# Patient Record
Sex: Male | Born: 1935
Health system: Southern US, Community
[De-identification: ages and names within clinical notes are randomized; demographics above are authoritative.]

## PROBLEM LIST (undated history)

## (undated) DIAGNOSIS — E875 Hyperkalemia: Secondary | ICD-10-CM

## (undated) DIAGNOSIS — K759 Inflammatory liver disease, unspecified: Secondary | ICD-10-CM

## (undated) DIAGNOSIS — I472 Ventricular tachycardia: Secondary | ICD-10-CM

## (undated) DIAGNOSIS — K2270 Barrett's esophagus without dysplasia: Secondary | ICD-10-CM

## (undated) DIAGNOSIS — R29898 Other symptoms and signs involving the musculoskeletal system: Secondary | ICD-10-CM

## (undated) DIAGNOSIS — K219 Gastro-esophageal reflux disease without esophagitis: Secondary | ICD-10-CM

## (undated) DIAGNOSIS — R7303 Prediabetes: Secondary | ICD-10-CM

## (undated) DIAGNOSIS — Z9289 Personal history of other medical treatment: Secondary | ICD-10-CM

## (undated) DIAGNOSIS — I10 Essential (primary) hypertension: Secondary | ICD-10-CM

## (undated) DIAGNOSIS — I951 Orthostatic hypotension: Secondary | ICD-10-CM

## (undated) DIAGNOSIS — E7800 Pure hypercholesterolemia, unspecified: Secondary | ICD-10-CM

## (undated) DIAGNOSIS — N183 Chronic kidney disease, stage 3 (moderate): Secondary | ICD-10-CM

## (undated) DIAGNOSIS — Z8719 Personal history of other diseases of the digestive system: Secondary | ICD-10-CM

## (undated) DIAGNOSIS — E042 Nontoxic multinodular goiter: Secondary | ICD-10-CM

## (undated) DIAGNOSIS — E0590 Thyrotoxicosis, unspecified without thyrotoxic crisis or storm: Secondary | ICD-10-CM

## (undated) DIAGNOSIS — B029 Zoster without complications: Secondary | ICD-10-CM

## (undated) DIAGNOSIS — Z8679 Personal history of other diseases of the circulatory system: Secondary | ICD-10-CM

## (undated) DIAGNOSIS — Z952 Presence of prosthetic heart valve: Secondary | ICD-10-CM

## (undated) DIAGNOSIS — J45909 Unspecified asthma, uncomplicated: Secondary | ICD-10-CM

## (undated) DIAGNOSIS — R0600 Dyspnea, unspecified: Secondary | ICD-10-CM

## (undated) DIAGNOSIS — I5042 Chronic combined systolic (congestive) and diastolic (congestive) heart failure: Secondary | ICD-10-CM

## (undated) DIAGNOSIS — M1990 Unspecified osteoarthritis, unspecified site: Secondary | ICD-10-CM

## (undated) DIAGNOSIS — Z7901 Long term (current) use of anticoagulants: Secondary | ICD-10-CM

## (undated) DIAGNOSIS — I2510 Atherosclerotic heart disease of native coronary artery without angina pectoris: Secondary | ICD-10-CM

## (undated) DIAGNOSIS — Z95810 Presence of automatic (implantable) cardiac defibrillator: Secondary | ICD-10-CM

## (undated) DIAGNOSIS — E785 Hyperlipidemia, unspecified: Secondary | ICD-10-CM

## (undated) DIAGNOSIS — I4819 Other persistent atrial fibrillation: Secondary | ICD-10-CM

## (undated) DIAGNOSIS — J189 Pneumonia, unspecified organism: Secondary | ICD-10-CM

## (undated) DIAGNOSIS — I420 Dilated cardiomyopathy: Secondary | ICD-10-CM

## (undated) DIAGNOSIS — E43 Unspecified severe protein-calorie malnutrition: Secondary | ICD-10-CM

## (undated) DIAGNOSIS — K922 Gastrointestinal hemorrhage, unspecified: Secondary | ICD-10-CM

## (undated) DIAGNOSIS — T8859XA Other complications of anesthesia, initial encounter: Secondary | ICD-10-CM

## (undated) DIAGNOSIS — J90 Pleural effusion, not elsewhere classified: Secondary | ICD-10-CM

## (undated) HISTORY — DX: Presence of prosthetic heart valve: Z95.2

## (undated) HISTORY — DX: Ventricular tachycardia: I47.2

## (undated) HISTORY — DX: Presence of automatic (implantable) cardiac defibrillator: Z95.810

## (undated) HISTORY — DX: Pure hypercholesterolemia, unspecified: E78.00

## (undated) HISTORY — PX: ESOPHAGOGASTRODUODENOSCOPY (EGD) WITH ESOPHAGEAL DILATION: SHX5812

## (undated) HISTORY — DX: Nontoxic multinodular goiter: E04.2

## (undated) HISTORY — PX: CARDIAC VALVE REPLACEMENT: SHX585

## (undated) HISTORY — PX: CATARACT EXTRACTION W/ INTRAOCULAR LENS  IMPLANT, BILATERAL: SHX1307

## (undated) HISTORY — DX: Other persistent atrial fibrillation: I48.19

## (undated) HISTORY — DX: Long term (current) use of anticoagulants: Z79.01

## (undated) HISTORY — DX: Zoster without complications: B02.9

## (undated) HISTORY — DX: Unspecified severe protein-calorie malnutrition: E43

## (undated) HISTORY — DX: Hyperlipidemia, unspecified: E78.5

## (undated) HISTORY — PX: AORTIC VALVE REPLACEMENT: SHX41

## (undated) HISTORY — DX: Other symptoms and signs involving the musculoskeletal system: R29.898

## (undated) HISTORY — DX: Orthostatic hypotension: I95.1

## (undated) HISTORY — DX: Atherosclerotic heart disease of native coronary artery without angina pectoris: I25.10

## (undated) HISTORY — DX: Dilated cardiomyopathy: I42.0

## (undated) HISTORY — DX: Personal history of other medical treatment: Z92.89

## (undated) HISTORY — DX: Essential (primary) hypertension: I10

## (undated) HISTORY — DX: Hyperkalemia: E87.5

## (undated) HISTORY — DX: Chronic kidney disease, stage 3 (moderate): N18.3

## (undated) HISTORY — PX: TONSILLECTOMY: SUR1361

## (undated) HISTORY — DX: Barrett's esophagus without dysplasia: K22.70

## (undated) HISTORY — DX: Personal history of other diseases of the circulatory system: Z86.79

## (undated) HISTORY — DX: Thyrotoxicosis, unspecified without thyrotoxic crisis or storm: E05.90

## (undated) HISTORY — DX: Pleural effusion, not elsewhere classified: J90

## (undated) HISTORY — DX: Gastrointestinal hemorrhage, unspecified: K92.2

## (undated) HISTORY — PX: APPENDECTOMY: SHX54

## (undated) HISTORY — DX: Chronic combined systolic (congestive) and diastolic (congestive) heart failure: I50.42

---
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
## 2000-05-27 ENCOUNTER — Encounter (INDEPENDENT_AMBULATORY_CARE_PROVIDER_SITE_OTHER): Payer: Self-pay | Admitting: Specialist

## 2000-05-27 ENCOUNTER — Ambulatory Visit (HOSPITAL_COMMUNITY): Admission: RE | Admit: 2000-05-27 | Discharge: 2000-05-27 | Payer: Self-pay | Admitting: *Deleted

## 2002-09-30 ENCOUNTER — Encounter (INDEPENDENT_AMBULATORY_CARE_PROVIDER_SITE_OTHER): Payer: Self-pay | Admitting: *Deleted

## 2002-09-30 ENCOUNTER — Ambulatory Visit (HOSPITAL_COMMUNITY): Admission: RE | Admit: 2002-09-30 | Discharge: 2002-09-30 | Payer: Self-pay | Admitting: *Deleted

## 2004-10-26 ENCOUNTER — Emergency Department (HOSPITAL_COMMUNITY): Admission: EM | Admit: 2004-10-26 | Discharge: 2004-10-27 | Payer: Self-pay | Admitting: Emergency Medicine

## 2006-04-21 ENCOUNTER — Emergency Department (HOSPITAL_COMMUNITY): Admission: EM | Admit: 2006-04-21 | Discharge: 2006-04-21 | Payer: Self-pay | Admitting: Emergency Medicine

## 2007-04-10 DIAGNOSIS — Z952 Presence of prosthetic heart valve: Secondary | ICD-10-CM

## 2007-04-10 HISTORY — DX: Presence of prosthetic heart valve: Z95.2

## 2008-02-19 ENCOUNTER — Encounter: Admission: RE | Admit: 2008-02-19 | Discharge: 2008-02-19 | Payer: Self-pay | Admitting: Cardiology

## 2008-02-23 ENCOUNTER — Inpatient Hospital Stay (HOSPITAL_BASED_OUTPATIENT_CLINIC_OR_DEPARTMENT_OTHER): Admission: RE | Admit: 2008-02-23 | Discharge: 2008-02-23 | Payer: Self-pay | Admitting: Cardiology

## 2008-02-23 ENCOUNTER — Ambulatory Visit (HOSPITAL_COMMUNITY): Admission: RE | Admit: 2008-02-23 | Discharge: 2008-02-23 | Payer: Self-pay | Admitting: Cardiology

## 2008-02-23 ENCOUNTER — Encounter (INDEPENDENT_AMBULATORY_CARE_PROVIDER_SITE_OTHER): Payer: Self-pay | Admitting: Cardiology

## 2008-02-23 ENCOUNTER — Ambulatory Visit: Payer: Self-pay | Admitting: Vascular Surgery

## 2008-02-27 ENCOUNTER — Ambulatory Visit: Payer: Self-pay | Admitting: Thoracic Surgery (Cardiothoracic Vascular Surgery)

## 2008-03-10 ENCOUNTER — Ambulatory Visit: Payer: Self-pay | Admitting: Thoracic Surgery (Cardiothoracic Vascular Surgery)

## 2008-03-10 ENCOUNTER — Inpatient Hospital Stay (HOSPITAL_COMMUNITY)
Admission: RE | Admit: 2008-03-10 | Discharge: 2008-03-22 | Payer: Self-pay | Admitting: Thoracic Surgery (Cardiothoracic Vascular Surgery)

## 2008-03-10 ENCOUNTER — Encounter: Payer: Self-pay | Admitting: Thoracic Surgery (Cardiothoracic Vascular Surgery)

## 2008-03-10 HISTORY — PX: CORONARY ARTERY BYPASS GRAFT: SHX141

## 2008-04-06 ENCOUNTER — Inpatient Hospital Stay (HOSPITAL_COMMUNITY): Admission: EM | Admit: 2008-04-06 | Discharge: 2008-04-08 | Payer: Self-pay | Admitting: Emergency Medicine

## 2008-04-12 ENCOUNTER — Ambulatory Visit: Payer: Self-pay | Admitting: Thoracic Surgery (Cardiothoracic Vascular Surgery)

## 2008-04-12 ENCOUNTER — Encounter
Admission: RE | Admit: 2008-04-12 | Discharge: 2008-04-12 | Payer: Self-pay | Admitting: Thoracic Surgery (Cardiothoracic Vascular Surgery)

## 2008-04-19 ENCOUNTER — Encounter
Admission: RE | Admit: 2008-04-19 | Discharge: 2008-04-19 | Payer: Self-pay | Admitting: Thoracic Surgery (Cardiothoracic Vascular Surgery)

## 2008-04-19 ENCOUNTER — Ambulatory Visit: Payer: Self-pay | Admitting: Thoracic Surgery (Cardiothoracic Vascular Surgery)

## 2008-04-23 ENCOUNTER — Ambulatory Visit: Payer: Self-pay | Admitting: Cardiothoracic Surgery

## 2008-04-23 ENCOUNTER — Encounter: Admission: RE | Admit: 2008-04-23 | Discharge: 2008-04-23 | Payer: Self-pay | Admitting: Cardiothoracic Surgery

## 2008-05-25 ENCOUNTER — Ambulatory Visit: Payer: Self-pay | Admitting: Thoracic Surgery (Cardiothoracic Vascular Surgery)

## 2008-05-25 ENCOUNTER — Encounter
Admission: RE | Admit: 2008-05-25 | Discharge: 2008-05-25 | Payer: Self-pay | Admitting: Thoracic Surgery (Cardiothoracic Vascular Surgery)

## 2010-08-22 NOTE — Assessment & Plan Note (Signed)
OFFICE VISIT   CHUKWUKA, FESTA  DOB:  1932-05-02                                        April 12, 2008  CHART #:  [id number]   REASON FOR OFFICE VISIT:  Status post AVR and CABG x3 on 03/10/2008 with  Dr. Roxan Hockey.   HISTORY OF PRESENTING ILLNESS:  This is a 77 year old Caucasian male  with a history of rheumatic fever as well as aortic stenosis,  hypertension, and hypercholesterolemia.  He underwent an AVR (23-Magna  Ease pericardial valve) and CABG x3 by Dr. Roxan Hockey on 03/10/2008.  The patient was discharged from the hospital.  He was seen in Dr.  Theodosia Blender office on 04/06/2008 where he had complaints of a productive  cough and fevers with worsening discomfort and chills as well as  shortness of breath.  He was readmitted to [facility] for further  evaluation and treatment.  A chest x-ray was done on 04/06/2008, which  showed a worsening left pleural effusion (moderate in size).  The  patient now underwent a thoracentesis.  The patient was then discharged  shortly thereafter.  The patient states his only complaint at this time  is feeling fatigued and weak.  He denies any cough, shortness of breath,  chest pain, fever, or chills.   PHYSICAL EXAMINATION:  GENERAL:  BP is 99/68, pulse rate 72, respiration  rate 16, and O2 sat 96% on room air.  CARDIOVASCULAR:  Regular rate and rhythm, S1 and S2.  No murmurs,  gallops, or rubs.  PULMONARY:  Slightly diminished at left base, but otherwise clear.  No  rales, wheezes, or rhonchi.  EXTREMITIES:  No cyanosis, clubbing, or edema.  WOUNDS:  Sternum clean and dry.  Right lower extremity wound clean and  dry.  A 2 or 3 chest tube incisions well healed.  The wound on the far  right is opened slightly with some clear ooze.  This was cleaned and a  dry gauze and tape were placed over wound.   Chest x-ray done today show a small residual left pleural effusion, the  previous infiltrate in the lingula has  resolved, improved atelectasis of  the left lower lobe.  Right lung is clear.   IMPRESSION AND PLAN:  The patient has a followup appointment to see Dr.  Radford Pax this afternoon.  The patient has been contacted by cardiac  rehabilitation.  He was instructed that from a surgical standpoint.  He  may participate in cardiac rehabilitation and was encouraged to do so.  In the meantime, he is to continue walking every day and increase his  frequency and duration as tolerates.  He was also instructed to remove  the dressing of the far right chest tube wound.  He was instructed he  may use a Band-Aid and change this daily into the wound if healed.  He  was instructed to call the office if there is any redness, increased  drainage, tenderness, fever, or chills.  The patient will return to see  Dr. Roxan Hockey in 1-2 weeks with a chest x-ray to evaluate left pleural  effusion and ensure that there has been no reaccumulation.  The patient  was also instructed he may begin driving short distances during the  daytime.  He was also instructed to not lift more than 10-15 pounds for  the next 2-3 weeks.  In addition, the patient is on Coumadin and Home  Health has been drawing this and Cardiology is following and adjusting  his Coumadin accordingly.   Revonda Standard Roxan Hockey, M.D.  Electronically Signed   DZ/MEDQ  D:  04/12/2008  T:  04/12/2008  Job:  [id number]

## 2010-08-22 NOTE — Discharge Summary (Signed)
NAMEELIAV, MECHLING NO.:  [id number]   MEDICAL RECORD NO.:  [id number]          PATIENT TYPE:  INP   LOCATION:  2032                         FACILITY:  [facility]   PHYSICIAN:  Revonda Standard. Roxan Hockey, M.D.DATE OF BIRTH:  Nov 29, 1932   DATE OF ADMISSION:  03/10/2008  DATE OF DISCHARGE:                               DISCHARGE SUMMARY   CHIEF COMPLAINT:  Chest discomfort and shortness of breath.   HISTORY OF PRESENT ILLNESS:  Mr. Torelli was admitted to the [facility] for a coronary artery bypass and graft after a 2-D  echocardiogram and cardiac catheterization showed severe aortic stenosis  and 2-vessel coronary artery disease.   PAST MEDICAL HISTORY:  Significant for,  1. Rheumatic fever.  2. Aortic stenosis.  3. Hypertension.  4. Hypercholesterolemia.  5. Gastroesophageal reflux disorder with Barrett esophagitis.  6. Peripheral neuropathy.  7. Hepatitis C.   PAST SURGICAL HISTORY:  Significant for cardiac catheterization.   SOCIAL HISTORY:  Mr. Frerking is married with children.  He is retired.  He does not smoke or use chewing tobacco, and uses occasional alcohol.   FAMILY MEDICAL HISTORY:  Significant for Alzheimer's on maternal side.   ADMISSION MEDICATIONS:  1. Lisinopril 40 mg daily.  2. Lasix 40 mg daily.  3. Omeprazole 20 mg daily.  4. Amlodipine 5 mg daily.  5. Aspirin 81 mg daily.  6. Magnesium supplement 250 mg daily.  7. Multivitamin daily.  8. Atenolol 100 mg at bedtime.  9. Lovastatin 40 mg at bedtime.   ALLERGIES:  PENICILLIN and NOVOCAIN.   HOSPITAL COURSE:  On March 10, 2008, Mr. Baird was admitted and sent  back to the operating room.  He had a median sternotomy and an aortic  valve replacement with a 23-mm Magna Ease pericardial valve.  He also  had a coronary artery bypass and grafting x3 with the left internal  mammary artery to the left anterior descending, the saphenous vein graft  to first diagonal, and  saphenous vein graft to right coronary artery.  All of this with an endoscopic vein harvesting of the right thigh.  Mr.  Sawyer was then sent back to PACU in stable condition.  During his  postoperative hospital stay, he became hypokalemic and was given  potassium chloride supplements.  This hypokalemia resolved and all of  his potassium chloride was discontinued.  He was also found to have  poor bowel movements and was put on laxatives.  His bowel movements  became stable and laxatives were discontinued.  Mr. Komar complained  of some nausea during his postoperative hospital stay, and was  prescribed Zofran as needed.  By postoperative day #8, he was not  nauseous anymore, but Zofran was still continued on an as needed basis.  Mr. Detloff also showed some acute renal insufficiency with BUN and  creatinine levels higher than normal.  These BUN and creatinine levels  down trended throughout his postoperative stay, coming back within  normal limits prior to discharge.  On postoperative day #4, Mr. Ziegler  went into some atrial fibrillation, it was converted with amiodarone.  He continued to have series of atrial fibrillation, each one converted  with amiodarone.  On postoperative #8, Coumadin therapy was instigated  as a prophylactic therapy.  Mr. Stocks blood pressure trended upward  during his postoperative stay, and his Lopressor medication dosage was  increased.  He also showed to have some volume overload postoperatively,  and he was diuresed with Lasix.  Throughout his postoperative stay, his  incisions remained clean and dry without any signs of infection.  Upon  discharge, the patient may wash these incisions with soap and water and  keep clean.  Mr. Kempfer pacing wires were to be removed on  postoperative #7, but due to his atrial fibrillation, they remained in  place.  The patient will have pacing wires removed prior to discharge  once atrial fibrillation has become  stable.   Labs prior to discharge include calcium 8.7, sodium 142, potassium 5.1,  bicarb 27, chloride 110, BUN 27, creatinine 1.45, and glucose 121.  PT  14.5 and INR 1.1.   DISPOSITION:  The patient is discharged in stable condition.   He is to have a followup appointment within 2 weeks with his  cardiologist, Dr. Radford Pax.  He also will have a physician assistant  appointment at [facility] office on April 12, 2008, at 1:30 p.m.  Prior to this, he is to go to [facility] imaging at  1 p.m. p.o. on the April 12, 2008, to have a chest x-ray done.  Due to  his Coumadin therapy, he will also need a PT and INR appointment after  his discharge.  This appointment will be scheduled by discharging  physician assistants.   DISCHARGE MEDICATIONS:  1. Lasix 40 mg p.o. daily.  2. Omeprazole 20 mg p.o. daily.  3. Aspirin 81 mg p.o. daily.  4. Magnesium supplement 250 mg p.o. daily.  5. Multivitamin 1 tablet p.o. daily.  6. Lovastatin 40 mg at bedtime p.o.  7. Amiodarone 200 mg p.o. 2 tablets twice a day.  8. Metoprolol 25 mg p.o. 1 tablet three times a day.  9. Coumadin dosage to be determined by discharging physician      assistant.  10.Oxycodone 5/325 mg p.o. 1-2 tablets every 4-6 hours as needed for      pain.   Dictated by Hassan Buckler, [employer] student.      Darlin Coco, PA      Revonda Standard. Roxan Hockey, M.D.  Electronically Signed    KMD/MEDQ  D:  03/19/2008  T:  03/20/2008  Job:  [id number]

## 2010-08-22 NOTE — Assessment & Plan Note (Signed)
OFFICE VISIT   [name], [name]  DOB:  1933/03/26                                        April 23, 2008  CHART #:  [id number]   CURRENT PROBLEMS:  1. Left pleural effusion, status post aortic valve      replacement/coronary artery bypass graft by Dr. Roxan Hockey on      March 10, 2008.  2. Short-term Coumadin therapy for aortic valve replacement with INR      this week 2.4.  3. Hepatitis C.   The patient returns for followup with chest x-ray of a postoperative  inflammatory left pleural effusion.  This required readmission to the  hospital shortly after his discharge for thoracentesis of a moderate-to-  large effusion.  He has been followed and chest x-ray with a mild  persistent left effusion.  He is on chronic Lasix therapy 40 mg a day.  He is not smoking and walks 500 yards several times a day.  He denies  cough or shortness of breath.  He has had no bleeding complications from  the Coumadin, and he has remained in a regular sinus rhythm.   PHYSICAL EXAMINATION:  VITAL SIGNS:  Blood pressure 120/70, pulse 72 and  regular, respirations 18, and saturation 97%.  CHEST:  The sternum is stable and well healed.  CARDIAC:  Rhythm is regular.  There is no significant murmur.  LUNGS:  Breath sounds are fairly equal and clear.  EXTREMITIES:  There is no peripheral pedal edema.   PA and lateral chest x-ray shows a persistent mild left effusion which  is loculated posteriorly.   IMPRESSION AND PLAN:  The patient has an asymptomatic mild left pleural  effusion postoperatively, and is currently on Coumadin.  I do not feel  that thoracentesis is indicated, and I have encouraged him to continue  increasing his activity level and use his incentive spirometer,  remain on Lasix and to avoid excess salt.  He will return to the office  in 3-4 weeks with a chest x-ray, but he should be fine.    [name], M.D.  Electronically Signed   PV/MEDQ  D:   04/23/2008  T:  04/23/2008  Job:  [id number]   cc:   [name], M.D.

## 2010-08-22 NOTE — Assessment & Plan Note (Signed)
OFFICE VISIT   DONALDSON, RICHTER D  DOB:  1932/10/16                                        May 25, 2008  CHART #:  [id number]   The patient returns in followup.  He had aortic valve replacement and  coronary artery bypass grafting x2 on March 10, 2008.  Postoperatively, he had some atrial fibrillation.  He was put on  Coumadin for a pericardial valve along with the atrial fibrillation.  He  developed a left pleural effusion.  He required a thoracentesis at one  point.  He subsequently was seen by Dr. Prescott Gum on April 23, 2008,  at which time, the effusion was smaller.  He continued his Lasix.  He  now returns for a 61-monthfollowup from that visit.  States in the  interim, he is starting to feel little better.  He is starting to get  little more energy, still does have some pain in his anterior left  chest.  He has never taken pain medication since he left the hospital.  Sternum itself is stable and not painful to him.  He states his energy  levels are still low, but have started to improve.   PHYSICAL EXAMINATION:  GENERAL:  The patient is a 77year old gentleman,  in no acute distress.  VITAL SIGNS:  His blood pressure is 157/82, pulse 72, respirations are  18.  His ox saturation is 100% on room air.  LUNGS:  Clear with equal breath sounds.  CARDIAC:  Has a regular rate and rhythm.  There is no murmur.  EXTREMITIES:  His pulse is regular.  He has trace peripheral edema.   Chest x-ray shows decreased size of a small left pleural effusion.   IMPRESSION:  The patient is doing well at this point in time.  He is now  about 10 weeks out from his surgery and starting to make more  significant progress.  Biggest question for him is his Coumadin.  He has  been on it long enough from the standpoint of his valve, but he did have  some atrial fibrillation around the time of surgery, so I am going to  defer to Dr. TRadford Pax  He has been maintaining sinus  rhythm, so I think  it is probably safe to stop that.  He has an appointment with her in a  weak or two, so he will continue to take the Coumadin until that time,  but certainly from the standpoint of the valve itself he could be on  aspirin alone at this point.  He otherwise is making good progress.  His  left pleural effusion is not large enough to warrant thoracentesis.  It  is smaller from the last exam, so I do not think there is any need for  continued followup with x-ray.  I did give him a low-dose prednisone  taper 25, tapering to 5 over 5 days, which might help eliminate the  small amount of remaining fluid.  At this point, he will be followed by  Dr. TRadford Paxand Dr. FMaceo Pro  I will be happy to see him back if I could be  of any further assistance with his care, but we will make him schedule a  followup appointment at this time.   SRevonda StandardHRoxan Hockey M.D.  Electronically Signed   SCH/MEDQ  D:  05/25/2008  T:  05/25/2008  Job:  [id number]   cc:   [name], M.D.  [place] Maceo Pro, M.D.

## 2010-08-22 NOTE — Assessment & Plan Note (Signed)
OFFICE VISIT   SELBY, FOISY D  DOB:  [birth year]                                        April 19, 2008  CHART #:  [id number]   The patient is status post AVR and coronary artery bypass grafting x3  done by Dr. Roxan Hockey on 03/10/2008.  The patient had to be readmitted  to [facility] on 04/06/2008 with worsening left pleural  effusion, moderate in size.  The patient noted to be on Coumadin and  this was held.  He underwent thoracentesis during this admission,  tolerated well, restarted on Coumadin, and discharged home in stable  condition.  He was last seen in the office on April 12, 2008, for  followup of his pleural effusion.  He presents today a week later for  repeat chest x-ray.  The patient states he is finally feeling better.  He is up ambulating well.  He denies incisional pain.  Denies shortness  of breath with ambulation or at rest.  Denies orthopnea, proximal  nocturnal dyspnea, nausea, vomiting, opening or drainage from any of his  incision sites.  He was seen at Dr. Theodosia Blender office for his PT/INR and  blood work today, which he states was 2.4.  He does not have a follow up  with Dr. Radford Pax for another 2 months.  He is ambulating 2000 feet a day.  He is tolerating diet well.   PHYSICAL EXAMINATION:  VITAL SIGNS:  Blood pressure 121/69, pulse 79,  respirations of 18, and O2 sats 97% on room air.  RESPIRATORY:  Diminished breath sounds at left base.  CARDIAC:  Regular rate and rhythm.  No murmurs, gallops, or rubs noted.  Sternum is stable.  ABDOMEN:  Benign.  EXTREMITIES:  No edema noted.  INCISIONS:  All clean, dry, intact, and healing well.   STUDIES:  The patient had PA and lateral chest x-ray done today, which  shows a slight increase in size and the patient's left pleural effusion.  No pneumothorax noted.   IMPRESSION AND PLAN:  The patient is status post aortic valve  replacement/coronary artery bypass graft.  He is  progressing well.  He  is noted to be complicated by left pleural effusion, which has increased  slightly in today's chest x-ray.  The patient is currently taking Lasix  40 mg daily.  We will continue the patient on Lasix 40 mg.  Due to  patient asymptomatic, we will recheck AP and lateral chest x-ray as well  as the left lateral decubitus on Friday and reevaluate at that time.  The patient is on Coumadin and his last INR was 2.4.  The patient is  told to continue ambulating as tolerated and using his incentive  spirometer.  The patient is told in the interim if he develops any  increasing shortness of breath, coughing, fevers, or chills, he is to  contact us.  The patient is understanding.   Revonda Standard Roxan Hockey, M.D.  Electronically Signed   KMD/MEDQ  D:  04/19/2008  T:  04/20/2008  Job:  [id number]   cc:   Revonda Standard. Roxan Hockey, M.D.  Dr. Radford Pax

## 2010-08-22 NOTE — Cardiovascular Report (Signed)
[name], [name]               ACCOUNT NO.:  [id number]   MEDICAL RECORD NO.:  [id number]          PATIENT TYPE:  OUT   LOCATION:  VASC                         FACILITY:  [facility]   PHYSICIAN:  [name], M.D.     DATE OF BIRTH:  1932/05/05   DATE OF PROCEDURE:  02/23/2008  DATE OF DISCHARGE:  02/23/2008                            CARDIAC CATHETERIZATION   PROCEDURE:  Right and left heart catheterization, coronary angiography,  and left ventriculography.   OPERATOR:  [name], MD   INDICATIONS:  Aortic stenosis.   COMPLICATIONS:  None.   IV MEDICATIONS:  Versed 1 mg and fentanyl 25 mcg.   This is a 77 year old male with a history of aortic stenosis, recently  found to have moderate LV dysfunction and now presents for cardiac  catheterization.   The patient was brought to the cardiac catheterization laboratory in a  fasting nonsedated state.  Informed consent was obtained.  The patient  was connected to continuous heart rate and pulse oximetry monitoring and  intermittent blood pressure monitoring.  The right groin was prepped and  draped in sterile fashion.  Xylocaine 1% was used for local anesthesia.  Using modified Seldinger technique, a 4-French sheath was placed in the  right femoral artery.  Using modified Seldinger technique, a 7-French  sheath was placed in the right femoral vein.  Under fluoroscopic  guidance, a 7-French Swan-Ganz catheter was placed under balloon  flotation at the right atrium.  Right atrial pressure and right atrial  O2 saturations were obtained.  The catheter was advanced into the right  ventricle and right ventricular pressure was obtained.  The catheter was  advanced into the pulmonary artery and then pulmonary capillary wedge  position where pulmonary capillary wedge pressure was measured.  The  balloon was deflated.  The catheter was pulled back into the main  pulmonary artery and pulmonary artery pressure along with pulmonary  artery  O2 saturations were obtained.  Cardiac outputs were then obtained  using 10 mL of saline on each injection x3.  The catheter was then  removed under fluoroscopic guidance.   A 4-French JL-4 catheter was placed in left coronary artery.  It could  not adequately engage the coronary ostium.  It was exchanged out for a 4-  [place] JL-5 catheter which successfully engaged the coronary ostium.  Multiple cine films were taken at 30-degree RAO and 40-degree LAO views.  This catheter was then exchanged out over a guidewire for a 4-French 3D  RCA catheter which successfully engaged the right coronary artery.  Multiple cine films were taken at 30-degree RAO and 40-degree LAO views.  This catheter was then exchanged out over a guidewire for a 4-French  angled pigtail catheter which was placed under fluoroscopic guidance in  the descending of the aortic valve but could not pass across the valve  due to heavy calcification.  The catheter was then removed over a  guidewire.  At the end of the procedure, all catheters and sheaths were  removed.  Manual compression was performed until adequate hemostasis was  obtained.  The patient was  transferred back to room in stable condition.   RESULTS:  1. The left main coronary artery is widely patent but very short and      immediately bifurcates into left anterior descending artery and      left circumflex artery.   1. The left anterior descending artery is calcified throughout its      proximal portion.  It gives rise to a first diagonal.  At the      takeoff of the first diagonal, there is a 50-70% stenosis of the      LAD involving the first diagonal with a 70% stenosis at the ostium      of the first diagonal.  The ongoing LAD is widely patent and      traverses to the apex.   1. The left circumflex is widely patent giving rise to a very large      obtuse marginal branch which bifurcates into 2 daughter branches      that does have luminal  irregularities.   1. The right coronary artery has a 70% proximal to mid stenosis and      then bifurcates distally in a posterior descending artery and      posterior lateral artery, both of which are widely patent.   RIGHT HEART CATH DATA:  The right atrial pressure is 9/5 with a mean of  4 mmHg.  RV pressure 44/7 with a mean of 10 mmHg.  PA pressure 44/19  with a mean of 30 mmHg.  Pulmonary capillary wedge pressure 24 mmHg.  Cardiac output by Fick 3.5 and by thermal dilution 3.5.  Cardiac index  by Fick 1.6 and by thermal dilution 1.6.   O2 saturations:  PA 62%, RA 62%, and aortic 97%.   ASSESSMENT:  1. Two-vessel borderline obstructive coronary artery disease.  2. Moderate left ventricular dysfunction by echo, ejection fraction      30%.  3. Severe aortic stenosis by echo.  During cath could not cross the      aortic valve.  Of note, a 2-D echocardiogram only showed a valve      area of 1.3 cm2 but in the setting of very low left ventricular      function, the valve area is overestimated by echo and by      visualization of the valve, there is severe aortic stenosis.  4. Mild pulmonary hypertension.   PLAN:  Discharged to home with outpatient followup with [facility] for aortic  valve replacement with CABG.      [name], M.D.  Electronically Signed     TT/MEDQ  D:  02/24/2008  T:  02/25/2008  Job:  [id number]

## 2010-08-22 NOTE — H&P (Signed)
NAMEHILL, MACKIE NO.:  [id number]   MEDICAL RECORD NO.:  [id number]          PATIENT TYPE:  INP   LOCATION:  2019                         FACILITY:  [facility]   PHYSICIAN:  Revonda Standard. Roxan Hockey, M.D.DATE OF BIRTH:  Jun 15, 1932   DATE OF ADMISSION:  04/06/2008  DATE OF DISCHARGE:                              HISTORY & PHYSICAL   CHIEF COMPLAINT:  Chest discomfort and shortness of breath.   HISTORY OF PRESENT ILLNESS:  Mr. Shein is a 77 year old gentleman who  underwent coronary bypass grafting and aortic valve replacement with a  pericardial valve on March 10, 2008.  His postop course was notable  for some atrial fibrillation as well as rather severe nausea.  He was  discharged to home on March 19, 2008.  He did well for several days,  but on Friday which was Christmas, he noted increasing shortness of  breath with exertion and also felt like he had some chills and general  malaise.  He denied any fever.  He also had a cough which has been  worsening since that time.  He was seen in Dr. Theodosia Blender office today and  found to have a moderate left effusion and was sent to the emergency  department.   PAST MEDICAL HISTORY:  1. Coronary artery disease.  2. Aortic stenosis status post AVR and CABG.  3. Congestive heart failure.  4. Peripheral neuropathy.  5. Postoperative atrial fibrillation.  6. Hypertension.  7. Hyperlipidemia.  8. Gastroesophageal reflux.  9. Hepatitis C.   MEDICATIONS:  1. Lasix 40 mg daily.  2. Aspirin 81 mg daily.  3. Coumadin daily.  4. Atenolol 100 mg daily.  5. Amlodipine 5 mg daily.  6. Lisinopril 20 mg daily.  7. Lovastatin 40 mg daily.  8. Prilosec 20 mg daily.  9. Multivitamin once daily.   ALLERGIES:  To PENICILLIN and NOVOCAIN, although he did receive  lidocaine intraoperatively without difficulty during his last admission.   FAMILY HISTORY:  Noncontributory.   SOCIAL HISTORY:  He uses occasional ethanol.  He does  not use tobacco.  He has good family support.   REVIEW OF SYSTEMS:  See HPI, otherwise notable for continued poor  appetite and states the food tastes slightly metallic.   PHYSICAL EXAMINATION:  GENERAL:  Mr. [name] is an anxious-appearing 70-  year-old white male in no acute distress.  VITAL SIGNS:  He is afebrile.  His blood pressure is 177/101, pulse 68  in sinus rhythm, respirations are 18.  NEUROLOGIC:  He is alert and oriented x3 with no focal deficits.  HEENT:  Unremarkable.  He does have nasal cannula oxygen.  NECK:  Without jugular venous distention.  CHEST:  His sternal incision is well-healed.  His sternum is stable.  CARDIAC:  He has a regular rate and rhythm.  Normal S1 and S2.  There is  no rub or murmur.  LUNGS:  Absent breath sounds at the left base with dullness to  percussion.  ABDOMEN:  Soft and nontender.  EXTREMITIES:  Without clubbing or cyanosis.  He does have trace pitting  edema below the  knee bilaterally.   LABORATORY DATA:  Chest x-ray shows a moderately large left pleural  effusion.  His white count is 8.1, hematocrit 38, and platelets 328.  INR is 3.4.  Sodium 134, potassium 4.2, BUN and creatinine are 17 and  1.58, and glucose is 137.  His baseline creatinine pre-admission on  March 08, 2008 was 1.48.  EKG shows sinus rhythm with a left bundle.  His cardiac enzymes, CK-MB was less than 1 and troponin was less than  0.05.   IMPRESSION:  Mr. Tolson is a 77 year old gentleman.  He is status post  aortic valve with coronary bypass grafting on March 10, 2008.  He has  a moderately large left pleural effusion which is symptomatic and I  suspect this is responsible for the majority of symptoms including his  cough, his shortness of breath, and his general malaise.  This is not  all in common after coronary bypass grafting, particularly with use of  left internal mammary artery.  He needs a thoracentesis.  Unfortunately,  his INR this evening is 3.4,  which I think is a little too high to have  an adequate margin of safety.  We will plan to give him 2 mg of Coumadin  intravenously.  He was on Coumadin primarily because of postoperative  atrial fibrillation.  He currently is in sinus rhythm.  Even if we  overcorrect his Coumadin slightly, it should not be an issue as he does  have a tissue valve in place.  We will plan to proceed with a left  thoracentesis in the morning if his INR is corrected to an acceptable  level, does not have to be normal, but certainly less than 2.5 for an  adequate margin of safety.   We will also treat him with a prednisone taper and increase his Lasix  dose to b.i.d. for a short period of time.   I discussed with Mr. Thomley the indications, risks, benefits, and  alternatives for left thoracentesis.  He understands the risks of  bleeding and the need to correct his INR prior to the procedure to try  to decrease the risk.      Revonda Standard Roxan Hockey, M.D.  Electronically Signed     SCH/MEDQ  D:  04/06/2008  T:  04/07/2008  Job:  [id number]   cc:   [name], M.D.  Clare Maceo Pro, M.D.

## 2010-08-22 NOTE — Op Note (Signed)
[name], [name] NO.:  [id number]   MEDICAL RECORD NO.:  [id number]          PATIENT TYPE:  INP   LOCATION:  2314                         FACILITY:  [facility]   PHYSICIAN:  Revonda Standard. Roxan Hockey, M.D.DATE OF BIRTH:  25-Nov-1932   DATE OF PROCEDURE:  03/10/2008  DATE OF DISCHARGE:                               OPERATIVE REPORT   PREOPERATIVE DIAGNOSES:  Two-vessel coronary artery disease and aortic  stenosis.   POSTOPERATIVE DIAGNOSES:  Two-vessel coronary artery disease and aortic  stenosis.   PROCEDURES:  1. Median sternotomy, extracorporeal circulation, aortic valve      replacement with 23-mm Magna Ease pericardial valve, model number      [id number], serial number [id number].  2. Coronary artery bypass grafting x3 (left internal mammary artery to      left anterior descending, saphenous vein graft to first diagonal,      saphenous vein graft to right coronary artery) endoscopic vein      harvest right thigh.   SURGEON:  Revonda Standard. Roxan Hockey, MD   ASSISTANT:  Faylene Million. Dominick, PA   ANESTHESIA:  General.   FINDINGS:  Tricuspid calcific stenotic aortic valve, good-quality  coronary targets, and good-quality conduits.   CLINICAL NOTE:  Mr. Mealey is a 77 year old gentleman with exertional  chest pain and shortness of breath.  He has moderately severe aortic  stenosis by echocardiogram which could be underestimated based on  impaired left ventricular function.  He also has significant two-vessel  coronary artery disease.  He was advised to undergo aortic valve  replacement as well as coronary artery bypass grafting.  The  indications, risks, benefits, and alternatives were discussed in detail  with the patient.  The advantages and disadvantages of mechanical tissue  valves were discussed with the patient as well and wished to proceed  with tissue valve placement.  Mr. Meunier understood the risks and  benefits of surgery and agreed to proceed.   OPERATIVE NOTE:  Mr. Cross was brought to the preop holding area on  March 10, 2008.  There the anesthesia service placed, lines were  monitoring arterial, central venous, and pulmonary arterial pressure.  Intravenous antibiotics were administered.  The patient was taken to the  operating room, anesthetized, and intubated.  Transesophageal  echocardiography was performed by Dr. Fulton Reek.  Please see his  separately dictated note for details.  The patient was noted to have a  calcific stenotic tricuspid aortic valve.  There was mild mitral  dysfunction.  His left ventricular function appeared moderately impaired  with ejection fraction estimated at 40%.   The chest, abdomen, and legs were prepped and draped in usual fashion.  An incision was made in the medial aspect of the right leg at the level  of the knee.  The greater saphenous vein was harvested from the right  thigh endoscopically, saphenous vein was good quality.  Simultaneously,  a median sternotomy was performed and the left internal mammary artery  was harvested using standard technique.  The patient was fully  heparinized prior to inserting the distal end of the left mammary  artery.  The mammary artery was a good-quality conduit.   The pericardium was opened.  The ascending aorta was inspected.  It was  of normal size.  There was no atherosclerotic disease.  The aorta was  cannulated via concentric 2 Ethibond pledgeted pursestring sutures.  A  dual stage venous cannula was placed via pursestring suture in the right  atrial appendage.  Cardiopulmonary bypass was instituted and the patient  was cooled to 28 degrees Celsius.  The coronary arteries were inspected  and anastomotic sites were chosen.  The conduits were inspected and cut  to length.  A left ventricular vent was placed via pursestring suture in  the right superior pulmonary vein.  A retrograde cardioplegic cannula  was placed via pursestring suture in  the right atrium and directed into  the coronary sinus.  Antegrade cardioplegic cannula was placed in the  ascending aorta.   The aorta was cross-clamped.  The left ventricle was emptied via both  vents.  Cardiac arresting was achieved with combination of cold  antegrade and retrograde blood cardioplegia and topical iced saline.  Initial cardioplegia with antegrade cardioplegia resulted in a rapid  diastolic arrest.  Retrograde cardioplegia then was administered to  complete septal cooling to less than 10 degrees Celsius.   A reversed saphenous vein graft was placed end-to-side to the distal  right coronary artery.  This was a 1.5-mm good-quality target vessel.  The vein was of good quality.  The anastomosis was performed end-to-side  with a running 7-0 Prolene suture.  At the completion of each vein,  distal anastomosis was probed proximally and distally to ensure patency.  Cardioplegia then was administered at the completion of each vein graft  anastomosis to assess flow and hemostasis.   Next, a reversed saphenous vein graft was placed end-to-side to the  first diagonal branch to the LAD, 70% proximal stenosis.  There was a 2-  mm good-quality target.  The vein was of good quality.  The anastomosis  was performed with a running 7-0 Prolene suture.  There was good flow  and good hemostasis.   Next, the left internal mammary artery was brought through the window  and the pericardium was cut to length.  The distal end was beveled and  was anastomosed end-to-side to the LAD.  The LAD was a 2-mm good-quality  target.  The mammary was 2.5-mm good-quality conduit.  Anastomosis was  performed with a running 8-0 Prolene suture.  At the completion of  anastomosis, the bulldog clamp was briefly removed to inspect for  hemostasis.  Immediate rapid septal rewarming was noted.  Bulldog clamp  was replaced and the mammary pedicle was tacked to the epicardial  surface of the harvest with 6-0  Prolene sutures.  Additional  cardioplegia was administered via the vein grafts as well as via the  retrograde catheter.  An aortotomy was performed just above the  sinotubular junction.  The aortic valve was inspected.  It was a  tricuspid calcified valve, which was stenotic.  There was moderate  annular calcification.  The valve leaflets were excised.  The annulus  was debrided taking care to remove all calcific particles.  After  debriding the annulus, it was copiously irrigated with iced saline,  inspected to ensure that there was no loose debris.  The annulus then  sized for a  23-mm Magna Ease pericardial tissue valve.  The valve was  prepared per manufacturer's recommendations.  2-0 Ethibond horizontal  mattress sutures with subannular pledgets were  placed circumferentially  around the annulus, total of 18 sutures were utilized, sutures then were  passed through the sewing ring of the valve.  The valve was lowered into  place and the sutures were sequentially tied.  The valve leaflets were  gently separated to assist seating, which was excellent.  The annulus  was probed with a fine tipped right angle to ensure that there were no  gaps.  Coronary ostia were not impinged, compromised by the valve.  Rewarming was begun.  The aortotomy then was closed in 2 layers with a  running 4-0 Prolene suture.  The first layer was a running horizontal  mattress suture followed by a running simple suture.   The vein grafts were cut to length.  The cardioplegic cannula was  removed from the ascending aorta.  The proximal vein graft anastomoses  were performed to 4.5-mm punch aortotomies with running 6-0 Prolene  sutures.  At completion of the final proximal anastomosis, the patient  was placed in Trendelenburg position.  Lidocaine was administered.  A  warm dose of retrograde cardioplegia was administered.  De-airing was  performed.  The bulldog clamps were removed from the left mammary  artery.   The aortic crossclamp was removed.  Total crossclamp time was  128 minutes.   While the patient was being rewarmed, all proximal and distal  anastomoses and aortotomy were inspect for hemostasis.  Epicardial  pacing wires were placed on the right ventricle and right atrium.  The  patient did require single defibrillation, then was bradycardic.  DDD  pacing was initiated once the pacing wires were placed.  A low-dose  dopamine infusion was initiated 3 mcg/kg/minute.  When the patient  rewarmed to core temperature of 37 degrees Celsius, he was weaned from  cardiopulmonary bypass on the first attempt.  Postbypass transesophageal  echocardiography revealed no change in moderate left ventricular  dysfunction.  There was good mobility of the prosthetic valve leaflets.  There were no perivalvular leaks.  The initial cardiac index was greater  than 2 liters/minute/meter squared.  The patient remained  hemodynamically stable throughout the postbypass period.   A test dose protamine was administered and was well tolerated.  The  atrial and aortic cannulae were removed.  Of note, the retrograde  cardioplegic cannula and left ventricular vent were then removed prior  to weaning from bypass.  Remainder of the protamine was administered  without incident.  The chest was irrigated with 1 liter of warm normal  saline containing 1 g of vancomycin.  Hemostasis was achieved.  The left  pleural and 2 mediastinal chest tubes were placed in separate subcostal  incisions.  The pericardium was reapproximated with interrupted 3-0 silk  sutures that could not get in easily without tension. The sternum was  closed with combination of single and double heavy gauge stainless steel  wires.  Pectoralis fascia, subcutaneous tissue, and skin were closed in  standard fashion.  All sponge, needle, and instrument counts were  correct at the end of the procedure.  There were no intraoperative  complications.  The patient  was taken from the operating room to the  surgical intensive care unit, intubated in stable condition.      Revonda Standard Roxan Hockey, M.D.  Electronically Signed     SCH/MEDQ  D:  03/10/2008  T:  03/10/2008  Job:  [id number]   cc:   [name], M.D.  [place] Maceo Pro, M.D.

## 2010-08-22 NOTE — Discharge Summary (Signed)
[name], [name] NO.:  [id number]   MEDICAL RECORD NO.:  [id number]          PATIENT TYPE:  INP   LOCATION:  2019                         FACILITY:  [facility]   PHYSICIAN:  Revonda Standard. Roxan Hockey, M.D.DATE OF BIRTH:  February 03, 1933   DATE OF ADMISSION:  04/06/2008  DATE OF DISCHARGE:  04/08/2008                               DISCHARGE SUMMARY   HISTORY:  The patient is a 77 year old gentleman who underwent coronary  artery bypass grafting and aortic valve replacement with a pericardial  valve on March 10, 2008.  His postop course was notable for some  atrial fibrillation as well as some rather severe nausea.  He was  discharged in stable condition on March 19, 2008.  He did well for  several days, but on Christmas, he noted increasing shortness of breath  with exertion as well as some chills and general malaise.  He denied  fever.  He had a cough, which was worsening since that time.  He was  seen by Dr. Radford Pax on April 06, 2008, and found to have a moderate  left effusion and was sent to the emergency department.   PAST MEDICAL HISTORY:  1. Coronary artery disease.  2. Aortic stenosis, status post AVR and CABG.  3. Congestive heart failure.  4. Peripheral neuropathy.  5. Postoperative atrial fibrillation.  6. Hypertension.  7. Hyperlipidemia.  8. Gastroesophageal reflux.  9. History of hepatitis C.   MEDICATIONS PRIOR TO ADMISSION:  1. Lasix 40 mg daily.  2. Aspirin 81 mg daily.  3. Coumadin daily.  4. Atenolol 100 mg daily.  5. Amlodipine 5 mg daily.  6. Lisinopril 20 mg daily.  7. Lovastatin 40 mg daily.  8. Prilosec 20 mg daily.  9. Multivitamin once daily.   ALLERGIES:  PENICILLIN and NOVOCAIN, although he did receive lidocaine  intraoperatively without difficulty during the last admission.   Family history, social history, review of symptoms, and physical exam  please see history and physical done at time of admission.   HOSPITAL COURSE:   The patient was admitted to Dr. Leonarda Salon service.  INR was checked and it was noted to be 3.4.  Cardiac isoenzymes were  negative.  Electrolytes were within normal limits.  His BUN and  creatinine was measured at 17 and 1.58 respectively.  Due to the  elevated INR, the patient was felt to require a little time interval  prior to proceeding with thoracentesis, but on April 07, 2008, he was  felt to be acceptable.  His INR on that date was 2.0 and Dr. Roxan Hockey  performed a left thoracentesis.  He obtained 2 liters of serous fluid.   POSTPROCEDURE COURSE:  The patient continued to do well.  His followup x-  ray revealed only a very small left effusion.  There was no  pneumothorax.  His heart rate was noted to be in the 40s-60s range.  His  atenolol was decreased, but overall on April 08, 2008, he is felt to  be quite acceptable for discharge at that time.   MEDICATIONS AT DISCHARGE:  1. Lasix 40 mg twice a  day for 1 week, then 40 mg daily.  2. Omeprazole 20 mg daily.  3. Aspirin 81 mg daily.  4. Magnesium supplement 250 mg daily.  5. Multivitamin 1 daily.  6. Lovastatin 40 mg daily.  7. Oxycodone 5/325 one to two every 4-6 hours as needed.  8. Prednisone 10 mg on April 09, 2008, and 5 mg on April 10, 2008,      then stop.  9. Potassium chloride 20 mEq twice a day for 1 week, then 20 mEq      daily.  10.Lisinopril 40 mg daily.  11.Norvasc 10 mg daily.  12.Atenolol 50 mg nightly.  13.Zithromax 250 mg once daily for 4 days.  14.Coumadin as taken previously and monitored by Cardiology.   Followup was arranged for the patient to see Dr. Roxan Hockey on April 20, 2007, at 1:30.   CONDITION ON DISCHARGE:  Stable and improved.   FINAL DIAGNOSES:  Large left pleural effusion, requiring thoracentesis  following aortic valve replacement and aortocoronary bypass grafting.   OTHER DIAGNOSES:  1. Congestive heart failure.  2. Peripheral neuropathy.  3. Hypertension.  4.  Hyperlipidemia.  5. Gastroesophageal reflux.  6. History of hepatitis C.  7. Postoperative atrial fibrillation.      John Giovanni, P.A.-C.      Revonda Standard Roxan Hockey, M.D.  Electronically Signed    WEG/MEDQ  D:  05/18/2008  T:  05/19/2008  Job:  [id number]   cc:   Revonda Standard. Roxan Hockey, M.D.

## 2010-08-22 NOTE — H&P (Signed)
HISTORY AND PHYSICAL EXAMINATION   February 27, 2008   Re:  ICHAEL, PULLARA         DOB:  1933-01-06   The patient is a 77 year old gentleman sent for consultation regarding  aortic stenosis and coronary artery disease.   HISTORY OF PRESENT ILLNESS:  The patient is a 77 year old gentleman with  a history of rheumatic fever in 1957 as well as aortic stenosis,  hypertension, and hypercholesterolemia.  He recently has noted onset of  exertional chest discomfort and shortness of breath that he first  noticed it when he was walking up a hill, it was relieved with rest.  He  presented by Dr. Maceo Pro to have a heart murmur and 2-D echocardiogram was  performed.  This showed significant left ventricular dysfunction with an  ejection fraction of 20%-30%.  He had mildly thickened mitral valve with  moderate mitral annular calcification, mild mitral regurgitation.  His  aortic valve was severely calcified with moderate-to-severe aortic  stenosis.  His mean gradient was 27 mmHg, peak gradient 51 mmHg with an  aortic valve area 1.24 cm2, however, it is felt that this may be  underestimating the severity of his aortic stenosis because of his left  ventricular dysfunction.  He had a sestamibi stress study, which showed  some attenuation in the inferior region of myocardium which was felt to  be due to the diaphragm.  He subsequently had cardiac catheterization by  Dr. [name], which showed 70% bifurcation lesion in the LAD  involving the takeoff of the first diagonal and 70% stenosis in a small  right coronary artery.  His cardiac index was 1.6 by both thermodilution  and Fick.  His PA pressures were 44/19, wedge was 24.  She was unable to  get a catheter across the aortic valve to directly measure the  gradients.   PAST MEDICAL HISTORY:  Significant for rheumatic fever in 1957,  hypertension, hypercholesterolemia, gastroesophageal reflux with Barrett  esophagus, peripheral  neuropathy affecting his feet, hepatitis C,  hypertension, and hypercholesterolemia.   CURRENT MEDICATIONS:  Aspirin 81 mg daily, atenolol 100 mg daily, Lasix  40 mg daily, omeprazole 20 mg daily, amlodipine 5 mg daily, multivitamin  daily, lovastatin 40 mg daily, lisinopril 40 mg daily, and alprazolam  0.25 mg t.i.d. p.r.n.   An allergy to penicillin and Novocain.   FAMILY HISTORY:  Significant for Alzheimer in his mother at age 1.  His  father died in a car accident.   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  He has 4 children.  He is married.  His  wife recently had back surgery.  He does not smoke.  He does drink  alcohol moderately.   REVIEW OF SYSTEMS:  Chest tightness, heart murmur, occasional  nosebleeds, some difficulty swallowing, and hoarseness.   PHYSICAL EXAMINATION:  GENERAL:  The patient is a 77 year old white male  in no acute distress.  NEUROLOGIC:  His affect is flat, but no focal neurologic deficits.  VITAL SIGNS:  His blood pressure is 128/66, pulse 58, respirations are  18, and oxygen saturation is 98% on room air is noted. HEENT:  Unremarkable.  He does have some missing teeth and fair dentition.  NECK:  Transmitted murmur.  CARDIAC:  Slightly irregular with a 3/6 crescendo-decrescendo murmur  heard throughout the precordium.  LUNGS:  Clear with equal breath sounds.  ABDOMEN:  Soft and nontender.  EXTREMITIES:  Without clubbing, cyanosis, or edema.  He has 2+ pulses  throughout.   LABORATORY DATA:  Cardiac catheterization, echocardiogram as previously  noted.  His carotid Dopplers were normal.  ABIs were normal.  His sodium  is 140, potassium 5.3, BUN and creatinine were 20 and 1.7.  On February 19, 2008, his lisinopril was discontinued and on February 23, 2008, his  creatinine was 1.46.  White count 10.1, hematocrit 48, and platelets  282.  Chest x-ray shows some peribronchial thickening,  but no active  disease.  EKG shows sinus bradycardia with frequent PVCs.  There were  some lateral ST changes.   IMPRESSION:  The patient is a 77 year old gentleman with exertional  chest pain and shortness of breath.  He has been found to have  moderately severe aortic stenosis which may well be underestimated by  echocardiogram based on velocities as he does have significant left  ventricular dysfunction with an ejection fraction of about 30%.  The  fact that Dr. Radford Pax was unable to get a catheter across the valve which  suggested is actually much tighter than 1.2 cm.  He also has two-vessel  coronary artery disease with a LAD diagonal lesion and a right coronary  artery lesion.  Aortic valve replacement and coronary artery bypass  graft is indicated for both survival benefit as well as relief of  symptoms.  I had a long discussion with the patient's family, multiple  family members were present including two daughters.  We discussed the  indications, risks, benefits, and alternatives.  We discussed the nature  and extent of the operation, expected hospital stay, and overall  recovery.  They understand the risks include but not limited to death,  stroke, myocardial infarction, deep vein thrombosis, pulmonary embolism,  bleeding, possible need for transfusions, infections as well as other  organ system dysfunction including respiratory, renal, hepatic, or GI  complications, and complete heart block potentially requiring permanent  pacemaker placement.  They had many questions related to the conduct of  the operation as well as his recovery, those were answered to the best  of my ability.  We did discuss valve options including mechanical valve  with need for lifelong anticoagulation.  Given his age is greater than  71, we recommended tissue valve to avoid the need for lifelong  anticoagulation.  The longevity of the valve in his age group is  excellent.  He does wish to proceed with a  tissue valve.   The patient does need to see a dentist.  He had 2 teeth removed a year  ago and he has not seen a dentist since that time, so I would like him  to see a dentist prior to surgery if at all possible.  We will  tentatively plan for surgery on Wednesday, March 10, 2008.  He will be  admitted on the day of surgery.   Revonda Standard Roxan Hockey, M.D.  Electronically Signed   SCH/MEDQ  D:  02/27/2008  T:  02/28/2008  Job:  [id number]   cc:   [name], M.D.  Alderton Maceo Pro, M.D.

## 2011-01-09 LAB — COMPREHENSIVE METABOLIC PANEL
AST: 24 U/L (ref 0–37)
Albumin: 3.9 g/dL (ref 3.5–5.2)
BUN: 24 mg/dL — ABNORMAL HIGH (ref 6–23)
Calcium: 9.5 mg/dL (ref 8.4–10.5)
GFR calc Af Amer: 56 mL/min — ABNORMAL LOW (ref >60)
GFR calc non Af Amer: 47 mL/min — ABNORMAL LOW (ref >60)
Glucose, Bld: 112 mg/dL — ABNORMAL HIGH (ref 70–99)
Potassium: 4.7 mEq/L (ref 3.5–5.1)
Total Protein: 6.2 g/dL (ref 6.0–8.3)

## 2011-01-09 LAB — POCT I-STAT 3, ART BLOOD GAS (G3+)
O2 Saturation: 97
pCO2 arterial: 33.6 — ABNORMAL LOW
pH, Arterial: 7.419
pO2, Arterial: 88

## 2011-01-09 LAB — TYPE AND SCREEN
ABO/RH(D): A NEG
Antibody Screen: NEGATIVE

## 2011-01-09 LAB — POCT I-STAT 3, VENOUS BLOOD GAS (G3P V)
Acid-base deficit: 2
Bicarbonate: 23.5
O2 Saturation: 62
TCO2: 25
pCO2, Ven: 40.6 — ABNORMAL LOW
pCO2, Ven: 41.1 — ABNORMAL LOW
pH, Ven: 7.366 — ABNORMAL HIGH
pH, Ven: 7.372 — ABNORMAL HIGH
pO2, Ven: 35

## 2011-01-09 LAB — HEMOGLOBIN A1C: Mean Plasma Glucose: 128 mg/dL

## 2011-01-09 LAB — URINALYSIS, ROUTINE W REFLEX MICROSCOPIC
Hgb urine dipstick: NEGATIVE
Ketones, ur: NEGATIVE mg/dL
Specific Gravity, Urine: 1.021 (ref 1.005–1.030)

## 2011-01-09 LAB — BLOOD GAS, ARTERIAL
O2 Saturation: 97.8 %
TCO2: 24.1 mmol/L (ref 0–100)
pCO2 arterial: 33.7 mmHg — ABNORMAL LOW (ref 35.0–45.0)
pO2, Arterial: 104 mmHg — ABNORMAL HIGH (ref 80.0–100.0)

## 2011-01-09 LAB — BASIC METABOLIC PANEL
Calcium: 9.1
Creatinine, Ser: 1.46
GFR calc Af Amer: 57 — ABNORMAL LOW
GFR calc non Af Amer: 47 — ABNORMAL LOW
Sodium: 138

## 2011-01-09 LAB — CBC
HCT: 43.8 % (ref 39.0–52.0)
Hemoglobin: 14.5 g/dL (ref 13.0–17.0)
MCHC: 33.1 g/dL (ref 30.0–36.0)
MCV: 88 fL (ref 78.0–100.0)
WBC: 9.8 10*3/uL (ref 4.0–10.5)

## 2011-01-09 LAB — ABO/RH: ABO/RH(D): A NEG

## 2011-01-11 LAB — CULTURE, BLOOD (ROUTINE X 2): Culture: NO GROWTH

## 2011-01-11 LAB — GLUCOSE, CAPILLARY
Glucose-Capillary: 106 mg/dL — ABNORMAL HIGH (ref 70–99)
Glucose-Capillary: 106 mg/dL — ABNORMAL HIGH (ref 70–99)
Glucose-Capillary: 114 mg/dL — ABNORMAL HIGH (ref 70–99)
Glucose-Capillary: 122 mg/dL — ABNORMAL HIGH (ref 70–99)
Glucose-Capillary: 123 mg/dL — ABNORMAL HIGH (ref 70–99)
Glucose-Capillary: 129 mg/dL — ABNORMAL HIGH (ref 70–99)
Glucose-Capillary: 131 mg/dL — ABNORMAL HIGH (ref 70–99)
Glucose-Capillary: 132 mg/dL — ABNORMAL HIGH (ref 70–99)
Glucose-Capillary: 138 mg/dL — ABNORMAL HIGH (ref 70–99)
Glucose-Capillary: 140 mg/dL — ABNORMAL HIGH (ref 70–99)
Glucose-Capillary: 143 mg/dL — ABNORMAL HIGH (ref 70–99)
Glucose-Capillary: 144 mg/dL — ABNORMAL HIGH (ref 70–99)
Glucose-Capillary: 148 mg/dL — ABNORMAL HIGH (ref 70–99)
Glucose-Capillary: 150 mg/dL — ABNORMAL HIGH (ref 70–99)
Glucose-Capillary: 150 mg/dL — ABNORMAL HIGH (ref 70–99)
Glucose-Capillary: 155 mg/dL — ABNORMAL HIGH (ref 70–99)
Glucose-Capillary: 159 mg/dL — ABNORMAL HIGH (ref 70–99)
Glucose-Capillary: 162 mg/dL — ABNORMAL HIGH (ref 70–99)
Glucose-Capillary: 86 mg/dL (ref 70–99)

## 2011-01-11 LAB — POCT I-STAT 4, (NA,K, GLUC, HGB,HCT)
Glucose, Bld: 105 mg/dL — ABNORMAL HIGH (ref 70–99)
Glucose, Bld: 106 mg/dL — ABNORMAL HIGH (ref 70–99)
Glucose, Bld: 116 mg/dL — ABNORMAL HIGH (ref 70–99)
Glucose, Bld: 119 mg/dL — ABNORMAL HIGH (ref 70–99)
Glucose, Bld: 121 mg/dL — ABNORMAL HIGH (ref 70–99)
Glucose, Bld: 147 mg/dL — ABNORMAL HIGH (ref 70–99)
HCT: 26 % — ABNORMAL LOW (ref 39.0–52.0)
HCT: 28 % — ABNORMAL LOW (ref 39.0–52.0)
HCT: 28 % — ABNORMAL LOW (ref 39.0–52.0)
HCT: 28 % — ABNORMAL LOW (ref 39.0–52.0)
HCT: 37 % — ABNORMAL LOW (ref 39.0–52.0)
HCT: 41 % (ref 39.0–52.0)
Hemoglobin: 12.6 g/dL — ABNORMAL LOW (ref 13.0–17.0)
Hemoglobin: 12.9 g/dL — ABNORMAL LOW (ref 13.0–17.0)
Hemoglobin: 9.2 g/dL — ABNORMAL LOW (ref 13.0–17.0)
Hemoglobin: 9.5 g/dL — ABNORMAL LOW (ref 13.0–17.0)
Hemoglobin: 9.5 g/dL — ABNORMAL LOW (ref 13.0–17.0)
Potassium: 3.6 mEq/L (ref 3.5–5.1)
Potassium: 3.7 mEq/L (ref 3.5–5.1)
Potassium: 4.5 mEq/L (ref 3.5–5.1)
Potassium: 4.7 mEq/L (ref 3.5–5.1)
Potassium: 4.8 mEq/L (ref 3.5–5.1)
Potassium: 5.3 mEq/L — ABNORMAL HIGH (ref 3.5–5.1)
Potassium: 5.4 mEq/L — ABNORMAL HIGH (ref 3.5–5.1)
Sodium: 128 mEq/L — ABNORMAL LOW (ref 135–145)
Sodium: 132 mEq/L — ABNORMAL LOW (ref 135–145)
Sodium: 132 mEq/L — ABNORMAL LOW (ref 135–145)
Sodium: 133 mEq/L — ABNORMAL LOW (ref 135–145)
Sodium: 135 mEq/L (ref 135–145)
Sodium: 137 mEq/L (ref 135–145)
Sodium: 138 mEq/L (ref 135–145)

## 2011-01-11 LAB — POCT I-STAT 3, ART BLOOD GAS (G3+)
Acid-Base Excess: 3 mmol/L — ABNORMAL HIGH (ref 0.0–2.0)
Acid-Base Excess: 6 mmol/L — ABNORMAL HIGH (ref 0.0–2.0)
Acid-base deficit: 1 mmol/L (ref 0.0–2.0)
Acid-base deficit: 1 mmol/L (ref 0.0–2.0)
Bicarbonate: 22.7 mEq/L (ref 20.0–24.0)
Bicarbonate: 27.5 mEq/L — ABNORMAL HIGH (ref 20.0–24.0)
O2 Saturation: 100 %
O2 Saturation: 100 %
O2 Saturation: 100 %
O2 Saturation: 96 %
Patient temperature: 37.7
TCO2: 24 mmol/L (ref 0–100)
TCO2: 25 mmol/L (ref 0–100)
TCO2: 28 mmol/L (ref 0–100)
pCO2 arterial: 35.9 mmHg (ref 35.0–45.0)
pCO2 arterial: 36.3 mmHg (ref 35.0–45.0)
pH, Arterial: 7.411 (ref 7.350–7.450)
pO2, Arterial: 225 mmHg — ABNORMAL HIGH (ref 80.0–100.0)
pO2, Arterial: 78 mmHg — ABNORMAL LOW (ref 80.0–100.0)

## 2011-01-11 LAB — POCT I-STAT 3, VENOUS BLOOD GAS (G3P V)
O2 Saturation: 88 %
pO2, Ven: 58 mmHg — ABNORMAL HIGH (ref 30.0–45.0)

## 2011-01-11 LAB — BASIC METABOLIC PANEL
BUN: 25 mg/dL — ABNORMAL HIGH (ref 6–23)
BUN: 27 mg/dL — ABNORMAL HIGH (ref 6–23)
BUN: 30 mg/dL — ABNORMAL HIGH (ref 6–23)
BUN: 30 mg/dL — ABNORMAL HIGH (ref 6–23)
BUN: 40 mg/dL — ABNORMAL HIGH (ref 6–23)
BUN: 46 mg/dL — ABNORMAL HIGH (ref 6–23)
BUN: 46 mg/dL — ABNORMAL HIGH (ref 6–23)
BUN: 46 mg/dL — ABNORMAL HIGH (ref 6–23)
CO2: 24 mEq/L (ref 19–32)
CO2: 25 mEq/L (ref 19–32)
CO2: 25 mEq/L (ref 19–32)
CO2: 27 mEq/L (ref 19–32)
CO2: 27 mEq/L (ref 19–32)
Calcium: 7.9 mg/dL — ABNORMAL LOW (ref 8.4–10.5)
Calcium: 8 mg/dL — ABNORMAL LOW (ref 8.4–10.5)
Calcium: 8.1 mg/dL — ABNORMAL LOW (ref 8.4–10.5)
Calcium: 8.3 mg/dL — ABNORMAL LOW (ref 8.4–10.5)
Calcium: 8.7 mg/dL (ref 8.4–10.5)
Chloride: 102 mEq/L (ref 96–112)
Chloride: 103 mEq/L (ref 96–112)
Chloride: 103 mEq/L (ref 96–112)
Chloride: 104 mEq/L (ref 96–112)
Chloride: 104 mEq/L (ref 96–112)
Chloride: 105 mEq/L (ref 96–112)
Chloride: 108 mEq/L (ref 96–112)
Chloride: 109 mEq/L (ref 96–112)
Creatinine, Ser: 1.51 mg/dL — ABNORMAL HIGH (ref 0.4–1.5)
Creatinine, Ser: 1.52 mg/dL — ABNORMAL HIGH (ref 0.4–1.5)
Creatinine, Ser: 1.57 mg/dL — ABNORMAL HIGH (ref 0.4–1.5)
Creatinine, Ser: 1.58 mg/dL — ABNORMAL HIGH (ref 0.4–1.5)
Creatinine, Ser: 1.62 mg/dL — ABNORMAL HIGH (ref 0.4–1.5)
Creatinine, Ser: 1.81 mg/dL — ABNORMAL HIGH (ref 0.4–1.5)
Creatinine, Ser: 2.01 mg/dL — ABNORMAL HIGH (ref 0.4–1.5)
Creatinine, Ser: 2.2 mg/dL — ABNORMAL HIGH (ref 0.4–1.5)
Creatinine, Ser: 2.63 mg/dL — ABNORMAL HIGH (ref 0.4–1.5)
GFR calc Af Amer: 40 mL/min — ABNORMAL LOW (ref >60)
GFR calc Af Amer: 45 mL/min — ABNORMAL LOW (ref >60)
GFR calc Af Amer: 52 mL/min — ABNORMAL LOW (ref >60)
GFR calc Af Amer: 53 mL/min — ABNORMAL LOW (ref >60)
GFR calc Af Amer: 55 mL/min — ABNORMAL LOW (ref >60)
GFR calc Af Amer: 55 mL/min — ABNORMAL LOW (ref >60)
GFR calc Af Amer: 56 mL/min — ABNORMAL LOW (ref >60)
GFR calc non Af Amer: 24 mL/min — ABNORMAL LOW (ref >60)
GFR calc non Af Amer: 33 mL/min — ABNORMAL LOW (ref >60)
GFR calc non Af Amer: 37 mL/min — ABNORMAL LOW (ref >60)
GFR calc non Af Amer: 43 mL/min — ABNORMAL LOW (ref >60)
GFR calc non Af Amer: 44 mL/min — ABNORMAL LOW (ref >60)
GFR calc non Af Amer: 45 mL/min — ABNORMAL LOW (ref >60)
GFR calc non Af Amer: 48 mL/min — ABNORMAL LOW (ref >60)
GFR calc non Af Amer: 49 mL/min — ABNORMAL LOW (ref >60)
Glucose, Bld: 114 mg/dL — ABNORMAL HIGH (ref 70–99)
Glucose, Bld: 121 mg/dL — ABNORMAL HIGH (ref 70–99)
Glucose, Bld: 122 mg/dL — ABNORMAL HIGH (ref 70–99)
Glucose, Bld: 130 mg/dL — ABNORMAL HIGH (ref 70–99)
Glucose, Bld: 131 mg/dL — ABNORMAL HIGH (ref 70–99)
Glucose, Bld: 149 mg/dL — ABNORMAL HIGH (ref 70–99)
Potassium: 3.1 mEq/L — ABNORMAL LOW (ref 3.5–5.1)
Potassium: 3.3 mEq/L — ABNORMAL LOW (ref 3.5–5.1)
Potassium: 3.4 mEq/L — ABNORMAL LOW (ref 3.5–5.1)
Potassium: 4.4 mEq/L (ref 3.5–5.1)
Potassium: 4.5 mEq/L (ref 3.5–5.1)
Potassium: 4.7 mEq/L (ref 3.5–5.1)
Sodium: 136 mEq/L (ref 135–145)
Sodium: 137 mEq/L (ref 135–145)
Sodium: 137 mEq/L (ref 135–145)
Sodium: 137 mEq/L (ref 135–145)
Sodium: 138 mEq/L (ref 135–145)
Sodium: 138 mEq/L (ref 135–145)
Sodium: 139 mEq/L (ref 135–145)
Sodium: 142 mEq/L (ref 135–145)
Sodium: 142 mEq/L (ref 135–145)

## 2011-01-11 LAB — POCT I-STAT, CHEM 8
BUN: 20 mg/dL (ref 6–23)
Creatinine, Ser: 1.5 mg/dL (ref 0.4–1.5)
Glucose, Bld: 153 mg/dL — ABNORMAL HIGH (ref 70–99)
HCT: 31 % — ABNORMAL LOW (ref 39.0–52.0)
Hemoglobin: 10.5 g/dL — ABNORMAL LOW (ref 13.0–17.0)
Potassium: 4.6 mEq/L (ref 3.5–5.1)
Potassium: 4.8 mEq/L (ref 3.5–5.1)
Sodium: 140 mEq/L (ref 135–145)

## 2011-01-11 LAB — CBC
HCT: 25 % — ABNORMAL LOW (ref 39.0–52.0)
HCT: 26.7 % — ABNORMAL LOW (ref 39.0–52.0)
HCT: 27.2 % — ABNORMAL LOW (ref 39.0–52.0)
HCT: 30.5 % — ABNORMAL LOW (ref 39.0–52.0)
Hemoglobin: 10.3 g/dL — ABNORMAL LOW (ref 13.0–17.0)
Hemoglobin: 10.7 g/dL — ABNORMAL LOW (ref 13.0–17.0)
Hemoglobin: 12.7 g/dL — ABNORMAL LOW (ref 13.0–17.0)
Hemoglobin: 8.4 g/dL — ABNORMAL LOW (ref 13.0–17.0)
Hemoglobin: 9 g/dL — ABNORMAL LOW (ref 13.0–17.0)
Hemoglobin: 9.1 g/dL — ABNORMAL LOW (ref 13.0–17.0)
MCHC: 33.2 g/dL (ref 30.0–36.0)
MCHC: 33.2 g/dL (ref 30.0–36.0)
MCHC: 33.3 g/dL (ref 30.0–36.0)
MCHC: 33.7 g/dL (ref 30.0–36.0)
MCHC: 33.8 g/dL (ref 30.0–36.0)
MCHC: 34.1 g/dL (ref 30.0–36.0)
MCV: 85.4 fL (ref 78.0–100.0)
MCV: 87.4 fL (ref 78.0–100.0)
MCV: 87.4 fL (ref 78.0–100.0)
MCV: 87.5 fL (ref 78.0–100.0)
MCV: 87.9 fL (ref 78.0–100.0)
MCV: 88.5 fL (ref 78.0–100.0)
MCV: 88.6 fL (ref 78.0–100.0)
MCV: 89.5 fL (ref 78.0–100.0)
Platelets: 104 10*3/uL — ABNORMAL LOW (ref 150–400)
Platelets: 123 10*3/uL — ABNORMAL LOW (ref 150–400)
Platelets: 161 10*3/uL (ref 150–400)
Platelets: 193 10*3/uL (ref 150–400)
RBC: 3.04 MIL/uL — ABNORMAL LOW (ref 4.22–5.81)
RBC: 3.04 MIL/uL — ABNORMAL LOW (ref 4.22–5.81)
RBC: 3.46 MIL/uL — ABNORMAL LOW (ref 4.22–5.81)
RBC: 3.5 MIL/uL — ABNORMAL LOW (ref 4.22–5.81)
RBC: 3.51 MIL/uL — ABNORMAL LOW (ref 4.22–5.81)
RBC: 3.54 MIL/uL — ABNORMAL LOW (ref 4.22–5.81)
RBC: 3.69 MIL/uL — ABNORMAL LOW (ref 4.22–5.81)
RBC: 4.25 MIL/uL (ref 4.22–5.81)
RBC: 4.49 MIL/uL (ref 4.22–5.81)
RDW: 13.4 % (ref 11.5–15.5)
RDW: 14 % (ref 11.5–15.5)
RDW: 14 % (ref 11.5–15.5)
RDW: 14.1 % (ref 11.5–15.5)
RDW: 14.3 % (ref 11.5–15.5)
RDW: 14.3 % (ref 11.5–15.5)
RDW: 14.3 % (ref 11.5–15.5)
WBC: 10.1 10*3/uL (ref 4.0–10.5)
WBC: 12.3 10*3/uL — ABNORMAL HIGH (ref 4.0–10.5)
WBC: 12.9 10*3/uL — ABNORMAL HIGH (ref 4.0–10.5)
WBC: 8.1 10*3/uL (ref 4.0–10.5)
WBC: 9.1 10*3/uL (ref 4.0–10.5)
WBC: 9.2 10*3/uL (ref 4.0–10.5)
WBC: 9.6 10*3/uL (ref 4.0–10.5)

## 2011-01-11 LAB — MAGNESIUM: Magnesium: 2.6 mg/dL — ABNORMAL HIGH (ref 1.5–2.5)

## 2011-01-11 LAB — DIFFERENTIAL
Lymphocytes Relative: 15 % (ref 12–46)
Lymphs Abs: 1.2 10*3/uL (ref 0.7–4.0)
Monocytes Relative: 14 % — ABNORMAL HIGH (ref 3–12)
Neutro Abs: 5.6 10*3/uL (ref 1.7–7.7)
Neutrophils Relative %: 69 % (ref 43–77)

## 2011-01-11 LAB — PLATELET COUNT: Platelets: 204 10*3/uL (ref 150–400)

## 2011-01-11 LAB — NA AND K (SODIUM & POTASSIUM), RAND UR
Potassium Urine: 72 mEq/L
Sodium, Ur: 31 mEq/L

## 2011-01-11 LAB — PROTIME-INR
INR: 1.4 (ref 0.00–1.49)
INR: 2 — ABNORMAL HIGH (ref 0.00–1.49)
INR: 3.4 — ABNORMAL HIGH (ref 0.00–1.49)
Prothrombin Time: 14.6 seconds (ref 11.6–15.2)
Prothrombin Time: 17.1 seconds — ABNORMAL HIGH (ref 11.6–15.2)
Prothrombin Time: 18.2 seconds — ABNORMAL HIGH (ref 11.6–15.2)
Prothrombin Time: 23.9 seconds — ABNORMAL HIGH (ref 11.6–15.2)
Prothrombin Time: 37.3 seconds — ABNORMAL HIGH (ref 11.6–15.2)

## 2011-01-11 LAB — CREATININE, SERUM
Creatinine, Ser: 2.27 mg/dL — ABNORMAL HIGH (ref 0.4–1.5)
GFR calc non Af Amer: 28 mL/min — ABNORMAL LOW (ref >60)

## 2011-01-11 LAB — APTT: aPTT: 54 seconds — ABNORMAL HIGH (ref 24–37)

## 2011-01-11 LAB — AMYLASE: Amylase: 67 U/L (ref 27–131)

## 2013-01-20 ENCOUNTER — Encounter: Payer: Self-pay | Admitting: Cardiology

## 2013-01-23 ENCOUNTER — Encounter: Payer: Self-pay | Admitting: General Surgery

## 2013-01-23 ENCOUNTER — Ambulatory Visit (INDEPENDENT_AMBULATORY_CARE_PROVIDER_SITE_OTHER): Payer: Medicare Other | Admitting: Cardiology

## 2013-01-23 ENCOUNTER — Encounter: Payer: Self-pay | Admitting: Cardiology

## 2013-01-23 VITALS — BP 120/60 | HR 80 | Ht 72.0 in | Wt 214.0 lb

## 2013-01-23 DIAGNOSIS — Z7901 Long term (current) use of anticoagulants: Secondary | ICD-10-CM | POA: Insufficient documentation

## 2013-01-23 DIAGNOSIS — I5189 Other ill-defined heart diseases: Secondary | ICD-10-CM

## 2013-01-23 DIAGNOSIS — I4819 Other persistent atrial fibrillation: Secondary | ICD-10-CM

## 2013-01-23 DIAGNOSIS — I359 Nonrheumatic aortic valve disorder, unspecified: Secondary | ICD-10-CM

## 2013-01-23 DIAGNOSIS — I2510 Atherosclerotic heart disease of native coronary artery without angina pectoris: Secondary | ICD-10-CM | POA: Insufficient documentation

## 2013-01-23 DIAGNOSIS — E785 Hyperlipidemia, unspecified: Secondary | ICD-10-CM | POA: Insufficient documentation

## 2013-01-23 DIAGNOSIS — I4891 Unspecified atrial fibrillation: Secondary | ICD-10-CM

## 2013-01-23 DIAGNOSIS — I519 Heart disease, unspecified: Secondary | ICD-10-CM

## 2013-01-23 DIAGNOSIS — I10 Essential (primary) hypertension: Secondary | ICD-10-CM

## 2013-01-23 DIAGNOSIS — I350 Nonrheumatic aortic (valve) stenosis: Secondary | ICD-10-CM | POA: Insufficient documentation

## 2013-01-23 HISTORY — DX: Other persistent atrial fibrillation: I48.19

## 2013-01-23 HISTORY — DX: Essential (primary) hypertension: I10

## 2013-01-23 HISTORY — DX: Hyperlipidemia, unspecified: E78.5

## 2013-01-23 HISTORY — DX: Long term (current) use of anticoagulants: Z79.01

## 2013-01-23 LAB — BASIC METABOLIC PANEL
Calcium: 9 mg/dL (ref 8.4–10.5)
GFR: 39.47 mL/min — ABNORMAL LOW (ref >60.00)
Potassium: 4.4 mEq/L (ref 3.5–5.1)
Sodium: 135 mEq/L (ref 135–145)

## 2013-01-23 LAB — CBC
Hemoglobin: 14 g/dL (ref 13.0–17.0)
MCHC: 33.2 g/dL (ref 30.0–36.0)
Platelets: 240 10*3/uL (ref 150.0–400.0)
RDW: 14.5 % (ref 11.5–14.6)
WBC: 10.7 10*3/uL — ABNORMAL HIGH (ref 4.5–10.5)

## 2013-01-23 LAB — PROTIME-INR: INR: 1.3 ratio — ABNORMAL HIGH (ref 0.8–1.0)

## 2013-01-23 NOTE — Progress Notes (Signed)
[place], [place], [place]  [address] Phone: [phone] Fax:  [phone]  Date:  01/23/2013   ID:  [name], DOB [birth year], MRN [id number]  PCP:  No primary provider on file.  Cardiologist:  [name], MD     History of Present Illness: [name] is a 79 y.o. male with a history of CAD/HTN/diastolic dysfunction, dyslipidemia and newly diagnosed atrial fibrillation who presents for followup.  On his last OV he was noted to have new onset atrial fibrillation and was started on Eliquis.   He is doing well.  He denies any chest pain, SOB, DOE, LE edema, dizziness, palpitations or syncope.   Wt Readings from Last 3 Encounters:  01/23/13 214 lb (97.07 kg)     Past Medical History  Diagnosis Date  . Heart murmur   . Hypercholesteremia   . HTN (hypertension)   . CHF (congestive heart failure)     2D echo 12/2010 - nl LVF, mild LAE, MAC with moderately thickened MV leaflets and trivial MR, normal functioning prosthetic AVR with mild increased gradient, grade II diastolic dysfunction   . Hypotension   . Orthostatic hypotension   . CKD (chronic kidney disease)   . Aortic valve disorder 2009    severe AS s/p AVR with pericardial tissue valve  . Diabetes   . Aortic stenosis   . Barrett esophagus   . CAD (coronary artery disease)     s/p 2 vessel CABG with LIMA to LAD, SVG to diagonal 1, SVG to RCA 12/09  . H/O: rheumatic fever   . Atrial fibrillation     post op afib with no reoccurence  . Diastolic dysfunction     Current Outpatient Prescriptions  Medication Sig Dispense Refill  . amLODipine (NORVASC) 10 MG tablet Take 10 mg by mouth daily.      Marland Kitchen apixaban (ELIQUIS) 5 MG TABS tablet Take by mouth 2 (two) times daily.      Marland Kitchen atorvastatin (LIPITOR) 20 MG tablet Take 20 mg by mouth daily.      . furosemide (LASIX) 40 MG tablet Take 40 mg by mouth.      Marland Kitchen lisinopril (PRINIVIL,ZESTRIL) 40 MG tablet Take 40 mg by mouth daily.      . Magnesium 250 MG  TABS Take by mouth daily.      . Multiple Vitamin (MULTIVITAMIN) capsule Take 1 capsule by mouth daily.      Marland Kitchen omega-3 acid ethyl esters (LOVAZA) 1 G capsule Take 2 g by mouth 2 (two) times daily.      Marland Kitchen omeprazole (PRILOSEC) 20 MG capsule Take 20 mg by mouth daily.      . Potassium Gluconate 595 MG CAPS Take 1 capsule by mouth daily.       No current facility-administered medications for this visit.    Allergies:   Not on File  Social History:  The patient  reports that he has never smoked. He does not have any smokeless tobacco history on file. He reports that he does not drink alcohol or use illicit drugs.   Family History:  The patient's family history includes Alzheimer's disease in his mother and another family member.   ROS:  Please see the history of present illness.      All other systems reviewed and negative.   PHYSICAL EXAM: VS:  BP 120/60  Pulse 80  Ht 6' (1.829 m)  Wt 214 lb (97.07 kg)  BMI 29.02 kg/m2 Well nourished,  well developed, in no acute distress HEENT: normal Neck: no JVD Cardiac:  normal S1, S2:  Irregularly irregular; no murmur Lungs:  clear to auscultation bilaterally, no wheezing, rhonchi or rales Abd: soft, nontender, no hepatomegaly Ext: no edema Skin: warm and dry Neuro:  CNs 2-12 intact, no focal abnormalities noted  EKG:       ASSESSMENT AND PLAN:  1. CAD stable with no angina 2. HTN - controlled  - continue amlodipine/Lisinopril 3. Dyslipidemia  - continue Atorvastatin 4. Diastolic dysfunction  - contine Lasix/potassium 5. Atrial fibrillation - remains in afib rate controlled  -continue Eliquis  - I have recommended proceeding with DCCV.  He has been on Eliquis for 4 weeks and has not missed any doses.  I reviewed the risks of DCCV including risk of MI, death, CVA, arrhythyhmia, or bradycardia needing temporary transcutanous pacing.  Patient understands an wishes to proceed. 6. Systemic anticoagulation  Followup with me in 6  months  Signed, [name], MD 01/23/2013 1:17 PM

## 2013-01-23 NOTE — Patient Instructions (Signed)
Your physician recommends that you continue on your current medications as directed. Please refer to the Current Medication list given to you today.   Your physician has requested that you have a TEE/Cardioversion. During a TEE, sound waves are used to create images of your heart. It provides your doctor with information about the size and shape of your heart and how well your heart's chambers and valves are working. In this test, a transducer is attached to the end of a flexible tube that is guided down you throat and into your esophagus (the tube leading from your mouth to your stomach) to get a more detailed image of your heart. Once the TEE has determined that a blood clot is not present, the cardioversion begins. Electrical Cardioversion uses a jolt of electricity to your heart either through paddles or wired patches attached to your chest. This is a controlled, usually prescheduled, procedure. This procedure is done at the hospital and you are not awake during the procedure. You usually go home the day of the procedure. Please see the instruction sheet given to you today for more information. This is Scheduled for 2:00 Pm next Tuesday 01/27/13.  Please go to the lab today to have a CBC, BMET, and PT/INR  Your physician recommends that you schedule a follow-up appointment in: 3 weeks with Dr. Radford Pax

## 2013-01-26 ENCOUNTER — Telehealth: Payer: Self-pay | Admitting: General Surgery

## 2013-01-26 DIAGNOSIS — Z79899 Other long term (current) drug therapy: Secondary | ICD-10-CM

## 2013-01-26 MED ORDER — FUROSEMIDE 20 MG PO TABS: 20.0000 mg | ORAL_TABLET | Freq: Every day | ORAL | Status: DC

## 2013-01-26 NOTE — Telephone Encounter (Signed)
Message copied by Lily Kocher on Mon Jan 26, 2013 11:20 AM ------      Message from: [name]      Created: Sat Jan 24, 2013  6:45 PM       Creatinine increased.  Please have patient decrease Lasix to 70m daily and recheck BMET in 1 week ------

## 2013-01-27 ENCOUNTER — Encounter (HOSPITAL_COMMUNITY): Payer: Self-pay

## 2013-01-27 ENCOUNTER — Ambulatory Visit (HOSPITAL_COMMUNITY)
Admission: RE | Admit: 2013-01-27 | Discharge: 2013-01-27 | Disposition: A | Discharge status: Home / Self Care | Payer: Medicare Other | Source: Ambulatory Visit | Attending: Cardiology | Admitting: Cardiology

## 2013-01-27 ENCOUNTER — Encounter (HOSPITAL_COMMUNITY): Payer: Medicare Other | Admitting: Certified Registered"

## 2013-01-27 ENCOUNTER — Encounter (HOSPITAL_COMMUNITY)
Admission: RE | Disposition: A | Discharge status: Home / Self Care | Payer: Self-pay | Source: Ambulatory Visit | Attending: Cardiology

## 2013-01-27 ENCOUNTER — Ambulatory Visit (HOSPITAL_COMMUNITY): Payer: Medicare Other | Admitting: Certified Registered"

## 2013-01-27 DIAGNOSIS — E119 Type 2 diabetes mellitus without complications: Secondary | ICD-10-CM | POA: Insufficient documentation

## 2013-01-27 DIAGNOSIS — Z7901 Long term (current) use of anticoagulants: Secondary | ICD-10-CM | POA: Insufficient documentation

## 2013-01-27 DIAGNOSIS — E785 Hyperlipidemia, unspecified: Secondary | ICD-10-CM | POA: Insufficient documentation

## 2013-01-27 DIAGNOSIS — I5030 Unspecified diastolic (congestive) heart failure: Secondary | ICD-10-CM | POA: Insufficient documentation

## 2013-01-27 DIAGNOSIS — I129 Hypertensive chronic kidney disease with stage 1 through stage 4 chronic kidney disease, or unspecified chronic kidney disease: Secondary | ICD-10-CM | POA: Insufficient documentation

## 2013-01-27 DIAGNOSIS — N189 Chronic kidney disease, unspecified: Secondary | ICD-10-CM | POA: Insufficient documentation

## 2013-01-27 DIAGNOSIS — Z952 Presence of prosthetic heart valve: Secondary | ICD-10-CM | POA: Insufficient documentation

## 2013-01-27 DIAGNOSIS — I509 Heart failure, unspecified: Secondary | ICD-10-CM | POA: Insufficient documentation

## 2013-01-27 DIAGNOSIS — I2510 Atherosclerotic heart disease of native coronary artery without angina pectoris: Secondary | ICD-10-CM | POA: Insufficient documentation

## 2013-01-27 DIAGNOSIS — I4891 Unspecified atrial fibrillation: Secondary | ICD-10-CM | POA: Insufficient documentation

## 2013-01-27 HISTORY — PX: CARDIOVERSION: SHX1299

## 2013-01-27 SURGERY — CARDIOVERSION
Anesthesia: General

## 2013-01-27 MED ORDER — SODIUM CHLORIDE 0.45 % IV SOLN: INTRAVENOUS | Status: DC

## 2013-01-27 MED ORDER — PROPOFOL 10 MG/ML IV BOLUS
INTRAVENOUS | Status: DC | PRN
  Administered 2013-01-27: 40 mg via INTRAVENOUS

## 2013-01-27 MED ORDER — SODIUM CHLORIDE 0.9 % IV SOLN
INTRAVENOUS | Status: DC | PRN
  Administered 2013-01-27: 14:00:00 via INTRAVENOUS

## 2013-01-27 MED ORDER — LIDOCAINE HCL (CARDIAC) 20 MG/ML IV SOLN
INTRAVENOUS | Status: DC | PRN
  Administered 2013-01-27: 40 mg via INTRAVENOUS

## 2013-01-27 MED ORDER — SODIUM CHLORIDE 0.9 % IV SOLN
INTRAVENOUS | Status: DC
  Administered 2013-01-27: 500 mL via INTRAVENOUS

## 2013-01-27 NOTE — Anesthesia Postprocedure Evaluation (Signed)
  Anesthesia Post-op Note  Patient: [name]  Procedure(s) Performed: Procedure(s): CARDIOVERSION (N/A)  Patient Location: PACU  Anesthesia Type:General  Level of Consciousness: awake, alert , oriented and patient cooperative  Airway and Oxygen Therapy: Patient Spontanous Breathing  Post-op Pain: none  Post-op Assessment: Post-op Vital signs reviewed, Patient's Cardiovascular Status Stable, Respiratory Function Stable, Patent Airway, No signs of Nausea or vomiting and Pain level controlled  Post-op Vital Signs: stable  Complications: No apparent anesthesia complications

## 2013-01-27 NOTE — Transfer of Care (Signed)
Immediate Anesthesia Transfer of Care Note  Patient: [name]  Procedure(s) Performed: Procedure(s): CARDIOVERSION (N/A)  Patient Location: PACU and Short Stay  Anesthesia Type:MAC  Level of Consciousness: awake and alert   Airway & Oxygen Therapy: Patient Spontanous Breathing and Patient connected to nasal cannula oxygen  Post-op Assessment: Report given to PACU RN and Post -op Vital signs reviewed and stable  Post vital signs: Reviewed and stable  Complications: No apparent anesthesia complications

## 2013-01-27 NOTE — H&P (View-Only) (Signed)
[address], [address] [place],   [address] Phone: [phone] Fax:  [phone]  Date:  01/23/2013   ID:  [name], DOB 03/10/1933, MRN [id number]  PCP:  No primary provider on file.  Cardiologist:  Nalea Salce, MD     History of Present Illness: [name] is a 80 y.o. male with a history of CAD/HTN/diastolic dysfunction, dyslipidemia and newly diagnosed atrial fibrillation who presents for followup.  On his last OV he was noted to have new onset atrial fibrillation and was started on Eliquis.   He is doing well.  He denies any chest pain, SOB, DOE, LE edema, dizziness, palpitations or syncope.   Wt Readings from Last 3 Encounters:  01/23/13 214 lb (97.07 kg)     Past Medical History  Diagnosis Date  . Heart murmur   . Hypercholesteremia   . HTN (hypertension)   . CHF (congestive heart failure)     2D echo 12/2010 - nl LVF, mild LAE, MAC with moderately thickened MV leaflets and trivial MR, normal functioning prosthetic AVR with mild increased gradient, grade II diastolic dysfunction   . Hypotension   . Orthostatic hypotension   . CKD (chronic kidney disease)   . Aortic valve disorder 2009    severe AS s/p AVR with pericardial tissue valve  . Diabetes   . Aortic stenosis   . Barrett esophagus   . CAD (coronary artery disease)     s/p 2 vessel CABG with LIMA to LAD, SVG to diagonal 1, SVG to RCA 12/09  . H/O: rheumatic fever   . Atrial fibrillation     post op afib with no reoccurence  . Diastolic dysfunction     Current Outpatient Prescriptions  Medication Sig Dispense Refill  . amLODipine (NORVASC) 10 MG tablet Take 10 mg by mouth daily.      . apixaban (ELIQUIS) 5 MG TABS tablet Take by mouth 2 (two) times daily.      . atorvastatin (LIPITOR) 20 MG tablet Take 20 mg by mouth daily.      . furosemide (LASIX) 40 MG tablet Take 40 mg by mouth.      . lisinopril (PRINIVIL,ZESTRIL) 40 MG tablet Take 40 mg by mouth daily.      . Magnesium 250 MG  TABS Take by mouth daily.      . Multiple Vitamin (MULTIVITAMIN) capsule Take 1 capsule by mouth daily.      . omega-3 acid ethyl esters (LOVAZA) 1 G capsule Take 2 g by mouth 2 (two) times daily.      . omeprazole (PRILOSEC) 20 MG capsule Take 20 mg by mouth daily.      . Potassium Gluconate 595 MG CAPS Take 1 capsule by mouth daily.       No current facility-administered medications for this visit.    Allergies:   Not on File  Social History:  The patient  reports that he has never smoked. He does not have any smokeless tobacco history on file. He reports that he does not drink alcohol or use illicit drugs.   Family History:  The patient's family history includes Alzheimer's disease in his mother and another family member.   ROS:  Please see the history of present illness.      All other systems reviewed and negative.   PHYSICAL EXAM: VS:  BP 120/60  Pulse 80  Ht 6' (1.829 m)  Wt 214 lb (97.07 kg)  BMI 29.02 kg/m2 Well nourished,   well developed, in no acute distress HEENT: normal Neck: no JVD Cardiac:  normal S1, S2:  Irregularly irregular; no murmur Lungs:  clear to auscultation bilaterally, no wheezing, rhonchi or rales Abd: soft, nontender, no hepatomegaly Ext: no edema Skin: warm and dry Neuro:  CNs 2-12 intact, no focal abnormalities noted  EKG:       ASSESSMENT AND PLAN:  1. CAD stable with no angina 2. HTN - controlled  - continue amlodipine/Lisinopril 3. Dyslipidemia  - continue Atorvastatin 4. Diastolic dysfunction  - contine Lasix/potassium 5. Atrial fibrillation - remains in afib rate controlled  -continue Eliquis  - I have recommended proceeding with DCCV.  He has been on Eliquis for 4 weeks and has not missed any doses.  I reviewed the risks of DCCV including risk of MI, death, CVA, arrhythyhmia, or bradycardia needing temporary transcutanous pacing.  Patient understands an wishes to proceed. 6. Systemic anticoagulation  Followup with me in 6  months  Signed, [name], MD 01/23/2013 1:17 PM

## 2013-01-27 NOTE — Anesthesia Preprocedure Evaluation (Addendum)
Anesthesia Evaluation  Patient identified by MRN, date of birth, ID band Patient awake    Reviewed: Allergy & Precautions, H&P , NPO status   History of Anesthesia Complications Negative for: history of anesthetic complications  Airway Mallampati: II TM Distance: <3 FB Neck ROM: Full    Dental  (+) Missing, Poor Dentition, Dental Advisory Given and Chipped Upper right side- missing/chipped:   Pulmonary neg pulmonary ROS,          Cardiovascular hypertension, Pt. on medications + CAD and +CHF + dysrhythmias Atrial Fibrillation     Neuro/Psych negative neurological ROS     GI/Hepatic negative GI ROS, Neg liver ROS,   Endo/Other  diabetes  Renal/GU Renal disease  negative genitourinary   Musculoskeletal  (+) Arthritis -,   Abdominal   Peds  Hematology negative hematology ROS (+)   Anesthesia Other Findings   Reproductive/Obstetrics                           Anesthesia Physical Anesthesia Plan  ASA: III  Anesthesia Plan: General   Post-op Pain Management:    Induction: Intravenous  Airway Management Planned: Mask  Additional Equipment:   Intra-op Plan:   Post-operative Plan:   Informed Consent:   Dental advisory given  Plan Discussed with: CRNA, Anesthesiologist and Surgeon  Anesthesia Plan Comments:        Anesthesia Quick Evaluation

## 2013-01-27 NOTE — CV Procedure (Signed)
Electrical Cardioversion Procedure Note [name] [id number] 27-Mar-1933  Procedure: Electrical Cardioversion Indications:  Atrial Fibrillation  Time Out: Verified patient identification, verified procedure,medications/allergies/relevent history reviewed, required imaging and test results available.  Performed  Procedure Details  The patient was NPO after midnight. Anesthesia was administered at the beside  by Dr.Smith with 45m of propofol and 49mIV Lidocaine.  Cardioversion was done with synchronized biphasic defibrillation with AP pads with 150watts.  The patient converted to normal sinus rhythm with PVC's. The patient tolerated the procedure well   IMPRESSION:  Successful cardioversion of atrial fibrillation    [name] R 01/27/2013, 2:07 PM

## 2013-01-27 NOTE — Interval H&P Note (Signed)
History and Physical Interval Note:  01/27/2013 1:25 PM  [name]  has presented today for surgery, with the diagnosis of afib  The various methods of treatment have been discussed with the patient and family. After consideration of risks, benefits and other options for treatment, the patient has consented to  Procedure(s): CARDIOVERSION (N/A) as a surgical intervention .  The patient's history has been reviewed, patient examined, no change in status, stable for surgery.  I have reviewed the patient's chart and labs.  Questions were answered to the patient's satisfaction.     [name]

## 2013-01-27 NOTE — Preoperative (Signed)
Beta Blockers   Reason not to administer Beta Blockers:Not Applicable 

## 2013-01-28 ENCOUNTER — Encounter (HOSPITAL_COMMUNITY): Payer: Self-pay | Admitting: Cardiology

## 2013-01-28 ENCOUNTER — Telehealth: Payer: Self-pay | Admitting: Cardiology

## 2013-01-28 NOTE — Telephone Encounter (Signed)
Pt wanted to know when his next lab work was. I told him it was on 02/02/13 for a BMET

## 2013-01-28 NOTE — Telephone Encounter (Signed)
New Problem:  Pt states he is returning Danielle's call. PT states in regards to his new medication. Please advise

## 2013-02-02 ENCOUNTER — Other Ambulatory Visit (INDEPENDENT_AMBULATORY_CARE_PROVIDER_SITE_OTHER): Payer: Medicare Other

## 2013-02-02 DIAGNOSIS — Z79899 Other long term (current) drug therapy: Secondary | ICD-10-CM

## 2013-02-02 LAB — BASIC METABOLIC PANEL
BUN: 24 mg/dL — ABNORMAL HIGH (ref 6–23)
Chloride: 101 mEq/L (ref 96–112)
Creatinine, Ser: 1.6 mg/dL — ABNORMAL HIGH (ref 0.4–1.5)
GFR: 44.31 mL/min — ABNORMAL LOW (ref >60.00)
Glucose, Bld: 108 mg/dL — ABNORMAL HIGH (ref 70–99)

## 2013-02-11 ENCOUNTER — Encounter: Payer: Self-pay | Admitting: Cardiology

## 2013-02-16 ENCOUNTER — Ambulatory Visit (INDEPENDENT_AMBULATORY_CARE_PROVIDER_SITE_OTHER): Payer: Medicare Other | Admitting: Cardiology

## 2013-02-16 ENCOUNTER — Encounter: Payer: Self-pay | Admitting: Cardiology

## 2013-02-16 VITALS — BP 160/80 | HR 68 | Ht 72.0 in | Wt 214.0 lb

## 2013-02-16 DIAGNOSIS — E785 Hyperlipidemia, unspecified: Secondary | ICD-10-CM

## 2013-02-16 DIAGNOSIS — I10 Essential (primary) hypertension: Secondary | ICD-10-CM

## 2013-02-16 DIAGNOSIS — I359 Nonrheumatic aortic valve disorder, unspecified: Secondary | ICD-10-CM

## 2013-02-16 DIAGNOSIS — I350 Nonrheumatic aortic (valve) stenosis: Secondary | ICD-10-CM

## 2013-02-16 DIAGNOSIS — I5189 Other ill-defined heart diseases: Secondary | ICD-10-CM

## 2013-02-16 DIAGNOSIS — I519 Heart disease, unspecified: Secondary | ICD-10-CM

## 2013-02-16 DIAGNOSIS — I2510 Atherosclerotic heart disease of native coronary artery without angina pectoris: Secondary | ICD-10-CM

## 2013-02-16 DIAGNOSIS — Z7901 Long term (current) use of anticoagulants: Secondary | ICD-10-CM

## 2013-02-16 DIAGNOSIS — I4891 Unspecified atrial fibrillation: Secondary | ICD-10-CM

## 2013-02-16 MED ORDER — ATENOLOL 25 MG PO TABS: ORAL_TABLET | ORAL | Status: DC

## 2013-02-16 MED ORDER — APIXABAN 5 MG PO TABS: 5.0000 mg | ORAL_TABLET | Freq: Two times a day (BID) | ORAL | Status: DC

## 2013-02-16 NOTE — Patient Instructions (Signed)
Your physician recommends that you continue on your current medications as directed. Please refer to the Current Medication list given to you today.   We sent a rx into [facility] for your Eliquis.  Your physician wants you to follow-up in: 6 months with Dr Mallie Snooks will receive a reminder letter in the mail two months in advance. If you don't receive a letter, please call our office to schedule the follow-up appointment.

## 2013-02-16 NOTE — Progress Notes (Signed)
[address] [place], [place]  [address] Phone: [phone] Fax:  [phone]  Date:  02/16/2013   ID:  [name], DOB 08-24-1932, MRN [id number]  PCP:  Pcp Not In System  Cardiologist:  [name] Him, MD     History of Present Illness: [name] is a 79 y.o. male with a history of HTN, AS s/p AVR, diastolic dysfunction, orthostatic hypotension, ASCAD s/p CABG, post op afib who had a reoccurrence of afib recently and he underwent DCCV.  He now presents back for followup. He denies any  dizziness, syncope.  He denies any chest pain, SOB, DOE, LE edema.  He says that this past Saturday he had some irregularity to his heart beat.   Wt Readings from Last 3 Encounters:  01/23/13 214 lb (97.07 kg)     Past Medical History  Diagnosis Date  . Heart murmur   . Hypercholesteremia   . HTN (hypertension)   . CHF (congestive heart failure)     2D echo 12/2010 - nl LVF, mild LAE, MAC with moderately thickened MV leaflets and trivial MR, normal functioning prosthetic AVR with mild increased gradient, grade II diastolic dysfunction   . Hypotension   . Orthostatic hypotension   . CKD (chronic kidney disease)   . Aortic valve disorder 2009    severe AS s/p AVR with pericardial tissue valve  . Diabetes   . Barrett esophagus   . CAD (coronary artery disease)     s/p 2 vessel CABG with LIMA to LAD, SVG to diagonal 1, SVG to RCA 12/09  . Cardiomyopathy     EF now normal by echo 2012 at 51%  . H/O: rheumatic fever   . Atrial fibrillation     post op afib with reoccurence s/p DCCV 01/2013  . Diastolic dysfunction     Current Outpatient Prescriptions  Medication Sig Dispense Refill  . amLODipine (NORVASC) 10 MG tablet Take 10 mg by mouth daily.      Marland Kitchen apixaban (ELIQUIS) 5 MG TABS tablet Take by mouth 2 (two) times daily.      Marland Kitchen atenolol (TENORMIN) 25 MG tablet Take 25 mg by mouth daily.      Marland Kitchen atorvastatin (LIPITOR) 20 MG tablet Take 20 mg by mouth daily.      .  furosemide (LASIX) 20 MG tablet Take 1 tablet (20 mg total) by mouth daily.  30 tablet  11  . lisinopril (PRINIVIL,ZESTRIL) 40 MG tablet Take 40 mg by mouth daily.      . Magnesium 250 MG TABS Take by mouth daily.      . Multiple Vitamin (MULTIVITAMIN) capsule Take 1 capsule by mouth daily.      Marland Kitchen omega-3 acid ethyl esters (LOVAZA) 1 G capsule Take 2 g by mouth 2 (two) times daily.      Marland Kitchen omeprazole (PRILOSEC) 20 MG capsule Take 20 mg by mouth daily.      . Potassium Gluconate 595 MG CAPS Take 1 capsule by mouth daily.       No current facility-administered medications for this visit.    Allergies:   No Known Allergies  Social History:  The patient  reports that he has never smoked. He does not have any smokeless tobacco history on file. He reports that he does not drink alcohol or use illicit drugs.   Family History:  The patient's family history includes Alzheimer's disease in his mother and another family member.   ROS:  Please  see the history of present illness.      All other systems reviewed and negative.   PHYSICAL EXAM: VS:  There were no vitals taken for this visit. Well nourished, well developed, in no acute distress HEENT: normal Neck: no JVD Cardiac:  normal S1, S2; RRR; no murmur Lungs:  clear to auscultation bilaterally, no wheezing, rhonchi or rales Abd: soft, nontender, no hepatomegaly Ext: no edema Skin: warm and dry Neuro:  CNs 2-12 intact, no focal abnormalities noted  EKG:     NSR with PVC's and PACS, nonspecific ST abnormality  ASSESSMENT AND PLAN:  1. Paroxysmal atrial fibrillation w/p recent DCCV  - continue apixaban/Atenolol 67m 1 tablet every am and 2 tablets every PM 2. HTN - controlled  - continue Atenolol/Lisinopril/amlodipine 3. Severe AS s/p prosthetic AVR 4. Chronic systemic anticoagulation 5. Diastolic dysfunction  - continue Lasix at 226m1/2 tablet daily - repeat BMET stable 6. ASCAD s/p CABG  Followup with me in 6  months  Signed, TrFransico HimMD 02/16/2013 2:32 PM

## 2013-02-19 ENCOUNTER — Other Ambulatory Visit: Payer: Self-pay

## 2013-02-19 MED ORDER — APIXABAN 5 MG PO TABS: 5.0000 mg | ORAL_TABLET | Freq: Two times a day (BID) | ORAL | Status: DC

## 2013-02-26 ENCOUNTER — Other Ambulatory Visit: Payer: Self-pay | Admitting: General Surgery

## 2013-02-26 ENCOUNTER — Telehealth: Payer: Self-pay | Admitting: Cardiology

## 2013-02-26 NOTE — Telephone Encounter (Signed)
Spoke with pharmacy. They stated the rx was correct they were just waiting for a form of payment from the pt. I asked them to call the pt to get that from him. I called and LVM for pt to make them aware they would be calling him.

## 2013-02-26 NOTE — Telephone Encounter (Signed)
New problem    Pt's ELIQUIS  Prescription is not correct per [employer]. Phar. phone  [phone] please give him a call back when done.

## 2013-05-28 ENCOUNTER — Other Ambulatory Visit: Payer: Medicare HMO

## 2013-06-03 ENCOUNTER — Other Ambulatory Visit: Payer: Self-pay

## 2013-06-03 ENCOUNTER — Ambulatory Visit (INDEPENDENT_AMBULATORY_CARE_PROVIDER_SITE_OTHER): Payer: Medicare HMO | Admitting: *Deleted

## 2013-06-03 DIAGNOSIS — I519 Heart disease, unspecified: Secondary | ICD-10-CM

## 2013-06-03 DIAGNOSIS — I10 Essential (primary) hypertension: Secondary | ICD-10-CM

## 2013-06-03 DIAGNOSIS — I2510 Atherosclerotic heart disease of native coronary artery without angina pectoris: Secondary | ICD-10-CM

## 2013-06-03 DIAGNOSIS — I5189 Other ill-defined heart diseases: Secondary | ICD-10-CM

## 2013-06-03 LAB — ALT: ALT: 36 U/L (ref 0–53)

## 2013-06-05 LAB — NMR LIPOPROFILE WITH LIPIDS
CHOLESTEROL, TOTAL: 129 mg/dL (ref <200)
HDL Particle Number: 32.7 umol/L (ref >=30.5)
HDL Size: 8.6 nm — ABNORMAL LOW (ref >=9.2)
HDL-C: 38 mg/dL — AB (ref >=40)
LDL CALC: 32 mg/dL (ref <100)
LDL PARTICLE NUMBER: 742 nmol/L (ref <1000)
LDL Size: 20.2 nm — ABNORMAL LOW (ref >20.5)
LP-IR Score: 75 — ABNORMAL HIGH (ref <=45)
Large HDL-P: <1.3 umol/L — ABNORMAL LOW (ref >=4.8)
Large VLDL-P: 7 nmol/L — ABNORMAL HIGH (ref <=2.7)
SMALL LDL PARTICLE NUMBER: 502 nmol/L (ref <=527)
Triglycerides: 295 mg/dL — ABNORMAL HIGH (ref <150)
VLDL SIZE: 47 nm — AB (ref <=46.6)

## 2013-06-10 ENCOUNTER — Encounter: Payer: Self-pay | Admitting: General Surgery

## 2013-06-10 ENCOUNTER — Other Ambulatory Visit: Payer: Self-pay | Admitting: General Surgery

## 2013-06-10 DIAGNOSIS — E785 Hyperlipidemia, unspecified: Secondary | ICD-10-CM

## 2013-08-24 ENCOUNTER — Other Ambulatory Visit: Payer: Self-pay | Admitting: Urology

## 2013-08-24 DIAGNOSIS — D490 Neoplasm of unspecified behavior of digestive system: Secondary | ICD-10-CM

## 2013-09-07 ENCOUNTER — Ambulatory Visit (HOSPITAL_COMMUNITY)
Admission: RE | Admit: 2013-09-07 | Discharge: 2013-09-07 | Disposition: A | Discharge status: Home / Self Care | Payer: Medicare HMO | Source: Ambulatory Visit | Attending: Urology | Admitting: Urology

## 2013-09-07 ENCOUNTER — Other Ambulatory Visit (HOSPITAL_COMMUNITY): Payer: Self-pay | Admitting: Urology

## 2013-09-07 DIAGNOSIS — R310 Gross hematuria: Secondary | ICD-10-CM | POA: Insufficient documentation

## 2013-09-07 DIAGNOSIS — D490 Neoplasm of unspecified behavior of digestive system: Secondary | ICD-10-CM | POA: Insufficient documentation

## 2013-09-07 DIAGNOSIS — T1590XA Foreign body on external eye, part unspecified, unspecified eye, initial encounter: Secondary | ICD-10-CM

## 2013-09-07 DIAGNOSIS — Z1389 Encounter for screening for other disorder: Secondary | ICD-10-CM | POA: Insufficient documentation

## 2013-09-07 MED ORDER — GADOBENATE DIMEGLUMINE 529 MG/ML IV SOLN
20.0000 mL | Freq: Once | INTRAVENOUS | Status: AC | PRN
  Administered 2013-09-07: 19 mL via INTRAVENOUS

## 2013-12-11 ENCOUNTER — Other Ambulatory Visit (INDEPENDENT_AMBULATORY_CARE_PROVIDER_SITE_OTHER): Payer: Medicare HMO

## 2013-12-11 DIAGNOSIS — E785 Hyperlipidemia, unspecified: Secondary | ICD-10-CM

## 2013-12-11 LAB — HEPATIC FUNCTION PANEL
ALT: 26 U/L (ref 0–53)
AST: 24 U/L (ref 0–37)
Albumin: 4 g/dL (ref 3.5–5.2)
Alkaline Phosphatase: 43 U/L (ref 39–117)
Bilirubin, Direct: 0.1 mg/dL (ref 0.0–0.3)
TOTAL PROTEIN: 7.1 g/dL (ref 6.0–8.3)
Total Bilirubin: 1 mg/dL (ref 0.2–1.2)

## 2013-12-14 LAB — NMR LIPOPROFILE WITH LIPIDS
Cholesterol, Total: 107 mg/dL (ref 100–199)
HDL Particle Number: 27.8 umol/L — ABNORMAL LOW (ref >=30.5)
HDL Size: 8.6 nm — ABNORMAL LOW (ref >=9.2)
HDL-C: 36 mg/dL — AB (ref >39)
LARGE HDL: 1.4 umol/L — AB (ref >=4.8)
LDL (calc): 31 mg/dL (ref 0–99)
LDL PARTICLE NUMBER: 636 nmol/L (ref <1000)
LDL SIZE: 20.1 nm (ref >=20.8)
LP-IR Score: 75 — ABNORMAL HIGH (ref <=45)
Large VLDL-P: 5.3 nmol/L — ABNORMAL HIGH (ref <=2.7)
Small LDL Particle Number: 384 nmol/L (ref <=527)
Triglycerides: 200 mg/dL — ABNORMAL HIGH (ref 0–149)
VLDL Size: 49.8 nm — ABNORMAL HIGH (ref <=46.6)

## 2013-12-16 ENCOUNTER — Other Ambulatory Visit: Payer: Self-pay | Admitting: General Surgery

## 2013-12-16 ENCOUNTER — Encounter: Payer: Self-pay | Admitting: General Surgery

## 2013-12-16 DIAGNOSIS — E785 Hyperlipidemia, unspecified: Secondary | ICD-10-CM

## 2014-06-16 ENCOUNTER — Other Ambulatory Visit (INDEPENDENT_AMBULATORY_CARE_PROVIDER_SITE_OTHER): Payer: Commercial Managed Care - HMO | Admitting: *Deleted

## 2014-06-16 DIAGNOSIS — E785 Hyperlipidemia, unspecified: Secondary | ICD-10-CM | POA: Diagnosis not present

## 2014-06-16 LAB — HEPATIC FUNCTION PANEL
ALBUMIN: 4.6 g/dL (ref 3.5–5.2)
ALK PHOS: 45 U/L (ref 39–117)
ALT: 30 U/L (ref 0–53)
AST: 27 U/L (ref 0–37)
BILIRUBIN DIRECT: 0.2 mg/dL (ref 0.0–0.3)
BILIRUBIN TOTAL: 0.7 mg/dL (ref 0.2–1.2)
Total Protein: 7.3 g/dL (ref 6.0–8.3)

## 2014-06-18 LAB — NMR LIPOPROFILE WITH LIPIDS
CHOLESTEROL, TOTAL: 93 mg/dL — AB (ref 100–199)
HDL Particle Number: 25.2 umol/L — ABNORMAL LOW (ref >=30.5)
HDL Size: 8.7 nm — ABNORMAL LOW (ref >=9.2)
HDL-C: 34 mg/dL — ABNORMAL LOW (ref >39)
LDL (calc): 34 mg/dL (ref 0–99)
LDL PARTICLE NUMBER: 508 nmol/L (ref <1000)
LDL SIZE: 19.9 nm (ref >=20.8)
LP-IR SCORE: 61 — AB (ref <=45)
Large HDL-P: 2.5 umol/L — ABNORMAL LOW (ref >=4.8)
Large VLDL-P: 3.1 nmol/L — ABNORMAL HIGH (ref <=2.7)
Small LDL Particle Number: 366 nmol/L (ref <=527)
TRIGLYCERIDES: 124 mg/dL (ref 0–149)
VLDL Size: 46 nm (ref <=46.6)

## 2014-07-20 DIAGNOSIS — R7309 Other abnormal glucose: Secondary | ICD-10-CM | POA: Diagnosis not present

## 2014-07-20 DIAGNOSIS — I482 Chronic atrial fibrillation: Secondary | ICD-10-CM | POA: Diagnosis not present

## 2014-07-20 DIAGNOSIS — I1310 Hypertensive heart and chronic kidney disease without heart failure, with stage 1 through stage 4 chronic kidney disease, or unspecified chronic kidney disease: Secondary | ICD-10-CM | POA: Diagnosis not present

## 2014-07-20 DIAGNOSIS — M79604 Pain in right leg: Secondary | ICD-10-CM | POA: Diagnosis not present

## 2014-07-20 DIAGNOSIS — Z1389 Encounter for screening for other disorder: Secondary | ICD-10-CM | POA: Diagnosis not present

## 2014-07-20 DIAGNOSIS — N183 Chronic kidney disease, stage 3 (moderate): Secondary | ICD-10-CM | POA: Diagnosis not present

## 2014-07-20 DIAGNOSIS — K219 Gastro-esophageal reflux disease without esophagitis: Secondary | ICD-10-CM | POA: Diagnosis not present

## 2014-08-08 DIAGNOSIS — J189 Pneumonia, unspecified organism: Secondary | ICD-10-CM

## 2014-08-08 HISTORY — DX: Pneumonia, unspecified organism: J18.9

## 2014-08-22 ENCOUNTER — Inpatient Hospital Stay (HOSPITAL_COMMUNITY)
Admission: EM | Admit: 2014-08-22 | Discharge: 2014-08-26 | DRG: 308 | Disposition: A | Discharge status: Home / Self Care | Payer: Commercial Managed Care - HMO | Attending: Internal Medicine | Admitting: Internal Medicine

## 2014-08-22 ENCOUNTER — Encounter (HOSPITAL_COMMUNITY): Payer: Self-pay | Admitting: Nurse Practitioner

## 2014-08-22 ENCOUNTER — Emergency Department (HOSPITAL_COMMUNITY): Payer: Commercial Managed Care - HMO

## 2014-08-22 DIAGNOSIS — E780 Pure hypercholesterolemia: Secondary | ICD-10-CM | POA: Diagnosis present

## 2014-08-22 DIAGNOSIS — I10 Essential (primary) hypertension: Secondary | ICD-10-CM | POA: Diagnosis present

## 2014-08-22 DIAGNOSIS — I509 Heart failure, unspecified: Secondary | ICD-10-CM

## 2014-08-22 DIAGNOSIS — K2270 Barrett's esophagus without dysplasia: Secondary | ICD-10-CM | POA: Diagnosis present

## 2014-08-22 DIAGNOSIS — I472 Ventricular tachycardia: Secondary | ICD-10-CM | POA: Diagnosis present

## 2014-08-22 DIAGNOSIS — Z888 Allergy status to other drugs, medicaments and biological substances status: Secondary | ICD-10-CM

## 2014-08-22 DIAGNOSIS — Z951 Presence of aortocoronary bypass graft: Secondary | ICD-10-CM | POA: Diagnosis not present

## 2014-08-22 DIAGNOSIS — E1165 Type 2 diabetes mellitus with hyperglycemia: Secondary | ICD-10-CM | POA: Diagnosis not present

## 2014-08-22 DIAGNOSIS — I480 Paroxysmal atrial fibrillation: Secondary | ICD-10-CM | POA: Diagnosis not present

## 2014-08-22 DIAGNOSIS — R0602 Shortness of breath: Secondary | ICD-10-CM | POA: Diagnosis not present

## 2014-08-22 DIAGNOSIS — I5031 Acute diastolic (congestive) heart failure: Secondary | ICD-10-CM | POA: Diagnosis not present

## 2014-08-22 DIAGNOSIS — I350 Nonrheumatic aortic (valve) stenosis: Secondary | ICD-10-CM | POA: Diagnosis present

## 2014-08-22 DIAGNOSIS — B029 Zoster without complications: Secondary | ICD-10-CM | POA: Diagnosis not present

## 2014-08-22 DIAGNOSIS — I5043 Acute on chronic combined systolic (congestive) and diastolic (congestive) heart failure: Secondary | ICD-10-CM | POA: Diagnosis present

## 2014-08-22 DIAGNOSIS — I2510 Atherosclerotic heart disease of native coronary artery without angina pectoris: Secondary | ICD-10-CM | POA: Diagnosis present

## 2014-08-22 DIAGNOSIS — I429 Cardiomyopathy, unspecified: Secondary | ICD-10-CM | POA: Diagnosis present

## 2014-08-22 DIAGNOSIS — Z952 Presence of prosthetic heart valve: Secondary | ICD-10-CM | POA: Diagnosis not present

## 2014-08-22 DIAGNOSIS — I4891 Unspecified atrial fibrillation: Secondary | ICD-10-CM | POA: Diagnosis present

## 2014-08-22 DIAGNOSIS — Z7901 Long term (current) use of anticoagulants: Secondary | ICD-10-CM

## 2014-08-22 DIAGNOSIS — E875 Hyperkalemia: Secondary | ICD-10-CM | POA: Diagnosis not present

## 2014-08-22 DIAGNOSIS — K219 Gastro-esophageal reflux disease without esophagitis: Secondary | ICD-10-CM | POA: Diagnosis present

## 2014-08-22 DIAGNOSIS — N184 Chronic kidney disease, stage 4 (severe): Secondary | ICD-10-CM | POA: Diagnosis present

## 2014-08-22 DIAGNOSIS — Z79899 Other long term (current) drug therapy: Secondary | ICD-10-CM

## 2014-08-22 DIAGNOSIS — R079 Chest pain, unspecified: Secondary | ICD-10-CM | POA: Diagnosis not present

## 2014-08-22 DIAGNOSIS — N183 Chronic kidney disease, stage 3 (moderate): Secondary | ICD-10-CM | POA: Diagnosis not present

## 2014-08-22 DIAGNOSIS — I129 Hypertensive chronic kidney disease with stage 1 through stage 4 chronic kidney disease, or unspecified chronic kidney disease: Secondary | ICD-10-CM | POA: Diagnosis present

## 2014-08-22 DIAGNOSIS — Z953 Presence of xenogenic heart valve: Secondary | ICD-10-CM | POA: Diagnosis not present

## 2014-08-22 DIAGNOSIS — I5042 Chronic combined systolic (congestive) and diastolic (congestive) heart failure: Secondary | ICD-10-CM | POA: Diagnosis present

## 2014-08-22 DIAGNOSIS — I5033 Acute on chronic diastolic (congestive) heart failure: Secondary | ICD-10-CM | POA: Diagnosis not present

## 2014-08-22 DIAGNOSIS — I5021 Acute systolic (congestive) heart failure: Secondary | ICD-10-CM | POA: Diagnosis not present

## 2014-08-22 LAB — CBC WITH DIFFERENTIAL/PLATELET
BASOS ABS: 0 10*3/uL (ref 0.0–0.1)
Basophils Relative: 0 % (ref 0–1)
EOS PCT: 0 % (ref 0–5)
Eosinophils Absolute: 0 10*3/uL (ref 0.0–0.7)
HCT: 41.2 % (ref 39.0–52.0)
Hemoglobin: 13.6 g/dL (ref 13.0–17.0)
LYMPHS ABS: 0.9 10*3/uL (ref 0.7–4.0)
Lymphocytes Relative: 7 % — ABNORMAL LOW (ref 12–46)
MCH: 29.1 pg (ref 26.0–34.0)
MCHC: 33 g/dL (ref 30.0–36.0)
MCV: 88.2 fL (ref 78.0–100.0)
MONO ABS: 1.4 10*3/uL — AB (ref 0.1–1.0)
Monocytes Relative: 11 % (ref 3–12)
NEUTROS ABS: 10.3 10*3/uL — AB (ref 1.7–7.7)
Neutrophils Relative %: 82 % — ABNORMAL HIGH (ref 43–77)
PLATELETS: 218 10*3/uL (ref 150–400)
RBC: 4.67 MIL/uL (ref 4.22–5.81)
RDW: 14.1 % (ref 11.5–15.5)
WBC: 12.5 10*3/uL — AB (ref 4.0–10.5)

## 2014-08-22 LAB — COMPREHENSIVE METABOLIC PANEL
ALT: 22 U/L (ref 17–63)
AST: 39 U/L (ref 15–41)
Albumin: 4.1 g/dL (ref 3.5–5.0)
Alkaline Phosphatase: 44 U/L (ref 38–126)
Anion gap: 11 (ref 5–15)
BILIRUBIN TOTAL: 1.5 mg/dL — AB (ref 0.3–1.2)
BUN: 29 mg/dL — AB (ref 6–20)
CO2: 21 mmol/L — AB (ref 22–32)
Calcium: 8.7 mg/dL — ABNORMAL LOW (ref 8.9–10.3)
Chloride: 101 mmol/L (ref 101–111)
Creatinine, Ser: 1.8 mg/dL — ABNORMAL HIGH (ref 0.61–1.24)
GFR, EST AFRICAN AMERICAN: 40 mL/min — AB (ref >60)
GFR, EST NON AFRICAN AMERICAN: 34 mL/min — AB (ref >60)
Glucose, Bld: 171 mg/dL — ABNORMAL HIGH (ref 65–99)
Potassium: 5.5 mmol/L — ABNORMAL HIGH (ref 3.5–5.1)
Sodium: 133 mmol/L — ABNORMAL LOW (ref 135–145)
TOTAL PROTEIN: 7.1 g/dL (ref 6.5–8.1)

## 2014-08-22 LAB — I-STAT CHEM 8, ED
BUN: 45 mg/dL — ABNORMAL HIGH (ref 6–20)
CREATININE: 1.8 mg/dL — AB (ref 0.61–1.24)
Calcium, Ion: 1.04 mmol/L — ABNORMAL LOW (ref 1.13–1.30)
Chloride: 102 mmol/L (ref 101–111)
Glucose, Bld: 169 mg/dL — ABNORMAL HIGH (ref 65–99)
HEMATOCRIT: 46 % (ref 39.0–52.0)
Hemoglobin: 15.6 g/dL (ref 13.0–17.0)
POTASSIUM: 6.6 mmol/L — AB (ref 3.5–5.1)
Sodium: 133 mmol/L — ABNORMAL LOW (ref 135–145)
TCO2: 21 mmol/L (ref 0–100)

## 2014-08-22 LAB — BRAIN NATRIURETIC PEPTIDE: B NATRIURETIC PEPTIDE 5: 592.3 pg/mL — AB (ref 0.0–100.0)

## 2014-08-22 LAB — I-STAT TROPONIN, ED: Troponin i, poc: 0.01 ng/mL (ref 0.00–0.08)

## 2014-08-22 MED ORDER — FUROSEMIDE 10 MG/ML IJ SOLN
40.0000 mg | Freq: Once | INTRAMUSCULAR | Status: AC
  Administered 2014-08-22: 40 mg via INTRAVENOUS
  Filled 2014-08-22: qty 4

## 2014-08-22 MED ORDER — DILTIAZEM HCL 25 MG/5ML IV SOLN
20.0000 mg | Freq: Once | INTRAVENOUS | Status: AC
  Administered 2014-08-22: 20 mg via INTRAVENOUS
  Filled 2014-08-22: qty 5

## 2014-08-22 MED ORDER — MORPHINE SULFATE 4 MG/ML IJ SOLN
4.0000 mg | Freq: Once | INTRAMUSCULAR | Status: AC
  Administered 2014-08-22: 4 mg via INTRAVENOUS
  Filled 2014-08-22: qty 1

## 2014-08-22 MED ORDER — SODIUM POLYSTYRENE SULFONATE 15 GM/60ML PO SUSP
15.0000 g | Freq: Once | ORAL | Status: AC
  Administered 2014-08-22: 15 g via ORAL
  Filled 2014-08-22: qty 60

## 2014-08-22 MED ORDER — VALACYCLOVIR HCL 500 MG PO TABS
500.0000 mg | ORAL_TABLET | Freq: Two times a day (BID) | ORAL | Status: DC
  Administered 2014-08-22: 500 mg via ORAL
  Filled 2014-08-22: qty 1

## 2014-08-22 NOTE — ED Notes (Signed)
Bed: WA01 Expected date: 08/22/14 Expected time: 7:56 PM Means of arrival: Ambulance Comments: Short of breath, Afib

## 2014-08-22 NOTE — ED Provider Notes (Signed)
CSN: [id number]     Arrival date & time 08/22/14  2010 History   First MD Initiated Contact with Patient 08/22/14 2023     Chief Complaint  Patient presents with  . Shortness of Breath     (Consider location/radiation/quality/duration/timing/severity/associated sxs/prior Treatment) HPI Comments: Patient is a 81 year old male with a past medical history of hypertension, CHF, afib, diabetes, and CAD who presents with SOB that started around 3pm. Symptoms started suddenly and remained constant since the onset. Exertion makes the symptoms worse. No alleviating factors. Patient endorses nausea and diaphoresis. Patient reports associated right shoulder and back pain for the past 2-3 days. No radiation of the pain. Patient has not tried anything for symptoms. Patient reports his lasix dose was recently reduced from 16m to 134m Patient denies any left chest pain, fever, cough.    Past Medical History  Diagnosis Date  . Heart murmur   . Hypercholesteremia   . HTN (hypertension)   . CHF (congestive heart failure)     2D echo 12/2010 - nl LVF, mild LAE, MAC with moderately thickened MV leaflets and trivial MR, normal functioning prosthetic AVR with mild increased gradient, grade II diastolic dysfunction   . Hypotension   . Orthostatic hypotension   . CKD (chronic kidney disease)   . Aortic valve disorder 2009    severe AS s/p AVR with pericardial tissue valve  . Diabetes   . Aortic stenosis   . Barrett esophagus   . CAD (coronary artery disease)     s/p 2 vessel CABG with LIMA to LAD, SVG to diagonal 1, SVG to RCA 12/09  . Cardiomyopathy     EF now normal by echo 2012 at 51%  . H/O: rheumatic fever   . Atrial fibrillation     post op afib with no reoccurence  . Diastolic dysfunction    Past Surgical History  Procedure Laterality Date  . Coronary artery bypass graft      x 3 Dr. HeRoxan Hockey. Avr with pericardial tissue valve    . Intra-ocular lens os dr haAnders Simmonds  . Cardioversion  N/A 01/27/2013    Procedure: CARDIOVERSION;  Surgeon: TrSueanne MargaritaMD;  Location: [facility];  Service: Cardiovascular;  Laterality: N/A;   Family History  Problem Relation Age of Onset  . Alzheimer's disease    . Alzheimer's disease Mother    History  Substance Use Topics  . Smoking status: Never Smoker   . Smokeless tobacco: Not on file  . Alcohol Use: No    Review of Systems  Constitutional: Negative for fever, chills and fatigue.  HENT: Negative for trouble swallowing.   Eyes: Negative for visual disturbance.  Respiratory: Positive for shortness of breath.   Cardiovascular: Positive for chest pain. Negative for palpitations.  Gastrointestinal: Negative for nausea, vomiting, abdominal pain and diarrhea.  Genitourinary: Negative for dysuria and difficulty urinating.  Musculoskeletal: Negative for arthralgias and neck pain.  Skin: Negative for color change.  Neurological: Negative for dizziness and weakness.  Psychiatric/Behavioral: Negative for dysphoric mood.      Allergies  Novocain  Home Medications   Prior to Admission medications   Medication Sig Start Date End Date Taking? Authorizing Provider  amLODipine (NORVASC) 10 MG tablet Take 5 mg by mouth 2 (two) times daily.    Yes Historical Provider, MD  apixaban (ELIQUIS) 5 MG TABS tablet Take 1 tablet (5 mg total) by mouth 2 (two) times daily. 02/19/13  Yes TrSueanne Margarita  MD  atenolol (TENORMIN) 25 MG tablet 1 tab in the am and 2 tabs in the pm to total 75 MG daily. 02/16/13  Yes Sueanne Margarita, MD  atorvastatin (LIPITOR) 20 MG tablet Take 20 mg by mouth daily.   Yes Historical Provider, MD  furosemide (LASIX) 20 MG tablet Take 10 mg by mouth daily. 01/26/13  Yes Sueanne Margarita, MD  lisinopril (PRINIVIL,ZESTRIL) 40 MG tablet Take 40 mg by mouth daily.   Yes Historical Provider, MD  Magnesium 250 MG TABS Take 1 tablet by mouth daily.    Yes Historical Provider, MD  Multiple Vitamin (MULTIVITAMIN) capsule Take 1  capsule by mouth daily.   Yes Historical Provider, MD  omega-3 acid ethyl esters (LOVAZA) 1 G capsule Take 2 g by mouth 2 (two) times daily.   Yes Historical Provider, MD  omeprazole (PRILOSEC) 20 MG capsule Take 20 mg by mouth daily.   Yes Historical Provider, MD  Potassium Gluconate 595 MG CAPS Take 1 capsule by mouth daily.   Yes Historical Provider, MD  traMADol (ULTRAM) 50 MG tablet Take 50 mg by mouth every 6 (six) hours as needed for moderate pain or severe pain (pain).   Yes Historical Provider, MD   BP 123/82 mmHg  Pulse 124  Temp(Src) 98.1 F (36.7 C) (Oral)  Ht 6' (1.829 m)  Wt 210 lb (95.255 kg)  BMI 28.47 kg/m2  SpO2 99% Physical Exam  Constitutional: He is oriented to person, place, and time. He appears well-developed and well-nourished. No distress.  HENT:  Head: Normocephalic and atraumatic.  Eyes: Conjunctivae and EOM are normal.  Neck: Normal range of motion.  Cardiovascular: Normal rate, regular rhythm and intact distal pulses.  Exam reveals no gallop and no friction rub.   No murmur heard. 2+ lower extremity pitting edema.   Pulmonary/Chest: Effort normal. He has no wheezes. He has no rales. He exhibits no tenderness.  Patient wear nasal cannula. Crackles noted at bilateral lung bases.   Abdominal: Soft. He exhibits no distension. There is no tenderness. There is no rebound.  Musculoskeletal: Normal range of motion.  No calf tenderness to palpation.   Neurological: He is alert and oriented to person, place, and time. Coordination normal.  Speech is goal-oriented. Moves limbs without ataxia.   Skin: Skin is warm and dry.  Erythematous clustered lesions of right upper back and right anterior chest that follows a dermatomal pattern. No open wound.   Psychiatric: He has a normal mood and affect. His behavior is normal.  Nursing note and vitals reviewed.   ED Course  Procedures (including critical care time) Labs Review Labs Reviewed  CBC WITH  DIFFERENTIAL/PLATELET - Abnormal; Notable for the following:    WBC 12.5 (*)    Neutrophils Relative % 82 (*)    Neutro Abs 10.3 (*)    Lymphocytes Relative 7 (*)    Monocytes Absolute 1.4 (*)    All other components within normal limits  BRAIN NATRIURETIC PEPTIDE - Abnormal; Notable for the following:    B Natriuretic Peptide 592.3 (*)    All other components within normal limits  COMPREHENSIVE METABOLIC PANEL - Abnormal; Notable for the following:    Sodium 133 (*)    Potassium 5.5 (*)    CO2 21 (*)    Glucose, Bld 171 (*)    BUN 29 (*)    Creatinine, Ser 1.80 (*)    Calcium 8.7 (*)    Total Bilirubin 1.5 (*)    GFR  calc non Af Amer 34 (*)    GFR calc Af Amer 40 (*)    All other components within normal limits  I-STAT CHEM 8, ED - Abnormal; Notable for the following:    Sodium 133 (*)    Potassium 6.6 (*)    BUN 45 (*)    Creatinine, Ser 1.80 (*)    Glucose, Bld 169 (*)    Calcium, Ion 1.04 (*)    All other components within normal limits  Randolm Idol, ED    Imaging Review Dg Chest Port 1 View  08/22/2014   CLINICAL DATA:  Sudden onset of shortness of breath and chest pain since this afternoon. Pain radiates to the right side and shoulder.  EXAM: PORTABLE CHEST - 1 VIEW  COMPARISON:  05/25/2008  FINDINGS: Patient is post CABG with intact sternal wires and multiple mediastinal clips. The heart is enlarged. Linear subpleural opacities in the right greater than left lower lungs, may reflect atelectasis versus minimal pulmonary edema. There is no vascular congestion. Mild hyperinflation, suspect underlying emphysema. No confluent airspace disease or pneumothorax. No large pleural effusion. No acute osseous abnormalities are seen.  IMPRESSION: 1. Cardiomegaly. Linear subpleural opacities may reflect minimal pulmonary edema versus atelectasis. 2. Mild hyperinflation.   Electronically Signed   By: Jeb Levering M.D.   On: 08/22/2014 21:14     EKG  Interpretation   Date/Time:  Sunday Aug 22 2014 20:51:06 EDT Ventricular Rate:  103 PR Interval:    QRS Duration: 165 QT Interval:  390 QTC Calculation: 510 R Axis:   -74 Text Interpretation:  Atrial fibrillation Left bundle branch block  Confirmed by YAO  MD, DAVID ([id number]) on 08/22/2014 8:54:16 PM      MDM   Final diagnoses:  SOB (shortness of breath)  Acute congestive heart failure, unspecified congestive heart failure type  Shingles  Atrial fibrillation, unspecified    10:18 PM Patient slightly fluid overloaded from CHF. Patient will be diuresed. Patient has shingles on the right thoracic and anterior chest area. Potassium mildly elevated and he will receive kayexalate. Patient is rate controlled after cardizem.     Alvina Chou, PA-C 08/22/14 2356  Wandra Arthurs, MD 08/24/14 [phone]

## 2014-08-22 NOTE — ED Notes (Signed)
Hold lab draw per RN at this time.

## 2014-08-22 NOTE — ED Notes (Signed)
Pt presents with c/o shortness of breath, states he woke up this afternoon from his after lunch nap with the onset of shortness of breath and a chest pain that is shooting and radiating to the back on his right side.

## 2014-08-22 NOTE — ED Notes (Signed)
Please update Festus Pursel [phone] on pt's progress on behalf of the family.

## 2014-08-23 ENCOUNTER — Encounter (HOSPITAL_COMMUNITY): Payer: Self-pay | Admitting: Internal Medicine

## 2014-08-23 ENCOUNTER — Inpatient Hospital Stay (HOSPITAL_COMMUNITY): Payer: Commercial Managed Care - HMO

## 2014-08-23 DIAGNOSIS — B029 Zoster without complications: Secondary | ICD-10-CM | POA: Diagnosis present

## 2014-08-23 DIAGNOSIS — E875 Hyperkalemia: Secondary | ICD-10-CM | POA: Diagnosis present

## 2014-08-23 DIAGNOSIS — N183 Chronic kidney disease, stage 3 unspecified: Secondary | ICD-10-CM

## 2014-08-23 DIAGNOSIS — I4891 Unspecified atrial fibrillation: Secondary | ICD-10-CM | POA: Diagnosis present

## 2014-08-23 DIAGNOSIS — I5031 Acute diastolic (congestive) heart failure: Secondary | ICD-10-CM

## 2014-08-23 DIAGNOSIS — N184 Chronic kidney disease, stage 4 (severe): Secondary | ICD-10-CM | POA: Diagnosis present

## 2014-08-23 DIAGNOSIS — I509 Heart failure, unspecified: Secondary | ICD-10-CM

## 2014-08-23 HISTORY — DX: Chronic kidney disease, stage 3 unspecified: N18.30

## 2014-08-23 HISTORY — DX: Zoster without complications: B02.9

## 2014-08-23 HISTORY — DX: Hyperkalemia: E87.5

## 2014-08-23 LAB — BASIC METABOLIC PANEL
ANION GAP: 13 (ref 5–15)
BUN: 28 mg/dL — ABNORMAL HIGH (ref 6–20)
CALCIUM: 8.8 mg/dL — AB (ref 8.9–10.3)
CHLORIDE: 101 mmol/L (ref 101–111)
CO2: 22 mmol/L (ref 22–32)
Creatinine, Ser: 1.75 mg/dL — ABNORMAL HIGH (ref 0.61–1.24)
GFR calc non Af Amer: 35 mL/min — ABNORMAL LOW (ref >60)
GFR, EST AFRICAN AMERICAN: 41 mL/min — AB (ref >60)
Glucose, Bld: 139 mg/dL — ABNORMAL HIGH (ref 65–99)
Potassium: 4.2 mmol/L (ref 3.5–5.1)
SODIUM: 136 mmol/L (ref 135–145)

## 2014-08-23 LAB — CBC WITH DIFFERENTIAL/PLATELET
BASOS ABS: 0 10*3/uL (ref 0.0–0.1)
BASOS PCT: 0 % (ref 0–1)
EOS ABS: 0 10*3/uL (ref 0.0–0.7)
EOS PCT: 0 % (ref 0–5)
HCT: 41.2 % (ref 39.0–52.0)
HEMOGLOBIN: 13.7 g/dL (ref 13.0–17.0)
LYMPHS ABS: 1.6 10*3/uL (ref 0.7–4.0)
Lymphocytes Relative: 16 % (ref 12–46)
MCH: 29.1 pg (ref 26.0–34.0)
MCHC: 33.3 g/dL (ref 30.0–36.0)
MCV: 87.7 fL (ref 78.0–100.0)
Monocytes Absolute: 1.8 10*3/uL — ABNORMAL HIGH (ref 0.1–1.0)
Monocytes Relative: 18 % — ABNORMAL HIGH (ref 3–12)
Neutro Abs: 6.9 10*3/uL (ref 1.7–7.7)
Neutrophils Relative %: 66 % (ref 43–77)
Platelets: 188 10*3/uL (ref 150–400)
RBC: 4.7 MIL/uL (ref 4.22–5.81)
RDW: 14.1 % (ref 11.5–15.5)
WBC: 10.4 10*3/uL (ref 4.0–10.5)

## 2014-08-23 LAB — TSH: TSH: 1.055 u[IU]/mL (ref 0.350–4.500)

## 2014-08-23 LAB — MAGNESIUM: MAGNESIUM: 2.2 mg/dL (ref 1.7–2.4)

## 2014-08-23 LAB — TROPONIN I
Troponin I: 0.03 ng/mL (ref <0.031)
Troponin I: 0.03 ng/mL (ref <0.031)
Troponin I: 0.03 ng/mL (ref <0.031)

## 2014-08-23 MED ORDER — MAGNESIUM OXIDE 400 (241.3 MG) MG PO TABS
400.0000 mg | ORAL_TABLET | Freq: Every day | ORAL | Status: DC
  Administered 2014-08-23 – 2014-08-26 (×4): 400 mg via ORAL
  Filled 2014-08-23 (×4): qty 1

## 2014-08-23 MED ORDER — ATORVASTATIN CALCIUM 10 MG PO TABS
20.0000 mg | ORAL_TABLET | Freq: Every day | ORAL | Status: DC
  Administered 2014-08-23 – 2014-08-25 (×3): 20 mg via ORAL
  Filled 2014-08-23 (×3): qty 2

## 2014-08-23 MED ORDER — ATENOLOL 25 MG PO TABS
25.0000 mg | ORAL_TABLET | Freq: Every day | ORAL | Status: DC
Start: 2014-08-23 — End: 2014-08-26
  Administered 2014-08-23 – 2014-08-26 (×4): 25 mg via ORAL
  Filled 2014-08-23 (×4): qty 1

## 2014-08-23 MED ORDER — ONDANSETRON HCL 4 MG PO TABS: 4.0000 mg | ORAL_TABLET | Freq: Four times a day (QID) | ORAL | Status: DC | PRN

## 2014-08-23 MED ORDER — ONDANSETRON HCL 4 MG/2ML IJ SOLN: 4.0000 mg | Freq: Three times a day (TID) | INTRAMUSCULAR | Status: DC | PRN

## 2014-08-23 MED ORDER — FUROSEMIDE 10 MG/ML IJ SOLN
40.0000 mg | Freq: Every day | INTRAMUSCULAR | Status: DC
  Administered 2014-08-23: 40 mg via INTRAVENOUS
  Filled 2014-08-23: qty 4

## 2014-08-23 MED ORDER — ACETAMINOPHEN 650 MG RE SUPP: 650.0000 mg | Freq: Four times a day (QID) | RECTAL | Status: DC | PRN

## 2014-08-23 MED ORDER — LISINOPRIL 20 MG PO TABS
40.0000 mg | ORAL_TABLET | Freq: Every day | ORAL | Status: DC
  Administered 2014-08-23: 40 mg via ORAL
  Filled 2014-08-23: qty 2

## 2014-08-23 MED ORDER — OMEGA-3-ACID ETHYL ESTERS 1 G PO CAPS
2.0000 g | ORAL_CAPSULE | Freq: Two times a day (BID) | ORAL | Status: DC
  Administered 2014-08-23 – 2014-08-26 (×7): 2 g via ORAL
  Filled 2014-08-23 (×7): qty 2

## 2014-08-23 MED ORDER — AMLODIPINE BESYLATE 5 MG PO TABS
5.0000 mg | ORAL_TABLET | Freq: Two times a day (BID) | ORAL | Status: DC
  Administered 2014-08-23 – 2014-08-26 (×7): 5 mg via ORAL
  Filled 2014-08-23 (×7): qty 1

## 2014-08-23 MED ORDER — SODIUM CHLORIDE 0.9 % IJ SOLN
3.0000 mL | Freq: Two times a day (BID) | INTRAMUSCULAR | Status: DC
  Administered 2014-08-23 – 2014-08-26 (×7): 3 mL via INTRAVENOUS

## 2014-08-23 MED ORDER — VALACYCLOVIR HCL 500 MG PO TABS
1000.0000 mg | ORAL_TABLET | Freq: Three times a day (TID) | ORAL | Status: DC
  Administered 2014-08-23 – 2014-08-26 (×11): 1000 mg via ORAL
  Filled 2014-08-23 (×17): qty 2

## 2014-08-23 MED ORDER — ADULT MULTIVITAMIN W/MINERALS CH
1.0000 | ORAL_TABLET | Freq: Every day | ORAL | Status: DC
Start: 2014-08-23 — End: 2014-08-26
  Administered 2014-08-23 – 2014-08-26 (×4): 1 via ORAL
  Filled 2014-08-23 (×4): qty 1

## 2014-08-23 MED ORDER — ACETAMINOPHEN 325 MG PO TABS: 650.0000 mg | ORAL_TABLET | Freq: Four times a day (QID) | ORAL | Status: DC | PRN

## 2014-08-23 MED ORDER — APIXABAN 2.5 MG PO TABS
5.0000 mg | ORAL_TABLET | Freq: Two times a day (BID) | ORAL | Status: DC
  Administered 2014-08-23 – 2014-08-26 (×7): 5 mg via ORAL
  Filled 2014-08-23 (×7): qty 2

## 2014-08-23 MED ORDER — TRAMADOL HCL 50 MG PO TABS
50.0000 mg | ORAL_TABLET | Freq: Four times a day (QID) | ORAL | Status: DC | PRN
  Administered 2014-08-23 – 2014-08-26 (×4): 50 mg via ORAL
  Filled 2014-08-23 (×4): qty 1

## 2014-08-23 MED ORDER — ONDANSETRON HCL 4 MG/2ML IJ SOLN: 4.0000 mg | Freq: Four times a day (QID) | INTRAMUSCULAR | Status: DC | PRN

## 2014-08-23 MED ORDER — PANTOPRAZOLE SODIUM 40 MG PO TBEC
40.0000 mg | DELAYED_RELEASE_TABLET | Freq: Every day | ORAL | Status: DC
  Administered 2014-08-23 – 2014-08-26 (×4): 40 mg via ORAL
  Filled 2014-08-23 (×4): qty 1

## 2014-08-23 MED ORDER — ATENOLOL 50 MG PO TABS
50.0000 mg | ORAL_TABLET | Freq: Every day | ORAL | Status: DC
  Administered 2014-08-23 – 2014-08-25 (×3): 50 mg via ORAL
  Filled 2014-08-23 (×3): qty 1

## 2014-08-23 NOTE — H&P (Signed)
[facility] History and Physical  [name] YKZ:[id number] DOB: November 17, 1932 DOA: 08/22/2014  Referring physician: Ms.Kaitlyn.PA. PCP: Pcp Not In System Dr. Briscoe Deutscher. PCP. Specialists: Dr. [name]. Cardiologist.  Chief Complaint: Shortness of breath.  HPI: [name] is a 81 y.o. male with history of atrial fibrillation, diastolic CHF last EF measured in 2012 was 51%, history of CAD status post CABG, bioprosthetic aortic valve replacement, chronic and disease stage III presents to the ER because of increasing shortness of breath. Patient's shortness of breath started last evening which was present even at rest and increases on exertion. Patient states he has been noticing increasing lower extremity edema over the last few days. Patient also states he has gained at least 6 pounds in weight. Patient states he usually is on Lasix 40 mg but was decreased many months ago by patient's cardiologist because of worsening renal function. Patient denies any chest pain fever chills have been having some nonproductive cough. Chest x-ray shows features concerning for CHF. Patient was given Lasix 40 mg IV and admitted for further management. Patient's metabolic panel shows hyperkalemia. Initially patient's heart rate on admission was A. fib with RVR and improved with 20 mg IV Cardizem bolus. In addition patient also has a rash on the right side of her chest to a particular dermatome concerning for shingles. Patient states he noticed the rash started last week which is progress. Denies any pain.   Review of Systems: As presented in the history of presenting illness, rest negative.  Past Medical History  Diagnosis Date  . Heart murmur   . Hypercholesteremia   . HTN (hypertension)   . CHF (congestive heart failure)     2D echo 12/2010 - nl LVF, mild LAE, MAC with moderately thickened MV leaflets and trivial MR, normal functioning prosthetic AVR with mild increased gradient, grade II  diastolic dysfunction   . Hypotension   . Orthostatic hypotension   . CKD (chronic kidney disease)   . Aortic valve disorder 2009    severe AS s/p AVR with pericardial tissue valve  . Diabetes   . Aortic stenosis   . Barrett esophagus   . CAD (coronary artery disease)     s/p 2 vessel CABG with LIMA to LAD, SVG to diagonal 1, SVG to RCA 12/09  . Cardiomyopathy     EF now normal by echo 2012 at 51%  . H/O: rheumatic fever   . Atrial fibrillation     post op afib with no reoccurence  . Diastolic dysfunction    Past Surgical History  Procedure Laterality Date  . Coronary artery bypass graft      x 3 Dr. Roxan Hockey  . Avr with pericardial tissue valve    . Intra-ocular lens os dr Anders Simmonds    . Cardioversion N/A 01/27/2013    Procedure: CARDIOVERSION;  Surgeon: Sueanne Margarita, MD;  Location: [facility];  Service: Cardiovascular;  Laterality: N/A;   Social History:  reports that he has never smoked. He does not have any smokeless tobacco history on file. He reports that he does not drink alcohol or use illicit drugs. Where does patient live home. Can patient participate in ADLs? Yes.  Allergies  Allergen Reactions  . Novocain [Procaine] Swelling    Family History:  Family History  Problem Relation Age of Onset  . Alzheimer's disease    . Alzheimer's disease Mother       Prior to Admission medications   Medication Sig Start Date End  Date Taking? Authorizing Provider  amLODipine (NORVASC) 10 MG tablet Take 5 mg by mouth 2 (two) times daily.    Yes Historical Provider, MD  apixaban (ELIQUIS) 5 MG TABS tablet Take 1 tablet (5 mg total) by mouth 2 (two) times daily. 02/19/13  Yes Sueanne Margarita, MD  atenolol (TENORMIN) 25 MG tablet 1 tab in the am and 2 tabs in the pm to total 75 MG daily. 02/16/13  Yes Sueanne Margarita, MD  atorvastatin (LIPITOR) 20 MG tablet Take 20 mg by mouth daily.   Yes Historical Provider, MD  furosemide (LASIX) 20 MG tablet Take 10 mg by mouth daily.  01/26/13  Yes Sueanne Margarita, MD  lisinopril (PRINIVIL,ZESTRIL) 40 MG tablet Take 40 mg by mouth daily.   Yes Historical Provider, MD  Magnesium 250 MG TABS Take 1 tablet by mouth daily.    Yes Historical Provider, MD  Multiple Vitamin (MULTIVITAMIN) capsule Take 1 capsule by mouth daily.   Yes Historical Provider, MD  omega-3 acid ethyl esters (LOVAZA) 1 G capsule Take 2 g by mouth 2 (two) times daily.   Yes Historical Provider, MD  omeprazole (PRILOSEC) 20 MG capsule Take 20 mg by mouth daily.   Yes Historical Provider, MD  Potassium Gluconate 595 MG CAPS Take 1 capsule by mouth daily.   Yes Historical Provider, MD  traMADol (ULTRAM) 50 MG tablet Take 50 mg by mouth every 6 (six) hours as needed for moderate pain or severe pain (pain).   Yes Historical Provider, MD    Physical Exam: Filed Vitals:   08/22/14 2019 08/22/14 2330  BP: 123/82 139/83  Pulse: 124 96  Temp: 98.1 F (36.7 C) 97.7 F (36.5 C)  TempSrc: Oral Oral  Resp:  20  Height: 6' (1.829 m) 6' (1.829 m)  Weight: 95.255 kg (210 lb) 97.75 kg (215 lb 8 oz)  SpO2: 99% 96%     General:  Well-built and moderately nourished.  Eyes: Anicteric. No pallor.  ENT: No discharge from the ears eyes nose and mouth.  Neck: No mass felt. JVD not appreciated.  Cardiovascular: S1 and S2 heard.  Respiratory: No rhonchi or crepitations.  Abdomen: Soft nontender bowel sounds present.  Skin: Rash on the right side of her chest concerning for shingles.  Musculoskeletal: Mild lower extremity edema.  Psychiatric: Appears normal.  Neurologic: Alert awake oriented to time place and person. Moves all extremities.  Labs on Admission:  Basic Metabolic Panel:  Recent Labs Lab 08/22/14 2044 08/22/14 2102  NA 133* 133*  K 5.5* 6.6*  CL 101 102  CO2 21*  --   GLUCOSE 171* 169*  BUN 29* 45*  CREATININE 1.80* 1.80*  CALCIUM 8.7*  --    Liver Function Tests:  Recent Labs Lab 08/22/14 2044  AST 39  ALT 22  ALKPHOS 44   BILITOT 1.5*  PROT 7.1  ALBUMIN 4.1   No results for input(s): LIPASE, AMYLASE in the last 168 hours. No results for input(s): AMMONIA in the last 168 hours. CBC:  Recent Labs Lab 08/22/14 2044 08/22/14 2102  WBC 12.5*  --   NEUTROABS 10.3*  --   HGB 13.6 15.6  HCT 41.2 46.0  MCV 88.2  --   PLT 218  --    Cardiac Enzymes: No results for input(s): CKTOTAL, CKMB, CKMBINDEX, TROPONINI in the last 168 hours.  BNP (last 3 results)  Recent Labs  08/22/14 2044  BNP 592.3*    ProBNP (last 3 results) No results  for input(s): PROBNP in the last 8760 hours.  CBG: No results for input(s): GLUCAP in the last 168 hours.  Radiological Exams on Admission: Dg Chest Port 1 View  08/22/2014   CLINICAL DATA:  Sudden onset of shortness of breath and chest pain since this afternoon. Pain radiates to the right side and shoulder.  EXAM: PORTABLE CHEST - 1 VIEW  COMPARISON:  05/25/2008  FINDINGS: Patient is post CABG with intact sternal wires and multiple mediastinal clips. The heart is enlarged. Linear subpleural opacities in the right greater than left lower lungs, may reflect atelectasis versus minimal pulmonary edema. There is no vascular congestion. Mild hyperinflation, suspect underlying emphysema. No confluent airspace disease or pneumothorax. No large pleural effusion. No acute osseous abnormalities are seen.  IMPRESSION: 1. Cardiomegaly. Linear subpleural opacities may reflect minimal pulmonary edema versus atelectasis. 2. Mild hyperinflation.   Electronically Signed   By: Jeb Levering M.D.   On: 08/22/2014 21:14    EKG: Independently reviewed. A. fib with RVR.  Assessment/Plan Principal Problem:   Diastolic CHF Active Problems:   HTN (hypertension)   Shingles   Atrial fibrillation with RVR   CKD (chronic kidney disease) stage 3, GFR 30-59 ml/min   Hyperkalemia   CHF (congestive heart failure)   1. Acute diastolic CHF probably precipitated by A. fib with RVR - at this  time patient has been placed on Lasix 40 mg IV daily patient did receive 1 dose in the ER. Patient's heart rate has improved with Cardizem bolus. Check cardiac markers 2-D echo and closely follow intake and output and metabolic panel. Patient is on ace inhibitors which may need to be held if patient's creatinine on patient's potassium does not improve. 2. A. fib with RVR - chads 2 vasc score is more than 2. Patient is on Apixaban. Which will be continued. Patient is on Toprol the present dose will be continued but may need titration if heart rate is still elevated. Check thyroid function tests. Check 2-D echo. 3. Shingles - patient has rash on the right side of the chest. Characteristic of shingles. I have discussed with pharmacist for the dosing of valacyclovir and presently patient is on 1 g 3 times a day for 7 days. If patient's creatinine clearance decreases less than 25 then may need further adjustment of patient's valacyclovir dose as per the pharmacist. 4. Chronic kidney disease stage III with hyperkalemia - patient did receive Lasix 40 mg IV in the ER which I think will improve patient's potassium levels and I'm holding all patient's potassium supplements. If there is any further worsening of patient's creatinine may have to hold lisinopril. Closely follow intake output and metabolic panel. 5. CAD status post CABG - cycle cardiac markers. Continue Apixaban and metoprolol. Check 2-D echo. 6. History of prosthetic aortic valve replacement - check 2-D echo. 7. Hyperglycemia - check hemoglobin A1c.  Patient is on airborne precautions secondary to shingles.   DVT Prophylaxis on Apixaban.  Code Status: Full code.  Family Communication: Discussed with patient.  Disposition Plan: Admit to inpatient. Likely stay 2-3 days.    Abiageal Blowe N. [facility] Pager [phone].  If 7PM-7AM, please contact night-coverage [email or web address] Password [email or web address] 08/23/2014, 1:18 AM

## 2014-08-23 NOTE — Progress Notes (Addendum)
PATIENT DETAILS Name: [name] Age: 81 y.o. Sex: male Date of Birth: [birth year] Admit Date: 08/22/2014 Admitting Physician Rise Patience, MD PCP:Pcp Not In System  Subjective: Shortness of breath much better.Heart rate much better as well.  Assessment/Plan: Principal Problem:   Acute Diastolic CHF: Precipitated by A. fib with RVR. Much more compensated with control in heart rate and with diuresis. Suspect very close to euvolemia, continue with IV Lasix for 1 more day.Repeat electrolytes in a.m.Await Echo  Active Problems:   Atrial fibrillation with RVR: Required IV Cardizem on admission, currently rate controlled with atenolol. Already anticoagulated with Eliquis. Remains in atrial fibrillation, will consult cardiology to see if candidate for cardioversion.Await Echo    NSVT: Keep potassium more than 4 and magnesium 1 and 2. Await echocardiogram    HTN (hypertension): controlled, continue atenolol, amlodipine. Discontinue Lisinopril given hyperkalemia on presentation.    Chronic kidney disease stage III: Creatinine close to usual baseline. Monitor closely while on IV diuretics.    Hyperkalemia: Resolved following Kayexalate.  Will d/c lisinopril.Follow electrolytes.    History of CAD: Status post CABG. Troponins negative. No chest pain. Suspect not on aspirin-as on Eliquis. Continue beta blocker and statin.    History of aortic valve replacement with bioprosthetic valve:await Echo    GERD: Continue PPI    Herpes zoster: Continue with Valtrex.   Disposition: Remain inpatient-Home next 1-2 days  Antimicrobial agents  See below  Anti-infectives    Start     Dose/Rate Route Frequency Ordered Stop   08/23/14 0600  valACYclovir (VALTREX) tablet 1,000 mg     1,000 mg Oral Every 8 hours 08/23/14 0117 08/30/14 0559   08/22/14 2245  valACYclovir (VALTREX) tablet 500 mg  Status:  Discontinued     500 mg Oral 2 times daily 08/22/14 2230 08/23/14 0127        DVT Prophylaxis: Eliquis  Code Status: Full code   Family Communication None  Procedures: None  CONSULTS:  cardiology  Time spent 40 minutes-Greater than 50% of this time was spent in counseling, explanation of diagnosis, planning of further management, and coordination of care.  MEDICATIONS: Scheduled Meds: . amLODipine  5 mg Oral BID  . apixaban  5 mg Oral BID  . atenolol  25 mg Oral Daily  . atenolol  50 mg Oral QHS  . atorvastatin  20 mg Oral q1800  . furosemide  40 mg Intravenous Daily  . lisinopril  40 mg Oral Daily  . magnesium oxide  400 mg Oral Daily  . multivitamin with minerals  1 tablet Oral Daily  . omega-3 acid ethyl esters  2 g Oral BID  . pantoprazole  40 mg Oral Daily  . sodium chloride  3 mL Intravenous Q12H  . valACYclovir  1,000 mg Oral Q8H   Continuous Infusions:  PRN Meds:.acetaminophen **OR** acetaminophen, ondansetron **OR** ondansetron (ZOFRAN) IV, traMADol    PHYSICAL EXAM: Vital signs in last 24 hours: Filed Vitals:   08/22/14 2019 08/22/14 2330 08/23/14 0520  BP: 123/82 139/83 126/65  Pulse: 124 96 80  Temp: 98.1 F (36.7 C) 97.7 F (36.5 C) 98.1 F (36.7 C)  TempSrc: Oral Oral Oral  Resp:  20 20  Height: 6' (1.829 m) 6' (1.829 m)   Weight: 95.255 kg (210 lb) 97.75 kg (215 lb 8 oz)   SpO2: 99% 96% 95%    Weight change:  [employer]  08/22/14 2019 08/22/14 2330  Weight: 95.255 kg (210 lb) 97.75 kg (215 lb 8 oz)   Body mass index is 29.22 kg/(m^2).   Gen Exam: Awake and alert with clear speech. Neck: Supple, No JVD.   Chest: B/L Clear.   CVS: S1 S2  Irregular,+syst murmur.  Abdomen: soft, BS +, non tender, non distended.  Extremities: no edema, lower extremities warm to touch. Neurologic: Non Focal.   Skin: No Rash.   Wounds: N/A.   Intake/Output from previous day:  Intake/Output Summary (Last 24 hours) at 08/23/14 1251 Last data filed at 08/23/14 1201  Gross per 24 hour  Intake    240 ml  Output    1425 ml  Net  -1185 ml     LAB RESULTS: CBC  Recent Labs Lab 08/22/14 2044 08/22/14 2102 08/23/14 0235  WBC 12.5*  --  10.4  HGB 13.6 15.6 13.7  HCT 41.2 46.0 41.2  PLT 218  --  188  MCV 88.2  --  87.7  MCH 29.1  --  29.1  MCHC 33.0  --  33.3  RDW 14.1  --  14.1  LYMPHSABS 0.9  --  1.6  MONOABS 1.4*  --  1.8*  EOSABS 0.0  --  0.0  BASOSABS 0.0  --  0.0    Chemistries   Recent Labs Lab 08/22/14 2044 08/22/14 2102 08/23/14 0235  NA 133* 133* 136  K 5.5* 6.6* 4.2  CL 101 102 101  CO2 21*  --  22  GLUCOSE 171* 169* 139*  BUN 29* 45* 28*  CREATININE 1.80* 1.80* 1.75*  CALCIUM 8.7*  --  8.8*  MG  --   --  2.2    CBG: No results for input(s): GLUCAP in the last 168 hours.  GFR Estimated Creatinine Clearance: 41.5 mL/min (by C-G formula based on Cr of 1.75).  Coagulation profile No results for input(s): INR, PROTIME in the last 168 hours.  Cardiac Enzymes  Recent Labs Lab 08/23/14 0235 08/23/14 0725  TROPONINI 0.03 0.03    Invalid input(s): POCBNP No results for input(s): DDIMER in the last 72 hours. No results for input(s): HGBA1C in the last 72 hours. No results for input(s): CHOL, HDL, LDLCALC, TRIG, CHOLHDL, LDLDIRECT in the last 72 hours.  Recent Labs  08/23/14 0235  TSH 1.055   No results for input(s): VITAMINB12, FOLATE, FERRITIN, TIBC, IRON, RETICCTPCT in the last 72 hours. No results for input(s): LIPASE, AMYLASE in the last 72 hours.  Urine Studies No results for input(s): UHGB, CRYS in the last 72 hours.  Invalid input(s): UACOL, UAPR, USPG, UPH, UTP, UGL, UKET, UBIL, UNIT, UROB, ULEU, UEPI, UWBC, URBC, UBAC, CAST, UCOM, BILUA  MICROBIOLOGY: No results found for this or any previous visit (from the past 240 hour(s)).  RADIOLOGY STUDIES/RESULTS: Dg Chest Port 1 View  08/22/2014   CLINICAL DATA:  Sudden onset of shortness of breath and chest pain since this afternoon. Pain radiates to the right side and shoulder.  EXAM: PORTABLE  CHEST - 1 VIEW  COMPARISON:  05/25/2008  FINDINGS: Patient is post CABG with intact sternal wires and multiple mediastinal clips. The heart is enlarged. Linear subpleural opacities in the right greater than left lower lungs, may reflect atelectasis versus minimal pulmonary edema. There is no vascular congestion. Mild hyperinflation, suspect underlying emphysema. No confluent airspace disease or pneumothorax. No large pleural effusion. No acute osseous abnormalities are seen.  IMPRESSION: 1. Cardiomegaly. Linear subpleural opacities may reflect minimal pulmonary edema versus  atelectasis. 2. Mild hyperinflation.   Electronically Signed   By: Jeb Levering M.D.   On: 08/22/2014 21:14    Oren Binet, MD  [facility] Pager:[phone]  If 7PM-7AM, please contact night-coverage [email or web address] Password [email or web address] 08/23/2014, 12:51 PM   LOS: 1 day

## 2014-08-23 NOTE — Progress Notes (Signed)
  Echocardiogram 2D Echocardiogram has been performed.  Haydyn Liddell FRANCES 08/23/2014, 3:19 PM

## 2014-08-23 NOTE — Consult Note (Signed)
CARDIOLOGY CONSULT NOTE   Patient ID: [name] MRN: [id number] DOB/AGE: [birth year] 81 y.o.  Admit date: 08/22/2014  Primary Physician   Pcp Not In System Primary Cardiologist   Dr. [name] Reason for Consultation   CHF and afib.   HPI: [name] is a 81 y.o. male with a history of HTN, PAF on apixaban, chronic diastolic CHF (EF 09%), CAD s/p CABG, AS s/p bioprosthetic AVR, CKD stage III and HTN who presented to [facility] ED on 08/22/14 with increasing shortness of breath.  He underwent 2 vessel CABG with LIMA to LAD, SVG to D1, SVG to RCA and AVR with pericardial tissue valve in 2009. He was last seen by Dr. Radford Pax in 02/2013 after a reoccurrence of atrial fibrillation and DCCV. He has been on apixiban and atenolol since that time.  He also was lasix 73m qd for chronic diastolic CHF at that time. He was lost to follow up for unclear reasons.    He states that someone decreased his Lasix recently to 10 mg daily however he doesn't know when or why and appeared confused about this. Regardless, he is been taking 10 mg of Lasix for the past 2 or 3 months. He was in his usual state of health but over the last couple weeks had felt generally not well and more fatigued. Yesterday he did some yard work and started to feel very short of breath. He went inside for a nap and woke up feeling "terrible" and called EMS. He admitted to lower extremity edema, orthopnea and PND. Emergency department he was noted to be in atrial fibrillation with RVR and acute on chronic CHF. Chest x-ray had pulmonary edema and his BNP was mildly elevated. He has had no chest pain and his troponins have remained normal. Additionally his potassium was noted to be 6.6. This has resolved after IV Lasix. The patient does not feel his atrial fibrillation and denies palpitations or fluttering. He is feeling better after initiation of IV diuresis. Currently his rate is controlled in the 90s after IV Cardizem. He is now  rate controlled on his home medication of atenolol 25 mg. Of note a right-sided rash that was characteristic of shingles was noted on his right chest and he has been placed Valcyclovir and airborne precautions.    Past Medical History  Diagnosis Date  . Heart murmur   . Hypercholesteremia   . HTN (hypertension)   . CHF (congestive heart failure)     2D echo 12/2010 - nl LVF, mild LAE, MAC with moderately thickened MV leaflets and trivial MR, normal functioning prosthetic AVR with mild increased gradient, grade II diastolic dysfunction   . Hypotension   . Orthostatic hypotension   . CKD (chronic kidney disease)   . Aortic valve disorder 2009    severe AS s/p AVR with pericardial tissue valve  . Diabetes   . Aortic stenosis   . Barrett esophagus   . CAD (coronary artery disease)     s/p 2 vessel CABG with LIMA to LAD, SVG to diagonal 1, SVG to RCA 12/09  . Cardiomyopathy     EF now normal by echo 2012 at 51%  . H/O: rheumatic fever   . Atrial fibrillation     post op afib with no reoccurence  . Diastolic dysfunction      Past Surgical History  Procedure Laterality Date  . Coronary artery bypass graft      x 3 Dr. HRoxan Hockey .  Avr with pericardial tissue valve    . Intra-ocular lens os dr Anders Simmonds    . Cardioversion N/A 01/27/2013    Procedure: CARDIOVERSION;  Surgeon: Sueanne Margarita, MD;  Location: [facility] ENDOSCOPY;  Service: Cardiovascular;  Laterality: N/A;    Allergies  Allergen Reactions  . Novocain [Procaine] Swelling    I have reviewed the patient's current medications . amLODipine  5 mg Oral BID  . apixaban  5 mg Oral BID  . atenolol  25 mg Oral Daily  . atenolol  50 mg Oral QHS  . atorvastatin  20 mg Oral q1800  . furosemide  40 mg Intravenous Daily  . lisinopril  40 mg Oral Daily  . magnesium oxide  400 mg Oral Daily  . multivitamin with minerals  1 tablet Oral Daily  . omega-3 acid ethyl esters  2 g Oral BID  . pantoprazole  40 mg Oral Daily  . sodium  chloride  3 mL Intravenous Q12H  . valACYclovir  1,000 mg Oral Q8H     acetaminophen **OR** acetaminophen, ondansetron **OR** ondansetron (ZOFRAN) IV, traMADol  Prior to Admission medications   Medication Sig Start Date End Date Taking? Authorizing Provider  amLODipine (NORVASC) 10 MG tablet Take 5 mg by mouth 2 (two) times daily.    Yes Historical Provider, MD  apixaban (ELIQUIS) 5 MG TABS tablet Take 1 tablet (5 mg total) by mouth 2 (two) times daily. 02/19/13  Yes Sueanne Margarita, MD  atenolol (TENORMIN) 25 MG tablet 1 tab in the am and 2 tabs in the pm to total 75 MG daily. 02/16/13  Yes Sueanne Margarita, MD  atorvastatin (LIPITOR) 20 MG tablet Take 20 mg by mouth daily.   Yes Historical Provider, MD  furosemide (LASIX) 20 MG tablet Take 10 mg by mouth daily. 01/26/13  Yes Sueanne Margarita, MD  lisinopril (PRINIVIL,ZESTRIL) 40 MG tablet Take 40 mg by mouth daily.   Yes Historical Provider, MD  Magnesium 250 MG TABS Take 1 tablet by mouth daily.    Yes Historical Provider, MD  Multiple Vitamin (MULTIVITAMIN) capsule Take 1 capsule by mouth daily.   Yes Historical Provider, MD  omega-3 acid ethyl esters (LOVAZA) 1 G capsule Take 2 g by mouth 2 (two) times daily.   Yes Historical Provider, MD  omeprazole (PRILOSEC) 20 MG capsule Take 20 mg by mouth daily.   Yes Historical Provider, MD  Potassium Gluconate 595 MG CAPS Take 1 capsule by mouth daily.   Yes Historical Provider, MD  traMADol (ULTRAM) 50 MG tablet Take 50 mg by mouth every 6 (six) hours as needed for moderate pain or severe pain (pain).   Yes Historical Provider, MD     History   Social History  . Marital Status: Married    Spouse Name: N/A  . Number of Children: N/A  . Years of Education: N/A   Occupational History  . Not on file.   Social History Main Topics  . Smoking status: Never Smoker   . Smokeless tobacco: Not on file  . Alcohol Use: No  . Drug Use: No  . Sexual Activity: No   Other Topics Concern  . Not on  file   Social History Narrative    Family Status  Relation Status Death Age  . Mother Deceased   . Father Deceased    Family History  Problem Relation Age of Onset  . Alzheimer's disease    . Alzheimer's disease Mother      ROS:  Full 14 point review of systems complete and found to be negative unless listed above.  Physical Exam: Blood pressure 126/65, pulse 80, temperature 98.1 F (36.7 C), temperature source Oral, resp. rate 20, height 6' (1.829 m), weight 215 lb 8 oz (97.75 kg), SpO2 95 %.  General: Well developed, well nourished, male in no acute distress Head: Eyes PERRLA, No xanthomas.   Normocephalic and atraumatic, oropharynx without edema or exudate.   Lungs: decreased breath sounds at bases Heart: irreg irreg .   Neck: No carotid bruits. No lymphadenopathy.  Mild JVD. Abdomen: Bowel sounds present, abdomen soft and non-tender without masses or hernias noted. Msk:  No spine or cva tenderness. No weakness, no joint deformities or effusions. Extremities: No clubbing or cyanosis.  Trace bilat edema.  Neuro: Alert and oriented X 3. No focal deficits noted. Psych:  Good affect, responds appropriately Skin: No rashes or lesions noted.  Labs:   Lab Results  Component Value Date   WBC 10.4 08/23/2014   HGB 13.7 08/23/2014   HCT 41.2 08/23/2014   MCV 87.7 08/23/2014   PLT 188 08/23/2014   No results for input(s): INR in the last 72 hours.  Recent Labs Lab 08/22/14 2044  08/23/14 0235  NA 133*  < > 136  K 5.5*  < > 4.2  CL 101  < > 101  CO2 21*  --  22  BUN 29*  < > 28*  CREATININE 1.80*  < > 1.75*  CALCIUM 8.7*  --  8.8*  PROT 7.1  --   --   BILITOT 1.5*  --   --   ALKPHOS 44  --   --   ALT 22  --   --   AST 39  --   --   GLUCOSE 171*  < > 139*  ALBUMIN 4.1  --   --   < > = values in this interval not displayed. MAGNESIUM  Date Value Ref Range Status  08/23/2014 2.2 1.7 - 2.4 mg/dL Final    Recent Labs  08/23/14 0235 08/23/14 0725  TROPONINI  0.03 0.03    Recent Labs  08/22/14 2046  TROPIPOC 0.01   No results found for: PROBNP Lab Results  Component Value Date   CHOL 93* 06/16/2014   HDL 34* 06/16/2014   LDLCALC 34 06/16/2014   TRIG 124 06/16/2014    AMYLASE  Date/Time Value Ref Range Status  03/13/2008 04:30 AM 47 27 - 131 U/L Final   TSH  Date/Time Value Ref Range Status  08/23/2014 02:35 AM 1.055 0.350 - 4.500 uIU/mL Final     Echo: pending  ECG:  HR 123 Atrial fibrillation Nonspecific IVCD with LAD LVH with secondary repolarization abnormality,old LBBB   Radiology:  Dg Chest Port 1 View  08/22/2014   CLINICAL DATA:  Sudden onset of shortness of breath and chest pain since this afternoon. Pain radiates to the right side and shoulder.  EXAM: PORTABLE CHEST - 1 VIEW  COMPARISON:  05/25/2008  FINDINGS: Patient is post CABG with intact sternal wires and multiple mediastinal clips. The heart is enlarged. Linear subpleural opacities in the right greater than left lower lungs, may reflect atelectasis versus minimal pulmonary edema. There is no vascular congestion. Mild hyperinflation, suspect underlying emphysema. No confluent airspace disease or pneumothorax. No large pleural effusion. No acute osseous abnormalities are seen.  IMPRESSION: 1. Cardiomegaly. Linear subpleural opacities may reflect minimal pulmonary edema versus atelectasis. 2. Mild hyperinflation.   Electronically Signed  By: Jeb Levering M.D.   On: 08/22/2014 21:14    ASSESSMENT AND PLAN:    Principal Problem:   Diastolic CHF Active Problems:   HTN (hypertension)   Shingles   Atrial fibrillation with RVR   CKD (chronic kidney disease) stage 3, GFR 30-59 ml/min   Hyperkalemia   CHF (congestive heart failure)    [name] is a 81 y.o. male with a history of HTN, PAF on apixaban, chronic diastolic CHF (EF 81%), CAD s/p CABG, AS s/p bioprosthetic AVR, CKD stage III and HTN who presented to [facility] ED on 08/22/14 with increasing shortness of  breath.  Acute diastolic CHF probably precipitated by A. fib with RVR and decreased lasix dosing. CXR with pulmonary edema and BNP 592. -- Agree with repeat 2D ECHO.  -- Given 63m IV Lasix with net neg 7242m Weight appears to be up? Continue to monitor. He is feeling better but needs additional diuresis. Continue 4075mV Lasix IV qd.   A. fib with RVR -  -- CHADSVASC score of at least 5 (HTN 1, CHF 1 and CAD 1 and age 63).28He is appropriately anticoagulated on apixaban. -- TSH normal -- Repeat 2D ECHO pending -- Currently rate controlled on atenolol 32m43m. HR in 90s. He could be cardioverted without TEE if need be as he has been complaint with AC.   Shingles - patient has rash on the right side of the chest. Characteristic of shingles -- Cont valacyclovir per IM. On contact precautions  CKD- difficult to know baseline- likely ~ 1.6. Creat a little improved from yesterday 1.8--> 1.75. Continue to monitor  Hyperkalemia - resolved with IV diuresis. 6.6--> 4.2 today. Continue to monitor  CAD s/p CABG - No chest pain. Troponin neg x2.. -- Continue statin and BB. No ASA due to chronic AC with apixaban.   History of prosthetic aortic valve replacement - 2-D ECHO pending.  Hyperglycemia - hemoglobin A1c pending.   HTN -well controlled on amlodipine 5mg 62m and atenolol 32mg 49m   Signed: THOMEileen Stanford 08/23/2014 9:21 AM  Pager [phone] MD

## 2014-08-23 NOTE — Progress Notes (Signed)
Call placed to patient's spouse this am explaining patient's room change and need for isolation. Spouse appreciative for the call, no further questions or concerns at this time.

## 2014-08-23 NOTE — Progress Notes (Signed)
Patient brought to the floor by ED RN. Order for contact precautions in the computer. Patient found to have an active case of the shingles. CN and AC informed. MD on the floor to see patient. Patient moved to isolation room 1439 once room cleaned.

## 2014-08-24 DIAGNOSIS — I5042 Chronic combined systolic (congestive) and diastolic (congestive) heart failure: Secondary | ICD-10-CM | POA: Diagnosis present

## 2014-08-24 DIAGNOSIS — E875 Hyperkalemia: Secondary | ICD-10-CM

## 2014-08-24 DIAGNOSIS — I5021 Acute systolic (congestive) heart failure: Secondary | ICD-10-CM

## 2014-08-24 LAB — BASIC METABOLIC PANEL
Anion gap: 11 (ref 5–15)
BUN: 32 mg/dL — AB (ref 6–20)
CHLORIDE: 99 mmol/L — AB (ref 101–111)
CO2: 24 mmol/L (ref 22–32)
Calcium: 8.4 mg/dL — ABNORMAL LOW (ref 8.9–10.3)
Creatinine, Ser: 1.73 mg/dL — ABNORMAL HIGH (ref 0.61–1.24)
GFR calc Af Amer: 41 mL/min — ABNORMAL LOW (ref >60)
GFR calc non Af Amer: 36 mL/min — ABNORMAL LOW (ref >60)
GLUCOSE: 126 mg/dL — AB (ref 65–99)
POTASSIUM: 3.5 mmol/L (ref 3.5–5.1)
SODIUM: 134 mmol/L — AB (ref 135–145)

## 2014-08-24 MED ORDER — AMIODARONE HCL 200 MG PO TABS
400.0000 mg | ORAL_TABLET | Freq: Two times a day (BID) | ORAL | Status: DC
  Administered 2014-08-24 – 2014-08-26 (×5): 400 mg via ORAL
  Filled 2014-08-24 (×5): qty 2

## 2014-08-24 MED ORDER — POTASSIUM CHLORIDE CRYS ER 20 MEQ PO TBCR
40.0000 meq | EXTENDED_RELEASE_TABLET | Freq: Once | ORAL | Status: AC
  Administered 2014-08-24: 40 meq via ORAL
  Filled 2014-08-24: qty 2

## 2014-08-24 MED ORDER — POTASSIUM CHLORIDE CRYS ER 20 MEQ PO TBCR
20.0000 meq | EXTENDED_RELEASE_TABLET | Freq: Every day | ORAL | Status: DC
  Administered 2014-08-25 – 2014-08-26 (×2): 20 meq via ORAL
  Filled 2014-08-24 (×2): qty 1

## 2014-08-24 MED ORDER — FUROSEMIDE 40 MG PO TABS
40.0000 mg | ORAL_TABLET | Freq: Every day | ORAL | Status: DC
  Administered 2014-08-24 – 2014-08-26 (×3): 40 mg via ORAL
  Filled 2014-08-24 (×3): qty 1

## 2014-08-24 NOTE — Progress Notes (Signed)
PATIENT DETAILS Name: [name] Age: 81 y.o. Sex: male Date of Birth: [birth year] Admit Date: 08/22/2014 Admitting Physician Rise Patience, MD PCP:Pcp Not In System  Subjective: Much improved-denies any shortness of breath  Assessment/Plan: Principal Problem:   Acute systolic CHF: Precipitated by A. fib with RVR. Much more compensated with control in heart rate and with diuresis. Initially treated with IV Lasix, now on oral Lasix. Echocardiogram shows new onset of systolic dysfunction-cardiology plans outpatient ischemic evaluation. Continue with atenolol, given severe hyperkalemia on presentation-continue to hold lisinopril.  Active Problems:   Atrial fibrillation with RVR: Required IV Cardizem on admission, currently rate controlled with atenolol. Already anticoagulated with Eliquis. Continue to remain in atrial fibrillation, cardiology was consulted-cardioversion planned for 5/18.     NSVT: Keep potassium more than 4 and magnesium 1 and 2. Await echocardiogram    HTN (hypertension): controlled, continue atenolol, amlodipine. Discontinued Lisinopril given hyperkalemia on presentation.    Chronic kidney disease stage III: Creatinine close to usual baseline. Monitor closely while on IV diuretics.    Hyperkalemia: Resolved following Kayexalate.  Will d/c lisinopril.Follow electrolytes.    History of CAD: Status post CABG. Troponins negative. No chest pain. Suspect not on aspirin-as on Eliquis. Continue beta blocker and statin.    History of aortic valve replacement with bioprosthetic valve: Echo showed normally functioning bioprosthetic valve.    GERD: Continue PPI    Herpes zoster: Continue with Valtrex.   Disposition: Remain inpatient-Home tomorrow post cardioversion  Antimicrobial agents  See below  Anti-infectives    Start     Dose/Rate Route Frequency Ordered Stop   08/23/14 0600  valACYclovir (VALTREX) tablet 1,000 mg     1,000 mg Oral  Every 8 hours 08/23/14 0117 08/30/14 0559   08/22/14 2245  valACYclovir (VALTREX) tablet 500 mg  Status:  Discontinued     500 mg Oral 2 times daily 08/22/14 2230 08/23/14 0127      DVT Prophylaxis: Eliquis  Code Status: Full code   Family Communication Son at bedside  Procedures: None  CONSULTS:  cardiology  Time spent 30 minutes-Greater than 50% of this time was spent in counseling, explanation of diagnosis, planning of further management, and coordination of care.  MEDICATIONS: Scheduled Meds: . amLODipine  5 mg Oral BID  . apixaban  5 mg Oral BID  . atenolol  25 mg Oral Daily  . atenolol  50 mg Oral QHS  . atorvastatin  20 mg Oral q1800  . furosemide  40 mg Oral Daily  . magnesium oxide  400 mg Oral Daily  . multivitamin with minerals  1 tablet Oral Daily  . omega-3 acid ethyl esters  2 g Oral BID  . pantoprazole  40 mg Oral Daily  . sodium chloride  3 mL Intravenous Q12H  . valACYclovir  1,000 mg Oral Q8H   Continuous Infusions:  PRN Meds:.acetaminophen **OR** acetaminophen, ondansetron **OR** ondansetron (ZOFRAN) IV, traMADol    PHYSICAL EXAM: Vital signs in last 24 hours: Filed Vitals:   08/23/14 0520 08/23/14 1408 08/23/14 2145 08/24/14 0657  BP: 126/65 131/68 110/71 125/60  Pulse: 80 73 83 85  Temp: 98.1 F (36.7 C) 98.2 F (36.8 C) 98.4 F (36.9 C) 97.7 F (36.5 C)  TempSrc: Oral Oral Oral Oral  Resp: _0 Height:      Weight:    94.9 kg (209 lb 3.5 oz)  SpO2:  95% 95% 94% 98%    Weight change: -0.356 kg (-12.5 oz) Filed Weights   08/22/14 2019 08/22/14 2330 08/24/14 0657  Weight: 95.255 kg (210 lb) 97.75 kg (215 lb 8 oz) 94.9 kg (209 lb 3.5 oz)   Body mass index is 28.37 kg/(m^2).   Gen Exam: Awake and alert with clear speech. Neck: Supple, No JVD.   Chest: B/L Clear.   CVS: S1 S2  Irregular,+syst murmur.  Abdomen: soft, BS +, non tender, non distended.  Extremities: no edema, lower extremities warm to touch. Neurologic: Non  Focal.   Skin: Zoster rash across his right chest into his right back   Wounds: N/A.   Intake/Output from previous day:  Intake/Output Summary (Last 24 hours) at 08/24/14 1210 Last data filed at 08/24/14 1059  Gross per 24 hour  Intake    740 ml  Output    800 ml  Net    -60 ml     LAB RESULTS: CBC  Recent Labs Lab 08/22/14 2044 08/22/14 2102 08/23/14 0235  WBC 12.5*  --  10.4  HGB 13.6 15.6 13.7  HCT 41.2 46.0 41.2  PLT 218  --  188  MCV 88.2  --  87.7  MCH 29.1  --  29.1  MCHC 33.0  --  33.3  RDW 14.1  --  14.1  LYMPHSABS 0.9  --  1.6  MONOABS 1.4*  --  1.8*  EOSABS 0.0  --  0.0  BASOSABS 0.0  --  0.0    Chemistries   Recent Labs Lab 08/22/14 2044 08/22/14 2102 08/23/14 0235 08/24/14 0420  NA 133* 133* 136 134*  K 5.5* 6.6* 4.2 3.5  CL 101 102 101 99*  CO2 21*  --  22 24  GLUCOSE 171* 169* 139* 126*  BUN 29* 45* 28* 32*  CREATININE 1.80* 1.80* 1.75* 1.73*  CALCIUM 8.7*  --  8.8* 8.4*  MG  --   --  2.2  --     CBG: No results for input(s): GLUCAP in the last 168 hours.  GFR Estimated Creatinine Clearance: 41.4 mL/min (by C-G formula based on Cr of 1.73).  Coagulation profile No results for input(s): INR, PROTIME in the last 168 hours.  Cardiac Enzymes  Recent Labs Lab 08/23/14 0235 08/23/14 0725 08/23/14 1325  TROPONINI 0.03 0.03 0.03    Invalid input(s): POCBNP No results for input(s): DDIMER in the last 72 hours. No results for input(s): HGBA1C in the last 72 hours. No results for input(s): CHOL, HDL, LDLCALC, TRIG, CHOLHDL, LDLDIRECT in the last 72 hours.  Recent Labs  08/23/14 0235  TSH 1.055   No results for input(s): VITAMINB12, FOLATE, FERRITIN, TIBC, IRON, RETICCTPCT in the last 72 hours. No results for input(s): LIPASE, AMYLASE in the last 72 hours.  Urine Studies No results for input(s): UHGB, CRYS in the last 72 hours.  Invalid input(s): UACOL, UAPR, USPG, UPH, UTP, UGL, UKET, UBIL, UNIT, UROB, ULEU, UEPI, UWBC,  URBC, UBAC, CAST, UCOM, BILUA  MICROBIOLOGY: No results found for this or any previous visit (from the past 240 hour(s)).  RADIOLOGY STUDIES/RESULTS: Dg Chest Port 1 View  08/22/2014   CLINICAL DATA:  Sudden onset of shortness of breath and chest pain since this afternoon. Pain radiates to the right side and shoulder.  EXAM: PORTABLE CHEST - 1 VIEW  COMPARISON:  05/25/2008  FINDINGS: Patient is post CABG with intact sternal wires and multiple mediastinal clips. The heart is enlarged. Linear subpleural opacities in the  right greater than left lower lungs, may reflect atelectasis versus minimal pulmonary edema. There is no vascular congestion. Mild hyperinflation, suspect underlying emphysema. No confluent airspace disease or pneumothorax. No large pleural effusion. No acute osseous abnormalities are seen.  IMPRESSION: 1. Cardiomegaly. Linear subpleural opacities may reflect minimal pulmonary edema versus atelectasis. 2. Mild hyperinflation.   Electronically Signed   By: Jeb Levering M.D.   On: 08/22/2014 21:14    Oren Binet, MD  [facility] Pager:[phone]  If 7PM-7AM, please contact night-coverage [email or web address] Password [email or web address] 08/24/2014, 12:10 PM   LOS: 2 days

## 2014-08-24 NOTE — Progress Notes (Signed)
Patient Name: [name] Date of Encounter: 08/24/2014     Principal Problem:   Diastolic CHF Active Problems:   HTN (hypertension)   Shingles   Atrial fibrillation with RVR   CKD (chronic kidney disease) stage 3, GFR 30-59 ml/min   Hyperkalemia   CHF (congestive heart failure)    SUBJECTIVE  He is feeling MUCH better today. He says he is breathing at his baseline and feeling quite well. Able to lie flat with no difficulties. Understanding that we could not get his DCCV scheduled today. He is willing to wait until tomorrow. He assures me he has been very complaint with his eliquis as an outpatient.  CURRENT MEDS . amLODipine  5 mg Oral BID  . apixaban  5 mg Oral BID  . atenolol  25 mg Oral Daily  . atenolol  50 mg Oral QHS  . atorvastatin  20 mg Oral q1800  . furosemide  40 mg Intravenous Daily  . magnesium oxide  400 mg Oral Daily  . multivitamin with minerals  1 tablet Oral Daily  . omega-3 acid ethyl esters  2 g Oral BID  . pantoprazole  40 mg Oral Daily  . sodium chloride  3 mL Intravenous Q12H  . valACYclovir  1,000 mg Oral Q8H    OBJECTIVE  Filed Vitals:   08/23/14 0520 08/23/14 1408 08/23/14 2145 08/24/14 0657  BP: 126/65 131/68 110/71 125/60  Pulse: 80 73 83 85  Temp: 98.1 F (36.7 C) 98.2 F (36.8 C) 98.4 F (36.9 C) 97.7 F (36.5 C)  TempSrc: Oral Oral Oral Oral  Resp: _0 Height:      Weight:    209 lb 3.5 oz (94.9 kg)  SpO2: 95% 95% 94% 98%    Intake/Output Summary (Last 24 hours) at 08/24/14 0752 Last data filed at 08/24/14 0600  Gross per 24 hour  Intake    740 ml  Output   1300 ml  Net   -560 ml   Filed Weights   08/22/14 2019 08/22/14 2330 08/24/14 0657  Weight: 210 lb (95.255 kg) 215 lb 8 oz (97.75 kg) 209 lb 3.5 oz (94.9 kg)    PHYSICAL EXAM  General: Pleasant, NAD. Neuro: Alert and oriented X 3. Moves all extremities spontaneously. Psych: Normal affect. HEENT:  Normal  Neck: Supple without bruits or  JVD. Lungs:  Resp regular and unlabored, CTA. Heart: irreg irreg. no s3, s4, or murmurs. Abdomen: Soft, non-tender, non-distended, BS + x 4.  Extremities: No clubbing, cyanosis or edema. DP/PT/Radials 2+ and equal bilaterally.  Accessory Clinical Findings  CBC  Recent Labs  08/22/14 2044 08/22/14 2102 08/23/14 0235  WBC 12.5*  --  10.4  NEUTROABS 10.3*  --  6.9  HGB 13.6 15.6 13.7  HCT 41.2 46.0 41.2  MCV 88.2  --  87.7  PLT 218  --  128   Basic Metabolic Panel  Recent Labs  08/23/14 0235 08/24/14 0420  NA 136 134*  K 4.2 3.5  CL 101 99*  CO2 22 24  GLUCOSE 139* 126*  BUN 28* 32*  CREATININE 1.75* 1.73*  CALCIUM 8.8* 8.4*  MG 2.2  --    Liver Function Tests  Recent Labs  08/22/14 2044  AST 39  ALT 22  ALKPHOS 44  BILITOT 1.5*  PROT 7.1  ALBUMIN 4.1   No results for input(s): LIPASE, AMYLASE in the last 72 hours. Cardiac Enzymes  Recent Labs  08/23/14 0235 08/23/14 0725 08/23/14  1325  TROPONINI 0.03 0.03 0.03    Thyroid Function Tests  Recent Labs  08/23/14 0235  TSH 1.055    TELE  afib with CVR  Radiology/Studies  Dg Chest Port 1 View  08/22/2014   CLINICAL DATA:  Sudden onset of shortness of breath and chest pain since this afternoon. Pain radiates to the right side and shoulder.  EXAM: PORTABLE CHEST - 1 VIEW  COMPARISON:  05/25/2008  FINDINGS: Patient is post CABG with intact sternal wires and multiple mediastinal clips. The heart is enlarged. Linear subpleural opacities in the right greater than left lower lungs, may reflect atelectasis versus minimal pulmonary edema. There is no vascular congestion. Mild hyperinflation, suspect underlying emphysema. No confluent airspace disease or pneumothorax. No large pleural effusion. No acute osseous abnormalities are seen.  IMPRESSION: 1. Cardiomegaly. Linear subpleural opacities may reflect minimal pulmonary edema versus atelectasis. 2. Mild hyperinflation.   Electronically Signed   By: Jeb Levering M.D.   On: 08/22/2014 21:14    2D ECHO Study Date: 08/23/2014 LV EF: 30% -   35% Study Conclusions - Left ventricle: The cavity size was mildly dilated. Wall   thickness was increased in a pattern of mild LVH. Systolic   function was moderately to severely reduced. The estimated   ejection fraction was in the range of 30% to 35%. Diffuse   hypokinesis. - Ventricular septum: Septal motion showed paradox. These changes   are consistent with intraventricular conduction delay. - Aortic valve: A bioprosthesis was present and functioning   normally. Valve area (VTI): 1.1 cm^2. Valve area (Vmax): 1.1   cm^2. Valve area (Vmean): 0.99 cm^2. - Mitral valve: There was mild regurgitation. Valve area by   continuity equation (using LVOT flow): 1.07 cm^2. - Left atrium: The atrium was severely dilated. - Right ventricle: Systolic function was reduced. - Right atrium: The atrium was moderately dilated.   ASSESSMENT AND PLAN  [name] is a 81 y.o. male with a history of HTN, PAF on apixaban, chronic diastolic CHF (EF 16%), CAD s/p CABG, AS s/p bioprosthetic AVR, CKD stage III and HTN who presented to [facility] ED on 08/22/14 with increasing shortness of breath.  Acute mixed systolic and diastolic CHF probably precipitated by A. fib with RVR and decreased lasix dosing. CXR with pulmonary edema and BNP 592. -- 2D ECHO with newly reduced EF 30-35% ( down from 51% in 2012) and diffuse HK. -- He has been on 65m IV Lasix IV qd. Net neg 1.3 L. Weight down 1 lb from admission. Appears euvolemic and breathing back to baseline. Will convert him to PO 419mqd today  A. fib with RVR - Currently rate controlled on atenolol 2546md.  -- CHADSVASC score of at least 5 (HTN 1, CHF 1 and CAD 1 and age 39).46He is appropriately anticoagulated on apixaban. He has not missed a dose of Eliquis over the past few months. We will proceed with DCCV without TEE. Due [facility] scheduling issues [facility] -- TSH normal -- Repeat 2D ECHO with newly reduced EF and diffuse HK- tachycardia mediated? Also severe LA dilaiton  CAD s/p CABG - No chest pain. Troponin neg x3. -- With newly reduced EF he may need an ischemic eval. However, this could be tachycardia mediated CM. We will first attempt to cardiovert him into NSR and then re-visit further ischemic work up.  -- Continue statin and BB. No ASA due to  chronic AC with apixaban.   Shingles - patient has rash on the right side of the chest. Characteristic of shingles -- Cont valacyclovir per IM. On contact precautions  CKD- difficult to know baseline- likely ~ 1.7. Creat a little improved from yesterday 1.8--> 1.75--> 1.73. He seems to be around his baseline. Continue to monitor  Hyperkalemia - resolved with IV diuresis. 6.6--> 3.5 today. Continue to monitor  History of prosthetic aortic valve replacement -  2D ECHO with normal functioning bioprosthesis valve.   HTN -well controlled on amlodipine 68m BID and atenolol 278mqd.    SiJudy PimpleA-C  Pager [phone]

## 2014-08-25 ENCOUNTER — Inpatient Hospital Stay (HOSPITAL_COMMUNITY): Payer: Commercial Managed Care - HMO | Admitting: Anesthesiology

## 2014-08-25 ENCOUNTER — Encounter (HOSPITAL_COMMUNITY)
Admission: EM | Disposition: A | Discharge status: Home / Self Care | Payer: Self-pay | Source: Home / Self Care | Attending: Internal Medicine

## 2014-08-25 ENCOUNTER — Encounter (HOSPITAL_COMMUNITY): Payer: Self-pay | Admitting: Anesthesiology

## 2014-08-25 DIAGNOSIS — I5033 Acute on chronic diastolic (congestive) heart failure: Secondary | ICD-10-CM

## 2014-08-25 DIAGNOSIS — I4891 Unspecified atrial fibrillation: Secondary | ICD-10-CM

## 2014-08-25 DIAGNOSIS — I5043 Acute on chronic combined systolic (congestive) and diastolic (congestive) heart failure: Secondary | ICD-10-CM

## 2014-08-25 DIAGNOSIS — B029 Zoster without complications: Secondary | ICD-10-CM

## 2014-08-25 HISTORY — PX: CARDIOVERSION: SHX1299

## 2014-08-25 LAB — BASIC METABOLIC PANEL
Anion gap: 6 (ref 5–15)
BUN: 36 mg/dL — ABNORMAL HIGH (ref 6–20)
CHLORIDE: 106 mmol/L (ref 101–111)
CO2: 22 mmol/L (ref 22–32)
CREATININE: 1.65 mg/dL — AB (ref 0.61–1.24)
Calcium: 8.4 mg/dL — ABNORMAL LOW (ref 8.9–10.3)
GFR calc Af Amer: 44 mL/min — ABNORMAL LOW (ref >60)
GFR calc non Af Amer: 38 mL/min — ABNORMAL LOW (ref >60)
Glucose, Bld: 129 mg/dL — ABNORMAL HIGH (ref 65–99)
Potassium: 4.1 mmol/L (ref 3.5–5.1)
Sodium: 134 mmol/L — ABNORMAL LOW (ref 135–145)

## 2014-08-25 SURGERY — CARDIOVERSION
Anesthesia: General

## 2014-08-25 MED ORDER — SODIUM CHLORIDE 0.9 % IV SOLN
INTRAVENOUS | Status: DC
  Administered 2014-08-25: 500 mL via INTRAVENOUS

## 2014-08-25 MED ORDER — PROPOFOL 10 MG/ML IV BOLUS
INTRAVENOUS | Status: DC | PRN
  Administered 2014-08-25: 80 mg via INTRAVENOUS

## 2014-08-25 MED ORDER — LIDOCAINE HCL (CARDIAC) 20 MG/ML IV SOLN
INTRAVENOUS | Status: DC | PRN
  Administered 2014-08-25: 50 mg via INTRAVENOUS

## 2014-08-25 MED ORDER — LACTATED RINGERS IV SOLN
INTRAVENOUS | Status: DC | PRN
  Administered 2014-08-25: 10:00:00 via INTRAVENOUS

## 2014-08-25 NOTE — Progress Notes (Signed)
Report called to RN at [facility] and report given to [facility]. [facility] here to transport over to [employer].

## 2014-08-25 NOTE — Anesthesia Preprocedure Evaluation (Addendum)
Anesthesia Evaluation  Patient identified by MRN, date of birth, ID band Patient awake    Reviewed: Allergy & Precautions, NPO status , Patient's Chart, lab work & pertinent test results  Airway Mallampati: II  TM Distance: >3 FB Neck ROM: full    Dental   Pulmonary neg pulmonary ROS,  breath sounds clear to auscultation  Pulmonary exam normal       Cardiovascular hypertension, Pt. on medications + CAD, + CABG and +CHF Normal cardiovascular exam+ Valvular Problems/Murmurs Rhythm:irregular Rate:Normal  S/p AVR.   Neuro/Psych    GI/Hepatic   Endo/Other  diabetes, Type 2  Renal/GU Renal InsufficiencyRenal disease     Musculoskeletal   Abdominal   Peds  Hematology   Anesthesia Other Findings   Reproductive/Obstetrics                            Anesthesia Physical Anesthesia Plan  ASA: III  Anesthesia Plan: General   Post-op Pain Management:    Induction: Intravenous  Airway Management Planned: Mask  Additional Equipment:   Intra-op Plan:   Post-operative Plan:   Informed Consent: I have reviewed the patients History and Physical, chart, labs and discussed the procedure including the risks, benefits and alternatives for the proposed anesthesia with the patient or authorized representative who has indicated his/her understanding and acceptance.     Plan Discussed with: CRNA and Surgeon  Anesthesia Plan Comments:        Anesthesia Quick Evaluation

## 2014-08-25 NOTE — Anesthesia Postprocedure Evaluation (Signed)
  Anesthesia Post-op Note  Patient: [name]  Procedure(s) Performed: Procedure(s): CARDIOVERSION (N/A)  Patient Location: Endoscopy Unit  Anesthesia Type:MAC  Level of Consciousness: awake, alert  and oriented  Airway and Oxygen Therapy: Patient Spontanous Breathing and Patient connected to nasal cannula oxygen  Post-op Pain: none  Post-op Assessment: Post-op Vital signs reviewed, Patient's Cardiovascular Status Stable, Respiratory Function Stable, Patent Airway, No signs of Nausea or vomiting and Pain level controlled  Post-op Vital Signs: Reviewed and stable  Last Vitals:  Filed Vitals:   08/25/14 1012  BP: 152/85  Pulse: 90  Temp: 36.7 C  Resp: 18    Complications: No apparent anesthesia complications

## 2014-08-25 NOTE — H&P (View-Only) (Signed)
CARDIOLOGY CONSULT NOTE   Patient ID: [name] MRN: [id number] DOB/AGE: 12/18/1932 81 y.o.  Admit date: 08/22/2014  Primary Physician   Pcp Not In System Primary Cardiologist   Dr. [name] Reason for Consultation   CHF and afib.   HPI: [name] is a 81 y.o. male with a history of HTN, PAF on apixaban, chronic diastolic CHF (EF 78%), CAD s/p CABG, AS s/p bioprosthetic AVR, CKD stage III and HTN who presented to [facility] ED on 08/22/14 with increasing shortness of breath.  He underwent 2 vessel CABG with LIMA to LAD, SVG to D1, SVG to RCA and AVR with pericardial tissue valve in 2009. He was last seen by Dr. Radford Pax in 02/2013 after a reoccurrence of atrial fibrillation and DCCV. He has been on apixiban and atenolol since that time.  He also was lasix 53m qd for chronic diastolic CHF at that time. He was lost to follow up for unclear reasons.    He states that someone decreased his Lasix recently to 10 mg daily however he doesn't know when or why and appeared confused about this. Regardless, he is been taking 10 mg of Lasix for the past 2 or 3 months. He was in his usual state of health but over the last couple weeks had felt generally not well and more fatigued. Yesterday he did some yard work and started to feel very short of breath. He went inside for a nap and woke up feeling "terrible" and called EMS. He admitted to lower extremity edema, orthopnea and PND. Emergency department he was noted to be in atrial fibrillation with RVR and acute on chronic CHF. Chest x-ray had pulmonary edema and his BNP was mildly elevated. He has had no chest pain and his troponins have remained normal. Additionally his potassium was noted to be 6.6. This has resolved after IV Lasix. The patient does not feel his atrial fibrillation and denies palpitations or fluttering. He is feeling better after initiation of IV diuresis. Currently his rate is controlled in the 90s after IV Cardizem. He is now  rate controlled on his home medication of atenolol 25 mg. Of note a right-sided rash that was characteristic of shingles was noted on his right chest and he has been placed Valcyclovir and airborne precautions.    Past Medical History  Diagnosis Date  . Heart murmur   . Hypercholesteremia   . HTN (hypertension)   . CHF (congestive heart failure)     2D echo 12/2010 - nl LVF, mild LAE, MAC with moderately thickened MV leaflets and trivial MR, normal functioning prosthetic AVR with mild increased gradient, grade II diastolic dysfunction   . Hypotension   . Orthostatic hypotension   . CKD (chronic kidney disease)   . Aortic valve disorder 2009    severe AS s/p AVR with pericardial tissue valve  . Diabetes   . Aortic stenosis   . Barrett esophagus   . CAD (coronary artery disease)     s/p 2 vessel CABG with LIMA to LAD, SVG to diagonal 1, SVG to RCA 12/09  . Cardiomyopathy     EF now normal by echo 2012 at 51%  . H/O: rheumatic fever   . Atrial fibrillation     post op afib with no reoccurence  . Diastolic dysfunction      Past Surgical History  Procedure Laterality Date  . Coronary artery bypass graft      x 3 Dr. HRoxan Hockey .  Avr with pericardial tissue valve    . Intra-ocular lens os dr hagaman    . Cardioversion N/A 01/27/2013    Procedure: CARDIOVERSION;  Surgeon: Traci R Turner, MD;  Location: [facility];  Service: Cardiovascular;  Laterality: N/A;    Allergies  Allergen Reactions  . Novocain [Procaine] Swelling    I have reviewed the patient's current medications . amLODipine  5 mg Oral BID  . apixaban  5 mg Oral BID  . atenolol  25 mg Oral Daily  . atenolol  50 mg Oral QHS  . atorvastatin  20 mg Oral q1800  . furosemide  40 mg Intravenous Daily  . lisinopril  40 mg Oral Daily  . magnesium oxide  400 mg Oral Daily  . multivitamin with minerals  1 tablet Oral Daily  . omega-3 acid ethyl esters  2 g Oral BID  . pantoprazole  40 mg Oral Daily  . sodium  chloride  3 mL Intravenous Q12H  . valACYclovir  1,000 mg Oral Q8H     acetaminophen **OR** acetaminophen, ondansetron **OR** ondansetron (ZOFRAN) IV, traMADol  Prior to Admission medications   Medication Sig Start Date End Date Taking? Authorizing Provider  amLODipine (NORVASC) 10 MG tablet Take 5 mg by mouth 2 (two) times daily.    Yes Historical Provider, MD  apixaban (ELIQUIS) 5 MG TABS tablet Take 1 tablet (5 mg total) by mouth 2 (two) times daily. 02/19/13  Yes Traci R Turner, MD  atenolol (TENORMIN) 25 MG tablet 1 tab in the am and 2 tabs in the pm to total 75 MG daily. 02/16/13  Yes Traci R Turner, MD  atorvastatin (LIPITOR) 20 MG tablet Take 20 mg by mouth daily.   Yes Historical Provider, MD  furosemide (LASIX) 20 MG tablet Take 10 mg by mouth daily. 01/26/13  Yes Traci R Turner, MD  lisinopril (PRINIVIL,ZESTRIL) 40 MG tablet Take 40 mg by mouth daily.   Yes Historical Provider, MD  Magnesium 250 MG TABS Take 1 tablet by mouth daily.    Yes Historical Provider, MD  Multiple Vitamin (MULTIVITAMIN) capsule Take 1 capsule by mouth daily.   Yes Historical Provider, MD  omega-3 acid ethyl esters (LOVAZA) 1 G capsule Take 2 g by mouth 2 (two) times daily.   Yes Historical Provider, MD  omeprazole (PRILOSEC) 20 MG capsule Take 20 mg by mouth daily.   Yes Historical Provider, MD  Potassium Gluconate 595 MG CAPS Take 1 capsule by mouth daily.   Yes Historical Provider, MD  traMADol (ULTRAM) 50 MG tablet Take 50 mg by mouth every 6 (six) hours as needed for moderate pain or severe pain (pain).   Yes Historical Provider, MD     History   Social History  . Marital Status: Married    Spouse Name: N/A  . Number of Children: N/A  . Years of Education: N/A   Occupational History  . Not on file.   Social History Main Topics  . Smoking status: Never Smoker   . Smokeless tobacco: Not on file  . Alcohol Use: No  . Drug Use: No  . Sexual Activity: No   Other Topics Concern  . Not on  file   Social History Narrative    Family Status  Relation Status Death Age  . Mother Deceased   . Father Deceased    Family History  Problem Relation Age of Onset  . Alzheimer's disease    . Alzheimer's disease Mother      ROS:    Full 14 point review of systems complete and found to be negative unless listed above.  Physical Exam: Blood pressure 126/65, pulse 80, temperature 98.1 F (36.7 C), temperature source Oral, resp. rate 20, height 6' (1.829 m), weight 215 lb 8 oz (97.75 kg), SpO2 95 %.  General: Well developed, well nourished, male in no acute distress Head: Eyes PERRLA, No xanthomas.   Normocephalic and atraumatic, oropharynx without edema or exudate.   Lungs: decreased breath sounds at bases Heart: irreg irreg .   Neck: No carotid bruits. No lymphadenopathy.  Mild JVD. Abdomen: Bowel sounds present, abdomen soft and non-tender without masses or hernias noted. Msk:  No spine or cva tenderness. No weakness, no joint deformities or effusions. Extremities: No clubbing or cyanosis.  Trace bilat edema.  Neuro: Alert and oriented X 3. No focal deficits noted. Psych:  Good affect, responds appropriately Skin: No rashes or lesions noted.  Labs:   Lab Results  Component Value Date   WBC 10.4 08/23/2014   HGB 13.7 08/23/2014   HCT 41.2 08/23/2014   MCV 87.7 08/23/2014   PLT 188 08/23/2014   No results for input(s): INR in the last 72 hours.  Recent Labs Lab 08/22/14 2044  08/23/14 0235  NA 133*  < > 136  K 5.5*  < > 4.2  CL 101  < > 101  CO2 21*  --  22  BUN 29*  < > 28*  CREATININE 1.80*  < > 1.75*  CALCIUM 8.7*  --  8.8*  PROT 7.1  --   --   BILITOT 1.5*  --   --   ALKPHOS 44  --   --   ALT 22  --   --   AST 39  --   --   GLUCOSE 171*  < > 139*  ALBUMIN 4.1  --   --   < > = values in this interval not displayed. MAGNESIUM  Date Value Ref Range Status  08/23/2014 2.2 1.7 - 2.4 mg/dL Final    Recent Labs  08/23/14 0235 08/23/14 0725  TROPONINI  0.03 0.03    Recent Labs  08/22/14 2046  TROPIPOC 0.01   No results found for: PROBNP Lab Results  Component Value Date   CHOL 93* 06/16/2014   HDL 34* 06/16/2014   LDLCALC 34 06/16/2014   TRIG 124 06/16/2014    AMYLASE  Date/Time Value Ref Range Status  03/13/2008 04:30 AM 47 27 - 131 U/L Final   TSH  Date/Time Value Ref Range Status  08/23/2014 02:35 AM 1.055 0.350 - 4.500 uIU/mL Final     Echo: pending  ECG:  HR 123 Atrial fibrillation Nonspecific IVCD with LAD LVH with secondary repolarization abnormality,old LBBB   Radiology:  Dg Chest Port 1 View  08/22/2014   CLINICAL DATA:  Sudden onset of shortness of breath and chest pain since this afternoon. Pain radiates to the right side and shoulder.  EXAM: PORTABLE CHEST - 1 VIEW  COMPARISON:  05/25/2008  FINDINGS: Patient is post CABG with intact sternal wires and multiple mediastinal clips. The heart is enlarged. Linear subpleural opacities in the right greater than left lower lungs, may reflect atelectasis versus minimal pulmonary edema. There is no vascular congestion. Mild hyperinflation, suspect underlying emphysema. No confluent airspace disease or pneumothorax. No large pleural effusion. No acute osseous abnormalities are seen.  IMPRESSION: 1. Cardiomegaly. Linear subpleural opacities may reflect minimal pulmonary edema versus atelectasis. 2. Mild hyperinflation.   Electronically Signed  By: Jeb Levering M.D.   On: 08/22/2014 21:14    ASSESSMENT AND PLAN:    Principal Problem:   Diastolic CHF Active Problems:   HTN (hypertension)   Shingles   Atrial fibrillation with RVR   CKD (chronic kidney disease) stage 3, GFR 30-59 ml/min   Hyperkalemia   CHF (congestive heart failure)    [name] is a 81 y.o. male with a history of HTN, PAF on apixaban, chronic diastolic CHF (EF 38%), CAD s/p CABG, AS s/p bioprosthetic AVR, CKD stage III and HTN who presented to [facility] ED on 08/22/14 with increasing shortness of  breath.  Acute diastolic CHF probably precipitated by A. fib with RVR and decreased lasix dosing. CXR with pulmonary edema and BNP 592. -- Agree with repeat 2D ECHO.  -- Given 24m IV Lasix with net neg 7278m Weight appears to be up? Continue to monitor. He is feeling better but needs additional diuresis. Continue 4078mV Lasix IV qd.   A. fib with RVR -  -- CHADSVASC score of at least 5 (HTN 1, CHF 1 and CAD 1 and age 68).3He is appropriately anticoagulated on apixaban. -- TSH normal -- Repeat 2D ECHO pending -- Currently rate controlled on atenolol 39m92m. HR in 90s. He could be cardioverted without TEE if need be as he has been complaint with AC.   Shingles - patient has rash on the right side of the chest. Characteristic of shingles -- Cont valacyclovir per IM. On contact precautions  CKD- difficult to know baseline- likely ~ 1.6. Creat a little improved from yesterday 1.8--> 1.75. Continue to monitor  Hyperkalemia - resolved with IV diuresis. 6.6--> 4.2 today. Continue to monitor  CAD s/p CABG - No chest pain. Troponin neg x2.. -- Continue statin and BB. No ASA due to chronic AC with apixaban.   History of prosthetic aortic valve replacement - 2-D ECHO pending.  Hyperglycemia - hemoglobin A1c pending.   HTN -well controlled on amlodipine 5mg 94m and atenolol 39mg 56m   Signed: THOMEileen Stanford 08/23/2014 9:21 AM  Pager [phone] MD

## 2014-08-25 NOTE — Interval H&P Note (Signed)
History and Physical Interval Note:  08/25/2014 10:02 AM  [name]  has presented today for surgery, with the diagnosis of a fib  The various methods of treatment have been discussed with the patient and family. After consideration of risks, benefits and other options for treatment, the patient has consented to  Procedure(s): CARDIOVERSION (N/A) as a surgical intervention .  The patient's history has been reviewed, patient examined, no change in status, stable for surgery.  I have reviewed the patient's chart and labs.  Questions were answered to the patient's satisfaction.     [name]

## 2014-08-25 NOTE — Progress Notes (Signed)
Patient Name: [name] Date of Encounter: 08/25/2014     Principal Problem:   Diastolic CHF Active Problems:   HTN (hypertension)   Shingles   Atrial fibrillation with RVR   CKD (chronic kidney disease) stage 3, GFR 30-59 ml/min   Hyperkalemia   CHF (congestive heart failure)   Acute on chronic combined systolic and diastolic heart failure    SUBJECTIVE  He is feeling MUCH better today. He says he is breathing at his baseline and feeling quite well. Able to lie flat with no difficulties. Awaitng DCCV today and ready for discharge home .    CURRENT MEDS . amiodarone  400 mg Oral BID  . amLODipine  5 mg Oral BID  . apixaban  5 mg Oral BID  . atenolol  25 mg Oral Daily  . atenolol  50 mg Oral QHS  . atorvastatin  20 mg Oral q1800  . furosemide  40 mg Oral Daily  . magnesium oxide  400 mg Oral Daily  . multivitamin with minerals  1 tablet Oral Daily  . omega-3 acid ethyl esters  2 g Oral BID  . pantoprazole  40 mg Oral Daily  . potassium chloride  20 mEq Oral Daily  . sodium chloride  3 mL Intravenous Q12H  . valACYclovir  1,000 mg Oral Q8H    OBJECTIVE  Filed Vitals:   08/24/14 2111 08/24/14 2148 08/25/14 0500 08/25/14 0545  BP: 119/53   123/62  Pulse: 71   72  Temp: 97.8 F (36.6 C)   97.6 F (36.4 C)  TempSrc: Oral   Oral  Resp: 18   18  Height:      Weight:  209 lb 10.5 oz (95.1 kg) 209 lb 10.5 oz (95.1 kg)   SpO2: 98%   98%    Intake/Output Summary (Last 24 hours) at 08/25/14 0758 Last data filed at 08/25/14 0500  Gross per 24 hour  Intake    540 ml  Output    800 ml  Net   -260 ml   Filed Weights   08/24/14 0657 08/24/14 2148 08/25/14 0500  Weight: 209 lb 3.5 oz (94.9 kg) 209 lb 10.5 oz (95.1 kg) 209 lb 10.5 oz (95.1 kg)    PHYSICAL EXAM  General: Pleasant, NAD. Neuro: Alert and oriented X 3. Moves all extremities spontaneously. Psych: Normal affect. HEENT:  Normal  Neck: Supple without bruits or JVD. Lungs:  Resp regular and  unlabored, CTA. Heart: irreg irreg. no s3, s4, or murmurs. Abdomen: Soft, non-tender, non-distended, BS + x 4.  Extremities: No clubbing, cyanosis or edema. DP/PT/Radials 2+ and equal bilaterally.  Accessory Clinical Findings  CBC  Recent Labs  08/22/14 2044 08/22/14 2102 08/23/14 0235  WBC 12.5*  --  10.4  NEUTROABS 10.3*  --  6.9  HGB 13.6 15.6 13.7  HCT 41.2 46.0 41.2  MCV 88.2  --  87.7  PLT 218  --  811   Basic Metabolic Panel  Recent Labs  08/23/14 0235 08/24/14 0420 08/25/14 0545  NA 136 134* 134*  K 4.2 3.5 4.1  CL 101 99* 106  CO2 _0 GLUCOSE 139* 126* 129*  BUN 28* 32* 36*  CREATININE 1.75* 1.73* 1.65*  CALCIUM 8.8* 8.4* 8.4*  MG 2.2  --   --    Liver Function Tests  Recent Labs  08/22/14 2044  AST 39  ALT 22  ALKPHOS 44  BILITOT 1.5*  PROT 7.1  ALBUMIN 4.1  No results for input(s): LIPASE, AMYLASE in the last 72 hours. Cardiac Enzymes  Recent Labs  08/23/14 0235 08/23/14 0725 08/23/14 1325  TROPONINI 0.03 0.03 0.03    Thyroid Function Tests  Recent Labs  08/23/14 0235  TSH 1.055    TELE  afib with CVR  Radiology/Studies  Dg Chest Port 1 View  08/22/2014   CLINICAL DATA:  Sudden onset of shortness of breath and chest pain since this afternoon. Pain radiates to the right side and shoulder.  EXAM: PORTABLE CHEST - 1 VIEW  COMPARISON:  05/25/2008  FINDINGS: Patient is post CABG with intact sternal wires and multiple mediastinal clips. The heart is enlarged. Linear subpleural opacities in the right greater than left lower lungs, may reflect atelectasis versus minimal pulmonary edema. There is no vascular congestion. Mild hyperinflation, suspect underlying emphysema. No confluent airspace disease or pneumothorax. No large pleural effusion. No acute osseous abnormalities are seen.  IMPRESSION: 1. Cardiomegaly. Linear subpleural opacities may reflect minimal pulmonary edema versus atelectasis. 2. Mild hyperinflation.    Electronically Signed   By: Jeb Levering M.D.   On: 08/22/2014 21:14    2D ECHO Study Date: 08/23/2014 LV EF: 30% -   35% Study Conclusions - Left ventricle: The cavity size was mildly dilated. Wall   thickness was increased in a pattern of mild LVH. Systolic   function was moderately to severely reduced. The estimated   ejection fraction was in the range of 30% to 35%. Diffuse   hypokinesis. - Ventricular septum: Septal motion showed paradox. These changes   are consistent with intraventricular conduction delay. - Aortic valve: A bioprosthesis was present and functioning   normally. Valve area (VTI): 1.1 cm^2. Valve area (Vmax): 1.1   cm^2. Valve area (Vmean): 0.99 cm^2. - Mitral valve: There was mild regurgitation. Valve area by   continuity equation (using LVOT flow): 1.07 cm^2. - Left atrium: The atrium was severely dilated. - Right ventricle: Systolic function was reduced. - Right atrium: The atrium was moderately dilated.   ASSESSMENT AND PLAN  RAYFIELD BEEM is a 81 y.o. male with a history of HTN, PAF on apixaban, chronic diastolic CHF (EF 29%), CAD s/p CABG, AS s/p bioprosthetic AVR, CKD stage III and HTN who presented to [facility] ED on 08/22/14 with increasing shortness of breath.  Acute mixed systolic and diastolic CHF probably precipitated by A. fib with RVR and decreased lasix dosing. CXR with pulmonary edema and BNP 592. -- 2D ECHO with newly reduced EF 30-35% ( down from 51% in 2012) and diffuse HK. -- He has been on 71m IV Lasix IV qd. Net neg 1.5 L. Weight down 1 lb from admission. Appears euvolemic and breathing back to baseline. Converted to PO lasix yesterday and doing well.   A. fib with RVR - Currently rate controlled on atenolol 278mqd and amio 40094mID (started yesterday) -- CHADSVASC score of at least 5 (HTN 1, CHF 1 and CAD 1 and age 57).20He is appropriately anticoagulated on apixaban. He has not missed a dose of Eliquis over the past few months. We will  proceed with DCCV without TEE. Plan for this today.  -- TSH normal -- Repeat 2D ECHO with newly reduced EF and diffuse HK- tachycardia mediated? Also severe LA dilaiton  CAD s/p CABG - No chest pain. Troponin neg x3. -- With newly reduced EF he may need an ischemic eval. However, this could be tachycardia mediated CM. We will first attempt to cardiovert him into  NSR and then re-visit further ischemic work up.  -- Continue statin and BB. No ASA due to chronic AC with apixaban.   Shingles - patient has rash on the right side of the chest. Characteristic of shingles -- Cont valacyclovir per IM. On contact precautions  CKD- difficult to know baseline- likely ~ 1.7. Creat a little improved from yesterday 1.8--> 1.75--> 1.73--> 1.65. He seems to be around his baseline. Continue to monitor  Hyperkalemia - resolved. Continue to monitor  History of prosthetic aortic valve replacement -  2D ECHO with normal functioning bioprosthesis valve.   HTN -well controlled on amlodipine 44m BID and atenolol 228mqd.    SiJudy PimpleA-C  Pager [phone]

## 2014-08-25 NOTE — Anesthesia Procedure Notes (Signed)
Procedure Name: MAC Date/Time: 08/25/2014 10:30 AM Performed by: Neldon Newport Pre-anesthesia Checklist: Suction available, Emergency Drugs available, Timeout performed, Patient identified and Patient being monitored Patient Re-evaluated:Patient Re-evaluated prior to inductionOxygen Delivery Method: Ambu bag Preoxygenation: Pre-oxygenation with 100% oxygen Intubation Type: IV induction Ventilation: Mask ventilation without difficulty

## 2014-08-25 NOTE — Transfer of Care (Signed)
Immediate Anesthesia Transfer of Care Note  Patient: [name]  Procedure(s) Performed: Procedure(s): CARDIOVERSION (N/A)  Patient Location: Endoscopy Unit  Anesthesia Type:MAC  Level of Consciousness: awake, alert  and oriented  Airway & Oxygen Therapy: Patient Spontanous Breathing and Patient connected to nasal cannula oxygen  Post-op Assessment: Report given to RN, Post -op Vital signs reviewed and stable and Patient moving all extremities X 4  Post vital signs: Reviewed and stable  Last Vitals:  Filed Vitals:   08/25/14 1012  BP: 152/85  Pulse: 90  Temp: 36.7 C  Resp: 18    Complications: No apparent anesthesia complications

## 2014-08-25 NOTE — Op Note (Signed)
Procedure: Electrical Cardioversion Indications:  Atrial Fibrillation  Procedure Details:  Consent: Risks of procedure as well as the alternatives and risks of each were explained to the (patient/caregiver).  Consent for procedure obtained.  Time Out: Verified patient identification, verified procedure, site/side was marked, verified correct patient position, special equipment/implants available, medications/allergies/relevent history reviewed, required imaging and test results available.  Performed  Patient placed on cardiac monitor, pulse oximetry, supplemental oxygen as necessary.  Sedation given: propofol 80 mg IV, Dr Conrad Kilgore Pacer pads placed anterior and posterior chest.  Cardioverted 1 time(s).  Cardioverted at 120J, synchronized biphasic  Evaluation: Findings: Post procedure EKG shows: sinus bradycardia with ventricular couplets, settled into a pattern of ventricular trigeminy Complications: None Patient did tolerate procedure well.  Time Spent Directly with the Patient:  30 minutes   Jim Lundin 08/25/2014, 10:42 AM

## 2014-08-25 NOTE — Anesthesia Postprocedure Evaluation (Signed)
Anesthesia Post Note  Patient: [name]  Procedure(s) Performed: Procedure(s) (LRB): CARDIOVERSION (N/A)  Anesthesia type: general  Patient location: PACU  Post pain: Pain level controlled  Post assessment: Patient's Cardiovascular Status Stable  Last Vitals:  Filed Vitals:   08/25/14 1041  BP: 109/50  Pulse: 55  Temp:   Resp: 16    Post vital signs: Reviewed and stable  Level of consciousness: sedated  Complications: No apparent anesthesia complications

## 2014-08-25 NOTE — Progress Notes (Signed)
[facility] PROGRESS NOTE  [name] RDE:[id number] DOB: 1932-11-05 DOA: 08/22/2014 PCP: Pcp Not In System  Brief narrative 81 year old male with history of systolic and diastolic CHF, A. fib on Eliquis, CAD status post CABG, bioprosthetic aortic valve replacement, chronic kidney disease stage III presented to the ED on 5/16 with increased shortness of breath found to be in acute systolic CHF and A. fib with RVR. Patient started on IV diuresis and required IV Cardizem on admission. Seen by cardiology and patient underwent DC cardioversion on 5/18.  Assessment/Plan: Principal Problem:  Acute on chronic systolic CHF:  Active precipitated by rapid A. fib. Markedly improved with IV diuresis. Now on oral Lasix. Continue atenolol. Resume lisinopril upon discharge.   Active Problems:  Atrial fibrillation with RVR:  Required IV Cardizem on admission, currently rate controlled with atenolol. On Eliquis.  Seen by cardiology and patient underwent successful DC cardioversion today. Started on amiodarone.   NSVT:  Replenished potassium and magnesium.   HTN (hypertension):  Stable . continue atenolol, amlodipine.    Chronic kidney disease stage III:  Creatinine at baseline.   Hyperkalemia:  Resolved with Kayexalate. Lisinopril held. Will resume upon discharge.Marland Kitchen    History of CAD:  Status post CABG.  Stable. Continue beta blocker and statin.   History of aortic valve replacement with bioprosthetic valve: 2-D echo unremarkable   GERD:  Continue PPI   Herpes zoster: Stable without any discharge. Continue Valtrex.  Code Status: Full code Family Communication: None at bedside Disposition Plan: Home in a.m. if stable.   Consultants:  Etiology  Procedures:  DC cardioversion on 5/18  Antibiotics:  On Valtrex  HPI/Subjective: Patient seen and examined after returning from cardioversion. Denies shortness of breath or palpitations.  Objective: Filed  Vitals:   08/25/14 1200  BP: 151/64  Pulse:   Temp:   Resp: 18    Intake/Output Summary (Last 24 hours) at 08/25/14 1721 Last data filed at 08/25/14 1400  Gross per 24 hour  Intake    440 ml  Output    400 ml  Net     40 ml   Filed Weights   08/24/14 0657 08/24/14 2148 08/25/14 0500  Weight: 94.9 kg (209 lb 3.5 oz) 95.1 kg (209 lb 10.5 oz) 95.1 kg (209 lb 10.5 oz)    Exam:   General: Elderly male in no acute distress  HEENT: No pallor, moist oral mucosa, supple neck, no JVD  Chest: Clear to auscultation bilaterally  CVS: Normal S1 and S2, systolic murmur 2/6  GI: Soft, nondistended, nontender, bowel sounds present  Muscles continue: Warm, no edema, herpetic rash over the torso  CNS: Alert and oriented  Data Reviewed: Basic Metabolic Panel:  Recent Labs Lab 08/22/14 2044 08/22/14 2102 08/23/14 0235 08/24/14 0420 08/25/14 0545  NA 133* 133* 136 134* 134*  K 5.5* 6.6* 4.2 3.5 4.1  CL 101 102 101 99* 106  CO2 21*  --  _0 GLUCOSE 171* 169* 139* 126* 129*  BUN 29* 45* 28* 32* 36*  CREATININE 1.80* 1.80* 1.75* 1.73* 1.65*  CALCIUM 8.7*  --  8.8* 8.4* 8.4*  MG  --   --  2.2  --   --    Liver Function Tests:  Recent Labs Lab 08/22/14 2044  AST 39  ALT 22  ALKPHOS 44  BILITOT 1.5*  PROT 7.1  ALBUMIN 4.1   No results for input(s): LIPASE, AMYLASE in the last 168 hours. No results for input(s): AMMONIA  in the last 168 hours. CBC:  Recent Labs Lab 08/22/14 2044 08/22/14 2102 08/23/14 0235  WBC 12.5*  --  10.4  NEUTROABS 10.3*  --  6.9  HGB 13.6 15.6 13.7  HCT 41.2 46.0 41.2  MCV 88.2  --  87.7  PLT 218  --  188   Cardiac Enzymes:  Recent Labs Lab 08/23/14 0235 08/23/14 0725 08/23/14 1325  TROPONINI 0.03 0.03 0.03   BNP (last 3 results)  Recent Labs  08/22/14 2044  BNP 592.3*    ProBNP (last 3 results) No results for input(s): PROBNP in the last 8760 hours.  CBG: No results for input(s): GLUCAP in the last 168  hours.  No results found for this or any previous visit (from the past 240 hour(s)).   Studies: No results found.  Scheduled Meds: . amiodarone  400 mg Oral BID  . amLODipine  5 mg Oral BID  . apixaban  5 mg Oral BID  . atenolol  25 mg Oral Daily  . atenolol  50 mg Oral QHS  . atorvastatin  20 mg Oral q1800  . furosemide  40 mg Oral Daily  . magnesium oxide  400 mg Oral Daily  . multivitamin with minerals  1 tablet Oral Daily  . omega-3 acid ethyl esters  2 g Oral BID  . pantoprazole  40 mg Oral Daily  . potassium chloride  20 mEq Oral Daily  . sodium chloride  3 mL Intravenous Q12H  . valACYclovir  1,000 mg Oral Q8H   Continuous Infusions:      Time spent: [place], [facility] Pager [phone] If 7PM-7AM, please contact night-coverage at [email or web address], password [facility] 08/25/2014, 5:21 PM  LOS: 3 days

## 2014-08-26 ENCOUNTER — Encounter (HOSPITAL_COMMUNITY): Payer: Self-pay | Admitting: Cardiovascular Disease

## 2014-08-26 DIAGNOSIS — I10 Essential (primary) hypertension: Secondary | ICD-10-CM

## 2014-08-26 DIAGNOSIS — N183 Chronic kidney disease, stage 3 (moderate): Secondary | ICD-10-CM

## 2014-08-26 LAB — BASIC METABOLIC PANEL
Anion gap: 10 (ref 5–15)
BUN: 37 mg/dL — AB (ref 6–20)
CO2: 23 mmol/L (ref 22–32)
CREATININE: 1.72 mg/dL — AB (ref 0.61–1.24)
Calcium: 8.5 mg/dL — ABNORMAL LOW (ref 8.9–10.3)
Chloride: 105 mmol/L (ref 101–111)
GFR calc Af Amer: 42 mL/min — ABNORMAL LOW (ref >60)
GFR calc non Af Amer: 36 mL/min — ABNORMAL LOW (ref >60)
Glucose, Bld: 127 mg/dL — ABNORMAL HIGH (ref 65–99)
Potassium: 4.2 mmol/L (ref 3.5–5.1)
Sodium: 138 mmol/L (ref 135–145)

## 2014-08-26 MED ORDER — FUROSEMIDE 20 MG PO TABS: 40.0000 mg | ORAL_TABLET | Freq: Every day | ORAL | Status: DC

## 2014-08-26 MED ORDER — ATENOLOL 25 MG PO TABS: ORAL_TABLET | ORAL | Status: DC

## 2014-08-26 MED ORDER — VALACYCLOVIR HCL 1 G PO TABS: 1000.0000 mg | ORAL_TABLET | Freq: Three times a day (TID) | ORAL | Status: DC

## 2014-08-26 MED ORDER — AMIODARONE HCL 400 MG PO TABS: 400.0000 mg | ORAL_TABLET | Freq: Two times a day (BID) | ORAL | Status: DC

## 2014-08-26 NOTE — Progress Notes (Signed)
D/C instructions reviewed w/pt. Pt verbalizes understanding and no further questions. Pt d/c in w/c by NT to son's car. Pt in possession of d/c instructions, scripts, and all personal belongings.

## 2014-08-26 NOTE — Discharge Summary (Signed)
Physician Discharge Summary  [name] [id number]:[id number] DOB: 04/30/1932 DOA: 08/22/2014  PCP: Briscoe Deutscher, M.D.   Admit date: 08/22/2014 Discharge date: 08/26/2014  Time spent: 35 minutes  Recommendations for Outpatient Follow-up:  1. Discharge home with outpatient PCP and cardiology follow-up   Discharge Diagnoses:  Principal Problem:   Acute on chronic combined systolic and diastolic heart failure  Active Problems:   Atrial fibrillation with RVR   HTN (hypertension)   Shingles   CKD (chronic kidney disease) stage 3, GFR 30-59 ml/min   Hyperkalemia   Discharge Condition: Fair  Diet recommendation: Heart healthy  Filed Weights   08/24/14 2148 08/25/14 0500 08/26/14 0437  Weight: 95.1 kg (209 lb 10.5 oz) 95.1 kg (209 lb 10.5 oz) 94.5 kg (208 lb 5.4 oz)    History of present illness:  Please refer to admission H&P for details, in brief, 81 year old male with history of systolic and diastolic CHF, A. fib on Eliquis, CAD status post CABG, bioprosthetic aortic valve replacement, chronic kidney disease stage III presented to the ED on 5/16 with increased shortness of breath found to be in acute systolic CHF and A. fib with RVR. Patient started on IV diuresis and required IV Cardizem on admission. Seen by cardiology and patient underwent DC cardioversion on 5/18  Hospital Course:  Principal Problem:  Acute on chronic systolic CHF:  Likely precipitated by rapid A. fib. Markedly improved with IV diuresis. 2-D echo shows worsened EF of 30-35% (from 51% in 2012) with diffuse hypokinesis. Increased home dose of Lasix to 40 mg daily. Reduce  home dose atenolol to . Resume lisinopril upon discharge. Continue statin. Not on aspirin as patient already on anticoagulation. -Patient currently euvolemic. He is stable to be discharged home with outpatient follow-up. Refused home health and informs that he has been compliant with his medications and checks his weight daily. He is aware  of Heart failure symptoms and agrees to contact his PCP or return to ED if he has similar symptoms. -R logic and an ischemic workup as outpatient.  Active Problems:  Atrial fibrillation with RVR:  Required IV Cardizem on admission, currently rate controlled with atenolol.( dose reduced)  On Eliquis. TSH normal. Seen by cardiology and patient underwent successful DC cardioversion on 5/18. Started on amiodarone.   NSVT:  Replenished potassium and magnesium.   HTN (hypertension):  Stable . continue atenolol and amlodipine. Resume lisinopril.   Chronic kidney disease stage III:  Creatinine at baseline.   Hyperkalemia:  Resolved with Kayexalate. Lisinopril held and resumed upon discharge.    History of CAD:  Status post CABG.  Stable. Continue beta blocker and statin.   History of aortic valve replacement with bioprosthetic valve: 2-D echo shows stable bioprosthesis   GERD:  Continue PPI   Herpes zoster: Stable without any discharge. Completes 7 days of Valtrex on 5/22  Code Status: Full code Family Communication: None at bedside Disposition Plan: Home    Consultants:  cardiology   Procedures:  2-D echo: EF of 30-35% with diffuse hypokinesis, normal aortic bioprosthesis., Mild mitral regurgitation and severely dilated left atrium.  DC cardioversion on 5/18  Antibiotics:  On Valtrex    Discharge Exam: Filed Vitals:   08/26/14 1000  BP: 123/44  Pulse:   Temp:   Resp: 16     General: Elderly male in no acute distress  HEENT: No pallor, moist oral mucosa, supple neck, no JVD  Chest: Clear to auscultation bilaterally  CVS: S1 and S2 regular, systolic murmur  2/6  GI: Soft, nondistended, nontender, bowel sounds present  Musculoskeletal: Warm, no edema, improved herpetic rash over the torso  CNS: Alert and oriented  Discharge Instructions    Current Discharge Medication List    START taking these medications   Details   amiodarone (PACERONE) 400 MG tablet Take 1 tablet (400 mg total) by mouth 2 (two) times daily. Qty: 60 tablet, Refills: 0    valACYclovir (VALTREX) 1000 MG tablet Take 1 tablet (1,000 mg total) by mouth every 8 (eight) hours. Qty: 12 tablet, Refills: 0      CONTINUE these medications which have CHANGED   Details  furosemide (LASIX) 20 MG tablet Take 2 tablets (40 mg total) by mouth daily. Qty: 60 tablet, Refills: 0  atenolol (TENORMIN) 25 MG tablet 1 tab in the am       CONTINUE these medications which have NOT CHANGED   Details  amLODipine (NORVASC) 10 MG tablet Take 5 mg by mouth 2 (two) times daily.     apixaban (ELIQUIS) 5 MG TABS tablet Take 1 tablet (5 mg total) by mouth 2 (two) times daily. Qty: 180 tablet, Refills: 3         atorvastatin (LIPITOR) 20 MG tablet Take 20 mg by mouth daily.    lisinopril (PRINIVIL,ZESTRIL) 40 MG tablet Take 40 mg by mouth daily.    Magnesium 250 MG TABS Take 1 tablet by mouth daily.     Multiple Vitamin (MULTIVITAMIN) capsule Take 1 capsule by mouth daily.    omega-3 acid ethyl esters (LOVAZA) 1 G capsule Take 2 g by mouth 2 (two) times daily.    omeprazole (PRILOSEC) 20 MG capsule Take 20 mg by mouth daily.    Potassium Gluconate 595 MG CAPS Take 1 capsule by mouth daily.    traMADol (ULTRAM) 50 MG tablet Take 50 mg by mouth every 6 (six) hours as needed for moderate pain or severe pain (pain).       Allergies  Allergen Reactions  . Novocain [Procaine] Swelling   Follow-up Information    Follow up with Sueanne Margarita, MD.   Specialty:  Cardiology   Why:  The office will call.   Contact information:   [address] [place] [address] [phone]        The results of significant diagnostics from this hospitalization (including imaging, microbiology, ancillary and laboratory) are listed below for reference.    Significant Diagnostic Studies: Dg Chest Port 1 View  08/22/2014   CLINICAL DATA:  Sudden  onset of shortness of breath and chest pain since this afternoon. Pain radiates to the right side and shoulder.  EXAM: PORTABLE CHEST - 1 VIEW  COMPARISON:  05/25/2008  FINDINGS: Patient is post CABG with intact sternal wires and multiple mediastinal clips. The heart is enlarged. Linear subpleural opacities in the right greater than left lower lungs, may reflect atelectasis versus minimal pulmonary edema. There is no vascular congestion. Mild hyperinflation, suspect underlying emphysema. No confluent airspace disease or pneumothorax. No large pleural effusion. No acute osseous abnormalities are seen.  IMPRESSION: 1. Cardiomegaly. Linear subpleural opacities may reflect minimal pulmonary edema versus atelectasis. 2. Mild hyperinflation.   Electronically Signed   By: Jeb Levering M.D.   On: 08/22/2014 21:14    Microbiology: No results found for this or any previous visit (from the past 240 hour(s)).   Labs: Basic Metabolic Panel:  Recent Labs Lab 08/22/14 2044 08/22/14 2102 08/23/14 0623 08/24/14 7628 08/25/14 0545 08/26/14 0535  NA 133* 133* 136 134* 134* 138  K 5.5* 6.6* 4.2 3.5 4.1 4.2  CL 101 102 101 99* 106 105  CO2 21*  --  _0 GLUCOSE 171* 169* 139* 126* 129* 127*  BUN 29* 45* 28* 32* 36* 37*  CREATININE 1.80* 1.80* 1.75* 1.73* 1.65* 1.72*  CALCIUM 8.7*  --  8.8* 8.4* 8.4* 8.5*  MG  --   --  2.2  --   --   --    Liver Function Tests:  Recent Labs Lab 08/22/14 2044  AST 39  ALT 22  ALKPHOS 44  BILITOT 1.5*  PROT 7.1  ALBUMIN 4.1   No results for input(s): LIPASE, AMYLASE in the last 168 hours. No results for input(s): AMMONIA in the last 168 hours. CBC:  Recent Labs Lab 08/22/14 2044 08/22/14 2102 08/23/14 0235  WBC 12.5*  --  10.4  NEUTROABS 10.3*  --  6.9  HGB 13.6 15.6 13.7  HCT 41.2 46.0 41.2  MCV 88.2  --  87.7  PLT 218  --  188   Cardiac Enzymes:  Recent Labs Lab 08/23/14 0235 08/23/14 0725 08/23/14 1325  TROPONINI 0.03 0.03 0.03    BNP: BNP (last 3 results)  Recent Labs  08/22/14 2044  BNP 592.3*    ProBNP (last 3 results) No results for input(s): PROBNP in the last 8760 hours.  CBG: No results for input(s): GLUCAP in the last 168 hours.     SignedLouellen Molder  [facility] 08/26/2014, 11:02 AM

## 2014-08-26 NOTE — Discharge Instructions (Signed)
Heart Failure Heart failure is a condition in which the heart has trouble pumping blood. This means your heart does not pump blood efficiently for your body to work well. In some cases of heart failure, fluid may back up into your lungs or you may have swelling (edema) in your lower legs. Heart failure is usually a long-term (chronic) condition. It is important for you to take good care of yourself and follow your health care provider's treatment plan. CAUSES  Some health conditions can cause heart failure. Those health conditions include:  High blood pressure (hypertension). Hypertension causes the heart muscle to work harder than normal. When pressure in the blood vessels is high, the heart needs to pump (contract) with more force in order to circulate blood throughout the body. High blood pressure eventually causes the heart to become stiff and weak.  Coronary artery disease (CAD). CAD is the buildup of cholesterol and fat (plaque) in the arteries of the heart. The blockage in the arteries deprives the heart muscle of oxygen and blood. This can cause chest pain and may lead to a heart attack. High blood pressure can also contribute to CAD.  Heart attack (myocardial infarction). A heart attack occurs when one or more arteries in the heart become blocked. The loss of oxygen damages the muscle tissue of the heart. When this happens, part of the heart muscle dies. The injured tissue does not contract as well and weakens the heart's ability to pump blood.  Abnormal heart valves. When the heart valves do not open and close properly, it can cause heart failure. This makes the heart muscle pump harder to keep the blood flowing.  Heart muscle disease (cardiomyopathy or myocarditis). Heart muscle disease is damage to the heart muscle from a variety of causes. These can include drug or alcohol abuse, infections, or unknown reasons. These can increase the risk of heart failure.  Lung disease. Lung disease  makes the heart work harder because the lungs do not work properly. This can cause a strain on the heart, leading it to fail.  Diabetes. Diabetes increases the risk of heart failure. High blood sugar contributes to high fat (lipid) levels in the blood. Diabetes can also cause slow damage to tiny blood vessels that carry important nutrients to the heart muscle. When the heart does not get enough oxygen and food, it can cause the heart to become weak and stiff. This leads to a heart that does not contract efficiently.  Other conditions can contribute to heart failure. These include abnormal heart rhythms, thyroid problems, and low blood counts (anemia). Certain unhealthy behaviors can increase the risk of heart failure, including:  Being overweight.  Smoking or chewing tobacco.  Eating foods high in fat and cholesterol.  Abusing illicit drugs or alcohol.  Lacking physical activity. SYMPTOMS  Heart failure symptoms may vary and can be hard to detect. Symptoms may include:  Shortness of breath with activity, such as climbing stairs.  Persistent cough.  Swelling of the feet, ankles, legs, or abdomen.  Unexplained weight gain.  Difficulty breathing when lying flat (orthopnea).  Waking from sleep because of the need to sit up and get more air.  Rapid heartbeat.  Fatigue and loss of energy.  Feeling light-headed, dizzy, or close to fainting.  Loss of appetite.  Nausea.  Increased urination during the night (nocturia). DIAGNOSIS  A diagnosis of heart failure is based on your history, symptoms, physical examination, and diagnostic tests. Diagnostic tests for heart failure may include:  Echocardiography.  Electrocardiography.  Chest X-ray.  Blood tests.  Exercise stress test.  Cardiac angiography.  Radionuclide scans. TREATMENT  Treatment is aimed at managing the symptoms of heart failure. Medicines, behavioral changes, or surgical intervention may be necessary to  treat heart failure.  Medicines to help treat heart failure may include:  Angiotensin-converting enzyme (ACE) inhibitors. This type of medicine blocks the effects of a blood protein called angiotensin-converting enzyme. ACE inhibitors relax (dilate) the blood vessels and help lower blood pressure.  Angiotensin receptor blockers (ARBs). This type of medicine blocks the actions of a blood protein called angiotensin. Angiotensin receptor blockers dilate the blood vessels and help lower blood pressure.  Water pills (diuretics). Diuretics cause the kidneys to remove salt and water from the blood. The extra fluid is removed through urination. This loss of extra fluid lowers the volume of blood the heart pumps.  Beta blockers. These prevent the heart from beating too fast and improve heart muscle strength.  Digitalis. This increases the force of the heartbeat.  Healthy behavior changes include:  Obtaining and maintaining a healthy weight.  Stopping smoking or chewing tobacco.  Eating heart-healthy foods.  Limiting or avoiding alcohol.  Stopping illicit drug use.  Physical activity as directed by your health care provider.  Surgical treatment for heart failure may include:  A procedure to open blocked arteries, repair damaged heart valves, or remove damaged heart muscle tissue.  A pacemaker to improve heart muscle function and control certain abnormal heart rhythms.  An internal cardioverter defibrillator to treat certain serious abnormal heart rhythms.  A left ventricular assist device (LVAD) to assist the pumping ability of the heart. HOME CARE INSTRUCTIONS   Take medicines only as directed by your health care provider. Medicines are important in reducing the workload of your heart, slowing the progression of heart failure, and improving your symptoms.  Do not stop taking your medicine unless directed by your health care provider.  Do not skip any dose of medicine.  Refill your  prescriptions before you run out of medicine. Your medicines are needed every day.  Engage in moderate physical activity if directed by your health care provider. Moderate physical activity can benefit some people. The elderly and people with severe heart failure should consult with a health care provider for physical activity recommendations.  Eat heart-healthy foods. Food choices should be free of trans fat and low in saturated fat, cholesterol, and salt (sodium). Healthy choices include fresh or frozen fruits and vegetables, fish, lean meats, legumes, fat-free or low-fat dairy products, and whole grain or high fiber foods. Talk to a dietitian to learn more about heart-healthy foods.  Limit sodium if directed by your health care provider. Sodium restriction may reduce symptoms of heart failure in some people. Talk to a dietitian to learn more about heart-healthy seasonings.  Use healthy cooking methods. Healthy cooking methods include roasting, grilling, broiling, baking, poaching, steaming, or stir-frying. Talk to a dietitian to learn more about healthy cooking methods.  Limit fluids if directed by your health care provider. Fluid restriction may reduce symptoms of heart failure in some people.  Weigh yourself every day. Daily weights are important in the early recognition of excess fluid. You should weigh yourself every morning after you urinate and before you eat breakfast. Wear the same amount of clothing each time you weigh yourself. Record your daily weight. Provide your health care provider with your weight record.  Monitor and record your blood pressure if directed by your health care  provider.  Check your pulse if directed by your health care provider.  Lose weight if directed by your health care provider. Weight loss may reduce symptoms of heart failure in some people.  Stop smoking or chewing tobacco. Nicotine makes your heart work harder by causing your blood vessels to constrict.  Do not use nicotine gum or patches before talking to your health care provider.  Keep all follow-up visits as directed by your health care provider. This is important.  Limit alcohol intake to no more than 1 drink per day for nonpregnant women and 2 drinks per day for men. One drink equals 12 ounces of beer, 5 ounces of wine, or 1 ounces of hard liquor. Drinking more than that is harmful to your heart. Tell your health care provider if you drink alcohol several times a week. Talk with your health care provider about whether alcohol is safe for you. If your heart has already been damaged by alcohol or you have severe heart failure, drinking alcohol should be stopped completely.  Stop illicit drug use.  Stay up-to-date with immunizations. It is especially important to prevent respiratory infections through current pneumococcal and influenza immunizations.  Manage other health conditions such as hypertension, diabetes, thyroid disease, or abnormal heart rhythms as directed by your health care provider.  Learn to manage stress.  Plan rest periods when fatigued.  Learn strategies to manage high temperatures. If the weather is extremely hot:  Avoid vigorous physical activity.  Use air conditioning or fans or seek a cooler location.  Avoid caffeine and alcohol.  Wear loose-fitting, lightweight, and light-colored clothing.  Learn strategies to manage cold temperatures. If the weather is extremely cold:  Avoid vigorous physical activity.  Layer clothes.  Wear mittens or gloves, a hat, and a scarf when going outside.  Avoid alcohol.  Obtain ongoing education and support as needed.  Participate in or seek rehabilitation as needed to maintain or improve independence and quality of life. SEEK MEDICAL CARE IF:   Your weight increases by 03 lb/1.4 kg in 1 day or 05 lb/2.3 kg in a week.  You have increasing shortness of breath that is unusual for you.  You are unable to participate in  your usual physical activities.  You tire easily.  You cough more than normal, especially with physical activity.  You have any or more swelling in areas such as your hands, feet, ankles, or abdomen.  You are unable to sleep because it is hard to breathe.  You feel like your heart is beating fast (palpitations).  You become dizzy or light-headed upon standing up. SEEK IMMEDIATE MEDICAL CARE IF:   You have difficulty breathing.  There is a change in mental status such as decreased alertness or difficulty with concentration.  You have a pain or discomfort in your chest.  You have an episode of fainting (syncope). MAKE SURE YOU:   Understand these instructions.  Will watch your condition.  Will get help right away if you are not doing well or get worse. Document Released: 03/26/2005 Document Revised: 08/10/2013 Document Reviewed: 04/25/2012 [facility] Patient Information 2015 [place], [place]. This information is not intended to replace advice given to you by your health care provider. Make sure you discuss any questions you have with your health care provider.

## 2014-08-26 NOTE — Progress Notes (Signed)
Patient Name: [name] Date of Encounter: 08/26/2014  Principal Problem:   Diastolic CHF Active Problems:   HTN (hypertension)   Shingles   Atrial fibrillation with RVR   CKD (chronic kidney disease) stage 3, GFR 30-59 ml/min   Hyperkalemia   CHF (congestive heart failure)   Acute on chronic combined systolic and diastolic heart failure   Atrial fibrillation, unspecified   Primary Cardiologist: Dr Radford Pax  Patient Profile: 81 yo male w/ hx HTN, PAF on Eliquis, D-CHF w/ EF 51%, CABG, AS s/p bioprosthetic AVR, CKD III, admitted 05/15 w/ SOB, CHF exacerbation and rapid afib. S/p DCCV on 05/18.   SUBJECTIVE: Breathing well, has been up & around in the room. Hopes for D/C today. No palpitations, presyncope or dizziness  OBJECTIVE Filed Vitals:   08/25/14 1200 08/25/14 2134 08/26/14 0123 08/26/14 0437  BP: 151/64 109/64 105/71 130/47  Pulse:  33 52 50  Temp:  98.2 F (36.8 C) 97.6 F (36.4 C) 97.5 F (36.4 C)  TempSrc:  Oral Oral Oral  Resp: _0 Height:      Weight:    94.5 kg (208 lb 5.4 oz)  SpO2: 99% 97% 94% 96%    Intake/Output Summary (Last 24 hours) at 08/26/14 0902 Last data filed at 08/26/14 0813  Gross per 24 hour  Intake   1280 ml  Output      0 ml  Net   1280 ml   Filed Weights   08/24/14 2148 08/25/14 0500 08/26/14 0437  Weight: 95.1 kg (209 lb 10.5 oz) 95.1 kg (209 lb 10.5 oz) 94.5 kg (208 lb 5.4 oz)    PHYSICAL EXAM General: Well developed, well nourished, male in no acute distress. Head: Normocephalic, atraumatic.  Neck: Supple without bruits, JVD 7 cm. Lungs:  Resp regular and unlabored, slightly decreased BS bases. Heart: RRR, S1, S2, no S3, S4, soft murmur; no rub. Abdomen: Soft, non-tender, non-distended, BS + x 4.  Extremities: No clubbing, cyanosis, no edema.  Neuro: Alert and oriented X 3. Moves all extremities spontaneously. Psych: Normal affect.  LABS: Basic Metabolic Panel: Recent Labs  08/25/14 0545  08/26/14 0535  NA 134* 138  K 4.1 4.2  CL 106 105  CO2 22 23  GLUCOSE 129* 127*  BUN 36* 37*  CREATININE 1.65* 1.72*  CALCIUM 8.4* 8.5*   Cardiac Enzymes: Recent Labs  08/23/14 1325  TROPONINI 0.03   BNP:  B NATRIURETIC PEPTIDE  Date/Time Value Ref Range Status  08/22/2014 08:44 PM 592.3* 0.0 - 100.0 pg/mL Final    TELE:   SR, sinus brady in high 40s at times, PVCs and short (<10 bts) NSVT).     Current Medications:  . amiodarone  400 mg Oral BID  . amLODipine  5 mg Oral BID  . apixaban  5 mg Oral BID  . atenolol  25 mg Oral Daily  . atenolol  50 mg Oral QHS  . atorvastatin  20 mg Oral q1800  . furosemide  40 mg Oral Daily  . magnesium oxide  400 mg Oral Daily  . multivitamin with minerals  1 tablet Oral Daily  . omega-3 acid ethyl esters  2 g Oral BID  . pantoprazole  40 mg Oral Daily  . potassium chloride  20 mEq Oral Daily  . sodium chloride  3 mL Intravenous Q12H  . valACYclovir  1,000 mg Oral Q8H      ASSESSMENT AND PLAN: Acute mixed systolic and diastolic  CHF probably precipitated by A. fib with RVR and decreased lasix dosing. CXR with pulmonary edema and BNP 592 05/15. -- 2D ECHO with newly reduced EF 30-35% ( down from 51% in 2012) and diffuse HK. -- He has been on 72m IV Lasix IV qd. Net neg 1.5 L. Weight down 2 lbs from admission. Appears euvolemic and breathing back to baseline. Converted to PO lasix 05/17 and doing well.   A. fib with RVR  -- S/P DCCV 05/18, Currently SR on atenolol 266mqd and amio 40027mID (started 05/17) -- CHADSVASC score of at least 5 (HTN 1, CHF 1 and CAD 1 and age 15).67He is appropriately anticoagulated on apixaban. He has not missed a dose of Eliquis over the past few months.  -- TSH normal -- 2D ECHO with newly reduced EF and diffuse HK- tachycardia mediated? Also severe LA dilataton, may cause problems maintaining SR  CAD s/p CABG  -- No chest pain. Troponin neg x3. -- With newly reduced EF he may need an ischemic eval.   -- re-visit further ischemic work up as OP if his EF does not improve w/ SR  -- Continue statin and BB. No ASA due to chronic AC with apixaban.   Shingles  -- patient has rash on the right side of the chest. Characteristic of shingles -- Cont valacyclovir per IM. On contact precautions  CKD- difficult to know baseline- likely ~ 1.7. Creat a little improved from yesterday 1.8--> 1.75--> 1.73--> 1.65-->1.72. He seems to be around his baseline. Continue to monitor  Hyperkalemia - resolved. Continue to monitor  History of prosthetic aortic valve replacement - 2D ECHO with normal functioning bioprosthesis valve.   HTN -well controlled on amlodipine 5mg57mD and atenolol 25mg50m   OK for d/c from a cardiac standpoint, message sent for TCM appt.   SigneJonetta Speak-C 9:02 AM 08/26/2014

## 2014-09-01 ENCOUNTER — Telehealth: Payer: Self-pay | Admitting: Cardiology

## 2014-09-01 NOTE — Telephone Encounter (Signed)
Patient contacted regarding discharge from  [facility] on 08/26/2014.  Patient understands to follow up with provider Richardson Dopp PA on 09/03/2014 at  1:30 pm at [address]. Patient understands discharge instructions? yes Patient understands medications and regiment? yes Patient understands to bring all medications to this visit? yes

## 2014-09-01 NOTE — Telephone Encounter (Signed)
TCM per Staff messages  Per Staff messages  OV with Richardson Dopp 05/27 @ 1:30

## 2014-09-02 DIAGNOSIS — I1310 Hypertensive heart and chronic kidney disease without heart failure, with stage 1 through stage 4 chronic kidney disease, or unspecified chronic kidney disease: Secondary | ICD-10-CM | POA: Diagnosis not present

## 2014-09-02 DIAGNOSIS — N183 Chronic kidney disease, stage 3 (moderate): Secondary | ICD-10-CM | POA: Diagnosis not present

## 2014-09-02 DIAGNOSIS — I5021 Acute systolic (congestive) heart failure: Secondary | ICD-10-CM | POA: Diagnosis not present

## 2014-09-02 DIAGNOSIS — I480 Paroxysmal atrial fibrillation: Secondary | ICD-10-CM | POA: Diagnosis not present

## 2014-09-02 NOTE — Progress Notes (Addendum)
Cardiology Office Note   Date:  09/03/2014   ID:  [name], DOB [birth year], MRN [id number]  PCP:  [name] Miyamoto, MD  Cardiologist:  Dr. Fransico [name]     Chief Complaint  Patient presents with  . Congestive Heart Failure  . Atrial Fibrillation  . Cardiomyopathy     History of Present Illness: [name] is a 81 y.o. male with a hx of HTN, CAD and AS s/p CABG + AVR, diastolic dysfunction, orthostatic hypotension, PAF s/p DCCV in 2014, CKD.    Admitted 5/15-5/19 with acute combined systolic and diastolic CHF in the setting of AF with RVR.  Patient noted to have new LBBB.  He was diuresed and underwent DCCV.  He was started on Amiodarone for rhythm control.  Echo demonstrated reduced EF 30-35%.  Reduced EF likely related to tachycardia.  But, OP ischemic evaluation could be considered.  Also treated for varicella zoster during admit.    He returns for FU.  Since DC, he is doing well.  Denies any chest pain.  He has been fairly active since DC. He planted a garden yesterday.  He works in [employer] and worked 2 days this week.  He denies significant DOE.  He is NYHA 2-2b.  He denies syncope or dizziness.  Denies orthopnea, PND, edema.  No bleeding issues.     Studies/Reports Reviewed Today:  Echo 08/23/14 - Left ventricle: The cavity size was mildly dilated. Wall thickness was increased in a pattern of mild LVH. Systolic function was moderately to severely reduced. The estimated ejection fraction was in the range of 30% to 35%. Diffuse hypokinesis. - Ventricular septum: Septal motion showed paradox. These changes are consistent with intraventricular conduction delay. - Aortic valve: A bioprosthesis was present and functioning normally. Valve area (VTI): 1.1 cm^2. Valve area (Vmax): 1.1 cm^2. Valve area (Vmean): 0.99 cm^2. - Mitral valve: There was mild regurgitation. Valve area by continuity equation (using LVOT flow): 1.07 cm^2. - Left  atrium: The atrium was severely dilated. - Right ventricle: Systolic function was reduced. - Right atrium: The atrium was moderately dilated.   Past Medical History  Diagnosis Date  . Heart murmur   . Hypercholesteremia   . HTN (hypertension)   . Diastolic CHF     2D echo 0/6776 - nl LVF, mild LAE, MAC with moderately thickened MV leaflets and trivial MR, normal functioning prosthetic AVR with mild increased gradient, grade II diastolic dysfunction   . Hypotension   . Orthostatic hypotension   . CKD (chronic kidney disease)   . Aortic valve disorder 2009    severe AS s/p AVR with pericardial tissue valve  . Diabetes   . Aortic stenosis   . Barrett esophagus   . CAD (coronary artery disease)     s/p 2 vessel CABG with LIMA to LAD, SVG to diagonal 1, SVG to RCA 12/09  . Cardiomyopathy     EF now normal by echo 2012 at 51%  . H/O: rheumatic fever   . Atrial fibrillation     post op afib with no reoccurence  . Cardiomyopathy     a. Echo 5/16:  mild LVH, EF 30-35%, diff HK, AVR ok, mild MR, severe LAE, mod RAE, reduced RVSF    Past Surgical History  Procedure Laterality Date  . Coronary artery bypass graft      x 3 Dr. Roxan [name]  . Avr with pericardial tissue valve    . Intra-ocular lens os dr  [name]    . Cardioversion N/A 01/27/2013    Procedure: CARDIOVERSION;  Surgeon: [name] Margarita, MD;  Location: [facility];  Service: Cardiovascular;  Laterality: N/A;  . Cardioversion N/A 08/25/2014    Procedure: CARDIOVERSION;  Surgeon: [name] Klein, MD;  Location: [facility];  Service: Cardiovascular;  Laterality: N/A;     Current Outpatient Prescriptions  Medication Sig Dispense Refill  . amiodarone (PACERONE) 200 MG tablet Take 1 tablet (200 mg total) by mouth daily. 90 tablet 3  . amLODipine (NORVASC) 10 MG tablet Take 5 mg by mouth 2 (two) times daily.     Marland Kitchen apixaban (ELIQUIS) 5 MG TABS tablet Take 1 tablet (5 mg total) by mouth 2 (two) times daily. 180 tablet 3  .  atenolol (TENORMIN) 25 MG tablet 1 tab in the am    . atorvastatin (LIPITOR) 20 MG tablet Take 20 mg by mouth daily.    . furosemide (LASIX) 20 MG tablet Take 2 tablets (40 mg total) by mouth daily. 60 tablet 0  . lisinopril (PRINIVIL,ZESTRIL) 40 MG tablet Take 40 mg by mouth daily.    . Magnesium 250 MG TABS Take 1 tablet by mouth daily.     . Multiple Vitamin (MULTIVITAMIN) capsule Take 1 capsule by mouth daily.    Marland Kitchen omega-3 acid ethyl esters (LOVAZA) 1 G capsule Take 2 g by mouth 2 (two) times daily.    Marland Kitchen omeprazole (PRILOSEC) 20 MG capsule Take 20 mg by mouth daily.    . Potassium Gluconate 595 MG CAPS Take 1 capsule by mouth daily.    . ranitidine (ZANTAC) 300 MG tablet Take 300 mg by mouth at bedtime.    . traMADol (ULTRAM) 50 MG tablet Take 50 mg by mouth every 6 (six) hours as needed for moderate pain or severe pain (pain).    . valACYclovir (VALTREX) 1000 MG tablet Take 1 tablet (1,000 mg total) by mouth every 8 (eight) hours. 12 tablet 0   No current facility-administered medications for this visit.    Allergies:   Novocain    Social History:  The patient  reports that he has never smoked. He does not have any smokeless tobacco history on file. He reports that he does not drink alcohol or use illicit drugs.   Family History:  The patient's family history includes Alzheimer's disease in his mother and another family member.    ROS:   Please see the history of present illness.   Review of Systems  Constitution: Positive for decreased appetite.  Eyes: Positive for visual disturbance.  Cardiovascular: Positive for dyspnea on exertion and irregular heartbeat.  Skin: Positive for rash.  All other systems reviewed and are negative.    PHYSICAL EXAM: VS:  BP 118/70 mmHg  Pulse 62  Ht 6' (1.829 m)  Wt 211 lb 6.4 oz (95.89 kg)  BMI 28.66 kg/m2    Wt Readings from Last 3 Encounters:  09/03/14 211 lb 6.4 oz (95.89 kg)  08/26/14 208 lb 5.4 oz (94.5 kg)  02/16/13 214 lb (97.07  kg)     GEN: Well nourished, well developed, in no acute distress HEENT: normal Neck: no JVD,  no masses  Cardiac:  Normal S1/S2, RRR; no murmur ,  no rubs or gallops, trace bilateral LE edema   Respiratory:  clear to auscultation bilaterally, no wheezing, rhonchi or rales. GI: soft, nontender, nondistended, + BS MS: no deformity or atrophy Skin: warm and dry  Neuro:  CNs II-XII intact, Strength and sensation are  intact Psych: Normal affect   EKG:  EKG is ordered today.  It demonstrates:   Sinus brady, HR 51, 1st degree AVB, LBBB   Recent Labs: 08/22/2014: ALT 22; B Natriuretic Peptide 592.3* 08/23/2014: Hemoglobin 13.7; Magnesium 2.2; Platelets 188; TSH 1.055 08/26/2014: BUN 37*; Creatinine 1.72*; Potassium 4.2; Sodium 138    Lipid Panel    Component Value Date/Time   CHOL 93* 06/16/2014 0914   TRIG 124 06/16/2014 0914   HDL 34* 06/16/2014 0914   LDLCALC 34 06/16/2014 0914      ASSESSMENT AND PLAN:  Paroxysmal atrial fibrillation:  Maintaining NSR.  Continue Amiodarone.  Reduce dose to 400 mg QD x 1 week then reduced to 200 mg QD.  HR is s/w slow.  He is asymptomatic. This should improve with cutting back on Amiodarone. Obtain PFTs with DLCO for baseline.  We discussed the importance of annual eye exams.  Plan TSH and LFTs in 6 weeks.  Continue Apixaban. In August, he will be 82.  Creatinine has been > 1.5. Will need to consider decreasing dose to 2.5 mg Twice daily at that time.     Chronic combined systolic and diastolic heart failure:  Volume stable.  Check BMET today.  Continue current dose of Lasix.   Dilated Cardiomyopathy:  May be related to tachycardia.  Plan repeat Echo in 4-6 weeks.  If EF remains down, consider stress testing.  He would be high risk for CIN if he needed a cath due CKD.  He has not had any symptoms of angina.  Would try to avoid LHC if possible.  Continue beta-blocker, ACE inhibitor.     Coronary artery disease involving native coronary artery of  native heart without angina pectoris:  No angina.  Continue beta-blocker, ACE inhibitor, statin.  Consider stress testing if EF remains down at FU.  CKD (chronic kidney disease) stage 3, GFR 30-59 ml/min:  Check BMET.     Essential hypertension:  Controlled.   Dyslipidemia:   Continue statin.    S/P AVR (aortic valve replacement):  Continue SBE prophylaxis.  Recent echo with stable AVR.       Current medicines are reviewed at length with the patient today.  Concerns regarding medicines are as outlined above.  The following changes have been made:    Decrease Amiodarone to 400 mg QD x 1 week, then reduced to 200 mg QD.    Labs/ tests ordered today include:  Orders Placed This Encounter  Procedures  . Basic Metabolic Panel (BMET)  . TSH  . Hepatic function panel  . EKG 12-Lead  . Echocardiogram  . Pulmonary function test    Disposition:   FU with Dr. Fransico [name] after Echo done in 4-6 weeks.    Signed, Versie Starks, MHS 09/03/2014 1:14 PM    [facility] [address], [place], [name]  [address] Phone: [phone]; Fax: [phone]    ADDENDUM 09/03/2014 1:15 PM I received a call from his PCP (FRIED, ROBERT L, MD), after he left the office. Labs from yesterday demonstrated K+ 5.2, Creatinine 2.13, BUN 36, Na 130.  I will have [name] hold his ACE inhibitor x 1 day and resume.  He will hold his Lasix x 1 day and resume at 20 mg QD.  He will stop his K+.  I will get a repeat BMET Tuesday 5/31. Richardson Dopp, PA-C   09/03/2014 1:16 PM

## 2014-09-03 ENCOUNTER — Ambulatory Visit (INDEPENDENT_AMBULATORY_CARE_PROVIDER_SITE_OTHER): Payer: Commercial Managed Care - HMO | Admitting: Physician Assistant

## 2014-09-03 ENCOUNTER — Telehealth: Payer: Self-pay | Admitting: *Deleted

## 2014-09-03 ENCOUNTER — Encounter: Payer: Self-pay | Admitting: Physician Assistant

## 2014-09-03 VITALS — BP 118/70 | HR 62 | Ht 72.0 in | Wt 211.4 lb

## 2014-09-03 DIAGNOSIS — I480 Paroxysmal atrial fibrillation: Secondary | ICD-10-CM

## 2014-09-03 DIAGNOSIS — N183 Chronic kidney disease, stage 3 unspecified: Secondary | ICD-10-CM

## 2014-09-03 DIAGNOSIS — E785 Hyperlipidemia, unspecified: Secondary | ICD-10-CM

## 2014-09-03 DIAGNOSIS — I5042 Chronic combined systolic (congestive) and diastolic (congestive) heart failure: Secondary | ICD-10-CM | POA: Diagnosis not present

## 2014-09-03 DIAGNOSIS — Z954 Presence of other heart-valve replacement: Secondary | ICD-10-CM

## 2014-09-03 DIAGNOSIS — Z952 Presence of prosthetic heart valve: Secondary | ICD-10-CM

## 2014-09-03 DIAGNOSIS — I2510 Atherosclerotic heart disease of native coronary artery without angina pectoris: Secondary | ICD-10-CM | POA: Diagnosis not present

## 2014-09-03 DIAGNOSIS — I420 Dilated cardiomyopathy: Secondary | ICD-10-CM

## 2014-09-03 DIAGNOSIS — I10 Essential (primary) hypertension: Secondary | ICD-10-CM

## 2014-09-03 DIAGNOSIS — Z79899 Other long term (current) drug therapy: Secondary | ICD-10-CM

## 2014-09-03 LAB — BASIC METABOLIC PANEL
BUN: 30 mg/dL — ABNORMAL HIGH (ref 6–23)
CO2: 26 mEq/L (ref 19–32)
Calcium: 8.9 mg/dL (ref 8.4–10.5)
Chloride: 101 mEq/L (ref 96–112)
Creatinine, Ser: 1.98 mg/dL — ABNORMAL HIGH (ref 0.40–1.50)
GFR: 34.76 mL/min — ABNORMAL LOW (ref >60.00)
Glucose, Bld: 128 mg/dL — ABNORMAL HIGH (ref 70–99)
Potassium: 4.5 mEq/L (ref 3.5–5.1)
SODIUM: 133 meq/L — AB (ref 135–145)

## 2014-09-03 MED ORDER — FUROSEMIDE 20 MG PO TABS: 20.0000 mg | ORAL_TABLET | Freq: Every day | ORAL | Status: DC

## 2014-09-03 MED ORDER — AMIODARONE HCL 400 MG PO TABS: 400.0000 mg | ORAL_TABLET | ORAL | Status: DC

## 2014-09-03 MED ORDER — AMIODARONE HCL 200 MG PO TABS: 200.0000 mg | ORAL_TABLET | Freq: Every day | ORAL | Status: DC

## 2014-09-03 NOTE — Patient Instructions (Signed)
Medication Instructions:  1. DECREASE AMIODARONE TO 400 MG DAILY FOR 1 WEEK THEN DECREASE TO 200 MG DAILY (1/2 TAB)  Labwork: 1. TODAY BMET  2. 4-6 WEEKS TSH, LFT  Testing/Procedures: 1. Your physician has requested that you have an echocardiogram THIS WILL NEED TO BE DONE IN 4-6 WEEKS BEFORE APPT WITH DR. Radford Pax. Echocardiography is a painless test that uses sound waves to create images of your heart. It provides your doctor with information about the size and shape of your heart and how well your heart's chambers and valves are working. This procedure takes approximately one hour. There are no restrictions for this procedure.  2. Your physician has recommended that you have a pulmonary function test AT [facility]. Pulmonary Function Tests are a group of tests that measure how well air moves in and out of your lungs.   Follow-Up: 4-6 WEEKS WITH DR. Radford Pax ONCE YOUR ECHO HAS BEEN COMPLETED; 10/18/14 @ 10 AM, 11AM OR 3:45 PM  Any Other Special Instructions Will Be Listed Below (If Applicable).

## 2014-09-03 NOTE — Telephone Encounter (Signed)
lmptcb per Dr. Maceo Pro who s/w Brynda Rim. PA today to have pt d/c K+, hold lisinopril x 1 day, hold lasix x 1 day; resume lisinopril; resume lasix at 20 mg daily w/bmet 5/31.

## 2014-09-03 NOTE — Telephone Encounter (Signed)
Pt notified to d/c K+, hold lisinopril and lasix x 1 day then resume ; lasix 20 mg  daily and lisinopril 40 mg daily; bmet 5/31. pt agreeable to plan of care with verbal read back to instructions.

## 2014-09-07 ENCOUNTER — Other Ambulatory Visit (INDEPENDENT_AMBULATORY_CARE_PROVIDER_SITE_OTHER): Payer: Commercial Managed Care - HMO | Admitting: *Deleted

## 2014-09-07 DIAGNOSIS — N183 Chronic kidney disease, stage 3 unspecified: Secondary | ICD-10-CM

## 2014-09-07 DIAGNOSIS — Z79899 Other long term (current) drug therapy: Secondary | ICD-10-CM

## 2014-09-07 DIAGNOSIS — I10 Essential (primary) hypertension: Secondary | ICD-10-CM | POA: Diagnosis not present

## 2014-09-07 DIAGNOSIS — I5042 Chronic combined systolic (congestive) and diastolic (congestive) heart failure: Secondary | ICD-10-CM

## 2014-09-07 DIAGNOSIS — E785 Hyperlipidemia, unspecified: Secondary | ICD-10-CM | POA: Diagnosis not present

## 2014-09-07 LAB — BASIC METABOLIC PANEL
BUN: 23 mg/dL (ref 6–23)
CO2: 23 mEq/L (ref 19–32)
CREATININE: 2.04 mg/dL — AB (ref 0.40–1.50)
Calcium: 9 mg/dL (ref 8.4–10.5)
Chloride: 103 mEq/L (ref 96–112)
GFR: 33.58 mL/min — AB (ref >60.00)
Glucose, Bld: 123 mg/dL — ABNORMAL HIGH (ref 70–99)
POTASSIUM: 4.4 meq/L (ref 3.5–5.1)
Sodium: 134 mEq/L — ABNORMAL LOW (ref 135–145)

## 2014-09-07 LAB — HEPATIC FUNCTION PANEL
ALBUMIN: 4.2 g/dL (ref 3.5–5.2)
ALK PHOS: 44 U/L (ref 39–117)
ALT: 22 U/L (ref 0–53)
AST: 21 U/L (ref 0–37)
Bilirubin, Direct: 0.1 mg/dL (ref 0.0–0.3)
Total Bilirubin: 0.6 mg/dL (ref 0.2–1.2)
Total Protein: 7 g/dL (ref 6.0–8.3)

## 2014-09-07 LAB — TSH: TSH: 1.95 u[IU]/mL (ref 0.35–4.50)

## 2014-09-07 NOTE — Addendum Note (Signed)
Addended by: Eulis Foster on: 09/07/2014 08:49 AM   Modules accepted: Orders

## 2014-09-08 ENCOUNTER — Telehealth: Payer: Self-pay | Admitting: *Deleted

## 2014-09-08 DIAGNOSIS — E785 Hyperlipidemia, unspecified: Secondary | ICD-10-CM

## 2014-09-08 DIAGNOSIS — I10 Essential (primary) hypertension: Secondary | ICD-10-CM

## 2014-09-08 DIAGNOSIS — I5042 Chronic combined systolic (congestive) and diastolic (congestive) heart failure: Secondary | ICD-10-CM

## 2014-09-08 NOTE — Telephone Encounter (Signed)
Pt notified of lab reuslts and plan of care to stop lasix,; hold lisinopril x 3 days then resume lisinopril, bmet 1 week.

## 2014-09-13 ENCOUNTER — Ambulatory Visit (HOSPITAL_COMMUNITY): Payer: Commercial Managed Care - HMO

## 2014-09-13 ENCOUNTER — Other Ambulatory Visit (INDEPENDENT_AMBULATORY_CARE_PROVIDER_SITE_OTHER): Payer: Commercial Managed Care - HMO | Admitting: *Deleted

## 2014-09-13 ENCOUNTER — Other Ambulatory Visit: Payer: Self-pay | Admitting: Physician Assistant

## 2014-09-13 ENCOUNTER — Encounter: Payer: Self-pay | Admitting: Physician Assistant

## 2014-09-13 ENCOUNTER — Telehealth: Payer: Self-pay | Admitting: *Deleted

## 2014-09-13 ENCOUNTER — Ambulatory Visit (HOSPITAL_COMMUNITY)
Admission: RE | Admit: 2014-09-13 | Discharge: 2014-09-13 | Disposition: A | Discharge status: Home / Self Care | Payer: Commercial Managed Care - HMO | Source: Ambulatory Visit | Attending: Physician Assistant | Admitting: Physician Assistant

## 2014-09-13 DIAGNOSIS — I420 Dilated cardiomyopathy: Secondary | ICD-10-CM

## 2014-09-13 DIAGNOSIS — Z79899 Other long term (current) drug therapy: Secondary | ICD-10-CM

## 2014-09-13 DIAGNOSIS — I5042 Chronic combined systolic (congestive) and diastolic (congestive) heart failure: Secondary | ICD-10-CM | POA: Diagnosis not present

## 2014-09-13 DIAGNOSIS — Z952 Presence of prosthetic heart valve: Secondary | ICD-10-CM

## 2014-09-13 DIAGNOSIS — Z5181 Encounter for therapeutic drug level monitoring: Secondary | ICD-10-CM | POA: Insufficient documentation

## 2014-09-13 DIAGNOSIS — I10 Essential (primary) hypertension: Secondary | ICD-10-CM

## 2014-09-13 DIAGNOSIS — E785 Hyperlipidemia, unspecified: Secondary | ICD-10-CM | POA: Diagnosis not present

## 2014-09-13 LAB — PULMONARY FUNCTION TEST
DL/VA % pred: 75 %
DL/VA: 3.55 ml/min/mmHg/L
DLCO UNC % PRED: 66 %
DLCO unc: 23.14 ml/min/mmHg
FEF 25-75 POST: 1.64 L/s
FEF 25-75 Pre: 1.03 L/sec
FEF2575-%Change-Post: 59 %
FEF2575-%Pred-Post: 75 %
FEF2575-%Pred-Pre: 47 %
FEV1-%Change-Post: 9 %
FEV1-%Pred-Post: 79 %
FEV1-%Pred-Pre: 72 %
FEV1-Post: 2.49 L
FEV1-Pre: 2.27 L
FEV1FVC-%Change-Post: -6 %
FEV1FVC-%Pred-Pre: 87 %
FEV6-%CHANGE-POST: 10 %
FEV6-%Pred-Post: 94 %
FEV6-%Pred-Pre: 85 %
FEV6-PRE: 3.5 L
FEV6-Post: 3.87 L
FEV6FVC-%Change-Post: -5 %
FEV6FVC-%Pred-Post: 97 %
FEV6FVC-%Pred-Pre: 103 %
FVC-%Change-Post: 17 %
FVC-%PRED-PRE: 82 %
FVC-%Pred-Post: 96 %
FVC-POST: 4.22 L
FVC-Pre: 3.61 L
POST FEV1/FVC RATIO: 59 %
PRE FEV6/FVC RATIO: 97 %
Post FEV6/FVC ratio: 92 %
Pre FEV1/FVC ratio: 63 %
RV % PRED: 121 %
RV: 3.34 L
TLC % PRED: 102 %
TLC: 7.67 L

## 2014-09-13 LAB — TSH: TSH: 1.85 u[IU]/mL (ref 0.35–4.50)

## 2014-09-13 LAB — HEPATIC FUNCTION PANEL
ALT: 29 U/L (ref 0–53)
AST: 23 U/L (ref 0–37)
Albumin: 4.2 g/dL (ref 3.5–5.2)
Alkaline Phosphatase: 48 U/L (ref 39–117)
BILIRUBIN DIRECT: 0.2 mg/dL (ref 0.0–0.3)
BILIRUBIN TOTAL: 0.6 mg/dL (ref 0.2–1.2)
Total Protein: 7 g/dL (ref 6.0–8.3)

## 2014-09-13 LAB — BASIC METABOLIC PANEL
BUN: 22 mg/dL (ref 6–23)
CALCIUM: 9.3 mg/dL (ref 8.4–10.5)
CO2: 24 mEq/L (ref 19–32)
CREATININE: 1.78 mg/dL — AB (ref 0.40–1.50)
Chloride: 102 mEq/L (ref 96–112)
GFR: 39.3 mL/min — ABNORMAL LOW (ref >60.00)
Glucose, Bld: 114 mg/dL — ABNORMAL HIGH (ref 70–99)
POTASSIUM: 4.4 meq/L (ref 3.5–5.1)
SODIUM: 134 meq/L — AB (ref 135–145)

## 2014-09-13 MED ORDER — ALBUTEROL SULFATE (2.5 MG/3ML) 0.083% IN NEBU
2.5000 mg | INHALATION_SOLUTION | Freq: Once | RESPIRATORY_TRACT | Status: AC
  Administered 2014-09-13: 2.5 mg via RESPIRATORY_TRACT

## 2014-09-13 NOTE — Telephone Encounter (Signed)
Pt's wife states pt not home, I then saw pt was here having his echo and lab work done. Explained to wife that I was calling with PFT results. I stated I will cb later and s/w pt. Wife said thank you.

## 2014-09-13 NOTE — Addendum Note (Signed)
Addended by: Eulis Foster on: 09/13/2014 09:18 AM   Modules accepted: Orders

## 2014-09-13 NOTE — Telephone Encounter (Signed)
Pt notified of PFT results by phone with verbal understanding. Pt advised if new onset of SOB or if he has wheezing, coughing then to please f/u w/PCP. Pt aqreeable to plan of care. I will fax results to PCP.

## 2014-09-16 ENCOUNTER — Telehealth: Payer: Self-pay | Admitting: *Deleted

## 2014-09-16 DIAGNOSIS — I10 Essential (primary) hypertension: Secondary | ICD-10-CM

## 2014-09-16 DIAGNOSIS — I480 Paroxysmal atrial fibrillation: Secondary | ICD-10-CM

## 2014-09-16 NOTE — Telephone Encounter (Signed)
pt notified of lab results. I stated that we will credit pt for tsh, lft that was done too soon. Pt will have these done 7/7 when he has his echo. Pt states [employer] called him and said they have been trying to reach our office but could not reach [place]. I called [employer] on behalf of pt and clarified Rx for the Amiodarone pt is currently taking.

## 2014-10-05 DIAGNOSIS — H16223 Keratoconjunctivitis sicca, not specified as Sjogren's, bilateral: Secondary | ICD-10-CM | POA: Diagnosis not present

## 2014-10-05 DIAGNOSIS — Z961 Presence of intraocular lens: Secondary | ICD-10-CM | POA: Diagnosis not present

## 2014-10-05 DIAGNOSIS — H43813 Vitreous degeneration, bilateral: Secondary | ICD-10-CM | POA: Diagnosis not present

## 2014-10-14 ENCOUNTER — Other Ambulatory Visit: Payer: Self-pay | Admitting: Physician Assistant

## 2014-10-14 ENCOUNTER — Other Ambulatory Visit (INDEPENDENT_AMBULATORY_CARE_PROVIDER_SITE_OTHER): Payer: Commercial Managed Care - HMO | Admitting: *Deleted

## 2014-10-14 ENCOUNTER — Ambulatory Visit (HOSPITAL_COMMUNITY): Payer: Commercial Managed Care - HMO | Attending: Cardiovascular Disease

## 2014-10-14 ENCOUNTER — Other Ambulatory Visit: Payer: Self-pay

## 2014-10-14 DIAGNOSIS — I420 Dilated cardiomyopathy: Secondary | ICD-10-CM

## 2014-10-14 DIAGNOSIS — Z952 Presence of prosthetic heart valve: Secondary | ICD-10-CM | POA: Insufficient documentation

## 2014-10-14 DIAGNOSIS — I480 Paroxysmal atrial fibrillation: Secondary | ICD-10-CM | POA: Diagnosis not present

## 2014-10-14 DIAGNOSIS — Z954 Presence of other heart-valve replacement: Secondary | ICD-10-CM

## 2014-10-14 DIAGNOSIS — I10 Essential (primary) hypertension: Secondary | ICD-10-CM | POA: Diagnosis not present

## 2014-10-14 DIAGNOSIS — I059 Rheumatic mitral valve disease, unspecified: Secondary | ICD-10-CM | POA: Insufficient documentation

## 2014-10-14 DIAGNOSIS — I517 Cardiomegaly: Secondary | ICD-10-CM | POA: Diagnosis not present

## 2014-10-14 LAB — HEPATIC FUNCTION PANEL
ALBUMIN: 3.9 g/dL (ref 3.5–5.2)
ALT: 24 U/L (ref 0–53)
AST: 24 U/L (ref 0–37)
Alkaline Phosphatase: 40 U/L (ref 39–117)
Bilirubin, Direct: 0.1 mg/dL (ref 0.0–0.3)
Total Bilirubin: 0.7 mg/dL (ref 0.2–1.2)
Total Protein: 6.6 g/dL (ref 6.0–8.3)

## 2014-10-14 LAB — TSH: TSH: 1.85 u[IU]/mL (ref 0.35–4.50)

## 2014-10-15 ENCOUNTER — Telehealth: Payer: Self-pay | Admitting: *Deleted

## 2014-10-15 NOTE — Telephone Encounter (Signed)
Pt has been notified of lab results by phone with verbal understanding. Advised pt to keep appt with Dr. Radford Pax 7/11 10 am pt said ok and thank you.

## 2014-10-18 ENCOUNTER — Encounter: Payer: Self-pay | Admitting: *Deleted

## 2014-10-18 ENCOUNTER — Other Ambulatory Visit: Payer: Self-pay | Admitting: *Deleted

## 2014-10-18 ENCOUNTER — Ambulatory Visit (INDEPENDENT_AMBULATORY_CARE_PROVIDER_SITE_OTHER): Payer: Commercial Managed Care - HMO | Admitting: Cardiology

## 2014-10-18 ENCOUNTER — Encounter: Payer: Self-pay | Admitting: Cardiology

## 2014-10-18 VITALS — BP 134/60 | HR 81 | Ht 72.0 in | Wt 210.4 lb

## 2014-10-18 DIAGNOSIS — I480 Paroxysmal atrial fibrillation: Secondary | ICD-10-CM

## 2014-10-18 DIAGNOSIS — E785 Hyperlipidemia, unspecified: Secondary | ICD-10-CM

## 2014-10-18 DIAGNOSIS — I5042 Chronic combined systolic (congestive) and diastolic (congestive) heart failure: Secondary | ICD-10-CM

## 2014-10-18 DIAGNOSIS — I350 Nonrheumatic aortic (valve) stenosis: Secondary | ICD-10-CM

## 2014-10-18 DIAGNOSIS — I10 Essential (primary) hypertension: Secondary | ICD-10-CM

## 2014-10-18 DIAGNOSIS — I2510 Atherosclerotic heart disease of native coronary artery without angina pectoris: Secondary | ICD-10-CM | POA: Diagnosis not present

## 2014-10-18 DIAGNOSIS — I2583 Coronary atherosclerosis due to lipid rich plaque: Principal | ICD-10-CM

## 2014-10-18 MED ORDER — HYDRALAZINE HCL 10 MG PO TABS: 10.0000 mg | ORAL_TABLET | Freq: Three times a day (TID) | ORAL | Status: DC

## 2014-10-18 MED ORDER — CARVEDILOL 6.25 MG PO TABS: 6.2500 mg | ORAL_TABLET | Freq: Two times a day (BID) | ORAL | Status: DC

## 2014-10-18 MED ORDER — FUROSEMIDE 20 MG PO TABS: 20.0000 mg | ORAL_TABLET | Freq: Every day | ORAL | Status: DC

## 2014-10-18 MED ORDER — APIXABAN 2.5 MG PO TABS: 2.5000 mg | ORAL_TABLET | Freq: Two times a day (BID) | ORAL | Status: DC

## 2014-10-18 MED ORDER — ISOSORBIDE MONONITRATE ER 30 MG PO TB24: 30.0000 mg | ORAL_TABLET | Freq: Every day | ORAL | Status: DC

## 2014-10-18 MED ORDER — CARVEDILOL 6.25 MG PO TABS
6.2500 mg | ORAL_TABLET | Freq: Two times a day (BID) | ORAL | Status: DC
Start: 2014-10-18 — End: 2014-10-18

## 2014-10-18 NOTE — Patient Instructions (Addendum)
Medication Instructions:  Decrease Eliquis (apixaban) to 2.72m two times a day. You can take 1/2 of your 54mtablet two times a day and use your current supply.  Stop atenolol. Stop lisinopril. Start coreg (carverdilol) 6.256mwo times a day. Start lasix (furosemide) 89m45mily.  Start Imdur (isosorbide) 30mg2mly. Start hydralazine 10mg 26me times a day.  Labwork: Your physician recommends that you return for lab work next week when you see Dr TurnerEna Dawleysting/Procedures: Your physician has requested that you have a lexiscan myoview. For further information please visit [email or web address] follow instruction sheet, as given.   Follow-Up: Your physician recommends that you schedule a follow-up appointment next week with Dr TurnerRadford Paxny Other Special Instructions Will Be Listed Below (If Applicable). Weigh every day at the same time of day with the same amount of clothes.  Usually first thing in the morning after you go to the bathroom and urinate and before you eat is a good time to weigh. Write down these weights and bring the record of your weights to your visit with Dr TurnerRadford Paxweek. Call our office if you gain more than 3 pounds in 1 day or 5 pounds in 1 week.

## 2014-10-18 NOTE — Progress Notes (Addendum)
                Cardiology Office Note   Date:  10/18/2014   ID:  [name], DOB 03/22/1933, MRN [id number]  PCP:  FRIED, ROBERT L, MD    Chief Complaint  Patient presents with  . Follow-up    atrial fib      History of Present Illness: [name] is a 81 y.o. male with a history of HTN, AS s/p AVR, chronic combined systolic/diastolic CHF in setting of Afib with RVR, orthostatic hypotension, ASCAD s/p CABG and PAF. He now presents back for followup.  He was admitted with acute CHF in setting of afib with RVR in May and underwent DCCV.  2D echo showed EF 30-35% and was felt to be tachycardia driven. He was started on Amio.    He denies any dizziness, palpitations or syncope. He denies any chest pain,  LE edema.He has some DOE with extreme exertion.  He has noticed increased LE edema in the past few days that resolves at night.   Recent 2D echo showed persistently reduced LVF with EF 25-30% with inferior and apical severe HK and diffuse HK.      Past Medical History  Diagnosis Date  . Heart murmur   . Hypercholesteremia   . HTN (hypertension)   . Diastolic CHF     2D echo 12/2010 - nl LVF, mild LAE, MAC with moderately thickened MV leaflets and trivial MR, normal functioning prosthetic AVR with mild increased gradient, grade II diastolic dysfunction   . Hypotension   . Orthostatic hypotension   . CKD (chronic kidney disease)   . Aortic valve disorder 2009    severe AS s/p AVR with pericardial tissue valve  . Diabetes   . Aortic stenosis   . Barrett esophagus   . CAD (coronary artery disease)     s/p 2 vessel CABG with LIMA to LAD, SVG to diagonal 1, SVG to RCA 12/09  . Cardiomyopathy   . H/O: rheumatic fever   . Atrial fibrillation     post op afib with no reoccurence  . Cardiomyopathy     a. Echo 5/16:  mild LVH, EF 30-35%, diff HK, AVR ok, mild MR, severe LAE, mod RAE, reduced RVSF.  Repeat limited echo 10/2014 with EF 25-30%  . History of PFTs     a.  Amiodarone started 5/16 >> PFTs w/ DLCO 6/16:  FEV1 72% predicted, FEV1/FVC 66%, DLCO 66% >> minimal reversible obstructive airways disease with mild diffusion defect (suggestive of emphysema but absence of hyperinflation inconsistent with dx)    Past Surgical History  Procedure Laterality Date  . Coronary artery bypass graft      x 3 Dr. Hendrickson  . Avr with pericardial tissue valve    . Intra-ocular lens os dr hagaman    . Cardioversion N/A 01/27/2013    Procedure: CARDIOVERSION;  Surgeon: Farra Nikolic R Decklin Weddington, MD;  Location: [facility];  Service: Cardiovascular;  Laterality: N/A;  . Cardioversion N/A 08/25/2014    Procedure: CARDIOVERSION;  Surgeon: Mihai Croitoru, MD;  Location: [facility];  Service: Cardiovascular;  Laterality: N/A;     Current Outpatient Prescriptions  Medication Sig Dispense Refill  . acetaminophen (TYLENOL) 500 MG tablet Take 500 mg by mouth every 6 (six) hours as needed for moderate pain.    . amiodarone (PACERONE) 200 MG tablet Take 1 tablet (200 mg total) by mouth daily. 90 tablet 3  . amLODipine (NORVASC) 10 MG   tablet Take 5 mg by mouth 2 (two) times daily.     . apixaban (ELIQUIS) 5 MG TABS tablet Take 1 tablet (5 mg total) by mouth 2 (two) times daily. 180 tablet 3  . atenolol (TENORMIN) 25 MG tablet 1 tab in the am    . atorvastatin (LIPITOR) 20 MG tablet Take 20 mg by mouth daily.    . lisinopril (PRINIVIL,ZESTRIL) 40 MG tablet Take 40 mg by mouth daily.    . Magnesium 250 MG TABS Take 1 tablet by mouth daily.     . Multiple Vitamin (MULTIVITAMIN) capsule Take 1 capsule by mouth daily.    . omega-3 acid ethyl esters (LOVAZA) 1 G capsule Take 2 g by mouth 2 (two) times daily.    . omeprazole (PRILOSEC) 20 MG capsule Take 20 mg by mouth daily.    . ranitidine (ZANTAC) 300 MG tablet Take 300 mg by mouth at bedtime.    . traMADol (ULTRAM) 50 MG tablet Take 50 mg by mouth every 6 (six) hours as needed for moderate pain or severe pain (pain).    .  valACYclovir (VALTREX) 1000 MG tablet Take 1 tablet (1,000 mg total) by mouth every 8 (eight) hours. 12 tablet 0   No current facility-administered medications for this visit.    Allergies:   Novocain    Social History:  The patient  reports that he has never smoked. He does not have any smokeless tobacco history on file. He reports that he does not drink alcohol or use illicit drugs.   Family History:  The patient's family history includes Alzheimer's disease in his mother and another family member.    ROS:  Please see the history of present illness.   Otherwise, review of systems are positive for none.   All other systems are reviewed and negative.    PHYSICAL EXAM: VS:  BP 134/60 mmHg  Pulse 81  Ht 6' (1.829 m)  Wt 210 lb 6.4 oz (95.437 kg)  BMI 28.53 kg/m2  SpO2 96% , BMI Body mass index is 28.53 kg/(m^2). GEN: Well nourished, well developed, in no acute distress HEENT: normal Neck: no JVD, carotid bruits, or masses Cardiac: RRR; no murmurs, rubs, or gallops.  1-2+ edema of LE  Respiratory:  clear to auscultation bilaterally, normal work of breathing GI: soft, nontender, nondistended, + BS MS: no deformity or atrophy Skin: warm and dry, no rash Neuro:  Strength and sensation are intact Psych: euthymic mood, full affect   EKG:  EKG is not ordered today.    Recent Labs: 08/22/2014: B Natriuretic Peptide 592.3* 08/23/2014: Hemoglobin 13.7; Magnesium 2.2; Platelets 188 09/13/2014: BUN 22; Creatinine, Ser 1.78*; Potassium 4.4; Sodium 134* 10/14/2014: ALT 24; TSH 1.85    Lipid Panel    Component Value Date/Time   CHOL 93* 06/16/2014 0914   TRIG 124 06/16/2014 0914   HDL 34* 06/16/2014 0914   LDLCALC 34 06/16/2014 0914      Wt Readings from Last 3 Encounters:  10/18/14 210 lb 6.4 oz (95.437 kg)  09/03/14 211 lb 6.4 oz (95.89 kg)  08/26/14 208 lb 5.4 oz (94.5 kg)     ASSESSMENT AND PLAN:  1. Paroxysmal atrial fibrillation s/p  DCCV and maintaining NS  -  continue apixaban/BB/amio  - decrease Apixaban to 2.5mg BID. He is turning 82 in 1 month and creatinine is > 1.5 2. HTN - controlled - continue amlodipine/BB  - stop ACE I secondary to CKD  - Add hydralazine 10mg TID 3.   Severe AS s/p prosthetic AVR 4. Chronic systemic anticoagulation 5. Chronic combined systolic/diastolic CHF - he has been having increased LE edema.  His Lasix was stopped recently due to worsening renal function.  He needs to be on diuretics so will stop his ACE I and replace with Imdur/Hydralazine and start lasix.  He will need a repeat echo in 3 months on medical therapy to assess if LVF has improved.  I will get a Lexiscan myoview to rule out ischemia from graft failure given sudden decline in EF that did not improve with restoration of NSR. - restart Lasix 20mg daily  - change Atenolol to Coreg 6.25mg BID  - stop ACE I secondary to CKD  - add hydralazine 10mg TID and Imdur 30mg daily 6. ASCAD s/p CABG 7.  Dyslipidemia - continue lipitor.  LDL at goal   Current medicines are reviewed at length with the patient today.  The patient does not have concerns regarding medicines.  The following changes have been made:  Stop Lisinopril, stop atenolol, start coreg 6.25mg BID, start Lasix 20mg daily, start hydralazine 10mg TID, start Imdur 30mg daily, decrease eliquis to 2.5mg BID  Labs/ tests ordered today: See above Assessment and Plan No orders of the defined types were placed in this encounter.     Disposition:   FU with me in 1 week  Signed, Makara Lanzo R, MD  10/18/2014 9:35 AM    [facility] [address], [place], [place]  [address] Phone: [phone]; Fax: [phone]    

## 2014-10-20 ENCOUNTER — Telehealth (HOSPITAL_COMMUNITY): Payer: Self-pay | Admitting: *Deleted

## 2014-10-20 NOTE — Telephone Encounter (Signed)
Left message on voicemail in reference to upcoming appointment scheduled for 10/25/14. Phone number given for a call back so details instructions can be given. Imre Vecchione J Deboraha Goar, RN 

## 2014-10-21 ENCOUNTER — Telehealth (HOSPITAL_COMMUNITY): Payer: Self-pay | Admitting: *Deleted

## 2014-10-21 NOTE — Telephone Encounter (Signed)
Left message on voicemail in reference to upcoming appointment scheduled for 10/25/14. Phone number given for a call back so details instructions can be given. Hubbard Robinson, RN

## 2014-10-25 ENCOUNTER — Ambulatory Visit (HOSPITAL_COMMUNITY): Payer: Commercial Managed Care - HMO | Attending: Cardiology

## 2014-10-25 DIAGNOSIS — I2510 Atherosclerotic heart disease of native coronary artery without angina pectoris: Secondary | ICD-10-CM | POA: Diagnosis not present

## 2014-10-25 DIAGNOSIS — I10 Essential (primary) hypertension: Secondary | ICD-10-CM | POA: Insufficient documentation

## 2014-10-25 DIAGNOSIS — I5042 Chronic combined systolic (congestive) and diastolic (congestive) heart failure: Secondary | ICD-10-CM | POA: Insufficient documentation

## 2014-10-25 DIAGNOSIS — I350 Nonrheumatic aortic (valve) stenosis: Secondary | ICD-10-CM | POA: Diagnosis not present

## 2014-10-25 DIAGNOSIS — I447 Left bundle-branch block, unspecified: Secondary | ICD-10-CM | POA: Insufficient documentation

## 2014-10-25 DIAGNOSIS — R0609 Other forms of dyspnea: Secondary | ICD-10-CM | POA: Diagnosis not present

## 2014-10-25 DIAGNOSIS — I2583 Coronary atherosclerosis due to lipid rich plaque: Secondary | ICD-10-CM

## 2014-10-25 DIAGNOSIS — I480 Paroxysmal atrial fibrillation: Secondary | ICD-10-CM

## 2014-10-25 DIAGNOSIS — E785 Hyperlipidemia, unspecified: Secondary | ICD-10-CM

## 2014-10-25 DIAGNOSIS — R9439 Abnormal result of other cardiovascular function study: Secondary | ICD-10-CM | POA: Diagnosis not present

## 2014-10-25 LAB — MYOCARDIAL PERFUSION IMAGING
CHL CUP NUCLEAR SDS: 5
LV dias vol: 228 mL
LV sys vol: 152 mL
Peak HR: 82 {beats}/min
RATE: 0.29
Rest HR: 68 {beats}/min
SRS: 10
SSS: 15
TID: 0.98

## 2014-10-25 MED ORDER — REGADENOSON 0.4 MG/5ML IV SOLN
0.4000 mg | Freq: Once | INTRAVENOUS | Status: AC
  Administered 2014-10-25: 0.4 mg via INTRAVENOUS

## 2014-10-25 MED ORDER — TECHNETIUM TC 99M SESTAMIBI GENERIC - CARDIOLITE
11.0000 | Freq: Once | INTRAVENOUS | Status: AC | PRN
  Administered 2014-10-25: 11 via INTRAVENOUS

## 2014-10-25 MED ORDER — TECHNETIUM TC 99M SESTAMIBI GENERIC - CARDIOLITE
33.0000 | Freq: Once | INTRAVENOUS | Status: AC | PRN
  Administered 2014-10-25: 33 via INTRAVENOUS

## 2014-10-27 ENCOUNTER — Other Ambulatory Visit: Payer: Self-pay | Admitting: Cardiology

## 2014-10-27 ENCOUNTER — Other Ambulatory Visit: Payer: Self-pay

## 2014-10-27 ENCOUNTER — Telehealth: Payer: Self-pay

## 2014-10-27 DIAGNOSIS — R9439 Abnormal result of other cardiovascular function study: Secondary | ICD-10-CM

## 2014-10-27 DIAGNOSIS — I420 Dilated cardiomyopathy: Secondary | ICD-10-CM

## 2014-10-27 DIAGNOSIS — Z01812 Encounter for preprocedural laboratory examination: Secondary | ICD-10-CM

## 2014-10-27 NOTE — Telephone Encounter (Signed)
-----  Message from Sueanne Margarita, MD sent at 10/25/2014  7:55 PM EDT ----- Please let patient know that stress test showed moderate to severely reduced LVF with ischemia in the inferoseptal area and scar in the inferior, apical and lateral walls.  Please set up for cath to assess patency of grafts.

## 2014-10-27 NOTE — Telephone Encounter (Signed)
Informed patient to HOLD LASIX until cath. Informed Harriet in Cath Lab there is no LVgram due to renal insufficiency. Instructed Short Stay to check STAT BMET on arrival as patient cannot come the day before.

## 2014-10-27 NOTE — Telephone Encounter (Signed)
Informed patient of results and verbal understanding expressed.  Reviewed catheterization procedure and risks and benefits. Patient agrees to heart cath. Procedure scheduled 7/28 at 0730 with Dr. Ellyn Hack. Patient to have pre-cath lab work and pick up instruction letter on Monday, 7/25. Patient agrees with treatment plan.

## 2014-10-27 NOTE — Telephone Encounter (Signed)
Please let cath lab no that there is no LVgram due to renal insuff.  Please have patient hold Lasix until cath.  Please check BMET stat the day before the cath

## 2014-10-28 ENCOUNTER — Ambulatory Visit: Payer: Commercial Managed Care - HMO | Admitting: Cardiology

## 2014-10-28 ENCOUNTER — Encounter (HOSPITAL_COMMUNITY): Payer: Self-pay | Admitting: Pharmacy Technician

## 2014-10-28 ENCOUNTER — Other Ambulatory Visit: Payer: Commercial Managed Care - HMO

## 2014-11-01 ENCOUNTER — Other Ambulatory Visit (INDEPENDENT_AMBULATORY_CARE_PROVIDER_SITE_OTHER): Payer: Commercial Managed Care - HMO | Admitting: *Deleted

## 2014-11-01 DIAGNOSIS — R931 Abnormal findings on diagnostic imaging of heart and coronary circulation: Secondary | ICD-10-CM | POA: Diagnosis not present

## 2014-11-01 DIAGNOSIS — Z01812 Encounter for preprocedural laboratory examination: Secondary | ICD-10-CM

## 2014-11-01 DIAGNOSIS — R9439 Abnormal result of other cardiovascular function study: Secondary | ICD-10-CM

## 2014-11-01 LAB — CBC WITH DIFFERENTIAL/PLATELET
BASOS PCT: 0.5 % (ref 0.0–3.0)
Basophils Absolute: 0.1 10*3/uL (ref 0.0–0.1)
EOS PCT: 1.9 % (ref 0.0–5.0)
Eosinophils Absolute: 0.2 10*3/uL (ref 0.0–0.7)
HCT: 39.1 % (ref 39.0–52.0)
HEMOGLOBIN: 13 g/dL (ref 13.0–17.0)
LYMPHS ABS: 1.9 10*3/uL (ref 0.7–4.0)
LYMPHS PCT: 17.4 % (ref 12.0–46.0)
MCHC: 33.3 g/dL (ref 30.0–36.0)
MCV: 87.6 fl (ref 78.0–100.0)
Monocytes Absolute: 1.5 10*3/uL — ABNORMAL HIGH (ref 0.1–1.0)
Monocytes Relative: 13.2 % — ABNORMAL HIGH (ref 3.0–12.0)
NEUTROS PCT: 67 % (ref 43.0–77.0)
Neutro Abs: 7.5 10*3/uL (ref 1.4–7.7)
Platelets: 357 10*3/uL (ref 150.0–400.0)
RBC: 4.46 Mil/uL (ref 4.22–5.81)
RDW: 15.9 % — ABNORMAL HIGH (ref 11.5–15.5)
WBC: 11.1 10*3/uL — AB (ref 4.0–10.5)

## 2014-11-01 LAB — BASIC METABOLIC PANEL
BUN: 27 mg/dL — ABNORMAL HIGH (ref 6–23)
CO2: 21 mEq/L (ref 19–32)
CREATININE: 1.58 mg/dL — AB (ref 0.40–1.50)
Calcium: 9 mg/dL (ref 8.4–10.5)
Chloride: 102 mEq/L (ref 96–112)
GFR: 45.08 mL/min — AB (ref >60.00)
GLUCOSE: 108 mg/dL — AB (ref 70–99)
POTASSIUM: 4.6 meq/L (ref 3.5–5.1)
Sodium: 131 mEq/L — ABNORMAL LOW (ref 135–145)

## 2014-11-01 LAB — PROTIME-INR
INR: 1.4 ratio — AB (ref 0.8–1.0)
PROTHROMBIN TIME: 15.5 s — AB (ref 9.6–13.1)

## 2014-11-01 LAB — APTT: aPTT: 31.1 s (ref 23.4–32.7)

## 2014-11-01 NOTE — Addendum Note (Signed)
Addended by: Eulis Foster on: 11/01/2014 09:23 AM   Modules accepted: Orders

## 2014-11-02 ENCOUNTER — Telehealth: Payer: Self-pay | Admitting: Cardiology

## 2014-11-02 NOTE — Telephone Encounter (Signed)
Spoke to Gordon ( works with pre admission) She states she was calling because patient had question about whether to bring medication with him to his schedule cardiac catheterization. RN informed Mliss Sax- patient is to follow letter that was given to him at office visit. Take medication with sip of water the morning only, hospital will give him medication that is needed while there. She states she will inform patient.

## 2014-11-02 NOTE — Telephone Encounter (Signed)
[name] is calling in to get some instructions for the pt for his Cath that will be done on 7/28. Please advise   Thanks

## 2014-11-04 ENCOUNTER — Ambulatory Visit (HOSPITAL_COMMUNITY)
Admission: RE | Admit: 2014-11-04 | Discharge: 2014-11-04 | Disposition: A | Discharge status: Home / Self Care | Payer: Commercial Managed Care - HMO | Source: Ambulatory Visit | Attending: Cardiology | Admitting: Cardiology

## 2014-11-04 ENCOUNTER — Encounter (HOSPITAL_COMMUNITY): Payer: Self-pay | Admitting: Cardiology

## 2014-11-04 ENCOUNTER — Encounter (HOSPITAL_COMMUNITY)
Admission: RE | Disposition: A | Discharge status: Home / Self Care | Payer: Commercial Managed Care - HMO | Source: Ambulatory Visit | Attending: Cardiology

## 2014-11-04 DIAGNOSIS — I951 Orthostatic hypotension: Secondary | ICD-10-CM | POA: Diagnosis not present

## 2014-11-04 DIAGNOSIS — I480 Paroxysmal atrial fibrillation: Secondary | ICD-10-CM | POA: Diagnosis not present

## 2014-11-04 DIAGNOSIS — I25118 Atherosclerotic heart disease of native coronary artery with other forms of angina pectoris: Secondary | ICD-10-CM | POA: Diagnosis not present

## 2014-11-04 DIAGNOSIS — I5042 Chronic combined systolic (congestive) and diastolic (congestive) heart failure: Secondary | ICD-10-CM | POA: Diagnosis not present

## 2014-11-04 DIAGNOSIS — I501 Left ventricular failure: Secondary | ICD-10-CM | POA: Diagnosis not present

## 2014-11-04 DIAGNOSIS — N189 Chronic kidney disease, unspecified: Secondary | ICD-10-CM | POA: Insufficient documentation

## 2014-11-04 DIAGNOSIS — I4891 Unspecified atrial fibrillation: Secondary | ICD-10-CM | POA: Insufficient documentation

## 2014-11-04 DIAGNOSIS — I255 Ischemic cardiomyopathy: Secondary | ICD-10-CM | POA: Insufficient documentation

## 2014-11-04 DIAGNOSIS — E780 Pure hypercholesterolemia: Secondary | ICD-10-CM | POA: Insufficient documentation

## 2014-11-04 DIAGNOSIS — I129 Hypertensive chronic kidney disease with stage 1 through stage 4 chronic kidney disease, or unspecified chronic kidney disease: Secondary | ICD-10-CM | POA: Diagnosis not present

## 2014-11-04 DIAGNOSIS — N184 Chronic kidney disease, stage 4 (severe): Secondary | ICD-10-CM | POA: Diagnosis present

## 2014-11-04 DIAGNOSIS — E1122 Type 2 diabetes mellitus with diabetic chronic kidney disease: Secondary | ICD-10-CM | POA: Insufficient documentation

## 2014-11-04 DIAGNOSIS — I2510 Atherosclerotic heart disease of native coronary artery without angina pectoris: Secondary | ICD-10-CM | POA: Diagnosis not present

## 2014-11-04 DIAGNOSIS — I420 Dilated cardiomyopathy: Secondary | ICD-10-CM

## 2014-11-04 DIAGNOSIS — I350 Nonrheumatic aortic (valve) stenosis: Secondary | ICD-10-CM | POA: Diagnosis not present

## 2014-11-04 DIAGNOSIS — Z7901 Long term (current) use of anticoagulants: Secondary | ICD-10-CM | POA: Insufficient documentation

## 2014-11-04 DIAGNOSIS — N183 Chronic kidney disease, stage 3 (moderate): Secondary | ICD-10-CM

## 2014-11-04 DIAGNOSIS — I4819 Other persistent atrial fibrillation: Secondary | ICD-10-CM | POA: Diagnosis present

## 2014-11-04 DIAGNOSIS — Z952 Presence of prosthetic heart valve: Secondary | ICD-10-CM | POA: Diagnosis not present

## 2014-11-04 DIAGNOSIS — Z951 Presence of aortocoronary bypass graft: Secondary | ICD-10-CM | POA: Diagnosis not present

## 2014-11-04 DIAGNOSIS — I10 Essential (primary) hypertension: Secondary | ICD-10-CM | POA: Diagnosis present

## 2014-11-04 DIAGNOSIS — E785 Hyperlipidemia, unspecified: Secondary | ICD-10-CM | POA: Diagnosis present

## 2014-11-04 HISTORY — PX: CARDIAC CATHETERIZATION: SHX172

## 2014-11-04 LAB — BASIC METABOLIC PANEL
Anion gap: 9 (ref 5–15)
BUN: 22 mg/dL — ABNORMAL HIGH (ref 6–20)
CO2: 21 mmol/L — AB (ref 22–32)
Calcium: 8.8 mg/dL — ABNORMAL LOW (ref 8.9–10.3)
Chloride: 105 mmol/L (ref 101–111)
Creatinine, Ser: 1.52 mg/dL — ABNORMAL HIGH (ref 0.61–1.24)
GFR calc non Af Amer: 42 mL/min — ABNORMAL LOW (ref >60)
GFR, EST AFRICAN AMERICAN: 48 mL/min — AB (ref >60)
Glucose, Bld: 134 mg/dL — ABNORMAL HIGH (ref 65–99)
POTASSIUM: 4.5 mmol/L (ref 3.5–5.1)
SODIUM: 135 mmol/L (ref 135–145)

## 2014-11-04 SURGERY — LEFT HEART CATH AND CORS/GRAFTS ANGIOGRAPHY
Anesthesia: LOCAL

## 2014-11-04 MED ORDER — ONDANSETRON HCL 4 MG/2ML IJ SOLN: 4.0000 mg | Freq: Four times a day (QID) | INTRAMUSCULAR | Status: DC | PRN

## 2014-11-04 MED ORDER — MIDAZOLAM HCL 2 MG/2ML IJ SOLN
INTRAMUSCULAR | Status: DC | PRN
  Administered 2014-11-04: 1 mg via INTRAVENOUS

## 2014-11-04 MED ORDER — HEPARIN (PORCINE) IN NACL 2-0.9 UNIT/ML-% IJ SOLN
INTRAMUSCULAR | Status: AC
  Filled 2014-11-04: qty 1500

## 2014-11-04 MED ORDER — FENTANYL CITRATE (PF) 100 MCG/2ML IJ SOLN
INTRAMUSCULAR | Status: AC
  Filled 2014-11-04: qty 2

## 2014-11-04 MED ORDER — SODIUM CHLORIDE 0.9 % IJ SOLN: 3.0000 mL | INTRAMUSCULAR | Status: DC | PRN

## 2014-11-04 MED ORDER — MIDAZOLAM HCL 2 MG/2ML IJ SOLN
INTRAMUSCULAR | Status: AC
  Filled 2014-11-04: qty 2

## 2014-11-04 MED ORDER — SODIUM CHLORIDE 0.9 % IV SOLN
INTRAVENOUS | Status: DC
  Administered 2014-11-04: 06:00:00 via INTRAVENOUS

## 2014-11-04 MED ORDER — ACETAMINOPHEN 325 MG PO TABS: 650.0000 mg | ORAL_TABLET | ORAL | Status: DC | PRN

## 2014-11-04 MED ORDER — SODIUM CHLORIDE 0.9 % IJ SOLN: 3.0000 mL | Freq: Two times a day (BID) | INTRAMUSCULAR | Status: DC

## 2014-11-04 MED ORDER — SODIUM CHLORIDE 0.9 % IV SOLN: INTRAVENOUS | Status: DC

## 2014-11-04 MED ORDER — ASPIRIN 81 MG PO CHEW: 81.0000 mg | CHEWABLE_TABLET | ORAL | Status: DC

## 2014-11-04 MED ORDER — VERAPAMIL HCL 2.5 MG/ML IV SOLN
INTRAVENOUS | Status: AC
  Filled 2014-11-04: qty 2

## 2014-11-04 MED ORDER — LIDOCAINE HCL (PF) 1 % IJ SOLN
INTRAMUSCULAR | Status: AC
  Filled 2014-11-04: qty 30

## 2014-11-04 MED ORDER — SODIUM CHLORIDE 0.9 % IV SOLN: 250.0000 mL | INTRAVENOUS | Status: DC | PRN

## 2014-11-04 MED ORDER — FENTANYL CITRATE (PF) 100 MCG/2ML IJ SOLN
INTRAMUSCULAR | Status: DC | PRN
  Administered 2014-11-04: 25 ug via INTRAVENOUS

## 2014-11-04 MED ORDER — RADIAL COCKTAIL (HEPARIN/VERAPAMIL/LIDOCAINE/NITRO)
Status: DC | PRN
  Administered 2014-11-04: 1 via INTRA_ARTERIAL

## 2014-11-04 MED ORDER — ASPIRIN 81 MG PO CHEW
CHEWABLE_TABLET | ORAL | Status: AC
  Administered 2014-11-04: 81 mg
  Filled 2014-11-04: qty 1

## 2014-11-04 MED ORDER — NITROGLYCERIN 1 MG/10 ML FOR IR/CATH LAB
INTRA_ARTERIAL | Status: DC | PRN
  Administered 2014-11-04: 09:00:00

## 2014-11-04 MED ORDER — HEPARIN SODIUM (PORCINE) 1000 UNIT/ML IJ SOLN
INTRAMUSCULAR | Status: DC | PRN
  Administered 2014-11-04: 4500 [IU] via INTRAVENOUS

## 2014-11-04 SURGICAL SUPPLY — 11 items
CATH INFINITI 5 FR IM (CATHETERS) ×1 IMPLANT
CATH INFINITI 5FR AL1 (CATHETERS) ×1 IMPLANT
CATH INFINITI 5FR MULTPACK ANG (CATHETERS) ×1 IMPLANT
DEVICE RAD COMP TR BAND LRG (VASCULAR PRODUCTS) ×2 IMPLANT
GLIDESHEATH SLEND A-KIT 6F 22G (SHEATH) ×2 IMPLANT
KIT HEART LEFT (KITS) ×2 IMPLANT
PACK CARDIAC CATHETERIZATION (CUSTOM PROCEDURE TRAY) ×2 IMPLANT
SYR MEDRAD MARK V 150ML (SYRINGE) ×2 IMPLANT
TRANSDUCER W/STOPCOCK (MISCELLANEOUS) ×2 IMPLANT
TUBING CIL FLEX 10 FLL-RA (TUBING) ×2 IMPLANT
WIRE SAFE-T 1.5MM-J .035X260CM (WIRE) ×2 IMPLANT

## 2014-11-04 NOTE — Interval H&P Note (Signed)
History and Physical Interval Note:  11/04/2014 7:52 AM  [name]  has presented today for surgery, with the diagnosis of abnormal nuc performed to assess a new finding of reduced EF - originally felt to be related to Afib. 3 separate defects noted.     The various methods of treatment have been discussed with the patient and family. After consideration of risks, benefits and other options for treatment, the patient has consented to  Procedure(s): Left Heart Cath and Cors/Grafts Angiography (N/A) as a surgical intervention .  The patient's history has been reviewed, patient examined, no change in status, stable for surgery.  I have reviewed the patient's chart and labs.  Questions were answered to the patient's satisfaction.     [place] [place] Lab Visit (complete for each Cath Lab visit)  Clinical Evaluation Leading to the Procedure:   ACS: No.  Non-ACS:    Anginal Classification: CCS II  Anti-ischemic medical therapy: Maximal Therapy (2 or more classes of medications) - not explained in note  Non-Invasive Test Results: High-risk stress test findings: cardiac mortality >3%/year  Prior CABG: Previous CABG  Ischemic Symptoms? CCS II (Slight limitation of ordinary activity) Anti-ischemic Medical Therapy? Maximal Medical Therapy (2 or more classes of medications) Non-invasive Test Results? High-risk stress test findings: cardiac mortality >3%/yr Prior CABG? Previous CABG   Patient Information:   >=1 SVG stenosis  A (8)  Indication: 55; Score: 8   Patient Information:   All bypass grafts patent, >=1 lesion(s) in native coronaries without bypass grafts  A (8)  Indication: 55; Score: 8   Patient Information:   Native 3V-CAD, failure of multiple grafts, depressed LVEF, patent LIMA graft PCI  U (6)  Indication: 68; Score: 6   Patient Information:   Native 3V-CAD, failure of multiple grafts, depressed LVEF, patent LIMA graft CABG  A (7)  Indication:  68; Score: 7   Patient Information:   Native 3V-CAD, failure of multiple grafts, depressed LVEF, nonfunctional LIMA graft PCI  A (8)  Indication: 69; Score: 8   Patient Information:   Native 3V-CAD, failure of multiple grafts, depressed LVEF, nonfunctional LIMA graft CABG  U (6)  Indication: 69; Score: 6  Tymel Conely, Leonie Green, M.D., M.S. Interventional Cardiologist   Pager # [phone]

## 2014-11-04 NOTE — H&P (View-Only) (Signed)
Cardiology Office Note   Date:  10/18/2014   ID:  [name], DOB 1932-12-15, MRN [id number]  PCP:  Abigail Miyamoto, MD    Chief Complaint  Patient presents with  . Follow-up    atrial fib      History of Present Illness: [name] is a 81 y.o. male with a history of HTN, AS s/p AVR, chronic combined systolic/diastolic CHF in setting of Afib with RVR, orthostatic hypotension, ASCAD s/p CABG and PAF. He now presents back for followup.  He was admitted with acute CHF in setting of afib with RVR in May and underwent DCCV.  2D echo showed EF 30-35% and was felt to be tachycardia driven. He was started on Amio.    He denies any dizziness, palpitations or syncope. He denies any chest pain,  LE edema.He has some DOE with extreme exertion.  He has noticed increased LE edema in the past few days that resolves at night.   Recent 2D echo showed persistently reduced LVF with EF 25-30% with inferior and apical severe HK and diffuse HK.      Past Medical History  Diagnosis Date  . Heart murmur   . Hypercholesteremia   . HTN (hypertension)   . Diastolic CHF     2D echo 11/968 - nl LVF, mild LAE, MAC with moderately thickened MV leaflets and trivial MR, normal functioning prosthetic AVR with mild increased gradient, grade II diastolic dysfunction   . Hypotension   . Orthostatic hypotension   . CKD (chronic kidney disease)   . Aortic valve disorder 2009    severe AS s/p AVR with pericardial tissue valve  . Diabetes   . Aortic stenosis   . Barrett esophagus   . CAD (coronary artery disease)     s/p 2 vessel CABG with LIMA to LAD, SVG to diagonal 1, SVG to RCA 12/09  . Cardiomyopathy   . H/O: rheumatic fever   . Atrial fibrillation     post op afib with no reoccurence  . Cardiomyopathy     a. Echo 5/16:  mild LVH, EF 30-35%, diff HK, AVR ok, mild MR, severe LAE, mod RAE, reduced RVSF.  Repeat limited echo 10/2014 with EF 25-30%  . History of PFTs     a.  Amiodarone started 5/16 >> PFTs w/ DLCO 6/16:  FEV1 72% predicted, FEV1/FVC 66%, DLCO 66% >> minimal reversible obstructive airways disease with mild diffusion defect (suggestive of emphysema but absence of hyperinflation inconsistent with dx)    Past Surgical History  Procedure Laterality Date  . Coronary artery bypass graft      x 3 Dr. Roxan Hockey  . Avr with pericardial tissue valve    . Intra-ocular lens os dr Anders Simmonds    . Cardioversion N/A 01/27/2013    Procedure: CARDIOVERSION;  Surgeon: Sueanne Margarita, MD;  Location: [facility];  Service: Cardiovascular;  Laterality: N/A;  . Cardioversion N/A 08/25/2014    Procedure: CARDIOVERSION;  Surgeon: Sanda Klein, MD;  Location: [facility];  Service: Cardiovascular;  Laterality: N/A;     Current Outpatient Prescriptions  Medication Sig Dispense Refill  . acetaminophen (TYLENOL) 500 MG tablet Take 500 mg by mouth every 6 (six) hours as needed for moderate pain.    Marland Kitchen amiodarone (PACERONE) 200 MG tablet Take 1 tablet (200 mg total) by mouth daily. 90 tablet 3  . amLODipine (NORVASC) 10 MG  tablet Take 5 mg by mouth 2 (two) times daily.     Marland Kitchen apixaban (ELIQUIS) 5 MG TABS tablet Take 1 tablet (5 mg total) by mouth 2 (two) times daily. 180 tablet 3  . atenolol (TENORMIN) 25 MG tablet 1 tab in the am    . atorvastatin (LIPITOR) 20 MG tablet Take 20 mg by mouth daily.    Marland Kitchen lisinopril (PRINIVIL,ZESTRIL) 40 MG tablet Take 40 mg by mouth daily.    . Magnesium 250 MG TABS Take 1 tablet by mouth daily.     . Multiple Vitamin (MULTIVITAMIN) capsule Take 1 capsule by mouth daily.    Marland Kitchen omega-3 acid ethyl esters (LOVAZA) 1 G capsule Take 2 g by mouth 2 (two) times daily.    Marland Kitchen omeprazole (PRILOSEC) 20 MG capsule Take 20 mg by mouth daily.    . ranitidine (ZANTAC) 300 MG tablet Take 300 mg by mouth at bedtime.    . traMADol (ULTRAM) 50 MG tablet Take 50 mg by mouth every 6 (six) hours as needed for moderate pain or severe pain (pain).    .  valACYclovir (VALTREX) 1000 MG tablet Take 1 tablet (1,000 mg total) by mouth every 8 (eight) hours. 12 tablet 0   No current facility-administered medications for this visit.    Allergies:   Novocain    Social History:  The patient  reports that he has never smoked. He does not have any smokeless tobacco history on file. He reports that he does not drink alcohol or use illicit drugs.   Family History:  The patient's family history includes Alzheimer's disease in his mother and another family member.    ROS:  Please see the history of present illness.   Otherwise, review of systems are positive for none.   All other systems are reviewed and negative.    PHYSICAL EXAM: VS:  BP 134/60 mmHg  Pulse 81  Ht 6' (1.829 m)  Wt 210 lb 6.4 oz (95.437 kg)  BMI 28.53 kg/m2  SpO2 96% , BMI Body mass index is 28.53 kg/(m^2). GEN: Well nourished, well developed, in no acute distress HEENT: normal Neck: no JVD, carotid bruits, or masses Cardiac: RRR; no murmurs, rubs, or gallops.  1-2+ edema of LE  Respiratory:  clear to auscultation bilaterally, normal work of breathing GI: soft, nontender, nondistended, + BS MS: no deformity or atrophy Skin: warm and dry, no rash Neuro:  Strength and sensation are intact Psych: euthymic mood, full affect   EKG:  EKG is not ordered today.    Recent Labs: 08/22/2014: B Natriuretic Peptide 592.3* 08/23/2014: Hemoglobin 13.7; Magnesium 2.2; Platelets 188 09/13/2014: BUN 22; Creatinine, Ser 1.78*; Potassium 4.4; Sodium 134* 10/14/2014: ALT 24; TSH 1.85    Lipid Panel    Component Value Date/Time   CHOL 93* 06/16/2014 0914   TRIG 124 06/16/2014 0914   HDL 34* 06/16/2014 0914   LDLCALC 34 06/16/2014 0914      Wt Readings from Last 3 Encounters:  10/18/14 210 lb 6.4 oz (95.437 kg)  09/03/14 211 lb 6.4 oz (95.89 kg)  08/26/14 208 lb 5.4 oz (94.5 kg)     ASSESSMENT AND PLAN:  1. Paroxysmal atrial fibrillation s/p  DCCV and maintaining NS  -  continue apixaban/BB/amio  - decrease Apixaban to 2.30m BID. He is turning 82 in 1 month and creatinine is > 1.5 2. HTN - controlled - continue amlodipine/BB  - stop ACE I secondary to CKD  - Add hydralazine 145mTID 3.  Severe AS s/p prosthetic AVR 4. Chronic systemic anticoagulation 5. Chronic combined systolic/diastolic CHF - he has been having increased LE edema.  His Lasix was stopped recently due to worsening renal function.  He needs to be on diuretics so will stop his ACE I and replace with Imdur/Hydralazine and start lasix.  He will need a repeat echo in 3 months on medical therapy to assess if LVF has improved.  I will get a Lexiscan myoview to rule out ischemia from graft failure given sudden decline in EF that did not improve with restoration of NSR. - restart Lasix 59m daily  - change Atenolol to Coreg 6.26mBID  - stop ACE I secondary to CKD  - add hydralazine 1033mID and Imdur 40m62mily 6. ASCAD s/p CABG 7.  Dyslipidemia - continue lipitor.  LDL at goal   Current medicines are reviewed at length with the patient today.  The patient does not have concerns regarding medicines.  The following changes have been made:  Stop Lisinopril, stop atenolol, start coreg 6.25mg40m, start Lasix 20mg 53my, start hydralazine 10mg T49mstart Imdur 40mg da7m decrease eliquis to 2.5mg BID 44mbs/ tests ordered today: See above Assessment and Plan No orders of the defined types were placed in this encounter.     Disposition:   FU with me in 1 week  Signed, TURNER,TRSueanne Margarita1/2016 9:35 AM    [facility] [phone]

## 2014-11-04 NOTE — Discharge Instructions (Signed)
Radial Site Care Refer to this sheet in the next few weeks. These instructions provide you with information on caring for yourself after your procedure. Your caregiver may also give you more specific instructions. Your treatment has been planned according to current medical practices, but problems sometimes occur. Call your caregiver if you have any problems or questions after your procedure. HOME CARE INSTRUCTIONS  You may shower the day after the procedure.Remove the bandage (dressing) and gently wash the site with plain soap and water.Gently pat the site dry.  Do not apply powder or lotion to the site.  Do not submerge the affected site in water for 3 to 5 days.  Inspect the site at least twice daily.  Do not flex or bend the affected arm for 24 hours.  No lifting over 5 pounds (2.3 kg) for 5 days after your procedure.  Do not drive home if you are discharged the same day of the procedure. Have someone else drive you.  You may drive 24 hours after the procedure unless otherwise instructed by your caregiver.  Do not operate machinery or power tools for 24 hours.  A responsible adult should be with you for the first 24 hours after you arrive home. What to expect:  Any bruising will usually fade within 1 to 2 weeks.  Blood that collects in the tissue (hematoma) may be painful to the touch. It should usually decrease in size and tenderness within 1 to 2 weeks. SEEK IMMEDIATE MEDICAL CARE IF:  You have unusual pain at the radial site.  You have redness, warmth, swelling, or pain at the radial site.  You have drainage (other than a small amount of blood on the dressing).  You have chills.  You have a fever or persistent symptoms for more than 72 hours.  You have a fever and your symptoms suddenly get worse.  Your arm becomes pale, cool, tingly, or numb.  You have heavy bleeding from the site. Hold pressure on the site. Document Released: 04/28/2010 Document Revised:  06/18/2011 Document Reviewed: 04/28/2010 [facility] Patient Information 2015 [place], [place]. This information is not intended to replace advice given to you by your health care provider. Make sure you discuss any questions you have with your health care provider.

## 2014-11-05 MED FILL — Verapamil HCl IV Soln 2.5 MG/ML: INTRAVENOUS | Qty: 2 | Status: AC

## 2015-01-03 ENCOUNTER — Other Ambulatory Visit: Payer: Self-pay | Admitting: Cardiology

## 2015-01-05 ENCOUNTER — Telehealth: Payer: Self-pay | Admitting: Cardiology

## 2015-01-05 NOTE — Telephone Encounter (Signed)
New message     Calling to report a dispensing error Pt was given isosorbide dinitrate instead of isosorbide mononitrate.  Pt was taking isosorbide dinitrate for 2 months.

## 2015-01-05 NOTE — Telephone Encounter (Signed)
Confirmed with [facility] pharmacist the correct medication (Imdur 30 mg daily) was dispensed today.  Called patient. He st that he does not have any symptoms and he will take the correct medication. Instructed patient to call back if he has any questions or concerns.

## 2015-01-17 DIAGNOSIS — R7309 Other abnormal glucose: Secondary | ICD-10-CM | POA: Diagnosis not present

## 2015-01-17 DIAGNOSIS — I1310 Hypertensive heart and chronic kidney disease without heart failure, with stage 1 through stage 4 chronic kidney disease, or unspecified chronic kidney disease: Secondary | ICD-10-CM | POA: Diagnosis not present

## 2015-01-17 DIAGNOSIS — M79604 Pain in right leg: Secondary | ICD-10-CM | POA: Diagnosis not present

## 2015-01-17 DIAGNOSIS — Z23 Encounter for immunization: Secondary | ICD-10-CM | POA: Diagnosis not present

## 2015-01-17 DIAGNOSIS — K219 Gastro-esophageal reflux disease without esophagitis: Secondary | ICD-10-CM | POA: Diagnosis not present

## 2015-01-17 DIAGNOSIS — N183 Chronic kidney disease, stage 3 (moderate): Secondary | ICD-10-CM | POA: Diagnosis not present

## 2015-01-17 DIAGNOSIS — E7800 Pure hypercholesterolemia, unspecified: Secondary | ICD-10-CM | POA: Diagnosis not present

## 2015-01-19 ENCOUNTER — Ambulatory Visit (INDEPENDENT_AMBULATORY_CARE_PROVIDER_SITE_OTHER): Payer: Commercial Managed Care - HMO | Admitting: Cardiology

## 2015-01-19 ENCOUNTER — Telehealth: Payer: Self-pay

## 2015-01-19 ENCOUNTER — Encounter: Payer: Self-pay | Admitting: Cardiology

## 2015-01-19 VITALS — BP 168/90 | HR 79 | Ht 72.0 in | Wt 210.0 lb

## 2015-01-19 DIAGNOSIS — I480 Paroxysmal atrial fibrillation: Secondary | ICD-10-CM

## 2015-01-19 DIAGNOSIS — I255 Ischemic cardiomyopathy: Secondary | ICD-10-CM

## 2015-01-19 DIAGNOSIS — I25118 Atherosclerotic heart disease of native coronary artery with other forms of angina pectoris: Secondary | ICD-10-CM

## 2015-01-19 DIAGNOSIS — I10 Essential (primary) hypertension: Secondary | ICD-10-CM

## 2015-01-19 DIAGNOSIS — E785 Hyperlipidemia, unspecified: Secondary | ICD-10-CM | POA: Diagnosis not present

## 2015-01-19 DIAGNOSIS — I4891 Unspecified atrial fibrillation: Secondary | ICD-10-CM | POA: Diagnosis not present

## 2015-01-19 DIAGNOSIS — I5042 Chronic combined systolic (congestive) and diastolic (congestive) heart failure: Secondary | ICD-10-CM

## 2015-01-19 DIAGNOSIS — I2510 Atherosclerotic heart disease of native coronary artery without angina pectoris: Secondary | ICD-10-CM

## 2015-01-19 DIAGNOSIS — I2583 Coronary atherosclerosis due to lipid rich plaque: Principal | ICD-10-CM

## 2015-01-19 DIAGNOSIS — R0989 Other specified symptoms and signs involving the circulatory and respiratory systems: Secondary | ICD-10-CM

## 2015-01-19 DIAGNOSIS — I350 Nonrheumatic aortic (valve) stenosis: Secondary | ICD-10-CM

## 2015-01-19 LAB — CBC WITH DIFFERENTIAL/PLATELET
BASOS ABS: 0 10*3/uL (ref 0.0–0.1)
Basophils Relative: 0 % (ref 0–1)
Eosinophils Absolute: 0.1 10*3/uL (ref 0.0–0.7)
Eosinophils Relative: 1 % (ref 0–5)
HEMATOCRIT: 45 % (ref 39.0–52.0)
HEMOGLOBIN: 15 g/dL (ref 13.0–17.0)
Lymphocytes Relative: 24 % (ref 12–46)
Lymphs Abs: 1.8 10*3/uL (ref 0.7–4.0)
MCH: 29.2 pg (ref 26.0–34.0)
MCHC: 33.3 g/dL (ref 30.0–36.0)
MCV: 87.5 fL (ref 78.0–100.0)
MONO ABS: 1.1 10*3/uL — AB (ref 0.1–1.0)
MPV: 9.3 fL (ref 8.6–12.4)
Monocytes Relative: 14 % — ABNORMAL HIGH (ref 3–12)
NEUTROS ABS: 4.6 10*3/uL (ref 1.7–7.7)
Neutrophils Relative %: 61 % (ref 43–77)
Platelets: 259 10*3/uL (ref 150–400)
RBC: 5.14 MIL/uL (ref 4.22–5.81)
RDW: 14.1 % (ref 11.5–15.5)
WBC: 7.6 10*3/uL (ref 4.0–10.5)

## 2015-01-19 LAB — BASIC METABOLIC PANEL
BUN: 26 mg/dL — ABNORMAL HIGH (ref 7–25)
CHLORIDE: 101 mmol/L (ref 98–110)
CO2: 23 mmol/L (ref 20–31)
Calcium: 9.3 mg/dL (ref 8.6–10.3)
Creat: 1.77 mg/dL — ABNORMAL HIGH (ref 0.70–1.11)
GLUCOSE: 126 mg/dL — AB (ref 65–99)
POTASSIUM: 4.6 mmol/L (ref 3.5–5.3)
Sodium: 136 mmol/L (ref 135–146)

## 2015-01-19 LAB — TSH: TSH: 1.654 u[IU]/mL (ref 0.350–4.500)

## 2015-01-19 LAB — LIPID PANEL
Cholesterol: 123 mg/dL — ABNORMAL LOW (ref 125–200)
HDL: 45 mg/dL (ref >=40)
LDL CALC: 46 mg/dL (ref <130)
Total CHOL/HDL Ratio: 2.7 Ratio (ref <=5.0)
Triglycerides: 161 mg/dL — ABNORMAL HIGH (ref <150)
VLDL: 32 mg/dL — AB (ref <30)

## 2015-01-19 LAB — HEPATIC FUNCTION PANEL
ALT: 32 U/L (ref 9–46)
AST: 29 U/L (ref 10–35)
Albumin: 4.4 g/dL (ref 3.6–5.1)
Alkaline Phosphatase: 41 U/L (ref 40–115)
BILIRUBIN INDIRECT: 0.5 mg/dL (ref 0.2–1.2)
Bilirubin, Direct: 0.2 mg/dL (ref <=0.2)
Total Bilirubin: 0.7 mg/dL (ref 0.2–1.2)
Total Protein: 7 g/dL (ref 6.1–8.1)

## 2015-01-19 MED ORDER — ISOSORBIDE MONONITRATE ER 30 MG PO TB24
30.0000 mg | ORAL_TABLET | Freq: Every day | ORAL | Status: DC
Start: 2015-01-19 — End: 2015-09-19

## 2015-01-19 MED ORDER — HYDRALAZINE HCL 25 MG PO TABS: 25.0000 mg | ORAL_TABLET | Freq: Three times a day (TID) | ORAL | Status: DC

## 2015-01-19 MED ORDER — CARVEDILOL 6.25 MG PO TABS: 6.2500 mg | ORAL_TABLET | Freq: Every day | ORAL | Status: DC

## 2015-01-19 MED ORDER — APIXABAN 2.5 MG PO TABS: 2.5000 mg | ORAL_TABLET | Freq: Two times a day (BID) | ORAL | Status: DC

## 2015-01-19 MED ORDER — FUROSEMIDE 20 MG PO TABS: 20.0000 mg | ORAL_TABLET | ORAL | Status: DC

## 2015-01-19 NOTE — Patient Instructions (Signed)
Medication Instructions:  Your physician has recommended you make the following change in your medication:  1) INCREASE HYDRALAZINE to 25 mg three times daily  Labwork: TODAY: BMET, CBC, TSH, LFTs, lipids  Testing/Procedures: Your physician has requested that you have an echocardiogram. Echocardiography is a painless test that uses sound waves to create images of your heart. It provides your doctor with information about the size and shape of your heart and how well your heart's chambers and valves are working. This procedure takes approximately one hour. There are no restrictions for this procedure.   Your physician has requested that you have a carotid duplex. This test is an ultrasound of the carotid arteries in your neck. It looks at blood flow through these arteries that supply the brain with blood. Allow one hour for this exam. There are no restrictions or special instructions.  Follow-Up: Your physician recommends that you schedule a follow-up appointment in: 2 weeks with the [facility]  Your physician wants you to follow-up in: 6 months with Dr. Radford Pax. You will receive a reminder letter in the mail two months in advance. If you don't receive a letter, please call our office to schedule the follow-up appointment.   Any Other Special Instructions Will Be Listed Below (If Applicable).

## 2015-01-19 NOTE — Telephone Encounter (Signed)
Per Dr. Radford Pax, instructed patient to hold Lasix for 2 days then take qod. Repeat BMET scheduled for next Wednesday. Refills sent to [facility] per patient request. Patient agrees with treatment plan.

## 2015-01-19 NOTE — Progress Notes (Signed)
Cardiology Office Note   Date:  01/19/2015   ID:  [name], DOB 1933/01/08, MRN [id number]  PCP:  [name] Miyamoto, MD    Chief Complaint  Patient presents with  . Coronary Artery Disease  . Cardiomyopathy  . Hypertension      History of Present Illness: [name] is a 81 y.o. male with a history of HTN, AS s/p AVR, chronic combined systolic/diastolic CHF in setting of Afib with RVR, orthostatic hypotension, Chronic combined systolic/diastolic CHF, DCM EF 97-94%, ASCAD s/p CABG and PAF. He now presents back for followup. He denies anypalpitations or syncope. He denies any chest pain, LE edema.He has some DOE with extreme exertion. He had 1 episode of feeling weak where his SBP was in the 80's.  He brings in his BP readings today which range from 801-655'V systolic.  The time he felt weak his SBP was in the 80's.    Past Medical History  Diagnosis Date  . Heart murmur   . Hypercholesteremia   . HTN (hypertension)   . Diastolic CHF ([place])     2D echo 12/2010 - nl LVF, mild LAE, MAC with moderately thickened MV leaflets and trivial MR, normal functioning prosthetic AVR with mild increased gradient, grade II diastolic dysfunction   . Hypotension   . Orthostatic hypotension   . CKD (chronic kidney disease)   . Aortic valve disorder 2009    severe AS s/p AVR with pericardial tissue valve  . Diabetes ([place])   . Aortic stenosis   . Barrett esophagus   . CAD (coronary artery disease)     s/p 2 vessel CABG with LIMA to LAD, SVG to diagonal 1, SVG to RCA 12/09  . Cardiomyopathy ([place])   . H/O: rheumatic fever   . Atrial fibrillation ([place])     post op afib with no reoccurence  . Cardiomyopathy ([place])     a. Echo 5/16:  mild LVH, EF 30-35%, diff HK, AVR ok, mild MR, severe LAE, mod RAE, reduced RVSF.  Repeat limited echo 10/2014 with EF 25-30%  . History of PFTs     a. Amiodarone started 5/16 >> PFTs w/ DLCO 6/16:  FEV1 72% predicted, FEV1/FVC 66%,  DLCO 66% >> minimal reversible obstructive airways disease with mild diffusion defect (suggestive of emphysema but absence of hyperinflation inconsistent with dx)    Past Surgical History  Procedure Laterality Date  . Coronary artery bypass graft      x 3 Dr. Roxan Hockey  . Avr with pericardial tissue valve    . Intra-ocular lens os dr Anders Simmonds    . Cardioversion N/A 01/27/2013    Procedure: CARDIOVERSION;  Surgeon: Sueanne Margarita, MD;  Location: [facility];  Service: Cardiovascular;  Laterality: N/A;  . Cardioversion N/A 08/25/2014    Procedure: CARDIOVERSION;  Surgeon: Sanda Klein, MD;  Location: [facility];  Service: Cardiovascular;  Laterality: N/A;  . Cardiac catheterization N/A 11/04/2014    Procedure: Left Heart Cath and Cors/Grafts Angiography;  Surgeon: Leonie Man, MD;  Location: [facility];  Service: Cardiovascular;  Laterality: N/A;     Current Outpatient Prescriptions  Medication Sig Dispense Refill  . acetaminophen (TYLENOL) 500 MG tablet Take 500 mg by mouth every 6 (six) hours as needed for moderate pain.    Marland Kitchen amiodarone (PACERONE) 200 MG tablet Take 1 tablet (200 mg total) by mouth daily. 90 tablet  3  . apixaban (ELIQUIS) 2.5 MG TABS tablet Take 1 tablet (2.5 mg total) by mouth 2 (two) times daily. 60 tablet 6  . atorvastatin (LIPITOR) 20 MG tablet Take 20 mg by mouth daily.    . carvedilol (COREG) 6.25 MG tablet Take 1 tablet (6.25 mg total) by mouth 2 (two) times daily with a meal. (Patient taking differently: Take 6.25 mg by mouth daily. ) 60 tablet 2  . furosemide (LASIX) 20 MG tablet Take 1 tablet (20 mg total) by mouth daily. 30 tablet 2  . hydrALAZINE (APRESOLINE) 10 MG tablet TAKE 1 TABLET BY MOUTH 3 TIMES A DAY 90 tablet 0  . HYDROcodone Bitartrate 10 MG C12A Take 1 tablet by mouth daily. Take every day per patient    . isosorbide mononitrate (IMDUR) 30 MG 24 hr tablet Take 1 tablet (30 mg total) by mouth daily. 30 tablet 2  . Magnesium 250 MG  TABS Take 1 tablet by mouth daily.     . Multiple Vitamin (MULTIVITAMIN) capsule Take 1 capsule by mouth daily.    Marland Kitchen omega-3 acid ethyl esters (LOVAZA) 1 G capsule Take 2 g by mouth 2 (two) times daily.    . ranitidine (ZANTAC) 300 MG tablet Take 300 mg by mouth at bedtime.    . traMADol (ULTRAM) 50 MG tablet Take 50 mg by mouth every 6 (six) hours as needed for moderate pain or severe pain (pain).     No current facility-administered medications for this visit.    Allergies:   Novocain    Social History:  The patient  reports that he has never smoked. He does not have any smokeless tobacco history on file. He reports that he does not drink alcohol or use illicit drugs.   Family History:  The patient's family history includes Alzheimer's disease in his mother and another family member.    ROS:  Please see the history of present illness.   Otherwise, review of systems are positive for none.   All other systems are reviewed and negative.    PHYSICAL EXAM: VS:  BP 168/90 mmHg  Pulse 79  Ht 6' (1.829 m)  Wt 210 lb (95.255 kg)  BMI 28.47 kg/m2  SpO2 98% , BMI Body mass index is 28.47 kg/(m^2). GEN: Well nourished, well developed, in no acute distress HEENT: normal Neck: no JVD or masses.  Left carotid artery bruit Cardiac: RRR; no murmurs, rubs, or gallops,no edema  Respiratory:  clear to auscultation bilaterally, normal work of breathing GI: soft, nontender, nondistended, + BS MS: no deformity or atrophy Skin: warm and dry, no rash Neuro:  Strength and sensation are intact Psych: euthymic mood, full affect   EKG:  EKG is not ordered today.    Recent Labs: 08/22/2014: B Natriuretic Peptide 592.3* 08/23/2014: Magnesium 2.2 10/14/2014: ALT 24; TSH 1.85 11/01/2014: Hemoglobin 13.0; Platelets 357.0 11/04/2014: BUN 22*; Creatinine, Ser 1.52*; Potassium 4.5; Sodium 135    Lipid Panel    Component Value Date/Time   CHOL 93* 06/16/2014 0914   TRIG 124 06/16/2014 0914   HDL 34*  06/16/2014 0914   LDLCALC 34 06/16/2014 0914      Wt Readings from Last 3 Encounters:  01/19/15 210 lb (95.255 kg)  11/04/14 200 lb (90.719 kg)  10/25/14 210 lb (95.255 kg)      ASSESSMENT AND PLAN:  1. Paroxysmal atrial fibrillation s/p DCCV and maintaining NS - continue apixaban/BB/amio  - check TSH and LFTs 2. HTN - poorly controlled - continue  amlodipine/BB/Hydralazine  - Increase Hydralazine to 76m TID  - followup in HTN clinic in 2 weeks 3. Severe AS s/p prosthetic AVR 4. Chronic systemic anticoagulation 6. Chronic combined systolic/diastolic CHF - Continue Imdur/Hydralazine/lasix. He is not on an ACE I due to CKD. Will get repeat echo now after 3 months maximal medical therapy.  If EF has not improved will need EP referral for AICD.   7. ASCAD s/p CABG with repeat cath for reduced LVF and abnormal stress test showing occluded RCA and widely patent LIMA to LAD, SVG to distal RCA and SVG to diagonal.   8. Dyslipidemia - continue lipitor. LDL at goal.  Check FLP and ALT 9.  Left carotid bruit - check carotid dopplers  Current medicines are reviewed at length with the patient today.  The patient does not have concerns regarding medicines.  The following changes have been made:  no change  Labs/ tests ordered today: See above Assessment and Plan No orders of the defined types were placed in this encounter.     Disposition:   FU with me in 6 months  Signed, TSueanne Margarita MD  01/19/2015 9:22 AM    [facility] [address] [place]  [id number]: [phone] Fax: [phone]

## 2015-01-26 ENCOUNTER — Ambulatory Visit (HOSPITAL_COMMUNITY)
Admission: RE | Admit: 2015-01-26 | Discharge: 2015-01-26 | Disposition: A | Discharge status: Home / Self Care | Payer: Commercial Managed Care - HMO | Source: Ambulatory Visit | Attending: Cardiology | Admitting: Cardiology

## 2015-01-26 ENCOUNTER — Other Ambulatory Visit: Payer: Commercial Managed Care - HMO

## 2015-01-26 ENCOUNTER — Telehealth: Payer: Self-pay

## 2015-01-26 DIAGNOSIS — N189 Chronic kidney disease, unspecified: Secondary | ICD-10-CM | POA: Diagnosis not present

## 2015-01-26 DIAGNOSIS — E7800 Pure hypercholesterolemia, unspecified: Secondary | ICD-10-CM | POA: Diagnosis not present

## 2015-01-26 DIAGNOSIS — R0989 Other specified symptoms and signs involving the circulatory and respiratory systems: Secondary | ICD-10-CM | POA: Diagnosis not present

## 2015-01-26 DIAGNOSIS — I2510 Atherosclerotic heart disease of native coronary artery without angina pectoris: Secondary | ICD-10-CM | POA: Insufficient documentation

## 2015-01-26 DIAGNOSIS — I6523 Occlusion and stenosis of bilateral carotid arteries: Secondary | ICD-10-CM | POA: Diagnosis not present

## 2015-01-26 DIAGNOSIS — I129 Hypertensive chronic kidney disease with stage 1 through stage 4 chronic kidney disease, or unspecified chronic kidney disease: Secondary | ICD-10-CM | POA: Insufficient documentation

## 2015-01-26 DIAGNOSIS — E119 Type 2 diabetes mellitus without complications: Secondary | ICD-10-CM | POA: Diagnosis not present

## 2015-01-26 NOTE — Telephone Encounter (Signed)
-----  Message from Sueanne Margarita, MD sent at 01/26/2015  1:32 PM EDT ----- 1-39% right ICA and 40-59% left ICA stenosis - repeat study in 1 year

## 2015-01-26 NOTE — Telephone Encounter (Signed)
Informed patient of results and verbal understanding expressed.  Repeat study ordered to be scheduled in 1 year. Patient agrees with treatment plan.

## 2015-01-31 ENCOUNTER — Other Ambulatory Visit: Payer: Self-pay | Admitting: Cardiology

## 2015-02-02 ENCOUNTER — Other Ambulatory Visit: Payer: Self-pay

## 2015-02-02 ENCOUNTER — Ambulatory Visit (INDEPENDENT_AMBULATORY_CARE_PROVIDER_SITE_OTHER): Payer: Commercial Managed Care - HMO | Admitting: Pharmacist

## 2015-02-02 ENCOUNTER — Ambulatory Visit (HOSPITAL_COMMUNITY): Payer: Commercial Managed Care - HMO | Attending: Cardiology

## 2015-02-02 ENCOUNTER — Other Ambulatory Visit (INDEPENDENT_AMBULATORY_CARE_PROVIDER_SITE_OTHER): Payer: Commercial Managed Care - HMO

## 2015-02-02 VITALS — BP 162/88 | HR 70

## 2015-02-02 DIAGNOSIS — N183 Chronic kidney disease, stage 3 (moderate): Secondary | ICD-10-CM | POA: Diagnosis not present

## 2015-02-02 DIAGNOSIS — I5042 Chronic combined systolic (congestive) and diastolic (congestive) heart failure: Secondary | ICD-10-CM

## 2015-02-02 DIAGNOSIS — I340 Nonrheumatic mitral (valve) insufficiency: Secondary | ICD-10-CM | POA: Diagnosis not present

## 2015-02-02 DIAGNOSIS — I071 Rheumatic tricuspid insufficiency: Secondary | ICD-10-CM | POA: Diagnosis not present

## 2015-02-02 DIAGNOSIS — Z953 Presence of xenogenic heart valve: Secondary | ICD-10-CM | POA: Diagnosis not present

## 2015-02-02 DIAGNOSIS — I10 Essential (primary) hypertension: Secondary | ICD-10-CM

## 2015-02-02 DIAGNOSIS — R29898 Other symptoms and signs involving the musculoskeletal system: Secondary | ICD-10-CM | POA: Insufficient documentation

## 2015-02-02 DIAGNOSIS — Z951 Presence of aortocoronary bypass graft: Secondary | ICD-10-CM | POA: Insufficient documentation

## 2015-02-02 DIAGNOSIS — I517 Cardiomegaly: Secondary | ICD-10-CM | POA: Diagnosis not present

## 2015-02-02 DIAGNOSIS — I25758 Atherosclerosis of native coronary artery of transplanted heart with other forms of angina pectoris: Secondary | ICD-10-CM

## 2015-02-02 DIAGNOSIS — I255 Ischemic cardiomyopathy: Secondary | ICD-10-CM

## 2015-02-02 DIAGNOSIS — I480 Paroxysmal atrial fibrillation: Secondary | ICD-10-CM

## 2015-02-02 DIAGNOSIS — I350 Nonrheumatic aortic (valve) stenosis: Secondary | ICD-10-CM

## 2015-02-02 LAB — BASIC METABOLIC PANEL
BUN: 21 mg/dL (ref 7–25)
CHLORIDE: 101 mmol/L (ref 98–110)
CO2: 22 mmol/L (ref 20–31)
Calcium: 9.1 mg/dL (ref 8.6–10.3)
Creat: 1.66 mg/dL — ABNORMAL HIGH (ref 0.70–1.11)
GLUCOSE: 127 mg/dL — AB (ref 65–99)
POTASSIUM: 4.6 mmol/L (ref 3.5–5.3)
Sodium: 135 mmol/L (ref 135–146)

## 2015-02-02 MED ORDER — LISINOPRIL 10 MG PO TABS: 10.0000 mg | ORAL_TABLET | Freq: Every day | ORAL | Status: DC

## 2015-02-02 MED ORDER — PERFLUTREN LIPID MICROSPHERE
2.0000 mL | Freq: Once | INTRAVENOUS | Status: AC
  Administered 2015-02-02: 2 mL via INTRAVENOUS

## 2015-02-02 NOTE — Progress Notes (Signed)
Patient ID: [name]                 DOB: 1932/08/28, 81 yo                         MRN: [id number]     HPI: [name] is a 81 y.o. male referred by Dr. Radford Pax to HTN clinic. PMH is significant for HTN, chronic combined systolic/diastolic CHF with EF 38-46%, DM, CKD, afib with RVR, and ASCVD s/p CABG. Patient was seen 2 weeks ago by Dr. Radford Pax - during that visit, hydralazine was titrated up from 108m TID to 294mTID. Patient checks his BP at home and reports that readings are still consistently elevated. His BP was better controlled over the summer. Of note, he was taking lisinopril 4068maily. This was d/ced in July and BP has been consistently elevated since then despite addition of hydralazine.  Of note, patient reports taking potassium gluconate daily - this was not on his med list prior to visit.  Current HTN meds: Coreg 6.33m4mD, Lasix 20mg61mry other day, hydralazine 33mg 69m Imdur 30mg d64m BP goal: <150/90mmHg.4min clinic today 162/88, pulse 70.   Family History: Alzheimer's disease in his mother.  Social History: Patient reports that he has never smoked, does not drink alcohol or use illicit drugs.  Home BP readings: 176/97, 180/104, 152/85, 148/81  Wt Readings from Last 3 Encounters:  01/19/15 210 lb (95.255 kg)  11/04/14 200 lb (90.719 kg)  10/25/14 210 lb (95.255 kg)   BP Readings from Last 3 Encounters:  02/02/15 162/88  01/19/15 168/90  11/04/14 137/57   Pulse Readings from Last 3 Encounters:  02/02/15 70  01/19/15 79  11/04/14 59    Renal function: CrCl cannot be calculated (Unknown ideal weight.).  Past Medical History  Diagnosis Date  . Heart murmur   . Hypercholesteremia   . HTN (hypertension)   . Diastolic CHF ([facility])    [place] echo 12/2010 - nl LVF, mild LAE, MAC with moderately thickened MV leaflets and trivial MR, normal functioning prosthetic AVR with mild increased gradient, grade II diastolic dysfunction   . Hypotension   .  Orthostatic hypotension   . CKD (chronic kidney disease)   . Aortic valve disorder 2009    severe AS s/p AVR with pericardial tissue valve  . Diabetes ([facility])   .Belfryrtic stenosis   . Barrett esophagus   . CAD (coronary artery disease)     s/p 2 vessel CABG with LIMA to LAD, SVG to diagonal 1, SVG to RCA 12/09  . Cardiomyopathy ([facility])   .[place]: rheumatic fever   . Atrial fibrillation ([facility])    [place] op afib with no reoccurence  . Cardiomyopathy ([facility])    [place] Echo 5/16:  mild LVH, EF 30-35%, diff HK, AVR ok, mild MR, severe LAE, mod RAE, reduced RVSF.  Repeat limited echo 10/2014 with EF 25-30%  . History of PFTs     a. Amiodarone started 5/16 >> PFTs w/ DLCO 6/16:  FEV1 72% predicted, FEV1/FVC 66%, DLCO 66% >> minimal reversible obstructive airways disease with mild diffusion defect (suggestive of emphysema but absence of hyperinflation inconsistent with dx)    Current Outpatient Prescriptions on File Prior to Visit  Medication Sig Dispense Refill  . acetaminophen (TYLENOL) 500 MG tablet Take 500 mg by mouth every 6 (six) hours as needed for moderate pain.    .Marland Kitchen  amiodarone (PACERONE) 200 MG tablet Take 1 tablet (200 mg total) by mouth daily. 90 tablet 3  . apixaban (ELIQUIS) 2.5 MG TABS tablet Take 1 tablet (2.5 mg total) by mouth 2 (two) times daily. 180 tablet 3  . atorvastatin (LIPITOR) 20 MG tablet Take 20 mg by mouth daily.    . carvedilol (COREG) 6.25 MG tablet Take 1 tablet (6.25 mg total) by mouth daily. 90 tablet 3  . furosemide (LASIX) 20 MG tablet Take 1 tablet (20 mg total) by mouth every other day. 45 tablet 3  . hydrALAZINE (APRESOLINE) 25 MG tablet Take 1 tablet (25 mg total) by mouth 3 (three) times daily. 270 tablet 3  . HYDROcodone Bitartrate 10 MG C12A Take 1 tablet by mouth daily. Take every day per patient    . isosorbide mononitrate (IMDUR) 30 MG 24 hr tablet Take 1 tablet (30 mg total) by mouth daily. 90 tablet 3  . Magnesium 250 MG TABS Take 1 tablet by mouth daily.       . Multiple Vitamin (MULTIVITAMIN) capsule Take 1 capsule by mouth daily.    Marland Kitchen omega-3 acid ethyl esters (LOVAZA) 1 G capsule Take 2 g by mouth 2 (two) times daily.    . ranitidine (ZANTAC) 300 MG tablet Take 300 mg by mouth at bedtime.    . traMADol (ULTRAM) 50 MG tablet Take 50 mg by mouth every 6 (six) hours as needed for moderate pain or severe pain (pain).     No current facility-administered medications on file prior to visit.    Allergies  Allergen Reactions  . Novocain [Procaine] Swelling     Assessment/Plan:  1. Hypertension - Patient's BP still above goal <150/56mHg despite dose increase of hydralazine 2 weeks ago. Patient used to take lisinopril 452mdaily and BP was controlled but this was stopped in July. He has 4 indications for ACE inhibitor therapy - HFrEF, HTN, CKD + and DM2. Will restart lisinopril at lower dose of 1080maily. BMET being checked today. Of note, patient is still taking K supplementation. Will likely be able to d/c this with ACE inhibitor restart. Will f/u for BP check and repeat BMET in 2 weeks.   Megan E. Supple, PharmD [id number]re[place]ipC [id number]: [phone]: [phone]/26/2016 10:17 AM

## 2015-02-02 NOTE — Patient Instructions (Signed)
Please pick up your prescription for lisinopril 22m once daily and start taking it Continue to check your blood pressure at home and record your readings Follow up again in blood pressure clinic in 2 weeks on Wednesday, November 9 at 10:30am - we will recheck labs at that time

## 2015-02-03 ENCOUNTER — Other Ambulatory Visit: Payer: Self-pay | Admitting: Pharmacist

## 2015-02-08 ENCOUNTER — Telehealth: Payer: Self-pay | Admitting: Cardiology

## 2015-02-08 NOTE — Telephone Encounter (Signed)
-----  Message from Sueanne Margarita, MD sent at 02/06/2015 12:43 PM EDT ----- Persistently reduced LVF with EF 25-30%, stable AVR, mild MR, severely dilated RA.  EF has not improved after 3 months of max med therapy.  Please refer to EP for evaluation of AICD

## 2015-02-08 NOTE — Telephone Encounter (Signed)
New Message  Pt returned call for ECHO results.

## 2015-02-08 NOTE — Telephone Encounter (Signed)
Informed patient of results and verbal understanding expressed.  Message sent to EP scheduler to arrange appointment. Patient agrees with treatment plan.

## 2015-02-16 ENCOUNTER — Encounter: Payer: Self-pay | Admitting: Cardiology

## 2015-02-16 ENCOUNTER — Other Ambulatory Visit (INDEPENDENT_AMBULATORY_CARE_PROVIDER_SITE_OTHER): Payer: Commercial Managed Care - HMO | Admitting: *Deleted

## 2015-02-16 ENCOUNTER — Ambulatory Visit: Payer: Commercial Managed Care - HMO | Admitting: Pharmacist

## 2015-02-16 ENCOUNTER — Encounter: Payer: Self-pay | Admitting: *Deleted

## 2015-02-16 ENCOUNTER — Ambulatory Visit (INDEPENDENT_AMBULATORY_CARE_PROVIDER_SITE_OTHER): Payer: Commercial Managed Care - HMO | Admitting: Pharmacist

## 2015-02-16 ENCOUNTER — Ambulatory Visit (INDEPENDENT_AMBULATORY_CARE_PROVIDER_SITE_OTHER): Payer: Commercial Managed Care - HMO | Admitting: Cardiology

## 2015-02-16 ENCOUNTER — Other Ambulatory Visit: Payer: Self-pay | Admitting: Pharmacist

## 2015-02-16 VITALS — BP 140/78 | HR 66 | Ht 72.0 in | Wt 212.0 lb

## 2015-02-16 DIAGNOSIS — I2583 Coronary atherosclerosis due to lipid rich plaque: Principal | ICD-10-CM

## 2015-02-16 DIAGNOSIS — I2589 Other forms of chronic ischemic heart disease: Secondary | ICD-10-CM | POA: Diagnosis not present

## 2015-02-16 DIAGNOSIS — I5042 Chronic combined systolic (congestive) and diastolic (congestive) heart failure: Secondary | ICD-10-CM

## 2015-02-16 DIAGNOSIS — I420 Dilated cardiomyopathy: Secondary | ICD-10-CM | POA: Diagnosis not present

## 2015-02-16 DIAGNOSIS — I2510 Atherosclerotic heart disease of native coronary artery without angina pectoris: Secondary | ICD-10-CM

## 2015-02-16 DIAGNOSIS — I10 Essential (primary) hypertension: Secondary | ICD-10-CM

## 2015-02-16 DIAGNOSIS — Z01812 Encounter for preprocedural laboratory examination: Secondary | ICD-10-CM | POA: Diagnosis not present

## 2015-02-16 DIAGNOSIS — I25758 Atherosclerosis of native coronary artery of transplanted heart with other forms of angina pectoris: Secondary | ICD-10-CM

## 2015-02-16 DIAGNOSIS — I359 Nonrheumatic aortic valve disorder, unspecified: Secondary | ICD-10-CM | POA: Diagnosis not present

## 2015-02-16 DIAGNOSIS — I4891 Unspecified atrial fibrillation: Secondary | ICD-10-CM | POA: Diagnosis not present

## 2015-02-16 DIAGNOSIS — I255 Ischemic cardiomyopathy: Secondary | ICD-10-CM | POA: Diagnosis not present

## 2015-02-16 DIAGNOSIS — I480 Paroxysmal atrial fibrillation: Secondary | ICD-10-CM | POA: Diagnosis not present

## 2015-02-16 DIAGNOSIS — I350 Nonrheumatic aortic (valve) stenosis: Secondary | ICD-10-CM

## 2015-02-16 DIAGNOSIS — E785 Hyperlipidemia, unspecified: Secondary | ICD-10-CM

## 2015-02-16 LAB — BASIC METABOLIC PANEL
BUN: 18 mg/dL (ref 7–25)
CALCIUM: 8.9 mg/dL (ref 8.6–10.3)
CO2: 25 mmol/L (ref 20–31)
Chloride: 101 mmol/L (ref 98–110)
Creat: 1.55 mg/dL — ABNORMAL HIGH (ref 0.70–1.11)
Glucose, Bld: 131 mg/dL — ABNORMAL HIGH (ref 65–99)
Potassium: 4.4 mmol/L (ref 3.5–5.3)
SODIUM: 133 mmol/L — AB (ref 135–146)

## 2015-02-16 MED ORDER — CARVEDILOL 6.25 MG PO TABS: 6.2500 mg | ORAL_TABLET | Freq: Two times a day (BID) | ORAL | Status: DC

## 2015-02-16 MED ORDER — CARVEDILOL 6.25 MG PO TABS
6.2500 mg | ORAL_TABLET | Freq: Two times a day (BID) | ORAL | Status: DC
Start: 2015-02-16 — End: 2015-11-22

## 2015-02-16 NOTE — Patient Instructions (Addendum)
Start taking your Coreg 6.29m twice a day again Increase your lisinopril to 270monce daily in the evening Cut back on your hydralazine to a half tablet twice a day Ok to start taking furosemide 1055mnce a day instead of 15m72mery other day Continue to write down your blood pressure readings at home and call clinic if you have any problems #[phone] recheck your blood pressure in 1 month on Friday, December 16th at 10am and recheck labs then too

## 2015-02-16 NOTE — Patient Instructions (Signed)
Medication Instructions:  Your physician recommends that you continue on your current medications as directed. Please refer to the Current Medication list given to you today.  Labwork: Pre procedure labs on: 03/25/15  Testing/Procedures: CRT or cardiac resynchronization therapy is a treatment used to correct heart failure. When you have heart failure your heart is weakened and doesn't pump as well as it should. This therapy may help reduce symptoms and improve the quality of life.  Please see the handout/brochure given to you today to get more information of the different options of therapy.  Follow-Up: Your physician recommends that you schedule a wound check appointment in: 10-14 days, after your ICD implant on 12/23, with device clinic.  Your physician recommends that you schedule a follow-up appointment in: 3 months, after your ICD implant on 12/23, with Dr. Curt Bears.   Any Other Special Instructions Will Be Listed Below (If Applicable).  If you need a refill on your cardiac medications before your next appointment, please call your pharmacy.  Thank you for choosing [facility]!!   Trinidad Curet, RN [phone]

## 2015-02-16 NOTE — Progress Notes (Signed)
Electrophysiology Office Note   Date:  02/16/2015   ID:  [name], DOB Mar 04, 1933, MRN [id number]  PCP:  Abigail Miyamoto, MD  Cardiologist:  [name] Primary Electrophysiologist: Ayad Nieman Meredith Leeds, MD    Chief Complaint  Patient presents with  . Atrial Fibrillation  . Coronary Artery Disease     History of Present Illness: [name] is a 81 y.o. male who presents today for electrophysiology evaluation.   He has a history of hypertension, aortic stenosis status post AVR, chronic combined systolic and diastolic CHF in the setting of A. fib with RVR, orthostatic hypotension, dilated cardiomyopathy EF 25-30%, status post CABG, and paroxysmal atrial fibrillation. He has been on optimal medical therapy with Coreg and/or hydralazine and Lasix for the last 3 months. He does have paroxysmal atrial fibrillation has been cardioverted in the past and is now on a fixed within with rhythm control via amiodarone. He says that he does not get very short of breath and is mainly limited by leg pain when he walks. He does say that he thinks that he can climb a flight of stairs but would be short of the top although he tries to avoid stairs as much as he can. He is here immediately with patient is probably 1 is really the probably is plasma: We'll is 13 device and certainly I don't understand what he said last night and was considered units regular formerly worn still working and works in her in a garden as well.   Today, he denies symptoms of palpitations, chest pain, orthopnea, PND, lower extremity edema, claudication, dizziness, presyncope, syncope, bleeding, or neurologic sequela. The patient is tolerating medications without difficulties and is otherwise without complaint today.    Past Medical History  Diagnosis Date  . Heart murmur   . Hypercholesteremia   . HTN (hypertension)   . Diastolic CHF ([place])     2D echo 12/2010 - nl LVF, mild LAE, MAC with moderately thickened MV  leaflets and trivial MR, normal functioning prosthetic AVR with mild increased gradient, grade II diastolic dysfunction   . Hypotension   . Orthostatic hypotension   . CKD (chronic kidney disease)   . Aortic valve disorder 2009    severe AS s/p AVR with pericardial tissue valve  . Diabetes ([name])   . Aortic stenosis   . Barrett esophagus   . CAD (coronary artery disease)     s/p 2 vessel CABG with LIMA to LAD, SVG to diagonal 1, SVG to RCA 12/09  . Cardiomyopathy ([place])   . H/O: rheumatic fever   . Atrial fibrillation ([place])     post op afib with no reoccurence  . Cardiomyopathy ([place])     a. Echo 5/16:  mild LVH, EF 30-35%, diff HK, AVR ok, mild MR, severe LAE, mod RAE, reduced RVSF.  Repeat limited echo 10/2014 with EF 25-30%  . History of PFTs     a. Amiodarone started 5/16 >> PFTs w/ DLCO 6/16:  FEV1 72% predicted, FEV1/FVC 66%, DLCO 66% >> minimal reversible obstructive airways disease with mild diffusion defect (suggestive of emphysema but absence of hyperinflation inconsistent with dx)   Past Surgical History  Procedure Laterality Date  . Coronary artery bypass graft      x 3 Dr. Roxan Hockey  . Avr with pericardial tissue valve    . Intra-ocular lens os dr Anders Simmonds    . Cardioversion N/A 01/27/2013    Procedure: CARDIOVERSION;  Surgeon: Sueanne Margarita, MD;  Location: [facility];  Service: Cardiovascular;  Laterality: N/A;  . Cardioversion N/A 08/25/2014    Procedure: CARDIOVERSION;  Surgeon: Sanda Klein, MD;  Location: [facility];  Service: Cardiovascular;  Laterality: N/A;  . Cardiac catheterization N/A 11/04/2014    Procedure: Left Heart Cath and Cors/Grafts Angiography;  Surgeon: Leonie Man, MD;  Location: [facility];  Service: Cardiovascular;  Laterality: N/A;     Current Outpatient Prescriptions  Medication Sig Dispense Refill  . acetaminophen (TYLENOL) 500 MG tablet Take 500 mg by mouth every 6 (six) hours as needed for moderate pain.    Marland Kitchen amiodarone  (PACERONE) 200 MG tablet Take 1 tablet (200 mg total) by mouth daily. 90 tablet 3  . apixaban (ELIQUIS) 2.5 MG TABS tablet Take 1 tablet (2.5 mg total) by mouth 2 (two) times daily. 180 tablet 3  . atorvastatin (LIPITOR) 20 MG tablet Take 20 mg by mouth daily.    . carvedilol (COREG) 6.25 MG tablet Take 1 tablet (6.25 mg total) by mouth daily. 90 tablet 3  . furosemide (LASIX) 20 MG tablet Take 1 tablet (20 mg total) by mouth every other day. 45 tablet 3  . hydrALAZINE (APRESOLINE) 25 MG tablet Take 1 tablet (25 mg total) by mouth 3 (three) times daily. 270 tablet 3  . isosorbide mononitrate (IMDUR) 30 MG 24 hr tablet Take 1 tablet (30 mg total) by mouth daily. 90 tablet 3  . lisinopril (PRINIVIL,ZESTRIL) 10 MG tablet Take 1 tablet (10 mg total) by mouth daily. 30 tablet 11  . Magnesium 250 MG TABS Take 1 tablet by mouth daily.     . Multiple Vitamin (MULTIVITAMIN) capsule Take 1 capsule by mouth daily.    Marland Kitchen omega-3 acid ethyl esters (LOVAZA) 1 G capsule Take 2 g by mouth 2 (two) times daily.    . ranitidine (ZANTAC) 300 MG tablet Take 300 mg by mouth at bedtime.    . traMADol (ULTRAM) 50 MG tablet Take 50 mg by mouth every 6 (six) hours as needed for moderate pain or severe pain (pain).     No current facility-administered medications for this visit.    Allergies:   Novocain   Social History:  The patient  reports that he has never smoked. He does not have any smokeless tobacco history on file. He reports that he does not drink alcohol or use illicit drugs.   Family History:  The patient's family history includes Alzheimer's disease in his mother and another family member.    ROS:  Please see the history of present illness.   Otherwise, review of systems is positive for shortness of breath with activity.   All other systems are reviewed and negative.    PHYSICAL EXAM: VS:  BP 140/78 mmHg  Pulse 66  Ht 6' (1.829 m)  Wt 212 lb (96.163 kg)  BMI 28.75 kg/m2 , BMI Body mass index is 28.75  kg/(m^2). GEN: Well nourished, well developed, in no acute distress HEENT: normal Neck: no JVD, carotid bruits, or masses Cardiac: RRR; no murmurs, rubs, or gallops,no edema  Respiratory:  clear to auscultation bilaterally, normal work of breathing GI: soft, nontender, nondistended, + BS MS: no deformity or atrophy Skin: warm and dry Neuro:  Strength and sensation are intact Psych: euthymic mood, full affect  EKG:  EKG is ordered today. The ekg ordered today shows sinus rhythm, LBBB, QRS 184 ms, rate 66  Mr. Peterman needs CRT evidencing a 1 and 1 as is tracing is a patient  of yours. As you know telemetry Recent Labs: 08/22/2014: B Natriuretic Peptide 592.3* 08/23/2014: Magnesium 2.2 01/19/2015: ALT 32; Hemoglobin 15.0; Platelets 259; TSH 1.654 02/02/2015: BUN 21; Creat 1.66*; Potassium 4.6; Sodium 135    Lipid Panel     Component Value Date/Time   CHOL 123* 01/19/2015 1005   CHOL 93* 06/16/2014 0914   TRIG 161* 01/19/2015 1005   TRIG 124 06/16/2014 0914   HDL 45 01/19/2015 1005   HDL 34* 06/16/2014 0914   CHOLHDL 2.7 01/19/2015 1005   VLDL 32* 01/19/2015 1005   LDLCALC 46 01/19/2015 1005   LDLCALC 34 06/16/2014 0914     Wt Readings from Last 3 Encounters:  02/16/15 212 lb (96.163 kg)  01/19/15 210 lb (95.255 kg)  11/04/14 200 lb (90.719 kg)      Other studies Reviewed: Additional studies/ records that were reviewed today include: TTE 02/02/15  Review of the above records today demonstrates:  - Left ventricle: The cavity size was mildly dilated. Systolic function was severely reduced. The estimated ejection fraction was in the range of 25% to 30%. There is akinesis of the basal-midinferoseptal myocardium. There is akinesis of the entireapical myocardium. There is akinesis of the mid-apicallateral myocardium. There is akinesis of the mid-apicalinferior myocardium. There is severe hypokinesis of the entireinferolateral myocardium. There is akinesis of  the apicalanterior myocardium. There is akinesis of the midanteroseptal myocardium. Features are consistent with a pseudonormal left ventricular filling pattern, with concomitant abnormal relaxation and increased filling pressure (grade 2 diastolic dysfunction). Doppler parameters are consistent with high ventricular filling pressure. - Aortic valve: Poorly visualized. A bioprosthesis was present and functioning normally. - Mitral valve: Calcified annulus. Mild diffuse calcification of the anterior leaflet. There was mild regurgitation. - Left atrium: The atrium was severely dilated. - Tricuspid valve: There was trivial regurgitation.   ASSESSMENT AND PLAN:  1.  Chronic combined systolic and diastolic congestive heart failure: Has been on Imdur hydralazine and Lasix and beta blocker for the last 3 months. He is not able to tolerate Ace inhibitors due to his CKD. His repeat echo did show an EF of 25-30%. In speaking with him he does have class II heart failure symptoms and would therefore benefit from ICD implantation. I discussed today with him the risks and benefits of the procedure. The risks of the procedure include bleeding, infection, pneumothorax, and tamponade. He has agreed to have the procedure and we Rolonda Pontarelli schedule as soon as possible.  2. Paroxysmal atrial fibrillation: The patient is currently on amiodarone, beta blockers and eliquis for his atrial fibrillation. He has been well controlled on this regimen we Krishawn Vanderweele make no changes today. He has a chads 2 score of 5.  This patients CHA2DS2-VASc Score and unadjusted Ischemic Stroke Rate (% per year) is equal to 7.2 % stroke rate/year from a score of 5  Above score calculated as 1 point each if present [CHF, HTN, DM, Vascular=MI/PAD/Aortic Plaque, Age if 65-74, or Male] Above score calculated as 2 points each if present [Age > 75, or Stroke/TIA/TE]  Current medicines are reviewed at length with the patient today.     The patient does not have concerns regarding his medicines.  The following changes were made today:  none  Labs/ tests ordered today include:  Orders Placed This Encounter  Procedures  . CBC w/Diff  . Basic metabolic panel  . EKG 12-Lead     Disposition:   FU with Lenix Kidd post CRT  Signed, all of Meiko Ives [employer],  MD  02/16/2015 9:31 AM     [facility] [address] [place] [address] [phone] (office) [phone] (fax)

## 2015-02-16 NOTE — Progress Notes (Signed)
Patient ID: [name]                 DOB: [birth year], 81 yo                        MRN: [id number]     HPI: [name] is a 81 y.o. male referred by Dr. Radford Pax to HTN clinic who presents today for follow up. PMH is significant for HTN, chronic combined systolic/diastolic CHF with EF 76-28%, DM, CKD, afib with RVR, and ASCVD s/p CABG. Patient reports that his BP was better controlled over the summer. Of note, he was taking lisinopril 37m daily at that time.This was d/ced in July and BP has been consistently elevated since then despite addition of hydralazine.   At last BP visit, patient was restarted on lisinopril 171mdaily due to 4 indications for ACE inhibitor therapy - HFrEF, HTN, CKD and DM2. Restarted at a lower dose with hope to titrate back up and come off of the hydralazine. Also stopped patient's potassium gluconate supplement when ACEi was restarted. Patient reports that BP readings have overall been better since restarting lisinopril, BP check today was at goal at 140/78, pulse 66.   Patient's home BP readings range 126-175/67-99 with the majority of systolic readings in the 13315Vnd 140s. He has a great grasp over his medications and brings in a notecard with his current medications and home BP readings.  Patient's BP is at goal however he brings up a few potential issues with his dosing today:  1. He used to take Coreg BID but noticed that on his most recent refill, it was written for daily. He noticed this 4 days ago on Sunday and has been taking his Coreg daily since then. His HR has trended up slightly from low 60s to high 60s-low 70s with daily dosing. Will switch back to BID dosing as this change looks like it was made in error.  2. It is hard for him to remember his lunch dose of his hydralazine. This was originally added when his lisinopril was discontinued a few months back. He takes his 3 doses of hydralazine exactly 8 hours apart at 4am, 12pm, and 8pm.   3. Pt  also states it is hard to remember to take his Lasix 2022mvery other day (this was cut back from daily to QOD on 10/12 due to bump in SCr).  Current HTN meds: Coreg 6.22m46mD>>daily this week per instructions on bottle, Lasix 20mg44mry other day, hydralazine 22mg 37m Imdur 30mg d80m, lisinopril 10mg  B22mal: <150/90mmHg. 34mn clinic today 140/78, pulse 70.   Family History: Alzheimer's disease in his mother.  Social History: Patient reports that he has never smoked, does not drink alcohol or use illicit drugs.  Wt Readings from Last 3 Encounters:  02/16/15 212 lb (96.163 kg)  01/19/15 210 lb (95.255 kg)  11/04/14 200 lb (90.719 kg)   BP Readings from Last 3 Encounters:  02/16/15 140/78  02/02/15 150/80  02/02/15 162/88   Pulse Readings from Last 3 Encounters:  02/16/15 66  02/02/15 80  02/02/15 70    Renal function: Estimated Creatinine Clearance: 42.7 mL/min (by C-G formula based on Cr of 1.66).  Past Medical History  Diagnosis Date  . Heart murmur   . Hypercholesteremia   . HTN (hypertension)   . Diastolic CHF ([facility])     [place] 12[place] - nl LVF, mild LAE, MAC with moderately  thickened MV leaflets and trivial MR, normal functioning prosthetic AVR with mild increased gradient, grade II diastolic dysfunction   . Hypotension   . Orthostatic hypotension   . CKD (chronic kidney disease)   . Aortic valve disorder 2009    severe AS s/p AVR with pericardial tissue valve  . Diabetes ([place])   . Aortic stenosis   . Barrett esophagus   . CAD (coronary artery disease)     s/p 2 vessel CABG with LIMA to LAD, SVG to diagonal 1, SVG to RCA 12/09  . Cardiomyopathy ([place])   . H/O: rheumatic fever   . Atrial fibrillation ([place])     post op afib with no reoccurence  . Cardiomyopathy ([place])     a. Echo 5/16:  mild LVH, EF 30-35%, diff HK, AVR ok, mild MR, severe LAE, mod RAE, reduced RVSF.  Repeat limited echo 10/2014 with EF 25-30%  . History of PFTs     a. Amiodarone started 5/16  >> PFTs w/ DLCO 6/16:  FEV1 72% predicted, FEV1/FVC 66%, DLCO 66% >> minimal reversible obstructive airways disease with mild diffusion defect (suggestive of emphysema but absence of hyperinflation inconsistent with dx)    Current Outpatient Prescriptions on File Prior to Visit  Medication Sig Dispense Refill  . acetaminophen (TYLENOL) 500 MG tablet Take 500 mg by mouth every 6 (six) hours as needed for moderate pain.    Marland Kitchen amiodarone (PACERONE) 200 MG tablet Take 1 tablet (200 mg total) by mouth daily. 90 tablet 3  . apixaban (ELIQUIS) 2.5 MG TABS tablet Take 1 tablet (2.5 mg total) by mouth 2 (two) times daily. 180 tablet 3  . atorvastatin (LIPITOR) 20 MG tablet Take 20 mg by mouth daily.    . carvedilol (COREG) 6.25 MG tablet Take 1 tablet (6.25 mg total) by mouth 2 (two) times daily with a meal. 180 tablet 3  . furosemide (LASIX) 20 MG tablet Take 1 tablet (20 mg total) by mouth every other day. 45 tablet 3  . hydrALAZINE (APRESOLINE) 25 MG tablet Take 1 tablet (25 mg total) by mouth 3 (three) times daily. 270 tablet 3  . isosorbide mononitrate (IMDUR) 30 MG 24 hr tablet Take 1 tablet (30 mg total) by mouth daily. 90 tablet 3  . lisinopril (PRINIVIL,ZESTRIL) 10 MG tablet Take 1 tablet (10 mg total) by mouth daily. 30 tablet 11  . Magnesium 250 MG TABS Take 1 tablet by mouth daily.     . Multiple Vitamin (MULTIVITAMIN) capsule Take 1 capsule by mouth daily.    Marland Kitchen omega-3 acid ethyl esters (LOVAZA) 1 G capsule Take 2 g by mouth 2 (two) times daily.    . ranitidine (ZANTAC) 300 MG tablet Take 300 mg by mouth at bedtime.    . traMADol (ULTRAM) 50 MG tablet Take 50 mg by mouth every 6 (six) hours as needed for moderate pain or severe pain (pain).     No current facility-administered medications on file prior to visit.    Allergies  Allergen Reactions  . Novocain [Procaine] Swelling     Assessment/Plan:  1. Hypertension - Patient with BP controlled <150/81mHg with lisinopril restart 2  weeks ago. However, multiple changes made today to help with ease of medication regimen per patient's request. Changed Coreg back to BID as the daily dosing looks like it was made in error and HR well controlled in low 60s when pt took BID per home monitoring. Since pt has trouble with TID dosing of hydralazine, will cut  back to BID (pt had trouble remembering lunch dose and was also timing his TID dosing exactly 8 hours apart which resulted in a dose at 4am) and have pt take 1/2 tablet BID. Will increase lisinopril to 75m daily since cutting back on hydralazine and goal of d/c'ing hydralazine for ease of medication regimen. Also changing Lasix from 236mQOD to 1016maily since this is easier for patient to remember. Checking BMET today with patient's CKD. Pt will continue to monitor and record his BP readings at home. Pt updated his medication card that he brought with him and is very clear on the changes we made to his medications today. Will f/u in BP clinic in 1 month and recheck BMET at that time, pt in agreement with plan.   Megan E. Supple, PharmD [place] [id number]: [phone]: ([phone]/12/2014 1:51 PM

## 2015-02-25 ENCOUNTER — Telehealth: Payer: Self-pay | Admitting: *Deleted

## 2015-02-25 NOTE — Telephone Encounter (Signed)
Called patient to review procedure date/time.  States he was going to call me to reschedule to 04/21/15. Advised patient I would make these changes and call him next week to review new times/appts. Patient verbalized understanding and agreeable to plan.

## 2015-02-28 ENCOUNTER — Encounter: Payer: Self-pay | Admitting: *Deleted

## 2015-02-28 NOTE — Telephone Encounter (Signed)
Re-scheduled CRT-D for 04/21/15. Pre procedure labs on 04/14/15. Wound check on 05/02/15. A new letter of instructions, with new dates/times,  mailed to patient's at home address. Patient verbalized understanding and agreeable to plan.

## 2015-03-07 ENCOUNTER — Encounter: Payer: Self-pay | Admitting: *Deleted

## 2015-03-07 NOTE — Telephone Encounter (Signed)
Informed patient that we could not do procedure on 1/12 and would like to move it to the next day on 1/13. Patient is agreeable and is aware new date/time for procedure is 04/22/15, arrive at hospital at 7:30 a.m.   Advised all other appts would remain as scheduled. He is aware I will mail an updated letter of instructions. Patient verbalized understanding and agreeable to plan.

## 2015-03-25 ENCOUNTER — Other Ambulatory Visit: Payer: Commercial Managed Care - HMO

## 2015-03-25 ENCOUNTER — Ambulatory Visit: Payer: Commercial Managed Care - HMO | Admitting: Pharmacist

## 2015-03-30 NOTE — Telephone Encounter (Signed)
Informed patient of procedure time change. He is to arrive at the hospital at 8 am instead of 7:30. He verbalized understanding and does not need me to mail an updated instruction sheet.

## 2015-04-14 ENCOUNTER — Encounter: Payer: Self-pay | Admitting: Pharmacist

## 2015-04-14 ENCOUNTER — Ambulatory Visit: Payer: Commercial Managed Care - HMO

## 2015-04-14 ENCOUNTER — Ambulatory Visit (INDEPENDENT_AMBULATORY_CARE_PROVIDER_SITE_OTHER): Payer: Commercial Managed Care - HMO | Admitting: Pharmacist

## 2015-04-14 ENCOUNTER — Other Ambulatory Visit (INDEPENDENT_AMBULATORY_CARE_PROVIDER_SITE_OTHER): Payer: Commercial Managed Care - HMO

## 2015-04-14 VITALS — BP 138/84 | HR 68

## 2015-04-14 DIAGNOSIS — I10 Essential (primary) hypertension: Secondary | ICD-10-CM

## 2015-04-14 LAB — CBC WITH DIFFERENTIAL/PLATELET
BASOS ABS: 0 10*3/uL (ref 0.0–0.1)
BASOS PCT: 0 % (ref 0–1)
EOS ABS: 0.1 10*3/uL (ref 0.0–0.7)
Eosinophils Relative: 1 % (ref 0–5)
HCT: 40.1 % (ref 39.0–52.0)
HEMOGLOBIN: 13 g/dL (ref 13.0–17.0)
LYMPHS ABS: 1.5 10*3/uL (ref 0.7–4.0)
Lymphocytes Relative: 18 % (ref 12–46)
MCH: 29 pg (ref 26.0–34.0)
MCHC: 32.4 g/dL (ref 30.0–36.0)
MCV: 89.5 fL (ref 78.0–100.0)
MPV: 9.5 fL (ref 8.6–12.4)
Monocytes Absolute: 1.5 10*3/uL — ABNORMAL HIGH (ref 0.1–1.0)
Monocytes Relative: 18 % — ABNORMAL HIGH (ref 3–12)
NEUTROS ABS: 5.4 10*3/uL (ref 1.7–7.7)
NEUTROS PCT: 63 % (ref 43–77)
PLATELETS: 252 10*3/uL (ref 150–400)
RBC: 4.48 MIL/uL (ref 4.22–5.81)
RDW: 14.6 % (ref 11.5–15.5)
WBC: 8.5 10*3/uL (ref 4.0–10.5)

## 2015-04-14 LAB — BASIC METABOLIC PANEL
BUN: 20 mg/dL (ref 7–25)
CHLORIDE: 100 mmol/L (ref 98–110)
CO2: 23 mmol/L (ref 20–31)
CREATININE: 1.72 mg/dL — AB (ref 0.70–1.11)
Calcium: 8.7 mg/dL (ref 8.6–10.3)
Glucose, Bld: 127 mg/dL — ABNORMAL HIGH (ref 65–99)
Potassium: 4.6 mmol/L (ref 3.5–5.3)
SODIUM: 133 mmol/L — AB (ref 135–146)

## 2015-04-14 NOTE — Progress Notes (Signed)
Patient ID: [name] DOB: 1932/09/14, 82 yo MRN: [id number]     HPI: [name] is a 82 y.o. male referred by Dr. Radford Pax to HTN clinic who presents today for follow up. PMH is significant for HTN, chronic combined systolic/diastolic CHF with EF 49-35%, DM, CKD, afib with RVR, and ASCVD s/p CABG. Patient reports that his BP was better controlled over the summer. Of note, he was taking lisinopril 85m daily at that time.This was d/ced in July and BP was consistently elevated since then despite addition of hydralazine. In October, I restarted pt on lisinopril d/t 4 indications for ACEi therapy (HFrEF, HTN, CKD, DM2). Pt has since been titrated up to 244mdaily of lisinopril. Hydralazine was cut back from TID to BID dosing - patient is very adherent to medications and was waking up at 4am to make sure he took his TID dosing every 8 hours exactly.   Patient today complains that he has been woken up at night about 7 times between Thanksgiving and New Years with SOB due to congestion. When this happens, he takes an extra Lasix which helps to resolve the SOB. He sleeps on a pillow with his bed at an incline. Of note, CKD had previously limited higher doses of Lasix. Today, he reports that he has taken Lasix 2058maily for the past 3 days (usual dose 70m39mily). Of note, he is also having BiV ICD placement next Friday, 04/22/15.   Current BP medications: Coreg 6.25mg46m Hydralazine 12.5mg B80mImdur 30mg d27m Lisinopril 20mg da70mLasix 70mg dai76mHome BP readings have been overall at goal, averaging 140/80s.  Clinic reading at goal <150/90mmHg at70m/84, pulse 68.  Family History: Alzheimer's disease in his mother.  Social History: Patient reports that he has never smoked, does not drink alcohol or use illicit drugs.  Past Medical History  Diagnosis Date  . Heart murmur   . Hypercholesteremia   . HTN (hypertension)   . Diastolic CHF ([facility])    [place] 12/[place] - nl LVF, mild LAE, MAC with moderately thickened MV leaflets and trivial MR, normal functioning prosthetic AVR with mild increased gradient, grade II diastolic dysfunction   . Hypotension   . Orthostatic hypotension   . CKD (chronic kidney disease)   . Aortic valve disorder 2009    severe AS s/p AVR with pericardial tissue valve  . Diabetes ([facility])   . AAmelia Court Houseic stenosis   . Barrett esophagus   . CAD (coronary artery disease)     s/p 2 vessel CABG with LIMA to LAD, SVG to diagonal 1, SVG to RCA 12/09  . Cardiomyopathy ([facility])   . [place] rheumatic fever   . Atrial fibrillation ([facility])     [place] af[place] with no reoccurence  . Cardiomyopathy ([facility])     [place] 5/16:  mild LVH, EF 30-35%, diff HK, AVR ok, mild MR, severe LAE, mod RAE, reduced RVSF.  Repeat limited echo 10/2014 with EF 25-30%  . History of PFTs     a. Amiodarone started 5/16 >> PFTs w/ DLCO 6/16:  FEV1 72% predicted, FEV1/FVC 66%, DLCO 66% >> minimal reversible obstructive airways disease with mild diffusion defect (suggestive of emphysema but absence of hyperinflation inconsistent with dx)    Current Outpatient Prescriptions on File Prior to Visit  Medication Sig Dispense Refill  . acetaminophen (TYLENOL) 500 MG tablet Take 500 mg by mouth every 6 (six) hours as needed for moderate pain.    . amiodaroMarland Kitchene (PACERONE)  200 MG tablet Take 1 tablet (200 mg total) by mouth daily. 90 tablet 3  . apixaban (ELIQUIS) 2.5 MG TABS tablet Take 1 tablet (2.5 mg total) by mouth 2 (two) times daily. 180 tablet 3  . atorvastatin (LIPITOR) 20 MG tablet Take 20 mg by mouth daily.    . carvedilol (COREG) 6.25 MG tablet Take 1 tablet (6.25 mg total) by mouth 2 (two) times daily with a meal. 180 tablet 3  . furosemide (LASIX) 20 MG tablet Take 0.5 tablets (10 mg total) by mouth daily. 45 tablet 3  . hydrALAZINE (APRESOLINE) 25 MG tablet Take 0.5 tablets (12.5 mg total) by mouth 2 (two) times daily before a meal. 270 tablet 3  . isosorbide  mononitrate (IMDUR) 30 MG 24 hr tablet Take 1 tablet (30 mg total) by mouth daily. 90 tablet 3  . lisinopril (PRINIVIL,ZESTRIL) 10 MG tablet Take 2 tablets (20 mg total) by mouth daily. 30 tablet 11  . Magnesium 250 MG TABS Take 1 tablet by mouth daily.     . Multiple Vitamin (MULTIVITAMIN) capsule Take 1 capsule by mouth daily.    Marland Kitchen omega-3 acid ethyl esters (LOVAZA) 1 G capsule Take 2 g by mouth 2 (two) times daily.    . ranitidine (ZANTAC) 300 MG tablet Take 300 mg by mouth at bedtime.    . traMADol (ULTRAM) 50 MG tablet Take 50 mg by mouth every 6 (six) hours as needed for moderate pain or severe pain (pain).     No current facility-administered medications on file prior to visit.    Allergies  Allergen Reactions  . Novocain [Procaine] Swelling    Assessment/Plan:  1. Hypertension - BP at goal <150/62mHg, however pt has been increasingly symptomatic with fluid collection around his lungs due to his heart failure. Rechecked BMET today since pt has CKD and has been taking more Lasix than usual - SCr has bumped from 1.55 to 1.72. Will have patient return to taking Lasix 123mdaily. Anticipate that BiV ICD insertion next week will help with congestion symptoms. Will have patient monitor symptoms and follow up post-procedure as scheduled.   Coline Calkin E. Jennifier Smitherman, PharmD [place]. [address]GrRichmondNC [id number]: [phone]: [phone]/08/2015 11:05 AM

## 2015-04-14 NOTE — Addendum Note (Signed)
Addended by: Eulis Foster on: 04/14/2015 10:16 AM   Modules accepted: Orders

## 2015-04-22 ENCOUNTER — Ambulatory Visit (HOSPITAL_COMMUNITY)
Admission: RE | Admit: 2015-04-22 | Discharge: 2015-04-23 | Disposition: A | Discharge status: Home / Self Care | Payer: Commercial Managed Care - HMO | Source: Ambulatory Visit | Attending: Cardiology | Admitting: Cardiology

## 2015-04-22 ENCOUNTER — Encounter (HOSPITAL_COMMUNITY)
Admission: RE | Disposition: A | Discharge status: Home / Self Care | Payer: Self-pay | Source: Ambulatory Visit | Attending: Cardiology

## 2015-04-22 ENCOUNTER — Encounter (HOSPITAL_COMMUNITY): Payer: Self-pay | Admitting: General Practice

## 2015-04-22 DIAGNOSIS — Z951 Presence of aortocoronary bypass graft: Secondary | ICD-10-CM | POA: Diagnosis not present

## 2015-04-22 DIAGNOSIS — I130 Hypertensive heart and chronic kidney disease with heart failure and stage 1 through stage 4 chronic kidney disease, or unspecified chronic kidney disease: Secondary | ICD-10-CM | POA: Insufficient documentation

## 2015-04-22 DIAGNOSIS — I509 Heart failure, unspecified: Secondary | ICD-10-CM | POA: Diagnosis present

## 2015-04-22 DIAGNOSIS — I255 Ischemic cardiomyopathy: Secondary | ICD-10-CM | POA: Diagnosis not present

## 2015-04-22 DIAGNOSIS — Z952 Presence of prosthetic heart valve: Secondary | ICD-10-CM | POA: Insufficient documentation

## 2015-04-22 DIAGNOSIS — N189 Chronic kidney disease, unspecified: Secondary | ICD-10-CM | POA: Insufficient documentation

## 2015-04-22 DIAGNOSIS — I5042 Chronic combined systolic (congestive) and diastolic (congestive) heart failure: Secondary | ICD-10-CM | POA: Insufficient documentation

## 2015-04-22 DIAGNOSIS — I447 Left bundle-branch block, unspecified: Secondary | ICD-10-CM | POA: Diagnosis not present

## 2015-04-22 DIAGNOSIS — I420 Dilated cardiomyopathy: Secondary | ICD-10-CM | POA: Diagnosis not present

## 2015-04-22 DIAGNOSIS — Z95818 Presence of other cardiac implants and grafts: Secondary | ICD-10-CM

## 2015-04-22 DIAGNOSIS — Z01812 Encounter for preprocedural laboratory examination: Secondary | ICD-10-CM

## 2015-04-22 DIAGNOSIS — E7800 Pure hypercholesterolemia, unspecified: Secondary | ICD-10-CM | POA: Diagnosis not present

## 2015-04-22 DIAGNOSIS — I2510 Atherosclerotic heart disease of native coronary artery without angina pectoris: Secondary | ICD-10-CM | POA: Insufficient documentation

## 2015-04-22 DIAGNOSIS — I480 Paroxysmal atrial fibrillation: Secondary | ICD-10-CM | POA: Insufficient documentation

## 2015-04-22 DIAGNOSIS — I10 Essential (primary) hypertension: Secondary | ICD-10-CM | POA: Diagnosis present

## 2015-04-22 DIAGNOSIS — Z7901 Long term (current) use of anticoagulants: Secondary | ICD-10-CM

## 2015-04-22 DIAGNOSIS — I5022 Chronic systolic (congestive) heart failure: Secondary | ICD-10-CM | POA: Diagnosis not present

## 2015-04-22 DIAGNOSIS — E1122 Type 2 diabetes mellitus with diabetic chronic kidney disease: Secondary | ICD-10-CM | POA: Insufficient documentation

## 2015-04-22 HISTORY — PX: EP IMPLANTABLE DEVICE: SHX172B

## 2015-04-22 HISTORY — PX: BI-VENTRICULAR IMPLANTABLE CARDIOVERTER DEFIBRILLATOR  (CRT-D): SHX5747

## 2015-04-22 HISTORY — DX: Unspecified osteoarthritis, unspecified site: M19.90

## 2015-04-22 HISTORY — DX: Unspecified asthma, uncomplicated: J45.909

## 2015-04-22 HISTORY — DX: Gastro-esophageal reflux disease without esophagitis: K21.9

## 2015-04-22 HISTORY — DX: Personal history of other diseases of the digestive system: Z87.19

## 2015-04-22 HISTORY — DX: Pneumonia, unspecified organism: J18.9

## 2015-04-22 HISTORY — DX: Inflammatory liver disease, unspecified: K75.9

## 2015-04-22 LAB — SURGICAL PCR SCREEN
MRSA, PCR: POSITIVE — AB
Staphylococcus aureus: POSITIVE — AB

## 2015-04-22 SURGERY — BIV ICD INSERTION CRT-D

## 2015-04-22 MED ORDER — MUPIROCIN 2 % EX OINT: 1.0000 "application " | TOPICAL_OINTMENT | Freq: Two times a day (BID) | CUTANEOUS | Status: DC

## 2015-04-22 MED ORDER — OXYCODONE HCL 5 MG PO TABS: 5.0000 mg | ORAL_TABLET | ORAL | Status: DC | PRN

## 2015-04-22 MED ORDER — MUPIROCIN 2 % EX OINT
TOPICAL_OINTMENT | Freq: Two times a day (BID) | CUTANEOUS | Status: DC
Start: 2015-04-22 — End: 2015-04-23
  Administered 2015-04-22 – 2015-04-23 (×3): via NASAL

## 2015-04-22 MED ORDER — ACETAMINOPHEN 325 MG PO TABS: 325.0000 mg | ORAL_TABLET | ORAL | Status: DC | PRN

## 2015-04-22 MED ORDER — MAGNESIUM 250 MG PO TABS: 1.0000 | ORAL_TABLET | Freq: Every day | ORAL | Status: DC

## 2015-04-22 MED ORDER — HEPARIN (PORCINE) IN NACL 2-0.9 UNIT/ML-% IJ SOLN
INTRAMUSCULAR | Status: AC
  Filled 2015-04-22: qty 500

## 2015-04-22 MED ORDER — FUROSEMIDE 20 MG PO TABS
10.0000 mg | ORAL_TABLET | Freq: Every day | ORAL | Status: DC
  Administered 2015-04-22 – 2015-04-23 (×2): 10 mg via ORAL
  Filled 2015-04-22 (×2): qty 1

## 2015-04-22 MED ORDER — CHLORHEXIDINE GLUCONATE CLOTH 2 % EX PADS
6.0000 | MEDICATED_PAD | Freq: Every day | CUTANEOUS | Status: DC
  Administered 2015-04-23: 06:00:00 6 via TOPICAL

## 2015-04-22 MED ORDER — FENTANYL CITRATE (PF) 100 MCG/2ML IJ SOLN
INTRAMUSCULAR | Status: AC
  Filled 2015-04-22: qty 2

## 2015-04-22 MED ORDER — SODIUM CHLORIDE 0.9 % IR SOLN
80.0000 mg | Status: AC
  Administered 2015-04-22: 80 mg
  Filled 2015-04-22: qty 2

## 2015-04-22 MED ORDER — YOU HAVE A PACEMAKER BOOK
Freq: Once | Status: AC
  Administered 2015-04-22: 20:00:00
  Filled 2015-04-22: qty 1

## 2015-04-22 MED ORDER — CEFAZOLIN SODIUM-DEXTROSE 2-3 GM-% IV SOLR
INTRAVENOUS | Status: AC
  Filled 2015-04-22: qty 50

## 2015-04-22 MED ORDER — SODIUM CHLORIDE 0.9 % IR SOLN
Status: AC
  Filled 2015-04-22: qty 2

## 2015-04-22 MED ORDER — ADULT MULTIVITAMIN W/MINERALS CH
1.0000 | ORAL_TABLET | Freq: Every day | ORAL | Status: DC
Start: 2015-04-23 — End: 2015-04-23
  Administered 2015-04-23: 10:00:00 1 via ORAL
  Filled 2015-04-22 (×2): qty 1

## 2015-04-22 MED ORDER — BUPIVACAINE HCL (PF) 0.25 % IJ SOLN
INTRAMUSCULAR | Status: DC | PRN
  Administered 2015-04-22: 31 mL

## 2015-04-22 MED ORDER — ONDANSETRON HCL 4 MG/2ML IJ SOLN: 4.0000 mg | Freq: Four times a day (QID) | INTRAMUSCULAR | Status: DC | PRN

## 2015-04-22 MED ORDER — AMIODARONE HCL 200 MG PO TABS
200.0000 mg | ORAL_TABLET | Freq: Every day | ORAL | Status: DC
  Administered 2015-04-23: 200 mg via ORAL
  Filled 2015-04-22 (×2): qty 1

## 2015-04-22 MED ORDER — APIXABAN 5 MG PO TABS
2.5000 mg | ORAL_TABLET | Freq: Two times a day (BID) | ORAL | Status: DC
  Administered 2015-04-23: 10:00:00 2.5 mg via ORAL
  Filled 2015-04-22: qty 0.5

## 2015-04-22 MED ORDER — IOHEXOL 350 MG/ML SOLN
INTRAVENOUS | Status: DC | PRN
  Administered 2015-04-22: 30 mL via INTRACARDIAC

## 2015-04-22 MED ORDER — CEFAZOLIN SODIUM-DEXTROSE 2-3 GM-% IV SOLR
2.0000 g | INTRAVENOUS | Status: AC
  Administered 2015-04-22: 2 g via INTRAVENOUS

## 2015-04-22 MED ORDER — SODIUM CHLORIDE 0.9 % IV SOLN
INTRAVENOUS | Status: DC
  Administered 2015-04-22: 09:00:00 via INTRAVENOUS

## 2015-04-22 MED ORDER — MUPIROCIN 2 % EX OINT
TOPICAL_OINTMENT | CUTANEOUS | Status: AC
  Filled 2015-04-22: qty 22

## 2015-04-22 MED ORDER — LISINOPRIL 10 MG PO TABS
20.0000 mg | ORAL_TABLET | Freq: Every day | ORAL | Status: DC
  Administered 2015-04-22 – 2015-04-23 (×2): 20 mg via ORAL
  Filled 2015-04-22 (×2): qty 2

## 2015-04-22 MED ORDER — ISOSORBIDE MONONITRATE ER 30 MG PO TB24
30.0000 mg | ORAL_TABLET | Freq: Every day | ORAL | Status: DC
  Administered 2015-04-23: 10:00:00 30 mg via ORAL
  Filled 2015-04-22 (×2): qty 1

## 2015-04-22 MED ORDER — ATORVASTATIN CALCIUM 20 MG PO TABS
20.0000 mg | ORAL_TABLET | Freq: Every day | ORAL | Status: DC
  Administered 2015-04-22 – 2015-04-23 (×2): 20 mg via ORAL
  Filled 2015-04-22 (×2): qty 1

## 2015-04-22 MED ORDER — FAMOTIDINE 20 MG PO TABS
20.0000 mg | ORAL_TABLET | Freq: Every day | ORAL | Status: DC
  Administered 2015-04-22 – 2015-04-23 (×2): 20 mg via ORAL
  Filled 2015-04-22 (×2): qty 1

## 2015-04-22 MED ORDER — BUPIVACAINE HCL (PF) 0.25 % IJ SOLN
INTRAMUSCULAR | Status: AC
  Filled 2015-04-22: qty 60

## 2015-04-22 MED ORDER — ACETAMINOPHEN 500 MG PO TABS: 500.0000 mg | ORAL_TABLET | Freq: Four times a day (QID) | ORAL | Status: DC | PRN

## 2015-04-22 MED ORDER — OMEGA-3-ACID ETHYL ESTERS 1 G PO CAPS
2.0000 g | ORAL_CAPSULE | Freq: Two times a day (BID) | ORAL | Status: DC
  Administered 2015-04-22 – 2015-04-23 (×2): 2 g via ORAL
  Filled 2015-04-22 (×2): qty 2

## 2015-04-22 MED ORDER — HYDRALAZINE HCL 25 MG PO TABS
12.5000 mg | ORAL_TABLET | Freq: Two times a day (BID) | ORAL | Status: DC
  Administered 2015-04-22 – 2015-04-23 (×2): 12.5 mg via ORAL
  Filled 2015-04-22 (×2): qty 1

## 2015-04-22 MED ORDER — MIDAZOLAM HCL 5 MG/5ML IJ SOLN
INTRAMUSCULAR | Status: DC | PRN
  Administered 2015-04-22 (×3): 1 mg via INTRAVENOUS
  Administered 2015-04-22: 2 mg via INTRAVENOUS
  Administered 2015-04-22: 1 mg via INTRAVENOUS

## 2015-04-22 MED ORDER — FENTANYL CITRATE (PF) 100 MCG/2ML IJ SOLN
INTRAMUSCULAR | Status: DC | PRN
  Administered 2015-04-22 (×3): 25 ug via INTRAVENOUS

## 2015-04-22 MED ORDER — HEPARIN (PORCINE) IN NACL 2-0.9 UNIT/ML-% IJ SOLN
INTRAMUSCULAR | Status: DC | PRN
  Administered 2015-04-22: 11:00:00

## 2015-04-22 MED ORDER — CEFAZOLIN SODIUM-DEXTROSE 2-3 GM-% IV SOLR
2.0000 g | Freq: Three times a day (TID) | INTRAVENOUS | Status: AC
  Administered 2015-04-22 – 2015-04-23 (×3): 2 g via INTRAVENOUS
  Filled 2015-04-22 (×5): qty 50

## 2015-04-22 MED ORDER — CARVEDILOL 3.125 MG PO TABS
6.2500 mg | ORAL_TABLET | Freq: Two times a day (BID) | ORAL | Status: DC
  Administered 2015-04-22 – 2015-04-23 (×2): 6.25 mg via ORAL
  Filled 2015-04-22 (×2): qty 2

## 2015-04-22 MED ORDER — MIDAZOLAM HCL 5 MG/5ML IJ SOLN
INTRAMUSCULAR | Status: AC
  Filled 2015-04-22: qty 5

## 2015-04-22 SURGICAL SUPPLY — 26 items
BALLN ATTAIN 80 (BALLOONS) ×2
BALLN ATTAIN 80CM 6215 (BALLOONS) ×1
BALLOON ATTAIN 80 (BALLOONS) IMPLANT
CABLE SURGICAL S-101-97-12 (CABLE) ×2 IMPLANT
CATH ATTAIN COM SURV 6250V-EH (CATHETERS) ×2 IMPLANT
CATH ATTAIN COM SURV 6250V-MB2 (CATHETERS) ×2 IMPLANT
CATH ATTAIN SELECT 6238TEL (CATHETERS) ×2 IMPLANT
CATH CPS DIRECT 135 DS2C020 (CATHETERS) ×2 IMPLANT
CATH HEX JOSEPH 2-5-2 65CM 6F (CATHETERS) ×2 IMPLANT
GUIDEWIRE ANGLED .035X150CM (WIRE) ×2 IMPLANT
ICD VIVA QUAD XT CRT-D DTBA1QQ (ICD Generator) ×2 IMPLANT
KIT ESSENTIALS PG (KITS) ×2 IMPLANT
LEAD ATTAIN PERFOMA 4298-88CM (Lead) ×2 IMPLANT
LEAD CAPSURE NOVUS 5076-52CM (Lead) ×2 IMPLANT
LEAD SPRINT QUAT SEC 6935M-62 (Lead) ×2 IMPLANT
PAD DEFIB LIFELINK (PAD) ×2 IMPLANT
SHEATH CLASSIC 7F (SHEATH) ×2 IMPLANT
SHEATH CLASSIC 9.5F (SHEATH) ×2 IMPLANT
SHEATH CLASSIC 9F (SHEATH) ×2 IMPLANT
SHIELD RADPAD SCOOP 12X17 (MISCELLANEOUS) ×2 IMPLANT
SLITTER UNIVERSAL DS2A003 (MISCELLANEOUS) ×2 IMPLANT
TRAY PACEMAKER INSERTION (PACKS) ×2 IMPLANT
WIRE ACUITY WHISPER EDS 4648 (WIRE) ×4 IMPLANT
WIRE HI TORQ VERSACORE-J 145CM (WIRE) ×2 IMPLANT
WIRE LUGE 182CM (WIRE) ×2 IMPLANT
WIRE PT2 MS 185 (WIRE) ×2 IMPLANT

## 2015-04-22 NOTE — H&P (Signed)
Date:  04/22/2015   ID:  [name], DOB Feb 19, 1933, MRN [id number]  PCP:  [place], BETTY G, MD   Primary Electrophysiologist:  Constance Haw, MD    No chief complaint on file.    History of Present Illness: [name] is a 82 y.o. male who presents today for electrophysiology evaluation.   He has a history of hypertension, aortic stenosis status post AVR, chronic combined systolic and diastolic CHF in the setting of A. fib with RVR, orthostatic hypotension, dilated cardiomyopathy EF 25-30%, status post CABG, and paroxysmal atrial fibrillation.  He presents today for CRT-D placement.   Today, he denies symptoms of palpitations, chest pain, shortness of breath, orthopnea, PND, lower extremity edema, claudication, dizziness, presyncope, syncope, bleeding, or neurologic sequela. The patient is tolerating medications without difficulties and is otherwise without complaint today.    Past Medical History  Diagnosis Date  . Heart murmur   . Hypercholesteremia   . HTN (hypertension)   . Diastolic CHF ([place])     2D echo 12/2010 - nl LVF, mild LAE, MAC with moderately thickened MV leaflets and trivial MR, normal functioning prosthetic AVR with mild increased gradient, grade II diastolic dysfunction   . Hypotension   . Orthostatic hypotension   . CKD (chronic kidney disease)   . Aortic valve disorder 2009    severe AS s/p AVR with pericardial tissue valve  . Diabetes ([place])   . Aortic stenosis   . Barrett esophagus   . CAD (coronary artery disease)     s/p 2 vessel CABG with LIMA to LAD, SVG to diagonal 1, SVG to RCA 12/09  . Cardiomyopathy ([place])   . H/O: rheumatic fever   . Atrial fibrillation ([place])     post op afib with no reoccurence  . Cardiomyopathy ([place])     a. Echo 5/16:  mild LVH, EF 30-35%, diff HK, AVR ok, mild MR, severe LAE, mod RAE, reduced RVSF.  Repeat limited echo 10/2014 with EF 25-30%  . History of PFTs     a. Amiodarone started 5/16 >> PFTs w/ DLCO  6/16:  FEV1 72% predicted, FEV1/FVC 66%, DLCO 66% >> minimal reversible obstructive airways disease with mild diffusion defect (suggestive of emphysema but absence of hyperinflation inconsistent with dx)   Past Surgical History  Procedure Laterality Date  . Coronary artery bypass graft      x 3 Dr. Roxan Hockey  . Avr with pericardial tissue valve    . Intra-ocular lens os dr Anders Simmonds    . Cardioversion N/A 01/27/2013    Procedure: CARDIOVERSION;  Surgeon: Sueanne Margarita, MD;  Location: [facility];  Service: Cardiovascular;  Laterality: N/A;  . Cardioversion N/A 08/25/2014    Procedure: CARDIOVERSION;  Surgeon: Sanda Klein, MD;  Location: [facility];  Service: Cardiovascular;  Laterality: N/A;  . Cardiac catheterization N/A 11/04/2014    Procedure: Left Heart Cath and Cors/Grafts Angiography;  Surgeon: Leonie Man, MD;  Location: [facility];  Service: Cardiovascular;  Laterality: N/A;     Current Facility-Administered Medications  Medication Dose Route Frequency Provider Last Rate Last Dose  . 0.9 %  sodium chloride infusion   Intravenous Continuous Shantanu Strauch Meredith Leeds, MD 50 mL/hr at 04/22/15 0840    . ceFAZolin (ANCEF) IVPB 2 g/50 mL premix  2 g Intravenous On Call Hendrix Console Meredith Leeds, MD      . gentamicin (GARAMYCIN) 80 mg in sodium chloride irrigation 0.9 % 500 mL irrigation  80 mg Irrigation On Call Courtenay Hirth Meredith Leeds, MD      . mupirocin ointment (BACTROBAN) 2 %   Nasal BID Josue Falconi Meredith Leeds, MD        Allergies:   Novocain   Social History:  The patient  reports that he has never smoked. He does not have any smokeless tobacco history on file. He reports that he does not drink alcohol or use illicit drugs.   Family History:  The patient's family history includes Alzheimer's disease in his mother.    ROS:  Please see the history of present illness.   All other systems are reviewed and negative.    PHYSICAL EXAM: VS:  BP 152/82 mmHg  Pulse 69  Temp(Src)  97.6 F (36.4 C) (Oral)  Resp 16  Ht 6' (1.829 m)  Wt 205 lb (92.987 kg)  BMI 27.80 kg/m2  SpO2 98% , BMI Body mass index is 27.8 kg/(m^2). GEN: Well nourished, well developed, in no acute distress HEENT: normal Neck: no JVD, carotid bruits, or masses Cardiac: RRR; no murmurs, rubs, or gallops,no edema  Respiratory:  clear to auscultation bilaterally, normal work of breathing GI: soft, nontender, nondistended, + BS MS: no deformity or atrophy Skin: warm and dry Neuro:  Strength and sensation are intact Psych: euthymic mood, full affect  Recent Labs: 08/22/2014: B Natriuretic Peptide 592.3* 08/23/2014: Magnesium 2.2 01/19/2015: ALT 32; TSH 1.654 04/14/2015: BUN 20; Creat 1.72*; Hemoglobin 13.0; Platelets 252; Potassium 4.6; Sodium 133*    Lipid Panel     Component Value Date/Time   CHOL 123* 01/19/2015 1005   CHOL 93* 06/16/2014 0914   TRIG 161* 01/19/2015 1005   TRIG 124 06/16/2014 0914   HDL 45 01/19/2015 1005   HDL 34* 06/16/2014 0914   CHOLHDL 2.7 01/19/2015 1005   VLDL 32* 01/19/2015 1005   LDLCALC 46 01/19/2015 1005   LDLCALC 34 06/16/2014 0914     Wt Readings from Last 3 Encounters:  04/22/15 205 lb (92.987 kg)  02/16/15 212 lb (96.163 kg)  01/19/15 210 lb (95.255 kg)    ASSESSMENT AND PLAN:  1.  Chronic combined systolic and disatolic heart failure: Plan for CRT-D today.  Discussed the procedure with the patient including risks and benefits.  Risks include bleeding, infection, tamponade, pneumothorax.  The patient understands these risks and we Jameon Deller proceed with the procedure.     Labs/ tests ordered today include:  Orders Placed This Encounter  Procedures  . Surgical pcr screen  . Diet NPO time specified Except for: Sips with Meds  . Informed consent details: transcribe and obtain patient signature  . Electrode Placement  . For ICD and ICD generator change patients, confirm EKG has been done in the past 30 days.  If not place order and obtain test.  . If  patient on heparin, discontinue at 0300 day of procedure  . If patient on Lovenox, discontinue at midnight the night before procedure  . Use clippers to remove hair, entire chest area  . Verify informed consent  . Void on call to EP Lab  . Lab instructions  . AM of procedure hold 70/30 insulin dose (if ordered)  . AM of procedure hold oral hypoglycemic agents (if ordered)  . If patient on AM basal insulin: AM ON DAY OF PROCEDURE:  Give 1/2 basal dose (Lantus, Levemir, or NPH)  . EP PPM/ICD IMPLANT  . EKG 12-Lead   Signed, Harsha Yusko Meredith Leeds, MD  04/22/2015 10:00 AM     [facility]  [address] [place] [address] [phone] (office) [phone] (fax)

## 2015-04-22 NOTE — Progress Notes (Signed)
ICD Criteria  Current LVEF:25-30%. Within 12 months prior to implant: Yes   Heart failure history: Yes, Class II  Cardiomyopathy history: Yes, Mixed Ischemic and Non-Ischemic Cardiomyopathies.  Atrial Fibrillation/Atrial Flutter: Yes, Paroxysmal.  Ventricular tachycardia history: No.  Cardiac arrest history: No.  History of syndromes with risk of sudden death: No.  Previous ICD: No.  Current ICD indication: Primary  PPM indication: No.   Class I or II Bradycardia indication present: No  Beta Blocker therapy for 3 or more months: Yes, prescribed.   Ace Inhibitor/ARB therapy for 3 or more months: No, medical reason.

## 2015-04-23 ENCOUNTER — Ambulatory Visit (HOSPITAL_COMMUNITY): Payer: Commercial Managed Care - HMO

## 2015-04-23 DIAGNOSIS — I255 Ischemic cardiomyopathy: Secondary | ICD-10-CM | POA: Diagnosis not present

## 2015-04-23 DIAGNOSIS — I420 Dilated cardiomyopathy: Secondary | ICD-10-CM | POA: Diagnosis not present

## 2015-04-23 DIAGNOSIS — Z95810 Presence of automatic (implantable) cardiac defibrillator: Secondary | ICD-10-CM | POA: Diagnosis not present

## 2015-04-23 DIAGNOSIS — I447 Left bundle-branch block, unspecified: Secondary | ICD-10-CM | POA: Diagnosis not present

## 2015-04-23 DIAGNOSIS — I5022 Chronic systolic (congestive) heart failure: Secondary | ICD-10-CM | POA: Diagnosis not present

## 2015-04-23 DIAGNOSIS — N189 Chronic kidney disease, unspecified: Secondary | ICD-10-CM | POA: Diagnosis not present

## 2015-04-23 DIAGNOSIS — I5042 Chronic combined systolic (congestive) and diastolic (congestive) heart failure: Secondary | ICD-10-CM | POA: Diagnosis not present

## 2015-04-23 DIAGNOSIS — E1122 Type 2 diabetes mellitus with diabetic chronic kidney disease: Secondary | ICD-10-CM | POA: Diagnosis not present

## 2015-04-23 DIAGNOSIS — I480 Paroxysmal atrial fibrillation: Secondary | ICD-10-CM | POA: Diagnosis not present

## 2015-04-23 DIAGNOSIS — I130 Hypertensive heart and chronic kidney disease with heart failure and stage 1 through stage 4 chronic kidney disease, or unspecified chronic kidney disease: Secondary | ICD-10-CM | POA: Diagnosis not present

## 2015-04-23 DIAGNOSIS — I2510 Atherosclerotic heart disease of native coronary artery without angina pectoris: Secondary | ICD-10-CM | POA: Diagnosis not present

## 2015-04-23 NOTE — Discharge Instructions (Signed)
° ° °  Supplemental Discharge Instructions for  Pacemaker/Defibrillator Patients  Activity No heavy lifting or vigorous activity with your left/right arm for 6 to 8 weeks.  Do not raise your left/right arm above your head for one week.  Gradually raise your affected arm as drawn below.           __  NO DRIVING for one week ; you may begin driving on  Apr 28, 5196.  WOUND CARE - Keep the wound area clean and dry.  Do not get this area wet for one week. No showers for one week; you may shower on  Apr 29, 2015   . - The tape/steri-strips on your wound will fall off; do not pull them off.  No bandage is needed on the site.  DO  NOT apply any creams, oils, or ointments to the wound area. - If you notice any drainage or discharge from the wound, any swelling or bruising at the site, or you develop a fever > 101? F after you are discharged home, call the office at once.  Special Instructions - You are still able to use cellular telephones; use the ear opposite the side where you have your pacemaker/defibrillator.  Avoid carrying your cellular phone near your device. - When traveling through airports, show security personnel your identification card to avoid being screened in the metal detectors.  Ask the security personnel to use the hand wand. - Avoid arc welding equipment, MRI testing (magnetic resonance imaging), TENS units (transcutaneous nerve stimulators).  Call the office for questions about other devices. - Avoid electrical appliances that are in poor condition or are not properly grounded. - Microwave ovens are safe to be near or to operate.  Additional information for defibrillator patients should your device go off: - If your device goes off ONCE and you feel fine afterward, notify the device clinic nurses. - If your device goes off ONCE and you do not feel well afterward, call 911. - If your device goes off TWICE, call 911. - If your device goes off THREE times in one day, call  911.  DO NOT DRIVE YOURSELF OR A FAMILY MEMBER WITH A DEFIBRILLATOR TO THE HOSPITAL--CALL 911.

## 2015-04-23 NOTE — Discharge Summary (Signed)
Physician Discharge Summary   Cardiologist:  Camn[place]  Patient ID: [name] MRN: [id number] DOB/AGE: 1932-10-06 82 y.o.  Admit date: 04/22/2015 Discharge date: 04/23/2015  Admission Diagnoses:  Cardiomyopathy, ischemic: EF 25-30%  Discharge Diagnoses:  Active Problems:   Essential hypertension   Chronic anticoagulation   Cardiomyopathy, ischemic: EF roughly 30%   CHF (congestive heart failure) [facility])   Discharged Condition: stable  Hospital Course:   [name] is a 82 y.o. male who presented for electrophysiology evaluation. He has a history of hypertension, aortic stenosis status post AVR, chronic combined systolic and diastolic CHF in the setting of A. fib with RVR, orthostatic hypotension, dilated cardiomyopathy EF 25-30%, status post CABG, and paroxysmal atrial fibrillation. He presented for CRT-D placement. He denied symptoms of palpitations, chest pain, shortness of breath, orthopnea, PND, lower extremity edema, claudication, dizziness, presyncope, syncope, bleeding, or neurologic sequela. The patient is tolerating medications without difficulties and is otherwise without complaint today.   Patient underwent by V ICD implant:  Medtronic model [id number] Hillery Aldo XT CRT-D (serial Number [id number] ) device. Medtronic model 4598 - 88 (serial number [id number] ) LV lead  Medtronic model [id number] (serial # [id number] ) right atrial lead and a Medtronic model [id number] (serial number [id number]) right ventricular defibrillator lead  Follow-up chest x-ray revealed no pneumothorax.  Device interrogation showed normal device function.  The patient was seen by Dr. Lovena Le who felt he was stable for DC home.     Consults: None  Significant Diagnostic Studies:  CXR  Treatments: See above  Discharge Exam: Blood pressure 171/81, pulse 69, temperature 98.4 F (36.9 C), temperature source Axillary, resp. rate 23, height 6' (1.829 m), weight 217 lb 9.5 oz (98.7 kg), SpO2 94  %.   Disposition: 01-Home or Self Care      Discharge Instructions    Diet - low sodium heart healthy    Complete by:  As directed      Increase activity slowly    Complete by:  As directed             Medication List    TAKE these medications        acetaminophen 500 MG tablet  Commonly known as:  TYLENOL  Take 500 mg by mouth every 6 (six) hours as needed for moderate pain.     amiodarone 200 MG tablet  Commonly known as:  PACERONE  Take 1 tablet (200 mg total) by mouth daily.     apixaban 2.5 MG Tabs tablet  Commonly known as:  ELIQUIS  Take 1 tablet (2.5 mg total) by mouth 2 (two) times daily.     atorvastatin 20 MG tablet  Commonly known as:  LIPITOR  Take 20 mg by mouth daily.     carvedilol 6.25 MG tablet  Commonly known as:  COREG  Take 1 tablet (6.25 mg total) by mouth 2 (two) times daily with a meal.     furosemide 20 MG tablet  Commonly known as:  LASIX  Take 0.5 tablets (10 mg total) by mouth daily.     hydrALAZINE 25 MG tablet  Commonly known as:  APRESOLINE  Take 0.5 tablets (12.5 mg total) by mouth 2 (two) times daily before a meal.     isosorbide mononitrate 30 MG 24 hr tablet  Commonly known as:  IMDUR  Take 1 tablet (30 mg total) by mouth daily.     lisinopril 10 MG tablet  Commonly known as:  PRINIVIL,ZESTRIL  Take 2  tablets (20 mg total) by mouth daily.     Magnesium 250 MG Tabs  Take 1 tablet by mouth daily.     multivitamin capsule  Take 1 capsule by mouth daily.     omega-3 acid ethyl esters 1 g capsule  Commonly known as:  LOVAZA  Take 2 g by mouth 2 (two) times daily.     ranitidine 300 MG tablet  Commonly known as:  ZANTAC  Take 300 mg by mouth at bedtime.     traMADol 50 MG tablet  Commonly known as:  ULTRAM  Take 50 mg by mouth every 6 (six) hours as needed for moderate pain or severe pain (pain).       Follow-up Information    Follow up with Wound Check On 05/02/2015.   Why:  10:00AM   Contact information:    [address] [place] [address] [phone]     Greater than 30 minutes was spent completing the patient's discharge.    SignedTarri Fuller, [facility] 04/23/2015, 9:19 AM  EP Attending  Patient seen and examined. Agree with the findings above. He is stable for discharge with usual followup.  Mikle Bosworth.D.

## 2015-04-23 NOTE — Progress Notes (Signed)
Patient ID: [name], male   DOB: [birth year], 82 y.o.   MRN: [id number]    Patient Name: [name] Date of Encounter: 04/23/2015     Active Problems:   CHF (congestive heart failure) ([place])    SUBJECTIVE  Denies chest pain or sob.   CURRENT MEDS . amiodarone  200 mg Oral Daily  . apixaban  2.5 mg Oral BID  . atorvastatin  20 mg Oral Daily  . carvedilol  6.25 mg Oral BID WC  .  ceFAZolin (ANCEF) IV  2 g Intravenous Q8H  . Chlorhexidine Gluconate Cloth  6 each Topical Q0600  . famotidine  20 mg Oral Daily  . furosemide  10 mg Oral Daily  . hydrALAZINE  12.5 mg Oral BID AC  . isosorbide mononitrate  30 mg Oral Daily  . lisinopril  20 mg Oral Daily  . multivitamin with minerals  1 tablet Oral Daily  . mupirocin ointment   Nasal BID  . omega-3 acid ethyl esters  2 g Oral BID    OBJECTIVE  Filed Vitals:   04/22/15 2000 04/22/15 2001 04/23/15 0353 04/23/15 0511  BP: 146/66 146/66 159/68 171/81  Pulse: 64 64  69  Temp:  98.7 F (37.1 C)  98.4 F (36.9 C)  TempSrc:  Axillary  Axillary  Resp: _0 Height:      Weight:    217 lb 9.5 oz (98.7 kg)  SpO2: 96% 96% 97% 94%    Intake/Output Summary (Last 24 hours) at 04/23/15 0838 Last data filed at 04/22/15 2200  Gross per 24 hour  Intake    420 ml  Output    350 ml  Net     70 ml   Filed Weights   04/22/15 0758 04/23/15 0511  Weight: 205 lb (92.987 kg) 217 lb 9.5 oz (98.7 kg)    PHYSICAL EXAM  General: Pleasant, NAD. Neuro: Alert and oriented X 3. Moves all extremities spontaneously. Psych: Normal affect. HEENT:  Normal  Neck: Supple without bruits or JVD. Lungs:  Resp regular and unlabored, CTA. Incision without hematoma Heart: RRR no s3, s4, or murmurs. Abdomen: Soft, non-tender, non-distended, BS + x 4.  Extremities: No clubbing, cyanosis or edema. DP/PT/Radials 2+ and equal bilaterally.  Accessory Clinical Findings  CBC No results for input(s): WBC, NEUTROABS, HGB, HCT, MCV, PLT in  the last 72 hours. Basic Metabolic Panel No results for input(s): NA, K, CL, CO2, GLUCOSE, BUN, CREATININE, CALCIUM, MG, PHOS in the last 72 hours. Liver Function Tests No results for input(s): AST, ALT, ALKPHOS, BILITOT, PROT, ALBUMIN in the last 72 hours. No results for input(s): LIPASE, AMYLASE in the last 72 hours. Cardiac Enzymes No results for input(s): CKTOTAL, CKMB, CKMBINDEX, TROPONINI in the last 72 hours. BNP Invalid input(s): POCBNP D-Dimer No results for input(s): DDIMER in the last 72 hours. Hemoglobin A1C No results for input(s): HGBA1C in the last 72 hours. Fasting Lipid Panel No results for input(s): CHOL, HDL, LDLCALC, TRIG, CHOLHDL, LDLDIRECT in the last 72 hours. Thyroid Function Tests No results for input(s): TSH, T4TOTAL, T3FREE, THYROIDAB in the last 72 hours.  Invalid input(s): FREET3  TELE  P synchronous biv pacing  Radiology/Studies  No results found.  ASSESSMENT AND PLAN  1. Chronic systolic heart failure 2. LBBB 3. S/p BiV ICD implant Rec: he is doing well. His CXR and device interogation look good. Will plan to discharge today with usual followup.   Gregg Taylor,M.D.  04/23/2015 8:38  AM

## 2015-04-25 ENCOUNTER — Telehealth: Payer: Self-pay | Admitting: Cardiology

## 2015-04-25 NOTE — Telephone Encounter (Signed)
New message     Pt had a defib put in last Friday----he states that yesterday, his left hand was swollen.  He was instructed to call the office if he had any problems.  Please advise

## 2015-04-25 NOTE — Telephone Encounter (Signed)
Discussed with Dr. Caryl Comes, DOD.  Advised patient of Dr. Olin Pia recommendations that he should continue taking Eliquis and should try to elevate his hand/wrist above the level of his heart to help reduce swelling.  Advised if swelling worsens or redness, fever, chills, drainage, or pain develop, to call our office or seek medical attention.  Patient verbalizes understanding of instructions and denies additional questions or concerns at this time.

## 2015-04-25 NOTE — Telephone Encounter (Addendum)
Returned patient's call.  Patient's entire left hand swollen below wrist, patient denies pain or discoloration, states he is still able to make a fist but that it is "tight".  Patient states he had an IV in both hands, but that the swelling is not limited to the IV site.  Swelling has been present since yesterday afternoon.  Advised patient that I will discuss symptoms with MD and return his call.  Rescheduled wound check appointment to 05/05/15 per patient request (transportation issue).

## 2015-04-30 ENCOUNTER — Encounter (HOSPITAL_COMMUNITY): Payer: Self-pay | Admitting: Emergency Medicine

## 2015-04-30 ENCOUNTER — Inpatient Hospital Stay (HOSPITAL_COMMUNITY)
Admission: EM | Admit: 2015-04-30 | Discharge: 2015-05-02 | DRG: 291 | Disposition: A | Discharge status: Home / Self Care | Payer: Commercial Managed Care - HMO | Attending: Family Medicine | Admitting: Family Medicine

## 2015-04-30 ENCOUNTER — Emergency Department (HOSPITAL_COMMUNITY): Payer: Commercial Managed Care - HMO

## 2015-04-30 ENCOUNTER — Telehealth: Payer: Self-pay | Admitting: Physician Assistant

## 2015-04-30 DIAGNOSIS — I2583 Coronary atherosclerosis due to lipid rich plaque: Secondary | ICD-10-CM | POA: Diagnosis present

## 2015-04-30 DIAGNOSIS — Z820 Family history of epilepsy and other diseases of the nervous system: Secondary | ICD-10-CM

## 2015-04-30 DIAGNOSIS — Z9889 Other specified postprocedural states: Secondary | ICD-10-CM | POA: Diagnosis not present

## 2015-04-30 DIAGNOSIS — Z9049 Acquired absence of other specified parts of digestive tract: Secondary | ICD-10-CM

## 2015-04-30 DIAGNOSIS — N183 Chronic kidney disease, stage 3 unspecified: Secondary | ICD-10-CM

## 2015-04-30 DIAGNOSIS — Z9841 Cataract extraction status, right eye: Secondary | ICD-10-CM | POA: Diagnosis not present

## 2015-04-30 DIAGNOSIS — I2510 Atherosclerotic heart disease of native coronary artery without angina pectoris: Secondary | ICD-10-CM | POA: Diagnosis not present

## 2015-04-30 DIAGNOSIS — E1122 Type 2 diabetes mellitus with diabetic chronic kidney disease: Secondary | ICD-10-CM | POA: Diagnosis present

## 2015-04-30 DIAGNOSIS — I5021 Acute systolic (congestive) heart failure: Secondary | ICD-10-CM | POA: Diagnosis not present

## 2015-04-30 DIAGNOSIS — K219 Gastro-esophageal reflux disease without esophagitis: Secondary | ICD-10-CM | POA: Diagnosis present

## 2015-04-30 DIAGNOSIS — I429 Cardiomyopathy, unspecified: Secondary | ICD-10-CM

## 2015-04-30 DIAGNOSIS — J90 Pleural effusion, not elsewhere classified: Secondary | ICD-10-CM

## 2015-04-30 DIAGNOSIS — E785 Hyperlipidemia, unspecified: Secondary | ICD-10-CM

## 2015-04-30 DIAGNOSIS — R011 Cardiac murmur, unspecified: Secondary | ICD-10-CM | POA: Diagnosis present

## 2015-04-30 DIAGNOSIS — I10 Essential (primary) hypertension: Secondary | ICD-10-CM | POA: Diagnosis not present

## 2015-04-30 DIAGNOSIS — I5042 Chronic combined systolic (congestive) and diastolic (congestive) heart failure: Secondary | ICD-10-CM

## 2015-04-30 DIAGNOSIS — R0602 Shortness of breath: Secondary | ICD-10-CM | POA: Diagnosis not present

## 2015-04-30 DIAGNOSIS — Z961 Presence of intraocular lens: Secondary | ICD-10-CM | POA: Diagnosis present

## 2015-04-30 DIAGNOSIS — I110 Hypertensive heart disease with heart failure: Secondary | ICD-10-CM | POA: Diagnosis not present

## 2015-04-30 DIAGNOSIS — R0601 Orthopnea: Secondary | ICD-10-CM

## 2015-04-30 DIAGNOSIS — R0600 Dyspnea, unspecified: Secondary | ICD-10-CM

## 2015-04-30 DIAGNOSIS — I5043 Acute on chronic combined systolic (congestive) and diastolic (congestive) heart failure: Secondary | ICD-10-CM

## 2015-04-30 DIAGNOSIS — Z952 Presence of prosthetic heart valve: Secondary | ICD-10-CM | POA: Diagnosis not present

## 2015-04-30 DIAGNOSIS — Z79899 Other long term (current) drug therapy: Secondary | ICD-10-CM

## 2015-04-30 DIAGNOSIS — Z95810 Presence of automatic (implantable) cardiac defibrillator: Secondary | ICD-10-CM

## 2015-04-30 DIAGNOSIS — N189 Chronic kidney disease, unspecified: Secondary | ICD-10-CM | POA: Diagnosis present

## 2015-04-30 DIAGNOSIS — Z9842 Cataract extraction status, left eye: Secondary | ICD-10-CM | POA: Diagnosis not present

## 2015-04-30 DIAGNOSIS — I480 Paroxysmal atrial fibrillation: Secondary | ICD-10-CM | POA: Diagnosis present

## 2015-04-30 DIAGNOSIS — I5023 Acute on chronic systolic (congestive) heart failure: Secondary | ICD-10-CM | POA: Diagnosis present

## 2015-04-30 DIAGNOSIS — I509 Heart failure, unspecified: Secondary | ICD-10-CM | POA: Diagnosis not present

## 2015-04-30 DIAGNOSIS — Z951 Presence of aortocoronary bypass graft: Secondary | ICD-10-CM

## 2015-04-30 DIAGNOSIS — J45909 Unspecified asthma, uncomplicated: Secondary | ICD-10-CM | POA: Diagnosis present

## 2015-04-30 DIAGNOSIS — M1990 Unspecified osteoarthritis, unspecified site: Secondary | ICD-10-CM | POA: Diagnosis present

## 2015-04-30 DIAGNOSIS — I5041 Acute combined systolic (congestive) and diastolic (congestive) heart failure: Secondary | ICD-10-CM | POA: Diagnosis not present

## 2015-04-30 DIAGNOSIS — I130 Hypertensive heart and chronic kidney disease with heart failure and stage 1 through stage 4 chronic kidney disease, or unspecified chronic kidney disease: Secondary | ICD-10-CM | POA: Diagnosis not present

## 2015-04-30 DIAGNOSIS — Z950 Presence of cardiac pacemaker: Secondary | ICD-10-CM | POA: Diagnosis not present

## 2015-04-30 DIAGNOSIS — N179 Acute kidney failure, unspecified: Secondary | ICD-10-CM | POA: Diagnosis not present

## 2015-04-30 DIAGNOSIS — I350 Nonrheumatic aortic (valve) stenosis: Secondary | ICD-10-CM

## 2015-04-30 LAB — BASIC METABOLIC PANEL
Anion gap: 13 (ref 5–15)
BUN: 23 mg/dL — AB (ref 6–20)
CALCIUM: 9 mg/dL (ref 8.9–10.3)
CHLORIDE: 100 mmol/L — AB (ref 101–111)
CO2: 20 mmol/L — AB (ref 22–32)
CREATININE: 1.57 mg/dL — AB (ref 0.61–1.24)
GFR calc non Af Amer: 40 mL/min — ABNORMAL LOW (ref >60)
GFR, EST AFRICAN AMERICAN: 46 mL/min — AB (ref >60)
Glucose, Bld: 149 mg/dL — ABNORMAL HIGH (ref 65–99)
Potassium: 4 mmol/L (ref 3.5–5.1)
SODIUM: 133 mmol/L — AB (ref 135–145)

## 2015-04-30 LAB — CBC
HCT: 39.1 % (ref 39.0–52.0)
HEMOGLOBIN: 13.3 g/dL (ref 13.0–17.0)
MCH: 29.6 pg (ref 26.0–34.0)
MCHC: 34 g/dL (ref 30.0–36.0)
MCV: 86.9 fL (ref 78.0–100.0)
Platelets: 265 10*3/uL (ref 150–400)
RBC: 4.5 MIL/uL (ref 4.22–5.81)
RDW: 14.4 % (ref 11.5–15.5)
WBC: 10.3 10*3/uL (ref 4.0–10.5)

## 2015-04-30 LAB — URINALYSIS, ROUTINE W REFLEX MICROSCOPIC
BILIRUBIN URINE: NEGATIVE
Glucose, UA: NEGATIVE mg/dL
HGB URINE DIPSTICK: NEGATIVE
KETONES UR: NEGATIVE mg/dL
Leukocytes, UA: NEGATIVE
NITRITE: NEGATIVE
PH: 6 (ref 5.0–8.0)
Protein, ur: NEGATIVE mg/dL
Specific Gravity, Urine: 1.008 (ref 1.005–1.030)

## 2015-04-30 LAB — TROPONIN I: Troponin I: 0.05 ng/mL — ABNORMAL HIGH (ref <0.031)

## 2015-04-30 LAB — I-STAT TROPONIN, ED: TROPONIN I, POC: 0.03 ng/mL (ref 0.00–0.08)

## 2015-04-30 LAB — BRAIN NATRIURETIC PEPTIDE: B NATRIURETIC PEPTIDE 5: 3478.3 pg/mL — AB (ref 0.0–100.0)

## 2015-04-30 MED ORDER — ACETAMINOPHEN 650 MG RE SUPP: 650.0000 mg | Freq: Four times a day (QID) | RECTAL | Status: DC | PRN

## 2015-04-30 MED ORDER — MAGNESIUM OXIDE 400 (241.3 MG) MG PO TABS
400.0000 mg | ORAL_TABLET | Freq: Every evening | ORAL | Status: DC
  Administered 2015-04-30 – 2015-05-01 (×2): 400 mg via ORAL
  Filled 2015-04-30 (×2): qty 1

## 2015-04-30 MED ORDER — HYDRALAZINE HCL 25 MG PO TABS
12.5000 mg | ORAL_TABLET | Freq: Two times a day (BID) | ORAL | Status: DC
  Administered 2015-05-01 – 2015-05-02 (×3): 12.5 mg via ORAL
  Filled 2015-04-30 (×3): qty 1

## 2015-04-30 MED ORDER — ATORVASTATIN CALCIUM 20 MG PO TABS
20.0000 mg | ORAL_TABLET | Freq: Every day | ORAL | Status: DC
Start: 2015-05-01 — End: 2015-05-02
  Administered 2015-05-01: 20 mg via ORAL
  Filled 2015-04-30: qty 1

## 2015-04-30 MED ORDER — ADULT MULTIVITAMIN W/MINERALS CH
1.0000 | ORAL_TABLET | Freq: Every day | ORAL | Status: DC
  Administered 2015-05-01 – 2015-05-02 (×2): 1 via ORAL
  Filled 2015-04-30 (×2): qty 1

## 2015-04-30 MED ORDER — APIXABAN 2.5 MG PO TABS
2.5000 mg | ORAL_TABLET | Freq: Two times a day (BID) | ORAL | Status: DC
  Administered 2015-04-30 – 2015-05-02 (×4): 2.5 mg via ORAL
  Filled 2015-04-30 (×4): qty 1

## 2015-04-30 MED ORDER — FAMOTIDINE 20 MG PO TABS
10.0000 mg | ORAL_TABLET | Freq: Every day | ORAL | Status: DC
  Administered 2015-04-30 – 2015-05-01 (×2): 10 mg via ORAL
  Filled 2015-04-30 (×2): qty 1

## 2015-04-30 MED ORDER — ACETAMINOPHEN 325 MG PO TABS: 650.0000 mg | ORAL_TABLET | Freq: Four times a day (QID) | ORAL | Status: DC | PRN

## 2015-04-30 MED ORDER — CARVEDILOL 6.25 MG PO TABS: 6.2500 mg | ORAL_TABLET | Freq: Two times a day (BID) | ORAL | Status: DC

## 2015-04-30 MED ORDER — LISINOPRIL 20 MG PO TABS
20.0000 mg | ORAL_TABLET | Freq: Every evening | ORAL | Status: DC
  Administered 2015-04-30 – 2015-05-01 (×2): 20 mg via ORAL
  Filled 2015-04-30 (×2): qty 1

## 2015-04-30 MED ORDER — AMIODARONE HCL 200 MG PO TABS
200.0000 mg | ORAL_TABLET | Freq: Every day | ORAL | Status: DC
  Administered 2015-05-01 – 2015-05-02 (×2): 200 mg via ORAL
  Filled 2015-04-30 (×2): qty 1

## 2015-04-30 MED ORDER — FUROSEMIDE 10 MG/ML IJ SOLN: 40.0000 mg | Freq: Two times a day (BID) | INTRAMUSCULAR | Status: DC

## 2015-04-30 MED ORDER — ISOSORBIDE MONONITRATE ER 30 MG PO TB24
30.0000 mg | ORAL_TABLET | Freq: Every day | ORAL | Status: DC
Start: 2015-05-01 — End: 2015-05-02
  Administered 2015-05-01 – 2015-05-02 (×2): 30 mg via ORAL
  Filled 2015-04-30 (×2): qty 1

## 2015-04-30 MED ORDER — FUROSEMIDE 10 MG/ML IJ SOLN
40.0000 mg | Freq: Once | INTRAMUSCULAR | Status: AC
  Administered 2015-04-30: 40 mg via INTRAVENOUS
  Filled 2015-04-30: qty 4

## 2015-04-30 MED ORDER — OMEGA-3-ACID ETHYL ESTERS 1 G PO CAPS
2.0000 g | ORAL_CAPSULE | Freq: Two times a day (BID) | ORAL | Status: DC
  Administered 2015-04-30 – 2015-05-02 (×4): 2 g via ORAL
  Filled 2015-04-30 (×4): qty 2

## 2015-04-30 MED ORDER — SODIUM CHLORIDE 0.9 % IJ SOLN
3.0000 mL | Freq: Two times a day (BID) | INTRAMUSCULAR | Status: DC
  Administered 2015-04-30 – 2015-05-02 (×4): 3 mL via INTRAVENOUS

## 2015-04-30 NOTE — H&P (Signed)
[facility] History and Physical  [name] MHW:[id number] DOB: [birth year] DOA: 04/30/2015  Referring physician:  PCP: Martinique, BETTY G, MD  Specialists:   Chief Complaint: SOB   HPI: [name] is a 82 y.o. male with PMH of HTN, CAD h/op CABG, s/p AVR, CKD, CHF, Cardiomyopathy s/p AICD (04/22/15) presented with progressive SOB. Patient reported increasing shortness of breath associated with PND and orthopnea for several days. He also reports DOE, and pedal edema. He has dry cough, denies acute chest pains, no fever, no nausea, vomiting or diarrhea. He is not sure about his home lasix regimen.  -ED: Pt found to have CHF exacerbation, given lasix 40 Mg. ED d/w cardiology who recommended admission   Review of Systems: The patient denies anorexia, fever, weight loss,, vision loss, decreased hearing, hoarseness, chest pain, syncope, dyspnea on exertion, peripheral edema, balance deficits, hemoptysis, abdominal pain, melena, hematochezia, severe indigestion/heartburn, hematuria, incontinence, genital sores, muscle weakness, suspicious skin lesions, transient blindness, difficulty walking, depression, unusual weight change, abnormal bleeding, enlarged lymph nodes, angioedema, and breast masses.    Past Medical History  Diagnosis Date  . Heart murmur   . Hypercholesteremia   . HTN (hypertension)   . Diastolic CHF ([place])     2D echo 12/2010 - nl LVF, mild LAE, MAC with moderately thickened MV leaflets and trivial MR, normal functioning prosthetic AVR with mild increased gradient, grade II diastolic dysfunction   . Hypotension   . Orthostatic hypotension   . Aortic valve disorder 2009    severe AS s/p AVR with pericardial tissue valve  . Aortic stenosis   . Barrett esophagus   . CAD (coronary artery disease)     s/p 2 vessel CABG with LIMA to LAD, SVG to diagonal 1, SVG to RCA 12/09  . Cardiomyopathy ([place])   . H/O: rheumatic fever   . Atrial fibrillation ([place])     post op afib  with no reoccurence  . Cardiomyopathy ([place])     a. Echo 5/16:  mild LVH, EF 30-35%, diff HK, AVR ok, mild MR, severe LAE, mod RAE, reduced RVSF.  Repeat limited echo 10/2014 with EF 25-30%  . History of PFTs     a. Amiodarone started 5/16 >> PFTs w/ DLCO 6/16:  FEV1 72% predicted, FEV1/FVC 66%, DLCO 66% >> minimal reversible obstructive airways disease with mild diffusion defect (suggestive of emphysema but absence of hyperinflation inconsistent with dx)  . AICD (automatic cardioverter/defibrillator) present   . Asthma     "a touch"  . Diabetes ([place])     "I'm prediabetic" (04/22/2015)  . GERD (gastroesophageal reflux disease)   . History of hiatal hernia   . Hepatitis 1957    "don't know what kind:  . Arthritis     "minor everywhere" (04/22/2015)  . CKD (chronic kidney disease)   . Pneumonia 08/2014    "dr thought I may have had a touch"   Past Surgical History  Procedure Laterality Date  . Coronary artery bypass graft  03/10/2008    x 3 Dr. Roxan Hockey  . Aortic valve replacement  with 23-mm Magna Ease pericardial valve, model number    with 23-mm Magna Ease pericardial valve, model [id number], serial number [id number]   . Cataract extraction w/ intraocular lens  implant, bilateral Bilateral   . Cardioversion N/A 01/27/2013    Procedure: CARDIOVERSION;  Surgeon: Sueanne Margarita, MD;  Location: [facility];  Service: Cardiovascular;  Laterality: N/A;  . Cardioversion N/A 08/25/2014    Procedure:  CARDIOVERSION;  Surgeon: Sanda Klein, MD;  Location: [facility];  Service: Cardiovascular;  Laterality: N/A;  . Cardiac catheterization N/A 11/04/2014    Procedure: Left Heart Cath and Cors/Grafts Angiography;  Surgeon: Leonie Man, MD;  Location: [facility];  Service: Cardiovascular;  Laterality: N/A;  . Bi-ventricular implantable cardioverter defibrillator  (crt-d)  04/22/2015  . Tonsillectomy    . Appendectomy  1940s  . Cardiac valve replacement    . Esophagogastroduodenoscopy  (egd) with esophageal dilation  X1  . Ep implantable device N/A 04/22/2015    Procedure: BiV ICD Insertion CRT-D;  Surgeon: [name], MD;  Location: [facility];  Service: Cardiovascular;  Laterality: N/A;   Social History:  reports that he has never smoked. He has never used smokeless tobacco. He reports that he drinks alcohol. He reports that he does not use illicit drugs. Home;  where does patient live--home, ALF, SNF? and with whom if at home? Yes;  Can patient participate in ADLs?  Allergies  Allergen Reactions  . Novocain [Procaine] Swelling    Family History  Problem Relation Age of Onset  . Alzheimer's disease    . Alzheimer's disease Mother     (be sure to complete)  Prior to Admission medications   Medication Sig Start Date End Date Taking? Authorizing Provider  acetaminophen (TYLENOL) 500 MG tablet Take 500 mg by mouth every 6 (six) hours as needed for moderate pain.   Yes Historical Provider, MD  amiodarone (PACERONE) 200 MG tablet Take 1 tablet (200 mg total) by mouth daily. 09/03/14  Yes Liliane Shi, PA-C  apixaban (ELIQUIS) 2.5 MG TABS tablet Take 1 tablet (2.5 mg total) by mouth 2 (two) times daily. 01/19/15  Yes Sueanne Margarita, MD  atorvastatin (LIPITOR) 20 MG tablet Take 20 mg by mouth daily at 6 PM.    Yes Historical Provider, MD  carvedilol (COREG) 6.25 MG tablet Take 1 tablet (6.25 mg total) by mouth 2 (two) times daily with a meal. 02/16/15  Yes Sueanne Margarita, MD  furosemide (LASIX) 20 MG tablet Take 0.5 tablets (10 mg total) by mouth daily. 02/16/15  Yes Sueanne Margarita, MD  hydrALAZINE (APRESOLINE) 25 MG tablet Take 0.5 tablets (12.5 mg total) by mouth 2 (two) times daily before a meal. 02/16/15  Yes Sueanne Margarita, MD  isosorbide mononitrate (IMDUR) 30 MG 24 hr tablet Take 1 tablet (30 mg total) by mouth daily. 01/19/15  Yes Sueanne Margarita, MD  lisinopril (PRINIVIL,ZESTRIL) 10 MG tablet Take 20 mg by mouth every evening.  02/16/15  Yes Sueanne Margarita, MD  Magnesium 250 MG TABS Take 1 tablet by mouth every evening.    Yes Historical Provider, MD  Multiple Vitamin (MULTIVITAMIN) capsule Take 1 capsule by mouth daily.   Yes Historical Provider, MD  omega-3 acid ethyl esters (LOVAZA) 1 G capsule Take 2 g by mouth 2 (two) times daily.   Yes Historical Provider, MD  ranitidine (ZANTAC) 300 MG tablet Take 300 mg by mouth at bedtime.   Yes Historical Provider, MD   Physical Exam: Filed Vitals:   04/30/15 1700 04/30/15 1730  BP: 161/95 152/93  Pulse: 66 63  Temp:    Resp: 15 19     General:  Alert. No distress   Eyes: eom-i, perrla   ENT: no oral ulcers   Neck: supple   Cardiovascular: s1,s2 distant HS. RRR  Respiratory: diminished LL  Abdomen: soft, nt,nd   Skin: some  ecchymosis   Musculoskeletal: pedal edema   Psychiatric: no hallucinations   Neurologic: CN 2-12 intact. Motor 5/5 BL   Labs on Admission:  Basic Metabolic Panel:  Recent Labs Lab 04/30/15 1312  NA 133*  K 4.0  CL 100*  CO2 20*  GLUCOSE 149*  BUN 23*  CREATININE 1.57*  CALCIUM 9.0   Liver Function Tests: No results for input(s): AST, ALT, ALKPHOS, BILITOT, PROT, ALBUMIN in the last 168 hours. No results for input(s): LIPASE, AMYLASE in the last 168 hours. No results for input(s): AMMONIA in the last 168 hours. CBC:  Recent Labs Lab 04/30/15 1312  WBC 10.3  HGB 13.3  HCT 39.1  MCV 86.9  PLT 265   Cardiac Enzymes:  Recent Labs Lab 04/30/15 1545  TROPONINI 0.05*    BNP (last 3 results)  Recent Labs  08/22/14 2044 04/30/15 1545  BNP 592.3* 3478.3*    ProBNP (last 3 results) No results for input(s): PROBNP in the last 8760 hours.  CBG: No results for input(s): GLUCAP in the last 168 hours.  Radiological Exams on Admission: Dg Chest 2 View  04/30/2015  CLINICAL DATA:  82 year old male with shortness of breath. Recent appear burr later placement. Initial encounter. EXAM: CHEST  2 VIEW COMPARISON:  04/23/2015 and  earlier. FINDINGS: Increased small bilateral pleural effusions. No acute pulmonary edema. Stable cardiomegaly and mediastinal contours. Sequelae of CABG. Left chest 3 lead cardiac pacemaker/ AICD appears stable. No pneumothorax. Visualized tracheal air column is within normal limits. No acute osseous abnormality identified. Calcified aortic atherosclerosis. IMPRESSION: Increased small bilateral pleural effusions. No acute pulmonary edema. Electronically Signed   By: Genevie Ann M.D.   On: 04/30/2015 14:37    EKG: Independently reviewed.   Assessment/Plan Active Problems:   Acute CHF (congestive heart failure) ([place])   82 y.o. male with PMH of HTN, CAD h/o CABG, s/p AVR, CKD, CHF, Cardiomyopathy s/p AICD (04/22/15) presented with progressive SOB.  -Admitted with acute on chronic CHF  1. Acute on chronic CHF. Systolic HF. Echo. LVEF 25-30% (01/2015). H/o Cardiomyopathy s/p AICD (04/22/15) -Pt received IV lasix 40 mg in ED. We will cont IV lasix.cont BB, hydralzine, NTG, also he is on ACE.  Monitor I/O, urine output. Labs. Check echo.   2. CAD h/o CABG. No acute chest pains. Trop 0.05->?likely due to cardiomyopathy -cont BB, statins. Pt is not on ASA. Obtain serial trop. Defer management to cardiology.  3. HTN. Not at goal. Cont home regimen. Titrate as needed 4. PAF. On amiodarone, apixaban   Cardiology.  if consultant consulted, please document name and whether formally or informally consulted  Code Status: full (must indicate code status--if unknown or must be presumed, indicate so) Family Communication: d/w patient, ER physician  (indicate person spoken with, if applicable, with phone number if by telephone) Disposition Plan: home pend clinical improvement  (indicate anticipated LOS)  Time spent: >45 minutes   Kinnie Feil [facility] Pager [phone]  If 7PM-7AM, please contact night-coverage [email or web address] Password [email or web address] 04/30/2015, 6:00 PM

## 2015-04-30 NOTE — ED Notes (Signed)
Attempted to call report

## 2015-04-30 NOTE — ED Notes (Signed)
Pt sts increased SOB worse when laying flat; pt sts had difibulator placed last Friday

## 2015-04-30 NOTE — Consult Note (Signed)
Admit date: 04/30/2015 Referring Physician  Dr. Daleen Bo  Primary Physician  Dr. [name] Primary Cardiologist  Dr. [name] Reason for Consultation  SOB  HPI: This is a 82yo WM with a history of  He has a history of hypertension, aortic stenosis status post AVR, chronic combined systolic and diastolic CHF in the setting of A. fib with RVR, orthostatic hypotension, dilated cardiomyopathy EF 25-30%, CAD status post CABG, and paroxysmal atrial fibrillation. He has been on optimal medical therapy with BB/hydralazine and Imdur as well as Lasix for the last 3 months.  He does have paroxysmal atrial fibrillation and has been cardioverted in the past and is now on amiodarone.  He was recently referred to EP for consideration of AICD.  He has had stable class II CHF symptoms in the past.  A week ago he underwent CRT-D placement without complications.  He did well for the first few days but then started having symptoms of SOB that has become progressive.  He has not really had any change in his baseline weight but has noticed some increase in abdominal girth.  He has had some mild LE edema.  He denies any fever or chills but has had a nonproductive cough.  He presented to the ER with worsening SOB and Cardiology is now asked to consult.       PMH:   Past Medical History  Diagnosis Date  . Heart murmur   . Hypercholesteremia   . HTN (hypertension)   . Diastolic CHF ([place])     2D echo 12/2010 - nl LVF, mild LAE, MAC with moderately thickened MV leaflets and trivial MR, normal functioning prosthetic AVR with mild increased gradient, grade II diastolic dysfunction   . Hypotension   . Orthostatic hypotension   . Aortic valve disorder 2009    severe AS s/p AVR with pericardial tissue valve  . Aortic stenosis   . Barrett esophagus   . CAD (coronary artery disease)     s/p 2 vessel CABG with LIMA to LAD, SVG to diagonal 1, SVG to RCA 12/09  . Cardiomyopathy ([place])   . H/O: rheumatic fever   .  Atrial fibrillation ([place])     post op afib with no reoccurence  . Cardiomyopathy ([place])     a. Echo 5/16:  mild LVH, EF 30-35%, diff HK, AVR ok, mild MR, severe LAE, mod RAE, reduced RVSF.  Repeat limited echo 10/2014 with EF 25-30%  . History of PFTs     a. Amiodarone started 5/16 >> PFTs w/ DLCO 6/16:  FEV1 72% predicted, FEV1/FVC 66%, DLCO 66% >> minimal reversible obstructive airways disease with mild diffusion defect (suggestive of emphysema but absence of hyperinflation inconsistent with dx)  . AICD (automatic cardioverter/defibrillator) present   . Asthma     "a touch"  . Diabetes ([place])     "I'm prediabetic" (04/22/2015)  . GERD (gastroesophageal reflux disease)   . History of hiatal hernia   . Hepatitis 1957    "don't know what kind:  . Arthritis     "minor everywhere" (04/22/2015)  . CKD (chronic kidney disease)   . Pneumonia 08/2014    "dr thought I may have had a touch"     PSH:   Past Surgical History  Procedure Laterality Date  . Coronary artery bypass graft  03/10/2008    x 3 Dr. Roxan Hockey  . Aortic valve replacement  with 23-mm Magna Ease pericardial valve, model number    with 23-mm Magna  Ease pericardial valve, model [id number], serial number [id number]   . Cataract extraction w/ intraocular lens  implant, bilateral Bilateral   . Cardioversion N/A 01/27/2013    Procedure: CARDIOVERSION;  Surgeon: Sueanne Margarita, MD;  Location: [facility];  Service: Cardiovascular;  Laterality: N/A;  . Cardioversion N/A 08/25/2014    Procedure: CARDIOVERSION;  Surgeon: Sanda Klein, MD;  Location: [facility];  Service: Cardiovascular;  Laterality: N/A;  . Cardiac catheterization N/A 11/04/2014    Procedure: Left Heart Cath and Cors/Grafts Angiography;  Surgeon: Leonie Man, MD;  Location: [facility];  Service: Cardiovascular;  Laterality: N/A;  . Bi-ventricular implantable cardioverter defibrillator  (crt-d)  04/22/2015  . Tonsillectomy    . Appendectomy  1940s  .  Cardiac valve replacement    . Esophagogastroduodenoscopy (egd) with esophageal dilation  X1  . Ep implantable device N/A 04/22/2015    Procedure: BiV ICD Insertion CRT-D;  Surgeon: [name], MD;  Location: [facility];  Service: Cardiovascular;  Laterality: N/A;    Allergies:  Novocain Prior to Admit Meds:   (Not in a hospital admission) Fam HX:    Family History  Problem Relation Age of Onset  . Alzheimer's disease    . Alzheimer's disease Mother    Social HX:    Social History   Social History  . Marital Status: Married    Spouse Name: N/A  . Number of Children: N/A  . Years of Education: N/A   Occupational History  . Not on file.   Social History Main Topics  . Smoking status: Never Smoker   . Smokeless tobacco: Never Used  . Alcohol Use: Yes     Comment: 04/22/2015 "nothing since the 1980s; never had a problem w/it"  . Drug Use: No  . Sexual Activity: No   Other Topics Concern  . Not on file   Social History Narrative     ROS:  All 11 ROS were addressed and are negative except what is stated in the HPI  Physical Exam: Blood pressure 162/107, pulse 70, temperature 97.8 F (36.6 C), temperature source Oral, resp. rate 20, height 6' (1.829 m), weight 210 lb (95.255 kg), SpO2 99 %.    General: Well developed, well nourished, in no acute distress Head: Eyes PERRLA, No xanthomas.   Normal cephalic and atramatic  Lungs:   Clear bilaterally to auscultation and percussion. Heart:   HRRR S1 S2 Pulses are 2+ & equal. Neck veins are distended. Abdomen: Bowel sounds are positive, abdomen firm but non-tender without masses  Extremities:   No clubbing, cyanosis .  DP +1.  Trace edema Neuro: Alert and oriented X 3. Psych:  Good affect, responds appropriately    Labs:   Lab Results  Component Value Date   WBC 10.3 04/30/2015   HGB 13.3 04/30/2015   HCT 39.1 04/30/2015   MCV 86.9 04/30/2015   PLT 265 04/30/2015    Recent Labs Lab 04/30/15 1312    NA 133*  K 4.0  CL 100*  CO2 20*  BUN 23*  CREATININE 1.57*  CALCIUM 9.0  GLUCOSE 149*   No results found for: PTT Lab Results  Component Value Date   INR 1.4* 11/01/2014   INR 1.3* 01/23/2013   INR 1.4 04/08/2008   Lab Results  Component Value Date   TROPONINI 0.05* 04/30/2015     Lab Results  Component Value Date   CHOL 123* 01/19/2015   CHOL 93* 06/16/2014   CHOL 107  12/11/2013   Lab Results  Component Value Date   HDL 45 01/19/2015   HDL 34* 06/16/2014   HDL 36* 12/11/2013   Lab Results  Component Value Date   LDLCALC 46 01/19/2015   LDLCALC 34 06/16/2014   LDLCALC 31 12/11/2013   Lab Results  Component Value Date   TRIG 161* 01/19/2015   TRIG 124 06/16/2014   TRIG 200* 12/11/2013   Lab Results  Component Value Date   CHOLHDL 2.7 01/19/2015   No results found for: LDLDIRECT    Radiology:  Dg Chest 2 View  04/30/2015  CLINICAL DATA:  82 year old male with shortness of breath. Recent appear burr later placement. Initial encounter. EXAM: CHEST  2 VIEW COMPARISON:  04/23/2015 and earlier. FINDINGS: Increased small bilateral pleural effusions. No acute pulmonary edema. Stable cardiomegaly and mediastinal contours. Sequelae of CABG. Left chest 3 lead cardiac pacemaker/ AICD appears stable. No pneumothorax. Visualized tracheal air column is within normal limits. No acute osseous abnormality identified. Calcified aortic atherosclerosis. IMPRESSION: Increased small bilateral pleural effusions. No acute pulmonary edema. Electronically Signed   By: Genevie Ann M.D.   On: 04/30/2015 14:37    EKG:  NSR with V paced rhythm  ASSESSMENT/PLAN: 1.  Acute on chronic combined systolic/diastolic CHF - ? Etiology.  He is compliant with his meds and diet.  His weight has not changed much.  This appears to have started after placement of CRT-D.  ? Whether he needs adjustments made in timing of BiV pacing.  Will have EP see in the am.  Start Lasix IV and follow renal function.   Continue BB/hydralazine/nitrates 2.  Acute on chronic kidney disease - I suspect creatinine is elevated due to CHF and hopefully will improve with diuresis. 3.  ASCAD s/p CABG with no angina 4.  AS s/p pericardial tissue AVR 5.  PAF maintaining NSR on Amio/BB. Continue Eliquis (adjusted for renal insuff)  Sueanne Margarita, MD  04/30/2015  8:39 PM

## 2015-04-30 NOTE — Telephone Encounter (Signed)
Patient called weekend answering service - recently discharged s/p defib placement.  Over the last several days he's had worsening cough, SOB, orthopnea, and no energy. He took extra Lasix without improvement in sx but due to CKD states he has been told he should not take too much without being evaluated. Last Cr was 1.72 (trending up slightly from prior value in November). Given persistence of symptoms despite outpatient med titration and CKD, I advised he proceed to ER for further evaluation. He verbalized understanding and gratitude and plans to have someone drive him. Dayna Dunn PA-C

## 2015-04-30 NOTE — ED Provider Notes (Addendum)
CSN: [id number]     Arrival date & time 04/30/15  1257 History   First MD Initiated Contact with Patient 04/30/15 1457     Chief Complaint  Patient presents with  . Shortness of Breath     (Consider location/radiation/quality/duration/timing/severity/associated sxs/prior Treatment) Patient is a 82 y.o. male presenting with shortness of breath. The history is provided by the patient.  Shortness of Breath Associated symptoms: cough   Associated symptoms: no abdominal pain, no chest pain, no fever, no headaches, no neck pain, no rash, no sore throat and no vomiting   Patient c/o sob onset the last 3 nights.  Pt with hx afib, cad/cabg, AS/AVR, CM w ef 25-30%, who presents with increased sob, orthopnea the past 3 nights. Moderate. Worse w lying flat at night. Denies current or recent chest pain or discomfort. Denies increased swelling, and states weight has been relatively stable. No diet/fluid indiscretion. Compliant w normal meds, no recent change in meds or doses. Non prod cough. No sore throat, runny nose or other uri c/o. No fever or chills. States approximately 1 week ago had pacer/defib placed. No shocks.      Past Medical History  Diagnosis Date  . Heart murmur   . Hypercholesteremia   . HTN (hypertension)   . Diastolic CHF ([place])     2D echo 12/2010 - nl LVF, mild LAE, MAC with moderately thickened MV leaflets and trivial MR, normal functioning prosthetic AVR with mild increased gradient, grade II diastolic dysfunction   . Hypotension   . Orthostatic hypotension   . Aortic valve disorder 2009    severe AS s/p AVR with pericardial tissue valve  . Aortic stenosis   . Barrett esophagus   . CAD (coronary artery disease)     s/p 2 vessel CABG with LIMA to LAD, SVG to diagonal 1, SVG to RCA 12/09  . Cardiomyopathy ([place])   . H/O: rheumatic fever   . Atrial fibrillation ([place])     post op afib with no reoccurence  . Cardiomyopathy ([place])     a. Echo 5/16:  mild LVH, EF 30-35%, diff HK,  AVR ok, mild MR, severe LAE, mod RAE, reduced RVSF.  Repeat limited echo 10/2014 with EF 25-30%  . History of PFTs     a. Amiodarone started 5/16 >> PFTs w/ DLCO 6/16:  FEV1 72% predicted, FEV1/FVC 66%, DLCO 66% >> minimal reversible obstructive airways disease with mild diffusion defect (suggestive of emphysema but absence of hyperinflation inconsistent with dx)  . AICD (automatic cardioverter/defibrillator) present   . Asthma     "a touch"  . Diabetes ([place])     "I'm prediabetic" (04/22/2015)  . GERD (gastroesophageal reflux disease)   . History of hiatal hernia   . Hepatitis 1957    "don't know what kind:  . Arthritis     "minor everywhere" (04/22/2015)  . CKD (chronic kidney disease)   . Pneumonia 08/2014    "dr thought I may have had a touch"   Past Surgical History  Procedure Laterality Date  . Coronary artery bypass graft  03/10/2008    x 3 Dr. Roxan Hockey  . Aortic valve replacement  with 23-mm Magna Ease pericardial valve, model number    with 23-mm Magna Ease pericardial valve, model [id number], serial number [id number]   . Cataract extraction w/ intraocular lens  implant, bilateral Bilateral   . Cardioversion N/A 01/27/2013    Procedure: CARDIOVERSION;  Surgeon: Sueanne Margarita, MD;  Location: [facility];  Service: Cardiovascular;  Laterality: N/A;  . Cardioversion N/A 08/25/2014    Procedure: CARDIOVERSION;  Surgeon: Sanda Klein, MD;  Location: [facility];  Service: Cardiovascular;  Laterality: N/A;  . Cardiac catheterization N/A 11/04/2014    Procedure: Left Heart Cath and Cors/Grafts Angiography;  Surgeon: Leonie Man, MD;  Location: [facility];  Service: Cardiovascular;  Laterality: N/A;  . Bi-ventricular implantable cardioverter defibrillator  (crt-d)  04/22/2015  . Tonsillectomy    . Appendectomy  1940s  . Cardiac valve replacement    . Esophagogastroduodenoscopy (egd) with esophageal dilation  X1  . Ep implantable device N/A 04/22/2015    Procedure: BiV  ICD Insertion CRT-D;  Surgeon: [name], MD;  Location: [facility];  Service: Cardiovascular;  Laterality: N/A;   Family History  Problem Relation Age of Onset  . Alzheimer's disease    . Alzheimer's disease Mother    Social History  Substance Use Topics  . Smoking status: Never Smoker   . Smokeless tobacco: Never Used  . Alcohol Use: Yes     Comment: 04/22/2015 "nothing since the 1980s; never had a problem w/it"    Review of Systems  Constitutional: Negative for fever and chills.  HENT: Negative for sore throat.   Eyes: Negative for redness.  Respiratory: Positive for cough and shortness of breath.   Cardiovascular: Negative for chest pain, palpitations and leg swelling.  Gastrointestinal: Positive for nausea. Negative for vomiting, abdominal pain and diarrhea.  Genitourinary: Negative for dysuria and flank pain.  Musculoskeletal: Negative for back pain and neck pain.  Skin: Negative for rash.  Neurological: Negative for headaches.  Hematological: Does not bruise/bleed easily.  Psychiatric/Behavioral: Negative for confusion.      Allergies  Novocain  Home Medications   Prior to Admission medications   Medication Sig Start Date End Date Taking? Authorizing Provider  acetaminophen (TYLENOL) 500 MG tablet Take 500 mg by mouth every 6 (six) hours as needed for moderate pain.    Historical Provider, MD  amiodarone (PACERONE) 200 MG tablet Take 1 tablet (200 mg total) by mouth daily. 09/03/14   Liliane Shi, PA-C  apixaban (ELIQUIS) 2.5 MG TABS tablet Take 1 tablet (2.5 mg total) by mouth 2 (two) times daily. 01/19/15   Sueanne Margarita, MD  atorvastatin (LIPITOR) 20 MG tablet Take 20 mg by mouth daily.    Historical Provider, MD  carvedilol (COREG) 6.25 MG tablet Take 1 tablet (6.25 mg total) by mouth 2 (two) times daily with a meal. 02/16/15   Sueanne Margarita, MD  furosemide (LASIX) 20 MG tablet Take 0.5 tablets (10 mg total) by mouth daily. 02/16/15   Sueanne Margarita, MD  hydrALAZINE (APRESOLINE) 25 MG tablet Take 0.5 tablets (12.5 mg total) by mouth 2 (two) times daily before a meal. 02/16/15   Sueanne Margarita, MD  isosorbide mononitrate (IMDUR) 30 MG 24 hr tablet Take 1 tablet (30 mg total) by mouth daily. 01/19/15   Sueanne Margarita, MD  lisinopril (PRINIVIL,ZESTRIL) 10 MG tablet Take 2 tablets (20 mg total) by mouth daily. 02/16/15   Sueanne Margarita, MD  Magnesium 250 MG TABS Take 1 tablet by mouth daily.     Historical Provider, MD  Multiple Vitamin (MULTIVITAMIN) capsule Take 1 capsule by mouth daily.    Historical Provider, MD  omega-3 acid ethyl esters (LOVAZA) 1 G capsule Take 2 g by mouth 2 (two) times daily.    Historical Provider, MD  ranitidine (ZANTAC) 300 MG tablet  Take 300 mg by mouth at bedtime.    Historical Provider, MD  traMADol (ULTRAM) 50 MG tablet Take 50 mg by mouth every 6 (six) hours as needed for moderate pain or severe pain (pain).    Historical Provider, MD   BP 164/92 mmHg  Pulse 69  Temp(Src) 97.8 F (36.6 C) (Oral)  Resp 18  Ht 6' (1.829 m)  Wt 95.255 kg  BMI 28.47 kg/m2  SpO2 96% Physical Exam  Constitutional: He is oriented to person, place, and time. He appears well-developed and well-nourished. No distress.  HENT:  Mouth/Throat: Oropharynx is clear and moist.  Eyes: Conjunctivae are normal. No scleral icterus.  Neck: Neck supple. No JVD present. No tracheal deviation present.  Cardiovascular: Normal rate, regular rhythm, normal heart sounds and intact distal pulses.  Exam reveals no gallop and no friction rub.   No murmur heard. Pulmonary/Chest: Effort normal and breath sounds normal. No accessory muscle usage. No respiratory distress.  Defib/pacer site right upper chest without sign of infection  Abdominal: Soft. Bowel sounds are normal. He exhibits no distension. There is no tenderness.  Musculoskeletal: Normal range of motion.  Mild bilateral ankle edema, symmetric.   Neurological: He is alert and oriented  to person, place, and time.  Skin: Skin is warm and dry. No rash noted. He is not diaphoretic.  Psychiatric: He has a normal mood and affect.  Nursing note and vitals reviewed.   ED Course  Procedures (including critical care time) Labs Review   Results for orders placed or performed during the hospital encounter of 48/25/00  Basic metabolic panel  Result Value Ref Range   Sodium 133 (L) 135 - 145 mmol/L   Potassium 4.0 3.5 - 5.1 mmol/L   Chloride 100 (L) 101 - 111 mmol/L   CO2 20 (L) 22 - 32 mmol/L   Glucose, Bld 149 (H) 65 - 99 mg/dL   BUN 23 (H) 6 - 20 mg/dL   Creatinine, Ser 1.57 (H) 0.61 - 1.24 mg/dL   Calcium 9.0 8.9 - 10.3 mg/dL   GFR calc non Af Amer 40 (L) >60 mL/min   GFR calc Af Amer 46 (L) >60 mL/min   Anion gap 13 5 - 15  CBC  Result Value Ref Range   WBC 10.3 4.0 - 10.5 K/uL   RBC 4.50 4.22 - 5.81 MIL/uL   Hemoglobin 13.3 13.0 - 17.0 g/dL   HCT 39.1 39.0 - 52.0 %   MCV 86.9 78.0 - 100.0 fL   MCH 29.6 26.0 - 34.0 pg   MCHC 34.0 30.0 - 36.0 g/dL   RDW 14.4 11.5 - 15.5 %   Platelets 265 150 - 400 K/uL  Brain natriuretic peptide  Result Value Ref Range   B Natriuretic Peptide 3478.3 (H) 0.0 - 100.0 pg/mL  Troponin I  Result Value Ref Range   Troponin I 0.05 (H) <0.031 ng/mL  I-stat troponin, ED (not at [facility], [facility])  Result Value Ref Range   Troponin i, poc 0.03 0.00 - 0.08 ng/mL   Comment 3           Dg Chest 2 View  04/30/2015  CLINICAL DATA:  82 year old male with shortness of breath. Recent appear burr later placement. Initial encounter. EXAM: CHEST  2 VIEW COMPARISON:  04/23/2015 and earlier. FINDINGS: Increased small bilateral pleural effusions. No acute pulmonary edema. Stable cardiomegaly and mediastinal contours. Sequelae of CABG. Left chest 3 lead cardiac pacemaker/ AICD appears stable. No pneumothorax. Visualized tracheal air column is  within normal limits. No acute osseous abnormality identified. Calcified aortic atherosclerosis. IMPRESSION:  Increased small bilateral pleural effusions. No acute pulmonary edema. Electronically Signed   By: Genevie Ann M.D.   On: 04/30/2015 14:37   Dg Chest 2 View  04/23/2015  CLINICAL DATA:  Placement of cardiac device. EXAM: CHEST  2 VIEW COMPARISON:  08/22/2014 FINDINGS: Interval placement LEFT-sided pacemaker with 3 continuous leads. Sternotomy wires overlie normal cardiac silhouette. No effusion, infiltrate, pneumothorax. IMPRESSION: LEFT-sided pacer place without complication. Electronically Signed   By: Suzy Bouchard M.D.   On: 04/23/2015 11:55         I have personally reviewed and evaluated these images and lab results as part of my medical decision-making.   EKG Interpretation   Date/Time:  Saturday April 30 2015 13:12:41 EST Ventricular Rate:  69 PR Interval:  124 QRS Duration: 170 QT Interval:  536 QTC Calculation: 574 R Axis:   -74 Paced rhythm.       MDM   Iv ns. Continuous pulse ox and monitor.  Labs. Cxr. Ecg.  Reviewed nursing notes and prior charts for additional history.   BNP markedly elevated.   Lasix 40 mg iv.  Given symptoms, orthopnea, mild bil leg/ankle edema, bil pleural effusion, feel c/w chf - pt notes symptom onset post recent pacemaker/defib.  Will discuss with cardiology.  Discussed pt with Dr Ashok Norris, pts cardiologist - she requests admit to hospitalist service, they will consult.   Hospitalist paged for admission.       Lajean Saver, MD 04/30/15 1740

## 2015-04-30 NOTE — ED Notes (Signed)
2nd attempt to call report.

## 2015-04-30 NOTE — ED Notes (Signed)
Admitting MD at bedside.

## 2015-05-01 ENCOUNTER — Inpatient Hospital Stay (HOSPITAL_COMMUNITY): Payer: Commercial Managed Care - HMO

## 2015-05-01 DIAGNOSIS — I509 Heart failure, unspecified: Secondary | ICD-10-CM

## 2015-05-01 DIAGNOSIS — I5023 Acute on chronic systolic (congestive) heart failure: Secondary | ICD-10-CM

## 2015-05-01 DIAGNOSIS — N189 Chronic kidney disease, unspecified: Secondary | ICD-10-CM

## 2015-05-01 LAB — TROPONIN I
Troponin I: 0.05 ng/mL — ABNORMAL HIGH (ref <0.031)
Troponin I: 0.05 ng/mL — ABNORMAL HIGH (ref <0.031)
Troponin I: 0.06 ng/mL — ABNORMAL HIGH (ref <0.031)

## 2015-05-01 LAB — BASIC METABOLIC PANEL
Anion gap: 10 (ref 5–15)
BUN: 22 mg/dL — AB (ref 6–20)
CHLORIDE: 101 mmol/L (ref 101–111)
CO2: 27 mmol/L (ref 22–32)
CREATININE: 1.58 mg/dL — AB (ref 0.61–1.24)
Calcium: 8.7 mg/dL — ABNORMAL LOW (ref 8.9–10.3)
GFR calc Af Amer: 46 mL/min — ABNORMAL LOW (ref >60)
GFR calc non Af Amer: 40 mL/min — ABNORMAL LOW (ref >60)
GLUCOSE: 101 mg/dL — AB (ref 65–99)
Potassium: 3.8 mmol/L (ref 3.5–5.1)
Sodium: 138 mmol/L (ref 135–145)

## 2015-05-01 MED ORDER — FUROSEMIDE 40 MG PO TABS
40.0000 mg | ORAL_TABLET | Freq: Two times a day (BID) | ORAL | Status: DC
  Administered 2015-05-01 – 2015-05-02 (×3): 40 mg via ORAL
  Filled 2015-05-01 (×3): qty 1

## 2015-05-01 MED ORDER — PERFLUTREN LIPID MICROSPHERE
1.0000 mL | INTRAVENOUS | Status: AC | PRN
  Administered 2015-05-01: 2 mL via INTRAVENOUS
  Filled 2015-05-01: qty 10

## 2015-05-01 MED ORDER — CARVEDILOL 6.25 MG PO TABS
6.2500 mg | ORAL_TABLET | Freq: Two times a day (BID) | ORAL | Status: DC
  Administered 2015-05-01 – 2015-05-02 (×3): 6.25 mg via ORAL
  Filled 2015-05-01 (×3): qty 1

## 2015-05-01 NOTE — Progress Notes (Addendum)
  Echocardiogram 2D Echocardiogram with Definty has been performed.  Jennette Dubin 05/01/2015, 11:48 AM

## 2015-05-01 NOTE — Progress Notes (Signed)
[facility] PROGRESS NOTE  [name] FMB:[id number] DOB: 1932-06-14 DOA: 04/30/2015 PCP: Martinique, BETTY G, MD  Assessment/Plan: 1. Acute on chronic CHF. Systolic HF. Echo. LVEF 25-30% (01/2015). H/o Cardiomyopathy s/p AICD (04/22/15) -Will cont IV lasix 40 mg BB, hydralzine, NTG, also he is on ACE. Monitor I/O, urine output.f/u with echocardiogram 2. CAD h/o CABG. Still No acute chest pains. Trop 0.05->?likely due to cardiomyopathy -cont BB, statins. Pt is not on ASA. Obtain serial trop. Defer management to cardiology.  3. HTN. Not at goal. Cont home regimen. Titrate as needed 4. PAF. On amiodarone, apixaban   Code Status: full Family Communication: no family at bedside Disposition Plan: With continued improvement we'll plan on discharging within the next one or 2 days   Consultants:  Cardiology  Procedures:  Echo  Antibiotics:  None  HPI/Subjective: Patient reports feeling better  Objective: Filed Vitals:   05/01/15 0610 05/01/15 1230  BP: 146/74 131/61  Pulse: 68 63  Temp: 97.5 F (36.4 C) 97.7 F (36.5 C)  Resp: 18 18    Intake/Output Summary (Last 24 hours) at 05/01/15 1711 Last data filed at 05/01/15 1700  Gross per 24 hour  Intake    720 ml  Output   3400 ml  Net  -2680 ml   Filed Weights   04/30/15 1310 04/30/15 2129 05/01/15 0610  Weight: 95.255 kg (210 lb) 90.719 kg (200 lb) 89.268 kg (196 lb 12.8 oz)    Exam:   General:  Patient in no acute distress, alert and awake  Cardiovascular: Regular rate and rhythm, no mubs  Respiratory: No increased work of breathing, equal chest rise, no wheezes  Abdomen: Soft, nondistended, nontender  Musculoskeletal: No cyanosis or clubbing  Data Reviewed: Basic Metabolic Panel:  Recent Labs Lab 04/30/15 1312 05/01/15 0536  NA 133* 138  K 4.0 3.8  CL 100* 101  CO2 20* 27  GLUCOSE 149* 101*  BUN 23* 22*  CREATININE 1.57* 1.58*  CALCIUM 9.0 8.7*   Liver Function Tests: No results for  input(s): AST, ALT, ALKPHOS, BILITOT, PROT, ALBUMIN in the last 168 hours. No results for input(s): LIPASE, AMYLASE in the last 168 hours. No results for input(s): AMMONIA in the last 168 hours. CBC:  Recent Labs Lab 04/30/15 1312  WBC 10.3  HGB 13.3  HCT 39.1  MCV 86.9  PLT 265   Cardiac Enzymes:  Recent Labs Lab 04/30/15 1545 04/30/15 2335 05/01/15 0536 05/01/15 1145  TROPONINI 0.05* 0.06* 0.05* 0.05*   BNP (last 3 results)  Recent Labs  08/22/14 2044 04/30/15 1545  BNP 592.3* 3478.3*    ProBNP (last 3 results) No results for input(s): PROBNP in the last 8760 hours.  CBG: No results for input(s): GLUCAP in the last 168 hours.  Recent Results (from the past 240 hour(s))  Surgical pcr screen     Status: Abnormal   Collection Time: 04/22/15  8:40 AM  Result Value Ref Range Status   MRSA, PCR POSITIVE (A) NEGATIVE Final    Comment: RESULT CALLED TO, READ BACK BY AND VERIFIED WITH: Marilu Favre RN 11:25 04/22/15 ([email or web address])    Staphylococcus aureus POSITIVE (A) NEGATIVE Final    Comment:        The Xpert SA Assay (FDA approved for NASAL specimens in patients over 57 years of age), is one component of a comprehensive surveillance program.  Test performance has been validated by [facility] for patients greater than or equal to 4 year old. It is not intended  to diagnose infection nor to guide or monitor treatment.      Studies: Dg Chest 2 View  04/30/2015  CLINICAL DATA:  82 year old male with shortness of breath. Recent appear burr later placement. Initial encounter. EXAM: CHEST  2 VIEW COMPARISON:  04/23/2015 and earlier. FINDINGS: Increased small bilateral pleural effusions. No acute pulmonary edema. Stable cardiomegaly and mediastinal contours. Sequelae of CABG. Left chest 3 lead cardiac pacemaker/ AICD appears stable. No pneumothorax. Visualized tracheal air column is within normal limits. No acute osseous abnormality identified. Calcified aortic  atherosclerosis. IMPRESSION: Increased small bilateral pleural effusions. No acute pulmonary edema. Electronically Signed   By: Genevie Ann M.D.   On: 04/30/2015 14:37    Scheduled Meds: . amiodarone  200 mg Oral Daily  . apixaban  2.5 mg Oral BID  . atorvastatin  20 mg Oral q1800  . carvedilol  6.25 mg Oral BID WC  . famotidine  10 mg Oral QHS  . furosemide  40 mg Oral BID  . hydrALAZINE  12.5 mg Oral BID AC  . isosorbide mononitrate  30 mg Oral Daily  . lisinopril  20 mg Oral QPM  . magnesium oxide  400 mg Oral QPM  . multivitamin with minerals  1 tablet Oral Daily  . omega-3 acid ethyl esters  2 g Oral BID  . sodium chloride  3 mL Intravenous Q12H   Continuous Infusions:   Active Problems:  Time spent: > 35 minutes    Velvet Bathe  [facility] Pager [phone]. If 7PM-7AM, please contact night-coverage at [email or web address], password [facility] 05/01/2015, 5:11 PM  LOS: 1 day

## 2015-05-01 NOTE — Consult Note (Signed)
Primary Care Physician: [place], BETTY G, MD Referring Physician:  Admit Date: 04/30/2015  Reason for consultation:  [name] is a 82 y.o. male with a h/o hypertension, aortic stenosis status post AVR, chronic combined systolic and diastolic CHF in the setting of A. fib with RVR, orthostatic hypotension, dilated cardiomyopathy EF 25-30%, CAD status post CABG, and paroxysmal atrial fibrillation.  He presents today with shortness of breath. He's been on medical therapy with beta blockers hydralazine and Imdur as well as Lasix for the last 3 months. He is on amiodarone for his atrial fibrillation. He says that over night he awoke at 2 in the morning acutely short of breath. He has been doing this for the last few weeks, and taking Lasix at night. The Lasix did not have the desired effect and so he presented to the hospital. His symptoms did seem to worsen after implant of his CRT-D. He has not had any changes in his weight but he has had a change in his abdominal girth.  Past Medical History  Diagnosis Date  . Heart murmur   . Hypercholesteremia   . HTN (hypertension)   . Diastolic CHF ([place])     2D echo 12/2010 - nl LVF, mild LAE, MAC with moderately thickened MV leaflets and trivial MR, normal functioning prosthetic AVR with mild increased gradient, grade II diastolic dysfunction   . Hypotension   . Orthostatic hypotension   . Aortic valve disorder 2009    severe AS s/p AVR with pericardial tissue valve  . Aortic stenosis   . Barrett esophagus   . CAD (coronary artery disease)     s/p 2 vessel CABG with LIMA to LAD, SVG to diagonal 1, SVG to RCA 12/09  . Cardiomyopathy ([place])   . H/O: rheumatic fever   . Atrial fibrillation ([place])     post op afib with no reoccurence  . Cardiomyopathy ([place])     a. Echo 5/16:  mild LVH, EF 30-35%, diff HK, AVR ok, mild MR, severe LAE, mod RAE, reduced RVSF.  Repeat limited echo 10/2014 with EF 25-30%  . History of PFTs     a. Amiodarone started 5/16 >>  PFTs w/ DLCO 6/16:  FEV1 72% predicted, FEV1/FVC 66%, DLCO 66% >> minimal reversible obstructive airways disease with mild diffusion defect (suggestive of emphysema but absence of hyperinflation inconsistent with dx)  . AICD (automatic cardioverter/defibrillator) present   . Asthma     "a touch"  . Diabetes ([place])     "I'm prediabetic" (04/22/2015)  . GERD (gastroesophageal reflux disease)   . History of hiatal hernia   . Hepatitis 1957    "don't know what kind:  . Arthritis     "minor everywhere" (04/22/2015)  . CKD (chronic kidney disease)   . Pneumonia 08/2014    "dr thought I may have had a touch"   Past Surgical History  Procedure Laterality Date  . Coronary artery bypass graft  03/10/2008    x 3 Dr. Roxan Hockey  . Aortic valve replacement  with 23-mm Magna Ease pericardial valve, model number    with 23-mm Magna Ease pericardial valve, model [id number], serial number [id number]   . Cataract extraction w/ intraocular lens  implant, bilateral Bilateral   . Cardioversion N/A 01/27/2013    Procedure: CARDIOVERSION;  Surgeon: Sueanne Margarita, MD;  Location: [facility];  Service: Cardiovascular;  Laterality: N/A;  . Cardioversion N/A 08/25/2014    Procedure: CARDIOVERSION;  Surgeon: Sanda Klein, MD;  Location: [facility];  Service: Cardiovascular;  Laterality: N/A;  . Cardiac catheterization N/A 11/04/2014    Procedure: Left Heart Cath and Cors/Grafts Angiography;  Surgeon: Leonie Man, MD;  Location: [facility];  Service: Cardiovascular;  Laterality: N/A;  . Bi-ventricular implantable cardioverter defibrillator  (crt-d)  04/22/2015  . Tonsillectomy    . Appendectomy  1940s  . Cardiac valve replacement    . Esophagogastroduodenoscopy (egd) with esophageal dilation  X1  . Ep implantable device N/A 04/22/2015    Procedure: BiV ICD Insertion CRT-D;  Surgeon: [name], MD;  Location: [facility];  Service: Cardiovascular;  Laterality: N/A;    . amiodarone   200 mg Oral Daily  . apixaban  2.5 mg Oral BID  . atorvastatin  20 mg Oral q1800  . carvedilol  6.25 mg Oral BID WC  . famotidine  10 mg Oral QHS  . furosemide  40 mg Intravenous BID  . hydrALAZINE  12.5 mg Oral BID AC  . isosorbide mononitrate  30 mg Oral Daily  . lisinopril  20 mg Oral QPM  . magnesium oxide  400 mg Oral QPM  . multivitamin with minerals  1 tablet Oral Daily  . omega-3 acid ethyl esters  2 g Oral BID  . sodium chloride  3 mL Intravenous Q12H      Allergies  Allergen Reactions  . Novocain [Procaine] Swelling    Social History   Social History  . Marital Status: Married    Spouse Name: N/A  . Number of Children: N/A  . Years of Education: N/A   Occupational History  . Not on file.   Social History Main Topics  . Smoking status: Never Smoker   . Smokeless tobacco: Never Used  . Alcohol Use: Yes     Comment: 04/22/2015 "nothing since the 1980s; never had a problem w/it"  . Drug Use: No  . Sexual Activity: No   Other Topics Concern  . Not on file   Social History Narrative    Family History  Problem Relation Age of Onset  . Alzheimer's disease    . Alzheimer's disease Mother     ROS- All systems are reviewed and negative except as per the HPI above  Physical Exam: Telemetry: Filed Vitals:   04/30/15 2100 04/30/15 2129 05/01/15 0135 05/01/15 0610  BP: 152/67 152/67 144/62 146/74  Pulse: 65 65 66 68  Temp:  97.4 F (36.3 C) 97.6 F (36.4 C) 97.5 F (36.4 C)  TempSrc:  Oral Oral Oral  Resp: _0 Height:  6' (1.829 m)    Weight:  200 lb (90.719 kg)  196 lb 12.8 oz (89.268 kg)  SpO2: 98% 96% 97% 97%    GEN- The patient is well appearing, alert and oriented x 3 today.   Head- normocephalic, atraumatic Eyes-  Sclera clear, conjunctiva pink Ears- hearing intact Oropharynx- clear Neck- supple, no JVP Lymph- no cervical lymphadenopathy Lungs- Clear to ausculation bilaterally, normal work of breathing Heart- Regular rate and  rhythm, no murmurs, rubs or gallops, PMI not laterally displaced GI- soft, NT, ND, + BS Extremities- no clubbing, cyanosis, or edema MS- no significant deformity or atrophy Skin- no rash or lesion Psych- euthymic mood, full affect Neuro- strength and sensation are intact  EKG-  Labs:   Lab Results  Component Value Date   WBC 10.3 04/30/2015   HGB 13.3 04/30/2015   HCT 39.1 04/30/2015   MCV 86.9 04/30/2015   PLT 265 04/30/2015  Recent Labs Lab 05/01/15 0536  NA 138  K 3.8  CL 101  CO2 27  BUN 22*  CREATININE 1.58*  CALCIUM 8.7*  GLUCOSE 101*   Lab Results  Component Value Date   TROPONINI 0.05* 05/01/2015    Lab Results  Component Value Date   CHOL 123* 01/19/2015   CHOL 93* 06/16/2014   CHOL 107 12/11/2013   Lab Results  Component Value Date   HDL 45 01/19/2015   HDL 34* 06/16/2014   HDL 36* 12/11/2013   Lab Results  Component Value Date   LDLCALC 46 01/19/2015   LDLCALC 34 06/16/2014   LDLCALC 31 12/11/2013   Lab Results  Component Value Date   TRIG 161* 01/19/2015   TRIG 124 06/16/2014   TRIG 200* 12/11/2013   Lab Results  Component Value Date   CHOLHDL 2.7 01/19/2015   No results found for: [facility]    Radiology: Increased small bilateral pleural effusions. No acute pulmonary edema.  Echo: 02/02/15 - Left ventricle: The cavity size was mildly dilated. Systolic function was severely reduced. The estimated ejection fraction was in the range of 25% to 30%. There is akinesis of the basal-midinferoseptal myocardium. There is akinesis of the entireapical myocardium. There is akinesis of the mid-apicallateral myocardium. There is akinesis of the mid-apicalinferior myocardium. There is severe hypokinesis of the entireinferolateral myocardium. There is akinesis of the apicalanterior myocardium. There is akinesis of the midanteroseptal myocardium. Features are consistent with a pseudonormal left ventricular filling  pattern, with concomitant abnormal relaxation and increased filling pressure (grade 2 diastolic dysfunction). Doppler parameters are consistent with high ventricular filling pressure. - Aortic valve: Poorly visualized. A bioprosthesis was present and functioning normally. - Mitral valve: Calcified annulus. Mild diffuse calcification of the anterior leaflet. There was mild regurgitation. - Left atrium: The atrium was severely dilated. - Tricuspid valve: There was trivial regurgitation.  ASSESSMENT AND PLAN:   1. Acute on chronic systolic heart failure exacerbation: Doing well with diuresis since admission and is 2.5 L net out. His volume status seems to be close to normal and we'll switch him to by mouth Lasix. I've spoken with the team from [employer] and they're going to come and interrogate his device and possibly adjust his CRT-D settings.  Switching him to PO lasix BID today as he seems to be more euvolemic.  2. Acute on chronic kidney disease - I suspect creatinine is elevated due to CHF, improving with diuresis 3. ASCAD s/p CABG with no angina 4. AS s/p pericardial tissue AVR 5. PAF maintaining NSR on Amio/BB. Continue Eliquis (adjusted for renal insuff)    [name], MD 05/01/2015  10:35 AM

## 2015-05-02 ENCOUNTER — Ambulatory Visit: Payer: Commercial Managed Care - HMO

## 2015-05-02 ENCOUNTER — Other Ambulatory Visit: Payer: Self-pay

## 2015-05-02 DIAGNOSIS — I5041 Acute combined systolic (congestive) and diastolic (congestive) heart failure: Secondary | ICD-10-CM

## 2015-05-02 DIAGNOSIS — Z950 Presence of cardiac pacemaker: Secondary | ICD-10-CM

## 2015-05-02 DIAGNOSIS — N179 Acute kidney failure, unspecified: Secondary | ICD-10-CM

## 2015-05-02 DIAGNOSIS — I480 Paroxysmal atrial fibrillation: Secondary | ICD-10-CM

## 2015-05-02 DIAGNOSIS — N183 Chronic kidney disease, stage 3 (moderate): Secondary | ICD-10-CM

## 2015-05-02 LAB — BASIC METABOLIC PANEL
ANION GAP: 11 (ref 5–15)
BUN: 25 mg/dL — AB (ref 6–20)
CALCIUM: 8.6 mg/dL — AB (ref 8.9–10.3)
CO2: 26 mmol/L (ref 22–32)
Chloride: 100 mmol/L — ABNORMAL LOW (ref 101–111)
Creatinine, Ser: 1.8 mg/dL — ABNORMAL HIGH (ref 0.61–1.24)
GFR calc Af Amer: 39 mL/min — ABNORMAL LOW (ref >60)
GFR, EST NON AFRICAN AMERICAN: 34 mL/min — AB (ref >60)
GLUCOSE: 104 mg/dL — AB (ref 65–99)
Potassium: 4.2 mmol/L (ref 3.5–5.1)
Sodium: 137 mmol/L (ref 135–145)

## 2015-05-02 LAB — CBC
HEMATOCRIT: 38.7 % — AB (ref 39.0–52.0)
Hemoglobin: 12.9 g/dL — ABNORMAL LOW (ref 13.0–17.0)
MCH: 29.1 pg (ref 26.0–34.0)
MCHC: 33.3 g/dL (ref 30.0–36.0)
MCV: 87.4 fL (ref 78.0–100.0)
PLATELETS: 240 10*3/uL (ref 150–400)
RBC: 4.43 MIL/uL (ref 4.22–5.81)
RDW: 14.6 % (ref 11.5–15.5)
WBC: 10 10*3/uL (ref 4.0–10.5)

## 2015-05-02 MED ORDER — FUROSEMIDE 20 MG PO TABS: 20.0000 mg | ORAL_TABLET | Freq: Every day | ORAL | Status: DC

## 2015-05-02 NOTE — Discharge Summary (Signed)
Physician Discharge Summary  [name] OTL:[id number] DOB: [birth year] DOA: 04/30/2015  PCP: [place], BETTY G, MD  Admit date: 04/30/2015 Discharge date: 05/02/2015  Time spent: > 35 minutes  Recommendations for Outpatient Follow-up:  1.   Discharge Diagnoses:  Active Problems:   Acute CHF (congestive heart failure) ([place])   Discharge Condition: stable  Diet recommendation: Heart healthy  Filed Weights   04/30/15 2129 05/01/15 0610 05/02/15 0606  Weight: 90.719 kg (200 lb) 89.268 kg (196 lb 12.8 oz) 87.998 kg (194 lb)    History of present illness:  82 y/o male with PMH of HTN, CAD h/op CABG, s/p AVR, CKD, CHF, Cardiomyopathy s/p AICD (04/22/15) presented with progressive SOB. Patient reported increasing shortness of breath associated with PND and orthopnea for several days. He also reports DOE, and pedal edema  Hospital Course:  1. Acute on chronic CHF. Systolic HF. Echo. LVEF 25-30% (01/2015). H/o Cardiomyopathy s/p AICD (04/22/15) - Cardiology recommended lasix 20 mg po daily and reassessing BMP in one week. 2. CAD h/o CABG.  - after evaluation by cardiology patient was cleared for discharge. Is to follow-up with cardiologist in the next one or 2 weeks 3. HTN. Stable 4. PAF. On amiodarone, apixaban   Procedures:  Echocardiogram  Consultations:  Cardiology  Discharge Exam: Filed Vitals:   05/02/15 0855 05/02/15 1100  BP: 133/56 135/63  Pulse: 62 64  Temp: 97.6 F (36.4 C) 97.9 F (36.6 C)  Resp: 20 19    General: Patient in no acute distress, alert and awake Cardiovascular: Regular rate and rhythm, no rubs Respiratory: No increased work of breathing, no wheezes  Discharge Instructions   Discharge Instructions    Call MD for:  redness, tenderness, or signs of infection (pain, swelling, redness, odor or green/yellow discharge around incision site)    Complete by:  As directed      Call MD for:  severe uncontrolled pain    Complete by:  As directed      Call MD for:  temperature >100.4    Complete by:  As directed      Diet - low sodium heart healthy    Complete by:  As directed      Discharge instructions    Complete by:  As directed   Please follow up with your cardiologist in 1 week. You will need to have a BMP reassessed at that time.     Increase activity slowly    Complete by:  As directed           Current Discharge Medication List    CONTINUE these medications which have CHANGED   Details  furosemide (LASIX) 20 MG tablet Take 1 tablet (20 mg total) by mouth daily. Qty: 30 tablet, Refills: 0   Associated Diagnoses: Coronary artery disease due to lipid rich plaque; Chronic combined systolic and diastolic heart failure ([place]); Essential hypertension; Paroxysmal atrial fibrillation ([place]); Aortic stenosis, severe; Dyslipidemia      CONTINUE these medications which have NOT CHANGED   Details  acetaminophen (TYLENOL) 500 MG tablet Take 500 mg by mouth every 6 (six) hours as needed for moderate pain.    amiodarone (PACERONE) 200 MG tablet Take 1 tablet (200 mg total) by mouth daily. Qty: 90 tablet, Refills: 3   Associated Diagnoses: Paroxysmal atrial fibrillation ([facility])    apixaban (ELIQUIS) 2.5 MG TABS tablet Take 1 tablet (2.5 mg total) by mouth 2 (two) times daily. Qty: 180 tablet, Refills: 3    atorvastatin (LIPITOR) 20  MG tablet Take 20 mg by mouth daily at 6 PM.     carvedilol (COREG) 6.25 MG tablet Take 1 tablet (6.25 mg total) by mouth 2 (two) times daily with a meal. Qty: 180 tablet, Refills: 3    hydrALAZINE (APRESOLINE) 25 MG tablet Take 0.5 tablets (12.5 mg total) by mouth 2 (two) times daily before a meal. Qty: 270 tablet, Refills: 3    isosorbide mononitrate (IMDUR) 30 MG 24 hr tablet Take 1 tablet (30 mg total) by mouth daily. Qty: 90 tablet, Refills: 3   Associated Diagnoses: Coronary artery disease due to lipid rich plaque; Chronic combined systolic and diastolic heart failure ([place]); Essential hypertension;  Paroxysmal atrial fibrillation ([place]); Aortic stenosis, severe; Dyslipidemia    lisinopril (PRINIVIL,ZESTRIL) 10 MG tablet Take 20 mg by mouth every evening.  Qty: 30 tablet, Refills: 11    Magnesium 250 MG TABS Take 1 tablet by mouth every evening.     Multiple Vitamin (MULTIVITAMIN) capsule Take 1 capsule by mouth daily.    omega-3 acid ethyl esters (LOVAZA) 1 G capsule Take 2 g by mouth 2 (two) times daily.    ranitidine (ZANTAC) 300 MG tablet Take 300 mg by mouth at bedtime.       Allergies  Allergen Reactions  . Novocain [Procaine] Swelling      The results of significant diagnostics from this hospitalization (including imaging, microbiology, ancillary and laboratory) are listed below for reference.    Significant Diagnostic Studies: Dg Chest 2 View  04/30/2015  CLINICAL DATA:  82 year old male with shortness of breath. Recent appear burr later placement. Initial encounter. EXAM: CHEST  2 VIEW COMPARISON:  04/23/2015 and earlier. FINDINGS: Increased small bilateral pleural effusions. No acute pulmonary edema. Stable cardiomegaly and mediastinal contours. Sequelae of CABG. Left chest 3 lead cardiac pacemaker/ AICD appears stable. No pneumothorax. Visualized tracheal air column is within normal limits. No acute osseous abnormality identified. Calcified aortic atherosclerosis. IMPRESSION: Increased small bilateral pleural effusions. No acute pulmonary edema. Electronically Signed   By: Genevie Ann M.D.   On: 04/30/2015 14:37   Dg Chest 2 View  04/23/2015  CLINICAL DATA:  Placement of cardiac device. EXAM: CHEST  2 VIEW COMPARISON:  08/22/2014 FINDINGS: Interval placement LEFT-sided pacemaker with 3 continuous leads. Sternotomy wires overlie normal cardiac silhouette. No effusion, infiltrate, pneumothorax. IMPRESSION: LEFT-sided pacer place without complication. Electronically Signed   By: Suzy Bouchard M.D.   On: 04/23/2015 11:55    Microbiology: No results found for this or any  previous visit (from the past 240 hour(s)).   Labs: Basic Metabolic Panel:  Recent Labs Lab 04/30/15 1312 05/01/15 0536 05/02/15 0033  NA 133* 138 137  K 4.0 3.8 4.2  CL 100* 101 100*  CO2 20* 27 26  GLUCOSE 149* 101* 104*  BUN 23* 22* 25*  CREATININE 1.57* 1.58* 1.80*  CALCIUM 9.0 8.7* 8.6*   Liver Function Tests: No results for input(s): AST, ALT, ALKPHOS, BILITOT, PROT, ALBUMIN in the last 168 hours. No results for input(s): LIPASE, AMYLASE in the last 168 hours. No results for input(s): AMMONIA in the last 168 hours. CBC:  Recent Labs Lab 04/30/15 1312 05/02/15 0033  WBC 10.3 10.0  HGB 13.3 12.9*  HCT 39.1 38.7*  MCV 86.9 87.4  PLT 265 240   Cardiac Enzymes:  Recent Labs Lab 04/30/15 1545 04/30/15 2335 05/01/15 0536 05/01/15 1145  TROPONINI 0.05* 0.06* 0.05* 0.05*   BNP: BNP (last 3 results)  Recent Labs  08/22/14 2044 04/30/15 1545  BNP 592.3* 3478.3*    ProBNP (last 3 results) No results for input(s): PROBNP in the last 8760 hours.  CBG: No results for input(s): GLUCAP in the last 168 hours.   Signed:  Velvet Bathe MD.  [facility] 05/02/2015, 2:34 PM

## 2015-05-02 NOTE — Progress Notes (Signed)
Patient Name: [name] Date of Encounter: 05/02/2015  Active Problems:   Acute CHF (congestive heart failure) ([place])   Length of Stay: 2  SUBJECTIVE  The patient feels batter and back to baseline.   CURRENT MEDS . amiodarone  200 mg Oral Daily  . apixaban  2.5 mg Oral BID  . atorvastatin  20 mg Oral q1800  . carvedilol  6.25 mg Oral BID WC  . famotidine  10 mg Oral QHS  . furosemide  40 mg Oral BID  . hydrALAZINE  12.5 mg Oral BID AC  . isosorbide mononitrate  30 mg Oral Daily  . lisinopril  20 mg Oral QPM  . magnesium oxide  400 mg Oral QPM  . multivitamin with minerals  1 tablet Oral Daily  . omega-3 acid ethyl esters  2 g Oral BID  . sodium chloride  3 mL Intravenous Q12H   OBJECTIVE  Filed Vitals:   05/02/15 0101 05/02/15 0606 05/02/15 0855 05/02/15 1100  BP: 138/75 133/72 133/56 135/63  Pulse: 60 59 62 64  Temp: 97.2 F (36.2 C) 97.6 F (36.4 C) 97.6 F (36.4 C) 97.9 F (36.6 C)  TempSrc: Oral Oral Oral Oral  Resp: _0 Height:      Weight:  194 lb (87.998 kg)    SpO2: 97% 95% 97% 97%    Intake/Output Summary (Last 24 hours) at 05/02/15 1143 Last data filed at 05/02/15 1100  Gross per 24 hour  Intake   1560 ml  Output   2626 ml  Net  -1066 ml   Filed Weights   04/30/15 2129 05/01/15 0610 05/02/15 0606  Weight: 200 lb (90.719 kg) 196 lb 12.8 oz (89.268 kg) 194 lb (87.998 kg)    PHYSICAL EXAM  General: Pleasant, NAD. Neuro: Alert and oriented X 3. Moves all extremities spontaneously. Psych: Normal affect. HEENT:  Normal  Neck: Supple without bruits or JVD. Lungs:  Resp regular and unlabored, CTA. Heart: RRR no s3, s4, or murmurs. Abdomen: Soft, non-tender, non-distended, BS + x 4.  Extremities: No clubbing, cyanosis or edema. DP/PT/Radials 2+ and equal bilaterally.  Accessory Clinical Findings  CBC  Recent Labs  04/30/15 1312 05/02/15 0033  WBC 10.3 10.0  HGB 13.3 12.9*  HCT 39.1 38.7*  MCV 86.9 87.4  PLT 265 240     Basic Metabolic Panel  Recent Labs  05/01/15 0536 05/02/15 0033  NA 138 137  K 3.8 4.2  CL 101 100*  CO2 27 26  GLUCOSE 101* 104*  BUN 22* 25*  CREATININE 1.58* 1.80*  CALCIUM 8.7* 8.6*    Recent Labs  04/30/15 2335 05/01/15 0536 05/01/15 1145  TROPONINI 0.06* 0.05* 0.05*   Radiology/Studies  Dg Chest 2 View  04/30/2015  CLINICAL DATA:  82 year old male with shortness of breath. Recent appear burr later placement. Initial encounter. EXAM: CHEST  2 VIEW COMPARISON:  04/23/2015 and earlier. FINDINGS: Increased small bilateral pleural effusions. No acute pulmonary edema. Stable cardiomegaly and mediastinal contours. Sequelae of CABG. Left chest 3 lead cardiac pacemaker/ AICD appears stable. No pneumothorax. Visualized tracheal air column is within normal limits. No acute osseous abnormality identified. Calcified aortic atherosclerosis. IMPRESSION: Increased small bilateral pleural effusions. No acute pulmonary edema.   TELE: SR    ASSESSMENT AND PLAN   1. Acute on chronic systolic heart failure exacerbation: Doing well with diuresis since admission and is 3.5 L net out. Appears euvolemic today. Now on PO lasix, I would further  decrease to 20 mg po daily and schedule an early follow up in the clinic, he has a wound check up on 1/26, I would schedule another in 2 weeks. He had a PM interrogated yesterday ([employer]).   2. Acute on chronic kidney disease - I suspect creatinine is elevated due to CHF, improved with diuresis, but now worsening, recheck in 2 weeks.  3. ASCAD s/p CABG with no angina 4. AS s/p pericardial tissue AVR 5. PAF maintaining NSR on Amio/BB. Continue Eliquis (adjusted for renal insuff)  He can be discharged today with Lasix 20 mg po daily.   Signed, Dorothy Spark MD, [facility] 05/02/2015

## 2015-05-02 NOTE — Clinical Documentation Improvement (Signed)
Hospitalist  Can the diagnosis of CKD be further specified?   CKD Stage I - GFR greater than or equal to 90  CKD Stage II - GFR 60-89  CKD Stage III - GFR 30-59  CKD Stage IV - GFR 15-29  Other condition  Unable to clinically determine   Supporting Information: : (risk factors, signs and symptoms, diagnostics, treatment) Patient with a history of CKD per 01/21 progress notes.  Labs:    Bun        Creat        GFR: 01/22:    22           1.58          40 01/21:    23           1.57          40  Please exercise your independent, professional judgment when responding. A specific answer is not anticipated or expected.   Thank You, [place] [phone]

## 2015-05-02 NOTE — Discharge Summary (Signed)
Pt got discharged to home, discharge instructions provided and patient showed understanding to it, IV taken out,Telemonitor DC,pt left unit in wheelchair with all of the belongings accompanied with a family member (son)

## 2015-05-03 ENCOUNTER — Other Ambulatory Visit: Payer: Self-pay | Admitting: *Deleted

## 2015-05-05 ENCOUNTER — Encounter: Payer: Self-pay | Admitting: Cardiology

## 2015-05-05 ENCOUNTER — Ambulatory Visit (INDEPENDENT_AMBULATORY_CARE_PROVIDER_SITE_OTHER): Payer: Commercial Managed Care - HMO | Admitting: *Deleted

## 2015-05-05 DIAGNOSIS — Z95810 Presence of automatic (implantable) cardiac defibrillator: Secondary | ICD-10-CM

## 2015-05-05 DIAGNOSIS — I5042 Chronic combined systolic (congestive) and diastolic (congestive) heart failure: Secondary | ICD-10-CM

## 2015-05-05 LAB — CUP PACEART INCLINIC DEVICE CHECK
Battery Voltage: 3.09 V
Brady Statistic AP VP Percent: 61.16 %
Brady Statistic AP VS Percent: 1.06 %
Brady Statistic AS VP Percent: 36.53 %
Brady Statistic RA Percent Paced: 62.22 %
Brady Statistic RV Percent Paced: 97.14 %
Date Time Interrogation Session: 20170126175547
HighPow Impedance: 58 Ohm
Implantable Lead Implant Date: 20170113
Implantable Lead Implant Date: 20170113
Implantable Lead Location: 753859
Lead Channel Impedance Value: 304 Ohm
Lead Channel Impedance Value: 323 Ohm
Lead Channel Impedance Value: 323 Ohm
Lead Channel Impedance Value: 342 Ohm
Lead Channel Impedance Value: 342 Ohm
Lead Channel Impedance Value: 532 Ohm
Lead Channel Impedance Value: 532 Ohm
Lead Channel Impedance Value: 570 Ohm
Lead Channel Pacing Threshold Amplitude: 0.75 V
Lead Channel Pacing Threshold Pulse Width: 0.4 ms
Lead Channel Sensing Intrinsic Amplitude: 3.25 mV
Lead Channel Setting Pacing Amplitude: 2.5 V
Lead Channel Setting Pacing Pulse Width: 0.4 ms
Lead Channel Setting Pacing Pulse Width: 0.4 ms
MDC IDC LEAD IMPLANT DT: 20170113
MDC IDC LEAD LOCATION: 753858
MDC IDC LEAD LOCATION: 753860
MDC IDC LEAD MODEL: 4298
MDC IDC MSMT BATTERY REMAINING LONGEVITY: 92 mo
MDC IDC MSMT LEADCHNL LV IMPEDANCE VALUE: 380 Ohm
MDC IDC MSMT LEADCHNL LV IMPEDANCE VALUE: 532 Ohm
MDC IDC MSMT LEADCHNL LV IMPEDANCE VALUE: 532 Ohm
MDC IDC MSMT LEADCHNL LV PACING THRESHOLD AMPLITUDE: 0.75 V
MDC IDC MSMT LEADCHNL LV PACING THRESHOLD PULSEWIDTH: 0.4 ms
MDC IDC MSMT LEADCHNL RA IMPEDANCE VALUE: 494 Ohm
MDC IDC MSMT LEADCHNL RA PACING THRESHOLD AMPLITUDE: 0.75 V
MDC IDC MSMT LEADCHNL RA PACING THRESHOLD PULSEWIDTH: 0.4 ms
MDC IDC MSMT LEADCHNL RV IMPEDANCE VALUE: 456 Ohm
MDC IDC MSMT LEADCHNL RV SENSING INTR AMPL: 9.5 mV
MDC IDC SET LEADCHNL RA PACING AMPLITUDE: 3.5 V
MDC IDC SET LEADCHNL RV PACING AMPLITUDE: 3.5 V
MDC IDC SET LEADCHNL RV SENSING SENSITIVITY: 0.3 mV
MDC IDC STAT BRADY AS VS PERCENT: 1.25 %

## 2015-05-05 NOTE — Progress Notes (Signed)
Wound check appointment. Steri-strips removed. Wound without redness or edema. Incision edges approximated, wound well healed. Normal device function. Thresholds, sensing, and impedances consistent with implant measurements. Patient bi-ventricularly pacing 97.3%, VSRP 2.4%. Device programmed at 3.5V for extra safety margin until 3 month visit. Histogram distribution appropriate for patient and level of activity. No mode switches or ventricular arrhythmias noted. Patient educated about wound care, arm mobility, lifting restrictions, shock plan. ROV in 3 months with WC.

## 2015-05-06 ENCOUNTER — Other Ambulatory Visit: Payer: Self-pay | Admitting: *Deleted

## 2015-05-06 ENCOUNTER — Encounter: Payer: Self-pay | Admitting: *Deleted

## 2015-05-06 NOTE — Patient Outreach (Addendum)
[facility]) Care Management   05/06/2015  ZION TA [birth year] [id number]  [name] is an 82 y.o. male  Subjective: [name] is status post hospitalization for CHF and BiV ICD placement. He feels he is doing quite well. He has received a CHF self management folder. He is weighing every day, trying to follow a low sodium diet, takes his meds as prescribed. He is usually very active when the weather is nice in his yard or in his workshop.  Objective:  BP 154/72 mmHg  Pulse 62  Ht 1.829 m (6')  Wt 199 lb 9.6 oz (90.538 kg)  BMI 27.06 kg/m2  SpO2 98%                       Review of Systems  Constitutional: Negative.   HENT: Negative.   Eyes: Negative.   Respiratory: Negative.   Cardiovascular: Negative.   Gastrointestinal: Negative.   Genitourinary: Negative.   Musculoskeletal: Negative.   Skin: Negative.   Neurological: Negative.   Endo/Heme/Allergies: Negative.   Psychiatric/Behavioral: Negative.     Physical Exam  Constitutional: He is oriented to person, place, and time. He appears well-developed and well-nourished.  HENT:  Head: Normocephalic.  Cardiovascular: Normal rate, regular rhythm and normal heart sounds.   Respiratory: Effort normal and breath sounds normal.  GI: Soft. Bowel sounds are normal.  Musculoskeletal: Normal range of motion.  Neurological: He is alert and oriented to person, place, and time.  Skin: Skin is warm and dry.  Psychiatric: He has a normal mood and affect.            Current Medications:   Current Outpatient Prescriptions  Medication Sig Dispense Refill  . acetaminophen (TYLENOL) 500 MG tablet Take 500 mg by mouth every 6 (six) hours as needed for moderate pain.    Marland Kitchen amiodarone (PACERONE) 200 MG tablet Take 1 tablet (200 mg total) by mouth daily. 90 tablet 3  . apixaban (ELIQUIS) 2.5 MG TABS tablet Take 1 tablet (2.5 mg total) by mouth 2 (two) times daily. 180 tablet 3  . atorvastatin (LIPITOR) 20 MG  tablet Take 20 mg by mouth daily at 6 PM.     . carvedilol (COREG) 6.25 MG tablet Take 1 tablet (6.25 mg total) by mouth 2 (two) times daily with a meal. 180 tablet 3  . furosemide (LASIX) 20 MG tablet Take 1 tablet (20 mg total) by mouth daily. 30 tablet 0  . hydrALAZINE (APRESOLINE) 25 MG tablet Take 0.5 tablets (12.5 mg total) by mouth 2 (two) times daily before a meal. 270 tablet 3  . isosorbide mononitrate (IMDUR) 30 MG 24 hr tablet Take 1 tablet (30 mg total) by mouth daily. 90 tablet 3  . lisinopril (PRINIVIL,ZESTRIL) 10 MG tablet Take 20 mg by mouth every evening.  30 tablet 11  . Magnesium 250 MG TABS Take 1 tablet by mouth every evening.     . Multiple Vitamin (MULTIVITAMIN) capsule Take 1 capsule by mouth daily.    Marland Kitchen omega-3 acid ethyl esters (LOVAZA) 1 G capsule Take 2 g by mouth 2 (two) times daily.    . ranitidine (ZANTAC) 300 MG tablet Take 300 mg by mouth at bedtime.     No current facility-administered medications for this visit.    Functional Status:   In your present state of health, do you have any difficulty performing the following activities: 05/06/2015 04/30/2015  Hearing? - N  Vision? - N  Difficulty concentrating or making decisions? - N  Walking or climbing stairs? - N  Dressing or bathing? - N  Doing errands, shopping? - N  Conservation officer, nature and eating ? N -  Using the Toilet? N -  In the past six months, have you accidently leaked urine? N -  Do you have problems with loss of bowel control? N -  Managing your Medications? N -  Managing your Finances? N -  Housekeeping or managing your Housekeeping? N -    Fall/Depression Screening:    Fall Risk  05/09/2015  Falls in the past year? No   Depression screen PHQ 2/9 05/09/2015  Decreased Interest 0  Down, Depressed, Hopeless 0  PHQ - 2 Score 0    Assessment:  CHF - stable  Plan: Weekly transition of care calls. I will see pt in one month. Advised to review CHF self management sheet daily & call with any  problems in the [place]. Continue good self management habits: daily weight & record in [facility] organization book, take meds, follow low salt diet, activity to tolerance and see MDs when scheduled.  THN CM Care Plan Problem One        Most Recent Value   Care Plan Problem One  CHF   Role Documenting the Problem One  Care Management Coordinator   Care Plan for Problem One  Active   THN CM Short Term Goal #1 (0-30 days)  Pt will not readmit for HF in the next 30 days.   THN CM Short Term Goal #1 Start Date  05/06/15   Interventions for Short Term Goal #1  Reviewed CHF action plan. Call me if any problems.   THN CM Short Term Goal #2 (0-30 days)  See [name] next week for primary visit.   THN CM Short Term Goal #2 Start Date  05/06/15   Interventions for Short Term Goal #2  Reinforced how important follow up MD appts are.   THN CM Short Term Goal #3 (0-30 days)  Pt to weigh daily and record in his [place].   THN CM Short Term Goal #3 Start Date  05/06/15   Interventions for Short Tern Goal #3  Reinforced weighing and writing it down daily,     Deloria Lair [facility] [phone]

## 2015-05-09 ENCOUNTER — Encounter: Payer: Self-pay | Admitting: *Deleted

## 2015-05-11 DIAGNOSIS — N183 Chronic kidney disease, stage 3 (moderate): Secondary | ICD-10-CM | POA: Diagnosis not present

## 2015-05-11 DIAGNOSIS — I1310 Hypertensive heart and chronic kidney disease without heart failure, with stage 1 through stage 4 chronic kidney disease, or unspecified chronic kidney disease: Secondary | ICD-10-CM | POA: Diagnosis not present

## 2015-05-11 DIAGNOSIS — I509 Heart failure, unspecified: Secondary | ICD-10-CM | POA: Diagnosis not present

## 2015-05-11 DIAGNOSIS — K219 Gastro-esophageal reflux disease without esophagitis: Secondary | ICD-10-CM | POA: Diagnosis not present

## 2015-05-11 DIAGNOSIS — R05 Cough: Secondary | ICD-10-CM | POA: Diagnosis not present

## 2015-05-18 DIAGNOSIS — I509 Heart failure, unspecified: Secondary | ICD-10-CM | POA: Diagnosis not present

## 2015-06-03 ENCOUNTER — Ambulatory Visit: Payer: Self-pay | Admitting: *Deleted

## 2015-06-03 ENCOUNTER — Other Ambulatory Visit: Payer: Self-pay | Admitting: *Deleted

## 2015-06-03 ENCOUNTER — Encounter: Payer: Self-pay | Admitting: *Deleted

## 2015-06-03 NOTE — Patient Outreach (Signed)
S:  Pt is doing well. He was out in his shop working on a lawnmover when I arrived. He is doing well with his CHF self management. He is weighing every day and recording it. His wt variation has been 6 pounds 196-202. He denies being SOB when his wt was up. He does report one episode this week when he felt SOB and he couldn't do what he wanted to so he rested and felt better. He denies the naggy cough he had on my last visit, it has resolved. He reports improved sleep and only having to get up one time at night. He does say it is hard for him to get his urine stream going and to completely empty his bladder. He was due for a urology visit last August and never made it. He reports Dr. Martinique wants him to limit his PO fluids to 1.5L daily. He is also disappointed that Dr. Martinique is leaving the [facility].  O:  BP 160/78 mmHg  Pulse 63  Resp 16  Wt 200 lb (90.719 kg)  SpO2 96%       RRR       Lungs are clear       No edema  A:   CHF stable       Probable BPH  P:  Encouraged pt to make urology appt - he states Dr. Martinique will have to refer him and he wants to go to someone in [place] because he can get around better in that town. We looked up offices - perhaps  Dr. Laurence Ferrari at [facility]???  I will see pt again in one month.  THN CM Care Plan Problem One        Most Recent Value   Care Plan Problem One  CHF   Role Documenting the Problem One  Care Management Coordinator   Care Plan for Problem One  Active   THN CM Short Term Goal #1 (0-30 days)  Pt will not readmit for HF in the next 30 days.   THN CM Short Term Goal #1 Start Date  05/06/15   [facility] CM Short Term Goal #1 Met Date  06/03/15   Interventions for Short Term Goal #1  Reinforced that he is doing a great job with his self management and should continue.   THN CM Short Term Goal #2 (0-30 days)  See [name] next week for primary visit.   THN CM Short Term Goal #2 Start Date  05/06/15   [facility] CM Short Term  Goal #2 Met Date  06/03/15   Interventions for Short Term Goal #2  Pt saw his provider.   THN CM Short Term Goal #3 (0-30 days)  Pt to weigh daily and record in his [place].   THN CM Short Term Goal #3 Start Date  05/06/15   [facility] CM Short Term Goal #3 Met Date  06/03/15   Interventions for Short Tern Goal #3  Encouraged to continue this practice.    THN CM Care Plan Problem Two        Most Recent Value   Care Plan Problem Two  Difficulty emptying bladder.   Role Documenting the Problem Two  Care Management Coordinator   Care Plan for Problem Two  Active   THN CM Short Term Goal #1 (0-30 days)  Pt to schedule appt with new urologist by my next visit 06/30/15.   THN CM Short Term Goal #1 Start Date  06/03/15  Interventions for Short Term Goal #2   Pt needs to ask Dr. Martinique to refer him.

## 2015-06-14 DIAGNOSIS — J45909 Unspecified asthma, uncomplicated: Secondary | ICD-10-CM | POA: Diagnosis not present

## 2015-06-14 DIAGNOSIS — I509 Heart failure, unspecified: Secondary | ICD-10-CM | POA: Diagnosis not present

## 2015-06-14 DIAGNOSIS — K219 Gastro-esophageal reflux disease without esophagitis: Secondary | ICD-10-CM | POA: Diagnosis not present

## 2015-06-14 DIAGNOSIS — J988 Other specified respiratory disorders: Secondary | ICD-10-CM | POA: Diagnosis not present

## 2015-06-16 ENCOUNTER — Emergency Department (HOSPITAL_COMMUNITY): Payer: Commercial Managed Care - HMO

## 2015-06-16 ENCOUNTER — Observation Stay (HOSPITAL_COMMUNITY)
Admission: EM | Admit: 2015-06-16 | Discharge: 2015-06-19 | Disposition: A | Discharge status: Home / Self Care | Payer: Commercial Managed Care - HMO | Attending: Internal Medicine | Admitting: Internal Medicine

## 2015-06-16 ENCOUNTER — Encounter (HOSPITAL_COMMUNITY): Payer: Self-pay | Admitting: *Deleted

## 2015-06-16 DIAGNOSIS — I130 Hypertensive heart and chronic kidney disease with heart failure and stage 1 through stage 4 chronic kidney disease, or unspecified chronic kidney disease: Secondary | ICD-10-CM | POA: Diagnosis not present

## 2015-06-16 DIAGNOSIS — I10 Essential (primary) hypertension: Secondary | ICD-10-CM | POA: Diagnosis not present

## 2015-06-16 DIAGNOSIS — J9600 Acute respiratory failure, unspecified whether with hypoxia or hypercapnia: Secondary | ICD-10-CM | POA: Insufficient documentation

## 2015-06-16 DIAGNOSIS — Z95810 Presence of automatic (implantable) cardiac defibrillator: Secondary | ICD-10-CM | POA: Insufficient documentation

## 2015-06-16 DIAGNOSIS — E785 Hyperlipidemia, unspecified: Secondary | ICD-10-CM | POA: Diagnosis not present

## 2015-06-16 DIAGNOSIS — I5022 Chronic systolic (congestive) heart failure: Secondary | ICD-10-CM

## 2015-06-16 DIAGNOSIS — Z7901 Long term (current) use of anticoagulants: Secondary | ICD-10-CM

## 2015-06-16 DIAGNOSIS — N184 Chronic kidney disease, stage 4 (severe): Secondary | ICD-10-CM | POA: Insufficient documentation

## 2015-06-16 DIAGNOSIS — I4819 Other persistent atrial fibrillation: Secondary | ICD-10-CM | POA: Diagnosis present

## 2015-06-16 DIAGNOSIS — I110 Hypertensive heart disease with heart failure: Secondary | ICD-10-CM | POA: Diagnosis not present

## 2015-06-16 DIAGNOSIS — I420 Dilated cardiomyopathy: Secondary | ICD-10-CM | POA: Insufficient documentation

## 2015-06-16 DIAGNOSIS — J209 Acute bronchitis, unspecified: Secondary | ICD-10-CM | POA: Diagnosis not present

## 2015-06-16 DIAGNOSIS — Z955 Presence of coronary angioplasty implant and graft: Secondary | ICD-10-CM | POA: Insufficient documentation

## 2015-06-16 DIAGNOSIS — I480 Paroxysmal atrial fibrillation: Secondary | ICD-10-CM | POA: Insufficient documentation

## 2015-06-16 DIAGNOSIS — I5041 Acute combined systolic (congestive) and diastolic (congestive) heart failure: Secondary | ICD-10-CM | POA: Insufficient documentation

## 2015-06-16 DIAGNOSIS — I255 Ischemic cardiomyopathy: Secondary | ICD-10-CM | POA: Diagnosis not present

## 2015-06-16 DIAGNOSIS — I5043 Acute on chronic combined systolic (congestive) and diastolic (congestive) heart failure: Secondary | ICD-10-CM

## 2015-06-16 DIAGNOSIS — Z952 Presence of prosthetic heart valve: Secondary | ICD-10-CM | POA: Diagnosis not present

## 2015-06-16 DIAGNOSIS — E1122 Type 2 diabetes mellitus with diabetic chronic kidney disease: Secondary | ICD-10-CM | POA: Diagnosis not present

## 2015-06-16 DIAGNOSIS — R069 Unspecified abnormalities of breathing: Secondary | ICD-10-CM | POA: Diagnosis not present

## 2015-06-16 DIAGNOSIS — Z951 Presence of aortocoronary bypass graft: Secondary | ICD-10-CM | POA: Diagnosis not present

## 2015-06-16 DIAGNOSIS — I2510 Atherosclerotic heart disease of native coronary artery without angina pectoris: Secondary | ICD-10-CM | POA: Diagnosis not present

## 2015-06-16 DIAGNOSIS — R0789 Other chest pain: Secondary | ICD-10-CM

## 2015-06-16 DIAGNOSIS — R0602 Shortness of breath: Secondary | ICD-10-CM | POA: Diagnosis not present

## 2015-06-16 DIAGNOSIS — R05 Cough: Secondary | ICD-10-CM | POA: Diagnosis not present

## 2015-06-16 DIAGNOSIS — N183 Chronic kidney disease, stage 3 (moderate): Secondary | ICD-10-CM

## 2015-06-16 LAB — BRAIN NATRIURETIC PEPTIDE: B Natriuretic Peptide: 1510.4 pg/mL — ABNORMAL HIGH (ref 0.0–100.0)

## 2015-06-16 LAB — TROPONIN I
TROPONIN I: 0.09 ng/mL — AB (ref <0.031)
TROPONIN I: 0.09 ng/mL — AB (ref <0.031)
TROPONIN I: 0.07 ng/mL — AB (ref <0.031)

## 2015-06-16 LAB — CBC WITH DIFFERENTIAL/PLATELET
BASOS ABS: 0 10*3/uL (ref 0.0–0.1)
BASOS PCT: 0 %
Eosinophils Absolute: 0 10*3/uL (ref 0.0–0.7)
Eosinophils Relative: 0 %
HEMATOCRIT: 43.4 % (ref 39.0–52.0)
HEMOGLOBIN: 14.5 g/dL (ref 13.0–17.0)
Lymphocytes Relative: 10 %
Lymphs Abs: 1.1 10*3/uL (ref 0.7–4.0)
MCH: 28.4 pg (ref 26.0–34.0)
MCHC: 33.4 g/dL (ref 30.0–36.0)
MCV: 85.1 fL (ref 78.0–100.0)
MONO ABS: 2 10*3/uL — AB (ref 0.1–1.0)
Monocytes Relative: 17 %
NEUTROS ABS: 8.3 10*3/uL — AB (ref 1.7–7.7)
NEUTROS PCT: 73 %
Platelets: 252 10*3/uL (ref 150–400)
RBC: 5.1 MIL/uL (ref 4.22–5.81)
RDW: 14.6 % (ref 11.5–15.5)
WBC: 11.5 10*3/uL — ABNORMAL HIGH (ref 4.0–10.5)

## 2015-06-16 LAB — COMPREHENSIVE METABOLIC PANEL
ALBUMIN: 3.7 g/dL (ref 3.5–5.0)
ALT: 44 U/L (ref 17–63)
ANION GAP: 12 (ref 5–15)
AST: 41 U/L (ref 15–41)
Alkaline Phosphatase: 57 U/L (ref 38–126)
BILIRUBIN TOTAL: 0.6 mg/dL (ref 0.3–1.2)
BUN: 28 mg/dL — AB (ref 6–20)
CO2: 24 mmol/L (ref 22–32)
Calcium: 8.9 mg/dL (ref 8.9–10.3)
Chloride: 102 mmol/L (ref 101–111)
Creatinine, Ser: 1.76 mg/dL — ABNORMAL HIGH (ref 0.61–1.24)
GFR, EST AFRICAN AMERICAN: 40 mL/min — AB (ref >60)
GFR, EST NON AFRICAN AMERICAN: 35 mL/min — AB (ref >60)
Glucose, Bld: 124 mg/dL — ABNORMAL HIGH (ref 65–99)
POTASSIUM: 3.9 mmol/L (ref 3.5–5.1)
SODIUM: 138 mmol/L (ref 135–145)
TOTAL PROTEIN: 7.2 g/dL (ref 6.5–8.1)

## 2015-06-16 LAB — I-STAT ARTERIAL BLOOD GAS, ED
ACID-BASE EXCESS: 2 mmol/L (ref 0.0–2.0)
BICARBONATE: 24.7 meq/L — AB (ref 20.0–24.0)
O2 Saturation: 98 %
PO2 ART: 97 mmHg (ref 80.0–100.0)
TCO2: 26 mmol/L (ref 0–100)
pCO2 arterial: 32.2 mmHg — ABNORMAL LOW (ref 35.0–45.0)
pH, Arterial: 7.493 — ABNORMAL HIGH (ref 7.350–7.450)

## 2015-06-16 LAB — I-STAT CG4 LACTIC ACID, ED: Lactic Acid, Venous: 1.91 mmol/L (ref 0.5–2.0)

## 2015-06-16 LAB — INFLUENZA PANEL BY PCR (TYPE A & B)
H1N1FLUPCR: NOT DETECTED
INFLBPCR: NEGATIVE
Influenza A By PCR: NEGATIVE

## 2015-06-16 LAB — MRSA PCR SCREENING: MRSA by PCR: NEGATIVE

## 2015-06-16 MED ORDER — ISOSORBIDE MONONITRATE ER 30 MG PO TB24
30.0000 mg | ORAL_TABLET | Freq: Every day | ORAL | Status: DC
  Administered 2015-06-17 – 2015-06-19 (×3): 30 mg via ORAL
  Filled 2015-06-16 (×3): qty 1

## 2015-06-16 MED ORDER — ACETAMINOPHEN 325 MG PO TABS: 650.0000 mg | ORAL_TABLET | ORAL | Status: DC | PRN

## 2015-06-16 MED ORDER — APIXABAN 2.5 MG PO TABS
2.5000 mg | ORAL_TABLET | Freq: Two times a day (BID) | ORAL | Status: DC
  Filled 2015-06-16: qty 1

## 2015-06-16 MED ORDER — SODIUM CHLORIDE 0.9% FLUSH
3.0000 mL | INTRAVENOUS | Status: DC | PRN
Start: 2015-06-16 — End: 2015-06-19

## 2015-06-16 MED ORDER — OMEGA-3-ACID ETHYL ESTERS 1 G PO CAPS
2.0000 g | ORAL_CAPSULE | Freq: Two times a day (BID) | ORAL | Status: DC
Start: 2015-06-16 — End: 2015-06-19
  Administered 2015-06-16 – 2015-06-19 (×6): 2 g via ORAL
  Filled 2015-06-16 (×6): qty 2

## 2015-06-16 MED ORDER — DOXYCYCLINE HYCLATE 100 MG PO TABS
100.0000 mg | ORAL_TABLET | Freq: Two times a day (BID) | ORAL | Status: DC
  Administered 2015-06-16 – 2015-06-19 (×7): 100 mg via ORAL
  Filled 2015-06-16 (×6): qty 1

## 2015-06-16 MED ORDER — ALBUTEROL SULFATE (2.5 MG/3ML) 0.083% IN NEBU: 2.5000 mg | INHALATION_SOLUTION | RESPIRATORY_TRACT | Status: DC | PRN

## 2015-06-16 MED ORDER — FUROSEMIDE 10 MG/ML IJ SOLN
40.0000 mg | Freq: Two times a day (BID) | INTRAMUSCULAR | Status: DC
  Administered 2015-06-16 – 2015-06-17 (×3): 40 mg via INTRAVENOUS
  Filled 2015-06-16 (×4): qty 4

## 2015-06-16 MED ORDER — MAGNESIUM 250 MG PO TABS: 1.0000 | ORAL_TABLET | Freq: Every evening | ORAL | Status: DC

## 2015-06-16 MED ORDER — OMEGA-3-ACID ETHYL ESTERS 1 G PO CAPS: 2.0000 g | ORAL_CAPSULE | Freq: Two times a day (BID) | ORAL | Status: DC

## 2015-06-16 MED ORDER — ALBUTEROL SULFATE (2.5 MG/3ML) 0.083% IN NEBU
INHALATION_SOLUTION | RESPIRATORY_TRACT | Status: AC
  Filled 2015-06-16: qty 3

## 2015-06-16 MED ORDER — ATORVASTATIN CALCIUM 20 MG PO TABS
20.0000 mg | ORAL_TABLET | Freq: Every day | ORAL | Status: DC
  Administered 2015-06-16 – 2015-06-18 (×3): 20 mg via ORAL
  Filled 2015-06-16: qty 2
  Filled 2015-06-16 (×2): qty 1

## 2015-06-16 MED ORDER — CARVEDILOL 6.25 MG PO TABS
6.2500 mg | ORAL_TABLET | Freq: Two times a day (BID) | ORAL | Status: DC
  Administered 2015-06-16 – 2015-06-19 (×6): 6.25 mg via ORAL
  Filled 2015-06-16 (×6): qty 1

## 2015-06-16 MED ORDER — ALBUTEROL SULFATE (2.5 MG/3ML) 0.083% IN NEBU
5.0000 mg | INHALATION_SOLUTION | Freq: Once | RESPIRATORY_TRACT | Status: AC
  Administered 2015-06-16: 5 mg via RESPIRATORY_TRACT
  Filled 2015-06-16: qty 6

## 2015-06-16 MED ORDER — FAMOTIDINE 20 MG PO TABS
10.0000 mg | ORAL_TABLET | Freq: Every day | ORAL | Status: DC
  Administered 2015-06-16 – 2015-06-19 (×4): 10 mg via ORAL
  Filled 2015-06-16 (×4): qty 1

## 2015-06-16 MED ORDER — AMIODARONE HCL 200 MG PO TABS
200.0000 mg | ORAL_TABLET | Freq: Every day | ORAL | Status: DC
  Administered 2015-06-17 – 2015-06-19 (×3): 200 mg via ORAL
  Filled 2015-06-16 (×4): qty 1

## 2015-06-16 MED ORDER — HYDRALAZINE HCL 25 MG PO TABS
12.5000 mg | ORAL_TABLET | Freq: Two times a day (BID) | ORAL | Status: DC
  Administered 2015-06-16 – 2015-06-19 (×6): 12.5 mg via ORAL
  Filled 2015-06-16 (×6): qty 1

## 2015-06-16 MED ORDER — ADULT MULTIVITAMIN W/MINERALS CH
1.0000 | ORAL_TABLET | Freq: Every day | ORAL | Status: DC
  Administered 2015-06-17 – 2015-06-19 (×3): 1 via ORAL
  Filled 2015-06-16 (×3): qty 1

## 2015-06-16 MED ORDER — SODIUM CHLORIDE 0.9 % IV SOLN: 250.0000 mL | INTRAVENOUS | Status: DC | PRN

## 2015-06-16 MED ORDER — ONDANSETRON HCL 4 MG/2ML IJ SOLN: 4.0000 mg | Freq: Four times a day (QID) | INTRAMUSCULAR | Status: DC | PRN

## 2015-06-16 MED ORDER — MULTIVITAMINS PO CAPS: 1.0000 | ORAL_CAPSULE | Freq: Every day | ORAL | Status: DC

## 2015-06-16 MED ORDER — ACETAMINOPHEN 500 MG PO TABS: 500.0000 mg | ORAL_TABLET | Freq: Four times a day (QID) | ORAL | Status: DC | PRN

## 2015-06-16 MED ORDER — SODIUM CHLORIDE 0.9% FLUSH
3.0000 mL | Freq: Two times a day (BID) | INTRAVENOUS | Status: DC
  Administered 2015-06-16 – 2015-06-19 (×7): 3 mL via INTRAVENOUS

## 2015-06-16 MED ORDER — MAGNESIUM OXIDE 400 (241.3 MG) MG PO TABS
200.0000 mg | ORAL_TABLET | Freq: Every day | ORAL | Status: DC
  Administered 2015-06-16 – 2015-06-18 (×3): 200 mg via ORAL
  Filled 2015-06-16 (×3): qty 1

## 2015-06-16 MED ORDER — APIXABAN 2.5 MG PO TABS
2.5000 mg | ORAL_TABLET | Freq: Two times a day (BID) | ORAL | Status: DC
  Administered 2015-06-16 – 2015-06-19 (×6): 2.5 mg via ORAL
  Filled 2015-06-16 (×6): qty 1

## 2015-06-16 NOTE — Consult Note (Signed)
CARDIOLOGY CONSULT NOTE   Patient ID: ARTUR WINNINGHAM MRN: [id number] DOB/AGE: 80-May-1934 80 y.o.  Admit date: 06/16/2015  Primary Physician   Martinique, BETTY G, MD Primary Cardiologist   Dr. Radford Pax Reason for Consultation   CHF Requesting Physician  Dr. Denny Levy  HPI: [name] is a 82 y.o. male with a history of hypertension, aortic stenosis status post AVR, chronic combined systolic and diastolic CHF in the setting of A. fib with RVR s/p BiV pacing, orthostatic hypotension, dilated cardiomyopathy, CAD status post CABG, and paroxysmal atrial fibrillation who presented with worsening SOB for the past 5 days.   Admitted 04/30/15-05/02/15 for with acute CHF. Seen by Dr. Radford Pax and felt that is most likely due to new placement of AICD. Seen by EP and parameter adjusted.   He was doing well initially however started to having shortness of breath for the past few days. He also has been having intermittent cough for the past several days. He was seen by primary care provider Tuesday 3/7 and given inhaler and prednisone for cough. For the past 2 days his shortness of breath is more severe that was worsened with laying down. Also admits to having orthopnea and PND. The patient denies dizziness, palpitations, chest pain, lower extremity edema, syncope, nausea, vomiting, blood in his stools or urine.  This morning he woke up with his severe shortness of breath that lead him to ED presentation. BNP 1510. Creatinine of 1.76. Chest x-ray without acute cardiopulmonary disease. Mild hyperinflation. Calcification of aorta. Troponin I of 0.09. Lactic acid 1.9. EKG shows a sensed V paced rhythm with PVC.  Echocardiogram 01/2015 shows left ventricular function of 25-30%. The patient had a biventricular ICD placement 04/22/15. Echocardiogram 05/01/15 shows left ventricular function of 11-02%, grade 2 diastolic dysfunction, mild mitral regurgitation moderately dilated systolic function of right ventricle, mildly  dilated right atrium.  The patient was started on IV Lasix 40 mg twice a day given albuterol nebulizer. Diuresed 150 ML.  Past Medical History  Diagnosis Date  . Heart murmur   . Hypercholesteremia   . HTN (hypertension)   . Diastolic CHF ([place])     2D echo 12/2010 - nl LVF, mild LAE, MAC with moderately thickened MV leaflets and trivial MR, normal functioning prosthetic AVR with mild increased gradient, grade II diastolic dysfunction   . Hypotension   . Orthostatic hypotension   . Aortic valve disorder 2009    severe AS s/p AVR with pericardial tissue valve  . Aortic stenosis   . Barrett esophagus   . CAD (coronary artery disease)     s/p 2 vessel CABG with LIMA to LAD, SVG to diagonal 1, SVG to RCA 12/09  . Cardiomyopathy ([place])   . H/O: rheumatic fever   . Atrial fibrillation ([place])     post op afib with no reoccurence  . Cardiomyopathy ([place])     a. Echo 5/16:  mild LVH, EF 30-35%, diff HK, AVR ok, mild MR, severe LAE, mod RAE, reduced RVSF.  Repeat limited echo 10/2014 with EF 25-30%  . History of PFTs     a. Amiodarone started 5/16 >> PFTs w/ DLCO 6/16:  FEV1 72% predicted, FEV1/FVC 66%, DLCO 66% >> minimal reversible obstructive airways disease with mild diffusion defect (suggestive of emphysema but absence of hyperinflation inconsistent with dx)  . AICD (automatic cardioverter/defibrillator) present   . Asthma     "a touch"  . Diabetes ([place])     "I'm prediabetic" (04/22/2015)  . GERD (  gastroesophageal reflux disease)   . History of hiatal hernia   . Hepatitis 1957    "don't know what kind:  . Arthritis     "minor everywhere" (04/22/2015)  . CKD (chronic kidney disease)   . Pneumonia 08/2014    "dr thought I may have had a touch"     Past Surgical History  Procedure Laterality Date  . Coronary artery bypass graft  03/10/2008    x 3 Dr. Roxan Hockey  . Aortic valve replacement  with 23-mm Magna Ease pericardial valve, model number    with 23-mm Magna Ease pericardial valve,  model [id number], serial number [id number]   . Cataract extraction w/ intraocular lens  implant, bilateral Bilateral   . Cardioversion N/A 01/27/2013    Procedure: CARDIOVERSION;  Surgeon: Sueanne Margarita, MD;  Location: [facility];  Service: Cardiovascular;  Laterality: N/A;  . Cardioversion N/A 08/25/2014    Procedure: CARDIOVERSION;  Surgeon: Sanda Klein, MD;  Location: [facility];  Service: Cardiovascular;  Laterality: N/A;  . Cardiac catheterization N/A 11/04/2014    Procedure: Left Heart Cath and Cors/Grafts Angiography;  Surgeon: Leonie Man, MD;  Location: [place] [facility];  Service: Cardiovascular;  Laterality: N/A;  . Bi-ventricular implantable cardioverter defibrillator  (crt-d)  04/22/2015  . Tonsillectomy    . Appendectomy  1940s  . Cardiac valve replacement    . Esophagogastroduodenoscopy (egd) with esophageal dilation  X1  . Ep implantable device N/A 04/22/2015    Procedure: BiV ICD Insertion CRT-D;  Surgeon: [name], MD;  Location: [place] [facility];  Service: Cardiovascular;  Laterality: N/A;    Allergies  Allergen Reactions  . Novocain [Procaine] Swelling    I have reviewed the patient's current medications . [START ON 06/17/2015] amiodarone  200 mg Oral Daily  . apixaban  2.5 mg Oral BID  . atorvastatin  20 mg Oral q1800  . carvedilol  6.25 mg Oral BID WC  . doxycycline  100 mg Oral BID  . famotidine  10 mg Oral Daily  . furosemide  40 mg Intravenous BID  . hydrALAZINE  12.5 mg Oral BID AC  . [START ON 06/17/2015] isosorbide mononitrate  30 mg Oral Daily  . magnesium oxide  200 mg Oral QHS  . [START ON 06/17/2015] multivitamin with minerals  1 tablet Oral Daily  . omega-3 acid ethyl esters  2 g Oral BID  . sodium chloride flush  3 mL Intravenous Q12H   . sodium chloride     sodium chloride, acetaminophen, acetaminophen, albuterol, ondansetron (ZOFRAN) IV, sodium chloride flush  Prior to Admission medications   Medication Sig Start Date  End Date Taking? Authorizing Provider  acetaminophen (TYLENOL) 500 MG tablet Take 500 mg by mouth every 6 (six) hours as needed for moderate pain.   Yes Historical Provider, MD  amiodarone (PACERONE) 200 MG tablet Take 1 tablet (200 mg total) by mouth daily. 09/03/14  Yes Liliane Shi, PA-C  apixaban (ELIQUIS) 2.5 MG TABS tablet Take 1 tablet (2.5 mg total) by mouth 2 (two) times daily. 01/19/15  Yes Sueanne Margarita, MD  atorvastatin (LIPITOR) 20 MG tablet Take 20 mg by mouth daily at 6 PM.    Yes Historical Provider, MD  carvedilol (COREG) 6.25 MG tablet Take 1 tablet (6.25 mg total) by mouth 2 (two) times daily with a meal. 02/16/15  Yes Sueanne Margarita, MD  doxycycline (VIBRA-TABS) 100 MG tablet Take 100 mg by mouth 2 (two) times daily.   Yes  Historical Provider, MD  furosemide (LASIX) 20 MG tablet Take 1 tablet (20 mg total) by mouth daily. 05/02/15  Yes Velvet Bathe, MD  hydrALAZINE (APRESOLINE) 25 MG tablet Take 0.5 tablets (12.5 mg total) by mouth 2 (two) times daily before a meal. 02/16/15  Yes Sueanne Margarita, MD  isosorbide mononitrate (IMDUR) 30 MG 24 hr tablet Take 1 tablet (30 mg total) by mouth daily. 01/19/15  Yes Sueanne Margarita, MD  lisinopril (PRINIVIL,ZESTRIL) 10 MG tablet Take 20 mg by mouth every evening.  02/16/15  Yes Sueanne Margarita, MD  Magnesium 250 MG TABS Take 1 tablet by mouth every evening.    Yes Historical Provider, MD  Multiple Vitamin (MULTIVITAMIN) capsule Take 1 capsule by mouth daily.   Yes Historical Provider, MD  omega-3 acid ethyl esters (LOVAZA) 1 G capsule Take 2 g by mouth 2 (two) times daily.   Yes Historical Provider, MD  ranitidine (ZANTAC) 300 MG tablet Take 300 mg by mouth at bedtime.   Yes Historical Provider, MD     Social History   Social History  . Marital Status: Married    Spouse Name: N/A  . Number of Children: N/A  . Years of Education: N/A   Occupational History  . Not on file.   Social History Main Topics  . Smoking status: Never Smoker     . Smokeless tobacco: Never Used  . Alcohol Use: Yes     Comment: 04/22/2015 "nothing since the 1980s; never had a problem w/it"  . Drug Use: No  . Sexual Activity: No   Other Topics Concern  . Not on file   Social History Narrative    Family Status  Relation Status Death Age  . Mother Deceased   . Father Deceased   . Daughter Deceased    Family History  Problem Relation Age of Onset  . Alzheimer's disease    . Alzheimer's disease Mother   . Cancer Sister   . COPD Sister   . Depression Sister   . Heart disease Sister   . Hyperlipidemia Sister   . Hypertension Sister   . COPD Daughter       ROS:  Full 14 point review of systems complete and found to be negative unless listed above.  Physical Exam: Blood pressure 111/97, pulse 77, temperature 97.8 F (36.6 C), temperature source Oral, resp. rate 22, height _0  (1.88 m), weight 198 lb (89.812 kg), SpO2 93 %.  General: Frail elderly male in no acute distress Head: Eyes PERRLA, No xanthomas. Normocephalic and atraumatic, oropharynx without edema or exudate.  Lungs: Resp regular and unlabored. Diminished bibasilar.  Heart: RRR no s3, s4, or murmurs..   Neck: No carotid bruits. No lymphadenopathy.  No JVD. Abdomen: Bowel sounds present, abdomen soft and non-tender without masses or hernias noted. Msk:  No spine or cva tenderness. No weakness, no joint deformities or effusions. Extremities: No clubbing, cyanosis or edema. DP/PT/Radials 2+ and equal bilaterally. Neuro: Alert and oriented X 3. No focal deficits noted. Psych:  Good affect, responds appropriately Skin: No rashes or lesions noted.  Labs:   Lab Results  Component Value Date   WBC 11.5* 06/16/2015   HGB 14.5 06/16/2015   HCT 43.4 06/16/2015   MCV 85.1 06/16/2015   PLT 252 06/16/2015   No results for input(s): INR in the last 72 hours.  Recent Labs Lab 06/16/15 0632  NA 138  K 3.9  CL 102  CO2 24  BUN 28*  CREATININE 1.76*  CALCIUM 8.9  PROT 7.2   BILITOT 0.6  ALKPHOS 57  ALT 44  AST 41  GLUCOSE 124*  ALBUMIN 3.7   MAGNESIUM  Date Value Ref Range Status  08/23/2014 2.2 1.7 - 2.4 mg/dL Final    Recent Labs  06/16/15 0632  TROPONINI 0.09*   No results for input(s): TROPIPOC in the last 72 hours. No results found for: PROBNP Lab Results  Component Value Date   CHOL 123* 01/19/2015   HDL 45 01/19/2015   LDLCALC 46 01/19/2015   TRIG 161* 01/19/2015     Echo: 05/01/15 LV EF: 15% - 20%  ------------------------------------------------------------------- Indications: CHF - 428.0.  ------------------------------------------------------------------- History: PMH: Hypotension. Atrial fibrillation. Coronary artery disease. Aortic valve disease. PMH: Rheumatic Fever. Risk factors: Cardiomyopathy. Diabetes mellitus.  ------------------------------------------------------------------- Study Conclusions  - Left ventricle: The cavity size was moderately dilated. Wall  thickness was normal. Systolic function was severely reduced. The  estimated ejection fraction was in the range of 15% to 20%.  Severe diffuse hypokinesis with no identifiable regional  variations. Features are consistent with a pseudonormal left  ventricular filling pattern, with concomitant abnormal relaxation  and increased filling pressure (grade 2 diastolic dysfunction).  Acoustic contrast opacification revealed no evidence ofthrombus. - Ventricular septum: Septal motion showed paradox. These changes  are consistent with right ventricular pacing. - Aortic valve: A bioprosthesis was present and functioning  normally. - Mitral valve: Calcified annulus. Mildly thickened leaflets .  There was mild regurgitation directed centrally. - Left atrium: The atrium was moderately to severely dilated. - Right ventricle: The cavity size was severely dilated. Systolic  function was moderately reduced. - Right atrium: The atrium was  mildly dilated. - Atrial septum: No defect or patent foramen ovale was identified.  ECG:   Vent. rate 76 BPM PR interval 167 ms QRS duration 136 ms QT/QTc 460/517 ms P-R-T axes 11 -87 99  Radiology:  Dg Chest 2 View  06/16/2015  CLINICAL DATA:  Increasing shortness of breath this for now.  Cough. EXAM: CHEST  2 VIEW COMPARISON:  04/30/2015 FINDINGS: Postoperative changes in the mediastinum. Cardiac pacemaker. Normal heart size and pulmonary vascularity. Mild hyperinflation. No focal airspace disease or consolidation in the lungs. No blunting of costophrenic angles. No pneumothorax. Calcification of the aorta. Degenerative changes in the spine. IMPRESSION: No active cardiopulmonary disease. Electronically Signed   By: Lucienne Capers M.D.   On: 06/16/2015 06:59    ASSESSMENT AND PLAN:     1. Worsening dyspnea due to acute on chronic combined systolic and diastolic hours in a setting of cardiomyopathy - Dyspnea for the past 5 days. Worsened in the last 2 days with orthopnea and PND - BNP of 1510 toady which  improved from last admission 3478 04/30/15. - last Echocardiogram 05/01/15 shows left ventricular function of 89-21%, grade 2 diastolic dysfunction, mild mitral regurgitation moderately dilated systolic function of right ventricle, mildly dilated right atrium. - Echocardiogram 01/2015 shows left ventricular function of 25-30%. The patient had a biventricular ICD placement 04/22/15.  - Patient does not look volume overloaded exam. Repeat echocardiogram?. Continue IV diuresis with Lasix 40 mg twice a day.  - He is compliant with his meds and diet.  - Continue beta blocker/hydralazine/Imdur/statin. Held ACE  in setting of chronic kidney disease. - Device check 05/05/15 showed bi-ventricularly  pacing 97.3%, VSRP 2.4%. Device programmed at 3.5V for extra safety. No mode switches or ventricular arrhythmias noted. - CHF team will follow up.   2. Elevated  troponin - Troponin of 0.09-->0.07. Seems  chronically elevated. Continue cycle troponin. No anginal pain.  3. ASCAD s/p CABG with no angina 4. AS s/p pericardial tissue AVR 5. PAF maintaining NSR on Amio/BB. Continue Eliquis (adjusted for renal insuff) 6. Chronic kidney disease: Creatinine as baseline.    SignedLeanor Kail, [place] 06/16/2015, 2:23 PM Pager [phone]  Co-Sign MD

## 2015-06-16 NOTE — H&P (Signed)
Patient Demographics  [name], is a 82 y.o. male  MRN: [id number]   DOB - 1932-11-03  Admit Date - 06/16/2015  Outpatient Primary MD for the patient is [place], Malka So, MD   With History of -  Past Medical History  Diagnosis Date  . Heart murmur   . Hypercholesteremia   . HTN (hypertension)   . Diastolic CHF ([place])     2D echo 12/2010 - nl LVF, mild LAE, MAC with moderately thickened MV leaflets and trivial MR, normal functioning prosthetic AVR with mild increased gradient, grade II diastolic dysfunction   . Hypotension   . Orthostatic hypotension   . Aortic valve disorder 2009    severe AS s/p AVR with pericardial tissue valve  . Aortic stenosis   . Barrett esophagus   . CAD (coronary artery disease)     s/p 2 vessel CABG with LIMA to LAD, SVG to diagonal 1, SVG to RCA 12/09  . Cardiomyopathy ([place])   . H/O: rheumatic fever   . Atrial fibrillation ([place])     post op afib with no reoccurence  . Cardiomyopathy ([place])     a. Echo 5/16:  mild LVH, EF 30-35%, diff HK, AVR ok, mild MR, severe LAE, mod RAE, reduced RVSF.  Repeat limited echo 10/2014 with EF 25-30%  . History of PFTs     a. Amiodarone started 5/16 >> PFTs w/ DLCO 6/16:  FEV1 72% predicted, FEV1/FVC 66%, DLCO 66% >> minimal reversible obstructive airways disease with mild diffusion defect (suggestive of emphysema but absence of hyperinflation inconsistent with dx)  . AICD (automatic cardioverter/defibrillator) present   . Asthma     "a touch"  . Diabetes ([place])     "I'm prediabetic" (04/22/2015)  . GERD (gastroesophageal reflux disease)   . History of hiatal hernia   . Hepatitis 1957    "don't know what kind:  . Arthritis     "minor everywhere" (04/22/2015)  . CKD (chronic kidney disease)   . Pneumonia 08/2014    "dr thought I may have had a touch"      Past Surgical History  Procedure Laterality Date  . Coronary artery bypass graft  03/10/2008    x 3 Dr. Roxan Hockey  . Aortic valve replacement  with  23-mm Magna Ease pericardial valve, model number    with 23-mm Magna Ease pericardial valve, model [id number], serial number [id number]   . Cataract extraction w/ intraocular lens  implant, bilateral Bilateral   . Cardioversion N/A 01/27/2013    Procedure: CARDIOVERSION;  Surgeon: Sueanne Margarita, MD;  Location: [facility];  Service: Cardiovascular;  Laterality: N/A;  . Cardioversion N/A 08/25/2014    Procedure: CARDIOVERSION;  Surgeon: Sanda Klein, MD;  Location: [facility];  Service: Cardiovascular;  Laterality: N/A;  . Cardiac catheterization N/A 11/04/2014    Procedure: Left Heart Cath and Cors/Grafts Angiography;  Surgeon: Leonie Man, MD;  Location: [facility];  Service: Cardiovascular;  Laterality: N/A;  . Bi-ventricular implantable cardioverter defibrillator  (crt-d)  04/22/2015  . Tonsillectomy    . Appendectomy  1940s  . Cardiac valve replacement    . Esophagogastroduodenoscopy ([facility]) with esophageal dilation  X1  . Ep implantable device N/A 04/22/2015    Procedure: BiV ICD Insertion CRT-D;  Surgeon: [name], MD;  Location: [facility];  Service: Cardiovascular;  Laterality: N/A;    in for   Chief Complaint  Patient presents with  . Shortness of Breath  HPI  [name]  is a 82 y.o. male, with a h/o hypertension, aortic stenosis status post AVR, chronic combined systolic and diastolic CHF in the setting of A. fib with RVR, orthostatic hypotension, dilated cardiomyopathy EF 15-20 %, CAD status post CABG, and paroxysmal atrial fibrillation, CKD, patient presents with complaints of progressive dyspnea, reports cough of dyspnea, I saw his PCP, who increase his Lasix, did put him on prednisone taper, no improvement of symptoms, came today to ED, reports dyspnea mainly upon laying supine, has been sleeping sitting up of last 2 days, denies any lower extremity edema, but he has been compliant with medication, in ED workup significant for elevated  BNP(but its lower than his baseline), mildly elevated troponins(chronically elevated), chest x-ray with no evidence of pulmonary edema or volume overload, patient remains dyspneic, tachypneic, as well patient had few runs of NSVT in ED ,I was called to admit.    Review of Systems    In addition to the HPI above,  No Fever-chills, No Headache, No changes with Vision or hearing, No problems swallowing food or Liquids, No Chest pain, complains of Cough or Shortness of Breath, No Abdominal pain, No Nausea or Vommitting, Bowel movements are regular, No Blood in stool or Urine, No dysuria, No new skin rashes or bruises, No new joints pains-aches,  No new weakness, tingling, numbness in any extremity, No recent weight gain or loss, No polyuria, polydypsia or polyphagia, No significant Mental Stressors.  A full 10 point Review of Systems was done, except as stated above, all other Review of Systems were negative.   Social History Social History  Substance Use Topics  . Smoking status: Never Smoker   . Smokeless tobacco: Never Used  . Alcohol Use: Yes     Comment: 04/22/2015 "nothing since the 1980s; never had a problem w/it"     Family History Family History  Problem Relation Age of Onset  . Alzheimer's disease    . Alzheimer's disease Mother   . Cancer Sister   . COPD Sister   . Depression Sister   . Heart disease Sister   . Hyperlipidemia Sister   . Hypertension Sister   . COPD Daughter      Prior to Admission medications   Medication Sig Start Date End Date Taking? Authorizing Provider  acetaminophen (TYLENOL) 500 MG tablet Take 500 mg by mouth every 6 (six) hours as needed for moderate pain.   Yes Historical Provider, MD  amiodarone (PACERONE) 200 MG tablet Take 1 tablet (200 mg total) by mouth daily. 09/03/14  Yes Liliane Shi, PA-C  apixaban (ELIQUIS) 2.5 MG TABS tablet Take 1 tablet (2.5 mg total) by mouth 2 (two) times daily. 01/19/15  Yes Sueanne Margarita, MD    atorvastatin (LIPITOR) 20 MG tablet Take 20 mg by mouth daily at 6 PM.    Yes Historical Provider, MD  carvedilol (COREG) 6.25 MG tablet Take 1 tablet (6.25 mg total) by mouth 2 (two) times daily with a meal. 02/16/15  Yes Sueanne Margarita, MD  doxycycline (VIBRA-TABS) 100 MG tablet Take 100 mg by mouth 2 (two) times daily.   Yes Historical Provider, MD  furosemide (LASIX) 20 MG tablet Take 1 tablet (20 mg total) by mouth daily. 05/02/15  Yes Velvet Bathe, MD  hydrALAZINE (APRESOLINE) 25 MG tablet Take 0.5 tablets (12.5 mg total) by mouth 2 (two) times daily before a meal. 02/16/15  Yes Sueanne Margarita, MD  isosorbide mononitrate (IMDUR) 30 MG 24 hr  tablet Take 1 tablet (30 mg total) by mouth daily. 01/19/15  Yes Sueanne Margarita, MD  lisinopril (PRINIVIL,ZESTRIL) 10 MG tablet Take 20 mg by mouth every evening.  02/16/15  Yes Sueanne Margarita, MD  Magnesium 250 MG TABS Take 1 tablet by mouth every evening.    Yes Historical Provider, MD  Multiple Vitamin (MULTIVITAMIN) capsule Take 1 capsule by mouth daily.   Yes Historical Provider, MD  omega-3 acid ethyl esters (LOVAZA) 1 G capsule Take 2 g by mouth 2 (two) times daily.   Yes Historical Provider, MD  ranitidine (ZANTAC) 300 MG tablet Take 300 mg by mouth at bedtime.   Yes Historical Provider, MD    Allergies  Allergen Reactions  . Novocain [Procaine] Swelling    Physical Exam  Vitals  Blood pressure 124/57, pulse 73, temperature 97.8 F (36.6 C), temperature source Oral, resp. rate 22, height _0  (1.88 m), weight 89.812 kg (198 lb), SpO2 97 %.   1. General frail elderly male  lying in bed in NAD,    2. Normal affect and insight, Not Suicidal or Homicidal, Awake Alert, Oriented X 3.  3. No F.N deficits, ALL C.Nerves Intact, Strength 5/5 all 4 extremities, Sensation intact all 4 extremities, Plantars down going.  4. Ears and Eyes appear Normal, Conjunctivae clear, PERRLA. Moist Oral Mucosa.  5. Supple Neck,  No Carotid Bruits.  6.  Symmetrical Chest wall movement, Good air movement bilaterally, no wheezing , Tachypneic, mild oozing of accessory muscle.  7. RRR, No Gallops, Rubs or Murmurs, No Parasternal Heave.  8. Positive Bowel Sounds, Abdomen Soft, No tenderness, No organomegaly appriciated,No rebound -guarding or rigidity.  9.  No Cyanosis, Normal Skin Turgor, No Skin Rash or Bruise.  10. Good muscle tone,  joints appear normal , no effusions, Normal ROM.      Data Review  CBC  Recent Labs Lab 06/16/15 0632  WBC 11.5*  HGB 14.5  HCT 43.4  PLT 252  MCV 85.1  MCH 28.4  MCHC 33.4  RDW 14.6  LYMPHSABS 1.1  MONOABS 2.0*  EOSABS 0.0  BASOSABS 0.0   ------------------------------------------------------------------------------------------------------------------  Chemistries   Recent Labs Lab 06/16/15 0632  NA 138  K 3.9  CL 102  CO2 24  GLUCOSE 124*  BUN 28*  CREATININE 1.76*  CALCIUM 8.9  AST 41  ALT 44  ALKPHOS 57  BILITOT 0.6   ------------------------------------------------------------------------------------------------------------------ estimated creatinine clearance is 38.9 mL/min (by C-G formula based on Cr of 1.76). ------------------------------------------------------------------------------------------------------------------ No results for input(s): TSH, T4TOTAL, T3FREE, THYROIDAB in the last 72 hours.  Invalid input(s): FREET3   Coagulation profile No results for input(s): INR, PROTIME in the last 168 hours. ------------------------------------------------------------------------------------------------------------------- No results for input(s): DDIMER in the last 72 hours. -------------------------------------------------------------------------------------------------------------------  Cardiac Enzymes  Recent Labs Lab 06/16/15 0632  TROPONINI 0.09*    ------------------------------------------------------------------------------------------------------------------ Invalid input(s): POCBNP   ---------------------------------------------------------------------------------------------------------------  Urinalysis    Component Value Date/Time   COLORURINE YELLOW 04/30/2015 [place] 04/30/2015 1653   LABSPEC 1.008 04/30/2015 1653   PHURINE 6.0 04/30/2015 1653   GLUCOSEU NEGATIVE 04/30/2015 1653   HGBUR NEGATIVE 04/30/2015 1653   BILIRUBINUR NEGATIVE 04/30/2015 1653   KETONESUR NEGATIVE 04/30/2015 1653   PROTEINUR NEGATIVE 04/30/2015 1653   UROBILINOGEN 0.2 03/08/2008 1519   NITRITE NEGATIVE 04/30/2015 1653   LEUKOCYTESUR NEGATIVE 04/30/2015 1653    ----------------------------------------------------------------------------------------------------------------  Imaging results:   Dg Chest 2 View  06/16/2015  CLINICAL DATA:  Increasing shortness of breath this for  now.  Cough. EXAM: CHEST  2 VIEW COMPARISON:  04/30/2015 FINDINGS: Postoperative changes in the mediastinum. Cardiac pacemaker. Normal heart size and pulmonary vascularity. Mild hyperinflation. No focal airspace disease or consolidation in the lungs. No blunting of costophrenic angles. No pneumothorax. Calcification of the aorta. Degenerative changes in the spine. IMPRESSION: No active cardiopulmonary disease. Electronically Signed   By: Lucienne Capers M.D.   On: 06/16/2015 06:59       Assessment & Plan  Active Problems:   Essential hypertension   CAD status post CABG   Atrial fibrillation (HCC)   Chronic anticoagulation   CKD (chronic kidney disease) stage 3, GFR 30-59 ml/min   Cardiomyopathy, ischemic: EF roughly 30%   Congestive dilated cardiomyopathy (H[facility])   CHF (congestive heart failure) ([place])    Dyspnea secondary to acute combined systolic and diastolic CHF in the setting of cardiomyopathy most recent EF 15%. - Continue his oxygen as  needed for oxygen saturation more than 95% - Keep strict ins and outs, daily weight, continue with IV Lasix 40 mg IV twice a day. - Continue with beta blockers, hold ACEi giving his chronic kidney disease, and the setting of aggressive diuresis. - EF 15%, status post AICD - Unclear of infectious component attributing to his dyspnea, will check influenza panel, will continue with by mouth doxycycline, continue with nebs.  CAD with history of CABG - With mildly elevated troponins, but chronically elevated, most likely in the setting of CHF, we'll cycle troponins. - Not on aspirin, most likely because he is on Eliquis, defer management to cardiology  Hypertension - Continue with home medication and titrate at as needed  Paroxysmal atrial fibrillation -  continue with beta blocker, continue with amiodarone, continue with Eliquis   Chronic kidney disease - At baseline, continue to monitor as on IV diuresis, hold ACEi.  Hyperlipidemia  - Continue statin   DVT Prophylaxis Eliquis   AM Labs Ordered, also please review Full Orders  Family Communication: Admission, patients condition and plan of care including tests being ordered have been discussed with the patient  who indicate understanding and agree with the plan and Code Status.  Code Status full code   Likely DC to  home  Condition GUARDED    Time spent in minutes : 60 minutes    ELGERGAWY, DAWOOD M.D on 06/16/2015 at 8:20 AM  Between 7am to 7pm - Pager - [phone]  After 7pm go to [email or web address] - password [email or web address]  And look for the night coverage person covering me after hours  [facility] Office  [phone]

## 2015-06-16 NOTE — ED Provider Notes (Signed)
CSN: [id number]     Arrival date & time 06/16/15  0617 History   First MD Initiated Contact with Patient 06/16/15 0622     Chief Complaint  Patient presents with  . Shortness of Breath     (Consider location/radiation/quality/duration/timing/severity/associated sxs/prior Treatment) HPI Comments: Patient presents to the emergency department for evaluation of shortness of breath. Patient has been experiencing increased shortness of breath for the last 2 days. He has been experiencing nonproductive cough for several more days. He was seen by his primary care doctor and given an inhaler and prednisone for the cough. He does have a history of congestive heart failure. He reports that he took additional Lasix yesterday and felt better during the day, but then overnight became very short of breath again. He is unable to lie flat because of severe shortness of breath. He is not experiencing any chest pain.  Patient is a 82 y.o. male presenting with shortness of breath.  Shortness of Breath   Past Medical History  Diagnosis Date  . Heart murmur   . Hypercholesteremia   . HTN (hypertension)   . Diastolic CHF ([place])     2D echo 12/2010 - nl LVF, mild LAE, MAC with moderately thickened MV leaflets and trivial MR, normal functioning prosthetic AVR with mild increased gradient, grade II diastolic dysfunction   . Hypotension   . Orthostatic hypotension   . Aortic valve disorder 2009    severe AS s/p AVR with pericardial tissue valve  . Aortic stenosis   . Barrett esophagus   . CAD (coronary artery disease)     s/p 2 vessel CABG with LIMA to LAD, SVG to diagonal 1, SVG to RCA 12/09  . Cardiomyopathy ([place])   . H/O: rheumatic fever   . Atrial fibrillation ([place])     post op afib with no reoccurence  . Cardiomyopathy ([place])     a. Echo 5/16:  mild LVH, EF 30-35%, diff HK, AVR ok, mild MR, severe LAE, mod RAE, reduced RVSF.  Repeat limited echo 10/2014 with EF 25-30%  . History of PFTs     a. Amiodarone  started 5/16 >> PFTs w/ DLCO 6/16:  FEV1 72% predicted, FEV1/FVC 66%, DLCO 66% >> minimal reversible obstructive airways disease with mild diffusion defect (suggestive of emphysema but absence of hyperinflation inconsistent with dx)  . AICD (automatic cardioverter/defibrillator) present   . Asthma     "a touch"  . Diabetes ([place])     "I'm prediabetic" (04/22/2015)  . GERD (gastroesophageal reflux disease)   . History of hiatal hernia   . Hepatitis 1957    "don't know what kind:  . Arthritis     "minor everywhere" (04/22/2015)  . CKD (chronic kidney disease)   . Pneumonia 08/2014    "dr thought I may have had a touch"   Past Surgical History  Procedure Laterality Date  . Coronary artery bypass graft  03/10/2008    x 3 Dr. Roxan Hockey  . Aortic valve replacement  with 23-mm Magna Ease pericardial valve, model number    with 23-mm Magna Ease pericardial valve, model [id number], serial number [id number]   . Cataract extraction w/ intraocular lens  implant, bilateral Bilateral   . Cardioversion N/A 01/27/2013    Procedure: CARDIOVERSION;  Surgeon: Sueanne Margarita, MD;  Location: [facility];  Service: Cardiovascular;  Laterality: N/A;  . Cardioversion N/A 08/25/2014    Procedure: CARDIOVERSION;  Surgeon: Sanda Klein, MD;  Location: [place];  Service: Cardiovascular;  Laterality: N/A;  .  Cardiac catheterization N/A 11/04/2014    Procedure: Left Heart Cath and Cors/Grafts Angiography;  Surgeon: Leonie Man, MD;  Location: [facility];  Service: Cardiovascular;  Laterality: N/A;  . Bi-ventricular implantable cardioverter defibrillator  (crt-d)  04/22/2015  . Tonsillectomy    . Appendectomy  1940s  . Cardiac valve replacement    . Esophagogastroduodenoscopy (egd) with esophageal dilation  X1  . Ep implantable device N/A 04/22/2015    Procedure: BiV ICD Insertion CRT-D;  Surgeon: [name], MD;  Location: [facility];  Service: Cardiovascular;  Laterality: N/A;    Family History  Problem Relation Age of Onset  . Alzheimer's disease    . Alzheimer's disease Mother   . Cancer Sister   . COPD Sister   . Depression Sister   . Heart disease Sister   . Hyperlipidemia Sister   . Hypertension Sister   . COPD Daughter    Social History  Substance Use Topics  . Smoking status: Never Smoker   . Smokeless tobacco: Never Used  . Alcohol Use: Yes     Comment: 04/22/2015 "nothing since the 1980s; never had a problem w/it"    Review of Systems  Respiratory: Positive for shortness of breath.   Cardiovascular: Positive for leg swelling.  All other systems reviewed and are negative.     Allergies  Novocain  Home Medications   Prior to Admission medications   Medication Sig Start Date End Date Taking? Authorizing Provider  acetaminophen (TYLENOL) 500 MG tablet Take 500 mg by mouth every 6 (six) hours as needed for moderate pain.   Yes Historical Provider, MD  amiodarone (PACERONE) 200 MG tablet Take 1 tablet (200 mg total) by mouth daily. 09/03/14  Yes Liliane Shi, PA-C  apixaban (ELIQUIS) 2.5 MG TABS tablet Take 1 tablet (2.5 mg total) by mouth 2 (two) times daily. 01/19/15  Yes Sueanne Margarita, MD  atorvastatin (LIPITOR) 20 MG tablet Take 20 mg by mouth daily at 6 PM.    Yes Historical Provider, MD  carvedilol (COREG) 6.25 MG tablet Take 1 tablet (6.25 mg total) by mouth 2 (two) times daily with a meal. 02/16/15  Yes Sueanne Margarita, MD  doxycycline (VIBRA-TABS) 100 MG tablet Take 100 mg by mouth 2 (two) times daily.   Yes Historical Provider, MD  furosemide (LASIX) 20 MG tablet Take 1 tablet (20 mg total) by mouth daily. 05/02/15  Yes Velvet Bathe, MD  hydrALAZINE (APRESOLINE) 25 MG tablet Take 0.5 tablets (12.5 mg total) by mouth 2 (two) times daily before a meal. 02/16/15  Yes Sueanne Margarita, MD  isosorbide mononitrate (IMDUR) 30 MG 24 hr tablet Take 1 tablet (30 mg total) by mouth daily. 01/19/15  Yes Sueanne Margarita, MD  lisinopril  (PRINIVIL,ZESTRIL) 10 MG tablet Take 20 mg by mouth every evening.  02/16/15  Yes Sueanne Margarita, MD  Magnesium 250 MG TABS Take 1 tablet by mouth every evening.    Yes Historical Provider, MD  Multiple Vitamin (MULTIVITAMIN) capsule Take 1 capsule by mouth daily.   Yes Historical Provider, MD  omega-3 acid ethyl esters (LOVAZA) 1 G capsule Take 2 g by mouth 2 (two) times daily.   Yes Historical Provider, MD  ranitidine (ZANTAC) 300 MG tablet Take 300 mg by mouth at bedtime.   Yes Historical Provider, MD   BP 129/78 mmHg  Pulse 72  Temp(Src) 97 F (36.1 C) (Oral)  Resp 20  Ht 2' 4.35" (0.72 m)  Wt 192 lb 12.8 oz (87.454 kg)  BMI 168.70 kg/m2  SpO2 97% Physical Exam  Constitutional: He is oriented to person, place, and time. He appears well-developed and well-nourished. No distress.  HENT:  Head: Normocephalic and atraumatic.  Right Ear: Hearing normal.  Left Ear: Hearing normal.  Nose: Nose normal.  Mouth/Throat: Oropharynx is clear and moist and mucous membranes are normal.  Eyes: Conjunctivae and EOM are normal. Pupils are equal, round, and reactive to light.  Neck: Normal range of motion. Neck supple.  Cardiovascular: Regular rhythm, S1 normal and S2 normal.  Exam reveals no gallop and no friction rub.   No murmur heard. Pulmonary/Chest: Effort normal. No respiratory distress. He has decreased breath sounds. He has rales (scattered at bases). He exhibits no tenderness.  Abdominal: Soft. Normal appearance and bowel sounds are normal. There is no hepatosplenomegaly. There is no tenderness. There is no rebound, no guarding, no tenderness at McBurney's point and negative Murphy's sign. No hernia.  Musculoskeletal: Normal range of motion.  Neurological: He is alert and oriented to person, place, and time. He has normal strength. No cranial nerve deficit or sensory deficit. Coordination normal. GCS eye subscore is 4. GCS verbal subscore is 5. GCS motor subscore is 6.  Skin: Skin is warm,  dry and intact. No rash noted. No cyanosis.  Psychiatric: He has a normal mood and affect. His speech is normal and behavior is normal. Thought content normal.  Nursing note and vitals reviewed.   ED Course  Procedures (including critical care time) Labs Review Labs Reviewed  CBC WITH DIFFERENTIAL/PLATELET - Abnormal; Notable for the following:    WBC 11.5 (*)    Neutro Abs 8.3 (*)    Monocytes Absolute 2.0 (*)    All other components within normal limits  COMPREHENSIVE METABOLIC PANEL - Abnormal; Notable for the following:    Glucose, Bld 124 (*)    BUN 28 (*)    Creatinine, Ser 1.76 (*)    GFR calc non Af Amer 35 (*)    GFR calc Af Amer 40 (*)    All other components within normal limits  TROPONIN I - Abnormal; Notable for the following:    Troponin I 0.09 (*)    All other components within normal limits  BRAIN NATRIURETIC PEPTIDE - Abnormal; Notable for the following:    B Natriuretic Peptide 1510.4 (*)    All other components within normal limits  TROPONIN I - Abnormal; Notable for the following:    Troponin I 0.07 (*)    All other components within normal limits  TROPONIN I - Abnormal; Notable for the following:    Troponin I 0.09 (*)    All other components within normal limits  I-STAT ARTERIAL BLOOD GAS, ED - Abnormal; Notable for the following:    pH, Arterial 7.493 (*)    pCO2 arterial 32.2 (*)    Bicarbonate 24.7 (*)    All other components within normal limits  MRSA PCR SCREENING  INFLUENZA PANEL BY PCR (TYPE A & B, H1N1)  TROPONIN I  BASIC METABOLIC PANEL  CBC  I-STAT CG4 LACTIC ACID, ED    Imaging Review Dg Chest 2 View  06/16/2015  CLINICAL DATA:  Increasing shortness of breath this for now.  Cough. EXAM: CHEST  2 VIEW COMPARISON:  04/30/2015 FINDINGS: Postoperative changes in the mediastinum. Cardiac pacemaker. Normal heart size and pulmonary vascularity. Mild hyperinflation. No focal airspace disease or consolidation in the lungs. No blunting of  costophrenic angles.  No pneumothorax. Calcification of the aorta. Degenerative changes in the spine. IMPRESSION: No active cardiopulmonary disease. Electronically Signed   By: Lucienne Capers M.D.   On: 06/16/2015 06:59   I have personally reviewed and evaluated these images and lab results as part of my medical decision-making.   EKG Interpretation   Date/Time:  Thursday June 16 2015 06:22:48 EST Ventricular Rate:  76 PR Interval:  167 QRS Duration: 136 QT Interval:  460 QTC Calculation: 517 R Axis:   -87 Text Interpretation:  atrial-sensed ventricular-paced complexes Premature  ventricular complexes Confirmed by Betsey Holiday  MD, Matina Rodier ([id number]) on  06/16/2015 6:37:01 AM Also confirmed by Betsey Holiday  MD, [place] [phone]),  editor Gilford Rile, CCT, [place] ([id number])  on 06/16/2015 7:30:05 AM      MDM   Final diagnoses:  Acute combined systolic and diastolic congestive heart failure [facility])    Patient presents to the emergency department for evaluation of shortness of breath. Patient has had a 2 day history of worsening shortness of breath, especially orthopnea and paroxysmal nocturnal dyspnea. Patient does have a history of congestive heart failure. He recently had biventricular implanted defibrillator placed for treatment of his heart failure. Patient reports that he did have a hospitalization after placement of the defibrillator for congestive heart failure where he had 9 pounds of fluid diuresed. He is concerned that he is acutely bleeding fluid again. He has increased his Lasix at home with only mild improvement.  Examination reveals decreased breath sounds bilaterally and he does appear to be having some bronchospasm component. He is requiring supplemental oxygen. He was seen by his doctor this week and started on prednisone and albuterol but has not responded. I suspect that the majority of his shortness of breath is pulmonary secondary to upper respiratory infection and bronchospasm. Based on  his lack of response to the current outpatient therapy regimen, he will require hospitalization for further management.    Orpah Greek, MD 06/16/15 [phone]

## 2015-06-16 NOTE — ED Notes (Signed)
The pt took extra lasiz last pm that helped for qwhile  But then his breathing became worse

## 2015-06-16 NOTE — ED Notes (Signed)
The pt arrived by [facility] from home the pt has had sob for the past 2 days.  Non-productive cough unable to sleep when he is lying down to sleep.  No temp he came in on a nasal canula   Iv per ems  Alert skin warm and dry no distress

## 2015-06-17 DIAGNOSIS — I480 Paroxysmal atrial fibrillation: Secondary | ICD-10-CM

## 2015-06-17 DIAGNOSIS — Z7901 Long term (current) use of anticoagulants: Secondary | ICD-10-CM | POA: Diagnosis not present

## 2015-06-17 DIAGNOSIS — I482 Chronic atrial fibrillation: Secondary | ICD-10-CM | POA: Diagnosis not present

## 2015-06-17 DIAGNOSIS — N183 Chronic kidney disease, stage 3 (moderate): Secondary | ICD-10-CM

## 2015-06-17 DIAGNOSIS — I5041 Acute combined systolic (congestive) and diastolic (congestive) heart failure: Secondary | ICD-10-CM

## 2015-06-17 DIAGNOSIS — I255 Ischemic cardiomyopathy: Secondary | ICD-10-CM

## 2015-06-17 DIAGNOSIS — I10 Essential (primary) hypertension: Secondary | ICD-10-CM | POA: Diagnosis not present

## 2015-06-17 DIAGNOSIS — J209 Acute bronchitis, unspecified: Secondary | ICD-10-CM

## 2015-06-17 LAB — CBC
HCT: 42.6 % (ref 39.0–52.0)
HEMOGLOBIN: 13.7 g/dL (ref 13.0–17.0)
MCH: 27.5 pg (ref 26.0–34.0)
MCHC: 32.2 g/dL (ref 30.0–36.0)
MCV: 85.5 fL (ref 78.0–100.0)
Platelets: 241 10*3/uL (ref 150–400)
RBC: 4.98 MIL/uL (ref 4.22–5.81)
RDW: 14.8 % (ref 11.5–15.5)
WBC: 9.5 10*3/uL (ref 4.0–10.5)

## 2015-06-17 LAB — BASIC METABOLIC PANEL
ANION GAP: 14 (ref 5–15)
BUN: 42 mg/dL — ABNORMAL HIGH (ref 6–20)
CALCIUM: 8.8 mg/dL — AB (ref 8.9–10.3)
CO2: 24 mmol/L (ref 22–32)
Chloride: 101 mmol/L (ref 101–111)
Creatinine, Ser: 2.04 mg/dL — ABNORMAL HIGH (ref 0.61–1.24)
GFR, EST AFRICAN AMERICAN: 34 mL/min — AB (ref >60)
GFR, EST NON AFRICAN AMERICAN: 29 mL/min — AB (ref >60)
Glucose, Bld: 109 mg/dL — ABNORMAL HIGH (ref 65–99)
POTASSIUM: 3.7 mmol/L (ref 3.5–5.1)
Sodium: 139 mmol/L (ref 135–145)

## 2015-06-17 LAB — TROPONIN I: Troponin I: 0.09 ng/mL — ABNORMAL HIGH (ref <0.031)

## 2015-06-17 MED ORDER — IPRATROPIUM-ALBUTEROL 0.5-2.5 (3) MG/3ML IN SOLN
3.0000 mL | Freq: Two times a day (BID) | RESPIRATORY_TRACT | Status: AC
  Administered 2015-06-17 – 2015-06-18 (×2): 3 mL via RESPIRATORY_TRACT
  Filled 2015-06-17 (×2): qty 3

## 2015-06-17 MED ORDER — DM-GUAIFENESIN ER 30-600 MG PO TB12
1.0000 | ORAL_TABLET | Freq: Two times a day (BID) | ORAL | Status: DC
  Administered 2015-06-17 – 2015-06-19 (×5): 1 via ORAL
  Filled 2015-06-17 (×5): qty 1

## 2015-06-17 MED ORDER — GUAIFENESIN-DM 100-10 MG/5ML PO SYRP
5.0000 mL | ORAL_SOLUTION | ORAL | Status: DC | PRN
  Administered 2015-06-17 – 2015-06-19 (×2): 5 mL via ORAL
  Filled 2015-06-17 (×3): qty 5

## 2015-06-17 NOTE — Care Management Obs Status (Signed)
[place] NOTIFICATION   Patient Details  Name: [name] MRN: [id number] Date of Birth: September 14, 1932   Medicare Observation Status Notification Given:  Yes    Royston Bake, RN 06/17/2015, 1:46 PM

## 2015-06-17 NOTE — Progress Notes (Signed)
PROGRESS NOTE  [name] EEF:[id number] DOB: October 10, 1932 DOA: 06/16/2015 PCP: [place], BETTY G, MD  HPI/Recap of past 24 hours:  C/o congested cough when try to take deep breath, report breathing treatment helped, no obvious lower extremity edema, does has crackles at lung base, currently on 2liter oxygen  Assessment/Plan: Active Problems:   Essential hypertension   CAD status post CABG   Atrial fibrillation (HCC)   Chronic anticoagulation   CKD (chronic kidney disease) stage 3, GFR 30-59 ml/min   Cardiomyopathy, ischemic: EF roughly 30%   Congestive dilated cardiomyopathy (HCC)   CHF (congestive heart failure) (HCC)   Acute combined systolic and diastolic congestive heart failure (HCC)  Dyspnea secondary to acute combined systolic and diastolic CHF in the setting of cardiomyopathy most recent EF 15%. And possible URI - Continue his oxygen as needed for oxygen saturation more than 95% - Keep strict ins and outs, daily weight, s/p IV Lasix 40 mg IV twice a day since admission, now stopped per heart failure team, can hold diuretics for a day or two. - Continue with beta blockers, hold ACEi giving his chronic kidney disease,  - EF 15%, status post AICD - Unclear of infectious component attributing to his dyspnea, negative influenza panel, continue with by mouth doxycycline, continue with nebs.  CAD with history of CABG - With mildly elevated troponins, but chronically elevated, most likely in the setting of CHF,  troponins flat. - Not on aspirin, most likely because he is on Eliquis, defer management to cardiology  Hypertension - Continue with home medication and titrate at as needed  Paroxysmal atrial fibrillation - continue with beta blocker, continue with amiodarone, continue with Eliquis , currently sinus rhythm.  Chronic kidney disease - At baseline, continue to monitor as on IV diuresis, hold ACEi.  Hyperlipidemia  - Continue statin   DVT Prophylaxis Eliquis    Code Status: full  Family Communication: patient   Disposition Plan: remain in the hospital   Consultants:  Cardiology and heart failure team  Procedures:  none  Antibiotics:  doxcycline   Objective: BP 102/54 mmHg  Pulse 68  Temp(Src) 97.7 F (36.5 C) (Oral)  Resp 18  Ht 6' (1.829 m)  Wt 88.678 kg (195 lb 8 oz)  BMI 26.51 kg/m2  SpO2 96%  Intake/Output Summary (Last 24 hours) at 06/17/15 1335 Last data filed at 06/17/15 1040  Gross per 24 hour  Intake    485 ml  Output   1575 ml  Net  -1090 ml   Filed Weights   06/16/15 1306 06/16/15 1600 06/17/15 0432  Weight: 89.585 kg (197 lb 8 oz) 87.454 kg (192 lb 12.8 oz) 88.678 kg (195 lb 8 oz)    Exam:   General:  NAD  Cardiovascular: RRR  Respiratory: CTABL, mild crackles at basis  Abdomen: Soft/ND/NT, positive BS  Musculoskeletal: No Edema  Neuro: aaox3  Data Reviewed: Basic Metabolic Panel:  Recent Labs Lab 06/16/15 0632 06/17/15 0113  NA 138 139  K 3.9 3.7  CL 102 101  CO2 24 24  GLUCOSE 124* 109*  BUN 28* 42*  CREATININE 1.76* 2.04*  CALCIUM 8.9 8.8*   Liver Function Tests:  Recent Labs Lab 06/16/15 0632  AST 41  ALT 44  ALKPHOS 57  BILITOT 0.6  PROT 7.2  ALBUMIN 3.7   No results for input(s): LIPASE, AMYLASE in the last 168 hours. No results for input(s): AMMONIA in the last 168 hours. CBC:  Recent Labs Lab 06/16/15 [phone]  06/17/15 0113  WBC 11.5* 9.5  NEUTROABS 8.3*  --   HGB 14.5 13.7  HCT 43.4 42.6  MCV 85.1 85.5  PLT 252 241   Cardiac Enzymes:    Recent Labs Lab 06/16/15 0632 06/16/15 0830 06/16/15 1341 06/17/15 0113  TROPONINI 0.09* 0.09* 0.07* 0.09*   BNP (last 3 results)  Recent Labs  08/22/14 2044 04/30/15 1545 06/16/15 0632  BNP 592.3* 3478.3* 1510.4*    ProBNP (last 3 results) No results for input(s): PROBNP in the last 8760 hours.  CBG: No results for input(s): GLUCAP in the last 168 hours.  Recent Results (from the past 240  hour(s))  MRSA PCR Screening     Status: None   Collection Time: 06/16/15  4:00 PM  Result Value Ref Range Status   MRSA by PCR NEGATIVE NEGATIVE Final    Comment:        The GeneXpert MRSA Assay (FDA approved for NASAL specimens only), is one component of a comprehensive MRSA colonization surveillance program. It is not intended to diagnose MRSA infection nor to guide or monitor treatment for MRSA infections.      Studies: No results found.  Scheduled Meds: . amiodarone  200 mg Oral Daily  . apixaban  2.5 mg Oral BID  . atorvastatin  20 mg Oral q1800  . carvedilol  6.25 mg Oral BID WC  . doxycycline  100 mg Oral BID  . famotidine  10 mg Oral Daily  . furosemide  40 mg Intravenous BID  . hydrALAZINE  12.5 mg Oral BID AC  . isosorbide mononitrate  30 mg Oral Daily  . magnesium oxide  200 mg Oral QHS  . multivitamin with minerals  1 tablet Oral Daily  . omega-3 acid ethyl esters  2 g Oral BID  . sodium chloride flush  3 mL Intravenous Q12H    Continuous Infusions:    Time spent: 27mns  Kelse Ploch MD, PhD  [facility] Pager [phone] If 7PM-7AM, please contact night-coverage at [email or web address], password [email or web address]3/01/2016, 1:35 PM  LOS: 1 day

## 2015-06-17 NOTE — Progress Notes (Signed)
Advanced Heart Failure Rounding Note   Subjective:     Has diuresed ~2-3L weight down 3 pounds since admit. Creatinine 1.76-> 2.04. Still with cough but says breathing is better. Says he is dry.   Objective:   Weight Range:  Vital Signs:   Temp:  [97 F (36.1 C)-98.4 F (36.9 C)] 97.7 F (36.5 C) (03/10 1250) Pulse Rate:  [65-76] 68 (03/10 1250) Resp:  [18-20] 18 (03/10 1250) BP: (102-175)/(54-78) 102/54 mmHg (03/10 1250) SpO2:  [95 %-99 %] 96 % (03/10 1250) Weight:  [88.678 kg (195 lb 8 oz)] 88.678 kg (195 lb 8 oz) (03/10 0432) Last BM Date: 06/16/15  Weight change: Filed Weights   06/16/15 1306 06/16/15 1600 06/17/15 0432  Weight: 89.585 kg (197 lb 8 oz) 87.454 kg (192 lb 12.8 oz) 88.678 kg (195 lb 8 oz)    Intake/Output:   Intake/Output Summary (Last 24 hours) at 06/17/15 1707 Last data filed at 06/17/15 1438  Gross per 24 hour  Intake    485 ml  Output   1800 ml  Net  -1315 ml     Physical Exam: General: Frail elderly male in no acute distress Head: Eyes PERRLA, No xanthomas. Normocephalic and atraumatic, oropharynx without edema or exudate.  Lungs: Resp regular and unlabored. Diminished bibasilar.  Heart: RRR no s3, s4, or murmurs..  Neck: No carotid bruits. No lymphadenopathy. No JVD. Abdomen: Bowel sounds present, abdomen soft and non-tender without masses or hernias noted. Msk: No spine or cva tenderness. No weakness, no joint deformities or effusions. Extremities: No clubbing, cyanosis or edema. DP/PT/Radials 2+ and equal bilaterally. Neuro: Alert and oriented X 3. No focal deficits noted. Psych: Good affect, responds appropriately Skin: No rashes or lesions noted.    Labs: Basic Metabolic Panel:  Recent Labs Lab 06/16/15 0632 06/17/15 0113  NA 138 139  K 3.9 3.7  CL 102 101  CO2 24 24  GLUCOSE 124* 109*  BUN 28* 42*  CREATININE 1.76* 2.04*  CALCIUM 8.9 8.8*    Liver Function Tests:  Recent Labs Lab 06/16/15 0632  AST  41  ALT 44  ALKPHOS 57  BILITOT 0.6  PROT 7.2  ALBUMIN 3.7   No results for input(s): LIPASE, AMYLASE in the last 168 hours. No results for input(s): AMMONIA in the last 168 hours.  CBC:  Recent Labs Lab 06/16/15 0632 06/17/15 0113  WBC 11.5* 9.5  NEUTROABS 8.3*  --   HGB 14.5 13.7  HCT 43.4 42.6  MCV 85.1 85.5  PLT 252 241    Cardiac Enzymes:  Recent Labs Lab 06/16/15 0632 06/16/15 0830 06/16/15 1341 06/17/15 0113  TROPONINI 0.09* 0.09* 0.07* 0.09*    BNP: BNP (last 3 results)  Recent Labs  08/22/14 2044 04/30/15 1545 06/16/15 0632  BNP 592.3* 3478.3* 1510.4*    ProBNP (last 3 results) No results for input(s): PROBNP in the last 8760 hours.    Other results:  Imaging: Dg Chest 2 View  06/16/2015  CLINICAL DATA:  Increasing shortness of breath this for now.  Cough. EXAM: CHEST  2 VIEW COMPARISON:  04/30/2015 FINDINGS: Postoperative changes in the mediastinum. Cardiac pacemaker. Normal heart size and pulmonary vascularity. Mild hyperinflation. No focal airspace disease or consolidation in the lungs. No blunting of costophrenic angles. No pneumothorax. Calcification of the aorta. Degenerative changes in the spine. IMPRESSION: No active cardiopulmonary disease. Electronically Signed   By: Lucienne Capers M.D.   On: 06/16/2015 06:59      Medications:  Scheduled Medications: . amiodarone  200 mg Oral Daily  . apixaban  2.5 mg Oral BID  . atorvastatin  20 mg Oral q1800  . carvedilol  6.25 mg Oral BID WC  . dextromethorphan-guaiFENesin  1 tablet Oral BID  . doxycycline  100 mg Oral BID  . famotidine  10 mg Oral Daily  . furosemide  40 mg Intravenous BID  . hydrALAZINE  12.5 mg Oral BID AC  . ipratropium-albuterol  3 mL Nebulization Q12H  . isosorbide mononitrate  30 mg Oral Daily  . magnesium oxide  200 mg Oral QHS  . multivitamin with minerals  1 tablet Oral Daily  . omega-3 acid ethyl esters  2 g Oral BID  . sodium chloride flush  3 mL  Intravenous Q12H     Infusions:     PRN Medications:  sodium chloride, acetaminophen, acetaminophen, albuterol, guaiFENesin-dextromethorphan, ondansetron (ZOFRAN) IV, sodium chloride flush   Assessment:   1 Acute respiratory failure 2. Acute bronchitis 3. A/c systolic HF. EF 15-20% by echo 1/17 due to ICM, s/p CRT-d 4. CAD s/p CABG 5. AS s/p bioprosthetic AVR 6. Acute on CKD, stage IV 7. PAF  Plan/Discussion:    Suspect main issue is viral URI. He is dry. Would stop IV lasix for now particularly with rise in creatinine. Can hold all diuretics for a day or two and then resume home diuretics. Cardiology will sign off. Please call with questions.    Length of Stay: 1   Bridgitte Felicetti MD 06/17/2015, 5:07 PM  Advanced Heart Failure Team Pager [phone] (M-F; 7a - 4p)  Please contact [facility] for night-coverage after hours (4p -7a ) and weekends on [email or web address]

## 2015-06-17 NOTE — Progress Notes (Signed)
Pharmacist Heart Failure Core Measure Documentation  Assessment: TUDOR CHANDLEY has an EF documented as 15% -   20% on 05/01/2015 by echo.  Rationale: Heart failure patients with left ventricular systolic dysfunction (LVSD) and an EF < 40% should be prescribed an angiotensin converting enzyme inhibitor (ACEI) or angiotensin receptor blocker (ARB) at discharge unless a contraindication is documented in the medical record.  This patient is not currently on an ACEI or ARB for HF.  This note is being placed in the record in order to provide documentation that a contraindication to the use of these agents is present for this encounter.  ACE Inhibitor or Angiotensin Receptor Blocker is contraindicated (specify all that apply)  _0   ACEI allergy AND ARB allergy _1   Angioedema _2   Moderate or severe aortic stenosis _3   Hyperkalemia _4   Hypotension _5   Renal artery stenosis _6   Worsening renal function, preexisting renal disease or dysfunction  scr 2.04 (BL 1.5ish), LFTs wnl, per cardiology note: Continue beta blocker/hydralazine/Imdur/statin. Held ACE  in setting of chronic kidney disease.  Manley Mason 06/17/2015 11:47 AM

## 2015-06-18 DIAGNOSIS — N183 Chronic kidney disease, stage 3 (moderate): Secondary | ICD-10-CM | POA: Diagnosis not present

## 2015-06-18 DIAGNOSIS — I480 Paroxysmal atrial fibrillation: Secondary | ICD-10-CM | POA: Diagnosis not present

## 2015-06-18 DIAGNOSIS — Z7901 Long term (current) use of anticoagulants: Secondary | ICD-10-CM | POA: Diagnosis not present

## 2015-06-18 DIAGNOSIS — I10 Essential (primary) hypertension: Secondary | ICD-10-CM | POA: Diagnosis not present

## 2015-06-18 DIAGNOSIS — I482 Chronic atrial fibrillation: Secondary | ICD-10-CM | POA: Diagnosis not present

## 2015-06-18 DIAGNOSIS — I255 Ischemic cardiomyopathy: Secondary | ICD-10-CM | POA: Diagnosis not present

## 2015-06-18 DIAGNOSIS — I5041 Acute combined systolic (congestive) and diastolic (congestive) heart failure: Secondary | ICD-10-CM | POA: Diagnosis not present

## 2015-06-18 LAB — BASIC METABOLIC PANEL
ANION GAP: 11 (ref 5–15)
BUN: 47 mg/dL — ABNORMAL HIGH (ref 6–20)
CHLORIDE: 101 mmol/L (ref 101–111)
CO2: 28 mmol/L (ref 22–32)
CREATININE: 1.87 mg/dL — AB (ref 0.61–1.24)
Calcium: 8.7 mg/dL — ABNORMAL LOW (ref 8.9–10.3)
GFR calc non Af Amer: 32 mL/min — ABNORMAL LOW (ref >60)
GFR, EST AFRICAN AMERICAN: 37 mL/min — AB (ref >60)
Glucose, Bld: 118 mg/dL — ABNORMAL HIGH (ref 65–99)
POTASSIUM: 3.9 mmol/L (ref 3.5–5.1)
SODIUM: 140 mmol/L (ref 135–145)

## 2015-06-18 NOTE — Progress Notes (Signed)
PROGRESS NOTE  [name] NGE:[id number] DOB: May 27, 1932 DOA: 06/16/2015 PCP: Martinique, BETTY G, MD  HPI/Recap of past 24 hours:  Off oxygen, reported has walked, denies sob when walking, but report persistent congested cough  Assessment/Plan: Active Problems:   Essential hypertension   CAD status post CABG   Atrial fibrillation (HCC)   Chronic anticoagulation   CKD (chronic kidney disease) stage 3, GFR 30-59 ml/min   Cardiomyopathy, ischemic: EF roughly 30%   Congestive dilated cardiomyopathy (HCC)   CHF (congestive heart failure) (HCC)   Acute combined systolic and diastolic congestive heart failure (HCC)  Dyspnea secondary to acute combined systolic and diastolic CHF in the setting of cardiomyopathy most recent EF 15%. And possible URI - EF 15%, status post AICD -Keep strict ins and outs, daily weight, s/p IV Lasix 40 mg IV twice a day since admission, lasix was stopped on3/10 per heart failure team, can hold diuretics for a day or two.  - Continue with beta blockers, hold ACEi giving his chronic kidney disease,  - Unclear of infectious component attributing to his dyspnea, negative influenza panel, continue with by mouth doxycycline, continue with nebs/mucinex.  CAD with history of CABG - With mildly elevated troponins, but chronically elevated, most likely in the setting of CHF,  troponins flat. - Not on aspirin, most likely because he is on Eliquis, defer management to cardiology  Hypertension - Continue with home medication and titrate at as needed  Paroxysmal atrial fibrillation - continue with beta blocker, continue with amiodarone, continue with Eliquis , currently sinus rhythm.  Chronic kidney disease - At baseline, continue to monitor as on IV diuresis, hold ACEi.  Hyperlipidemia  - Continue statin   DVT Prophylaxis Eliquis   Code Status: full  Family Communication: patient   Disposition Plan: home in 1-2 days   Consultants:  Cardiology and heart  failure team  Procedures:  none  Antibiotics:  doxcycline   Objective: BP 110/60 mmHg  Pulse 63  Temp(Src) 97.8 F (36.6 C) (Oral)  Resp 18  Ht 6' (1.829 m)  Wt 88.678 kg (195 lb 8 oz)  BMI 26.51 kg/m2  SpO2 98%  Intake/Output Summary (Last 24 hours) at 06/18/15 1854 Last data filed at 06/18/15 1300  Gross per 24 hour  Intake    600 ml  Output   1475 ml  Net   -875 ml   Filed Weights   06/16/15 1600 06/17/15 0432 06/18/15 0557  Weight: 87.454 kg (192 lb 12.8 oz) 88.678 kg (195 lb 8 oz) 88.678 kg (195 lb 8 oz)    Exam:   General:  NAD  Cardiovascular: RRR  Respiratory: CTABL, mild crackles at basis  Abdomen: Soft/ND/NT, positive BS  Musculoskeletal: No Edema  Neuro: aaox3  Data Reviewed: Basic Metabolic Panel:  Recent Labs Lab 06/16/15 0632 06/17/15 0113 06/18/15 0521  NA 138 139 140  K 3.9 3.7 3.9  CL 102 101 101  CO2 _0 GLUCOSE 124* 109* 118*  BUN 28* 42* 47*  CREATININE 1.76* 2.04* 1.87*  CALCIUM 8.9 8.8* 8.7*   Liver Function Tests:  Recent Labs Lab 06/16/15 0632  AST 41  ALT 44  ALKPHOS 57  BILITOT 0.6  PROT 7.2  ALBUMIN 3.7   No results for input(s): LIPASE, AMYLASE in the last 168 hours. No results for input(s): AMMONIA in the last 168 hours. CBC:  Recent Labs Lab 06/16/15 0632 06/17/15 0113  WBC 11.5* 9.5  NEUTROABS 8.3*  --  HGB 14.5 13.7  HCT 43.4 42.6  MCV 85.1 85.5  PLT 252 241   Cardiac Enzymes:    Recent Labs Lab 06/16/15 0632 06/16/15 0830 06/16/15 1341 06/17/15 0113  TROPONINI 0.09* 0.09* 0.07* 0.09*   BNP (last 3 results)  Recent Labs  08/22/14 2044 04/30/15 1545 06/16/15 0632  BNP 592.3* 3478.3* 1510.4*    ProBNP (last 3 results) No results for input(s): PROBNP in the last 8760 hours.  CBG: No results for input(s): GLUCAP in the last 168 hours.  Recent Results (from the past 240 hour(s))  MRSA PCR Screening     Status: None   Collection Time: 06/16/15  4:00 PM  Result  Value Ref Range Status   MRSA by PCR NEGATIVE NEGATIVE Final    Comment:        The GeneXpert MRSA Assay (FDA approved for NASAL specimens only), is one component of a comprehensive MRSA colonization surveillance program. It is not intended to diagnose MRSA infection nor to guide or monitor treatment for MRSA infections.      Studies: No results found.  Scheduled Meds: . amiodarone  200 mg Oral Daily  . apixaban  2.5 mg Oral BID  . atorvastatin  20 mg Oral q1800  . carvedilol  6.25 mg Oral BID WC  . dextromethorphan-guaiFENesin  1 tablet Oral BID  . doxycycline  100 mg Oral BID  . famotidine  10 mg Oral Daily  . hydrALAZINE  12.5 mg Oral BID AC  . ipratropium-albuterol  3 mL Nebulization Q12H  . isosorbide mononitrate  30 mg Oral Daily  . magnesium oxide  200 mg Oral QHS  . multivitamin with minerals  1 tablet Oral Daily  . omega-3 acid ethyl esters  2 g Oral BID  . sodium chloride flush  3 mL Intravenous Q12H    Continuous Infusions:    Time spent: 28mns  Infinity Jeffords MD, PhD  [facility] Pager [phone] If 7PM-7AM, please contact night-coverage at [email or web address], password [facility]3/02/2016, 6:54 PM  LOS: 2 days

## 2015-06-19 DIAGNOSIS — I482 Chronic atrial fibrillation: Secondary | ICD-10-CM | POA: Diagnosis not present

## 2015-06-19 DIAGNOSIS — I10 Essential (primary) hypertension: Secondary | ICD-10-CM

## 2015-06-19 DIAGNOSIS — I5041 Acute combined systolic (congestive) and diastolic (congestive) heart failure: Secondary | ICD-10-CM | POA: Diagnosis not present

## 2015-06-19 DIAGNOSIS — I480 Paroxysmal atrial fibrillation: Secondary | ICD-10-CM | POA: Diagnosis not present

## 2015-06-19 DIAGNOSIS — Z7901 Long term (current) use of anticoagulants: Secondary | ICD-10-CM | POA: Diagnosis not present

## 2015-06-19 DIAGNOSIS — N183 Chronic kidney disease, stage 3 (moderate): Secondary | ICD-10-CM | POA: Diagnosis not present

## 2015-06-19 DIAGNOSIS — I255 Ischemic cardiomyopathy: Secondary | ICD-10-CM | POA: Diagnosis not present

## 2015-06-19 LAB — BASIC METABOLIC PANEL
ANION GAP: 11 (ref 5–15)
BUN: 44 mg/dL — ABNORMAL HIGH (ref 6–20)
CALCIUM: 9 mg/dL (ref 8.9–10.3)
CO2: 25 mmol/L (ref 22–32)
Chloride: 101 mmol/L (ref 101–111)
Creatinine, Ser: 1.75 mg/dL — ABNORMAL HIGH (ref 0.61–1.24)
GFR, EST AFRICAN AMERICAN: 41 mL/min — AB (ref >60)
GFR, EST NON AFRICAN AMERICAN: 35 mL/min — AB (ref >60)
Glucose, Bld: 124 mg/dL — ABNORMAL HIGH (ref 65–99)
POTASSIUM: 4.1 mmol/L (ref 3.5–5.1)
Sodium: 137 mmol/L (ref 135–145)

## 2015-06-19 MED ORDER — DOXYCYCLINE HYCLATE 100 MG PO TABS: 100.0000 mg | ORAL_TABLET | Freq: Two times a day (BID) | ORAL | Status: DC

## 2015-06-19 MED ORDER — DM-GUAIFENESIN ER 30-600 MG PO TB12: 1.0000 | ORAL_TABLET | Freq: Two times a day (BID) | ORAL | Status: DC

## 2015-06-19 NOTE — Progress Notes (Signed)
SATURATION QUALIFICATIONS: (This note is used to comply with regulatory documentation for home oxygen)  Patient Saturations on Room Air at Rest = *96%  Patient Saturations on Room Air while Ambulating = 93%  Patient Saturations on n/a Liters of oxygen while Ambulating = n/a  Please briefly explain why patient needs home oxygen:n/a

## 2015-06-19 NOTE — Discharge Summary (Signed)
Discharge Summary  STANFORD STRAUCH PFY:[id number] DOB: 1933/01/23  PCP: Martinique, BETTY G, MD  Admit date: 06/16/2015 Discharge date: 06/19/2015  Time spent: <48mns  Recommendations for Outpatient Follow-up:  1. F/u with PMD within a week  for hospital discharge follow up, repeat cbc/bmp at follow up 2. F/u with cardiology as scheduled prior to hospitalization. 3. Medication change: d/c lisinopril due to renal impairment. Continue doxycycline to finish course for bronchitis.   Discharge Diagnoses:  Active Hospital Problems   Diagnosis Date Noted  . Acute combined systolic and diastolic congestive heart failure ([place]   . CHF (congestive heart failure) ([place] 04/22/2015  . Cardiomyopathy, ischemic: EF roughly 30% 11/04/2014  . Congestive dilated cardiomyopathy ([place]   . CKD (chronic kidney disease) stage 3, GFR 30-59 ml/min 08/23/2014  . Atrial fibrillation ([place] 01/23/2013  . Chronic anticoagulation 01/23/2013  . CAD status post CABG 01/23/2013  . Essential hypertension 01/23/2013    Resolved Hospital Problems   Diagnosis Date Noted Date Resolved  No resolved problems to display.    Discharge Condition: stable  Diet recommendation: heart healthy/carb modified  Filed Weights   06/17/15 0432 06/18/15 0557 06/19/15 0438  Weight: 88.678 kg (195 lb 8 oz) 88.678 kg (195 lb 8 oz) 89.086 kg (196 lb 6.4 oz)    History of present illness:  [name] a 82y.o. male, with a h/o hypertension, aortic stenosis status post AVR, chronic combined systolic and diastolic CHF in the setting of A. fib with RVR, orthostatic hypotension, dilated cardiomyopathy EF 15-20 %, CAD status post CABG, and paroxysmal atrial fibrillation, CKD, patient presents with complaints of progressive dyspnea, reports cough of dyspnea, I saw his PCP, who increase his Lasix, did put him on prednisone taper, no improvement of symptoms, came today to ED, reports dyspnea mainly upon laying supine, has been sleeping  sitting up of last 2 days, denies any lower extremity edema, but he has been compliant with medication, in ED workup significant for elevated BNP(but its lower than his baseline), mildly elevated troponins(chronically elevated), chest x-ray with no evidence of pulmonary edema or volume overload, patient remains dyspneic, tachypneic, as well patient had few runs of NSVT in ED ,I was called to admit.   Hospital Course:  Active Problems:   Essential hypertension   CAD status post CABG   Atrial fibrillation (HCC)   Chronic anticoagulation   CKD (chronic kidney disease) stage 3, GFR 30-59 ml/min   Cardiomyopathy, ischemic: EF roughly 30%   Congestive dilated cardiomyopathy (HCC)   CHF (congestive heart failure) ([place]   Acute combined systolic and diastolic congestive heart failure ([place]  Dyspnea secondary to acute combined systolic and diastolic CHF in the setting of cardiomyopathy most recent EF 15%. And possible URI - EF 15%, status post AICD -Keep strict ins and outs, daily weight, s/p IV Lasix 40 mg IV twice a day since admission, lasix was stopped on3/10 per heart failure team, can hold diuretics for a day or two.  - Continue with beta blockers, hold ACEi giving his chronic kidney disease,  - Unclear of infectious component attributing to his dyspnea, negative influenza panel, better, ontinue with by mouth doxycycline to finish course.   CAD with history of CABG - With mildly elevated troponins, but chronically elevated, most likely in the setting of CHF, troponins flat. - Not on aspirin, most likely because he is on Eliquis, defer management to cardiology  Hypertension - Continue with home medication, acei held due to impaired renal function  Paroxysmal atrial fibrillation - continue with beta blocker, continue with amiodarone, continue with Eliquis , currently sinus rhythm.  Chronic kidney disease - At baseline, continue to monitor as on IV diuresis, hold ACEi.  Hyperlipidemia    - Continue statin   DVT Prophylaxis Eliquis   Code Status: full  Family Communication: patient   Disposition Plan: home on 3/12, continue pmd and cardiology follow up   Consultants:  Cardiology and heart failure team  Procedures:  none  Antibiotics:  doxcycline   Discharge Exam: BP 136/69 mmHg  Pulse 69  Temp(Src) 97.4 F (36.3 C) (Oral)  Resp 18  Ht 6' (1.829 m)  Wt 89.086 kg (196 lb 6.4 oz)  BMI 26.63 kg/m2  SpO2 96%   General: NAD  Cardiovascular: RRR  Respiratory: CTABL, mild crackles at basis has resolved at discharge  Abdomen: Soft/ND/NT, positive BS  Musculoskeletal: No Edema  Neuro: aaox3   Discharge Instructions You were cared for by a hospitalist during your hospital stay. If you have any questions about your discharge medications or the care you received while you were in the hospital after you are discharged, you can call the unit and asked to speak with the hospitalist on call if the hospitalist that took care of you is not available. Once you are discharged, your primary care physician will handle any further medical issues. Please note that NO REFILLS for any discharge medications will be authorized once you are discharged, as it is imperative that you return to your primary care physician (or establish a relationship with a primary care physician if you do not have one) for your aftercare needs so that they can reassess your need for medications and monitor your lab values.      Discharge Instructions    Diet - low sodium heart healthy    Complete by:  As directed      Increase activity slowly    Complete by:  As directed             Medication List    STOP taking these medications        lisinopril 10 MG tablet  Commonly known as:  PRINIVIL,ZESTRIL      TAKE these medications        acetaminophen 500 MG tablet  Commonly known as:  TYLENOL  Take 500 mg by mouth every 6 (six) hours as needed for moderate pain.      amiodarone 200 MG tablet  Commonly known as:  PACERONE  Take 1 tablet (200 mg total) by mouth daily.     apixaban 2.5 MG Tabs tablet  Commonly known as:  ELIQUIS  Take 1 tablet (2.5 mg total) by mouth 2 (two) times daily.     atorvastatin 20 MG tablet  Commonly known as:  LIPITOR  Take 20 mg by mouth daily at 6 PM.     carvedilol 6.25 MG tablet  Commonly known as:  COREG  Take 1 tablet (6.25 mg total) by mouth 2 (two) times daily with a meal.     dextromethorphan-guaiFENesin 30-600 MG 12hr tablet  Commonly known as:  MUCINEX DM  Take 1 tablet by mouth 2 (two) times daily.     doxycycline 100 MG tablet  Commonly known as:  VIBRA-TABS  Take 1 tablet (100 mg total) by mouth 2 (two) times daily.     furosemide 20 MG tablet  Commonly known as:  LASIX  Take 1 tablet (20 mg total) by mouth daily.  hydrALAZINE 25 MG tablet  Commonly known as:  APRESOLINE  Take 0.5 tablets (12.5 mg total) by mouth 2 (two) times daily before a meal.     isosorbide mononitrate 30 MG 24 hr tablet  Commonly known as:  IMDUR  Take 1 tablet (30 mg total) by mouth daily.     Magnesium 250 MG Tabs  Take 1 tablet by mouth every evening.     multivitamin capsule  Take 1 capsule by mouth daily.     omega-3 acid ethyl esters 1 g capsule  Commonly known as:  LOVAZA  Take 2 g by mouth 2 (two) times daily.     ranitidine 300 MG tablet  Commonly known as:  ZANTAC  Take 300 mg by mouth at bedtime.       Allergies  Allergen Reactions  . Novocain [Procaine] Swelling   Follow-up Information    Follow up with Martinique, Malka So, MD.   Specialty:  Family Medicine   Why:  with primary care physician, repeat cbc/bmp at follow up   Contact information:   [place] [address] [phone]       Follow up with f/u with [facility] carroll spinks on 3/23.       The results of significant diagnostics from this hospitalization (including imaging, microbiology, ancillary and laboratory)  are listed below for reference.    Significant Diagnostic Studies: Dg Chest 2 View  06/16/2015  CLINICAL DATA:  Increasing shortness of breath this for now.  Cough. EXAM: CHEST  2 VIEW COMPARISON:  04/30/2015 FINDINGS: Postoperative changes in the mediastinum. Cardiac pacemaker. Normal heart size and pulmonary vascularity. Mild hyperinflation. No focal airspace disease or consolidation in the lungs. No blunting of costophrenic angles. No pneumothorax. Calcification of the aorta. Degenerative changes in the spine. IMPRESSION: No active cardiopulmonary disease. Electronically Signed   By: Lucienne Capers M.D.   On: 06/16/2015 06:59    Microbiology: Recent Results (from the past 240 hour(s))  MRSA PCR Screening     Status: None   Collection Time: 06/16/15  4:00 PM  Result Value Ref Range Status   MRSA by PCR NEGATIVE NEGATIVE Final    Comment:        The GeneXpert MRSA Assay (FDA approved for NASAL specimens only), is one component of a comprehensive MRSA colonization surveillance program. It is not intended to diagnose MRSA infection nor to guide or monitor treatment for MRSA infections.      Labs: Basic Metabolic Panel:  Recent Labs Lab 06/16/15 0632 06/17/15 0113 06/18/15 0521 06/19/15 0442  NA 138 139 140 137  K 3.9 3.7 3.9 4.1  CL 102 101 101 101  CO2 _0 GLUCOSE 124* 109* 118* 124*  BUN 28* 42* 47* 44*  CREATININE 1.76* 2.04* 1.87* 1.75*  CALCIUM 8.9 8.8* 8.7* 9.0   Liver Function Tests:  Recent Labs Lab 06/16/15 0632  AST 41  ALT 44  ALKPHOS 57  BILITOT 0.6  PROT 7.2  ALBUMIN 3.7   No results for input(s): LIPASE, AMYLASE in the last 168 hours. No results for input(s): AMMONIA in the last 168 hours. CBC:  Recent Labs Lab 06/16/15 0632 06/17/15 0113  WBC 11.5* 9.5  NEUTROABS 8.3*  --   HGB 14.5 13.7  HCT 43.4 42.6  MCV 85.1 85.5  PLT 252 241   Cardiac Enzymes:  Recent Labs Lab 06/16/15 0632 06/16/15 0830 06/16/15 1341  06/17/15 0113  TROPONINI 0.09* 0.09* 0.07* 0.09*  BNP: BNP (last 3 results)  Recent Labs  08/22/14 2044 04/30/15 1545 06/16/15 0632  BNP 592.3* 3478.3* 1510.4*    ProBNP (last 3 results) No results for input(s): PROBNP in the last 8760 hours.  CBG: No results for input(s): GLUCAP in the last 168 hours.     SignedFlorencia Reasons MD, PhD  [facility] 06/19/2015, 10:25 AM

## 2015-06-19 NOTE — Discharge Instructions (Signed)
Information on my medicine - ELIQUIS (apixaban)  This medication education was reviewed with me or my healthcare representative as part of my discharge preparation.  The pharmacist that spoke with me during my hospital stay was:  Myrene Galas, [facility]  Why was Eliquis prescribed for you? Eliquis was prescribed for you to reduce the risk of forming blood clots that can cause a stroke if you have a medical condition called atrial fibrillation (a type of irregular heartbeat) OR to reduce the risk of a blood clots forming after orthopedic surgery.  What do You need to know about Eliquis ? Take your Eliquis TWICE DAILY - one tablet in the morning and one tablet in the evening with or without food.  It would be best to take the doses about the same time each day.  If you have difficulty swallowing the tablet whole please discuss with your pharmacist how to take the medication safely.  Take Eliquis exactly as prescribed by your doctor and DO NOT stop taking Eliquis without talking to the doctor who prescribed the medication.  Stopping may increase your risk of developing a new clot or stroke.  Refill your prescription before you run out.  After discharge, you should have regular check-up appointments with your healthcare provider that is prescribing your Eliquis.  In the future your dose may need to be changed if your kidney function or weight changes by a significant amount or as you get older.  What do you do if you miss a dose? If you miss a dose, take it as soon as you remember on the same day and resume taking twice daily.  Do not take more than one dose of ELIQUIS at the same time.  Important Safety Information A possible side effect of Eliquis is bleeding. You should call your healthcare provider right away if you experience any of the following: ? Bleeding from an injury or your nose that does not stop. ? Unusual colored urine (red or dark brown) or unusual colored stools (red or  black). ? Unusual bruising for unknown reasons. ? A serious fall or if you hit your head (even if there is no bleeding).  Some medicines may interact with Eliquis and might increase your risk of bleeding or clotting while on Eliquis. To help avoid this, consult your healthcare provider or pharmacist prior to using any new prescription or non-prescription medications, including herbals, vitamins, non-steroidal anti-inflammatory drugs (NSAIDs) and supplements.  This website has more information on Eliquis (apixaban): [email or web address].[email or web address].

## 2015-06-21 ENCOUNTER — Other Ambulatory Visit: Payer: Self-pay | Admitting: *Deleted

## 2015-06-21 NOTE — Patient Outreach (Signed)
Pt had hospitalization for CHF/Bronchitis. He did everything he was supposed to do to avoid going. He went to see his MD first but the treatments did not seem to help and he became too SOB to stay at home. He does not exhibit the usual signs of exacerbation of wt gain and edema. He becomes suddenly dyspneic His kidneys also show deterioration with increasing his diuretic dose so we cannot be too aggressive.  I assured pt that we would work together on fine tuning his warning signs and what we should do to intervene.  He will see Dr. Martinique next Monday and I will see him on March 23.  Deloria Lair [facility] [place] [phone] .

## 2015-06-27 DIAGNOSIS — I1310 Hypertensive heart and chronic kidney disease without heart failure, with stage 1 through stage 4 chronic kidney disease, or unspecified chronic kidney disease: Secondary | ICD-10-CM | POA: Diagnosis not present

## 2015-06-27 DIAGNOSIS — I5041 Acute combined systolic (congestive) and diastolic (congestive) heart failure: Secondary | ICD-10-CM | POA: Diagnosis not present

## 2015-06-27 DIAGNOSIS — N183 Chronic kidney disease, stage 3 (moderate): Secondary | ICD-10-CM | POA: Diagnosis not present

## 2015-06-27 DIAGNOSIS — J219 Acute bronchiolitis, unspecified: Secondary | ICD-10-CM | POA: Diagnosis not present

## 2015-06-28 ENCOUNTER — Other Ambulatory Visit: Payer: Self-pay | Admitting: *Deleted

## 2015-06-28 NOTE — Patient Outreach (Signed)
Transition of care call #2 - Pt is feeling better. He was able to take the rolling trash container to the road without SOB. He says he is also ready to start planting his garden. He saw [name], MD, yesterday and she said his congestion seemed to be all clear. He is following the CHF Daily Maintenance protocol.   I will see pt at his home on Thursday.  Deloria Lair [facility] [place] [phone]

## 2015-06-30 ENCOUNTER — Other Ambulatory Visit: Payer: Self-pay | Admitting: *Deleted

## 2015-06-30 NOTE — Patient Outreach (Signed)
[name]) Care Management  06/30/2015  [name] [birth year] [id number]   S:  Pt had admission for CHF and Bronchiolitis. He had gone to his primary MD when he became SOB and was evaluated and treated. He then became so SOB that he called 911 and went to the hospital.   He has been to see his primary provider and his cardiologist since his discharge. Today, he is feeling good. He reports his lisinopril was stopped and he reports feeling like he is voiding easier and greater volumes since he has not been taking it.   O:  BP 142/66 mmHg  Pulse 62  Resp 16  Ht 1.829 m (6')  Wt 194 lb (87.998 kg)  BMI 26.31 kg/m2  SpO2 98%       RRR       Lungs clear       No edema  A:  CHF       Bronchiolitis  P:  Reviewed CHF Action Plan - emphasized calling MD with any increase in SOB.       Provided pt disposable masks so he can safely go to public places without danger of reinfection.       I will call pt again next week.  Deloria Lair [facility] [place] [phone]

## 2015-07-05 ENCOUNTER — Ambulatory Visit: Payer: Commercial Managed Care - HMO | Admitting: Cardiology

## 2015-07-07 ENCOUNTER — Other Ambulatory Visit: Payer: Self-pay | Admitting: *Deleted

## 2015-07-07 ENCOUNTER — Ambulatory Visit: Payer: Self-pay | Admitting: *Deleted

## 2015-07-07 NOTE — Patient Outreach (Signed)
Transiton of care call #3 - Pt is doing well. He has planted some of his garden items in his greenhouse. 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 He is not working any today because of the cooler, damp air. His weight is stable. I will call him again next week. Reinforced to call me for any problems.  Deloria Lair [facility] [place] [phone]

## 2015-07-14 ENCOUNTER — Other Ambulatory Visit: Payer: Self-pay | Admitting: *Deleted

## 2015-07-15 NOTE — Patient Outreach (Signed)
Transition of care call #4. Pt is doing very well. Weight is stable, no edema, no SOB. He reports his legs get weak though. 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   I have advised him he needs to increase his activity a little each day to increase his endurance. He says he will.  I praised him for his good daily self maintenance. I will call him again next week.  Deloria Lair [facility] [place] [phone]

## 2015-07-21 ENCOUNTER — Other Ambulatory Visit: Payer: Self-pay | Admitting: *Deleted

## 2015-07-21 NOTE — Patient Outreach (Signed)
Transition of care call #5 - Pt is doing very well. He is planting a big garden. He denies any SOB or edema and his wt. Is stable. He doesn't really think he needs my coaching any longer but I have asked if I can see him at home one more time. I will see him on 4/27.  Deloria Lair [facility] [place] [phone]

## 2015-07-27 ENCOUNTER — Ambulatory Visit: Payer: Commercial Managed Care - HMO | Admitting: Cardiology

## 2015-07-27 NOTE — Progress Notes (Signed)
Electrophysiology Office Note   Date:  07/28/2015   ID:  [name], DOB 08/24/1932, MRN [id number]  PCP:  [name], MD  Cardiologist:  [name] Primary Electrophysiologist: Adelaido Nicklaus Meredith Leeds, MD    Chief Complaint  Patient presents with  . Pacemaker Check     History of Present Illness: [name] is a 82 y.o. male who presents today for electrophysiology evaluation.   He has a history of hypertension, aortic stenosis status post AVR, chronic combined systolic and diastolic CHF in the setting of A. fib with RVR, orthostatic hypotension, dilated cardiomyopathy EF 25-30%, status post CABG, and paroxysmal atrial fibrillation.  He does have paroxysmal atrial fibrillation has been cardioverted in the past and is now on a fixed within with rhythm control via amiodarone. He had CRT-D upgrade on 04/22/15.  He is feeling much better since his by the upgrade. He has more energy, and is able to take the trash all related Street, work in his garden, and working in his shop for multiple hours a day. Unfortunately, his sister died last week and he was very close with her so he is sad about that.  Today, he denies symptoms of palpitations, chest pain, orthopnea, PND, lower extremity edema, claudication, dizziness, presyncope, syncope, bleeding, or neurologic sequela. The patient is tolerating medications without difficulties and is otherwise without complaint today.    Past Medical History  Diagnosis Date  . Heart murmur   . Hypercholesteremia   . HTN (hypertension)   . Diastolic CHF ([place])     2D echo 12/2010 - nl LVF, mild LAE, MAC with moderately thickened MV leaflets and trivial MR, normal functioning prosthetic AVR with mild increased gradient, grade II diastolic dysfunction   . Hypotension   . Orthostatic hypotension   . Aortic valve disorder 2009    severe AS s/p AVR with pericardial tissue valve  . Aortic stenosis   . Barrett esophagus   . CAD (coronary artery disease)      s/p 2 vessel CABG with LIMA to LAD, SVG to diagonal 1, SVG to RCA 12/09  . Cardiomyopathy ([place])   . H/O: rheumatic fever   . Atrial fibrillation ([place])     post op afib with no reoccurence  . Cardiomyopathy ([place])     a. Echo 5/16:  mild LVH, EF 30-35%, diff HK, AVR ok, mild MR, severe LAE, mod RAE, reduced RVSF.  Repeat limited echo 10/2014 with EF 25-30%  . History of PFTs     a. Amiodarone started 5/16 >> PFTs w/ DLCO 6/16:  FEV1 72% predicted, FEV1/FVC 66%, DLCO 66% >> minimal reversible obstructive airways disease with mild diffusion defect (suggestive of emphysema but absence of hyperinflation inconsistent with dx)  . AICD (automatic cardioverter/defibrillator) present   . Asthma     "a touch"  . Diabetes ([place])     "I'm prediabetic" (04/22/2015)  . GERD (gastroesophageal reflux disease)   . History of hiatal hernia   . Hepatitis 1957    "don't know what kind:  . Arthritis     "minor everywhere" (04/22/2015)  . CKD (chronic kidney disease)   . Pneumonia 08/2014    "dr thought I may have had a touch"   Past Surgical History  Procedure Laterality Date  . Coronary artery bypass graft  03/10/2008    x 3 Dr. Roxan Hockey  . Aortic valve replacement  with 23-mm Magna Ease pericardial valve, model number    with 23-mm Magna Ease pericardial valve,  model [id number], serial number [id number]   . Cataract extraction w/ intraocular lens  implant, bilateral Bilateral   . Cardioversion N/A 01/27/2013    Procedure: CARDIOVERSION;  Surgeon: Sueanne Margarita, MD;  Location: [facility];  Service: Cardiovascular;  Laterality: N/A;  . Cardioversion N/A 08/25/2014    Procedure: CARDIOVERSION;  Surgeon: Sanda Klein, MD;  Location: [facility];  Service: Cardiovascular;  Laterality: N/A;  . Cardiac catheterization N/A 11/04/2014    Procedure: Left Heart Cath and Cors/Grafts Angiography;  Surgeon: Leonie Man, MD;  Location: [facility];  Service: Cardiovascular;  Laterality: N/A;  .  Bi-ventricular implantable cardioverter defibrillator  (crt-d)  04/22/2015  . Tonsillectomy    . Appendectomy  1940s  . Cardiac valve replacement    . Esophagogastroduodenoscopy (egd) with esophageal dilation  X1  . Ep implantable device N/A 04/22/2015    Procedure: BiV ICD Insertion CRT-D;  Surgeon: Kari Kerth Meredith Leeds, MD;  Location: [facility];  Service: Cardiovascular;  Laterality: N/A;     Current Outpatient Prescriptions  Medication Sig Dispense Refill  . acetaminophen (TYLENOL) 500 MG tablet Take 500 mg by mouth every 6 (six) hours as needed for moderate pain.    Marland Kitchen amiodarone (PACERONE) 200 MG tablet Take 1 tablet (200 mg total) by mouth daily. 90 tablet 3  . apixaban (ELIQUIS) 2.5 MG TABS tablet Take 1 tablet (2.5 mg total) by mouth 2 (two) times daily. 180 tablet 3  . atorvastatin (LIPITOR) 20 MG tablet Take 20 mg by mouth daily at 6 PM.     . carvedilol (COREG) 6.25 MG tablet Take 1 tablet (6.25 mg total) by mouth 2 (two) times daily with a meal. 180 tablet 3  . furosemide (LASIX) 20 MG tablet Take 1 tablet (20 mg total) by mouth daily. 30 tablet 0  . hydrALAZINE (APRESOLINE) 25 MG tablet Take 0.5 tablets (12.5 mg total) by mouth 2 (two) times daily before a meal. 270 tablet 3  . isosorbide mononitrate (IMDUR) 30 MG 24 hr tablet Take 1 tablet (30 mg total) by mouth daily. 90 tablet 3  . Magnesium 250 MG TABS Take 1 tablet by mouth every evening.     . Multiple Vitamin (MULTIVITAMIN) capsule Take 1 capsule by mouth daily.    Marland Kitchen omega-3 acid ethyl esters (LOVAZA) 1 G capsule Take 2 g by mouth 2 (two) times daily.    . ranitidine (ZANTAC) 300 MG tablet Take 300 mg by mouth at bedtime.     No current facility-administered medications for this visit.    Allergies:   Novocain   Social History:  The patient  reports that he has never smoked. He has never used smokeless tobacco. He reports that he drinks alcohol. He reports that he does not use illicit drugs.   Family History:   The patient's family history includes Alzheimer's disease in his mother; COPD in his daughter and sister; Cancer in his sister; Depression in his sister; Heart disease in his sister; Hyperlipidemia in his sister; Hypertension in his sister.    ROS:  Please see the history of present illness.   Otherwise, review of systems is positive for appetite change, easy bruising.   All other systems are reviewed and negative.    PHYSICAL EXAM: VS:  BP 140/80 mmHg  Pulse 60  Ht 6' (1.829 m)  Wt 203 lb (92.08 kg)  BMI 27.53 kg/m2 , BMI Body mass index is 27.53 kg/(m^2). GEN: Well nourished, well developed, in no acute distress  HEENT: normal Neck: no JVD, carotid bruits, or masses Cardiac: RRR; no murmurs, rubs, or gallops,no edema  Respiratory:  clear to auscultation bilaterally, normal work of breathing GI: soft, nontender, nondistended, + BS MS: no deformity or atrophy Skin: warm and dry, device site C/D/I Neuro:  Strength and sensation are intact Psych: euthymic mood, full affect  EKG:  EKG is ordered today. The ekg ordered today shows sinus rhythm, V paced  Device interrogation performed today in clinic.  See PaceArt for details.   Recent Labs: 08/23/2014: Magnesium 2.2 01/19/2015: TSH 1.654 06/16/2015: ALT 44; B Natriuretic Peptide 1510.4* 06/17/2015: Hemoglobin 13.7; Platelets 241 06/19/2015: BUN 44*; Creatinine, Ser 1.75*; Potassium 4.1; Sodium 137    Lipid Panel     Component Value Date/Time   CHOL 123* 01/19/2015 1005   CHOL 93* 06/16/2014 0914   TRIG 161* 01/19/2015 1005   TRIG 124 06/16/2014 0914   HDL 45 01/19/2015 1005   HDL 34* 06/16/2014 0914   CHOLHDL 2.7 01/19/2015 1005   VLDL 32* 01/19/2015 1005   LDLCALC 46 01/19/2015 1005   LDLCALC 34 06/16/2014 0914     Wt Readings from Last 3 Encounters:  07/28/15 203 lb (92.08 kg)  06/30/15 194 lb (87.998 kg)  06/19/15 196 lb 6.4 oz (89.086 kg)      Other studies Reviewed: Additional studies/ records that were reviewed  today include: TTE 02/02/15  Review of the above records today demonstrates:  - Left ventricle: The cavity size was mildly dilated. Systolic function was severely reduced. The estimated ejection fraction was in the range of 25% to 30%. There is akinesis of the basal-midinferoseptal myocardium. There is akinesis of the entireapical myocardium. There is akinesis of the mid-apicallateral myocardium. There is akinesis of the mid-apicalinferior myocardium. There is severe hypokinesis of the entireinferolateral myocardium. There is akinesis of the apicalanterior myocardium. There is akinesis of the midanteroseptal myocardium. Features are consistent with a pseudonormal left ventricular filling pattern, with concomitant abnormal relaxation and increased filling pressure (grade 2 diastolic dysfunction). Doppler parameters are consistent with high ventricular filling pressure. - Aortic valve: Poorly visualized. A bioprosthesis was present and functioning normally. - Mitral valve: Calcified annulus. Mild diffuse calcification of the anterior leaflet. There was mild regurgitation. - Left atrium: The atrium was severely dilated. - Tricuspid valve: There was trivial regurgitation.   ASSESSMENT AND PLAN:  1.  Chronic combined systolic and diastolic congestive heart failure: Has been on Imdur hydralazine and Lasix and beta blocker for the last 3 months. He is not able to tolerate Ace inhibitors due to his CKD. His repeat echo did show an EF of 25-30%. He had a CRT placed on 04/22/15.  He is doing much better now that his CRT is placed. He is by V paced 97% of the time. He is also being atrially paced 59% of the time and therefore we have turned rate response on.  2. Paroxysmal atrial fibrillation: The patient is currently on amiodarone, beta blockers and eliquis for his atrial fibrillation. He has been well controlled on this regimen we Jamill Wetmore make no changes today. He has a  chads 2 score of 5.  This patients CHA2DS2-VASc Score and unadjusted Ischemic Stroke Rate (% per year) is equal to 7.2 % stroke rate/year from a score of 5  Above score calculated as 1 point each if present [CHF, HTN, DM, Vascular=MI/PAD/Aortic Plaque, Age if 65-74, or Male] Above score calculated as 2 points each if present [Age > 75, or Stroke/TIA/TE]  Current medicines  are reviewed at length with the patient today.   The patient does not have concerns regarding his medicines.  The following changes were made today:    Labs/ tests ordered today include:  Orders Placed This Encounter  Procedures  . EKG 12-Lead     Disposition:   FU with Treana Lacour 9 months  Signed, all of Dhairya Corales Meredith Leeds, MD  07/28/2015 10:18 AM     [facility] [address] [place] [address] [phone] (office) [phone] (fax)

## 2015-07-28 ENCOUNTER — Encounter: Payer: Self-pay | Admitting: Cardiology

## 2015-07-28 ENCOUNTER — Ambulatory Visit (INDEPENDENT_AMBULATORY_CARE_PROVIDER_SITE_OTHER): Payer: Commercial Managed Care - HMO | Admitting: Cardiology

## 2015-07-28 VITALS — BP 158/88 | HR 66 | Ht 72.0 in | Wt 203.0 lb

## 2015-07-28 VITALS — BP 140/80 | HR 60 | Ht 72.0 in | Wt 203.0 lb

## 2015-07-28 DIAGNOSIS — I255 Ischemic cardiomyopathy: Secondary | ICD-10-CM

## 2015-07-28 DIAGNOSIS — I10 Essential (primary) hypertension: Secondary | ICD-10-CM | POA: Diagnosis not present

## 2015-07-28 DIAGNOSIS — Z79899 Other long term (current) drug therapy: Secondary | ICD-10-CM | POA: Diagnosis not present

## 2015-07-28 DIAGNOSIS — Z4502 Encounter for adjustment and management of automatic implantable cardiac defibrillator: Secondary | ICD-10-CM

## 2015-07-28 DIAGNOSIS — E785 Hyperlipidemia, unspecified: Secondary | ICD-10-CM

## 2015-07-28 DIAGNOSIS — I25758 Atherosclerosis of native coronary artery of transplanted heart with other forms of angina pectoris: Secondary | ICD-10-CM | POA: Diagnosis not present

## 2015-07-28 DIAGNOSIS — I5042 Chronic combined systolic (congestive) and diastolic (congestive) heart failure: Secondary | ICD-10-CM

## 2015-07-28 DIAGNOSIS — Z95810 Presence of automatic (implantable) cardiac defibrillator: Secondary | ICD-10-CM

## 2015-07-28 DIAGNOSIS — I480 Paroxysmal atrial fibrillation: Secondary | ICD-10-CM

## 2015-07-28 DIAGNOSIS — I350 Nonrheumatic aortic (valve) stenosis: Secondary | ICD-10-CM

## 2015-07-28 LAB — CUP PACEART INCLINIC DEVICE CHECK
Brady Statistic AP VP Percent: 57.6 %
Brady Statistic AS VP Percent: 40.5 %
Brady Statistic AS VS Percent: 0.95 %
Brady Statistic RV Percent Paced: 97.26 %
Date Time Interrogation Session: 20170420112427
HighPow Impedance: 65 Ohm
Implantable Lead Location: 753859
Lead Channel Impedance Value: 342 Ohm
Lead Channel Impedance Value: 399 Ohm
Lead Channel Impedance Value: 494 Ohm
Lead Channel Impedance Value: 646 Ohm
Lead Channel Pacing Threshold Amplitude: 0.5 V
Lead Channel Pacing Threshold Amplitude: 0.5 V
Lead Channel Pacing Threshold Pulse Width: 0.4 ms
Lead Channel Setting Pacing Amplitude: 2 V
Lead Channel Setting Pacing Pulse Width: 0.4 ms
Lead Channel Setting Sensing Sensitivity: 0.3 mV
MDC IDC LEAD IMPLANT DT: 20170113
MDC IDC LEAD IMPLANT DT: 20170113
MDC IDC LEAD IMPLANT DT: 20170113
MDC IDC LEAD LOCATION: 753858
MDC IDC LEAD LOCATION: 753860
MDC IDC LEAD MODEL: 4298
MDC IDC MSMT BATTERY REMAINING LONGEVITY: 97 mo
MDC IDC MSMT BATTERY VOLTAGE: 3.04 V
MDC IDC MSMT LEADCHNL LV IMPEDANCE VALUE: 323 Ohm
MDC IDC MSMT LEADCHNL LV IMPEDANCE VALUE: 380 Ohm
MDC IDC MSMT LEADCHNL LV IMPEDANCE VALUE: 456 Ohm
MDC IDC MSMT LEADCHNL LV IMPEDANCE VALUE: 570 Ohm
MDC IDC MSMT LEADCHNL LV IMPEDANCE VALUE: 646 Ohm
MDC IDC MSMT LEADCHNL LV IMPEDANCE VALUE: 646 Ohm
MDC IDC MSMT LEADCHNL LV IMPEDANCE VALUE: 665 Ohm
MDC IDC MSMT LEADCHNL LV PACING THRESHOLD PULSEWIDTH: 0.4 ms
MDC IDC MSMT LEADCHNL RA PACING THRESHOLD AMPLITUDE: 0.75 V
MDC IDC MSMT LEADCHNL RA PACING THRESHOLD PULSEWIDTH: 0.4 ms
MDC IDC MSMT LEADCHNL RA SENSING INTR AMPL: 4.75 mV
MDC IDC MSMT LEADCHNL RV IMPEDANCE VALUE: 380 Ohm
MDC IDC MSMT LEADCHNL RV IMPEDANCE VALUE: 456 Ohm
MDC IDC MSMT LEADCHNL RV SENSING INTR AMPL: 13 mV
MDC IDC SET LEADCHNL RA PACING AMPLITUDE: 1.5 V
MDC IDC SET LEADCHNL RV PACING AMPLITUDE: 2 V
MDC IDC SET LEADCHNL RV PACING PULSEWIDTH: 0.4 ms
MDC IDC STAT BRADY AP VS PERCENT: 0.95 %
MDC IDC STAT BRADY RA PERCENT PACED: 58.55 %

## 2015-07-28 LAB — HEPATIC FUNCTION PANEL
ALBUMIN: 4.1 g/dL (ref 3.6–5.1)
ALK PHOS: 44 U/L (ref 40–115)
ALT: 35 U/L (ref 9–46)
AST: 30 U/L (ref 10–35)
BILIRUBIN INDIRECT: 0.7 mg/dL (ref 0.2–1.2)
Bilirubin, Direct: 0.2 mg/dL (ref <=0.2)
TOTAL PROTEIN: 6.5 g/dL (ref 6.1–8.1)
Total Bilirubin: 0.9 mg/dL (ref 0.2–1.2)

## 2015-07-28 LAB — TSH: TSH: 1.27 mIU/L (ref 0.40–4.50)

## 2015-07-28 NOTE — Progress Notes (Signed)
Cardiology Office Note    Date:  07/28/2015   ID:  [name], DOB [birth year], MRN [id number]  PCP:  [name], MD  Cardiologist:  Sueanne Margarita, MD   Chief Complaint  Patient presents with  . Coronary Artery Disease  . Hypertension  . Atrial Fibrillation    History of Present Illness:  [name] is a 82 y.o. male with a history of HTN, AS s/p AVR, chronic combined systolic/diastolic CHF in setting of Afib with RVR, orthostatic hypotension, Chronic combined systolic/diastolic CHF, DCM EF 44-62%, ASCAD s/p CABG and PAF. He now presents back for followup. He denies anypalpitations, dizziness or syncope. He denies any chest pain, LE edema.He has some DOE with extreme exertion. He denies any PND or orthopnea.He is able to get out and work in his garden with no problems He has not had any further weak spells.     Past Medical History  Diagnosis Date  . Heart murmur   . Hypercholesteremia   . HTN (hypertension)   . Diastolic CHF ([place])     2D echo 12/2010 - nl LVF, mild LAE, MAC with moderately thickened MV leaflets and trivial MR, normal functioning prosthetic AVR with mild increased gradient, grade II diastolic dysfunction   . Hypotension   . Orthostatic hypotension   . Aortic valve disorder 2009    severe AS s/p AVR with pericardial tissue valve  . Aortic stenosis   . Barrett esophagus   . CAD (coronary artery disease)     s/p 2 vessel CABG with LIMA to LAD, SVG to diagonal 1, SVG to RCA 12/09  . Cardiomyopathy ([place])   . H/O: rheumatic fever   . Atrial fibrillation ([place])     post op afib with no reoccurence  . Cardiomyopathy ([place])     a. Echo 5/16:  mild LVH, EF 30-35%, diff HK, AVR ok, mild MR, severe LAE, mod RAE, reduced RVSF.  Repeat limited echo 10/2014 with EF 25-30%  . History of PFTs     a. Amiodarone started 5/16 >> PFTs w/ DLCO 6/16:  FEV1 72% predicted, FEV1/FVC 66%, DLCO 66% >> minimal reversible obstructive airways disease with mild diffusion  defect (suggestive of emphysema but absence of hyperinflation inconsistent with dx)  . AICD (automatic cardioverter/defibrillator) present   . Asthma     "a touch"  . Diabetes ([place])     "I'm prediabetic" (04/22/2015)  . GERD (gastroesophageal reflux disease)   . History of hiatal hernia   . Hepatitis 1957    "don't know what kind:  . Arthritis     "minor everywhere" (04/22/2015)  . CKD (chronic kidney disease)   . Pneumonia 08/2014    "dr thought I may have had a touch"    Past Surgical History  Procedure Laterality Date  . Coronary artery bypass graft  03/10/2008    x 3 Dr. Roxan Hockey  . Aortic valve replacement  with 23-mm Magna Ease pericardial valve, model number    with 23-mm Magna Ease pericardial valve, model [id number], serial number [id number]   . Cataract extraction w/ intraocular lens  implant, bilateral Bilateral   . Cardioversion N/A 01/27/2013    Procedure: CARDIOVERSION;  Surgeon: Sueanne Margarita, MD;  Location: [facility];  Service: Cardiovascular;  Laterality: N/A;  . Cardioversion N/A 08/25/2014    Procedure: CARDIOVERSION;  Surgeon: Sanda Klein, MD;  Location: [facility];  Service: Cardiovascular;  Laterality: N/A;  . Cardiac catheterization N/A 11/04/2014    Procedure: Left Heart  Cath and Cors/Grafts Angiography;  Surgeon: Leonie Man, MD;  Location: [facility];  Service: Cardiovascular;  Laterality: N/A;  . Bi-ventricular implantable cardioverter defibrillator  (crt-d)  04/22/2015  . Tonsillectomy    . Appendectomy  1940s  . Cardiac valve replacement    . Esophagogastroduodenoscopy (egd) with esophageal dilation  X1  . Ep implantable device N/A 04/22/2015    Procedure: BiV ICD Insertion CRT-D;  Surgeon: [name], MD;  Location: [facility];  Service: Cardiovascular;  Laterality: N/A;    Current Medications: Outpatient Prescriptions Prior to Visit  Medication Sig Dispense Refill  . acetaminophen (TYLENOL) 500 MG tablet Take  500 mg by mouth every 6 (six) hours as needed for moderate pain.    Marland Kitchen amiodarone (PACERONE) 200 MG tablet Take 1 tablet (200 mg total) by mouth daily. 90 tablet 3  . apixaban (ELIQUIS) 2.5 MG TABS tablet Take 1 tablet (2.5 mg total) by mouth 2 (two) times daily. 180 tablet 3  . atorvastatin (LIPITOR) 20 MG tablet Take 20 mg by mouth daily at 6 PM.     . carvedilol (COREG) 6.25 MG tablet Take 1 tablet (6.25 mg total) by mouth 2 (two) times daily with a meal. 180 tablet 3  . furosemide (LASIX) 20 MG tablet Take 1 tablet (20 mg total) by mouth daily. 30 tablet 0  . hydrALAZINE (APRESOLINE) 25 MG tablet Take 0.5 tablets (12.5 mg total) by mouth 2 (two) times daily before a meal. 270 tablet 3  . isosorbide mononitrate (IMDUR) 30 MG 24 hr tablet Take 1 tablet (30 mg total) by mouth daily. 90 tablet 3  . Magnesium 250 MG TABS Take 1 tablet by mouth every evening.     . Multiple Vitamin (MULTIVITAMIN) capsule Take 1 capsule by mouth daily.    Marland Kitchen omega-3 acid ethyl esters (LOVAZA) 1 G capsule Take 2 g by mouth 2 (two) times daily.    . ranitidine (ZANTAC) 300 MG tablet Take 300 mg by mouth at bedtime.     No facility-administered medications prior to visit.     Allergies:   Novocain   Social History   Social History  . Marital Status: Married    Spouse Name: N/A  . Number of Children: N/A  . Years of Education: N/A   Social History Main Topics  . Smoking status: Never Smoker   . Smokeless tobacco: Never Used  . Alcohol Use: Yes     Comment: 04/22/2015 "nothing since the 1980s; never had a problem w/it"  . Drug Use: No  . Sexual Activity: No   Other Topics Concern  . None   Social History Narrative     Family History:  The patient'sfamily history includes Alzheimer's disease in his mother; COPD in his daughter and sister; Cancer in his sister; Depression in his sister; Heart disease in his sister; Hyperlipidemia in his sister; Hypertension in his sister.   ROS:   Please see the history  of present illness.    Review of Systems  Constitution: Negative.  HENT: Negative.   Eyes: Negative.   Cardiovascular: Negative.   Respiratory: Negative.   Skin: Negative.   Musculoskeletal: Negative.   Gastrointestinal: Negative.   Genitourinary: Negative.   Neurological: Negative.   Psychiatric/Behavioral: Negative.    All other systems reviewed and are negative.   PHYSICAL EXAM:   VS:  BP 158/88 mmHg  Pulse 66  Ht 6' (1.829 m)  Wt 203 lb (92.08 kg)  BMI 27.53  kg/m2   GEN: Well nourished, well developed, in no acute distress HEENT: normal Neck: no JVD, carotid bruits, or masses Cardiac: RRR; no murmurs, rubs, or gallops,no edema.  Intact distal pulses bilaterally.  Respiratory:  clear to auscultation bilaterally, normal work of breathing GI: soft, nontender, nondistended, + BS MS: no deformity or atrophy Skin: warm and dry, no rash Neuro:  Alert and Oriented x 3, Strength and sensation are intact Psych: euthymic mood, full affect  Wt Readings from Last 3 Encounters:  07/28/15 203 lb (92.08 kg)  07/28/15 203 lb (92.08 kg)  06/30/15 194 lb (87.998 kg)      Studies/Labs Reviewed:   EKG:  EKG is  ordered today and showed AV paced rhythm with PVC  Recent Labs: 08/23/2014: Magnesium 2.2 01/19/2015: TSH 1.654 06/16/2015: ALT 44; B Natriuretic Peptide 1510.4* 06/17/2015: Hemoglobin 13.7; Platelets 241 06/19/2015: BUN 44*; Creatinine, Ser 1.75*; Potassium 4.1; Sodium 137   Lipid Panel    Component Value Date/Time   CHOL 123* 01/19/2015 1005   CHOL 93* 06/16/2014 0914   TRIG 161* 01/19/2015 1005   TRIG 124 06/16/2014 0914   HDL 45 01/19/2015 1005   HDL 34* 06/16/2014 0914   CHOLHDL 2.7 01/19/2015 1005   VLDL 32* 01/19/2015 1005   LDLCALC 46 01/19/2015 1005   LDLCALC 34 06/16/2014 0914    Additional studies/ records that were reviewed today include:  none    ASSESSMENT:    1. CAD status post CABG   2. Chronic combined systolic and diastolic heart failure  (HCC)   3. Cardiomyopathy, ischemic: EF roughly 30%   4. Essential hypertension   5. Aortic stenosis, severe   6. Paroxysmal atrial fibrillation (HCC)   7. Dyslipidemia      PLAN:  In order of problems listed above:      Medication Adjustments/Labs and Tests Ordered: Current medicines are reviewed at length with the patient today.  Concerns regarding medicines are outlined above.  Medication changes, Labs and Tests ordered today are listed in the Patient Instructions below. There are no Patient Instructions on file for this visit.   Lurena Nida, MD  07/28/2015 10:56 AM    [facility] [place], [place], [place]  [address] Phone: [phone]; Fax: [phone]

## 2015-07-28 NOTE — Patient Instructions (Addendum)
Medication Instructions:  Your physician recommends that you continue on your current medications as directed. Please refer to the Current Medication list given to you today.  Labwork: Medication surveillance labs today: TSH, LFT  Testing/Procedures: None ordered  Follow-Up: Remote monitoring is used to monitor your Pacemaker of ICD from home. This monitoring reduces the number of office visits required to check your device to one time per year. It allows [place] to keep an eye on the functioning of your device to ensure it is working properly. You are scheduled for a device check from home on 10/27/2015. You may send your transmission at any time that day. If you have a wireless device, the transmission will be sent automatically. After your physician reviews your transmission, you will receive a postcard with your next transmission date.  Your physician wants you to follow-up in: 9 months with Dr. Curt Bears. You will receive a reminder letter in the mail two months in advance. If you don't receive a letter, please call our office to schedule the follow-up appointment.   If you need a refill on your cardiac medications before your next appointment, please call your pharmacy.  Thank you for choosing [facility]!!   Trinidad Curet, RN [phone]

## 2015-07-28 NOTE — Patient Instructions (Signed)
Medication Instructions:  Your physician recommends that you continue on your current medications as directed. Please refer to the Current Medication list given to you today.   Labwork: None  Testing/Procedures: None  Follow-Up: Your physician wants you to follow-up in: 6 months with Dr. Radford Pax. You will receive a reminder letter in the mail two months in advance. If you don't receive a letter, please call our office to schedule the follow-up appointment.   Any Other Special Instructions Will Be Listed Below (If Applicable).     If you need a refill on your cardiac medications before your next appointment, please call your pharmacy.

## 2015-08-04 ENCOUNTER — Other Ambulatory Visit: Payer: Self-pay | Admitting: *Deleted

## 2015-08-04 ENCOUNTER — Encounter: Payer: Self-pay | Admitting: *Deleted

## 2015-08-04 NOTE — Patient Outreach (Signed)
[facility]) Care Management   08/04/2015  DOUGLASS DUNSHEE [birth year] [id number]  [name] is an 82 y.o. male  Subjective: Pt is doing very well. He is following his CHF management daily regimen. No present sxs. No new problems.  Objective:   Review of Systems  Constitutional: Negative.   HENT: Negative.   Eyes: Negative.   Respiratory: Negative.   Cardiovascular: Negative.   Gastrointestinal: Negative.   Genitourinary: Negative.   Musculoskeletal: Negative.   Skin: Negative.   Neurological: Negative.   Endo/Heme/Allergies: Negative.   Psychiatric/Behavioral: Negative.    Pulse 62  Resp 16  Wt 197 lb (89.359 kg)  SpO2 97%  Physical Exam  Constitutional: He is oriented to person, place, and time. He appears well-developed and well-nourished.  HENT:  Head: Normocephalic and atraumatic.  Cardiovascular: Normal rate, regular rhythm and normal heart sounds.   Respiratory: Effort normal and breath sounds normal.  GI: Soft. Bowel sounds are normal.  Musculoskeletal: Normal range of motion.  Neurological: He is alert and oriented to person, place, and time.  Skin: Skin is warm and dry.  Psychiatric: He has a normal mood and affect. His behavior is normal. Judgment and thought content normal.    Encounter Medications:   Outpatient Encounter Prescriptions as of 08/04/2015  Medication Sig  . acetaminophen (TYLENOL) 500 MG tablet Take 500 mg by mouth every 6 (six) hours as needed for moderate pain.  Marland Kitchen amiodarone (PACERONE) 200 MG tablet Take 1 tablet (200 mg total) by mouth daily.  Marland Kitchen apixaban (ELIQUIS) 2.5 MG TABS tablet Take 1 tablet (2.5 mg total) by mouth 2 (two) times daily.  Marland Kitchen atorvastatin (LIPITOR) 20 MG tablet Take 20 mg by mouth daily at 6 PM.   . carvedilol (COREG) 6.25 MG tablet Take 1 tablet (6.25 mg total) by mouth 2 (two) times daily with a meal.  . furosemide (LASIX) 20 MG tablet Take 1 tablet (20 mg total) by mouth daily.  . hydrALAZINE  (APRESOLINE) 25 MG tablet Take 0.5 tablets (12.5 mg total) by mouth 2 (two) times daily before a meal.  . isosorbide mononitrate (IMDUR) 30 MG 24 hr tablet Take 1 tablet (30 mg total) by mouth daily.  . Magnesium 250 MG TABS Take 1 tablet by mouth every evening.   . Multiple Vitamin (MULTIVITAMIN) capsule Take 1 capsule by mouth daily.  Marland Kitchen omega-3 acid ethyl esters (LOVAZA) 1 G capsule Take 2 g by mouth 2 (two) times daily.  . ranitidine (ZANTAC) 300 MG tablet Take 300 mg by mouth at bedtime.   No facility-administered encounter medications on file as of 08/04/2015.    Functional Status:   In your present state of health, do you have any difficulty performing the following activities: 06/16/2015 05/06/2015  Hearing? [place]? N -  Difficulty concentrating or making decisions? N -  Walking or climbing stairs? N -  Dressing or bathing? N -  Doing errands, shopping? N -  Conservation officer, nature and eating ? - N  Using the Toilet? - N  In the past six months, have you accidently leaked urine? - N  Do you have problems with loss of bowel control? - N  Managing your Medications? - N  Managing your Finances? - N  Housekeeping or managing your Housekeeping? - N    Fall/Depression Screening:    PHQ 2/9 Scores 05/09/2015  PHQ - 2 Score 0    Assessment:  CHF well managed  Plan:  Today, is graduation  day! [name] is adhering to his self management regimen. He knows he can call me if he has problems in the future.  [facility] CM Care Plan Problem One        Most Recent Value   Care Plan Problem One  CHF   Role Documenting the Problem One  Care Management Coordinator   Care Plan for Problem One  Active   THN Long Term Goal (31-90 days)  Pt will not be hospitalized within the next 90 days.   THN Long Term Goal Start Date  06/28/15   THN Long Term Goal Met Date  08/05/15   Interventions for Problem One Long Term Goal  Pt is stable and has not been in the hospital since mid March.   [facility]N CM Short Term  Goal #1 (0-30 days)  Pt will not readmit for HF in the next 30 days.   THN CM Short Term Goal #1 Start Date  06/28/15   [facility] CM Short Term Goal #1 Met Date  08/04/15    [facility] CM Care Plan Problem Two        Most Recent Value   Care Plan Problem Two  Difficulty emptying bladder.   Role Documenting the Problem Two  Care Management Coordinator   Care Plan for Problem Two  Not Active   THN CM Short Term Goal #1 (0-30 days)  Pt to schedule appt with new urologist by my next visit 06/30/15.   THN CM Short Term Goal #1 Start Date  06/03/15   Interventions for Short Term Goal #2   Pt still has not done this. He will see his new primary soon and get a referral.    THN CM Care Plan Problem Three        Most Recent Value   Care Plan Problem Three  Vulnerable to community acquired viruses.   Role Documenting the Problem Three  Care Management Coordinator   Care Plan for Problem Three  Active   THN CM Short Term Goal #1 (0-30 days)  Pt will wear mask when in crowded public areas for the next 30 days.   THN CM Short Term Goal #1 Start Date  06/30/15   [facility] CM Short Term Goal #1 Met Date  08/04/15     Deloria Lair [facility] [place] [phone]

## 2015-08-05 ENCOUNTER — Encounter: Payer: Self-pay | Admitting: *Deleted

## 2015-08-17 DIAGNOSIS — N183 Chronic kidney disease, stage 3 (moderate): Secondary | ICD-10-CM | POA: Diagnosis not present

## 2015-08-17 DIAGNOSIS — Z23 Encounter for immunization: Secondary | ICD-10-CM | POA: Diagnosis not present

## 2015-08-17 DIAGNOSIS — R7303 Prediabetes: Secondary | ICD-10-CM | POA: Diagnosis not present

## 2015-08-17 DIAGNOSIS — E782 Mixed hyperlipidemia: Secondary | ICD-10-CM | POA: Diagnosis not present

## 2015-08-17 DIAGNOSIS — R7871 Abnormal lead level in blood: Secondary | ICD-10-CM | POA: Diagnosis not present

## 2015-08-17 DIAGNOSIS — K219 Gastro-esophageal reflux disease without esophagitis: Secondary | ICD-10-CM | POA: Diagnosis not present

## 2015-08-17 DIAGNOSIS — I509 Heart failure, unspecified: Secondary | ICD-10-CM | POA: Diagnosis not present

## 2015-08-17 DIAGNOSIS — M79604 Pain in right leg: Secondary | ICD-10-CM | POA: Diagnosis not present

## 2015-09-02 DIAGNOSIS — L03116 Cellulitis of left lower limb: Secondary | ICD-10-CM | POA: Diagnosis not present

## 2015-09-06 DIAGNOSIS — L03116 Cellulitis of left lower limb: Secondary | ICD-10-CM | POA: Diagnosis not present

## 2015-09-19 ENCOUNTER — Other Ambulatory Visit: Payer: Self-pay | Admitting: *Deleted

## 2015-09-19 DIAGNOSIS — I2583 Coronary atherosclerosis due to lipid rich plaque: Principal | ICD-10-CM

## 2015-09-19 DIAGNOSIS — I5042 Chronic combined systolic (congestive) and diastolic (congestive) heart failure: Secondary | ICD-10-CM

## 2015-09-19 DIAGNOSIS — I2510 Atherosclerotic heart disease of native coronary artery without angina pectoris: Secondary | ICD-10-CM

## 2015-09-19 DIAGNOSIS — I10 Essential (primary) hypertension: Secondary | ICD-10-CM

## 2015-09-19 DIAGNOSIS — I350 Nonrheumatic aortic (valve) stenosis: Secondary | ICD-10-CM

## 2015-09-19 DIAGNOSIS — I480 Paroxysmal atrial fibrillation: Secondary | ICD-10-CM

## 2015-09-19 DIAGNOSIS — E785 Hyperlipidemia, unspecified: Secondary | ICD-10-CM

## 2015-09-19 MED ORDER — ISOSORBIDE MONONITRATE ER 30 MG PO TB24: 30.0000 mg | ORAL_TABLET | Freq: Every day | ORAL | Status: DC

## 2015-09-19 MED ORDER — APIXABAN 2.5 MG PO TABS: 2.5000 mg | ORAL_TABLET | Freq: Two times a day (BID) | ORAL | Status: DC

## 2015-09-19 MED ORDER — FUROSEMIDE 20 MG PO TABS: 20.0000 mg | ORAL_TABLET | Freq: Every day | ORAL | Status: DC

## 2015-09-19 MED ORDER — AMIODARONE HCL 200 MG PO TABS: 200.0000 mg | ORAL_TABLET | Freq: Every day | ORAL | Status: DC

## 2015-09-19 MED ORDER — ATORVASTATIN CALCIUM 20 MG PO TABS: 20.0000 mg | ORAL_TABLET | Freq: Every day | ORAL | Status: DC

## 2015-09-28 ENCOUNTER — Other Ambulatory Visit: Payer: Self-pay | Admitting: *Deleted

## 2015-09-28 NOTE — Patient Outreach (Signed)
MD referral to see pt for foot care and education s/p episode of cellulitis of L foot and ankle.  Foot assessment: Normal shape foot and toes, normal color - no redness at present. Pt has dried blood on bilateral great toes distally. He says he trimmed his own toenails and clipped the skin. Some of his nails are onychomycotic. He has some very superficial plantar calluses. 2+ pulses: pedal, posterior tibial and popliteal. No edmea. Monofiliment testing reveals poor sensation: L foot only appreciated 3/10 touches and R foot 5/10.  Trimmed all toenails.  Emmi programs watched and read: Foot care and Peripheral neuropathy  Advised pt to wash feet daily and inspect them. Explained importance of this with decreased sensation. Pt states he understands.  Advised to wear white socks so if he has an insult to the skin bleeding may be easily identified. Also absorbs moisture and protects feet from rubbing, reduces chance of increased fungal infection. Pt states he understands.  This is a one time visit. Pt does know if he has future needs he can call me.  Deloria Lair [facility] [place] [phone]

## 2015-10-27 ENCOUNTER — Telehealth: Payer: Self-pay | Admitting: Cardiology

## 2015-10-27 ENCOUNTER — Ambulatory Visit (INDEPENDENT_AMBULATORY_CARE_PROVIDER_SITE_OTHER): Payer: Commercial Managed Care - HMO | Admitting: *Deleted

## 2015-10-27 DIAGNOSIS — I5042 Chronic combined systolic (congestive) and diastolic (congestive) heart failure: Secondary | ICD-10-CM

## 2015-10-27 DIAGNOSIS — I255 Ischemic cardiomyopathy: Secondary | ICD-10-CM | POA: Diagnosis not present

## 2015-10-27 DIAGNOSIS — Z95810 Presence of automatic (implantable) cardiac defibrillator: Secondary | ICD-10-CM

## 2015-10-27 NOTE — Progress Notes (Signed)
Remote ICD transmission.   

## 2015-10-27 NOTE — Telephone Encounter (Signed)
Confirmed remote transmission w/ pt wife.

## 2015-10-28 ENCOUNTER — Encounter: Payer: Self-pay | Admitting: Cardiology

## 2015-11-08 LAB — CUP PACEART REMOTE DEVICE CHECK
Battery Remaining Longevity: 95 mo
Battery Voltage: 3.02 V
Brady Statistic AS VS Percent: 0.27 %
HIGH POWER IMPEDANCE MEASURED VALUE: 69 Ohm
Implantable Lead Implant Date: 20170113
Implantable Lead Location: 753858
Implantable Lead Location: 753860
Implantable Lead Model: 4298
Lead Channel Impedance Value: 399 Ohm
Lead Channel Impedance Value: 456 Ohm
Lead Channel Impedance Value: 456 Ohm
Lead Channel Impedance Value: 646 Ohm
Lead Channel Impedance Value: 703 Ohm
Lead Channel Impedance Value: 722 Ohm
Lead Channel Impedance Value: 722 Ohm
Lead Channel Pacing Threshold Amplitude: 0.5 V
Lead Channel Pacing Threshold Amplitude: 0.75 V
Lead Channel Pacing Threshold Pulse Width: 0.4 ms
Lead Channel Pacing Threshold Pulse Width: 0.4 ms
Lead Channel Sensing Intrinsic Amplitude: 3 mV
Lead Channel Setting Pacing Amplitude: 2 V
Lead Channel Setting Pacing Pulse Width: 0.4 ms
Lead Channel Setting Sensing Sensitivity: 0.3 mV
MDC IDC LEAD IMPLANT DT: 20170113
MDC IDC LEAD IMPLANT DT: 20170113
MDC IDC LEAD LOCATION: 753859
MDC IDC MSMT LEADCHNL LV IMPEDANCE VALUE: 380 Ohm
MDC IDC MSMT LEADCHNL LV IMPEDANCE VALUE: 380 Ohm
MDC IDC MSMT LEADCHNL LV IMPEDANCE VALUE: 399 Ohm
MDC IDC MSMT LEADCHNL LV IMPEDANCE VALUE: 437 Ohm
MDC IDC MSMT LEADCHNL LV IMPEDANCE VALUE: 646 Ohm
MDC IDC MSMT LEADCHNL RA IMPEDANCE VALUE: 456 Ohm
MDC IDC MSMT LEADCHNL RA SENSING INTR AMPL: 3 mV
MDC IDC MSMT LEADCHNL RV PACING THRESHOLD AMPLITUDE: 0.5 V
MDC IDC MSMT LEADCHNL RV PACING THRESHOLD PULSEWIDTH: 0.4 ms
MDC IDC MSMT LEADCHNL RV SENSING INTR AMPL: 11.875 mV
MDC IDC MSMT LEADCHNL RV SENSING INTR AMPL: 11.875 mV
MDC IDC SESS DTM: 20170720192337
MDC IDC SET LEADCHNL LV PACING AMPLITUDE: 1.5 V
MDC IDC SET LEADCHNL LV PACING PULSEWIDTH: 0.4 ms
MDC IDC SET LEADCHNL RA PACING AMPLITUDE: 1.5 V
MDC IDC STAT BRADY AP VP PERCENT: 89.55 %
MDC IDC STAT BRADY AP VS PERCENT: 0.76 %
MDC IDC STAT BRADY AS VP PERCENT: 9.42 %
MDC IDC STAT BRADY RA PERCENT PACED: 90.31 %
MDC IDC STAT BRADY RV PERCENT PACED: 97.6 %

## 2015-11-22 ENCOUNTER — Telehealth: Payer: Self-pay | Admitting: Cardiology

## 2015-11-22 ENCOUNTER — Other Ambulatory Visit: Payer: Self-pay | Admitting: Cardiology

## 2015-11-22 MED ORDER — CARVEDILOL 6.25 MG PO TABS: 6.2500 mg | ORAL_TABLET | Freq: Two times a day (BID) | ORAL | 3 refills | Status: DC

## 2015-11-22 NOTE — Telephone Encounter (Signed)
Spoke with patient and let him know that he has refills on file with [facility] for these medications. He will call them to try to reorder and will call us back if he has any problems.

## 2015-11-22 NOTE — Telephone Encounter (Signed)
*  STAT* If patient is at the pharmacy, call can be transferred to refill team.   1. Which medications need to be refilled? (please list name of each medication and dose if known) Carvedilol 6.2m  and Amiodarone 2052mneeds new prescriptions sent   2. Which pharmacy/location (including street and city if local pharmacy) is medication to be sent to?[facility]   3. Do they need a 30 day or 90 day supply? [address]

## 2015-12-22 DIAGNOSIS — Z23 Encounter for immunization: Secondary | ICD-10-CM | POA: Diagnosis not present

## 2015-12-27 ENCOUNTER — Other Ambulatory Visit: Payer: Self-pay | Admitting: *Deleted

## 2015-12-27 NOTE — Patient Outreach (Signed)
[facility] Screening. [name] is well known to me. He had 2 hospitalizations for HF earlier in the year and that was the last time he was in the hospital (March 2017). He reports he is doing fairly well. He had his flu shot last week. He reports he has had some trouble with low blood pressure for awhile (90-100/50-60) and he had been symptomatic but this has resolved and his latest reading was 699 systolic. He has an appt with his cardioligist 01/25/16 and he will be discussing this with her.  I have reminded him that if he needs me, I am available to him and I gave him my number again for his convenience. He expressed his appreciation for the call.  Pt has no case management needs at this time.  Deloria Lair [facility] [place] [phone]

## 2016-01-25 ENCOUNTER — Ambulatory Visit (INDEPENDENT_AMBULATORY_CARE_PROVIDER_SITE_OTHER): Payer: Commercial Managed Care - HMO | Admitting: Cardiology

## 2016-01-25 ENCOUNTER — Encounter: Payer: Self-pay | Admitting: Cardiology

## 2016-01-25 VITALS — BP 154/86 | HR 81 | Ht 72.0 in | Wt 205.8 lb

## 2016-01-25 DIAGNOSIS — I350 Nonrheumatic aortic (valve) stenosis: Secondary | ICD-10-CM

## 2016-01-25 DIAGNOSIS — I25758 Atherosclerosis of native coronary artery of transplanted heart with other forms of angina pectoris: Secondary | ICD-10-CM | POA: Diagnosis not present

## 2016-01-25 DIAGNOSIS — I255 Ischemic cardiomyopathy: Secondary | ICD-10-CM

## 2016-01-25 DIAGNOSIS — I5042 Chronic combined systolic (congestive) and diastolic (congestive) heart failure: Secondary | ICD-10-CM | POA: Diagnosis not present

## 2016-01-25 DIAGNOSIS — I10 Essential (primary) hypertension: Secondary | ICD-10-CM

## 2016-01-25 DIAGNOSIS — I481 Persistent atrial fibrillation: Secondary | ICD-10-CM | POA: Diagnosis not present

## 2016-01-25 DIAGNOSIS — E785 Hyperlipidemia, unspecified: Secondary | ICD-10-CM

## 2016-01-25 DIAGNOSIS — I4819 Other persistent atrial fibrillation: Secondary | ICD-10-CM

## 2016-01-25 NOTE — Progress Notes (Signed)
Cardiology Office Note    Date:  01/25/2016   ID:  [name], DOB 1933/01/31, MRN [id number]  PCP:  [name], MD  Cardiologist:  [name], MD   Chief Complaint  Patient presents with  . Coronary Artery Disease  . Hypertension  . Atrial Fibrillation  . Hyperlipidemia    History of Present Illness:  [name] is a 82 y.o. male with a history of HTN, AS s/p AVR, chronic combined systolic/diastolic CHF in setting of Afib with RVR, orthostatic hypotension, Chronic combined systolic/diastolic CHF, DCM EF 25-36%, ASCAD s/p CABG and PAF. He now presents back for followup. He denies anypalpitations, dizziness or syncope. He denies any chest pain, LE edema.He has some DOE with extreme exertion. He denies any PND or orthopnea.He is able to get out and work in his garden with no problems    Past Medical History:  Diagnosis Date  . AICD (automatic cardioverter/defibrillator) present   . Aortic valve disorder 2009   severe AS s/p AVR with pericardial tissue valve  . Arthritis    "minor everywhere" (04/22/2015)  . Asthma    "a touch"  . Barrett esophagus   . CAD (coronary artery disease)    s/p 2 vessel CABG with LIMA to LAD, SVG to diagonal 1, SVG to RCA 12/09  . Cardiomyopathy ([place])    EF 15-20% by echo 2017  . Chronic combined systolic and diastolic CHF (congestive heart failure) (HCC)    dry weight 197-200lbs.  . CKD (chronic kidney disease)   . Diabetes ([place])    "I'm prediabetic" (04/22/2015)  . GERD (gastroesophageal reflux disease)   . H/O: rheumatic fever   . Hepatitis 1957   "don't know what kind:  . History of hiatal hernia   . History of PFTs    a. Amiodarone started 5/16 >> PFTs w/ DLCO 6/16:  FEV1 72% predicted, FEV1/FVC 66%, DLCO 66% >> minimal reversible obstructive airways disease with mild diffusion defect (suggestive of emphysema but absence of hyperinflation inconsistent with dx)  . HTN (hypertension)   . Hypercholesteremia   .  Hypotension   . Orthostatic hypotension   . Persistent atrial fibrillation ([place])    post op afib with no reoccurence  . Pneumonia 08/2014   "dr thought I may have had a touch"    Past Surgical History:  Procedure Laterality Date  . AORTIC VALVE REPLACEMENT  with 23-mm Magna Ease pericardial valve, model number   with 23-mm Magna Ease pericardial valve, model [id number], serial number [id number]   . APPENDECTOMY  1940s  . BI-VENTRICULAR IMPLANTABLE CARDIOVERTER DEFIBRILLATOR  (CRT-D)  04/22/2015  . CARDIAC CATHETERIZATION N/A 11/04/2014   Procedure: Left Heart Cath and Cors/Grafts Angiography;  Surgeon: Leonie Man, MD;  Location: [place] [facility];  Service: Cardiovascular;  Laterality: N/A;  . CARDIAC VALVE REPLACEMENT    . CARDIOVERSION N/A 01/27/2013   Procedure: CARDIOVERSION;  Surgeon: Sueanne Margarita, MD;  Location: [place];  Service: Cardiovascular;  Laterality: N/A;  . CARDIOVERSION N/A 08/25/2014   Procedure: CARDIOVERSION;  Surgeon: Sanda Klein, MD;  Location: [facility];  Service: Cardiovascular;  Laterality: N/A;  . CATARACT EXTRACTION W/ INTRAOCULAR LENS  IMPLANT, BILATERAL Bilateral   . CORONARY ARTERY BYPASS GRAFT  03/10/2008   x 3 Dr. Roxan Hockey  . EP IMPLANTABLE DEVICE N/A 04/22/2015   Procedure: BiV ICD Insertion CRT-D;  Surgeon: [name], MD;  Location: [place] [facility];  Service: Cardiovascular;  Laterality: N/A;  . ESOPHAGOGASTRODUODENOSCOPY (  EGD) WITH ESOPHAGEAL DILATION  X1  . TONSILLECTOMY      Current Medications: Outpatient Medications Prior to Visit  Medication Sig Dispense Refill  . acetaminophen (TYLENOL) 500 MG tablet Take 500 mg by mouth every 6 (six) hours as needed for moderate pain.    Marland Kitchen amiodarone (PACERONE) 200 MG tablet Take 1 tablet (200 mg total) by mouth daily. 90 tablet 3  . apixaban (ELIQUIS) 2.5 MG TABS tablet Take 1 tablet (2.5 mg total) by mouth 2 (two) times daily. 180 tablet 3  . atorvastatin (LIPITOR) 20 MG  tablet Take 1 tablet (20 mg total) by mouth daily at 6 PM. 90 tablet 3  . carvedilol (COREG) 6.25 MG tablet Take 1 tablet (6.25 mg total) by mouth 2 (two) times daily with a meal. 180 tablet 3  . furosemide (LASIX) 20 MG tablet Take 1 tablet (20 mg total) by mouth daily. 90 tablet 3  . hydrALAZINE (APRESOLINE) 25 MG tablet Take 0.5 tablets (12.5 mg total) by mouth 2 (two) times daily before a meal. 270 tablet 3  . isosorbide mononitrate (IMDUR) 30 MG 24 hr tablet Take 1 tablet (30 mg total) by mouth daily. 90 tablet 3  . Magnesium 250 MG TABS Take 1 tablet by mouth every evening.     . Multiple Vitamin (MULTIVITAMIN) capsule Take 1 capsule by mouth daily.    Marland Kitchen omega-3 acid ethyl esters (LOVAZA) 1 G capsule Take 2 g by mouth 2 (two) times daily.    . ranitidine (ZANTAC) 300 MG tablet Take 300 mg by mouth at bedtime.     No facility-administered medications prior to visit.      Allergies:   Novocain [procaine]   Social History   Social History  . Marital status: Married    Spouse name: N/A  . Number of children: N/A  . Years of education: N/A   Social History Main Topics  . Smoking status: Never Smoker  . Smokeless tobacco: Never Used  . Alcohol use Yes     Comment: 04/22/2015 "nothing since the 1980s; never had a problem w/it"  . Drug use: No  . Sexual activity: No   Other Topics Concern  . None   Social History Narrative  . None     Family History:  The patient's family history includes Alzheimer's disease in his mother; COPD in his daughter and sister; Cancer in his sister; Depression in his sister; Heart disease in his sister; Hyperlipidemia in his sister; Hypertension in his sister.   ROS:   Please see the history of present illness.    ROS All other systems reviewed and are negative.  No flowsheet data found.     PHYSICAL EXAM:   VS:  BP (!) 154/86   Pulse 81   Ht 6' (1.829 m)   Wt 205 lb 12.8 oz (93.4 kg)   SpO2 97%   BMI 27.91 kg/m    GEN: Well nourished,  well developed, in no acute distress  HEENT: normal  Neck: no JVD, carotid bruits, or masses Cardiac: RRR; no murmurs, rubs, or gallops,no edema.  Intact distal pulses bilaterally.  Respiratory:  clear to auscultation bilaterally, normal work of breathing GI: soft, nontender, nondistended, + BS MS: no deformity or atrophy  Skin: warm and dry, no rash Neuro:  Alert and Oriented x 3, Strength and sensation are intact Psych: euthymic mood, full affect  Wt Readings from Last 3 Encounters:  01/25/16 205 lb 12.8 oz (93.4 kg)  08/04/15 197 lb (  89.4 kg)  07/28/15 203 lb (92.1 kg)      Studies/Labs Reviewed:   EKG:  EKG is not ordered today.    Recent Labs: 06/16/2015: B Natriuretic Peptide 1,510.4 06/17/2015: Hemoglobin 13.7; Platelets 241 06/19/2015: BUN 44; Creatinine, Ser 1.75; Potassium 4.1; Sodium 137 07/28/2015: ALT 35; TSH 1.27   Lipid Panel    Component Value Date/Time   CHOL 123 (L) 01/19/2015 1005   CHOL 93 (L) 06/16/2014 0914   TRIG 161 (H) 01/19/2015 1005   TRIG 124 06/16/2014 0914   HDL 45 01/19/2015 1005   HDL 34 (L) 06/16/2014 0914   CHOLHDL 2.7 01/19/2015 1005   VLDL 32 (H) 01/19/2015 1005   LDLCALC 46 01/19/2015 1005   LDLCALC 34 06/16/2014 0914    Additional studies/ records that were reviewed today include:  none    ASSESSMENT:    1. Chronic combined systolic and diastolic heart failure (HCC)   2. Cardiomyopathy, ischemic: EF roughly 30%   3. CAD status post CABG   4. Persistent atrial fibrillation ([place])   5. Aortic stenosis, severe   6. Essential hypertension   7. Dyslipidemia      PLAN:  In order of problems listed above:  1. Chronic combined systolic and diastolic CHF -  He appears euvolemic on exam today.  His dry weight is 197-200lbs. Weight at home today 201 and in office 205lbs.  Continue BB/Imdur/hydralazine and diuretic.  Check BMET. 2. Ischemic DCM with EF 15-20% 04/2015 s/p AICD.  3. ASCAD s/p CABG with no angina.  Continue  statin/BB/Imdur. 4. Persistent atrial fibrillation maintaining NSR.  Continue Amio/apixaban and BB.  Check PFTs with DLCO.  He is up to date on TSH and LFTs.  5. Severe AS s/p AVR 6. HTN - BP controlled on current meds. Continue BB/hydralazine 7. Dyslipidemia - LDL goal < 70.  Continue statin/BB.  Check FLP and ALT.  He wants this to be done at PCP which is closer to his home so I will give him a Rx for this.     Medication Adjustments/Labs and Tests Ordered: Current medicines are reviewed at length with the patient today.  Concerns regarding medicines are outlined above.  Medication changes, Labs and Tests ordered today are listed in the Patient Instructions below.  There are no Patient Instructions on file for this visit.   Signed, [name], MD  01/25/2016 10:30 AM    [facility] [address] [place], [place], [place]  [address] Phone: [phone]; Fax: [phone]

## 2016-01-25 NOTE — Patient Instructions (Signed)
Medication Instructions:  Your physician recommends that you continue on your current medications as directed. Please refer to the Current Medication list given to you today.   Labwork: You have been given a prescription to have your FASTING labs drawn at your primary care physician's office.  Testing/Procedures: Your physician has recommended that you have a pulmonary function test. Pulmonary Function Tests are a group of tests that measure how well air moves in and out of your lungs.  Follow-Up: Your physician wants you to follow-up in: 6 months with Dr Radford Pax. You will receive a reminder letter in the mail two months in advance. If you don't receive a letter, please call our office to schedule the follow-up appointment.   Any Other Special Instructions Will Be Listed Below (If Applicable).     If you need a refill on your cardiac medications before your next appointment, please call your pharmacy.

## 2016-01-26 ENCOUNTER — Ambulatory Visit (INDEPENDENT_AMBULATORY_CARE_PROVIDER_SITE_OTHER): Payer: Commercial Managed Care - HMO | Admitting: *Deleted

## 2016-01-26 DIAGNOSIS — I255 Ischemic cardiomyopathy: Secondary | ICD-10-CM | POA: Diagnosis not present

## 2016-01-26 DIAGNOSIS — I5042 Chronic combined systolic (congestive) and diastolic (congestive) heart failure: Secondary | ICD-10-CM

## 2016-01-26 NOTE — Progress Notes (Signed)
Remote ICD transmission.   

## 2016-01-27 ENCOUNTER — Encounter: Payer: Self-pay | Admitting: Cardiology

## 2016-02-03 ENCOUNTER — Ambulatory Visit (HOSPITAL_COMMUNITY)
Admission: RE | Admit: 2016-02-03 | Discharge: 2016-02-03 | Disposition: A | Discharge status: Home / Self Care | Payer: Commercial Managed Care - HMO | Source: Ambulatory Visit | Attending: Cardiology | Admitting: Cardiology

## 2016-02-03 DIAGNOSIS — I481 Persistent atrial fibrillation: Secondary | ICD-10-CM | POA: Diagnosis not present

## 2016-02-03 DIAGNOSIS — I4819 Other persistent atrial fibrillation: Secondary | ICD-10-CM

## 2016-02-03 LAB — PULMONARY FUNCTION TEST
DL/VA % PRED: 49 %
DL/VA: 2.34 ml/min/mmHg/L
DLCO UNC: 14.97 ml/min/mmHg
DLCO unc % pred: 42 %
FEF 25-75 POST: 1.44 L/s
FEF 25-75 Pre: 0.95 L/sec
FEF2575-%Change-Post: 52 %
FEF2575-%PRED-PRE: 44 %
FEF2575-%Pred-Post: 68 %
FEV1-%Change-Post: 3 %
FEV1-%PRED-PRE: 75 %
FEV1-%Pred-Post: 78 %
FEV1-Post: 2.41 L
FEV1-Pre: 2.32 L
FEV1FVC-%Change-Post: -6 %
FEV1FVC-%PRED-PRE: 83 %
FEV6-%CHANGE-POST: 10 %
FEV6-%PRED-POST: 98 %
FEV6-%Pred-Pre: 88 %
FEV6-POST: 3.96 L
FEV6-Pre: 3.58 L
FEV6FVC-%CHANGE-POST: 0 %
FEV6FVC-%PRED-POST: 97 %
FEV6FVC-%Pred-Pre: 97 %
FVC-%Change-Post: 10 %
FVC-%Pred-Post: 100 %
FVC-%Pred-Pre: 90 %
FVC-Post: 4.32 L
FVC-Pre: 3.91 L
PRE FEV1/FVC RATIO: 59 %
PRE FEV6/FVC RATIO: 92 %
Post FEV1/FVC ratio: 56 %
Post FEV6/FVC ratio: 92 %
RV % pred: 135 %
RV: 3.77 L
TLC % PRED: 105 %
TLC: 7.87 L

## 2016-02-03 MED ORDER — ALBUTEROL SULFATE (2.5 MG/3ML) 0.083% IN NEBU
2.5000 mg | INHALATION_SOLUTION | Freq: Once | RESPIRATORY_TRACT | Status: AC
  Administered 2016-02-03: 2.5 mg via RESPIRATORY_TRACT

## 2016-02-10 ENCOUNTER — Telehealth: Payer: Self-pay | Admitting: Cardiology

## 2016-02-10 DIAGNOSIS — R942 Abnormal results of pulmonary function studies: Secondary | ICD-10-CM

## 2016-02-10 NOTE — Telephone Encounter (Signed)
Patient states the phone cut off when the scheduler called him to schedule Pulmonary OV. He requests a call back from scheduling.   Message sent to call the patient back.

## 2016-02-10 NOTE — Telephone Encounter (Signed)
Follow Up: ° ° ° °Returning your call from yesterday. °

## 2016-02-10 NOTE — Telephone Encounter (Signed)
-----  Message from Sueanne Margarita, MD sent at 02/05/2016  6:38 PM EDT ----- DLCO has significantly declined ? Secondary to Amio - please get in to see Dr. Chase Caller ASAP

## 2016-02-10 NOTE — Telephone Encounter (Signed)
New Message  Pt call requesting to speak with RN. Pt states he spoke with RN but didn't fully understand. Please call back to discuss

## 2016-02-10 NOTE — Telephone Encounter (Signed)
Informed patient of results and verbal understanding expressed.  Referral to Dr. Chase Caller placed. Patient agrees with treatment plan.

## 2016-02-13 ENCOUNTER — Encounter: Payer: Self-pay | Admitting: Cardiology

## 2016-02-13 DIAGNOSIS — K2270 Barrett's esophagus without dysplasia: Secondary | ICD-10-CM | POA: Diagnosis not present

## 2016-02-13 DIAGNOSIS — I255 Ischemic cardiomyopathy: Secondary | ICD-10-CM | POA: Diagnosis not present

## 2016-02-13 DIAGNOSIS — E7800 Pure hypercholesterolemia, unspecified: Secondary | ICD-10-CM | POA: Diagnosis not present

## 2016-02-13 DIAGNOSIS — E1121 Type 2 diabetes mellitus with diabetic nephropathy: Secondary | ICD-10-CM | POA: Diagnosis not present

## 2016-02-13 DIAGNOSIS — I480 Paroxysmal atrial fibrillation: Secondary | ICD-10-CM | POA: Diagnosis not present

## 2016-02-13 DIAGNOSIS — I10 Essential (primary) hypertension: Secondary | ICD-10-CM | POA: Diagnosis not present

## 2016-02-13 DIAGNOSIS — I509 Heart failure, unspecified: Secondary | ICD-10-CM | POA: Diagnosis not present

## 2016-02-13 DIAGNOSIS — I2510 Atherosclerotic heart disease of native coronary artery without angina pectoris: Secondary | ICD-10-CM | POA: Diagnosis not present

## 2016-02-13 DIAGNOSIS — N183 Chronic kidney disease, stage 3 (moderate): Secondary | ICD-10-CM | POA: Diagnosis not present

## 2016-02-21 LAB — CUP PACEART REMOTE DEVICE CHECK
Battery Remaining Longevity: 89 mo
Battery Voltage: 3.01 V
Brady Statistic AP VS Percent: 0.4 %
Brady Statistic AS VS Percent: 0.14 %
Date Time Interrogation Session: 20171019062722
HighPow Impedance: 66 Ohm
Implantable Lead Implant Date: 20170113
Implantable Lead Implant Date: 20170113
Implantable Lead Location: 753859
Implantable Lead Model: 4298
Implantable Pulse Generator Implant Date: 20170113
Lead Channel Impedance Value: 323 Ohm
Lead Channel Impedance Value: 342 Ohm
Lead Channel Impedance Value: 399 Ohm
Lead Channel Impedance Value: 399 Ohm
Lead Channel Impedance Value: 589 Ohm
Lead Channel Impedance Value: 627 Ohm
Lead Channel Impedance Value: 665 Ohm
Lead Channel Impedance Value: 665 Ohm
Lead Channel Pacing Threshold Amplitude: 0.375 V
Lead Channel Pacing Threshold Pulse Width: 0.4 ms
Lead Channel Sensing Intrinsic Amplitude: 11 mV
Lead Channel Sensing Intrinsic Amplitude: 11 mV
Lead Channel Sensing Intrinsic Amplitude: 4.25 mV
Lead Channel Setting Pacing Amplitude: 1.5 V
Lead Channel Setting Pacing Amplitude: 1.5 V
Lead Channel Setting Pacing Amplitude: 2 V
Lead Channel Setting Pacing Pulse Width: 0.4 ms
Lead Channel Setting Pacing Pulse Width: 0.4 ms
MDC IDC LEAD IMPLANT DT: 20170113
MDC IDC LEAD LOCATION: 753858
MDC IDC LEAD LOCATION: 753860
MDC IDC MSMT LEADCHNL LV IMPEDANCE VALUE: 380 Ohm
MDC IDC MSMT LEADCHNL LV IMPEDANCE VALUE: 380 Ohm
MDC IDC MSMT LEADCHNL LV IMPEDANCE VALUE: 437 Ohm
MDC IDC MSMT LEADCHNL LV IMPEDANCE VALUE: 703 Ohm
MDC IDC MSMT LEADCHNL LV PACING THRESHOLD PULSEWIDTH: 0.4 ms
MDC IDC MSMT LEADCHNL RA IMPEDANCE VALUE: 456 Ohm
MDC IDC MSMT LEADCHNL RA PACING THRESHOLD AMPLITUDE: 0.625 V
MDC IDC MSMT LEADCHNL RA SENSING INTR AMPL: 4.25 mV
MDC IDC MSMT LEADCHNL RV PACING THRESHOLD AMPLITUDE: 0.625 V
MDC IDC MSMT LEADCHNL RV PACING THRESHOLD PULSEWIDTH: 0.4 ms
MDC IDC SET LEADCHNL RV SENSING SENSITIVITY: 0.3 mV
MDC IDC STAT BRADY AP VP PERCENT: 95.98 %
MDC IDC STAT BRADY AS VP PERCENT: 3.47 %
MDC IDC STAT BRADY RA PERCENT PACED: 96.38 %
MDC IDC STAT BRADY RV PERCENT PACED: 98.84 %

## 2016-04-18 ENCOUNTER — Encounter: Payer: Self-pay | Admitting: Pulmonary Disease

## 2016-04-18 ENCOUNTER — Ambulatory Visit (INDEPENDENT_AMBULATORY_CARE_PROVIDER_SITE_OTHER): Payer: Commercial Managed Care - HMO | Admitting: Pulmonary Disease

## 2016-04-18 VITALS — BP 134/76 | HR 64 | Ht 72.0 in | Wt 213.0 lb

## 2016-04-18 DIAGNOSIS — T462X4A Poisoning by other antidysrhythmic drugs, undetermined, initial encounter: Secondary | ICD-10-CM

## 2016-04-18 NOTE — Patient Instructions (Signed)
Pulmonary function test were reviewed today. We'll schedule for a high-resolution CT of the chest for further evaluation of amiodarone toxicity Return to clinic in 1 month after CT scan for review and further workup as needed.

## 2016-04-18 NOTE — Progress Notes (Signed)
[name]    [id number]    September 12, 1932  Primary Care Physician:MEYERS, Annie Main, MD  Referring Physician: Betty G Martinique, MD [address] [place], [place] [address]  Chief complaint: Eval for abnormal pulmonary function test  HPI: [name] is an 83 year old with past medical history history of atrial fibrillation on amiodarone and Eliquis anticoagulation, severe AS with aortic wall replacement , coronary artery disease cardiac myopathy [EF 50-20 percent]. He has been maintained on amiodarone since 2016 with regular PFTs. 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  He has some mild dyspnea on exertion but denies any other respiratory complaint. He does not any cough, sputum production, wheezing, chest pain, palpitations. He is a lifelong nonsmoker. He used to work as a Dealer, Pension scheme manager, Lobbyist. He does not report any exposure to asbestos.   Outpatient Encounter Prescriptions as of 04/18/2016  Medication Sig  . acetaminophen (TYLENOL) 500 MG tablet Take 500 mg by mouth every 6 (six) hours as needed for moderate pain.  Marland Kitchen amiodarone (PACERONE) 200 MG tablet Take 1 tablet (200 mg total) by mouth daily.  Marland Kitchen apixaban (ELIQUIS) 2.5 MG TABS tablet Take 1 tablet (2.5 mg total) by mouth 2 (two) times daily.  Marland Kitchen atorvastatin (LIPITOR) 20 MG tablet Take 1 tablet (20 mg total) by mouth daily at 6 PM.  . carvedilol (COREG) 6.25 MG tablet Take 1 tablet (6.25 mg total) by mouth 2 (two) times daily with a meal.  . furosemide (LASIX) 20 MG tablet Take 1 tablet (20 mg total) by mouth daily.  . hydrALAZINE (APRESOLINE) 25 MG tablet Take 0.5 tablets (12.5 mg total) by mouth 2 (two) times daily before a meal.  . isosorbide mononitrate (IMDUR) 30 MG 24 hr tablet Take 1 tablet (30 mg total) by mouth daily.  . Magnesium 250 MG TABS Take 1 tablet by mouth every evening.   . Multiple Vitamin (MULTIVITAMIN)  capsule Take 1 capsule by mouth daily.  Marland Kitchen omega-3 acid ethyl esters (LOVAZA) 1 G capsule Take 2 g by mouth 2 (two) times daily.  . ranitidine (ZANTAC) 300 MG tablet Take 300 mg by mouth at bedtime.  . traMADol (ULTRAM) 50 MG tablet Take 1 tablet by mouth as directed.   No facility-administered encounter medications on file as of 04/18/2016.     Allergies as of 04/18/2016 - Review Complete 04/18/2016  Allergen Reaction Noted  . Novocain [procaine] Swelling 08/22/2014    Past Medical History:  Diagnosis Date  . AICD (automatic cardioverter/defibrillator) present   . Aortic valve disorder 2009   severe AS s/p AVR with pericardial tissue valve  . Arthritis    "minor everywhere" (04/22/2015)  . Asthma    "a touch"  . Barrett esophagus   . CAD (coronary artery disease)    s/p 2 vessel CABG with LIMA to LAD, SVG to diagonal 1, SVG to RCA 12/09  . Cardiomyopathy ([place])    EF 15-20% by echo 2017  . Chronic combined systolic and diastolic CHF (congestive heart failure) (HCC)    dry weight 197-200lbs.  . CKD (chronic kidney disease)   . Diabetes ([place])    "I'm prediabetic" (04/22/2015)  . GERD (gastroesophageal reflux disease)   . H/O: rheumatic fever   . Hepatitis 1957   "don't know what kind:  . History of hiatal hernia   . History of PFTs    a. Amiodarone started  5/16 >> PFTs w/ DLCO 6/16:  FEV1 72% predicted, FEV1/FVC 66%, DLCO 66% >> minimal reversible obstructive airways disease with mild diffusion defect (suggestive of emphysema but absence of hyperinflation inconsistent with dx)  . HTN (hypertension)   . Hypercholesteremia   . Hypotension   . Orthostatic hypotension   . Persistent atrial fibrillation ([place])    post op afib with no reoccurence  . Pneumonia 08/2014   "dr thought I may have had a touch"    Past Surgical History:  Procedure Laterality Date  . AORTIC VALVE REPLACEMENT  with 23-mm Magna Ease pericardial valve, model number   with 23-mm Magna Ease pericardial  valve, model number3300TFX, serial number [id number]   . APPENDECTOMY  1940s  . BI-VENTRICULAR IMPLANTABLE CARDIOVERTER DEFIBRILLATOR  (CRT-D)  04/22/2015  . CARDIAC CATHETERIZATION N/A 11/04/2014   Procedure: Left Heart Cath and Cors/Grafts Angiography;  Surgeon: Leonie Man, MD;  Location: [facility];  Service: Cardiovascular;  Laterality: N/A;  . CARDIAC VALVE REPLACEMENT    . CARDIOVERSION N/A 01/27/2013   Procedure: CARDIOVERSION;  Surgeon: [name] Margarita, MD;  Location: [facility];  Service: Cardiovascular;  Laterality: N/A;  . CARDIOVERSION N/A 08/25/2014   Procedure: CARDIOVERSION;  Surgeon: [name] Klein, MD;  [facility];  Service: Cardiovascular;  Laterality: N/A;  . CATARACT EXTRACTION W/ INTRAOCULAR LENS  IMPLANT, BILATERAL Bilateral   . CORONARY ARTERY BYPASS GRAFT  03/10/2008   x 3 Dr. Roxan [name]  . EP IMPLANTABLE DEVICE N/A 04/22/2015   Procedure: BiV ICD Insertion CRT-D;  Surgeon: [name] Meredith Leeds, MD;  Location: [facility];  Service: Cardiovascular;  Laterality: N/A;  . ESOPHAGOGASTRODUODENOSCOPY (EGD) WITH ESOPHAGEAL DILATION  X1  . TONSILLECTOMY      Family History  Problem Relation Age of Onset  . Alzheimer's disease Mother   . COPD Daughter   . Alzheimer's disease    . COPD Sister   . Depression Sister   . Heart disease Sister   . Hyperlipidemia Sister   . Hypertension Sister   . Esophageal cancer Sister     + smoker    Social History   Social History  . Marital status: Married    Spouse name: N/A  . Number of children: N/A  . Years of education: N/A   Occupational History  . Not on file.   Social History [name] Topics  . Smoking status: Never Smoker  . Smokeless tobacco: Never Used  . Alcohol use Yes     Comment: 04/22/2015 "nothing since the 1980s; never had a problem w/it"  . Drug use: No  . Sexual activity: No   Other Topics Concern  . 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  HENT: Negative.   Eyes: Negative for blurred vision.  Respiratory: as per HPI  Cardiovascular: Negative for chest pain and palpitations.  Gastrointestinal: Negative for vomiting, diarrhea, blood per rectum. Genitourinary: Negative for dysuria, urgency, frequency and hematuria.  Musculoskeletal: Negative for myalgias, back pain and joint pain.  Skin: Negative for itching and rash.  Neurological: Negative for dizziness, tremors, focal weakness, seizures and loss of consciousness.  Endo/Heme/Allergies: Negative for environmental allergies.  Psychiatric/Behavioral: Negative for depression, suicidal ideas and hallucinations.  All other systems reviewed  and are negative.  Physical Exam: Blood pressure 134/76, pulse 64, height 6' (1.829 m), weight 213 lb (96.6 kg), SpO2 97 %. Gen:      No acute distress HEENT:  EOMI, sclera anicteric Neck:     No masses; no thyromegaly Lungs:    Clear to auscultation bilaterally; normal respiratory effort CV:         Regular rate and rhythm; no murmurs Abd:      + bowel sounds; soft, non-tender; no palpable masses, no distension Ext:    No edema; adequate peripheral perfusion Skin:      Warm and dry; no rash Neuro: alert and oriented x 3 Psych: normal mood and affect  Data Reviewed: PFTs 10/27/7 FVC 4.32 [100%) FEV1 2.41 [78%] F/F 56 TLC 105% DLCO 42% Mild obstructive disease with severe diffusion defect. Compared to 2016 has been a decline in the diffusion capacity.  PFTs 09/13/14 FVC 4.22 [96%) FEV1 2.49 (79%) F/F 59 TLC 102% DLCO 66% Mild obstructive avid disease, mild diffusion defect  CXR 06/16/15 No active cardiopulmonary disease. All images reviewed.  Assessment:  Evaluation for abnormal PFTs Concern for amiodarone  pulmonary toxicity.  His PFTs were reviewed with him. They didn't show a reduction in DLCO from 2016 2 2017. He however denies any new pulmonary complaints. I'll evaluate this further with a high-resolution CT of the chest. He'll return to clinic in 1 month for review of CT and further workup as needed.  Plan/Recommendations: - High res CT of chest.  Marshell Garfinkel MD [facility] Pager [phone] 04/18/2016, 3:09 PM  CC: [name], [name] G, MD

## 2016-04-24 ENCOUNTER — Institutional Professional Consult (permissible substitution): Payer: Commercial Managed Care - HMO | Admitting: Internal Medicine

## 2016-05-01 ENCOUNTER — Telehealth: Payer: Self-pay | Admitting: Pulmonary Disease

## 2016-05-01 NOTE — Telephone Encounter (Signed)
Spoke with [name] and she stated that there is a note that the pt was going to call back once he had his ride figured out.  [name] stated that they will call him and schedule this.

## 2016-05-08 ENCOUNTER — Ambulatory Visit (INDEPENDENT_AMBULATORY_CARE_PROVIDER_SITE_OTHER)
Admission: RE | Admit: 2016-05-08 | Discharge: 2016-05-08 | Disposition: A | Discharge status: Home / Self Care | Payer: Commercial Managed Care - HMO | Source: Ambulatory Visit | Attending: Pulmonary Disease | Admitting: Pulmonary Disease

## 2016-05-08 DIAGNOSIS — T462X4A Poisoning by other antidysrhythmic drugs, undetermined, initial encounter: Secondary | ICD-10-CM | POA: Diagnosis not present

## 2016-05-08 DIAGNOSIS — I700 Atherosclerosis of aorta: Secondary | ICD-10-CM | POA: Diagnosis not present

## 2016-06-01 ENCOUNTER — Encounter: Payer: Self-pay | Admitting: Cardiology

## 2016-07-12 ENCOUNTER — Encounter: Payer: Self-pay | Admitting: Cardiology

## 2016-07-23 ENCOUNTER — Other Ambulatory Visit: Payer: Self-pay | Admitting: Cardiology

## 2016-07-23 DIAGNOSIS — I480 Paroxysmal atrial fibrillation: Secondary | ICD-10-CM

## 2016-07-29 ENCOUNTER — Encounter: Payer: Self-pay | Admitting: Cardiology

## 2016-07-29 NOTE — Progress Notes (Signed)
Cardiology Office Note    Date:  07/30/2016   ID:  [name], DOB July 09, 1932, MRN [id number]  PCP:  [name], MD  Cardiologist:  [name] Him, MD   Chief Complaint  Patient presents with  . Coronary Artery Disease  . Hypertension  . Cardiomyopathy  . Hyperlipidemia  . Atrial Fibrillation    History of Present Illness:  [name] is a 83 y.o. male with a history of HTN, AS s/p AVR, chronic combined systolic/diastolic CHF in setting of Afib with RVR, orthostatic hypotension,  DCM EF 25-30%, ASCAD s/p CABG and PAF. He is here today for followup. He is doing well and denies anypalpitations, dizziness or syncope, chest pain, PND, orthopnea or LE edema.He has some chronic DOE with extreme exertion.  He typically has increased abdominal distension when volume overloaded and he has not had that recently.  His dry weight is around 200-205lbs. He weights daily and this am it was 205lbs.  It is 210lbs in our office which is down 3lbs from January. He is able to get out and work in his garden with no problems.    Past Medical History:  Diagnosis Date  . AICD (automatic cardioverter/defibrillator) present   . Aortic valve disorder 2009   severe AS s/p AVR with pericardial tissue valve  . Arthritis    "minor everywhere" (04/22/2015)  . Asthma    "a touch"  . Barrett esophagus   . CAD (coronary artery disease)    s/p 2 vessel CABG with LIMA to LAD, SVG to diagonal 1, SVG to RCA 12/09  . Cardiomyopathy ([place])    EF 15-20% by echo 2017  . Chronic combined systolic and diastolic CHF (congestive heart failure) (HCC)    dry weight 197-200lbs.  . CKD (chronic kidney disease)   . Diabetes ([place])    "I'm prediabetic" (04/22/2015)  . GERD (gastroesophageal reflux disease)   . H/O: rheumatic fever   . Hepatitis 1957   "don't know what kind:  . History of hiatal hernia   . History of PFTs    a. Amiodarone started 5/16 >> PFTs w/ DLCO 6/16:  FEV1 72% predicted, FEV1/FVC  66%, DLCO 66% >> minimal reversible obstructive airways disease with mild diffusion defect (suggestive of emphysema but absence of hyperinflation inconsistent with dx)  . HTN (hypertension)   . Hypercholesteremia   . Hypotension   . Orthostatic hypotension   . Persistent atrial fibrillation ([place])    post op afib with no reoccurence  . Pneumonia 08/2014   "dr thought I may have had a touch"    Past Surgical History:  Procedure Laterality Date  . AORTIC VALVE REPLACEMENT  with 23-mm Magna Ease pericardial valve, model number   with 23-mm Magna Ease pericardial valve, model number3300TFX, serial number [id number]   . APPENDECTOMY  1940s  . BI-VENTRICULAR IMPLANTABLE CARDIOVERTER DEFIBRILLATOR  (CRT-D)  04/22/2015  . CARDIAC CATHETERIZATION N/A 11/04/2014   Procedure: Left Heart Cath and Cors/Grafts Angiography;  Surgeon: Leonie Man, MD;  Location: [facility];  Service: Cardiovascular;  Laterality: N/A;  . CARDIAC VALVE REPLACEMENT    . CARDIOVERSION N/A 01/27/2013   Procedure: CARDIOVERSION;  Surgeon: Sueanne Margarita, MD;  Location: [facility];  Service: Cardiovascular;  Laterality: N/A;  . CARDIOVERSION N/A 08/25/2014   Procedure: CARDIOVERSION;  Surgeon: Sanda Klein, MD;  [facility];  Service: Cardiovascular;  Laterality: N/A;  . CATARACT EXTRACTION W/ INTRAOCULAR LENS  IMPLANT, BILATERAL Bilateral   . CORONARY  ARTERY BYPASS GRAFT  03/10/2008   x 3 Dr. Roxan Hockey  . EP IMPLANTABLE DEVICE N/A 04/22/2015   Procedure: BiV ICD Insertion CRT-D;  Surgeon: [name], MD;  Location: [facility];  Service: Cardiovascular;  Laterality: N/A;  . ESOPHAGOGASTRODUODENOSCOPY (EGD) WITH ESOPHAGEAL DILATION  X1  . TONSILLECTOMY      Current Medications: Current Meds  Medication Sig  . acetaminophen (TYLENOL) 500 MG tablet Take 500 mg by mouth every 6 (six) hours as needed for moderate pain.  Marland Kitchen amiodarone (PACERONE) 200 MG tablet TAKE 1 TABLET (200 MG TOTAL) BY  MOUTH DAILY.  Marland Kitchen apixaban (ELIQUIS) 2.5 MG TABS tablet Take 1 tablet (2.5 mg total) by mouth 2 (two) times daily.  Marland Kitchen atorvastatin (LIPITOR) 20 MG tablet Take 1 tablet (20 mg total) by mouth daily at 6 PM.  . carvedilol (COREG) 6.25 MG tablet Take 1 tablet (6.25 mg total) by mouth 2 (two) times daily with a meal.  . furosemide (LASIX) 20 MG tablet Take 1 tablet (20 mg total) by mouth daily.  . hydrALAZINE (APRESOLINE) 25 MG tablet Take 0.5 tablets (12.5 mg total) by mouth 2 (two) times daily before a meal.  . isosorbide mononitrate (IMDUR) 30 MG 24 hr tablet Take 1 tablet (30 mg total) by mouth daily.  . Magnesium 250 MG TABS Take 1 tablet by mouth every evening.   . Multiple Vitamin (MULTIVITAMIN) capsule Take 1 capsule by mouth daily.  Marland Kitchen omega-3 acid ethyl esters (LOVAZA) 1 G capsule Take 2 g by mouth 2 (two) times daily.  . ranitidine (ZANTAC) 300 MG tablet Take 300 mg by mouth at bedtime.  . traMADol (ULTRAM) 50 MG tablet Take 1 tablet by mouth as directed.    Allergies:   Novocain [procaine]   Social History   Social History  . Marital status: Married    Spouse name: N/A  . Number of children: N/A  . Years of education: N/A   Social History Main Topics  . Smoking status: Never Smoker  . Smokeless tobacco: Never Used  . Alcohol use Yes     Comment: 04/22/2015 "nothing since the 1980s; never had a problem w/it"  . Drug use: No  . Sexual activity: No   Other Topics Concern  . None   Social History Narrative   Married, lives with spouse [name]   4 children, 2 boys and 2 girls > one daughter passed   OCCUPATION: retired, Pension scheme manager for [employer]     Family History:  The patient's family history includes Alzheimer's disease in his mother; COPD in his daughter and sister; Depression in his sister; Esophageal cancer in his sister; Heart disease in his sister; Hyperlipidemia in his sister; Hypertension in his sister.   ROS:   Please see the history of present  illness.    ROS All other systems reviewed and are negative.  No flowsheet data found.     PHYSICAL EXAM:   VS:  BP (!) 146/82   Pulse 86   Ht 6' (1.829 m)   Wt 210 lb (95.3 kg)   SpO2 96%   BMI 28.48 kg/m    GEN: Well nourished, well developed, in no acute distress  HEENT: normal  Neck: no JVD, carotid bruits, or masses Cardiac: RRR; no murmurs, rubs, or gallops,no edema.  Intact distal pulses bilaterally.  Respiratory:  clear to auscultation bilaterally, normal work of breathing GI: soft, nontender, nondistended, + BS MS: no deformity or atrophy  Skin: warm and  dry, no rash Neuro:  Alert and Oriented x 3, Strength and sensation are intact Psych: euthymic mood, full affect  Wt Readings from Last 3 Encounters:  07/30/16 210 lb (95.3 kg)  04/18/16 213 lb (96.6 kg)  01/25/16 205 lb 12.8 oz (93.4 kg)      Studies/Labs Reviewed:   EKG:  EKG is ordered today.  The ekg ordered today demonstrates AV paced at 63bpm  Recent Labs: No results found for requested labs within last 8760 hours.   Lipid Panel    Component Value Date/Time   CHOL 123 (L) 01/19/2015 1005   CHOL 93 (L) 06/16/2014 0914   TRIG 161 (H) 01/19/2015 1005   TRIG 124 06/16/2014 0914   HDL 45 01/19/2015 1005   HDL 34 (L) 06/16/2014 0914   CHOLHDL 2.7 01/19/2015 1005   VLDL 32 (H) 01/19/2015 1005   LDLCALC 46 01/19/2015 1005   LDLCALC 34 06/16/2014 0914    Additional studies/ records that were reviewed today include:  none    ASSESSMENT:    1. CAD status post CABG   2. Cardiomyopathy, ischemic: EF roughly 30%   3. Chronic combined systolic and diastolic heart failure ([place])   4. Essential hypertension   5. Aortic stenosis, severe   6. Persistent atrial fibrillation ([place])   7. Dyslipidemia   8. Paroxysmal atrial fibrillation (HCC)      PLAN:  In order of problems listed above:  1. ASCAD - s/p 2 vessel CABG with LIMA to LAD, SVG to diagonal 1, SVG to RCA 12/09.  He has not had any anginal  symptoms.  He will continue on statin, BB and long acting nitrate.  He is not on ASA due to the NOAC.  Check NOAC panel   2. Ischemic DCM with EF 15-20% by echo 04/2015% - s/p BiVAICD for DCM with LBBB. I will repeat echo to see if LVF has improved.  He will continue on Hydralazine, long acting nitrates, diuretic and BB.  3. Chronic combined systolic/diastolic CHF- his weight is stable and he appears euvolemic on exam. His dry weight is around 200-205lbs and he weighs daily.  This am his scales read 205lbs.  He will continue on BB, hydralazine and Imdur.  No ACE I or ARB due to CKD. Could consider adding aldactone as creatinine was 1.75 recently.  I will check a BMET.   4. HTN - Bp appears controlled today on exam. He will continue on BB, hydralazine  5. Severe AS s/p pericardial AVR  6. Persistent atrial fibrillation - he is maintaining NSR.  He will continue on BB, amiodarone and apixaban.  I will check a TSH and LFTs today. He had PFTs done last fall which were abnormal and high resolution chest CT showed no evidence of amio induced lung toxicity. He sees his eye doctor yearly to follow for amio effects on eyes.   7. Hyperlipidemia with LDL goal < 70.  He will continue on Lipitor. I will get an FLP and ALT.    Medication Adjustments/Labs and Tests Ordered: Current medicines are reviewed at length with the patient today.  Concerns regarding medicines are outlined above.  Medication changes, Labs and Tests ordered today are listed in the Patient Instructions below.  There are no Patient Instructions on file for this visit.   Signed, [name] Him, MD  07/30/2016 10:36 AM    [name], [place], [place]  [address] Phone: [phone]; Fax: [phone]

## 2016-07-30 ENCOUNTER — Encounter: Payer: Self-pay | Admitting: Cardiology

## 2016-07-30 ENCOUNTER — Ambulatory Visit (INDEPENDENT_AMBULATORY_CARE_PROVIDER_SITE_OTHER): Payer: Medicare HMO | Admitting: Cardiology

## 2016-07-30 VITALS — BP 146/82 | HR 86 | Ht 72.0 in | Wt 210.0 lb

## 2016-07-30 DIAGNOSIS — I4819 Other persistent atrial fibrillation: Secondary | ICD-10-CM

## 2016-07-30 DIAGNOSIS — I255 Ischemic cardiomyopathy: Secondary | ICD-10-CM

## 2016-07-30 DIAGNOSIS — E785 Hyperlipidemia, unspecified: Secondary | ICD-10-CM | POA: Diagnosis not present

## 2016-07-30 DIAGNOSIS — I5042 Chronic combined systolic (congestive) and diastolic (congestive) heart failure: Secondary | ICD-10-CM

## 2016-07-30 DIAGNOSIS — I481 Persistent atrial fibrillation: Secondary | ICD-10-CM | POA: Diagnosis not present

## 2016-07-30 DIAGNOSIS — I350 Nonrheumatic aortic (valve) stenosis: Secondary | ICD-10-CM | POA: Diagnosis not present

## 2016-07-30 DIAGNOSIS — I10 Essential (primary) hypertension: Secondary | ICD-10-CM

## 2016-07-30 DIAGNOSIS — I25758 Atherosclerosis of native coronary artery of transplanted heart with other forms of angina pectoris: Secondary | ICD-10-CM | POA: Diagnosis not present

## 2016-07-30 DIAGNOSIS — I480 Paroxysmal atrial fibrillation: Secondary | ICD-10-CM | POA: Diagnosis not present

## 2016-07-30 MED ORDER — CARVEDILOL 12.5 MG PO TABS: 12.5000 mg | ORAL_TABLET | Freq: Two times a day (BID) | ORAL | 3 refills | Status: DC

## 2016-07-30 NOTE — Patient Instructions (Signed)
Medication Instructions:  Your physician recommends that you continue on your current medications as directed. Please refer to the Current Medication list given to you today.   Labwork: TODAY: BMET, CBC, TSH, LFTs, Lipids  Testing/Procedures: Your physician has requested that you have an echocardiogram. Echocardiography is a painless test that uses sound waves to create images of your heart. It provides your doctor with information about the size and shape of your heart and how well your heart's chambers and valves are working. This procedure takes approximately one hour. There are no restrictions for this procedure.  Follow-Up: Your physician wants you to follow-up in: 6 months with Dr. Radford Pax. You will receive a reminder letter in the mail two months in advance. If you don't receive a letter, please call our office to schedule the follow-up appointment.   Any Other Special Instructions Will Be Listed Below (If Applicable).     If you need a refill on your cardiac medications before your next appointment, please call your pharmacy.

## 2016-07-30 NOTE — Patient Instructions (Addendum)
Medication Instructions:   Your physician has recommended you make the following change in your medication: 1) INCREASE Carvedilol to 12.5 mg twice a day  --- If you need a refill on your cardiac medications before your next appointment, please call your pharmacy. ---  Labwork:  None ordered  Testing/Procedures:  None ordered  Follow-Up: Remote monitoring is used to monitor your Pacemaker of ICD from home. This monitoring reduces the number of office visits required to check your device to one time per year. It allows [place] to keep an eye on the functioning of your device to ensure it is working properly. You are scheduled for a device check from home on 10/29/2016. You may send your transmission at any time that day. If you have a wireless device, the transmission will be sent automatically. After your physician reviews your transmission, you will receive a postcard with your next transmission date.   Your physician wants you to follow-up in: 1 year with Dr. Curt Bears.  You will receive a reminder letter in the mail two months in advance. If you don't receive a letter, please call our office to schedule the follow-up appointment.  Thank you for choosing [facility]!!   Trinidad Curet, RN [phone]

## 2016-07-30 NOTE — Progress Notes (Signed)
Electrophysiology Office Note   Date:  07/30/2016   ID:  [name], DOB [birth year], MRN [id number]  PCP:  [name], MD  Cardiologist:  [name] Primary Electrophysiologist: Neidra Girvan Meredith Leeds, MD    Chief Complaint  Patient presents with  . Defib check    Chronic combined systolic and diastolic HF/PAF     History of Present Illness: [name] is a 83 y.o. male who presents today for electrophysiology evaluation.   He has a history of hypertension, aortic stenosis status post AVR, chronic combined systolic and diastolic CHF in the setting of A. fib with RVR, orthostatic hypotension, dilated cardiomyopathy EF 25-30%, status post CABG, and paroxysmal atrial fibrillation.  He does have paroxysmal atrial fibrillation has been cardioverted in the past and is now on a fixed within with rhythm control via amiodarone. He had CRT-D upgrade on 04/22/15.  He is feeling much better since his by the upgrade. He is feeling much better since his device upgrade. He has not required hospitalizations due to his heart failure. He says that he has figured out how much fluid to drink per day. He otherwise has no complaints of palpitations, chest pain, orthopnea, PND, edema, syncope, presyncope, or dizziness.   Past Medical History:  Diagnosis Date  . AICD (automatic cardioverter/defibrillator) present   . Aortic valve disorder 2009   severe AS s/p AVR with pericardial tissue valve  . Arthritis    "minor everywhere" (04/22/2015)  . Asthma    "a touch"  . Barrett esophagus   . CAD (coronary artery disease)    s/p 2 vessel CABG with LIMA to LAD, SVG to diagonal 1, SVG to RCA 12/09  . Cardiomyopathy ([place])    EF 15-20% by echo 2017  . Chronic combined systolic and diastolic CHF (congestive heart failure) (HCC)    dry weight 197-200lbs.  . CKD (chronic kidney disease)   . Diabetes ([place])    "I'm prediabetic" (04/22/2015)  . GERD (gastroesophageal reflux disease)   . H/O: rheumatic  fever   . Hepatitis 1957   "don't know what kind:  . History of hiatal hernia   . History of PFTs    a. Amiodarone started 5/16 >> PFTs w/ DLCO 6/16:  FEV1 72% predicted, FEV1/FVC 66%, DLCO 66% >> minimal reversible obstructive airways disease with mild diffusion defect (suggestive of emphysema but absence of hyperinflation inconsistent with dx)  . HTN (hypertension)   . Hypercholesteremia   . Hypotension   . Orthostatic hypotension   . Persistent atrial fibrillation ([place])    post op afib with no reoccurence  . Pneumonia 08/2014   "dr thought I may have had a touch"   Past Surgical History:  Procedure Laterality Date  . AORTIC VALVE REPLACEMENT  with 23-mm Magna Ease pericardial valve, model number   with 23-mm Magna Ease pericardial valve, model [id number], serial number [id number]   . APPENDECTOMY  1940s  . BI-VENTRICULAR IMPLANTABLE CARDIOVERTER DEFIBRILLATOR  (CRT-D)  04/22/2015  . CARDIAC CATHETERIZATION N/A 11/04/2014   Procedure: Left Heart Cath and Cors/Grafts Angiography;  Surgeon: Leonie Man, MD;  Location: [facility];  Service: Cardiovascular;  Laterality: N/A;  . CARDIAC VALVE REPLACEMENT    . CARDIOVERSION N/A 01/27/2013   Procedure: CARDIOVERSION;  Surgeon: Sueanne Margarita, MD;  Location: [facility];  Service: Cardiovascular;  Laterality: N/A;  . CARDIOVERSION N/A 08/25/2014   Procedure: CARDIOVERSION;  Surgeon: Sanda Klein, MD;  Location: [facility];  Service: Cardiovascular;  Laterality:  N/A;  . CATARACT EXTRACTION W/ INTRAOCULAR LENS  IMPLANT, BILATERAL Bilateral   . CORONARY ARTERY BYPASS GRAFT  03/10/2008   x 3 Dr. Roxan Hockey  . EP IMPLANTABLE DEVICE N/A 04/22/2015   Procedure: BiV ICD Insertion CRT-D;  Surgeon: Labron Bloodgood Meredith Leeds, MD;  Location: [facility];  Service: Cardiovascular;  Laterality: N/A;  . ESOPHAGOGASTRODUODENOSCOPY (EGD) WITH ESOPHAGEAL DILATION  X1  . TONSILLECTOMY       Current Outpatient Prescriptions  Medication Sig  Dispense Refill  . acetaminophen (TYLENOL) 500 MG tablet Take 500 mg by mouth every 6 (six) hours as needed for moderate pain.    Marland Kitchen amiodarone (PACERONE) 200 MG tablet TAKE 1 TABLET (200 MG TOTAL) BY MOUTH DAILY. 90 tablet 3  . apixaban (ELIQUIS) 2.5 MG TABS tablet Take 1 tablet (2.5 mg total) by mouth 2 (two) times daily. 180 tablet 3  . atorvastatin (LIPITOR) 20 MG tablet Take 1 tablet (20 mg total) by mouth daily at 6 PM. 90 tablet 3  . carvedilol (COREG) 12.5 MG tablet Take 1 tablet (12.5 mg total) by mouth 2 (two) times daily with a meal. 180 tablet 3  . furosemide (LASIX) 20 MG tablet Take 1 tablet (20 mg total) by mouth daily. 90 tablet 3  . hydrALAZINE (APRESOLINE) 25 MG tablet Take 0.5 tablets (12.5 mg total) by mouth 2 (two) times daily before a meal. 270 tablet 3  . isosorbide mononitrate (IMDUR) 30 MG 24 hr tablet Take 1 tablet (30 mg total) by mouth daily. 90 tablet 3  . Magnesium 250 MG TABS Take 1 tablet by mouth every evening.     . Multiple Vitamin (MULTIVITAMIN) capsule Take 1 capsule by mouth daily.    Marland Kitchen omega-3 acid ethyl esters (LOVAZA) 1 G capsule Take 2 g by mouth 2 (two) times daily.    . ranitidine (ZANTAC) 300 MG tablet Take 300 mg by mouth at bedtime.    . traMADol (ULTRAM) 50 MG tablet Take 1 tablet by mouth as directed.     No current facility-administered medications for this visit.     Allergies:   Novocain [procaine]   Social History:  The patient  reports that he has never smoked. He has never used smokeless tobacco. He reports that he drinks alcohol. He reports that he does not use drugs.   Family History:  The patient's family history includes Alzheimer's disease in his mother; COPD in his daughter and sister; Depression in his sister; Esophageal cancer in his sister; Heart disease in his sister; Hyperlipidemia in his sister; Hypertension in his sister.    ROS:  Please see the history of present illness.   Otherwise, review of systems is positive for none.    All other systems are reviewed and negative.   PHYSICAL EXAM: VS:  BP (!) 146/82   Pulse 86   Ht 6' (1.829 m)   Wt 210 lb (95.3 kg)   SpO2 96%   BMI 28.48 kg/m  , BMI Body mass index is 28.48 kg/m. GEN: Well nourished, well developed, in no acute distress  HEENT: normal  Neck: no JVD, carotid bruits, or masses Cardiac: RRR; no murmurs, rubs, or gallops,no edema  Respiratory:  clear to auscultation bilaterally, normal work of breathing GI: soft, nontender, nondistended, + BS MS: no deformity or atrophy  Skin: warm and dry Neuro:  Strength and sensation are intact Psych: euthymic mood, full affect   EKG:  EKG is ordered today. Personal review of the EKG ordered shows  AV pacing  Device interrogation performed today in clinic.  See PaceArt for details.   Recent Labs: No results found for requested labs within last 8760 hours.    Lipid Panel     Component Value Date/Time   CHOL 123 (L) 01/19/2015 1005   CHOL 93 (L) 06/16/2014 0914   TRIG 161 (H) 01/19/2015 1005   TRIG 124 06/16/2014 0914   HDL 45 01/19/2015 1005   HDL 34 (L) 06/16/2014 0914   CHOLHDL 2.7 01/19/2015 1005   VLDL 32 (H) 01/19/2015 1005   LDLCALC 46 01/19/2015 1005   LDLCALC 34 06/16/2014 0914     Wt Readings from Last 3 Encounters:  07/30/16 210 lb (95.3 kg)  07/30/16 210 lb (95.3 kg)  04/18/16 213 lb (96.6 kg)      Other studies Reviewed: Additional studies/ records that were reviewed today include: TTE 02/02/15  Review of the above records today demonstrates:  - Left ventricle: The cavity size was mildly dilated. Systolic function was severely reduced. The estimated ejection fraction was in the range of 25% to 30%. There is akinesis of the basal-midinferoseptal myocardium. There is akinesis of the entireapical myocardium. There is akinesis of the mid-apicallateral myocardium. There is akinesis of the mid-apicalinferior myocardium. There is severe hypokinesis of  the entireinferolateral myocardium. There is akinesis of the apicalanterior myocardium. There is akinesis of the midanteroseptal myocardium. Features are consistent with a pseudonormal left ventricular filling pattern, with concomitant abnormal relaxation and increased filling pressure (grade 2 diastolic dysfunction). Doppler parameters are consistent with high ventricular filling pressure. - Aortic valve: Poorly visualized. A bioprosthesis was present and functioning normally. - Mitral valve: Calcified annulus. Mild diffuse calcification of the anterior leaflet. There was mild regurgitation. - Left atrium: The atrium was severely dilated. - Tricuspid valve: There was trivial regurgitation.   ASSESSMENT AND PLAN:  1.  Chronic combined systolic and diastolic congestive heart failure: On carvedilol, indoor, and hydralazine. Unfortunately not able to tolerate Ace inhibitors or beta blockers due to kidney disease. He is well compensated. Device interrogation showed no evidence of fluid overload. His blood pressure is mildly elevated today. We'll increase his carvedilol to 12.5 mg.  2. Paroxysmal atrial fibrillation: Currently on Eliquis and amiodarone. No arrhythmias noted on his device interrogation. We'll plan for amiodarone monitoring labs today.  This patients CHA2DS2-VASc Score and unadjusted Ischemic Stroke Rate (% per year) is equal to 7.2 % stroke rate/year from a score of 5  Above score calculated as 1 point each if present [CHF, HTN, DM, Vascular=MI/PAD/Aortic Plaque, Age if 65-74, or Male] Above score calculated as 2 points each if present [Age > 75, or Stroke/TIA/TE]  Current medicines are reviewed at length with the patient today.   The patient does not have concerns regarding his medicines.  The following changes were made today: Increase carvedilol to 12.5 mg  Labs/ tests ordered today include: ECG No orders of the defined types were placed in this  encounter.    Disposition:   FU with Prosperity Darrough 12 months  Signed, all of Kraig Genis Meredith Leeds, MD  07/30/2016 11:26 AM     [facility] [address] [place] [address] [phone] (office) [phone] (fax)

## 2016-07-31 LAB — HEPATIC FUNCTION PANEL
ALBUMIN: 4.3 g/dL (ref 3.5–4.7)
ALK PHOS: 46 IU/L (ref 39–117)
ALT: 41 IU/L (ref 0–44)
AST: 33 IU/L (ref 0–40)
BILIRUBIN TOTAL: 0.8 mg/dL (ref 0.0–1.2)
Bilirubin, Direct: 0.2 mg/dL (ref 0.00–0.40)
Total Protein: 6.7 g/dL (ref 6.0–8.5)

## 2016-07-31 LAB — BASIC METABOLIC PANEL
BUN/Creatinine Ratio: 15 (ref 10–24)
BUN: 27 mg/dL (ref 8–27)
CALCIUM: 9 mg/dL (ref 8.6–10.2)
CHLORIDE: 101 mmol/L (ref 96–106)
CO2: 20 mmol/L (ref 18–29)
CREATININE: 1.75 mg/dL — AB (ref 0.76–1.27)
GFR calc non Af Amer: 36 mL/min/{1.73_m2} — ABNORMAL LOW (ref >59)
GFR, EST AFRICAN AMERICAN: 41 mL/min/{1.73_m2} — AB (ref >59)
Glucose: 122 mg/dL — ABNORMAL HIGH (ref 65–99)
Potassium: 4.5 mmol/L (ref 3.5–5.2)
Sodium: 141 mmol/L (ref 134–144)

## 2016-07-31 LAB — LIPID PANEL
CHOL/HDL RATIO: 2.8 ratio (ref 0.0–5.0)
Cholesterol, Total: 126 mg/dL (ref 100–199)
HDL: 45 mg/dL (ref >39)
LDL Calculated: 50 mg/dL (ref 0–99)
Triglycerides: 154 mg/dL — ABNORMAL HIGH (ref 0–149)
VLDL Cholesterol Cal: 31 mg/dL (ref 5–40)

## 2016-07-31 LAB — CBC WITH DIFFERENTIAL/PLATELET
Basophils Absolute: 0 10*3/uL (ref 0.0–0.2)
Basos: 0 %
EOS (ABSOLUTE): 0.1 10*3/uL (ref 0.0–0.4)
EOS: 2 %
HEMATOCRIT: 44.3 % (ref 37.5–51.0)
Hemoglobin: 14.7 g/dL (ref 13.0–17.7)
IMMATURE GRANS (ABS): 0 10*3/uL (ref 0.0–0.1)
Immature Granulocytes: 0 %
LYMPHS: 29 %
Lymphocytes Absolute: 2.2 10*3/uL (ref 0.7–3.1)
MCH: 30.5 pg (ref 26.6–33.0)
MCHC: 33.2 g/dL (ref 31.5–35.7)
MCV: 92 fL (ref 79–97)
MONOCYTES: 14 %
Monocytes Absolute: 1.1 10*3/uL — ABNORMAL HIGH (ref 0.1–0.9)
NEUTROS ABS: 4.3 10*3/uL (ref 1.4–7.0)
Neutrophils: 55 %
Platelets: 226 10*3/uL (ref 150–379)
RBC: 4.82 x10E6/uL (ref 4.14–5.80)
RDW: 14.2 % (ref 12.3–15.4)
WBC: 7.9 10*3/uL (ref 3.4–10.8)

## 2016-07-31 LAB — TSH: TSH: 1.92 u[IU]/mL (ref 0.450–4.500)

## 2016-08-07 LAB — CUP PACEART INCLINIC DEVICE CHECK
Battery Remaining Longevity: 84 mo
Brady Statistic AP VS Percent: 0.54 %
Brady Statistic RA Percent Paced: 94.48 %
Brady Statistic RV Percent Paced: 98.42 %
Date Time Interrogation Session: 20180423151527
HighPow Impedance: 76 Ohm
Implantable Lead Implant Date: 20170113
Implantable Lead Implant Date: 20170113
Implantable Lead Location: 753858
Implantable Lead Location: 753860
Implantable Lead Model: 4298
Lead Channel Impedance Value: 342 Ohm
Lead Channel Impedance Value: 342 Ohm
Lead Channel Impedance Value: 399 Ohm
Lead Channel Impedance Value: 437 Ohm
Lead Channel Impedance Value: 494 Ohm
Lead Channel Impedance Value: 494 Ohm
Lead Channel Impedance Value: 627 Ohm
Lead Channel Pacing Threshold Amplitude: 0.5 V
Lead Channel Pacing Threshold Pulse Width: 0.4 ms
Lead Channel Sensing Intrinsic Amplitude: 12 mV
Lead Channel Sensing Intrinsic Amplitude: 12.375 mV
Lead Channel Sensing Intrinsic Amplitude: 4.25 mV
Lead Channel Setting Pacing Amplitude: 1.5 V
Lead Channel Setting Pacing Amplitude: 2 V
Lead Channel Setting Pacing Pulse Width: 0.4 ms
Lead Channel Setting Sensing Sensitivity: 0.3 mV
MDC IDC LEAD IMPLANT DT: 20170113
MDC IDC LEAD LOCATION: 753859
MDC IDC MSMT BATTERY VOLTAGE: 2.99 V
MDC IDC MSMT LEADCHNL LV IMPEDANCE VALUE: 342 Ohm
MDC IDC MSMT LEADCHNL LV IMPEDANCE VALUE: 589 Ohm
MDC IDC MSMT LEADCHNL LV IMPEDANCE VALUE: 722 Ohm
MDC IDC MSMT LEADCHNL LV IMPEDANCE VALUE: 722 Ohm
MDC IDC MSMT LEADCHNL LV IMPEDANCE VALUE: 760 Ohm
MDC IDC MSMT LEADCHNL LV PACING THRESHOLD AMPLITUDE: 0.375 V
MDC IDC MSMT LEADCHNL LV PACING THRESHOLD PULSEWIDTH: 0.4 ms
MDC IDC MSMT LEADCHNL RA PACING THRESHOLD AMPLITUDE: 0.75 V
MDC IDC MSMT LEADCHNL RA PACING THRESHOLD PULSEWIDTH: 0.4 ms
MDC IDC MSMT LEADCHNL RA SENSING INTR AMPL: 2.875 mV
MDC IDC MSMT LEADCHNL RV IMPEDANCE VALUE: 399 Ohm
MDC IDC PG IMPLANT DT: 20170113
MDC IDC SET LEADCHNL LV PACING AMPLITUDE: 1.5 V
MDC IDC SET LEADCHNL LV PACING PULSEWIDTH: 0.4 ms
MDC IDC STAT BRADY AP VP PERCENT: 94.75 %
MDC IDC STAT BRADY AS VP PERCENT: 4.55 %
MDC IDC STAT BRADY AS VS PERCENT: 0.16 %

## 2016-08-09 ENCOUNTER — Other Ambulatory Visit: Payer: Self-pay

## 2016-08-09 ENCOUNTER — Ambulatory Visit (HOSPITAL_COMMUNITY): Payer: Medicare HMO | Attending: Cardiology

## 2016-08-09 DIAGNOSIS — I340 Nonrheumatic mitral (valve) insufficiency: Secondary | ICD-10-CM | POA: Diagnosis not present

## 2016-08-09 DIAGNOSIS — I5030 Unspecified diastolic (congestive) heart failure: Secondary | ICD-10-CM | POA: Insufficient documentation

## 2016-08-09 DIAGNOSIS — I420 Dilated cardiomyopathy: Secondary | ICD-10-CM | POA: Diagnosis not present

## 2016-08-09 DIAGNOSIS — Z953 Presence of xenogenic heart valve: Secondary | ICD-10-CM | POA: Diagnosis not present

## 2016-08-09 DIAGNOSIS — I350 Nonrheumatic aortic (valve) stenosis: Secondary | ICD-10-CM

## 2016-08-09 MED ORDER — PERFLUTREN LIPID MICROSPHERE
1.0000 mL | INTRAVENOUS | Status: AC | PRN
  Administered 2016-08-09: 2 mL via INTRAVENOUS

## 2016-08-15 DIAGNOSIS — E1121 Type 2 diabetes mellitus with diabetic nephropathy: Secondary | ICD-10-CM | POA: Diagnosis not present

## 2016-08-15 DIAGNOSIS — I4891 Unspecified atrial fibrillation: Secondary | ICD-10-CM | POA: Diagnosis not present

## 2016-08-15 DIAGNOSIS — I509 Heart failure, unspecified: Secondary | ICD-10-CM | POA: Diagnosis not present

## 2016-08-15 DIAGNOSIS — I10 Essential (primary) hypertension: Secondary | ICD-10-CM | POA: Diagnosis not present

## 2016-08-15 DIAGNOSIS — I2510 Atherosclerotic heart disease of native coronary artery without angina pectoris: Secondary | ICD-10-CM | POA: Diagnosis not present

## 2016-08-15 DIAGNOSIS — N183 Chronic kidney disease, stage 3 (moderate): Secondary | ICD-10-CM | POA: Diagnosis not present

## 2016-08-15 DIAGNOSIS — I255 Ischemic cardiomyopathy: Secondary | ICD-10-CM | POA: Diagnosis not present

## 2016-08-15 DIAGNOSIS — K2270 Barrett's esophagus without dysplasia: Secondary | ICD-10-CM | POA: Diagnosis not present

## 2016-08-15 DIAGNOSIS — E7800 Pure hypercholesterolemia, unspecified: Secondary | ICD-10-CM | POA: Diagnosis not present

## 2016-08-20 ENCOUNTER — Encounter: Payer: Self-pay | Admitting: Pharmacist

## 2016-08-20 ENCOUNTER — Ambulatory Visit (INDEPENDENT_AMBULATORY_CARE_PROVIDER_SITE_OTHER): Payer: Medicare HMO | Admitting: Pharmacist

## 2016-08-20 VITALS — BP 144/80 | HR 65

## 2016-08-20 DIAGNOSIS — I10 Essential (primary) hypertension: Secondary | ICD-10-CM | POA: Diagnosis not present

## 2016-08-20 NOTE — Progress Notes (Signed)
Patient ID: [name]                 DOB: Jun 15, 1932                      MRN: [id number]     HPI: [name] is a 83 y.o. male patient of Dr. Radford Pax and Dr. Curt Bears who presents today for hypertension evaluation.  PMH includes HTN, AS s/p AVR, chronic combined systolic/diastolic CHF in setting of Afib with RVR, orthostatic hypotension,  DCM EF 25-30%, ASCAD s/p CABG and PAF.At his most recent OV with Dr. Curt Bears his carvedilol was increased to 12.45m BID.   He presents today and states that he did not increase his dose of carvedilol because Dr. TRadford Paxhad said keep everything the same. He denies any SOB, chest pain, or headaches. He does state he occasionally has some dizziness when he stands up.   He reports he was previously on higher doses of hydralazine and this was decreased due to low blood pressures.   Current HTN meds:  Imdur 330mdaily Furosemide 2046maily  Hydralazine 12.5mg14mD Carvedilol 6.25mg33m (6am and 6pm)  BP goal: <140/90 (given age)  Family History: The patient's family history includes Alzheimer's disease in his mother; COPD in his daughter and sister; Depression in his sister; Esophageal cancer in his sister; Heart disease in his sister; Hyperlipidemia in his sister; Hypertension in his sister.   Social History: Denies tobacco products and alcohol.   Diet: Most meals prepared from home. Avoids salts. No coffee, tea, soda.   Exercise: No formal exercise. He is active at work.   Home BP readings: has cuff but has not been checking. He states it runs about 140/80.   Wt Readings from Last 3 Encounters:  07/30/16 210 lb (95.3 kg)  07/30/16 210 lb (95.3 kg)  04/18/16 213 lb (96.6 kg)   BP Readings from Last 3 Encounters:  08/20/16 (!) 144/80  07/30/16 (!) 146/82  07/30/16 (!) 146/82   Pulse Readings from Last 3 Encounters:  08/20/16 65  07/30/16 86  07/30/16 86    Renal function: CrCl cannot be calculated (Patient's most recent lab result  is older than the maximum 21 days allowed.).  Past Medical History:  Diagnosis Date  . AICD (automatic cardioverter/defibrillator) present   . Aortic valve disorder 2009   severe AS s/p AVR with pericardial tissue valve  . Arthritis    "minor everywhere" (04/22/2015)  . Asthma    "a touch"  . Barrett esophagus   . CAD (coronary artery disease)    s/p 2 vessel CABG with LIMA to LAD, SVG to diagonal 1, SVG to RCA 12/09  . Cardiomyopathy (HCC) [place] 2017  . Chronic combined systolic and diastolic CHF (congestive heart failure) (HCC)    dry weight 197-200lbs.  . CKD (chronic kidney disease)   . Diabetes (HCC) [place]"I'm prediabetic" (04/22/2015)  . GERD (gastroesophageal reflux disease)   . H/O: rheumatic fever   . Hepatitis 1957   "don't know what kind:  . History of hiatal hernia   . History of PFTs    a. Amiodarone started 5/16 >> PFTs w/ DLCO 6/16:  FEV1 72% predicted, FEV1/FVC 66%, DLCO 66% >> minimal reversible obstructive airways disease with mild diffusion defect (suggestive of emphysema but absence of hyperinflation inconsistent with dx)  . HTN (hypertension)   . Hypercholesteremia   . Hypotension   .  Orthostatic hypotension   . Persistent atrial fibrillation ([place])    post op afib with no reoccurence  . Pneumonia 08/2014   "dr thought I may have had a touch"    Current Outpatient Prescriptions on File Prior to Visit  Medication Sig Dispense Refill  . acetaminophen (TYLENOL) 500 MG tablet Take 500 mg by mouth every 6 (six) hours as needed for moderate pain.    Marland Kitchen amiodarone (PACERONE) 200 MG tablet TAKE 1 TABLET (200 MG TOTAL) BY MOUTH DAILY. 90 tablet 3  . apixaban (ELIQUIS) 2.5 MG TABS tablet Take 1 tablet (2.5 mg total) by mouth 2 (two) times daily. 180 tablet 3  . atorvastatin (LIPITOR) 20 MG tablet Take 1 tablet (20 mg total) by mouth daily at 6 PM. 90 tablet 3  . carvedilol (COREG) 12.5 MG tablet Take 1 tablet (12.5 mg total) by mouth 2 (two) times daily  with a meal. 180 tablet 3  . furosemide (LASIX) 20 MG tablet Take 1 tablet (20 mg total) by mouth daily. 90 tablet 3  . hydrALAZINE (APRESOLINE) 25 MG tablet Take 0.5 tablets (12.5 mg total) by mouth 2 (two) times daily before a meal. 270 tablet 3  . isosorbide mononitrate (IMDUR) 30 MG 24 hr tablet Take 1 tablet (30 mg total) by mouth daily. 90 tablet 3  . Magnesium 250 MG TABS Take 1 tablet by mouth every evening.     . Multiple Vitamin (MULTIVITAMIN) capsule Take 1 capsule by mouth daily.    Marland Kitchen omega-3 acid ethyl esters (LOVAZA) 1 G capsule Take 2 g by mouth 2 (two) times daily.    . ranitidine (ZANTAC) 300 MG tablet Take 300 mg by mouth at bedtime.    . traMADol (ULTRAM) 50 MG tablet Take 1 tablet by mouth as directed.     No current facility-administered medications on file prior to visit.     Allergies  Allergen Reactions  . Novocain [Procaine] Swelling    Blood pressure (!) 144/80, pulse 65.   Assessment/Plan: Hypertension: BP borderline at goal today. Will continue all medications as prescribed. Advised to call if pressure trends up. Continue current regimen. Follow up with Dr. Radford Pax as scheduled and HTN clinic as needed.    Thank you, Lelan Pons. Patterson Hammersmith, [facility]  08/20/2016 12:58 PM

## 2016-08-20 NOTE — Patient Instructions (Addendum)
Monitor pressures if trend above 140/90 call [phone] to be seen in blood pressure clinic.   Check your blood pressure at home daily (if able) and keep record of the readings.  Take your BP meds as follows: CONTINUE all medications as prescribed  Bring all of your meds, your BP cuff and your record of home blood pressures to your next appointment.  Exercise as you're able, try to walk approximately 30 minutes per day.  Keep salt intake to a minimum, especially watch canned and prepared boxed foods.  Eat more fresh fruits and vegetables and fewer canned items.  Avoid eating in fast food restaurants.    HOW TO TAKE YOUR BLOOD PRESSURE: . Rest 5 minutes before taking your blood pressure. .  Don't smoke or drink caffeinated beverages for at least 30 minutes before. . Take your blood pressure before (not after) you eat. . Sit comfortably with your back supported and both feet on the floor (don't cross your legs). . Elevate your arm to heart level on a table or a desk. . Use the proper sized cuff. It should fit smoothly and snugly around your bare upper arm. There should be enough room to slip a fingertip under the cuff. The bottom edge of the cuff should be 1 inch above the crease of the elbow. . Ideally, take 3 measurements at one sitting and record the average.

## 2016-08-28 DIAGNOSIS — Z79899 Other long term (current) drug therapy: Secondary | ICD-10-CM | POA: Diagnosis not present

## 2016-08-28 DIAGNOSIS — H18003 Unspecified corneal deposit, bilateral: Secondary | ICD-10-CM | POA: Diagnosis not present

## 2016-08-28 DIAGNOSIS — E119 Type 2 diabetes mellitus without complications: Secondary | ICD-10-CM | POA: Diagnosis not present

## 2016-09-26 ENCOUNTER — Other Ambulatory Visit: Payer: Self-pay | Admitting: Cardiology

## 2016-09-26 DIAGNOSIS — E785 Hyperlipidemia, unspecified: Secondary | ICD-10-CM

## 2016-09-26 DIAGNOSIS — I2510 Atherosclerotic heart disease of native coronary artery without angina pectoris: Secondary | ICD-10-CM

## 2016-09-26 DIAGNOSIS — I350 Nonrheumatic aortic (valve) stenosis: Secondary | ICD-10-CM

## 2016-09-26 DIAGNOSIS — I2583 Coronary atherosclerosis due to lipid rich plaque: Principal | ICD-10-CM

## 2016-09-26 DIAGNOSIS — I5042 Chronic combined systolic (congestive) and diastolic (congestive) heart failure: Secondary | ICD-10-CM

## 2016-09-26 DIAGNOSIS — I10 Essential (primary) hypertension: Secondary | ICD-10-CM

## 2016-09-26 DIAGNOSIS — I480 Paroxysmal atrial fibrillation: Secondary | ICD-10-CM

## 2016-10-29 ENCOUNTER — Encounter: Payer: Medicare HMO | Admitting: *Deleted

## 2016-11-01 ENCOUNTER — Other Ambulatory Visit: Payer: Self-pay | Admitting: Cardiology

## 2016-11-02 ENCOUNTER — Encounter: Payer: Self-pay | Admitting: Cardiology

## 2016-11-02 NOTE — Telephone Encounter (Signed)
Request received Eliquis 2.16m; pt is 83yrs old, Wt-95.3kg, Crea-1.75 on 07/30/16, last seen by Dr. CCurt Bearson 07/30/16; will send in refill request to requested pharmacy.

## 2016-11-07 ENCOUNTER — Ambulatory Visit (INDEPENDENT_AMBULATORY_CARE_PROVIDER_SITE_OTHER): Payer: Medicare HMO | Admitting: *Deleted

## 2016-11-07 DIAGNOSIS — I255 Ischemic cardiomyopathy: Secondary | ICD-10-CM | POA: Diagnosis not present

## 2016-11-08 ENCOUNTER — Encounter: Payer: Self-pay | Admitting: Cardiology

## 2016-11-08 NOTE — Progress Notes (Signed)
Remote ICD transmission.   

## 2016-11-09 LAB — CUP PACEART REMOTE DEVICE CHECK
Battery Voltage: 2.99 V
Brady Statistic AP VP Percent: 96.35 %
Brady Statistic RV Percent Paced: 98.73 %
Date Time Interrogation Session: 20180801171713
HIGH POWER IMPEDANCE MEASURED VALUE: 70 Ohm
Implantable Lead Implant Date: 20170113
Implantable Lead Location: 753859
Implantable Lead Model: 4298
Lead Channel Impedance Value: 380 Ohm
Lead Channel Impedance Value: 380 Ohm
Lead Channel Impedance Value: 494 Ohm
Lead Channel Impedance Value: 570 Ohm
Lead Channel Impedance Value: 722 Ohm
Lead Channel Pacing Threshold Amplitude: 0.375 V
Lead Channel Pacing Threshold Amplitude: 0.5 V
Lead Channel Pacing Threshold Amplitude: 0.875 V
Lead Channel Pacing Threshold Pulse Width: 0.4 ms
Lead Channel Setting Pacing Amplitude: 1.5 V
Lead Channel Setting Pacing Amplitude: 2 V
Lead Channel Setting Pacing Pulse Width: 0.4 ms
Lead Channel Setting Sensing Sensitivity: 0.3 mV
MDC IDC LEAD IMPLANT DT: 20170113
MDC IDC LEAD IMPLANT DT: 20170113
MDC IDC LEAD LOCATION: 753858
MDC IDC LEAD LOCATION: 753860
MDC IDC MSMT BATTERY REMAINING LONGEVITY: 78 mo
MDC IDC MSMT LEADCHNL LV IMPEDANCE VALUE: 323 Ohm
MDC IDC MSMT LEADCHNL LV IMPEDANCE VALUE: 380 Ohm
MDC IDC MSMT LEADCHNL LV IMPEDANCE VALUE: 399 Ohm
MDC IDC MSMT LEADCHNL LV IMPEDANCE VALUE: 589 Ohm
MDC IDC MSMT LEADCHNL LV IMPEDANCE VALUE: 703 Ohm
MDC IDC MSMT LEADCHNL LV IMPEDANCE VALUE: 722 Ohm
MDC IDC MSMT LEADCHNL LV PACING THRESHOLD PULSEWIDTH: 0.4 ms
MDC IDC MSMT LEADCHNL RA IMPEDANCE VALUE: 456 Ohm
MDC IDC MSMT LEADCHNL RA SENSING INTR AMPL: 2.625 mV
MDC IDC MSMT LEADCHNL RA SENSING INTR AMPL: 2.625 mV
MDC IDC MSMT LEADCHNL RV IMPEDANCE VALUE: 456 Ohm
MDC IDC MSMT LEADCHNL RV PACING THRESHOLD PULSEWIDTH: 0.4 ms
MDC IDC MSMT LEADCHNL RV SENSING INTR AMPL: 12.25 mV
MDC IDC MSMT LEADCHNL RV SENSING INTR AMPL: 12.25 mV
MDC IDC PG IMPLANT DT: 20170113
MDC IDC SET LEADCHNL RA PACING AMPLITUDE: 1.75 V
MDC IDC SET LEADCHNL RV PACING PULSEWIDTH: 0.4 ms
MDC IDC STAT BRADY AP VS PERCENT: 0.73 %
MDC IDC STAT BRADY AS VP PERCENT: 2.85 %
MDC IDC STAT BRADY AS VS PERCENT: 0.07 %
MDC IDC STAT BRADY RA PERCENT PACED: 96.56 %

## 2016-11-17 ENCOUNTER — Emergency Department (HOSPITAL_COMMUNITY)
Admission: EM | Admit: 2016-11-17 | Discharge: 2016-11-17 | Disposition: A | Discharge status: Home / Self Care | Payer: Medicare HMO | Attending: Emergency Medicine | Admitting: Emergency Medicine

## 2016-11-17 ENCOUNTER — Encounter (HOSPITAL_COMMUNITY): Payer: Self-pay

## 2016-11-17 DIAGNOSIS — I130 Hypertensive heart and chronic kidney disease with heart failure and stage 1 through stage 4 chronic kidney disease, or unspecified chronic kidney disease: Secondary | ICD-10-CM | POA: Diagnosis not present

## 2016-11-17 DIAGNOSIS — J45909 Unspecified asthma, uncomplicated: Secondary | ICD-10-CM | POA: Diagnosis not present

## 2016-11-17 DIAGNOSIS — I2510 Atherosclerotic heart disease of native coronary artery without angina pectoris: Secondary | ICD-10-CM | POA: Diagnosis not present

## 2016-11-17 DIAGNOSIS — E119 Type 2 diabetes mellitus without complications: Secondary | ICD-10-CM | POA: Diagnosis not present

## 2016-11-17 DIAGNOSIS — Z79899 Other long term (current) drug therapy: Secondary | ICD-10-CM | POA: Diagnosis not present

## 2016-11-17 DIAGNOSIS — I509 Heart failure, unspecified: Secondary | ICD-10-CM | POA: Insufficient documentation

## 2016-11-17 DIAGNOSIS — N183 Chronic kidney disease, stage 3 (moderate): Secondary | ICD-10-CM | POA: Insufficient documentation

## 2016-11-17 DIAGNOSIS — L03116 Cellulitis of left lower limb: Secondary | ICD-10-CM | POA: Diagnosis not present

## 2016-11-17 DIAGNOSIS — R2242 Localized swelling, mass and lump, left lower limb: Secondary | ICD-10-CM | POA: Diagnosis present

## 2016-11-17 MED ORDER — CEPHALEXIN 500 MG PO CAPS: 500.0000 mg | ORAL_CAPSULE | Freq: Four times a day (QID) | ORAL | 0 refills | Status: DC

## 2016-11-17 MED ORDER — CEPHALEXIN 250 MG PO CAPS
1000.0000 mg | ORAL_CAPSULE | Freq: Once | ORAL | Status: AC
  Administered 2016-11-17: 1000 mg via ORAL
  Filled 2016-11-17: qty 4

## 2016-11-17 NOTE — ED Provider Notes (Signed)
[facility] DEPT Provider Note   CSN: [id number] Arrival date & time: 11/17/16  1921     History   Chief Complaint Chief Complaint  Patient presents with  . Leg Swelling    HPI [name] is a 83 y.o. male.  Patient c/o left lower leg redness above ankle, same as when had cellulitis previously.  Symptoms start in past day, acute onset, constant, persistent, moderate. Otherwise, does not feel ill.  No weakness. No nausea or vomiting. No fever or chills. No cp or sob.    The history is provided by the patient.    Past Medical History:  Diagnosis Date  . AICD (automatic cardioverter/defibrillator) present   . Aortic valve disorder 2009   severe AS s/p AVR with pericardial tissue valve  . Arthritis    "minor everywhere" (04/22/2015)  . Asthma    "a touch"  . Barrett esophagus   . CAD (coronary artery disease)    s/p 2 vessel CABG with LIMA to LAD, SVG to diagonal 1, SVG to RCA 12/09  . Cardiomyopathy ([place])    EF 15-20% by echo 2017  . Chronic combined systolic and diastolic CHF (congestive heart failure) (HCC)    dry weight 197-200lbs.  . CKD (chronic kidney disease)   . Diabetes ([place])    "I'm prediabetic" (04/22/2015)  . GERD (gastroesophageal reflux disease)   . H/O: rheumatic fever   . Hepatitis 1957   "don't know what kind:  . History of hiatal hernia   . History of PFTs    a. Amiodarone started 5/16 >> PFTs w/ DLCO 6/16:  FEV1 72% predicted, FEV1/FVC 66%, DLCO 66% >> minimal reversible obstructive airways disease with mild diffusion defect (suggestive of emphysema but absence of hyperinflation inconsistent with dx)  . HTN (hypertension)   . Hypercholesteremia   . Hypotension   . Orthostatic hypotension   . Persistent atrial fibrillation ([place])    post op afib with no reoccurence  . Pneumonia 08/2014   "dr thought I may have had a touch"    Patient Active Problem List   Diagnosis Date Noted  . Abnormal nuclear stress test: HIGH RISK 11/04/2014  .  Cardiomyopathy, ischemic: EF roughly 30% 11/04/2014  . S/P AVR (aortic valve replacement) 09/02/2014  . Chronic combined systolic and diastolic heart failure ([place])   . Shingles 08/23/2014  . CKD (chronic kidney disease) stage 3, GFR 30-59 ml/min 08/23/2014  . Hyperkalemia 08/23/2014  . Aortic stenosis, severe 01/23/2013  . Essential hypertension 01/23/2013  . Dyslipidemia 01/23/2013  . CAD status post CABG 01/23/2013  . Diastolic dysfunction 48/54/6270  . Persistent atrial fibrillation ([place]) 01/23/2013  . Chronic anticoagulation 01/23/2013    Past Surgical History:  Procedure Laterality Date  . AORTIC VALVE REPLACEMENT  with 23-mm Magna Ease pericardial valve, model number   with 23-mm Magna Ease pericardial valve, model [id number], serial number [id number]   . APPENDECTOMY  1940s  . BI-VENTRICULAR IMPLANTABLE CARDIOVERTER DEFIBRILLATOR  (CRT-D)  04/22/2015  . CARDIAC CATHETERIZATION N/A 11/04/2014   Procedure: Left Heart Cath and Cors/Grafts Angiography;  Surgeon: Leonie Man, MD;  Location: [facility];  Service: Cardiovascular;  Laterality: N/A;  . CARDIAC VALVE REPLACEMENT    . CARDIOVERSION N/A 01/27/2013   Procedure: CARDIOVERSION;  Surgeon: Sueanne Margarita, MD;  Location: [facility];  Service: Cardiovascular;  Laterality: N/A;  . CARDIOVERSION N/A 08/25/2014   Procedure: CARDIOVERSION;  Surgeon: Sanda Klein, MD;  [facility];  Service: Cardiovascular;  Laterality: N/A;  .  CATARACT EXTRACTION W/ INTRAOCULAR LENS  IMPLANT, BILATERAL Bilateral   . CORONARY ARTERY BYPASS GRAFT  03/10/2008   x 3 Dr. Roxan Hockey  . EP IMPLANTABLE DEVICE N/A 04/22/2015   Procedure: BiV ICD Insertion CRT-D;  Surgeon: [name], MD;  Location: [facility];  Service: Cardiovascular;  Laterality: N/A;  . ESOPHAGOGASTRODUODENOSCOPY (EGD) WITH ESOPHAGEAL DILATION  X1  . TONSILLECTOMY         Home Medications    Prior to Admission medications   Medication Sig  Start Date End Date Taking? Authorizing Provider  acetaminophen (TYLENOL) 500 MG tablet Take 500 mg by mouth every 6 (six) hours as needed for moderate pain.    [provider]  amiodarone (PACERONE) 200 MG tablet TAKE 1 TABLET (200 MG TOTAL) BY MOUTH DAILY. 07/24/16   Sueanne Margarita, MD  atorvastatin (LIPITOR) 20 MG tablet TAKE 1 TABLET (20 MG TOTAL) DAILY AT 6 PM. 09/27/16   Turner, Eber Hong, MD  carvedilol (COREG) 12.5 MG tablet Take 1 tablet (12.5 mg total) by mouth 2 (two) times daily with a meal. 07/30/16   Camnitz, Will Hassell Done, MD  ELIQUIS 2.5 MG TABS tablet TAKE 1 TABLET TWICE DAILY 11/02/16   Sueanne Margarita, MD  furosemide (LASIX) 20 MG tablet TAKE 1 TABLET (20 MG TOTAL) BY MOUTH DAILY. 09/27/16   Sueanne Margarita, MD  hydrALAZINE (APRESOLINE) 25 MG tablet Take 0.5 tablets (12.5 mg total) by mouth 2 (two) times daily before a meal. 02/16/15   Turner, Eber Hong, MD  isosorbide mononitrate (IMDUR) 30 MG 24 hr tablet TAKE 1 TABLET (30 MG TOTAL) BY MOUTH DAILY. 09/27/16   Sueanne Margarita, MD  Magnesium 250 MG TABS Take 1 tablet by mouth every evening.     [provider]  Multiple Vitamin (MULTIVITAMIN) capsule Take 1 capsule by mouth daily.    [provider]  omega-3 acid ethyl esters (LOVAZA) 1 G capsule Take 2 g by mouth 2 (two) times daily.    [provider]  ranitidine (ZANTAC) 300 MG tablet Take 300 mg by mouth at bedtime.    [provider]  traMADol (ULTRAM) 50 MG tablet Take 1 tablet by mouth as directed. 11/28/15   [provider]    Family History Family History  Problem Relation Age of Onset  . Alzheimer's disease Mother   . COPD Daughter   . Alzheimer's disease Unknown   . COPD Sister   . Depression Sister   . Heart disease Sister   . Hyperlipidemia Sister   . Hypertension Sister   . Esophageal cancer Sister        + smoker    Social History Social History  Substance Use Topics  . Smoking status: Never Smoker  .  Smokeless tobacco: Never Used  . Alcohol use Yes     Comment: 04/22/2015 "nothing since the 1980s; never had a problem w/it"     Allergies   Novocain [procaine]   Review of Systems Review of Systems  Constitutional: Negative for chills, diaphoresis and fever.  Respiratory: Negative for shortness of breath.   Cardiovascular: Negative for chest pain.  Skin: Negative for rash.  Neurological: Negative for numbness.     Physical Exam Updated Vital Signs BP (!) 194/90   Pulse 66   Temp 98.2 F (36.8 C) (Oral)   Resp 18   SpO2 98%   Physical Exam  Constitutional: He is oriented to person, place, and time. He appears well-developed and  well-nourished. No distress.  HENT:  Head: Atraumatic.  Eyes: Conjunctivae are normal.  Neck: Neck supple. No tracheal deviation present.  Cardiovascular: Normal rate, regular rhythm, normal heart sounds and intact distal pulses.   Pulmonary/Chest: Effort normal and breath sounds normal. No accessory muscle usage. No respiratory distress.  Abdominal: He exhibits no distension.  Musculoskeletal:  Cellulitis left lower leg. Distal pulses palp. No calf swelling, pain or tenderness. No abscess noted. Good rom at knee and ankle without pain.   Neurological: He is alert and oriented to person, place, and time.  Skin: Skin is warm and dry. No rash noted. He is not diaphoretic.  Psychiatric: He has a normal mood and affect.  Nursing note and vitals reviewed.    ED Treatments / Results  Labs (all labs ordered are listed, but only abnormal results are displayed) Labs Reviewed - No data to display  EKG  EKG Interpretation None       Radiology No results found.  Procedures Procedures (including critical care time)  Medications Ordered in ED Medications  cephALEXin (KEFLEX) capsule 1,000 mg (not administered)     Initial Impression / Assessment and Plan / ED Course  I have reviewed the triage vital signs and the nursing  notes.  Pertinent labs & imaging results that were available during my care of the patient were reviewed by me and considered in my medical decision making (see chart for details).  Exam c/w early cellulitis. Keflex po.  rx for home.  Return precautions provided.     Final Clinical Impressions(s) / ED Diagnoses   Final diagnoses:  None    New Prescriptions New Prescriptions   No medications on file     Lajean Saver, MD 11/17/16 2022

## 2016-11-17 NOTE — Discharge Instructions (Signed)
It was our pleasure to provide your ER care today - we hope that you feel better.  Elevate leg.  Take antibiotic as prescribed.   Take acetaminophen as need.  Follow up with primary care doctor Monday for recheck.  Also have blood pressure rechecked as it is high today.   Return to ER if worse, spreading redness, severe pain, high fevers, vomiting, feeling weak/faint, other concern.

## 2016-11-17 NOTE — ED Triage Notes (Signed)
Onset today noticed swelling and redness to left LE.  No fever.

## 2016-11-19 DIAGNOSIS — L03116 Cellulitis of left lower limb: Secondary | ICD-10-CM | POA: Diagnosis not present

## 2017-01-08 DIAGNOSIS — Z23 Encounter for immunization: Secondary | ICD-10-CM | POA: Diagnosis not present

## 2017-01-14 ENCOUNTER — Encounter: Payer: Self-pay | Admitting: Cardiology

## 2017-01-28 ENCOUNTER — Ambulatory Visit (INDEPENDENT_AMBULATORY_CARE_PROVIDER_SITE_OTHER): Payer: Medicare HMO | Admitting: Cardiology

## 2017-01-28 ENCOUNTER — Encounter: Payer: Self-pay | Admitting: Cardiology

## 2017-01-28 ENCOUNTER — Encounter (INDEPENDENT_AMBULATORY_CARE_PROVIDER_SITE_OTHER): Payer: Self-pay

## 2017-01-28 VITALS — BP 144/82 | HR 68 | Ht 72.0 in | Wt 209.4 lb

## 2017-01-28 DIAGNOSIS — I4819 Other persistent atrial fibrillation: Secondary | ICD-10-CM

## 2017-01-28 DIAGNOSIS — I25758 Atherosclerosis of native coronary artery of transplanted heart with other forms of angina pectoris: Secondary | ICD-10-CM

## 2017-01-28 DIAGNOSIS — E785 Hyperlipidemia, unspecified: Secondary | ICD-10-CM

## 2017-01-28 DIAGNOSIS — I481 Persistent atrial fibrillation: Secondary | ICD-10-CM

## 2017-01-28 DIAGNOSIS — I350 Nonrheumatic aortic (valve) stenosis: Secondary | ICD-10-CM

## 2017-01-28 DIAGNOSIS — I5042 Chronic combined systolic (congestive) and diastolic (congestive) heart failure: Secondary | ICD-10-CM | POA: Diagnosis not present

## 2017-01-28 DIAGNOSIS — R29898 Other symptoms and signs involving the musculoskeletal system: Secondary | ICD-10-CM | POA: Diagnosis not present

## 2017-01-28 DIAGNOSIS — I255 Ischemic cardiomyopathy: Secondary | ICD-10-CM | POA: Diagnosis not present

## 2017-01-28 DIAGNOSIS — I10 Essential (primary) hypertension: Secondary | ICD-10-CM

## 2017-01-28 HISTORY — DX: Other symptoms and signs involving the musculoskeletal system: R29.898

## 2017-01-28 LAB — BASIC METABOLIC PANEL
BUN / CREAT RATIO: 17 (ref 10–24)
BUN: 26 mg/dL (ref 8–27)
CALCIUM: 9.1 mg/dL (ref 8.6–10.2)
CHLORIDE: 100 mmol/L (ref 96–106)
CO2: 21 mmol/L (ref 20–29)
CREATININE: 1.57 mg/dL — AB (ref 0.76–1.27)
GFR calc non Af Amer: 40 mL/min/{1.73_m2} — ABNORMAL LOW (ref >59)
GFR, EST AFRICAN AMERICAN: 47 mL/min/{1.73_m2} — AB (ref >59)
GLUCOSE: 128 mg/dL — AB (ref 65–99)
Potassium: 4.6 mmol/L (ref 3.5–5.2)
Sodium: 140 mmol/L (ref 134–144)

## 2017-01-28 LAB — CBC
HEMATOCRIT: 44.9 % (ref 37.5–51.0)
HEMOGLOBIN: 15 g/dL (ref 13.0–17.7)
MCH: 30.9 pg (ref 26.6–33.0)
MCHC: 33.4 g/dL (ref 31.5–35.7)
MCV: 92 fL (ref 79–97)
Platelets: 277 10*3/uL (ref 150–379)
RBC: 4.86 x10E6/uL (ref 4.14–5.80)
RDW: 14.1 % (ref 12.3–15.4)
WBC: 9.3 10*3/uL (ref 3.4–10.8)

## 2017-01-28 LAB — HEPATIC FUNCTION PANEL
ALK PHOS: 47 IU/L (ref 39–117)
ALT: 37 IU/L (ref 0–44)
AST: 32 IU/L (ref 0–40)
Albumin: 4.5 g/dL (ref 3.5–4.7)
BILIRUBIN TOTAL: 0.8 mg/dL (ref 0.0–1.2)
BILIRUBIN, DIRECT: 0.24 mg/dL (ref 0.00–0.40)
TOTAL PROTEIN: 6.8 g/dL (ref 6.0–8.5)

## 2017-01-28 LAB — TSH: TSH: 1.63 u[IU]/mL (ref 0.450–4.500)

## 2017-01-28 LAB — CK: CK TOTAL: 99 U/L (ref 24–204)

## 2017-01-28 NOTE — Progress Notes (Signed)
Cardiology Office Note:    Date:  01/28/2017   ID:  [name], DOB 1932/05/13, MRN [id number]  PCP:  [name] Melter, MD  Cardiologist:  [name], MD   Referring MD: [name] Melter, MD   Chief Complaint  Patient presents with  . Aortic Stenosis  . Coronary Artery Disease  . Hypertension  . Hyperlipidemia  . Atrial Fibrillation    History of Present Illness:    [name] is a 83 y.o. male with a hx of HTN, AS s/p AVR, chronic combined systolic/diastolic CHF in setting of Afib with RVR, orthostatic hypotension,  DCM EF 25-30%, ASCAD s/p CABG and PAF.  He is here today for followup and is doing well.  He denies any chest pain or pressure, SOB, DOE, PND, orthopnea, LE edema, dizziness, palpitations or syncope. He has has some weakness in his arms and legs.  He is compliant with his meds and is tolerating meds with no SE.    Past Medical History:  Diagnosis Date  . AICD (automatic cardioverter/defibrillator) present   . Aortic valve disorder 2009   severe AS s/p AVR with pericardial tissue valve  . Arthritis    "minor everywhere" (04/22/2015)  . Asthma    "a touch"  . Barrett esophagus   . CAD (coronary artery disease)    s/p 2 vessel CABG with LIMA to LAD, SVG to diagonal 1, SVG to RCA 12/09  . Cardiomyopathy ([place])    EF 15-20% by echo 2017  . Chronic combined systolic and diastolic CHF (congestive heart failure) (HCC)    dry weight 197-200lbs.  . CKD (chronic kidney disease)   . Diabetes ([place])    "I'm prediabetic" (04/22/2015)  . GERD (gastroesophageal reflux disease)   . H/O: rheumatic fever   . Hepatitis 1957   "don't know what kind:  . History of hiatal hernia   . History of PFTs    a. Amiodarone started 5/16 >> PFTs w/ DLCO 6/16:  FEV1 72% predicted, FEV1/FVC 66%, DLCO 66% >> minimal reversible obstructive airways disease with mild diffusion defect (suggestive of emphysema but absence of hyperinflation inconsistent with dx)  . HTN (hypertension)   .  Hypercholesteremia   . Orthostatic hypotension   . Persistent atrial fibrillation ([place])    post op afib with no reoccurence  . Pneumonia 08/2014   "dr thought I may have had a touch"    Past Surgical History:  Procedure Laterality Date  . AORTIC VALVE REPLACEMENT  with 23-mm Magna Ease pericardial valve, model number   with 23-mm Magna Ease pericardial valve, model [id number], serial number [id number]   . APPENDECTOMY  1940s  . BI-VENTRICULAR IMPLANTABLE CARDIOVERTER DEFIBRILLATOR  (CRT-D)  04/22/2015  . CARDIAC CATHETERIZATION N/A 11/04/2014   Procedure: Left Heart Cath and Cors/Grafts Angiography;  Surgeon: Leonie Man, MD;  Location: [facility];  Service: Cardiovascular;  Laterality: N/A;  . CARDIAC VALVE REPLACEMENT    . CARDIOVERSION N/A 01/27/2013   Procedure: CARDIOVERSION;  Surgeon: Sueanne Margarita, MD;  Location: [facility];  Service: Cardiovascular;  Laterality: N/A;  . CARDIOVERSION N/A 08/25/2014   Procedure: CARDIOVERSION;  Surgeon: Sanda Klein, MD;  [facility];  Service: Cardiovascular;  Laterality: N/A;  . CATARACT EXTRACTION W/ INTRAOCULAR LENS  IMPLANT, BILATERAL Bilateral   . CORONARY ARTERY BYPASS GRAFT  03/10/2008   x 3 Dr. Roxan Hockey  . EP IMPLANTABLE DEVICE N/A 04/22/2015   Procedure: BiV ICD Insertion CRT-D;  Surgeon: [name], MD;  Location: [facility];  Service: Cardiovascular;  Laterality: N/A;  . ESOPHAGOGASTRODUODENOSCOPY (EGD) WITH ESOPHAGEAL DILATION  X1  . TONSILLECTOMY      Current Medications: Current Meds  Medication Sig  . acetaminophen (TYLENOL) 500 MG tablet Take 500 mg by mouth every 6 (six) hours as needed for moderate pain.  Marland Kitchen amiodarone (PACERONE) 200 MG tablet TAKE 1 TABLET (200 MG TOTAL) BY MOUTH DAILY.  Marland Kitchen atorvastatin (LIPITOR) 20 MG tablet TAKE 1 TABLET (20 MG TOTAL) DAILY AT 6 PM.  . carvedilol (COREG) 12.5 MG tablet Take 1 tablet (12.5 mg total) by mouth 2 (two) times daily with a meal.  .  ELIQUIS 2.5 MG TABS tablet TAKE 1 TABLET TWICE DAILY  . furosemide (LASIX) 20 MG tablet TAKE 1 TABLET (20 MG TOTAL) BY MOUTH DAILY.  . hydrALAZINE (APRESOLINE) 25 MG tablet Take 0.5 tablets (12.5 mg total) by mouth 2 (two) times daily before a meal.  . isosorbide mononitrate (IMDUR) 30 MG 24 hr tablet TAKE 1 TABLET (30 MG TOTAL) BY MOUTH DAILY.  . Magnesium 250 MG TABS Take 1 tablet by mouth every evening.   . Multiple Vitamin (MULTIVITAMIN) capsule Take 1 capsule by mouth daily.  Marland Kitchen omega-3 acid ethyl esters (LOVAZA) 1 G capsule Take 2 g by mouth 2 (two) times daily.  . ranitidine (ZANTAC) 300 MG tablet Take 300 mg by mouth at bedtime.  . traMADol (ULTRAM) 50 MG tablet Take 1 tablet by mouth as directed.     Allergies:   Novocain [procaine]   Social History   Social History  . Marital status: Married    Spouse name: N/A  . Number of children: N/A  . Years of education: N/A   Social History Main Topics  . Smoking status: Never Smoker  . Smokeless tobacco: Never Used  . Alcohol use Yes     Comment: 04/22/2015 "nothing since the 1980s; never had a problem w/it"  . Drug use: No  . Sexual activity: No   Other Topics Concern  . None   Social History Narrative   Married, lives with spouse [name]   4 children, 2 boys and 2 girls > one daughter passed   OCCUPATION: retired, Pension scheme manager for [employer]     Family History: The patient's family history includes Alzheimer's disease in his mother and unknown relative; COPD in his daughter and sister; Depression in his sister; Esophageal cancer in his sister; Heart disease in his sister; Hyperlipidemia in his sister; Hypertension in his sister.  ROS:   Please see the history of present illness.    Review of Systems  HENT: Positive for hearing loss.     All other systems reviewed and negative.   EKGs/Labs/Other Studies Reviewed:    The following studies were reviewed today: none  EKG:  EKG is not ordered today.     Recent Labs: 07/30/2016: ALT 41; BUN 27; Creatinine, Ser 1.75; Hemoglobin 14.7; Platelets 226; Potassium 4.5; Sodium 141; TSH 1.920   Recent Lipid Panel    Component Value Date/Time   CHOL 126 07/30/2016 1143   CHOL 93 (L) 06/16/2014 0914   TRIG 154 (H) 07/30/2016 1143   TRIG 124 06/16/2014 0914   HDL 45 07/30/2016 1143   HDL 34 (L) 06/16/2014 0914   CHOLHDL 2.8 07/30/2016 1143   CHOLHDL 2.7 01/19/2015 1005   VLDL 32 (H) 01/19/2015 1005   LDLCALC 50 07/30/2016 1143   LDLCALC 34 06/16/2014 0914    Physical Exam:  VS:  BP (!) 144/82   Pulse 68   Ht 6' (1.829 m)   Wt 209 lb 6.4 oz (95 kg)   SpO2 97%   BMI 28.40 kg/m     Wt Readings from Last 3 Encounters:  01/28/17 209 lb 6.4 oz (95 kg)  07/30/16 210 lb (95.3 kg)  07/30/16 210 lb (95.3 kg)     GEN:  Well nourished, well developed in no acute distress HEENT: Normal NECK: No JVD; No carotid bruits LYMPHATICS: No lymphadenopathy CARDIAC: RRR, no murmurs, rubs, gallops RESPIRATORY:  Clear to auscultation without rales, wheezing or rhonchi  ABDOMEN: Soft, non-tender, non-distended MUSCULOSKELETAL:  No edema; No deformity  SKIN: Warm and dry NEUROLOGIC:  Alert and oriented x 3 PSYCHIATRIC:  Normal affect   ASSESSMENT:    1. CAD status post CABG   2. Chronic combined systolic and diastolic heart failure (HCC)   3. Cardiomyopathy, ischemic: EF roughly 30%   4. Essential hypertension   5. Aortic stenosis, severe   6. Persistent atrial fibrillation ([place])   7. Dyslipidemia   8. Weakness of both lower extremities    PLAN:    In order of problems listed above:  1.  ASCAD s/p remote CABG - he has no angina complaints.  He will continue on BB and statin.  No ASA due to DOAC.  2.  Chronic combined systolic/diastolic CHF - he appears euvolemic on exam today.  His weight is stable.  He will continue on Lasix 71m daily, Hydralazine 12.529mBID, Imdur 3079maily and carvedilol 12.5mg97mD.  No ACE I or ARB due to  CKD.  3.  Ischemic DCM EF 30% - s/p CTR -D followed in device clinic  4.  HTN - BP well controlled on current meds.  He will continue on Carvedilol and Hydralazine.  5.  Severe AS s/p bioprosthetic AVR  6.  Persistent atrial fibrillation - he is maintaining NSR. He will continue on Eliquis 2.5mg 40m, carvedilol 12.5mg B32mand Amiodarone 200mg d72m.  I will check a BMET, CBC, TSH and LFTs. He is due for PFTs with DLCO.  7.  Hyperlipidemia with LDL goal < 70.  He will continue on atorvastatin 20mg da63m  LDL is at goal at 50 on 07/2016.  8.  Leg Weakness - I will get LE ABIs to assess for PVD   Medication Adjustments/Labs and Tests Ordered: Current medicines are reviewed at length with the patient today.  Concerns regarding medicines are outlined above.  No orders of the defined types were placed in this encounter.  No orders of the defined types were placed in this encounter.   Signed, Traci TuFransico Him/22/2018 11:14 AM    [facility]

## 2017-01-28 NOTE — Patient Instructions (Signed)
Medication Instructions:  Your physician recommends that you continue on your current medications as directed. Please refer to the Current Medication list given to you today.   Labwork: Your physician recommends that you return for lab work today (BMET, CBC, TSH, LFTs, CPK)   Testing/Procedures: Your physician has requested that you have an ankle brachial index (ABI). During this test an ultrasound and blood pressure cuff are used to evaluate the arteries that supply the arms and legs with blood. Allow thirty minutes for this exam. There are no restrictions or special instructions.  Your physician has recommended that you have a pulmonary function test. Pulmonary Function Tests are a group of tests that measure how well air moves in and out of your lungs.    Follow-Up: Your physician wants you to follow-up in: 6 months with Dr. Radford Pax. You will receive a reminder letter in the mail two months in advance. If you don't receive a letter, please call our office to schedule the follow-up appointment.   Any Other Special Instructions Will Be Listed Below (If Applicable).     If you need a refill on your cardiac medications before your next appointment, please call your pharmacy.

## 2017-01-29 ENCOUNTER — Other Ambulatory Visit: Payer: Self-pay | Admitting: Cardiology

## 2017-01-29 DIAGNOSIS — R29898 Other symptoms and signs involving the musculoskeletal system: Secondary | ICD-10-CM

## 2017-01-30 ENCOUNTER — Other Ambulatory Visit: Payer: Self-pay | Admitting: Cardiology

## 2017-01-30 DIAGNOSIS — R29898 Other symptoms and signs involving the musculoskeletal system: Secondary | ICD-10-CM

## 2017-02-01 ENCOUNTER — Ambulatory Visit (HOSPITAL_COMMUNITY)
Admission: RE | Admit: 2017-02-01 | Discharge: 2017-02-01 | Disposition: A | Discharge status: Home / Self Care | Payer: Medicare HMO | Source: Ambulatory Visit | Attending: Cardiology | Admitting: Cardiology

## 2017-02-01 DIAGNOSIS — I10 Essential (primary) hypertension: Secondary | ICD-10-CM | POA: Insufficient documentation

## 2017-02-01 DIAGNOSIS — E785 Hyperlipidemia, unspecified: Secondary | ICD-10-CM

## 2017-02-01 DIAGNOSIS — I481 Persistent atrial fibrillation: Secondary | ICD-10-CM | POA: Diagnosis not present

## 2017-02-01 DIAGNOSIS — I5042 Chronic combined systolic (congestive) and diastolic (congestive) heart failure: Secondary | ICD-10-CM | POA: Diagnosis not present

## 2017-02-01 DIAGNOSIS — I4819 Other persistent atrial fibrillation: Secondary | ICD-10-CM

## 2017-02-01 DIAGNOSIS — I25758 Atherosclerosis of native coronary artery of transplanted heart with other forms of angina pectoris: Secondary | ICD-10-CM | POA: Insufficient documentation

## 2017-02-01 DIAGNOSIS — R29898 Other symptoms and signs involving the musculoskeletal system: Secondary | ICD-10-CM | POA: Insufficient documentation

## 2017-02-01 DIAGNOSIS — I255 Ischemic cardiomyopathy: Secondary | ICD-10-CM | POA: Diagnosis not present

## 2017-02-01 DIAGNOSIS — I350 Nonrheumatic aortic (valve) stenosis: Secondary | ICD-10-CM | POA: Insufficient documentation

## 2017-02-01 LAB — PULMONARY FUNCTION TEST
DL/VA % PRED: 47 %
DL/VA: 2.24 ml/min/mmHg/L
DLCO UNC: 14.11 ml/min/mmHg
DLCO cor % pred: 39 %
DLCO cor: 13.95 ml/min/mmHg
DLCO unc % pred: 40 %
FEF 25-75 Post: 1.14 L/sec
FEF 25-75 Pre: 0.85 L/sec
FEF2575-%Change-Post: 34 %
FEF2575-%PRED-POST: 55 %
FEF2575-%Pred-Pre: 41 %
FEV1-%CHANGE-POST: 5 %
FEV1-%PRED-POST: 72 %
FEV1-%Pred-Pre: 68 %
FEV1-PRE: 2.09 L
FEV1-Post: 2.21 L
FEV1FVC-%Change-Post: 0 %
FEV1FVC-%Pred-Pre: 78 %
FEV6-%Change-Post: 8 %
FEV6-%PRED-POST: 90 %
FEV6-%Pred-Pre: 83 %
FEV6-POST: 3.63 L
FEV6-PRE: 3.36 L
FEV6FVC-%CHANGE-POST: 0 %
FEV6FVC-%PRED-PRE: 98 %
FEV6FVC-%Pred-Post: 99 %
FVC-%CHANGE-POST: 5 %
FVC-%PRED-POST: 92 %
FVC-%Pred-Pre: 87 %
FVC-Post: 3.94 L
FVC-Pre: 3.73 L
POST FEV1/FVC RATIO: 56 %
PRE FEV6/FVC RATIO: 91 %
Post FEV6/FVC ratio: 92 %
Pre FEV1/FVC ratio: 56 %
RV % PRED: 140 %
RV: 3.94 L
TLC % pred: 103 %
TLC: 7.74 L

## 2017-02-01 MED ORDER — ALBUTEROL SULFATE (2.5 MG/3ML) 0.083% IN NEBU
2.5000 mg | INHALATION_SOLUTION | Freq: Once | RESPIRATORY_TRACT | Status: AC
  Administered 2017-02-01: 2.5 mg via RESPIRATORY_TRACT

## 2017-02-06 ENCOUNTER — Ambulatory Visit (INDEPENDENT_AMBULATORY_CARE_PROVIDER_SITE_OTHER): Payer: Medicare HMO | Admitting: *Deleted

## 2017-02-06 DIAGNOSIS — I420 Dilated cardiomyopathy: Secondary | ICD-10-CM

## 2017-02-08 ENCOUNTER — Encounter: Payer: Self-pay | Admitting: Cardiology

## 2017-02-08 NOTE — Progress Notes (Signed)
Letter  

## 2017-02-08 NOTE — Progress Notes (Signed)
Remote ICD transmission.   

## 2017-02-11 LAB — CUP PACEART REMOTE DEVICE CHECK
Battery Remaining Longevity: 77 mo
Battery Voltage: 2.99 V
Brady Statistic AP VP Percent: 95.67 %
Brady Statistic AP VS Percent: 0.63 %
Brady Statistic RA Percent Paced: 95.42 %
Brady Statistic RV Percent Paced: 98.43 %
Date Time Interrogation Session: 20181031131922
HighPow Impedance: 76 Ohm
Implantable Lead Implant Date: 20170113
Implantable Lead Location: 753859
Implantable Lead Location: 753860
Implantable Lead Model: 4298
Implantable Lead Model: 5076
Lead Channel Impedance Value: 380 Ohm
Lead Channel Impedance Value: 380 Ohm
Lead Channel Impedance Value: 437 Ohm
Lead Channel Impedance Value: 456 Ohm
Lead Channel Impedance Value: 456 Ohm
Lead Channel Impedance Value: 513 Ohm
Lead Channel Impedance Value: 646 Ohm
Lead Channel Pacing Threshold Amplitude: 0.5 V
Lead Channel Pacing Threshold Pulse Width: 0.4 ms
Lead Channel Sensing Intrinsic Amplitude: 12.375 mV
Lead Channel Sensing Intrinsic Amplitude: 12.375 mV
Lead Channel Sensing Intrinsic Amplitude: 2.875 mV
Lead Channel Setting Pacing Amplitude: 1.5 V
Lead Channel Setting Pacing Pulse Width: 0.4 ms
MDC IDC LEAD IMPLANT DT: 20170113
MDC IDC LEAD IMPLANT DT: 20170113
MDC IDC LEAD LOCATION: 753858
MDC IDC MSMT LEADCHNL LV IMPEDANCE VALUE: 380 Ohm
MDC IDC MSMT LEADCHNL LV IMPEDANCE VALUE: 627 Ohm
MDC IDC MSMT LEADCHNL LV IMPEDANCE VALUE: 760 Ohm
MDC IDC MSMT LEADCHNL LV IMPEDANCE VALUE: 779 Ohm
MDC IDC MSMT LEADCHNL LV IMPEDANCE VALUE: 779 Ohm
MDC IDC MSMT LEADCHNL LV PACING THRESHOLD AMPLITUDE: 0.375 V
MDC IDC MSMT LEADCHNL LV PACING THRESHOLD PULSEWIDTH: 0.4 ms
MDC IDC MSMT LEADCHNL RA PACING THRESHOLD AMPLITUDE: 0.75 V
MDC IDC MSMT LEADCHNL RA PACING THRESHOLD PULSEWIDTH: 0.4 ms
MDC IDC MSMT LEADCHNL RA SENSING INTR AMPL: 2.875 mV
MDC IDC MSMT LEADCHNL RV IMPEDANCE VALUE: 380 Ohm
MDC IDC PG IMPLANT DT: 20170113
MDC IDC SET LEADCHNL RA PACING AMPLITUDE: 1.5 V
MDC IDC SET LEADCHNL RV PACING AMPLITUDE: 2 V
MDC IDC SET LEADCHNL RV PACING PULSEWIDTH: 0.4 ms
MDC IDC SET LEADCHNL RV SENSING SENSITIVITY: 0.3 mV
MDC IDC STAT BRADY AS VP PERCENT: 3.57 %
MDC IDC STAT BRADY AS VS PERCENT: 0.14 %

## 2017-02-12 ENCOUNTER — Other Ambulatory Visit: Payer: Self-pay

## 2017-02-12 DIAGNOSIS — R942 Abnormal results of pulmonary function studies: Secondary | ICD-10-CM

## 2017-02-14 ENCOUNTER — Encounter (HOSPITAL_COMMUNITY): Payer: Medicare HMO

## 2017-02-14 ENCOUNTER — Inpatient Hospital Stay (HOSPITAL_COMMUNITY): Admission: RE | Admit: 2017-02-14 | Payer: Medicare HMO | Source: Ambulatory Visit

## 2017-02-25 ENCOUNTER — Ambulatory Visit: Payer: Medicare HMO | Admitting: Pulmonary Disease

## 2017-02-25 ENCOUNTER — Ambulatory Visit (INDEPENDENT_AMBULATORY_CARE_PROVIDER_SITE_OTHER)
Admission: RE | Admit: 2017-02-25 | Discharge: 2017-02-25 | Disposition: A | Discharge status: Home / Self Care | Payer: Medicare HMO | Source: Ambulatory Visit | Attending: Pulmonary Disease | Admitting: Pulmonary Disease

## 2017-02-25 ENCOUNTER — Encounter (HOSPITAL_COMMUNITY): Payer: Medicare HMO

## 2017-02-25 ENCOUNTER — Encounter: Payer: Self-pay | Admitting: Pulmonary Disease

## 2017-02-25 DIAGNOSIS — R942 Abnormal results of pulmonary function studies: Secondary | ICD-10-CM | POA: Diagnosis not present

## 2017-02-25 DIAGNOSIS — J90 Pleural effusion, not elsewhere classified: Secondary | ICD-10-CM | POA: Diagnosis not present

## 2017-02-25 DIAGNOSIS — J849 Interstitial pulmonary disease, unspecified: Secondary | ICD-10-CM | POA: Diagnosis not present

## 2017-02-25 NOTE — Progress Notes (Signed)
[name]    [id number]    [birth year]  Primary Care Physician:Meyers, Annie Main, MD  Referring Physician: [name], [address] [place], [place] [address]  Chief complaint: Follow up for abnormal pulmonary function test. ? Amiodarone toxicity  HPI: [name] is an 83 year old with past medical history history of atrial fibrillation on amiodarone and Eliquis anticoagulation, severe AS with aortic wall replacement , coronary artery disease cardiac myopathy [EF 50-20 percent]. He has been maintained on amiodarone since 2016 with regular PFTs. 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  He has some mild dyspnea on exertion but denies any other respiratory complaint. He does not any cough, sputum production, wheezing, chest pain, palpitations. He is a lifelong nonsmoker. He used to work as a Dealer, Pension scheme manager, Lobbyist. He does not report any exposure to asbestos.  Interim History: [name] reports worsening dyspnea on exertion.  Denies any cough, sputum production, wheezing.  He has had recent PFTs which again showed obstruction with diffusion impairment and is here for reevaluation of amiodarone pulmonary toxicity.  Outpatient Encounter Medications as of 02/25/2017  Medication Sig  . acetaminophen (TYLENOL) 500 MG tablet Take 500 mg by mouth every 6 (six) hours as needed for moderate pain.  Marland Kitchen amiodarone (PACERONE) 200 MG tablet TAKE 1 TABLET (200 MG TOTAL) BY MOUTH DAILY.  Marland Kitchen atorvastatin (LIPITOR) 20 MG tablet TAKE 1 TABLET (20 MG TOTAL) DAILY AT 6 PM.  . carvedilol (COREG) 12.5 MG tablet Take 1 tablet (12.5 mg total) by mouth 2 (two) times daily with a meal.  . ELIQUIS 2.5 MG TABS tablet TAKE 1 TABLET TWICE DAILY  . furosemide (LASIX) 20 MG tablet TAKE 1 TABLET (20 MG TOTAL) BY MOUTH DAILY.  . hydrALAZINE (APRESOLINE) 25 MG tablet Take 0.5 tablets (12.5 mg total) by  mouth 2 (two) times daily before a meal.  . isosorbide mononitrate (IMDUR) 30 MG 24 hr tablet TAKE 1 TABLET (30 MG TOTAL) BY MOUTH DAILY.  . Magnesium 250 MG TABS Take 1 tablet by mouth every evening.   . Multiple Vitamin (MULTIVITAMIN) capsule Take 1 capsule by mouth daily.  Marland Kitchen omega-3 acid ethyl esters (LOVAZA) 1 G capsule Take 2 g by mouth 2 (two) times daily.  . ranitidine (ZANTAC) 300 MG tablet Take 300 mg by mouth at bedtime.  . traMADol (ULTRAM) 50 MG tablet Take 1 tablet by mouth as directed.   No facility-administered encounter medications on file as of 02/25/2017.     Allergies as of 02/25/2017 - Review Complete 02/25/2017  Allergen Reaction Noted  . Novocain [procaine] Swelling 08/22/2014    Past Medical History:  Diagnosis Date  . AICD (automatic cardioverter/defibrillator) present   . Aortic valve disorder 2009   severe AS s/p AVR with pericardial tissue valve  . Arthritis    "minor everywhere" (04/22/2015)  . Asthma    "a touch"  . Barrett esophagus   . CAD (coronary artery disease)    s/p 2 vessel CABG with LIMA to LAD, SVG to diagonal 1, SVG to RCA 12/09  . Cardiomyopathy ([facility])    EF 15-20% by echo 2017  . Chronic combined systolic and diastolic CHF (congestive heart failure) (HCC)    dry weight 197-200lbs.  . CKD (chronic kidney disease)   . Diabetes ([facility])    "I'm prediabetic" (04/22/2015)  . GERD (gastroesophageal reflux disease)   .  H/O: rheumatic fever   . Hepatitis 1957   "don't know what kind:  . History of hiatal hernia   . History of PFTs    a. Amiodarone started 5/16 >> PFTs w/ DLCO 6/16:  FEV1 72% predicted, FEV1/FVC 66%, DLCO 66% >> minimal reversible obstructive airways disease with mild diffusion defect (suggestive of emphysema but absence of hyperinflation inconsistent with dx)  . HTN (hypertension)   . Hypercholesteremia   . Orthostatic hypotension   . Persistent atrial fibrillation ([facility])    post op afib with no reoccurence  . Pneumonia  08/2014   "dr thought I may have had a touch"    Past Surgical History:  Procedure Laterality Date  . AORTIC VALVE REPLACEMENT  with 23-mm Magna Ease pericardial valve, model number   with 23-mm Magna Ease pericardial valve, model number3300TFX, serial number [id number]   . APPENDECTOMY  1940s  . BI-VENTRICULAR IMPLANTABLE CARDIOVERTER DEFIBRILLATOR  (CRT-D)  04/22/2015  . BiV ICD Insertion CRT-D N/A 04/22/2015   Performed by Constance Haw, MD at [facility]  . CARDIAC VALVE REPLACEMENT    . CARDIOVERSION N/A 08/25/2014   Performed by Sanda Klein, MD at [facility]  . CARDIOVERSION N/A 01/27/2013   Performed by Sueanne Margarita, MD at [facility] ENDOSCOPY  . CATARACT EXTRACTION W/ INTRAOCULAR LENS  IMPLANT, BILATERAL Bilateral   . CORONARY ARTERY BYPASS GRAFT  03/10/2008   x 3 Dr. Roxan Hockey  . ESOPHAGOGASTRODUODENOSCOPY (EGD) WITH ESOPHAGEAL DILATION  X1  . Left Heart Cath and Cors/Grafts Angiography N/A 11/04/2014   Performed by Leonie Man, MD at [facility]  . TONSILLECTOMY      Family History  Problem Relation Age of Onset  . Alzheimer's disease Mother   . COPD Daughter   . Alzheimer's disease Unknown   . COPD Sister   . Depression Sister   . Heart disease Sister   . Hyperlipidemia Sister   . Hypertension Sister   . Esophageal cancer Sister        + smoker    Social History   Socioeconomic History  . Marital status: Married    Spouse name: Not on file  . Number of children: Not on file  . Years of education: Not on file  . Highest education level: Not on file  Social Needs  . Financial resource strain: Not on file  . Food insecurity - worry: Not on file  . Food insecurity - inability: Not on file  . Transportation needs - medical: Not on file  . Transportation needs - non-medical: Not on file  Occupational History  . Not on file  Tobacco Use  . Smoking status: Never Smoker  . Smokeless tobacco: Never Used  Substance and Sexual Activity  .  Alcohol use: Yes    Comment: 04/22/2015 "nothing since the 1980s; never had a problem w/it"  . Drug use: No  . Sexual activity: No  Other Topics Concern  . 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  HENT: Negative.   Eyes: Negative for blurred vision.  Respiratory: as per HPI  Cardiovascular: Negative for chest pain and palpitations.  Gastrointestinal: Negative for vomiting, diarrhea, blood per rectum. Genitourinary: Negative for dysuria, urgency, frequency and  hematuria.  Musculoskeletal: Negative for myalgias, back pain and joint pain.  Skin: Negative for itching and rash.  Neurological: Negative for dizziness, tremors, focal weakness, seizures and loss of consciousness.  Endo/Heme/Allergies: Negative for environmental allergies.  Psychiatric/Behavioral: Negative for depression, suicidal ideas and hallucinations.  All other systems reviewed and are negative.  Physical Exam: Blood pressure 134/76, pulse 64, height 6' (1.829 m), weight 213 lb (96.6 kg), SpO2 97 %. Gen:      No acute distress HEENT:  EOMI, sclera anicteric Neck:     No masses; no thyromegaly Lungs:    Clear to auscultation bilaterally; normal respiratory effort CV:         Regular rate and rhythm; no murmurs Abd:      + bowel sounds; soft, non-tender; no palpable masses, no distension Ext:    No edema; adequate peripheral perfusion Skin:      Warm and dry; no rash Neuro: alert and oriented x 3 Psych: normal mood and affect  Data Reviewed: PFTs 02/01/17 FVC 3.94 [92%], FEV1 2.21 [72%], F/F 56, TLC 103%, DLCO 40% Mild obstructive airway disease with diffusion defect.  Stable compared to 2017.  PFTs 02/03/16 FVC 4.32 [100%), FEV1 2.41 [78%], F/F 56, TLC 105%, DLCO 42% Mild  obstructive disease with severe diffusion defect. Compared to 2016 has been a decline in the diffusion capacity.  PFTs 09/13/14 FVC 4.22 [96%), FEV1 2.49 (79%), F/F 59, TLC 102%., DLCO 66% Mild obstructive airway disease, mild diffusion defect  High-resolution CT chest 05/08/16-no interstitial lung disease.  Enlarged pulmonary artery Chest x-ray 02/25/17-no acute lung abnormality. Reviewed all images personally.  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 Reviewed his PFTs.  The most recent PFT from Oct 2018 shows stability in diffusion capacity and lung volumes compared to 2017 however he is more symptomatic with increased dyspnea.  Chest x-ray does not show any interstitial process but given his symptoms we will reevaluate with a CT of the chest to assess amiodarone pulmonary toxicity.  Plan/Recommendations: - High res CT of chest.  Marshell Garfinkel MD [facility] Pager [phone] 02/25/2017, 4:28 PM  CC: [name], MD

## 2017-02-25 NOTE — Patient Instructions (Signed)
Your chest x-ray does not show any acute change compared to your prior imaging We will need a CT scan of the chest to get a better look at the lungs We will order a high-resolution CT of the chest. Follow-up in 1 month.

## 2017-03-05 ENCOUNTER — Ambulatory Visit (INDEPENDENT_AMBULATORY_CARE_PROVIDER_SITE_OTHER)
Admission: RE | Admit: 2017-03-05 | Discharge: 2017-03-05 | Disposition: A | Discharge status: Home / Self Care | Payer: Medicare HMO | Source: Ambulatory Visit | Attending: Pulmonary Disease | Admitting: Pulmonary Disease

## 2017-03-05 DIAGNOSIS — R918 Other nonspecific abnormal finding of lung field: Secondary | ICD-10-CM | POA: Diagnosis not present

## 2017-03-05 DIAGNOSIS — J849 Interstitial pulmonary disease, unspecified: Secondary | ICD-10-CM | POA: Diagnosis not present

## 2017-03-05 DIAGNOSIS — J90 Pleural effusion, not elsewhere classified: Secondary | ICD-10-CM | POA: Diagnosis not present

## 2017-03-13 ENCOUNTER — Other Ambulatory Visit: Payer: Self-pay

## 2017-03-13 DIAGNOSIS — J849 Interstitial pulmonary disease, unspecified: Secondary | ICD-10-CM

## 2017-03-13 MED ORDER — PREDNISONE 10 MG PO TABS: ORAL_TABLET | ORAL | 0 refills | Status: DC

## 2017-03-14 ENCOUNTER — Other Ambulatory Visit: Payer: Self-pay | Admitting: Cardiology

## 2017-03-14 MED ORDER — HYDRALAZINE HCL 25 MG PO TABS: 12.5000 mg | ORAL_TABLET | Freq: Two times a day (BID) | ORAL | 3 refills | Status: DC

## 2017-03-20 ENCOUNTER — Telehealth: Payer: Self-pay

## 2017-03-20 DIAGNOSIS — I4891 Unspecified atrial fibrillation: Secondary | ICD-10-CM

## 2017-03-20 NOTE — Telephone Encounter (Signed)
Notes recorded by Teressa Senter, RN on 03/20/2017 at 1:55 PM EST Patient made aware of results. Patient instructed to stop Amio and will receive a call to schedule 30 day event monitor in 3 months. Patient in agreement with plan, verbalizes understanding and thanked me for the call.    Notes recorded by Sueanne Margarita, MD on 03/18/2017 at 8:27 PM EST OK to stop Amio and we will follow for recurrent afib. Please get a 30 day heart monitor in 3 months once Amio is washed out of system

## 2017-04-04 ENCOUNTER — Telehealth: Payer: Self-pay | Admitting: *Deleted

## 2017-04-04 ENCOUNTER — Ambulatory Visit (INDEPENDENT_AMBULATORY_CARE_PROVIDER_SITE_OTHER): Payer: Medicare HMO

## 2017-04-04 DIAGNOSIS — I4891 Unspecified atrial fibrillation: Secondary | ICD-10-CM

## 2017-04-04 NOTE — Telephone Encounter (Signed)
Please get him in to see PA

## 2017-04-04 NOTE — Telephone Encounter (Signed)
Patient came in today to have a Cardiac Event Monitor applied.  Patient complained of continued shortness of breath and wanted to bring to Dr. Landis Gandy attention.  He stated it has improved slightly since stopping Amiodarone, but, not much.  He has been checking his SpO2 at home, which, he states runs approximately 94.

## 2017-04-05 NOTE — Telephone Encounter (Signed)
Spoke with patient. He c/o sob with activity that has improved since he has stopped amiodarone. Per Dr. Radford Pax scheduled patient with PA. Scheduled patient for first available slot on 04/23/17 with Ermalinda Barrios, PA. Patient in agreement with plan and thanked me for the call.

## 2017-04-08 DIAGNOSIS — E1121 Type 2 diabetes mellitus with diabetic nephropathy: Secondary | ICD-10-CM | POA: Diagnosis not present

## 2017-04-08 DIAGNOSIS — R0600 Dyspnea, unspecified: Secondary | ICD-10-CM | POA: Diagnosis not present

## 2017-04-17 ENCOUNTER — Telehealth: Payer: Self-pay | Admitting: Cardiology

## 2017-04-17 NOTE — Telephone Encounter (Signed)
Showed Dr Radford Pax the pts event monitor reading, and she ordered for the pt to to have his pacemaker interrogated today, to rule out afib aberation.  Scheduler in EP consulted with our device clinic, who will have the pt send in a remote transmission. Device to give this to Dr Radford Pax for further review and recommendation.  Per Melissa scheduler in EP, pt is aware to send in transmission now.  Will forward this message to Dr Theodosia Blender RN for further follow-up, as needed.

## 2017-04-17 NOTE — Telephone Encounter (Signed)
Angie with [employer] is calling to inform Dr Radford Pax that this pt had an abnormal EKG reading today.  Per Angie, the pt had a 13 sec run of wide complex tach, with intrinsic beats at 0925 EST.  This was auto-triggered.  Pt reported no symptoms, just mild weakness.  Pt had no syncopal events.  Will show abnormal monitor to Dr Radford Pax for further review and recommendation.

## 2017-04-17 NOTE — Telephone Encounter (Signed)
New message  [name] is calling from [facility] she verbalized that she is calling for RN  to give a abnormal EKG for pt  Transferred to triage

## 2017-04-18 ENCOUNTER — Encounter: Payer: Self-pay | Admitting: Cardiology

## 2017-04-18 ENCOUNTER — Ambulatory Visit (INDEPENDENT_AMBULATORY_CARE_PROVIDER_SITE_OTHER): Payer: Medicare HMO | Admitting: Cardiology

## 2017-04-18 VITALS — BP 136/74 | HR 64 | Ht 72.0 in | Wt 199.2 lb

## 2017-04-18 DIAGNOSIS — I428 Other cardiomyopathies: Secondary | ICD-10-CM | POA: Diagnosis not present

## 2017-04-18 DIAGNOSIS — I480 Paroxysmal atrial fibrillation: Secondary | ICD-10-CM

## 2017-04-18 DIAGNOSIS — I472 Ventricular tachycardia, unspecified: Secondary | ICD-10-CM

## 2017-04-18 DIAGNOSIS — Z79899 Other long term (current) drug therapy: Secondary | ICD-10-CM

## 2017-04-18 DIAGNOSIS — I5042 Chronic combined systolic (congestive) and diastolic (congestive) heart failure: Secondary | ICD-10-CM | POA: Diagnosis not present

## 2017-04-18 LAB — CUP PACEART INCLINIC DEVICE CHECK
Battery Remaining Longevity: 74 mo
Battery Voltage: 2.98 V
Brady Statistic AS VS Percent: 0.77 %
Brady Statistic RA Percent Paced: 88.8 %
Date Time Interrogation Session: 20190110110132
HIGH POWER IMPEDANCE MEASURED VALUE: 67 Ohm
Implantable Lead Implant Date: 20170113
Implantable Lead Implant Date: 20170113
Implantable Lead Location: 753858
Implantable Lead Location: 753860
Implantable Lead Model: 4298
Implantable Pulse Generator Implant Date: 20170113
Lead Channel Impedance Value: 380 Ohm
Lead Channel Impedance Value: 437 Ohm
Lead Channel Impedance Value: 456 Ohm
Lead Channel Impedance Value: 646 Ohm
Lead Channel Impedance Value: 779 Ohm
Lead Channel Impedance Value: 779 Ohm
Lead Channel Pacing Threshold Amplitude: 0.5 V
Lead Channel Pacing Threshold Pulse Width: 0.4 ms
Lead Channel Pacing Threshold Pulse Width: 0.4 ms
Lead Channel Sensing Intrinsic Amplitude: 10.625 mV
Lead Channel Sensing Intrinsic Amplitude: 3 mV
Lead Channel Setting Pacing Amplitude: 1.5 V
Lead Channel Setting Pacing Pulse Width: 0.4 ms
MDC IDC LEAD IMPLANT DT: 20170113
MDC IDC LEAD LOCATION: 753859
MDC IDC MSMT LEADCHNL LV IMPEDANCE VALUE: 380 Ohm
MDC IDC MSMT LEADCHNL LV IMPEDANCE VALUE: 399 Ohm
MDC IDC MSMT LEADCHNL LV IMPEDANCE VALUE: 494 Ohm
MDC IDC MSMT LEADCHNL LV IMPEDANCE VALUE: 665 Ohm
MDC IDC MSMT LEADCHNL LV IMPEDANCE VALUE: 779 Ohm
MDC IDC MSMT LEADCHNL LV PACING THRESHOLD PULSEWIDTH: 0.4 ms
MDC IDC MSMT LEADCHNL RA PACING THRESHOLD AMPLITUDE: 0.625 V
MDC IDC MSMT LEADCHNL RA SENSING INTR AMPL: 5.125 mV
MDC IDC MSMT LEADCHNL RV IMPEDANCE VALUE: 399 Ohm
MDC IDC MSMT LEADCHNL RV IMPEDANCE VALUE: 456 Ohm
MDC IDC MSMT LEADCHNL RV PACING THRESHOLD AMPLITUDE: 0.625 V
MDC IDC MSMT LEADCHNL RV SENSING INTR AMPL: 12.125 mV
MDC IDC SET LEADCHNL LV PACING AMPLITUDE: 1.5 V
MDC IDC SET LEADCHNL RV PACING AMPLITUDE: 2 V
MDC IDC SET LEADCHNL RV PACING PULSEWIDTH: 0.4 ms
MDC IDC SET LEADCHNL RV SENSING SENSITIVITY: 0.3 mV
MDC IDC STAT BRADY AP VP PERCENT: 89.7 %
MDC IDC STAT BRADY AP VS PERCENT: 0.79 %
MDC IDC STAT BRADY AS VP PERCENT: 8.74 %
MDC IDC STAT BRADY RV PERCENT PACED: 96.28 %

## 2017-04-18 LAB — BASIC METABOLIC PANEL
BUN / CREAT RATIO: 18 (ref 10–24)
BUN: 24 mg/dL (ref 8–27)
CALCIUM: 9.4 mg/dL (ref 8.6–10.2)
CHLORIDE: 102 mmol/L (ref 96–106)
CO2: 22 mmol/L (ref 20–29)
CREATININE: 1.37 mg/dL — AB (ref 0.76–1.27)
GFR calc Af Amer: 55 mL/min/{1.73_m2} — ABNORMAL LOW (ref >59)
GFR, EST NON AFRICAN AMERICAN: 48 mL/min/{1.73_m2} — AB (ref >59)
Glucose: 102 mg/dL — ABNORMAL HIGH (ref 65–99)
Potassium: 5 mmol/L (ref 3.5–5.2)
Sodium: 139 mmol/L (ref 134–144)

## 2017-04-18 LAB — PRO B NATRIURETIC PEPTIDE: NT-Pro BNP: 10928 pg/mL — ABNORMAL HIGH (ref 0–486)

## 2017-04-18 MED ORDER — MEXILETINE HCL 250 MG PO CAPS: 250.0000 mg | ORAL_CAPSULE | Freq: Two times a day (BID) | ORAL | 3 refills | Status: DC

## 2017-04-18 MED ORDER — MEXILETINE HCL 250 MG PO CAPS: 250.0000 mg | ORAL_CAPSULE | Freq: Three times a day (TID) | ORAL | 3 refills | Status: DC

## 2017-04-18 NOTE — Patient Instructions (Addendum)
Medication Instructions: Start Mexiletine 250 mg twice daily  Labwork: Your physician recommends that you have lab work today: BMET/BNP   Procedures/Testing: None ordered  Follow-Up: Your physician recommends that you schedule a follow-up appointment in: 3 months with Dr. Curt Bears.  Any Additional Special Instructions Will Be Listed Below (If Applicable).   Mexiletine capsules What is this medicine? MEXILETINE (mex [place] e teen) is an antiarrhythmic agent. This medicine is used to treat irregular heart rhythm and can slow rapid heartbeats. It can help your heart to return to and maintain a normal rhythm. Because of the side effects caused by this medicine, it is usually used for heartbeat problems that may be life-threatening. This medicine may be used for other purposes; ask your health care provider or pharmacist if you have questions. COMMON BRAND NAME(S): Mexitil What should I tell my health care provider before I take this medicine? They need to know if you have any of these conditions: -liver disease -other heart problems -previous heart attack -an unusual or allergic reaction to mexiletine, other medicines, foods, dyes, or preservatives -pregnant or trying to get pregnant -breast-feeding How should I use this medicine? Take this medicine by mouth with a glass of water. Follow the directions on the prescription label. It is recommended that you take this medicine with food or an antacid. Take your doses at regular intervals. Do not take your medicine more often than directed. Do not stop taking except on the advice of your doctor or health care professional. Talk to your pediatrician regarding the use of this medicine in children. Special care may be needed. Overdosage: If you think you have taken too much of this medicine contact a poison control center or emergency room at once. NOTE: This medicine is only for you. Do not share this medicine with others. What if I miss a  dose? If you miss a dose, take it as soon as you can. If it is almost time for your next dose, take only that dose. Do not take double or extra doses. What may interact with this medicine? Do not take this medicine with any of the following medications: -dofetilide This medicine may also interact with the following medications: -caffeine -cimetidine -medicines for depression, anxiety, or psychotic disturbances -medicines to control heart rhythm -phenobarbital -phenytoin -rifampin -theophylline This list may not describe all possible interactions. Give your health care provider a list of all the medicines, herbs, non-prescription drugs, or dietary supplements you use. Also tell them if you smoke, drink alcohol, or use illegal drugs. Some items may interact with your medicine. What should I watch for while using this medicine? Your condition will be monitored closely when you first begin therapy. Often, this drug is first started in a hospital or other monitored health care setting. Once you are on maintenance therapy, visit your doctor or health care professional for regular checks on your progress. Because your condition and use of this medicine carry some risk, it is a good idea to carry an identification card, necklace or bracelet with details of your condition, medications, and doctor or health care professional. Dennis Bast may get drowsy or dizzy. Do not drive, use machinery, or do anything that needs mental alertness until you know how this medicine affects you. Do not stand or sit up quickly, especially if you are an older patient. This reduces the risk of dizzy or fainting spells. Alcohol can make you more dizzy, increase flushing and rapid heartbeats. Avoid alcoholic drinks. What side effects may I notice  from receiving this medicine? Side effects that you should report to your doctor or health care professional as soon as possible: -allergic reactions like skin rash, itching or hives, swelling  of the face, lips, or tongue -breathing problems -chest pain, continued irregular heartbeats -redness, blistering, peeling or loosening of the skin, including inside the mouth -seizures -skin rash -trembling, shaking -unusual bleeding or bruising -unusually weak or tired Side effects that usually do not require medical attention (report to your doctor or health care professional if they continue or are bothersome): -blurred vision -difficulty walking -heartburn -nausea, vomiting -nervousness -numbness, or tingling in the fingers or toes This list may not describe all possible side effects. Call your doctor for medical advice about side effects. You may report side effects to FDA at 1-800-FDA-1088. Where should I keep my medicine? Keep out of reach of children. Store at room temperature between 15 and 30 degrees C (59 and 86 degrees F). Throw away any unused medicine after the expiration date. NOTE: This sheet is a summary. It may not cover all possible information. If you have questions about this medicine, talk to your doctor, pharmacist, or health care provider.  2018 Elsevier/Gold Standard (2007-10-13 13:59:49)     If you need a refill on your cardiac medications before your next appointment, please call your pharmacy.

## 2017-04-18 NOTE — Addendum Note (Signed)
Addended by: Stanton Kidney on: 04/18/2017 03:25 PM   Modules accepted: Orders

## 2017-04-18 NOTE — Progress Notes (Addendum)
Electrophysiology Office Note   Date:  04/18/2017   ID:  [name], DOB 1932-10-14, MRN [id number]  PCP:  [name] Melter, MD  Cardiologist:  [name] Primary Electrophysiologist: [name] Ellerman Meredith Leeds, MD    Chief Complaint  Patient presents with  . Defib Check    VT/Chronic combined systolic and diastolic HF/PAF     History of Present Illness: DAVI [name] is a 84 y.o. male who presents today for electrophysiology evaluation.   He has a history of hypertension, aortic stenosis status post AVR, chronic combined systolic and diastolic CHF in the setting of A. fib with RVR, orthostatic hypotension, dilated cardiomyopathy EF 25-30%, status post CABG, and paroxysmal atrial fibrillation.  He does have paroxysmal atrial fibrillation has been cardioverted in the past and is now on a fixed within with rhythm control via amiodarone. He had CRT-D upgrade on 04/22/15.   Today, denies symptoms of palpitations, chest pain, shortness of breath, orthopnea, PND, lower extremity edema, claudication, dizziness, presyncope, syncope, bleeding, or neurologic sequela. The patient is tolerating medications without difficulties.  His main symptoms has been fatigue and weakness.  Device interrogation shows multiple episodes of ventricular tachycardia below his detection limit.  He has had symptoms of fatigue and weakness that could have been due to ventricular tachycardia.  Past Medical History:  Diagnosis Date  . AICD (automatic cardioverter/defibrillator) present   . Aortic valve disorder 2009   severe AS s/p AVR with pericardial tissue valve  . Arthritis    "minor everywhere" (04/22/2015)  . Asthma    "a touch"  . Barrett esophagus   . CAD (coronary artery disease)    s/p 2 vessel CABG with LIMA to LAD, SVG to diagonal 1, SVG to RCA 12/09  . Cardiomyopathy ([place])    EF 15-20% by echo 2017  . Chronic combined systolic and diastolic CHF (congestive heart failure) (HCC)    dry weight  197-200lbs.  . CKD (chronic kidney disease)   . Diabetes ([place])    "I'm prediabetic" (04/22/2015)  . GERD (gastroesophageal reflux disease)   . H/O: rheumatic fever   . Hepatitis 1957   "don't know what kind:  . History of hiatal hernia   . History of PFTs    a. Amiodarone started 5/16 >> PFTs w/ DLCO 6/16:  FEV1 72% predicted, FEV1/FVC 66%, DLCO 66% >> minimal reversible obstructive airways disease with mild diffusion defect (suggestive of emphysema but absence of hyperinflation inconsistent with dx)  . HTN (hypertension)   . Hypercholesteremia   . Orthostatic hypotension   . Persistent atrial fibrillation ([place])    post op afib with no reoccurence  . Pneumonia 08/2014   "dr thought I may have had a touch"   Past Surgical History:  Procedure Laterality Date  . AORTIC VALVE REPLACEMENT  with 23-mm Magna Ease pericardial valve, model number   with 23-mm Magna Ease pericardial valve, model [id number], serial number [id number]   . APPENDECTOMY  1940s  . BI-VENTRICULAR IMPLANTABLE CARDIOVERTER DEFIBRILLATOR  (CRT-D)  04/22/2015  . CARDIAC CATHETERIZATION N/A 11/04/2014   Procedure: Left Heart Cath and Cors/Grafts Angiography;  Surgeon: Leonie Man, MD;  Location: [facility];  Service: Cardiovascular;  Laterality: N/A;  . CARDIAC VALVE REPLACEMENT    . CARDIOVERSION N/A 01/27/2013   Procedure: CARDIOVERSION;  Surgeon: Sueanne Margarita, MD;  Location: [facility] ENDOSCOPY;  Service: Cardiovascular;  Laterality: N/A;  . CARDIOVERSION N/A 08/25/2014   Procedure: CARDIOVERSION;  Surgeon: Sanda Klein, MD;  Location: [facility]  ENDOSCOPY;  Service: Cardiovascular;  Laterality: N/A;  . CATARACT EXTRACTION W/ INTRAOCULAR LENS  IMPLANT, BILATERAL Bilateral   . CORONARY ARTERY BYPASS GRAFT  03/10/2008   x 3 Dr. Roxan Hockey  . EP IMPLANTABLE DEVICE N/A 04/22/2015   Procedure: BiV ICD Insertion CRT-D;  Surgeon: Thos Matsumoto Meredith Leeds, MD;  Location: [place] [facility];  Service: Cardiovascular;  Laterality: N/A;   . ESOPHAGOGASTRODUODENOSCOPY (EGD) WITH ESOPHAGEAL DILATION  X1  . TONSILLECTOMY       Current Outpatient Medications  Medication Sig Dispense Refill  . acetaminophen (TYLENOL) 500 MG tablet Take 500 mg by mouth every 6 (six) hours as needed for moderate pain.    Marland Kitchen atorvastatin (LIPITOR) 20 MG tablet TAKE 1 TABLET (20 MG TOTAL) DAILY AT 6 PM. 90 tablet 2  . carvedilol (COREG) 12.5 MG tablet Take 1 tablet (12.5 mg total) by mouth 2 (two) times daily with a meal. 180 tablet 3  . ELIQUIS 2.5 MG TABS tablet TAKE 1 TABLET TWICE DAILY 180 tablet 2  . furosemide (LASIX) 20 MG tablet TAKE 1 TABLET (20 MG TOTAL) BY MOUTH DAILY. 90 tablet 2  . hydrALAZINE (APRESOLINE) 25 MG tablet Take 0.5 tablets (12.5 mg total) by mouth 2 (two) times daily before a meal. 90 tablet 3  . isosorbide mononitrate (IMDUR) 30 MG 24 hr tablet TAKE 1 TABLET (30 MG TOTAL) BY MOUTH DAILY. 90 tablet 2  . Multiple Vitamin (MULTIVITAMIN) capsule Take 1 capsule by mouth daily.    Marland Kitchen omega-3 acid ethyl esters (LOVAZA) 1 G capsule Take 2 g by mouth 2 (two) times daily.    . ranitidine (ZANTAC) 300 MG tablet Take 300 mg by mouth at bedtime.    . traMADol (ULTRAM) 50 MG tablet Take 1 tablet by mouth as directed.    . mexiletine (MEXITIL) 250 MG capsule Take 1 capsule (250 mg total) by mouth 3 (three) times daily. 60 capsule 3   No current facility-administered medications for this visit.     Allergies:   Novocain [procaine]   Social History:  The patient  reports that  has never smoked. he has never used smokeless tobacco. He reports that he drinks alcohol. He reports that he does not use drugs.   Family History:  The patient's family history includes Alzheimer's disease in his mother and unknown relative; COPD in his daughter and sister; Depression in his sister; Esophageal cancer in his sister; Heart disease in his sister; Hyperlipidemia in his sister; Hypertension in his sister.   ROS:  Please see the history of present  illness.   Otherwise, review of systems is positive for fatigue, weakness, shortness of breath.   All other systems are reviewed and negative.   PHYSICAL EXAM: VS:  BP 136/74   Pulse 64   Ht 6' (1.829 m)   Wt 199 lb 3.2 oz (90.4 kg)   SpO2 98%   BMI 27.02 kg/m  , BMI Body mass index is 27.02 kg/m. GEN: Well nourished, well developed, in no acute distress  HEENT: normal  Neck: no JVD, carotid bruits, or masses Cardiac: RRR; no murmurs, rubs, or gallops,no edema  Respiratory:  clear to auscultation bilaterally, normal work of breathing GI: soft, nontender, nondistended, + BS MS: no deformity or atrophy  Skin: warm and dry, device site well healed Neuro:  Strength and sensation are intact Psych: euthymic mood, full affect  EKG:  EKG is ordered today. Personal review of the ekg ordered shows AV paced  Personal review of the  device interrogation today. Results in [place]: 01/28/2017: ALT 37; BUN 26; Creatinine, Ser 1.57; Hemoglobin 15.0; Platelets 277; Potassium 4.6; Sodium 140; TSH 1.630    Lipid Panel     Component Value Date/Time   CHOL 126 07/30/2016 1143   CHOL 93 (L) 06/16/2014 0914   TRIG 154 (H) 07/30/2016 1143   TRIG 124 06/16/2014 0914   HDL 45 07/30/2016 1143   HDL 34 (L) 06/16/2014 0914   CHOLHDL 2.8 07/30/2016 1143   CHOLHDL 2.7 01/19/2015 1005   VLDL 32 (H) 01/19/2015 1005   LDLCALC 50 07/30/2016 1143   LDLCALC 34 06/16/2014 0914     Wt Readings from Last 3 Encounters:  04/18/17 199 lb 3.2 oz (90.4 kg)  02/25/17 209 lb 6.4 oz (95 kg)  01/28/17 209 lb 6.4 oz (95 kg)      Other studies Reviewed: Additional studies/ records that were reviewed today include: TTE 02/02/15  Review of the above records today demonstrates:  - Left ventricle: The cavity size was mildly dilated. Systolic function was severely reduced. The estimated ejection fraction was in the range of 25% to 30%. There is akinesis of the basal-midinferoseptal  myocardium. There is akinesis of the entireapical myocardium. There is akinesis of the mid-apicallateral myocardium. There is akinesis of the mid-apicalinferior myocardium. There is severe hypokinesis of the entireinferolateral myocardium. There is akinesis of the apicalanterior myocardium. There is akinesis of the midanteroseptal myocardium. Features are consistent with a pseudonormal left ventricular filling pattern, with concomitant abnormal relaxation and increased filling pressure (grade 2 diastolic dysfunction). Doppler parameters are consistent with high ventricular filling pressure. - Aortic valve: Poorly visualized. A bioprosthesis was present and functioning normally. - Mitral valve: Calcified annulus. Mild diffuse calcification of the anterior leaflet. There was mild regurgitation. - Left atrium: The atrium was severely dilated. - Tricuspid valve: There was trivial regurgitation.   ASSESSMENT AND PLAN:  1.  Chronic combined systolic and diastolic congestive heart failure: Tronic CRT-D implanted 04/22/15.  Was taken off of amiodarone and has since had more ventricular tachycardia.  It appears that he was taken off of amiodarone due to shortness of breath.  He has had VT just below his detection zone and thus has not been treated for it.  Today we lowered both his monitor zone and his detection zone so that we Kimberely Mccannon see any rhythm abnormalities that he should have.  As he has been symptomatic on amiodarone, we Dearl Rudden plan to start [name] on mexiletine today.  If this does not help out with his rhythm abnormalities, we may need to restart [name] on amiodarone in the future.  We Valerio Pinard also recheck a basic metabolic and BNP to further determine his volume status and his kidney function.    This visit required complex medical decision making with ICD reprogramming.  2. Paroxysmal atrial fibrillation: Early on Eliquis.  Not on antiarrhythmics as he has been taken off of his  amiodarone.  In sinus rhythm today.  This patients CHA2DS2-VASc Score and unadjusted Ischemic Stroke Rate (% per year) is equal to 7.2 % stroke rate/year from a score of 5  Above score calculated as 1 point each if present [CHF, HTN, DM, Vascular=MI/PAD/Aortic Plaque, Age if 65-74, or Male] Above score calculated as 2 points each if present [Age > 75, or Stroke/TIA/TE]  3.  Ventricular tachycardia: Seen on device interrogation.  Plan to start mexiletine today.  Due to his systolic heart failure and coronary artery disease, he would not  be able to take flecainide or propafenone due to the black box warning.  He did have side effects on amiodarone.  Should he have further VT on mexiletine, he may need to restart amiodarone.  We have also adjusted his device settings for more sensitive detection and therapy.  Current medicines are reviewed at length with the patient today.   The patient does not have concerns regarding his medicines.  The following changes were made today: Start mexiletine  Labs/ tests ordered today include: ECG Orders Placed This Encounter  Procedures  . Basic Metabolic Panel (BMET)  . Pro b natriuretic peptide (BNP)  . CUP PACEART Van  . EKG 12-Lead     Disposition:   FU with Dimitrious Micciche 3 months  Signed, all of Brizeyda Holtmeyer Meredith Leeds, MD  04/18/2017 11:16 AM     [facility] [address] [place] [address] [phone] (office) [phone] (fax)

## 2017-04-22 ENCOUNTER — Telehealth: Payer: Self-pay

## 2017-04-22 ENCOUNTER — Ambulatory Visit: Payer: Medicare HMO | Admitting: Pulmonary Disease

## 2017-04-22 ENCOUNTER — Encounter: Payer: Self-pay | Admitting: Pulmonary Disease

## 2017-04-22 VITALS — BP 108/62 | HR 75 | Ht 72.0 in | Wt 196.4 lb

## 2017-04-22 DIAGNOSIS — T462X4A Poisoning by other antidysrhythmic drugs, undetermined, initial encounter: Secondary | ICD-10-CM

## 2017-04-22 DIAGNOSIS — J849 Interstitial pulmonary disease, unspecified: Secondary | ICD-10-CM

## 2017-04-22 NOTE — Patient Instructions (Signed)
I am glad your breathing is improving.  We will need to do a CT chest without contrast in 1-2 months time for follow-up of lung nodule I will see you back in clinic after the CT scan to review results and plan for further testing

## 2017-04-22 NOTE — Progress Notes (Signed)
[name]    [id number]    1932/08/11  Primary Care Physician:Meyers, Annie Main, MD  Referring Physician: Orpah [name], [name],  [id number]  Chief complaint: Follow up for abnormal pulmonary function test. ? Amiodarone toxicity  HPI: Mr. [name] is an 84 year old with past medical history history of cardiomyopathy, atrial fibrillation on amiodarone and Eliquis anticoagulation, severe AS with aortic wall replacement , coronary artery disease cardiac myopathy [EF 50-20 percent]. He has been maintained on amiodarone since 2016 with regular PFTs. 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  He has some mild dyspnea on exertion but denies any other respiratory complaint. He does not any cough, sputum production, wheezing, chest pain, palpitations. He is a lifelong nonsmoker. He used to work as a Dealer, Pension scheme manager, Lobbyist. He does not report any exposure to asbestos.  Interim History: He had a CT scan done in November 2018 which showed bilateral small pulmonary nodules.  During that time he had worsening dyspnea on exertion.  Due to concern for amiodarone toxicity this was held and he was given a short prednisone taper. He has followed up with cardiology and noted to have increasing ventricular tachycardia.  The plan is to start mexiletine.  If his VT continues then he may need to be restarted on amiodarone. He reports that his breathing is improving.  His chief complaint today is nausea, loss of appetite and weight loss.  Outpatient Encounter Medications as of 04/22/2017  Medication Sig  . atorvastatin (LIPITOR) 20 MG tablet TAKE 1 TABLET (20 MG TOTAL) DAILY AT 6 PM.  . carvedilol (COREG) 12.5 MG tablet Take 1 tablet (12.5 mg total) by mouth 2 (two) times daily with a meal.  . ELIQUIS 2.5 MG TABS tablet TAKE 1 TABLET TWICE DAILY  . furosemide (LASIX) 20 MG tablet  TAKE 1 TABLET (20 MG TOTAL) BY MOUTH DAILY.  . hydrALAZINE (APRESOLINE) 25 MG tablet Take 0.5 tablets (12.5 mg total) by mouth 2 (two) times daily before a meal.  . isosorbide mononitrate (IMDUR) 30 MG 24 hr tablet TAKE 1 TABLET (30 MG TOTAL) BY MOUTH DAILY.  Marland Kitchen mexiletine (MEXITIL) 250 MG capsule Take 1 capsule (250 mg total) by mouth 2 (two) times daily.  . Multiple Vitamin (MULTIVITAMIN) capsule Take 1 capsule by mouth daily.  Marland Kitchen omega-3 acid ethyl esters (LOVAZA) 1 G capsule Take 2 g by mouth 2 (two) times daily.  . ranitidine (ZANTAC) 300 MG tablet Take 300 mg by mouth at bedtime.  . traMADol (ULTRAM) 50 MG tablet Take 1 tablet by mouth as directed.  Marland Kitchen acetaminophen (TYLENOL) 500 MG tablet Take 500 mg by mouth every 6 (six) hours as needed for moderate pain.   No facility-administered encounter medications on file as of 04/22/2017.     Allergies as of 04/22/2017 - Review Complete 04/22/2017  Allergen Reaction Noted  . Novocain [procaine] Swelling 08/22/2014    Past Medical History:  Diagnosis Date  . AICD (automatic cardioverter/defibrillator) present   . Aortic valve disorder 2009   severe AS s/p AVR with pericardial tissue valve  . Arthritis    "minor everywhere" (04/22/2015)  . Asthma    "a touch"  . Barrett esophagus   . CAD (coronary artery disease)    s/p 2 vessel CABG with LIMA to LAD, SVG to diagonal 1, SVG to RCA 12/09  . Cardiomyopathy ([place])  EF 15-20% by echo 2017  . Chronic combined systolic and diastolic CHF (congestive heart failure) (HCC)    dry weight 197-200lbs.  . CKD (chronic kidney disease)   . Diabetes ([place])    "I'm prediabetic" (04/22/2015)  . GERD (gastroesophageal reflux disease)   . H/O: rheumatic fever   . Hepatitis 1957   "don't know what kind:  . History of hiatal hernia   . History of PFTs    a. Amiodarone started 5/16 >> PFTs w/ DLCO 6/16:  FEV1 72% predicted, FEV1/FVC 66%, DLCO 66% >> minimal reversible obstructive airways disease with mild  diffusion defect (suggestive of emphysema but absence of hyperinflation inconsistent with dx)  . HTN (hypertension)   . Hypercholesteremia   . Orthostatic hypotension   . Persistent atrial fibrillation ([place])    post op afib with no reoccurence  . Pneumonia 08/2014   "dr thought I may have had a touch"    Past Surgical History:  Procedure Laterality Date  . AORTIC VALVE REPLACEMENT  with 23-mm Magna Ease pericardial valve, model number   with 23-mm Magna Ease pericardial valve, model [id number], serial number [id number]   . APPENDECTOMY  1940s  . BI-VENTRICULAR IMPLANTABLE CARDIOVERTER DEFIBRILLATOR  (CRT-D)  04/22/2015  . CARDIAC CATHETERIZATION N/A 11/04/2014   Procedure: Left Heart Cath and Cors/Grafts Angiography;  Surgeon: [name] Man, MD;  Location: [facility];  Service: Cardiovascular;  Laterality: N/A;  . CARDIAC VALVE REPLACEMENT    . CARDIOVERSION N/A 01/27/2013   Procedure: CARDIOVERSION;  Surgeon: [name] Margarita, MD;  Location: [facility];  Service: Cardiovascular;  Laterality: N/A;  . CARDIOVERSION N/A 08/25/2014   Procedure: CARDIOVERSION;  Surgeon: [name] Klein, MD;  Location: [facility] ENDOSCOPY;  Service: Cardiovascular;  Laterality: N/A;  . CATARACT EXTRACTION W/ INTRAOCULAR LENS  IMPLANT, BILATERAL Bilateral   . CORONARY ARTERY BYPASS GRAFT  03/10/2008   x 3 Dr. Roxan [name]  . EP IMPLANTABLE DEVICE N/A 04/22/2015   Procedure: BiV ICD Insertion CRT-D;  Surgeon: [name] Meredith Leeds, MD;  Location: [facility];  Service: Cardiovascular;  Laterality: N/A;  . ESOPHAGOGASTRODUODENOSCOPY (EGD) WITH ESOPHAGEAL DILATION  X1  . TONSILLECTOMY      Family History  Problem Relation Age of Onset  . Alzheimer's disease Mother   . COPD Daughter   . Alzheimer's disease Unknown   . COPD Sister   . Depression Sister   . Heart disease Sister   . Hyperlipidemia Sister   . Hypertension Sister   . Esophageal cancer Sister        + smoker    Social History    Socioeconomic History  . Marital status: Married    Spouse name: Not on file  . Number of children: Not on file  . Years of education: Not on file  . Highest education level: Not on file  Social Needs  . Financial resource strain: Not on file  . Food insecurity - worry: Not on file  . Food insecurity - inability: Not on file  . Transportation needs - medical: Not on file  . Transportation needs - non-medical: Not on file  Occupational History  . Not on file  Tobacco Use  . Smoking status: Never Smoker  . Smokeless tobacco: Never Used  Substance and Sexual Activity  . Alcohol use: Yes    Comment: 04/22/2015 "nothing since the 1980s; never had a problem w/it"  . Drug use: No  . Sexual activity: No  Other Topics Concern  . 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  HENT: Negative.   Eyes: Negative for blurred vision.  Respiratory: as per HPI  Cardiovascular: Negative for chest pain and palpitations.  Gastrointestinal: Negative for vomiting, diarrhea, blood per rectum. Genitourinary: Negative for dysuria, urgency, frequency and hematuria.  Musculoskeletal: Negative for myalgias, back pain and joint pain.  Skin: Negative for itching and rash.  Neurological: Negative for dizziness, tremors, focal weakness, seizures and loss of consciousness.  Endo/Heme/Allergies: Negative for environmental allergies.  Psychiatric/Behavioral: Negative for depression, suicidal ideas and hallucinations.  All other systems reviewed and are negative.  Physical Exam: Blood pressure 108/62, pulse 75, height 6' (1.829 m), weight 196 lb 6.4 oz (89.1 kg), SpO2 99 %. Gen:      No acute distress HEENT:  EOMI, sclera anicteric Neck:     No masses; no  thyromegaly Lungs:    Clear to auscultation bilaterally; normal respiratory effort CV:         Regular rate and rhythm; no murmurs Abd:      + bowel sounds; soft, non-tender; no palpable masses, no distension Ext:    No edema; adequate peripheral perfusion Skin:      Warm and dry; no rash Neuro: alert and oriented x 3 Psych: normal mood and affect  Data Reviewed: PFTs 02/01/17 FVC 3.94 [92%], FEV1 2.21 [72%], F/F 56, TLC 103%, DLCO 40% Mild obstructive airway disease with diffusion defect.  Stable compared to 2017.  PFTs 02/03/16 FVC 4.32 [100%), FEV1 2.41 [78%], F/F 56, TLC 105%, DLCO 42% Mild obstructive disease with severe diffusion defect. Compared to 2016 has been a decline in the diffusion capacity.  PFTs 09/13/14 FVC 4.22 [96%), FEV1 2.49 (79%), F/F 59, TLC 102%., DLCO 66% Mild obstructive airway disease, mild diffusion defect  High-resolution CT chest 05/08/16-no interstitial lung disease.  Enlarged pulmonary artery Chest x-ray 02/25/17-no acute lung abnormality. High-resolution CT chest 03/05/17-new nodular opacities noted bilaterally with the largest one is 10 mm in the right upper lobe.  Emphysematous changes, bronchial wall thickening I have reviewed all images personally  Assessment:  Lung nodules Concern for amiodarone pulmonary toxicity. The most recent PFT from Oct 2018 shows stability in diffusion capacity and lung volumes compared to 2017 however he is more symptomatic with increased dyspnea.  CT scan shows scattered pulmonary nodules.  Amiodarone was held out of concern for toxicity. [name] need to continue monitoring his lung nodules to make sure there is no growth of malignancy.  Follow-up CT to be ordered in 1-2 months.  Follow-up in clinic after CT scan for review.  Mild COPD PFTs show mild obstruction with mild emphysematous changes on CT scan.  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  Dyspnea is currently stable off inhalers.  We [name] continue  to monitor his symptoms.  Plan/Recommendations: - Follow-up CT of the chest without contrast  Marshell Garfinkel MD [facility] Pager [phone] 04/22/2017, 9:02 AM  CC: [name] Melter, MD

## 2017-04-22 NOTE — Telephone Encounter (Signed)
**Note De-Identified Maneh Sieben Obfuscation** I attempted to do a Mexiletine PA through covermymeds but was unable to as I received this message:  Why does the patient need this drug instead of one of [employer]'s lower cost drugs on the formulary? If no clinical reason is given, the understanding is that none exists.  The medications that are on the pts formulary is: Amiodarone (the pt has tired) Propafenone Flecainide  Mexiletine is non formulary so the pts insurance will not cover until or unless the pt has tried and failed Propafenone and Flecainide.

## 2017-04-23 ENCOUNTER — Ambulatory Visit: Payer: Medicare HMO | Admitting: Physician Assistant

## 2017-04-23 ENCOUNTER — Encounter: Payer: Self-pay | Admitting: Physician Assistant

## 2017-04-23 ENCOUNTER — Encounter (INDEPENDENT_AMBULATORY_CARE_PROVIDER_SITE_OTHER): Payer: Self-pay

## 2017-04-23 VITALS — BP 160/76 | HR 69 | Ht 72.0 in | Wt 196.8 lb

## 2017-04-23 DIAGNOSIS — I5042 Chronic combined systolic (congestive) and diastolic (congestive) heart failure: Secondary | ICD-10-CM

## 2017-04-23 DIAGNOSIS — I10 Essential (primary) hypertension: Secondary | ICD-10-CM

## 2017-04-23 DIAGNOSIS — R634 Abnormal weight loss: Secondary | ICD-10-CM | POA: Diagnosis not present

## 2017-04-23 DIAGNOSIS — E785 Hyperlipidemia, unspecified: Secondary | ICD-10-CM | POA: Diagnosis not present

## 2017-04-23 DIAGNOSIS — R42 Dizziness and giddiness: Secondary | ICD-10-CM | POA: Insufficient documentation

## 2017-04-23 DIAGNOSIS — I2510 Atherosclerotic heart disease of native coronary artery without angina pectoris: Secondary | ICD-10-CM | POA: Diagnosis not present

## 2017-04-23 DIAGNOSIS — I480 Paroxysmal atrial fibrillation: Secondary | ICD-10-CM | POA: Diagnosis not present

## 2017-04-23 DIAGNOSIS — I2583 Coronary atherosclerosis due to lipid rich plaque: Secondary | ICD-10-CM | POA: Diagnosis not present

## 2017-04-23 MED ORDER — ISOSORBIDE MONONITRATE ER 30 MG PO TB24: 15.0000 mg | ORAL_TABLET | Freq: Every day | ORAL | 2 refills | Status: DC

## 2017-04-23 NOTE — Progress Notes (Signed)
Cardiology Office Note    Date:  04/23/2017   ID:  [name], DOB [birth year], MRN [id number]  PCP:  [name], MD  Cardiologist: [name], MD  EPS Dr. Curt Bears  No chief complaint on file.   History of Present Illness:  [name] is a 84 y.o. male with history of hypertension, status post CABG and pericardial tissue AVR 2009, dilated cardiomyopathy ejection fraction 25-30% chronic combined systolic and diastolic CHF in the setting of atrial fib with RVR, orthostatic hypotension, CRT-D upgrade 04/22/15.  Saw Dr. Curt Bears 04/18/17 and device interrogation showed multiple episodes of ventricular tachycardia below his detection limit.  He has had fatigue and weakness that could be due to V. Tach.  Patient had been taken off amiodarone due to shortness of breath.  Dr. Curt Bears lowered his monitor zone and detection zone and started him on mexiletine.  If this does not help his rhythm abnormalities he may need to be restarted on amiodarone in the future.  Patient has PAF and is on Eliquis but not antiarrhythmics for this.  Was in sinus rhythm.  Last saw Dr. Radford Pax 01/2017 and was felt to be compensated.   Patient comes in today by himself.  He complains of significant dizziness in the morning associated with low blood pressures.  After couple hours he feels better when his blood pressure comes up.  He is got associated nausea with it.  He is lost 18 pounds in 2 weeks.  He feels very dry.  He drank extra water this morning.  BNP 5 days ago was 10,000.  He says this is been going on for a couple weeks prior to starting the mexiletine.  Past Medical History:  Diagnosis Date  . AICD (automatic cardioverter/defibrillator) present   . Aortic valve disorder 2009   severe AS s/p AVR with pericardial tissue valve  . Arthritis    "minor everywhere" (04/22/2015)  . Asthma    "a touch"  . Barrett esophagus   . CAD (coronary artery disease)    s/p 2 vessel CABG with LIMA to LAD, SVG  to diagonal 1, SVG to RCA 12/09  . Cardiomyopathy ([place])    EF 15-20% by echo 2017  . Chronic combined systolic and diastolic CHF (congestive heart failure) (HCC)    dry weight 197-200lbs.  . CKD (chronic kidney disease)   . Diabetes ([place])    "I'm prediabetic" (04/22/2015)  . GERD (gastroesophageal reflux disease)   . H/O: rheumatic fever   . Hepatitis 1957   "don't know what kind:  . History of hiatal hernia   . History of PFTs    a. Amiodarone started 5/16 >> PFTs w/ DLCO 6/16:  FEV1 72% predicted, FEV1/FVC 66%, DLCO 66% >> minimal reversible obstructive airways disease with mild diffusion defect (suggestive of emphysema but absence of hyperinflation inconsistent with dx)  . HTN (hypertension)   . Hypercholesteremia   . Orthostatic hypotension   . Persistent atrial fibrillation ([place])    post op afib with no reoccurence  . Pneumonia 08/2014   "dr thought I may have had a touch"    Past Surgical History:  Procedure Laterality Date  . AORTIC VALVE REPLACEMENT  with 23-mm Magna Ease pericardial valve, model number   with 23-mm Magna Ease pericardial valve, model [id number], serial number [id number]   . APPENDECTOMY  1940s  . BI-VENTRICULAR IMPLANTABLE CARDIOVERTER DEFIBRILLATOR  (CRT-D)  04/22/2015  . CARDIAC CATHETERIZATION N/A 11/04/2014   Procedure: Left Heart Cath and Cors/Grafts  Angiography;  Surgeon: Leonie Man, MD;  Location: [facility];  Service: Cardiovascular;  Laterality: N/A;  . CARDIAC VALVE REPLACEMENT    . CARDIOVERSION N/A 01/27/2013   Procedure: CARDIOVERSION;  Surgeon: Sueanne Margarita, MD;  Location: [facility];  Service: Cardiovascular;  Laterality: N/A;  . CARDIOVERSION N/A 08/25/2014   Procedure: CARDIOVERSION;  Surgeon: Sanda Klein, MD;  Location: [facility];  Service: Cardiovascular;  Laterality: N/A;  . CATARACT EXTRACTION W/ INTRAOCULAR LENS  IMPLANT, BILATERAL Bilateral   . CORONARY ARTERY BYPASS GRAFT  03/10/2008   x 3 Dr. Roxan Hockey  . EP  IMPLANTABLE DEVICE N/A 04/22/2015   Procedure: BiV ICD Insertion CRT-D;  Surgeon: [name], MD;  Location: [facility];  Service: Cardiovascular;  Laterality: N/A;  . ESOPHAGOGASTRODUODENOSCOPY (EGD) WITH ESOPHAGEAL DILATION  X1  . TONSILLECTOMY      Current Medications: Current Meds  Medication Sig  . acetaminophen (TYLENOL) 500 MG tablet Take 500 mg by mouth every 6 (six) hours as needed for moderate pain.  [name] Kitchen atorvastatin (LIPITOR) 20 MG tablet TAKE 1 TABLET (20 MG TOTAL) DAILY AT 6 PM.  . carvedilol (COREG) 6.25 MG tablet Take 6.25 mg by mouth 2 (two) times daily with a meal.  . ELIQUIS 2.5 MG TABS tablet TAKE 1 TABLET TWICE DAILY  . furosemide (LASIX) 20 MG tablet TAKE 1 TABLET (20 MG TOTAL) BY MOUTH DAILY.  . hydrALAZINE (APRESOLINE) 25 MG tablet Take 0.5 tablets (12.5 mg total) by mouth 2 (two) times daily before a meal.  . isosorbide mononitrate (IMDUR) 30 MG 24 hr tablet Take 0.5 tablets (15 mg total) by mouth at bedtime.  . mexiletine (MEXITIL) 250 MG capsule Take 1 capsule (250 mg total) by mouth 2 (two) times daily.  . Multiple Vitamin (MULTIVITAMIN) capsule Take 1 capsule by mouth daily.  [name] Kitchen omega-3 acid ethyl esters (LOVAZA) 1 G capsule Take 2 g by mouth 2 (two) times daily.  . ranitidine (ZANTAC) 300 MG tablet Take 300 mg by mouth at bedtime.  . traMADol (ULTRAM) 50 MG tablet Take 1 tablet by mouth as directed.  . [DISCONTINUED] isosorbide mononitrate (IMDUR) 30 MG 24 hr tablet TAKE 1 TABLET (30 MG TOTAL) BY MOUTH DAILY.     Allergies:   Novocain [procaine]   Social History   Socioeconomic History  . Marital status: Married    Spouse name: None  . Number of children: None  . Years of education: None  . Highest education level: None  Social Needs  . Financial resource strain: None  . Food insecurity - worry: None  . Food insecurity - inability: None  . Transportation needs - medical: None  . Transportation needs - non-medical: None  Occupational  History  . None  Tobacco Use  . Smoking status: Never Smoker  . Smokeless tobacco: Never Used  Substance and Sexual Activity  . Alcohol use: Yes    Comment: 04/22/2015 "nothing since the 1980s; never had a problem w/it"  . Drug use: No  . Sexual activity: No  Other Topics Concern  . None  Social History Narrative   Married, lives with spouse [name]   4 children, 2 boys and 2 girls > one daughter passed   OCCUPATION: retired, Pension scheme manager for [employer]     Family History:  The patient's family history includes Alzheimer's disease in his mother and unknown relative; COPD in his daughter and sister; Depression in his sister; Esophageal cancer in his sister; Heart disease in  his sister; Hyperlipidemia in his sister; Hypertension in his sister.   ROS:   Please see the history of present illness.    Review of Systems  Constitution: Positive for decreased appetite and weight loss.  HENT: Positive for hearing loss.   Eyes: Positive for visual disturbance.  Cardiovascular: Negative.   Respiratory: Negative.   Endocrine: Negative.   Hematologic/Lymphatic: Negative.   Musculoskeletal: Negative.   Gastrointestinal: Positive for anorexia and nausea.  Genitourinary: Negative.   Neurological: Positive for dizziness.   All other systems reviewed and are negative.   PHYSICAL EXAM:   VS:  BP (!) 160/76 (BP Location: Left Arm, Patient Position: Sitting, Cuff Size: Normal)   Pulse 69   Ht 6' (1.829 m)   Wt 196 lb 12.8 oz (89.3 kg)   SpO2 98% Comment: at rest  BMI 26.69 kg/m   Physical Exam  GEN: Thin, elderly, in no acute distress  Neck: no JVD, carotid bruits, or masses Cardiac:RRR; no murmurs, rubs, or gallops  Respiratory:  clear to auscultation bilaterally, normal work of breathing GI: soft, nontender, nondistended, + BS Ext: without cyanosis, clubbing, or edema, Good distal pulses bilaterally MS: no deformity or atrophy  Skin: Tenting Neuro:  Alert and Oriented x  3 Psych: euthymic mood, full affect  Wt Readings from Last 3 Encounters:  04/23/17 196 lb 12.8 oz (89.3 kg)  04/22/17 196 lb 6.4 oz (89.1 kg)  04/18/17 199 lb 3.2 oz (90.4 kg)      Studies/Labs Reviewed:   EKG:  EKG is not ordered today.    Recent Labs: 01/28/2017: ALT 37; Hemoglobin 15.0; Platelets 277; TSH 1.630 04/18/2017: BUN 24; Creatinine, Ser 1.37; NT-Pro BNP 10,928; Potassium 5.0; Sodium 139   Lipid Panel    Component Value Date/Time   CHOL 126 07/30/2016 1143   CHOL 93 (L) 06/16/2014 0914   TRIG 154 (H) 07/30/2016 1143   TRIG 124 06/16/2014 0914   HDL 45 07/30/2016 1143   HDL 34 (L) 06/16/2014 0914   CHOLHDL 2.8 07/30/2016 1143   CHOLHDL 2.7 01/19/2015 1005   VLDL 32 (H) 01/19/2015 1005   LDLCALC 50 07/30/2016 1143   LDLCALC 34 06/16/2014 0914    Additional studies/ records that were reviewed today include:      ASSESSMENT:    1. Dizziness   2. Weight loss   3. Coronary artery disease due to lipid rich plaque   4. Chronic combined systolic and diastolic heart failure ([place])   5. Essential hypertension   6. Paroxysmal atrial fibrillation (HCC)   7. Aortic stenosis, severe   8. Dyslipidemia      PLAN:  In order of problems listed above:  Dizziness with 18 pound weight loss in 2 weeks according to his scales 13 pounds by our scales since October.  He is not orthostatic.  He is dizzy after he takes his morning medications as well as nausea.  His BNP was 10,005 days ago.  His Lasix was not increased.  We will repeat today as well as full labs before making an adjustment.  We will decrease Imdur to 15 mg and asked him to take it in the evening.  He does not think mexiletine is causing this as it started before he began mexiletine.  CAD status post CABG and AVR stable without angina  Chronic combined systolic and diastolic CHF compensated.  Is on low-dose Lasix and hydralazine and had 5 hospitalizations last year for heart failure.  He is reluctant to stop  the  Lasix.  Will await his labs.  Essential hypertension blood pressures been running low after he takes his morning meds.  Blood pressure is back up in our office.  Changing the way he takes them and decreasing Imdur.  PAF on Eliquis.  Followed by Dr. Curt Bears  Dyslipidemia on Lipitor  Medication Adjustments/Labs and Tests Ordered: Current medicines are reviewed at length with the patient today.  Concerns regarding medicines are outlined above.  Medication changes, Labs and Tests ordered today are listed in the Patient Instructions below. There are no Patient Instructions on file for this visit.   Sumner Boast, PA-C  04/23/2017 10:38 AM    [facility] [address], [place], [place]  [address] Phone: [phone]; Fax: [phone]

## 2017-04-23 NOTE — Patient Instructions (Signed)
Medication Instructions:  Your physician has recommended you make the following change in your medication:  1. Decrease Imdur to one half tablet (15 mg) at bedtime.    Labwork: Your physician recommends that you have lab work today: cmet/bnp/cbc   Testing/Procedures: -None  Follow-Up: Your physician recommends that you keep your scheduled  follow-up appointment with Estella Husk, PA. Your physician recommends that you keep your scheduled  follow-up appointment with Dr. Radford Pax.   Any Other Special Instructions Will Be Listed Below (If Applicable).  Will call with lasix changes after lab results are in. Need to call Dr. Olen Pel office about weight loss and set up appointment.   If you need a refill on your cardiac medications before your next appointment, please call your pharmacy.

## 2017-04-24 ENCOUNTER — Telehealth: Payer: Self-pay | Admitting: Cardiology

## 2017-04-24 LAB — CBC WITH DIFFERENTIAL/PLATELET
Basophils Absolute: 0 10*3/uL (ref 0.0–0.2)
Basos: 0 %
EOS (ABSOLUTE): 0.1 10*3/uL (ref 0.0–0.4)
EOS: 1 %
HEMATOCRIT: 41.4 % (ref 37.5–51.0)
HEMOGLOBIN: 13.6 g/dL (ref 13.0–17.7)
Immature Grans (Abs): 0 10*3/uL (ref 0.0–0.1)
Immature Granulocytes: 0 %
LYMPHS: 20 %
Lymphocytes Absolute: 1.9 10*3/uL (ref 0.7–3.1)
MCH: 28.5 pg (ref 26.6–33.0)
MCHC: 32.9 g/dL (ref 31.5–35.7)
MCV: 87 fL (ref 79–97)
Monocytes Absolute: 1.3 10*3/uL — ABNORMAL HIGH (ref 0.1–0.9)
Monocytes: 14 %
Neutrophils Absolute: 6.2 10*3/uL (ref 1.4–7.0)
Neutrophils: 65 %
Platelets: 251 10*3/uL (ref 150–379)
RBC: 4.77 x10E6/uL (ref 4.14–5.80)
RDW: 13.3 % (ref 12.3–15.4)
WBC: 9.6 10*3/uL (ref 3.4–10.8)

## 2017-04-24 LAB — COMPREHENSIVE METABOLIC PANEL
ALT: 40 IU/L (ref 0–44)
AST: 35 IU/L (ref 0–40)
Albumin/Globulin Ratio: 1.6 (ref 1.2–2.2)
Albumin: 3.9 g/dL (ref 3.5–4.7)
Alkaline Phosphatase: 60 IU/L (ref 39–117)
BUN/Creatinine Ratio: 18 (ref 10–24)
BUN: 29 mg/dL — ABNORMAL HIGH (ref 8–27)
Bilirubin Total: 0.8 mg/dL (ref 0.0–1.2)
CALCIUM: 9 mg/dL (ref 8.6–10.2)
CO2: 21 mmol/L (ref 20–29)
CREATININE: 1.59 mg/dL — AB (ref 0.76–1.27)
Chloride: 102 mmol/L (ref 96–106)
GFR calc Af Amer: 46 mL/min/{1.73_m2} — ABNORMAL LOW (ref >59)
GFR, EST NON AFRICAN AMERICAN: 40 mL/min/{1.73_m2} — AB (ref >59)
GLOBULIN, TOTAL: 2.4 g/dL (ref 1.5–4.5)
Glucose: 115 mg/dL — ABNORMAL HIGH (ref 65–99)
Potassium: 4.9 mmol/L (ref 3.5–5.2)
Sodium: 140 mmol/L (ref 134–144)
Total Protein: 6.3 g/dL (ref 6.0–8.5)

## 2017-04-24 LAB — PRO B NATRIURETIC PEPTIDE: NT-Pro BNP: 10848 pg/mL — ABNORMAL HIGH (ref 0–486)

## 2017-04-24 NOTE — Telephone Encounter (Signed)
Dr. Curt [name] has addended his note to clarify needed information for insurance: Note states: 3.  Ventricular tachycardia: Seen on device interrogation.  Plan to start mexiletine today.  Due to his systolic heart failure and coronary artery disease, he would not be able to take flecainide or propafenone due to the black box warning.  He did have side effects on amiodarone.  Should he have further VT on mexiletine, he may need to restart amiodarone.  We have also adjusted his device settings for more sensitive detection and therapy.  Will forward back to prior auth dept to address with insurance. Pt updated on status of things.

## 2017-04-24 NOTE — Telephone Encounter (Signed)
New message      Pt c/o medication issue:  1. Name of Medication: mexiletine  2. How are you currently taking this medication (dosage and times per day)? 227m   3. Are you having a reaction (difficulty breathing--STAT)? no  4. What is your medication issue? Insurance will not cover this medication , needs a replacement medication called in

## 2017-04-24 NOTE — Telephone Encounter (Signed)
Informed pharmacy that we are working on approval for this medication.

## 2017-04-26 ENCOUNTER — Telehealth: Payer: Self-pay | Admitting: Cardiology

## 2017-04-26 NOTE — Telephone Encounter (Signed)
Patient notified also

## 2017-04-26 NOTE — Telephone Encounter (Signed)
Discussed w/ Dr. Curt Bears and informed that pt has gotten approval for Mexiletine, finally.  Explained that he has not started medication d/t insurance issues - which we resolved today and received approval. Informed that pt is not coming in to check his device today. Dr Curt Bears agreeable to pt starting Mexiletine and see how pt does.

## 2017-04-26 NOTE — Telephone Encounter (Signed)
Agree with patient coming in for interrogation.

## 2017-04-26 NOTE — Telephone Encounter (Signed)
New message    [employer] calling to give nurse abnormal EKG result.

## 2017-04-26 NOTE — Telephone Encounter (Signed)
**Note De-Identified Kipp Shank Obfuscation** I have done an urgent Mexiletine PA through covermymeds.

## 2017-04-26 NOTE — Telephone Encounter (Signed)
**Note De-Identified [name] Obfuscation** Approval on Mexiletine received [name] fax from [facility]. Approval good until 04/08/2018.  I have notified [facility] of the approval.

## 2017-04-26 NOTE — Telephone Encounter (Addendum)
Spoke with Angie with [employer]. Patient's monitor is showing wide complex tachycardia, paced rhythm, intrinsic beats at 9:59 am today. Called patient to see what he was doing at the time. Patient stated he was just washing the dishes. Patient stated he has been feeling SOB at times, but has improved since stopping Amiodarone. Patient also complained of having weakness and nausea, but this is not new. Patient denies any chest pain, and his BP 135/80 and HR 75 this morning. Had DOD, Dr. Rayann Heman, review [employer] critical episode. Dr. Rayann Heman advised to show Dr. Curt Bears, patient's EP doctor, since patient is asymptomatic. Will send message to Dr. Curt Bears, so he is aware. Will have report scanned into patient's chart today for Dr. Curt Bears to review.

## 2017-04-28 ENCOUNTER — Telehealth: Payer: Self-pay | Admitting: Cardiology

## 2017-04-28 NOTE — Telephone Encounter (Signed)
I was notified to call [employer] re the patient. He reportedly had 7s of WCT c/w VT at 156 bpm. This episode resolved spontaneously. Attempts to reach the patient were unsuccessful.   Per chart review, the patient has been experiencing slow VT that is treated with Mexilitine.

## 2017-04-29 DIAGNOSIS — R634 Abnormal weight loss: Secondary | ICD-10-CM | POA: Diagnosis not present

## 2017-04-30 ENCOUNTER — Telehealth: Payer: Self-pay

## 2017-04-30 ENCOUNTER — Telehealth: Payer: Self-pay | Admitting: Cardiology

## 2017-04-30 NOTE — Telephone Encounter (Signed)
Received call from [place] as patient had about 30 secs of WCT at a rate of 117 bpm. Pt has been started on mexiletine per office notes. Patient was asymptomatic at the time of event. Instructed to continue with current therapy.   SignedReino Bellis, NP-C 04/30/2017, 5:28 PM Pager: [phone]

## 2017-04-30 NOTE — Telephone Encounter (Signed)
Reached out to patient regarding tachycardic episode from 04/27/16. Patient states he was sleeping at that time and does recall having any symptoms. Patient states he started his first dose of mexilitine 250 mg BID. Patient states he feels okay. Patient denies chest pain, sob, dizziness or syncope. Informed patient to call if he has any questions or new symptoms. Patient verbalized understanding and thanked me for the all.

## 2017-05-01 ENCOUNTER — Telehealth: Payer: Self-pay | Admitting: *Deleted

## 2017-05-01 NOTE — Telephone Encounter (Signed)
Pt reports that he just started Mexiletine today b/c he had to "wait on it". Will re-discuss w/ [place] tomorrow.

## 2017-05-03 NOTE — Telephone Encounter (Signed)
Discussed w/ [place] -- will continue to monitor (no increase in medication at this time needed)

## 2017-05-06 ENCOUNTER — Encounter: Payer: Self-pay | Admitting: Physician Assistant

## 2017-05-06 ENCOUNTER — Encounter (INDEPENDENT_AMBULATORY_CARE_PROVIDER_SITE_OTHER): Payer: Self-pay

## 2017-05-06 ENCOUNTER — Ambulatory Visit: Payer: Medicare HMO | Admitting: Physician Assistant

## 2017-05-06 VITALS — BP 156/72 | HR 68 | Ht 72.0 in | Wt 194.8 lb

## 2017-05-06 DIAGNOSIS — I481 Persistent atrial fibrillation: Secondary | ICD-10-CM

## 2017-05-06 DIAGNOSIS — I255 Ischemic cardiomyopathy: Secondary | ICD-10-CM

## 2017-05-06 DIAGNOSIS — I25758 Atherosclerosis of native coronary artery of transplanted heart with other forms of angina pectoris: Secondary | ICD-10-CM

## 2017-05-06 DIAGNOSIS — I4819 Other persistent atrial fibrillation: Secondary | ICD-10-CM

## 2017-05-06 DIAGNOSIS — I5042 Chronic combined systolic (congestive) and diastolic (congestive) heart failure: Secondary | ICD-10-CM

## 2017-05-06 DIAGNOSIS — I472 Ventricular tachycardia, unspecified: Secondary | ICD-10-CM

## 2017-05-06 HISTORY — DX: Ventricular tachycardia: I47.2

## 2017-05-06 HISTORY — DX: Ventricular tachycardia, unspecified: I47.20

## 2017-05-06 LAB — BASIC METABOLIC PANEL
BUN / CREAT RATIO: 20 (ref 10–24)
BUN: 30 mg/dL — AB (ref 8–27)
CALCIUM: 8.9 mg/dL (ref 8.6–10.2)
CO2: 20 mmol/L (ref 20–29)
Chloride: 101 mmol/L (ref 96–106)
Creatinine, Ser: 1.53 mg/dL — ABNORMAL HIGH (ref 0.76–1.27)
GFR calc non Af Amer: 42 mL/min/{1.73_m2} — ABNORMAL LOW (ref >59)
GFR, EST AFRICAN AMERICAN: 48 mL/min/{1.73_m2} — AB (ref >59)
Glucose: 118 mg/dL — ABNORMAL HIGH (ref 65–99)
Potassium: 4.5 mmol/L (ref 3.5–5.2)
Sodium: 139 mmol/L (ref 134–144)

## 2017-05-06 LAB — PRO B NATRIURETIC PEPTIDE: NT-Pro BNP: 22420 pg/mL — ABNORMAL HIGH (ref 0–486)

## 2017-05-06 NOTE — Patient Instructions (Addendum)
Medication Instructions:  Your physician recommends that you continue on your current medications as directed. Please refer to the Current Medication list given to you today.   Labwork: TODAY:  BMET & PRO BNP  Testing/Procedures: None ordered  Follow-Up: Your physician recommends that you schedule a follow-up appointment in: [place]    Any Other Special Instructions Will Be Listed Below (If Applicable).   If you need a refill on your cardiac medications before your next appointment, please call your pharmacy.

## 2017-05-06 NOTE — Progress Notes (Signed)
Cardiology Office Note    Date:  05/06/2017   ID:  [name], DOB 1932-10-28, MRN [id number]  PCP:  [name], MD  Cardiologist: [name], MD  EPS: Dr. Curt Bears  Chief Complaint  Patient presents with  . Follow-up    History of Present Illness:  [name] is a 84 y.o. male with history of hypertension, status post CABG and pericardial tissue AVR 2009, dilated cardiomyopathy ejection fraction 25-30% chronic combined systolic and diastolic CHF in the setting of atrial fib with RVR, orthostatic hypotension, CRT-D upgrade 04/22/15.   Saw Dr. Curt Bears 04/18/17 and device interrogation showed multiple episodes of ventricular tachycardia below his detection limit.  He has had fatigue and weakness that could be due to V. Tach.  Patient had been taken off amiodarone due to shortness of breath.  Dr. Curt Bears lowered his monitor zone and detection zone and started him on mexiletine.  If this does not help his rhythm abnormalities he may need to be restarted on amiodarone in the future.  Patient has PAF and is on Eliquis but not antiarrhythmics for this.  Was in sinus rhythm.  Patient had 30 seconds of wide complex tachycardia rate of 117 bpm 04/30/17 but had test start his mexiletine because of insurance reasons so Dr. Curt Bears wanted to continue to monitor this.   I saw the patient 04/23/17 which time he was having significant dizziness with low blood pressures in the morning.  He had lost 18 pounds in 2 weeks, 13 pounds on our scales.  He was not orthostatic.  BNP 5 days prior to that was 10,000.  I decreased his Imdur to 15 mg daily.  I repeated his BNP that day and it was still over 10,000.  I did not increase his Lasix because he was feeling dry and hypotensive.  I told him he could take extra if he felt like he had edema.  Patient comes in today for follow-up.  He says he feels dry taking Lasix 20 mg twice a day.  He has no appetite but his nausea is improved.  He saw his primary  care last week who told him to take protein shakes.  Wants to hold off on seeing a gastroenterologist for now.   Past Medical History:  Diagnosis Date  . AICD (automatic cardioverter/defibrillator) present   . Aortic valve disorder 2009   severe AS s/p AVR with pericardial tissue valve  . Arthritis    "minor everywhere" (04/22/2015)  . Asthma    "a touch"  . Barrett esophagus   . CAD (coronary artery disease)    s/p 2 vessel CABG with LIMA to LAD, SVG to diagonal 1, SVG to RCA 12/09  . Cardiomyopathy ([place])    EF 15-20% by echo 2017  . Chronic combined systolic and diastolic CHF (congestive heart failure) (HCC)    dry weight 197-200lbs.  . CKD (chronic kidney disease)   . Diabetes ([place])    "I'm prediabetic" (04/22/2015)  . GERD (gastroesophageal reflux disease)   . H/O: rheumatic fever   . Hepatitis 1957   "don't know what kind:  . History of hiatal hernia   . History of PFTs    a. Amiodarone started 5/16 >> PFTs w/ DLCO 6/16:  FEV1 72% predicted, FEV1/FVC 66%, DLCO 66% >> minimal reversible obstructive airways disease with mild diffusion defect (suggestive of emphysema but absence of hyperinflation inconsistent with dx)  . HTN (hypertension)   . Hypercholesteremia   . Orthostatic hypotension   .  Persistent atrial fibrillation ([place])    post op afib with no reoccurence  . Pneumonia 08/2014   "dr thought I may have had a touch"    Past Surgical History:  Procedure Laterality Date  . AORTIC VALVE REPLACEMENT  with 23-mm Magna Ease pericardial valve, model number   with 23-mm Magna Ease pericardial valve, model [id number], serial number [id number]   . APPENDECTOMY  1940s  . BI-VENTRICULAR IMPLANTABLE CARDIOVERTER DEFIBRILLATOR  (CRT-D)  04/22/2015  . CARDIAC CATHETERIZATION N/A 11/04/2014   Procedure: Left Heart Cath and Cors/Grafts Angiography;  Surgeon: Leonie Man, MD;  Location: [facility];  Service: Cardiovascular;  Laterality: N/A;  . CARDIAC VALVE REPLACEMENT      . CARDIOVERSION N/A 01/27/2013   Procedure: CARDIOVERSION;  Surgeon: Sueanne Margarita, MD;  Location: [facility];  Service: Cardiovascular;  Laterality: N/A;  . CARDIOVERSION N/A 08/25/2014   Procedure: CARDIOVERSION;  Surgeon: Sanda Klein, MD;  Location: [facility];  Service: Cardiovascular;  Laterality: N/A;  . CATARACT EXTRACTION W/ INTRAOCULAR LENS  IMPLANT, BILATERAL Bilateral   . CORONARY ARTERY BYPASS GRAFT  03/10/2008   x 3 Dr. Roxan Hockey  . EP IMPLANTABLE DEVICE N/A 04/22/2015   Procedure: BiV ICD Insertion CRT-D;  Surgeon: [name], MD;  Location: [facility];  Service: Cardiovascular;  Laterality: N/A;  . ESOPHAGOGASTRODUODENOSCOPY (EGD) WITH ESOPHAGEAL DILATION  X1  . TONSILLECTOMY      Current Medications: No outpatient medications have been marked as taking for the 05/06/17 encounter (Office Visit) with Imogene Burn, PA-C.     Allergies:   Novocain [procaine]   Social History   Socioeconomic History  . Marital status: Married    Spouse name: None  . Number of children: None  . Years of education: None  . Highest education level: None  Social Needs  . Financial resource strain: None  . Food insecurity - worry: None  . Food insecurity - inability: None  . Transportation needs - medical: None  . Transportation needs - non-medical: None  Occupational History  . None  Tobacco Use  . Smoking status: Never Smoker  . Smokeless tobacco: Never Used  Substance and Sexual Activity  . Alcohol use: Yes    Comment: 04/22/2015 "nothing since the 1980s; never had a problem w/it"  . Drug use: No  . Sexual activity: No  Other Topics Concern  . None  Social History Narrative   Married, lives with spouse [name]   4 children, 2 boys and 2 girls > one daughter passed   OCCUPATION: retired, Pension scheme manager for [employer]     Family History:  The patient's family history includes Alzheimer's disease in his mother and unknown relative; COPD  in his daughter and sister; Depression in his sister; Esophageal cancer in his sister; Heart disease in his sister; Hyperlipidemia in his sister; Hypertension in his sister.   ROS:   Please see the history of present illness.    Review of Systems  Constitution: Negative.  HENT: Negative.   Cardiovascular: Negative.   Respiratory: Negative.   Endocrine: Negative.   Hematologic/Lymphatic: Negative.   Musculoskeletal: Negative.   Gastrointestinal: Negative.   Genitourinary: Negative.   Neurological: Negative.    All other systems reviewed and are negative.   PHYSICAL EXAM:   VS:  BP (!) 156/72   Pulse 68   Ht 6' (1.829 m)   Wt 194 lb 12.8 oz (88.4 kg)   SpO2 93%   BMI 26.42 kg/m  Physical Exam  GEN: Well nourished, well developed, elderly looking in no acute distress  Neck: no JVD, carotid bruits, or masses Cardiac:RRR; positive S4, 2/6 systolic murmur at the left sternal border Respiratory:  clear to auscultation bilaterally, normal work of breathing GI: soft, nontender, nondistended, + BS Ext: without cyanosis, clubbing, or edema, Good distal pulses bilaterally Neuro:  Alert and Oriented x 3 Psych: euthymic mood, full affect  Wt Readings from Last 3 Encounters:  05/06/17 194 lb 12.8 oz (88.4 kg)  04/23/17 196 lb 12.8 oz (89.3 kg)  04/22/17 196 lb 6.4 oz (89.1 kg)      Studies/Labs Reviewed:   EKG:  EKG is  ordered today.  The ekg ordered today demonstrates atrial sensed ventricular paced rhythm  Recent Labs: 01/28/2017: TSH 1.630 04/23/2017: ALT 40; BUN 29; Creatinine, Ser 1.59; Hemoglobin 13.6; NT-Pro BNP 10,848; Platelets 251; Potassium 4.9; Sodium 140   Lipid Panel    Component Value Date/Time   CHOL 126 07/30/2016 1143   CHOL 93 (L) 06/16/2014 0914   TRIG 154 (H) 07/30/2016 1143   TRIG 124 06/16/2014 0914   HDL 45 07/30/2016 1143   HDL 34 (L) 06/16/2014 0914   CHOLHDL 2.8 07/30/2016 1143   CHOLHDL 2.7 01/19/2015 1005   VLDL 32 (H) 01/19/2015 1005    LDLCALC 50 07/30/2016 1143   LDLCALC 34 06/16/2014 0914    Additional studies/ records that were reviewed today include:    TTE 02/02/15  Review of the above records today demonstrates:  - Left ventricle: The cavity size was mildly dilated. Systolic   function was severely reduced. The estimated ejection fraction   was in the range of 25% to 30%. There is akinesis of the   basal-midinferoseptal myocardium. There is akinesis of the   entireapical myocardium. There is akinesis of the   mid-apicallateral myocardium. There is akinesis of the   mid-apicalinferior myocardium. There is severe hypokinesis of the   entireinferolateral myocardium. There is akinesis of the   apicalanterior myocardium. There is akinesis of the   midanteroseptal myocardium. Features are consistent with a   pseudonormal left ventricular filling pattern, with concomitant   abnormal relaxation and increased filling pressure (grade 2   diastolic dysfunction). Doppler parameters are consistent with   high ventricular filling pressure. - Aortic valve: Poorly visualized. A bioprosthesis was present and   functioning normally. - Mitral valve: Calcified annulus. Mild diffuse calcification of   the anterior leaflet. There was mild regurgitation. - Left atrium: The atrium was severely dilated. - Tricuspid valve: There was trivial regurgitation.      ASSESSMENT:    1. CAD status post CABG   2. Chronic combined systolic and diastolic heart failure (HCC)   3. Cardiomyopathy, ischemic: EF roughly 30%   4. Ventricular tachycardia (HCC)   5. Persistent atrial fibrillation (HCC)      PLAN:  In order of problems listed above:  CAD status post CABG and pericardial tissue AVR in 2009 stable without angina  Chronic combined systolic and diastolic CHF weight down to more pounds for a total of 20 pounds on his scale 15 on ours.  Seems compensated today.  Patient feels dry on Lasix 20 mg twice daily.  Will check BNP and be  met today.  Last one was over 10,000.  Ischemic cardiomyopathy ejection fraction approximately 30% heart failure seems well compensated today.  Ventricular tachycardia recently started on mexiletine by Dr. Curt Bears.  Follow-up with him scheduled.  Persistent atrial fibrillation chadsvasc score  equals 5 on Eliquis    Medication Adjustments/Labs and Tests Ordered: Current medicines are reviewed at length with the patient today.  Concerns regarding medicines are outlined above.  Medication changes, Labs and Tests ordered today are listed in the Patient Instructions below. Patient Instructions  Medication Instructions:  Your physician recommends that you continue on your current medications as directed. Please refer to the Current Medication list given to you today.   Labwork: TODAY:  BMET & PRO BNP  Testing/Procedures: None ordered  Follow-Up: Your physician recommends that you schedule a follow-up appointment in: [place]    Any Other Special Instructions Will Be Listed Below (If Applicable).   If you need a refill on your cardiac medications before your next appointment, please call your pharmacy.      Sumner Boast, PA-C  05/06/2017 11:07 AM    [facility] [address], [place], [place]  [address] Phone: [phone]; Fax: [phone]

## 2017-05-08 ENCOUNTER — Other Ambulatory Visit: Payer: Self-pay

## 2017-05-08 ENCOUNTER — Encounter (HOSPITAL_COMMUNITY): Payer: Self-pay | Admitting: *Deleted

## 2017-05-08 ENCOUNTER — Emergency Department (HOSPITAL_COMMUNITY): Payer: Medicare HMO

## 2017-05-08 ENCOUNTER — Telehealth: Payer: Self-pay | Admitting: Cardiology

## 2017-05-08 ENCOUNTER — Encounter: Payer: Medicare HMO | Admitting: *Deleted

## 2017-05-08 ENCOUNTER — Observation Stay (HOSPITAL_COMMUNITY)
Admission: EM | Admit: 2017-05-08 | Discharge: 2017-05-10 | Disposition: A | Discharge status: Home / Self Care | Payer: Medicare HMO | Attending: Internal Medicine | Admitting: Internal Medicine

## 2017-05-08 ENCOUNTER — Ambulatory Visit (HOSPITAL_BASED_OUTPATIENT_CLINIC_OR_DEPARTMENT_OTHER)
Admission: RE | Admit: 2017-05-08 | Discharge: 2017-05-08 | Disposition: A | Discharge status: Home / Self Care | Payer: Medicare HMO | Source: Ambulatory Visit | Attending: Internal Medicine | Admitting: Internal Medicine

## 2017-05-08 VITALS — BP 128/70 | HR 72 | Wt 191.0 lb

## 2017-05-08 DIAGNOSIS — E1122 Type 2 diabetes mellitus with diabetic chronic kidney disease: Secondary | ICD-10-CM | POA: Diagnosis not present

## 2017-05-08 DIAGNOSIS — I5042 Chronic combined systolic (congestive) and diastolic (congestive) heart failure: Secondary | ICD-10-CM | POA: Diagnosis not present

## 2017-05-08 DIAGNOSIS — I481 Persistent atrial fibrillation: Secondary | ICD-10-CM

## 2017-05-08 DIAGNOSIS — I482 Chronic atrial fibrillation: Secondary | ICD-10-CM | POA: Insufficient documentation

## 2017-05-08 DIAGNOSIS — K219 Gastro-esophageal reflux disease without esophagitis: Secondary | ICD-10-CM | POA: Insufficient documentation

## 2017-05-08 DIAGNOSIS — K319 Disease of stomach and duodenum, unspecified: Secondary | ICD-10-CM | POA: Insufficient documentation

## 2017-05-08 DIAGNOSIS — Z8711 Personal history of peptic ulcer disease: Secondary | ICD-10-CM | POA: Diagnosis not present

## 2017-05-08 DIAGNOSIS — K449 Diaphragmatic hernia without obstruction or gangrene: Secondary | ICD-10-CM | POA: Diagnosis not present

## 2017-05-08 DIAGNOSIS — I700 Atherosclerosis of aorta: Secondary | ICD-10-CM | POA: Insufficient documentation

## 2017-05-08 DIAGNOSIS — J439 Emphysema, unspecified: Secondary | ICD-10-CM | POA: Diagnosis not present

## 2017-05-08 DIAGNOSIS — R0602 Shortness of breath: Secondary | ICD-10-CM | POA: Diagnosis not present

## 2017-05-08 DIAGNOSIS — K921 Melena: Principal | ICD-10-CM | POA: Insufficient documentation

## 2017-05-08 DIAGNOSIS — Z7901 Long term (current) use of anticoagulants: Secondary | ICD-10-CM | POA: Insufficient documentation

## 2017-05-08 DIAGNOSIS — N184 Chronic kidney disease, stage 4 (severe): Secondary | ICD-10-CM | POA: Diagnosis present

## 2017-05-08 DIAGNOSIS — R634 Abnormal weight loss: Secondary | ICD-10-CM

## 2017-05-08 DIAGNOSIS — I130 Hypertensive heart and chronic kidney disease with heart failure and stage 1 through stage 4 chronic kidney disease, or unspecified chronic kidney disease: Secondary | ICD-10-CM | POA: Insufficient documentation

## 2017-05-08 DIAGNOSIS — N183 Chronic kidney disease, stage 3 (moderate): Secondary | ICD-10-CM | POA: Diagnosis not present

## 2017-05-08 DIAGNOSIS — Z95810 Presence of automatic (implantable) cardiac defibrillator: Secondary | ICD-10-CM | POA: Diagnosis not present

## 2017-05-08 DIAGNOSIS — R05 Cough: Secondary | ICD-10-CM | POA: Diagnosis not present

## 2017-05-08 DIAGNOSIS — Z951 Presence of aortocoronary bypass graft: Secondary | ICD-10-CM | POA: Diagnosis not present

## 2017-05-08 DIAGNOSIS — R627 Adult failure to thrive: Secondary | ICD-10-CM | POA: Diagnosis not present

## 2017-05-08 DIAGNOSIS — R5383 Other fatigue: Secondary | ICD-10-CM | POA: Diagnosis not present

## 2017-05-08 DIAGNOSIS — I10 Essential (primary) hypertension: Secondary | ICD-10-CM | POA: Diagnosis present

## 2017-05-08 DIAGNOSIS — I5022 Chronic systolic (congestive) heart failure: Secondary | ICD-10-CM | POA: Diagnosis not present

## 2017-05-08 DIAGNOSIS — I4819 Other persistent atrial fibrillation: Secondary | ICD-10-CM

## 2017-05-08 DIAGNOSIS — K922 Gastrointestinal hemorrhage, unspecified: Secondary | ICD-10-CM | POA: Diagnosis present

## 2017-05-08 DIAGNOSIS — I2510 Atherosclerotic heart disease of native coronary artery without angina pectoris: Secondary | ICD-10-CM | POA: Diagnosis not present

## 2017-05-08 DIAGNOSIS — I052 Rheumatic mitral stenosis with insufficiency: Secondary | ICD-10-CM | POA: Insufficient documentation

## 2017-05-08 DIAGNOSIS — Z952 Presence of prosthetic heart valve: Secondary | ICD-10-CM | POA: Insufficient documentation

## 2017-05-08 DIAGNOSIS — Z79899 Other long term (current) drug therapy: Secondary | ICD-10-CM | POA: Diagnosis not present

## 2017-05-08 DIAGNOSIS — R079 Chest pain, unspecified: Secondary | ICD-10-CM | POA: Diagnosis not present

## 2017-05-08 DIAGNOSIS — E440 Moderate protein-calorie malnutrition: Secondary | ICD-10-CM

## 2017-05-08 DIAGNOSIS — E0590 Thyrotoxicosis, unspecified without thyrotoxic crisis or storm: Secondary | ICD-10-CM | POA: Diagnosis not present

## 2017-05-08 DIAGNOSIS — I472 Ventricular tachycardia: Secondary | ICD-10-CM | POA: Diagnosis not present

## 2017-05-08 DIAGNOSIS — E785 Hyperlipidemia, unspecified: Secondary | ICD-10-CM | POA: Diagnosis not present

## 2017-05-08 HISTORY — DX: Gastrointestinal hemorrhage, unspecified: K92.2

## 2017-05-08 LAB — COMPREHENSIVE METABOLIC PANEL
ALT: 38 U/L (ref 17–63)
AST: 34 U/L (ref 15–41)
Albumin: 3 g/dL — ABNORMAL LOW (ref 3.5–5.0)
Alkaline Phosphatase: 60 U/L (ref 38–126)
Anion gap: 11 (ref 5–15)
BUN: 28 mg/dL — ABNORMAL HIGH (ref 6–20)
CHLORIDE: 108 mmol/L (ref 101–111)
CO2: 21 mmol/L — ABNORMAL LOW (ref 22–32)
CREATININE: 1.55 mg/dL — AB (ref 0.61–1.24)
Calcium: 8.8 mg/dL — ABNORMAL LOW (ref 8.9–10.3)
GFR calc Af Amer: 46 mL/min — ABNORMAL LOW (ref >60)
GFR, EST NON AFRICAN AMERICAN: 40 mL/min — AB (ref >60)
Glucose, Bld: 131 mg/dL — ABNORMAL HIGH (ref 65–99)
Potassium: 4.1 mmol/L (ref 3.5–5.1)
Sodium: 140 mmol/L (ref 135–145)
Total Bilirubin: 0.9 mg/dL (ref 0.3–1.2)
Total Protein: 5.9 g/dL — ABNORMAL LOW (ref 6.5–8.1)

## 2017-05-08 LAB — URINALYSIS, ROUTINE W REFLEX MICROSCOPIC
BILIRUBIN URINE: NEGATIVE
Glucose, UA: NEGATIVE mg/dL
Hgb urine dipstick: NEGATIVE
Ketones, ur: NEGATIVE mg/dL
LEUKOCYTES UA: NEGATIVE
NITRITE: NEGATIVE
PH: 5 (ref 5.0–8.0)
Protein, ur: NEGATIVE mg/dL
Specific Gravity, Urine: 1.033 — ABNORMAL HIGH (ref 1.005–1.030)

## 2017-05-08 LAB — CBC WITH DIFFERENTIAL/PLATELET
BASOS ABS: 0 10*3/uL (ref 0.0–0.1)
Basophils Relative: 0 %
EOS PCT: 1 %
Eosinophils Absolute: 0.1 10*3/uL (ref 0.0–0.7)
HEMATOCRIT: 40.6 % (ref 39.0–52.0)
HEMOGLOBIN: 13.1 g/dL (ref 13.0–17.0)
LYMPHS PCT: 21 %
Lymphs Abs: 1.8 10*3/uL (ref 0.7–4.0)
MCH: 28.2 pg (ref 26.0–34.0)
MCHC: 32.3 g/dL (ref 30.0–36.0)
MCV: 87.5 fL (ref 78.0–100.0)
Monocytes Absolute: 1.4 10*3/uL — ABNORMAL HIGH (ref 0.1–1.0)
Monocytes Relative: 16 %
NEUTROS ABS: 5.3 10*3/uL (ref 1.7–7.7)
Neutrophils Relative %: 62 %
PLATELETS: 270 10*3/uL (ref 150–400)
RBC: 4.64 MIL/uL (ref 4.22–5.81)
RDW: 13.4 % (ref 11.5–15.5)
WBC: 8.5 10*3/uL (ref 4.0–10.5)

## 2017-05-08 LAB — TYPE AND SCREEN
ABO/RH(D): A NEG
ANTIBODY SCREEN: NEGATIVE

## 2017-05-08 LAB — CBC
HCT: 40.9 % (ref 39.0–52.0)
Hemoglobin: 13.2 g/dL (ref 13.0–17.0)
MCH: 28.3 pg (ref 26.0–34.0)
MCHC: 32.3 g/dL (ref 30.0–36.0)
MCV: 87.8 fL (ref 78.0–100.0)
PLATELETS: 258 10*3/uL (ref 150–400)
RBC: 4.66 MIL/uL (ref 4.22–5.81)
RDW: 13.4 % (ref 11.5–15.5)
WBC: 9.2 10*3/uL (ref 4.0–10.5)

## 2017-05-08 LAB — PROTIME-INR
INR: 1.52
Prothrombin Time: 18.2 seconds — ABNORMAL HIGH (ref 11.4–15.2)

## 2017-05-08 LAB — HEMOGLOBIN A1C
Hgb A1c MFr Bld: 6.3 % — ABNORMAL HIGH (ref 4.8–5.6)
Mean Plasma Glucose: 134.11 mg/dL

## 2017-05-08 LAB — I-STAT TROPONIN, ED: Troponin i, poc: 0.06 ng/mL (ref 0.00–0.08)

## 2017-05-08 LAB — POC OCCULT BLOOD, ED: FECAL OCCULT BLD: POSITIVE — AB

## 2017-05-08 LAB — I-STAT CG4 LACTIC ACID, ED: LACTIC ACID, VENOUS: 1.62 mmol/L (ref 0.5–1.9)

## 2017-05-08 LAB — MRSA PCR SCREENING: MRSA by PCR: NEGATIVE

## 2017-05-08 MED ORDER — HYDRALAZINE HCL 20 MG/ML IJ SOLN: 5.0000 mg | INTRAMUSCULAR | Status: DC | PRN

## 2017-05-08 MED ORDER — MORPHINE SULFATE (PF) 4 MG/ML IV SOLN: 1.0000 mg | INTRAVENOUS | Status: DC | PRN

## 2017-05-08 MED ORDER — PANTOPRAZOLE SODIUM 40 MG IV SOLR
40.0000 mg | Freq: Once | INTRAVENOUS | Status: AC
  Administered 2017-05-08: 40 mg via INTRAVENOUS
  Filled 2017-05-08: qty 40

## 2017-05-08 MED ORDER — SODIUM CHLORIDE 0.9 % IV SOLN
80.0000 mg | Freq: Two times a day (BID) | INTRAVENOUS | Status: DC
  Filled 2017-05-08: qty 80

## 2017-05-08 MED ORDER — HYDRALAZINE HCL 20 MG/ML IJ SOLN: 10.0000 mg | Freq: Three times a day (TID) | INTRAMUSCULAR | Status: DC | PRN

## 2017-05-08 MED ORDER — ACETAMINOPHEN 325 MG PO TABS: 650.0000 mg | ORAL_TABLET | Freq: Four times a day (QID) | ORAL | Status: DC | PRN

## 2017-05-08 MED ORDER — ONDANSETRON HCL 4 MG/2ML IJ SOLN: 4.0000 mg | Freq: Four times a day (QID) | INTRAMUSCULAR | Status: DC | PRN

## 2017-05-08 MED ORDER — NITROGLYCERIN 0.4 MG SL SUBL: 0.4000 mg | SUBLINGUAL_TABLET | SUBLINGUAL | Status: DC | PRN

## 2017-05-08 MED ORDER — PANTOPRAZOLE SODIUM 40 MG IV SOLR
40.0000 mg | Freq: Two times a day (BID) | INTRAVENOUS | Status: DC
  Administered 2017-05-08 – 2017-05-09 (×3): 40 mg via INTRAVENOUS
  Filled 2017-05-08 (×3): qty 40

## 2017-05-08 MED ORDER — BLISTEX MEDICATED EX OINT
TOPICAL_OINTMENT | CUTANEOUS | Status: DC | PRN
  Filled 2017-05-08: qty 6.3

## 2017-05-08 MED ORDER — METOPROLOL TARTRATE 5 MG/5ML IV SOLN
5.0000 mg | Freq: Two times a day (BID) | INTRAVENOUS | Status: DC
  Administered 2017-05-08 – 2017-05-09 (×3): 5 mg via INTRAVENOUS
  Filled 2017-05-08 (×3): qty 5

## 2017-05-08 MED ORDER — IOPAMIDOL (ISOVUE-370) INJECTION 76%
INTRAVENOUS | Status: AC
  Administered 2017-05-08: 75 mL
  Filled 2017-05-08: qty 100

## 2017-05-08 MED ORDER — LIP MEDEX EX OINT
TOPICAL_OINTMENT | CUTANEOUS | Status: DC | PRN
  Filled 2017-05-08 (×2): qty 7

## 2017-05-08 MED ORDER — ACETAMINOPHEN 650 MG RE SUPP: 650.0000 mg | Freq: Four times a day (QID) | RECTAL | Status: DC | PRN

## 2017-05-08 MED ORDER — ONDANSETRON HCL 4 MG PO TABS: 4.0000 mg | ORAL_TABLET | Freq: Four times a day (QID) | ORAL | Status: DC | PRN

## 2017-05-08 MED ORDER — BOOST / RESOURCE BREEZE PO LIQD CUSTOM
1.0000 | Freq: Three times a day (TID) | ORAL | Status: DC
  Administered 2017-05-08 – 2017-05-10 (×3): 1 via ORAL

## 2017-05-08 NOTE — Plan of Care (Signed)
Pt oriented to unit, plan of care, and method of reporting concerns. Verbalizes understanding of need to call for assistance prior to ambulation. Call light and bedside table within reach

## 2017-05-08 NOTE — Telephone Encounter (Signed)
Confirmed remote transmission w/ pt wife.

## 2017-05-08 NOTE — ED Triage Notes (Signed)
Patient was brought form the [facility], c/o feeling very weak , abd. Pain dark stools and sob .

## 2017-05-08 NOTE — ED Notes (Signed)
PT did not have to use RR at this time  

## 2017-05-08 NOTE — ED Provider Notes (Signed)
[facility] EMERGENCY DEPARTMENT Provider Note   CSN: [id number] Arrival date & time: 05/08/17  1046     History   Chief Complaint Chief Complaint  Patient presents with  . Melena    HPI [name] is a 84 y.o. male.  HPI   84 year old male with 84 y.o. male with history of hypertension, status post CABG and pericardial tissue AVR 2009, dilated cardiomyopathy ejection fraction 25-30% chronic combined systolic and diastolic CHF in the setting of atrial fib with RVR, orthostatic hypotension, pacemaker.  Patient's been seen in the cardiac clinic several times in the last several weeks with continued weight loss.  Patient had almost 20 pound weight loss in the last several weeks.  He reports fatigue.  Patient had chronic shortness of breath for the last several weeks as well.  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  Patient was taken off the amiodarone in November.  But is continued to have shortness of breath.   He also noticed black stools last several days.  He mentions Dr. Haroldine Laws to who brought him here to the ED for evaluation.  Of note patient's been taking Pepto-Bismol two times in the last week.  Reports diarrhea.  No vomiting.  Mild epigastric pain last night.      Past Medical History:  Diagnosis Date  . AICD (automatic cardioverter/defibrillator) present   . Aortic valve disorder 2009   severe AS s/p AVR with pericardial tissue valve  . Arthritis    "minor everywhere" (04/22/2015)  . Asthma    "a touch"  . Barrett esophagus   . CAD (coronary artery disease)    s/p 2 vessel CABG with LIMA to LAD, SVG to diagonal 1, SVG to RCA 12/09  . Cardiomyopathy ([place])    EF 15-20% by echo 2017  . Chronic combined systolic and diastolic CHF (congestive heart failure) (HCC)    dry weight 197-200lbs.  . CKD (chronic kidney disease)   . Diabetes ([place])    "I'm prediabetic" (04/22/2015)  . GERD  (gastroesophageal reflux disease)   . H/O: rheumatic fever   . Hepatitis 1957   "don't know what kind:  . History of hiatal hernia   . History of PFTs    a. Amiodarone started 5/16 >> PFTs w/ DLCO 6/16:  FEV1 72% predicted, FEV1/FVC 66%, DLCO 66% >> minimal reversible obstructive airways disease with mild diffusion defect (suggestive of emphysema but absence of hyperinflation inconsistent with dx)  . HTN (hypertension)   . Hypercholesteremia   . Orthostatic hypotension   . Persistent atrial fibrillation ([place])    post op afib with no reoccurence  . Pneumonia 08/2014   "dr thought I may have had a touch"    Patient Active Problem List   Diagnosis Date Noted  . Ventricular tachycardia ([place]) 05/06/2017  . Dizziness 04/23/2017  . Weight loss 04/23/2017  . Leg weakness 01/28/2017  . Abnormal nuclear stress test: HIGH RISK 11/04/2014  . Cardiomyopathy, ischemic: EF roughly 30% 11/04/2014  . S/P AVR (aortic valve replacement) 09/02/2014  . Chronic combined systolic and diastolic heart failure ([place])   . Shingles 08/23/2014  . CKD (chronic kidney disease) stage 3, GFR 30-59 ml/min (HCC) 08/23/2014  . Hyperkalemia 08/23/2014  . Aortic stenosis, severe 01/23/2013  . Essential hypertension 01/23/2013  . Dyslipidemia 01/23/2013  . CAD status post CABG 01/23/2013  . Diastolic dysfunction 38/46/6599  . Persistent atrial fibrillation ([place]) 01/23/2013  . Chronic anticoagulation 01/23/2013  Past Surgical History:  Procedure Laterality Date  . AORTIC VALVE REPLACEMENT  with 23-mm Magna Ease pericardial valve, model number   with 23-mm Magna Ease pericardial valve, model [id number], serial number [id number]   . APPENDECTOMY  1940s  . BI-VENTRICULAR IMPLANTABLE CARDIOVERTER DEFIBRILLATOR  (CRT-D)  04/22/2015  . CARDIAC CATHETERIZATION N/A 11/04/2014   Procedure: Left Heart Cath and Cors/Grafts Angiography;  Surgeon: Leonie Man, MD;  Location: [facility];  Service: Cardiovascular;   Laterality: N/A;  . CARDIAC VALVE REPLACEMENT    . CARDIOVERSION N/A 01/27/2013   Procedure: CARDIOVERSION;  Surgeon: Sueanne Margarita, MD;  Location: [facility];  Service: Cardiovascular;  Laterality: N/A;  . CARDIOVERSION N/A 08/25/2014   Procedure: CARDIOVERSION;  Surgeon: Sanda Klein, MD;  Location: [facility];  Service: Cardiovascular;  Laterality: N/A;  . CATARACT EXTRACTION W/ INTRAOCULAR LENS  IMPLANT, BILATERAL Bilateral   . CORONARY ARTERY BYPASS GRAFT  03/10/2008   x 3 Dr. Roxan Hockey  . EP IMPLANTABLE DEVICE N/A 04/22/2015   Procedure: BiV ICD Insertion CRT-D;  Surgeon: [name], MD;  Location: [facility];  Service: Cardiovascular;  Laterality: N/A;  . ESOPHAGOGASTRODUODENOSCOPY (EGD) WITH ESOPHAGEAL DILATION  X1  . TONSILLECTOMY         Home Medications    Prior to Admission medications   Medication Sig Start Date End Date Taking? Authorizing Provider  acetaminophen (TYLENOL) 500 MG tablet Take 500 mg by mouth every 6 (six) hours as needed for moderate pain.    [provider]  atorvastatin (LIPITOR) 20 MG tablet TAKE 1 TABLET (20 MG TOTAL) DAILY AT 6 PM. 09/27/16   Turner, Eber Hong, MD  carvedilol (COREG) 6.25 MG tablet Take 6.25 mg by mouth 2 (two) times daily with a meal.    [provider]  ELIQUIS 2.5 MG TABS tablet TAKE 1 TABLET TWICE DAILY 11/02/16   Sueanne Margarita, MD  furosemide (LASIX) 20 MG tablet TAKE 1 TABLET (20 MG TOTAL) BY MOUTH DAILY. 09/27/16   Sueanne Margarita, MD  hydrALAZINE (APRESOLINE) 25 MG tablet Take 0.5 tablets (12.5 mg total) by mouth 2 (two) times daily before a meal. 03/14/17   Turner, Eber Hong, MD  isosorbide mononitrate (IMDUR) 30 MG 24 hr tablet Take 0.5 tablets (15 mg total) by mouth at bedtime. 04/23/17   Imogene Burn, PA-C  mexiletine (MEXITIL) 250 MG capsule Take 1 capsule (250 mg total) by mouth 2 (two) times daily. 04/18/17   Camnitz, Ocie Doyne, MD  Multiple Vitamin (MULTIVITAMIN) capsule Take 1  capsule by mouth daily.    [provider]  omega-3 acid ethyl esters (LOVAZA) 1 G capsule Take 2 g by mouth 2 (two) times daily.    [provider]  ranitidine (ZANTAC) 300 MG tablet Take 300 mg by mouth at bedtime.    [provider]  traMADol (ULTRAM) 50 MG tablet Take 1 tablet by mouth as directed. 11/28/15   [provider]    Family History Family History  Problem Relation Age of Onset  . Alzheimer's disease Mother   . COPD Daughter   . Alzheimer's disease Unknown   . COPD Sister   . Depression Sister   . Heart disease Sister   . Hyperlipidemia Sister   . Hypertension Sister   . Esophageal cancer Sister        + smoker    Social History Social History   Tobacco Use  . Smoking status: Never Smoker  . Smokeless  tobacco: Never Used  Substance Use Topics  . Alcohol use: Yes    Comment: 04/22/2015 "nothing since the 1980s; never had a problem w/it"  . Drug use: No     Allergies   Novocain [procaine]   Review of Systems Review of Systems  Constitutional: Positive for fatigue. Negative for activity change and fever.  HENT: Negative for congestion.   Respiratory: Positive for cough and shortness of breath.   Cardiovascular: Positive for chest pain.  Gastrointestinal: Positive for diarrhea. Negative for abdominal pain and vomiting.  All other systems reviewed and are negative.    Physical Exam Updated Vital Signs BP (!) 151/75 (BP Location: Right Arm)   Pulse 67   Temp 97.8 F (36.6 C) (Oral)   Resp 18   Ht 6' (1.829 m)   Wt 86.6 kg (191 lb)   SpO2 95%   BMI 25.90 kg/m   Physical Exam  Constitutional: He appears well-developed and well-nourished.  HENT:  Head: Normocephalic and atraumatic.  Scarring from old Mohs procedures on face.  Eyes: Conjunctivae are normal.  Neck: Neck supple.  Cardiovascular: Normal rate and regular rhythm.  No murmur heard. Pulmonary/Chest: Effort normal and breath sounds normal. No  respiratory distress.  Abdominal: Soft. There is no tenderness.  Genitourinary:  Genitourinary Comments: Rectum has hemorrhoids.  Musculoskeletal: He exhibits no edema.  Neurological: He is alert.  Skin: Skin is warm and dry.  Psychiatric: He has a normal mood and affect.  Nursing note and vitals reviewed.    ED Treatments / Results  Labs (all labs ordered are listed, but only abnormal results are displayed) Labs Reviewed  CBC WITH DIFFERENTIAL/PLATELET - Abnormal; Notable for the following components:      Result Value   Monocytes Absolute 1.4 (*)    All other components within normal limits  COMPREHENSIVE METABOLIC PANEL - Abnormal; Notable for the following components:   CO2 21 (*)    Glucose, Bld 131 (*)    BUN 28 (*)    Creatinine, Ser 1.55 (*)    Calcium 8.8 (*)    Total Protein 5.9 (*)    Albumin 3.0 (*)    GFR calc non Af Amer 40 (*)    GFR calc Af Amer 46 (*)    All other components within normal limits  PROTIME-INR - Abnormal; Notable for the following components:   Prothrombin Time 18.2 (*)    All other components within normal limits  POC OCCULT BLOOD, ED - Abnormal; Notable for the following components:   Fecal Occult Bld POSITIVE (*)    All other components within normal limits  URINALYSIS, ROUTINE W REFLEX MICROSCOPIC  OCCULT BLOOD X 1 CARD TO LAB, STOOL  I-STAT CG4 LACTIC ACID, ED  I-STAT TROPONIN, ED  TYPE AND SCREEN    EKG  EKG Interpretation  Date/Time:  Wednesday May 08 2017 11:04:41 EST Ventricular Rate:  74 PR Interval:    QRS Duration: 161 QT Interval:  497 QTC Calculation: 552 R Axis:   -83 Text Interpretation:  Sinus rhythm Nonspecific IVCD with LAD LVH with secondary repolarization abnormality Anterolateral infarct, age indeterminate No significant change since last tracing Confirmed by Zenovia Jarred [phone]) on 05/08/2017 11:57:19 AM       Radiology Dg Chest 2 View  Result Date: 05/08/2017 CLINICAL DATA:  Generalized  weakness. Shortness of breath on exertion. Decreased P.O. intake for 3 weeks. EXAM: CHEST  2 VIEW COMPARISON:  CT chest 03/05/2017.  PA and lateral chest 02/25/2017. FINDINGS: There  is cardiomegaly without edema. The patient is status post CABG with a pacing device in place. Small bilateral pleural effusions and basilar airspace disease are greater on the left and increased slightly since the prior plain films. No pneumothorax. IMPRESSION: Small right greater than left pleural effusions and basilar airspace disease have increased since the prior plain films. Cardiomegaly without edema. Electronically Signed   By: Inge Rise M.D.   On: 05/08/2017 11:51   Ct Angio Chest Pe W And/or Wo Contrast  Result Date: 05/08/2017 CLINICAL DATA:  Shortness of breath and generalized weakness. EXAM: CT ANGIOGRAPHY CHEST WITH CONTRAST TECHNIQUE: Multidetector CT imaging of the chest was performed using the standard protocol during bolus administration of intravenous contrast. Multiplanar CT image reconstructions and MIPs were obtained to evaluate the vascular anatomy. CONTRAST:  30m ISOVUE-370 IOPAMIDOL (ISOVUE-370) INJECTION 76% COMPARISON:  03/05/2017. FINDINGS: Cardiovascular: Negative for pulmonary embolus. Atherosclerotic calcification of the arterial vasculature. Pulmonary arteries and heart are enlarged. No pericardial effusion. Mediastinum/Nodes: Mediastinal and right hilar lymph nodes are not enlarged by CT size criteria. No axillary adenopathy. Esophagus is grossly unremarkable. Lungs/Pleura: Image quality is degraded by respiratory motion. Moderate right pleural effusion with compressive atelectasis in the right lower lobe. Small left pleural effusion with minimal compressive atelectasis in the left lower lobe. 6 mm anterior left upper lobe nodule (series 6, image 69), appears new. Additional scattered pulmonary nodules measure 4 mm or less in size and are stable. Airway is unremarkable. Upper Abdomen:  Visualized portions of the liver, gallbladder, adrenal glands, kidneys, spleen, pancreas, stomach and bowel are grossly unremarkable. No upper abdominal adenopathy. Musculoskeletal: Degenerative changes in the spine. No worrisome lytic or sclerotic lesions. Review of the MIP images confirms the above findings. IMPRESSION: 1. Bilateral pleural effusions and associated compressive atelectasis, right greater than left. 2. 6 mm subpleural left upper lobe nodule appears new from 03/05/2017. Consider short-term follow-up in 3 months with CT chest without contrast, as clinically indicated. 3. Additional scattered pulmonary nodules are stable. 4.  Aortic atherosclerosis (ICD10-170.0). 5.  Emphysema (ICD10-J43.9). 6. Enlarged pulmonary arteries, indicative of pulmonary arterial hypertension. Electronically Signed   By: MLorin PicketM.D.   On: 05/08/2017 13:15    Procedures Procedures (including critical care time)  Medications Ordered in ED Medications  pantoprazole (PROTONIX) injection 40 mg (not administered)  iopamidol (ISOVUE-370) 76 % injection (75 mLs  Contrast Given 05/08/17 1235)     Initial Impression / Assessment and Plan / ED Course  I have reviewed the triage vital signs and the nursing notes.  Pertinent labs & imaging results that were available during my care of the patient were reviewed by me and considered in my medical decision making (see chart for details).    84year old male with 84y.o. male with history of hypertension, status post CABG and pericardial tissue AVR 2009, dilated cardiomyopathy ejection fraction 25-30% chronic combined systolic and diastolic CHF in the setting of atrial fib with RVR, orthostatic hypotension, pacemaker.  Patient's been seen in the cardiac clinic several times in the last several weeks with continued weight loss.  Patient had almost 20 pound weight loss in the last several weeks.  He reports fatigue.  Patient had chronic shortness of breath for the last  several weeks as well.  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  Patient was taken off the amiodarone in November.  But is continued to have shortness of breath.   She also noticed black stools last  several days.  He mentions Dr. Haroldine Laws to who brought him here to the ED for evaluation.  Of note patient's been taking Pepto-Bismol at home for the last several days which correlates with his black stools.  Reports diarrhea.  No vomiting.  Mild epigastric pain last night.  11:17 AM Patient is very nonspecific complaints of weight loss and fatigue.  Concerned about the black stools.  Will do Hemoccult.  Get labs.  1:41 PM Heme +. Will admit given ongoing black stools, weakness, lack of appetitie. Will treat for GI bleed.  Admit for continued eval.   Final Clinical Impressions(s) / ED Diagnoses   Final diagnoses:  None    ED Discharge Orders    None       Daphna Lafuente, Fredia Sorrow, MD 05/08/17 1342

## 2017-05-08 NOTE — Progress Notes (Signed)
ADVANCED HF CLINIC CONSULT NOTE  Referring Physician: Turner Primary Care: [name], MD Primary Cardiologist: Radford Pax  HPI:  [name] is a 84 y.o. male with history of hypertension, CAD status post CABG and pericardial tissue AVR 2542, chronic systolic HF ejection fraction 25-30% s/p CRT-D, PAF (on Eliquis)  and orthostatic hypotension referred by Dr. Radford Pax for CHF evaluation.   Saw Dr. Curt Bears 04/18/17 and device interrogation showed multiple episodes of ventricular tachycardia below his detection limit. Was taken off amiodarone due to shortness of breath (had been on for several years). Had CT scan in 11/18 which showed COPD and multiple new pulmonary nodules - unclear if related to amio or possibly malignancy. Repeat CT scheduled for 3/19  Dr. Curt Bears lowered his monitor zone and detection zone and started him on mexiletine. Patient had 30 seconds of wide complex tachycardia rate of 117 bpm 04/30/17 but had just started his mexiletine because of insurance reasons so Dr. Curt Bears wanted to continue to monitor this.  He has been following with Estella Husk PA closely in the [facility] office. Last seen 2 days ago. He had lost 18 pounds in 2 weeks. Felt to be weak but stable. NT-pro BNP checked and went from 10,800 -> 22,400 so Dr. Radford Pax requested a HF consult. Creatinine stable at 1.5.  He is here with his daughter. Says he continues to lose 2 pounds per day. Very weak Can not stand by himself. Unable to eat. Starting last night has had 3 bouts of melena and epigastric pain. Denies CP. Gets SOB with any activity but ok at rest. No orthopnea, edema or PND.   Review of Systems: [y] = yes, _0  = no    General: Weight gain _1 ; Weight loss [ y]; Anorexia _2 ; Fatigue [y]; Fever _3 ; Chills _4 ; Weakness Blue.Reese ]   Cardiac: Chest pain/pressure _5 ; Resting SOB _6 ; Exertional SOB [y]; Orthopnea _7 ; Pedal Edema _8 ; Palpitations _9 ; Syncope _10 ; Presyncope _11 ; Paroxysmal  nocturnal dyspnea_12    Pulmonary: Cough _13 ; Wheezing_14 ; Hemoptysis_15 ; Sputum _16 ; Snoring _17    GI: Vomiting_18 ; Dysphagia_19 ; Melena[y ]; Hematochezia _20 ; Heartburn_21 ; Abdominal pain [ y]; Constipation _22 ; Diarrhea _23 ; BRBPR _24    GU: Hematuria_25 ; Dysuria _26 ; Nocturia_27   Vascular: Pain in legs with walking _28 ; Pain in feet with lying flat _29 ; Non-healing sores _30 ; Stroke _31 ; TIA _32 ; Slurred speech _33 ;   Neuro: Headaches_34 ; Vertigo_35 ; Seizures_36 ; Paresthesias_37 ;Blurred vision _38 ; Diplopia _39 ; Vision changes _40    Ortho/Skin: Arthritis [y]; Joint pain [y]; Muscle pain _41 ; Joint swelling _42 ; Back Pain _43 ; Rash _44    Psych: Depression_45 ; Anxiety_46    Heme: Bleeding problems Blue.Reese ]; Clotting disorders _47 ; Anemia _48    Endocrine: Diabetes _49 ; Thyroid dysfunction_50    Past Medical History:  Diagnosis Date  . AICD (automatic cardioverter/defibrillator) present   . Aortic valve disorder 2009   severe AS s/p AVR with pericardial tissue valve  . Arthritis    "minor everywhere" (04/22/2015)  . Asthma    "a touch"  . Barrett esophagus   . CAD (coronary artery disease)    s/p 2 vessel CABG with LIMA to LAD, SVG to diagonal 1, SVG to RCA 12/09  . Cardiomyopathy ([place])    EF 15-20% by echo 2017  .  Chronic combined systolic and diastolic CHF (congestive heart failure) (HCC)    dry weight 197-200lbs.  . CKD (chronic kidney disease)   . Diabetes ([place])    "I'm prediabetic" (04/22/2015)  . GERD (gastroesophageal reflux disease)   . H/O: rheumatic fever   . Hepatitis 1957   "don't know what kind:  . History of hiatal hernia   . History of PFTs    a. Amiodarone started 5/16 >> PFTs w/ DLCO 6/16:  FEV1 72% predicted, FEV1/FVC 66%, DLCO 66% >> minimal reversible obstructive airways disease with mild diffusion defect (suggestive of emphysema but absence of hyperinflation inconsistent with dx)  . HTN (hypertension)   . Hypercholesteremia   . Orthostatic  hypotension   . Persistent atrial fibrillation ([place])    post op afib with no reoccurence  . Pneumonia 08/2014   "dr thought I may have had a touch"    Current Outpatient Medications  Medication Sig Dispense Refill  . acetaminophen (TYLENOL) 500 MG tablet Take 500 mg by mouth every 6 (six) hours as needed for moderate pain.    Marland Kitchen atorvastatin (LIPITOR) 20 MG tablet TAKE 1 TABLET (20 MG TOTAL) DAILY AT 6 PM. 90 tablet 2  . carvedilol (COREG) 6.25 MG tablet Take 6.25 mg by mouth 2 (two) times daily with a meal.    . ELIQUIS 2.5 MG TABS tablet TAKE 1 TABLET TWICE DAILY 180 tablet 2  . furosemide (LASIX) 20 MG tablet TAKE 1 TABLET (20 MG TOTAL) BY MOUTH DAILY. 90 tablet 2  . hydrALAZINE (APRESOLINE) 25 MG tablet Take 0.5 tablets (12.5 mg total) by mouth 2 (two) times daily before a meal. 90 tablet 3  . isosorbide mononitrate (IMDUR) 30 MG 24 hr tablet Take 0.5 tablets (15 mg total) by mouth at bedtime. 90 tablet 2  . mexiletine (MEXITIL) 250 MG capsule Take 1 capsule (250 mg total) by mouth 2 (two) times daily. 60 capsule 3  . Multiple Vitamin (MULTIVITAMIN) capsule Take 1 capsule by mouth daily.    Marland Kitchen omega-3 acid ethyl esters (LOVAZA) 1 G capsule Take 2 g by mouth 2 (two) times daily.    . ranitidine (ZANTAC) 300 MG tablet Take 300 mg by mouth at bedtime.    . traMADol (ULTRAM) 50 MG tablet Take 1 tablet by mouth as directed.     No current facility-administered medications for this encounter.     Allergies  Allergen Reactions  . Novocain [Procaine] Swelling      Social History   Socioeconomic History  . Marital status: Married    Spouse name: Not on file  . Number of children: Not on file  . Years of education: Not on file  . Highest education level: Not on file  Social Needs  . Financial resource strain: Not on file  . Food insecurity - worry: Not on file  . Food insecurity - inability: Not on file  . Transportation needs - medical: Not on file  . Transportation needs -  non-medical: Not on file  Occupational History  . Not on file  Tobacco Use  . Smoking status: Never Smoker  . Smokeless tobacco: Never Used  Substance and Sexual Activity  . Alcohol use: Yes    Comment: 04/22/2015 "nothing since the 1980s; never had a problem w/it"  . Drug use: No  . Sexual activity: No  Other Topics Concern  . Not on file  Social History Narrative   Married, lives with spouse [name]   4 children, 2 boys and  2 girls > one daughter passed   OCCUPATION: retired, Pension scheme manager for [employer]      Family History  Problem Relation Age of Onset  . Alzheimer's disease Mother   . COPD Daughter   . Alzheimer's disease Unknown   . COPD Sister   . Depression Sister   . Heart disease Sister   . Hyperlipidemia Sister   . Hypertension Sister   . Esophageal cancer Sister        + smoker    Vitals:   05/08/17 1000  BP: 128/70  Pulse: 72  SpO2: 95%  Weight: 191 lb (86.6 kg)    PHYSICAL EXAM: General:  Elderly male in [place]. Weak appearing. No respiratory difficulty HEENT: normal Neck: supple. no JVD. Carotids 2+ bilat; no bruits. No lymphadenopathy or thryomegaly appreciated. Cor: PMI laterally displaced. irregular rate & rhythm. No rubs, gallops or murmurs. Lungs: clear with decreased BS throughout  Abdomen: soft, mildly tender in epigastrum, nondistended. No hepatosplenomegaly. No bruits or masses. Good bowel sounds. Extremities: nail beds pale, clubbing, rash, edema Neuro: alert & oriented x 3, cranial nerves grossly intact. moves all 4 extremities w/o difficulty. Affect pleasant.   ASSESSMENT & PLAN:  1. Failure to thrive with melena - he has had progressive weight loss (> 20 pounds in 3 weeks) and now with melena. Very weak currently. - I have transported him personally to ER for further work up of bleeding and possible malignancy. D/w Dr. Venora Maples.   2. Chronic systolic HF due to ICM s/p CRT-D - echo 5/18 EF 25% - volume status looks ok. Dry  if anything. Do not think rising NT-pro-BNP reflects his actual volume state but may represent end-stage HF - NYHA IV but doubt that this is all due to HF.  - on carvedilol 6.25 bid - on hydralazine - no ACE/ARB/ARNI due to CKD  - no change for now. Will likely need b-blocker cut back   3. CAD s/p CABG/AVR 2009 - no s/s ischemia currently  4. Recent VT - seen by Dr. Curt Bears and switched amio to mexilitene due to SOB  5. Lung nodules - will need f/u imaging. Clinical course concerning.   6. Chronic AF - rate controlled. On Eliquis -> will need to hold with melena  We will be happy to see back in HF Clinic once acute medical issues stabilized.  Glori Bickers, MD  10:56 AM

## 2017-05-08 NOTE — ED Notes (Signed)
Pt's son Lanny Hurst) left number - [phone] Daughter [name]) [phone]

## 2017-05-08 NOTE — H&P (Signed)
History and Physical    [name] VJK:[id number] DOB: Nov 01, 1932 DOA: 05/08/2017  PCP: [name], MD  Patient coming from: home  Chief Complaint: profound weakness  HPI: [name] is a 84 y.o. male with medical history significant of CAD with h/o CABG, CHF, severe AoS - s/p replacement with tissue valve, s/p AICD for VT (recently amiodarone was discontinued due to SOB and started on mexiletine), CM with EF 25-30%, AF - on Eliquis, orthostatic, hypotension, COPD, DM type II, HLD  who presented to the ED with complains profound weakness and inability to perform ADLs without assistance, poor appetite with early satiety, nausea and weight loss that has been on on for almost 2 weeks. Patient reported weight loss of nearly 20 pounds within that time and since yesterday he had 3 bouts of melena. Patient was seen today by Dr. Haroldine Laws who did not feel like his symptoms are related to CHF exacerbation and referred the patient to the emergency department for further evaluation   ED Course: upon arrival to the emergency department patient demonstrated stable vital signs, blood work demonstrated B and 28 and creatinine 1.55 with GFR 40, troponin 0.06, lactic acid 1.62.  Hemoglobin was 13.1 today and on 01/28/2017 hemoglobin was 15.0 His stool was Hemoccult positive. Urinalysis unremarkable CT angiogram demonstrated bilateral pleural effusions with associated compressive atelectasis, right greater than left, 6 mm subpleural left upper lobe nodule which appears to be new and scattered pulmonary nodules that are stable and were seen on CT previously, emphysema. Patient was placed on IV Protonix  Review of Systems: As per HPI otherwise all other systems reviewed and  are negative  Ambulatory Status: able to ambulate independently prior to onset of symptoms  Past Medical History:  Diagnosis Date  . AICD (automatic cardioverter/defibrillator) present   . Aortic valve disorder 2009   severe AS s/p AVR with pericardial tissue valve  . Arthritis    "minor everywhere" (04/22/2015)  . Asthma    "a touch"  . Barrett esophagus   . CAD (coronary artery disease)    s/p 2 vessel CABG with LIMA to LAD, SVG to diagonal 1, SVG to RCA 12/09  . Cardiomyopathy ([place])    EF 15-20% by echo 2017  . Chronic combined systolic and diastolic CHF (congestive heart failure) (HCC)    dry weight 197-200lbs.  . CKD (chronic kidney disease)   . Diabetes ([place])    "I'm prediabetic" (04/22/2015)  . GERD (gastroesophageal reflux disease)   . H/O: rheumatic fever   . Hepatitis 1957   "don't know what kind:  . History of hiatal hernia   . History of PFTs    a. Amiodarone started 5/16 >> PFTs w/ DLCO 6/16:  FEV1 72% predicted, FEV1/FVC 66%, DLCO 66% >> minimal reversible obstructive airways disease with mild diffusion defect (suggestive of emphysema but absence of hyperinflation inconsistent with dx)  . HTN (hypertension)   . Hypercholesteremia   . Orthostatic hypotension   . Persistent atrial fibrillation ([place])    post op afib with no reoccurence  . Pneumonia 08/2014   "dr thought I may have had a touch"    Past Surgical History:  Procedure Laterality Date  . AORTIC VALVE REPLACEMENT  with 23-mm Magna Ease pericardial valve, model number   with 23-mm Magna Ease pericardial valve, model [id number], serial number [id number]   . APPENDECTOMY  1940s  . BI-VENTRICULAR IMPLANTABLE CARDIOVERTER DEFIBRILLATOR  (CRT-D)  04/22/2015  . CARDIAC CATHETERIZATION N/A 11/04/2014   Procedure:  Left Heart Cath and Cors/Grafts Angiography;  Surgeon: Leonie Man, MD;  Location: [facility];  Service: Cardiovascular;  Laterality: N/A;  . CARDIAC VALVE REPLACEMENT    . CARDIOVERSION N/A 01/27/2013   Procedure: CARDIOVERSION;  Surgeon: Sueanne Margarita, MD;  Location: [facility];  Service: Cardiovascular;  Laterality: N/A;  . CARDIOVERSION N/A 08/25/2014   Procedure: CARDIOVERSION;  Surgeon: Sanda Klein, MD;  Location: [facility];  Service: Cardiovascular;  Laterality: N/A;  . CATARACT EXTRACTION W/ INTRAOCULAR LENS  IMPLANT, BILATERAL Bilateral   . CORONARY ARTERY BYPASS GRAFT  03/10/2008   x 3 Dr. Roxan Hockey  . EP IMPLANTABLE DEVICE N/A 04/22/2015   Procedure: BiV ICD Insertion CRT-D;  Surgeon: [name], MD;  Location: [facility];  Service: Cardiovascular;  Laterality: N/A;  . ESOPHAGOGASTRODUODENOSCOPY (EGD) WITH ESOPHAGEAL DILATION  X1  . TONSILLECTOMY      Social History   Socioeconomic History  . Marital status: Married    Spouse name: Not on file  . Number of children: Not on file  . Years of education: Not on file  . Highest education level: Not on file  Social Needs  . Financial resource strain: Not on file  . Food insecurity - worry: Not on file  . Food insecurity - inability: Not on file  . Transportation needs - medical: Not on file  . Transportation needs - non-medical: Not on file  Occupational History  . Not on file  Tobacco Use  . Smoking status: Never Smoker  . Smokeless tobacco: Never Used  Substance and Sexual Activity  . Alcohol use: Yes    Comment: 04/22/2015 "nothing since the 1980s; never had a problem w/it"  . Drug use: No  . Sexual activity: No  Other Topics Concern  . Not on file  Social History Narrative   Married, lives with spouse [name]   4 children, 2 boys and 2 girls > one daughter passed   OCCUPATION: retired, Pension scheme manager for [employer]  . Novocain [Procaine] Swelling    Family History  Problem Relation Age of Onset  . Alzheimer's disease Mother   . COPD Daughter   . Alzheimer's disease Unknown   . COPD Sister   . Depression Sister   . Heart disease Sister   . Hyperlipidemia Sister   . Hypertension Sister   . Esophageal cancer Sister        + smoker    Prior to Admission medications   Medication Sig Start Date End Date Taking? Authorizing Provider   acetaminophen (TYLENOL) 500 MG tablet Take 500 mg by mouth every 6 (six) hours as needed for moderate pain.    [provider]  atorvastatin (LIPITOR) 20 MG tablet TAKE 1 TABLET (20 MG TOTAL) DAILY AT 6 PM. 09/27/16   Turner, Eber Hong, MD  carvedilol (COREG) 6.25 MG tablet Take 6.25 mg by mouth 2 (two) times daily with a meal.    [provider]  ELIQUIS 2.5 MG TABS tablet TAKE 1 TABLET TWICE DAILY 11/02/16   Sueanne Margarita, MD  furosemide (LASIX) 20 MG tablet TAKE 1 TABLET (20 MG TOTAL) BY MOUTH DAILY. 09/27/16   Sueanne Margarita, MD  hydrALAZINE (APRESOLINE) 25 MG tablet Take 0.5 tablets (12.5 mg total) by mouth 2 (two) times daily before a meal. 03/14/17   Turner, Eber Hong, MD  isosorbide mononitrate (IMDUR) 30 MG 24 hr tablet Take 0.5 tablets (15 mg total)  by mouth at bedtime. 04/23/17   Imogene Burn, PA-C  mexiletine (MEXITIL) 250 MG capsule Take 1 capsule (250 mg total) by mouth 2 (two) times daily. 04/18/17   Camnitz, Ocie Doyne, MD  Multiple Vitamin (MULTIVITAMIN) capsule Take 1 capsule by mouth daily.    [provider]  omega-3 acid ethyl esters (LOVAZA) 1 G capsule Take 2 g by mouth 2 (two) times daily.    [provider]  ranitidine (ZANTAC) 300 MG tablet Take 300 mg by mouth at bedtime.    [provider]  traMADol (ULTRAM) 50 MG tablet Take 1 tablet by mouth as directed. 11/28/15   [provider]    Physical Exam: Vitals:   05/08/17 1050 05/08/17 1103 05/08/17 1159 05/08/17 1353  BP:  (!) 151/75 (!) 151/75 (!) 170/87  Pulse:  78 67 78  Resp:  (!) _0 Temp:      TempSrc:      SpO2:  96% 95% 93%  Weight: 86.6 kg (191 lb)     Height: 6' (1.829 m)        General: Appears calm and comfortable Eyes: PERRLA, EOMI, normal lids, iris ENT:  grossly normal hearing, lips & tongue, mucous membranes moist and intact Neck: no lymphoadenopathy, masses or thyromegaly Cardiovascular: RRR, no m/r/g. No JVD, carotid bruits. No LE  edema.  Respiratory: bilateral no wheezes, rales, rhonchi, diminished breath sounds at the bases bilaterally. Normal respiratory effort. No accessory muscle use observed Abdomen: soft, tender in the epigastrium, non-distended, no organomegaly or masses appreciated. BS present in all quadrants Skin: no rash, ulcers or induration seen on limited exam Musculoskeletal: grossly normal tone BUE/BLE, good ROM, no bony abnormality or joint deformities observed Psychiatric: grossly normal mood and affect, speech fluent and appropriate, alert and oriented x3 Neurologic: CN II-XII grossly intact, moves all extremities in coordinated fashion, sensation intact  Labs on Admission: I have personally reviewed following labs and imaging studies  CBC, BMP  GFR: Estimated Creatinine Clearance: 40.3 mL/min (A) (by C-G formula based on SCr of 1.55 mg/dL (H)).   Creatinine Clearance: Estimated Creatinine Clearance: 40.3 mL/min (A) (by C-G formula based on SCr of 1.55 mg/dL (H)).    Radiological Exams on Admission: Dg Chest 2 View  Result Date: 05/08/2017 CLINICAL DATA:  Generalized weakness. Shortness of breath on exertion. Decreased P.O. intake for 3 weeks. EXAM: CHEST  2 VIEW COMPARISON:  CT chest 03/05/2017.  PA and lateral chest 02/25/2017. FINDINGS: There is cardiomegaly without edema. The patient is status post CABG with a pacing device in place. Small bilateral pleural effusions and basilar airspace disease are greater on the left and increased slightly since the prior plain films. No pneumothorax. IMPRESSION: Small right greater than left pleural effusions and basilar airspace disease have increased since the prior plain films. Cardiomegaly without edema. Electronically Signed   By: Inge Rise M.D.   On: 05/08/2017 11:51   Ct Angio Chest Pe W And/or Wo Contrast  Result Date: 05/08/2017 CLINICAL DATA:  Shortness of breath and generalized weakness. EXAM: CT ANGIOGRAPHY CHEST WITH CONTRAST  TECHNIQUE: Multidetector CT imaging of the chest was performed using the standard protocol during bolus administration of intravenous contrast. Multiplanar CT image reconstructions and MIPs were obtained to evaluate the vascular anatomy. CONTRAST:  57m ISOVUE-370 IOPAMIDOL (ISOVUE-370) INJECTION 76% COMPARISON:  03/05/2017. FINDINGS: Cardiovascular: Negative for pulmonary embolus. Atherosclerotic calcification of the arterial vasculature. Pulmonary arteries and heart are enlarged. No pericardial effusion. Mediastinum/Nodes: Mediastinal and right  hilar lymph nodes are not enlarged by CT size criteria. No axillary adenopathy. Esophagus is grossly unremarkable. Lungs/Pleura: Image quality is degraded by respiratory motion. Moderate right pleural effusion with compressive atelectasis in the right lower lobe. Small left pleural effusion with minimal compressive atelectasis in the left lower lobe. 6 mm anterior left upper lobe nodule (series 6, image 69), appears new. Additional scattered pulmonary nodules measure 4 mm or less in size and are stable. Airway is unremarkable. Upper Abdomen: Visualized portions of the liver, gallbladder, adrenal glands, kidneys, spleen, pancreas, stomach and bowel are grossly unremarkable. No upper abdominal adenopathy. Musculoskeletal: Degenerative changes in the spine. No worrisome lytic or sclerotic lesions. Review of the MIP images confirms the above findings. IMPRESSION: 1. Bilateral pleural effusions and associated compressive atelectasis, right greater than left. 2. 6 mm subpleural left upper lobe nodule appears new from 03/05/2017. Consider short-term follow-up in 3 months with CT chest without contrast, as clinically indicated. 3. Additional scattered pulmonary nodules are stable. 4.  Aortic atherosclerosis (ICD10-170.0). 5.  Emphysema (ICD10-J43.9). 6. Enlarged pulmonary arteries, indicative of pulmonary arterial hypertension. Electronically Signed   By: Lorin Picket M.D.   On:  05/08/2017 13:15    EKG: pending  Assessment/Plan Principal Problem:   GI bleed Active Problems:   Essential hypertension   Dyslipidemia   CAD status post CABG   Persistent atrial fibrillation (HCC)   CKD (chronic kidney disease) stage 3, GFR 30-59 ml/min (HCC)   Chronic combined systolic and diastolic heart failure (HCC)     GI Bleeding - hold Eliquis, continue IV Protonix and NPO Monitor Fecal occult blood, Hgb and transfuse as indicated if Hgb drops <10 and /or hemodynamically unstable Current Hgb is 13.1 and was 15.0 in October GU consult requested by the ED  Hypertension - currently BP is elevated, but patient did not have his regular meds since morning Will start IV Lopressor and, if needed IV Hydralazine prn for elevated BP   CAD with known ICM - start NTG prn in case of chest pain, now is stable  CHF - currently stable, will hold diuretic at this point as patient seems somewhat dry Continue to monitor daily weight and I/O I/O   CKD - stage III, creatinine at baseline, continue to monitor   DVT prophylaxis: SCD Code Status: Full Family Communication: none at the tome of admission Disposition Plan: telemetry Consults called: GI service paged by the ED MD Admission status: inpatient   York Grice, PA-C Pager: [phone] [facility]  If 7PM-7AM, please contact night-coverage [email or web address] Password [email or web address]  05/08/2017, 2:52 PM

## 2017-05-08 NOTE — ED Notes (Signed)
PT did not have to use RR at this time

## 2017-05-09 ENCOUNTER — Encounter (HOSPITAL_COMMUNITY)
Admission: EM | Disposition: A | Discharge status: Home / Self Care | Payer: Self-pay | Source: Home / Self Care | Attending: Physician Assistant

## 2017-05-09 ENCOUNTER — Inpatient Hospital Stay (HOSPITAL_COMMUNITY): Payer: Medicare HMO | Admitting: Anesthesiology

## 2017-05-09 ENCOUNTER — Encounter (HOSPITAL_COMMUNITY): Payer: Self-pay | Admitting: Anesthesiology

## 2017-05-09 ENCOUNTER — Inpatient Hospital Stay (HOSPITAL_COMMUNITY): Payer: Medicare HMO

## 2017-05-09 DIAGNOSIS — I130 Hypertensive heart and chronic kidney disease with heart failure and stage 1 through stage 4 chronic kidney disease, or unspecified chronic kidney disease: Secondary | ICD-10-CM | POA: Diagnosis not present

## 2017-05-09 DIAGNOSIS — E0590 Thyrotoxicosis, unspecified without thyrotoxic crisis or storm: Secondary | ICD-10-CM | POA: Diagnosis not present

## 2017-05-09 DIAGNOSIS — R634 Abnormal weight loss: Secondary | ICD-10-CM | POA: Diagnosis not present

## 2017-05-09 DIAGNOSIS — K449 Diaphragmatic hernia without obstruction or gangrene: Secondary | ICD-10-CM | POA: Diagnosis not present

## 2017-05-09 DIAGNOSIS — K922 Gastrointestinal hemorrhage, unspecified: Secondary | ICD-10-CM | POA: Diagnosis not present

## 2017-05-09 DIAGNOSIS — E1122 Type 2 diabetes mellitus with diabetic chronic kidney disease: Secondary | ICD-10-CM | POA: Diagnosis not present

## 2017-05-09 DIAGNOSIS — K259 Gastric ulcer, unspecified as acute or chronic, without hemorrhage or perforation: Secondary | ICD-10-CM | POA: Diagnosis not present

## 2017-05-09 DIAGNOSIS — N183 Chronic kidney disease, stage 3 (moderate): Secondary | ICD-10-CM | POA: Diagnosis not present

## 2017-05-09 DIAGNOSIS — I25758 Atherosclerosis of native coronary artery of transplanted heart with other forms of angina pectoris: Secondary | ICD-10-CM | POA: Diagnosis not present

## 2017-05-09 DIAGNOSIS — I481 Persistent atrial fibrillation: Secondary | ICD-10-CM | POA: Diagnosis not present

## 2017-05-09 DIAGNOSIS — E785 Hyperlipidemia, unspecified: Secondary | ICD-10-CM | POA: Diagnosis not present

## 2017-05-09 DIAGNOSIS — R1084 Generalized abdominal pain: Secondary | ICD-10-CM | POA: Diagnosis not present

## 2017-05-09 DIAGNOSIS — I5042 Chronic combined systolic (congestive) and diastolic (congestive) heart failure: Secondary | ICD-10-CM

## 2017-05-09 DIAGNOSIS — K319 Disease of stomach and duodenum, unspecified: Secondary | ICD-10-CM | POA: Diagnosis not present

## 2017-05-09 DIAGNOSIS — K921 Melena: Secondary | ICD-10-CM | POA: Diagnosis not present

## 2017-05-09 DIAGNOSIS — E440 Moderate protein-calorie malnutrition: Secondary | ICD-10-CM

## 2017-05-09 DIAGNOSIS — R195 Other fecal abnormalities: Secondary | ICD-10-CM | POA: Diagnosis not present

## 2017-05-09 HISTORY — PX: ESOPHAGOGASTRODUODENOSCOPY (EGD) WITH PROPOFOL: SHX5813

## 2017-05-09 LAB — CBC
HCT: 40.4 % (ref 39.0–52.0)
HCT: 40.9 % (ref 39.0–52.0)
HEMATOCRIT: 39.6 % (ref 39.0–52.0)
HEMATOCRIT: 42.3 % (ref 39.0–52.0)
HEMOGLOBIN: 13 g/dL (ref 13.0–17.0)
Hemoglobin: 13.2 g/dL (ref 13.0–17.0)
Hemoglobin: 14.3 g/dL (ref 13.0–17.0)
Hemoglobin: 13.1 g/dL (ref 13.0–17.0)
MCH: 28.2 pg (ref 26.0–34.0)
MCH: 28.6 pg (ref 26.0–34.0)
MCH: 29 pg (ref 26.0–34.0)
MCH: 29.9 pg (ref 26.0–34.0)
MCHC: 32 g/dL (ref 30.0–36.0)
MCHC: 32.7 g/dL (ref 30.0–36.0)
MCHC: 32.8 g/dL (ref 30.0–36.0)
MCHC: 33.8 g/dL (ref 30.0–36.0)
MCV: 88.3 fL (ref 78.0–100.0)
MCV: 87.4 fL (ref 78.0–100.0)
MCV: 88.2 fL (ref 78.0–100.0)
MCV: 88 fL (ref 78.0–100.0)
PLATELETS: 254 10*3/uL (ref 150–400)
PLATELETS: 267 10*3/uL (ref 150–400)
PLATELETS: 254 10*3/uL (ref 150–400)
Platelets: 267 10*3/uL (ref 150–400)
RBC: 4.49 MIL/uL (ref 4.22–5.81)
RBC: 4.62 MIL/uL (ref 4.22–5.81)
RBC: 4.65 MIL/uL (ref 4.22–5.81)
RBC: 4.79 MIL/uL (ref 4.22–5.81)
RDW: 13.3 % (ref 11.5–15.5)
RDW: 13.4 % (ref 11.5–15.5)
RDW: 13.4 % (ref 11.5–15.5)
RDW: 13.4 % (ref 11.5–15.5)
WBC: 8.6 10*3/uL (ref 4.0–10.5)
WBC: 8.7 10*3/uL (ref 4.0–10.5)
WBC: 9 10*3/uL (ref 4.0–10.5)
WBC: 9 10*3/uL (ref 4.0–10.5)

## 2017-05-09 LAB — BASIC METABOLIC PANEL
Anion gap: 11 (ref 5–15)
BUN: 29 mg/dL — AB (ref 6–20)
CHLORIDE: 109 mmol/L (ref 101–111)
CO2: 20 mmol/L — ABNORMAL LOW (ref 22–32)
CREATININE: 1.54 mg/dL — AB (ref 0.61–1.24)
Calcium: 8.7 mg/dL — ABNORMAL LOW (ref 8.9–10.3)
GFR calc Af Amer: 47 mL/min — ABNORMAL LOW (ref >60)
GFR calc non Af Amer: 40 mL/min — ABNORMAL LOW (ref >60)
Glucose, Bld: 126 mg/dL — ABNORMAL HIGH (ref 65–99)
POTASSIUM: 4.7 mmol/L (ref 3.5–5.1)
Sodium: 140 mmol/L (ref 135–145)

## 2017-05-09 LAB — POTASSIUM: Potassium: 4.2 mmol/L (ref 3.5–5.1)

## 2017-05-09 LAB — MAGNESIUM: Magnesium: 2.2 mg/dL (ref 1.7–2.4)

## 2017-05-09 SURGERY — EGD (ESOPHAGOGASTRODUODENOSCOPY)
Anesthesia: Monitor Anesthesia Care

## 2017-05-09 SURGERY — ESOPHAGOGASTRODUODENOSCOPY (EGD) WITH PROPOFOL
Anesthesia: Monitor Anesthesia Care

## 2017-05-09 MED ORDER — IOPAMIDOL (ISOVUE-300) INJECTION 61%
INTRAVENOUS | Status: AC
  Filled 2017-05-09: qty 30

## 2017-05-09 MED ORDER — LACTATED RINGERS IV SOLN
INTRAVENOUS | Status: DC | PRN
  Administered 2017-05-09: 13:00:00 via INTRAVENOUS

## 2017-05-09 MED ORDER — SODIUM CHLORIDE 0.9 % IV SOLN
INTRAVENOUS | Status: DC
  Administered 2017-05-09: 09:00:00 via INTRAVENOUS

## 2017-05-09 SURGICAL SUPPLY — 15 items

## 2017-05-09 NOTE — Anesthesia Postprocedure Evaluation (Signed)
Anesthesia Post Note  Patient: [name]  Procedure(s) Performed: ESOPHAGOGASTRODUODENOSCOPY (EGD) WITH PROPOFOL (N/A )     Patient location during evaluation: PACU Anesthesia Type: MAC Level of consciousness: awake and alert Pain management: pain level controlled Vital Signs Assessment: post-procedure vital signs reviewed and stable Respiratory status: spontaneous breathing, nonlabored ventilation, respiratory function stable and patient connected to nasal cannula oxygen Cardiovascular status: stable and blood pressure returned to baseline Postop Assessment: no apparent nausea or vomiting Anesthetic complications: no    Last Vitals:  Vitals:   05/09/17 1445 05/09/17 1505  BP: (!) 118/49 (!) 157/76  Pulse: 67 85  Resp: (!) 21   Temp:  (!) 36.3 C  SpO2: 93% 94%    Last Pain:  Vitals:   05/09/17 1505  TempSrc: Oral  PainSc:                  Effie Berkshire

## 2017-05-09 NOTE — Progress Notes (Signed)
Pt in wide QRS with HR in 140's sustained after arriving back to room from Endo. Asymptomatic. VSS. Sats 95 % on RA. Placed on O2 @ 2 Liters via [place]. Dr. Tana Coast notified via text messaging.

## 2017-05-09 NOTE — Anesthesia Preprocedure Evaluation (Addendum)
Anesthesia Evaluation  Patient identified by MRN, date of birth, ID band Patient awake    Reviewed: Allergy & Precautions, NPO status , Patient's Chart, lab work & pertinent test results  Airway Mallampati: I  TM Distance: >3 FB Neck ROM: Full    Dental  (+) Teeth Intact, Dental Advisory Given   Pulmonary asthma ,    breath sounds clear to auscultation       Cardiovascular hypertension, Pt. on home beta blockers + CAD, + CABG and +CHF  + dysrhythmias Atrial Fibrillation + Cardiac Defibrillator  Rhythm:Regular Rate:Normal  S/p CABG, AVR   Neuro/Psych negative neurological ROS     GI/Hepatic hiatal hernia, GERD  Medicated,(+) Hepatitis -  Endo/Other  diabetes  Renal/GU CRFRenal disease     Musculoskeletal   Abdominal Normal abdominal exam  (+)   Peds  Hematology   Anesthesia Other Findings   Reproductive/Obstetrics                            Lab Results  Component Value Date   WBC 9.0 05/09/2017   HGB 13.2 05/09/2017   HCT 40.4 05/09/2017   MCV 87.4 05/09/2017   PLT 254 05/09/2017   Lab Results  Component Value Date   CREATININE 1.54 (H) 05/09/2017   BUN 29 (H) 05/09/2017   NA 140 05/09/2017   K 4.7 05/09/2017   CL 109 05/09/2017   CO2 20 (L) 05/09/2017   Lab Results  Component Value Date   INR 1.52 05/08/2017   INR 1.4 (H) 11/01/2014   INR 1.3 (H) 01/23/2013   Echo: - Left ventricle: The cavity size was mildly dilated. Wall   thickness was increased in a pattern of mild LVH. The estimated   ejection fraction was 25%. Diffuse hypokinesis. Doppler   parameters are consistent with abnormal left ventricular   relaxation (grade 1 diastolic dysfunction). - Aortic valve: Bioprosthetic aortic valve is present. No   significant bioprosthetic valve stenosis. Mean gradient (S): 11   mm Hg. - Mitral valve: Moderately calcified annulus. There was trivial   regurgitation. - Left  atrium: The atrium was moderately dilated. - Right ventricle: The cavity size was normal. Pacer wire or   catheter noted in right ventricle. Systolic function was mildly   reduced. - Pulmonary arteries: No complete TR doppler jet so unable to   estimate PA systolic pressure. - Inferior vena cava: The vessel was normal in size. The   respirophasic diameter changes were in the normal range (= 50%),   consistent with normal central venous pressure.   Anesthesia Physical Anesthesia Plan  ASA: IV  Anesthesia Plan: MAC   Post-op Pain Management:    Induction: Intravenous  PONV Risk Score and Plan: 1 and Propofol infusion  Airway Management Planned: Natural Airway and Nasal Cannula  Additional Equipment: None  Intra-op Plan:   Post-operative Plan:   Informed Consent: I have reviewed the patients History and Physical, chart, labs and discussed the procedure including the risks, benefits and alternatives for the proposed anesthesia with the patient or authorized representative who has indicated his/her understanding and acceptance.     Plan Discussed with:   Anesthesia Plan Comments:        Anesthesia Quick Evaluation

## 2017-05-09 NOTE — Progress Notes (Signed)
Initial Nutrition Assessment  DOCUMENTATION CODES:   Non-severe (moderate) malnutrition in context of acute illness/injury  INTERVENTION:   When diet advanced, add Ensure Enlive po TID, each supplement provides 350 kcal and 20 grams of protein  NUTRITION DIAGNOSIS:   Moderate Malnutrition related to acute illness(GI bleed) as evidenced by energy intake < 75% for > 7 days, percent weight loss(12.5% weight loss within 2 months).  GOAL:   Patient will meet greater than or equal to 90% of their needs  MONITOR:   Diet advancement, PO intake, Supplement acceptance  REASON FOR ASSESSMENT:   Malnutrition Screening Tool    ASSESSMENT:   84 yo male with PMH of HLD, HTN, Barrett's esophagus, CAD, cardiomyopathy, AICD, DM, GERD, CKD, and CHF who was admitted on 1/30 with weakness and SOB.  Work-up underway for etiology of illness; GI bleed vs CHF. Plans for EGD today. Currently NPO. Patient reports poor intake for the past month, was only able to sip on liquids. He was drinking 1 Ensure every day. Intake was providing < 75% of estimated needs for the past several weeks. 12.5% weight loss within the past 2 months. Labs and medications reviewed.  NUTRITION - FOCUSED PHYSICAL EXAM:    Most Recent Value  Orbital Region  Mild depletion  Upper Arm Region  Mild depletion  Thoracic and Lumbar Region  Mild depletion  Buccal Region  Mild depletion  Temple Region  Moderate depletion  Clavicle Bone Region  Moderate depletion  Clavicle and Acromion Bone Region  Mild depletion  Scapular Bone Region  Mild depletion  Dorsal Hand  Mild depletion  Patellar Region  Moderate depletion  Anterior Thigh Region  Moderate depletion  Posterior Calf Region  Moderate depletion  Edema (RD Assessment)  None  Hair  Reviewed  Eyes  Reviewed  Mouth  Reviewed  Skin  Reviewed  Nails  Reviewed       Diet Order:  Diet NPO time specified  EDUCATION NEEDS:   No education needs have been identified at  this time  Skin:  Skin Assessment: Reviewed RN Assessment  Last BM:  1/30  Height:   Ht Readings from Last 1 Encounters:  05/08/17 6' (1.829 m)    Weight:   Wt Readings from Last 1 Encounters:  05/09/17 183 lb 9.6 oz (83.3 kg)    Ideal Body Weight:  80.9 kg  BMI:  Body mass index is 24.9 kg/m.  Estimated Nutritional Needs:   Kcal:  2000-2200  Protein:  100-120 gm  Fluid:  2-2.2 L   Molli Barrows, RD, LDN, [place] Pager [phone] After Hours Pager [phone]

## 2017-05-09 NOTE — Progress Notes (Signed)
Oral contrast given to pt to drink for CT Abdomen today.

## 2017-05-09 NOTE — Progress Notes (Addendum)
[facility]                                                                              Patient Demographics  [name], is a 84 y.o. male, DOB - [birth year], ZHG:[id number]  Admit date - 05/08/2017   Admitting Physician Karmen Bongo, MD  Outpatient Primary MD for the patient is [name], MD  Outpatient specialists:   LOS - 1  days   Medical records reviewed and are as summarized below:    Chief Complaint  Patient presents with  . Melena       Brief summary   Patient is a 84 year old male with CAD with h/o CABG, CHF, severe AoS - s/p replacement with tissue valve, s/p AICD for VT (recently amiodarone was discontinued due to SOB and started on mexiletine), CM with EF 25-30%, AF - on Eliquis, orthostatic hypotension, COPD, DM type II, HLD  who presented to the ED with complains profound weakness and inability to perform ADLs without assistance, poor appetite with early satiety, nausea and weight loss that has been going on for almost 2 weeks. Patient reported weight loss of nearly 20 pounds within that time and since a day before the admission, he had 3 bouts of melena. Patient was seen on the day of admission by his cardiologist, Dr. Haroldine Laws who did not feel like his symptoms were related to CHF exacerbation and referred the patient to ED.    Assessment & Plan    Principal Problem:   GI bleed in the setting of anticoagulation, Eliquis -Eliquis held,, FOBT positive, reported black stools for last several days. -Per patient EGD several years ago and had history of peptic ulcers.  GI consulted by admitting physician, Dr Collene Mares to see him for further workup -Avoid NSAIDs, placed on IV PPI, Zofran as needed Addendum: EGD done  - 4cm hiatal hernia, few non bleeding erosions in gastric body and antrum. Biopsies done.  - await recommendations from GI if/when okay to start to eliquis   Active Problems: Weight loss, failure to thrive - Unclear etiology  for now, GI has been consulted, if GI evaluation is negative, obtain CT abdomen, pelvis, patient also reported night sweats.  Weight 209 lbs on 10/22, 196 on 1/14, 184 at admission.  -  CT chest showed bilateral pleural effusions, associated compressive atelectasis, 6 mm subpleural left upper lobe nodule appears new, recommended short-term follow-up in 3 months with CT chest.  Large pulmonary arteries indicative of pulmonary arterial hypertension. -Alternatively, could have end-stage CHF with progressive FTT - EGD negative for any mass or tumor. Will check CT abd, pelvis to r/o any lymphoma or malignancy.   - ordered PT evaluation    Essential hypertension -BP currently stable, continue IV Lopressor scheduled and hydralazine as needed  CHF, chronic systolic and diastolic, CAD status post CABG/AVR 2009 -2D echo 5/18 had shown EF of 25% with diffuse hypokinesis, grade 1 diastolic dysfunction, bioprosthetic aortic valve -Per Dr. Lavonia Drafts, euvolemic or somewhat dry, may represent end-stage CHF.  No ACE/ARB are due to CKD  History of recent VT -Patient had been seen by EP, Dr. Curt Bears on 04/18/2017 and  patient was switched to mexiletine due to shortness of breath, amiodarone was discontinued  History of lung Nodules - CT chest showed bilateral pleural effusions, associated compressive atelectasis, 6 mm subpleural left upper lobe nodule appears new, recommended short-term follow-up in 3 months with CT chest.  Large pulmonary arteries indicative of pulmonary arterial hypertension.    Persistent atrial fibrillation (HCC) -Continue IV metoprolol, Eliquis held due to GI bleed    CKD (chronic kidney disease) stage 3, GFR 30-59 ml/min (HCC) -Baseline creatinine 1.3-1.5, currently creatinine at baseline  Moderate malnutrtion - dietician consulted.     Code Status: Full code DVT Prophylaxis: SCD's  Family Communication: Discussed in detail with the patient, all imaging results, lab results  explained to the patient    Disposition Plan: if above w/u negative, will refer to PCP for further investigation. PT eval   Time Spent in minutes  35 minutes  Procedures:    Consultants:   Gastroenterology  Antimicrobials:      Medications  Scheduled Meds: . feeding supplement  1 Container Oral TID BM  . metoprolol tartrate  5 mg Intravenous Q12H  . pantoprazole (PROTONIX) IV  40 mg Intravenous Q12H   Continuous Infusions: . sodium chloride 20 mL/hr at 05/09/17 0905   PRN Meds:.acetaminophen **OR** acetaminophen, hydrALAZINE, lip balm, morphine injection, nitroGLYCERIN, ondansetron **OR** ondansetron (ZOFRAN) IV   Antibiotics   Anti-infectives (From admission, onward)   None        Subjective:   Veron Senner was seen and examined today.  Feels overall weak, dyspnea on exertion, loss of appetite and loss of weight.  Has night sweats.  Mildly tender in the epigastric region.  Patient denies dizziness, chest pain, fever chills or any vomiting.  Objective:   Vitals:   05/08/17 1600 05/08/17 1631 05/08/17 2200 05/09/17 0622  BP: (!) 152/76 (!) 175/68 137/60 (!) 132/57  Pulse: 80 90 70 70  Resp: _0 Temp:  97.7 F (36.5 C) 98 F (36.7 C) 98.2 F (36.8 C)  TempSrc:  Oral Oral Oral  SpO2: 94% 100% 95% 95%  Weight:  83.8 kg (184 lb 11.2 oz)  83.3 kg (183 lb 9.6 oz)  Height:        Intake/Output Summary (Last 24 hours) at 05/09/2017 1113 Last data filed at 05/08/2017 2000 Gross per 24 hour  Intake 360 ml  Output -  Net 360 ml     Wt Readings from Last 3 Encounters:  05/09/17 83.3 kg (183 lb 9.6 oz)  05/08/17 86.6 kg (191 lb)  05/06/17 88.4 kg (194 lb 12.8 oz)     Exam  General: Alert and oriented x 3, NAD  Eyes:   HEENT:  Atraumatic, normocephalic, normal oropharynx  Cardiovascular: S1 S2 clear,. Regular rate and rhythm.  Respiratory: Diminished breath sounds at the bases  Gastrointestinal: Soft, tender in the epigastrium,   nondistended, + bowel sounds  Ext: no pedal edema bilaterally  Neuro: no new deficits  Musculoskeletal: No digital cyanosis, clubbing  Skin: No rashes  Psych: Normal affect and demeanor, alert and oriented x3    Data Reviewed:  I have personally reviewed following labs and imaging studies  Micro Results Recent Results (from the past 240 hour(s))  MRSA PCR Screening     Status: None   Collection Time: 05/08/17  4:37 PM  Result Value Ref Range Status   MRSA by PCR NEGATIVE NEGATIVE Final    Comment:        The GeneXpert MRSA  Assay (FDA approved for NASAL specimens only), is one component of a comprehensive MRSA colonization surveillance program. It is not intended to diagnose MRSA infection nor to guide or monitor treatment for MRSA infections.     Radiology Reports Dg Chest 2 View  Result Date: 05/08/2017 CLINICAL DATA:  Generalized weakness. Shortness of breath on exertion. Decreased P.O. intake for 3 weeks. EXAM: CHEST  2 VIEW COMPARISON:  CT chest 03/05/2017.  PA and lateral chest 02/25/2017. FINDINGS: There is cardiomegaly without edema. The patient is status post CABG with a pacing device in place. Small bilateral pleural effusions and basilar airspace disease are greater on the left and increased slightly since the prior plain films. No pneumothorax. IMPRESSION: Small right greater than left pleural effusions and basilar airspace disease have increased since the prior plain films. Cardiomegaly without edema. Electronically Signed   By: Inge Rise M.D.   On: 05/08/2017 11:51   Ct Angio Chest Pe W And/or Wo Contrast  Result Date: 05/08/2017 CLINICAL DATA:  Shortness of breath and generalized weakness. EXAM: CT ANGIOGRAPHY CHEST WITH CONTRAST TECHNIQUE: Multidetector CT imaging of the chest was performed using the standard protocol during bolus administration of intravenous contrast. Multiplanar CT image reconstructions and MIPs were obtained to evaluate the vascular  anatomy. CONTRAST:  44m ISOVUE-370 IOPAMIDOL (ISOVUE-370) INJECTION 76% COMPARISON:  03/05/2017. FINDINGS: Cardiovascular: Negative for pulmonary embolus. Atherosclerotic calcification of the arterial vasculature. Pulmonary arteries and heart are enlarged. No pericardial effusion. Mediastinum/Nodes: Mediastinal and right hilar lymph nodes are not enlarged by CT size criteria. No axillary adenopathy. Esophagus is grossly unremarkable. Lungs/Pleura: Image quality is degraded by respiratory motion. Moderate right pleural effusion with compressive atelectasis in the right lower lobe. Small left pleural effusion with minimal compressive atelectasis in the left lower lobe. 6 mm anterior left upper lobe nodule (series 6, image 69), appears new. Additional scattered pulmonary nodules measure 4 mm or less in size and are stable. Airway is unremarkable. Upper Abdomen: Visualized portions of the liver, gallbladder, adrenal glands, kidneys, spleen, pancreas, stomach and bowel are grossly unremarkable. No upper abdominal adenopathy. Musculoskeletal: Degenerative changes in the spine. No worrisome lytic or sclerotic lesions. Review of the MIP images confirms the above findings. IMPRESSION: 1. Bilateral pleural effusions and associated compressive atelectasis, right greater than left. 2. 6 mm subpleural left upper lobe nodule appears new from 03/05/2017. Consider short-term follow-up in 3 months with CT chest without contrast, as clinically indicated. 3. Additional scattered pulmonary nodules are stable. 4.  Aortic atherosclerosis (ICD10-170.0). 5.  Emphysema (ICD10-J43.9). 6. Enlarged pulmonary arteries, indicative of pulmonary arterial hypertension. Electronically Signed   By: MLorin PicketM.D.   On: 05/08/2017 13:15    Lab Data:  CBC: Recent Labs  Lab 05/08/17 1048 05/08/17 1815 05/09/17 0012 05/09/17 0714  WBC 8.5 9.2 8.7 9.0  NEUTROABS 5.3  --   --   --   HGB 13.1 13.2 13.0 13.2  HCT 40.6 40.9 39.6 40.4    MCV 87.5 87.8 88.2 87.4  PLT 270 258 267 2419  Basic Metabolic Panel: Recent Labs  Lab 05/06/17 1110 05/08/17 1048 05/09/17 0012  NA 139 140 140  K 4.5 4.1 4.7  CL 101 108 109  CO2 20 21* 20*  GLUCOSE 118* 131* 126*  BUN 30* 28* 29*  CREATININE 1.53* 1.55* 1.54*  CALCIUM 8.9 8.8* 8.7*   GFR: Estimated Creatinine Clearance: 40.6 mL/min (A) (by C-G formula based on SCr of 1.54 mg/dL (H)). Liver Function Tests: Recent Labs  Lab 05/08/17 1048  AST 34  ALT 38  ALKPHOS 60  BILITOT 0.9  PROT 5.9*  ALBUMIN 3.0*   No results for input(s): LIPASE, AMYLASE in the last 168 hours. No results for input(s): AMMONIA in the last 168 hours. Coagulation Profile: Recent Labs  Lab 05/08/17 1048  INR 1.52   Cardiac Enzymes: No results for input(s): CKTOTAL, CKMB, CKMBINDEX, TROPONINI in the last 168 hours. BNP (last 3 results) Recent Labs    04/18/17 1126 04/23/17 1048 05/06/17 1110  PROBNP 10,928* 10,848* 22,420*   HbA1C: Recent Labs    05/08/17 1815  HGBA1C 6.3*   CBG: No results for input(s): GLUCAP in the last 168 hours. Lipid Profile: No results for input(s): CHOL, HDL, LDLCALC, TRIG, CHOLHDL, LDLDIRECT in the last 72 hours. Thyroid Function Tests: No results for input(s): TSH, T4TOTAL, FREET4, T3FREE, THYROIDAB in the last 72 hours. Anemia Panel: No results for input(s): VITAMINB12, FOLATE, FERRITIN, TIBC, IRON, RETICCTPCT in the last 72 hours. Urine analysis:    Component Value Date/Time   COLORURINE YELLOW 05/08/2017 [place] 05/08/2017 1353   LABSPEC 1.033 (H) 05/08/2017 1353   PHURINE 5.0 05/08/2017 1353   GLUCOSEU NEGATIVE 05/08/2017 1353   HGBUR NEGATIVE 05/08/2017 [place] 05/08/2017 1353   KETONESUR NEGATIVE 05/08/2017 1353   PROTEINUR NEGATIVE 05/08/2017 1353   UROBILINOGEN 0.2 03/08/2008 1519   NITRITE NEGATIVE 05/08/2017 1353   LEUKOCYTESUR NEGATIVE 05/08/2017 1353     Ripudeep Rai M.D. [facility] 05/09/2017, 11:13 AM  Pager: [phone] Between 7am to 7pm - call Pager - [phone]  After 7pm go to [email or web address] - password [email or web address]  Call night coverage person covering after 7pm

## 2017-05-09 NOTE — Progress Notes (Signed)
Pt having long runs of sustaine d wide QRS. Pt asymptomatic. Dr. Tana Coast notified. Lab  s ordered. Evening dose of Metoprolol given at this time  .

## 2017-05-09 NOTE — Progress Notes (Signed)
Starting to drink second bottle of oral contrast. Tolerates well.

## 2017-05-09 NOTE — Consult Note (Signed)
Reason for Consult: Heme positive stool, melena, and 21 lbs weight loss Referring Physician: [facility]  [name] HPI: This is an 84 year old male with a PMH of CHF s/p AICD placement, EF of 25%, DM, HTN, hyperlipidemia, and GERD admitted for complaints of fatigue and SOB.  While in the CHF clinic the patient was sent to the ER as a result of his complaints of weight loss and melena.  In the ER the patient was noted to be heme positive, but there was no significant anemia.  His HGB was essentially unchanged in the 13 range from 2 weeks ago.  There is a history of Barrett's esophagus and he reports undergoing an endoscopic procedures, EGD and colonoscopy, in the past.  He does not recall the physician name, but it is here in [place].  The patient states that his last colonoscopy was 2 years ago and it was normal.  Past Medical History:  Diagnosis Date  . AICD (automatic cardioverter/defibrillator) present   . Aortic valve disorder 2009   severe AS s/p AVR with pericardial tissue valve  . Arthritis    "minor everywhere" (04/22/2015)  . Asthma    "a touch"  . Barrett esophagus   . CAD (coronary artery disease)    s/p 2 vessel CABG with LIMA to LAD, SVG to diagonal 1, SVG to RCA 12/09  . Cardiomyopathy ([place])    EF 15-20% by echo 2017  . Chronic combined systolic and diastolic CHF (congestive heart failure) (HCC)    dry weight 197-200lbs.  . CKD (chronic kidney disease)   . Diabetes ([place])    "I'm prediabetic" (04/22/2015)  . GERD (gastroesophageal reflux disease)   . H/O: rheumatic fever   . Hepatitis 1957   "don't know what kind:  . History of hiatal hernia   . History of PFTs    a. Amiodarone started 5/16 >> PFTs w/ DLCO 6/16:  FEV1 72% predicted, FEV1/FVC 66%, DLCO 66% >> minimal reversible obstructive airways disease with mild diffusion defect (suggestive of emphysema but absence of hyperinflation inconsistent with dx)  . HTN (hypertension)   . Hypercholesteremia   .  Orthostatic hypotension   . Persistent atrial fibrillation ([place])    post op afib with no reoccurence  . Pneumonia 08/2014   "dr thought I may have had a touch"    Past Surgical History:  Procedure Laterality Date  . AORTIC VALVE REPLACEMENT  with 23-mm Magna Ease pericardial valve, model number   with 23-mm Magna Ease pericardial valve, model [id number], serial number [id number]   . APPENDECTOMY  1940s  . BI-VENTRICULAR IMPLANTABLE CARDIOVERTER DEFIBRILLATOR  (CRT-D)  04/22/2015  . CARDIAC CATHETERIZATION N/A 11/04/2014   Procedure: Left Heart Cath and Cors/Grafts Angiography;  Surgeon: Leonie Man, MD;  Location: [facility];  Service: Cardiovascular;  Laterality: N/A;  . CARDIAC VALVE REPLACEMENT    . CARDIOVERSION N/A 01/27/2013   Procedure: CARDIOVERSION;  Surgeon: Sueanne Margarita, MD;  Location: [facility];  Service: Cardiovascular;  Laterality: N/A;  . CARDIOVERSION N/A 08/25/2014   Procedure: CARDIOVERSION;  Surgeon: Sanda Klein, MD;  Location: [facility];  Service: Cardiovascular;  Laterality: N/A;  . CATARACT EXTRACTION W/ INTRAOCULAR LENS  IMPLANT, BILATERAL Bilateral   . CORONARY ARTERY BYPASS GRAFT  03/10/2008   x 3 Dr. Roxan Hockey  . EP IMPLANTABLE DEVICE N/A 04/22/2015   Procedure: BiV ICD Insertion CRT-D;  Surgeon: [name], MD;  Location: [facility];  Service: Cardiovascular;  Laterality:  N/A;  . ESOPHAGOGASTRODUODENOSCOPY (EGD) WITH ESOPHAGEAL DILATION  X1  . TONSILLECTOMY      Family History  Problem Relation Age of Onset  . Alzheimer's disease Mother   . COPD Daughter   . Alzheimer's disease Unknown   . COPD Sister   . Depression Sister   . Heart disease Sister   . Hyperlipidemia Sister   . Hypertension Sister   . Esophageal cancer Sister        + smoker    Social History:  reports that  has never smoked. he has never used smokeless tobacco. He reports that he drinks alcohol. He reports that he does not use  drugs.  Allergies:  Allergies  Allergen Reactions  . Novocain [Procaine] Swelling    Medications:  Scheduled: . feeding supplement  1 Container Oral TID BM  . metoprolol tartrate  5 mg Intravenous Q12H  . pantoprazole (PROTONIX) IV  40 mg Intravenous Q12H   Continuous: . sodium chloride 20 mL/hr at 05/09/17 4801    Results for orders placed or performed during the hospital encounter of 05/08/17 (from the past 24 hour(s))  Urinalysis, Routine w reflex microscopic     Status: Abnormal   Collection Time: 05/08/17  1:53 PM  Result Value Ref Range   Color, Urine YELLOW YELLOW   APPearance CLEAR CLEAR   Specific Gravity, Urine 1.033 (H) 1.005 - 1.030   pH 5.0 5.0 - 8.0   Glucose, UA NEGATIVE NEGATIVE mg/dL   Hgb urine dipstick NEGATIVE NEGATIVE   Bilirubin Urine NEGATIVE NEGATIVE   Ketones, ur NEGATIVE NEGATIVE mg/dL   Protein, ur NEGATIVE NEGATIVE mg/dL   Nitrite NEGATIVE NEGATIVE   Leukocytes, UA NEGATIVE NEGATIVE  MRSA PCR Screening     Status: None   Collection Time: 05/08/17  4:37 PM  Result Value Ref Range   MRSA by PCR NEGATIVE NEGATIVE  Hemoglobin A1c     Status: Abnormal   Collection Time: 05/08/17  6:15 PM  Result Value Ref Range   Hgb A1c MFr Bld 6.3 (H) 4.8 - 5.6 %   Mean Plasma Glucose 134.11 mg/dL  CBC     Status: None   Collection Time: 05/08/17  6:15 PM  Result Value Ref Range   WBC 9.2 4.0 - 10.5 K/uL   RBC 4.66 4.22 - 5.81 MIL/uL   Hemoglobin 13.2 13.0 - 17.0 g/dL   HCT 40.9 39.0 - 52.0 %   MCV 87.8 78.0 - 100.0 fL   MCH 28.3 26.0 - 34.0 pg   MCHC 32.3 30.0 - 36.0 g/dL   RDW 13.4 11.5 - 15.5 %   Platelets 258 150 - 400 K/uL  Basic metabolic panel     Status: Abnormal   Collection Time: 05/09/17 12:12 AM  Result Value Ref Range   Sodium 140 135 - 145 mmol/L   Potassium 4.7 3.5 - 5.1 mmol/L   Chloride 109 101 - 111 mmol/L   CO2 20 (L) 22 - 32 mmol/L   Glucose, Bld 126 (H) 65 - 99 mg/dL   BUN 29 (H) 6 - 20 mg/dL   Creatinine, Ser 1.54 (H) 0.61 -  1.24 mg/dL   Calcium 8.7 (L) 8.9 - 10.3 mg/dL   GFR calc non Af Amer 40 (L) >60 mL/min   GFR calc Af Amer 47 (L) >60 mL/min   Anion gap 11 5 - 15  CBC     Status: None   Collection Time: 05/09/17 12:12 AM  Result Value Ref Range  WBC 8.7 4.0 - 10.5 K/uL   RBC 4.49 4.22 - 5.81 MIL/uL   Hemoglobin 13.0 13.0 - 17.0 g/dL   HCT 39.6 39.0 - 52.0 %   MCV 88.2 78.0 - 100.0 fL   MCH 29.0 26.0 - 34.0 pg   MCHC 32.8 30.0 - 36.0 g/dL   RDW 13.4 11.5 - 15.5 %   Platelets 267 150 - 400 K/uL  CBC     Status: None   Collection Time: 05/09/17  7:14 AM  Result Value Ref Range   WBC 9.0 4.0 - 10.5 K/uL   RBC 4.62 4.22 - 5.81 MIL/uL   Hemoglobin 13.2 13.0 - 17.0 g/dL   HCT 40.4 39.0 - 52.0 %   MCV 87.4 78.0 - 100.0 fL   MCH 28.6 26.0 - 34.0 pg   MCHC 32.7 30.0 - 36.0 g/dL   RDW 13.4 11.5 - 15.5 %   Platelets 254 150 - 400 K/uL     Dg Chest 2 View  Result Date: 05/08/2017 CLINICAL DATA:  Generalized weakness. Shortness of breath on exertion. Decreased P.O. intake for 3 weeks. EXAM: CHEST  2 VIEW COMPARISON:  CT chest 03/05/2017.  PA and lateral chest 02/25/2017. FINDINGS: There is cardiomegaly without edema. The patient is status post CABG with a pacing device in place. Small bilateral pleural effusions and basilar airspace disease are greater on the left and increased slightly since the prior plain films. No pneumothorax. IMPRESSION: Small right greater than left pleural effusions and basilar airspace disease have increased since the prior plain films. Cardiomegaly without edema. Electronically Signed   By: Inge Rise M.D.   On: 05/08/2017 11:51   Ct Angio Chest Pe W And/or Wo Contrast  Result Date: 05/08/2017 CLINICAL DATA:  Shortness of breath and generalized weakness. EXAM: CT ANGIOGRAPHY CHEST WITH CONTRAST TECHNIQUE: Multidetector CT imaging of the chest was performed using the standard protocol during bolus administration of intravenous contrast. Multiplanar CT image reconstructions and  MIPs were obtained to evaluate the vascular anatomy. CONTRAST:  27m ISOVUE-370 IOPAMIDOL (ISOVUE-370) INJECTION 76% COMPARISON:  03/05/2017. FINDINGS: Cardiovascular: Negative for pulmonary embolus. Atherosclerotic calcification of the arterial vasculature. Pulmonary arteries and heart are enlarged. No pericardial effusion. Mediastinum/Nodes: Mediastinal and right hilar lymph nodes are not enlarged by CT size criteria. No axillary adenopathy. Esophagus is grossly unremarkable. Lungs/Pleura: Image quality is degraded by respiratory motion. Moderate right pleural effusion with compressive atelectasis in the right lower lobe. Small left pleural effusion with minimal compressive atelectasis in the left lower lobe. 6 mm anterior left upper lobe nodule (series 6, image 69), appears new. Additional scattered pulmonary nodules measure 4 mm or less in size and are stable. Airway is unremarkable. Upper Abdomen: Visualized portions of the liver, gallbladder, adrenal glands, kidneys, spleen, pancreas, stomach and bowel are grossly unremarkable. No upper abdominal adenopathy. Musculoskeletal: Degenerative changes in the spine. No worrisome lytic or sclerotic lesions. Review of the MIP images confirms the above findings. IMPRESSION: 1. Bilateral pleural effusions and associated compressive atelectasis, right greater than left. 2. 6 mm subpleural left upper lobe nodule appears new from 03/05/2017. Consider short-term follow-up in 3 months with CT chest without contrast, as clinically indicated. 3. Additional scattered pulmonary nodules are stable. 4.  Aortic atherosclerosis (ICD10-170.0). 5.  Emphysema (ICD10-J43.9). 6. Enlarged pulmonary arteries, indicative of pulmonary arterial hypertension. Electronically Signed   By: MLorin PicketM.D.   On: 05/08/2017 13:15    ROS:  As stated above in the HPI otherwise negative.  Blood pressure (Marland Kitchen  132/57, pulse 72, temperature 98.2 F (36.8 C), temperature source Oral, resp. rate  17, height 6' (1.829 m), weight 83.3 kg (183 lb 9.6 oz), SpO2 95 %.    PE: Gen: NAD, Alert and Oriented HEENT:  [place]/AT, EOMI Neck: Supple, no LAD Lungs: CTA Bilaterally CV: RRR without M/G/R ABM: Soft, NTND, +BS Ext: No C/C/E  Assessment/Plan: 1) Melena. 2) Heme positive stool. 3) CHF.   I believe this patient was evaluated by [facility] as Dr. Birdena Crandall note was in the chart from 2002.  Over the past couple of weeks his appetite has been low and he has not wanted to eat any foods, which is the source of his weight loss.  He denies any issues with dysphagia or GERD.  There is the report of 3 black stools, but he did use Peptobismol, however, he is heme positive.  I will pursue further evaluation with an EGD and make further recommendations pending the findings.  Plan: 1) EGD  Isyss Espinal D 05/09/2017, 12:58 PM

## 2017-05-09 NOTE — Op Note (Addendum)
[facility] Patient Name: [name] Procedure Date : 05/09/2017 MRN: [id number] Attending MD: Carol Ada , MD Date of Birth: 12/04/1932 CSN: [id number] Age: 84 Admit Type: Inpatient Procedure:                Upper GI endoscopy Indications:              Heme positive stool, Weight loss Providers:                Carol Ada, MD, Cleda Daub, RN, Elspeth Cho                            Tech., Technician Referring MD:              Medicines:                Propofol per Anesthesia Complications:            No immediate complications. Estimated Blood Loss:     Estimated blood loss: none. Procedure:                Pre-Anesthesia Assessment:                           - Prior to the procedure, a History and Physical                            was performed, and patient medications and                            allergies were reviewed. The patient's tolerance of                            previous anesthesia was also reviewed. The risks                            and benefits of the procedure and the sedation                            options and risks were discussed with the patient.                            All questions were answered, and informed consent                            was obtained. Prior Anticoagulants: The patient has                            taken no previous anticoagulant or antiplatelet                            agents. ASA Grade Assessment: III - A patient with                            severe systemic disease. After reviewing the risks  and benefits, the patient was deemed in                            satisfactory condition to undergo the procedure.                           - Sedation was administered by an anesthesia                            professional. Deep sedation was attained.                           After obtaining informed consent, the endoscope was                            passed under direct  vision. Throughout the                            procedure, the patient's blood pressure, pulse, and                            oxygen saturations were monitored continuously. The                            [id number] ([id number]) scope was introduced through the                            mouth, and advanced to the second part of duodenum.                            The upper GI endoscopy was accomplished without                            difficulty. The patient tolerated the procedure                            well. Scope In: Scope Out: Findings:      A 4 cm hiatal hernia was present.      A few non-bleeding dispersed erosions were found in the gastric body and       in the gastric antrum. There were no stigmata of recent bleeding.       Biopsies were taken with a cold forceps for histology.      The examined duodenum was normal.      The current findings do not explain the patient's weight loss. The       erosions can be a source of his heme positiviity. Impression:               - 4 cm hiatal hernia.                           - Gastric erosions without bleeding. Biopsied.                           - Normal examined duodenum. Moderate Sedation:  None Recommendation:           - Return patient to hospital ward for ongoing care.                           - Resume regular diet.                           - Continue present medications.                           - Await pathology results.                           - Obtain prior colonoscopy records, if possible. If                            the patient needs a colonoscopy, it can be                            performed as an outpatient as there is currently no                            emergent need.                           - Consider abdominal imaging for further work up of                            the sudden weight loss. Procedure Code(s):        --- Professional ---                           [id number],  Esophagogastroduodenoscopy, flexible,                            transoral; with biopsy, single or multiple Diagnosis Code(s):        --- Professional ---                           K44.9, Diaphragmatic hernia without obstruction or                            gangrene                           K25.9, Gastric ulcer, unspecified as acute or                            chronic, without hemorrhage or perforation                           R19.5, Other fecal abnormalities                           R63.4, Abnormal weight loss CPT copyright 2016 American Medical Association. All rights reserved. The codes documented in this report are preliminary  and upon coder review may  be revised to meet current compliance requirements. Carol Ada, MD Carol Ada, MD 05/09/2017 2:18:40 PM This report has been signed electronically. Number of Addenda: 0

## 2017-05-09 NOTE — Anesthesia Procedure Notes (Signed)
Procedure Name: MAC Date/Time: 05/09/2017 2:08 PM Performed by: Lieutenant Diego, CRNA Pre-anesthesia Checklist: Patient identified, Emergency Drugs available, Suction available, Patient being monitored and Timeout performed Patient Re-evaluated:Patient Re-evaluated prior to induction Oxygen Delivery Method: Simple face mask Preoxygenation: Pre-oxygenation with 100% oxygen Induction Type: IV induction

## 2017-05-09 NOTE — Transfer of Care (Signed)
Immediate Anesthesia Transfer of Care Note  Patient: [name]  Procedure(s) Performed: ESOPHAGOGASTRODUODENOSCOPY (EGD) WITH PROPOFOL (N/A )  Patient Location: PACU and Endoscopy Unit  Anesthesia Type:MAC  Level of Consciousness: sedated  Airway & Oxygen Therapy: Patient Spontanous Breathing and Patient connected to nasal cannula oxygen  Post-op Assessment: Report given to RN and Post -op Vital signs reviewed and stable  Post vital signs: Reviewed and stable  Last Vitals:  Vitals:   05/09/17 1324 05/09/17 1423  BP: (!) 164/89 (!) 118/49  Pulse: 81 66  Resp: (!) 22 (!) 21  Temp: 36.6 C 36.5 C  SpO2: 95% 93%    Last Pain:  Vitals:   05/09/17 1423  TempSrc: Oral  PainSc:       Patients Stated Pain Goal: 0 (44/69/50 7225)  Complications: No apparent anesthesia complications

## 2017-05-09 NOTE — Progress Notes (Signed)
PT Cancellation Note  Patient Details Name: [name] MRN: [id number] DOB: [birth year]   Cancelled Treatment:    Reason Eval/Treat Not Completed: Medical issues which prohibited therapy RN advises hold due to acute onset of wide QRS/HR in 140s standing at sink earlier this afternoon; plan to return when patient medically stable/able to participate in [place] [facility], [place]PT, [place]  Supplemental Physical Therapist [facility]   Pager [phone]

## 2017-05-10 ENCOUNTER — Encounter (HOSPITAL_COMMUNITY): Payer: Self-pay | Admitting: Gastroenterology

## 2017-05-10 ENCOUNTER — Encounter: Payer: Self-pay | Admitting: Cardiology

## 2017-05-10 DIAGNOSIS — N183 Chronic kidney disease, stage 3 (moderate): Secondary | ICD-10-CM | POA: Diagnosis not present

## 2017-05-10 DIAGNOSIS — R634 Abnormal weight loss: Secondary | ICD-10-CM | POA: Diagnosis not present

## 2017-05-10 DIAGNOSIS — K922 Gastrointestinal hemorrhage, unspecified: Secondary | ICD-10-CM | POA: Diagnosis not present

## 2017-05-10 DIAGNOSIS — I5042 Chronic combined systolic (congestive) and diastolic (congestive) heart failure: Secondary | ICD-10-CM | POA: Diagnosis not present

## 2017-05-10 DIAGNOSIS — I25758 Atherosclerosis of native coronary artery of transplanted heart with other forms of angina pectoris: Secondary | ICD-10-CM | POA: Diagnosis not present

## 2017-05-10 LAB — T4, FREE: FREE T4: 4.44 ng/dL — AB (ref 0.61–1.12)

## 2017-05-10 LAB — TSH

## 2017-05-10 MED ORDER — METHIMAZOLE 5 MG PO TABS
5.0000 mg | ORAL_TABLET | Freq: Two times a day (BID) | ORAL | Status: DC
  Filled 2017-05-10: qty 1

## 2017-05-10 MED ORDER — PANTOPRAZOLE SODIUM 40 MG PO TBEC: 40.0000 mg | DELAYED_RELEASE_TABLET | Freq: Every day | ORAL | 3 refills | Status: DC

## 2017-05-10 MED ORDER — MEXILETINE HCL 250 MG PO CAPS
250.0000 mg | ORAL_CAPSULE | Freq: Two times a day (BID) | ORAL | Status: DC
  Administered 2017-05-10: 250 mg via ORAL
  Filled 2017-05-10: qty 1

## 2017-05-10 MED ORDER — ISOSORBIDE MONONITRATE ER 30 MG PO TB24: 15.0000 mg | ORAL_TABLET | Freq: Every day | ORAL | Status: DC

## 2017-05-10 MED ORDER — METHIMAZOLE 5 MG PO TABS: 5.0000 mg | ORAL_TABLET | Freq: Two times a day (BID) | ORAL | 1 refills | Status: DC

## 2017-05-10 MED ORDER — PANTOPRAZOLE SODIUM 40 MG PO TBEC
40.0000 mg | DELAYED_RELEASE_TABLET | Freq: Every day | ORAL | Status: DC
  Administered 2017-05-10: 40 mg via ORAL
  Filled 2017-05-10: qty 1

## 2017-05-10 MED ORDER — TRAMADOL HCL 50 MG PO TABS: 50.0000 mg | ORAL_TABLET | Freq: Two times a day (BID) | ORAL | 0 refills | Status: DC | PRN

## 2017-05-10 MED ORDER — FUROSEMIDE 20 MG PO TABS
20.0000 mg | ORAL_TABLET | Freq: Every day | ORAL | Status: DC
  Administered 2017-05-10: 20 mg via ORAL
  Filled 2017-05-10: qty 1

## 2017-05-10 MED ORDER — HYDRALAZINE HCL 25 MG PO TABS
12.5000 mg | ORAL_TABLET | Freq: Two times a day (BID) | ORAL | Status: DC
  Administered 2017-05-10: 12.5 mg via ORAL
  Filled 2017-05-10: qty 1

## 2017-05-10 MED ORDER — CARVEDILOL 6.25 MG PO TABS
6.2500 mg | ORAL_TABLET | Freq: Two times a day (BID) | ORAL | Status: DC
  Administered 2017-05-10: 6.25 mg via ORAL
  Filled 2017-05-10 (×2): qty 1

## 2017-05-10 NOTE — Discharge Summary (Addendum)
Physician Discharge Summary   Patient ID: ATIF CHAPPLE MRN: [id number] DOB/AGE: [birth year] 84 y.o.  Admit date: 05/08/2017 Discharge date: 05/10/2017  Primary Care Physician:  [name], MD  Discharge Diagnoses:    . Persistent atrial fibrillation [facility])   Hyperthyroidism . Essential hypertension . Dyslipidemia . CKD (chronic kidney disease) stage 3, GFR 30-59 ml/min (HCC) . Chronic combined systolic and diastolic heart failure ([place]) . CAD status post CABG . Acute GI bleeding      Consults: Gastroenterology Endocrinology, Dr. Cruzita Lederer via phone consultation  Recommendations for Outpatient Follow-up:  1. Home health PT OT, RN arranged by the case management 2. Please repeat CBC/BMET at next visit 3. Please follow T3   DIET: Heart healthy diet    Allergies:   Allergies  Allergen Reactions  . Novocain [Procaine] Swelling     DISCHARGE MEDICATIONS: Allergies as of 05/10/2017      Reactions   Novocain [procaine] Swelling      Medication List    TAKE these medications   acetaminophen 500 MG tablet Commonly known as:  TYLENOL Take 500 mg by mouth every 6 (six) hours as needed for moderate pain.   albuterol 108 (90 Base) MCG/ACT inhaler Commonly known as:  PROVENTIL HFA;VENTOLIN HFA Inhale 1-2 puffs into the lungs every 6 (six) hours as needed for wheezing or shortness of breath.   atorvastatin 20 MG tablet Commonly known as:  LIPITOR TAKE 1 TABLET (20 MG TOTAL) DAILY AT 6 PM.   carvedilol 6.25 MG tablet Commonly known as:  COREG Take 6.25 mg by mouth 2 (two) times daily with a meal.   ELIQUIS 2.5 MG Tabs tablet Generic drug:  apixaban TAKE 1 TABLET TWICE DAILY   furosemide 20 MG tablet Commonly known as:  LASIX TAKE 1 TABLET (20 MG TOTAL) BY MOUTH DAILY.   hydrALAZINE 25 MG tablet Commonly known as:  APRESOLINE Take 0.5 tablets (12.5 mg total) by mouth 2 (two) times daily before a meal.   isosorbide mononitrate 30 MG 24 hr tablet Commonly  known as:  IMDUR Take 0.5 tablets (15 mg total) by mouth at bedtime.   methimazole 5 MG tablet Commonly known as:  TAPAZOLE Take 1 tablet (5 mg total) by mouth 2 (two) times daily.   mexiletine 250 MG capsule Commonly known as:  MEXITIL Take 1 capsule (250 mg total) by mouth 2 (two) times daily.   multivitamin capsule Take 1 capsule by mouth daily.   omega-3 acid ethyl esters 1 g capsule Commonly known as:  LOVAZA Take 2 g by mouth 2 (two) times daily.   pantoprazole 40 MG tablet Commonly known as:  PROTONIX Take 1 tablet (40 mg total) by mouth daily before breakfast.   ranitidine 300 MG tablet Commonly known as:  ZANTAC Take 300 mg by mouth at bedtime.   traMADol 50 MG tablet Commonly known as:  ULTRAM Take 1 tablet (50 mg total) by mouth every 12 (twelve) hours as needed for moderate pain or severe pain. What changed:    when to take this  reasons to take this        Brief H and P: For complete details please refer to admission H and P, but in brief Patient is a 84 year old male with CAD with h/o CABG, CHF, severe AoS - s/p replacement with tissue valve, s/p AICD for VT (recently amiodarone was discontinued due to SOB and started on mexiletine), CM with EF 25-30%, AF - on Eliquis, orthostatic hypotension, COPD, DM type II,  HLD who presented to the ED with complains profound weakness and inability to perform ADLs without assistance, poor appetite with early satiety, nausea and weight loss that has been going on for almost 2 weeks. Patient reported weight loss of nearly 20 pounds within that time and since a day before the admission, he had 3 bouts of melena. Patient was seen on the day of admission by his cardiologist, Dr. Haroldine Laws who did not feel like his symptoms were related to CHF exacerbation and referred the patient to ED.      Hospital Course:  GI bleed in the setting of anticoagulation, Eliquis -Eliquis was held FOBT positive, reported black stools for last  several days. -Per patient EGD several years ago and had history of peptic ulcers.  GI was consulted. -Patient was started on IV Protonix - EGD done  - 4cm hiatal hernia, few non bleeding erosions in gastric body and antrum. Biopsies done.  -Eliquis can be restarted outpatient and follow-up with cardiology   Weight loss, failure to thrive likely due to hyperthyroidism - Weight 209 lbs on 10/22, 196 on 1/14, 184 at admission.  Patient also reported loss of appetite and failure to thrive, night sweats -  CT chest showed bilateral pleural effusions, associated compressive atelectasis, 6 mm subpleural left upper lobe nodule appears new, recommended short-term follow-up in 3 months with CT chest.  Large pulmonary arteries indicative of pulmonary arterial hypertension. -Was negative for any mass or tumor.  CT abdomen pelvis was negative for any malignancy. -TSH less than 0.01, suggesting hyperthyroidism, T4 4.4, T3, will need to be followed -Discussed with endocrinology, Dr.Cristina Gherge, recommended starting low-dose methimazole 5 mg twice daily and will follow  outpatient in 1-2 weeks.  Patient will be contacted for a follow-up appointment - Hyperthyroidism possibly amiodarone related.  Amiodarone was discontinued outpatient on 04/18/17 by EP cardiology, Dr Tomasa Blase    Essential hypertension - currently stable  CHF, chronic systolic and diastolic, CAD status post CABG/AVR 2009 -2D echo 5/18 had shown EF of 25% with diffuse hypokinesis, grade 1 diastolic dysfunction, bioprosthetic aortic valve -Per Dr. Lavonia Drafts, euvolemic or somewhat dry, may represent end-stage CHF.  No ACE/ARB are due to CKD  History of recent VT -Patient had been seen by EP, Dr. Curt Bears on 04/18/2017 and patient was switched to mexiletine due to shortness of breath, amiodarone was discontinued  History of lung Nodules - CT chest showed bilateral pleural effusions, associated compressive atelectasis, 6 mm subpleural  left upper lobe nodule appears new, recommended short-term follow-up in 3 months with CT chest.  Large pulmonary arteries indicative of pulmonary arterial hypertension.    Persistent atrial fibrillation ([place]) -Restarted beta-blocker, may restart Eliquis outpatient follow-up with cardiology at discharge to decide if patient will continue anticoagulation    CKD (chronic kidney disease) stage 3, GFR 30-59 ml/min (HCC) -Baseline creatinine 1.3-1.5, currently creatinine at baseline  Moderate malnutrtion - dietician consulted.     Day of Discharge BP (!) 121/54 (BP Location: Left Arm)   Pulse 78   Temp 97.6 F (36.4 C) (Oral)   Resp 18   Ht 6' (1.829 m)   Wt 84.2 kg (185 lb 11.2 oz)   SpO2 98%   BMI 25.19 kg/m   Physical Exam: General: Alert and awake oriented x3 not in any acute distress. HEENT: anicteric sclera, pupils reactive to light and accommodation CVS: S1-S2 clear no murmur rubs or gallops Chest: clear to auscultation bilaterally, no wheezing rales or rhonchi Abdomen: soft nontender, nondistended, normal  bowel sounds Extremities: no cyanosis, clubbing or edema noted bilaterally Neuro: Cranial nerves II-XII intact, no focal neurological deficits   The results of significant diagnostics from this hospitalization (including imaging, microbiology, ancillary and laboratory) are listed below for reference.    LAB RESULTS: Basic Metabolic Panel: Recent Labs  Lab 05/08/17 1048 05/09/17 0012 05/09/17 1835  NA 140 140  --   K 4.1 4.7 4.2  CL 108 109  --   CO2 21* 20*  --   GLUCOSE 131* 126*  --   BUN 28* 29*  --   CREATININE 1.55* 1.54*  --   CALCIUM 8.8* 8.7*  --   MG  --   --  2.2   Liver Function Tests: Recent Labs  Lab 05/08/17 1048  AST 34  ALT 38  ALKPHOS 60  BILITOT 0.9  PROT 5.9*  ALBUMIN 3.0*   No results for input(s): LIPASE, AMYLASE in the last 168 hours. No results for input(s): AMMONIA in the last 168 hours. CBC: Recent Labs  Lab  05/08/17 1048  05/09/17 1237 05/09/17 1835  WBC 8.5   < > 9.0 8.6  NEUTROABS 5.3  --   --   --   HGB 13.1   < > 14.3 13.1  HCT 40.6   < > 42.3 40.9  MCV 87.5   < > 88.3 88.0  PLT 270   < > 267 254   < > = values in this interval not displayed.   Cardiac Enzymes: No results for input(s): CKTOTAL, CKMB, CKMBINDEX, TROPONINI in the last 168 hours. BNP: Invalid input(s): POCBNP CBG: No results for input(s): GLUCAP in the last 168 hours.  Significant Diagnostic Studies:  Ct Abdomen Pelvis Wo Contrast  Result Date: 05/09/2017 CLINICAL DATA:  Weight loss unintentional with generalized abdominal pain. EXAM: CT ABDOMEN AND PELVIS WITHOUT CONTRAST TECHNIQUE: Multidetector CT imaging of the abdomen and pelvis was performed following the standard protocol without IV contrast. COMPARISON:  08/17/2013 and MRI 09/07/2013 FINDINGS: Lower chest: Mild emphysematous disease in the lung bases. Moderate size right effusion and smaller left effusion with associated bibasilar atelectasis. Cardiac pacer leads over the heart. Mild calcification of the mitral valve annulus. Hepatobiliary: Contrast within the gallbladder from recent chest CT. Liver and biliary tree are within normal. Pancreas: Normal. Spleen: Normal. Adrenals/Urinary Tract: Adrenal glands are normal. Kidneys are normal in size without hydronephrosis or nephrolithiasis. Ureters are within normal. Contrast fills the bladder. Stomach/Bowel: Stomach and small bowel are within normal. Appendix is normal. There is moderate diverticulosis of the colon most prominent over the sigmoid colon. No active inflammation. Vascular/Lymphatic: Moderate calcified plaque over the abdominal aorta and iliac arteries. No aneurysm. No adenopathy. Reproductive: Within normal. Other: No free fluid or focal inflammatory change. Musculoskeletal: Degenerate change of the spine and mild degenerate change of the hips. IMPRESSION: No acute findings in the abdomen/pelvis. No evidence of  mass or adenopathy. Moderate size right pleural effusion and small left pleural effusion with associated bibasilar atelectasis. Colonic diverticulosis. Aortic Atherosclerosis (ICD10-I70.0). Electronically Signed   By: Marin Olp M.D.   On: 05/09/2017 19:40   Dg Chest 2 View  Result Date: 05/08/2017 CLINICAL DATA:  Generalized weakness. Shortness of breath on exertion. Decreased P.O. intake for 3 weeks. EXAM: CHEST  2 VIEW COMPARISON:  CT chest 03/05/2017.  PA and lateral chest 02/25/2017. FINDINGS: There is cardiomegaly without edema. The patient is status post CABG with a pacing device in place. Small bilateral pleural effusions and basilar airspace disease  are greater on the left and increased slightly since the prior plain films. No pneumothorax. IMPRESSION: Small right greater than left pleural effusions and basilar airspace disease have increased since the prior plain films. Cardiomegaly without edema. Electronically Signed   By: Inge Rise M.D.   On: 05/08/2017 11:51   Ct Angio Chest Pe W And/or Wo Contrast  Result Date: 05/08/2017 CLINICAL DATA:  Shortness of breath and generalized weakness. EXAM: CT ANGIOGRAPHY CHEST WITH CONTRAST TECHNIQUE: Multidetector CT imaging of the chest was performed using the standard protocol during bolus administration of intravenous contrast. Multiplanar CT image reconstructions and MIPs were obtained to evaluate the vascular anatomy. CONTRAST:  86m ISOVUE-370 IOPAMIDOL (ISOVUE-370) INJECTION 76% COMPARISON:  03/05/2017. FINDINGS: Cardiovascular: Negative for pulmonary embolus. Atherosclerotic calcification of the arterial vasculature. Pulmonary arteries and heart are enlarged. No pericardial effusion. Mediastinum/Nodes: Mediastinal and right hilar lymph nodes are not enlarged by CT size criteria. No axillary adenopathy. Esophagus is grossly unremarkable. Lungs/Pleura: Image quality is degraded by respiratory motion. Moderate right pleural effusion with  compressive atelectasis in the right lower lobe. Small left pleural effusion with minimal compressive atelectasis in the left lower lobe. 6 mm anterior left upper lobe nodule (series 6, image 69), appears new. Additional scattered pulmonary nodules measure 4 mm or less in size and are stable. Airway is unremarkable. Upper Abdomen: Visualized portions of the liver, gallbladder, adrenal glands, kidneys, spleen, pancreas, stomach and bowel are grossly unremarkable. No upper abdominal adenopathy. Musculoskeletal: Degenerative changes in the spine. No worrisome lytic or sclerotic lesions. Review of the MIP images confirms the above findings. IMPRESSION: 1. Bilateral pleural effusions and associated compressive atelectasis, right greater than left. 2. 6 mm subpleural left upper lobe nodule appears new from 03/05/2017. Consider short-term follow-up in 3 months with CT chest without contrast, as clinically indicated. 3. Additional scattered pulmonary nodules are stable. 4.  Aortic atherosclerosis (ICD10-170.0). 5.  Emphysema (ICD10-J43.9). 6. Enlarged pulmonary arteries, indicative of pulmonary arterial hypertension. Electronically Signed   By: MLorin PicketM.D.   On: 05/08/2017 13:15    2D ECHO:   Disposition and Follow-up: Discharge Instructions    Diet - low sodium heart healthy   Complete by:  As directed    Increase activity slowly   Complete by:  As directed        DISPOSITION: home    [place]   HCarol Ada MD. Schedule an appointment as soon as possible for a visit on 05/27/2017.   Specialty:  Gastroenterology Why:  2:45 pm Please arrive 15 minute early. Contact information: [id number] [phone]        MOrpah Melter MD. Schedule an appointment as soon as possible for a visit on 05/24/2017.   Specialty:  Family Medicine Why:  12pm. Please arrive 15 minutes early Contact information: [phone] [phone]        TSueanne Margarita MD. Go on 05/27/2017.   Specialty:  Cardiology Why:  [place] PA at 8am. Please arrive 15 minutes early. Thank you Contact information: [address] [place] [id number].   Specialty:  Home Health Services Why:  Physical Therapy Contact information: [phone]       GPhilemon Kingdom MD. Schedule an appointment as soon as possible for a visit in 2 week(s).  Specialty:  Internal Medicine Why:  office will call you with appointment. Please call on Wednesday if you haven't received any call for appt.  Contact information: [address] [place] [address] [phone]            Time spent on Discharge: 55mns   Signed:   REstill CottaM.D. [facility] 05/10/2017, 5:14 PM Pager: [phone]

## 2017-05-10 NOTE — Consult Note (Signed)
[facility] CM Primary Care Navigator  05/10/2017  [name] September 12, 1932 [id number]   Met with patient at the bedsideto identify possible discharge needs. Patientreports "weakness and bleeding from rectum"that had led to this admission.  Patientendorses Dr. [name] with [place] [facility] as [place] care provider.   [facility] [place] [facility]eto obtain medications without difficulty.   Patient reportsmanaginghis ownmedications at home with use of "pill box" system filled weekly.  Patient statesthathe was driving prior to admission but his children (2 are living nearby) will be able to providetransportation to hisdoctors'appointments after discharge.  [employer] transportation benefits discussed with patient as well.  Patient lives at home with wife [name]) who will be the primary caregiver after discharge.  Anticipated discharge plan is home according to patient.  Patientvoiced understanding to call primary care provider's officewhen hereturnshome,for a post discharge follow-up within 1-2 weeks or sooner if needs arise.Patient letter (with PCP's contact number) was provided as areminder.   Discussed with patient regarding [facility] CM services available for health management at home but he "feels not necessary since not having any problems right now".He states that he is "okay" managing himself at home (HF- as instructed by [name]. with [facility]). He verbalized doing regular monitoring ofweight (encouraged recording results taken); diet restrictions; exercise; taking medications andclose follow-up with providers when needed.  Patienthowever,opted and verbally agreed for EMMI calls to follow-up with his recovery at home.   Referral [place] calls after discharge.  Patient voiced understandingto seekreferral from primary care provider to [facility]  care management if deemednecessaryand appropriate for further services in the future.  [facility] care management information was provided for future needs that he may have   For additional questions please contact:  Edwena Felty A. Dorwin Fitzhenry, BSN, RN-BC [facility] PRIMARY CARE Navigator Cell: [phone]

## 2017-05-10 NOTE — Care Management Obs Status (Signed)
[place] NOTIFICATION   Patient Details  Name: [name] MRN: [id number] Date of Birth: [birth year]   Medicare Observation Status Notification Given:  Yes    Carles Collet, RN 05/10/2017, 8:52 AM

## 2017-05-10 NOTE — Care Management CC44 (Signed)
Condition Code 44 Documentation Completed  Patient Details  Name: [name] MRN: [id number] Date of Birth: 06-02-1932   Condition Code 44 given:  Yes Patient signature on Condition Code 44 notice:  Yes Documentation of 2 MD's agreement:  Yes Code 44 added to claim:  Yes    Carles Collet, RN 05/10/2017, 8:52 AM

## 2017-05-10 NOTE — Progress Notes (Signed)
[facility] Gastroenterology Progress Note  JACY HOWAT 84 y.o. Nov 22, 1932   Subjective: Feels ok. Denies abdominal pain.   Objective: Vital signs: Vitals:   05/10/17 0532 05/10/17 0855  BP: (!) 154/67 (!) 148/52  Pulse: 73 81  Resp: 19 20  Temp: 97.6 F (36.4 C) (!) 97.4 F (36.3 C)  SpO2: 95% 98%    Physical Exam: Gen: lethargic, elderly, no acute distress, well-nourished HEENT: anicteric sclera CV: RRR Chest: CTA B Abd: epigastric tenderness with guarding, soft, nondistended, +BS Ext: no edema  Lab Results: Recent Labs    05/08/17 1048 05/09/17 0012 05/09/17 1835  NA 140 140  --   K 4.1 4.7 4.2  CL 108 109  --   CO2 21* 20*  --   GLUCOSE 131* 126*  --   BUN 28* 29*  --   CREATININE 1.55* 1.54*  --   CALCIUM 8.8* 8.7*  --   MG  --   --  2.2   Recent Labs    05/08/17 1048  AST 34  ALT 38  ALKPHOS 60  BILITOT 0.9  PROT 5.9*  ALBUMIN 3.0*   Recent Labs    05/08/17 1048  05/09/17 1237 05/09/17 1835  WBC 8.5   < > 9.0 8.6  NEUTROABS 5.3  --   --   --   HGB 13.1   < > 14.3 13.1  HCT 40.6   < > 42.3 40.9  MCV 87.5   < > 88.3 88.0  PLT 270   < > 267 254   < > = values in this interval not displayed.      Assessment/Plan: Weight loss and heme positive stool with unrevealing EGD for source of weight loss. Black stools likely related to recent Pepto-Bismol. Not anemic and no signs of GI bleeding. Needs updated TSH check (ordering this morning) due to weight loss. Last colonoscopy in 04/2011 showed left-sided diverticulosis and medium-sized internal hemorrhoids. Will plan to do an outpt colonoscopy in the near future. Noncontrast CT unrevealing for a source of the weight loss. F/U with me in 3-4 weeks to arrange colonoscopy. Ok to go home from GI standpoint pending TSH. Will sign off. Call if questions.   [place] C. 05/10/2017, 11:04 AM  Pager [phone]  AFTER 5 PM or on weekends please call [phone] ID: [name], male    DOB: 1933-03-05, 84 y.o.   MRN: [id number]

## 2017-05-10 NOTE — Evaluation (Signed)
Physical Therapy Evaluation Patient Details Name: [name] MRN: [id number] DOB: [birth year] Today's Date: 05/10/2017   History of Present Illness  Patient is a 84 year old male with CAD with CABG, CHF, severe aortic stenosis - s/p replacement with tissue valve, s/p AICD for VT (recently amiodarone was discontinued due to SOB and started on mexiletine), CM with EF 25-30%, AF - on Eliquis, orthostatic hypotension, COPD, DM type II, who presented to the ED with complaints of profound weakness and inability to perform ADLs without assistance, poor appetite with early satiety, nausea and weight loss that has been going on for almost 2 weeks.  Clinical Impression  Pt is not at baseline functioning, but should be safe at home with some help from his children. There are no further acute PT needs, but pt can benefit from [facility].  Will sign off at this time.      Follow Up Recommendations Home health PT;Supervision/Assistance - 24 hour    Equipment Recommendations  None recommended by PT    Recommendations for Other Services       Precautions / Restrictions Precautions Precautions: Fall      Mobility  Bed Mobility               General bed mobility comments: up OOB on arrival.  At EOB on departure.  Transfers Overall transfer level: Needs assistance   Transfers: Sit to/from Stand Sit to Stand: Modified independent (Device/Increase time)         General transfer comment: slow and mildly effortful the lower the surface  Ambulation/Gait Ambulation/Gait assistance: Supervision Ambulation Distance (Feet): 300 Feet Assistive device: None Gait Pattern/deviations: Step-through pattern   Gait velocity interpretation: Below normal speed for age/gender General Gait Details: slow and guarded gait, but generally steady.  sats on TA during ambulation stayed around 93% at 93 bpm.  Down from 96% at 80 bpm at rest on RA  Stairs Stairs: Yes Stairs assistance: Min assist Stair  Management: One rail Right;Step to pattern;Forwards Number of Stairs: 2 General stair comments: needs assist, have reinforced using his ramp.  Wheelchair Mobility    Modified Rankin (Stroke Patients Only)       Balance Overall balance assessment: Needs assistance   Sitting balance-Leahy Scale: Normal       Standing balance-Leahy Scale: Good                               Pertinent Vitals/Pain      Home Living Family/patient expects to be discharged to:: Private residence Living Arrangements: Spouse/significant other Available Help at Discharge: Family;Available 24 hours/day;Other (Comment)(wife is frail at this time.  dtr give PRN assist) Type of Home: House Home Access: Ramped entrance;Stairs to enter   Entrance Stairs-Number of Steps: 1 Home Layout: One level Home Equipment: Cane - quad;Bedside commode;Grab bars - toilet;Grab bars - tub/shower      Prior Function Level of Independence: Independent         Comments: drives, caregiver for his wife more often     Hand Dominance        Extremity/Trunk Assessment        Lower Extremity Assessment Lower Extremity Assessment: Generalized weakness       Communication   Communication: No difficulties  Cognition Arousal/Alertness: Awake/alert Behavior During Therapy: WFL for tasks assessed/performed Overall Cognitive Status: Within Functional Limits for tasks assessed  General Comments      Exercises     Assessment/Plan    PT Assessment All further PT needs can be met in the next venue of care  PT Problem List Decreased strength;Decreased activity tolerance;Decreased mobility;Cardiopulmonary status limiting activity       PT Treatment Interventions      PT Goals (Current goals can be found in the Care Plan section)  Acute Rehab PT Goals PT Goal Formulation: All assessment and education complete, DC therapy    Frequency      Barriers to discharge        Co-evaluation               AM-PAC PT "6 Clicks" Daily Activity  Outcome Measure Difficulty turning over in bed (including adjusting bedclothes, sheets and blankets)?: None Difficulty moving from lying on back to sitting on the side of the bed? : None Difficulty sitting down on and standing up from a chair with arms (e.g., wheelchair, bedside commode, etc,.)?: None Help needed moving to and from a bed to chair (including a wheelchair)?: A Little Help needed walking in hospital room?: A Little Help needed climbing 3-5 steps with a railing? : A Little 6 Click Score: 21    End of Session   Activity Tolerance: Patient tolerated treatment well Patient left: in bed;Other (comment);with call bell/phone within reach(sitting EOB) Nurse Communication: Mobility status PT Visit Diagnosis: Other abnormalities of gait and mobility (R26.89);Muscle weakness (generalized) (M62.81)    Time: 0459-9774 PT Time Calculation (min) (ACUTE ONLY): 21 min   Charges:   PT Evaluation $PT Eval Moderate Complexity: 1 Mod     PT G Codes:        05-29-17  Donnella Sham, PT [phone]  (pager)  Tessie Fass Marlaya Turck 05/29/17, 1:05 PM

## 2017-05-10 NOTE — Care Management Note (Signed)
Case Management Note  Patient Details  Name: JMARI PELC MRN: [id number] Date of Birth: 11-Apr-1932  Subjective/Objective: Pt presented for fatigue and SOB- HGB 13 on admission- post upper endo. PTA from home with family support. Plan for home with [facility]. Agency List provided and pt chose [facility].                     Action/Plan: CM did make referral to liaison Dan with [facility] to begin within 24-48 hours post d/c. No further needs from CM at this time.   Expected Discharge Date:  05/10/17               Expected Discharge Plan:  [place]  In-House Referral:  NA  Discharge planning Services  CM Consult  Post Acute Care Choice:  Home Health Choice offered to:  Patient  DME Arranged:  N/A DME Agency:  NA  HH Arranged:  PT [place] Agency:  [place]  Status of Service:  Completed, signed off  If discussed at [place] of Stay Meetings, dates discussed:    Additional Comments:  Bethena Roys, RN 05/10/2017, 1:24 PM

## 2017-05-11 LAB — T3: T3, Total: 134 ng/dL (ref 71–180)

## 2017-05-13 ENCOUNTER — Ambulatory Visit (INDEPENDENT_AMBULATORY_CARE_PROVIDER_SITE_OTHER): Payer: Medicare HMO | Admitting: *Deleted

## 2017-05-13 DIAGNOSIS — I481 Persistent atrial fibrillation: Secondary | ICD-10-CM | POA: Diagnosis not present

## 2017-05-13 DIAGNOSIS — I130 Hypertensive heart and chronic kidney disease with heart failure and stage 1 through stage 4 chronic kidney disease, or unspecified chronic kidney disease: Secondary | ICD-10-CM | POA: Diagnosis not present

## 2017-05-13 DIAGNOSIS — E0590 Thyrotoxicosis, unspecified without thyrotoxic crisis or storm: Secondary | ICD-10-CM | POA: Diagnosis not present

## 2017-05-13 DIAGNOSIS — I429 Cardiomyopathy, unspecified: Secondary | ICD-10-CM | POA: Diagnosis not present

## 2017-05-13 DIAGNOSIS — E440 Moderate protein-calorie malnutrition: Secondary | ICD-10-CM | POA: Diagnosis not present

## 2017-05-13 DIAGNOSIS — I2510 Atherosclerotic heart disease of native coronary artery without angina pectoris: Secondary | ICD-10-CM | POA: Diagnosis not present

## 2017-05-13 DIAGNOSIS — K259 Gastric ulcer, unspecified as acute or chronic, without hemorrhage or perforation: Secondary | ICD-10-CM | POA: Diagnosis not present

## 2017-05-13 DIAGNOSIS — I255 Ischemic cardiomyopathy: Secondary | ICD-10-CM

## 2017-05-13 DIAGNOSIS — I5042 Chronic combined systolic (congestive) and diastolic (congestive) heart failure: Secondary | ICD-10-CM | POA: Diagnosis not present

## 2017-05-13 DIAGNOSIS — N183 Chronic kidney disease, stage 3 (moderate): Secondary | ICD-10-CM | POA: Diagnosis not present

## 2017-05-13 NOTE — Progress Notes (Signed)
Remote ICD transmission.   

## 2017-05-14 ENCOUNTER — Encounter: Payer: Self-pay | Admitting: Cardiology

## 2017-05-14 ENCOUNTER — Telehealth: Payer: Self-pay | Admitting: Cardiology

## 2017-05-14 ENCOUNTER — Encounter: Payer: Self-pay | Admitting: Internal Medicine

## 2017-05-14 ENCOUNTER — Ambulatory Visit: Payer: Medicare HMO | Admitting: Internal Medicine

## 2017-05-14 VITALS — BP 146/72 | HR 80 | Ht 72.0 in | Wt 190.0 lb

## 2017-05-14 DIAGNOSIS — T462X5A Adverse effect of other antidysrhythmic drugs, initial encounter: Secondary | ICD-10-CM | POA: Diagnosis not present

## 2017-05-14 DIAGNOSIS — E064 Drug-induced thyroiditis: Secondary | ICD-10-CM

## 2017-05-14 LAB — T3, FREE: T3 FREE: 5.8 pg/mL — AB (ref 2.3–4.2)

## 2017-05-14 LAB — T4, FREE: FREE T4: 4.59 ng/dL — AB (ref 0.60–1.60)

## 2017-05-14 LAB — TSH

## 2017-05-14 NOTE — Telephone Encounter (Signed)
[name] from [facility] was calling for confirmation for home health orders. Per Dr. Radford Pax okay to give verbal for home health orders. She verbalized understanding and thanked me for the call.

## 2017-05-14 NOTE — Patient Instructions (Addendum)
Please stop at the lab.  Come back for labs in 5 weeks.  Please continue Methimazole 5 mg 2x a day with meals.  We will schedule a thyroid U/S for you.  Please return in 3 months for a visit with me.   Hyperthyroidism Hyperthyroidism is when the thyroid is too active (overactive). Your thyroid is a large gland that is located in your neck. The thyroid helps to control how your body uses food (metabolism). When your thyroid is overactive, it produces too much of a hormone called thyroxine. What are the causes? Causes of hyperthyroidism may include:  Graves disease. This is when your immune system attacks the thyroid gland. This is the most common cause.  Inflammation of the thyroid gland.  Tumor in the thyroid gland or somewhere else.  Excessive use of thyroid medicines, including: ? Prescription thyroid supplement. ? Herbal supplements that mimic thyroid hormones.  Solid or fluid-filled lumps within your thyroid gland (thyroid nodules).  Excessive ingestion of iodine.  What increases the risk?  Being male.  Having a family history of thyroid conditions. What are the signs or symptoms? Signs and symptoms of hyperthyroidism may include:  Nervousness.  Inability to tolerate heat.  Unexplained weight loss.  Diarrhea.  Change in the texture of hair or skin.  Heart skipping beats or making extra beats.  Rapid heart rate.  Loss of menstruation.  Shaky hands.  Fatigue.  Restlessness.  Increased appetite.  Sleep problems.  Enlarged thyroid gland or nodules.  How is this diagnosed? Diagnosis of hyperthyroidism may include:  Medical history and physical exam.  Blood tests.  Ultrasound tests.  How is this treated? Treatment may include:  Medicines to control your thyroid.  Surgery to remove your thyroid.  Radiation therapy.  Follow these instructions at home:  Take medicines only as directed by your health care provider.  Do not use any  tobacco products, including cigarettes, chewing tobacco, or electronic cigarettes. If you need help quitting, ask your health care provider.  Do not exercise or do physical activity until your health care provider approves.  Keep all follow-up appointments as directed by your health care provider. This is important. Contact a health care provider if:  Your symptoms do not get better with treatment.  You have fever.  You are taking thyroid replacement medicine and you: ? Have depression. ? Feel mentally and physically slow. ? Have weight gain. Get help right away if:  You have decreased alertness or a change in your awareness.  You have abdominal pain.  You feel dizzy.  You have a rapid heartbeat.  You have an irregular heartbeat. This information is not intended to replace advice given to you by your health care provider. Make sure you discuss any questions you have with your health care provider. Document Released: 03/26/2005 Document Revised: 08/25/2015 Document Reviewed: 08/11/2013 Elsevier Interactive Patient Education  2018 [employer].

## 2017-05-14 NOTE — Progress Notes (Signed)
Patient ID: [name], male   DOB: 13-May-1932, 84 y.o.   MRN: [id number]    HPI  [name] is a 84 y.o.-year-old male, referred by Dr. 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  Patient has a history of CHF secondary to ischemic cardiomyopathy and atrial fibrillation. He was started on Amiodarone 08/2014 >> he was taken off 1 mo ago 2/2 suspected amiodarone-induced lung ds.  He was started on Mexitil instead.  On 05/08/2017, he saw his cardiologist and complained of weakness, shortness of breath, weight loss was directed to the emergency department.  TFTs were checked and they were thyrotoxic.  I was contacted over the phone by Dr. Tana Coast adjusted to start methimazole 5 mg twice a day. He was discharged on 05/10/2017.  He is feeling better now and gained 1-1/2 pounds after discharge.  I reviewed pt's thyroid tests: Lab Results  Component Value Date   TSH <0.010 (L) 05/10/2017   TSH 1.630 01/28/2017   TSH 1.920 07/30/2016   TSH 1.27 07/28/2015   TSH 1.654 01/19/2015   TSH 1.85 10/14/2014   TSH 1.85 09/13/2014   TSH 1.95 09/07/2014   TSH 1.055 08/23/2014   Lab Results  Component Value Date   FREET4 4.44 (H) 05/10/2017   No results found for: T3FREE   05/10/2017: total T3:  134 (71-180)   Pt denies feeling nodules in neck, dysphagia/odynophagia, but has SOB with lying down, + hoarseness.   He c/o: - + weight loss: 20 lbs in 2 weeks >> stopped after discharge.  - + fatigue - + excessive sweating/heat intolerance - better after discharge - He denies tremors, but these are evident on exam - no anxiety - no insomnia - no palpitations - no hyperdefecation - no hair loss  Pt does not have a FH of thyroid ds. No FH of thyroid cancer. No h/o radiation tx to head or neck.  No seaweed or kelp, + recent contrast studies (CTA chest 05/08/2017). No steroid use. No herbal supplements. No Biotin use.  Pt. also has a history of A fib for >10 years, h/o Ao valve  replacement, HL, GERD.  ROS: Constitutional: + see HPI Eyes: no blurry vision, no xerophthalmia ENT: no sore throat, + see HPI, + hypoacusis, + tinnitus Cardiovascular: no CP/+ SOB/no palpitations/leg swelling Respiratory: +cough/+ SOB Gastrointestinal: + N/no V/D/C Musculoskeletal: no muscle/joint aches Skin: no rashes Neurological: no tremors/numbness/tingling/dizziness Psychiatric: no depression/anxiety  Past Medical History:  Diagnosis Date  . AICD (automatic cardioverter/defibrillator) present   . Aortic valve disorder 2009   severe AS s/p AVR with pericardial tissue valve  . Arthritis    "minor everywhere" (04/22/2015)  . Asthma    "a touch"  . Barrett esophagus   . CAD (coronary artery disease)    s/p 2 vessel CABG with LIMA to LAD, SVG to diagonal 1, SVG to RCA 12/09  . Cardiomyopathy ([place])    EF 15-20% by echo 2017  . Chronic combined systolic and diastolic CHF (congestive heart failure) (HCC)    dry weight 197-200lbs.  . CKD (chronic kidney disease)   . Diabetes ([place])    "I'm prediabetic" (04/22/2015)  . GERD (gastroesophageal reflux disease)   . H/O: rheumatic fever   . Hepatitis 1957   "don't know what kind:  . History of hiatal hernia   . History of PFTs    a. Amiodarone started 5/16 >> PFTs w/ DLCO 6/16:  FEV1 72% predicted, FEV1/FVC 66%, DLCO 66% >> minimal reversible obstructive airways  disease with mild diffusion defect (suggestive of emphysema but absence of hyperinflation inconsistent with dx)  . HTN (hypertension)   . Hypercholesteremia   . Orthostatic hypotension   . Persistent atrial fibrillation ([place])    post op afib with no reoccurence  . Pneumonia 08/2014   "dr thought I may have had a touch"   Past Surgical History:  Procedure Laterality Date  . AORTIC VALVE REPLACEMENT  with 23-mm Magna Ease pericardial valve, model number   with 23-mm Magna Ease pericardial valve, model [id number], serial number [id number]   . APPENDECTOMY  1940s  .  BI-VENTRICULAR IMPLANTABLE CARDIOVERTER DEFIBRILLATOR  (CRT-D)  04/22/2015  . CARDIAC CATHETERIZATION N/A 11/04/2014   Procedure: Left Heart Cath and Cors/Grafts Angiography;  Surgeon: Leonie Man, MD;  Location: [facility];  Service: Cardiovascular;  Laterality: N/A;  . CARDIAC VALVE REPLACEMENT    . CARDIOVERSION N/A 01/27/2013   Procedure: CARDIOVERSION;  Surgeon: Sueanne Margarita, MD;  Location: [facility];  Service: Cardiovascular;  Laterality: N/A;  . CARDIOVERSION N/A 08/25/2014   Procedure: CARDIOVERSION;  Surgeon: Sanda Klein, MD;  Location: [facility];  Service: Cardiovascular;  Laterality: N/A;  . CATARACT EXTRACTION W/ INTRAOCULAR LENS  IMPLANT, BILATERAL Bilateral   . CORONARY ARTERY BYPASS GRAFT  03/10/2008   x 3 Dr. Roxan Hockey  . EP IMPLANTABLE DEVICE N/A 04/22/2015   Procedure: BiV ICD Insertion CRT-D;  Surgeon: [name], MD;  Location: [facility];  Service: Cardiovascular;  Laterality: N/A;  . ESOPHAGOGASTRODUODENOSCOPY (EGD) WITH ESOPHAGEAL DILATION  X1  . ESOPHAGOGASTRODUODENOSCOPY (EGD) WITH PROPOFOL N/A 05/09/2017   Procedure: ESOPHAGOGASTRODUODENOSCOPY (EGD) WITH PROPOFOL;  Surgeon: Carol Ada, MD;  Location: [facility];  Service: Endoscopy;  Laterality: N/A;  . TONSILLECTOMY     Social History   Tobacco Use  . Smoking status: Never Smoker  . Smokeless tobacco: Never Used  Substance and Sexual Activity  . Alcohol use: Yes    Comment: 04/22/2015 "nothing since the 1980s; never had a problem w/it"  . Drug use: No  . Sexual activity: No  Other Topics Concern  . Not on file  Social History Narrative   Married, lives with spouse [name]   4 children, 2 boys and 2 girls > one daughter passed   OCCUPATION: retired, Pension scheme manager for [employer]   Current Outpatient Medications on File Prior to Visit  Medication Sig Dispense Refill  . acetaminophen (TYLENOL) 500 MG tablet Take 500 mg by mouth every 6 (six) hours as  needed for moderate pain.    Marland Kitchen albuterol (PROVENTIL HFA;VENTOLIN HFA) 108 (90 Base) MCG/ACT inhaler Inhale 1-2 puffs into the lungs every 6 (six) hours as needed for wheezing or shortness of breath.    Marland Kitchen atorvastatin (LIPITOR) 20 MG tablet TAKE 1 TABLET (20 MG TOTAL) DAILY AT 6 PM. 90 tablet 2  . carvedilol (COREG) 6.25 MG tablet Take 6.25 mg by mouth 2 (two) times daily with a meal.    . ELIQUIS 2.5 MG TABS tablet TAKE 1 TABLET TWICE DAILY 180 tablet 2  . furosemide (LASIX) 20 MG tablet TAKE 1 TABLET (20 MG TOTAL) BY MOUTH DAILY. 90 tablet 2  . hydrALAZINE (APRESOLINE) 25 MG tablet Take 0.5 tablets (12.5 mg total) by mouth 2 (two) times daily before a meal. 90 tablet 3  . isosorbide mononitrate (IMDUR) 30 MG 24 hr tablet Take 0.5 tablets (15 mg total) by mouth at bedtime. 90 tablet 2  . methimazole (TAPAZOLE) 5 MG tablet Take 1  tablet (5 mg total) by mouth 2 (two) times daily. 60 tablet 1  . mexiletine (MEXITIL) 250 MG capsule Take 1 capsule (250 mg total) by mouth 2 (two) times daily. 60 capsule 3  . Multiple Vitamin (MULTIVITAMIN) capsule Take 1 capsule by mouth daily.    Marland Kitchen omega-3 acid ethyl esters (LOVAZA) 1 G capsule Take 2 g by mouth 2 (two) times daily.    . pantoprazole (PROTONIX) 40 MG tablet Take 1 tablet (40 mg total) by mouth daily before breakfast. 30 tablet 3  . ranitidine (ZANTAC) 300 MG tablet Take 300 mg by mouth at bedtime.    . traMADol (ULTRAM) 50 MG tablet Take 1 tablet (50 mg total) by mouth every 12 (twelve) hours as needed for moderate pain or severe pain. 20 tablet 0   No current facility-administered medications on file prior to visit.    Allergies  Allergen Reactions  . Novocain [Procaine] Swelling   Family History  Problem Relation Age of Onset  . Alzheimer's disease Mother   . COPD Daughter   . Alzheimer's disease Unknown   . COPD Sister   . Depression Sister   . Heart disease Sister   . Hyperlipidemia Sister   . Hypertension Sister   . Esophageal cancer  Sister        + smoker    PE: BP (!) 146/72   Pulse 80   Ht 6' (1.829 m)   Wt 190 lb (86.2 kg)   SpO2 93%   BMI 25.77 kg/m  Wt Readings from Last 3 Encounters:  05/14/17 190 lb (86.2 kg)  05/10/17 185 lb 11.2 oz (84.2 kg)  05/08/17 191 lb (86.6 kg)   Constitutional: Slightly overweight, in NAD, weak appearing Eyes: PERRLA, EOMI, no exophthalmos, no lid lag, no stare ENT: moist mucous membranes, no thyromegaly, no thyroid bruits, no cervical lymphadenopathy Cardiovascular: RRR, No MRG Respiratory: CTA B Gastrointestinal: abdomen soft, NT, ND, BS+ Musculoskeletal: no deformities, strength intact in all 4 Skin: moist, warm, no rashes Neurological: + tremor with outstretched hands, DTR normal in all 4  ASSESSMENT: 1.  Amiodarone-induced thyrotoxicosis  PLAN:   1.  Patient with a recently found low TSH, with thyrotoxic symptoms: + dramatic weight loss, + heat intolerance, + tremors. No hyper defecation, palpitations, anxiety.  Of note, TSH checked 4 months ago was normal.  No previous abnormal TSH levels.  He was started on amiodarone almost 3 years ago, stopped 1 mo ago.  He presented to the hospital a few days ago with increased weakness, severe weight loss, GI bleed >> A TSH was found to be undetectable and free T4 was high.  Total T3 is normal.  This pattern is not unusual for amiodarone-induced thyrotoxicosis.  He was started on methimazole 5 mg twice a day before discharge.  He is feeling much better after discharge. - We discussed that possible etiologies of his thyrotoxicosis can be:  1. New Onset Graves ds. - fairly unlikely 2. Contrast induced thyrotoxicosis (he had a CT angios on 05/08/2017) -but this would be more unusual with previously normal TFTs 3. Amiodarone-induced thyrotoxicosis (AIT.  We discussed about mechanisms for this condition but-there are 2 types of AIT: -Amiodarone-induced hyperthyroidism (type I) -Amiodarone-induced thyroiditis (type II) - The above  entities are treated differently, the first with thionamides (methimazole, PTU), the second with prednisone.  However, it is usually extremely difficult to distinguish between the 2, so commonly patients are started on both medications.  These are tapered down to off  slowly during the treatment. - There are several characteristics that point more towards one condition versus the other.  Previous history of thyroid disease (multinodular goiter, Graves' disease, Hashimoto's thyroiditis) point towards type I AIT.  Early development of thyrotoxicosis after starting amiodarone points towards the same type.  Late (many months) development of thyrotoxicosis after starting amiodarone or development of thyrotoxicosis after stopping the drug points towards type II AIT. - As he developed thyrotoxicosis late after starting amiodarone, I suspect type II AIT.  He is already on low-dose methimazole.  Usually higher doses of methimazole are needed, but patient was taken off amiodarone recently, so we may be able to continue a lower dose of methimazole.  Nevertheless, the half life of amiodarone is approximately 3 months, so it will be slow to be eliminated from his body. - At this visit, we will check his thyroglobulin and TSI antibodies (these are more likely to be elevated in type I AIT).  Also try to obtain a thyroid ultrasound to check for thyroidal blood flow.  This should be increased in type I AIT and decreased in type II AAT. - We may need to change the methimazole dose based on results. As of now, I will not change the dose of add Prednisone as he is already feeling better. - He is already on beta-blocker: Carvedilol  - I advised him to join my chart to communicate easier - RTC in 3 months, but likely sooner for repeat labs  Orders Placed This Encounter  Procedures  . US THYROID    Standing Status:   Future    Number of Occurrences:   1    Standing Expiration Date:   07/13/2018    Order Specific Question:    Reason for Exam (SYMPTOM  OR DIAGNOSIS REQUIRED)    Answer:   amiodarone-induced thyrotoxicosis - evaluate for thyroid blood flow (to differentiate type 1 from type 2 AIT)    Order Specific Question:   Preferred imaging location?    Answer:   [facility]  . TSH  . T4, free  . T3, free  . Thyroglobulin Level  . Thyroid stimulating immunoglobulin   Component     Latest Ref Rng & Units 05/14/2017  Thyroglobulin     ng/mL 289.5 (H)  Comment        TSH     0.35 - 4.50 uIU/mL <0.01 (L)  T4,Free(Direct)     0.60 - 1.60 ng/dL 4.59 (H)  Triiodothyronine,Free,Serum     2.3 - 4.2 pg/mL 5.8 (H)  TSI     <140 % baseline 172 (H)   Labs above are still abnormal in the point more towards iodine induced overproduction of thyroid hormones, rather than thyroiditis.  We will continue with methimazole, but will increase the dose to 10 mg twice a day. Will await the U/S result.  US THYROID  Order: [id number]  Status:  Final result Visible to patient:  No (Not Released) Dx:  Amiodarone-induced thyroiditis  Details   Reading Physician Reading Date Result Priority  Corrie Mckusick, DO 05/22/2017     Narrative    CLINICAL DATA: 84 year old male with a history of amiodarone induced thyrotoxicosis.  EXAM: THYROID ULTRASOUND  TECHNIQUE: Ultrasound examination of the thyroid gland and adjacent soft tissues was performed.  COMPARISON: None.  FINDINGS: Parenchymal Echotexture: Moderately heterogenous  Isthmus: 0.6 cm  Right lobe: 3.8 cm x 1.9 cm x 1.7 cm  Left lobe: 4.4 cm x 2.0 cm x 1.9 cm  _________________________________________________________  Estimated total number of nodules >/= 1 cm: 1  Number of spongiform nodules >/= 2 cm not described below (TR1): 0  Number of mixed cystic and solid nodules >/= 1.5 cm not described below (Superior): 0  _________________________________________________________  Nodule # 1:  Location: Right; Inferior  Maximum size: 0.8 cm;  Other 2 dimensions: 0.5 cm x 0.8 cm  Composition: spongiform (0)  ACR TI-RADS recommendations:  Spongiform nodule does not meet criteria for surveillance or biopsy  _________________________________________________________  Nodule # 2:  Location: Left; Superior  Maximum size: 0.9 cm; Other 2 dimensions: 0.8 cm x 0.8 cm  Composition: spongiform (0)  ACR TI-RADS recommendations:  Spongiform nodule does not meet criteria for surveillance or biopsy  _________________________________________________________  Nodule # 3:  Location: Left; Mid  Maximum size: 0.9 cm; Other 2 dimensions: 0.7 cm x 0.8 cm  Composition: spongiform (0)  ACR TI-RADS recommendations:  Spongiform nodule does not meet criteria for surveillance or biopsy  _________________________________________________________  Nodule # 4:  Location: Left; Inferior  Maximum size: 1.4 cm; Other 2 dimensions: 41.1 cm x 1.1 cm  Composition: cystic/almost completely cystic (0)  Echogenicity: anechoic (0)  Shape: not taller-than-wide (0)  Margins: smooth (0)  Echogenic foci: none (0)  ACR TI-RADS total points: 0.  ACR TI-RADS risk category: TR1 (0-1 points).  ACR TI-RADS recommendations:  Cystic nodule does not meet criteria for surveillance or biopsy  _________________________________________________________  Color flow demonstrates patchy regions of internal flow, not significantly increased within the left or right thyroid.  No adenopathy.  IMPRESSION: No thyroid nodule meets criteria for biopsy or surveillance, as designated by the newly established ACR TI-RADS criteria.  Patchy regions of internal color flow of the thyroid parenchyma, not significantly increased.  Recommendations follow those established by the new ACR TI-RADS criteria (J Am Coll Radiol 5284;13:244-010).   Electronically Signed By: Corrie Mckusick D.O. On: 05/22/2017 13:07       Thyroid ultrasound results showed  several small nodules, not worrisome, but also a blood flow that is not increased, and not necessarily consistent with overproduction thyrotoxicosis.  Therefore, I would suggest to add prednisone 20 mg daily for now and recheck his TFTs in 4-5 weeks.  Reviewing the chart, he had TFTs obtained yesterday and they were consistent with the ones from 2 weeks ago:  Component     Latest Ref Rng & Units 05/26/2017  TSH     0.350 - 4.500 uIU/mL <0.010 (L)  Triiodothyronine,Free,Serum     2.0 - 4.4 pg/mL 4.2  T4,Free(Direct)     0.61 - 1.12 ng/dL 4.01 (H)    Philemon Kingdom, MD PhD [facility] Endocr[facility]

## 2017-05-14 NOTE — Telephone Encounter (Signed)
Malachy Mood saw patient and would like Dr. Radford Pax to sign off on orders for patient. Malachy Mood would like to discus the matter

## 2017-05-15 ENCOUNTER — Telehealth: Payer: Self-pay

## 2017-05-15 MED ORDER — METHIMAZOLE 10 MG PO TABS: 10.0000 mg | ORAL_TABLET | Freq: Two times a day (BID) | ORAL | 0 refills | Status: DC

## 2017-05-15 NOTE — Telephone Encounter (Signed)
-----  Message from Philemon Kingdom, MD sent at 05/15/2017  1:34 PM EST ----- Loma Sousa, can you please call pt: Some of the results are back: they are slightly worse >> let's increase MMI to 10 mg 2x a day. Please also change this on his med list.

## 2017-05-16 DIAGNOSIS — I2510 Atherosclerotic heart disease of native coronary artery without angina pectoris: Secondary | ICD-10-CM | POA: Diagnosis not present

## 2017-05-16 DIAGNOSIS — I429 Cardiomyopathy, unspecified: Secondary | ICD-10-CM | POA: Diagnosis not present

## 2017-05-16 DIAGNOSIS — K259 Gastric ulcer, unspecified as acute or chronic, without hemorrhage or perforation: Secondary | ICD-10-CM | POA: Diagnosis not present

## 2017-05-16 DIAGNOSIS — I481 Persistent atrial fibrillation: Secondary | ICD-10-CM | POA: Diagnosis not present

## 2017-05-16 DIAGNOSIS — E440 Moderate protein-calorie malnutrition: Secondary | ICD-10-CM | POA: Diagnosis not present

## 2017-05-16 DIAGNOSIS — I130 Hypertensive heart and chronic kidney disease with heart failure and stage 1 through stage 4 chronic kidney disease, or unspecified chronic kidney disease: Secondary | ICD-10-CM | POA: Diagnosis not present

## 2017-05-16 DIAGNOSIS — I5042 Chronic combined systolic (congestive) and diastolic (congestive) heart failure: Secondary | ICD-10-CM | POA: Diagnosis not present

## 2017-05-16 DIAGNOSIS — N183 Chronic kidney disease, stage 3 (moderate): Secondary | ICD-10-CM | POA: Diagnosis not present

## 2017-05-16 DIAGNOSIS — E0590 Thyrotoxicosis, unspecified without thyrotoxic crisis or storm: Secondary | ICD-10-CM | POA: Diagnosis not present

## 2017-05-17 DIAGNOSIS — I481 Persistent atrial fibrillation: Secondary | ICD-10-CM | POA: Diagnosis not present

## 2017-05-17 DIAGNOSIS — N183 Chronic kidney disease, stage 3 (moderate): Secondary | ICD-10-CM | POA: Diagnosis not present

## 2017-05-17 DIAGNOSIS — I5042 Chronic combined systolic (congestive) and diastolic (congestive) heart failure: Secondary | ICD-10-CM | POA: Diagnosis not present

## 2017-05-17 DIAGNOSIS — I130 Hypertensive heart and chronic kidney disease with heart failure and stage 1 through stage 4 chronic kidney disease, or unspecified chronic kidney disease: Secondary | ICD-10-CM | POA: Diagnosis not present

## 2017-05-17 DIAGNOSIS — E0590 Thyrotoxicosis, unspecified without thyrotoxic crisis or storm: Secondary | ICD-10-CM | POA: Diagnosis not present

## 2017-05-17 DIAGNOSIS — I429 Cardiomyopathy, unspecified: Secondary | ICD-10-CM | POA: Diagnosis not present

## 2017-05-17 DIAGNOSIS — I2510 Atherosclerotic heart disease of native coronary artery without angina pectoris: Secondary | ICD-10-CM | POA: Diagnosis not present

## 2017-05-17 DIAGNOSIS — K259 Gastric ulcer, unspecified as acute or chronic, without hemorrhage or perforation: Secondary | ICD-10-CM | POA: Diagnosis not present

## 2017-05-17 DIAGNOSIS — E440 Moderate protein-calorie malnutrition: Secondary | ICD-10-CM | POA: Diagnosis not present

## 2017-05-17 LAB — THYROGLOBULIN LEVEL: Thyroglobulin: 289.5 ng/mL — ABNORMAL HIGH

## 2017-05-17 LAB — THYROID STIMULATING IMMUNOGLOBULIN: TSI: 172 % baseline — ABNORMAL HIGH (ref <140)

## 2017-05-20 DIAGNOSIS — N183 Chronic kidney disease, stage 3 (moderate): Secondary | ICD-10-CM | POA: Diagnosis not present

## 2017-05-20 DIAGNOSIS — E0590 Thyrotoxicosis, unspecified without thyrotoxic crisis or storm: Secondary | ICD-10-CM | POA: Diagnosis not present

## 2017-05-20 DIAGNOSIS — I5042 Chronic combined systolic (congestive) and diastolic (congestive) heart failure: Secondary | ICD-10-CM | POA: Diagnosis not present

## 2017-05-20 DIAGNOSIS — I2510 Atherosclerotic heart disease of native coronary artery without angina pectoris: Secondary | ICD-10-CM | POA: Diagnosis not present

## 2017-05-20 DIAGNOSIS — I429 Cardiomyopathy, unspecified: Secondary | ICD-10-CM | POA: Diagnosis not present

## 2017-05-20 DIAGNOSIS — I130 Hypertensive heart and chronic kidney disease with heart failure and stage 1 through stage 4 chronic kidney disease, or unspecified chronic kidney disease: Secondary | ICD-10-CM | POA: Diagnosis not present

## 2017-05-20 DIAGNOSIS — I481 Persistent atrial fibrillation: Secondary | ICD-10-CM | POA: Diagnosis not present

## 2017-05-20 DIAGNOSIS — K259 Gastric ulcer, unspecified as acute or chronic, without hemorrhage or perforation: Secondary | ICD-10-CM | POA: Diagnosis not present

## 2017-05-20 DIAGNOSIS — E440 Moderate protein-calorie malnutrition: Secondary | ICD-10-CM | POA: Diagnosis not present

## 2017-05-21 DIAGNOSIS — I429 Cardiomyopathy, unspecified: Secondary | ICD-10-CM | POA: Diagnosis not present

## 2017-05-21 DIAGNOSIS — I2510 Atherosclerotic heart disease of native coronary artery without angina pectoris: Secondary | ICD-10-CM | POA: Diagnosis not present

## 2017-05-21 DIAGNOSIS — K259 Gastric ulcer, unspecified as acute or chronic, without hemorrhage or perforation: Secondary | ICD-10-CM | POA: Diagnosis not present

## 2017-05-21 DIAGNOSIS — E0590 Thyrotoxicosis, unspecified without thyrotoxic crisis or storm: Secondary | ICD-10-CM | POA: Diagnosis not present

## 2017-05-21 DIAGNOSIS — I130 Hypertensive heart and chronic kidney disease with heart failure and stage 1 through stage 4 chronic kidney disease, or unspecified chronic kidney disease: Secondary | ICD-10-CM | POA: Diagnosis not present

## 2017-05-21 DIAGNOSIS — I5042 Chronic combined systolic (congestive) and diastolic (congestive) heart failure: Secondary | ICD-10-CM | POA: Diagnosis not present

## 2017-05-21 DIAGNOSIS — N183 Chronic kidney disease, stage 3 (moderate): Secondary | ICD-10-CM | POA: Diagnosis not present

## 2017-05-21 DIAGNOSIS — I481 Persistent atrial fibrillation: Secondary | ICD-10-CM | POA: Diagnosis not present

## 2017-05-21 DIAGNOSIS — E440 Moderate protein-calorie malnutrition: Secondary | ICD-10-CM | POA: Diagnosis not present

## 2017-05-22 ENCOUNTER — Ambulatory Visit
Admission: RE | Admit: 2017-05-22 | Discharge: 2017-05-22 | Disposition: A | Discharge status: Home / Self Care | Payer: Medicare HMO | Source: Ambulatory Visit | Attending: Internal Medicine | Admitting: Internal Medicine

## 2017-05-22 DIAGNOSIS — E0590 Thyrotoxicosis, unspecified without thyrotoxic crisis or storm: Secondary | ICD-10-CM | POA: Diagnosis not present

## 2017-05-23 DIAGNOSIS — N183 Chronic kidney disease, stage 3 (moderate): Secondary | ICD-10-CM | POA: Diagnosis not present

## 2017-05-23 DIAGNOSIS — I429 Cardiomyopathy, unspecified: Secondary | ICD-10-CM | POA: Diagnosis not present

## 2017-05-23 DIAGNOSIS — I481 Persistent atrial fibrillation: Secondary | ICD-10-CM | POA: Diagnosis not present

## 2017-05-23 DIAGNOSIS — I130 Hypertensive heart and chronic kidney disease with heart failure and stage 1 through stage 4 chronic kidney disease, or unspecified chronic kidney disease: Secondary | ICD-10-CM | POA: Diagnosis not present

## 2017-05-23 DIAGNOSIS — I5042 Chronic combined systolic (congestive) and diastolic (congestive) heart failure: Secondary | ICD-10-CM | POA: Diagnosis not present

## 2017-05-23 DIAGNOSIS — K259 Gastric ulcer, unspecified as acute or chronic, without hemorrhage or perforation: Secondary | ICD-10-CM | POA: Diagnosis not present

## 2017-05-23 DIAGNOSIS — E0590 Thyrotoxicosis, unspecified without thyrotoxic crisis or storm: Secondary | ICD-10-CM | POA: Diagnosis not present

## 2017-05-23 DIAGNOSIS — I2510 Atherosclerotic heart disease of native coronary artery without angina pectoris: Secondary | ICD-10-CM | POA: Diagnosis not present

## 2017-05-23 DIAGNOSIS — E440 Moderate protein-calorie malnutrition: Secondary | ICD-10-CM | POA: Diagnosis not present

## 2017-05-24 ENCOUNTER — Encounter: Payer: Self-pay | Admitting: Physician Assistant

## 2017-05-24 DIAGNOSIS — E440 Moderate protein-calorie malnutrition: Secondary | ICD-10-CM | POA: Diagnosis not present

## 2017-05-24 DIAGNOSIS — I130 Hypertensive heart and chronic kidney disease with heart failure and stage 1 through stage 4 chronic kidney disease, or unspecified chronic kidney disease: Secondary | ICD-10-CM | POA: Diagnosis not present

## 2017-05-24 DIAGNOSIS — E0590 Thyrotoxicosis, unspecified without thyrotoxic crisis or storm: Secondary | ICD-10-CM | POA: Diagnosis not present

## 2017-05-24 DIAGNOSIS — K259 Gastric ulcer, unspecified as acute or chronic, without hemorrhage or perforation: Secondary | ICD-10-CM | POA: Diagnosis not present

## 2017-05-24 DIAGNOSIS — I2510 Atherosclerotic heart disease of native coronary artery without angina pectoris: Secondary | ICD-10-CM | POA: Diagnosis not present

## 2017-05-24 DIAGNOSIS — K922 Gastrointestinal hemorrhage, unspecified: Secondary | ICD-10-CM | POA: Diagnosis not present

## 2017-05-24 DIAGNOSIS — I5042 Chronic combined systolic (congestive) and diastolic (congestive) heart failure: Secondary | ICD-10-CM | POA: Diagnosis not present

## 2017-05-24 DIAGNOSIS — R110 Nausea: Secondary | ICD-10-CM | POA: Diagnosis not present

## 2017-05-24 DIAGNOSIS — I481 Persistent atrial fibrillation: Secondary | ICD-10-CM | POA: Diagnosis not present

## 2017-05-24 DIAGNOSIS — I429 Cardiomyopathy, unspecified: Secondary | ICD-10-CM | POA: Diagnosis not present

## 2017-05-24 DIAGNOSIS — N183 Chronic kidney disease, stage 3 (moderate): Secondary | ICD-10-CM | POA: Diagnosis not present

## 2017-05-24 DIAGNOSIS — R911 Solitary pulmonary nodule: Secondary | ICD-10-CM | POA: Diagnosis not present

## 2017-05-24 NOTE — Progress Notes (Deleted)
Cardiology Office Note    Date:  05/24/2017  ID:  [name], DOB [birth year], MRN [id number] PCP:  [name], MD  Cardiologist: Dr. Radford Pax   Chief Complaint: f/u hospitalization for hyperthyroidism  History of Present Illness:  [name] is a 84 y.o. male with history of hypertension, CAD status post CABG and pericardial tissue AVR 6761, chronic systolic CHF/ICM ejection fraction 25-30% s/p CRT-D, paroxysmal atrial fibrillation (on Eliquis), orthostatic hypotension, CKD III, ventricular tachycardia (followed by EP), hyperthyroidism who presents for post-hospital follow-up.  Earlier this year he saw Dr. Curt Bears 04/18/17 and device interrogation showed multiple episodes of ventricular tachycardia below his detection limit. Was taken off amiodarone due to shortness of breath (had been on for several years). Had CT scan in 11/18 which showed COPD and multiple new pulmonary nodules - unclear if related to amio or possibly malignancy. Repeat CT scheduled for 3/19. Dr. Curt Bears lowered his monitor zone and detection zone and started him on mexiletine. Patient had 30 seconds of wide complex tachycardia rate of 117 bpm 1/22/19but hadjuststarted his mexiletine because of insurancereasons so Dr. Curt Bears wanted to continue to monitor this. He followed with the CHF clinic 05/08/17 for HF but at that visit reported progressive weight loss (>20 lbs in 3 weeks) and melena. EGD showed hiatal hernia, nonbleeding erosions in gastric body and antrum, biopsies done. He was started on PPI and was cleared to resume Eliquis. During that admission he also was diagnosed with hyperthyroidism which was felt to be contributing to his failure to thrive and was started on methimazole with OP f/u recommended. Labs otable for Cr 1.54, CBC wnl, LFTs ok, albumin 3.0. Last echo 08/2006 showed EF 25%, diffuse HK, no significant AVR stenosis, mod LAE. Last cath 2016 following false + nuc showed patent grafts, medical  therapy recommended.   Chronic systolic CHF/ICM Paroxysmal atrial fib CAD History of VT S/p AVR Hyperthyroidism    Past Medical History:  Diagnosis Date  . AICD (automatic cardioverter/defibrillator) present   . Arthritis    "minor everywhere" (04/22/2015)  . Asthma    "a touch"  . Barrett esophagus   . CAD (coronary artery disease)    a. s/p 2 vessel CABG with LIMA to LAD, SVG to diagonal 1, SVG to RCA 12/09.  Marland Kitchen Chronic combined systolic and diastolic CHF (congestive heart failure) (HCC)    dry weight 197-200lbs.  . CKD (chronic kidney disease), stage III ([place])   . Diabetes ([place])    "I'm prediabetic" (04/22/2015)  . GERD (gastroesophageal reflux disease)   . H/O: rheumatic fever   . Hepatitis 1957   "don't know what kind:  . History of hiatal hernia   . History of PFTs    a. Amiodarone started 5/16 >> PFTs w/ DLCO 6/16:  FEV1 72% predicted, FEV1/FVC 66%, DLCO 66% >> minimal reversible obstructive airways disease with mild diffusion defect (suggestive of emphysema but absence of hyperinflation inconsistent with dx)  . HTN (hypertension)   . Hypercholesteremia   . NICM (nonischemic cardiomyopathy) ([place])    EF 15-20% by echo 2017  . Orthostatic hypotension   . PAF (paroxysmal atrial fibrillation) ([place])   . Pneumonia 08/2014   "dr thought I may have had a touch"  . S/P AVR (aortic valve replacement) 2009   a. severe AS s/p AVR with pericardial tissue valve 2009.  Marland Kitchen Ventricular tachycardia [facility])     Past Surgical History:  Procedure Laterality Date  . AORTIC VALVE REPLACEMENT  with 23-mm  Magna Ease pericardial valve, model number   with 23-mm Magna Ease pericardial valve, model [id number], serial number [id number]   . APPENDECTOMY  1940s  . BI-VENTRICULAR IMPLANTABLE CARDIOVERTER DEFIBRILLATOR  (CRT-D)  04/22/2015  . CARDIAC CATHETERIZATION N/A 11/04/2014   Procedure: Left Heart Cath and Cors/Grafts Angiography;  Surgeon: Leonie Man, MD;  Location: [place] [facility];  Service: Cardiovascular;  Laterality: N/A;  . CARDIAC VALVE REPLACEMENT    . CARDIOVERSION N/A 01/27/2013   Procedure: CARDIOVERSION;  Surgeon: Sueanne Margarita, MD;  Location: [facility];  Service: Cardiovascular;  Laterality: N/A;  . CARDIOVERSION N/A 08/25/2014   Procedure: CARDIOVERSION;  Surgeon: Sanda Klein, MD;  [facility];  Service: Cardiovascular;  Laterality: N/A;  . CATARACT EXTRACTION W/ INTRAOCULAR LENS  IMPLANT, BILATERAL Bilateral   . CORONARY ARTERY BYPASS GRAFT  03/10/2008   x 3 Dr. Roxan Hockey  . EP IMPLANTABLE DEVICE N/A 04/22/2015   Procedure: BiV ICD Insertion CRT-D;  Surgeon: [name], MD;  Location: [place] [facility];  Service: Cardiovascular;  Laterality: N/A;  . ESOPHAGOGASTRODUODENOSCOPY (EGD) WITH ESOPHAGEAL DILATION  X1  . ESOPHAGOGASTRODUODENOSCOPY (EGD) WITH PROPOFOL N/A 05/09/2017   Procedure: ESOPHAGOGASTRODUODENOSCOPY (EGD) WITH PROPOFOL;  Surgeon: Carol Ada, MD;  Location: [place];  Service: Endoscopy;  Laterality: N/A;  . TONSILLECTOMY      Current Medications: No outpatient medications have been marked as taking for the 05/27/17 encounter (Appointment) with Charlie Pitter, PA-C.   ***   Allergies:   Novocain [procaine]   Social History   Socioeconomic History  . Marital status: Married    Spouse name: Not on file  . Number of children: Not on file  . Years of education: Not on file  . Highest education level: Not on file  Social Needs  . Financial resource strain: Not on file  . Food insecurity - worry: Not on file  . Food insecurity - inability: Not on file  . Transportation needs - medical: Not on file  . Transportation needs - non-medical: Not on file  Occupational History  . Not on file  Tobacco Use  . Smoking status: Never Smoker  . Smokeless tobacco: Never Used  Substance and Sexual Activity  . Alcohol use: Yes    Comment: 04/22/2015 "nothing since the 1980s; never had a problem w/it"  . Drug  use: No  . Sexual activity: No  Other Topics Concern  . Not on file  Social History Narrative   Married, lives with spouse [name]   4 children, 2 boys and 2 girls > one daughter passed   OCCUPATION: retired, Pension scheme manager for [employer]     Family History:  Family History  Problem Relation Age of Onset  . Alzheimer's disease Mother   . COPD Daughter   . Alzheimer's disease Unknown   . COPD Sister   . Depression Sister   . Heart disease Sister   . Hyperlipidemia Sister   . Hypertension Sister   . Esophageal cancer Sister        + smoker    Cath 2016 Conclusion   1. Widely patent LIMA-LAD, SVG-D1, SVG-distal RCA. 2. Severe single vessel CAD with occluded RCA. Prox RCA to Dist RCA lesion, 100% stenosed. 3. Moderate proximal LAD disease. 4. Prosthetic aortic valve with no evidence of stenosis    Likely false positive stress test with grossly abnormal Myoview doubt any angiographic data to support either defect. All grafts are patent. In fact the LAD lesion does not  appear to be severe. There is competitive flow in both LAD and diagonal grafts.  Recommendations:  Standard post radial cath care with TR band removal. Expected discharge later on today.  Continue aggressive of medical management of existing heart failure.     ROS:   Please see the history of present illness. Otherwise, review of systems is positive for ***.  All other systems are reviewed and otherwise negative.    PHYSICAL EXAM:   VS:  There were no vitals taken for this visit.  BMI: There is no height or weight on file to calculate BMI. GEN: Well nourished, well developed, in no acute distress  HEENT: normocephalic, atraumatic Neck: no JVD, carotid bruits, or masses Cardiac: ***RRR; no murmurs, rubs, or gallops, no edema  Respiratory:  clear to auscultation bilaterally, normal work of breathing GI: soft, nontender, nondistended, + BS MS: no deformity or atrophy  Skin: warm and dry, no  rash Neuro:  Alert and Oriented x 3, Strength and sensation are intact, follows commands Psych: euthymic mood, full affect  Wt Readings from Last 3 Encounters:  05/14/17 190 lb (86.2 kg)  05/10/17 185 lb 11.2 oz (84.2 kg)  05/08/17 191 lb (86.6 kg)      Studies/Labs Reviewed:   EKG:  EKG was ordered today and personally reviewed by me and demonstrates *** EKG was not ordered today.***  Recent Labs: 05/06/2017: NT-Pro BNP 22,420 05/08/2017: ALT 38 05/09/2017: BUN 29; Creatinine, Ser 1.54; Hemoglobin 13.1; Magnesium 2.2; Platelets 254; Potassium 4.2; Sodium 140 05/14/2017: TSH <0.01   Lipid Panel    Component Value Date/Time   CHOL 126 07/30/2016 1143   CHOL 93 (L) 06/16/2014 0914   TRIG 154 (H) 07/30/2016 1143   TRIG 124 06/16/2014 0914   HDL 45 07/30/2016 1143   HDL 34 (L) 06/16/2014 0914   CHOLHDL 2.8 07/30/2016 1143   CHOLHDL 2.7 01/19/2015 1005   VLDL 32 (H) 01/19/2015 1005   LDLCALC 50 07/30/2016 1143   LDLCALC 34 06/16/2014 0914    Additional studies/ records that were reviewed today include: Summarized above.***    ASSESSMENT & PLAN:   1. ***  Disposition: F/u with ***   Medication Adjustments/Labs and Tests Ordered: Current medicines are reviewed at length with the patient today.  Concerns regarding medicines are outlined above. Medication changes, Labs and Tests ordered today are summarized above and listed in the Patient Instructions accessible in Encounters.   Signed, Charlie Pitter, PA-C  05/24/2017 12:13 PM    [facility] [address], [place], [place]  [address] Phone: [phone]; Fax: [phone]

## 2017-05-25 DIAGNOSIS — I2510 Atherosclerotic heart disease of native coronary artery without angina pectoris: Secondary | ICD-10-CM | POA: Diagnosis not present

## 2017-05-25 DIAGNOSIS — E440 Moderate protein-calorie malnutrition: Secondary | ICD-10-CM | POA: Diagnosis not present

## 2017-05-25 DIAGNOSIS — N183 Chronic kidney disease, stage 3 (moderate): Secondary | ICD-10-CM | POA: Diagnosis not present

## 2017-05-25 DIAGNOSIS — I481 Persistent atrial fibrillation: Secondary | ICD-10-CM | POA: Diagnosis not present

## 2017-05-25 DIAGNOSIS — I429 Cardiomyopathy, unspecified: Secondary | ICD-10-CM | POA: Diagnosis not present

## 2017-05-25 DIAGNOSIS — E0590 Thyrotoxicosis, unspecified without thyrotoxic crisis or storm: Secondary | ICD-10-CM | POA: Diagnosis not present

## 2017-05-25 DIAGNOSIS — I5042 Chronic combined systolic (congestive) and diastolic (congestive) heart failure: Secondary | ICD-10-CM | POA: Diagnosis not present

## 2017-05-25 DIAGNOSIS — K259 Gastric ulcer, unspecified as acute or chronic, without hemorrhage or perforation: Secondary | ICD-10-CM | POA: Diagnosis not present

## 2017-05-25 DIAGNOSIS — I130 Hypertensive heart and chronic kidney disease with heart failure and stage 1 through stage 4 chronic kidney disease, or unspecified chronic kidney disease: Secondary | ICD-10-CM | POA: Diagnosis not present

## 2017-05-26 ENCOUNTER — Other Ambulatory Visit: Payer: Self-pay

## 2017-05-26 ENCOUNTER — Inpatient Hospital Stay (HOSPITAL_COMMUNITY): Payer: Medicare HMO

## 2017-05-26 ENCOUNTER — Encounter (HOSPITAL_COMMUNITY): Payer: Self-pay

## 2017-05-26 ENCOUNTER — Inpatient Hospital Stay (HOSPITAL_COMMUNITY)
Admission: EM | Admit: 2017-05-26 | Discharge: 2017-06-04 | DRG: 286 | Disposition: A | Discharge status: Home Health | Payer: Medicare HMO | Attending: Internal Medicine | Admitting: Internal Medicine

## 2017-05-26 ENCOUNTER — Emergency Department (HOSPITAL_COMMUNITY): Payer: Medicare HMO

## 2017-05-26 DIAGNOSIS — J948 Other specified pleural conditions: Secondary | ICD-10-CM | POA: Diagnosis not present

## 2017-05-26 DIAGNOSIS — R0600 Dyspnea, unspecified: Secondary | ICD-10-CM | POA: Diagnosis not present

## 2017-05-26 DIAGNOSIS — I5043 Acute on chronic combined systolic (congestive) and diastolic (congestive) heart failure: Secondary | ICD-10-CM | POA: Diagnosis not present

## 2017-05-26 DIAGNOSIS — I2510 Atherosclerotic heart disease of native coronary artery without angina pectoris: Secondary | ICD-10-CM | POA: Diagnosis not present

## 2017-05-26 DIAGNOSIS — E43 Unspecified severe protein-calorie malnutrition: Secondary | ICD-10-CM | POA: Diagnosis not present

## 2017-05-26 DIAGNOSIS — R05 Cough: Secondary | ICD-10-CM | POA: Diagnosis not present

## 2017-05-26 DIAGNOSIS — M1990 Unspecified osteoarthritis, unspecified site: Secondary | ICD-10-CM | POA: Diagnosis present

## 2017-05-26 DIAGNOSIS — I10 Essential (primary) hypertension: Secondary | ICD-10-CM | POA: Diagnosis present

## 2017-05-26 DIAGNOSIS — R600 Localized edema: Secondary | ICD-10-CM | POA: Diagnosis present

## 2017-05-26 DIAGNOSIS — I130 Hypertensive heart and chronic kidney disease with heart failure and stage 1 through stage 4 chronic kidney disease, or unspecified chronic kidney disease: Secondary | ICD-10-CM | POA: Diagnosis not present

## 2017-05-26 DIAGNOSIS — K219 Gastro-esophageal reflux disease without esophagitis: Secondary | ICD-10-CM | POA: Diagnosis not present

## 2017-05-26 DIAGNOSIS — J90 Pleural effusion, not elsewhere classified: Secondary | ICD-10-CM

## 2017-05-26 DIAGNOSIS — J918 Pleural effusion in other conditions classified elsewhere: Secondary | ICD-10-CM | POA: Diagnosis not present

## 2017-05-26 DIAGNOSIS — Z953 Presence of xenogenic heart valve: Secondary | ICD-10-CM | POA: Diagnosis not present

## 2017-05-26 DIAGNOSIS — I472 Ventricular tachycardia, unspecified: Secondary | ICD-10-CM

## 2017-05-26 DIAGNOSIS — R112 Nausea with vomiting, unspecified: Secondary | ICD-10-CM | POA: Diagnosis not present

## 2017-05-26 DIAGNOSIS — Z95810 Presence of automatic (implantable) cardiac defibrillator: Secondary | ICD-10-CM | POA: Diagnosis not present

## 2017-05-26 DIAGNOSIS — K2270 Barrett's esophagus without dysplasia: Secondary | ICD-10-CM | POA: Diagnosis present

## 2017-05-26 DIAGNOSIS — R059 Cough, unspecified: Secondary | ICD-10-CM

## 2017-05-26 DIAGNOSIS — R627 Adult failure to thrive: Secondary | ICD-10-CM | POA: Diagnosis present

## 2017-05-26 DIAGNOSIS — R110 Nausea: Secondary | ICD-10-CM

## 2017-05-26 DIAGNOSIS — J439 Emphysema, unspecified: Secondary | ICD-10-CM | POA: Diagnosis not present

## 2017-05-26 DIAGNOSIS — I493 Ventricular premature depolarization: Secondary | ICD-10-CM | POA: Diagnosis not present

## 2017-05-26 DIAGNOSIS — E7800 Pure hypercholesterolemia, unspecified: Secondary | ICD-10-CM | POA: Diagnosis present

## 2017-05-26 DIAGNOSIS — Z951 Presence of aortocoronary bypass graft: Secondary | ICD-10-CM

## 2017-05-26 DIAGNOSIS — K3184 Gastroparesis: Secondary | ICD-10-CM | POA: Diagnosis not present

## 2017-05-26 DIAGNOSIS — Z888 Allergy status to other drugs, medicaments and biological substances status: Secondary | ICD-10-CM

## 2017-05-26 DIAGNOSIS — I480 Paroxysmal atrial fibrillation: Secondary | ICD-10-CM | POA: Diagnosis present

## 2017-05-26 DIAGNOSIS — E1122 Type 2 diabetes mellitus with diabetic chronic kidney disease: Secondary | ICD-10-CM | POA: Diagnosis not present

## 2017-05-26 DIAGNOSIS — E0590 Thyrotoxicosis, unspecified without thyrotoxic crisis or storm: Secondary | ICD-10-CM

## 2017-05-26 DIAGNOSIS — Z961 Presence of intraocular lens: Secondary | ICD-10-CM | POA: Diagnosis present

## 2017-05-26 DIAGNOSIS — R1111 Vomiting without nausea: Secondary | ICD-10-CM | POA: Diagnosis not present

## 2017-05-26 DIAGNOSIS — R0602 Shortness of breath: Secondary | ICD-10-CM | POA: Diagnosis not present

## 2017-05-26 DIAGNOSIS — Z9841 Cataract extraction status, right eye: Secondary | ICD-10-CM

## 2017-05-26 DIAGNOSIS — Z6824 Body mass index (BMI) 24.0-24.9, adult: Secondary | ICD-10-CM

## 2017-05-26 DIAGNOSIS — I481 Persistent atrial fibrillation: Secondary | ICD-10-CM | POA: Diagnosis not present

## 2017-05-26 DIAGNOSIS — I25758 Atherosclerosis of native coronary artery of transplanted heart with other forms of angina pectoris: Secondary | ICD-10-CM | POA: Diagnosis not present

## 2017-05-26 DIAGNOSIS — Z79899 Other long term (current) drug therapy: Secondary | ICD-10-CM

## 2017-05-26 DIAGNOSIS — K5730 Diverticulosis of large intestine without perforation or abscess without bleeding: Secondary | ICD-10-CM | POA: Diagnosis present

## 2017-05-26 DIAGNOSIS — R531 Weakness: Secondary | ICD-10-CM | POA: Diagnosis not present

## 2017-05-26 DIAGNOSIS — R404 Transient alteration of awareness: Secondary | ICD-10-CM | POA: Diagnosis not present

## 2017-05-26 DIAGNOSIS — Z952 Presence of prosthetic heart valve: Secondary | ICD-10-CM | POA: Diagnosis not present

## 2017-05-26 DIAGNOSIS — N183 Chronic kidney disease, stage 3 (moderate): Secondary | ICD-10-CM | POA: Diagnosis not present

## 2017-05-26 DIAGNOSIS — E875 Hyperkalemia: Secondary | ICD-10-CM | POA: Diagnosis not present

## 2017-05-26 DIAGNOSIS — I255 Ischemic cardiomyopathy: Secondary | ICD-10-CM | POA: Diagnosis present

## 2017-05-26 DIAGNOSIS — I340 Nonrheumatic mitral (valve) insufficiency: Secondary | ICD-10-CM | POA: Diagnosis not present

## 2017-05-26 DIAGNOSIS — Z8249 Family history of ischemic heart disease and other diseases of the circulatory system: Secondary | ICD-10-CM

## 2017-05-26 DIAGNOSIS — Z7901 Long term (current) use of anticoagulants: Secondary | ICD-10-CM

## 2017-05-26 DIAGNOSIS — I25118 Atherosclerotic heart disease of native coronary artery with other forms of angina pectoris: Secondary | ICD-10-CM | POA: Diagnosis not present

## 2017-05-26 DIAGNOSIS — I4819 Other persistent atrial fibrillation: Secondary | ICD-10-CM | POA: Diagnosis present

## 2017-05-26 DIAGNOSIS — I5023 Acute on chronic systolic (congestive) heart failure: Secondary | ICD-10-CM | POA: Diagnosis not present

## 2017-05-26 DIAGNOSIS — Z9842 Cataract extraction status, left eye: Secondary | ICD-10-CM

## 2017-05-26 HISTORY — DX: Dyspnea, unspecified: R06.00

## 2017-05-26 HISTORY — DX: Pleural effusion, not elsewhere classified: J90

## 2017-05-26 HISTORY — DX: Thyrotoxicosis, unspecified without thyrotoxic crisis or storm: E05.90

## 2017-05-26 LAB — CBC WITH DIFFERENTIAL/PLATELET
Basophils Absolute: 0 10*3/uL (ref 0.0–0.1)
Basophils Relative: 0 %
Eosinophils Absolute: 0.1 10*3/uL (ref 0.0–0.7)
Eosinophils Relative: 1 %
HEMATOCRIT: 40 % (ref 39.0–52.0)
HEMOGLOBIN: 12.8 g/dL — AB (ref 13.0–17.0)
LYMPHS ABS: 2.1 10*3/uL (ref 0.7–4.0)
Lymphocytes Relative: 21 %
MCH: 28.1 pg (ref 26.0–34.0)
MCHC: 32 g/dL (ref 30.0–36.0)
MCV: 87.7 fL (ref 78.0–100.0)
MONO ABS: 1.3 10*3/uL — AB (ref 0.1–1.0)
MONOS PCT: 14 %
NEUTROS ABS: 6.3 10*3/uL (ref 1.7–7.7)
Neutrophils Relative %: 64 %
Platelets: 376 10*3/uL (ref 150–400)
RBC: 4.56 MIL/uL (ref 4.22–5.81)
RDW: 14.3 % (ref 11.5–15.5)
WBC: 9.8 10*3/uL (ref 4.0–10.5)

## 2017-05-26 LAB — COMPREHENSIVE METABOLIC PANEL
ALBUMIN: 2.9 g/dL — AB (ref 3.5–5.0)
ALK PHOS: 61 U/L (ref 38–126)
ALT: 21 U/L (ref 17–63)
ANION GAP: 15 (ref 5–15)
AST: 23 U/L (ref 15–41)
BILIRUBIN TOTAL: 0.9 mg/dL (ref 0.3–1.2)
BUN: 31 mg/dL — AB (ref 6–20)
CO2: 22 mmol/L (ref 22–32)
Calcium: 8.5 mg/dL — ABNORMAL LOW (ref 8.9–10.3)
Chloride: 106 mmol/L (ref 101–111)
Creatinine, Ser: 1.47 mg/dL — ABNORMAL HIGH (ref 0.61–1.24)
GFR calc Af Amer: 49 mL/min — ABNORMAL LOW (ref >60)
GFR calc non Af Amer: 43 mL/min — ABNORMAL LOW (ref >60)
GLUCOSE: 146 mg/dL — AB (ref 65–99)
Potassium: 3.6 mmol/L (ref 3.5–5.1)
SODIUM: 143 mmol/L (ref 135–145)
Total Protein: 5.8 g/dL — ABNORMAL LOW (ref 6.5–8.1)

## 2017-05-26 LAB — POC OCCULT BLOOD, ED
FECAL OCCULT BLD: NEGATIVE
Fecal Occult Bld: NEGATIVE

## 2017-05-26 LAB — TROPONIN I
TROPONIN I: 0.1 ng/mL — AB (ref <0.03)
Troponin I: 0.11 ng/mL (ref <0.03)
Troponin I: 0.13 ng/mL (ref <0.03)

## 2017-05-26 LAB — TYPE AND SCREEN
ABO/RH(D): A NEG
Antibody Screen: NEGATIVE

## 2017-05-26 LAB — PROTIME-INR
INR: 1.65
Prothrombin Time: 19.3 seconds — ABNORMAL HIGH (ref 11.4–15.2)

## 2017-05-26 LAB — T4, FREE: Free T4: 4.01 ng/dL — ABNORMAL HIGH (ref 0.61–1.12)

## 2017-05-26 LAB — CBG MONITORING, ED: GLUCOSE-CAPILLARY: 143 mg/dL — AB (ref 65–99)

## 2017-05-26 LAB — TSH: TSH: <0.01 u[IU]/mL — ABNORMAL LOW (ref 0.350–4.500)

## 2017-05-26 LAB — BRAIN NATRIURETIC PEPTIDE

## 2017-05-26 MED ORDER — APIXABAN 2.5 MG PO TABS
2.5000 mg | ORAL_TABLET | Freq: Two times a day (BID) | ORAL | Status: DC
  Administered 2017-05-26: 2.5 mg via ORAL
  Filled 2017-05-26 (×2): qty 1

## 2017-05-26 MED ORDER — CARVEDILOL 6.25 MG PO TABS
6.2500 mg | ORAL_TABLET | Freq: Two times a day (BID) | ORAL | Status: DC
  Administered 2017-05-27 – 2017-06-03 (×15): 6.25 mg via ORAL
  Filled 2017-05-26 (×16): qty 1

## 2017-05-26 MED ORDER — FUROSEMIDE 20 MG PO TABS
20.0000 mg | ORAL_TABLET | Freq: Every day | ORAL | Status: DC
  Administered 2017-05-27 – 2017-05-30 (×4): 20 mg via ORAL
  Filled 2017-05-26 (×5): qty 1

## 2017-05-26 MED ORDER — FAMOTIDINE 20 MG PO TABS
20.0000 mg | ORAL_TABLET | Freq: Every day | ORAL | Status: DC
  Administered 2017-05-26 – 2017-06-03 (×9): 20 mg via ORAL
  Filled 2017-05-26 (×9): qty 1

## 2017-05-26 MED ORDER — METHIMAZOLE 10 MG PO TABS
10.0000 mg | ORAL_TABLET | Freq: Two times a day (BID) | ORAL | Status: DC
  Administered 2017-05-26 – 2017-06-04 (×18): 10 mg via ORAL
  Filled 2017-05-26 (×18): qty 1

## 2017-05-26 MED ORDER — ISOSORBIDE MONONITRATE ER 30 MG PO TB24
15.0000 mg | ORAL_TABLET | Freq: Every day | ORAL | Status: DC
  Administered 2017-05-26 – 2017-05-27 (×2): 15 mg via ORAL
  Filled 2017-05-26 (×2): qty 1

## 2017-05-26 MED ORDER — ONDANSETRON HCL 4 MG/2ML IJ SOLN: 4.0000 mg | Freq: Four times a day (QID) | INTRAMUSCULAR | Status: DC | PRN

## 2017-05-26 MED ORDER — IPRATROPIUM-ALBUTEROL 0.5-2.5 (3) MG/3ML IN SOLN
3.0000 mL | Freq: Four times a day (QID) | RESPIRATORY_TRACT | Status: DC
  Administered 2017-05-26 – 2017-05-27 (×4): 3 mL via RESPIRATORY_TRACT
  Filled 2017-05-26 (×5): qty 3

## 2017-05-26 MED ORDER — ATORVASTATIN CALCIUM 20 MG PO TABS: 20.0000 mg | ORAL_TABLET | Freq: Every day | ORAL | Status: DC

## 2017-05-26 MED ORDER — PANTOPRAZOLE SODIUM 40 MG PO TBEC
40.0000 mg | DELAYED_RELEASE_TABLET | Freq: Every day | ORAL | Status: DC
  Administered 2017-05-27 – 2017-06-04 (×9): 40 mg via ORAL
  Filled 2017-05-26 (×8): qty 1

## 2017-05-26 MED ORDER — ACETAMINOPHEN 500 MG PO TABS
500.0000 mg | ORAL_TABLET | Freq: Four times a day (QID) | ORAL | Status: DC | PRN
  Administered 2017-05-27 – 2017-06-02 (×2): 500 mg via ORAL
  Filled 2017-05-26 (×2): qty 1

## 2017-05-26 MED ORDER — HYDRALAZINE HCL 25 MG PO TABS
12.5000 mg | ORAL_TABLET | Freq: Two times a day (BID) | ORAL | Status: DC
  Administered 2017-05-27 (×2): 12.5 mg via ORAL
  Filled 2017-05-26 (×3): qty 1

## 2017-05-26 MED ORDER — ONDANSETRON HCL 4 MG PO TABS
4.0000 mg | ORAL_TABLET | Freq: Four times a day (QID) | ORAL | Status: DC | PRN
  Administered 2017-06-02: 4 mg via ORAL
  Filled 2017-05-26: qty 1

## 2017-05-26 NOTE — ED Notes (Signed)
Hospitalist bedside

## 2017-05-26 NOTE — ED Notes (Signed)
Patient transported to CT 

## 2017-05-26 NOTE — ED Provider Notes (Signed)
[facility] EMERGENCY DEPARTMENT Provider Note   CSN: [id number] Arrival date & time: 05/26/17  1144     History   Chief Complaint Chief Complaint  Patient presents with  . Shortness of Breath  . Weakness    HPI [name] is a 84 y.o. male.  84 year old male with prior history of CAD, AICD placement, diabetes, GERD, hypertension, and s/p Aortic valve replacement presents with complaint of shortness of breath.  Patient reports increasing shortness of breath 3-4 weeks.  Patient reports increased symptoms over the last week.  Patient denies associated fever.  Patient denies chest pain.  Patient reports worsening shortness of breath especially with exertion.  Today, he reports that he was unable to ambulate more than a couple feet secondary to profound shortness of breath.   The history is provided by the patient, a relative and medical records.  Shortness of Breath  This is a chronic problem. The average episode lasts 4 weeks. The problem occurs continuously.The current episode started more than 1 week ago. The problem has been gradually worsening. Pertinent negatives include no fever, no cough and no chest pain. Precipitated by: exertion. Associated medical issues include chronic lung disease and CAD.    Past Medical History:  Diagnosis Date  . AICD (automatic cardioverter/defibrillator) present   . Arthritis    "minor everywhere" (04/22/2015)  . Asthma    "a touch"  . Barrett esophagus   . CAD (coronary artery disease)    a. s/p 2 vessel CABG with LIMA to LAD, SVG to diagonal 1, SVG to RCA 12/09.  Marland Kitchen Chronic combined systolic and diastolic CHF (congestive heart failure) (HCC)    dry weight 197-200lbs.  . CKD (chronic kidney disease), stage III ([place])   . Diabetes ([place])    "I'm prediabetic" (04/22/2015)  . GERD (gastroesophageal reflux disease)   . H/O: rheumatic fever   . Hepatitis 1957   "don't know what kind:  . History of hiatal hernia   . History  of PFTs    a. Amiodarone started 5/16 >> PFTs w/ DLCO 6/16:  FEV1 72% predicted, FEV1/FVC 66%, DLCO 66% >> minimal reversible obstructive airways disease with mild diffusion defect (suggestive of emphysema but absence of hyperinflation inconsistent with dx)  . HTN (hypertension)   . Hypercholesteremia   . NICM (nonischemic cardiomyopathy) ([place])    EF 15-20% by echo 2017  . Orthostatic hypotension   . PAF (paroxysmal atrial fibrillation) ([place])   . Pneumonia 08/2014   "dr thought I may have had a touch"  . S/P AVR (aortic valve replacement) 2009   a. severe AS s/p AVR with pericardial tissue valve 2009.  Marland Kitchen Ventricular tachycardia [facility])     Patient Active Problem List   Diagnosis Date Noted  . Malnutrition of moderate degree 05/09/2017  . GI bleed 05/08/2017  . Acute GI bleeding 05/08/2017  . Ventricular tachycardia ([place]) 05/06/2017  . Dizziness 04/23/2017  . Weight loss 04/23/2017  . Leg weakness 01/28/2017  . Abnormal nuclear stress test: HIGH RISK 11/04/2014  . Cardiomyopathy, ischemic: EF roughly 30% 11/04/2014  . S/P AVR (aortic valve replacement) 09/02/2014  . Chronic combined systolic and diastolic heart failure ([place])   . Shingles 08/23/2014  . CKD (chronic kidney disease) stage 3, GFR 30-59 ml/min (HCC) 08/23/2014  . Hyperkalemia 08/23/2014  . Aortic stenosis, severe 01/23/2013  . Essential hypertension 01/23/2013  . Dyslipidemia 01/23/2013  . CAD status post CABG 01/23/2013  . Diastolic dysfunction 70/35/0093  . Persistent  atrial fibrillation ([place]) 01/23/2013  . Chronic anticoagulation 01/23/2013    Past Surgical History:  Procedure Laterality Date  . AORTIC VALVE REPLACEMENT  with 23-mm Magna Ease pericardial valve, model number   with 23-mm Magna Ease pericardial valve, model [id number], serial number [id number]   . APPENDECTOMY  1940s  . BI-VENTRICULAR IMPLANTABLE CARDIOVERTER DEFIBRILLATOR  (CRT-D)  04/22/2015  . CARDIAC CATHETERIZATION N/A 11/04/2014    Procedure: Left Heart Cath and Cors/Grafts Angiography;  Surgeon: Leonie Man, MD;  Location: [facility];  Service: Cardiovascular;  Laterality: N/A;  . CARDIAC VALVE REPLACEMENT    . CARDIOVERSION N/A 01/27/2013   Procedure: CARDIOVERSION;  Surgeon: Sueanne Margarita, MD;  Location: [facility];  Service: Cardiovascular;  Laterality: N/A;  . CARDIOVERSION N/A 08/25/2014   Procedure: CARDIOVERSION;  Surgeon: Sanda Klein, MD;  [facility];  Service: Cardiovascular;  Laterality: N/A;  . CATARACT EXTRACTION W/ INTRAOCULAR LENS  IMPLANT, BILATERAL Bilateral   . CORONARY ARTERY BYPASS GRAFT  03/10/2008   x 3 Dr. Roxan Hockey  . EP IMPLANTABLE DEVICE N/A 04/22/2015   Procedure: BiV ICD Insertion CRT-D;  Surgeon: [name], MD;  Location: [facility];  Service: Cardiovascular;  Laterality: N/A;  . ESOPHAGOGASTRODUODENOSCOPY (EGD) WITH ESOPHAGEAL DILATION  X1  . ESOPHAGOGASTRODUODENOSCOPY (EGD) WITH PROPOFOL N/A 05/09/2017   Procedure: ESOPHAGOGASTRODUODENOSCOPY (EGD) WITH PROPOFOL;  Surgeon: Carol Ada, MD;  Location: [facility];  Service: Endoscopy;  Laterality: N/A;  . TONSILLECTOMY         Home Medications    Prior to Admission medications   Medication Sig Start Date End Date Taking? Authorizing Provider  acetaminophen (TYLENOL) 500 MG tablet Take 500 mg by mouth every 6 (six) hours as needed for moderate pain.    [provider]  albuterol (PROVENTIL HFA;VENTOLIN HFA) 108 (90 Base) MCG/ACT inhaler Inhale 1-2 puffs into the lungs every 6 (six) hours as needed for wheezing or shortness of breath.    [provider]  atorvastatin (LIPITOR) 20 MG tablet TAKE 1 TABLET (20 MG TOTAL) DAILY AT 6 PM. 09/27/16   Turner, Eber Hong, MD  carvedilol (COREG) 6.25 MG tablet Take 6.25 mg by mouth 2 (two) times daily with a meal.    [provider]  ELIQUIS 2.5 MG TABS tablet TAKE 1 TABLET TWICE DAILY 11/02/16   Sueanne Margarita, MD  furosemide  (LASIX) 20 MG tablet TAKE 1 TABLET (20 MG TOTAL) BY MOUTH DAILY. 09/27/16   Sueanne Margarita, MD  hydrALAZINE (APRESOLINE) 25 MG tablet Take 0.5 tablets (12.5 mg total) by mouth 2 (two) times daily before a meal. 03/14/17   Turner, Eber Hong, MD  isosorbide mononitrate (IMDUR) 30 MG 24 hr tablet Take 0.5 tablets (15 mg total) by mouth at bedtime. 04/23/17   Imogene Burn, PA-C  methimazole (TAPAZOLE) 10 MG tablet Take 1 tablet (10 mg total) by mouth 2 (two) times daily. 05/15/17   Philemon Kingdom, MD  mexiletine (MEXITIL) 250 MG capsule Take 1 capsule (250 mg total) by mouth 2 (two) times daily. 04/18/17   Camnitz, Ocie Doyne, MD  Multiple Vitamin (MULTIVITAMIN) capsule Take 1 capsule by mouth daily.    [provider]  omega-3 acid ethyl esters (LOVAZA) 1 G capsule Take 2 g by mouth 2 (two) times daily.    [provider]  pantoprazole (PROTONIX) 40 MG tablet Take 1 tablet (40 mg total) by mouth daily before breakfast. 05/10/17   Rai, Vernelle Emerald, MD  ranitidine (ZANTAC) 300  MG tablet Take 300 mg by mouth at bedtime.    [provider]  traMADol (ULTRAM) 50 MG tablet Take 1 tablet (50 mg total) by mouth every 12 (twelve) hours as needed for moderate pain or severe pain. 05/10/17   Mendel Corning, MD    Family History Family History  Problem Relation Age of Onset  . Alzheimer's disease Mother   . COPD Daughter   . Alzheimer's disease Unknown   . COPD Sister   . Depression Sister   . Heart disease Sister   . Hyperlipidemia Sister   . Hypertension Sister   . Esophageal cancer Sister        + smoker    Social History Social History   Tobacco Use  . Smoking status: Never Smoker  . Smokeless tobacco: Never Used  Substance Use Topics  . Alcohol use: Yes    Comment: 04/22/2015 "nothing since the 1980s; never had a problem w/it"  . Drug use: No     Allergies   Novocain [procaine]   Review of Systems Review of Systems  Constitutional: Negative for fever.    Respiratory: Positive for shortness of breath. Negative for cough.   Cardiovascular: Negative for chest pain.  All other systems reviewed and are negative.    Physical Exam Updated Vital Signs BP 130/85   Pulse 80   Temp 97.6 F (36.4 C) (Oral)   Resp (!) 32   Ht 6' (1.829 m)   Wt 83.5 kg (184 lb)   SpO2 97%   BMI 24.95 kg/m   Physical Exam  Constitutional: He is oriented to person, place, and time. He appears well-developed and well-nourished. No distress.  HENT:  Head: Normocephalic and atraumatic.  Mouth/Throat: Oropharynx is clear and moist.  Eyes: Conjunctivae and EOM are normal. Pupils are equal, round, and reactive to light.  Neck: Normal range of motion. Neck supple.  Cardiovascular: Normal rate, regular rhythm and normal heart sounds.  Pulmonary/Chest: Effort normal and breath sounds normal. No respiratory distress.  Abdominal: Soft. He exhibits no distension. There is no tenderness.  Musculoskeletal: Normal range of motion. He exhibits no edema or deformity.  Neurological: He is alert and oriented to person, place, and time.  Skin: Skin is warm and dry.  Psychiatric: He has a normal mood and affect.  Nursing note and vitals reviewed.    ED Treatments / Results  Labs (all labs ordered are listed, but only abnormal results are displayed) Labs Reviewed  COMPREHENSIVE METABOLIC PANEL - Abnormal; Notable for the following components:      Result Value   Glucose, Bld 146 (*)    BUN 31 (*)    Creatinine, Ser 1.47 (*)    Calcium 8.5 (*)    Total Protein 5.8 (*)    Albumin 2.9 (*)    GFR calc non Af Amer 43 (*)    GFR calc Af Amer 49 (*)    All other components within normal limits  TROPONIN I - Abnormal; Notable for the following components:   Troponin I 0.11 (*)    All other components within normal limits  CBC WITH DIFFERENTIAL/PLATELET - Abnormal; Notable for the following components:   Hemoglobin 12.8 (*)    Monocytes Absolute 1.3 (*)    All other  components within normal limits  PROTIME-INR - Abnormal; Notable for the following components:   Prothrombin Time 19.3 (*)    All other components within normal limits  BRAIN NATRIURETIC PEPTIDE - Abnormal; Notable for the  following components:   B Natriuretic Peptide >4,500.0 (*)    All other components within normal limits  CBG MONITORING, ED - Abnormal; Notable for the following components:   Glucose-Capillary 143 (*)    All other components within normal limits  POC OCCULT BLOOD, ED  POC OCCULT BLOOD, ED  TYPE AND SCREEN    EKG  EKG Interpretation  Date/Time:  Sunday May 26 2017 12:06:11 EST Ventricular Rate:  81 PR Interval:    QRS Duration: 147 QT Interval:  480 QTC Calculation: 558 R Axis:   -87 Text Interpretation:  Atrial-sensed ventricular-paced rhythm No further analysis attempted due to paced rhythm Confirmed by Dene Gentry [phone]) on 05/26/2017 12:08:50 PM       Radiology Dg Chest Port 1 View  Result Date: 05/26/2017 CLINICAL DATA:  Shortness of breath and weakness. EXAM: PORTABLE CHEST 1 VIEW COMPARISON:  CT chest and chest x-ray dated May 08, 2017. FINDINGS: Unchanged left chest wall AICD. Stable cardiomegaly. Normal pulmonary vascularity. Interval increase in size of now moderate right pleural effusion with adjacent right basilar airspace disease. The left lung is clear. No pneumothorax. No acute osseous abnormality. IMPRESSION: 1. Interval increase in size of now moderate right pleural effusion with right basilar airspace disease, favored to reflect atelectasis. Electronically Signed   By: Titus Dubin M.D.   On: 05/26/2017 12:06    Procedures Procedures (including critical care time)  Medications Ordered in ED Medications - No data to display   Initial Impression / Assessment and Plan / ED Course  I have reviewed the triage vital signs and the nursing notes.  Pertinent labs & imaging results that were available during my care of the  patient were reviewed by me and considered in my medical decision making (see chart for details).     MDM  Screen complete  Patient is presenting with increasing shortness of breath and progressive weakness.  Patient's medical history is significant for multiple cardiac issues.  I suspect that today's symptoms are primarily related to CHF. Labs are significant for mildly elevated troponin and elevated BNP. Chest XR shows increasing pleural effusion. Case discussed in person with Dr. Caryl Comes (Cardiology) who will evaluate the patient.  Dr. Jamse Arn (hospitalist) is aware of case and will evaluate for admission.    Final Clinical Impressions(s) / ED Diagnoses   Final diagnoses:  Shortness of breath    ED Discharge Orders    None       Valarie Merino, MD 05/26/17 1536

## 2017-05-26 NOTE — H&P (Signed)
History and Physical:    [name]   FJK:[id number] DOB: 08-May-1932 DOA: 05/26/2017  Referring MD/provider: Dr Francia Greaves  PCP: [name], MD   Patient coming from: Home  Chief Complaint: "I can't move without getting short of breath".  History of Present Illness:   [name] is an 84 y.o. male with past medical history significant for cardiomyopathy with an EF of 25% with ICD in place, atrial fibrillation, COPD, status post AVR, postural hypotension, DM who was discharged 15 days ago when he presented for for increasing weakness, weight loss and shortness of breath. Workup revealed hyperthyroidism which was thought to be secondary to amiodarone. Patient was started on methimazole and discharged home for follow-up with endocrinology.  Patient now presents stating that he did well for about 2 days after discharge and "it started getting bad again". Patient's main complaints are profound difficulty with ambulation. He describes a feeling of "feeling sick in my stomach" every time he tries to walk. He is also extremely short of breath with ambulation. Patient notes these symptoms have been present pretty much without change for the past 2 weeks however he came in today because his children forced him to come because he was unable to get off the couch because he was so weak. Patient does admit to some orthopnea which is also been going on for the past couple of weeks. Patient notes he is unable to walk across even a couple of steps due to discomfort in his abdominal area and shortness of breath.  Patient also complains of difficulty eating although it's unclear to me whether this is dysphagia, anorexia or gastric parous cyst. He complains nausea and a discomfort and abdominal area almost all the time but again it's worse with any exertion. Patient denies any fevers or chills but does admit to sweats during the day which have been present for several months. These are been attributed  to hyperthyroidism. He also notes a 20 pound weight loss over the past couple of weeks which he also mentioned on last admission last month. Patient also notes he's had several episodes of paroxysmal coughing such that he felt that he was cannot vomit. These have been occurring over the past several weeks. These are entirely nonproductive without hemoptysis or any phlegm production.   ED Course:  The patient underwent a chest x-ray which shows increasing pleural effusions and new oxygen requirement.  ROS:   ROS   Per history of present illness  Past Medical History:   Past Medical History:  Diagnosis Date  . AICD (automatic cardioverter/defibrillator) present   . Arthritis    "minor everywhere" (04/22/2015)  . Asthma    "a touch"  . Barrett esophagus   . CAD (coronary artery disease)    a. s/p 2 vessel CABG with LIMA to LAD, SVG to diagonal 1, SVG to RCA 12/09.  Marland Kitchen Chronic combined systolic and diastolic CHF (congestive heart failure) (HCC)    dry weight 197-200lbs.  . CKD (chronic kidney disease), stage III ([place])   . Diabetes ([place])    "I'm prediabetic" (04/22/2015)  . GERD (gastroesophageal reflux disease)   . H/O: rheumatic fever   . Hepatitis 1957   "don't know what kind:  . History of hiatal hernia   . History of PFTs    a. Amiodarone started 5/16 >> PFTs w/ DLCO 6/16:  FEV1 72% predicted, FEV1/FVC 66%, DLCO 66% >> minimal reversible obstructive airways disease with mild diffusion defect (suggestive of emphysema  but absence of hyperinflation inconsistent with dx)  . HTN (hypertension)   . Hypercholesteremia   . NICM (nonischemic cardiomyopathy) ([place])    EF 15-20% by echo 2017  . Orthostatic hypotension   . PAF (paroxysmal atrial fibrillation) ([place])   . Pneumonia 08/2014   "dr thought I may have had a touch"  . S/P AVR (aortic valve replacement) 2009   a. severe AS s/p AVR with pericardial tissue valve 2009.  Marland Kitchen Ventricular tachycardia [facility])     Past Surgical History:    Past Surgical History:  Procedure Laterality Date  . AORTIC VALVE REPLACEMENT  with 23-mm Magna Ease pericardial valve, model number   with 23-mm Magna Ease pericardial valve, model [id number], serial number [id number]   . APPENDECTOMY  1940s  . BI-VENTRICULAR IMPLANTABLE CARDIOVERTER DEFIBRILLATOR  (CRT-D)  04/22/2015  . CARDIAC CATHETERIZATION N/A 11/04/2014   Procedure: Left Heart Cath and Cors/Grafts Angiography;  Surgeon: Leonie Man, MD;  Location: [place] [facility];  Service: Cardiovascular;  Laterality: N/A;  . CARDIAC VALVE REPLACEMENT    . CARDIOVERSION N/A 01/27/2013   Procedure: CARDIOVERSION;  Surgeon: Sueanne Margarita, MD;  Location: [place];  Service: Cardiovascular;  Laterality: N/A;  . CARDIOVERSION N/A 08/25/2014   Procedure: CARDIOVERSION;  Surgeon: Sanda Klein, MD;  Location: [facility];  Service: Cardiovascular;  Laterality: N/A;  . CATARACT EXTRACTION W/ INTRAOCULAR LENS  IMPLANT, BILATERAL Bilateral   . CORONARY ARTERY BYPASS GRAFT  03/10/2008   x 3 Dr. Roxan Hockey  . EP IMPLANTABLE DEVICE N/A 04/22/2015   Procedure: BiV ICD Insertion CRT-D;  Surgeon: [name], MD;  Location: [place] [facility];  Service: Cardiovascular;  Laterality: N/A;  . ESOPHAGOGASTRODUODENOSCOPY (EGD) WITH ESOPHAGEAL DILATION  X1  . ESOPHAGOGASTRODUODENOSCOPY (EGD) WITH PROPOFOL N/A 05/09/2017   Procedure: ESOPHAGOGASTRODUODENOSCOPY (EGD) WITH PROPOFOL;  Surgeon: Carol Ada, MD;  Location: [place];  Service: Endoscopy;  Laterality: N/A;  . TONSILLECTOMY      Social History:   Social History   Socioeconomic History  . Marital status: Married    Spouse name: Not on file  . Number of children: Not on file  . Years of education: Not on file  . Highest education level: Not on file  Social Needs  . Financial resource strain: Not on file  . Food insecurity - worry: Not on file  . Food insecurity - inability: Not on file  . Transportation needs - medical: Not on  file  . Transportation needs - non-medical: Not on file  Occupational History  . Not on file  Tobacco Use  . Smoking status: Never Smoker  . Smokeless tobacco: Never Used  Substance and Sexual Activity  . Alcohol use: Yes    Comment: 04/22/2015 "nothing since the 1980s; never had a problem w/it"  . Drug use: No  . Sexual activity: No  Other Topics Concern  . 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 Alzheimer's disease Mother   . COPD Daughter   . Alzheimer's disease Unknown   . COPD Sister   . Depression Sister   . Heart disease Sister   . Hyperlipidemia Sister   . Hypertension Sister   . Esophageal cancer Sister        +  smoker    Current Medications:   Prior to Admission medications   Medication Sig Start Date End Date Taking? Authorizing Provider  acetaminophen (TYLENOL) 500 MG tablet Take 500 mg by mouth every 6 (six) hours as needed for moderate pain.    [provider]  albuterol (PROVENTIL HFA;VENTOLIN HFA) 108 (90 Base) MCG/ACT inhaler Inhale 1-2 puffs into the lungs every 6 (six) hours as needed for wheezing or shortness of breath.    [provider]  atorvastatin (LIPITOR) 20 MG tablet TAKE 1 TABLET (20 MG TOTAL) DAILY AT 6 PM. 09/27/16   Turner, Eber Hong, MD  carvedilol (COREG) 6.25 MG tablet Take 6.25 mg by mouth 2 (two) times daily with a meal.    [provider]  ELIQUIS 2.5 MG TABS tablet TAKE 1 TABLET TWICE DAILY 11/02/16   Sueanne Margarita, MD  furosemide (LASIX) 20 MG tablet TAKE 1 TABLET (20 MG TOTAL) BY MOUTH DAILY. 09/27/16   Sueanne Margarita, MD  hydrALAZINE (APRESOLINE) 25 MG tablet Take 0.5 tablets (12.5 mg total) by mouth 2 (two) times daily before a meal. 03/14/17    Turner, Eber Hong, MD  isosorbide mononitrate (IMDUR) 30 MG 24 hr tablet Take 0.5 tablets (15 mg total) by mouth at bedtime. 04/23/17   Imogene Burn, PA-C  methimazole (TAPAZOLE) 10 MG tablet Take 1 tablet (10 mg total) by mouth 2 (two) times daily. 05/15/17   Philemon Kingdom, MD  mexiletine (MEXITIL) 250 MG capsule Take 1 capsule (250 mg total) by mouth 2 (two) times daily. 04/18/17   Camnitz, Ocie Doyne, MD  Multiple Vitamin (MULTIVITAMIN) capsule Take 1 capsule by mouth daily.    [provider]  omega-3 acid ethyl esters (LOVAZA) 1 G capsule Take 2 g by mouth 2 (two) times daily.    [provider]  pantoprazole (PROTONIX) 40 MG tablet Take 1 tablet (40 mg total) by mouth daily before breakfast. 05/10/17   Rai, Ripudeep K, MD  ranitidine (ZANTAC) 300 MG tablet Take 300 mg by mouth at bedtime.    [provider]  traMADol (ULTRAM) 50 MG tablet Take 1 tablet (50 mg total) by mouth every 12 (twelve) hours as needed for moderate pain or severe pain. 05/10/17   Mendel Corning, MD    Physical Exam:   Vitals:   05/26/17 1615 05/26/17 1630 05/26/17 1645 05/26/17 1700  BP: (!) 159/93 140/74 140/74 (!) 149/85  Pulse: 95 85 83 80  Resp: (!) 29 (!) 23 (!) 25 (!) 24  Temp:      TempSrc:      SpO2: 97% 96% 97% 97%  Weight:      Height:         Physical Exam: Blood pressure (!) 149/85, pulse 80, temperature 97.6 F (36.4 C), temperature source Oral, resp. rate (!) 24, height 6' (1.829 m), weight 83.5 kg (184 lb), SpO2 97 %. Gen: Thin man sitting up in bed coughing paroxysmally. Eyes: Sclerae anicteric. Conjunctiva mildly injected. Neck: Supple, no jugular venous distention noted with patient sitting up straight. Chest: Poor air entry bilaterally with decreased breath sounds throughout. No wheezes rales or rhonchi noted at all.  CV: Distant, regular, no audible murmurs. Abdomen: NABS, soft, nondistended, nontender. No tenderness to light or deep palpation. No rebound,  no guarding. Extremities: 1+ edema to the ankles alone, no other edema noted. Neuro: Oriented times 3; grossly nonfocal.  Data Review:    Labs: Basic Metabolic Panel: Recent Labs  Lab 05/26/17  1226  NA 143  K 3.6  CL 106  CO2 22  GLUCOSE 146*  BUN 31*  CREATININE 1.47*  CALCIUM 8.5*   Liver Function Tests: Recent Labs  Lab 05/26/17 1226  AST 23  ALT 21  ALKPHOS 61  BILITOT 0.9  PROT 5.8*  ALBUMIN 2.9*   No results for input(s): LIPASE, AMYLASE in the last 168 hours. No results for input(s): AMMONIA in the last 168 hours. CBC: Recent Labs  Lab 05/26/17 1226  WBC 9.8  NEUTROABS 6.3  HGB 12.8*  HCT 40.0  MCV 87.7  PLT 376   Cardiac Enzymes: Recent Labs  Lab 05/26/17 1226  TROPONINI 0.11*    BNP (last 3 results) Recent Labs    04/18/17 1126 04/23/17 1048 05/06/17 1110  PROBNP 10,928* 10,848* 22,420*   CBG: Recent Labs  Lab 05/26/17 1212  GLUCAP 143*    Urinalysis    Component Value Date/Time   COLORURINE YELLOW 05/08/2017 [place] 05/08/2017 1353   LABSPEC 1.033 (H) 05/08/2017 1353   PHURINE 5.0 05/08/2017 1353   GLUCOSEU NEGATIVE 05/08/2017 1353   HGBUR NEGATIVE 05/08/2017 [place] 05/08/2017 1353   KETONESUR NEGATIVE 05/08/2017 1353   PROTEINUR NEGATIVE 05/08/2017 1353   UROBILINOGEN 0.2 03/08/2008 1519   NITRITE NEGATIVE 05/08/2017 1353   LEUKOCYTESUR NEGATIVE 05/08/2017 1353      Radiographic Studies: Dg Chest Port 1 View  Result Date: 05/26/2017 CLINICAL DATA:  Shortness of breath and weakness. EXAM: PORTABLE CHEST 1 VIEW COMPARISON:  CT chest and chest x-ray dated May 08, 2017. FINDINGS: Unchanged left chest wall AICD. Stable cardiomegaly. Normal pulmonary vascularity. Interval increase in size of now moderate right pleural effusion with adjacent right basilar airspace disease. The left lung is clear. No pneumothorax. No acute osseous abnormality. IMPRESSION: 1. Interval increase in size  of now moderate right pleural effusion with right basilar airspace disease, favored to reflect atelectasis. Electronically Signed   By: Titus Dubin M.D.   On: 05/26/2017 12:06    EKG: Independently reviewed. Paced rhythm   Assessment/Plan:   Principal Problem:   CHF (congestive heart failure), NYHA class IV, acute on chronic, combined ([place]) Active Problems:   Essential hypertension   CAD status post CABG   Persistent atrial fibrillation (HCC)   Hyperthyroidism   Nausea   Cough   Pleural effusion   WEAKNESS/FATIGUE I'm not entirely clear as to the cause of patient's symptoms. Certainly end-stage heart disease can cause anorexia, weight loss, shortness of breath orthopnea and pleural effusions especially given BNP 4500. However several of the symptoms could also be secondary to apathetic hyperthyroidism. Also of note patient has a new pleural effusion of unclear etiology which has worsened over the past month which may be secondary to hypothyroidism versus worsening heart failure. He is also noted to have a 6 mm subpleural left upper lobe nodule which was new on last admission. He may or may not have a mass hiding in there. I don't think patient has had a PE since he is anticoagulated on Eliquis Plan: CT today Repeat echocardiogram Consider tapping the pleural effusion which could be secondary to hyperthyroidism Await cardiology consultation to see how best to optimize cardiac function Consider inpatient endocrinology consultation if we think this symptoms are from apathetic thyrotoxicosis.  ELEVATED TROPONIN Patient received aspirin, will continue carvedilol and atorvastatin EKG is paced Will trend troponins every [facility] Cardiology consultation  ABDOMINAL DISCOMFORT It is unclear to me whether the abdominal discomfort  that the patient is describing is secondary to an anginal equivalent or whether he has gastroparesis or whether this is from his GERD/gastritis.. With an elevated  troponin I suppose this could be an anginal equivalent, but patient states the symptoms have been present for several weeks. Also of note, as discussed with Dr. Caryl Comes from cardiology, mexiletine can be associated with significant GI discomfort. Will hold mexiletine while patient is in house to see if GI discomfort is resolved per his recommendations. Lastly this may be gastroparesis. Could consider adding a promotility agent if further workup is negative.  HYPERTHYROIDISM Will order free T3, free T4 and TSH Would titrate methimazole up as tolerated.  PAROXYSMAL COUGHING May well be secondary to increasing pleural effusion Pertussis is in the differential however patient states that he only coughs once every couple of days which would be unusual for pertussis. Could consider treating with azithromycin if this does end up being higher on the differential  SOB Is multifactorial. Likely secondary to increasing restriction and lungs with increasing pleural effusion as well as poor cardiac output. He also may have an element of mild pulmonary fibrosis from his amiodarone. I'm not sure that patient has any pulmonary vascular congestion, his lungs are clear although he does have poor air entry. He does have a history of COPD and likely has decreased lung tissue. He is not wheezing and I do not believe that he is having a flare. A benefit from a trial of prednisone if other workup is negative.  HTN Continue Carvedilol 6.25, hydralazine 25 mg, Lasix 20 mg daily, atorvastatin `  ATRIAL FIBRILLATION On Eliquis and mexiletine, rate is controlled on carvedilol Previously on amiodarone however this was discontinued secondary to concerns about lung and thyroid function as noted above  S/P AORTIC VALVE REPLACEMENT Tissue valve  DM Does not appear to be on any diabetic medications at home which may be secondary to his significant weight loss. Will check fingersticks before meals and at bedtime.  GERD Will  try omeprazole although GI discomfort may well be from mexiletine versus anginal equivalent as noted above.    Other information:   DVT prophylaxis: On Eliquis Code Status: Full code. Family Communication: Wife and children are aware patient is in house  Disposition Plan: Home  Consults called: Cardiology Dr Caryl Comes Admission status: Inpatient    Dewaine Oats Derek Jack [facility] Pager [phone] Cell: [phone]   If 7PM-7AM, please contact night-coverage [email or web address] Password [facility] 05/26/2017, 5:35 PM

## 2017-05-26 NOTE — Consult Note (Signed)
ELECTROPHYSIOLOGY CONSULT NOTE  Patient ID: [name], MRN: [id number], DOB/AGE: [birth year] 84 y.o. Admit date: 05/26/2017 Date of Consult: 05/26/2017  Primary Physician: [name], MD Primary Cardiologist: [name] is a 84 y.o. male who is being seen today for the evaluation of SOB and Weakness//Dyspnea  at the request of Dr Francia Greaves.   Chief Complaint: weakness sob anorexia and nausea   HPI [name] is a 84 y.o. male  Presenting with ongoing nausea and anorexia, progressive weakness and shortness of breath  He has a history of ischemic heart disease with prior bypass surgery and pericardial tissue AVR 2009.  He has chronic systolic heart failure with an ejection fraction of 25-30% with prior CRT-D.  Last EF assessment 5/18 he has had recurrent ventricular tachycardia not infrequently below his detection rate.  There have been concerns about the use of amiodarone because of dyspnea; he was started on mexiletine.  He was seen 3 weeks ago in the emergency room in the context of rapid weight loss (20 pounds in 3 weeks) associated with "melena" a term in this case which described no change in anemia but heme positive stool.  Also noted to be hyperthyroid with a TSH of less than 0.01; he was started on methimazole.    He has struggled with nausea and anorexia.  Standing also has been associated with worsening abdominal discomfort there is a fair amount of postprandial discomfort.  He is very weak upon standing, and basically at this point is not able to do so.  As noted he has significant dyspnea with exertion.  He sleeps on 2 pillows and this is chronic.  He has had little bit of peripheral edema.  Past Medical History:  Diagnosis Date  . AICD (automatic cardioverter/defibrillator) present   . Arthritis    "minor everywhere" (04/22/2015)  . Asthma    "a touch"  . Barrett esophagus   . CAD (coronary artery disease)    a. s/p 2 vessel CABG with LIMA to  LAD, SVG to diagonal 1, SVG to RCA 12/09.  Marland Kitchen Chronic combined systolic and diastolic CHF (congestive heart failure) (HCC)    dry weight 197-200lbs.  . CKD (chronic kidney disease), stage III ([place])   . Diabetes ([place])    "I'm prediabetic" (04/22/2015)  . GERD (gastroesophageal reflux disease)   . H/O: rheumatic fever   . Hepatitis 1957   "don't know what kind:  . History of hiatal hernia   . History of PFTs    a. Amiodarone started 5/16 >> PFTs w/ DLCO 6/16:  FEV1 72% predicted, FEV1/FVC 66%, DLCO 66% >> minimal reversible obstructive airways disease with mild diffusion defect (suggestive of emphysema but absence of hyperinflation inconsistent with dx)  . HTN (hypertension)   . Hypercholesteremia   . NICM (nonischemic cardiomyopathy) ([place])    EF 15-20% by echo 2017  . Orthostatic hypotension   . PAF (paroxysmal atrial fibrillation) ([place])   . Pneumonia 08/2014   "dr thought I may have had a touch"  . S/P AVR (aortic valve replacement) 2009   a. severe AS s/p AVR with pericardial tissue valve 2009.  Marland Kitchen Ventricular tachycardia [facility])       Surgical History:  Past Surgical History:  Procedure Laterality Date  . AORTIC VALVE REPLACEMENT  with 23-mm Magna Ease pericardial valve, model number   with 23-mm Magna Ease pericardial valve, model [id number], serial number [id number]   . APPENDECTOMY  1940s  . BI-VENTRICULAR IMPLANTABLE CARDIOVERTER  DEFIBRILLATOR  (CRT-D)  04/22/2015  . CARDIAC CATHETERIZATION N/A 11/04/2014   Procedure: Left Heart Cath and Cors/Grafts Angiography;  Surgeon: Leonie Man, MD;  Location: [facility];  Service: Cardiovascular;  Laterality: N/A;  . CARDIAC VALVE REPLACEMENT    . CARDIOVERSION N/A 01/27/2013   Procedure: CARDIOVERSION;  Surgeon: Sueanne Margarita, MD;  Location: [facility];  Service: Cardiovascular;  Laterality: N/A;  . CARDIOVERSION N/A 08/25/2014   Procedure: CARDIOVERSION;  Surgeon: Sanda Klein, MD;  Location: [facility];  Service:  Cardiovascular;  Laterality: N/A;  . CATARACT EXTRACTION W/ INTRAOCULAR LENS  IMPLANT, BILATERAL Bilateral   . CORONARY ARTERY BYPASS GRAFT  03/10/2008   x 3 Dr. Roxan Hockey  . EP IMPLANTABLE DEVICE N/A 04/22/2015   Procedure: BiV ICD Insertion CRT-D;  Surgeon: [name], MD;  Location: [facility];  Service: Cardiovascular;  Laterality: N/A;  . ESOPHAGOGASTRODUODENOSCOPY (EGD) WITH ESOPHAGEAL DILATION  X1  . ESOPHAGOGASTRODUODENOSCOPY (EGD) WITH PROPOFOL N/A 05/09/2017   Procedure: ESOPHAGOGASTRODUODENOSCOPY (EGD) WITH PROPOFOL;  Surgeon: Carol Ada, MD;  Location: [facility];  Service: Endoscopy;  Laterality: N/A;  . TONSILLECTOMY       Home Meds: Prior to Admission medications   Medication Sig Start Date End Date Taking? Authorizing Provider  acetaminophen (TYLENOL) 500 MG tablet Take 500 mg by mouth every 6 (six) hours as needed for moderate pain.    [provider]  albuterol (PROVENTIL HFA;VENTOLIN HFA) 108 (90 Base) MCG/ACT inhaler Inhale 1-2 puffs into the lungs every 6 (six) hours as needed for wheezing or shortness of breath.    [provider]  atorvastatin (LIPITOR) 20 MG tablet TAKE 1 TABLET (20 MG TOTAL) DAILY AT 6 PM. 09/27/16   Turner, Eber Hong, MD  carvedilol (COREG) 6.25 MG tablet Take 6.25 mg by mouth 2 (two) times daily with a meal.    [provider]  ELIQUIS 2.5 MG TABS tablet TAKE 1 TABLET TWICE DAILY 11/02/16   Sueanne Margarita, MD  furosemide (LASIX) 20 MG tablet TAKE 1 TABLET (20 MG TOTAL) BY MOUTH DAILY. 09/27/16   Sueanne Margarita, MD  hydrALAZINE (APRESOLINE) 25 MG tablet Take 0.5 tablets (12.5 mg total) by mouth 2 (two) times daily before a meal. 03/14/17   Turner, Eber Hong, MD  isosorbide mononitrate (IMDUR) 30 MG 24 hr tablet Take 0.5 tablets (15 mg total) by mouth at bedtime. 04/23/17   Imogene Burn, PA-C  methimazole (TAPAZOLE) 10 MG tablet Take 1 tablet (10 mg total) by mouth 2 (two) times daily. 05/15/17   Philemon Kingdom, MD  mexiletine (MEXITIL) 250 MG capsule Take 1 capsule (250 mg total) by mouth 2 (two) times daily. 04/18/17   Camnitz, Ocie Doyne, MD  Multiple Vitamin (MULTIVITAMIN) capsule Take 1 capsule by mouth daily.    [provider]  omega-3 acid ethyl esters (LOVAZA) 1 G capsule Take 2 g by mouth 2 (two) times daily.    [provider]  pantoprazole (PROTONIX) 40 MG tablet Take 1 tablet (40 mg total) by mouth daily before breakfast. 05/10/17   Rai, Ripudeep K, MD  ranitidine (ZANTAC) 300 MG tablet Take 300 mg by mouth at bedtime.    [provider]  traMADol (ULTRAM) 50 MG tablet Take 1 tablet (50 mg total) by mouth every 12 (twelve) hours as needed for moderate pain or severe pain. 05/10/17   Mendel Corning, MD      Allergies:  Allergies  Allergen Reactions  . Novocain [  Procaine] Swelling    Social History   Socioeconomic History  . Marital status: Married    Spouse name: Not on file  . Number of children: Not on file  . Years of education: Not on file  . Highest education level: Not on file  Social Needs  . Financial resource strain: Not on file  . Food insecurity - worry: Not on file  . Food insecurity - inability: Not on file  . Transportation needs - medical: Not on file  . Transportation needs - non-medical: Not on file  Occupational History  . Not on file  Tobacco Use  . Smoking status: Never Smoker  . Smokeless tobacco: Never Used  Substance and Sexual Activity  . Alcohol use: Yes    Comment: 04/22/2015 "nothing since the 1980s; never had a problem w/it"  . Drug use: No  . Sexual activity: No  Other Topics Concern  . Not on file  Social History Narrative   Married, lives with spouse [name]   4 children, 2 boys and 2 girls > one daughter passed   OCCUPATION: retired, Pension scheme manager for [employer]     Family History  Problem Relation Age of Onset  . Alzheimer's disease Mother   . COPD Daughter   . Alzheimer's disease  Unknown   . COPD Sister   . Depression Sister   . Heart disease Sister   . Hyperlipidemia Sister   . Hypertension Sister   . Esophageal cancer Sister        + smoker    ROS:  Please see the history of present illness.     All other systems reviewed and negative.    Physical Exam: Blood pressure (!) 149/91, pulse 85, temperature 97.6 F (36.4 C), temperature source Oral, resp. rate (!) 29, height 6' (1.829 m), weight 184 lb (83.5 kg), SpO2 96 %. General: Well developed, well nourished male in no acute distress. Head: Normocephalic, atraumatic, sclera non-icteric, no xanthomas, nares are without discharge. EENT: normal Lymph Nodes:  none Back: without scoliosis/kyphosis , no CVA tendersness Neck: Negative for carotid bruits. JVD 6 or so w +HJR Lungs: Clear bilaterally to auscultation without wheezes, rales, or rhonchi. Breathing is unlabored. Heart: RRR with S1 S2. No * murmur , rubs, or gallops appreciated. Abdomen: Soft, non-tender, non-distended with normoactive bowel sounds. No hepatomegaly. No rebound/guarding. No obvious abdominal masses. Msk:  Strength and tone appear normal for age. Extremities: No clubbing or cyanosis. tr edema.  Distal pedal pulses are 2+ and equal bilaterally. Skin: Warm and Dry  Cap refill about 2.5 sec  Neuro: Alert and oriented X 3. CN III-XII intact Grossly normal sensory and motor function . Psych:  Responds to questions appropriately with a normal affect.      Labs: Cardiac Enzymes Recent Labs    05/26/17 1226  TROPONINI 0.11*   CBC Lab Results  Component Value Date   WBC 9.8 05/26/2017   HGB 12.8 (L) 05/26/2017   HCT 40.0 05/26/2017   MCV 87.7 05/26/2017   PLT 376 05/26/2017   PROTIME: Recent Labs    05/26/17 1226  LABPROT 19.3*  INR 1.65   Chemistry  Recent Labs  Lab 05/26/17 1226  NA 143  K 3.6  CL 106  CO2 22  BUN 31*  CREATININE 1.47*  CALCIUM 8.5*  PROT 5.8*  BILITOT 0.9  ALKPHOS 61  ALT 21  AST 23  GLUCOSE  146*   Lipids Lab Results  Component Value Date  CHOL 126 07/30/2016   HDL 45 07/30/2016   LDLCALC 50 07/30/2016   TRIG 154 (H) 07/30/2016   BNP NT-Pro BNP  Date/Time Value Ref Range Status  05/06/2017 11:10 AM 22,420 (H) 0 - 486 pg/mL Final    Comment:    The following cut-points have been suggested for the use of proBNP for the diagnostic evaluation of heart failure (HF) in patients with acute dyspnea: Modality                     Age           Optimal Cut                            (years)            Point ------------------------------------------------------ Diagnosis (rule in HF)        <50            450 pg/mL                           50 - 75            900 pg/mL                               >75           1800 pg/mL Exclusion (rule out HF)  Age independent     300 pg/mL   04/23/2017 10:48 AM 10,848 (H) 0 - 486 pg/mL Final    Comment:    The following cut-points have been suggested for the use of proBNP for the diagnostic evaluation of heart failure (HF) in patients with acute dyspnea: Modality                     Age           Optimal Cut                            (years)            Point ------------------------------------------------------ Diagnosis (rule in HF)        <50            450 pg/mL                           50 - 75            900 pg/mL                               >75           1800 pg/mL Exclusion (rule out HF)  Age independent     300 pg/mL   04/18/2017 11:26 AM 10,928 (H) 0 - 486 pg/mL Final    Comment:    The following cut-points have been suggested for the use of proBNP for the diagnostic evaluation of heart failure (HF) in patients with acute dyspnea: Modality                     Age           Optimal Cut                            (  years)            Point ------------------------------------------------------ Diagnosis (rule in HF)        <50            450 pg/mL                           50 - 75            900 pg/mL                                >75           1800 pg/mL Exclusion (rule out HF)  Age independent     300 pg/mL    Thyroid Function Tests: No results for input(s): TSH, T4TOTAL, T3FREE, THYROIDAB in the last 72 hours.  Invalid input(s): FREET3    Miscellaneous No results found for: DDIMER  Radiology/Studies:  Ct Abdomen Pelvis Wo Contrast  Result Date: 05/09/2017 CLINICAL DATA:  Weight loss unintentional with generalized abdominal pain. EXAM: CT ABDOMEN AND PELVIS WITHOUT CONTRAST TECHNIQUE: Multidetector CT imaging of the abdomen and pelvis was performed following the standard protocol without IV contrast. COMPARISON:  08/17/2013 and MRI 09/07/2013 FINDINGS: Lower chest: Mild emphysematous disease in the lung bases. Moderate size right effusion and smaller left effusion with associated bibasilar atelectasis. Cardiac pacer leads over the heart. Mild calcification of the mitral valve annulus. Hepatobiliary: Contrast within the gallbladder from recent chest CT. Liver and biliary tree are within normal. Pancreas: Normal. Spleen: Normal. Adrenals/Urinary Tract: Adrenal glands are normal. Kidneys are normal in size without hydronephrosis or nephrolithiasis. Ureters are within normal. Contrast fills the bladder. Stomach/Bowel: Stomach and small bowel are within normal. Appendix is normal. There is moderate diverticulosis of the colon most prominent over the sigmoid colon. No active inflammation. Vascular/Lymphatic: Moderate calcified plaque over the abdominal aorta and iliac arteries. No aneurysm. No adenopathy. Reproductive: Within normal. Other: No free fluid or focal inflammatory change. Musculoskeletal: Degenerate change of the spine and mild degenerate change of the hips. IMPRESSION: No acute findings in the abdomen/pelvis. No evidence of mass or adenopathy. Moderate size right pleural effusion and small left pleural effusion with associated bibasilar atelectasis. Colonic diverticulosis. Aortic Atherosclerosis  (ICD10-I70.0). Electronically Signed   By: Marin Olp M.D.   On: 05/09/2017 19:40   Dg Chest 2 View  Result Date: 05/08/2017 CLINICAL DATA:  Generalized weakness. Shortness of breath on exertion. Decreased P.O. intake for 3 weeks. EXAM: CHEST  2 VIEW COMPARISON:  CT chest 03/05/2017.  PA and lateral chest 02/25/2017. FINDINGS: There is cardiomegaly without edema. The patient is status post CABG with a pacing device in place. Small bilateral pleural effusions and basilar airspace disease are greater on the left and increased slightly since the prior plain films. No pneumothorax. IMPRESSION: Small right greater than left pleural effusions and basilar airspace disease have increased since the prior plain films. Cardiomegaly without edema. Electronically Signed   By: Inge Rise M.D.   On: 05/08/2017 11:51   Ct Angio Chest Pe W And/or Wo Contrast  Result Date: 05/08/2017 CLINICAL DATA:  Shortness of breath and generalized weakness. EXAM: CT ANGIOGRAPHY CHEST WITH CONTRAST TECHNIQUE: Multidetector CT imaging of the chest was performed using the standard protocol during bolus administration of intravenous contrast. Multiplanar CT image reconstructions and MIPs were obtained to evaluate the vascular anatomy. CONTRAST:  86m ISOVUE-370 IOPAMIDOL (ISOVUE-370)  INJECTION 76% COMPARISON:  03/05/2017. FINDINGS: Cardiovascular: Negative for pulmonary embolus. Atherosclerotic calcification of the arterial vasculature. Pulmonary arteries and heart are enlarged. No pericardial effusion. Mediastinum/Nodes: Mediastinal and right hilar lymph nodes are not enlarged by CT size criteria. No axillary adenopathy. Esophagus is grossly unremarkable. Lungs/Pleura: Image quality is degraded by respiratory motion. Moderate right pleural effusion with compressive atelectasis in the right lower lobe. Small left pleural effusion with minimal compressive atelectasis in the left lower lobe. 6 mm anterior left upper lobe nodule (series  6, image 69), appears new. Additional scattered pulmonary nodules measure 4 mm or less in size and are stable. Airway is unremarkable. Upper Abdomen: Visualized portions of the liver, gallbladder, adrenal glands, kidneys, spleen, pancreas, stomach and bowel are grossly unremarkable. No upper abdominal adenopathy. Musculoskeletal: Degenerative changes in the spine. No worrisome lytic or sclerotic lesions. Review of the MIP images confirms the above findings. IMPRESSION: 1. Bilateral pleural effusions and associated compressive atelectasis, right greater than left. 2. 6 mm subpleural left upper lobe nodule appears new from 03/05/2017. Consider short-term follow-up in 3 months with CT chest without contrast, as clinically indicated. 3. Additional scattered pulmonary nodules are stable. 4.  Aortic atherosclerosis (ICD10-170.0). 5.  Emphysema (ICD10-J43.9). 6. Enlarged pulmonary arteries, indicative of pulmonary arterial hypertension. Electronically Signed   By: Lorin Picket M.D.   On: 05/08/2017 13:15   Dg Chest Port 1 View  Result Date: 05/26/2017 CLINICAL DATA:  Shortness of breath and weakness. EXAM: PORTABLE CHEST 1 VIEW COMPARISON:  CT chest and chest x-ray dated May 08, 2017. FINDINGS: Unchanged left chest wall AICD. Stable cardiomegaly. Normal pulmonary vascularity. Interval increase in size of now moderate right pleural effusion with adjacent right basilar airspace disease. The left lung is clear. No pneumothorax. No acute osseous abnormality. IMPRESSION: 1. Interval increase in size of now moderate right pleural effusion with right basilar airspace disease, favored to reflect atelectasis. Electronically Signed   By: Titus Dubin M.D.   On: 05/26/2017 12:06   Personally reviewed     US Thyroid  Result Date: 05/22/2017 CLINICAL DATA:  84 year old male with a history of amiodarone induced thyrotoxicosis. EXAM: THYROID ULTRASOUND TECHNIQUE: Ultrasound examination of the thyroid gland and  adjacent soft tissues was performed. COMPARISON:  None. FINDINGS: Parenchymal Echotexture: Moderately heterogenous Isthmus: 0.6 cm Right lobe: 3.8 cm x 1.9 cm x 1.7 cm Left lobe: 4.4 cm x 2.0 cm x 1.9 cm _________________________________________________________ Estimated total number of nodules >/= 1 cm: 1 Number of spongiform nodules >/=  2 cm not described below (TR1): 0 Number of mixed cystic and solid nodules >/= 1.5 cm not described below ([place]): 0 _________________________________________________________ Nodule # 1: Location: Right; Inferior Maximum size: 0.8 cm; Other 2 dimensions: 0.5 cm x 0.8 cm Composition: spongiform (0) ACR TI-RADS recommendations: Spongiform nodule does not meet criteria for surveillance or biopsy _________________________________________________________ Nodule # 2: Location: Left; Superior Maximum size: 0.9 cm; Other 2 dimensions: 0.8 cm x 0.8 cm Composition: spongiform (0) ACR TI-RADS recommendations: Spongiform nodule does not meet criteria for surveillance or biopsy _________________________________________________________ Nodule # 3: Location: Left; Mid Maximum size: 0.9 cm; Other 2 dimensions: 0.7 cm x 0.8 cm Composition: spongiform (0) ACR TI-RADS recommendations: Spongiform nodule does not meet criteria for surveillance or biopsy _________________________________________________________ Nodule # 4: Location: Left; Inferior Maximum size: 1.4 cm; Other 2 dimensions: 41.1 cm x 1.1 cm Composition: cystic/almost completely cystic (0) Echogenicity: anechoic (0) Shape: not taller-than-wide (0) Margins: smooth (0) Echogenic foci: none (0) ACR TI-RADS total points:  0. ACR TI-RADS risk category: TR1 (0-1 points). ACR TI-RADS recommendations: Cystic nodule does not meet criteria for surveillance or biopsy _________________________________________________________ Color flow demonstrates patchy regions of internal flow, not significantly increased within the left or right thyroid. No  adenopathy. IMPRESSION: No thyroid nodule meets criteria for biopsy or surveillance, as designated by the newly established ACR TI-RADS criteria. Patchy regions of internal color flow of the thyroid parenchyma, not significantly increased. Recommendations follow those established by the new ACR TI-RADS criteria (J Am Coll Radiol 8916;94:503-888). Electronically Signed   By: Corrie Mckusick D.O.   On: 05/22/2017 13:07    EKG: ECG demonstrates P synchronous pacing   Assessment and Plan:  Hyperthyroidism-newly diagnosed  Coronary artery disease-ischemic cardiomyopathy  CABG AVR   CRT-D [employer]  Congestive heart failure-chronic/acute-systolic  Nausea/ anorexia   Orthostatic intolerance   Right pleural effusion  Picture is confusing to me    There is almost certainly a component of heart failure manifested by dyspnea and worsening pleural effusions.  We will need to interrogate his device but at last check he was pacing about 95% of the time in the ventricle.  Last LV function assessment 5/18 EF was 25% we will repeat his echocardiogram.  He has significant nausea.  This is a common side effect of mexiletine so we will hold it.   His hyperthyroidism may well be a significant component.  And Mast.  He is atrially paced predominantly and so his heart rate will not reflect the degree of hyperthyroidism.  His methimazole is been increased.  He is on a low-dose beta-blocker.  Propranolol and atenolol are preferentially recommended.  Given his heart issues, I will cheat and switch his carvedilol to metoprolol.  He may benefit from steroids.  The mechanism of his hyperthyroidism, presumed to be amiodarone related, is a little bit confusing as his T3 and T4 are both markedly elevated.  He has a significant right pleural effusion.  He may benefit from having this tapped;  he is on apixaban and this would have to be stopped      Virl Axe

## 2017-05-26 NOTE — ED Notes (Signed)
Lab to add on TSH, T3, T4 to previous collection

## 2017-05-26 NOTE — ED Triage Notes (Signed)
Pt arrives to ED from home with complaints of shortness of breath and increased fatigue since x2 months. EMS reports pt has CHF and decrease in PO intake the last few days, hx of pacemaker defibrillator. Pt denies n/v/d. Pt placed in position of comfort with bed locked and lowered, call bell in reach.

## 2017-05-27 ENCOUNTER — Telehealth: Payer: Self-pay | Admitting: Endocrinology

## 2017-05-27 ENCOUNTER — Ambulatory Visit: Payer: Medicare HMO | Admitting: Physician Assistant

## 2017-05-27 ENCOUNTER — Inpatient Hospital Stay (HOSPITAL_COMMUNITY): Payer: Medicare HMO

## 2017-05-27 ENCOUNTER — Encounter (HOSPITAL_COMMUNITY): Payer: Self-pay | Admitting: General Practice

## 2017-05-27 DIAGNOSIS — I340 Nonrheumatic mitral (valve) insufficiency: Secondary | ICD-10-CM

## 2017-05-27 DIAGNOSIS — E43 Unspecified severe protein-calorie malnutrition: Secondary | ICD-10-CM

## 2017-05-27 HISTORY — DX: Unspecified severe protein-calorie malnutrition: E43

## 2017-05-27 HISTORY — PX: IR THORACENTESIS ASP PLEURAL SPACE W/IMG GUIDE: IMG5380

## 2017-05-27 LAB — CBC
HCT: 41.7 % (ref 39.0–52.0)
Hemoglobin: 13.4 g/dL (ref 13.0–17.0)
MCH: 28.5 pg (ref 26.0–34.0)
MCHC: 32.1 g/dL (ref 30.0–36.0)
MCV: 88.7 fL (ref 78.0–100.0)
PLATELETS: 322 10*3/uL (ref 150–400)
RBC: 4.7 MIL/uL (ref 4.22–5.81)
RDW: 14.7 % (ref 11.5–15.5)
WBC: 9.1 10*3/uL (ref 4.0–10.5)

## 2017-05-27 LAB — GLUCOSE, PLEURAL OR PERITONEAL FLUID: Glucose, Fluid: 149 mg/dL

## 2017-05-27 LAB — LACTATE DEHYDROGENASE, PLEURAL OR PERITONEAL FLUID: LD FL: 29 U/L — AB (ref 3–23)

## 2017-05-27 LAB — BODY FLUID CELL COUNT WITH DIFFERENTIAL
Eos, Fluid: 1 %
Lymphs, Fluid: 26 %
MONOCYTE-MACROPHAGE-SEROUS FLUID: 59 % (ref 50–90)
NEUTROPHIL FLUID: 14 % (ref 0–25)
OTHER CELLS FL: 0 %
Total Nucleated Cell Count, Fluid: 193 cu mm (ref 0–1000)

## 2017-05-27 LAB — GLUCOSE, CAPILLARY
GLUCOSE-CAPILLARY: 182 mg/dL — AB (ref 65–99)
Glucose-Capillary: 92 mg/dL (ref 65–99)
Glucose-Capillary: 119 mg/dL — ABNORMAL HIGH (ref 65–99)

## 2017-05-27 LAB — ALBUMIN, PLEURAL OR PERITONEAL FLUID

## 2017-05-27 LAB — COMPREHENSIVE METABOLIC PANEL
ALK PHOS: 56 U/L (ref 38–126)
ALT: 22 U/L (ref 17–63)
AST: 32 U/L (ref 15–41)
Albumin: 2.8 g/dL — ABNORMAL LOW (ref 3.5–5.0)
Anion gap: 14 (ref 5–15)
BUN: 31 mg/dL — AB (ref 6–20)
CALCIUM: 8.4 mg/dL — AB (ref 8.9–10.3)
CHLORIDE: 108 mmol/L (ref 101–111)
CO2: 23 mmol/L (ref 22–32)
CREATININE: 1.5 mg/dL — AB (ref 0.61–1.24)
GFR, EST AFRICAN AMERICAN: 48 mL/min — AB (ref >60)
GFR, EST NON AFRICAN AMERICAN: 42 mL/min — AB (ref >60)
Glucose, Bld: 95 mg/dL (ref 65–99)
Potassium: 3.6 mmol/L (ref 3.5–5.1)
Sodium: 145 mmol/L (ref 135–145)
Total Bilirubin: 0.9 mg/dL (ref 0.3–1.2)
Total Protein: 5.8 g/dL — ABNORMAL LOW (ref 6.5–8.1)

## 2017-05-27 LAB — MRSA PCR SCREENING: MRSA BY PCR: NEGATIVE

## 2017-05-27 LAB — TROPONIN I: TROPONIN I: 0.1 ng/mL — AB (ref <0.03)

## 2017-05-27 LAB — MAGNESIUM: MAGNESIUM: 2.1 mg/dL (ref 1.7–2.4)

## 2017-05-27 LAB — T3, FREE: T3 FREE: 4.2 pg/mL (ref 2.0–4.4)

## 2017-05-27 MED ORDER — ENSURE ENLIVE PO LIQD
237.0000 mL | Freq: Two times a day (BID) | ORAL | Status: DC
  Administered 2017-05-27: 237 mL via ORAL

## 2017-05-27 MED ORDER — BOOST PLUS PO LIQD
237.0000 mL | Freq: Three times a day (TID) | ORAL | Status: DC
  Administered 2017-05-28 (×2): 237 mL via ORAL
  Filled 2017-05-27 (×13): qty 237

## 2017-05-27 MED ORDER — OXYCODONE HCL 5 MG PO TABS
5.0000 mg | ORAL_TABLET | Freq: Four times a day (QID) | ORAL | Status: DC | PRN
  Administered 2017-05-27: 5 mg via ORAL
  Filled 2017-05-27: qty 1

## 2017-05-27 MED ORDER — LIDOCAINE HCL (PF) 2 % IJ SOLN
INTRAMUSCULAR | Status: AC | PRN
  Administered 2017-05-27: 10 mL

## 2017-05-27 MED ORDER — METOPROLOL TARTRATE 5 MG/5ML IV SOLN
2.5000 mg | Freq: Once | INTRAVENOUS | Status: DC
  Filled 2017-05-27: qty 5

## 2017-05-27 MED ORDER — POTASSIUM CHLORIDE CRYS ER 20 MEQ PO TBCR
40.0000 meq | EXTENDED_RELEASE_TABLET | Freq: Once | ORAL | Status: AC
  Administered 2017-05-27: 40 meq via ORAL
  Filled 2017-05-27: qty 2

## 2017-05-27 MED ORDER — TRAMADOL HCL 50 MG PO TABS
50.0000 mg | ORAL_TABLET | Freq: Two times a day (BID) | ORAL | Status: DC | PRN
  Administered 2017-05-27 – 2017-05-28 (×2): 50 mg via ORAL
  Filled 2017-05-27 (×2): qty 1

## 2017-05-27 MED ORDER — LIDOCAINE 2% (20 MG/ML) 5 ML SYRINGE
INTRAMUSCULAR | Status: AC
  Filled 2017-05-27: qty 10

## 2017-05-27 NOTE — Plan of Care (Signed)
  Activity: Risk for activity intolerance will decrease 05/27/2017 0514 - Completed/Met by Evert Kohl, RN   Activity: Capacity to carry out activities will improve 05/27/2017 0514 - Completed/Met by Evert Kohl, RN   Education: Ability to verbalize understanding of medication therapies will improve 05/27/2017 0514 - Completed/Met by Evert Kohl, RN   Completed/Met Education: Ability to verbalize understanding of medication therapies will improve 05/27/2017 0514 - Completed/Met by Evert Kohl, RN Activity: Capacity to carry out activities will improve 05/27/2017 0514 - Completed/Met by Evert Kohl, RN Cardiac: Ability to achieve and maintain adequate cardiopulmonary perfusion will improve 05/27/2017 0514 - Completed/Met by Evert Kohl, RN Activity: Risk for activity intolerance will decrease 05/27/2017 0514 - Completed/Met by Evert Kohl, RN Nutrition: Adequate nutrition will be maintained 05/27/2017 0514 - Completed/Met by Evert Kohl, RN Pain Managment: General experience of comfort will improve 05/27/2017 0514 - Completed/Met by Evert Kohl, RN   Activity: Capacity to carry out activities will improve 05/27/2017 0514 - Completed/Met by Evert Kohl, RN   Cardiac: Ability to achieve and maintain adequate cardiopulmonary perfusion will improve 05/27/2017 0514 - Completed/Met by Evert Kohl, RN

## 2017-05-27 NOTE — Progress Notes (Signed)
Metoprolol was not given on my shift. Patient's VS are stable.

## 2017-05-27 NOTE — Plan of Care (Signed)
  Skin Integrity: Risk for impaired skin integrity will decrease 05/27/2017 0530 - Completed/Met by Evert Kohl, RN   Pain Managment: General experience of comfort will improve 05/27/2017 0514 - Completed/Met by Evert Kohl, RN   Nutrition: Adequate nutrition will be maintained 05/27/2017 0514 - Completed/Met by Evert Kohl, RN   Activity: Risk for activity intolerance will decrease 05/27/2017 0514 - Completed/Met by Evert Kohl, RN   Cardiac: Ability to achieve and maintain adequate cardiopulmonary perfusion will improve 05/27/2017 0514 - Completed/Met by Evert Kohl, RN   Education: Ability to verbalize understanding of medication therapies will improve 05/27/2017 0514 - Completed/Met by Garnett-Mellinger, Sophronia Simas, RN

## 2017-05-27 NOTE — Progress Notes (Signed)
PROGRESS NOTE    HORALD BIRKY  NOM:[id number] DOB: 03-Dec-1932 DOA: 05/26/2017 PCP: [name], MD   Brief Narrative: [name] is an 84 y.o. male with past medical history significant for cardiomyopathy with an EF of 25% with ICD in place, atrial fibrillation, COPD, status post AVR, postural hypotension, DM who was discharged 15 days ago when he presented for for increasing weakness, weight loss and shortness of breath. Workup revealed hyperthyroidism which was thought to be secondary to amiodarone. Patient was started on methimazole and discharged home for follow-up with endocrinology.  Patient now presents stating that he did well for about 2 days after discharge and "it started getting bad again". Patient's main complaints are profound difficulty with ambulation. He describes a feeling of "feeling sick in my stomach" every time he tries to walk. He is also extremely short of breath with ambulation. Patient notes these symptoms have been present pretty much without change for the past 2 weeks however he came in today because his children forced him to come because he was unable to get off the couch because he was so weak. Patient does admit to some orthopnea which is also been going on for the past couple of weeks. Patient notes he is unable to walk across even a couple of steps due to discomfort in his abdominal area and shortness of breath.  He complains nausea and a discomfort and abdominal area almost all the time but again it's worse with any exertion. Patient denied any fevers or chills but does admit to sweats during the day which have been present for several months. These are been attributed to hyperthyroidism. He also notes a 20 pound weight loss over the past couple of weeks which he also mentioned on last admission last month. Patient also notes he's had several episodes of paroxysmal coughing such that he felt that he was cannot vomit. These have been occurring over the past  several weeks. These are entirely nonproductive without hemoptysis or any phlegm production. Admitted for Weakness and found to have a Right Pleural Effusion. Cardiology consulting. Patient underwent Thoracentesis today.   Assessment & Plan:   Principal Problem:   CHF (congestive heart failure), NYHA class IV, acute on chronic, combined ([place]) Active Problems:   Essential hypertension   CAD status post CABG   Persistent atrial fibrillation (HCC)   Hyperthyroidism   Nausea   Cough   Pleural effusion   Protein-calorie malnutrition, severe  GENERALIZED WEAKNESS/FATIGUE -Unclear Etiology.  -Certainly end-stage heart disease can cause anorexia, weight loss, shortness of breath orthopnea and pleural effusions especially given BNP 4500.  -However several of the symptoms could also be secondary to apathetic hyperthyroidism.  -Also of note patient has a new pleural effusion of unclear etiology which has worsened over the past month which may be secondary to hypothyroidism versus worsening heart failure.  -He is also noted to have a 6 mm subpleural left upper lobe nodule which was new on last admission. He may or may not have a mass hiding in there.  -Less Likely PE since he is anticoagulated on Eliquis -ECHOCARDIOGRAM done but pending read -Ordered Thoracentesis for Pleural Effusion -Cardiology Consulting and appreciated; They are planning on checking Orthostatics and Discontinuing Statin  -May discuss with Patient's Primary Endocrinologist if we think this symptoms are from apathetic thyrotoxicosis.  ELEVATED TROPONIN -Patient received aspirin, will continue carvedilol and atorvastatin -EKG is paced -Will trend troponins every 6H3; Went from 0.13 -> 0.10 -> 0.10 -Cardiology consultation appreciated -  Checking ECHOCardiogram and done but pending read  RIGHT PLEURAL EFFUSION S/p Thoracentesis  -Held Eliquis and ordered IR Consult -IR Drained 2.0 Liters off -Fluid Sent for Analysis    -Having Re-expansion Pain so started Tramadol   ABDOMINAL DISCOMFORT -Unclear whether abdominal discomfort that the patient is describing is secondary to an anginal equivalent or whether he has gastroparesis or whether this is from his GERD/gastritis. -Also of note, Cardiology stated mexiletine can be associated with significant GI discomfort.  -Will hold mexiletine while patient is in house to see if GI discomfort is resolved per his recommendations.  -May be gastroparesis and considering adding a promotility agent if further workup is negative.  HYPERTHYROIDISM -TSH was <0.010, T3 was 4.2, and Free T4 was 4.01 -C/w Methimazole 10 mg po BID -May discuss with Patient's Primary Endocrinologist   PAROXYSMAL COUGHING -Likely secondary to increasing pleural effusion -Check Respiratory Virus Panel   SOB -Multifactorial. Likely secondary to increasing restriction and lungs with increasing pleural effusion as well as poor cardiac output.  -He also may have an element of mild pulmonary fibrosis from his amiodarone.  -He does have a history of COPD and likely has decreased lung tissue. May benefit from a trial of prednisone if other workup is negative. -Check ECHO -C/w DuoNeb q6h -C/w Furosemide 20 mg po Daily   HTN -Continue Carvedilol 6.25, Hydralazine 25 mg, Lasix 20 mg daily,          `  PAROXSYMAL ATRIAL FIBRILLATION -On Eliquis and mexiletine, rate is controlled on carvedilol -Previously on amiodarone however this was discontinued secondary to concerns about lung and thyroid function as noted above -Eliquis Held for Thoracentesis and and Mexiletine held because of GI discomfort  -Cardiology following and appreciate further recc's  S/P AORTIC VALVE REPLACEMENT -Tissue valve -Check ECHOCARDIOGRAM -Cardiology following and appreciate additional recommendations  Diabetes Mellitus Type 2 -Diet Controlled -Does not appear to be on any diabetic medications at home which may  be secondary to his significant weight loss.  -Will check fingersticks before meals and at bedtime. -CBG's ranging from 92-182  GERD/Hx of Barrett Esophagus  -Will try omeprazole although GI discomfort; C/w Pantoprazole 40 mg po Daily  -may well be from mexiletine versus anginal equivalent as noted above.   Severe Malnutrition in the Context of Chronic Illness -Appreciated Nutritionist Evaluation and Consultation  -Ensure Enlive po BID D/C'd -C/w Boost Plus TID  CKD Stage 3 -BUN/Cr went from 31/1.47 -> 31/1.50 -Avoid Nephrotoxins -Repeat CMP in AM   CAD s/p CABG -Cardiology following  -Discontinued his Statin   Acute on Chronic Systolic CHF -Cardiology following -C/w Coreg for now but Cardiology may change to Metoprolol -C/w Hydralazine and Imdur -Repeat ECHOCardiogram -Strict I's/O's, Daily Weights, SLIV -Patient is -70 mL; Weight is essentially the same -On Lasix 20 mg po daily and will defer to Cardiology adjust -Per Dr. Warren Lacy may need Right Heart Cath and Ionotropes but he is discussing with Heart Failure tam  DVT prophylaxis: Anticoagulated with Apixaban Code Status: FULL CODE Family Communication: No family present at bedside Disposition Plan: Remain Inpatient  Consultants:   Cardiology  Interventional Radiology   Procedures:  Right Thoracentesis yielding 2.0 Liters of Clear, Yellow Fluid  ECHOCARDIOGRAM done and pending read  Antimicrobials:  Anti-infectives (From admission, onward)   None     Subjective: Seen and examined and states he feels extremely SOB when exerting himself. Also complaining of some Lower abdominal Pain. No CP.   Objective: Vitals:   05/27/17 1424 05/27/17  1548 05/27/17 1648 05/27/17 1701  BP: 112/87 116/66  120/64  Pulse:  76  75  Resp:    18  Temp:    98 F (36.7 C)  TempSrc:    Oral  SpO2:  98% 94% 94%  Weight:      Height:        Intake/Output Summary (Last 24 hours) at 05/27/2017 1713 Last data filed at  05/27/2017 1149 Gross per 24 hour  Intake 480 ml  Output 550 ml  Net -70 ml   Filed Weights   05/26/17 1158 05/26/17 2130 05/27/17 0614  Weight: 83.5 kg (184 lb) 83.9 kg (185 lb) 83.8 kg (184 lb 12.8 oz)   Examination: Physical Exam:  Constitutional: WN/WD Caucasian male in NAD Eyes: Lids and conjunctivae normal, sclerae anicteric  ENMT: External Ears, Nose appear normal. Grossly normal hearing. Mucous membranes are moist.  Neck: Appears normal, supple, no cervical masses, normal ROM, some JVD Respiratory: Diminished to auscultation bilaterally worse on Right than left, Had some crackles. Slightly increased respiratory effort and patient is not tachypenic. No accessory muscle use.  Cardiovascular: RRR, no murmurs / rubs / gallops. S1 and S2 auscultated. Had some extremity edema.  Abdomen: Soft,Tender in Lower Abdomen, Distended due to Body habitus. No masses palpated. No appreciable hepatosplenomegaly. Bowel sounds positive.  GU: Deferred. Musculoskeletal: No clubbing / cyanosis of digits/nails. No joint deformity upper and lower extremities.  Skin: No rashes, lesions, ulcers on a limited skin eval. No induration; Warm and dry.  Neurologic: CN 2-12 grossly intact with no focal deficits. Romberg sign cerebellar reflexes not assessed.  Psychiatric: Normal judgment and insight. Alert and oriented x 3. Normal mood and appropriate affect.   Data Reviewed: I have personally reviewed following labs and imaging studies  CBC: Recent Labs  Lab 05/26/17 1226 05/27/17 0617  WBC 9.8 9.1  NEUTROABS 6.3  --   HGB 12.8* 13.4  HCT 40.0 41.7  MCV 87.7 88.7  PLT 376 276   Basic Metabolic Panel: Recent Labs  Lab 05/26/17 1226 05/27/17 0617  NA 143 145  K 3.6 3.6  CL 106 108  CO2 22 23  GLUCOSE 146* 95  BUN 31* 31*  CREATININE 1.47* 1.50*  CALCIUM 8.5* 8.4*   GFR: Estimated Creatinine Clearance: 41.7 mL/min (A) (by C-G formula based on SCr of 1.5 mg/dL (H)). Liver Function  Tests: Recent Labs  Lab 05/26/17 1226 05/27/17 0617  AST 23 32  ALT 21 22  ALKPHOS 61 56  BILITOT 0.9 0.9  PROT 5.8* 5.8*  ALBUMIN 2.9* 2.8*   No results for input(s): LIPASE, AMYLASE in the last 168 hours. No results for input(s): AMMONIA in the last 168 hours. Coagulation Profile: Recent Labs  Lab 05/26/17 1226  INR 1.65   Cardiac Enzymes: Recent Labs  Lab 05/26/17 1226 05/26/17 1812 05/26/17 2258 05/27/17 0617  TROPONINI 0.11* 0.13* 0.10* 0.10*   BNP (last 3 results) Recent Labs    04/18/17 1126 04/23/17 1048 05/06/17 1110  PROBNP 10,928* 10,848* 22,420*   HbA1C: No results for input(s): HGBA1C in the last 72 hours. CBG: Recent Labs  Lab 05/26/17 1212 05/27/17 0730 05/27/17 1110  GLUCAP 143* 92 182*   Lipid Profile: No results for input(s): CHOL, HDL, LDLCALC, TRIG, CHOLHDL, LDLDIRECT in the last 72 hours. Thyroid Function Tests: Recent Labs    05/26/17 1233  TSH <0.010*  FREET4 4.01*  T3FREE 4.2   Anemia Panel: No results for input(s): VITAMINB12, FOLATE, FERRITIN, TIBC, IRON, RETICCTPCT in  the last 72 hours. Sepsis Labs: No results for input(s): PROCALCITON, LATICACIDVEN in the last 168 hours.  Recent Results (from the past 240 hour(s))  MRSA PCR Screening     Status: None   Collection Time: 05/27/17  8:08 AM  Result Value Ref Range Status   MRSA by PCR NEGATIVE NEGATIVE Final    Comment:        The GeneXpert MRSA Assay (FDA approved for NASAL specimens only), is one component of a comprehensive MRSA colonization surveillance program. It is not intended to diagnose MRSA infection nor to guide or monitor treatment for MRSA infections. Performed at [facility], [place] [address] [place], [place] [address]     Radiology Studies: Dg Chest 1 View  Result Date: 05/27/2017 CLINICAL DATA:  Shortness of breath. Pleural effusions. Status post right thoracentesis. EXAM: CHEST 1 VIEW COMPARISON:  Chest x-ray and CT scan dated 05/26/2017  FINDINGS: There has been marked reduction in the size of the right pleural effusion. There is a small right residual effusion and a small left effusion. Cardiomegaly. CABG. AICD in place. Pulmonary vascularity is normal. Aortic atherosclerosis. IMPRESSION: No pneumothorax after right thoracentesis. Small residual right effusion.  Small left effusion is unchanged. Aortic Atherosclerosis (ICD10-I70.0). Electronically Signed   By: Lorriane Shire M.D.   On: 05/27/2017 14:43   Ct Chest Wo Contrast  Addendum Date: 05/26/2017   ADDENDUM REPORT: 05/26/2017 19:42 ADDENDUM: High density sludge or stones in the gallbladder Electronically Signed   By: Donavan Foil M.D.   On: 05/26/2017 19:42   Result Date: 05/26/2017 CLINICAL DATA:  Persistent EXAM: CT CHEST WITHOUT CONTRAST TECHNIQUE: Multidetector CT imaging of the chest was performed following the standard protocol without IV contrast. COMPARISON:  Radiograph 05/26/2017, CT chest 05/08/2017, 03/05/2017, 05/08/2016 FINDINGS: Cardiovascular: Limited evaluation without intravenous contrast. Moderate-to-marked aortic atherosclerosis. No aneurysmal dilatation. Post CABG changes. Coronary artery calcification. Partially visualized cardiac pacing leads. Dense mitral calcification. Aortic valve replacement. Mild cardiomegaly. No significant pericardial effusion Mediastinum/Nodes: Midline trachea. No thyroid mass. Stable multiple slightly enlarged lymph nodes, for example AP window lymph node measures 15 mm. Low right paratracheal lymph node measures 1 cm. Esophagus within normal limits. Lungs/Pleura: Moderate to large right-sided pleural effusion with mild loculation along the pulmonary fissure. Small left-sided pleural effusion, both are increased since the prior CT. Mild emphysema. Progressive consolidation at the right lower lobe. Passive atelectasis in the left lower lobe. Scarring in the left apex. Small upper lobe lung nodules similar compared to most recent prior CT.  Upper Abdomen: Diffuse increased density in the gallbladder. Musculoskeletal: Post sternotomy changes. Degenerative changes of the spine. No acute or suspicious lesion. IMPRESSION: 1. Moderate large right pleural effusion and small left pleural effusion, both increased compared to prior CT. Progression in consolidation at the right lower lobe which may reflect worsening atelectasis or pneumonia 2. Stable mediastinal adenopathy, possibly reactive 3. Mild emphysema. 4. Small upper lobe lung nodules, see prior CT report 03/05/2017 and 05/08/2016 for follow-up recommendation. Aortic Atherosclerosis (ICD10-I70.0) and Emphysema (ICD10-J43.9). Electronically Signed: By: Donavan Foil M.D. On: 05/26/2017 18:54   Dg Chest Port 1 View  Result Date: 05/26/2017 CLINICAL DATA:  Shortness of breath and weakness. EXAM: PORTABLE CHEST 1 VIEW COMPARISON:  CT chest and chest x-ray dated May 08, 2017. FINDINGS: Unchanged left chest wall AICD. Stable cardiomegaly. Normal pulmonary vascularity. Interval increase in size of now moderate right pleural effusion with adjacent right basilar airspace disease. The left lung is clear. No pneumothorax. No  acute osseous abnormality. IMPRESSION: 1. Interval increase in size of now moderate right pleural effusion with right basilar airspace disease, favored to reflect atelectasis. Electronically Signed   By: Titus Dubin M.D.   On: 05/26/2017 12:06   Ir Thoracentesis Asp Pleural Space W/img Guide  Result Date: 05/27/2017 INDICATION: Patient with right pleural effusion. Request is made for diagnostic and therapeutic thoracentesis. EXAM: ULTRASOUND GUIDED DIAGNOSTIC AND THERAPEUTIC THORACENTESIS MEDICATIONS: 10 mL 2% lidocaine COMPLICATIONS: None immediate. PROCEDURE: An ultrasound guided thoracentesis was thoroughly discussed with the patient and questions answered. The benefits, risks, alternatives and complications were also discussed. The patient understands and wishes to proceed  with the procedure. Written consent was obtained. Ultrasound was performed to localize and mark an adequate pocket of fluid in the right chest. The area was then prepped and draped in the normal sterile fashion. 2% Lidocaine was used for local anesthesia. Under ultrasound guidance a Safe-T-Centesis catheter was introduced. Thoracentesis was performed. The catheter was removed and a dressing applied. FINDINGS: A total of approximately 2.0 liters of fluid was removed. Samples were sent to the laboratory as requested by the clinical team. Procedure was stopped prior to removal of all fluid as this was patient's first thoracentesis. IMPRESSION: Successful ultrasound guided right thoracentesis yielding 2.0 liters of pleural fluid. Read by: Brynda Greathouse PA-C Electronically Signed   By: Sandi Mariscal M.D.   On: 05/27/2017 16:30   Scheduled Meds: . carvedilol  6.25 mg Oral BID WC  . famotidine  20 mg Oral QHS  . furosemide  20 mg Oral Daily  . hydrALAZINE  12.5 mg Oral BID AC  . ipratropium-albuterol  3 mL Nebulization Q6H  . isosorbide mononitrate  15 mg Oral QHS  . [START ON 05/28/2017] lactose free nutrition  237 mL Oral TID WC  . lidocaine      . methimazole  10 mg Oral BID  . pantoprazole  40 mg Oral QAC breakfast   Continuous Infusions:   LOS: 1 day   Kerney Elbe, DO [facility] Pager [phone]  If 7PM-7AM, please contact night-coverage [email or web address] Password [facility] 05/27/2017, 5:13 PM

## 2017-05-27 NOTE — Progress Notes (Signed)
Echocardiogram 2D Echocardiogram has been performed.  [name] 05/27/2017, 3:36 PM

## 2017-05-27 NOTE — Progress Notes (Signed)
Notified Dr. Alfredia Ferguson of pt's pain to his right side from breathing. Gave tylenol to patient. Will continue to monitor.

## 2017-05-27 NOTE — Consult Note (Signed)
   [facility] Inpatient Consult   05/27/2017  MICKEY ESGUERRA 24-Feb-1933 [id number]  Patient was assessed for re-admission in the [facility].  Patient had been engaged with [facility] in the past.  Came by to speak with the patient and he was in patient care for personal care.  Will follow for disposition and needs.    Natividad Brood, RN BSN [facility] Liaison  [phone] business mobile phone Toll free office [phone]

## 2017-05-27 NOTE — Progress Notes (Signed)
Notified Dr Alfredia Ferguson and cardiology that patient had a 9 beat run of vtach.

## 2017-05-27 NOTE — Procedures (Signed)
PROCEDURE SUMMARY:  Successful US guided right diagnostic and therapeutic thoracentesis. Yielded 2.0 liters of clear, yellow fluid. Pt tolerated procedure well. No immediate complications.  Specimen was sent for labs. CXR ordered.  Docia Barrier PA-C 05/27/2017 4:26 PM

## 2017-05-27 NOTE — Progress Notes (Signed)
Initial Nutrition Assessment  DOCUMENTATION CODES:   Severe malnutrition in context of chronic illness  INTERVENTION:   -D/c Ensure Enlive po BID, each supplement provides 350 kcal and 20 grams of protein -Boost Plus TID, each supplement provides 360 kcals and 16 grams protein  NUTRITION DIAGNOSIS:   Severe Malnutrition related to chronic illness(CHF, COPD) as evidenced by energy intake < 75% for > or equal to 1 month, moderate fat depletion, severe fat depletion, moderate muscle depletion, severe muscle depletion, percent weight loss.  GOAL:   Patient will meet greater than or equal to 90% of their needs  MONITOR:   PO intake, Supplement acceptance, Labs, Weight trends, Skin, I & O's  REASON FOR ASSESSMENT:   Malnutrition Screening Tool    ASSESSMENT:   [name] is an 84 y.o. male with past medical history significant for cardiomyopathy with an EF of 25% with ICD in place, atrial fibrillation, COPD, status post AVR, postural hypotension, DM who was discharged 15 days ago when he presented for for increasing weakness, weight loss and shortness of breath.  Pt admitted with CHF.   Spoke with pt at bedside, who reports a generalized decline in health over the past 2 months. He reports that he no longer has the desire to eat, however, consumes 2 very small meals per days (ex a small bowl of cheerios). Pt also complains of early satiety. Pt shares that he has started consuming 2 Ensure or Boost supplements daily. Pt consumed 100% of pot roast and a cup of pudding for lunch.   Pt has experienced a 11.9% wt loss over the past 3 months, which is significant for time frame. He reports UBW is around 200#.   Pt shares he has become significantly weaker over the past 2 months. He shares he is very active and independent at baseline and "only moves from chair to chair".   Discussed importance of good meal and supplement intake to promote healing. Pt consumed an Ensure supplement,  but prefers chocolate Boost supplements. Encouraged pt to continue supplements at home (children assist with meal prep and purchasing supplements for pt).   Labs reviewed.   NUTRITION - FOCUSED PHYSICAL EXAM:    Most Recent Value  Orbital Region  Severe depletion  Upper Arm Region  Moderate depletion  Thoracic and Lumbar Region  Mild depletion  Buccal Region  Moderate depletion  Temple Region  Severe depletion  Clavicle Bone Region  Severe depletion  Clavicle and Acromion Bone Region  Moderate depletion  Scapular Bone Region  Severe depletion  Dorsal Hand  Severe depletion  Patellar Region  Mild depletion  Anterior Thigh Region  Mild depletion  Posterior Calf Region  Mild depletion  Edema (RD Assessment)  Mild  Hair  Reviewed  Eyes  Reviewed  Mouth  Reviewed  Skin  Reviewed  Nails  Reviewed       Diet Order:  DIET SOFT Room service appropriate? Yes; Fluid consistency: Thin  EDUCATION NEEDS:   Education needs have been addressed  Skin:  Skin Assessment: Reviewed RN Assessment  Last BM:  05/26/17  Height:   Ht Readings from Last 1 Encounters:  05/26/17 6' (1.829 m)    Weight:   Wt Readings from Last 1 Encounters:  05/27/17 184 lb 12.8 oz (83.8 kg)    Ideal Body Weight:  80.9 kg  BMI:  Body mass index is 25.06 kg/m.  Estimated Nutritional Needs:   Kcal:  2000-2200  Protein:  105-120 grams  Fluid:  2.0-2.2 L    Margee Trentham A. Jimmye Norman, RD, LDN, CDE Pager: [phone] After hours Pager: [phone]

## 2017-05-27 NOTE — Telephone Encounter (Signed)
Pts daughter is calling about pts MRI that he had done on his tyroid.   States they have no heard anything back from [place] on the results of this.    Please advise

## 2017-05-27 NOTE — Telephone Encounter (Signed)
Please advise on below

## 2017-05-27 NOTE — Progress Notes (Signed)
Progress Note  Patient Name: [name] Date of Encounter: 05/27/2017  Primary Cardiologist:   [name], MD   Subjective   He is very weak and SOB.    Inpatient Medications    Scheduled Meds: . apixaban  2.5 mg Oral BID  . atorvastatin  20 mg Oral q1800  . carvedilol  6.25 mg Oral BID WC  . famotidine  20 mg Oral QHS  . furosemide  20 mg Oral Daily  . hydrALAZINE  12.5 mg Oral BID AC  . ipratropium-albuterol  3 mL Nebulization Q6H  . isosorbide mononitrate  15 mg Oral QHS  . methimazole  10 mg Oral BID  . pantoprazole  40 mg Oral QAC breakfast   Continuous Infusions:  PRN Meds: acetaminophen, ondansetron **OR** ondansetron (ZOFRAN) IV   Vital Signs    Vitals:   05/26/17 2130 05/27/17 0131 05/27/17 0614 05/27/17 0738  BP: (!) 166/89  (!) 145/75 (!) 158/91  Pulse: 94  81 78  Resp: (!) _0 Temp: 97.7 F (36.5 C)  97.8 F (36.6 C) 97.9 F (36.6 C)  TempSrc: Oral  Oral Oral  SpO2: 96% 94% 95% 96%  Weight: 185 lb (83.9 kg)  184 lb 12.8 oz (83.8 kg)   Height: 6' (1.829 m)       Intake/Output Summary (Last 24 hours) at 05/27/2017 0843 Last data filed at 05/26/2017 2130 Gross per 24 hour  Intake 240 ml  Output -  Net 240 ml   Filed Weights   05/26/17 1158 05/26/17 2130 05/27/17 0614  Weight: 184 lb (83.5 kg) 185 lb (83.9 kg) 184 lb 12.8 oz (83.8 kg)    Telemetry    Ventricular pacing with runs of Vtach.   - Personally Reviewed  ECG    NA - Personally Reviewed  Physical Exam   GEN:  Weak with tachypnea Neck: Positive JVD Cardiac: RRR, no murmurs, rubs, or gallops.  Respiratory:   Decreased breath sounds bilaterally. GI: Soft, nontender, non-distended  MS: Moderate edema; No deformity. Neuro:  Nonfocal  Psych: Normal affect   Labs    Chemistry Recent Labs  Lab 05/26/17 1226 05/27/17 0617  NA 143 145  K 3.6 3.6  CL 106 108  CO2 22 23  GLUCOSE 146* 95  BUN 31* 31*  CREATININE 1.47* 1.50*  CALCIUM 8.5* 8.4*  PROT 5.8*  5.8*  ALBUMIN 2.9* 2.8*  AST 23 32  ALT 21 22  ALKPHOS 61 56  BILITOT 0.9 0.9  GFRNONAA 43* 42*  GFRAA 49* 48*  ANIONGAP 15 14     Hematology Recent Labs  Lab 05/26/17 1226  WBC 9.8  RBC 4.56  HGB 12.8*  HCT 40.0  MCV 87.7  MCH 28.1  MCHC 32.0  RDW 14.3  PLT 376    Cardiac Enzymes Recent Labs  Lab 05/26/17 1226 05/26/17 1812 05/26/17 2258 05/27/17 0617  TROPONINI 0.11* 0.13* 0.10* 0.10*   No results for input(s): TROPIPOC in the last 168 hours.   BNP Recent Labs  Lab 05/26/17 1226  BNP >4,500.0*     DDimer No results for input(s): DDIMER in the last 168 hours.   Radiology    Ct Chest Wo Contrast  Addendum Date: 05/26/2017   ADDENDUM REPORT: 05/26/2017 19:42 ADDENDUM: High density sludge or stones in the gallbladder Electronically Signed   By: Donavan Foil M.D.   On: 05/26/2017 19:42   Result Date: 05/26/2017 CLINICAL DATA:  Persistent EXAM: CT CHEST WITHOUT CONTRAST  TECHNIQUE: Multidetector CT imaging of the chest was performed following the standard protocol without IV contrast. COMPARISON:  Radiograph 05/26/2017, CT chest 05/08/2017, 03/05/2017, 05/08/2016 FINDINGS: Cardiovascular: Limited evaluation without intravenous contrast. Moderate-to-marked aortic atherosclerosis. No aneurysmal dilatation. Post CABG changes. Coronary artery calcification. Partially visualized cardiac pacing leads. Dense mitral calcification. Aortic valve replacement. Mild cardiomegaly. No significant pericardial effusion Mediastinum/Nodes: Midline trachea. No thyroid mass. Stable multiple slightly enlarged lymph nodes, for example AP window lymph node measures 15 mm. Low right paratracheal lymph node measures 1 cm. Esophagus within normal limits. Lungs/Pleura: Moderate to large right-sided pleural effusion with mild loculation along the pulmonary fissure. Small left-sided pleural effusion, both are increased since the prior CT. Mild emphysema. Progressive consolidation at the right lower  lobe. Passive atelectasis in the left lower lobe. Scarring in the left apex. Small upper lobe lung nodules similar compared to most recent prior CT. Upper Abdomen: Diffuse increased density in the gallbladder. Musculoskeletal: Post sternotomy changes. Degenerative changes of the spine. No acute or suspicious lesion. IMPRESSION: 1. Moderate large right pleural effusion and small left pleural effusion, both increased compared to prior CT. Progression in consolidation at the right lower lobe which may reflect worsening atelectasis or pneumonia 2. Stable mediastinal adenopathy, possibly reactive 3. Mild emphysema. 4. Small upper lobe lung nodules, see prior CT report 03/05/2017 and 05/08/2016 for follow-up recommendation. Aortic Atherosclerosis (ICD10-I70.0) and Emphysema (ICD10-J43.9). Electronically Signed: By: Donavan Foil M.D. On: 05/26/2017 18:54   Dg Chest Port 1 View  Result Date: 05/26/2017 CLINICAL DATA:  Shortness of breath and weakness. EXAM: PORTABLE CHEST 1 VIEW COMPARISON:  CT chest and chest x-ray dated May 08, 2017. FINDINGS: Unchanged left chest wall AICD. Stable cardiomegaly. Normal pulmonary vascularity. Interval increase in size of now moderate right pleural effusion with adjacent right basilar airspace disease. The left lung is clear. No pneumothorax. No acute osseous abnormality. IMPRESSION: 1. Interval increase in size of now moderate right pleural effusion with right basilar airspace disease, favored to reflect atelectasis. Electronically Signed   By: Titus Dubin M.D.   On: 05/26/2017 12:06    Cardiac Studies   ECHO:  Pending  Patient Profile     84 y.o. male with multiple medica problems.  History of AVR/CABG,  CM with CRTD.  Recent hyperthyroidism on amiodarone.  We are called to see for acute on chronic CHF.    Assessment & Plan    AVR:  History of pericardial tissue AVR.   Echo pending.   CAD:    History of CABG.   No evidence of active acute coronary syndrome.    ACUTE ON CHRONIC SYSTOLIC HF:  CRT - D.    He has volume overload with pleural effusion on CXR.  Repeat echo pending.  I agree with thoracentesis which could be scheduled tomorrow by the primary team.  I have held the Eliquis in anticipation.    He might need right heart cath and inotropes but will defer for now and discuss with advanced HF.   THYROID:  Managed per the primary team with increased methimazole.  Per Dr. Caryl Comes the plan is to change his beta blocker.    PAF:  No evidence of recurrent atrial fib on tele.  There is VTach  Device check pending.    WEAKNESS:   Check orthostatics.  I am going to get rid of any med that is not essential for now including his statin.   He is has protein calorie malnutrition.    For questions or  updates, please contact [place] [facility] Please consult [email or web address] for contact info under Cardiology/STEMI.   Signed, Minus Breeding, MD  05/27/2017, 8:43 AM

## 2017-05-27 NOTE — Progress Notes (Signed)
Notified cardiology that patient had run of vtach

## 2017-05-28 ENCOUNTER — Inpatient Hospital Stay (HOSPITAL_COMMUNITY): Payer: Medicare HMO

## 2017-05-28 ENCOUNTER — Telehealth: Payer: Self-pay | Admitting: Internal Medicine

## 2017-05-28 ENCOUNTER — Other Ambulatory Visit: Payer: Self-pay | Admitting: Internal Medicine

## 2017-05-28 LAB — COMPREHENSIVE METABOLIC PANEL
ALT: 21 U/L (ref 17–63)
ANION GAP: 12 (ref 5–15)
AST: 21 U/L (ref 15–41)
Albumin: 2.5 g/dL — ABNORMAL LOW (ref 3.5–5.0)
Alkaline Phosphatase: 54 U/L (ref 38–126)
BILIRUBIN TOTAL: 0.9 mg/dL (ref 0.3–1.2)
BUN: 31 mg/dL — AB (ref 6–20)
CO2: 23 mmol/L (ref 22–32)
Calcium: 8.2 mg/dL — ABNORMAL LOW (ref 8.9–10.3)
Chloride: 105 mmol/L (ref 101–111)
Creatinine, Ser: 1.37 mg/dL — ABNORMAL HIGH (ref 0.61–1.24)
GFR calc Af Amer: 54 mL/min — ABNORMAL LOW (ref >60)
GFR calc non Af Amer: 46 mL/min — ABNORMAL LOW (ref >60)
Glucose, Bld: 110 mg/dL — ABNORMAL HIGH (ref 65–99)
POTASSIUM: 3.9 mmol/L (ref 3.5–5.1)
SODIUM: 140 mmol/L (ref 135–145)
TOTAL PROTEIN: 5.4 g/dL — AB (ref 6.5–8.1)

## 2017-05-28 LAB — GLUCOSE, CAPILLARY
GLUCOSE-CAPILLARY: 158 mg/dL — AB (ref 65–99)
GLUCOSE-CAPILLARY: 137 mg/dL — AB (ref 65–99)
GLUCOSE-CAPILLARY: 119 mg/dL — AB (ref 65–99)

## 2017-05-28 LAB — CBC WITH DIFFERENTIAL/PLATELET
BASOS ABS: 0 10*3/uL (ref 0.0–0.1)
BASOS PCT: 0 %
EOS ABS: 0.2 10*3/uL (ref 0.0–0.7)
Eosinophils Relative: 2 %
HEMATOCRIT: 40.4 % (ref 39.0–52.0)
HEMOGLOBIN: 12.7 g/dL — AB (ref 13.0–17.0)
Lymphocytes Relative: 21 %
Lymphs Abs: 2.5 10*3/uL (ref 0.7–4.0)
MCH: 27.6 pg (ref 26.0–34.0)
MCHC: 31.4 g/dL (ref 30.0–36.0)
MCV: 87.8 fL (ref 78.0–100.0)
MONO ABS: 1.9 10*3/uL — AB (ref 0.1–1.0)
Monocytes Relative: 16 %
NEUTROS ABS: 7.2 10*3/uL (ref 1.7–7.7)
NEUTROS PCT: 61 %
Platelets: 320 10*3/uL (ref 150–400)
RBC: 4.6 MIL/uL (ref 4.22–5.81)
RDW: 14.5 % (ref 11.5–15.5)
WBC: 11.8 10*3/uL — ABNORMAL HIGH (ref 4.0–10.5)

## 2017-05-28 LAB — GRAM STAIN

## 2017-05-28 LAB — MAGNESIUM: Magnesium: 2 mg/dL (ref 1.7–2.4)

## 2017-05-28 LAB — PHOSPHORUS: Phosphorus: 3.3 mg/dL (ref 2.5–4.6)

## 2017-05-28 LAB — PH, BODY FLUID: PH, BODY FLUID: 7.9

## 2017-05-28 MED ORDER — IPRATROPIUM-ALBUTEROL 0.5-2.5 (3) MG/3ML IN SOLN: 3.0000 mL | Freq: Four times a day (QID) | RESPIRATORY_TRACT | Status: DC | PRN

## 2017-05-28 MED ORDER — HYDRALAZINE HCL 25 MG PO TABS
12.5000 mg | ORAL_TABLET | Freq: Three times a day (TID) | ORAL | Status: DC
  Administered 2017-05-28 – 2017-06-01 (×13): 12.5 mg via ORAL
  Filled 2017-05-28 (×14): qty 1

## 2017-05-28 MED ORDER — PREDNISONE 10 MG PO TABS: 20.0000 mg | ORAL_TABLET | Freq: Every day | ORAL | 3 refills | Status: DC

## 2017-05-28 MED ORDER — APIXABAN 2.5 MG PO TABS
2.5000 mg | ORAL_TABLET | Freq: Two times a day (BID) | ORAL | Status: DC
  Administered 2017-05-28 – 2017-05-30 (×5): 2.5 mg via ORAL
  Filled 2017-05-28 (×5): qty 1

## 2017-05-28 MED ORDER — IOPAMIDOL (ISOVUE-300) INJECTION 61%
INTRAVENOUS | Status: AC
  Filled 2017-05-28: qty 30

## 2017-05-28 MED ORDER — ISOSORBIDE MONONITRATE ER 30 MG PO TB24
30.0000 mg | ORAL_TABLET | Freq: Every day | ORAL | Status: DC
  Administered 2017-05-28 – 2017-06-03 (×7): 30 mg via ORAL
  Filled 2017-05-28 (×7): qty 1

## 2017-05-28 MED ORDER — PROMETHAZINE HCL 25 MG PO TABS: 12.5000 mg | ORAL_TABLET | Freq: Three times a day (TID) | ORAL | Status: DC | PRN

## 2017-05-28 NOTE — Progress Notes (Signed)
Pt had 8 beats of VTach. Pt is asymptomatic. Will continue to monitor pt.

## 2017-05-28 NOTE — Telephone Encounter (Signed)
Dr. Cruzita Lederer has not filled any RX's for pt at this time. Called and pt is in the hospital and wife is unaware of who may have called for pt. Wife nor daugther are on a HIPAA form updated in the chart so unable to discuss anything with them.

## 2017-05-28 NOTE — Telephone Encounter (Signed)
Patient is requesting Rx to be sent to [facility]. She had originally asked for it to be sent to [place].

## 2017-05-28 NOTE — Evaluation (Signed)
Physical Therapy Evaluation Patient Details Name: [name] MRN: [id number] DOB: [birth year] Today's Date: 05/28/2017   History of Present Illness  Pt is an 84 y.o. male admitted 05/26/17 with c/o weakness, fatigue, and abdominal discomfort; worked up for CHF exacerbation. CXR showed pleural effusion. S/p thoracentesis 2/18. PMH includes AICD placement, CABG (2009), AVR, HTN, DM, CKD, arthritis. Of note, admitted 2 weeks ago with c/o weakness; was d/c home with HHPT services.    Clinical Impression  Pt presents with an overall decrease in functional mobility secondary to above. PTA, pt lives at home with wife who is not able to provide consistent physical assist. Over past few weeks, has required increasing assist from children for ADLs; only able to amb very short household distances secondary to fatigue and SOB. Today, pt able to ambulate short distance with RW; required 2x seated rest break secondary to fatigue/SOB. SpO2 >91% on RA. Pt would benefit from short-term SNF-level therapies to maximize functional mobility and decrease caregiver burden. Recommend rollator for stability and energy conservation. Will follow acutely to address established goals.     Follow Up Recommendations SNF    Equipment Recommendations  (rollator)    Recommendations for Other Services OT consult     Precautions / Restrictions Precautions Precautions: Fall Restrictions Weight Bearing Restrictions: No      Mobility  Bed Mobility Overal bed mobility: Needs Assistance Bed Mobility: Supine to Sit     Supine to sit: Supervision;HOB elevated        Transfers Overall transfer level: Needs assistance Equipment used: Rolling walker (2 wheeled) Transfers: Sit to/from Stand Sit to Stand: Supervision         General transfer comment: Increased time and effort secondary to fatigue. Min guard with no UE support. Supervision with RW and educ on hand placement  Ambulation/Gait Ambulation/Gait  assistance: Min guard Ambulation Distance (Feet): 40 Feet Assistive device: Rolling walker (2 wheeled) Gait Pattern/deviations: Step-through pattern;Decreased stride length Gait velocity: Decreased Gait velocity interpretation: <1.8 ft/sec, indicative of risk for recurrent falls General Gait Details: Slow, unsteady amb with RW and min guard for balance. 2x seated rest break secondary to fatigue and SOB. SpO2 >91% on RA throughout. Pt c/o he cannot take deep breath since thoracentesis yesterday  Stairs            Wheelchair Mobility    Modified Rankin (Stroke Patients Only)       Balance Overall balance assessment: Needs assistance   Sitting balance-Leahy Scale: Fair       Standing balance-Leahy Scale: Fair Standing balance comment: Can static stand with no UE support                             Pertinent Vitals/Pain Pain Assessment: Faces Faces Pain Scale: Hurts little more Pain Location: Abdominal tightness s/p thoracentesis Pain Descriptors / Indicators: Sore;Tightness Pain Intervention(s): Monitored during session;Limited activity within patient's tolerance    Home Living Family/patient expects to be discharged to:: Private residence Living Arrangements: Spouse/significant other Available Help at Discharge: Family;Available 24 hours/day;Other (Comment)(Wife health problems; children PRN) Type of Home: House Home Access: Ramped entrance;Stairs to enter Entrance Stairs-Rails: Right Entrance Stairs-Number of Steps: 1 Home Layout: One level Home Equipment: Cane - quad;Bedside commode;Grab bars - toilet;Grab bars - tub/shower Additional Comments: Wife available for 24/7 supervision but has own health issues and unable to provide complete assist. 3 children live nearby and provide PRN assist    Prior Function  Level of Independence: Needs assistance   Gait / Transfers Assistance Needed: Indep with household ambulation. Has to take seated rest break after  very short household distances  ADL's / Homemaking Assistance Needed: Requires assist for bathing. Home aide assisting with ADLs  Comments: Wife needs assist as well     Hand Dominance        Extremity/Trunk Assessment   Upper Extremity Assessment Upper Extremity Assessment: Generalized weakness    Lower Extremity Assessment Lower Extremity Assessment: Generalized weakness       Communication   Communication: No difficulties  Cognition Arousal/Alertness: Awake/alert Behavior During Therapy: Flat affect Overall Cognitive Status: Within Functional Limits for tasks assessed                                        General Comments General comments (skin integrity, edema, etc.): Daughter present during session    Exercises     Assessment/Plan    PT Assessment Patient needs continued PT services  PT Problem List Decreased strength;Decreased activity tolerance;Decreased mobility;Cardiopulmonary status limiting activity;Decreased balance       PT Treatment Interventions DME instruction;Gait training;Stair training;Functional mobility training;Therapeutic activities;Therapeutic exercise;Balance training;Patient/family education    PT Goals (Current goals can be found in the Care Plan section)  Acute Rehab PT Goals Patient Stated Goal: Get stronger PT Goal Formulation: With patient/family Time For Goal Achievement: 06/11/17 Potential to Achieve Goals: Good    Frequency Min 2X/week   Barriers to discharge Decreased caregiver support      Co-evaluation               AM-PAC PT "6 Clicks" Daily Activity  Outcome Measure Difficulty turning over in bed (including adjusting bedclothes, sheets and blankets)?: A Little Difficulty moving from lying on back to sitting on the side of the bed? : A Little Difficulty sitting down on and standing up from a chair with arms (e.g., wheelchair, bedside commode, etc,.)?: A Little Help needed moving to and from a  bed to chair (including a wheelchair)?: A Little Help needed walking in hospital room?: A Little Help needed climbing 3-5 steps with a railing? : A Lot 6 Click Score: 17    End of Session Equipment Utilized During Treatment: Gait belt Activity Tolerance: Patient tolerated treatment well;Patient limited by fatigue Patient left: in chair;with call bell/phone within reach;with family/visitor present Nurse Communication: Mobility status PT Visit Diagnosis: Other abnormalities of gait and mobility (R26.89);Muscle weakness (generalized) (M62.81)    Time: 9735-3299 PT Time Calculation (min) (ACUTE ONLY): 24 min   Charges:   PT Evaluation $PT Eval Moderate Complexity: 1 Mod PT Treatments $Gait Training: 8-22 mins   PT G Codes:       Mabeline Caras, PT, DPT Acute Rehab Services  Pager: [place] 05/28/2017, 9:23 AM

## 2017-05-28 NOTE — Clinical Social Work Note (Signed)
Clinical Social Work Assessment  Patient Details  Name: [name] MRN: [id number] Date of Birth: 07-17-1932  Date of referral:  05/28/17               Reason for consult:  Facility Placement, Discharge Planning                Permission sought to share information with:    Permission granted to share information::  No  Name::        Agency::     Relationship::     Contact Information:     Housing/Transportation Living arrangements for the past 2 months:  Single Family Home Source of Information:  Patient, Medical Team Patient Interpreter Needed:  None Criminal Activity/Legal Involvement Pertinent to Current Situation/Hospitalization:  No - Comment as needed Significant Relationships:  Spouse, Adult Children Lives with:  Spouse Do you feel safe going back to the place where you live?  Yes Need for family participation in patient care:  Yes (Comment)  Care giving concerns:  PT recommending SNF once medically stable for discharge.   Social Worker assessment / plan:  CSW met with patient. No supports at bedside. CSW introduced role and explained that PT recommendations would be discussed. Patient prefers to return home with home health rather than SNF. He states he is already getting therapy through [facility]. Patient states he feels safe returning home. RNCM notified. CSW signing off as social work intervention is no longer needed.  Employment status:  Retired Nurse, adult PT Recommendations:  [place] / Referral to community resources:  [place]  Patient/Family's Response to care:  Patient prefers to return home with home health. Patient's family supportive and involved in patient's care. Patient appreciated social work intervention.  Patient/Family's Understanding of and Emotional Response to Diagnosis, Current Treatment, and Prognosis:  Patient has a good understanding of the reason for  admission and need for continued therapy after discharge. Patient appears happy with hospital care.  Emotional Assessment Appearance:  Appears stated age Attitude/Demeanor/Rapport:  Engaged, Gracious Affect (typically observed):  Accepting, Appropriate, Calm, Pleasant Orientation:  Oriented to Self, Oriented to Place, Oriented to  Time, Oriented to Situation Alcohol / Substance use:  Never Used Psych involvement (Current and /or in the community):  No (Comment)  Discharge Needs  Concerns to be addressed:  Care Coordination Readmission within the last 30 days:  Yes Current discharge risk:  Dependent with Mobility Barriers to Discharge:  Continued Medical Work up   Candie Chroman, LCSW 05/28/2017, 12:17 PM

## 2017-05-28 NOTE — Progress Notes (Signed)
Pt has began to drink contrast for CT

## 2017-05-28 NOTE — Progress Notes (Signed)
Progress Note  Patient Name: [name] Date of Encounter: 05/28/2017  Primary Cardiologist:   [name], MD   Subjective   He is weak and nauseated.  No pain.  No significant SOB  Inpatient Medications    Scheduled Meds: . carvedilol  6.25 mg Oral BID WC  . famotidine  20 mg Oral QHS  . furosemide  20 mg Oral Daily  . hydrALAZINE  12.5 mg Oral BID AC  . isosorbide mononitrate  15 mg Oral QHS  . lactose free nutrition  237 mL Oral TID WC  . methimazole  10 mg Oral BID  . metoprolol tartrate  2.5 mg Intravenous Once  . pantoprazole  40 mg Oral QAC breakfast   Continuous Infusions:  PRN Meds: acetaminophen, ipratropium-albuterol, ondansetron **OR** ondansetron (ZOFRAN) IV, oxyCODONE, traMADol   Vital Signs    Vitals:   05/27/17 1648 05/27/17 1701 05/27/17 1930 05/28/17 0456  BP:  120/64 132/68 136/78  Pulse:  75  78  Resp:  18 (!) 28 (!) 24  Temp:  98 F (36.7 C) 97.7 F (36.5 C) 97.8 F (36.6 C)  TempSrc:  Oral Oral Oral  SpO2: 94% 94% 98% 96%  Weight:    183 lb 8 oz (83.2 kg)  Height:        Intake/Output Summary (Last 24 hours) at 05/28/2017 0739 Last data filed at 05/28/2017 0500 Gross per 24 hour  Intake 360 ml  Output 870 ml  Net -510 ml   Filed Weights   05/26/17 2130 05/27/17 0614 05/28/17 0456  Weight: 185 lb (83.9 kg) 184 lb 12.8 oz (83.8 kg) 183 lb 8 oz (83.2 kg)    Telemetry    Runs of ventricular tachycardia.   - Personally Reviewed  ECG    NA - Personally Reviewed  Physical Exam   GEN: No  acute distress.   Neck: No  JVD Cardiac: RRR, no murmurs, rubs, or gallops.  Respiratory:   Improved breath sounds at the right base GI: Soft, nontender, non-distended, normal bowel sounds  MS:  No edema; No deformity. Neuro:   Nonfocal  Psych: Oriented and appropriate    Labs    Chemistry Recent Labs  Lab 05/26/17 1226 05/27/17 0617 05/28/17 0445  NA 143 145 140  K 3.6 3.6 3.9  CL 106 108 105  CO2 _0 GLUCOSE 146*  95 110*  BUN 31* 31* 31*  CREATININE 1.47* 1.50* 1.37*  CALCIUM 8.5* 8.4* 8.2*  PROT 5.8* 5.8* 5.4*  ALBUMIN 2.9* 2.8* 2.5*  AST 23 32 21  ALT _1 ALKPHOS 61 56 54  BILITOT 0.9 0.9 0.9  GFRNONAA 43* 42* 46*  GFRAA 49* 48* 54*  ANIONGAP _2 Hematology Recent Labs  Lab 05/26/17 1226 05/27/17 0617 05/28/17 0445  WBC 9.8 9.1 11.8*  RBC 4.56 4.70 4.60  HGB 12.8* 13.4 12.7*  HCT 40.0 41.7 40.4  MCV 87.7 88.7 87.8  MCH 28.1 28.5 27.6  MCHC 32.0 32.1 31.4  RDW 14.3 14.7 14.5  PLT 376 322 320    Cardiac Enzymes Recent Labs  Lab 05/26/17 1226 05/26/17 1812 05/26/17 2258 05/27/17 0617  TROPONINI 0.11* 0.13* 0.10* 0.10*   No results for input(s): TROPIPOC in the last 168 hours.   BNP Recent Labs  Lab 05/26/17 1226  BNP >4,500.0*     DDimer No results for input(s): DDIMER in the last 168 hours.   Radiology  Dg Chest 1 View  Result Date: 05/27/2017 CLINICAL DATA:  Shortness of breath. Pleural effusions. Status post right thoracentesis. EXAM: CHEST 1 VIEW COMPARISON:  Chest x-ray and CT scan dated 05/26/2017 FINDINGS: There has been marked reduction in the size of the right pleural effusion. There is a small right residual effusion and a small left effusion. Cardiomegaly. CABG. AICD in place. Pulmonary vascularity is normal. Aortic atherosclerosis. IMPRESSION: No pneumothorax after right thoracentesis. Small residual right effusion.  Small left effusion is unchanged. Aortic Atherosclerosis (ICD10-I70.0). Electronically Signed   By: Lorriane Shire M.D.   On: 05/27/2017 14:43   Ct Chest Wo Contrast  Addendum Date: 05/26/2017   ADDENDUM REPORT: 05/26/2017 19:42 ADDENDUM: High density sludge or stones in the gallbladder Electronically Signed   By: Donavan Foil M.D.   On: 05/26/2017 19:42   Result Date: 05/26/2017 CLINICAL DATA:  Persistent EXAM: CT CHEST WITHOUT CONTRAST TECHNIQUE: Multidetector CT imaging of the chest was performed following the standard  protocol without IV contrast. COMPARISON:  Radiograph 05/26/2017, CT chest 05/08/2017, 03/05/2017, 05/08/2016 FINDINGS: Cardiovascular: Limited evaluation without intravenous contrast. Moderate-to-marked aortic atherosclerosis. No aneurysmal dilatation. Post CABG changes. Coronary artery calcification. Partially visualized cardiac pacing leads. Dense mitral calcification. Aortic valve replacement. Mild cardiomegaly. No significant pericardial effusion Mediastinum/Nodes: Midline trachea. No thyroid mass. Stable multiple slightly enlarged lymph nodes, for example AP window lymph node measures 15 mm. Low right paratracheal lymph node measures 1 cm. Esophagus within normal limits. Lungs/Pleura: Moderate to large right-sided pleural effusion with mild loculation along the pulmonary fissure. Small left-sided pleural effusion, both are increased since the prior CT. Mild emphysema. Progressive consolidation at the right lower lobe. Passive atelectasis in the left lower lobe. Scarring in the left apex. Small upper lobe lung nodules similar compared to most recent prior CT. Upper Abdomen: Diffuse increased density in the gallbladder. Musculoskeletal: Post sternotomy changes. Degenerative changes of the spine. No acute or suspicious lesion. IMPRESSION: 1. Moderate large right pleural effusion and small left pleural effusion, both increased compared to prior CT. Progression in consolidation at the right lower lobe which may reflect worsening atelectasis or pneumonia 2. Stable mediastinal adenopathy, possibly reactive 3. Mild emphysema. 4. Small upper lobe lung nodules, see prior CT report 03/05/2017 and 05/08/2016 for follow-up recommendation. Aortic Atherosclerosis (ICD10-I70.0) and Emphysema (ICD10-J43.9). Electronically Signed: By: Donavan Foil M.D. On: 05/26/2017 18:54   Dg Chest Port 1 View  Result Date: 05/26/2017 CLINICAL DATA:  Shortness of breath and weakness. EXAM: PORTABLE CHEST 1 VIEW COMPARISON:  CT chest and  chest x-ray dated May 08, 2017. FINDINGS: Unchanged left chest wall AICD. Stable cardiomegaly. Normal pulmonary vascularity. Interval increase in size of now moderate right pleural effusion with adjacent right basilar airspace disease. The left lung is clear. No pneumothorax. No acute osseous abnormality. IMPRESSION: 1. Interval increase in size of now moderate right pleural effusion with right basilar airspace disease, favored to reflect atelectasis. Electronically Signed   By: Titus Dubin M.D.   On: 05/26/2017 12:06   Ir Thoracentesis Asp Pleural Space W/img Guide  Result Date: 05/27/2017 INDICATION: Patient with right pleural effusion. Request is made for diagnostic and therapeutic thoracentesis. EXAM: ULTRASOUND GUIDED DIAGNOSTIC AND THERAPEUTIC THORACENTESIS MEDICATIONS: 10 mL 2% lidocaine COMPLICATIONS: None immediate. PROCEDURE: An ultrasound guided thoracentesis was thoroughly discussed with the patient and questions answered. The benefits, risks, alternatives and complications were also discussed. The patient understands and wishes to proceed with the procedure. Written consent was obtained. Ultrasound was performed to localize and mark  an adequate pocket of fluid in the right chest. The area was then prepped and draped in the normal sterile fashion. 2% Lidocaine was used for local anesthesia. Under ultrasound guidance a Safe-T-Centesis catheter was introduced. Thoracentesis was performed. The catheter was removed and a dressing applied. FINDINGS: A total of approximately 2.0 liters of fluid was removed. Samples were sent to the laboratory as requested by the clinical team. Procedure was stopped prior to removal of all fluid as this was patient's first thoracentesis. IMPRESSION: Successful ultrasound guided right thoracentesis yielding 2.0 liters of pleural fluid. Read by: Brynda Greathouse PA-C Electronically Signed   By: Sandi Mariscal M.D.   On: 05/27/2017 16:30    Cardiac Studies   ECHO:  05/27/17  Final results pending.  I reviewed the images personally.  Overall EF about 20% with severe global hypokinesis.    Patient Profile     84 y.o. male with multiple medica problems.  History of AVR/CABG,  CM with CRTD.  Recent hyperthyroidism on amiodarone.  We are called to see for acute on chronic CHF.    Assessment & Plan    AVR:  History of pericardial tissue AVR.   Valve appears to function well.    CAD:    History of CABG.   No evidence of active acute coronary syndrome.   ACUTE ON CHRONIC SYSTOLIC HF:  CRT - D.    He has volume overload with pleural effusion on CXR.  Had 2 liters of fluid removed via thoracentesis yesterday.  Resume Eliquis.   EF severely reduced as above.  Final reading is pending.  I will increase the hydral/nitrates as above.   He otherwise does not appear to have excessive volume overload.  Continue current diuresis.  He has good renal function and no evidence of hepatic congestion.   I am not convinced that low output failure is responsible for his entire decline.  I think that treatment with inotropes would be difficult and like to exacerbate arrhythmia with his hyperthyroidism.    THYROID:  Managed per the primary team with increased methimazole.   We discussed this with the pharmacy it is unlikely that changing from Coreg to other beta blocker with have a substantial effect on hyperthyroid status.    PAF:  CRT - D  (I do not see an interrogation yesterday.)  No evidence of recurrent atrial fib on tele.  There is VTach .    WEAKNESS:   No significant orthostatic drop.  Going back over many notes this has been a progressive problem with out a single defining etiology.  He has had progressive weight loss for weeks.  Some of the symptoms could be related to the hyperthyroidism noted and now being treated.  Also there is a nodule in the left upper lobe that needs to be evaluated.  Finally, end stage HF could cause anorexia and weight loss.  However, at this point I  am not inclined to start inotropic therapy with his Vtach.  I would suggest right heart cath prior to going this direction and I would talk this over with the advanced HF team prior to going this direction.    For questions or updates, please contact [place] Please consult [email or web address] for contact info under Cardiology/STEMI.   Signed, Minus Breeding, MD  05/28/2017, 7:39 AM

## 2017-05-28 NOTE — Progress Notes (Signed)
PROGRESS NOTE    [name]  IWL:[id number] DOB: February 18, 1933 DOA: 05/26/2017 PCP: [name], MD   Brief Narrative: [name] is an 84 y.o. male with past medical history significant for cardiomyopathy with an EF of 25% with ICD in place, atrial fibrillation, COPD, status post AVR, postural hypotension, DM who was discharged 15 days ago when he presented for for increasing weakness, weight loss and shortness of breath. Workup revealed hyperthyroidism which was thought to be secondary to amiodarone. Patient was started on methimazole and discharged home for follow-up with endocrinology.  Patient now presents stating that he did well for about 2 days after discharge and "it started getting bad again". Patient's main complaints are profound difficulty with ambulation. He describes a feeling of "feeling sick in my stomach" every time he tries to walk. He is also extremely short of breath with ambulation. Patient notes these symptoms have been present pretty much without change for the past 2 weeks however he came in today because his children forced him to come because he was unable to get off the couch because he was so weak. Patient does admit to some orthopnea which is also been going on for the past couple of weeks. Patient notes he is unable to walk across even a couple of steps due to discomfort in his abdominal area and shortness of breath.  He complains nausea and a discomfort and abdominal area almost all the time but again it's worse with any exertion. Patient denied any fevers or chills but does admit to sweats during the day which have been present for several months. These are been attributed to hyperthyroidism. He also notes a 20 pound weight loss over the past couple of weeks which he also mentioned on last admission last month. Patient also notes he's had several episodes of paroxysmal coughing such that he felt that he was cannot vomit. These have been occurring over the past  several weeks. These are entirely nonproductive without hemoptysis or any phlegm production. Admitted for Weakness and found to have a Right Pleural Effusion. Cardiology consulting. Patient underwent Thoracentesis yesterday. Still having Significant Nausea so obtaining a CT Abdomen and Pelvis and a GI consultation.    Assessment & Plan:   Principal Problem:   CHF (congestive heart failure), NYHA class IV, acute on chronic, combined ([place]) Active Problems:   Essential hypertension   CAD status post CABG   Persistent atrial fibrillation (HCC)   Hyperthyroidism   Nausea   Cough   Pleural effusion   Protein-calorie malnutrition, severe  GENERALIZED WEAKNESS/FATIGUE -Unclear Etiology.  -Certainly end-stage heart disease can cause anorexia, weight loss, shortness of breath orthopnea and pleural effusions especially given BNP 4500.  -However several of the symptoms could also be secondary to apathetic hyperthyroidism.  -Also of note patient has a new pleural effusion of unclear etiology which has worsened over the past month which may be secondary to hypothyroidism versus worsening heart failure.  -He is also noted to have a 6 mm subpleural left upper lobe nodule which was new on last admission. He may or may not have a mass hiding in there.  -Less Likely PE since he is anticoagulated on Eliquis -ECHOCARDIOGRAM done but pending read still -Ordered Thoracentesis for Pleural Effusion; Done yesterday revealing 2 Liters -Cardiology Consulting and appreciated; They are planning on checking Orthostatics and Discontinuing Statin  -May discuss with Patient's Primary Endocrinologist if we think this symptoms are from apathetic thyrotoxicosis.  ELEVATED TROPONIN -Patient received aspirin, will  continue carvedilol and atorvastatin -EKG is paced -Will trend troponins every 6H3; Went from 0.13 -> 0.10 -> 0.10 -Cardiology consultation appreciated -Checking ECHOCardiogram and done but pending read  today -Cardiology feels no evidence of ACS  RIGHT PLEURAL EFFUSION S/p Thoracentesis  -Held Eliquis and ordered IR Consult; IR now resumed -IR Drained 2.0 Liters off -Fluid Sent for Analysis  -Having Re-expansion Pain so started Tramadol   ABDOMINAL DISCOMFORT -Unclear whether abdominal discomfort that the patient is describing is secondary to an anginal equivalent or whether he has gastroparesis or whether this is from his GERD/gastritis. -Also of note, Cardiology stated mexiletine can be associated with significant GI discomfort.  -Will hold mexiletine while patient is in house to see if GI discomfort is resolved per his recommendations.  -May be gastroparesis and considering adding a promotility agent if further workup is negative. -Check CT Abd/Pelvis and it showed moderate B/L Pleural effusions with adjacent subsegmental atelectasis, sigmoid diverticulosis w/o inflammation and no acute abnormality seen in the Abd/Pelvis -Consulted GI Dr. Therisa Doyne for evaluation and recommendations -She has ordered a Gastric Emptying Scan and will try promethazine; May need Erythomycin/Reglan if cleared by Cardiology  HYPERTHYROIDISM -TSH was <0.010, T3 was 4.2, and Free T4 was 4.01 -C/w Methimazole 10 mg po BID -May discuss with Patient's Primary Endocrinologist Dr. Renne Crigler -Cardiology was considering changing BB but will keep for now  PAROXYSMAL COUGHING -Likely secondary to increasing pleural effusion -Check Respiratory Virus Panel   SOB -Multifactorial. Likely secondary to increasing restriction and lungs with increasing pleural effusion as well as poor cardiac output and in combination with new Lung Nodule.  -He also may have an element of mild pulmonary fibrosis from his amiodarone.  -He does have a history of COPD and likely has decreased lung tissue. May benefit from a trial of prednisone if other workup is negative. -Check ECHO; Ordered and pending  -C/w DuoNeb q6h -C/w Furosemide 20 mg  po Daily   HTN -Continue Carvedilol 6.25, Hydralazine 25 mg, Lasix 20 mg daily,          `  PAROXSYMAL ATRIAL FIBRILLATION -On Eliquis and mexiletine, rate is controlled on carvedilol -Previously on amiodarone however this was discontinued secondary to concerns about lung and thyroid function as noted above -Eliquis Held for Thoracentesis and and Mexiletine held because of GI discomfort  -Cardiology following and appreciate further recc's  S/P AORTIC VALVE REPLACEMENT -Tissue valve -Check ECHOCARDIOGRAM -Cardiology following and appreciate additional recommendations  Diabetes Mellitus Type 2 -Diet Controlled -Does not appear to be on any diabetic medications at home which may be secondary to his significant weight loss.  -Will check fingersticks before meals and at bedtime. -CBG's ranging from 119-137  GERD/Hx of Barrett Esophagus  -Will try omeprazole although GI discomfort; C/w Pantoprazole 40 mg po Daily  -may well be from mexiletine versus anginal equivalent as noted above.   Severe Malnutrition in the Context of Chronic Illness -Appreciated Nutritionist Evaluation and Consultation  -Ensure Enlive po BID D/C'd -C/w Boost Plus TID  CKD Stage 3 -BUN/Cr went from 31/1.47 -> 31/1.50 -> 31/1.37 -Avoid Nephrotoxins -Repeat CMP in AM   CAD s/p CABG -Cardiology following  -Discontinued his Statin   Acute on Chronic Systolic CHF -Cardiology following -C/w Coreg  -C/w Hydralazine and Imdur and cardiology increasing  -Repeat ECHOCardiogram pending -Strict I's/O's, Daily Weights, SLIV -Patient is -770 mL; Weight is essentially the same -On Lasix 20 mg po daily and will defer to Cardiology adjust but recommending continuing for now -Per  Dr. Warren Lacy may need Right Heart Cath and Ionotropes but he is discussing with Heart Failure team  DVT prophylaxis: Anticoagulated with Apixaban Code Status: FULL CODE Family Communication: Discussed with Daughter at  bedside Disposition Plan: Remain Inpatient  Consultants:   Cardiology  Interventional Radiology   Procedures:  Right Thoracentesis yielding 2.0 Liters of Clear, Yellow Fluid  ECHOCARDIOGRAM done and pending read  Antimicrobials:  Anti-infectives (From admission, onward)   None     Subjective: Seen and examined and states he had some pain from coughing. Still feeling nauseous and Abdomen hurts when he ambulates.   Objective: Vitals:   05/27/17 1930 05/28/17 0456 05/28/17 0848 05/28/17 1636  BP: 132/68 136/78 (!) 154/72 137/70  Pulse:  78 89 80  Resp: (!) 28 (!) 24 19   Temp: 97.7 F (36.5 C) 97.8 F (36.6 C)  98.1 F (36.7 C)  TempSrc: Oral Oral  Oral  SpO2: 98% 96% 96% 94%  Weight:  83.2 kg (183 lb 8 oz)    Height:        Intake/Output Summary (Last 24 hours) at 05/28/2017 1904 Last data filed at 05/28/2017 1142 Gross per 24 hour  Intake 120 ml  Output 670 ml  Net -550 ml   Filed Weights   05/26/17 2130 05/27/17 0614 05/28/17 0456  Weight: 83.9 kg (185 lb) 83.8 kg (184 lb 12.8 oz) 83.2 kg (183 lb 8 oz)   Examination: Physical Exam:  Constitutional: WN/WD Caucasian male in NAD Eyes: Sclerae anciteric. Lids normal ENMT:  External ears and nose appear normal. MMM Neck: Supple with no JVD Respiratory: Diminished to auscultations. Unlabored breathing  Cardiovascular: RRR; Trace LE edema Abdomen: Soft, Tender to palpate. Distended due to body habitus. Bowel sounds present GU: Deferred Musculoskeletal: No contractures; No cyanosis  Skin:  Warm and Dry; No appreciable rashes or ulcers on a limited skin eval Neurologic: CN 2-12 grossly intact. No appreciable focal deficits  Psychiatric: Depressed appearing mood. Flat affect. Intact judgement and insight  Data Reviewed: I have personally reviewed following labs and imaging studies  CBC: Recent Labs  Lab 05/26/17 1226 05/27/17 0617 05/28/17 0445  WBC 9.8 9.1 11.8*  NEUTROABS 6.3  --  7.2  HGB 12.8* 13.4  12.7*  HCT 40.0 41.7 40.4  MCV 87.7 88.7 87.8  PLT 376 322 655   Basic Metabolic Panel: Recent Labs  Lab 05/26/17 1226 05/27/17 0617 05/27/17 1846 05/28/17 0445  NA 143 145  --  140  K 3.6 3.6  --  3.9  CL 106 108  --  105  CO2 22 23  --  23  GLUCOSE 146* 95  --  110*  BUN 31* 31*  --  31*  CREATININE 1.47* 1.50*  --  1.37*  CALCIUM 8.5* 8.4*  --  8.2*  MG  --   --  2.1 2.0  PHOS  --   --   --  3.3   GFR: Estimated Creatinine Clearance: 45.6 mL/min (A) (by C-G formula based on SCr of 1.37 mg/dL (H)). Liver Function Tests: Recent Labs  Lab 05/26/17 1226 05/27/17 0617 05/28/17 0445  AST 23 32 21  ALT _0 ALKPHOS 61 56 54  BILITOT 0.9 0.9 0.9  PROT 5.8* 5.8* 5.4*  ALBUMIN 2.9* 2.8* 2.5*   No results for input(s): LIPASE, AMYLASE in the last 168 hours. No results for input(s): AMMONIA in the last 168 hours. Coagulation Profile: Recent Labs  Lab 05/26/17 1226  INR 1.65  Cardiac Enzymes: Recent Labs  Lab 05/26/17 1226 05/26/17 1812 05/26/17 2258 05/27/17 0617  TROPONINI 0.11* 0.13* 0.10* 0.10*   BNP (last 3 results) Recent Labs    04/18/17 1126 04/23/17 1048 05/06/17 1110  PROBNP 10,928* 10,848* 22,420*   HbA1C: No results for input(s): HGBA1C in the last 72 hours. CBG: Recent Labs  Lab 05/27/17 1110 05/27/17 1613 05/27/17 2104 05/28/17 1144 05/28/17 1640  GLUCAP 182* 158* 119* 137* 119*   Lipid Profile: No results for input(s): CHOL, HDL, LDLCALC, TRIG, CHOLHDL, LDLDIRECT in the last 72 hours. Thyroid Function Tests: Recent Labs    05/26/17 1233  TSH <0.010*  FREET4 4.01*  T3FREE 4.2   Anemia Panel: No results for input(s): VITAMINB12, FOLATE, FERRITIN, TIBC, IRON, RETICCTPCT in the last 72 hours. Sepsis Labs: No results for input(s): PROCALCITON, LATICACIDVEN in the last 168 hours.  Recent Results (from the past 240 hour(s))  MRSA PCR Screening     Status: None   Collection Time: 05/27/17  8:08 AM  Result Value Ref Range  Status   MRSA by PCR NEGATIVE NEGATIVE Final    Comment:        The GeneXpert MRSA Assay (FDA approved for NASAL specimens only), is one component of a comprehensive MRSA colonization surveillance program. It is not intended to diagnose MRSA infection nor to guide or monitor treatment for MRSA infections. Performed at [facility], [place] [address] [place], [place] [address]   Gram stain     Status: None   Collection Time: 05/27/17  2:25 PM  Result Value Ref Range Status   Specimen Description FLUID RIGHT PLEURAL  Final   Special Requests   Final    NONE Performed at [facility], [place] [address] [place], [place] [address]    Gram Stain   Final    WBC PRESENT, PREDOMINANTLY PMN NO ORGANISMS SEEN CYTOSPIN SMEAR    Report Status 05/28/2017 FINAL  Final  Culture, body fluid-bottle     Status: None (Preliminary result)   Collection Time: 05/27/17  2:25 PM  Result Value Ref Range Status   Specimen Description FLUID RIGHT PLEURAL  Final   Special Requests BOTTLES DRAWN AEROBIC AND ANAEROBIC  Final   Culture   Final    NO GROWTH < 24 HOURS Performed at [facility], [place] [address], [place], [place] [address]    Report Status PENDING  Incomplete    Radiology Studies: Ct Abdomen Pelvis Wo Contrast  Result Date: 05/28/2017 CLINICAL DATA:  Nausea, vomiting. EXAM: CT ABDOMEN AND PELVIS WITHOUT CONTRAST TECHNIQUE: Multidetector CT imaging of the abdomen and pelvis was performed following the standard protocol without IV contrast. COMPARISON:  CT scan of May 09, 2017. FINDINGS: Lower chest: Moderate bilateral pleural effusions are noted with adjacent subsegmental atelectasis. Hepatobiliary: No focal liver abnormality is seen. No gallstones, gallbladder wall thickening, or biliary dilatation. Pancreas: Unremarkable. No pancreatic ductal dilatation or surrounding inflammatory changes. Spleen: Normal in size without focal abnormality. Adrenals/Urinary Tract: Adrenal  glands are unremarkable. Kidneys are normal, without renal calculi, focal lesion, or hydronephrosis. Bladder is unremarkable. Stomach/Bowel: The stomach appears normal. Patient is status post appendectomy. There is no evidence of bowel obstruction or inflammation. Sigmoid diverticulosis is noted. Vascular/Lymphatic: Aortic atherosclerosis. No enlarged abdominal or pelvic lymph nodes. Reproductive: Prostate is unremarkable. Other: No abdominal wall hernia or abnormality. No abdominopelvic ascites. Musculoskeletal: No acute or significant osseous findings. IMPRESSION: Moderate bilateral pleural effusions with adjacent subsegmental atelectasis. Sigmoid diverticulosis without inflammation.  No acute abnormality seen in the abdomen or pelvis. Aortic Atherosclerosis (ICD10-I70.0). Electronically Signed   By: Marijo Conception, M.D.   On: 05/28/2017 14:06   Dg Chest 1 View  Result Date: 05/27/2017 CLINICAL DATA:  Shortness of breath. Pleural effusions. Status post right thoracentesis. EXAM: CHEST 1 VIEW COMPARISON:  Chest x-ray and CT scan dated 05/26/2017 FINDINGS: There has been marked reduction in the size of the right pleural effusion. There is a small right residual effusion and a small left effusion. Cardiomegaly. CABG. AICD in place. Pulmonary vascularity is normal. Aortic atherosclerosis. IMPRESSION: No pneumothorax after right thoracentesis. Small residual right effusion.  Small left effusion is unchanged. Aortic Atherosclerosis (ICD10-I70.0). Electronically Signed   By: Lorriane Shire M.D.   On: 05/27/2017 14:43   Dg Chest Port 1 View  Result Date: 05/28/2017 CLINICAL DATA:  Shortness of Breath EXAM: PORTABLE CHEST 1 VIEW COMPARISON:  05/27/2017 FINDINGS: Prior CABG. Left AICD is unchanged. Cardiomegaly with vascular congestion. Layering effusions and bilateral lower lobe opacities with worsening aeration in the lung bases since prior study. IMPRESSION: Cardiomegaly with vascular congestion. Increasing  layering effusions and bibasilar atelectasis or infiltrates. Electronically Signed   By: Rolm Baptise M.D.   On: 05/28/2017 08:10   Ir Thoracentesis Asp Pleural Space W/img Guide  Result Date: 05/27/2017 INDICATION: Patient with right pleural effusion. Request is made for diagnostic and therapeutic thoracentesis. EXAM: ULTRASOUND GUIDED DIAGNOSTIC AND THERAPEUTIC THORACENTESIS MEDICATIONS: 10 mL 2% lidocaine COMPLICATIONS: None immediate. PROCEDURE: An ultrasound guided thoracentesis was thoroughly discussed with the patient and questions answered. The benefits, risks, alternatives and complications were also discussed. The patient understands and wishes to proceed with the procedure. Written consent was obtained. Ultrasound was performed to localize and mark an adequate pocket of fluid in the right chest. The area was then prepped and draped in the normal sterile fashion. 2% Lidocaine was used for local anesthesia. Under ultrasound guidance a Safe-T-Centesis catheter was introduced. Thoracentesis was performed. The catheter was removed and a dressing applied. FINDINGS: A total of approximately 2.0 liters of fluid was removed. Samples were sent to the laboratory as requested by the clinical team. Procedure was stopped prior to removal of all fluid as this was patient's first thoracentesis. IMPRESSION: Successful ultrasound guided right thoracentesis yielding 2.0 liters of pleural fluid. Read by: Brynda Greathouse PA-C Electronically Signed   By: Sandi Mariscal M.D.   On: 05/27/2017 16:30   Scheduled Meds: . apixaban  2.5 mg Oral BID  . carvedilol  6.25 mg Oral BID WC  . famotidine  20 mg Oral QHS  . furosemide  20 mg Oral Daily  . hydrALAZINE  12.5 mg Oral TID  . iopamidol      . isosorbide mononitrate  30 mg Oral QHS  . lactose free nutrition  237 mL Oral TID WC  . methimazole  10 mg Oral BID  . metoprolol tartrate  2.5 mg Intravenous Once  . pantoprazole  40 mg Oral QAC breakfast   Continuous  Infusions:   LOS: 2 days   Kerney Elbe, DO [facility] Pager [phone]  If 7PM-7AM, please contact night-coverage [email or web address] Password [facility] 05/28/2017, 7:04 PM

## 2017-05-28 NOTE — Consult Note (Signed)
[facility] Consult  Referring Provider: Kerney Elbe, DO([facility]) Primary Care Physician:  [name], MD Primary Gastroenterologist: [place]  Reason for Consultation:  Significant nausea  HPI: [name] is a 84 y.o. male was admitted for profound weakness, progressively worsening of shortness of breath on exertion and at rest. GI was consulted for severe nausea. Patient states he has not been doing well since last summer. He has severe nausea and dry heaving, no vomiting, no abdominal pain, diarrhea or constipation, but complete lack of appetite and early satiety with 20 or 25 pound weight loss in the last several months. He was on amiodarone which has been discontinued for the last several weeks and was started on methimazole on his prior hospital admission. Patient also reports some difficulty with swallowing, and solids/food feel like they're going down very slowly. EGD on 05/09/2017 for weight loss and heme-positive stool showed a 4 cm hiatal hernia and nonspecific gastric erosions. He was on Pepto-Bismol which causes intermittent black stools. Last colonoscopy 1/13 which showed left-sided diverticulosis and internal hemorrhoids. He is being worked up for generalized weakness and fatigue. Was admitted with a BNP of 4500, underwent 2 L of thoracocentesis. Denies heartburn and acid reflux.   Past Medical History:  Diagnosis Date  . AICD (automatic cardioverter/defibrillator) present   . Arthritis    "minor everywhere" (04/22/2015)  . Asthma    "a touch"  . Barrett esophagus   . CAD (coronary artery disease)    a. s/p 2 vessel CABG with LIMA to LAD, SVG to diagonal 1, SVG to RCA 12/09.  Marland Kitchen Chronic combined systolic and diastolic CHF (congestive heart failure) (HCC)    dry weight 197-200lbs.  . CKD (chronic kidney disease), stage III ([place])   . Diabetes ([place])    "I'm prediabetic" (04/22/2015)  . Dyspnea   . GERD (gastroesophageal reflux  disease)   . H/O: rheumatic fever   . Hepatitis 1957   "don't know what kind:  . History of hiatal hernia   . History of PFTs    a. Amiodarone started 5/16 >> PFTs w/ DLCO 6/16:  FEV1 72% predicted, FEV1/FVC 66%, DLCO 66% >> minimal reversible obstructive airways disease with mild diffusion defect (suggestive of emphysema but absence of hyperinflation inconsistent with dx)  . HTN (hypertension)   . Hypercholesteremia   . NICM (nonischemic cardiomyopathy) ([place])    EF 15-20% by echo 2017  . Orthostatic hypotension   . PAF (paroxysmal atrial fibrillation) ([place])   . Pneumonia 08/2014   "dr thought I may have had a touch"  . S/P AVR (aortic valve replacement) 2009   a. severe AS s/p AVR with pericardial tissue valve 2009.  Marland Kitchen Ventricular tachycardia [facility])     Past Surgical History:  Procedure Laterality Date  . AORTIC VALVE REPLACEMENT  with 23-mm Magna Ease pericardial valve, model number   with 23-mm Magna Ease pericardial valve, model [id number], serial number [id number]   . APPENDECTOMY  1940s  . BI-VENTRICULAR IMPLANTABLE CARDIOVERTER DEFIBRILLATOR  (CRT-D)  04/22/2015  . CARDIAC CATHETERIZATION N/A 11/04/2014   Procedure: Left Heart Cath and Cors/Grafts Angiography;  Surgeon: Leonie Man, MD;  Location: [place] [facility];  Service: Cardiovascular;  Laterality: N/A;  . CARDIAC VALVE REPLACEMENT    . CARDIOVERSION N/A 01/27/2013   Procedure: CARDIOVERSION;  Surgeon: Sueanne Margarita, MD;  Location: [place];  Service: Cardiovascular;  Laterality: N/A;  . CARDIOVERSION N/A 08/25/2014   Procedure: CARDIOVERSION;  Surgeon: Sanda Klein, MD;  Location: [facility];  Service: Cardiovascular;  Laterality: N/A;  . CATARACT EXTRACTION W/ INTRAOCULAR LENS  IMPLANT, BILATERAL Bilateral   . CORONARY ARTERY BYPASS GRAFT  03/10/2008   x 3 Dr. Roxan Hockey  . EP IMPLANTABLE DEVICE N/A 04/22/2015   Procedure: BiV ICD Insertion CRT-D;  Surgeon: [name], MD;  Location: [place] [facility];  Service: Cardiovascular;  Laterality: N/A;  . ESOPHAGOGASTRODUODENOSCOPY (EGD) WITH ESOPHAGEAL DILATION  X1  . ESOPHAGOGASTRODUODENOSCOPY (EGD) WITH PROPOFOL N/A 05/09/2017   Procedure: ESOPHAGOGASTRODUODENOSCOPY (EGD) WITH PROPOFOL;  Surgeon: Carol Ada, MD;  Location: [place];  Service: Endoscopy;  Laterality: N/A;  . IR THORACENTESIS ASP PLEURAL SPACE W/IMG GUIDE  05/27/2017  . TONSILLECTOMY      Prior to Admission medications   Medication Sig Start Date End Date Taking? Authorizing Provider  acetaminophen (TYLENOL) 500 MG tablet Take 1,000 mg by mouth every 6 (six) hours as needed for headache (pain).    Yes [provider]  albuterol (PROVENTIL HFA;VENTOLIN HFA) 108 (90 Base) MCG/ACT inhaler Inhale 1-2 puffs into the lungs every 6 (six) hours as needed for wheezing or shortness of breath.   Yes [provider]  atorvastatin (LIPITOR) 20 MG tablet TAKE 1 TABLET (20 MG TOTAL) DAILY AT 6 PM. 09/27/16  Yes Turner, Traci R, MD  carvedilol (COREG) 12.5 MG tablet Take 12.5 mg by mouth 2 (two) times daily. 05/17/17  Yes [provider]  ELIQUIS 2.5 MG TABS tablet TAKE 1 TABLET TWICE DAILY Patient taking differently: TAKE 1 TABLET (2.5 MG)TWICE DAILY 11/02/16  Yes Turner, Traci R, MD  furosemide (LASIX) 20 MG tablet TAKE 1 TABLET (20 MG TOTAL) BY MOUTH DAILY. 09/27/16  Yes Sueanne Margarita, MD  hydrALAZINE (APRESOLINE) 25 MG tablet Take 0.5 tablets (12.5 mg total) by mouth 2 (two) times daily before a meal. 03/14/17  Yes Turner, Eber Hong, MD  isosorbide mononitrate (IMDUR) 30 MG 24 hr tablet Take 0.5 tablets (15 mg total) by mouth at bedtime. 04/23/17  Yes Imogene Burn, PA-C  methimazole (TAPAZOLE) 10 MG tablet Take 1 tablet (10 mg total) by mouth 2 (two) times daily. 05/15/17  Yes Philemon Kingdom, MD  mexiletine (MEXITIL) 250 MG capsule Take 1 capsule (250 mg total) by mouth 2 (two) times daily. 04/18/17  Yes Camnitz, Will Hassell Done, MD  Multiple Vitamin (MULTIVITAMIN  WITH MINERALS) TABS tablet Take 1 tablet by mouth daily.   Yes [provider]  omega-3 acid ethyl esters (LOVAZA) 1 G capsule Take 2 g by mouth 2 (two) times daily.   Yes [provider]  pantoprazole (PROTONIX) 40 MG tablet Take 1 tablet (40 mg total) by mouth daily before breakfast. 05/10/17  Yes Rai, Ripudeep K, MD  promethazine (PHENERGAN) 25 MG tablet Take 12.5-25 mg by mouth every 8 (eight) hours as needed for nausea or vomiting.  05/24/17  Yes [provider]  ranitidine (ZANTAC) 300 MG tablet Take 300 mg by mouth at bedtime.   Yes [provider]  traMADol (ULTRAM) 50 MG tablet Take 1 tablet (50 mg total) by mouth every 12 (twelve) hours as needed for moderate pain or severe pain. 05/10/17  Yes Rai, Ripudeep K, MD  predniSONE (DELTASONE) 10 MG tablet Take 2 tablets (20 mg total) by mouth daily with breakfast. 05/28/17   Philemon Kingdom, MD    Current Facility-Administered Medications  Medication Dose Route Frequency Provider Last Rate Last Dose  . acetaminophen (TYLENOL) tablet 500 mg  500 mg Oral Q6H PRN Jamse Arn,  Kyra Searles, MD   500 mg at 05/27/17 1646  . apixaban (ELIQUIS) tablet 2.5 mg  2.5 mg Oral BID Minus Breeding, MD   2.5 mg at 05/28/17 0947  . carvedilol (COREG) tablet 6.25 mg  6.25 mg Oral BID WC Bonnell Public Tublu, MD   6.25 mg at 05/28/17 0851  . famotidine (PEPCID) tablet 20 mg  20 mg Oral QHS Bonnell Public Tublu, MD   20 mg at 05/27/17 2229  . furosemide (LASIX) tablet 20 mg  20 mg Oral Daily Bonnell Public Tublu, MD   20 mg at 05/28/17 0850  . hydrALAZINE (APRESOLINE) tablet 12.5 mg  12.5 mg Oral TID Minus Breeding, MD      . iopamidol (ISOVUE-300) 61 % injection           . ipratropium-albuterol (DUONEB) 0.5-2.5 (3) MG/3ML nebulizer solution 3 mL  3 mL Nebulization Q6H PRN Sheikh, Omair Latif, DO      . isosorbide mononitrate (IMDUR) 24 hr tablet 30 mg  30 mg Oral QHS Minus Breeding, MD      . lactose free  nutrition (BOOST PLUS) liquid 237 mL  237 mL Oral TID WC Sheikh, Omair Latif, DO   237 mL at 05/28/17 0850  . methimazole (TAPAZOLE) tablet 10 mg  10 mg Oral BID Bonnell Public Tublu, MD   10 mg at 05/28/17 0850  . metoprolol tartrate (LOPRESSOR) injection 2.5 mg  2.5 mg Intravenous Once Kilroy, Luke K, PA-C      . ondansetron [facility]) tablet 4 mg  4 mg Oral Q6H PRN Vashti Hey, MD       Or  . ondansetron [facility]) injection 4 mg  4 mg Intravenous Q6H PRN Bonnell Public Tublu, MD      . oxyCODONE (Oxy IR/ROXICODONE) immediate release tablet 5 mg  5 mg Oral Q6H PRN Vertis Kelch, NP   5 mg at 05/27/17 2029  . pantoprazole (PROTONIX) EC tablet 40 mg  40 mg Oral QAC breakfast Bonnell Public Tublu, MD   40 mg at 05/28/17 0948  . traMADol (ULTRAM) tablet 50 mg  50 mg Oral Q12H PRN Raiford Noble [facility], DO   50 mg at 05/28/17 0500    Allergies as of 05/26/2017 - Review Complete 05/26/2017  Allergen Reaction Noted  . Novocain [procaine] Swelling 08/22/2014    Family History  Problem Relation Age of Onset  . Alzheimer's disease Mother   . COPD Daughter   . Alzheimer's disease Unknown   . COPD Sister   . Depression Sister   . Heart disease Sister   . Hyperlipidemia Sister   . Hypertension Sister   . Esophageal cancer Sister        + smoker    Social History   Socioeconomic History  . Marital status: Married    Spouse name: Not on file  . Number of children: Not on file  . Years of education: Not on file  . Highest education level: Not on file  Social Needs  . Financial resource strain: Not on file  . Food insecurity - worry: Not on file  . Food insecurity - inability: Not on file  . Transportation needs - medical: Not on file  . Transportation needs - non-medical: Not on file  Occupational History  . Not on file  Tobacco Use  . Smoking status: Never Smoker  . Smokeless tobacco: Never Used  Substance and Sexual Activity  . Alcohol use: Yes     Comment: 04/22/2015 "nothing since  the 1980s; never had a problem w/it"  . Drug use: No  . Sexual activity: No  Other Topics Concern  . Not on file  Social History Narrative   Married, lives with spouse [name]   4 children, 2 boys and 2 girls > one daughter passed   OCCUPATION: retired, Pension scheme manager for [employer]: Positive for: GI: Described in detail in HPI.    Gen: anorexia, fatigue, weakness, malaise, involuntary weight loss, Denies any fever, chills, rigors, night sweats, and sleep disorder CV: orthopnea, PND,Denies chest pain, angina, palpitations, syncope,  peripheral edema, and claudication. Resp: dyspnea,Denies  cough, sputum, wheezing, coughing up blood. GU : Denies urinary burning, blood in urine, urinary frequency, urinary hesitancy, nocturnal urination, and urinary incontinence. MS: Denies joint pain or swelling.  Denies muscle weakness, cramps, atrophy.  Derm: Denies rash, itching, oral ulcerations, hives, unhealing ulcers.  Psych: Denies depression, anxiety, memory loss, suicidal ideation, hallucinations,  and confusion. Heme: Denies bruising, bleeding, and enlarged lymph nodes. Neuro:  Denies any headaches, dizziness, paresthesias. Endo:  DM,Denies any problems with  thyroid, adrenal function.  Physical Exam: Vital signs in last 24 hours: Temp:  [97.7 F (36.5 C)-98 F (36.7 C)] 97.8 F (36.6 C) (02/19 0456) Pulse Rate:  [75-89] 89 (02/19 0848) Resp:  [18-28] 19 (02/19 0848) BP: (120-154)/(64-78) 154/72 (02/19 0848) SpO2:  [94 %-98 %] 96 % (02/19 0848) Weight:  [83.2 kg (183 lb 8 oz)] 83.2 kg (183 lb 8 oz) (02/19 0456) Last BM Date: 05/27/17  General:   Alert,  Well-developed, well-nourished, pleasant and cooperative in NAD Head:  Normocephalic and atraumatic. Eyes:  Sclera clear, no icterus.   Conjunctiva pink. Ears:  Normal auditory acuity. Nose:  No deformity, discharge,  or lesions. Mouth:  No deformity or lesions.   Oropharynx pink & moist. Neck:  Supple; no masses or thyromegaly. Lungs:  Clear throughout to auscultation.   No wheezes, crackles, or rhonchi. No acute distress. Heart:  Regular rate and rhythm; no murmurs, clicks, rubs,  or gallops. Extremities:  Without clubbing or edema. Neurologic:  Alert and  oriented x4;  grossly normal neurologically. Skin:  Intact without significant lesions or rashes. Psych:  Alert and cooperative. Normal mood and affect. Abdomen:  Soft, nontender and nondistended. No masses, hepatosplenomegaly or hernias noted. Normal bowel sounds, without guarding, and without rebound.         Lab Results: Recent Labs    05/26/17 1226 05/27/17 0617 05/28/17 0445  WBC 9.8 9.1 11.8*  HGB 12.8* 13.4 12.7*  HCT 40.0 41.7 40.4  PLT 376 322 320   BMET Recent Labs    05/26/17 1226 05/27/17 0617 05/28/17 0445  NA 143 145 140  K 3.6 3.6 3.9  CL 106 108 105  CO2 _0 GLUCOSE 146* 95 110*  BUN 31* 31* 31*  CREATININE 1.47* 1.50* 1.37*  CALCIUM 8.5* 8.4* 8.2*   LFT Recent Labs    05/28/17 0445  PROT 5.4*  ALBUMIN 2.5*  AST 21  ALT 21  ALKPHOS 54  BILITOT 0.9   PT/INR Recent Labs    05/26/17 1226  LABPROT 19.3*  INR 1.65    Studies/Results: Ct Abdomen Pelvis Wo Contrast  Result Date: 05/28/2017 CLINICAL DATA:  Nausea, vomiting. EXAM: CT ABDOMEN AND PELVIS WITHOUT CONTRAST TECHNIQUE: Multidetector CT imaging of the abdomen and pelvis was performed following the standard protocol without IV contrast. COMPARISON:  CT scan of May 09, 2017. FINDINGS: Lower chest:  Moderate bilateral pleural effusions are noted with adjacent subsegmental atelectasis. Hepatobiliary: No focal liver abnormality is seen. No gallstones, gallbladder wall thickening, or biliary dilatation. Pancreas: Unremarkable. No pancreatic ductal dilatation or surrounding inflammatory changes. Spleen: Normal in size without focal abnormality. Adrenals/Urinary Tract: Adrenal glands are  unremarkable. Kidneys are normal, without renal calculi, focal lesion, or hydronephrosis. Bladder is unremarkable. Stomach/Bowel: The stomach appears normal. Patient is status post appendectomy. There is no evidence of bowel obstruction or inflammation. Sigmoid diverticulosis is noted. Vascular/Lymphatic: Aortic atherosclerosis. No enlarged abdominal or pelvic lymph nodes. Reproductive: Prostate is unremarkable. Other: No abdominal wall hernia or abnormality. No abdominopelvic ascites. Musculoskeletal: No acute or significant osseous findings. IMPRESSION: Moderate bilateral pleural effusions with adjacent subsegmental atelectasis. Sigmoid diverticulosis without inflammation. No acute abnormality seen in the abdomen or pelvis. Aortic Atherosclerosis (ICD10-I70.0). Electronically Signed   By: Marijo Conception, M.D.   On: 05/28/2017 14:06   Dg Chest 1 View  Result Date: 05/27/2017 CLINICAL DATA:  Shortness of breath. Pleural effusions. Status post right thoracentesis. EXAM: CHEST 1 VIEW COMPARISON:  Chest x-ray and CT scan dated 05/26/2017 FINDINGS: There has been marked reduction in the size of the right pleural effusion. There is a small right residual effusion and a small left effusion. Cardiomegaly. CABG. AICD in place. Pulmonary vascularity is normal. Aortic atherosclerosis. IMPRESSION: No pneumothorax after right thoracentesis. Small residual right effusion.  Small left effusion is unchanged. Aortic Atherosclerosis (ICD10-I70.0). Electronically Signed   By: Lorriane Shire M.D.   On: 05/27/2017 14:43   Ct Chest Wo Contrast  Addendum Date: 05/26/2017   ADDENDUM REPORT: 05/26/2017 19:42 ADDENDUM: High density sludge or stones in the gallbladder Electronically Signed   By: Donavan Foil M.D.   On: 05/26/2017 19:42   Result Date: 05/26/2017 CLINICAL DATA:  Persistent EXAM: CT CHEST WITHOUT CONTRAST TECHNIQUE: Multidetector CT imaging of the chest was performed following the standard protocol without IV  contrast. COMPARISON:  Radiograph 05/26/2017, CT chest 05/08/2017, 03/05/2017, 05/08/2016 FINDINGS: Cardiovascular: Limited evaluation without intravenous contrast. Moderate-to-marked aortic atherosclerosis. No aneurysmal dilatation. Post CABG changes. Coronary artery calcification. Partially visualized cardiac pacing leads. Dense mitral calcification. Aortic valve replacement. Mild cardiomegaly. No significant pericardial effusion Mediastinum/Nodes: Midline trachea. No thyroid mass. Stable multiple slightly enlarged lymph nodes, for example AP window lymph node measures 15 mm. Low right paratracheal lymph node measures 1 cm. Esophagus within normal limits. Lungs/Pleura: Moderate to large right-sided pleural effusion with mild loculation along the pulmonary fissure. Small left-sided pleural effusion, both are increased since the prior CT. Mild emphysema. Progressive consolidation at the right lower lobe. Passive atelectasis in the left lower lobe. Scarring in the left apex. Small upper lobe lung nodules similar compared to most recent prior CT. Upper Abdomen: Diffuse increased density in the gallbladder. Musculoskeletal: Post sternotomy changes. Degenerative changes of the spine. No acute or suspicious lesion. IMPRESSION: 1. Moderate large right pleural effusion and small left pleural effusion, both increased compared to prior CT. Progression in consolidation at the right lower lobe which may reflect worsening atelectasis or pneumonia 2. Stable mediastinal adenopathy, possibly reactive 3. Mild emphysema. 4. Small upper lobe lung nodules, see prior CT report 03/05/2017 and 05/08/2016 for follow-up recommendation. Aortic Atherosclerosis (ICD10-I70.0) and Emphysema (ICD10-J43.9). Electronically Signed: By: Donavan Foil M.D. On: 05/26/2017 18:54   Dg Chest Port 1 View  Result Date: 05/28/2017 CLINICAL DATA:  Shortness of Breath EXAM: PORTABLE CHEST 1 VIEW COMPARISON:  05/27/2017 FINDINGS: Prior CABG. Left AICD is  unchanged. Cardiomegaly with vascular congestion.  Layering effusions and bilateral lower lobe opacities with worsening aeration in the lung bases since prior study. IMPRESSION: Cardiomegaly with vascular congestion. Increasing layering effusions and bibasilar atelectasis or infiltrates. Electronically Signed   By: Rolm Baptise M.D.   On: 05/28/2017 08:10   Ir Thoracentesis Asp Pleural Space W/img Guide  Result Date: 05/27/2017 INDICATION: Patient with right pleural effusion. Request is made for diagnostic and therapeutic thoracentesis. EXAM: ULTRASOUND GUIDED DIAGNOSTIC AND THERAPEUTIC THORACENTESIS MEDICATIONS: 10 mL 2% lidocaine COMPLICATIONS: None immediate. PROCEDURE: An ultrasound guided thoracentesis was thoroughly discussed with the patient and questions answered. The benefits, risks, alternatives and complications were also discussed. The patient understands and wishes to proceed with the procedure. Written consent was obtained. Ultrasound was performed to localize and mark an adequate pocket of fluid in the right chest. The area was then prepped and draped in the normal sterile fashion. 2% Lidocaine was used for local anesthesia. Under ultrasound guidance a Safe-T-Centesis catheter was introduced. Thoracentesis was performed. The catheter was removed and a dressing applied. FINDINGS: A total of approximately 2.0 liters of fluid was removed. Samples were sent to the laboratory as requested by the clinical team. Procedure was stopped prior to removal of all fluid as this was patient's first thoracentesis. IMPRESSION: Successful ultrasound guided right thoracentesis yielding 2.0 liters of pleural fluid. Read by: Brynda Greathouse PA-C Electronically Signed   By: Sandi Mariscal M.D.   On: 05/27/2017 16:30    Impression: 1. Significant nausea  Nausea likely multifactorial- ? Underlying gastroparesis, multiple medications, overall deconditioning from multiple co-morbidities(hyperthyroidism, CHF, pleural  effusions).  CAT scan of abdomen and pelvis without contrast showed unremarkable stomach, small and large bowel except sigmoid diverticulosis. Recent endoscopy on 05/09/2017, showed a 4 cm hiatal hernia and nonspecific gastric erosions.  Currently on Pepcid 20 mg at bedtime, Protonix 40 mg at breakfast. Zofran 4 mg every 6 hours when necessary for nausea.  2. Involuntary weight loss, generalized weakness 3. Bilateral pleural effusion, status post right thoracocentesis 4. Congestive heart failure, CAD status post CABG, atrial fibrillation,  AICD, aortic valve replacement(bioprosthesis)    Plan: 1. Gastric empty scan  2. May need Reglan/erythromycin- if cleared by cardiology and if patient does not have QT prolongation, but associated with significant other side effects(including not limited to tardive dyskinesia), however nausea is not controlled with Zofran alone.  Will try promethazine 12.5 mg every 6 hours as needed meanwhile.     LOS: 2 days   Gari Crown  05/28/2017, 4:13 PM  Pager [phone] If no answer or after 5 PM call [phone]

## 2017-05-29 DIAGNOSIS — R0602 Shortness of breath: Secondary | ICD-10-CM

## 2017-05-29 DIAGNOSIS — I472 Ventricular tachycardia: Secondary | ICD-10-CM

## 2017-05-29 DIAGNOSIS — E43 Unspecified severe protein-calorie malnutrition: Secondary | ICD-10-CM

## 2017-05-29 LAB — CBC WITH DIFFERENTIAL/PLATELET
BASOS ABS: 0 10*3/uL (ref 0.0–0.1)
BASOS PCT: 0 %
Eosinophils Absolute: 0.5 10*3/uL (ref 0.0–0.7)
Eosinophils Relative: 5 %
HEMATOCRIT: 40 % (ref 39.0–52.0)
HEMOGLOBIN: 12.5 g/dL — AB (ref 13.0–17.0)
Lymphocytes Relative: 21 %
Lymphs Abs: 2.5 10*3/uL (ref 0.7–4.0)
MCH: 27.6 pg (ref 26.0–34.0)
MCHC: 31.3 g/dL (ref 30.0–36.0)
MCV: 88.3 fL (ref 78.0–100.0)
Monocytes Absolute: 1.8 10*3/uL — ABNORMAL HIGH (ref 0.1–1.0)
Monocytes Relative: 16 %
NEUTROS ABS: 6.6 10*3/uL (ref 1.7–7.7)
NEUTROS PCT: 58 %
Platelets: 361 10*3/uL (ref 150–400)
RBC: 4.53 MIL/uL (ref 4.22–5.81)
RDW: 14.8 % (ref 11.5–15.5)
WBC: 11.4 10*3/uL — AB (ref 4.0–10.5)

## 2017-05-29 LAB — GLUCOSE, CAPILLARY: GLUCOSE-CAPILLARY: 95 mg/dL (ref 65–99)

## 2017-05-29 LAB — COMPREHENSIVE METABOLIC PANEL
ALBUMIN: 2.5 g/dL — AB (ref 3.5–5.0)
ALK PHOS: 60 U/L (ref 38–126)
ALT: 22 U/L (ref 17–63)
ANION GAP: 13 (ref 5–15)
AST: 29 U/L (ref 15–41)
BILIRUBIN TOTAL: 0.9 mg/dL (ref 0.3–1.2)
BUN: 28 mg/dL — ABNORMAL HIGH (ref 6–20)
CALCIUM: 8.4 mg/dL — AB (ref 8.9–10.3)
CO2: 23 mmol/L (ref 22–32)
Chloride: 101 mmol/L (ref 101–111)
Creatinine, Ser: 1.34 mg/dL — ABNORMAL HIGH (ref 0.61–1.24)
GFR calc Af Amer: 55 mL/min — ABNORMAL LOW (ref >60)
GFR calc non Af Amer: 48 mL/min — ABNORMAL LOW (ref >60)
GLUCOSE: 119 mg/dL — AB (ref 65–99)
POTASSIUM: 4.1 mmol/L (ref 3.5–5.1)
SODIUM: 137 mmol/L (ref 135–145)
TOTAL PROTEIN: 5.7 g/dL — AB (ref 6.5–8.1)

## 2017-05-29 LAB — RESPIRATORY PANEL BY PCR
ADENOVIRUS-RVPPCR: NOT DETECTED
BORDETELLA PERTUSSIS-RVPCR: NOT DETECTED
CHLAMYDOPHILA PNEUMONIAE-RVPPCR: NOT DETECTED
CORONAVIRUS NL63-RVPPCR: NOT DETECTED
Coronavirus 229E: NOT DETECTED
Coronavirus HKU1: NOT DETECTED
Coronavirus OC43: NOT DETECTED
INFLUENZA A-RVPPCR: NOT DETECTED
INFLUENZA B-RVPPCR: NOT DETECTED
MYCOPLASMA PNEUMONIAE-RVPPCR: NOT DETECTED
Metapneumovirus: NOT DETECTED
PARAINFLUENZA VIRUS 4-RVPPCR: NOT DETECTED
Parainfluenza Virus 1: NOT DETECTED
Parainfluenza Virus 2: NOT DETECTED
Parainfluenza Virus 3: NOT DETECTED
RESPIRATORY SYNCYTIAL VIRUS-RVPPCR: NOT DETECTED
RHINOVIRUS / ENTEROVIRUS - RVPPCR: NOT DETECTED

## 2017-05-29 LAB — ECHOCARDIOGRAM COMPLETE
Height: 72 in
WEIGHTICAEL: 2956.8 [oz_av]

## 2017-05-29 LAB — PHOSPHORUS: Phosphorus: 2.8 mg/dL (ref 2.5–4.6)

## 2017-05-29 LAB — MAGNESIUM: Magnesium: 2 mg/dL (ref 1.7–2.4)

## 2017-05-29 MED ORDER — BENZONATATE 100 MG PO CAPS
200.0000 mg | ORAL_CAPSULE | Freq: Two times a day (BID) | ORAL | Status: DC | PRN
  Administered 2017-05-29 – 2017-06-03 (×7): 200 mg via ORAL
  Filled 2017-05-29 (×7): qty 2

## 2017-05-29 MED ORDER — MEXILETINE HCL 250 MG PO CAPS
250.0000 mg | ORAL_CAPSULE | Freq: Two times a day (BID) | ORAL | Status: DC
  Administered 2017-05-29 – 2017-06-04 (×12): 250 mg via ORAL
  Filled 2017-05-29 (×13): qty 1

## 2017-05-29 MED ORDER — MEXILETINE HCL 200 MG PO CAPS: 200.0000 mg | ORAL_CAPSULE | Freq: Two times a day (BID) | ORAL | Status: DC

## 2017-05-29 NOTE — Progress Notes (Signed)
Pt had sustaining VTach for about 19mns. Pt alert and oriented, denies chest pain, denies nausea and vomiting. Pt stated " I just feel short of breath" . Rapid response paged. Pt instructed to do vaso vagal and pt went back to Vpaced with controlled rate in the 80's-90s. Dr. KClaiborne Billingsand KRaliegh IpSchorr updated. Dr. KClaiborne Billingscalled back and will follow per Dr. KClaiborne Billings

## 2017-05-29 NOTE — Progress Notes (Addendum)
Progress Note  Patient Name: [name] Date of Encounter: 05/29/2017  Primary Cardiologist: [name], MD   Subjective   Pt is asymptomatic and denies chest pain and palpitations. He is NPO waiting for gastric emptying study.  Inpatient Medications    Scheduled Meds: . apixaban  2.5 mg Oral BID  . carvedilol  6.25 mg Oral BID WC  . famotidine  20 mg Oral QHS  . furosemide  20 mg Oral Daily  . hydrALAZINE  12.5 mg Oral TID  . isosorbide mononitrate  30 mg Oral QHS  . lactose free nutrition  237 mL Oral TID WC  . methimazole  10 mg Oral BID  . metoprolol tartrate  2.5 mg Intravenous Once  . pantoprazole  40 mg Oral QAC breakfast   Continuous Infusions:  PRN Meds: acetaminophen, ipratropium-albuterol, ondansetron **OR** ondansetron (ZOFRAN) IV, oxyCODONE, promethazine, traMADol   Vital Signs    Vitals:   05/29/17 0549 05/29/17 0645 05/29/17 0651 05/29/17 0900  BP: 138/68 (!) 142/96 (!) 160/89 140/77  Pulse: 78 (!) 160 91 82  Resp: _0 Temp: 98.1 F (36.7 C)     TempSrc: Oral     SpO2: 95% 98% 95% 98%  Weight: 188 lb 4.8 oz (85.4 kg)     Height:        Intake/Output Summary (Last 24 hours) at 05/29/2017 1050 Last data filed at 05/29/2017 1014 Gross per 24 hour  Intake 0 ml  Output 700 ml  Net -700 ml   Filed Weights   05/27/17 0614 05/28/17 0456 05/29/17 0549  Weight: 184 lb 12.8 oz (83.8 kg) 183 lb 8 oz (83.2 kg) 188 lb 4.8 oz (85.4 kg)    Telemetry    Runs of Vtach, v-pacing - Personally Reviewed  ECG    V paced rhythm - Personally Reviewed  Physical Exam   GEN: No acute distress.   Neck: minimal JVD Cardiac: RRR, no murmurs, rubs, or gallops.  Respiratory: Clear to auscultation bilaterally, diminished in bases GI: Soft, nontender, non-distended  MS: Trace to 1+ LE edema; No deformity. Neuro:  Nonfocal  Psych: Normal affect   Labs    Chemistry Recent Labs  Lab 05/27/17 0617 05/28/17 0445 05/29/17 0652  NA 145 140 137   K 3.6 3.9 4.1  CL 108 105 101  CO2 _1 GLUCOSE 95 110* 119*  BUN 31* 31* 28*  CREATININE 1.50* 1.37* 1.34*  CALCIUM 8.4* 8.2* 8.4*  PROT 5.8* 5.4* 5.7*  ALBUMIN 2.8* 2.5* 2.5*  AST 32 21 29  ALT _2 ALKPHOS 56 54 60  BILITOT 0.9 0.9 0.9  GFRNONAA 42* 46* 48*  GFRAA 48* 54* 55*  ANIONGAP _3 Hematology Recent Labs  Lab 05/27/17 0617 05/28/17 0445 05/29/17 0652  WBC 9.1 11.8* 11.4*  RBC 4.70 4.60 4.53  HGB 13.4 12.7* 12.5*  HCT 41.7 40.4 40.0  MCV 88.7 87.8 88.3  MCH 28.5 27.6 27.6  MCHC 32.1 31.4 31.3  RDW 14.7 14.5 14.8  PLT 322 320 361    Cardiac Enzymes Recent Labs  Lab 05/26/17 1226 05/26/17 1812 05/26/17 2258 05/27/17 0617  TROPONINI 0.11* 0.13* 0.10* 0.10*   No results for input(s): TROPIPOC in the last 168 hours.   BNP Recent Labs  Lab 05/26/17 1226  BNP >4,500.0*     DDimer No results for input(s): DDIMER in the last 168 hours.   Radiology  Ct Abdomen Pelvis Wo Contrast  Result Date: 05/28/2017 CLINICAL DATA:  Nausea, vomiting. EXAM: CT ABDOMEN AND PELVIS WITHOUT CONTRAST TECHNIQUE: Multidetector CT imaging of the abdomen and pelvis was performed following the standard protocol without IV contrast. COMPARISON:  CT scan of May 09, 2017. FINDINGS: Lower chest: Moderate bilateral pleural effusions are noted with adjacent subsegmental atelectasis. Hepatobiliary: No focal liver abnormality is seen. No gallstones, gallbladder wall thickening, or biliary dilatation. Pancreas: Unremarkable. No pancreatic ductal dilatation or surrounding inflammatory changes. Spleen: Normal in size without focal abnormality. Adrenals/Urinary Tract: Adrenal glands are unremarkable. Kidneys are normal, without renal calculi, focal lesion, or hydronephrosis. Bladder is unremarkable. Stomach/Bowel: The stomach appears normal. Patient is status post appendectomy. There is no evidence of bowel obstruction or inflammation. Sigmoid diverticulosis is noted.  Vascular/Lymphatic: Aortic atherosclerosis. No enlarged abdominal or pelvic lymph nodes. Reproductive: Prostate is unremarkable. Other: No abdominal wall hernia or abnormality. No abdominopelvic ascites. Musculoskeletal: No acute or significant osseous findings. IMPRESSION: Moderate bilateral pleural effusions with adjacent subsegmental atelectasis. Sigmoid diverticulosis without inflammation. No acute abnormality seen in the abdomen or pelvis. Aortic Atherosclerosis (ICD10-I70.0). Electronically Signed   By: Marijo Conception, M.D.   On: 05/28/2017 14:06   Dg Chest 1 View  Result Date: 05/27/2017 CLINICAL DATA:  Shortness of breath. Pleural effusions. Status post right thoracentesis. EXAM: CHEST 1 VIEW COMPARISON:  Chest x-ray and CT scan dated 05/26/2017 FINDINGS: There has been marked reduction in the size of the right pleural effusion. There is a small right residual effusion and a small left effusion. Cardiomegaly. CABG. AICD in place. Pulmonary vascularity is normal. Aortic atherosclerosis. IMPRESSION: No pneumothorax after right thoracentesis. Small residual right effusion.  Small left effusion is unchanged. Aortic Atherosclerosis (ICD10-I70.0). Electronically Signed   By: Lorriane Shire M.D.   On: 05/27/2017 14:43   Dg Chest Port 1 View  Result Date: 05/28/2017 CLINICAL DATA:  Shortness of Breath EXAM: PORTABLE CHEST 1 VIEW COMPARISON:  05/27/2017 FINDINGS: Prior CABG. Left AICD is unchanged. Cardiomegaly with vascular congestion. Layering effusions and bilateral lower lobe opacities with worsening aeration in the lung bases since prior study. IMPRESSION: Cardiomegaly with vascular congestion. Increasing layering effusions and bibasilar atelectasis or infiltrates. Electronically Signed   By: Rolm Baptise M.D.   On: 05/28/2017 08:10   Ir Thoracentesis Asp Pleural Space W/img Guide  Result Date: 05/27/2017 INDICATION: Patient with right pleural effusion. Request is made for diagnostic and therapeutic  thoracentesis. EXAM: ULTRASOUND GUIDED DIAGNOSTIC AND THERAPEUTIC THORACENTESIS MEDICATIONS: 10 mL 2% lidocaine COMPLICATIONS: None immediate. PROCEDURE: An ultrasound guided thoracentesis was thoroughly discussed with the patient and questions answered. The benefits, risks, alternatives and complications were also discussed. The patient understands and wishes to proceed with the procedure. Written consent was obtained. Ultrasound was performed to localize and mark an adequate pocket of fluid in the right chest. The area was then prepped and draped in the normal sterile fashion. 2% Lidocaine was used for local anesthesia. Under ultrasound guidance a Safe-T-Centesis catheter was introduced. Thoracentesis was performed. The catheter was removed and a dressing applied. FINDINGS: A total of approximately 2.0 liters of fluid was removed. Samples were sent to the laboratory as requested by the clinical team. Procedure was stopped prior to removal of all fluid as this was patient's first thoracentesis. IMPRESSION: Successful ultrasound guided right thoracentesis yielding 2.0 liters of pleural fluid. Read by: Brynda Greathouse PA-C Electronically Signed   By: Sandi Mariscal M.D.   On: 05/27/2017 16:30    Cardiac Studies  Echo 05/27/17: Study Conclusions - Left ventricle: The cavity size was normal. Wall thickness was   increased in a pattern of mild LVH. Systolic function was   severely reduced. The estimated ejection fraction was in the   range of 20% to 25%. There is akinesis of the inferolateral,   inferior, and inferoseptal myocardium. Features are consistent   with a pseudonormal left ventricular filling pattern, with   concomitant abnormal relaxation and increased filling pressure   (grade 2 diastolic dysfunction). - Aortic valve: A pericardial tissue valve bioprosthesis was   present and functioning normally. Peak velocity (S): 226 cm/s.   Mean gradient (S): 10 mm Hg. Valve area (VTI): 1.52 cm^2. Valve    area (Vmax): 1.5 cm^2. Valve area (Vmean): 1.59 cm^2. - Mitral valve: Calcified annulus. Mildly thickened leaflets .   There was mild regurgitation. - Left atrium: The atrium was severely dilated. - Right ventricle: Pacer wire or catheter noted in right ventricle. - Pulmonary arteries: Systolic pressure was mildly increased. PA   peak pressure: 35 mm Hg (S).  Impressions: - Compared to the prior study, there has been no significant interval change.  Patient Profile     84 y.o. male with multiple medica problems.  History of AVR/CABG,  CM with CRTD.  Recent hyperthyroidism on amiodarone.  We are called to see for acute on chronic CHF.   Assessment & Plan    1. 37-beat run of VT in the 170s overnight, another bout of sustained VT this morning in the 150s (7 min) Nursing instructed patient to perform vagal maneuvers this morning and VT broke (timing is unclear). He was largely asymptomatic. HT improved to the 80-90s. Mg and K are WNL. Pt is now v pacing but with frequent PVCs. Will re-engage EP    2. AVR Prosthetic valve functioning normally on echo.   3. Acute on chronic systolic heart failure He is diuresing on 20 mg PO lasix daily. He is overall net negative 770 ccs with 500 urine output yesterday. sCr 1.34 (1.37), K is stable. Will continue gentle diuresis. Pressures have been labile - continue to monitor.    4. PAF CRT - D Interrogation is not found in chart. Telemetry with frequent PVCs and runs of Vtach.     For questions or updates, please contact [facility] Please consult [email or web address] for contact info under Cardiology/STEMI.      Signed, Tami Lin Duke, PA  05/29/2017, 10:50 AM    History and all data above reviewed.  Patient examined.  I agree with the findings as above.  He has cough unchanged and SOB that has been slightly worse over the last couple of days.  VTach as noted. The patient exam reveals COR:RRR  ,  Lungs: Few basilar crackles  ,  Abd: Positive  bowel sounds, no rebound no guarding, Ext No edema  .  All available labs, radiology testing, previous records reviewed. Agree with documented assessment and plan. Acute systolic HF.  Today I will continue with the low dose of diuretic since he was net negative yesterday.  However, if he has increased WOB or other complaints tonight he could get a dose of IV diuresis.    Tyrae Annahi Short  2:02 PM  05/29/2017

## 2017-05-29 NOTE — Progress Notes (Signed)
PROGRESS NOTE    [name]  VFI:[id number] DOB: 23-Oct-1932 DOA: 05/26/2017 PCP: [name], MD   Chief Complaint  Patient presents with  . Shortness of Breath  . Weakness    Brief Narrative:  HPI On 05/26/2017 by Dr. Bonnell Public [name] is an 84 y.o. male with past medical history significant for cardiomyopathy with an EF of 25% with ICD in place, atrial fibrillation, COPD, status post AVR, postural hypotension, DM who was discharged 15 days ago when he presented for for increasing weakness, weight loss and shortness of breath. Workup revealed hyperthyroidism which was thought to be secondary to amiodarone. Patient was started on methimazole and discharged home for follow-up with endocrinology.  Patient now presents stating that he did well for about 2 days after discharge and "it started getting bad again". Patient's main complaints are profound difficulty with ambulation. He describes a feeling of "feeling sick in my stomach" every time he tries to walk. He is also extremely short of breath with ambulation. Patient notes these symptoms have been present pretty much without change for the past 2 weeks however he came in today because his children forced him to come because he was unable to get off the couch because he was so weak. Patient does admit to some orthopnea which is also been going on for the past couple of weeks. Patient notes he is unable to walk across even a couple of steps due to discomfort in his abdominal area and shortness of breath.  Patient also complains of difficulty eating although it's unclear to me whether this is dysphagia, anorexia or gastric parous cyst. He complains nausea and a discomfort and abdominal area almost all the time but again it's worse with any exertion. Patient denies any fevers or chills but does admit to sweats during the day which have been present for several months. These are been attributed to hyperthyroidism. He also notes  a 20 pound weight loss over the past couple of weeks which he also mentioned on last admission last month. Patient also notes he's had several episodes of paroxysmal coughing such that he felt that he was cannot vomit. These have been occurring over the past several weeks. These are entirely nonproductive without hemoptysis or any phlegm production.  Interim history  Patient admitted for right pleural effusion, had thoracentesis on 05/27/2017.  Cardiology consulted and appreciated.  Patient also has sustained V. tach this morning approximately 7 minutes.  Patient also with significant nausea, CT abdomen and pelvis obtained.  GI consulted. Assessment & Plan   Ventricular tachycardia -Patient with approximately 7 minutes worth of V. tach this morning. -Rapid response was called, patient was instructed to perform vagal maneuvers, his tachycardia eventually broke. -patient has remained asymptomatic -potassium 4.1, magnesium 2 -cardiology possibly considering catheterization prior starting inotropic therapy (in [place] on 2/19)  Acute on chronic systolic/diastolic heart failure -Echocardiogram showed an EF of 41-66%, grade 2 diastolic dysfunction -BNP on admission greater than 4500 -Cardiology consulted and appreciated -Patient currently on oral Lasix 20 mg daily -Monitor intake and output, daily weights -Continue coreg -Not on ACE inhibitor or ARB due to renal insufficiency  Elevated troponin -Troponin 0.13, 0.10, 0.10 -Cardiology consulted and appreciated -Echocardiogram  Right pleural effusion -Noted on chest x-ray 05/26/2017 -Interventional radiology consulted and appreciated, Eliquis was held -Status post thoracentesis, 2 L drained -Fluid culture shows no growth, Gram stain showed no organisms  Atrial fibrillation, paroxysmal -Continue Coreg -Currently rate controlled -Continue Eliquis  Abdominal discomfort -  No longer complaining of abdominal pain, etiology unclear -CT abdomen pelvis  showed moderate bilateral pleural effusions with adjacent subsegmental atelectasis.  Sigmoid diverticulosis without inflammation.  No acute abnormality seen in the abdomen or pelvis. -mexiletine can be associated with GI discomfort (held) -?gastroparesis -GI consulted and appreciated, recommended gastric emptying study and possible trial of promethazine.  Patient may need erythromycin/Reglan however pending further recommendations from cardiology given these medications can prolong QT  Hyperthyroidism -TSH less than 0.010, T3 4.2, free T4 4.01 -Continue methimazole -Patient follows with endocrinology, Dr. Cruzita Lederer   Coughing -Suspect secondary to pleural effusion -Respiratory viral panel negative  Dyspnea -Likely multifactorial including pleural effusion versus CHF versus atrial fibrillation -Patient also with history of COPD -Continue neb treatments as well as Lasix  Essential hypertension -Continue Coreg, hydralazine, Lasix  Chronic kidney disease, stage III -Continue to monitor BMP closely given the patient diuresing  CAD -Status post CABG -Currently chest pain-free -continue coreg, imdur -Statin discontinued  GERD/history of Barrett's esophagus -Continue Protonix/pepcid  Generalized weakness and fatigue/failure to thrive -Patient has had weight loss and shortness of breath for quite some time -Possibly secondary to the above items -Will continue to monitor and treat accordingly -PT recommended SNF, social work consulted  DVT Prophylaxis  Eliquis  Code Status: Full  Family Communication: none at bedside  Disposition Plan: Admitted. Likely SNF at discharge. Pending further recommendations from cardiolgoy  Consultants Cardiology Gastroenterology  Procedures  Echocardiogram  Antibiotics   Anti-infectives (From admission, onward)   None      Subjective:   Cindee Salt seen and examined today.  Has no complaints this morning.  Denies current chest pain,  shortness breath, abdominal pain, nausea vomiting, diarrhea or constipation.  Continues to have some weakness.  Objective:   Vitals:   05/29/17 0645 05/29/17 0651 05/29/17 0900 05/29/17 1214  BP: (!) 142/96 (!) 160/89 140/77 131/63  Pulse: (!) 160 91 82 74  Resp: _0 Temp:    98 F (36.7 C)  TempSrc:    Oral  SpO2: 98% 95% 98% 98%  Weight:      Height:        Intake/Output Summary (Last 24 hours) at 05/29/2017 1247 Last data filed at 05/29/2017 1014 Gross per 24 hour  Intake 0 ml  Output 350 ml  Net -350 ml   Filed Weights   05/27/17 0614 05/28/17 0456 05/29/17 0549  Weight: 83.8 kg (184 lb 12.8 oz) 83.2 kg (183 lb 8 oz) 85.4 kg (188 lb 4.8 oz)    Exam  General: Well developed, elderly, NAD, appears stated age  HEENT: NCAT, mucous membranes moist.   Cardiovascular: S1 S2 auscultated, RRR, no murmur  Respiratory: Mildly diminished breath sounds at the bases, otherwise clear  Abdomen: Soft, nontender, nondistended, + bowel sounds  Extremities: warm dry without cyanosis clubbing or edema  Neuro: AAOx3, nonfocal  Psych: appropriate mood and affect, pleasant   Data Reviewed: I have personally reviewed following labs and imaging studies  CBC: Recent Labs  Lab 05/26/17 1226 05/27/17 0617 05/28/17 0445 05/29/17 0652  WBC 9.8 9.1 11.8* 11.4*  NEUTROABS 6.3  --  7.2 6.6  HGB 12.8* 13.4 12.7* 12.5*  HCT 40.0 41.7 40.4 40.0  MCV 87.7 88.7 87.8 88.3  PLT 376 322 320 124   Basic Metabolic Panel: Recent Labs  Lab 05/26/17 1226 05/27/17 0617 05/27/17 1846 05/28/17 0445 05/29/17 0652  NA 143 145  --  140 137  K 3.6 3.6  --  3.9 4.1  CL 106 108  --  105 101  CO2 22 23  --  23 23  GLUCOSE 146* 95  --  110* 119*  BUN 31* 31*  --  31* 28*  CREATININE 1.47* 1.50*  --  1.37* 1.34*  CALCIUM 8.5* 8.4*  --  8.2* 8.4*  MG  --   --  2.1 2.0 2.0  PHOS  --   --   --  3.3 2.8   GFR: Estimated Creatinine Clearance: 46.7 mL/min (A) (by C-G formula based on  SCr of 1.34 mg/dL (H)). Liver Function Tests: Recent Labs  Lab 05/26/17 1226 05/27/17 0617 05/28/17 0445 05/29/17 0652  AST 23 32 21 29  ALT _0 ALKPHOS 61 56 54 60  BILITOT 0.9 0.9 0.9 0.9  PROT 5.8* 5.8* 5.4* 5.7*  ALBUMIN 2.9* 2.8* 2.5* 2.5*   No results for input(s): LIPASE, AMYLASE in the last 168 hours. No results for input(s): AMMONIA in the last 168 hours. Coagulation Profile: Recent Labs  Lab 05/26/17 1226  INR 1.65   Cardiac Enzymes: Recent Labs  Lab 05/26/17 1226 05/26/17 1812 05/26/17 2258 05/27/17 0617  TROPONINI 0.11* 0.13* 0.10* 0.10*   BNP (last 3 results) Recent Labs    04/18/17 1126 04/23/17 1048 05/06/17 1110  PROBNP 10,928* 10,848* 22,420*   HbA1C: No results for input(s): HGBA1C in the last 72 hours. CBG: Recent Labs  Lab 05/27/17 1613 05/27/17 2104 05/28/17 1144 05/28/17 1640 05/29/17 0601  GLUCAP 158* 119* 137* 119* 95   Lipid Profile: No results for input(s): CHOL, HDL, LDLCALC, TRIG, CHOLHDL, LDLDIRECT in the last 72 hours. Thyroid Function Tests: No results for input(s): TSH, T4TOTAL, FREET4, T3FREE, THYROIDAB in the last 72 hours. Anemia Panel: No results for input(s): VITAMINB12, FOLATE, FERRITIN, TIBC, IRON, RETICCTPCT in the last 72 hours. Urine analysis:    Component Value Date/Time   COLORURINE YELLOW 05/08/2017 [place] 05/08/2017 1353   LABSPEC 1.033 (H) 05/08/2017 1353   PHURINE 5.0 05/08/2017 1353   GLUCOSEU NEGATIVE 05/08/2017 1353   HGBUR NEGATIVE 05/08/2017 1353   BILIRUBINUR NEGATIVE 05/08/2017 1353   KETONESUR NEGATIVE 05/08/2017 1353   PROTEINUR NEGATIVE 05/08/2017 1353   UROBILINOGEN 0.2 03/08/2008 1519   NITRITE NEGATIVE 05/08/2017 1353   LEUKOCYTESUR NEGATIVE 05/08/2017 1353   Sepsis Labs: _1 (procalcitonin:4,lacticidven:4)  ) Recent Results (from the past 240 hour(s))  MRSA PCR Screening     Status: None   Collection Time: 05/27/17  8:08 AM  Result Value Ref  Range Status   MRSA by PCR NEGATIVE NEGATIVE Final    Comment:        The GeneXpert MRSA Assay (FDA approved for NASAL specimens only), is one component of a comprehensive MRSA colonization surveillance program. It is not intended to diagnose MRSA infection nor to guide or monitor treatment for MRSA infections. Performed at [facility], [place] [address], [place], [place] [address]   Gram stain     Status: None   Collection Time: 05/27/17  2:25 PM  Result Value Ref Range Status   Specimen Description FLUID RIGHT PLEURAL  Final   Special Requests   Final    NONE Performed at [facility], [place] [address] [place], [place] [address]    Gram Stain   Final    WBC PRESENT, PREDOMINANTLY PMN NO ORGANISMS SEEN CYTOSPIN SMEAR    Report Status 05/28/2017 FINAL  Final  Culture, body fluid-bottle  Status: None (Preliminary result)   Collection Time: 05/27/17  2:25 PM  Result Value Ref Range Status   Specimen Description FLUID RIGHT PLEURAL  Final   Special Requests BOTTLES DRAWN AEROBIC AND ANAEROBIC  Final   Culture   Final    NO GROWTH < 24 HOURS Performed at [facility], [place] [address] [place], [place] [address]    Report Status PENDING  Incomplete  Respiratory Panel by PCR     Status: None   Collection Time: 05/28/17 11:56 PM  Result Value Ref Range Status   Adenovirus NOT DETECTED NOT DETECTED Final   Coronavirus 229E NOT DETECTED NOT DETECTED Final   Coronavirus HKU1 NOT DETECTED NOT DETECTED Final   Coronavirus NL63 NOT DETECTED NOT DETECTED Final   Coronavirus OC43 NOT DETECTED NOT DETECTED Final   Metapneumovirus NOT DETECTED NOT DETECTED Final   Rhinovirus / Enterovirus NOT DETECTED NOT DETECTED Final   Influenza A NOT DETECTED NOT DETECTED Final   Influenza B NOT DETECTED NOT DETECTED Final   Parainfluenza Virus 1 NOT DETECTED NOT DETECTED Final   Parainfluenza Virus 2 NOT DETECTED NOT DETECTED Final   Parainfluenza Virus 3 NOT DETECTED NOT  DETECTED Final   Parainfluenza Virus 4 NOT DETECTED NOT DETECTED Final   Respiratory Syncytial Virus NOT DETECTED NOT DETECTED Final   Bordetella pertussis NOT DETECTED NOT DETECTED Final   Chlamydophila pneumoniae NOT DETECTED NOT DETECTED Final   Mycoplasma pneumoniae NOT DETECTED NOT DETECTED Final    Comment: Performed at [facility], [place] [address], [place], [place] [address]      Radiology Studies: Ct Abdomen Pelvis Wo Contrast  Result Date: 05/28/2017 CLINICAL DATA:  Nausea, vomiting. EXAM: CT ABDOMEN AND PELVIS WITHOUT CONTRAST TECHNIQUE: Multidetector CT imaging of the abdomen and pelvis was performed following the standard protocol without IV contrast. COMPARISON:  CT scan of May 09, 2017. FINDINGS: Lower chest: Moderate bilateral pleural effusions are noted with adjacent subsegmental atelectasis. Hepatobiliary: No focal liver abnormality is seen. No gallstones, gallbladder wall thickening, or biliary dilatation. Pancreas: Unremarkable. No pancreatic ductal dilatation or surrounding inflammatory changes. Spleen: Normal in size without focal abnormality. Adrenals/Urinary Tract: Adrenal glands are unremarkable. Kidneys are normal, without renal calculi, focal lesion, or hydronephrosis. Bladder is unremarkable. Stomach/Bowel: The stomach appears normal. Patient is status post appendectomy. There is no evidence of bowel obstruction or inflammation. Sigmoid diverticulosis is noted. Vascular/Lymphatic: Aortic atherosclerosis. No enlarged abdominal or pelvic lymph nodes. Reproductive: Prostate is unremarkable. Other: No abdominal wall hernia or abnormality. No abdominopelvic ascites. Musculoskeletal: No acute or significant osseous findings. IMPRESSION: Moderate bilateral pleural effusions with adjacent subsegmental atelectasis. Sigmoid diverticulosis without inflammation. No acute abnormality seen in the abdomen or pelvis. Aortic Atherosclerosis (ICD10-I70.0). Electronically Signed   By:  Marijo Conception, M.D.   On: 05/28/2017 14:06   Dg Chest 1 View  Result Date: 05/27/2017 CLINICAL DATA:  Shortness of breath. Pleural effusions. Status post right thoracentesis. EXAM: CHEST 1 VIEW COMPARISON:  Chest x-ray and CT scan dated 05/26/2017 FINDINGS: There has been marked reduction in the size of the right pleural effusion. There is a small right residual effusion and a small left effusion. Cardiomegaly. CABG. AICD in place. Pulmonary vascularity is normal. Aortic atherosclerosis. IMPRESSION: No pneumothorax after right thoracentesis. Small residual right effusion.  Small left effusion is unchanged. Aortic Atherosclerosis (ICD10-I70.0). Electronically Signed   By: Lorriane Shire M.D.   On: 05/27/2017 14:43   Dg Chest Port 1 View  Result Date:  05/28/2017 CLINICAL DATA:  Shortness of Breath EXAM: PORTABLE CHEST 1 VIEW COMPARISON:  05/27/2017 FINDINGS: Prior CABG. Left AICD is unchanged. Cardiomegaly with vascular congestion. Layering effusions and bilateral lower lobe opacities with worsening aeration in the lung bases since prior study. IMPRESSION: Cardiomegaly with vascular congestion. Increasing layering effusions and bibasilar atelectasis or infiltrates. Electronically Signed   By: Rolm Baptise M.D.   On: 05/28/2017 08:10   Ir Thoracentesis Asp Pleural Space W/img Guide  Result Date: 05/27/2017 INDICATION: Patient with right pleural effusion. Request is made for diagnostic and therapeutic thoracentesis. EXAM: ULTRASOUND GUIDED DIAGNOSTIC AND THERAPEUTIC THORACENTESIS MEDICATIONS: 10 mL 2% lidocaine COMPLICATIONS: None immediate. PROCEDURE: An ultrasound guided thoracentesis was thoroughly discussed with the patient and questions answered. The benefits, risks, alternatives and complications were also discussed. The patient understands and wishes to proceed with the procedure. Written consent was obtained. Ultrasound was performed to localize and mark an adequate pocket of fluid in the right  chest. The area was then prepped and draped in the normal sterile fashion. 2% Lidocaine was used for local anesthesia. Under ultrasound guidance a Safe-T-Centesis catheter was introduced. Thoracentesis was performed. The catheter was removed and a dressing applied. FINDINGS: A total of approximately 2.0 liters of fluid was removed. Samples were sent to the laboratory as requested by the clinical team. Procedure was stopped prior to removal of all fluid as this was patient's first thoracentesis. IMPRESSION: Successful ultrasound guided right thoracentesis yielding 2.0 liters of pleural fluid. Read by: Brynda Greathouse PA-C Electronically Signed   By: Sandi Mariscal M.D.   On: 05/27/2017 16:30     Scheduled Meds: . apixaban  2.5 mg Oral BID  . carvedilol  6.25 mg Oral BID WC  . famotidine  20 mg Oral QHS  . furosemide  20 mg Oral Daily  . hydrALAZINE  12.5 mg Oral TID  . isosorbide mononitrate  30 mg Oral QHS  . lactose free nutrition  237 mL Oral TID WC  . methimazole  10 mg Oral BID  . metoprolol tartrate  2.5 mg Intravenous Once  . pantoprazole  40 mg Oral QAC breakfast   Continuous Infusions:   LOS: 3 days   Time Spent in minutes   45 minutes  Kyndel Egger D.O. on 05/29/2017 at 12:47 PM  Between 7am to 7pm - Pager - [phone]  After 7pm go to [email or web address] - password [email or web address]  And look for the night coverage person covering for me after hours  [facility] Office  [phone]

## 2017-05-29 NOTE — Consult Note (Signed)
   [facility] Inpatient Consult   05/29/2017  [name] 17-Aug-1932 [id number]  Met with the patient regarding restart of [facility] services.  Patient is being recommending for skilled nursing.  Patient states his wife recently fell and has injuries and he prefers to go back home, "If I can my wife is getting home health, too.  I hope I can get back home.."  Patient agreeable to [facility] if he goes home, states let's see how it goes."  Will follow for disposition.  Natividad Brood, RN BSN [facility] Liaison  [phone] business mobile phone Toll free office [phone]

## 2017-05-29 NOTE — Progress Notes (Signed)
Pt had 37 beats of VTach. Pt denies chest pain, denies nausea and vomiting, not in respiratory distress. Asymptomatic. Lamar Blinks (NP on call) and Dr. Noralyn Pick (cardiology) was notified. No new orders given. Per Dr. Noralyn Pick to call him back again if the Tanna Furry is going to be frequent. Will continue to monitor pt.   05/29/17 0017  Vitals  Temp 98.6 F (37 C)  Temp Source Oral  BP (!) 116/57  BP Location Right Arm  BP Method Automatic  Patient Position (if appropriate) Lying  Pulse Rate 71  Resp 18  Oxygen Therapy  SpO2 96 %  O2 Device Room Air  Pain Assessment  Pain Assessment No/denies pain

## 2017-05-29 NOTE — Progress Notes (Addendum)
Progress Note  Patient Name: [name] Date of Encounter: 05/29/2017  Primary Cardiologist: [name], MD  Electrophysiologist: Dr. Curt Bears AHF: Dr. Haroldine Laws  Subjective   Less SOB this afternoon, no CP, palpitations  Inpatient Medications    Scheduled Meds: . apixaban  2.5 mg Oral BID  . carvedilol  6.25 mg Oral BID WC  . famotidine  20 mg Oral QHS  . furosemide  20 mg Oral Daily  . hydrALAZINE  12.5 mg Oral TID  . isosorbide mononitrate  30 mg Oral QHS  . lactose free nutrition  237 mL Oral TID WC  . methimazole  10 mg Oral BID  . metoprolol tartrate  2.5 mg Intravenous Once  . pantoprazole  40 mg Oral QAC breakfast   Continuous Infusions:  PRN Meds: acetaminophen, ipratropium-albuterol, ondansetron **OR** ondansetron (ZOFRAN) IV, oxyCODONE, promethazine, traMADol   Vital Signs    Vitals:   05/29/17 0549 05/29/17 0645 05/29/17 0651 05/29/17 0900  BP: 138/68 (!) 142/96 (!) 160/89 140/77  Pulse: 78 (!) 160 91 82  Resp: _0 Temp: 98.1 F (36.7 C)     TempSrc: Oral     SpO2: 95% 98% 95% 98%  Weight: 188 lb 4.8 oz (85.4 kg)     Height:        Intake/Output Summary (Last 24 hours) at 05/29/2017 1205 Last data filed at 05/29/2017 1014 Gross per 24 hour  Intake 0 ml  Output 350 ml  Net -350 ml   Filed Weights   05/27/17 0614 05/28/17 0456 05/29/17 0549  Weight: 184 lb 12.8 oz (83.8 kg) 183 lb 8 oz (83.2 kg) 188 lb 4.8 oz (85.4 kg)    Telemetry    SR, V paced generally, this AM, VT 6 minutes, rate about 140-150- Personally Reviewed  ECG    SR, V paced - Personally Reviewed  Physical Exam   GEN: No acute distress.   Neck: No JVD Cardiac: RRR, no murmurs, rubs, or gallops.  Respiratory: decreased at bases b/l . GI: Soft, nontender, non-distended  MS: trace edema; No deformity. Neuro:  Nonfocal  Psych: Normal affect   Labs    Chemistry Recent Labs  Lab 05/27/17 0617 05/28/17 0445 05/29/17 0652  NA 145 140 137  K 3.6 3.9  4.1  CL 108 105 101  CO2 _1 GLUCOSE 95 110* 119*  BUN 31* 31* 28*  CREATININE 1.50* 1.37* 1.34*  CALCIUM 8.4* 8.2* 8.4*  PROT 5.8* 5.4* 5.7*  ALBUMIN 2.8* 2.5* 2.5*  AST 32 21 29  ALT _2 ALKPHOS 56 54 60  BILITOT 0.9 0.9 0.9  GFRNONAA 42* 46* 48*  GFRAA 48* 54* 55*  ANIONGAP _3 Hematology Recent Labs  Lab 05/27/17 0617 05/28/17 0445 05/29/17 0652  WBC 9.1 11.8* 11.4*  RBC 4.70 4.60 4.53  HGB 13.4 12.7* 12.5*  HCT 41.7 40.4 40.0  MCV 88.7 87.8 88.3  MCH 28.5 27.6 27.6  MCHC 32.1 31.4 31.3  RDW 14.7 14.5 14.8  PLT 322 320 361    Cardiac Enzymes Recent Labs  Lab 05/26/17 1226 05/26/17 1812 05/26/17 2258 05/27/17 0617  TROPONINI 0.11* 0.13* 0.10* 0.10*   No results for input(s): TROPIPOC in the last 168 hours.   BNP Recent Labs  Lab 05/26/17 1226  BNP >4,500.0*     DDimer No results for input(s): DDIMER in the last 168 hours.   Radiology    Ct Abdomen  Pelvis Wo Contrast Result Date: 05/28/2017 CLINICAL DATA:  Nausea, vomiting. EXAM: CT ABDOMEN AND PELVIS WITHOUT CONTRAST TECHNIQUE: Multidetector CT imaging of the abdomen and pelvis was performed following the standard protocol without IV contrast. COMPARISON:  CT scan of May 09, 2017. FINDINGS: Lower chest: Moderate bilateral pleural effusions are noted with adjacent subsegmental atelectasis. Hepatobiliary: No focal liver abnormality is seen. No gallstones, gallbladder wall thickening, or biliary dilatation. Pancreas: Unremarkable. No pancreatic ductal dilatation or surrounding inflammatory changes. Spleen: Normal in size without focal abnormality. Adrenals/Urinary Tract: Adrenal glands are unremarkable. Kidneys are normal, without renal calculi, focal lesion, or hydronephrosis. Bladder is unremarkable. Stomach/Bowel: The stomach appears normal. Patient is status post appendectomy. There is no evidence of bowel obstruction or inflammation. Sigmoid diverticulosis is noted.  Vascular/Lymphatic: Aortic atherosclerosis. No enlarged abdominal or pelvic lymph nodes. Reproductive: Prostate is unremarkable. Other: No abdominal wall hernia or abnormality. No abdominopelvic ascites. Musculoskeletal: No acute or significant osseous findings. IMPRESSION: Moderate bilateral pleural effusions with adjacent subsegmental atelectasis. Sigmoid diverticulosis without inflammation. No acute abnormality seen in the abdomen or pelvis. Aortic Atherosclerosis (ICD10-I70.0). Electronically Signed   By: Marijo Conception, M.D.   On: 05/28/2017 14:06   Dg Chest Port 1 View Result Date: 05/28/2017 CLINICAL DATA:  Shortness of Breath EXAM: PORTABLE CHEST 1 VIEW COMPARISON:  05/27/2017 FINDINGS: Prior CABG. Left AICD is unchanged. Cardiomegaly with vascular congestion. Layering effusions and bilateral lower lobe opacities with worsening aeration in the lung bases since prior study. IMPRESSION: Cardiomegaly with vascular congestion. Increasing layering effusions and bibasilar atelectasis or infiltrates. Electronically Signed   By: Rolm Baptise M.D.   On: 05/28/2017 08:10   Ir Thoracentesis Asp Pleural Space W/img Guide Result Date: 05/27/2017 INDICATION: Patient with right pleural effusion. Request is made for diagnostic and therapeutic thoracentesis. EXAM: ULTRASOUND GUIDED DIAGNOSTIC AND THERAPEUTIC THORACENTESIS MEDICATIONS: 10 mL 2% lidocaine COMPLICATIONS: None immediate. PROCEDURE: An ultrasound guided thoracentesis was thoroughly discussed with the patient and questions answered. The benefits, risks, alternatives and complications were also discussed. The patient understands and wishes to proceed with the procedure. Written consent was obtained. Ultrasound was performed to localize and mark an adequate pocket of fluid in the right chest. The area was then prepped and draped in the normal sterile fashion. 2% Lidocaine was used for local anesthesia. Under ultrasound guidance a Safe-T-Centesis catheter was  introduced. Thoracentesis was performed. The catheter was removed and a dressing applied. FINDINGS: A total of approximately 2.0 liters of fluid was removed. Samples were sent to the laboratory as requested by the clinical team. Procedure was stopped prior to removal of all fluid as this was patient's first thoracentesis. IMPRESSION: Successful ultrasound guided right thoracentesis yielding 2.0 liters of pleural fluid. Read by: Brynda Greathouse PA-C Electronically Signed   By: Sandi Mariscal M.D.   On: 05/27/2017 16:30    Cardiac Studies   Echo 05/27/17: Study Conclusions - Left ventricle: The cavity size was normal. Wall thickness was increased in a pattern of mild LVH. Systolic function was severely reduced. The estimated ejection fraction was in the range of 20% to 25%. There is akinesis of the inferolateral, inferior, and inferoseptal myocardium. Features are consistent with a pseudonormal left ventricular filling pattern, with concomitant abnormal relaxation and increased filling pressure (grade 2 diastolic dysfunction). - Aortic valve: A pericardial tissue valve bioprosthesis was present and functioning normally. Peak velocity (S): 226 cm/s. Mean gradient (S): 10 mm Hg. Valve area (VTI): 1.52 cm^2. Valve area (Vmax): 1.5 cm^2. Valve area (Vmean): 1.59  cm^2. - Mitral valve: Calcified annulus. Mildly thickened leaflets . There was mild regurgitation. - Left atrium: The atrium was severely dilated. - Right ventricle: Pacer wire or catheter noted in right ventricle. - Pulmonary arteries: Systolic pressure was mildly increased. PA peak pressure: 35 mm Hg (S). Impressions: - Compared to the prior study, there has been no significant interval change.   Patient Profile     84 y.o. male w/PMHx of HTN, CAD, VHD w/ hx of CABG/bioprosthetic AVR, chronic CHF (combined), PAFib, orthostatic hypotension, VT, ICM w/ CRT-D, COPD, hyperthyroidism recently dx started on methimazole  (Feb1, 2019), discharged 05/10/17 with concerns of GIB with melena and weakness, failure to thrive, started on PPI, EGD w/non-bleeding erosions, and his Eliquis resumed/continued.  Readmitted 05/26/17 with SOB, profound weakness, unable to ambulate, poor PO intake, nausea, abdominal complaints, unintentional weight loss.  Admitted with CHF, hyperthyroidsm, anorexia.  EP is asked to revisit case 2/2 recurrent VT  AAD Hx: Amiodarone stopped appears 2/2 SOB/pulmonary concerns, also noted hyperthyroid Mexiletine started 04/18/17 (though took 2-3 weeks to get and start)  >>> stopped 05/26/17 2/2 GI complaints  Device information: MDT CRT-D, implanted 04/22/15 +VT history Jan 2019, noted to have had VT below detection, device was reprogrammed   Device interrogation Battery and lead testing are good 93% BiVe pacing This AM VT 6.05 minutes, 163m cycle length, 150bpm 05/22/17 VT with 4 ATP, last successful, 170bpm   Assessment & Plan    1. VT     Mexiletine stopped on admission (Dr. KCaryl Comes 2/2 anorexia and GI complaints     electrolytes are OK     Slow VT, noted by device in monitor zone, 150bpm, self terminated (?vagal maneuvers reported in chart) SBP 140's, patient was in bed and without symptoms  Mexiletine was on 2556mBID, the patient dates his GI upset, poor appetite to prior to the Mexiletine, and says today he was thinking about it and for several months has been having waxing/waning stomach upset and poor appetite.   Would not make any programming changes,  VT was hemodynamically stable (was at rest), will resume Mexiletine since onset of GI symptoms proceeded medicine by patient's account  Given pulmo concerns previously and new hyperthyroidism would try to avoid resuming amiodarone.  2. Acute CHF exacerbation, ICM     S/p thoracentesis 2.0L (right) on 05/27/17 Fluid negative minimally, cumulatively -97031mthough no intake reported for 2 days      cxr post thora (yesterday) read  worse     C/w primary cardiology team     Has seen AHF fairly recently out patient     This afternoon feels little better, less SOB  3. CAD     Not felt to have any acute issue  4. VHD w/AVR     Stable by echo  5. Paroxysmal AFib     CHA2DS2Vasc is 6, on Eliquis (1.3-1.5), currently dosed at 2.5, follow  6, hyperthyoridism     C/w medicine team  7. Anorexia, GI complaints     W/u in process with medicine team     For questions or updates, please contact [facility] consult [email or web address] for contact info under Cardiology/STEMI.      Signed, RenBaldwin JamaicaA-C  05/29/2017, 12:05 PM    EP Attending  Patient seen and examined. Agree with the findings as noted above. He has had recurrent VT in the setting of cardiomyopathy and in the setting of recent stopping of his mexilitine. At this point I  would suggest restarting mexitil. Unclear if this is really causing his symptoms or rather failure to thrive from a low output state. He thinks that his appetite has been poor prior to starting mexilitine. Would restart at 200 bid. Dr. Curt Bears to followup.  Mikle Bosworth.D.

## 2017-05-29 NOTE — Significant Event (Signed)
Rapid Response Event Note  Overview: Called by RN about sustained VT.  Initial Focused Assessment: When I arrived, patient was in the sustained VT in the 170s, patient was alert and awake, able to follow commands. SBP in the 140s, patient was able to bear down and with vagal maneuvers, HR improved into the 80-90s.  Interventions: -- Vagal maneuvers  Plan of Care (if not transferred): -- RN already paged CARDS MD and TRH NP, CARDS MD to come see patient first thing this morning.  Event Summary:   at    End Time [name]  [name]

## 2017-05-29 NOTE — Progress Notes (Signed)
OT Cancellation Note  Patient Details Name: [name] MRN: [id number] DOB: Oct 27, 1932   Cancelled Treatment:    Reason Eval/Treat Not Completed: Medical issues which prohibited therapy. Pt with sustained VT in the 170s earlier this morning with rapid response initiated. Discussed with nurse to hold OT evaluation for today, workup still underway. Plan to reattempt tomorrow.   Tyrone Schimke OTR/L Pager: [phone]  05/29/2017, 11:43 AM

## 2017-05-29 NOTE — Progress Notes (Signed)
Patient resting , no complaints of any pain or discomfort for the whole shift.

## 2017-05-29 NOTE — Progress Notes (Signed)
Pharmacist Heart Failure Core Measure Documentation  Assessment: [name] has an EF documented as 25% by ECHO 2/19  Rationale: Heart failure patients with left ventricular systolic dysfunction (LVSD) and an EF < 40% should be prescribed an angiotensin converting enzyme inhibitor (ACEI) or angiotensin receptor blocker (ARB) at discharge unless a contraindication is documented in the medical record.  This patient is not currently on an ACEI or ARB for HF.  This note is being placed in the record in order to provide documentation that a contraindication to the use of these agents is present for this encounter.  ACE Inhibitor or Angiotensin Receptor Blocker is contraindicated (specify all that apply)  _0   ACEI allergy AND ARB allergy _1   Angioedema _2   Moderate or severe aortic stenosis _3   Hyperkalemia _4   Hypotension _5   Renal artery stenosis _6   Worsening renal function, preexisting renal disease or dysfunction   Bonnita Nasuti Pharm.D. CPP, BCPS Clinical Pharmacist [phone] 05/29/2017 11:49 AM

## 2017-05-30 ENCOUNTER — Inpatient Hospital Stay (HOSPITAL_COMMUNITY): Payer: Medicare HMO

## 2017-05-30 LAB — GLUCOSE, CAPILLARY: GLUCOSE-CAPILLARY: 116 mg/dL — AB (ref 65–99)

## 2017-05-30 LAB — CBC
HEMATOCRIT: 37 % — AB (ref 39.0–52.0)
Hemoglobin: 11.8 g/dL — ABNORMAL LOW (ref 13.0–17.0)
MCH: 27.8 pg (ref 26.0–34.0)
MCHC: 31.9 g/dL (ref 30.0–36.0)
MCV: 87.1 fL (ref 78.0–100.0)
PLATELETS: 305 10*3/uL (ref 150–400)
RBC: 4.25 MIL/uL (ref 4.22–5.81)
RDW: 14.5 % (ref 11.5–15.5)
WBC: 10 10*3/uL (ref 4.0–10.5)

## 2017-05-30 LAB — BASIC METABOLIC PANEL
Anion gap: 10 (ref 5–15)
BUN: 24 mg/dL — ABNORMAL HIGH (ref 6–20)
CALCIUM: 8 mg/dL — AB (ref 8.9–10.3)
CO2: 23 mmol/L (ref 22–32)
Chloride: 104 mmol/L (ref 101–111)
Creatinine, Ser: 1.22 mg/dL (ref 0.61–1.24)
GFR, EST NON AFRICAN AMERICAN: 53 mL/min — AB (ref >60)
Glucose, Bld: 94 mg/dL (ref 65–99)
Potassium: 4 mmol/L (ref 3.5–5.1)
Sodium: 137 mmol/L (ref 135–145)

## 2017-05-30 LAB — MAGNESIUM: Magnesium: 2.1 mg/dL (ref 1.7–2.4)

## 2017-05-30 MED ORDER — SODIUM CHLORIDE 0.9% FLUSH
3.0000 mL | Freq: Two times a day (BID) | INTRAVENOUS | Status: DC
  Administered 2017-05-30: 3 mL via INTRAVENOUS

## 2017-05-30 MED ORDER — SODIUM CHLORIDE 0.9 % IV SOLN
INTRAVENOUS | Status: DC
  Administered 2017-05-31: 07:00:00 via INTRAVENOUS

## 2017-05-30 MED ORDER — ENSURE ENLIVE PO LIQD
237.0000 mL | Freq: Two times a day (BID) | ORAL | Status: DC
  Administered 2017-05-31 – 2017-06-02 (×4): 237 mL via ORAL

## 2017-05-30 MED ORDER — ASPIRIN 81 MG PO CHEW
81.0000 mg | CHEWABLE_TABLET | ORAL | Status: AC
  Administered 2017-05-31: 81 mg via ORAL
  Filled 2017-05-30: qty 1

## 2017-05-30 MED ORDER — SODIUM CHLORIDE 0.9% FLUSH: 3.0000 mL | INTRAVENOUS | Status: DC | PRN

## 2017-05-30 MED ORDER — TECHNETIUM TC 99M SULFUR COLLOID
1.8000 | Freq: Once | INTRAVENOUS | Status: AC | PRN
  Administered 2017-05-30: 1.8 via ORAL

## 2017-05-30 MED ORDER — SODIUM CHLORIDE 0.9 % IV SOLN: 250.0000 mL | INTRAVENOUS | Status: DC | PRN

## 2017-05-30 MED ORDER — TECHNETIUM TC 99M SULFUR COLLOID: 1.8000 | Freq: Once | INTRAVENOUS | Status: DC | PRN

## 2017-05-30 NOTE — Progress Notes (Signed)
PROGRESS NOTE    [name]  ZOX:[id number] DOB: [birth year] DOA: 05/26/2017 PCP: [name], MD   Chief Complaint  Patient presents with  . Shortness of Breath  . Weakness    Brief Narrative:  HPI On 05/26/2017 by Dr. Bonnell Public [name] is an 84 y.o. male with past medical history significant for cardiomyopathy with an EF of 25% with ICD in place, atrial fibrillation, COPD, status post AVR, postural hypotension, DM who was discharged 15 days ago when he presented for for increasing weakness, weight loss and shortness of breath. Workup revealed hyperthyroidism which was thought to be secondary to amiodarone. Patient was started on methimazole and discharged home for follow-up with endocrinology.  Patient now presents stating that he did well for about 2 days after discharge and "it started getting bad again". Patient's main complaints are profound difficulty with ambulation. He describes a feeling of "feeling sick in my stomach" every time he tries to walk. He is also extremely short of breath with ambulation. Patient notes these symptoms have been present pretty much without change for the past 2 weeks however he came in today because his children forced him to come because he was unable to get off the couch because he was so weak. Patient does admit to some orthopnea which is also been going on for the past couple of weeks. Patient notes he is unable to walk across even a couple of steps due to discomfort in his abdominal area and shortness of breath.  Patient also complains of difficulty eating although it's unclear to me whether this is dysphagia, anorexia or gastric parous cyst. He complains nausea and a discomfort and abdominal area almost all the time but again it's worse with any exertion. Patient denies any fevers or chills but does admit to sweats during the day which have been present for several months. These are been attributed to hyperthyroidism. He also notes  a 20 pound weight loss over the past couple of weeks which he also mentioned on last admission last month. Patient also notes he's had several episodes of paroxysmal coughing such that he felt that he was cannot vomit. These have been occurring over the past several weeks. These are entirely nonproductive without hemoptysis or any phlegm production.  Interim history  Patient admitted for right pleural effusion, had thoracentesis on 05/27/2017.  Cardiology consulted and appreciated.  Patient also has sustained V. tach this morning approximately 7 minutes.  Patient also with significant nausea, CT abdomen and pelvis obtained.  GI consulted. Pending gastric emptying study today. Assessment & Plan   Ventricular tachycardia -Rapid response was called the morning of 05/29/2017 for VT, patient was instructed to perform vagal maneuvers, his tachycardia eventually broke. -patient has remained asymptomatic -potassium 4.1, magnesium 2 (labs pending this morning) -cardiology possibly considering catheterization prior starting inotropic therapy (in PN on 2/19) -cardiology restarted Mexiletine 243m BID  Acute on chronic systolic/diastolic heart failure -Echocardiogram showed an EF of 281-19% grade 2 diastolic dysfunction -BNP on admission greater than 4500 -Cardiology consulted and appreciated -Patient currently on oral Lasix 20 mg daily -Monitor intake and output, daily weights -Continue coreg -Not on ACE inhibitor or ARB due to renal insufficiency  Elevated troponin -Troponin 0.13, 0.10, 0.10 -Cardiology consulted and appreciated -Echocardiogram as above  Right pleural effusion -Noted on chest x-ray 05/26/2017 -Interventional radiology consulted and appreciated, Eliquis was held -Status post thoracentesis, 2 L drained -Fluid culture shows no growth, Gram stain showed no organisms  Atrial fibrillation,  paroxysmal -Continue Coreg -Currently rate controlled -Continue Eliquis  Abdominal  discomfort -No longer complaining of abdominal pain, etiology unclear -CT abdomen pelvis showed moderate bilateral pleural effusions with adjacent subsegmental atelectasis.  Sigmoid diverticulosis without inflammation.  No acute abnormality seen in the abdomen or pelvis. -mexiletine can be associated with GI discomfort, initially held however restarted by cardiology for VT -?gastroparesis -GI consulted and appreciated, recommended gastric emptying study and possible trial of promethazine.  Patient may need erythromycin/Reglan however pending further recommendations from cardiology given these medications can prolong QT -gastric emptying study pending today  Hyperthyroidism -TSH less than 0.010, T3 4.2, free T4 4.01 -Continue methimazole -Patient follows with endocrinology, Dr. Cruzita Lederer   Coughing -Suspect secondary to pleural effusion -Respiratory viral panel negative -continue antitussives  Dyspnea -Likely multifactorial including pleural effusion versus CHF versus atrial fibrillation -Patient also with history of COPD -Continue neb treatments as well as Lasix  Essential hypertension -Continue Coreg, hydralazine, Lasix  Chronic kidney disease, stage III -Pending BMP this morning  CAD -Status post CABG -Currently chest pain-free -continue coreg, imdur -Statin discontinued  GERD/history of Barrett's esophagus -Continue Protonix/pepcid  Generalized weakness and fatigue/failure to thrive -Patient has had weight loss and shortness of breath for quite some time -Possibly secondary to the above items -Will continue to monitor and treat accordingly -PT recommended SNF, social work consulted  DVT Prophylaxis  Eliquis  Code Status: Full  Family Communication: none at bedside  Disposition Plan: Admitted. Likely SNF at discharge. Pending further recommendations from cardiology and gastroenterology.  Patient pending gastric emptying study  today.  Consultants Cardiology Gastroenterology  Procedures  Echocardiogram  Antibiotics   Anti-infectives (From admission, onward)   None      Subjective:   [name] seen and examined today.  Patient has no complaints this morning.  Denies abdominal pain at this time.  Denies current chest pain, shortness breath, nausea or vomiting, headache or dizziness.  Objective:   Vitals:   05/29/17 2046 05/29/17 2248 05/30/17 0543 05/30/17 0559  BP: 129/67 125/76  (!) 159/83  Pulse: 71   86  Resp: 18   18  Temp: 97.8 F (36.6 C)   98 F (36.7 C)  TempSrc: Oral   Oral  SpO2: 97%   98%  Weight:   85.1 kg (187 lb 11.2 oz)   Height:        Intake/Output Summary (Last 24 hours) at 05/30/2017 1118 Last data filed at 05/30/2017 0544 Gross per 24 hour  Intake 600 ml  Output 350 ml  Net 250 ml   Filed Weights   05/28/17 0456 05/29/17 0549 05/30/17 0543  Weight: 83.2 kg (183 lb 8 oz) 85.4 kg (188 lb 4.8 oz) 85.1 kg (187 lb 11.2 oz)   Exam  General: Well developed, elderly, no apparent distress  HEENT: NCAT, mucous membranes moist.   Neck: Supple  Cardiovascular: S1 S2 auscultated, irregular  Respiratory: Diminished breath sounds however clear with occasional nonproductive cough  Abdomen: Soft, nontender, nondistended, + bowel sounds  Extremities: warm dry without cyanosis clubbing or edema  Neuro: AAOx3, nonfocal  Psych: Normal affect and demeanor with intact judgement and insight   Data Reviewed: I have personally reviewed following labs and imaging studies  CBC: Recent Labs  Lab 05/26/17 1226 05/27/17 0617 05/28/17 0445 05/29/17 0652 05/30/17 0457  WBC 9.8 9.1 11.8* 11.4* 10.0  NEUTROABS 6.3  --  7.2 6.6  --   HGB 12.8* 13.4 12.7* 12.5* 11.8*  HCT 40.0 41.7 40.4 40.0  37.0*  MCV 87.7 88.7 87.8 88.3 87.1  PLT 376 322 320 361 563   Basic Metabolic Panel: Recent Labs  Lab 05/26/17 1226 05/27/17 0617 05/27/17 1846 05/28/17 0445 05/29/17 0652  NA  143 145  --  140 137  K 3.6 3.6  --  3.9 4.1  CL 106 108  --  105 101  CO2 22 23  --  23 23  GLUCOSE 146* 95  --  110* 119*  BUN 31* 31*  --  31* 28*  CREATININE 1.47* 1.50*  --  1.37* 1.34*  CALCIUM 8.5* 8.4*  --  8.2* 8.4*  MG  --   --  2.1 2.0 2.0  PHOS  --   --   --  3.3 2.8   GFR: Estimated Creatinine Clearance: 46.7 mL/min (A) (by C-G formula based on SCr of 1.34 mg/dL (H)). Liver Function Tests: Recent Labs  Lab 05/26/17 1226 05/27/17 0617 05/28/17 0445 05/29/17 0652  AST 23 32 21 29  ALT _0 ALKPHOS 61 56 54 60  BILITOT 0.9 0.9 0.9 0.9  PROT 5.8* 5.8* 5.4* 5.7*  ALBUMIN 2.9* 2.8* 2.5* 2.5*   No results for input(s): LIPASE, AMYLASE in the last 168 hours. No results for input(s): AMMONIA in the last 168 hours. Coagulation Profile: Recent Labs  Lab 05/26/17 1226  INR 1.65   Cardiac Enzymes: Recent Labs  Lab 05/26/17 1226 05/26/17 1812 05/26/17 2258 05/27/17 0617  TROPONINI 0.11* 0.13* 0.10* 0.10*   BNP (last 3 results) Recent Labs    04/18/17 1126 04/23/17 1048 05/06/17 1110  PROBNP 10,928* 10,848* 22,420*   HbA1C: No results for input(s): HGBA1C in the last 72 hours. CBG: Recent Labs  Lab 05/27/17 2104 05/28/17 1144 05/28/17 1640 05/29/17 0601 05/30/17 0628  GLUCAP 119* 137* 119* 95 116*   Lipid Profile: No results for input(s): CHOL, HDL, LDLCALC, TRIG, CHOLHDL, LDLDIRECT in the last 72 hours. Thyroid Function Tests: No results for input(s): TSH, T4TOTAL, FREET4, T3FREE, THYROIDAB in the last 72 hours. Anemia Panel: No results for input(s): VITAMINB12, FOLATE, FERRITIN, TIBC, IRON, RETICCTPCT in the last 72 hours. Urine analysis:    Component Value Date/Time   COLORURINE YELLOW 05/08/2017 [place] 05/08/2017 1353   LABSPEC 1.033 (H) 05/08/2017 1353   PHURINE 5.0 05/08/2017 1353   GLUCOSEU NEGATIVE 05/08/2017 1353   HGBUR NEGATIVE 05/08/2017 1353   BILIRUBINUR NEGATIVE 05/08/2017 1353   KETONESUR NEGATIVE  05/08/2017 1353   PROTEINUR NEGATIVE 05/08/2017 1353   UROBILINOGEN 0.2 03/08/2008 1519   NITRITE NEGATIVE 05/08/2017 1353   LEUKOCYTESUR NEGATIVE 05/08/2017 1353   Sepsis Labs: _1 (procalcitonin:4,lacticidven:4)  ) Recent Results (from the past 240 hour(s))  MRSA PCR Screening     Status: None   Collection Time: 05/27/17  8:08 AM  Result Value Ref Range Status   MRSA by PCR NEGATIVE NEGATIVE Final    Comment:        The GeneXpert MRSA Assay (FDA approved for NASAL specimens only), is one component of a comprehensive MRSA colonization surveillance program. It is not intended to diagnose MRSA infection nor to guide or monitor treatment for MRSA infections. Performed at [facility], [place] [address] [place], [place] [address]   Gram stain     Status: None   Collection Time: 05/27/17  2:25 PM  Result Value Ref Range Status   Specimen Description FLUID RIGHT PLEURAL  Final   Special Requests   Final    NONE  Performed at [facility], [place] [address], [place], [place] [address]    Gram Stain   Final    WBC PRESENT, PREDOMINANTLY PMN NO ORGANISMS SEEN CYTOSPIN SMEAR    Report Status 05/28/2017 FINAL  Final  Culture, body fluid-bottle     Status: None (Preliminary result)   Collection Time: 05/27/17  2:25 PM  Result Value Ref Range Status   Specimen Description FLUID RIGHT PLEURAL  Final   Special Requests BOTTLES DRAWN AEROBIC AND ANAEROBIC  Final   Culture   Final    NO GROWTH 2 DAYS Performed at [facility], [place] [address], [place], [place] [address]    Report Status PENDING  Incomplete  Respiratory Panel by PCR     Status: None   Collection Time: 05/28/17 11:56 PM  Result Value Ref Range Status   Adenovirus NOT DETECTED NOT DETECTED Final   Coronavirus 229E NOT DETECTED NOT DETECTED Final   Coronavirus HKU1 NOT DETECTED NOT DETECTED Final   Coronavirus NL63 NOT DETECTED NOT DETECTED Final   Coronavirus OC43 NOT DETECTED NOT DETECTED  Final   Metapneumovirus NOT DETECTED NOT DETECTED Final   Rhinovirus / Enterovirus NOT DETECTED NOT DETECTED Final   Influenza A NOT DETECTED NOT DETECTED Final   Influenza B NOT DETECTED NOT DETECTED Final   Parainfluenza Virus 1 NOT DETECTED NOT DETECTED Final   Parainfluenza Virus 2 NOT DETECTED NOT DETECTED Final   Parainfluenza Virus 3 NOT DETECTED NOT DETECTED Final   Parainfluenza Virus 4 NOT DETECTED NOT DETECTED Final   Respiratory Syncytial Virus NOT DETECTED NOT DETECTED Final   Bordetella pertussis NOT DETECTED NOT DETECTED Final   Chlamydophila pneumoniae NOT DETECTED NOT DETECTED Final   Mycoplasma pneumoniae NOT DETECTED NOT DETECTED Final    Comment: Performed at [facility], [place] [address] [place], [place] [address]      Radiology Studies: Ct Abdomen Pelvis Wo Contrast  Result Date: 05/28/2017 CLINICAL DATA:  Nausea, vomiting. EXAM: CT ABDOMEN AND PELVIS WITHOUT CONTRAST TECHNIQUE: Multidetector CT imaging of the abdomen and pelvis was performed following the standard protocol without IV contrast. COMPARISON:  CT scan of May 09, 2017. FINDINGS: Lower chest: Moderate bilateral pleural effusions are noted with adjacent subsegmental atelectasis. Hepatobiliary: No focal liver abnormality is seen. No gallstones, gallbladder wall thickening, or biliary dilatation. Pancreas: Unremarkable. No pancreatic ductal dilatation or surrounding inflammatory changes. Spleen: Normal in size without focal abnormality. Adrenals/Urinary Tract: Adrenal glands are unremarkable. Kidneys are normal, without renal calculi, focal lesion, or hydronephrosis. Bladder is unremarkable. Stomach/Bowel: The stomach appears normal. Patient is status post appendectomy. There is no evidence of bowel obstruction or inflammation. Sigmoid diverticulosis is noted. Vascular/Lymphatic: Aortic atherosclerosis. No enlarged abdominal or pelvic lymph nodes. Reproductive: Prostate is unremarkable. Other: No abdominal  wall hernia or abnormality. No abdominopelvic ascites. Musculoskeletal: No acute or significant osseous findings. IMPRESSION: Moderate bilateral pleural effusions with adjacent subsegmental atelectasis. Sigmoid diverticulosis without inflammation. No acute abnormality seen in the abdomen or pelvis. Aortic Atherosclerosis (ICD10-I70.0). Electronically Signed   By: Marijo Conception, M.D.   On: 05/28/2017 14:06     Scheduled Meds: . apixaban  2.5 mg Oral BID  . carvedilol  6.25 mg Oral BID WC  . famotidine  20 mg Oral QHS  . furosemide  20 mg Oral Daily  . hydrALAZINE  12.5 mg Oral TID  . isosorbide mononitrate  30 mg Oral QHS  . lactose free nutrition  237 mL Oral TID WC  .  methimazole  10 mg Oral BID  . metoprolol tartrate  2.5 mg Intravenous Once  . mexiletine  250 mg Oral Q12H  . pantoprazole  40 mg Oral QAC breakfast   Continuous Infusions:   LOS: 4 days   Time Spent in minutes  30 minutes  Kamar Callender D.O. on 05/30/2017 at 11:18 AM  Between 7am to 7pm - Pager - [phone]  After 7pm go to [email or web address] - password [email or web address]  And look for the night coverage person covering for me after hours  [facility] Office  [phone]

## 2017-05-30 NOTE — Progress Notes (Signed)
OT Cancellation Note  Patient Details Name: [name] MRN: [id number] DOB: 1932/07/08   Cancelled Treatment:    Reason Eval/Treat Not Completed: Patient at procedure or test/ unavailable. Pt off the floor for gastric emptying. Will follow-up as schedule permits.  Solen 05/30/2017, 10:58 AM  Hulda Humphrey OTR/L [phone]

## 2017-05-30 NOTE — Care Management Important Message (Signed)
Important Message  Patient Details  Name: [name] MRN: [id number] Date of Birth: July 04, 1932   Medicare Important Message Given:  Yes    Katyra Tomassetti 05/30/2017, 12:12 PM

## 2017-05-30 NOTE — Progress Notes (Signed)
Patient had a 5 beat run of v-tach this morning. Patient was asymptomatic and vitals are stable. Will continue to monitor.   Drue Flirt, RN

## 2017-05-30 NOTE — H&P (View-Only) (Signed)
Progress Note  Patient Name: [name] Date of Encounter: 05/30/2017  Primary Cardiologist: [name], MD   Subjective   No sustained runs of VT today.  He remains weak with nausea.  Gastric emptying study was normal.  He continues to have cough, SOB weakness  Inpatient Medications    Scheduled Meds: . apixaban  2.5 mg Oral BID  . carvedilol  6.25 mg Oral BID WC  . famotidine  20 mg Oral QHS  . furosemide  20 mg Oral Daily  . hydrALAZINE  12.5 mg Oral TID  . isosorbide mononitrate  30 mg Oral QHS  . lactose free nutrition  237 mL Oral TID WC  . methimazole  10 mg Oral BID  . metoprolol tartrate  2.5 mg Intravenous Once  . mexiletine  250 mg Oral Q12H  . pantoprazole  40 mg Oral QAC breakfast   Continuous Infusions:  PRN Meds: acetaminophen, benzonatate, ipratropium-albuterol, ondansetron **OR** ondansetron (ZOFRAN) IV, oxyCODONE, promethazine, traMADol   Vital Signs    Vitals:   05/29/17 2046 05/29/17 2248 05/30/17 0543 05/30/17 0559  BP: 129/67 125/76  (!) 159/83  Pulse: 71   86  Resp: 18   18  Temp: 97.8 F (36.6 C)   98 F (36.7 C)  TempSrc: Oral   Oral  SpO2: 97%   98%  Weight:   187 lb 11.2 oz (85.1 kg)   Height:        Intake/Output Summary (Last 24 hours) at 05/30/2017 1447 Last data filed at 05/30/2017 0544 Gross per 24 hour  Intake 360 ml  Output 350 ml  Net 10 ml   Filed Weights   05/28/17 0456 05/29/17 0549 05/30/17 0543  Weight: 183 lb 8 oz (83.2 kg) 188 lb 4.8 oz (85.4 kg) 187 lb 11.2 oz (85.1 kg)    Telemetry    NSVT - Personally Reviewed  ECG    V paced rhythm - Personally Reviewed  Physical Exam   GEN:   Frail appearing  Neck: Positive JVD Cardiac:   RRR, 2/6 apical systolic murmur, no diastolic murmurs, rubs, or gallops.  Respiratory:    Bilateral basilar crackles GI: Soft, nontender, non-distended, normal bowel sounds  MS:  No edema; No deformity. Neuro:   Nonfocal  Psych: Oriented and appropriate    Labs      Chemistry Recent Labs  Lab 05/27/17 0617 05/28/17 0445 05/29/17 0652 05/30/17 0457  NA 145 140 137 137  K 3.6 3.9 4.1 4.0  CL 108 105 101 104  CO2 _0 GLUCOSE 95 110* 119* 94  BUN 31* 31* 28* 24*  CREATININE 1.50* 1.37* 1.34* 1.22  CALCIUM 8.4* 8.2* 8.4* 8.0*  PROT 5.8* 5.4* 5.7*  --   ALBUMIN 2.8* 2.5* 2.5*  --   AST 32 21 29  --   ALT _1 --   ALKPHOS 56 54 60  --   BILITOT 0.9 0.9 0.9  --   GFRNONAA 42* 46* 48* 53*  GFRAA 48* 54* 55* >60  ANIONGAP _2 Hematology Recent Labs  Lab 05/28/17 0445 05/29/17 0652 05/30/17 0457  WBC 11.8* 11.4* 10.0  RBC 4.60 4.53 4.25  HGB 12.7* 12.5* 11.8*  HCT 40.4 40.0 37.0*  MCV 87.8 88.3 87.1  MCH 27.6 27.6 27.8  MCHC 31.4 31.3 31.9  RDW 14.5 14.8 14.5  PLT 320 361 305    Cardiac Enzymes Recent Labs  Lab 05/26/17  1226 05/26/17 1812 05/26/17 2258 05/27/17 0617  TROPONINI 0.11* 0.13* 0.10* 0.10*   No results for input(s): TROPIPOC in the last 168 hours.   BNP Recent Labs  Lab 05/26/17 1226  BNP >4,500.0*     DDimer No results for input(s): DDIMER in the last 168 hours.   Lab Results  Component Value Date   TSH <0.010 (L) 05/26/2017      Radiology    Nm Gastric Emptying  Result Date: 05/30/2017 CLINICAL DATA:  Severe nausea and dry heaves since the summer of 2019. EXAM: NUCLEAR MEDICINE GASTRIC EMPTYING SCAN TECHNIQUE: After oral ingestion of radiolabeled meal, sequential abdominal images were obtained for 4 hours. Percentage of activity emptying the stomach was calculated at 1 hour, 2 hour, 3 hour, and 4 hours. RADIOPHARMACEUTICALS:  1.8 mCi Tc-70msulfur colloid in standardized meal COMPARISON:  None. FINDINGS: Expected location of the stomach in the left upper quadrant. Ingested meal empties the stomach gradually over the course of the study. 38.7 emptied at 1 hr ( normal >= 10%) 78.6 emptied at 2 hr ( normal >= 40%) 88.7 emptied at 3 hr ( normal >= 70%) 93.2 emptied at 4 hr (  normal >= 90%) IMPRESSION: Normal gastric emptying study. Electronically Signed   By: TInge RiseM.D.   On: 05/30/2017 14:22    Cardiac Studies   Echo 05/27/17: Study Conclusions - Left ventricle: The cavity size was normal. Wall thickness was   increased in a pattern of mild LVH. Systolic function was   severely reduced. The estimated ejection fraction was in the   range of 20% to 25%. There is akinesis of the inferolateral,   inferior, and inferoseptal myocardium. Features are consistent   with a pseudonormal left ventricular filling pattern, with   concomitant abnormal relaxation and increased filling pressure   (grade 2 diastolic dysfunction). - Aortic valve: A pericardial tissue valve bioprosthesis was   present and functioning normally. Peak velocity (S): 226 cm/s.   Mean gradient (S): 10 mm Hg. Valve area (VTI): 1.52 cm^2. Valve   area (Vmax): 1.5 cm^2. Valve area (Vmean): 1.59 cm^2. - Mitral valve: Calcified annulus. Mildly thickened leaflets .   There was mild regurgitation. - Left atrium: The atrium was severely dilated. - Right ventricle: Pacer wire or catheter noted in right ventricle. - Pulmonary arteries: Systolic pressure was mildly increased. PA   peak pressure: 35 mm Hg (S).  Impressions: - Compared to the prior study, there has been no significant interval change.  Patient Profile     84y.o. male with multiple medica problems.  History of AVR/CABG,  CM with CRTD.  Recent hyperthyroidism on amiodarone.  We were called to see for acute on chronic CHF.   Assessment & Plan    VENTRICULAR TACHYCARDIA:  No further sustained episodes since Mexiletine.  AVR:  Normal function with AVR.     Acute on chronic systolic heart failure:  Symptoms could all be related to his thyroid. However, with worsening Vtach and constellation of symptoms this could be low output failure.  I have reviewed the pleural fluid labs.  I don't see serum LDH or pleural fluid protein.   However, pleural fluid LDH would not suggest exudate and this is likely transudate.  Would suggest repeat CXR before discharge.  To further try to understand whether his symptoms and effusions are related to his thyroid or low output failure I have suggested a right and left heart cath.  Discussed with Dr. BHaroldine Lawswho agrees  to do this tomorrow.     PAF:  On Eliquis.  Hold for right and left heart cath.    CRT - D:  He had interrogation yesterday.  NL function.  meds restarted as above.    For questions or updates, please contact [place] Please consult [email or web address] for contact info under Cardiology/STEMI.      Signed, Minus Breeding, MD  05/30/2017, 2:47 PM

## 2017-05-30 NOTE — Progress Notes (Signed)
Progress Note  Patient Name: [name] Date of Encounter: 05/30/2017  Primary Cardiologist: [name], MD   Subjective   No sustained runs of VT today.  He remains weak with nausea.  Gastric emptying study was normal.  He continues to have cough, SOB weakness  Inpatient Medications    Scheduled Meds: . apixaban  2.5 mg Oral BID  . carvedilol  6.25 mg Oral BID WC  . famotidine  20 mg Oral QHS  . furosemide  20 mg Oral Daily  . hydrALAZINE  12.5 mg Oral TID  . isosorbide mononitrate  30 mg Oral QHS  . lactose free nutrition  237 mL Oral TID WC  . methimazole  10 mg Oral BID  . metoprolol tartrate  2.5 mg Intravenous Once  . mexiletine  250 mg Oral Q12H  . pantoprazole  40 mg Oral QAC breakfast   Continuous Infusions:  PRN Meds: acetaminophen, benzonatate, ipratropium-albuterol, ondansetron **OR** ondansetron (ZOFRAN) IV, oxyCODONE, promethazine, traMADol   Vital Signs    Vitals:   05/29/17 2046 05/29/17 2248 05/30/17 0543 05/30/17 0559  BP: 129/67 125/76  (!) 159/83  Pulse: 71   86  Resp: 18   18  Temp: 97.8 F (36.6 C)   98 F (36.7 C)  TempSrc: Oral   Oral  SpO2: 97%   98%  Weight:   187 lb 11.2 oz (85.1 kg)   Height:        Intake/Output Summary (Last 24 hours) at 05/30/2017 1447 Last data filed at 05/30/2017 0544 Gross per 24 hour  Intake 360 ml  Output 350 ml  Net 10 ml   Filed Weights   05/28/17 0456 05/29/17 0549 05/30/17 0543  Weight: 183 lb 8 oz (83.2 kg) 188 lb 4.8 oz (85.4 kg) 187 lb 11.2 oz (85.1 kg)    Telemetry    NSVT - Personally Reviewed  ECG    V paced rhythm - Personally Reviewed  Physical Exam   GEN:   Frail appearing  Neck: Positive JVD Cardiac:   RRR, 2/6 apical systolic murmur, no diastolic murmurs, rubs, or gallops.  Respiratory:    Bilateral basilar crackles GI: Soft, nontender, non-distended, normal bowel sounds  MS:  No edema; No deformity. Neuro:   Nonfocal  Psych: Oriented and appropriate    Labs      Chemistry Recent Labs  Lab 05/27/17 0617 05/28/17 0445 05/29/17 0652 05/30/17 0457  NA 145 140 137 137  K 3.6 3.9 4.1 4.0  CL 108 105 101 104  CO2 _0 GLUCOSE 95 110* 119* 94  BUN 31* 31* 28* 24*  CREATININE 1.50* 1.37* 1.34* 1.22  CALCIUM 8.4* 8.2* 8.4* 8.0*  PROT 5.8* 5.4* 5.7*  --   ALBUMIN 2.8* 2.5* 2.5*  --   AST 32 21 29  --   ALT _1 --   ALKPHOS 56 54 60  --   BILITOT 0.9 0.9 0.9  --   GFRNONAA 42* 46* 48* 53*  GFRAA 48* 54* 55* >60  ANIONGAP _2 Hematology Recent Labs  Lab 05/28/17 0445 05/29/17 0652 05/30/17 0457  WBC 11.8* 11.4* 10.0  RBC 4.60 4.53 4.25  HGB 12.7* 12.5* 11.8*  HCT 40.4 40.0 37.0*  MCV 87.8 88.3 87.1  MCH 27.6 27.6 27.8  MCHC 31.4 31.3 31.9  RDW 14.5 14.8 14.5  PLT 320 361 305    Cardiac Enzymes Recent Labs  Lab 05/26/17  1226 05/26/17 1812 05/26/17 2258 05/27/17 0617  TROPONINI 0.11* 0.13* 0.10* 0.10*   No results for input(s): TROPIPOC in the last 168 hours.   BNP Recent Labs  Lab 05/26/17 1226  BNP >4,500.0*     DDimer No results for input(s): DDIMER in the last 168 hours.   Lab Results  Component Value Date   TSH <0.010 (L) 05/26/2017      Radiology    Nm Gastric Emptying  Result Date: 05/30/2017 CLINICAL DATA:  Severe nausea and dry heaves since the summer of 2019. EXAM: NUCLEAR MEDICINE GASTRIC EMPTYING SCAN TECHNIQUE: After oral ingestion of radiolabeled meal, sequential abdominal images were obtained for 4 hours. Percentage of activity emptying the stomach was calculated at 1 hour, 2 hour, 3 hour, and 4 hours. RADIOPHARMACEUTICALS:  1.8 mCi Tc-40msulfur colloid in standardized meal COMPARISON:  None. FINDINGS: Expected location of the stomach in the left upper quadrant. Ingested meal empties the stomach gradually over the course of the study. 38.7 emptied at 1 hr ( normal >= 10%) 78.6 emptied at 2 hr ( normal >= 40%) 88.7 emptied at 3 hr ( normal >= 70%) 93.2 emptied at 4 hr (  normal >= 90%) IMPRESSION: Normal gastric emptying study. Electronically Signed   By: TInge RiseM.D.   On: 05/30/2017 14:22    Cardiac Studies   Echo 05/27/17: Study Conclusions - Left ventricle: The cavity size was normal. Wall thickness was   increased in a pattern of mild LVH. Systolic function was   severely reduced. The estimated ejection fraction was in the   range of 20% to 25%. There is akinesis of the inferolateral,   inferior, and inferoseptal myocardium. Features are consistent   with a pseudonormal left ventricular filling pattern, with   concomitant abnormal relaxation and increased filling pressure   (grade 2 diastolic dysfunction). - Aortic valve: A pericardial tissue valve bioprosthesis was   present and functioning normally. Peak velocity (S): 226 cm/s.   Mean gradient (S): 10 mm Hg. Valve area (VTI): 1.52 cm^2. Valve   area (Vmax): 1.5 cm^2. Valve area (Vmean): 1.59 cm^2. - Mitral valve: Calcified annulus. Mildly thickened leaflets .   There was mild regurgitation. - Left atrium: The atrium was severely dilated. - Right ventricle: Pacer wire or catheter noted in right ventricle. - Pulmonary arteries: Systolic pressure was mildly increased. PA   peak pressure: 35 mm Hg (S).  Impressions: - Compared to the prior study, there has been no significant interval change.  Patient Profile     84y.o. male with multiple medica problems.  History of AVR/CABG,  CM with CRTD.  Recent hyperthyroidism on amiodarone.  We were called to see for acute on chronic CHF.   Assessment & Plan    VENTRICULAR TACHYCARDIA:  No further sustained episodes since Mexiletine.  AVR:  Normal function with AVR.     Acute on chronic systolic heart failure:  Symptoms could all be related to his thyroid. However, with worsening Vtach and constellation of symptoms this could be low output failure.  I have reviewed the pleural fluid labs.  I don't see serum LDH or pleural fluid protein.   However, pleural fluid LDH would not suggest exudate and this is likely transudate.  Would suggest repeat CXR before discharge.  To further try to understand whether his symptoms and effusions are related to his thyroid or low output failure I have suggested a right and left heart cath.  Discussed with Dr. BHaroldine Lawswho agrees  to do this tomorrow.     PAF:  On Eliquis.  Hold for right and left heart cath.    CRT - D:  He had interrogation yesterday.  NL function.  meds restarted as above.    For questions or updates, please contact [place] Please consult [email or web address] for contact info under Cardiology/STEMI.      Signed, Minus Breeding, MD  05/30/2017, 2:47 PM

## 2017-05-30 NOTE — Progress Notes (Signed)
Nutrition Follow-up  DOCUMENTATION CODES:   Severe malnutrition in context of chronic illness  INTERVENTION:   -D/c Boost Plus, due to poor acceptance -Ensure Enlive po BID, each supplement provides 350 kcal and 20 grams of protein  NUTRITION DIAGNOSIS:   Severe Malnutrition related to chronic illness(CHF, COPD) as evidenced by energy intake < 75% for > or equal to 1 month, moderate fat depletion, severe fat depletion, moderate muscle depletion, severe muscle depletion, percent weight loss.  Ongoing  GOAL:   Patient will meet greater than or equal to 90% of their needs  Progressing  MONITOR:   PO intake, Supplement acceptance, Labs, Weight trends, Skin, I & O's  REASON FOR ASSESSMENT:   Malnutrition Screening Tool    ASSESSMENT:   [name] is an 84 y.o. male with past medical history significant for cardiomyopathy with an EF of 25% with ICD in place, atrial fibrillation, COPD, status post AVR, postural hypotension, DM who was discharged 15 days ago when he presented for for increasing weakness, weight loss and shortness of breath.  2/18- s/p thoracentesis (2 L removed)  Pt out of room at time of visit (currently undergoing gastric emptying study).   Meal completion has improved (PO: 85-100%). Noted pt has been refusing Boost Plus supplements; will trial Ensure Enlive.   Labs reviewed: CBGS: 116.   Diet Order:  DIET SOFT Room service appropriate? Yes; Fluid consistency: Thin  EDUCATION NEEDS:   Education needs have been addressed  Skin:  Skin Assessment: Reviewed RN Assessment  Last BM:  05/29/17  Height:   Ht Readings from Last 1 Encounters:  05/26/17 6' (1.829 m)    Weight:   Wt Readings from Last 1 Encounters:  05/30/17 187 lb 11.2 oz (85.1 kg)    Ideal Body Weight:  80.9 kg  BMI:  Body mass index is 25.46 kg/m.  Estimated Nutritional Needs:   Kcal:  2000-2200  Protein:  105-120 grams  Fluid:  2.0-2.2 L    [name],  RD, LDN, CDE Pager: [phone] After hours Pager: [phone]

## 2017-05-30 NOTE — Care Management Important Message (Signed)
Important Message  Patient Details  Name: MOXON MESSLER MRN: [id number] Date of Birth: Feb 22, 1933   Medicare Important Message Given:  Other (see comment)  Patient in a procedure a signed copy left in the patient room. Sahaj Bona 05/30/2017, 12:13 PM

## 2017-05-30 NOTE — Progress Notes (Signed)
PT Cancellation Note  Patient Details Name: CHAZE HRUSKA MRN: [id number] DOB: 1932/12/19   Cancelled Treatment:    Reason Eval/Treat Not Completed: Patient at procedure or test/unavailable. Leaving floor for gastric emptying. Will follow-up as schedule permits.  Mabeline Caras, PT, DPT Acute Rehab Services  Pager: [place] 05/30/2017, 10:36 AM

## 2017-05-30 NOTE — Progress Notes (Signed)
Telemetry reviewed, no further VT since yesterday morning, case reviewed with Dr. Curt Bears, agrees with plan, continue Mexiletine 231m PO BID.  Could down-titrate if clear worsening of GI complaints once resumed.  EP follow up has been arranged.  EP service remains available, please recall if needed.  RTommye Standard PA-C

## 2017-05-30 NOTE — Progress Notes (Signed)
Ok for the patient to go down for gastric emptying procedure per Cardiology.

## 2017-05-31 ENCOUNTER — Inpatient Hospital Stay (HOSPITAL_COMMUNITY)
Admission: EM | Disposition: A | Discharge status: Home Health | Payer: Self-pay | Source: Home / Self Care | Attending: Internal Medicine

## 2017-05-31 ENCOUNTER — Encounter (HOSPITAL_COMMUNITY): Payer: Self-pay | Admitting: Internal Medicine

## 2017-05-31 ENCOUNTER — Inpatient Hospital Stay (HOSPITAL_COMMUNITY): Payer: Medicare HMO

## 2017-05-31 DIAGNOSIS — I2510 Atherosclerotic heart disease of native coronary artery without angina pectoris: Secondary | ICD-10-CM

## 2017-05-31 DIAGNOSIS — I5023 Acute on chronic systolic (congestive) heart failure: Secondary | ICD-10-CM

## 2017-05-31 HISTORY — PX: RIGHT HEART CATH AND CORONARY/GRAFT ANGIOGRAPHY: CATH118265

## 2017-05-31 LAB — BASIC METABOLIC PANEL
Anion gap: 12 (ref 5–15)
BUN: 19 mg/dL (ref 6–20)
CALCIUM: 8.4 mg/dL — AB (ref 8.9–10.3)
CO2: 23 mmol/L (ref 22–32)
CREATININE: 1.26 mg/dL — AB (ref 0.61–1.24)
Chloride: 103 mmol/L (ref 101–111)
GFR, EST AFRICAN AMERICAN: 60 mL/min — AB (ref >60)
GFR, EST NON AFRICAN AMERICAN: 51 mL/min — AB (ref >60)
Glucose, Bld: 106 mg/dL — ABNORMAL HIGH (ref 65–99)
Potassium: 4.1 mmol/L (ref 3.5–5.1)
SODIUM: 138 mmol/L (ref 135–145)

## 2017-05-31 LAB — POCT I-STAT 3, ART BLOOD GAS (G3+)
ACID-BASE DEFICIT: 1 mmol/L (ref 0.0–2.0)
ACID-BASE DEFICIT: 3 mmol/L — AB (ref 0.0–2.0)
BICARBONATE: 20.6 mmol/L (ref 20.0–28.0)
Bicarbonate: 21.8 mmol/L (ref 20.0–28.0)
O2 SAT: 98 %
O2 Saturation: 98 %
PH ART: 7.458 — AB (ref 7.350–7.450)
TCO2: 21 mmol/L — AB (ref 22–32)
TCO2: 23 mmol/L (ref 22–32)
pCO2 arterial: 30.2 mmHg — ABNORMAL LOW (ref 32.0–48.0)
pCO2 arterial: 30.8 mmHg — ABNORMAL LOW (ref 32.0–48.0)
pH, Arterial: 7.441 (ref 7.350–7.450)
pO2, Arterial: 100 mmHg (ref 83.0–108.0)
pO2, Arterial: 93 mmHg (ref 83.0–108.0)

## 2017-05-31 LAB — POCT I-STAT 3, VENOUS BLOOD GAS (G3P V)
ACID-BASE DEFICIT: 1 mmol/L (ref 0.0–2.0)
ACID-BASE DEFICIT: 1 mmol/L (ref 0.0–2.0)
BICARBONATE: 23.5 mmol/L (ref 20.0–28.0)
Bicarbonate: 22.7 mmol/L (ref 20.0–28.0)
O2 SAT: 60 %
O2 SAT: 63 %
TCO2: 24 mmol/L (ref 22–32)
TCO2: 25 mmol/L (ref 22–32)
pCO2, Ven: 35.5 mmHg — ABNORMAL LOW (ref 44.0–60.0)
pCO2, Ven: 36.1 mmHg — ABNORMAL LOW (ref 44.0–60.0)
pH, Ven: 7.415 (ref 7.250–7.430)
pH, Ven: 7.421 (ref 7.250–7.430)
pO2, Ven: 31 mmHg — CL (ref 32.0–45.0)
pO2, Ven: 32 mmHg (ref 32.0–45.0)

## 2017-05-31 LAB — CBC
HCT: 38.7 % — ABNORMAL LOW (ref 39.0–52.0)
HEMOGLOBIN: 12.4 g/dL — AB (ref 13.0–17.0)
MCH: 27.9 pg (ref 26.0–34.0)
MCHC: 32 g/dL (ref 30.0–36.0)
MCV: 87 fL (ref 78.0–100.0)
PLATELETS: 343 10*3/uL (ref 150–400)
RBC: 4.45 MIL/uL (ref 4.22–5.81)
RDW: 14.5 % (ref 11.5–15.5)
WBC: 9.9 10*3/uL (ref 4.0–10.5)

## 2017-05-31 LAB — GLUCOSE, CAPILLARY
GLUCOSE-CAPILLARY: 151 mg/dL — AB (ref 65–99)
GLUCOSE-CAPILLARY: 119 mg/dL — AB (ref 65–99)
Glucose-Capillary: 123 mg/dL — ABNORMAL HIGH (ref 65–99)
Glucose-Capillary: 101 mg/dL — ABNORMAL HIGH (ref 65–99)

## 2017-05-31 SURGERY — RIGHT HEART CATH AND CORONARY/GRAFT ANGIOGRAPHY
Anesthesia: LOCAL

## 2017-05-31 MED ORDER — HEPARIN SODIUM (PORCINE) 1000 UNIT/ML IJ SOLN
INTRAMUSCULAR | Status: DC | PRN
  Administered 2017-05-31: 4000 [IU] via INTRAVENOUS

## 2017-05-31 MED ORDER — FUROSEMIDE 10 MG/ML IJ SOLN
20.0000 mg | Freq: Two times a day (BID) | INTRAMUSCULAR | Status: DC
  Administered 2017-05-31 – 2017-06-01 (×2): 20 mg via INTRAVENOUS
  Filled 2017-05-31 (×2): qty 2

## 2017-05-31 MED ORDER — SODIUM CHLORIDE 0.9% FLUSH
3.0000 mL | Freq: Two times a day (BID) | INTRAVENOUS | Status: DC
  Administered 2017-05-31 – 2017-06-04 (×8): 3 mL via INTRAVENOUS

## 2017-05-31 MED ORDER — BUPIVACAINE HCL (PF) 0.25 % IJ SOLN
INTRAMUSCULAR | Status: AC
  Filled 2017-05-31: qty 30

## 2017-05-31 MED ORDER — APIXABAN 2.5 MG PO TABS
2.5000 mg | ORAL_TABLET | Freq: Two times a day (BID) | ORAL | Status: DC
  Administered 2017-05-31 – 2017-06-04 (×9): 2.5 mg via ORAL
  Filled 2017-05-31 (×9): qty 1

## 2017-05-31 MED ORDER — ONDANSETRON HCL 4 MG/2ML IJ SOLN: 4.0000 mg | Freq: Four times a day (QID) | INTRAMUSCULAR | Status: DC | PRN

## 2017-05-31 MED ORDER — VERAPAMIL HCL 2.5 MG/ML IV SOLN
INTRAVENOUS | Status: DC | PRN
  Administered 2017-05-31: 10 mL via INTRA_ARTERIAL

## 2017-05-31 MED ORDER — SODIUM CHLORIDE 0.9 % IV SOLN: INTRAVENOUS | Status: AC

## 2017-05-31 MED ORDER — HEPARIN SODIUM (PORCINE) 1000 UNIT/ML IJ SOLN
INTRAMUSCULAR | Status: AC
  Filled 2017-05-31: qty 1

## 2017-05-31 MED ORDER — HEPARIN (PORCINE) IN NACL 2-0.9 UNIT/ML-% IJ SOLN
INTRAMUSCULAR | Status: AC
  Filled 2017-05-31: qty 1000

## 2017-05-31 MED ORDER — IOPAMIDOL (ISOVUE-370) INJECTION 76%
INTRAVENOUS | Status: DC | PRN
  Administered 2017-05-31: 60 mL via INTRA_ARTERIAL

## 2017-05-31 MED ORDER — SODIUM CHLORIDE 0.9 % IV SOLN: 250.0000 mL | INTRAVENOUS | Status: DC | PRN

## 2017-05-31 MED ORDER — LIDOCAINE HCL 1 % IJ SOLN
INTRAMUSCULAR | Status: AC
  Filled 2017-05-31: qty 20

## 2017-05-31 MED ORDER — HEPARIN (PORCINE) IN NACL 2-0.9 UNIT/ML-% IJ SOLN
INTRAMUSCULAR | Status: AC | PRN
  Administered 2017-05-31 (×2): 500 mL

## 2017-05-31 MED ORDER — VERAPAMIL HCL 2.5 MG/ML IV SOLN
INTRAVENOUS | Status: AC
  Filled 2017-05-31: qty 2

## 2017-05-31 MED ORDER — ACETAMINOPHEN 325 MG PO TABS: 650.0000 mg | ORAL_TABLET | ORAL | Status: DC | PRN

## 2017-05-31 MED ORDER — SODIUM CHLORIDE 0.9% FLUSH: 3.0000 mL | INTRAVENOUS | Status: DC | PRN

## 2017-05-31 MED ORDER — IOPAMIDOL (ISOVUE-370) INJECTION 76%
INTRAVENOUS | Status: AC
  Filled 2017-05-31: qty 150

## 2017-05-31 MED ORDER — BUPIVACAINE HCL (PF) 0.25 % IJ SOLN
INTRAMUSCULAR | Status: DC | PRN
  Administered 2017-05-31 (×2): 2 mL

## 2017-05-31 SURGICAL SUPPLY — 11 items
CATH BALLN WEDGE 5F 110CM (CATHETERS) ×1 IMPLANT
CATH INFINITI 5FR MULTPACK ANG (CATHETERS) ×1 IMPLANT
DEVICE RAD COMP TR BAND LRG (VASCULAR PRODUCTS) ×1 IMPLANT
GLIDESHEATH SLEND SS 6F .021 (SHEATH) ×1 IMPLANT
GUIDEWIRE INQWIRE 1.5J.035X260 (WIRE) IMPLANT
INQWIRE 1.5J .035X260CM (WIRE) ×2
KIT HEART LEFT (KITS) ×2 IMPLANT
PACK CARDIAC CATHETERIZATION (CUSTOM PROCEDURE TRAY) ×2 IMPLANT
SHEATH GLIDE SLENDER 4/5FR (SHEATH) ×1 IMPLANT
TRANSDUCER W/STOPCOCK (MISCELLANEOUS) ×2 IMPLANT
TUBING CIL FLEX 10 FLL-RA (TUBING) ×2 IMPLANT

## 2017-05-31 NOTE — Progress Notes (Signed)
Patient had 13bts vtach, asymptomatic.Cards NP made aware, no new orders.

## 2017-05-31 NOTE — Progress Notes (Signed)
Progress Note  Patient Name: [name] Date of Encounter: 05/31/2017  Primary Cardiologist: [name], MD   Subjective   He is weak and SOB.    Inpatient Medications    Scheduled Meds: . apixaban  2.5 mg Oral BID  . carvedilol  6.25 mg Oral BID WC  . famotidine  20 mg Oral QHS  . feeding supplement (ENSURE ENLIVE)  237 mL Oral BID BM  . hydrALAZINE  12.5 mg Oral TID  . isosorbide mononitrate  30 mg Oral QHS  . methimazole  10 mg Oral BID  . metoprolol tartrate  2.5 mg Intravenous Once  . mexiletine  250 mg Oral Q12H  . pantoprazole  40 mg Oral QAC breakfast  . sodium chloride flush  3 mL Intravenous Q12H   Continuous Infusions: . sodium chloride 50 mL/hr (05/31/17 0854)  . sodium chloride     PRN Meds: sodium chloride, acetaminophen, acetaminophen, benzonatate, ipratropium-albuterol, ondansetron **OR** ondansetron (ZOFRAN) IV, ondansetron (ZOFRAN) IV, oxyCODONE, promethazine, sodium chloride flush, traMADol   Vital Signs    Vitals:   05/31/17 0925 05/31/17 1028 05/31/17 1042 05/31/17 1101  BP: (!) 143/60 126/63 (!) 111/41 (!) 105/35  Pulse: 73 71 66 65  Resp:      Temp:      TempSrc:      SpO2:      Weight:      Height:        Intake/Output Summary (Last 24 hours) at 05/31/2017 1116 Last data filed at 05/31/2017 0653 Gross per 24 hour  Intake 0 ml  Output 300 ml  Net -300 ml   Filed Weights   05/29/17 0549 05/30/17 0543 05/31/17 0500  Weight: 188 lb 4.8 oz (85.4 kg) 187 lb 11.2 oz (85.1 kg) 188 lb 4.8 oz (85.4 kg)    Telemetry    NSVT - Personally Reviewed  ECG    NA - Personally Reviewed  Physical Exam   GEN:   Frail Neck: No JVD Cardiac: RRR, no murmurs, rubs, or gallops.  Respiratory:    Decreased breath sounds at the bases.  GI: Soft, nontender, non-distended, normal bowel sounds  MS:  Mild edema; No deformity. Neuro:   Nonfocal  Psych: Oriented and appropriate    Labs    Chemistry Recent Labs  Lab 05/27/17 0617  05/28/17 0445 05/29/17 0652 05/30/17 0457 05/31/17 0316  NA 145 140 137 137 138  K 3.6 3.9 4.1 4.0 4.1  CL 108 105 101 104 103  CO2 _0 GLUCOSE 95 110* 119* 94 106*  BUN 31* 31* 28* 24* 19  CREATININE 1.50* 1.37* 1.34* 1.22 1.26*  CALCIUM 8.4* 8.2* 8.4* 8.0* 8.4*  PROT 5.8* 5.4* 5.7*  --   --   ALBUMIN 2.8* 2.5* 2.5*  --   --   AST 32 21 29  --   --   ALT _1 --   --   ALKPHOS 56 54 60  --   --   BILITOT 0.9 0.9 0.9  --   --   GFRNONAA 42* 46* 48* 53* 51*  GFRAA 48* 54* 55* >60 60*  ANIONGAP _2 Hematology Recent Labs  Lab 05/29/17 0652 05/30/17 0457 05/31/17 0316  WBC 11.4* 10.0 9.9  RBC 4.53 4.25 4.45  HGB 12.5* 11.8* 12.4*  HCT 40.0 37.0* 38.7*  MCV 88.3 87.1 87.0  MCH 27.6 27.8 27.9  MCHC 31.3 31.9  32.0  RDW 14.8 14.5 14.5  PLT 361 305 343    Cardiac Enzymes Recent Labs  Lab 05/26/17 1226 05/26/17 1812 05/26/17 2258 05/27/17 0617  TROPONINI 0.11* 0.13* 0.10* 0.10*   No results for input(s): TROPIPOC in the last 168 hours.   BNP Recent Labs  Lab 05/26/17 1226  BNP >4,500.0*     DDimer No results for input(s): DDIMER in the last 168 hours.   Lab Results  Component Value Date   TSH <0.010 (L) 05/26/2017      Radiology    Nm Gastric Emptying  Result Date: 05/30/2017 CLINICAL DATA:  Severe nausea and dry heaves since the summer of 2019. EXAM: NUCLEAR MEDICINE GASTRIC EMPTYING SCAN TECHNIQUE: After oral ingestion of radiolabeled meal, sequential abdominal images were obtained for 4 hours. Percentage of activity emptying the stomach was calculated at 1 hour, 2 hour, 3 hour, and 4 hours. RADIOPHARMACEUTICALS:  1.8 mCi Tc-78msulfur colloid in standardized meal COMPARISON:  None. FINDINGS: Expected location of the stomach in the left upper quadrant. Ingested meal empties the stomach gradually over the course of the study. 38.7 emptied at 1 hr ( normal >= 10%) 78.6 emptied at 2 hr ( normal >= 40%) 88.7 emptied at 3 hr (  normal >= 70%) 93.2 emptied at 4 hr ( normal >= 90%) IMPRESSION: Normal gastric emptying study. Electronically Signed   By: TInge RiseM.D.   On: 05/30/2017 14:22    Cardiac Studies   Echo 05/27/17: Study Conclusions - Left ventricle: The cavity size was normal. Wall thickness was   increased in a pattern of mild LVH. Systolic function was   severely reduced. The estimated ejection fraction was in the   range of 20% to 25%. There is akinesis of the inferolateral,   inferior, and inferoseptal myocardium. Features are consistent   with a pseudonormal left ventricular filling pattern, with   concomitant abnormal relaxation and increased filling pressure   (grade 2 diastolic dysfunction). - Aortic valve: A pericardial tissue valve bioprosthesis was   present and functioning normally. Peak velocity (S): 226 cm/s.   Mean gradient (S): 10 mm Hg. Valve area (VTI): 1.52 cm^2. Valve   area (Vmax): 1.5 cm^2. Valve area (Vmean): 1.59 cm^2. - Mitral valve: Calcified annulus. Mildly thickened leaflets .   There was mild regurgitation. - Left atrium: The atrium was severely dilated. - Right ventricle: Pacer wire or catheter noted in right ventricle. - Pulmonary arteries: Systolic pressure was mildly increased. PA   peak pressure: 35 mm Hg (S).  Impressions: - Compared to the prior study, there has been no significant interval change.  Cath   05/31/17    Prox LAD to Mid LAD lesion is 50% stenosed.  Mid LAD to Dist LAD lesion is 25% stenosed.  1st Mrg lesion is 50% stenosed.  Prox RCA to Dist RCA lesion is 100% stenosed.  And is large.  Prox Graft to Mid Graft lesion is 30% stenosed.  And is large.  The flow in the graft is reversed.  There is competitive flow.  And is large.  There is competitive flow.  Ost 1st Diag to 1st Diag lesion is 90% stenosed.   Findings:  Ao = 145/67 (99)  RA = 6 RV = 70/9 PA = 69/30 (46) PCW = 26 v = 37 Fick cardiac output/index  =4.7/2.3 PVR = 4.3 FA sat = 98% PA sat = 63%, 60%     Patient Profile     84y.o. male  with multiple medica problems.  History of AVR/CABG,  CM with CRTD.  Recent hyperthyroidism on amiodarone.  We were called to see for acute on chronic CHF.   Assessment & Plan    VENTRICULAR TACHYCARDIA:  No further sustained episodes since Mexiletine.  Frequent runs of NSVT.  Continue current therapy.    AVR:  Normal function with AVR on echo as above.   Acute on chronic systolic heart failure:  Elevated wedge.  Need to restart IV diuresis today.  Watch creat closely.  Repeat PA/Lat CXR as his breath sounds are more diminished .  Need to understand if effusion is reaccumulating.   (Reassess IV Lasix dose in the AM.)   I reviewed the cath results with Dr. Haroldine Laws.    PAF:  On Eliquis.   Resumed  CRT - D:  He had interrogation this admit.  NL function.  Meds restarted as above.   HYPERTHYROID:     For questions or updates, please contact [place] Please consult [email or web address] for contact info under Cardiology/STEMI.      Signed, Minus Breeding, MD  05/31/2017, 11:16 AM

## 2017-05-31 NOTE — Progress Notes (Signed)
Received patient post heart cath, right brachial site with coban dressing CDI, right radial TR band with 13cc air per report, level 0.Patient alert and oriented ,no complaints of any pain discomfort.will monitor accordingly.

## 2017-05-31 NOTE — Interval H&P Note (Signed)
History and Physical Interval Note:  05/31/2017 7:47 AM  [name]  has presented today for surgery, with the diagnosis of HF  The various methods of treatment have been discussed with the patient and family. After consideration of risks, benefits and other options for treatment, the patient has consented to  Procedure(s): RIGHT/LEFT HEART CATH AND CORONARY/GRAFT ANGIOGRAPHY (N/A) and possible coronary angioplasty as a surgical intervention .  The patient's history has been reviewed, patient examined, no change in status, stable for surgery.  I have reviewed the patient's chart and labs.  Questions were answered to the patient's satisfaction.     Daniel Bensimhon

## 2017-05-31 NOTE — Progress Notes (Signed)
Physical Therapy Treatment Patient Details Name: [name] MRN: [id number] DOB: 1932/07/25 Today's Date: 05/31/2017    History of Present Illness Pt is an 84 y.o. male admitted 05/26/17 with c/o weakness, fatigue, and abdominal discomfort; worked up for CHF exacerbation. CXR showed pleural effusion. S/p thoracentesis 2/18. PMH includes AICD placement, CABG (2009), AVR, HTN, DM, CKD, arthritis. Of note, admitted 2 weeks ago with c/o weakness; was d/c home with HHPT services.    PT Comments    No c/o pain, but fatigued from procedure this AM.  Session focus on activity tolerance and safety during functional mobility and ADLs.  Pt requiring min assist for ambulation and up to mod assist for transfers due to decreased pushing through LUE follow cath.  Discussed post-acute rehab at d/c, education to continue.     Follow Up Recommendations  SNF     Equipment Recommendations  None recommended by PT    Recommendations for Other Services       Precautions / Restrictions Precautions Precautions: Fall;Other (comment)(no weightbearing through L wrist following heart cath)    Mobility  Bed Mobility Overal bed mobility: Needs Assistance Bed Mobility: Supine to Sit     Supine to sit: Supervision;HOB elevated        Transfers Overall transfer level: Needs assistance Equipment used: 4-wheeled walker Transfers: Sit to/from Stand Sit to Stand: Mod assist         General transfer comment: increased assist when only able to boost with RUE  Ambulation/Gait Ambulation/Gait assistance: Min guard Ambulation Distance (Feet): 20 Feet Assistive device: 4-wheeled walker Gait Pattern/deviations: Step-through pattern         Stairs         General stair comments: ambulation within room to/from bathroom.  Pt steady with rollator, min guard for speed 2/2 urgency  Wheelchair Mobility    Modified Rankin (Stroke Patients Only)       Balance                                            Cognition Arousal/Alertness: Awake/alert Behavior During Therapy: WFL for tasks assessed/performed Overall Cognitive Status: Within Functional Limits for tasks assessed                                        Exercises      General Comments        Pertinent Vitals/Pain Pain Assessment: No/denies pain    Home Living                      Prior Function            PT Goals (current goals can now be found in the care plan section) Acute Rehab PT Goals PT Goal Formulation: With patient/family Time For Goal Achievement: 06/11/17 Potential to Achieve Goals: Good Progress towards PT goals: Progressing toward goals    Frequency    Min 2X/week      PT Plan Current plan remains appropriate    Co-evaluation              AM-PAC PT "6 Clicks" Daily Activity  Outcome Measure  Difficulty turning over in bed (including adjusting bedclothes, sheets and blankets)?: A Little Difficulty moving from lying on back to sitting on the side  of the bed? : A Little Difficulty sitting down on and standing up from a chair with arms (e.g., wheelchair, bedside commode, etc,.)?: A Little Help needed moving to and from a bed to chair (including a wheelchair)?: A Little Help needed walking in hospital room?: A Little Help needed climbing 3-5 steps with a railing? : A Lot 6 Click Score: 17    End of Session   Activity Tolerance: Patient tolerated treatment well Patient left: in chair;with call bell/phone within reach Nurse Communication: Mobility status PT Visit Diagnosis: Muscle weakness (generalized) (M62.81);Other symptoms and signs involving the nervous system (R29.898)     Time: 8520-7409 PT Time Calculation (min) (ACUTE ONLY): 24 min  Charges:  $Therapeutic Activity: 23-37 mins                    G Codes:          Michel Santee 05/31/2017, 4:26 PM

## 2017-05-31 NOTE — Progress Notes (Addendum)
PROGRESS NOTE    [name]  MNO:[id number] DOB: [birth year] DOA: 05/26/2017 PCP: [name], MD   Chief Complaint  Patient presents with  . Shortness of Breath  . Weakness    Brief Narrative:  HPI On 05/26/2017 by Dr. Bonnell Public [name] is an 84 y.o. male with past medical history significant for cardiomyopathy with an EF of 25% with ICD in place, atrial fibrillation, COPD, status post AVR, postural hypotension, DM who was discharged 15 days ago when he presented for for increasing weakness, weight loss and shortness of breath. Workup revealed hyperthyroidism which was thought to be secondary to amiodarone. Patient was started on methimazole and discharged home for follow-up with endocrinology.  Patient now presents stating that he did well for about 2 days after discharge and "it started getting bad again". Patient's main complaints are profound difficulty with ambulation. He describes a feeling of "feeling sick in my stomach" every time he tries to walk. He is also extremely short of breath with ambulation. Patient notes these symptoms have been present pretty much without change for the past 2 weeks however he came in today because his children forced him to come because he was unable to get off the couch because he was so weak. Patient does admit to some orthopnea which is also been going on for the past couple of weeks. Patient notes he is unable to walk across even a couple of steps due to discomfort in his abdominal area and shortness of breath.  Patient also complains of difficulty eating although it's unclear to me whether this is dysphagia, anorexia or gastric parous cyst. He complains nausea and a discomfort and abdominal area almost all the time but again it's worse with any exertion. Patient denies any fevers or chills but does admit to sweats during the day which have been present for several months. These are been attributed to hyperthyroidism. He also notes  a 20 pound weight loss over the past couple of weeks which he also mentioned on last admission last month. Patient also notes he's had several episodes of paroxysmal coughing such that he felt that he was cannot vomit. These have been occurring over the past several weeks. These are entirely nonproductive without hemoptysis or any phlegm production.  Interim history  Patient admitted for right pleural effusion, had thoracentesis on 05/27/2017.  Cardiology consulted and appreciated.  Patient also has sustained V. tach this morning approximately 7 minutes.  Patient also with significant nausea, CT abdomen and pelvis obtained.  GI consulted. Pending gastric emptying study today. Assessment & Plan   Ventricular tachycardia -Rapid response was called the morning of 05/29/2017 for VT, patient was instructed to perform vagal maneuvers, his tachycardia eventually broke. -patient has remained asymptomatic -potassium 4.1, magnesium 2 (labs pending this morning) -cardiology possibly considering catheterization prior starting inotropic therapy (in PN on 2/19) -cardiology restarted Mexiletine 277m BID  Acute on chronic systolic/diastolic heart failure -Echocardiogram showed an EF of 203-83% grade 2 diastolic dysfunction -BNP on admission greater than 4500 -Cardiology consulted and appreciated -s/p heart catheterization showing three-vessel coronary disease with stable revascularization with patent LIMA-LAD, SVG-diagonal and SVG-RCA.  Moderate pH due to mixture of pulmonary venous and pulmonary atrial hypertension.  Normal cardiac output no evidence of thyrotoxicosis heart disease or pH.  Recommended to restart IV diuresis. -Monitor intake and output, daily weights -Continue coreg -Not on ACE inhibitor or ARB due to renal insufficiency -placed on IV lasix 221mBID  Elevated troponin -Troponin 0.13,  0.10, 0.10 -Cardiology consulted and appreciated -Echocardiogram as above  Right pleural effusion -Noted  on chest x-ray 05/26/2017 -Interventional radiology consulted and appreciated, Eliquis was held -Status post thoracentesis, 2 L drained -Fluid culture shows no growth, Gram stain showed no organisms -pending repeat CXR given diminished breath sounds and possible reaccumulation of effusion  Atrial fibrillation, paroxysmal -Continue Coreg -Currently rate controlled -Continue Eliquis  Abdominal discomfort -No longer complaining of abdominal pain, etiology unclear -CT abdomen pelvis showed moderate bilateral pleural effusions with adjacent subsegmental atelectasis.  Sigmoid diverticulosis without inflammation.  No acute abnormality seen in the abdomen or pelvis. -mexiletine can be associated with GI discomfort, initially held however restarted by cardiology for VT -?gastroparesis -GI consulted and appreciated, recommended gastric emptying study and possible trial of promethazine.  Patient may need erythromycin/Reglan however pending further recommendations from cardiology given these medications can prolong QT -gastric emptying study normal  Hyperthyroidism -TSH less than 0.010, T3 4.2, free T4 4.01 -Continue methimazole -Patient follows with endocrinology, Dr. Cruzita Lederer   Coughing -Suspect secondary to pleural effusion -Respiratory viral panel negative -continue antitussives  Dyspnea -Likely multifactorial including pleural effusion versus CHF versus atrial fibrillation -Patient also with history of COPD -Continue neb treatments as well as Lasix  Essential hypertension -Continue Coreg, hydralazine, Lasix (will place on IV)  Chronic kidney disease, stage III -Creatinine 1.26 today, appears stable -continue to monitor BMP  CAD -Status post CABG -Currently chest pain-free -continue coreg, imdur -Statin discontinued  GERD/history of Barrett's esophagus -Continue Protonix/pepcid  Generalized weakness and fatigue/failure to thrive -Patient has had weight loss and shortness of  breath for quite some time -Possibly secondary to the above items -Will continue to monitor and treat accordingly -PT recommended SNF, social work consulted  DVT Prophylaxis  Eliquis  Code Status: Full  Family Communication: none at bedside  Disposition Plan: Admitted. Likely SNF at discharge. Pending further recommendations from cardiology and gastroenterology.  Continue IV diuresis   Consultants Cardiology Gastroenterology  Procedures  Echocardiogram  Antibiotics   Anti-infectives (From admission, onward)   None      Subjective:   Cindee Salt seen and examined today.  Continues to feel weak and somewhat short of breath this morning.  Denies current chest pain, abdominal pain, nausea or vomiting, diarrhea or constipation, dizziness or headache.  Objective:   Vitals:   05/31/17 1028 05/31/17 1042 05/31/17 1101 05/31/17 1126  BP: 126/63 (!) 111/41 (!) 105/35 (!) 120/38  Pulse: 71 66 65 66  Resp:      Temp:      TempSrc:      SpO2:      Weight:      Height:        Intake/Output Summary (Last 24 hours) at 05/31/2017 1248 Last data filed at 05/31/2017 0653 Gross per 24 hour  Intake 0 ml  Output 300 ml  Net -300 ml   Filed Weights   05/29/17 0549 05/30/17 0543 05/31/17 0500  Weight: 85.4 kg (188 lb 4.8 oz) 85.1 kg (187 lb 11.2 oz) 85.4 kg (188 lb 4.8 oz)   Exam  General: Well developed, ill appearing, NAD  HEENT: NCAT, mucous membranes moist.   Neck: Supple  Cardiovascular: S1 S2 auscultated, regular  Respiratory: diminished breath sounds  Abdomen: Soft, nontender, nondistended, + bowel sounds  Extremities: warm dry without cyanosis clubbing. Trace LE edema.   Neuro: AAOx3, nonfocal  Psych: Normal affect and demeanor, pleasant   Data Reviewed: I have personally reviewed following labs and imaging studies  CBC: Recent Labs  Lab 05/26/17 1226 05/27/17 0617 05/28/17 0445 05/29/17 0652 05/30/17 0457 05/31/17 0316  WBC 9.8 9.1 11.8* 11.4*  10.0 9.9  NEUTROABS 6.3  --  7.2 6.6  --   --   HGB 12.8* 13.4 12.7* 12.5* 11.8* 12.4*  HCT 40.0 41.7 40.4 40.0 37.0* 38.7*  MCV 87.7 88.7 87.8 88.3 87.1 87.0  PLT 376 322 320 361 305 438   Basic Metabolic Panel: Recent Labs  Lab 05/27/17 0617 05/27/17 1846 05/28/17 0445 05/29/17 0652 05/30/17 0457 05/31/17 0316  NA 145  --  140 137 137 138  K 3.6  --  3.9 4.1 4.0 4.1  CL 108  --  105 101 104 103  CO2 23  --  _0 GLUCOSE 95  --  110* 119* 94 106*  BUN 31*  --  31* 28* 24* 19  CREATININE 1.50*  --  1.37* 1.34* 1.22 1.26*  CALCIUM 8.4*  --  8.2* 8.4* 8.0* 8.4*  MG  --  2.1 2.0 2.0 2.1  --   PHOS  --   --  3.3 2.8  --   --    GFR: Estimated Creatinine Clearance: 49.6 mL/min (A) (by C-G formula based on SCr of 1.26 mg/dL (H)). Liver Function Tests: Recent Labs  Lab 05/26/17 1226 05/27/17 0617 05/28/17 0445 05/29/17 0652  AST 23 32 21 29  ALT _1 ALKPHOS 61 56 54 60  BILITOT 0.9 0.9 0.9 0.9  PROT 5.8* 5.8* 5.4* 5.7*  ALBUMIN 2.9* 2.8* 2.5* 2.5*   No results for input(s): LIPASE, AMYLASE in the last 168 hours. No results for input(s): AMMONIA in the last 168 hours. Coagulation Profile: Recent Labs  Lab 05/26/17 1226  INR 1.65   Cardiac Enzymes: Recent Labs  Lab 05/26/17 1226 05/26/17 1812 05/26/17 2258 05/27/17 0617  TROPONINI 0.11* 0.13* 0.10* 0.10*   BNP (last 3 results) Recent Labs    04/18/17 1126 04/23/17 1048 05/06/17 1110  PROBNP 10,928* 10,848* 22,420*   HbA1C: No results for input(s): HGBA1C in the last 72 hours. CBG: Recent Labs  Lab 05/28/17 1640 05/29/17 0601 05/30/17 0628 05/31/17 0708 05/31/17 1146  GLUCAP 119* 95 116* 123* 151*   Lipid Profile: No results for input(s): CHOL, HDL, LDLCALC, TRIG, CHOLHDL, LDLDIRECT in the last 72 hours. Thyroid Function Tests: No results for input(s): TSH, T4TOTAL, FREET4, T3FREE, THYROIDAB in the last 72 hours. Anemia Panel: No results for input(s): VITAMINB12, FOLATE,  FERRITIN, TIBC, IRON, RETICCTPCT in the last 72 hours. Urine analysis:    Component Value Date/Time   COLORURINE YELLOW 05/08/2017 Marie 05/08/2017 1353   LABSPEC 1.033 (H) 05/08/2017 1353   PHURINE 5.0 05/08/2017 1353   GLUCOSEU NEGATIVE 05/08/2017 1353   HGBUR NEGATIVE 05/08/2017 1353   BILIRUBINUR NEGATIVE 05/08/2017 1353   KETONESUR NEGATIVE 05/08/2017 1353   PROTEINUR NEGATIVE 05/08/2017 1353   UROBILINOGEN 0.2 03/08/2008 1519   NITRITE NEGATIVE 05/08/2017 1353   LEUKOCYTESUR NEGATIVE 05/08/2017 1353   Sepsis Labs: _2 (procalcitonin:4,lacticidven:4)  ) Recent Results (from the past 240 hour(s))  MRSA PCR Screening     Status: None   Collection Time: 05/27/17  8:08 AM  Result Value Ref Range Status   MRSA by PCR NEGATIVE NEGATIVE Final    Comment:        The GeneXpert MRSA Assay (FDA approved for NASAL specimens only), is one component of a comprehensive MRSA colonization surveillance program. It is not intended  to diagnose MRSA infection nor to guide or monitor treatment for MRSA infections. Performed at [facility], [place] [address], [place], [place] [address]   Gram stain     Status: None   Collection Time: 05/27/17  2:25 PM  Result Value Ref Range Status   Specimen Description FLUID RIGHT PLEURAL  Final   Special Requests   Final    NONE Performed at [facility], [place] [address] [place], [place] [address]    Gram Stain   Final    WBC PRESENT, PREDOMINANTLY PMN NO ORGANISMS SEEN CYTOSPIN SMEAR    Report Status 05/28/2017 FINAL  Final  Culture, body fluid-bottle     Status: None (Preliminary result)   Collection Time: 05/27/17  2:25 PM  Result Value Ref Range Status   Specimen Description FLUID RIGHT PLEURAL  Final   Special Requests BOTTLES DRAWN AEROBIC AND ANAEROBIC  Final   Culture   Final    NO GROWTH 3 DAYS Performed at [facility], [place] [address] [place], [place] [address]    Report Status PENDING   Incomplete  Respiratory Panel by PCR     Status: None   Collection Time: 05/28/17 11:56 PM  Result Value Ref Range Status   Adenovirus NOT DETECTED NOT DETECTED Final   Coronavirus 229E NOT DETECTED NOT DETECTED Final   Coronavirus HKU1 NOT DETECTED NOT DETECTED Final   Coronavirus NL63 NOT DETECTED NOT DETECTED Final   Coronavirus OC43 NOT DETECTED NOT DETECTED Final   Metapneumovirus NOT DETECTED NOT DETECTED Final   Rhinovirus / Enterovirus NOT DETECTED NOT DETECTED Final   Influenza A NOT DETECTED NOT DETECTED Final   Influenza B NOT DETECTED NOT DETECTED Final   Parainfluenza Virus 1 NOT DETECTED NOT DETECTED Final   Parainfluenza Virus 2 NOT DETECTED NOT DETECTED Final   Parainfluenza Virus 3 NOT DETECTED NOT DETECTED Final   Parainfluenza Virus 4 NOT DETECTED NOT DETECTED Final   Respiratory Syncytial Virus NOT DETECTED NOT DETECTED Final   Bordetella pertussis NOT DETECTED NOT DETECTED Final   Chlamydophila pneumoniae NOT DETECTED NOT DETECTED Final   Mycoplasma pneumoniae NOT DETECTED NOT DETECTED Final    Comment: Performed at [facility], [place] [address] [place], [place] [address]      Radiology Studies: Nm Gastric Emptying  Result Date: 05/30/2017 CLINICAL DATA:  Severe nausea and dry heaves since the summer of 2019. EXAM: NUCLEAR MEDICINE GASTRIC EMPTYING SCAN TECHNIQUE: After oral ingestion of radiolabeled meal, sequential abdominal images were obtained for 4 hours. Percentage of activity emptying the stomach was calculated at 1 hour, 2 hour, 3 hour, and 4 hours. RADIOPHARMACEUTICALS:  1.8 mCi Tc-66msulfur colloid in standardized meal COMPARISON:  None. FINDINGS: Expected location of the stomach in the left upper quadrant. Ingested meal empties the stomach gradually over the course of the study. 38.7 emptied at 1 hr ( normal >= 10%) 78.6 emptied at 2 hr ( normal >= 40%) 88.7 emptied at 3 hr ( normal >= 70%) 93.2 emptied at 4 hr ( normal >= 90%) IMPRESSION: Normal  gastric emptying study. Electronically Signed   By: TInge RiseM.D.   On: 05/30/2017 14:22     Scheduled Meds: . apixaban  2.5 mg Oral BID  . carvedilol  6.25 mg Oral BID WC  . famotidine  20 mg Oral QHS  . feeding supplement (ENSURE ENLIVE)  237 mL Oral BID BM  . furosemide  20 mg Intravenous BID  .  hydrALAZINE  12.5 mg Oral TID  . isosorbide mononitrate  30 mg Oral QHS  . methimazole  10 mg Oral BID  . metoprolol tartrate  2.5 mg Intravenous Once  . mexiletine  250 mg Oral Q12H  . pantoprazole  40 mg Oral QAC breakfast  . sodium chloride flush  3 mL Intravenous Q12H   Continuous Infusions: . sodium chloride 50 mL/hr (05/31/17 0854)  . sodium chloride       LOS: 5 days   Time Spent in minutes  30 minutes  Nithya Meriweather D.O. on 05/31/2017 at 12:48 PM  Between 7am to 7pm - Pager - [phone]  After 7pm go to [email or web address] - password [email or web address]  And look for the night coverage person covering for me after hours  [facility] Office  [phone]

## 2017-06-01 DIAGNOSIS — I481 Persistent atrial fibrillation: Secondary | ICD-10-CM

## 2017-06-01 DIAGNOSIS — Z95810 Presence of automatic (implantable) cardiac defibrillator: Secondary | ICD-10-CM

## 2017-06-01 DIAGNOSIS — I25758 Atherosclerosis of native coronary artery of transplanted heart with other forms of angina pectoris: Secondary | ICD-10-CM

## 2017-06-01 DIAGNOSIS — I10 Essential (primary) hypertension: Secondary | ICD-10-CM

## 2017-06-01 DIAGNOSIS — Z952 Presence of prosthetic heart valve: Secondary | ICD-10-CM

## 2017-06-01 LAB — CULTURE, BODY FLUID W GRAM STAIN -BOTTLE: Culture: NO GROWTH

## 2017-06-01 LAB — GLUCOSE, CAPILLARY
GLUCOSE-CAPILLARY: 159 mg/dL — AB (ref 65–99)
GLUCOSE-CAPILLARY: 137 mg/dL — AB (ref 65–99)
GLUCOSE-CAPILLARY: 118 mg/dL — AB (ref 65–99)
Glucose-Capillary: 110 mg/dL — ABNORMAL HIGH (ref 65–99)

## 2017-06-01 LAB — BASIC METABOLIC PANEL
ANION GAP: 11 (ref 5–15)
BUN: 17 mg/dL (ref 6–20)
CHLORIDE: 103 mmol/L (ref 101–111)
CO2: 22 mmol/L (ref 22–32)
Calcium: 8.1 mg/dL — ABNORMAL LOW (ref 8.9–10.3)
Creatinine, Ser: 1.22 mg/dL (ref 0.61–1.24)
GFR calc Af Amer: >60 mL/min (ref >60)
GFR calc non Af Amer: 53 mL/min — ABNORMAL LOW (ref >60)
GLUCOSE: 103 mg/dL — AB (ref 65–99)
Potassium: 3.8 mmol/L (ref 3.5–5.1)
Sodium: 136 mmol/L (ref 135–145)

## 2017-06-01 LAB — CBC
HEMATOCRIT: 37.4 % — AB (ref 39.0–52.0)
HEMOGLOBIN: 11.8 g/dL — AB (ref 13.0–17.0)
MCH: 27.4 pg (ref 26.0–34.0)
MCHC: 31.6 g/dL (ref 30.0–36.0)
MCV: 86.8 fL (ref 78.0–100.0)
Platelets: 296 10*3/uL (ref 150–400)
RBC: 4.31 MIL/uL (ref 4.22–5.81)
RDW: 14.5 % (ref 11.5–15.5)
WBC: 9.8 10*3/uL (ref 4.0–10.5)

## 2017-06-01 LAB — CULTURE, BODY FLUID-BOTTLE

## 2017-06-01 LAB — MAGNESIUM: Magnesium: 2.1 mg/dL (ref 1.7–2.4)

## 2017-06-01 MED ORDER — FUROSEMIDE 10 MG/ML IJ SOLN
20.0000 mg | Freq: Once | INTRAMUSCULAR | Status: AC
  Administered 2017-06-01: 20 mg via INTRAVENOUS
  Filled 2017-06-01: qty 2

## 2017-06-01 MED ORDER — POTASSIUM CHLORIDE CRYS ER 20 MEQ PO TBCR
40.0000 meq | EXTENDED_RELEASE_TABLET | Freq: Once | ORAL | Status: AC
  Administered 2017-06-01: 40 meq via ORAL
  Filled 2017-06-01: qty 2

## 2017-06-01 MED ORDER — FUROSEMIDE 10 MG/ML IJ SOLN
40.0000 mg | Freq: Two times a day (BID) | INTRAMUSCULAR | Status: DC
  Administered 2017-06-01 – 2017-06-02 (×2): 40 mg via INTRAVENOUS
  Filled 2017-06-01 (×2): qty 4

## 2017-06-01 NOTE — Progress Notes (Signed)
Progress Note  Patient Name: [name] Date of Encounter: 06/01/2017  Primary Cardiologist: [name], MD   Subjective   Says breathing is about the same as yesterday. Does feel a bit stronger as he is now able to sit up. Denies chest pain and palpitations.  Inpatient Medications    Scheduled Meds: . apixaban  2.5 mg Oral BID  . carvedilol  6.25 mg Oral BID WC  . famotidine  20 mg Oral QHS  . feeding supplement (ENSURE ENLIVE)  237 mL Oral BID BM  . furosemide  20 mg Intravenous BID  . hydrALAZINE  12.5 mg Oral TID  . isosorbide mononitrate  30 mg Oral QHS  . methimazole  10 mg Oral BID  . metoprolol tartrate  2.5 mg Intravenous Once  . mexiletine  250 mg Oral Q12H  . pantoprazole  40 mg Oral QAC breakfast  . sodium chloride flush  3 mL Intravenous Q12H   Continuous Infusions: . sodium chloride     PRN Meds: sodium chloride, acetaminophen, acetaminophen, benzonatate, ipratropium-albuterol, ondansetron **OR** ondansetron (ZOFRAN) IV, ondansetron (ZOFRAN) IV, oxyCODONE, promethazine, sodium chloride flush, traMADol   Vital Signs    Vitals:   05/31/17 1529 05/31/17 1607 05/31/17 1934 06/01/17 0309  BP: (!) 147/61  (!) 142/62 (!) 152/86  Pulse: 64 66 64 76  Resp:   18 18  Temp:   (!) 97.5 F (36.4 C) 98 F (36.7 C)  TempSrc:   Oral Oral  SpO2:  94% 100% 100%  Weight:    186 lb 3.2 oz (84.5 kg)  Height:        Intake/Output Summary (Last 24 hours) at 06/01/2017 1013 Last data filed at 06/01/2017 0949 Gross per 24 hour  Intake 53 ml  Output 500 ml  Net -447 ml   Filed Weights   05/30/17 0543 05/31/17 0500 06/01/17 0309  Weight: 187 lb 11.2 oz (85.1 kg) 188 lb 4.8 oz (85.4 kg) 186 lb 3.2 oz (84.5 kg)    Telemetry    Ventricular pacing with 12-beat run of NSVT - Personally Reviewed  ECG    N/A - Personally Reviewed  Physical Exam   GEN: No acute distress. Elderly, frail, chronically ill appearing. Neck: No JVD Cardiac: RRR, no murmurs, rubs,  or gallops.  Respiratory: Diminished sounds particularly at left base about 1/4 up, no crackles or wheezes. GI: Soft, nontender, non-distended  MS: No edema; No deformity. Neuro:  Nonfocal  Psych: Normal affect   Labs    Chemistry Recent Labs  Lab 05/27/17 0617 05/28/17 0445 05/29/17 8335 05/30/17 0457 05/31/17 0316 06/01/17 0531  NA 145 140 137 137 138 136  K 3.6 3.9 4.1 4.0 4.1 3.8  CL 108 105 101 104 103 103  CO2 _0 GLUCOSE 95 110* 119* 94 106* 103*  BUN 31* 31* 28* 24* 19 17  CREATININE 1.50* 1.37* 1.34* 1.22 1.26* 1.22  CALCIUM 8.4* 8.2* 8.4* 8.0* 8.4* 8.1*  PROT 5.8* 5.4* 5.7*  --   --   --   ALBUMIN 2.8* 2.5* 2.5*  --   --   --   AST 32 21 29  --   --   --   ALT _1 --   --   --   ALKPHOS 56 54 60  --   --   --   BILITOT 0.9 0.9 0.9  --   --   --   GFRNONAA 42* 46*  48* 53* 51* 53*  GFRAA 48* 54* 55* >60 60* >60  ANIONGAP _0 Hematology Recent Labs  Lab 05/30/17 0457 05/31/17 0316 06/01/17 0531  WBC 10.0 9.9 9.8  RBC 4.25 4.45 4.31  HGB 11.8* 12.4* 11.8*  HCT 37.0* 38.7* 37.4*  MCV 87.1 87.0 86.8  MCH 27.8 27.9 27.4  MCHC 31.9 32.0 31.6  RDW 14.5 14.5 14.5  PLT 305 343 296    Cardiac Enzymes Recent Labs  Lab 05/26/17 1226 05/26/17 1812 05/26/17 2258 05/27/17 0617  TROPONINI 0.11* 0.13* 0.10* 0.10*   No results for input(s): TROPIPOC in the last 168 hours.   BNP Recent Labs  Lab 05/26/17 1226  BNP >4,500.0*     DDimer No results for input(s): DDIMER in the last 168 hours.   Radiology    Dg Chest 2 View  Result Date: 05/31/2017 CLINICAL DATA:  Shortness of breath. EXAM: CHEST  2 VIEW COMPARISON:  05/28/2017. FINDINGS: Cardiac pacer scratched it AICD noted in stable position. Prior CABG. Cardiomegaly. Bilateral basilar interstitial prominence and small bilateral pleural effusions consistent with CHF. Slight improvement from prior exam. IMPRESSION: AICD noted stable position. Cardiomegaly with  bilateral from interstitial prominence and bilateral pleural effusions consistent with CHF. Slight improvement from prior exam. Electronically Signed   By: Marcello Moores  Register   On: 05/31/2017 14:03   Nm Gastric Emptying  Result Date: 05/30/2017 CLINICAL DATA:  Severe nausea and dry heaves since the summer of 2019. EXAM: NUCLEAR MEDICINE GASTRIC EMPTYING SCAN TECHNIQUE: After oral ingestion of radiolabeled meal, sequential abdominal images were obtained for 4 hours. Percentage of activity emptying the stomach was calculated at 1 hour, 2 hour, 3 hour, and 4 hours. RADIOPHARMACEUTICALS:  1.8 mCi Tc-39msulfur colloid in standardized meal COMPARISON:  None. FINDINGS: Expected location of the stomach in the left upper quadrant. Ingested meal empties the stomach gradually over the course of the study. 38.7 emptied at 1 hr ( normal >= 10%) 78.6 emptied at 2 hr ( normal >= 40%) 88.7 emptied at 3 hr ( normal >= 70%) 93.2 emptied at 4 hr ( normal >= 90%) IMPRESSION: Normal gastric emptying study. Electronically Signed   By: TInge RiseM.D.   On: 05/30/2017 14:22    Cardiac Studies   Echo 05/27/17: Study Conclusions - Left ventricle: The cavity size was normal. Wall thickness was increased in a pattern of mild LVH. Systolic function was severely reduced. The estimated ejection fraction was in the range of 20% to 25%. There is akinesis of the inferolateral, inferior, and inferoseptal myocardium. Features are consistent with a pseudonormal left ventricular filling pattern, with concomitant abnormal relaxation and increased filling pressure (grade 2 diastolic dysfunction). - Aortic valve: A pericardial tissue valve bioprosthesis was present and functioning normally. Peak velocity (S): 226 cm/s. Mean gradient (S): 10 mm Hg. Valve area (VTI): 1.52 cm^2. Valve area (Vmax): 1.5 cm^2. Valve area (Vmean): 1.59 cm^2. - Mitral valve: Calcified annulus. Mildly thickened leaflets . There was  mild regurgitation. - Left atrium: The atrium was severely dilated. - Right ventricle: Pacer wire or catheter noted in right ventricle. - Pulmonary arteries: Systolic pressure was mildly increased. PA peak pressure: 35 mm Hg (S).  Impressions: - Compared to the prior study, there has been no significant interval change.  Cath   05/31/17    Prox LAD to Mid LAD lesion is 50% stenosed.  Mid LAD to Dist LAD lesion is 25% stenosed.  1st Mrg lesion  is 50% stenosed.  Prox RCA to Dist RCA lesion is 100% stenosed.  And is large.  Prox Graft to Mid Graft lesion is 30% stenosed.  And is large.  The flow in the graft is reversed.  There is competitive flow.  And is large.  There is competitive flow.  Ost 1st Diag to 1st Diag lesion is 90% stenosed.  Findings:  Ao = 145/67 (99)  RA = 6 RV = 70/9 PA = 69/30 (46) PCW = 26 v = 37 Fick cardiac output/index =4.7/2.3 PVR = 4.3 FA sat = 98% PA sat = 63%, 60%       Patient Profile     84 y.o. male with multiple medica problems. History of AVR/CABG, CM with CRTD. Recent hyperthyroidism on amiodarone. We were called to see for acute on chronic CHF and VT.  Assessment & Plan   1. Ventricular tachycardia: 12-beat run, asymptomatic. Continue Coreg and Mexitil. K 3.8 today, Mg 2.1 on 2/21.  2. Acute on chronic systolic HF: 675 cc output in last 24 hrs, no significant symptomatic improvement. On Coreg, hydralazine, and long-acting nitrates.  I am unable to add ACE inhibitors, angiotensin receptor blockers, or angiotensin receptor-neprilysin inhibitors due to advanced chronic kidney disease. By physical exam, he may be developing a left-sided pleural effusion. CXR yesterday showed slight improvement in CHF. On Lasix IV 20 mg bid. I will increase to 40 mg bid with close monitoring of renal function.  3. CAD: Cath results reviewed above. Continue medical Rx with Coreg and Imdur. No longer on statin.  4. Bioprosthetic  AVR: Functioning normally.  5. CRT-D: Functioning normally. NSVT, no Coreg and Mexitil.  6. PAF: On low dose Eliquis. Coreg for HR control.  7. Hypertension: BP elevated. I am increasing diuretic dose so will monitor.     For questions or updates, please contact [place] Please consult [email or web address] for contact info under Cardiology/STEMI.      Signed, Kate Sable, MD  06/01/2017, 10:13 AM

## 2017-06-01 NOTE — Progress Notes (Addendum)
Pt weaned down to room air at rest, nasal canula at bedsid with one liter running in the event of sudden need.   Pt requests assistance with decreased appetite, admission navigator re-filed to reflect his dietary needs.   Post-It on Chart  Pt requests dietitian consult; as he wishes he had a better appetite. Pt states it has been three months since he last had a normal appetite.  Maud Deed Tobias-Diakun,RN 06/01/17 6:58 PM

## 2017-06-01 NOTE — Progress Notes (Addendum)
Patient resting comfortably during shift report. Denies complaints, but presents with swelling to right arm. Per night nurse and patient the puffiness has been present since yesterday. Coban is still in place on right [facility] s/p cath, requested removal. Successfully removed with pressure dressing in place by end of report

## 2017-06-01 NOTE — Evaluation (Signed)
Occupational Therapy Evaluation Patient Details Name: [name] MRN: [id number] DOB: 12-10-1932 Today's Date: 06/01/2017    History of Present Illness Pt is an 84 y.o. male admitted 05/26/17 with c/o weakness, fatigue, and abdominal discomfort; worked up for CHF exacerbation. CXR showed pleural effusion. S/p thoracentesis 2/18. PMH includes AICD placement, CABG (2009), AVR, HTN, DM, CKD, arthritis. Of note, admitted 2 weeks ago with c/o weakness; was d/c home with HHPT services.   Clinical Impression   PTA, pt was at home with his wife and per chart had assistance for ADL from aide and was participating with [facility] services. He reports that he did not utilize RW for functional mobility at home. Pt currently presents with significantly limited activity tolerance for ADL and decreased stability during standing tasks impacting his ability to participate at PLOF. He requires min guard assist for standing ADL and functional mobility at this time. Feel pt will need 24 hour hands on assistance to maximize safety post-acute D/C and recommend short-term SNF placement to maximize return to PLOF. Will continue to follow acutely.    Follow Up Recommendations  SNF;Supervision/Assistance - 24 hour    Equipment Recommendations  Other (comment)(TBD)    Recommendations for Other Services       Precautions / Restrictions Precautions Precautions: Fall Restrictions Weight Bearing Restrictions: No      Mobility Bed Mobility Overal bed mobility: Needs Assistance Bed Mobility: Supine to Sit     Supine to sit: Supervision;HOB elevated     General bed mobility comments: Supervision for safety.   Transfers Overall transfer level: Needs assistance Equipment used: 4-wheeled walker Transfers: Sit to/from Stand Sit to Stand: Min guard         General transfer comment: Min guard assist for safety.    Balance Overall balance assessment: Needs assistance Sitting-balance support: No upper  extremity supported;Feet supported Sitting balance-Leahy Scale: Fair     Standing balance support: No upper extremity supported;Bilateral upper extremity supported;During functional activity Standing balance-Leahy Scale: Fair Standing balance comment: Close min guard assist for static standing without UE support.                            ADL either performed or assessed with clinical judgement   ADL Overall ADL's : Needs assistance/impaired Eating/Feeding: Set up;Sitting   Grooming: Min guard;Standing   Upper Body Bathing: Set up;Sitting   Lower Body Bathing: Sit to/from stand;Min guard   Upper Body Dressing : Set up;Sitting   Lower Body Dressing: Sit to/from stand;Min guard   Toilet Transfer: Minimal assistance;Ambulation;RW;Min guard   Toileting- Water quality scientist and Hygiene: Min guard;Sit to/from stand Toileting - Clothing Manipulation Details (indicate cue type and reason): pulling up with grab bar     Functional mobility during ADLs: Minimal assistance;Rolling walker General ADL Comments: Up to min assist for functional mobility.      Vision Patient Visual Report: No change from baseline Vision Assessment?: No apparent visual deficits     Perception     Praxis      Pertinent Vitals/Pain Pain Assessment: No/denies pain     Hand Dominance     Extremity/Trunk Assessment Upper Extremity Assessment Upper Extremity Assessment: Generalized weakness   Lower Extremity Assessment Lower Extremity Assessment: Generalized weakness       Communication Communication Communication: No difficulties   Cognition Arousal/Alertness: Awake/alert Behavior During Therapy: WFL for tasks assessed/performed Overall Cognitive Status: Within Functional Limits for tasks assessed  General Comments  DOE 2/4 throughout on 2L O2    Exercises     Shoulder Instructions      Home Living Family/patient  expects to be discharged to:: Private residence Living Arrangements: Spouse/significant other Available Help at Discharge: Family;Available 24 hours/day Type of Home: House Home Access: Ramped entrance;Stairs to enter Entrance Stairs-Number of Steps: 1 Entrance Stairs-Rails: Right Home Layout: One level     Bathroom Shower/Tub: Teacher, early years/pre: Handicapped height     Home Equipment: Cane - quad;Bedside commode;Grab bars - toilet;Grab bars - tub/shower   Additional Comments: Wife is available for 24/7 supervision but per chart review she has her own health issues. Unsure if she will be able to provide assistance.       Prior Functioning/Environment Level of Independence: Needs assistance  Gait / Transfers Assistance Needed: Reports does not use RW for mobility at home.  ADL's / Homemaking Assistance Needed: Assistance from home aide for ADL specifically bathing per chart. Pt reports independence.    Comments: Reports he does not complete showers, just does sponge baths        OT Problem List: Decreased strength;Decreased range of motion;Decreased activity tolerance;Impaired balance (sitting and/or standing);Decreased safety awareness;Decreased knowledge of use of DME or AE;Decreased knowledge of precautions      OT Treatment/Interventions: Self-care/ADL training;Therapeutic exercise;Energy conservation;DME and/or AE instruction;Therapeutic activities;Patient/family education;Balance training    OT Goals(Current goals can be found in the care plan section) Acute Rehab OT Goals Patient Stated Goal: Get stronger OT Goal Formulation: With patient Time For Goal Achievement: 06/15/17 Potential to Achieve Goals: Good ADL Goals Pt Will Perform Grooming: with modified independence;standing(3 consecutive tasks) Pt Will Perform Lower Body Dressing: with modified independence;sit to/from stand Pt Will Transfer to Toilet: with modified independence;ambulating;bedside  commode Pt Will Perform Toileting - Clothing Manipulation and hygiene: with modified independence;sit to/from stand Additional ADL Goal #1: Pt will demonstrate improved activity tolerance for ADL to complete 10 minutes of standing ADL with no therapeutic rest breaks.  OT Frequency: Min 2X/week   Barriers to D/C:            Co-evaluation              AM-PAC PT "6 Clicks" Daily Activity     Outcome Measure Help from another person eating meals?: A Little Help from another person taking care of personal grooming?: A Little Help from another person toileting, which includes using toliet, bedpan, or urinal?: A Little Help from another person bathing (including washing, rinsing, drying)?: A Little Help from another person to put on and taking off regular upper body clothing?: A Little Help from another person to put on and taking off regular lower body clothing?: A Little 6 Click Score: 18   End of Session Equipment Utilized During Treatment: Gait belt;Rolling walker  Activity Tolerance: Patient tolerated treatment well Patient left: in bed;with call bell/phone within reach;with bed alarm set(seated at EOB)  OT Visit Diagnosis: Unsteadiness on feet (R26.81);Muscle weakness (generalized) (M62.81)                Time: 8366-2947 OT Time Calculation (min): 12 min Charges:  OT General Charges $OT Visit: 1 Visit OT Evaluation $OT Eval Moderate Complexity: 1 Mod G-Codes:     Norman Herrlich, MS OTR/L  Pager: [place] D[place] Flemister 06/01/2017, 4:48 PM

## 2017-06-01 NOTE — Progress Notes (Signed)
PROGRESS NOTE    [name]  LNL:[id number] DOB: [birth year] DOA: 05/26/2017 PCP: [name], MD   Chief Complaint  Patient presents with  . Shortness of Breath  . Weakness    Brief Narrative:  HPI On 05/26/2017 by Dr. Bonnell [name] is an 84 y.o. male with past medical history significant for cardiomyopathy with an EF of 25% with ICD in place, atrial fibrillation, COPD, status post AVR, postural hypotension, DM who was discharged 15 days ago when he presented for for increasing weakness, weight loss and shortness of breath. Workup revealed hyperthyroidism which was thought to be secondary to amiodarone. Patient was started on methimazole and discharged home for follow-up with endocrinology.  Patient now presents stating that he did well for about 2 days after discharge and "it started getting bad again". Patient's main complaints are profound difficulty with ambulation. He describes a feeling of "feeling sick in my stomach" every time he tries to walk. He is also extremely short of breath with ambulation. Patient notes these symptoms have been present pretty much without change for the past 2 weeks however he came in today because his children forced him to come because he was unable to get off the couch because he was so weak. Patient does admit to some orthopnea which is also been going on for the past couple of weeks. Patient notes he is unable to walk across even a couple of steps due to discomfort in his abdominal area and shortness of breath.  Patient also complains of difficulty eating although it's unclear to me whether this is dysphagia, anorexia or gastric parous cyst. He complains nausea and a discomfort and abdominal area almost all the time but again it's worse with any exertion. Patient denies any fevers or chills but does admit to sweats during the day which have been present for several months. These are been attributed to hyperthyroidism. He also notes  a 20 pound weight loss over the past couple of weeks which he also mentioned on last admission last month. Patient also notes he's had several episodes of paroxysmal coughing such that he felt that he was cannot vomit. These have been occurring over the past several weeks. These are entirely nonproductive without hemoptysis or any phlegm production.  Interim history  Patient admitted for right pleural effusion, had thoracentesis on 05/27/2017.  Cardiology consulted and appreciated.  Patient also has sustained V. tach this morning approximately 7 minutes.  Patient also with significant nausea, CT abdomen and pelvis obtained.  GI consulted. Pending gastric emptying study today. Assessment & Plan   Ventricular tachycardia -Rapid response was called the morning of 05/29/2017 for VT, patient was instructed to perform vagal maneuvers, his tachycardia eventually broke. -patient has remained asymptomatic -potassium 3.8 today, will add on magnesium level and replace potassium (goal of 4) -cardiology possibly considering catheterization prior starting inotropic therapy (in PN on 2/19) -Continue Mexiletine 242m BID  Acute on chronic systolic/diastolic heart failure -Echocardiogram showed an EF of 240-81% grade 2 diastolic dysfunction -BNP on admission greater than 4500 -Cardiology consulted and appreciated -s/p heart catheterization showing three-vessel coronary disease with stable revascularization with patent LIMA-LAD, SVG-diagonal and SVG-RCA.  Moderate pH due to mixture of pulmonary venous and pulmonary atrial hypertension.  Normal cardiac output no evidence of thyrotoxicosis heart disease or pH.  Recommended to restart IV diuresis. -Monitor intake and output, daily weights -Continue coreg -Not on ACE inhibitor or ARB due to renal insufficiency -placed on IV lasix 259mBID-  cardiology increasing today to 19m BID  Elevated troponin -Troponin 0.13, 0.10, 0.10 -Cardiology consulted and  appreciated -Echocardiogram as above  Right pleural effusion -Noted on chest x-ray 05/26/2017 -Interventional radiology consulted and appreciated, Eliquis was held -Status post thoracentesis, 2 L drained -Fluid culture shows no growth, Gram stain showed no organisms -Repeat CXR on 2/22 reviewed and shows improvement in CHF, small B/L pleural effusions -as above, lasix increased, will continue to monitor  Atrial fibrillation, paroxysmal -Continue Coreg -Currently rate controlled -Continue Eliquis  Abdominal discomfort -No longer complaining of abdominal pain, etiology unclear -CT abdomen pelvis showed moderate bilateral pleural effusions with adjacent subsegmental atelectasis.  Sigmoid diverticulosis without inflammation.  No acute abnormality seen in the abdomen or pelvis. -mexiletine can be associated with GI discomfort, initially held however restarted by cardiology for VT -?gastroparesis -GI consulted and appreciated, recommended gastric emptying study and possible trial of promethazine.  Patient may need erythromycin/Reglan however pending further recommendations from cardiology given these medications can prolong QT -gastric emptying study normal -currently on heat healthy diet and tolerating well  Hyperthyroidism -TSH less than 0.010, T3 4.2, free T4 4.01 -Continue methimazole -Patient follows with endocrinology, Dr. GCruzita Lederer  Coughing -Suspect secondary to pleural effusion -Respiratory viral panel negative -continue antitussives  Dyspnea -Likely multifactorial including pleural effusion versus CHF versus atrial fibrillation -Patient also with history of COPD -Continue neb treatments as well as Lasix  Essential hypertension -Continue Coreg, hydralazine, Lasix (will place on IV)  Chronic kidney disease, stage III -Creatinine 1.22 today, appears stable -continue to monitor BMP  CAD -Status post CABG -Currently chest pain-free -continue coreg, imdur -Statin  discontinued  GERD/history of Barrett's esophagus -Continue Protonix/pepcid  Generalized weakness and fatigue/failure to thrive -Patient has had weight loss and shortness of breath for quite some time -Possibly secondary to the above items -Will continue to monitor and treat accordingly -PT recommended SNF, social work consulted  DVT Prophylaxis  Eliquis  Code Status: Full  Family Communication: none at bedside  Disposition Plan: Admitted. Likely SNF at discharge. Pending further recommendations from cardiology and gastroenterology.  Continue IV diuresis   Consultants Cardiology Gastroenterology  Procedures  Echocardiogram  Antibiotics   Anti-infectives (From admission, onward)   None      Subjective:   JCindee Saltseen and examined today.  Patient feels stronger than yesterday.  Continues to have shortness of breath.  Denies current chest pain, abdominal pain, nausea or vomiting, diarrhea or constipation, dizziness or headache. Objective:   Vitals:   05/31/17 1934 06/01/17 0309 06/01/17 1100 06/01/17 1137  BP: (!) 142/62 (!) 152/86 (!) 128/59 (!) 156/41  Pulse: 64 76  75  Resp: _0 Temp: (!) 97.5 F (36.4 C) 98 F (36.7 C)  (!) 97.3 F (36.3 C)  TempSrc: Oral Oral  Oral  SpO2: 100% 100%  99%  Weight:  84.5 kg (186 lb 3.2 oz)    Height:        Intake/Output Summary (Last 24 hours) at 06/01/2017 1155 Last data filed at 06/01/2017 1100 Gross per 24 hour  Intake 294 ml  Output 950 ml  Net -656 ml   Filed Weights   05/30/17 0543 05/31/17 0500 06/01/17 0309  Weight: 85.1 kg (187 lb 11.2 oz) 85.4 kg (188 lb 4.8 oz) 84.5 kg (186 lb 3.2 oz)   Exam  General: Well developed, chronically ill-appearing, no apparent distress  HEENT: NCAT, mucous membranes moist.   Neck: Supple, no JVD  Cardiovascular: S1 S2 auscultated,  RRR, no murmurs  Respiratory: Diminished breath sounds particularly at the bases, no wheezing or crackles noted  Abdomen: Soft,  nontender, nondistended, + bowel sounds  Extremities: warm dry without cyanosis clubbing or edema  Neuro: AAOx3, nonfocal  Psych: appropriate mood and affect, pleasant   Data Reviewed: I have personally reviewed following labs and imaging studies  CBC: Recent Labs  Lab 05/26/17 1226  05/28/17 0445 05/29/17 0652 05/30/17 0457 05/31/17 0316 06/01/17 0531  WBC 9.8   < > 11.8* 11.4* 10.0 9.9 9.8  NEUTROABS 6.3  --  7.2 6.6  --   --   --   HGB 12.8*   < > 12.7* 12.5* 11.8* 12.4* 11.8*  HCT 40.0   < > 40.4 40.0 37.0* 38.7* 37.4*  MCV 87.7   < > 87.8 88.3 87.1 87.0 86.8  PLT 376   < > 320 361 305 343 296   < > = values in this interval not displayed.   Basic Metabolic Panel: Recent Labs  Lab 05/27/17 1846 05/28/17 0445 05/29/17 0652 05/30/17 0457 05/31/17 0316 06/01/17 0531  NA  --  140 137 137 138 136  K  --  3.9 4.1 4.0 4.1 3.8  CL  --  105 101 104 103 103  CO2  --  _0 GLUCOSE  --  110* 119* 94 106* 103*  BUN  --  31* 28* 24* 19 17  CREATININE  --  1.37* 1.34* 1.22 1.26* 1.22  CALCIUM  --  8.2* 8.4* 8.0* 8.4* 8.1*  MG 2.1 2.0 2.0 2.1  --   --   PHOS  --  3.3 2.8  --   --   --    GFR: Estimated Creatinine Clearance: 51.2 mL/min (by C-G formula based on SCr of 1.22 mg/dL). Liver Function Tests: Recent Labs  Lab 05/26/17 1226 05/27/17 0617 05/28/17 0445 05/29/17 0652  AST 23 32 21 29  ALT _1 ALKPHOS 61 56 54 60  BILITOT 0.9 0.9 0.9 0.9  PROT 5.8* 5.8* 5.4* 5.7*  ALBUMIN 2.9* 2.8* 2.5* 2.5*   No results for input(s): LIPASE, AMYLASE in the last 168 hours. No results for input(s): AMMONIA in the last 168 hours. Coagulation Profile: Recent Labs  Lab 05/26/17 1226  INR 1.65   Cardiac Enzymes: Recent Labs  Lab 05/26/17 1226 05/26/17 1812 05/26/17 2258 05/27/17 0617  TROPONINI 0.11* 0.13* 0.10* 0.10*   BNP (last 3 results) Recent Labs    04/18/17 1126 04/23/17 1048 05/06/17 1110  PROBNP 10,928* 10,848* 22,420*    HbA1C: No results for input(s): HGBA1C in the last 72 hours. CBG: Recent Labs  Lab 05/31/17 1146 05/31/17 1646 05/31/17 2049 06/01/17 0736 06/01/17 1117  GLUCAP 151* 101* 119* 110* 159*   Lipid Profile: No results for input(s): CHOL, HDL, LDLCALC, TRIG, CHOLHDL, LDLDIRECT in the last 72 hours. Thyroid Function Tests: No results for input(s): TSH, T4TOTAL, FREET4, T3FREE, THYROIDAB in the last 72 hours. Anemia Panel: No results for input(s): VITAMINB12, FOLATE, FERRITIN, TIBC, IRON, RETICCTPCT in the last 72 hours. Urine analysis:    Component Value Date/Time   COLORURINE YELLOW 05/08/2017 [place] 05/08/2017 1353   LABSPEC 1.033 (H) 05/08/2017 1353   PHURINE 5.0 05/08/2017 1353   GLUCOSEU NEGATIVE 05/08/2017 1353   HGBUR NEGATIVE 05/08/2017 [place] 05/08/2017 [place] 05/08/2017 1353   PROTEINUR NEGATIVE 05/08/2017 1353   UROBILINOGEN 0.2 03/08/2008 1519  NITRITE NEGATIVE 05/08/2017 [place] 05/08/2017 1353   Sepsis Labs: _0 (procalcitonin:4,lacticidven:4)  ) Recent Results (from the past 240 hour(s))  MRSA PCR Screening     Status: None   Collection Time: 05/27/17  8:08 AM  Result Value Ref Range Status   MRSA by PCR NEGATIVE NEGATIVE Final    Comment:        The GeneXpert MRSA Assay (FDA approved for NASAL specimens only), is one component of a comprehensive MRSA colonization surveillance program. It is not intended to diagnose MRSA infection nor to guide or monitor treatment for MRSA infections. Performed at [facility], [place] [address], [place], [name] [address]   Gram stain     Status: None   Collection Time: 05/27/17  2:25 PM  Result Value Ref Range Status   Specimen Description FLUID RIGHT PLEURAL  Final   Special Requests   Final    NONE Performed at [facility], [place] [address], [place], [place] [address]    Gram Stain   Final    WBC PRESENT,  PREDOMINANTLY PMN NO ORGANISMS SEEN CYTOSPIN SMEAR    Report Status 05/28/2017 FINAL  Final  Culture, body fluid-bottle     Status: None (Preliminary result)   Collection Time: 05/27/17  2:25 PM  Result Value Ref Range Status   Specimen Description FLUID RIGHT PLEURAL  Final   Special Requests BOTTLES DRAWN AEROBIC AND ANAEROBIC  Final   Culture   Final    NO GROWTH 4 DAYS Performed at [facility], [place] [address] [place], [place] [address]    Report Status PENDING  Incomplete  Respiratory Panel by PCR     Status: None   Collection Time: 05/28/17 11:56 PM  Result Value Ref Range Status   Adenovirus NOT DETECTED NOT DETECTED Final   Coronavirus 229E NOT DETECTED NOT DETECTED Final   Coronavirus HKU1 NOT DETECTED NOT DETECTED Final   Coronavirus NL63 NOT DETECTED NOT DETECTED Final   Coronavirus OC43 NOT DETECTED NOT DETECTED Final   Metapneumovirus NOT DETECTED NOT DETECTED Final   Rhinovirus / Enterovirus NOT DETECTED NOT DETECTED Final   Influenza A NOT DETECTED NOT DETECTED Final   Influenza B NOT DETECTED NOT DETECTED Final   Parainfluenza Virus 1 NOT DETECTED NOT DETECTED Final   Parainfluenza Virus 2 NOT DETECTED NOT DETECTED Final   Parainfluenza Virus 3 NOT DETECTED NOT DETECTED Final   Parainfluenza Virus 4 NOT DETECTED NOT DETECTED Final   Respiratory Syncytial Virus NOT DETECTED NOT DETECTED Final   Bordetella pertussis NOT DETECTED NOT DETECTED Final   Chlamydophila pneumoniae NOT DETECTED NOT DETECTED Final   Mycoplasma pneumoniae NOT DETECTED NOT DETECTED Final    Comment: Performed at [facility], [place] [address] [place],  [address]      Radiology Studies: Dg Chest 2 View  Result Date: 05/31/2017 CLINICAL DATA:  Shortness of breath. EXAM: CHEST  2 VIEW COMPARISON:  05/28/2017. FINDINGS: Cardiac pacer scratched it AICD noted in stable position. Prior CABG. Cardiomegaly. Bilateral basilar interstitial prominence and small bilateral pleural  effusions consistent with CHF. Slight improvement from prior exam. IMPRESSION: AICD noted stable position. Cardiomegaly with bilateral from interstitial prominence and bilateral pleural effusions consistent with CHF. Slight improvement from prior exam. Electronically Signed   By: Marcello Moores  Register   On: 05/31/2017 14:03   Nm Gastric Emptying  Result Date: 05/30/2017 CLINICAL DATA:  Severe nausea and dry heaves since the summer of 2019.  EXAM: NUCLEAR MEDICINE GASTRIC EMPTYING SCAN TECHNIQUE: After oral ingestion of radiolabeled meal, sequential abdominal images were obtained for 4 hours. Percentage of activity emptying the stomach was calculated at 1 hour, 2 hour, 3 hour, and 4 hours. RADIOPHARMACEUTICALS:  1.8 mCi Tc-60msulfur colloid in standardized meal COMPARISON:  None. FINDINGS: Expected location of the stomach in the left upper quadrant. Ingested meal empties the stomach gradually over the course of the study. 38.7 emptied at 1 hr ( normal >= 10%) 78.6 emptied at 2 hr ( normal >= 40%) 88.7 emptied at 3 hr ( normal >= 70%) 93.2 emptied at 4 hr ( normal >= 90%) IMPRESSION: Normal gastric emptying study. Electronically Signed   By: TInge RiseM.D.   On: 05/30/2017 14:22     Scheduled Meds: . apixaban  2.5 mg Oral BID  . carvedilol  6.25 mg Oral BID WC  . famotidine  20 mg Oral QHS  . feeding supplement (ENSURE ENLIVE)  237 mL Oral BID BM  . furosemide  40 mg Intravenous BID  . hydrALAZINE  12.5 mg Oral TID  . isosorbide mononitrate  30 mg Oral QHS  . methimazole  10 mg Oral BID  . metoprolol tartrate  2.5 mg Intravenous Once  . mexiletine  250 mg Oral Q12H  . pantoprazole  40 mg Oral QAC breakfast  . sodium chloride flush  3 mL Intravenous Q12H   Continuous Infusions: . sodium chloride       LOS: 6 days   Time Spent in minutes  30 minutes  Emanuelle Bastos D.O. on 06/01/2017 at 11:55 AM  Between 7am to 7pm - Pager - [phone] After 7pm go to [email or web address] - password  [email or web address]  And look for the night coverage person covering for me after hours  [facility] Office  [phone]

## 2017-06-02 DIAGNOSIS — R05 Cough: Secondary | ICD-10-CM

## 2017-06-02 DIAGNOSIS — Z953 Presence of xenogenic heart valve: Secondary | ICD-10-CM

## 2017-06-02 DIAGNOSIS — R110 Nausea: Secondary | ICD-10-CM

## 2017-06-02 DIAGNOSIS — Z95810 Presence of automatic (implantable) cardiac defibrillator: Secondary | ICD-10-CM

## 2017-06-02 DIAGNOSIS — I25118 Atherosclerotic heart disease of native coronary artery with other forms of angina pectoris: Secondary | ICD-10-CM

## 2017-06-02 LAB — BASIC METABOLIC PANEL
Anion gap: 12 (ref 5–15)
BUN: 21 mg/dL — ABNORMAL HIGH (ref 6–20)
CALCIUM: 8 mg/dL — AB (ref 8.9–10.3)
CO2: 22 mmol/L (ref 22–32)
CREATININE: 1.33 mg/dL — AB (ref 0.61–1.24)
Chloride: 102 mmol/L (ref 101–111)
GFR, EST AFRICAN AMERICAN: 56 mL/min — AB (ref >60)
GFR, EST NON AFRICAN AMERICAN: 48 mL/min — AB (ref >60)
GLUCOSE: 109 mg/dL — AB (ref 65–99)
Potassium: 4.4 mmol/L (ref 3.5–5.1)
Sodium: 136 mmol/L (ref 135–145)

## 2017-06-02 LAB — CUP PACEART REMOTE DEVICE CHECK
Battery Remaining Longevity: 69 mo
Battery Voltage: 2.98 V
Brady Statistic AP VP Percent: 11.16 %
Brady Statistic RA Percent Paced: 11.01 %
Brady Statistic RV Percent Paced: 93.12 %
HighPow Impedance: 70 Ohm
Implantable Lead Implant Date: 20170113
Implantable Lead Implant Date: 20170113
Implantable Lead Location: 753858
Implantable Lead Location: 753860
Implantable Lead Model: 4298
Implantable Lead Model: 5076
Implantable Pulse Generator Implant Date: 20170113
Lead Channel Impedance Value: 323 Ohm
Lead Channel Impedance Value: 323 Ohm
Lead Channel Impedance Value: 399 Ohm
Lead Channel Impedance Value: 399 Ohm
Lead Channel Impedance Value: 456 Ohm
Lead Channel Impedance Value: 570 Ohm
Lead Channel Impedance Value: 589 Ohm
Lead Channel Pacing Threshold Amplitude: 0.5 V
Lead Channel Sensing Intrinsic Amplitude: 10.125 mV
Lead Channel Sensing Intrinsic Amplitude: 10.125 mV
Lead Channel Sensing Intrinsic Amplitude: 2.375 mV
Lead Channel Setting Pacing Amplitude: 1.5 V
Lead Channel Setting Pacing Pulse Width: 0.4 ms
Lead Channel Setting Pacing Pulse Width: 0.4 ms
Lead Channel Setting Sensing Sensitivity: 0.3 mV
MDC IDC LEAD IMPLANT DT: 20170113
MDC IDC LEAD LOCATION: 753859
MDC IDC MSMT LEADCHNL LV IMPEDANCE VALUE: 323 Ohm
MDC IDC MSMT LEADCHNL LV IMPEDANCE VALUE: 342 Ohm
MDC IDC MSMT LEADCHNL LV IMPEDANCE VALUE: 646 Ohm
MDC IDC MSMT LEADCHNL LV IMPEDANCE VALUE: 665 Ohm
MDC IDC MSMT LEADCHNL LV IMPEDANCE VALUE: 665 Ohm
MDC IDC MSMT LEADCHNL LV PACING THRESHOLD AMPLITUDE: 0.375 V
MDC IDC MSMT LEADCHNL LV PACING THRESHOLD PULSEWIDTH: 0.4 ms
MDC IDC MSMT LEADCHNL RA IMPEDANCE VALUE: 399 Ohm
MDC IDC MSMT LEADCHNL RA PACING THRESHOLD AMPLITUDE: 0.875 V
MDC IDC MSMT LEADCHNL RA PACING THRESHOLD PULSEWIDTH: 0.4 ms
MDC IDC MSMT LEADCHNL RA SENSING INTR AMPL: 2.375 mV
MDC IDC MSMT LEADCHNL RV PACING THRESHOLD PULSEWIDTH: 0.4 ms
MDC IDC SESS DTM: 20190202164521
MDC IDC SET LEADCHNL RA PACING AMPLITUDE: 1.75 V
MDC IDC SET LEADCHNL RV PACING AMPLITUDE: 2 V
MDC IDC STAT BRADY AP VS PERCENT: 0.24 %
MDC IDC STAT BRADY AS VP PERCENT: 85.25 %
MDC IDC STAT BRADY AS VS PERCENT: 3.36 %

## 2017-06-02 LAB — CBC
HEMATOCRIT: 36.6 % — AB (ref 39.0–52.0)
Hemoglobin: 11.6 g/dL — ABNORMAL LOW (ref 13.0–17.0)
MCH: 27.2 pg (ref 26.0–34.0)
MCHC: 31.7 g/dL (ref 30.0–36.0)
MCV: 85.9 fL (ref 78.0–100.0)
PLATELETS: 315 10*3/uL (ref 150–400)
RBC: 4.26 MIL/uL (ref 4.22–5.81)
RDW: 14.3 % (ref 11.5–15.5)
WBC: 9.4 10*3/uL (ref 4.0–10.5)

## 2017-06-02 LAB — GLUCOSE, CAPILLARY
GLUCOSE-CAPILLARY: 114 mg/dL — AB (ref 65–99)
Glucose-Capillary: 152 mg/dL — ABNORMAL HIGH (ref 65–99)
Glucose-Capillary: 115 mg/dL — ABNORMAL HIGH (ref 65–99)
Glucose-Capillary: 148 mg/dL — ABNORMAL HIGH (ref 65–99)

## 2017-06-02 MED ORDER — HYDRALAZINE HCL 25 MG PO TABS
25.0000 mg | ORAL_TABLET | Freq: Three times a day (TID) | ORAL | Status: DC
  Administered 2017-06-02 – 2017-06-04 (×8): 25 mg via ORAL
  Filled 2017-06-02 (×8): qty 1

## 2017-06-02 MED ORDER — MIRTAZAPINE 15 MG PO TABS
15.0000 mg | ORAL_TABLET | Freq: Every day | ORAL | Status: DC
  Administered 2017-06-02 – 2017-06-03 (×2): 15 mg via ORAL
  Filled 2017-06-02 (×2): qty 1

## 2017-06-02 MED ORDER — FUROSEMIDE 10 MG/ML IJ SOLN
40.0000 mg | Freq: Every day | INTRAMUSCULAR | Status: DC
  Filled 2017-06-02: qty 4

## 2017-06-02 NOTE — Progress Notes (Signed)
Progress Note  Patient Name: [name] Date of Encounter: 06/02/2017  Primary Cardiologist: [name], MD   Subjective   Says shortness of breath has improved. Denies chest pain. Primary complaint relates to lack of appetite.  Inpatient Medications    Scheduled Meds: . apixaban  2.5 mg Oral BID  . carvedilol  6.25 mg Oral BID WC  . famotidine  20 mg Oral QHS  . feeding supplement (ENSURE ENLIVE)  237 mL Oral BID BM  . furosemide  40 mg Intravenous BID  . hydrALAZINE  12.5 mg Oral TID  . isosorbide mononitrate  30 mg Oral QHS  . methimazole  10 mg Oral BID  . metoprolol tartrate  2.5 mg Intravenous Once  . mexiletine  250 mg Oral Q12H  . pantoprazole  40 mg Oral QAC breakfast  . sodium chloride flush  3 mL Intravenous Q12H   Continuous Infusions: . sodium chloride     PRN Meds: sodium chloride, acetaminophen, acetaminophen, benzonatate, ipratropium-albuterol, ondansetron **OR** ondansetron (ZOFRAN) IV, ondansetron (ZOFRAN) IV, oxyCODONE, promethazine, sodium chloride flush, traMADol   Vital Signs    Vitals:   06/01/17 1854 06/01/17 2029 06/01/17 2135 06/02/17 0534  BP: (!) 135/33 (!) 144/49 (!) 145/43 (!) 154/58  Pulse: 73 78 77 85  Resp: _0 Temp:  97.7 F (36.5 C)  97.6 F (36.4 C)  TempSrc:  Oral  Oral  SpO2: 97% 96% 97% 92%  Weight:    182 lb 14.4 oz (83 kg)  Height:        Intake/Output Summary (Last 24 hours) at 06/02/2017 0905 Last data filed at 06/02/2017 0800 Gross per 24 hour  Intake 1144 ml  Output 2700 ml  Net -1556 ml   Filed Weights   05/31/17 0500 06/01/17 0309 06/02/17 0534  Weight: 188 lb 4.8 oz (85.4 kg) 186 lb 3.2 oz (84.5 kg) 182 lb 14.4 oz (83 kg)    Telemetry    V-paced - Personally Reviewed  ECG    n/a - Personally Reviewed  Physical Exam   GEN: No acute distress. Elderly, frail, chronically ill appearing. Neck: No JVD Cardiac: RRR, no murmurs, rubs, or gallops.  Respiratory: Clear to auscultation  bilaterally. GI: Soft, nontender, non-distended  MS: No edema; No deformity. Neuro:  Nonfocal  Psych: Normal affect   Labs    Chemistry Recent Labs  Lab 05/27/17 0617 05/28/17 0445 05/29/17 0919  05/31/17 0316 06/01/17 0531 06/02/17 0324  NA 145 140 137   < > 138 136 136  K 3.6 3.9 4.1   < > 4.1 3.8 4.4  CL 108 105 101   < > 103 103 102  CO2 _1 < > _2 GLUCOSE 95 110* 119*   < > 106* 103* 109*  BUN 31* 31* 28*   < > 19 17 21*  CREATININE 1.50* 1.37* 1.34*   < > 1.26* 1.22 1.33*  CALCIUM 8.4* 8.2* 8.4*   < > 8.4* 8.1* 8.0*  PROT 5.8* 5.4* 5.7*  --   --   --   --   ALBUMIN 2.8* 2.5* 2.5*  --   --   --   --   AST 32 21 29  --   --   --   --   ALT _3 --   --   --   --   ALKPHOS 56 54 60  --   --   --   --  BILITOT 0.9 0.9 0.9  --   --   --   --   GFRNONAA 42* 46* 48*   < > 51* 53* 48*  GFRAA 48* 54* 55*   < > 60* >60 56*  ANIONGAP _0 < > _1 < > = values in this interval not displayed.     Hematology Recent Labs  Lab 05/31/17 0316 06/01/17 0531 06/02/17 0324  WBC 9.9 9.8 9.4  RBC 4.45 4.31 4.26  HGB 12.4* 11.8* 11.6*  HCT 38.7* 37.4* 36.6*  MCV 87.0 86.8 85.9  MCH 27.9 27.4 27.2  MCHC 32.0 31.6 31.7  RDW 14.5 14.5 14.3  PLT 343 296 315    Cardiac Enzymes Recent Labs  Lab 05/26/17 1226 05/26/17 1812 05/26/17 2258 05/27/17 0617  TROPONINI 0.11* 0.13* 0.10* 0.10*   No results for input(s): TROPIPOC in the last 168 hours.   BNP Recent Labs  Lab 05/26/17 1226  BNP >4,500.0*     DDimer No results for input(s): DDIMER in the last 168 hours.   Radiology    Dg Chest 2 View  Result Date: 05/31/2017 CLINICAL DATA:  Shortness of breath. EXAM: CHEST  2 VIEW COMPARISON:  05/28/2017. FINDINGS: Cardiac pacer scratched it AICD noted in stable position. Prior CABG. Cardiomegaly. Bilateral basilar interstitial prominence and small bilateral pleural effusions consistent with CHF. Slight improvement from prior exam. IMPRESSION:  AICD noted stable position. Cardiomegaly with bilateral from interstitial prominence and bilateral pleural effusions consistent with CHF. Slight improvement from prior exam. Electronically Signed   By: Marcello Moores  Register   On: 05/31/2017 14:03    Cardiac Studies   Echo 05/27/17: Study Conclusions - Left ventricle: The cavity size was normal. Wall thickness was increased in a pattern of mild LVH. Systolic function was severely reduced. The estimated ejection fraction was in the range of 20% to 25%. There is akinesis of the inferolateral, inferior, and inferoseptal myocardium. Features are consistent with a pseudonormal left ventricular filling pattern, with concomitant abnormal relaxation and increased filling pressure (grade 2 diastolic dysfunction). - Aortic valve: A pericardial tissue valve bioprosthesis was present and functioning normally. Peak velocity (S): 226 cm/s. Mean gradient (S): 10 mm Hg. Valve area (VTI): 1.52 cm^2. Valve area (Vmax): 1.5 cm^2. Valve area (Vmean): 1.59 cm^2. - Mitral valve: Calcified annulus. Mildly thickened leaflets . There was mild regurgitation. - Left atrium: The atrium was severely dilated. - Right ventricle: Pacer wire or catheter noted in right ventricle. - Pulmonary arteries: Systolic pressure was mildly increased. PA peak pressure: 35 mm Hg (S).  Impressions: - Compared to the prior study, there has been no significant interval change.  Cath 05/31/17    Prox LAD to Mid LAD lesion is 50% stenosed.  Mid LAD to Dist LAD lesion is 25% stenosed.  1st Mrg lesion is 50% stenosed.  Prox RCA to Dist RCA lesion is 100% stenosed.  And is large.  Prox Graft to Mid Graft lesion is 30% stenosed.  And is large.  The flow in the graft is reversed.  There is competitive flow.  And is large.  There is competitive flow.  Ost 1st Diag to 1st Diag lesion is 90% stenosed.  Findings:  Ao = 145/67 (99)  RA = 6 RV  = 70/9 PA = 69/30 (46) PCW = 26 v = 37 Fick cardiac output/index =4.7/2.3 PVR = 4.3 FA sat = 98% PA sat = 63%, 60%    Patient Profile  84 y.o. male with multiple medica problems. History of AVR/CABG, CM with CRTD. Recent hyperthyroidism on amiodarone. We were called to see for acute on chronic CHF and VT.  Assessment & Plan    1. Ventricular tachycardia: No VT since yesterdray. Continue Coreg and Mexitil. K 4.4 today, Mg 2.1 yesterday.  2. Acute on chronic systolic HF: 1855 cc output in last 24 hrs with symptomatic improvement with increase yesterday of IV Lasix to 40 mg bid. However, slight bump in BUN/creatinine.  I will switch IV Lasix to 40 mg daily to start tomorrow. On Coreg, hydralazine, and long-acting nitrates. BP is elevated. I will increase hydralazine to 25 mg tid. I am unable to add ACE inhibitors, angiotensin receptor blockers, or angiotensin receptor-neprilysin inhibitors due to advanced chronic kidney disease.   3. CAD: Cath results reviewed above. Continue medical Rx with Coreg and Imdur. No longer on statin.  4. Bioprosthetic AVR: Functioning normally.  5. CRT-D: Functioning normally. NSVT, on Coreg and Mexitil.  6. PAF: On low dose Eliquis. Coreg for HR control.  7. Hypertension: BP is elevated. I will increase hydralazine to 25 mg tid.     For questions or updates, please contact [place] Please consult [email or web address] for contact info under Cardiology/STEMI.      Signed, Kate Sable, MD  06/02/2017, 9:05 AM

## 2017-06-02 NOTE — Progress Notes (Signed)
Patient resting comfortably during shift report. Denies complaints.  

## 2017-06-02 NOTE — Progress Notes (Signed)
PROGRESS NOTE    [name]  FWY:[id number] DOB: Jul 11, 1932 DOA: 05/26/2017 PCP: [name], MD   Chief Complaint  Patient presents with  . Shortness of Breath  . Weakness    Brief Narrative:  HPI On 05/26/2017 by Dr. Bonnell Public [name] is an 84 y.o. male with past medical history significant for cardiomyopathy with an EF of 25% with ICD in place, atrial fibrillation, COPD, status post AVR, postural hypotension, DM who was discharged 15 days ago when he presented for for increasing weakness, weight loss and shortness of breath. Workup revealed hyperthyroidism which was thought to be secondary to amiodarone. Patient was started on methimazole and discharged home for follow-up with endocrinology.  Patient now presents stating that he did well for about 2 days after discharge and "it started getting bad again". Patient's main complaints are profound difficulty with ambulation. He describes a feeling of "feeling sick in my stomach" every time he tries to walk. He is also extremely short of breath with ambulation. Patient notes these symptoms have been present pretty much without change for the past 2 weeks however he came in today because his children forced him to come because he was unable to get off the couch because he was so weak. Patient does admit to some orthopnea which is also been going on for the past couple of weeks. Patient notes he is unable to walk across even a couple of steps due to discomfort in his abdominal area and shortness of breath.  Patient also complains of difficulty eating although it's unclear to me whether this is dysphagia, anorexia or gastric parous cyst. He complains nausea and a discomfort and abdominal area almost all the time but again it's worse with any exertion. Patient denies any fevers or chills but does admit to sweats during the day which have been present for several months. These are been attributed to hyperthyroidism. He also notes  a 20 pound weight loss over the past couple of weeks which he also mentioned on last admission last month. Patient also notes he's had several episodes of paroxysmal coughing such that he felt that he was cannot vomit. These have been occurring over the past several weeks. These are entirely nonproductive without hemoptysis or any phlegm production.  Interim history  Patient admitted for right pleural effusion, had thoracentesis on 05/27/2017.  Cardiology consulted and appreciated.  Patient also has sustained V. tach this morning approximately 7 minutes.  Patient also with significant nausea, CT abdomen and pelvis obtained.  GI consulted. Pending gastric emptying study today. Assessment & Plan   Ventricular tachycardia -Rapid response was called the morning of 05/29/2017 for VT, patient was instructed to perform vagal maneuvers, his tachycardia eventually broke. -patient has remained asymptomatic -potassium 4.4, magnesium 2.1 -cardiology possibly considering catheterization prior starting inotropic therapy (in [place] on 2/19) -Continue Mexiletine 222m BID and coreg  Acute on chronic systolic/diastolic heart failure -Echocardiogram showed an EF of 227-74% grade 2 diastolic dysfunction -BNP on admission greater than 4500 -Cardiology consulted and appreciated -s/p heart catheterization showing three-vessel coronary disease with stable revascularization with patent LIMA-LAD, SVG-diagonal and SVG-RCA.  Moderate pH due to mixture of pulmonary venous and pulmonary atrial hypertension.  Normal cardiac output no evidence of thyrotoxicosis heart disease or pH.  Recommended to restart IV diuresis. -Monitor intake and output, daily weights -Continue coreg -Not on ACE inhibitor or ARB due to renal insufficiency -placed on IV lasix 219mBID- cardiology increasing today to 4045mID, patient to  start lasix 79m daily on 06/03/2017  Elevated troponin -Troponin 0.13, 0.10, 0.10 -Cardiology consulted and  appreciated -Echocardiogram as above  Right pleural effusion -Noted on chest x-ray 05/26/2017 -Interventional radiology consulted and appreciated, Eliquis was held -Status post thoracentesis, 2 L drained -Fluid culture shows no growth, Gram stain showed no organisms -Repeat CXR on 2/22 reviewed and shows improvement in CHF, small B/L pleural effusions -as above, lasix increased, will continue to monitor -will obtain repeat CXR on 2/25  Atrial fibrillation, paroxysmal -Continue Coreg -Currently rate controlled -Continue Eliquis  Abdominal discomfort -No longer complaining of abdominal pain, etiology unclear -CT abdomen pelvis showed moderate bilateral pleural effusions with adjacent subsegmental atelectasis.  Sigmoid diverticulosis without inflammation.  No acute abnormality seen in the abdomen or pelvis. -mexiletine can be associated with GI discomfort, initially held however restarted by cardiology for VT -?gastroparesis -GI consulted and appreciated, recommended gastric emptying study and possible trial of promethazine.  Patient may need erythromycin/Reglan however pending further recommendations from cardiology given these medications can prolong QT -gastric emptying study normal -currently on heat healthy diet and tolerating well  Hyperthyroidism -TSH less than 0.010, T3 4.2, free T4 4.01 -Continue methimazole -Patient follows with endocrinology, Dr. GCruzita Lederer  Coughing -Suspect secondary to pleural effusion -Respiratory viral panel negative -continue antitussives  Dyspnea -Likely multifactorial including pleural effusion versus CHF versus atrial fibrillation -Patient also with history of COPD -Continue neb treatments as well as Lasix  Essential hypertension -Continue Coreg, hydralazine, Lasix (will place on IV) -hydralazine increased today  Chronic kidney disease, stage III -Creatinine 1.22 today, appears stable -continue to monitor BMP  CAD -Status post  CABG -Currently chest pain-free -continue coreg, imdur -Statin discontinued  GERD/history of Barrett's esophagus -Continue Protonix/pepcid  Generalized weakness and fatigue/failure to thrive -Patient has had weight loss and shortness of breath for quite some time -Possibly secondary to the above items -Will continue to monitor and treat accordingly -PT recommended SNF, social work consulted  Loss of appetite -will add on low dose remeron -dietician consulted -has been ongoing for months  DVT Prophylaxis  Eliquis  Code Status: Full  Family Communication: none at bedside  Disposition Plan: Admitted. Likely SNF at discharge. Pending further recommendations from cardiology and gastroenterology.  Continue IV diuresis   Consultants Cardiology Gastroenterology  Procedures  Echocardiogram  Antibiotics   Anti-infectives (From admission, onward)   None      Subjective:   [name] Saltseen and examined today.  Patient feels he is getting stronger.  Complains of loss of appetite.  Currently denies chest pain, shortness of breath, abdominal pain, nausea or vomiting, diarrhea or constipation, dizziness or headache. Objective:   Vitals:   06/01/17 1854 06/01/17 2029 06/01/17 2135 06/02/17 0534  BP: (!) 135/33 (!) 144/49 (!) 145/43 (!) 154/58  Pulse: 73 78 77 85  Resp: _0 Temp:  97.7 F (36.5 C)  97.6 F (36.4 C)  TempSrc:  Oral  Oral  SpO2: 97% 96% 97% 92%  Weight:    83 kg (182 lb 14.4 oz)  Height:        Intake/Output Summary (Last 24 hours) at 06/02/2017 0944 Last data filed at 06/02/2017 0929 Gross per 24 hour  Intake 1384 ml  Output 2700 ml  Net -1316 ml   Filed Weights   05/31/17 0500 06/01/17 0309 06/02/17 0534  Weight: 85.4 kg (188 lb 4.8 oz) 84.5 kg (186 lb 3.2 oz) 83 kg (182 lb 14.4 oz)   Exam  General:  Well developed, chronically ill-appearing, thin, no apparent distress  HEENT: NCAT, mucous membranes moist.   Neck: Supple, no  JVD  Cardiovascular: S1 S2 auscultated, no rubs, murmurs or gallops. Regular rate and rhythm.  Respiratory: Diminished but clear breath sounds, no wheezing or crackles  Abdomen: Soft, nontender, nondistended, + bowel sounds  Extremities: warm dry without cyanosis clubbing or edema  Neuro: AAOx3, nonfocal  Skin: Without rashes exudates or nodules  Psych: appropriate mood and affect   Data Reviewed: I have personally reviewed following labs and imaging studies  CBC: Recent Labs  Lab 05/26/17 1226  05/28/17 0445 05/29/17 0652 05/30/17 0457 05/31/17 0316 06/01/17 0531 06/02/17 0324  WBC 9.8   < > 11.8* 11.4* 10.0 9.9 9.8 9.4  NEUTROABS 6.3  --  7.2 6.6  --   --   --   --   HGB 12.8*   < > 12.7* 12.5* 11.8* 12.4* 11.8* 11.6*  HCT 40.0   < > 40.4 40.0 37.0* 38.7* 37.4* 36.6*  MCV 87.7   < > 87.8 88.3 87.1 87.0 86.8 85.9  PLT 376   < > 320 361 305 343 296 315   < > = values in this interval not displayed.   Basic Metabolic Panel: Recent Labs  Lab 05/27/17 1846 05/28/17 0445 05/29/17 7017 05/30/17 0457 05/31/17 0316 06/01/17 0531 06/01/17 1159 06/02/17 0324  NA  --  140 137 137 138 136  --  136  K  --  3.9 4.1 4.0 4.1 3.8  --  4.4  CL  --  105 101 104 103 103  --  102  CO2  --  _0 --  22  GLUCOSE  --  110* 119* 94 106* 103*  --  109*  BUN  --  31* 28* 24* 19 17  --  21*  CREATININE  --  1.37* 1.34* 1.22 1.26* 1.22  --  1.33*  CALCIUM  --  8.2* 8.4* 8.0* 8.4* 8.1*  --  8.0*  MG 2.1 2.0 2.0 2.1  --   --  2.1  --   PHOS  --  3.3 2.8  --   --   --   --   --    GFR: Estimated Creatinine Clearance: 47 mL/min (A) (by C-G formula based on SCr of 1.33 mg/dL (H)). Liver Function Tests: Recent Labs  Lab 05/26/17 1226 05/27/17 0617 05/28/17 0445 05/29/17 0652  AST 23 32 21 29  ALT _1 ALKPHOS 61 56 54 60  BILITOT 0.9 0.9 0.9 0.9  PROT 5.8* 5.8* 5.4* 5.7*  ALBUMIN 2.9* 2.8* 2.5* 2.5*   No results for input(s): LIPASE, AMYLASE in the last 168  hours. No results for input(s): AMMONIA in the last 168 hours. Coagulation Profile: Recent Labs  Lab 05/26/17 1226  INR 1.65   Cardiac Enzymes: Recent Labs  Lab 05/26/17 1226 05/26/17 1812 05/26/17 2258 05/27/17 0617  TROPONINI 0.11* 0.13* 0.10* 0.10*   BNP (last 3 results) Recent Labs    04/18/17 1126 04/23/17 1048 05/06/17 1110  PROBNP 10,928* 10,848* 22,420*   HbA1C: No results for input(s): HGBA1C in the last 72 hours. CBG: Recent Labs  Lab 06/01/17 0736 06/01/17 1117 06/01/17 1607 06/01/17 2112 06/02/17 0743  GLUCAP 110* 159* 137* 118* 114*   Lipid Profile: No results for input(s): CHOL, HDL, LDLCALC, TRIG, CHOLHDL, LDLDIRECT in the last 72 hours. Thyroid Function Tests: No results for input(s): TSH, T4TOTAL, FREET4, T3FREE, THYROIDAB  in the last 72 hours. Anemia Panel: No results for input(s): VITAMINB12, FOLATE, FERRITIN, TIBC, IRON, RETICCTPCT in the last 72 hours. Urine analysis:    Component Value Date/Time   COLORURINE YELLOW 05/08/2017 [place] 05/08/2017 1353   LABSPEC 1.033 (H) 05/08/2017 1353   PHURINE 5.0 05/08/2017 1353   GLUCOSEU NEGATIVE 05/08/2017 1353   HGBUR NEGATIVE 05/08/2017 1353   BILIRUBINUR NEGATIVE 05/08/2017 1353   KETONESUR NEGATIVE 05/08/2017 1353   PROTEINUR NEGATIVE 05/08/2017 1353   UROBILINOGEN 0.2 03/08/2008 1519   NITRITE NEGATIVE 05/08/2017 1353   LEUKOCYTESUR NEGATIVE 05/08/2017 1353   Sepsis Labs: _0 (procalcitonin:4,lacticidven:4)  ) Recent Results (from the past 240 hour(s))  MRSA PCR Screening     Status: None   Collection Time: 05/27/17  8:08 AM  Result Value Ref Range Status   MRSA by PCR NEGATIVE NEGATIVE Final    Comment:        The GeneXpert MRSA Assay (FDA approved for NASAL specimens only), is one component of a comprehensive MRSA colonization surveillance program. It is not intended to diagnose MRSA infection nor to guide or monitor treatment for MRSA  infections. Performed at [facility], [place] [address], [place], [place] [address]   Gram stain     Status: None   Collection Time: 05/27/17  2:25 PM  Result Value Ref Range Status   Specimen Description FLUID RIGHT PLEURAL  Final   Special Requests   Final    NONE Performed at [facility], [place] [address], [place], [place] [address]    Gram Stain   Final    WBC PRESENT, PREDOMINANTLY PMN NO ORGANISMS SEEN CYTOSPIN SMEAR    Report Status 05/28/2017 FINAL  Final  Culture, body fluid-bottle     Status: None   Collection Time: 05/27/17  2:25 PM  Result Value Ref Range Status   Specimen Description FLUID RIGHT PLEURAL  Final   Special Requests BOTTLES DRAWN AEROBIC AND ANAEROBIC  Final   Culture   Final    NO GROWTH 5 DAYS Performed at [facility], [place] [address] [place], [place] [address]    Report Status 06/01/2017 FINAL  Final  Respiratory Panel by PCR     Status: None   Collection Time: 05/28/17 11:56 PM  Result Value Ref Range Status   Adenovirus NOT DETECTED NOT DETECTED Final   Coronavirus 229E NOT DETECTED NOT DETECTED Final   Coronavirus HKU1 NOT DETECTED NOT DETECTED Final   Coronavirus NL63 NOT DETECTED NOT DETECTED Final   Coronavirus OC43 NOT DETECTED NOT DETECTED Final   Metapneumovirus NOT DETECTED NOT DETECTED Final   Rhinovirus / Enterovirus NOT DETECTED NOT DETECTED Final   Influenza A NOT DETECTED NOT DETECTED Final   Influenza B NOT DETECTED NOT DETECTED Final   Parainfluenza Virus 1 NOT DETECTED NOT DETECTED Final   Parainfluenza Virus 2 NOT DETECTED NOT DETECTED Final   Parainfluenza Virus 3 NOT DETECTED NOT DETECTED Final   Parainfluenza Virus 4 NOT DETECTED NOT DETECTED Final   Respiratory Syncytial Virus NOT DETECTED NOT DETECTED Final   Bordetella pertussis NOT DETECTED NOT DETECTED Final   Chlamydophila pneumoniae NOT DETECTED NOT DETECTED Final   Mycoplasma pneumoniae NOT DETECTED NOT DETECTED Final    Comment: Performed at  [facility], [place] [address], [place], [place] [address]      Radiology Studies: Dg Chest 2 View  Result Date: 05/31/2017 CLINICAL DATA:  Shortness of breath. EXAM: CHEST  2  VIEW COMPARISON:  05/28/2017. FINDINGS: Cardiac pacer scratched it AICD noted in stable position. Prior CABG. Cardiomegaly. Bilateral basilar interstitial prominence and small bilateral pleural effusions consistent with CHF. Slight improvement from prior exam. IMPRESSION: AICD noted stable position. Cardiomegaly with bilateral from interstitial prominence and bilateral pleural effusions consistent with CHF. Slight improvement from prior exam. Electronically Signed   By: Marcello Moores  Register   On: 05/31/2017 14:03     Scheduled Meds: . apixaban  2.5 mg Oral BID  . carvedilol  6.25 mg Oral BID WC  . famotidine  20 mg Oral QHS  . feeding supplement (ENSURE ENLIVE)  237 mL Oral BID BM  . [START ON 06/03/2017] furosemide  40 mg Intravenous Daily  . hydrALAZINE  25 mg Oral TID  . isosorbide mononitrate  30 mg Oral QHS  . methimazole  10 mg Oral BID  . metoprolol tartrate  2.5 mg Intravenous Once  . mexiletine  250 mg Oral Q12H  . mirtazapine  15 mg Oral QHS  . pantoprazole  40 mg Oral QAC breakfast  . sodium chloride flush  3 mL Intravenous Q12H   Continuous Infusions: . sodium chloride       LOS: 7 days   Time Spent in minutes  30 minutes  Rucker Pridgeon D.O. on 06/02/2017 at 9:44 AM  Between 7am to 7pm - Pager - [phone]  After 7pm go to [email or web address] - password [email or web address]  And look for the night coverage person covering for me after hours  [facility] Office  [phone]

## 2017-06-02 NOTE — Progress Notes (Signed)
Patient is experiencing dry cough with discomfort PRNs given and adjusted to heat for decreased stuffiness.

## 2017-06-03 ENCOUNTER — Inpatient Hospital Stay (HOSPITAL_COMMUNITY): Payer: Medicare HMO

## 2017-06-03 LAB — BASIC METABOLIC PANEL
Anion gap: 10 (ref 5–15)
BUN: 21 mg/dL — ABNORMAL HIGH (ref 6–20)
CHLORIDE: 103 mmol/L (ref 101–111)
CO2: 24 mmol/L (ref 22–32)
Calcium: 8.3 mg/dL — ABNORMAL LOW (ref 8.9–10.3)
Creatinine, Ser: 1.31 mg/dL — ABNORMAL HIGH (ref 0.61–1.24)
GFR, EST AFRICAN AMERICAN: 57 mL/min — AB (ref >60)
GFR, EST NON AFRICAN AMERICAN: 49 mL/min — AB (ref >60)
Glucose, Bld: 104 mg/dL — ABNORMAL HIGH (ref 65–99)
POTASSIUM: 4 mmol/L (ref 3.5–5.1)
SODIUM: 137 mmol/L (ref 135–145)

## 2017-06-03 LAB — CBC
HCT: 40.6 % (ref 39.0–52.0)
HEMOGLOBIN: 12.8 g/dL — AB (ref 13.0–17.0)
MCH: 27.4 pg (ref 26.0–34.0)
MCHC: 31.5 g/dL (ref 30.0–36.0)
MCV: 86.9 fL (ref 78.0–100.0)
Platelets: 338 10*3/uL (ref 150–400)
RBC: 4.67 MIL/uL (ref 4.22–5.81)
RDW: 14.4 % (ref 11.5–15.5)
WBC: 9.9 10*3/uL (ref 4.0–10.5)

## 2017-06-03 LAB — GLUCOSE, CAPILLARY
GLUCOSE-CAPILLARY: 163 mg/dL — AB (ref 65–99)
GLUCOSE-CAPILLARY: 114 mg/dL — AB (ref 65–99)
Glucose-Capillary: 97 mg/dL (ref 65–99)
Glucose-Capillary: 131 mg/dL — ABNORMAL HIGH (ref 65–99)

## 2017-06-03 LAB — MAGNESIUM: MAGNESIUM: 2.2 mg/dL (ref 1.7–2.4)

## 2017-06-03 MED ORDER — FUROSEMIDE 40 MG PO TABS
40.0000 mg | ORAL_TABLET | Freq: Two times a day (BID) | ORAL | Status: DC
  Administered 2017-06-03 – 2017-06-04 (×3): 40 mg via ORAL
  Filled 2017-06-03 (×3): qty 1

## 2017-06-03 MED ORDER — ADULT MULTIVITAMIN W/MINERALS CH
1.0000 | ORAL_TABLET | Freq: Every day | ORAL | Status: DC
  Administered 2017-06-03 – 2017-06-04 (×2): 1 via ORAL
  Filled 2017-06-03 (×2): qty 1

## 2017-06-03 MED ORDER — CALCIUM CARBONATE ANTACID 500 MG PO CHEW
1.0000 | CHEWABLE_TABLET | Freq: Three times a day (TID) | ORAL | Status: DC | PRN
  Administered 2017-06-03 (×2): 200 mg via ORAL
  Filled 2017-06-03 (×2): qty 1

## 2017-06-03 MED ORDER — ENSURE ENLIVE PO LIQD
237.0000 mL | Freq: Two times a day (BID) | ORAL | Status: DC
  Administered 2017-06-03 – 2017-06-04 (×3): 237 mL via ORAL

## 2017-06-03 MED ORDER — PRO-STAT SUGAR FREE PO LIQD
30.0000 mL | Freq: Every day | ORAL | Status: DC
  Administered 2017-06-04: 30 mL via ORAL
  Filled 2017-06-03: qty 30

## 2017-06-03 MED FILL — Lidocaine HCl Local Inj 1%: INTRAMUSCULAR | Qty: 20 | Status: AC

## 2017-06-03 MED FILL — Heparin Sodium (Porcine) 2 Unit/ML in Sodium Chloride 0.9%: INTRAMUSCULAR | Qty: 1000 | Status: AC

## 2017-06-03 NOTE — Progress Notes (Signed)
Nutrition Follow-up  DOCUMENTATION CODES:   Severe malnutrition in context of chronic illness  INTERVENTION:   -Continue Ensure Enlive po BID, each supplement provides 350 kcal and 20 grams of protein -MVI daily -30 ml Prostat daily, each supplement provides 100 kcals and 15 grams protein  NUTRITION DIAGNOSIS:   Severe Malnutrition related to chronic illness(CHF, COPD) as evidenced by energy intake < 75% for > or equal to 1 month, moderate fat depletion, severe fat depletion, moderate muscle depletion, severe muscle depletion, percent weight loss.  Ongoing  GOAL:   Patient will meet greater than or equal to 90% of their needs  Progressing  MONITOR:   PO intake, Supplement acceptance, Labs, Weight trends, Skin, I & O's  REASON FOR ASSESSMENT:   Consult Assessment of nutrition requirement/status  ASSESSMENT:   [name] is an 84 y.o. male with past medical history significant for cardiomyopathy with an EF of 25% with ICD in place, atrial fibrillation, COPD, status post AVR, postural hypotension, DM who was discharged 15 days ago when he presented for for increasing weakness, weight loss and shortness of breath.  2/18- s/p thoracentesis (2 L removed) 2/21- s/p gastric emptying study; WDL 2/22- s/p rt and lt heart cath 2/24- MD added remeron for stimulation of appetite  Wt reviewed; pt has experienced a 1.6% wt loss x 1 week (noted pt -0.747 ml x 24 hous and -3.4 L since admission).   Per doc flowsheets, intake remains variable. Noted meal completion 35-100%.  Pt very somnolent at time of visit; did not awaken when RD called his name.   Case discussed with RN, who reports pt continues to complain of poor appetite and early satiety. In general, pt has been accepting Ensure supplements well (last 3 doses accepted per [facility]). Per RN, pt refused 1000 dose due to fear he would not be hungry for lunch if he consumed. Will adjust supplement schedule (offer q HS) to bromite  acceptance.   Labs reviewed: CBGS: 97-148.   Diet Order:  Diet Heart Room service appropriate? Yes; Fluid consistency: Thin  EDUCATION NEEDS:   Education needs have been addressed  Skin:  Skin Assessment: Reviewed RN Assessment  Last BM:  05/31/17  Height:   Ht Readings from Last 1 Encounters:  05/26/17 6' (1.829 m)    Weight:   Wt Readings from Last 1 Encounters:  06/03/17 181 lb 14.4 oz (82.5 kg)    Ideal Body Weight:  80.9 kg  BMI:  Body mass index is 24.67 kg/m.  Estimated Nutritional Needs:   Kcal:  2000-2200  Protein:  105-120 grams  Fluid:  2.0-2.2 L    Shalan Neault A. Jimmye Norman, RD, LDN, CDE Pager: [phone] After hours Pager: [phone]

## 2017-06-03 NOTE — Progress Notes (Addendum)
Patient is active with [facility] as prior to admission for HHRN,PT,OTAneta Mins [phone]

## 2017-06-03 NOTE — Progress Notes (Signed)
Pt requested TUMS. RN placed order.

## 2017-06-03 NOTE — Progress Notes (Signed)
PROGRESS NOTE    [name]  HFW:[id number] DOB: 12-17-1932 DOA: 05/26/2017 PCP: [name], MD   Chief Complaint  Patient presents with  . Shortness of Breath  . Weakness    Brief Narrative:  HPI On 05/26/2017 by Dr. Bonnell Public DARUIS [name] is an 84 y.o. male with past medical history significant for cardiomyopathy with an EF of 25% with ICD in place, atrial fibrillation, COPD, status post AVR, postural hypotension, DM who was discharged 15 days ago when he presented for for increasing weakness, weight loss and shortness of breath. Workup revealed hyperthyroidism which was thought to be secondary to amiodarone. Patient was started on methimazole and discharged home for follow-up with endocrinology.  Patient now presents stating that he did well for about 2 days after discharge and "it started getting bad again". Patient's main complaints are profound difficulty with ambulation. He describes a feeling of "feeling sick in my stomach" every time he tries to walk. He is also extremely short of breath with ambulation. Patient notes these symptoms have been present pretty much without change for the past 2 weeks however he came in today because his children forced him to come because he was unable to get off the couch because he was so weak. Patient does admit to some orthopnea which is also been going on for the past couple of weeks. Patient notes he is unable to walk across even a couple of steps due to discomfort in his abdominal area and shortness of breath.  Patient also complains of difficulty eating although it's unclear to me whether this is dysphagia, anorexia or gastric parous cyst. He complains nausea and a discomfort and abdominal area almost all the time but again it's worse with any exertion. Patient denies any fevers or chills but does admit to sweats during the day which have been present for several months. These are been attributed to hyperthyroidism. He also notes  a 20 pound weight loss over the past couple of weeks which he also mentioned on last admission last month. Patient also notes he's had several episodes of paroxysmal coughing such that he felt that he was cannot vomit. These have been occurring over the past several weeks. These are entirely nonproductive without hemoptysis or any phlegm production.  Interim history  Patient admitted for right pleural effusion, had thoracentesis on 05/27/2017.  Cardiology consulted and appreciated.  Patient also has sustained V. tach this morning approximately 7 minutes.  Patient also with significant nausea, CT abdomen and pelvis obtained.  GI consulted. Gastric emptying study unremarkable. Assessment & Plan   Ventricular tachycardia -Rapid response was called the morning of 05/29/2017 for VT, patient was instructed to perform vagal maneuvers, his tachycardia eventually broke. -patient has remained asymptomatic -potassium 4, magnesium 2.2 -cardiology possibly considering catheterization prior starting inotropic therapy (in PN on 2/19) -Continue Mexiletine 265m BID and coreg  Acute on chronic systolic/diastolic heart failure -Echocardiogram showed an EF of 202-77% grade 2 diastolic dysfunction -BNP on admission greater than 4500 -Cardiology consulted and appreciated -s/p heart catheterization showing three-vessel coronary disease with stable revascularization with patent LIMA-LAD, SVG-diagonal and SVG-RCA.  Moderate pH due to mixture of pulmonary venous and pulmonary atrial hypertension.  Normal cardiac output no evidence of thyrotoxicosis heart disease or pH.  Recommended to restart IV diuresis. -Monitor intake and output, daily weights -Continue coreg -Not on ACE inhibitor or ARB due to renal insufficiency -cardiology transitioning from IV lasix to oral form: 441mBID -UOP 1350 cc over past  24 hours; weight down 9lbs since admission  Elevated troponin -Troponin 0.13, 0.10, 0.10 -Cardiology consulted and  appreciated -Echocardiogram as above  Right pleural effusion -Noted on chest x-ray 05/26/2017 -Interventional radiology consulted and appreciated, Eliquis was held -Status post thoracentesis, 2 L drained -Fluid culture shows no growth, Gram stain showed no organisms -Repeat CXR on 2/22 reviewed and shows improvement in CHF, small B/L pleural effusions -as above, lasix increased, will continue to monitor -CXR 2/25 shows slight increase of pleural effusion -maintaining oxygen saturation in the high 90s on room air  Atrial fibrillation, paroxysmal -Continue Coreg -Currently rate controlled -Continue Eliquis  Abdominal discomfort -No longer complaining of abdominal pain, etiology unclear -CT abdomen pelvis showed moderate bilateral pleural effusions with adjacent subsegmental atelectasis.  Sigmoid diverticulosis without inflammation.  No acute abnormality seen in the abdomen or pelvis. -mexiletine can be associated with GI discomfort, initially held however restarted by cardiology for VT -?gastroparesis -GI consulted and appreciated, recommended gastric emptying study and possible trial of promethazine.  Patient may need erythromycin/Reglan however pending further recommendations from cardiology given these medications can prolong QT -gastric emptying study normal -currently on heart healthy diet and tolerating well  Hyperthyroidism -TSH less than 0.010, T3 4.2, free T4 4.01 -Continue methimazole -Patient follows with endocrinology, Dr. Cruzita Lederer   Coughing -Suspect secondary to pleural effusion -Respiratory viral panel negative -continue antitussives  Dyspnea -improved -Likely multifactorial including pleural effusion versus CHF versus atrial fibrillation -Patient also with history of COPD -Continue neb treatments as well as Lasix  Essential hypertension -Continue Coreg, hydralazine, Lasix   Chronic kidney disease, stage III -Creatinine 1.31 today, appears stable -continue  to monitor BMP  CAD -Status post CABG -Currently chest pain-free -continue coreg, imdur -Statin discontinued  GERD/history of Barrett's esophagus -Continue Protonix/pepcid  Generalized weakness and fatigue/failure to thrive -Patient has had weight loss and shortness of breath for quite some time -Possibly secondary to the above items -Will continue to monitor and treat accordingly -PT recommended SNF, social work consulted- patient refuses SNF -will likely discharge with home health services  Loss of appetite/Severe protein malnutrition -Continue Remeron -dietician consulted continue-feeding supplements -has been ongoing for months  DVT Prophylaxis  Eliquis  Code Status: Full  Family Communication: Daughter at bedside  Disposition Plan: Admitted.  Likely discharged home on 06/04/2017 with home health services  Consultants Cardiology Gastroenterology  Procedures  Echocardiogram  Antibiotics   Anti-infectives (From admission, onward)   None      Subjective:   Cindee Salt seen and examined today.  Feels breathing has improved. Currently denies chest pain,  abdominal pain, nausea vomiting, diarrhea or constipation.  Continues to complain of poor appetite and acid reflux type belching.  Feels that his strength has improved.  Objective:   Vitals:   06/02/17 1944 06/03/17 0421 06/03/17 0854 06/03/17 1227  BP: (!) 140/28 (!) 143/63 116/60 (!) 124/44  Pulse: 78 79  66  Resp: _0 Temp: 97.6 F (36.4 C) 97.8 F (36.6 C)  97.7 F (36.5 C)  TempSrc: Oral Oral  Oral  SpO2: 99% 95%  97%  Weight:  82.5 kg (181 lb 14.4 oz)    Height:        Intake/Output Summary (Last 24 hours) at 06/03/2017 1246 Last data filed at 06/03/2017 0700 Gross per 24 hour  Intake 360 ml  Output 900 ml  Net -540 ml   Filed Weights   06/01/17 0309 06/02/17 0534 06/03/17 0421  Weight: 84.5 kg (186 lb  3.2 oz) 83 kg (182 lb 14.4 oz) 82.5 kg (181 lb 14.4 oz)   Exam  General: Well  developed, chronically ill-appearing, no apparent distress  HEENT: NCAT, mucous membranes moist.   Neck: Supple, no JVD  Cardiovascular: S1 S2 auscultated, RRR, no murmur  Respiratory: Slightly diminished breath sounds however clear, no wheezing or crackles  Abdomen: Soft, nontender, nondistended, + bowel sounds  Extremities: warm dry without cyanosis clubbing or edema  Neuro: AAOx3, nonfocal  Skin: Without rashes exudates or nodules  Psych: appropriate mood and affect  Data Reviewed: I have personally reviewed following labs and imaging studies  CBC: Recent Labs  Lab 05/28/17 0445 05/29/17 6294 05/30/17 0457 05/31/17 0316 06/01/17 0531 06/02/17 0324 06/03/17 0622  WBC 11.8* 11.4* 10.0 9.9 9.8 9.4 9.9  NEUTROABS 7.2 6.6  --   --   --   --   --   HGB 12.7* 12.5* 11.8* 12.4* 11.8* 11.6* 12.8*  HCT 40.4 40.0 37.0* 38.7* 37.4* 36.6* 40.6  MCV 87.8 88.3 87.1 87.0 86.8 85.9 86.9  PLT 320 361 305 343 296 315 765   Basic Metabolic Panel: Recent Labs  Lab 05/28/17 0445 05/29/17 0652 05/30/17 0457 05/31/17 0316 06/01/17 0531 06/01/17 1159 06/02/17 0324 06/03/17 0622  NA 140 137 137 138 136  --  136 137  K 3.9 4.1 4.0 4.1 3.8  --  4.4 4.0  CL 105 101 104 103 103  --  102 103  CO2 _0 --  22 24  GLUCOSE 110* 119* 94 106* 103*  --  109* 104*  BUN 31* 28* 24* 19 17  --  21* 21*  CREATININE 1.37* 1.34* 1.22 1.26* 1.22  --  1.33* 1.31*  CALCIUM 8.2* 8.4* 8.0* 8.4* 8.1*  --  8.0* 8.3*  MG 2.0 2.0 2.1  --   --  2.1  --  2.2  PHOS 3.3 2.8  --   --   --   --   --   --    GFR: Estimated Creatinine Clearance: 47.7 mL/min (A) (by C-G formula based on SCr of 1.31 mg/dL (H)). Liver Function Tests: Recent Labs  Lab 05/28/17 0445 05/29/17 0652  AST 21 29  ALT 21 22  ALKPHOS 54 60  BILITOT 0.9 0.9  PROT 5.4* 5.7*  ALBUMIN 2.5* 2.5*   No results for input(s): LIPASE, AMYLASE in the last 168 hours. No results for input(s): AMMONIA in the last 168  hours. Coagulation Profile: No results for input(s): INR, PROTIME in the last 168 hours. Cardiac Enzymes: No results for input(s): CKTOTAL, CKMB, CKMBINDEX, TROPONINI in the last 168 hours. BNP (last 3 results) Recent Labs    04/18/17 1126 04/23/17 1048 05/06/17 1110  PROBNP 10,928* 10,848* 22,420*   HbA1C: No results for input(s): HGBA1C in the last 72 hours. CBG: Recent Labs  Lab 06/02/17 1125 06/02/17 1620 06/02/17 2114 06/03/17 0747 06/03/17 1134  GLUCAP 152* 115* 148* 97 163*   Lipid Profile: No results for input(s): CHOL, HDL, LDLCALC, TRIG, CHOLHDL, LDLDIRECT in the last 72 hours. Thyroid Function Tests: No results for input(s): TSH, T4TOTAL, FREET4, T3FREE, THYROIDAB in the last 72 hours. Anemia Panel: No results for input(s): VITAMINB12, FOLATE, FERRITIN, TIBC, IRON, RETICCTPCT in the last 72 hours. Urine analysis:    Component Value Date/Time   COLORURINE YELLOW 05/08/2017 [place] 05/08/2017 1353   LABSPEC 1.033 (H) 05/08/2017 1353   PHURINE 5.0 05/08/2017 1353   [place] 05/08/2017  [place] 05/08/2017 [place] 05/08/2017 1353   [place] 05/08/2017 1353   PROTEINUR NEGATIVE 05/08/2017 1353   UROBILINOGEN 0.2 03/08/2008 1519   NITRITE NEGATIVE 05/08/2017 1353   LEUKOCYTESUR NEGATIVE 05/08/2017 1353   Sepsis Labs: _0 (procalcitonin:4,lacticidven:4)  ) Recent Results (from the past 240 hour(s))  MRSA PCR Screening     Status: None   Collection Time: 05/27/17  8:08 AM  Result Value Ref Range Status   MRSA by PCR NEGATIVE NEGATIVE Final    Comment:        The GeneXpert MRSA Assay (FDA approved for NASAL specimens only), is one component of a comprehensive MRSA colonization surveillance program. It is not intended to diagnose MRSA infection nor to guide or monitor treatment for MRSA infections. Performed at [facility], [place] [address] [place], [place] [address]    Gram stain     Status: None   Collection Time: 05/27/17  2:25 PM  Result Value Ref Range Status   Specimen Description FLUID RIGHT PLEURAL  Final   Special Requests   Final    NONE Performed at [facility], [place] [address], [place], [place] [address]    Gram Stain   Final    WBC PRESENT, PREDOMINANTLY PMN NO ORGANISMS SEEN CYTOSPIN SMEAR    Report Status 05/28/2017 FINAL  Final  Culture, body fluid-bottle     Status: None   Collection Time: 05/27/17  2:25 PM  Result Value Ref Range Status   Specimen Description FLUID RIGHT PLEURAL  Final   Special Requests BOTTLES DRAWN AEROBIC AND ANAEROBIC  Final   Culture   Final    NO GROWTH 5 DAYS Performed at [facility], [place] [address] [place], [place] [address]    Report Status 06/01/2017 FINAL  Final  Respiratory Panel by PCR     Status: None   Collection Time: 05/28/17 11:56 PM  Result Value Ref Range Status   Adenovirus NOT DETECTED NOT DETECTED Final   Coronavirus 229E NOT DETECTED NOT DETECTED Final   Coronavirus HKU1 NOT DETECTED NOT DETECTED Final   Coronavirus NL63 NOT DETECTED NOT DETECTED Final   Coronavirus OC43 NOT DETECTED NOT DETECTED Final   Metapneumovirus NOT DETECTED NOT DETECTED Final   Rhinovirus / Enterovirus NOT DETECTED NOT DETECTED Final   Influenza A NOT DETECTED NOT DETECTED Final   Influenza B NOT DETECTED NOT DETECTED Final   Parainfluenza Virus 1 NOT DETECTED NOT DETECTED Final   Parainfluenza Virus 2 NOT DETECTED NOT DETECTED Final   Parainfluenza Virus 3 NOT DETECTED NOT DETECTED Final   Parainfluenza Virus 4 NOT DETECTED NOT DETECTED Final   Respiratory Syncytial Virus NOT DETECTED NOT DETECTED Final   Bordetella pertussis NOT DETECTED NOT DETECTED Final   Chlamydophila pneumoniae NOT DETECTED NOT DETECTED Final   Mycoplasma pneumoniae NOT DETECTED NOT DETECTED Final    Comment: Performed at [facility], [place] [address] [place], [place] [address]      Radiology Studies: Dg  Chest Port 1 View  Result Date: 06/03/2017 CLINICAL DATA:  Pleural effusion EXAM: PORTABLE CHEST 1 VIEW COMPARISON:  May 31, 2017 FINDINGS: There is a right pleural effusion with atelectatic change in the right base. There is mild atelectasis in left base medially. There is cardiomegaly with pulmonary vascularity within normal limits. There is aortic atherosclerosis. There is an aortic valve replacement. Pacemaker leads are attached to the right atrium, right ventricle, and coronary sinus.  Patient also status post coronary artery bypass grafting. IMPRESSION: Stable cardiomegaly. Right pleural effusion with right base atelectasis, slightly increased from recent study. Mild left base atelectasis. There is aortic atherosclerosis. Aortic Atherosclerosis (ICD10-I70.0). Electronically Signed   By: Lowella Grip III M.D.   On: 06/03/2017 07:38     Scheduled Meds: . apixaban  2.5 mg Oral BID  . carvedilol  6.25 mg Oral BID WC  . famotidine  20 mg Oral QHS  . feeding supplement (ENSURE ENLIVE)  237 mL Oral BID BM  . feeding supplement (PRO-STAT SUGAR FREE 64)  30 mL Oral Daily  . furosemide  40 mg Oral BID  . hydrALAZINE  25 mg Oral TID  . isosorbide mononitrate  30 mg Oral QHS  . methimazole  10 mg Oral BID  . metoprolol tartrate  2.5 mg Intravenous Once  . mexiletine  250 mg Oral Q12H  . mirtazapine  15 mg Oral QHS  . multivitamin with minerals  1 tablet Oral Daily  . pantoprazole  40 mg Oral QAC breakfast  . sodium chloride flush  3 mL Intravenous Q12H   Continuous Infusions: . sodium chloride       LOS: 8 days   Time Spent in minutes  30 minutes  Jeriann Sayres D.O. on 06/03/2017 at 12:46 PM  Between 7am to 7pm - Pager - [phone]  After 7pm go to [email or web address] - password [email or web address]  And look for the night coverage person covering for me after hours  [facility] Office  [phone]

## 2017-06-03 NOTE — Clinical Social Work Note (Signed)
CSW met with patient. Son at bedside. Patient still prefers HHPT rather than SNF. MD and RNCM notified.  CSW signing off. Consult again if any other social work needs arise.  [place]ton Scrape, [place]

## 2017-06-03 NOTE — Progress Notes (Signed)
Progress Note  Patient Name: [name] Date of Encounter: 06/03/2017  Primary Cardiologist: [name], MD   Subjective   Feels breathing at baseline,  slept well. Wonders if he can go home. Has not been OOB since PT saw him Saturday  Inpatient Medications    Scheduled Meds: . apixaban  2.5 mg Oral BID  . carvedilol  6.25 mg Oral BID WC  . famotidine  20 mg Oral QHS  . feeding supplement (ENSURE ENLIVE)  237 mL Oral BID BM  . furosemide  40 mg Intravenous Daily  . hydrALAZINE  25 mg Oral TID  . isosorbide mononitrate  30 mg Oral QHS  . methimazole  10 mg Oral BID  . metoprolol tartrate  2.5 mg Intravenous Once  . mexiletine  250 mg Oral Q12H  . mirtazapine  15 mg Oral QHS  . pantoprazole  40 mg Oral QAC breakfast  . sodium chloride flush  3 mL Intravenous Q12H   Continuous Infusions: . sodium chloride     PRN Meds: sodium chloride, acetaminophen, acetaminophen, benzonatate, ipratropium-albuterol, ondansetron **OR** ondansetron (ZOFRAN) IV, ondansetron (ZOFRAN) IV, oxyCODONE, promethazine, sodium chloride flush, traMADol   Vital Signs    Vitals:   06/02/17 1154 06/02/17 1805 06/02/17 1944 06/03/17 0421  BP: (!) 132/50 (!) 130/51 (!) 140/28 (!) 143/63  Pulse: 71 77 78 79  Resp: _0 Temp: (!) 97.5 F (36.4 C)  97.6 F (36.4 C) 97.8 F (36.6 C)  TempSrc: Oral  Oral Oral  SpO2: 97%  99% 95%  Weight:    181 lb 14.4 oz (82.5 kg)  Height:        Intake/Output Summary (Last 24 hours) at 06/03/2017 9390 Last data filed at 06/03/2017 0700 Gross per 24 hour  Intake 603 ml  Output 1350 ml  Net -747 ml   Filed Weights   06/01/17 0309 06/02/17 0534 06/03/17 0421  Weight: 186 lb 3.2 oz (84.5 kg) 182 lb 14.4 oz (83 kg) 181 lb 14.4 oz (82.5 kg)    Telemetry    V-paced, no ectopy in > 24 hr - Personally Reviewed  ECG    n/a - Personally Reviewed  Physical Exam   General: Well developed, well nourished, male in no acute distress Head: Eyes PERRLA,  No xanthomas.   Normocephalic and atraumatic  Lungs: decreased BS bases bilaterally, few rales Heart: HRRR S1 S2, 2/6 SEM, without RG.  Pulses are 2+ & equal. No carotid bruit. No JVD. Abdomen: Bowel sounds are present, abdomen soft and non-tender without masses or  hernias noted. Msk: Normal strength and tone for age. Extremities: No clubbing, cyanosis or edema.    Skin:  No rashes or lesions noted. Neuro: Alert and oriented X 3. Psych:  Good affect, responds appropriately   Labs    Chemistry Recent Labs  Lab 05/28/17 0445 05/29/17 3009  06/01/17 0531 06/02/17 0324 06/03/17 0622  NA 140 137   < > 136 136 137  K 3.9 4.1   < > 3.8 4.4 4.0  CL 105 101   < > 103 102 103  CO2 23 23   < > _1 GLUCOSE 110* 119*   < > 103* 109* 104*  BUN 31* 28*   < > 17 21* 21*  CREATININE 1.37* 1.34*   < > 1.22 1.33* 1.31*  CALCIUM 8.2* 8.4*   < > 8.1* 8.0* 8.3*  PROT 5.4* 5.7*  --   --   --   --  ALBUMIN 2.5* 2.5*  --   --   --   --   AST 21 29  --   --   --   --   ALT 21 22  --   --   --   --   ALKPHOS 54 60  --   --   --   --   BILITOT 0.9 0.9  --   --   --   --   GFRNONAA 46* 48*   < > 53* 48* 49*  GFRAA 54* 55*   < > >60 56* 57*  ANIONGAP 12 13   < > _0 < > = values in this interval not displayed.   Magnesium  Date Value Ref Range Status  06/03/2017 2.2 1.7 - 2.4 mg/dL Final    Comment:    Performed at [facility], [place] [address], [place], [place] [address]    Hematology Recent Labs  Lab 06/01/17 [phone] 06/02/17 0324 06/03/17 0622  WBC 9.8 9.4 9.9  RBC 4.31 4.26 4.67  HGB 11.8* 11.6* 12.8*  HCT 37.4* 36.6* 40.6  MCV 86.8 85.9 86.9  MCH 27.4 27.2 27.4  MCHC 31.6 31.7 31.5  RDW 14.5 14.3 14.4  PLT 296 315 338    Cardiac Enzymes Lab Results  Component Value Date   TROPONINI 0.10 (HH) 05/27/2017   BNP    Component Value Date/Time   BNP >4,500.0 (H) 05/26/2017 1226   ProBNP    Component Value Date/Time   PROBNP 22,420 (H) 05/06/2017 1110    Radiology    Dg Chest Port 1 View  Result Date: 06/03/2017 CLINICAL DATA:  Pleural effusion EXAM: PORTABLE CHEST 1 VIEW COMPARISON:  May 31, 2017 FINDINGS: There is a right pleural effusion with atelectatic change in the right base. There is mild atelectasis in left base medially. There is cardiomegaly with pulmonary vascularity within normal limits. There is aortic atherosclerosis. There is an aortic valve replacement. Pacemaker leads are attached to the right atrium, right ventricle, and coronary sinus. Patient also status post coronary artery bypass grafting. IMPRESSION: Stable cardiomegaly. Right pleural effusion with right base atelectasis, slightly increased from recent study. Mild left base atelectasis. There is aortic atherosclerosis. Aortic Atherosclerosis (ICD10-I70.0). Electronically Signed   By: Lowella Grip III M.D.   On: 06/03/2017 07:38    Cardiac Studies   Echo 05/27/17: Study Conclusions - Left ventricle: The cavity size was normal. Wall thickness was increased in a pattern of mild LVH. Systolic function was severely reduced. The estimated ejection fraction was in the range of 20% to 25%. There is akinesis of the inferolateral, inferior, and inferoseptal myocardium. Features are consistent with a pseudonormal left ventricular filling pattern, with concomitant abnormal relaxation and increased filling pressure (grade 2 diastolic dysfunction). - Aortic valve: A pericardial tissue valve bioprosthesis was present and functioning normally. Peak velocity (S): 226 cm/s. Mean gradient (S): 10 mm Hg. Valve area (VTI): 1.52 cm^2. Valve area (Vmax): 1.5 cm^2. Valve area (Vmean): 1.59 cm^2. - Mitral valve: Calcified annulus. Mildly thickened leaflets . There was mild regurgitation. - Left atrium: The atrium was severely dilated. - Right ventricle: Pacer wire or catheter noted in right ventricle. - Pulmonary arteries: Systolic pressure was mildly  increased. PA peak pressure: 35 mm Hg (S). Impressions: - Compared to the prior study, there has been no significant interval change.  Cath 05/31/17   Prox LAD to Mid LAD lesion is 50% stenosed.  Mid LAD to [facility]  LAD lesion is 25% stenosed.  1st Mrg lesion is 50% stenosed.  Prox RCA to Dist RCA lesion is 100% stenosed.  And is large.  Prox Graft to Mid Graft lesion is 30% stenosed.  And is large.  The flow in the graft is reversed.  There is competitive flow.  And is large.  There is competitive flow.  Ost 1st Diag to 1st Diag lesion is 90% stenosed.  Findings:  Ao = 145/67 (99)  RA = 6 RV = 70/9 PA = 69/30 (46) PCW = 26 v = 37 Fick cardiac output/index =4.7/2.3 PVR = 4.3 FA sat = 98% PA sat = 63%, 60%  Diagnostic Diagram       Patient Profile     84 y.o. male with multiple medica problems. History of AVR/CABG, ICM with CRTD. Recent hyperthyroidism on amiodarone. We were called to see for acute on chronic CHF and VT.  Assessment & Plan    1. Ventricular tachycardia: - none in > 24 hr  - on Coreg and Mexitil. K 4.0 today, Mg 2.2 today.  2. Acute on chronic systolic HF:  - I/O net neg 3.9L since admit, -747 ml 24 hr - think ok to change to po Lasix today (as planned in 02/24 note) - Coreg, hydralazine, and nitrates.  - BP has been elevated. Hydralazine increased to 25 mg tid. - no ACE/ARB/Entresto 2nd poor renal function - SBP 116 today, if it goes back up, would increase Coreg 6.25 mg>>12.5 mg bid  3. CAD:  - no ischemic sx - med rx w/ Coreg and Imdur - see how he tolerates increased activity - No longer on statin.  4. Bioprosthetic AVR: Functioning normally.  5. CRT-D: Functioning normally. NSVT, on Coreg and Mexitil.  6. PAF: On low dose Eliquis. Coreg for HR control.  7. Hypertension: BP is elevated. I will increase hydralazine to 25 mg tid.  8. Deconditioning: PT/OT have seen, rec SNF. MD advise if pt cleared from  Cards standpoint for this.  For questions or updates, please contact [place] Please consult [email or web address] for contact info under Cardiology/STEMI.    Signed, Rosaria Ferries, PA-C  06/03/2017, 8:52 AM

## 2017-06-03 NOTE — Progress Notes (Signed)
Physical Therapy Treatment Patient Details Name: [name] MRN: [id number] DOB: December 21, 1932 Today's Date: 06/03/2017    History of Present Illness Pt is an 84 y.o. male admitted 05/26/17 with c/o weakness, fatigue, and abdominal discomfort; worked up for CHF exacerbation. CXR showed pleural effusion. S/p thoracentesis 2/18. PMH includes AICD placement, CABG (2009), AVR, HTN, DM, CKD, arthritis. Of note, admitted 2 weeks ago with c/o weakness; was d/c home with HHPT services.    PT Comments    Pt performed gait training with rollator during session this afternoon.  Pt is now performing bed mobility with mod I level, and transfers/ gait with supervision.  Pt continues to benefit from safety with rollator use as he in new to this device. Based on progress in acute setting patient no longer requires SNF placement and he is safe to return home with support from his spouse.  Will also recommend rollator for use at home to aid in function and energy conservation.  Will inform supervising PT of need for change in recommendations at this time. Plan next session for curb training and rollator safety education.    Follow Up Recommendations  Home health PT;Supervision - Intermittent     Equipment Recommendations  Other (comment)(4 wheeled rolling walker with a seat ( rollator))    Recommendations for Other Services       Precautions / Restrictions Precautions Precautions: Fall Restrictions Weight Bearing Restrictions: No    Mobility  Bed Mobility Overal bed mobility: Modified Independent Bed Mobility: Supine to Sit;Sit to Supine     Supine to sit: Supervision;HOB elevated     General bed mobility comments: No assistance needed.    Transfers Overall transfer level: Needs assistance Equipment used: 4-wheeled walker Transfers: Sit to/from Stand Sit to Stand: Supervision         General transfer comment: Cues for safety to lock brakes pre transfers.  Cues for hand placement to  and from seated surface.   Ambulation/Gait Ambulation/Gait assistance: Supervision Ambulation Distance (Feet): 180 Feet Assistive device: 4-wheeled walker Gait Pattern/deviations: Step-through pattern;Shuffle;Trunk flexed Gait velocity: Decreased Gait velocity interpretation: Below normal speed for age/gender General Gait Details: Cues for forward gaze and upper trunk control.  Pt required cues to increased stride and height of step.  Increased WOB noted but SPO2 on RA 95%.     Stairs Stairs: (Pt reports he has a porch he has to step up onto.  )          Wheelchair Mobility    Modified Rankin (Stroke Patients Only)       Balance     Sitting balance-Leahy Scale: Fair       Standing balance-Leahy Scale: Fair Standing balance comment: with B UE support                            Cognition Arousal/Alertness: Awake/alert Behavior During Therapy: WFL for tasks assessed/performed Overall Cognitive Status: Within Functional Limits for tasks assessed                                        Exercises      General Comments        Pertinent Vitals/Pain Pain Assessment: No/denies pain    Home Living                      Prior  Function            PT Goals (current goals can now be found in the care plan section) Acute Rehab PT Goals Patient Stated Goal: Get stronger Potential to Achieve Goals: Good Progress towards PT goals: Progressing toward goals    Frequency    Min 2X/week      PT Plan Discharge plan needs to be updated    Co-evaluation              AM-PAC PT "6 Clicks" Daily Activity  Outcome Measure  Difficulty turning over in bed (including adjusting bedclothes, sheets and blankets)?: None Difficulty moving from lying on back to sitting on the side of the bed? : None Difficulty sitting down on and standing up from a chair with arms (e.g., wheelchair, bedside commode, etc,.)?: A Little Help needed  moving to and from a bed to chair (including a wheelchair)?: A Little Help needed walking in hospital room?: A Little Help needed climbing 3-5 steps with a railing? : A Little 6 Click Score: 20    End of Session Equipment Utilized During Treatment: Gait belt Activity Tolerance: Patient tolerated treatment well Patient left: with call bell/phone within reach;in bed(sitting edge of bed to eat dinner.) Nurse Communication: Mobility status PT Visit Diagnosis: Muscle weakness (generalized) (M62.81);Other symptoms and signs involving the nervous system (R29.898)     Time: 1644-1700 PT Time Calculation (min) (ACUTE ONLY): 16 min  Charges:  $Gait Training: 8-22 mins                    G Codes:       Governor Rooks, PTA pager [phone]    Cristela Blue 06/03/2017, 5:54 PM

## 2017-06-04 ENCOUNTER — Other Ambulatory Visit: Payer: Self-pay | Admitting: Physician Assistant

## 2017-06-04 DIAGNOSIS — E875 Hyperkalemia: Secondary | ICD-10-CM

## 2017-06-04 DIAGNOSIS — R0600 Dyspnea, unspecified: Secondary | ICD-10-CM

## 2017-06-04 LAB — POTASSIUM: Potassium: 4.4 mmol/L (ref 3.5–5.1)

## 2017-06-04 LAB — BASIC METABOLIC PANEL
ANION GAP: 12 (ref 5–15)
BUN: 26 mg/dL — ABNORMAL HIGH (ref 6–20)
CALCIUM: 8.2 mg/dL — AB (ref 8.9–10.3)
CO2: 23 mmol/L (ref 22–32)
CREATININE: 1.32 mg/dL — AB (ref 0.61–1.24)
Chloride: 105 mmol/L (ref 101–111)
GFR calc Af Amer: 56 mL/min — ABNORMAL LOW (ref >60)
GFR, EST NON AFRICAN AMERICAN: 49 mL/min — AB (ref >60)
GLUCOSE: 90 mg/dL (ref 65–99)
Potassium: 5.6 mmol/L — ABNORMAL HIGH (ref 3.5–5.1)
Sodium: 140 mmol/L (ref 135–145)

## 2017-06-04 LAB — GLUCOSE, CAPILLARY
GLUCOSE-CAPILLARY: 117 mg/dL — AB (ref 65–99)
Glucose-Capillary: 82 mg/dL (ref 65–99)

## 2017-06-04 MED ORDER — ENSURE ENLIVE PO LIQD: 237.0000 mL | Freq: Two times a day (BID) | ORAL | 0 refills | Status: DC

## 2017-06-04 MED ORDER — ISOSORBIDE MONONITRATE ER 30 MG PO TB24: 30.0000 mg | ORAL_TABLET | Freq: Every day | ORAL | 0 refills | Status: DC

## 2017-06-04 MED ORDER — SODIUM POLYSTYRENE SULFONATE 15 GM/60ML PO SUSP
15.0000 g | Freq: Once | ORAL | Status: DC
  Filled 2017-06-04: qty 60

## 2017-06-04 MED ORDER — BENZONATATE 200 MG PO CAPS: 200.0000 mg | ORAL_CAPSULE | Freq: Two times a day (BID) | ORAL | 0 refills | Status: DC | PRN

## 2017-06-04 MED ORDER — MIRTAZAPINE 15 MG PO TABS: 15.0000 mg | ORAL_TABLET | Freq: Every day | ORAL | 0 refills | Status: DC

## 2017-06-04 MED ORDER — ATORVASTATIN CALCIUM 20 MG PO TABS: 20.0000 mg | ORAL_TABLET | Freq: Every day | ORAL | Status: DC

## 2017-06-04 MED ORDER — PRO-STAT SUGAR FREE PO LIQD: 30.0000 mL | Freq: Every day | ORAL | 0 refills | Status: DC

## 2017-06-04 MED ORDER — HYDRALAZINE HCL 25 MG PO TABS: 25.0000 mg | ORAL_TABLET | Freq: Three times a day (TID) | ORAL | 0 refills | Status: DC

## 2017-06-04 MED ORDER — CARVEDILOL 12.5 MG PO TABS
12.5000 mg | ORAL_TABLET | Freq: Two times a day (BID) | ORAL | Status: DC
  Administered 2017-06-04: 12.5 mg via ORAL
  Filled 2017-06-04: qty 1

## 2017-06-04 MED ORDER — SODIUM POLYSTYRENE SULFONATE PO POWD
15.0000 g | Freq: Once | ORAL | Status: AC
  Administered 2017-06-04: 15 g via ORAL
  Filled 2017-06-04: qty 15

## 2017-06-04 MED ORDER — FUROSEMIDE 40 MG PO TABS: 40.0000 mg | ORAL_TABLET | Freq: Two times a day (BID) | ORAL | 0 refills | Status: DC

## 2017-06-04 NOTE — Discharge Summary (Signed)
Physician Discharge Summary  GAJE TENNYSON NOM:[id number] DOB: 1933-02-08 DOA: 05/26/2017  PCP: [name], MD  Admit date: 05/26/2017 Discharge date: 06/04/2017  Time spent: 45 minutes  Recommendations for Outpatient Follow-up:  Patient will be discharged to physical therapy, occupational therapy, nursing, and social work.  Patient will need to follow up with primary care provider within one week of discharge, repeat BMP.  Follow-up with cardiology, Dr. Radford Pax within 1 week.  Patient should continue medications as prescribed.  Patient should follow a heart healthy diet.   Discharge Diagnoses:  Ventricular tachycardia Acute on chronic systolic/diastolic heart failure Elevated troponin Right pleural effusion Atrial fibrillation, paroxysmal Abdominal discomfort Hyperthyroidism Coughing Dyspnea Essential hypertension Chronic kidney disease, stage III CAD GERD/history of Barrett's esophagus Generalized weakness and fatigue/failure to thrive Loss of appetite/Severe protein malnutrition  Discharge Condition: Stable  Diet recommendation: Heart healthy  Filed Weights   06/02/17 0534 06/03/17 0421 06/04/17 0612  Weight: 83 kg (182 lb 14.4 oz) 82.5 kg (181 lb 14.4 oz) 82.4 kg (181 lb 11.2 oz)    History of present illness:  On 05/26/2017 by Dr. Burnell Blanks Pearmanis an 84 y.o.malewith past medical history significant for cardiomyopathy with an EF of 25% with ICD in place, atrial fibrillation, COPD, status post AVR,postural hypotension, DMwho was discharged 15 days ago when he presented for for increasing weakness,weight loss and shortness of breath.Workup revealed hyperthyroidism which was thought to be secondary to amiodarone. Patient was started on methimazole and discharged home for follow-up with endocrinology.  Patient now presents stating that he did well for about 2 days after discharge and "it started getting bad again". Patient's main complaints are  profound difficulty with ambulation. He describes a feeling of "feeling sick in my stomach" every time he tries to walk. He is also extremely short of breath with ambulation. Patient notes these symptoms have been present pretty much without change for the past 2 weeks however he came in today because his children forced him to come because he was unable to get off the couch because he was so weak. Patient does admit to some orthopnea which is also been going on for the past couple of weeks. Patient notes he is unable to walk across even a couple of steps due to discomfort in his abdominal area and shortness of breath.  Patient also complains of difficulty eating although it's unclear to me whether this is dysphagia, anorexia or gastric parous cyst. He complains nausea and a discomfort and abdominal area almost all the time but again it's worse with any exertion. Patient denies any fevers or chills but does admit to sweats during the day which have been present for several months. These are been attributed to hyperthyroidism. He also notes a 20 pound weight loss over the past couple of weeks whichhe also mentioned on last admission last month. Patient also notes he's had several episodes of paroxysmal coughing such that he felt that he was cannot vomit. These have been occurring over the past several weeks. These are entirely nonproductive without hemoptysis or any phlegm production.  Hospital Course:  Ventricular tachycardia -Rapid response was called the morning of 05/29/2017 for VT, patient was instructed to perform vagal maneuvers, his tachycardia eventually broke. -patient has remained asymptomatic -potassium 4, magnesium 2.2 -cardiology possibly considering catheterization prior starting inotropic therapy (in PN on 2/19) -Continue Mexiletine 226m BID and coreg  Acute on chronic systolic/diastolic heart failure -Echocardiogram showed an EF of 296-28% grade 2 diastolic dysfunction -BNP on  admission greater than 4500 -Cardiology consulted and appreciated -s/p heart catheterization showing three-vessel coronary disease with stable revascularization with patent LIMA-LAD, SVG-diagonal and SVG-RCA.  Moderate pH due to mixture of pulmonary venous and pulmonary atrial hypertension.  Normal cardiac output no evidence of thyrotoxicosis heart disease or pH.  Recommended to restart IV diuresis. -Monitor intake and output, daily weights -Continue coreg (dose increased by cardiology) -Not on ACE inhibitor or ARB due to renal insufficiency -cardiology transitioning from IV lasix to oral form: 65m BID -UOP 1350 cc over past 24 hours; weight down 9lbs since admission  Elevated troponin -Troponin 0.13, 0.10, 0.10 -Cardiology consulted and appreciated -Echocardiogram as above  Right pleural effusion -Noted on chest x-ray 05/26/2017 -Interventional radiology consulted and appreciated, Eliquis was held -Status post thoracentesis, 2 L drained -Fluid culture shows no growth, Gram stain showed no organisms -Repeat CXR on 2/22 reviewed and shows improvement in CHF, small B/L pleural effusions -as above, lasix increased, will continue to monitor -CXR 2/25 shows slight increase of pleural effusion -maintaining oxygen saturation in the high 90s on room air  Atrial fibrillation, paroxysmal -Continue Coreg -Currently rate controlled -Continue Eliquis  Abdominal discomfort -No longer complaining of abdominal pain, etiology unclear -CT abdomen pelvis showed moderate bilateral pleural effusions with adjacent subsegmental atelectasis.  Sigmoid diverticulosis without inflammation.  No acute abnormality seen in the abdomen or pelvis. -mexiletine can be associated with GI discomfort, initially held however restarted by cardiology for VT -?gastroparesis -GI consulted and appreciated, recommended gastric emptying study and possible trial of promethazine.  Patient may need erythromycin/Reglan however  pending further recommendations from cardiology given these medications can prolong QT -gastric emptying study normal -currently on heart healthy diet and tolerating well  Hyperthyroidism -TSH less than 0.010, T3 4.2, free T4 4.01 -Continue methimazole -Patient follows with endocrinology, Dr. GCruzita Lederer  Hyperkalemia -Potassium this morning 5.6, given Kayexalate -Repeat potassium 4.4 -Repeat BMP as an outpatient in 2-3 days  Coughing -Suspect secondary to pleural effusion -Respiratory viral panel negative -continue antitussives  Dyspnea -improved -Likely multifactorial including pleural effusion versus CHF versus atrial fibrillation -Patient also with history of COPD -Continue neb treatments as well as Lasix  Essential hypertension -Continue Coreg, hydralazine, Lasix   Chronic kidney disease, stage III -Creatinine 1.32 today, appears stable  CAD -Status post CABG -Currently chest pain-free -continue coreg, imdur -Statin discontinued  GERD/history of Barrett's esophagus -Continue Protonix/pepcid  Generalized weakness and fatigue/failure to thrive -Patient has had weight loss and shortness of breath for quite some time -Possibly secondary to the above items -PT recommended SNF, social work consulted- patient refuses SNF -will discharge with home health services  Loss of appetite/Severe protein malnutrition -Continue Remeron -dietician consulted continue-feeding supplements -has been ongoing for months  Consultants Cardiology Gastroenterology  Procedures  Echocardiogram Gastric emptying study  Discharge Exam: Vitals:   06/04/17 0612 06/04/17 0925  BP: (!) 147/68 (!) 184/67  Pulse: 79   Resp: 16   Temp: (!) 97.5 F (36.4 C)   SpO2: 98%    Patient states his strength is improving.  Denies pain or shortness of breath.  Denies current chest pain, abdominal pain, nausea or vomiting, diarrhea or constipation dizziness or headache.  Still complains  of having poor appetite after breakfast.   General: Well developed, chronically ill-appearing, no apparent distress  HEENT: NCAT, mucous membranes moist.  Neck: Supple, no JVD  Cardiovascular: S1 S2 auscultated, RRR, no murmur  Respiratory: Clear to auscultation bilaterally, no wheezing  Abdomen: Soft, nontender, nondistended, +  bowel sounds  Extremities: warm dry without cyanosis clubbing or edema  Neuro: AAOx3, nonfocal  Psych: appropriate mood and affect  Discharge Instructions Discharge Instructions    AMB Referral to [facility]   Complete by:  As directed    Please assign patient to community care coordinator for Transition of Care needs, refusing SNF. Please assess for [employer] [facility] program, see Dietitian assessment today.  Wife at home but she is also receiving [facility] as well.  Patient with a HX with community nurse. Please assign to community.  Natividad Brood, RN BSN [facility] Liaison  [phone] business mobile phone Toll free office [phone]   Reason for consult:  Restart services for [facility] needs   Diagnoses of:  Heart Failure   Expected date of contact:  1-3 days (reserved for hospital discharges)   Discharge instructions   Complete by:  As directed    Patient will be discharged to physical therapy, occupational therapy, nursing, and social work.  Patient will need to follow up with primary care provider within one week of discharge, repeat BMP.  Follow-up with cardiology, Dr. Radford Pax within 1 week.  Patient should continue medications as prescribed.  Patient should follow a heart healthy diet.     Allergies as of 06/04/2017      Reactions   Novocain [procaine] Swelling      Medication List    TAKE these medications   acetaminophen 500 MG tablet Commonly known as:  TYLENOL Take 1,000 mg by mouth every 6 (six) hours as needed for headache (pain).   albuterol 108 (90 Base) MCG/ACT inhaler Commonly known as:   PROVENTIL HFA;VENTOLIN HFA Inhale 1-2 puffs into the lungs every 6 (six) hours as needed for wheezing or shortness of breath.   atorvastatin 20 MG tablet Commonly known as:  LIPITOR TAKE 1 TABLET (20 MG TOTAL) DAILY AT 6 PM.   benzonatate 200 MG capsule Commonly known as:  TESSALON Take 1 capsule (200 mg total) by mouth 2 (two) times daily as needed for cough.   carvedilol 12.5 MG tablet Commonly known as:  COREG Take 12.5 mg by mouth 2 (two) times daily.   ELIQUIS 2.5 MG Tabs tablet Generic drug:  apixaban TAKE 1 TABLET TWICE DAILY What changed:    how much to take  how to take this  when to take this   feeding supplement (ENSURE ENLIVE) Liqd Take 237 mLs by mouth 2 (two) times daily between meals.   feeding supplement (PRO-STAT SUGAR FREE 64) Liqd Take 30 mLs by mouth daily. Start taking on:  06/05/2017   furosemide 40 MG tablet Commonly known as:  LASIX Take 1 tablet (40 mg total) by mouth 2 (two) times daily. What changed:    medication strength  how much to take  when to take this   hydrALAZINE 25 MG tablet Commonly known as:  APRESOLINE Take 1 tablet (25 mg total) by mouth 3 (three) times daily. What changed:    how much to take  when to take this   isosorbide mononitrate 30 MG 24 hr tablet Commonly known as:  IMDUR Take 1 tablet (30 mg total) by mouth at bedtime. What changed:  how much to take   methimazole 10 MG tablet Commonly known as:  TAPAZOLE Take 1 tablet (10 mg total) by mouth 2 (two) times daily.   mexiletine 250 MG capsule Commonly known as:  MEXITIL Take 1 capsule (250 mg total) by mouth 2 (two) times daily.  mirtazapine 15 MG tablet Commonly known as:  REMERON Take 1 tablet (15 mg total) by mouth at bedtime.   multivitamin with minerals Tabs tablet Take 1 tablet by mouth daily.   omega-3 acid ethyl esters 1 g capsule Commonly known as:  LOVAZA Take 2 g by mouth 2 (two) times daily.   pantoprazole 40 MG tablet Commonly  known as:  PROTONIX Take 1 tablet (40 mg total) by mouth daily before breakfast.   predniSONE 10 MG tablet Commonly known as:  DELTASONE Take 2 tablets (20 mg total) by mouth daily with breakfast.   promethazine 25 MG tablet Commonly known as:  PHENERGAN Take 12.5-25 mg by mouth every 8 (eight) hours as needed for nausea or vomiting.   ranitidine 300 MG tablet Commonly known as:  ZANTAC Take 300 mg by mouth at bedtime.   traMADol 50 MG tablet Commonly known as:  ULTRAM Take 1 tablet (50 mg total) by mouth every 12 (twelve) hours as needed for moderate pain or severe pain.            Durable Medical Equipment  (From admission, onward)        Start     Ordered   06/04/17 1523  For home use only DME 4 wheeled rolling walker with seat  (Walkers)  Once    Question:  Patient needs a walker to treat with the following condition  Answer:  Physical deconditioning   06/04/17 1523   06/04/17 1143  For home use only DME Walker rolling  Once    Comments:  [place] - walker with seat  Question:  Patient needs a walker to treat with the following condition  Answer:  CHF (congestive heart failure) ([place])   06/04/17 1142   06/04/17 1027  For home use only DME Walker rolling  Once    Question:  Patient needs a walker to treat with the following condition  Answer:  CHF (congestive heart failure) ([place])   06/04/17 1027     Allergies  Allergen Reactions  . Novocain [Procaine] Swelling   Follow-up Information    [facility] Follow [facility].   Specialty:  [place] Why:  They will continue to do your home health care at your home Contact information: [address] [place] [address] [phone]        [name], MD. Schedule an appointment as soon as possible for a visit in 1 week(s).   Specialty:  Family Medicine Why:  Hosptial follow up Contact information: [place] [address] [phone]        Sueanne Margarita, MD Follow up on 06/14/2017.   Specialty:  Cardiology Why:  11:00AM. Contact information: [address] [place] [address] [phone]        [place] [facility] Follow up on 06/05/2017.   Specialty:  Cardiology Why:  obtain BMET at anytime between 8:30 until 4:30 on 06/05/2017. This is not a fasting lab, you can eat before the lab.  Contact information: [address], [address] [place]       Constance Haw, MD Follow up on 06/24/2017.   Specialty:  Cardiology Why:  1:45PM.  Contact information: [place] [address] [phone]            The results of significant diagnostics from this hospitalization (including imaging, microbiology, ancillary and laboratory) are listed  below for reference.    Significant Diagnostic Studies: Ct Abdomen Pelvis Wo Contrast  Result Date: 05/28/2017 CLINICAL DATA:  Nausea, vomiting. EXAM: CT ABDOMEN AND PELVIS WITHOUT CONTRAST TECHNIQUE: Multidetector CT imaging of the abdomen and pelvis was performed following the standard protocol without IV contrast. COMPARISON:  CT scan of May 09, 2017. FINDINGS: Lower chest: Moderate bilateral pleural effusions are noted with adjacent subsegmental atelectasis. Hepatobiliary: No focal liver abnormality is seen. No gallstones, gallbladder wall thickening, or biliary dilatation. Pancreas: Unremarkable. No pancreatic ductal dilatation or surrounding inflammatory changes. Spleen: Normal in size without focal abnormality. Adrenals/Urinary Tract: Adrenal glands are unremarkable. Kidneys are normal, without renal calculi, focal lesion, or hydronephrosis. Bladder is unremarkable. Stomach/Bowel: The stomach appears normal. Patient is status post appendectomy. There is no evidence of bowel obstruction or inflammation. Sigmoid diverticulosis is noted. Vascular/Lymphatic: Aortic atherosclerosis. No enlarged  abdominal or pelvic lymph nodes. Reproductive: Prostate is unremarkable. Other: No abdominal wall hernia or abnormality. No abdominopelvic ascites. Musculoskeletal: No acute or significant osseous findings. IMPRESSION: Moderate bilateral pleural effusions with adjacent subsegmental atelectasis. Sigmoid diverticulosis without inflammation. No acute abnormality seen in the abdomen or pelvis. Aortic Atherosclerosis (ICD10-I70.0). Electronically Signed   By: Marijo Conception, M.D.   On: 05/28/2017 14:06   Ct Abdomen Pelvis Wo Contrast  Result Date: 05/09/2017 CLINICAL DATA:  Weight loss unintentional with generalized abdominal pain. EXAM: CT ABDOMEN AND PELVIS WITHOUT CONTRAST TECHNIQUE: Multidetector CT imaging of the abdomen and pelvis was performed following the standard protocol without IV contrast. COMPARISON:  08/17/2013 and MRI 09/07/2013 FINDINGS: Lower chest: Mild emphysematous disease in the lung bases. Moderate size right effusion and smaller left effusion with associated bibasilar atelectasis. Cardiac pacer leads over the heart. Mild calcification of the mitral valve annulus. Hepatobiliary: Contrast within the gallbladder from recent chest CT. Liver and biliary tree are within normal. Pancreas: Normal. Spleen: Normal. Adrenals/Urinary Tract: Adrenal glands are normal. Kidneys are normal in size without hydronephrosis or nephrolithiasis. Ureters are within normal. Contrast fills the bladder. Stomach/Bowel: Stomach and small bowel are within normal. Appendix is normal. There is moderate diverticulosis of the colon most prominent over the sigmoid colon. No active inflammation. Vascular/Lymphatic: Moderate calcified plaque over the abdominal aorta and iliac arteries. No aneurysm. No adenopathy. Reproductive: Within normal. Other: No free fluid or focal inflammatory change. Musculoskeletal: Degenerate change of the spine and mild degenerate change of the hips. IMPRESSION: No acute findings in the  abdomen/pelvis. No evidence of mass or adenopathy. Moderate size right pleural effusion and small left pleural effusion with associated bibasilar atelectasis. Colonic diverticulosis. Aortic Atherosclerosis (ICD10-I70.0). Electronically Signed   By: Marin Olp M.D.   On: 05/09/2017 19:40   Dg Chest 1 View  Result Date: 05/27/2017 CLINICAL DATA:  Shortness of breath. Pleural effusions. Status post right thoracentesis. EXAM: CHEST 1 VIEW COMPARISON:  Chest x-ray and CT scan dated 05/26/2017 FINDINGS: There has been marked reduction in the size of the right pleural effusion. There is a small right residual effusion and a small left effusion. Cardiomegaly. CABG. AICD in place. Pulmonary vascularity is normal. Aortic atherosclerosis. IMPRESSION: No pneumothorax after right thoracentesis. Small residual right effusion.  Small left effusion is unchanged. Aortic Atherosclerosis (ICD10-I70.0). Electronically Signed   By: Lorriane Shire M.D.   On: 05/27/2017 14:43   Dg Chest 2 View  Result Date: 05/31/2017 CLINICAL DATA:  Shortness of breath. EXAM: CHEST  2 VIEW COMPARISON:  05/28/2017. FINDINGS: Cardiac pacer scratched it AICD noted in stable position. Prior CABG. Cardiomegaly.  Bilateral basilar interstitial prominence and small bilateral pleural effusions consistent with CHF. Slight improvement from prior exam. IMPRESSION: AICD noted stable position. Cardiomegaly with bilateral from interstitial prominence and bilateral pleural effusions consistent with CHF. Slight improvement from prior exam. Electronically Signed   By: Marcello Moores  Register   On: 05/31/2017 14:03   Dg Chest 2 View  Result Date: 05/08/2017 CLINICAL DATA:  Generalized weakness. Shortness of breath on exertion. Decreased P.O. intake for 3 weeks. EXAM: CHEST  2 VIEW COMPARISON:  CT chest 03/05/2017.  PA and lateral chest 02/25/2017. FINDINGS: There is cardiomegaly without edema. The patient is status post CABG with a pacing device in place. Small  bilateral pleural effusions and basilar airspace disease are greater on the left and increased slightly since the prior plain films. No pneumothorax. IMPRESSION: Small right greater than left pleural effusions and basilar airspace disease have increased since the prior plain films. Cardiomegaly without edema. Electronically Signed   By: Inge Rise M.D.   On: 05/08/2017 11:51   Ct Chest Wo Contrast  Addendum Date: 05/26/2017   ADDENDUM REPORT: 05/26/2017 19:42 ADDENDUM: High density sludge or stones in the gallbladder Electronically Signed   By: Donavan Foil M.D.   On: 05/26/2017 19:42   Result Date: 05/26/2017 CLINICAL DATA:  Persistent EXAM: CT CHEST WITHOUT CONTRAST TECHNIQUE: Multidetector CT imaging of the chest was performed following the standard protocol without IV contrast. COMPARISON:  Radiograph 05/26/2017, CT chest 05/08/2017, 03/05/2017, 05/08/2016 FINDINGS: Cardiovascular: Limited evaluation without intravenous contrast. Moderate-to-marked aortic atherosclerosis. No aneurysmal dilatation. Post CABG changes. Coronary artery calcification. Partially visualized cardiac pacing leads. Dense mitral calcification. Aortic valve replacement. Mild cardiomegaly. No significant pericardial effusion Mediastinum/Nodes: Midline trachea. No thyroid mass. Stable multiple slightly enlarged lymph nodes, for example AP window lymph node measures 15 mm. Low right paratracheal lymph node measures 1 cm. Esophagus within normal limits. Lungs/Pleura: Moderate to large right-sided pleural effusion with mild loculation along the pulmonary fissure. Small left-sided pleural effusion, both are increased since the prior CT. Mild emphysema. Progressive consolidation at the right lower lobe. Passive atelectasis in the left lower lobe. Scarring in the left apex. Small upper lobe lung nodules similar compared to most recent prior CT. Upper Abdomen: Diffuse increased density in the gallbladder. Musculoskeletal: Post  sternotomy changes. Degenerative changes of the spine. No acute or suspicious lesion. IMPRESSION: 1. Moderate large right pleural effusion and small left pleural effusion, both increased compared to prior CT. Progression in consolidation at the right lower lobe which may reflect worsening atelectasis or pneumonia 2. Stable mediastinal adenopathy, possibly reactive 3. Mild emphysema. 4. Small upper lobe lung nodules, see prior CT report 03/05/2017 and 05/08/2016 for follow-up recommendation. Aortic Atherosclerosis (ICD10-I70.0) and Emphysema (ICD10-J43.9). Electronically Signed: By: Donavan Foil M.D. On: 05/26/2017 18:54   Ct Angio Chest Pe W And/or Wo Contrast  Result Date: 05/08/2017 CLINICAL DATA:  Shortness of breath and generalized weakness. EXAM: CT ANGIOGRAPHY CHEST WITH CONTRAST TECHNIQUE: Multidetector CT imaging of the chest was performed using the standard protocol during bolus administration of intravenous contrast. Multiplanar CT image reconstructions and MIPs were obtained to evaluate the vascular anatomy. CONTRAST:  17m ISOVUE-370 IOPAMIDOL (ISOVUE-370) INJECTION 76% COMPARISON:  03/05/2017. FINDINGS: Cardiovascular: Negative for pulmonary embolus. Atherosclerotic calcification of the arterial vasculature. Pulmonary arteries and heart are enlarged. No pericardial effusion. Mediastinum/Nodes: Mediastinal and right hilar lymph nodes are not enlarged by CT size criteria. No axillary adenopathy. Esophagus is grossly unremarkable. Lungs/Pleura: Image quality is degraded by respiratory motion. Moderate right pleural  effusion with compressive atelectasis in the right lower lobe. Small left pleural effusion with minimal compressive atelectasis in the left lower lobe. 6 mm anterior left upper lobe nodule (series 6, image 69), appears new. Additional scattered pulmonary nodules measure 4 mm or less in size and are stable. Airway is unremarkable. Upper Abdomen: Visualized portions of the liver, gallbladder,  adrenal glands, kidneys, spleen, pancreas, stomach and bowel are grossly unremarkable. No upper abdominal adenopathy. Musculoskeletal: Degenerative changes in the spine. No worrisome lytic or sclerotic lesions. Review of the MIP images confirms the above findings. IMPRESSION: 1. Bilateral pleural effusions and associated compressive atelectasis, right greater than left. 2. 6 mm subpleural left upper lobe nodule appears new from 03/05/2017. Consider short-term follow-up in 3 months with CT chest without contrast, as clinically indicated. 3. Additional scattered pulmonary nodules are stable. 4.  Aortic atherosclerosis (ICD10-170.0). 5.  Emphysema (ICD10-J43.9). 6. Enlarged pulmonary arteries, indicative of pulmonary arterial hypertension. Electronically Signed   By: Lorin Picket M.D.   On: 05/08/2017 13:15   Nm Gastric Emptying  Result Date: 05/30/2017 CLINICAL DATA:  Severe nausea and dry heaves since the summer of 2019. EXAM: NUCLEAR MEDICINE GASTRIC EMPTYING SCAN TECHNIQUE: After oral ingestion of radiolabeled meal, sequential abdominal images were obtained for 4 hours. Percentage of activity emptying the stomach was calculated at 1 hour, 2 hour, 3 hour, and 4 hours. RADIOPHARMACEUTICALS:  1.8 mCi Tc-1msulfur colloid in standardized meal COMPARISON:  None. FINDINGS: Expected location of the stomach in the left upper quadrant. Ingested meal empties the stomach gradually over the course of the study. 38.7 emptied at 1 hr ( normal >= 10%) 78.6 emptied at 2 hr ( normal >= 40%) 88.7 emptied at 3 hr ( normal >= 70%) 93.2 emptied at 4 hr ( normal >= 90%) IMPRESSION: Normal gastric emptying study. Electronically Signed   By: TInge RiseM.D.   On: 05/30/2017 14:22   Dg Chest Port 1 View  Result Date: 06/03/2017 CLINICAL DATA:  Pleural effusion EXAM: PORTABLE CHEST 1 VIEW COMPARISON:  May 31, 2017 FINDINGS: There is a right pleural effusion with atelectatic change in the right base. There is mild  atelectasis in left base medially. There is cardiomegaly with pulmonary vascularity within normal limits. There is aortic atherosclerosis. There is an aortic valve replacement. Pacemaker leads are attached to the right atrium, right ventricle, and coronary sinus. Patient also status post coronary artery bypass grafting. IMPRESSION: Stable cardiomegaly. Right pleural effusion with right base atelectasis, slightly increased from recent study. Mild left base atelectasis. There is aortic atherosclerosis. Aortic Atherosclerosis (ICD10-I70.0). Electronically Signed   By: WLowella GripIII M.D.   On: 06/03/2017 07:38   Dg Chest Port 1 View  Result Date: 05/28/2017 CLINICAL DATA:  Shortness of Breath EXAM: PORTABLE CHEST 1 VIEW COMPARISON:  05/27/2017 FINDINGS: Prior CABG. Left AICD is unchanged. Cardiomegaly with vascular congestion. Layering effusions and bilateral lower lobe opacities with worsening aeration in the lung bases since prior study. IMPRESSION: Cardiomegaly with vascular congestion. Increasing layering effusions and bibasilar atelectasis or infiltrates. Electronically Signed   By: KRolm BaptiseM.D.   On: 05/28/2017 08:10   Dg Chest Port 1 View  Result Date: 05/26/2017 CLINICAL DATA:  Shortness of breath and weakness. EXAM: PORTABLE CHEST 1 VIEW COMPARISON:  CT chest and chest x-ray dated May 08, 2017. FINDINGS: Unchanged left chest wall AICD. Stable cardiomegaly. Normal pulmonary vascularity. Interval increase in size of now moderate right pleural effusion with adjacent right basilar airspace disease. The  left lung is clear. No pneumothorax. No acute osseous abnormality. IMPRESSION: 1. Interval increase in size of now moderate right pleural effusion with right basilar airspace disease, favored to reflect atelectasis. Electronically Signed   By: Titus Dubin M.D.   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 EXAM: THYROID ULTRASOUND TECHNIQUE: Ultrasound examination of the thyroid gland and adjacent soft tissues was performed. COMPARISON:  None. FINDINGS: Parenchymal Echotexture: Moderately heterogenous Isthmus: 0.6 cm Right lobe: 3.8 cm x 1.9 cm x 1.7 cm Left lobe: 4.4 cm x 2.0 cm x 1.9 cm _________________________________________________________ Estimated total number of nodules >/= 1 cm: 1 Number of spongiform nodules >/=  2 cm not described below (TR1): 0 Number of mixed cystic and solid nodules >/= 1.5 cm not described below ([place]): 0 _________________________________________________________ Nodule # 1: Location: Right; Inferior Maximum size: 0.8 cm; Other 2 dimensions: 0.5 cm x 0.8 cm Composition: spongiform (0) ACR TI-RADS recommendations: Spongiform nodule does not meet criteria for surveillance or biopsy _________________________________________________________ Nodule # 2: Location: Left; Superior Maximum size: 0.9 cm; Other 2 dimensions: 0.8 cm x 0.8 cm Composition: spongiform (0) ACR TI-RADS recommendations: Spongiform nodule does not meet criteria for surveillance or biopsy _________________________________________________________ Nodule # 3: Location: Left; Mid Maximum size: 0.9 cm; Other 2 dimensions: 0.7 cm x 0.8 cm Composition: spongiform (0) ACR TI-RADS recommendations: Spongiform nodule does not meet criteria for surveillance or biopsy _________________________________________________________ Nodule # 4: Location: Left; Inferior Maximum size: 1.4 cm; Other 2 dimensions: 41.1 cm x 1.1 cm Composition: cystic/almost completely cystic (0) Echogenicity: anechoic (0) Shape: not taller-than-wide (0) Margins: smooth (0) Echogenic foci: none (0) ACR TI-RADS total points: 0. ACR TI-RADS risk category: TR1 (0-1 points). ACR TI-RADS recommendations: Cystic nodule does not meet criteria for surveillance or biopsy _________________________________________________________ Color flow  demonstrates patchy regions of internal flow, not significantly increased within the left or right thyroid. No adenopathy. IMPRESSION: No thyroid nodule meets criteria for biopsy or surveillance, as designated by the newly established ACR TI-RADS criteria. Patchy regions of internal color flow of the thyroid parenchyma, not significantly increased. Recommendations follow those established by the new ACR TI-RADS criteria (J Am Coll Radiol 2947;65:465-035). Electronically Signed   By: Corrie Mckusick D.O.   On: 05/22/2017 13:07   Ir Thoracentesis Asp Pleural Space W/img Guide  Result Date: 05/27/2017 INDICATION: Patient with right pleural effusion. Request is made for diagnostic and therapeutic thoracentesis. EXAM: ULTRASOUND GUIDED DIAGNOSTIC AND THERAPEUTIC THORACENTESIS MEDICATIONS: 10 mL 2% lidocaine COMPLICATIONS: None immediate. PROCEDURE: An ultrasound guided thoracentesis was thoroughly discussed with the patient and questions answered. The benefits, risks, alternatives and complications were also discussed. The patient understands and wishes to proceed with the procedure. Written consent was obtained. Ultrasound was performed to localize and mark an adequate pocket of fluid in the right chest. The area was then prepped and draped in the normal sterile fashion. 2% Lidocaine was used for local anesthesia. Under ultrasound guidance a Safe-T-Centesis catheter was introduced. Thoracentesis was performed. The catheter was removed and a dressing applied. FINDINGS: A total of approximately 2.0 liters of fluid was removed. Samples were sent to the laboratory as requested by the clinical team. Procedure was stopped prior to removal of all fluid as this was patient's first thoracentesis. IMPRESSION: Successful ultrasound guided right thoracentesis yielding 2.0 liters of pleural fluid. Read by: Brynda Greathouse PA-C Electronically Signed   By: Eldridge Abrahams.D.  On: 05/27/2017 16:30    Microbiology: Recent Results  (from the past 240 hour(s))  MRSA PCR Screening     Status: None   Collection Time: 05/27/17  8:08 AM  Result Value Ref Range Status   MRSA by PCR NEGATIVE NEGATIVE Final    Comment:        The GeneXpert MRSA Assay (FDA approved for NASAL specimens only), is one component of a comprehensive MRSA colonization surveillance program. It is not intended to diagnose MRSA infection nor to guide or monitor treatment for MRSA infections. Performed at [facility], [place] [address], [place], [place] [address]   Gram stain     Status: None   Collection Time: 05/27/17  2:25 PM  Result Value Ref Range Status   Specimen Description FLUID RIGHT PLEURAL  Final   Special Requests   Final    NONE Performed at [facility], [place] [address] [place], [place] [address]    Gram Stain   Final    WBC PRESENT, PREDOMINANTLY PMN NO ORGANISMS SEEN CYTOSPIN SMEAR    Report Status 05/28/2017 FINAL  Final  Culture, body fluid-bottle     Status: None   Collection Time: 05/27/17  2:25 PM  Result Value Ref Range Status   Specimen Description FLUID RIGHT PLEURAL  Final   Special Requests BOTTLES DRAWN AEROBIC AND ANAEROBIC  Final   Culture   Final    NO GROWTH 5 DAYS Performed at [facility], [place] [address] [place], [place] [address]    Report Status 06/01/2017 FINAL  Final  Respiratory Panel by PCR     Status: None   Collection Time: 05/28/17 11:56 PM  Result Value Ref Range Status   Adenovirus NOT DETECTED NOT DETECTED Final   Coronavirus 229E NOT DETECTED NOT DETECTED Final   Coronavirus HKU1 NOT DETECTED NOT DETECTED Final   Coronavirus NL63 NOT DETECTED NOT DETECTED Final   Coronavirus OC43 NOT DETECTED NOT DETECTED Final   Metapneumovirus NOT DETECTED NOT DETECTED Final   Rhinovirus / Enterovirus NOT DETECTED NOT DETECTED Final   Influenza A NOT DETECTED NOT DETECTED Final   Influenza B NOT DETECTED NOT DETECTED Final   Parainfluenza Virus 1 NOT DETECTED NOT DETECTED Final     Parainfluenza Virus 2 NOT DETECTED NOT DETECTED Final   Parainfluenza Virus 3 NOT DETECTED NOT DETECTED Final   Parainfluenza Virus 4 NOT DETECTED NOT DETECTED Final   Respiratory Syncytial Virus NOT DETECTED NOT DETECTED Final   Bordetella pertussis NOT DETECTED NOT DETECTED Final   Chlamydophila pneumoniae NOT DETECTED NOT DETECTED Final   Mycoplasma pneumoniae NOT DETECTED NOT DETECTED Final    Comment: Performed at [facility], [place] [address], [place], [place] [address]     Labs: Basic Metabolic Panel: Recent Labs  Lab 05/29/17 [phone] 05/30/17 0457 05/31/17 0316 06/01/17 0531 06/01/17 1159 06/02/17 0324 06/03/17 0622 06/04/17 0434 06/04/17 1335  NA 137 137 138 136  --  136 137 140  --   K 4.1 4.0 4.1 3.8  --  4.4 4.0 5.6* 4.4  CL 101 104 103 103  --  102 103 105  --   CO2 _0 --  _1 --   GLUCOSE 119* 94 106* 103*  --  109* 104* 90  --   BUN 28* 24* 19 17  --  21* 21* 26*  --   CREATININE 1.34* 1.22 1.26* 1.22  --  1.33* 1.31* 1.32*  --   CALCIUM 8.4* 8.0* 8.4* 8.1*  --  8.0* 8.3* 8.2*  --   MG 2.0 2.1  --   --  2.1  --  2.2  --   --   PHOS 2.8  --   --   --   --   --   --   --   --    Liver Function Tests: Recent Labs  Lab 05/29/17 0652  AST 29  ALT 22  ALKPHOS 60  BILITOT 0.9  PROT 5.7*  ALBUMIN 2.5*   No results for input(s): LIPASE, AMYLASE in the last 168 hours. No results for input(s): AMMONIA in the last 168 hours. CBC: Recent Labs  Lab 05/29/17 0652 05/30/17 0457 05/31/17 0316 06/01/17 0531 06/02/17 0324 06/03/17 0622  WBC 11.4* 10.0 9.9 9.8 9.4 9.9  NEUTROABS 6.6  --   --   --   --   --   HGB 12.5* 11.8* 12.4* 11.8* 11.6* 12.8*  HCT 40.0 37.0* 38.7* 37.4* 36.6* 40.6  MCV 88.3 87.1 87.0 86.8 85.9 86.9  PLT 361 305 343 296 315 338   Cardiac Enzymes: No results for input(s): CKTOTAL, CKMB, CKMBINDEX, TROPONINI in the last 168 hours. BNP: BNP (last 3 results) Recent Labs    05/26/17 1226  BNP >4,500.0*    ProBNP  (last 3 results) Recent Labs    04/18/17 1126 04/23/17 1048 05/06/17 1110  PROBNP 10,928* 10,848* 22,420*    CBG: Recent Labs  Lab 06/03/17 1134 06/03/17 1654 06/03/17 2205 06/04/17 0745 06/04/17 1208  GLUCAP 163* 131* 114* 117* 82       Signed:  Daishaun Ayre  [facility] 06/04/2017, 3:23 PM

## 2017-06-04 NOTE — Progress Notes (Signed)
Physical Therapy Treatment Patient Details Name: [name] MRN: [id number] DOB: [birth year] Today's Date: 06/04/2017    History of Present Illness Pt is an 84 y.o. male admitted 05/26/17 with c/o weakness, fatigue, and abdominal discomfort; worked up for CHF exacerbation. CXR showed pleural effusion. S/p thoracentesis 2/18. PMH includes AICD placement, CABG (2009), AVR, HTN, DM, CKD, arthritis. Of note, admitted 2 weeks ago with c/o weakness; was d/c home with HHPT services.    PT Comments    Patient is making progress toward mobility goals. This session focused on safety with rollator use and stair training. Current plan remains appropriate.    Follow Up Recommendations  Home health PT;Supervision - Intermittent     Equipment Recommendations  Other (comment)(4 wheeled rolling walker with a seat ( rollator))    Recommendations for Other Services OT consult     Precautions / Restrictions Precautions Precautions: Fall Restrictions Weight Bearing Restrictions: No    Mobility  Bed Mobility               General bed mobility comments: pt sitting EOB upon arrival  Transfers Overall transfer level: Needs assistance Equipment used: (rollator) Transfers: Sit to/from Stand Sit to Stand: Modified independent (Device/Increase time) Stand pivot transfers: Supervision       General transfer comment: cues for safety when sitting on rollator seat including stabilizing against a wall if possible and locking brakes; mod I to stand from EOB   Ambulation/Gait Ambulation/Gait assistance: Modified independent (Device/Increase time) Ambulation Distance (Feet): 225 Feet Assistive device: (rollator) Gait Pattern/deviations: Step-through pattern;Trunk flexed;Decreased stride length Gait velocity: Decreased   General Gait Details: cues for posture and forward gaze as well as to use brakes when going down ramped entrance at home; slight SOB but rest break not necessary    Stairs      Stair Management: Step to pattern;No rails;Backwards;With walker Number of Stairs: 1 General stair comments: cues for safety with use of AD   Wheelchair Mobility    Modified Rankin (Stroke Patients Only)       Balance Overall balance assessment: Needs assistance Sitting-balance support: No upper extremity supported;Feet supported Sitting balance-Leahy Scale: Good     Standing balance support: No upper extremity supported;Bilateral upper extremity supported;During functional activity Standing balance-Leahy Scale: Fair Standing balance comment: pt is able to static stand without UE support                            Cognition Arousal/Alertness: Awake/alert Behavior During Therapy: WFL for tasks assessed/performed Overall Cognitive Status: Within Functional Limits for tasks assessed                                        Exercises      General Comments        Pertinent Vitals/Pain Pain Assessment: Faces Faces Pain Scale: No hurt    Home Living                      Prior Function            PT Goals (current goals can now be found in the care plan section) Acute Rehab PT Goals PT Goal Formulation: With patient/family Time For Goal Achievement: 06/11/17 Potential to Achieve Goals: Good Progress towards PT goals: Progressing toward goals    Frequency    Min 2X/week  PT Plan Current plan remains appropriate    Co-evaluation              AM-PAC PT "6 Clicks" Daily Activity  Outcome Measure  Difficulty turning over in bed (including adjusting bedclothes, sheets and blankets)?: None Difficulty moving from lying on back to sitting on the side of the bed? : None Difficulty sitting down on and standing up from a chair with arms (e.g., wheelchair, bedside commode, etc,.)?: A Little Help needed moving to and from a bed to chair (including a wheelchair)?: A Little Help needed walking in hospital room?: A  Little Help needed climbing 3-5 steps with a railing? : A Little 6 Click Score: 20    End of Session Equipment Utilized During Treatment: Gait belt Activity Tolerance: Patient tolerated treatment well Patient left: with call bell/phone within reach;in bed(sitting EOB) Nurse Communication: Mobility status PT Visit Diagnosis: Muscle weakness (generalized) (M62.81);Other symptoms and signs involving the nervous system (R29.898)     Time: 5072-2575 PT Time Calculation (min) (ACUTE ONLY): 14 min  Charges:  $Gait Training: 8-22 mins                    G Codes:       Earney Navy, PTA Pager: [phone]     Darliss Cheney 06/04/2017, 3:51 PM

## 2017-06-04 NOTE — Progress Notes (Signed)
Rolling Walker with seat/ Rollater ordered as requestedAneta Mins [phone]

## 2017-06-04 NOTE — Discharge Instructions (Signed)
Heart Failure Heart failure means your heart has trouble pumping blood. This makes it hard for your body to work well. Heart failure is usually a long-term (chronic) condition. You must take good care of yourself and follow your doctor's treatment plan. Follow these instructions at home:  Take your heart medicine as told by your doctor. ? Do not stop taking medicine unless your doctor tells you to. ? Do not skip any dose of medicine. ? Refill your medicines before they run out. ? Take other medicines only as told by your doctor or pharmacist.  Stay active if told by your doctor. The elderly and people with severe heart failure should talk with a doctor about physical activity.  Eat heart-healthy foods. Choose foods that are without trans fat and are low in saturated fat, cholesterol, and salt (sodium). This includes fresh or frozen fruits and vegetables, fish, lean meats, fat-free or low-fat dairy foods, whole grains, and high-fiber foods. Lentils and dried peas and beans (legumes) are also good choices.  Limit salt if told by your doctor.  Cook in a healthy way. Roast, grill, broil, bake, poach, steam, or stir-fry foods.  Limit fluids as told by your doctor.  Weigh yourself every morning. Do this after you pee (urinate) and before you eat breakfast. Write down your weight to give to your doctor.  Take your blood pressure and write it down if your doctor tells you to.  Ask your doctor how to check your pulse. Check your pulse as told.  Lose weight if told by your doctor.  Stop smoking or chewing tobacco. Do not use gum or patches that help you quit without your doctor's approval.  Schedule and go to doctor visits as told.  Nonpregnant women should have no more than 1 drink a day. Men should have no more than 2 drinks a day. Talk to your doctor about drinking alcohol.  Stop illegal drug use.  Stay current with shots (immunizations).  Manage your health conditions as told by your  doctor.  Learn to manage your stress.  Rest when you are tired.  If it is really hot outside: ? Avoid intense activities. ? Use air conditioning or fans, or get in a cooler place. ? Avoid caffeine and alcohol. ? Wear loose-fitting, lightweight, and light-colored clothing.  If it is really cold outside: ? Avoid intense activities. ? Layer your clothing. ? Wear mittens or gloves, a hat, and a scarf when going outside. ? Avoid alcohol.  Learn about heart failure and get support as needed.  Get help to maintain or improve your quality of life and your ability to care for yourself as needed. Contact a doctor if:  You gain weight quickly.  You are more short of breath than usual.  You cannot do your normal activities.  You tire easily.  You cough more than normal, especially with activity.  You have any or more puffiness (swelling) in areas such as your hands, feet, ankles, or belly (abdomen).  You cannot sleep because it is hard to breathe.  You feel like your heart is beating fast (palpitations).  You get dizzy or light-headed when you stand up. Get help right away if:  You have trouble breathing.  There is a change in mental status, such as becoming less alert or not being able to focus.  You have chest pain or discomfort.  You faint. This information is not intended to replace advice given to you by your health care provider. Make sure you   discuss any questions you have with your health care provider. Document Released: 01/03/2008 Document Revised: 09/01/2015 Document Reviewed: 05/12/2012 Elsevier Interactive Patient Education  2017 [employer].

## 2017-06-04 NOTE — Progress Notes (Signed)
RN notified pt's son Lanny Hurst) of pt status per pt request.

## 2017-06-04 NOTE — Progress Notes (Signed)
Progress Note  Patient Name: [name] Date of Encounter: 06/04/2017  Primary Cardiologist: [name], MD   Subjective   Denies any SOB or CP. Ambulated without significant issue.   Inpatient Medications    Scheduled Meds: . apixaban  2.5 mg Oral BID  . carvedilol  6.25 mg Oral BID WC  . famotidine  20 mg Oral QHS  . feeding supplement (ENSURE ENLIVE)  237 mL Oral BID BM  . feeding supplement (PRO-STAT SUGAR FREE 64)  30 mL Oral Daily  . furosemide  40 mg Oral BID  . hydrALAZINE  25 mg Oral TID  . isosorbide mononitrate  30 mg Oral QHS  . methimazole  10 mg Oral BID  . metoprolol tartrate  2.5 mg Intravenous Once  . mexiletine  250 mg Oral Q12H  . mirtazapine  15 mg Oral QHS  . multivitamin with minerals  1 tablet Oral Daily  . pantoprazole  40 mg Oral QAC breakfast  . sodium chloride flush  3 mL Intravenous Q12H  . sodium polystyrene  15 g Oral Once   Continuous Infusions: . sodium chloride     PRN Meds: sodium chloride, acetaminophen, acetaminophen, benzonatate, calcium carbonate, ipratropium-albuterol, ondansetron **OR** ondansetron (ZOFRAN) IV, ondansetron (ZOFRAN) IV, oxyCODONE, promethazine, sodium chloride flush, traMADol   Vital Signs    Vitals:   06/03/17 1227 06/03/17 1603 06/03/17 2146 06/04/17 0612  BP: (!) 124/44 (!) 155/58 (!) 128/30 (!) 147/68  Pulse: 66  74 79  Resp: _0 Temp: 97.7 F (36.5 C)  98.1 F (36.7 C) (!) 97.5 F (36.4 C)  TempSrc: Oral  Oral Oral  SpO2: 97%  96% 98%  Weight:    181 lb 11.2 oz (82.4 kg)  Height:        Intake/Output Summary (Last 24 hours) at 06/04/2017 0821 Last data filed at 06/04/2017 9038 Gross per 24 hour  Intake 477 ml  Output 800 ml  Net -323 ml   Filed Weights   06/02/17 0534 06/03/17 0421 06/04/17 0612  Weight: 182 lb 14.4 oz (83 kg) 181 lb 14.4 oz (82.5 kg) 181 lb 11.2 oz (82.4 kg)    Telemetry    ventricularly paced rhythm - Personally Reviewed  ECG    ventricularly paced  rhythm - Personally Reviewed  Physical Exam   GEN: No acute distress.   Neck: No JVD Cardiac: RRR, no murmurs, rubs, or gallops.  Respiratory: Clear to auscultation bilaterally. GI: Soft, nontender, non-distended  MS: No edema; No deformity. Neuro:  Nonfocal  Psych: Normal affect   Labs    Chemistry Recent Labs  Lab 05/29/17 3338  06/02/17 0324 06/03/17 0622 06/04/17 0434  NA 137   < > 136 137 140  K 4.1   < > 4.4 4.0 5.6*  CL 101   < > 102 103 105  CO2 23   < > _1 GLUCOSE 119*   < > 109* 104* 90  BUN 28*   < > 21* 21* 26*  CREATININE 1.34*   < > 1.33* 1.31* 1.32*  CALCIUM 8.4*   < > 8.0* 8.3* 8.2*  PROT 5.7*  --   --   --   --   ALBUMIN 2.5*  --   --   --   --   AST 29  --   --   --   --   ALT 22  --   --   --   --  ALKPHOS 60  --   --   --   --   BILITOT 0.9  --   --   --   --   GFRNONAA 48*   < > 48* 49* 49*  GFRAA 55*   < > 56* 57* 56*  ANIONGAP 13   < > _0 < > = values in this interval not displayed.     Hematology Recent Labs  Lab 06/01/17 0531 06/02/17 0324 06/03/17 0622  WBC 9.8 9.4 9.9  RBC 4.31 4.26 4.67  HGB 11.8* 11.6* 12.8*  HCT 37.4* 36.6* 40.6  MCV 86.8 85.9 86.9  MCH 27.4 27.2 27.4  MCHC 31.6 31.7 31.5  RDW 14.5 14.3 14.4  PLT 296 315 338    Cardiac EnzymesNo results for input(s): TROPONINI in the last 168 hours. No results for input(s): TROPIPOC in the last 168 hours.   BNPNo results for input(s): BNP, PROBNP in the last 168 hours.   DDimer No results for input(s): DDIMER in the last 168 hours.   Radiology    Dg Chest Port 1 View  Result Date: 06/03/2017 CLINICAL DATA:  Pleural effusion EXAM: PORTABLE CHEST 1 VIEW COMPARISON:  May 31, 2017 FINDINGS: There is a right pleural effusion with atelectatic change in the right base. There is mild atelectasis in left base medially. There is cardiomegaly with pulmonary vascularity within normal limits. There is aortic atherosclerosis. There is an aortic valve  replacement. Pacemaker leads are attached to the right atrium, right ventricle, and coronary sinus. Patient also status post coronary artery bypass grafting. IMPRESSION: Stable cardiomegaly. Right pleural effusion with right base atelectasis, slightly increased from recent study. Mild left base atelectasis. There is aortic atherosclerosis. Aortic Atherosclerosis (ICD10-I70.0). Electronically Signed   By: Lowella Grip III M.D.   On: 06/03/2017 07:38    Cardiac Studies   Echo 05/27/2017 LV EF: 20% -   25%  Study Conclusions  - Left ventricle: The cavity size was normal. Wall thickness was   increased in a pattern of mild LVH. Systolic function was   severely reduced. The estimated ejection fraction was in the   range of 20% to 25%. There is akinesis of the inferolateral,   inferior, and inferoseptal myocardium. Features are consistent   with a pseudonormal left ventricular filling pattern, with   concomitant abnormal relaxation and increased filling pressure   (grade 2 diastolic dysfunction). - Aortic valve: A pericardial tissue valve bioprosthesis was   present and functioning normally. Peak velocity (S): 226 cm/s.   Mean gradient (S): 10 mm Hg. Valve area (VTI): 1.52 cm^2. Valve   area (Vmax): 1.5 cm^2. Valve area (Vmean): 1.59 cm^2. - Mitral valve: Calcified annulus. Mildly thickened leaflets .   There was mild regurgitation. - Left atrium: The atrium was severely dilated. - Right ventricle: Pacer wire or catheter noted in right ventricle. - Pulmonary arteries: Systolic pressure was mildly increased. PA   peak pressure: 35 mm Hg (S).  Impressions:  - Compared to the prior study, there has been no significant   interval change.      Cath 05/31/2017 Conclusion     Prox LAD to Mid LAD lesion is 50% stenosed.  Mid LAD to Dist LAD lesion is 25% stenosed.  1st Mrg lesion is 50% stenosed.  Prox RCA to Dist RCA lesion is 100% stenosed.  And is large.  Prox Graft  to Mid Graft lesion is 30% stenosed.  And is large.  The flow in  the graft is reversed.  There is competitive flow.  And is large.  There is competitive flow.  Ost 1st Diag to 1st Diag lesion is 90% stenosed.   Findings:  Ao = 145/67 (99)  RA = 6 RV = 70/9 PA = 69/30 (46) PCW = 26 v = 37 Fick cardiac output/index =4.7/2.3 PVR = 4.3 FA sat = 98% PA sat = 63%, 60%  Assessment:  1. 3v CAD with stable revascularization with patent LIMA-LAD, SVG-diagonal and SVG-RCA 2. 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 Normal cardiac output - no evidence of thyrotoxicosis heart disease or PH      Patient Profile     84 y.o. male with multiple medica problems. History of AVR/CABG, ICM with CRTD. Recent hyperthyroidism on amiodarone. Recently underwent thoracentesis for R pleural effusion 05/27/2017. We were called to see for acute on chronic CHF and VT. Underwent L&RHC on 05/31/2017, no culprit lesion found.   Assessment & Plan    1. Ventricular tachycardia: continue coreg and mexiletine, no significant ventricular ectopy overnight. BP elevated, will increase coreg to 12.26m BID. Likely stable for discharge from cardiac perspective with close outpatient monitoring of K level, see reason below.   2. Acute on chronic systolic HF: on coreg, imdur/hydralazine. Renal function stable. Recent RHC showed moderate pulmonary venous and pulmonary arterial HTN.   3. CAD: recent cath 05/31/2017 3v CAD with patent LIMA to LAD, SVG to diag, and SVG to RCA.  4. Bioprosthetic AVR  5. CRT-D: functioning normally  6. PAF on eliquis: currently ventricularly paced rhythm  7. HTN: elevated, increase coreg to 12.566mBID  8. Hyperkalemia: primary service ordered a dose of 15 g Kayexalate this morning. Need repeat BMET in 2 days as the rise in K was unsual (yesterday 4.0, today 5.6, last dose of KCl was 3 days ago, not on ACEI or ARB), question  hemolysis.   9. R pleural effusion: s/p thoracentesis 05/27/2017  10. Deconditioning: Family prefer HHPT instead of SNF   For questions or updates, please contact [facility] Please consult [email or web address] for contact info under Cardiology/STEMI.      SiHilbert CorriganPA  06/04/2017, 8:21 AM

## 2017-06-04 NOTE — Plan of Care (Signed)
  Adequate for Discharge Education: Ability to demonstrate management of disease process will improve 06/04/2017 1124 - Adequate for Discharge by Ottie Glazier, RN Clinical Measurements: Ability to maintain clinical measurements within normal limits will improve 06/04/2017 1124 - Adequate for Discharge by Ottie Glazier, RN Coping: Level of anxiety will decrease 06/04/2017 1124 - Adequate for Discharge by Ottie Glazier, RN

## 2017-06-04 NOTE — Progress Notes (Signed)
Pt discharge instructions reviewed with pt and his son. Pt verbalizes understanding. Pt belongings with pt. Pt is not in distress. Pt discharged via wheelchair. Pt's son is driving him home.

## 2017-06-05 ENCOUNTER — Other Ambulatory Visit: Payer: Medicare HMO

## 2017-06-05 DIAGNOSIS — I2510 Atherosclerotic heart disease of native coronary artery without angina pectoris: Secondary | ICD-10-CM | POA: Diagnosis not present

## 2017-06-05 DIAGNOSIS — I481 Persistent atrial fibrillation: Secondary | ICD-10-CM | POA: Diagnosis not present

## 2017-06-05 DIAGNOSIS — I429 Cardiomyopathy, unspecified: Secondary | ICD-10-CM | POA: Diagnosis not present

## 2017-06-05 DIAGNOSIS — K259 Gastric ulcer, unspecified as acute or chronic, without hemorrhage or perforation: Secondary | ICD-10-CM | POA: Diagnosis not present

## 2017-06-05 DIAGNOSIS — E440 Moderate protein-calorie malnutrition: Secondary | ICD-10-CM | POA: Diagnosis not present

## 2017-06-05 DIAGNOSIS — I130 Hypertensive heart and chronic kidney disease with heart failure and stage 1 through stage 4 chronic kidney disease, or unspecified chronic kidney disease: Secondary | ICD-10-CM | POA: Diagnosis not present

## 2017-06-05 DIAGNOSIS — N183 Chronic kidney disease, stage 3 (moderate): Secondary | ICD-10-CM | POA: Diagnosis not present

## 2017-06-05 DIAGNOSIS — I5042 Chronic combined systolic (congestive) and diastolic (congestive) heart failure: Secondary | ICD-10-CM | POA: Diagnosis not present

## 2017-06-05 DIAGNOSIS — E0590 Thyrotoxicosis, unspecified without thyrotoxic crisis or storm: Secondary | ICD-10-CM | POA: Diagnosis not present

## 2017-06-06 ENCOUNTER — Other Ambulatory Visit: Payer: Self-pay | Admitting: *Deleted

## 2017-06-06 NOTE — Patient Outreach (Signed)
Referral received from hospital liason, pt hospitalized 05/26/17-06/06/17, CHF, right pleural effusion, V-tach, telephone call to pt for transition of care week 1, spoke with pt, HIPAA verified, pt reports he will be attending all upcoming appointments, wife assists pt as needed, "different people" provide transportation per pt.  Pt states daughter " is picking up one my feeding supplements"  Pt reports home health working with him.    Outpatient Encounter Medications as of 06/06/2017  Medication Sig Note  . acetaminophen (TYLENOL) 500 MG tablet Take 1,000 mg by mouth every 6 (six) hours as needed for headache (pain).    Marland Kitchen albuterol (PROVENTIL HFA;VENTOLIN HFA) 108 (90 Base) MCG/ACT inhaler Inhale 1-2 puffs into the lungs every 6 (six) hours as needed for wheezing or shortness of breath.   Marland Kitchen atorvastatin (LIPITOR) 20 MG tablet TAKE 1 TABLET (20 MG TOTAL) DAILY AT 6 PM.   . benzonatate (TESSALON) 200 MG capsule Take 1 capsule (200 mg total) by mouth 2 (two) times daily as needed for cough.   . carvedilol (COREG) 12.5 MG tablet Take 12.5 mg by mouth 2 (two) times daily.   Marland Kitchen ELIQUIS 2.5 MG TABS tablet TAKE 1 TABLET TWICE DAILY (Patient taking differently: TAKE 1 TABLET (2.5 MG)TWICE DAILY)   . feeding supplement, ENSURE ENLIVE, (ENSURE ENLIVE) LIQD Take 237 mLs by mouth 2 (two) times daily between meals.   . furosemide (LASIX) 40 MG tablet Take 1 tablet (40 mg total) by mouth 2 (two) times daily.   . hydrALAZINE (APRESOLINE) 25 MG tablet Take 1 tablet (25 mg total) by mouth 3 (three) times daily.   . isosorbide mononitrate (IMDUR) 30 MG 24 hr tablet Take 1 tablet (30 mg total) by mouth at bedtime.   . methimazole (TAPAZOLE) 10 MG tablet Take 1 tablet (10 mg total) by mouth 2 (two) times daily.   Marland Kitchen mexiletine (MEXITIL) 250 MG capsule Take 1 capsule (250 mg total) by mouth 2 (two) times daily.   . mirtazapine (REMERON) 15 MG tablet Take 1 tablet (15 mg total) by mouth at bedtime.   . Multiple Vitamin  (MULTIVITAMIN WITH MINERALS) TABS tablet Take 1 tablet by mouth daily.   Marland Kitchen omega-3 acid ethyl esters (LOVAZA) 1 G capsule Take 2 g by mouth 2 (two) times daily.   . pantoprazole (PROTONIX) 40 MG tablet Take 1 tablet (40 mg total) by mouth daily before breakfast.   . predniSONE (DELTASONE) 10 MG tablet Take 2 tablets (20 mg total) by mouth daily with breakfast.   . promethazine (PHENERGAN) 25 MG tablet Take 12.5-25 mg by mouth every 8 (eight) hours as needed for nausea or vomiting.    . Amino Acids-Protein Hydrolys (FEEDING SUPPLEMENT, PRO-STAT SUGAR FREE 64,) LIQD Take 30 mLs by mouth daily. (Patient not taking: Reported on 06/06/2017)   . ranitidine (ZANTAC) 300 MG tablet Take 300 mg by mouth at bedtime.   . traMADol (ULTRAM) 50 MG tablet Take 1 tablet (50 mg total) by mouth every 12 (twelve) hours as needed for moderate pain or severe pain. (Patient not taking: Reported on 06/06/2017) 05/26/2017: #14 [facility]10[facility]17 [facility] P[facility] AWARE   No facility-administered encounter medications on file as of 06/06/2017.     THN CM Care Plan Problem One     Most Recent Value  Care Plan Problem One  Knowledge deficit related to CHF  Role Documenting the Problem One  Care Management [place] for Problem One  Active  THN Long Term Goal   Pt  will verbalize/ demonstrate better understanding self care for CHF within 60 days  THN Long Term Goal Start Date  06/06/17  Interventions for Problem One Long Term Goal  RN CM reviewed discharge summary, reviewed medications over the phone, reviewed upcoming appointments  THN CM Short Term Goal #1   Pt will verbalize CHF zones / action plan within 30 days  THN CM Short Term Goal #1 Start Date  06/06/17  Interventions for Short Term Goal #1  RN CM reviewed CHF zones/ action plan  THN CM Short Term Goal #2   Pt will weigh daily and record within 30 days  THN CM Short Term Goal #2 Start Date  06/06/17  Interventions for Short Term Goal #2  RN CM reviewed importance  of daily weights, ask pt to weigh daily and record      PLAN See pt for initial home visit next week  Jacqlyn Larsen [facility], [place] [phone]

## 2017-06-10 ENCOUNTER — Other Ambulatory Visit: Payer: Self-pay | Admitting: *Deleted

## 2017-06-10 DIAGNOSIS — I130 Hypertensive heart and chronic kidney disease with heart failure and stage 1 through stage 4 chronic kidney disease, or unspecified chronic kidney disease: Secondary | ICD-10-CM | POA: Diagnosis not present

## 2017-06-10 DIAGNOSIS — E440 Moderate protein-calorie malnutrition: Secondary | ICD-10-CM | POA: Diagnosis not present

## 2017-06-10 DIAGNOSIS — I5042 Chronic combined systolic (congestive) and diastolic (congestive) heart failure: Secondary | ICD-10-CM | POA: Diagnosis not present

## 2017-06-10 DIAGNOSIS — K259 Gastric ulcer, unspecified as acute or chronic, without hemorrhage or perforation: Secondary | ICD-10-CM | POA: Diagnosis not present

## 2017-06-10 DIAGNOSIS — I429 Cardiomyopathy, unspecified: Secondary | ICD-10-CM | POA: Diagnosis not present

## 2017-06-10 DIAGNOSIS — N183 Chronic kidney disease, stage 3 (moderate): Secondary | ICD-10-CM | POA: Diagnosis not present

## 2017-06-10 DIAGNOSIS — I2510 Atherosclerotic heart disease of native coronary artery without angina pectoris: Secondary | ICD-10-CM | POA: Diagnosis not present

## 2017-06-10 DIAGNOSIS — E0590 Thyrotoxicosis, unspecified without thyrotoxic crisis or storm: Secondary | ICD-10-CM | POA: Diagnosis not present

## 2017-06-10 DIAGNOSIS — I481 Persistent atrial fibrillation: Secondary | ICD-10-CM | POA: Diagnosis not present

## 2017-06-10 NOTE — Patient Outreach (Signed)
Red EMMI flag received for 06/08/17, weight 187 pounds, telephone call to patient for follow up, spoke with pt, HIPAA verified, pt reports he had not been weighing and started back and reports " this is one of my lowest weights"  No new concerns voiced.  PLAN See pt for home visit this week  Jacqlyn Larsen [facility], [place] Coordinator [phone]

## 2017-06-11 ENCOUNTER — Telehealth: Payer: Self-pay | Admitting: Cardiology

## 2017-06-11 ENCOUNTER — Inpatient Hospital Stay: Admission: RE | Admit: 2017-06-11 | Payer: Medicare HMO | Source: Ambulatory Visit

## 2017-06-11 DIAGNOSIS — I481 Persistent atrial fibrillation: Secondary | ICD-10-CM | POA: Diagnosis not present

## 2017-06-11 DIAGNOSIS — I2510 Atherosclerotic heart disease of native coronary artery without angina pectoris: Secondary | ICD-10-CM | POA: Diagnosis not present

## 2017-06-11 DIAGNOSIS — I429 Cardiomyopathy, unspecified: Secondary | ICD-10-CM | POA: Diagnosis not present

## 2017-06-11 DIAGNOSIS — I5042 Chronic combined systolic (congestive) and diastolic (congestive) heart failure: Secondary | ICD-10-CM | POA: Diagnosis not present

## 2017-06-11 DIAGNOSIS — I130 Hypertensive heart and chronic kidney disease with heart failure and stage 1 through stage 4 chronic kidney disease, or unspecified chronic kidney disease: Secondary | ICD-10-CM | POA: Diagnosis not present

## 2017-06-11 DIAGNOSIS — E0590 Thyrotoxicosis, unspecified without thyrotoxic crisis or storm: Secondary | ICD-10-CM | POA: Diagnosis not present

## 2017-06-11 DIAGNOSIS — K259 Gastric ulcer, unspecified as acute or chronic, without hemorrhage or perforation: Secondary | ICD-10-CM | POA: Diagnosis not present

## 2017-06-11 DIAGNOSIS — E440 Moderate protein-calorie malnutrition: Secondary | ICD-10-CM | POA: Diagnosis not present

## 2017-06-11 DIAGNOSIS — N183 Chronic kidney disease, stage 3 (moderate): Secondary | ICD-10-CM | POA: Diagnosis not present

## 2017-06-11 NOTE — Telephone Encounter (Signed)
Will route to Dr. Radford Pax for review.

## 2017-06-11 NOTE — Telephone Encounter (Signed)
I am fine with that 

## 2017-06-11 NOTE — Telephone Encounter (Signed)
Spoke with Bruce with [facility] and made him aware Dr. Radford Pax said this was ok.  Bruce appreciative for call.

## 2017-06-11 NOTE — Telephone Encounter (Signed)
New message   Bruce at [facility] is calling to get approval to do occupational therapy 2 times a week for 3 weeks. Please call

## 2017-06-12 ENCOUNTER — Encounter: Payer: Self-pay | Admitting: *Deleted

## 2017-06-12 ENCOUNTER — Other Ambulatory Visit: Payer: Self-pay | Admitting: *Deleted

## 2017-06-12 DIAGNOSIS — I481 Persistent atrial fibrillation: Secondary | ICD-10-CM | POA: Diagnosis not present

## 2017-06-12 DIAGNOSIS — E440 Moderate protein-calorie malnutrition: Secondary | ICD-10-CM | POA: Diagnosis not present

## 2017-06-12 DIAGNOSIS — I130 Hypertensive heart and chronic kidney disease with heart failure and stage 1 through stage 4 chronic kidney disease, or unspecified chronic kidney disease: Secondary | ICD-10-CM | POA: Diagnosis not present

## 2017-06-12 DIAGNOSIS — I2510 Atherosclerotic heart disease of native coronary artery without angina pectoris: Secondary | ICD-10-CM | POA: Diagnosis not present

## 2017-06-12 DIAGNOSIS — K259 Gastric ulcer, unspecified as acute or chronic, without hemorrhage or perforation: Secondary | ICD-10-CM | POA: Diagnosis not present

## 2017-06-12 DIAGNOSIS — N183 Chronic kidney disease, stage 3 (moderate): Secondary | ICD-10-CM | POA: Diagnosis not present

## 2017-06-12 DIAGNOSIS — I429 Cardiomyopathy, unspecified: Secondary | ICD-10-CM | POA: Diagnosis not present

## 2017-06-12 DIAGNOSIS — E0590 Thyrotoxicosis, unspecified without thyrotoxic crisis or storm: Secondary | ICD-10-CM | POA: Diagnosis not present

## 2017-06-12 DIAGNOSIS — I5042 Chronic combined systolic (congestive) and diastolic (congestive) heart failure: Secondary | ICD-10-CM | POA: Diagnosis not present

## 2017-06-12 NOTE — Patient Outreach (Signed)
[facility]) Care Management   06/12/2017  [name] 11/19/1932 [id number]  [name] is an 84 y.o. male  Subjective: Initial home visit with pt, HIPAA verified, wife present, pt reports home health RN and PT through  [place] seeing pt several times weekly, pt states his priority goal is "to get stronger and stay out of hospital"  Pt reports he is recording weight daily now and just started this a few days ago.  Pt states his action plan is " go to ED"  Pt has friends and family assisting with transportation and other aspects of care.    Objective:   Vitals:   06/12/17 0953  BP: 138/70  Pulse: 70  Resp: 16  Weight: 179 lb (81.2 kg)  Height: 1.829 m (6')   ROS  Physical Exam  Constitutional: He is oriented to person, place, and time. He appears well-developed.  HENT:  Head: Normocephalic.  Neck: Normal range of motion. Neck supple.  Respiratory: Effort normal and breath sounds normal.  Dyspnea with exertion  GI: Soft. Bowel sounds are normal.  Musculoskeletal: Normal range of motion. He exhibits edema.  1+ edema lower extremities bil  Neurological: He is alert and oriented to person, place, and time.  Skin: Skin is warm and dry.  Psychiatric: He has a normal mood and affect. His behavior is normal. Thought content normal.    Encounter Medications:   Outpatient Encounter Medications as of 06/12/2017  Medication Sig Note  . acetaminophen (TYLENOL) 500 MG tablet Take 1,000 mg by mouth every 6 (six) hours as needed for headache (pain).    Marland Kitchen albuterol (PROVENTIL HFA;VENTOLIN HFA) 108 (90 Base) MCG/ACT inhaler Inhale 1-2 puffs into the lungs every 6 (six) hours as needed for wheezing or shortness of breath.   Marland Kitchen atorvastatin (LIPITOR) 20 MG tablet TAKE 1 TABLET (20 MG TOTAL) DAILY AT 6 PM.   . benzonatate (TESSALON) 200 MG capsule Take 1 capsule (200 mg total) by mouth 2 (two) times daily as needed for cough.   . carvedilol (COREG) 12.5 MG  tablet Take 12.5 mg by mouth 2 (two) times daily.   Marland Kitchen ELIQUIS 2.5 MG TABS tablet TAKE 1 TABLET TWICE DAILY (Patient taking differently: TAKE 1 TABLET (2.5 MG)TWICE DAILY)   . feeding supplement, ENSURE ENLIVE, (ENSURE ENLIVE) LIQD Take 237 mLs by mouth 2 (two) times daily between meals.   . furosemide (LASIX) 40 MG tablet Take 1 tablet (40 mg total) by mouth 2 (two) times daily.   . hydrALAZINE (APRESOLINE) 25 MG tablet Take 1 tablet (25 mg total) by mouth 3 (three) times daily.   . isosorbide mononitrate (IMDUR) 30 MG 24 hr tablet Take 1 tablet (30 mg total) by mouth at bedtime.   . methimazole (TAPAZOLE) 10 MG tablet Take 1 tablet (10 mg total) by mouth 2 (two) times daily.   Marland Kitchen mexiletine (MEXITIL) 250 MG capsule Take 1 capsule (250 mg total) by mouth 2 (two) times daily.   . mirtazapine (REMERON) 15 MG tablet Take 1 tablet (15 mg total) by mouth at bedtime.   . Multiple Vitamin (MULTIVITAMIN WITH MINERALS) TABS tablet Take 1 tablet by mouth daily.   Marland Kitchen omega-3 acid ethyl esters (LOVAZA) 1 G capsule Take 2 g by mouth 2 (two) times daily.   . pantoprazole (PROTONIX) 40 MG tablet Take 1 tablet (40 mg total) by mouth daily before breakfast.   . predniSONE (DELTASONE) 10 MG tablet Take 2 tablets (20 mg total)  by mouth daily with breakfast.   . promethazine (PHENERGAN) 25 MG tablet Take 12.5-25 mg by mouth every 8 (eight) hours as needed for nausea or vomiting.    . ranitidine (ZANTAC) 300 MG tablet Take 300 mg by mouth at bedtime.   . traMADol (ULTRAM) 50 MG tablet Take 1 tablet (50 mg total) by mouth every 12 (twelve) hours as needed for moderate pain or severe pain. 05/26/2017: [facility]  . Amino Acids-Protein Hydrolys (FEEDING SUPPLEMENT, PRO-STAT SUGAR FREE 64,) LIQD Take 30 mLs by mouth daily. (Patient not taking: Reported on 06/06/2017)    No facility-administered encounter medications on file as of 06/12/2017.     Functional Status:   In your present state of health,  do you have any difficulty performing the following activities: 06/12/2017 05/27/2017  Hearing? Y -  Comment both ears " a little" per pt,  pt has no hearing aides and not interested -  Vision? N -  Difficulty concentrating or making decisions? Y -  Comment at times -  Walking or climbing stairs? Y -  Dressing or bathing? Y -  Doing errands, shopping? Y N  Preparing Food and eating ? N -  Using the Toilet? N -  In the past six months, have you accidently leaked urine? N -  Do you have problems with loss of bowel control? N -  Managing your Medications? N -  Managing your Finances? N -  Housekeeping or managing your Housekeeping? Y -  Some recent data might be hidden    Fall/Depression Screening:    Fall Risk  06/12/2017 05/09/2015  Falls in the past year? No No  Risk for fall due to : Medication side effect;Impaired balance/gait -   PHQ 2/9 Scores 06/12/2017 12/27/2015 05/09/2015  PHQ - 2 Score 1 0 0    Assessment:  RN CM reviewed medications with pt, observed all bottles, pt oversees his own medications and does not want prefilled med box.  Patient's son dropped in during visit and visits pt several times weekly and stays all day to help pt and spouse.  Pt manages his own care, wife is of limited assistance, she cleans and cooks but does not oversee medications, etc.  RN CM faxed initial home visit and barrier letter to primary MD Dr. Olen Pel.  THN CM Care Plan Problem One     Most Recent Value  Care Plan Problem One  Knowledge deficit related to CHF  Role Documenting the Problem One  Care Management [place] for Problem One  Active  THN Long Term Goal   Pt will verbalize/ demonstrate better understanding self care for CHF within 60 days  THN Long Term Goal Start Date  06/06/17  Interventions for Problem One Long Term Goal  RN CM reviewed medications with pt, gave [facility] calendar and reviewed resources, called [employer] and ordered well dine meals low sodium, will be delivered  March 13th by [employer] or [employer], reviewed all upcoming appointments  THN CM Short Term Goal #1   Pt will verbalize CHF zones / action plan within 30 days  THN CM Short Term Goal #1 Start Date  06/06/17  Interventions for Short Term Goal #1  RN CM gave copy of CHF zones/ action plan and reviewed with pt and wife, reviewed calling cardiologist for weight gain, edema, change in symptoms  THN CM Short Term Goal #2   Pt will weigh daily and record within 30 days  THN CM Short Term Goal #2 Start Date  06/06/17  Interventions for Short Term Goal #2  RN CM reinforced importance of daily weights, ask pt to weigh daily and record    [facility] CM Care Plan Problem Two     Most Recent Value  Care Plan Problem Two  Pt has decreased endurance  Role Documenting the Problem Two  Care Management [place] for Problem Two  Active  THN CM Short Term Goal #1   Pt will work with home health PT and complete prescribed exercises, verbalize increase endurance within 30 days  THN CM Short Term Goal #1 Start Date  06/12/17  Interventions for Short Term Goal #2   RN CM ask pt to complete prescribed exercises and work closely with PT for better outcomes, reviewed energy conservation      Plan: continue weekly transition of care calls See pt for home visit next month  Jacqlyn Larsen [facility], [place] [phone]

## 2017-06-13 DIAGNOSIS — K219 Gastro-esophageal reflux disease without esophagitis: Secondary | ICD-10-CM | POA: Diagnosis not present

## 2017-06-13 DIAGNOSIS — R112 Nausea with vomiting, unspecified: Secondary | ICD-10-CM | POA: Diagnosis not present

## 2017-06-13 DIAGNOSIS — D500 Iron deficiency anemia secondary to blood loss (chronic): Secondary | ICD-10-CM | POA: Diagnosis not present

## 2017-06-13 DIAGNOSIS — R634 Abnormal weight loss: Secondary | ICD-10-CM | POA: Diagnosis not present

## 2017-06-14 ENCOUNTER — Ambulatory Visit (INDEPENDENT_AMBULATORY_CARE_PROVIDER_SITE_OTHER)
Admission: RE | Admit: 2017-06-14 | Discharge: 2017-06-14 | Disposition: A | Discharge status: Home / Self Care | Payer: Medicare HMO | Source: Ambulatory Visit | Attending: Pulmonary Disease | Admitting: Pulmonary Disease

## 2017-06-14 ENCOUNTER — Ambulatory Visit (INDEPENDENT_AMBULATORY_CARE_PROVIDER_SITE_OTHER): Payer: Medicare HMO | Admitting: Cardiology

## 2017-06-14 ENCOUNTER — Encounter: Payer: Self-pay | Admitting: Cardiology

## 2017-06-14 VITALS — BP 124/77 | HR 80 | Ht 72.0 in | Wt 183.2 lb

## 2017-06-14 DIAGNOSIS — E0590 Thyrotoxicosis, unspecified without thyrotoxic crisis or storm: Secondary | ICD-10-CM | POA: Diagnosis not present

## 2017-06-14 DIAGNOSIS — I4819 Other persistent atrial fibrillation: Secondary | ICD-10-CM

## 2017-06-14 DIAGNOSIS — J90 Pleural effusion, not elsewhere classified: Secondary | ICD-10-CM

## 2017-06-14 DIAGNOSIS — I350 Nonrheumatic aortic (valve) stenosis: Secondary | ICD-10-CM

## 2017-06-14 DIAGNOSIS — I5043 Acute on chronic combined systolic (congestive) and diastolic (congestive) heart failure: Secondary | ICD-10-CM

## 2017-06-14 DIAGNOSIS — E785 Hyperlipidemia, unspecified: Secondary | ICD-10-CM | POA: Diagnosis not present

## 2017-06-14 DIAGNOSIS — I509 Heart failure, unspecified: Secondary | ICD-10-CM | POA: Diagnosis not present

## 2017-06-14 DIAGNOSIS — J849 Interstitial pulmonary disease, unspecified: Secondary | ICD-10-CM | POA: Diagnosis not present

## 2017-06-14 DIAGNOSIS — I2510 Atherosclerotic heart disease of native coronary artery without angina pectoris: Secondary | ICD-10-CM

## 2017-06-14 DIAGNOSIS — I5042 Chronic combined systolic (congestive) and diastolic (congestive) heart failure: Secondary | ICD-10-CM | POA: Diagnosis not present

## 2017-06-14 DIAGNOSIS — N183 Chronic kidney disease, stage 3 unspecified: Secondary | ICD-10-CM

## 2017-06-14 DIAGNOSIS — I481 Persistent atrial fibrillation: Secondary | ICD-10-CM | POA: Diagnosis not present

## 2017-06-14 DIAGNOSIS — I10 Essential (primary) hypertension: Secondary | ICD-10-CM | POA: Diagnosis not present

## 2017-06-14 DIAGNOSIS — I472 Ventricular tachycardia, unspecified: Secondary | ICD-10-CM

## 2017-06-14 DIAGNOSIS — I429 Cardiomyopathy, unspecified: Secondary | ICD-10-CM | POA: Diagnosis not present

## 2017-06-14 DIAGNOSIS — I255 Ischemic cardiomyopathy: Secondary | ICD-10-CM | POA: Diagnosis not present

## 2017-06-14 DIAGNOSIS — E440 Moderate protein-calorie malnutrition: Secondary | ICD-10-CM | POA: Diagnosis not present

## 2017-06-14 DIAGNOSIS — K259 Gastric ulcer, unspecified as acute or chronic, without hemorrhage or perforation: Secondary | ICD-10-CM | POA: Diagnosis not present

## 2017-06-14 DIAGNOSIS — I130 Hypertensive heart and chronic kidney disease with heart failure and stage 1 through stage 4 chronic kidney disease, or unspecified chronic kidney disease: Secondary | ICD-10-CM | POA: Diagnosis not present

## 2017-06-14 DIAGNOSIS — R918 Other nonspecific abnormal finding of lung field: Secondary | ICD-10-CM | POA: Diagnosis not present

## 2017-06-14 NOTE — Progress Notes (Signed)
Cardiology Office Note:    Date:  06/16/2017   ID:  [name], DOB Aug 22, 1932, MRN [id number]  PCP:  [name], MD  Cardiologist:  [name], MD    Referring MD: [name], MD   Chief Complaint  Patient presents with  . Cardiomyopathy  . Shortness of Breath  . Congestive Heart Failure  . Coronary Artery Disease  . Hypertension    History of Present Illness:    [name] is a 84 y.o. male with a hx of ischemic heart disease with chronic systolic heart failure, EF 25-30% status post CRT-D.  He has a history of coronary artery disease status post prior coronary artery bypass surgery as well as severe aortic stenosis status post pericardial tissue AVR in 2009.  He has a history of ventricular tachycardia and due to chronic dyspnea amiodarone was avoided and he was started on mexiletine.  He has had problems recently with rapid weight loss associated with melena.  Workup at that time showed he was hyper thyroid with a TSH of less than 0.01 and was started on methimazole.    He was admitted to the hospital last month with worsening nausea, anorexia, abdominal discomfort and severe dyspnea on exertion as well as 2 pillow orthopnea.  2D echocardiogram at that time showed a decline in EF to 20-25%.  He was found to be in acute on chronic systolic/diastolic heart failure and was diuresed with IV Lasix.  Repeat cardiac catheterization showed three-vessel coronary disease with stable revascularization with a patent LIMA-LAD, SVG-diagonal and SVG-RCA.  He was noted to have moderate pulmonary hypertension due to mixture of pulmonary venous and pulmonary arterial hypertension (WHO group 2 and 3) and normal cardiac output.  He was found to have a large right pleural effusion and underwent thoracentesis by interventional radiology of 2 L of fluid.  Repeat chest x-ray showed improvement in CHF with small bilateral pleural effusions.  It was recommended that he be sent to skilled  nursing facility at discharge but patient refused.  He was discharged home on 06/04/2017.  Since then he has complained of continued nausea although his abdominal pain he thinks is better.  He is developed worsening shortness of breath to the point he can barely walk any distance without getting short of breath.  He denies any chest pain or pressure.  He denies any lower extremity edema.    Past Medical History:  Diagnosis Date  . AICD (automatic cardioverter/defibrillator) present   . Arthritis    "minor everywhere" (04/22/2015)  . Asthma    "a touch"  . Barrett esophagus   . CAD (coronary artery disease)    a. s/p 2 vessel CABG with LIMA to LAD, SVG to diagonal 1, SVG to RCA 12/09.  [name] Chronic combined systolic and diastolic CHF (congestive heart failure) (HCC)    dry weight 197-200lbs.  . CKD (chronic kidney disease), stage III ([place])   . Diabetes ([place])    "I'm prediabetic" (04/22/2015)  . Dyspnea   . GERD (gastroesophageal reflux disease)   . H/O: rheumatic fever   . Hepatitis 1957   "don't know what kind:  . History of hiatal hernia   . History of PFTs    a. Amiodarone started 5/16 >> PFTs w/ DLCO 6/16:  FEV1 72% predicted, FEV1/FVC 66%, DLCO 66% >> minimal reversible obstructive airways disease with mild diffusion defect (suggestive of emphysema but absence of hyperinflation inconsistent with dx)  . HTN (hypertension)   . Hypercholesteremia   .  NICM (nonischemic cardiomyopathy) ([place])    EF 15-20% by echo 2017  . Orthostatic hypotension   . PAF (paroxysmal atrial fibrillation) ([place])   . Pneumonia 08/2014   "dr thought I may have had a touch"  . S/P AVR (aortic valve replacement) 2009   a. severe AS s/p AVR with pericardial tissue valve 2009.  [name] Ventricular tachycardia [facility])     Past Surgical History:  Procedure Laterality Date  . AORTIC VALVE REPLACEMENT  with 23-mm Magna Ease pericardial valve, model number   with 23-mm Magna Ease pericardial valve, model [id number],  serial number [id number]   . APPENDECTOMY  1940s  . BI-VENTRICULAR IMPLANTABLE CARDIOVERTER DEFIBRILLATOR  (CRT-D)  04/22/2015  . CARDIAC CATHETERIZATION N/A 11/04/2014   Procedure: Left Heart Cath and Cors/Grafts Angiography;  Surgeon: Leonie Man, MD;  Location: [facility];  Service: Cardiovascular;  Laterality: N/A;  . CARDIAC VALVE REPLACEMENT    . CARDIOVERSION N/A 01/27/2013   Procedure: CARDIOVERSION;  Surgeon: Sueanne Margarita, MD;  Location: [place];  Service: Cardiovascular;  Laterality: N/A;  . CARDIOVERSION N/A 08/25/2014   Procedure: CARDIOVERSION;  Surgeon: Sanda Klein, MD;  Location: [facility];  Service: Cardiovascular;  Laterality: N/A;  . CATARACT EXTRACTION W/ INTRAOCULAR LENS  IMPLANT, BILATERAL Bilateral   . CORONARY ARTERY BYPASS GRAFT  03/10/2008   x 3 Dr. Roxan Hockey  . EP IMPLANTABLE DEVICE N/A 04/22/2015   Procedure: BiV ICD Insertion CRT-D;  Surgeon: [name], MD;  Location: [facility];  Service: Cardiovascular;  Laterality: N/A;  . ESOPHAGOGASTRODUODENOSCOPY (EGD) WITH ESOPHAGEAL DILATION  X1  . ESOPHAGOGASTRODUODENOSCOPY (EGD) WITH PROPOFOL N/A 05/09/2017   Procedure: ESOPHAGOGASTRODUODENOSCOPY (EGD) WITH PROPOFOL;  Surgeon: Carol Ada, MD;  Location: [facility];  Service: Endoscopy;  Laterality: N/A;  . IR THORACENTESIS ASP PLEURAL SPACE W/IMG GUIDE  05/27/2017  . RIGHT HEART CATH AND CORONARY/GRAFT ANGIOGRAPHY N/A 05/31/2017   Procedure: RIGHT HEART CATH AND CORONARY/GRAFT ANGIOGRAPHY;  Surgeon: Jolaine Artist, MD;  Location: [facility];  Service: Cardiovascular;  Laterality: N/A;  . TONSILLECTOMY      Current Medications: Current Meds  Medication Sig  . acetaminophen (TYLENOL) 500 MG tablet Take 1,000 mg by mouth every 6 (six) hours as needed for headache (pain).   [name] albuterol (PROVENTIL HFA;VENTOLIN HFA) 108 (90 Base) MCG/ACT inhaler Inhale 1-2 puffs into the lungs every 6 (six) hours as needed for wheezing.     . Amino Acids-Protein Hydrolys (FEEDING SUPPLEMENT, PRO-STAT SUGAR FREE 64,) LIQD Take 30 mLs by mouth daily. (Patient not taking: Reported on 06/15/2017)  . atorvastatin (LIPITOR) 20 MG tablet TAKE 1 TABLET (20 MG TOTAL) DAILY AT 6 PM.  . benzonatate (TESSALON) 200 MG capsule Take 1 capsule (200 mg total) by mouth 2 (two) times daily as needed for cough.  . carvedilol (COREG) 12.5 MG tablet Take 12.5 mg by mouth 2 (two) times daily.  [name] ELIQUIS 2.5 MG TABS tablet TAKE 1 TABLET TWICE DAILY (Patient taking differently: TAKE 1 TABLET (2.5 MG) BY MOUTH TWICE DAILY)  . feeding supplement, ENSURE ENLIVE, (ENSURE ENLIVE) LIQD Take 237 mLs by mouth 2 (two) times daily between meals. (Patient not taking: Reported on 06/15/2017)  . furosemide (LASIX) 40 MG tablet Take 1 tablet (40 mg total) by mouth 2 (two) times daily.  . hydrALAZINE (APRESOLINE) 25 MG tablet Take 12.5 mg by mouth 2 (two) times daily.  . isosorbide mononitrate (IMDUR) 30 MG 24 hr tablet Take 1 tablet (30 mg total) by  mouth at bedtime. (Patient taking differently: Take 15 mg by mouth every evening. )  . methimazole (TAPAZOLE) 10 MG tablet Take 1 tablet (10 mg total) by mouth 2 (two) times daily.  [name] mexiletine (MEXITIL) 250 MG capsule Take 1 capsule (250 mg total) by mouth 2 (two) times daily.  . mirtazapine (REMERON) 15 MG tablet Take 1 tablet (15 mg total) by mouth at bedtime.  . Multiple Vitamin (MULTIVITAMIN WITH MINERALS) TABS tablet Take 1 tablet by mouth 2 (two) times daily.   [name] omega-3 acid ethyl esters (LOVAZA) 1 G capsule Take 2 g by mouth 2 (two) times daily.  . pantoprazole (PROTONIX) 40 MG tablet Take 1 tablet (40 mg total) by mouth daily before breakfast.  . predniSONE (DELTASONE) 10 MG tablet Take 2 tablets (20 mg total) by mouth daily with breakfast.  . ranitidine (ZANTAC) 300 MG tablet Take 300 mg by mouth at bedtime.  . traMADol (ULTRAM) 50 MG tablet Take 1 tablet (50 mg total) by mouth every 12 (twelve) hours as needed for  moderate pain or severe pain.  . [DISCONTINUED] promethazine (PHENERGAN) 25 MG tablet Take 12.5-25 mg by mouth every 8 (eight) hours as needed for nausea or vomiting.      Allergies:   Novocain [procaine]   Social History   Socioeconomic History  . Marital status: Married    Spouse name: None  . Number of children: None  . Years of education: None  . Highest education level: None  Social Needs  . Financial resource strain: None  . Food insecurity - worry: None  . Food insecurity - inability: None  . Transportation needs - medical: None  . Transportation needs - non-medical: None  Occupational History  . None  Tobacco Use  . Smoking status: Never Smoker  . Smokeless tobacco: Never Used  Substance and Sexual Activity  . Alcohol use: Yes    Comment: 04/22/2015 "nothing since the 1980s; never had a problem w/it"  . Drug use: No  . Sexual activity: No  Other Topics Concern  . None  Social History Narrative   Married, lives with spouse [name]   4 children, 2 boys and 2 girls > one daughter passed   OCCUPATION: retired, Pension scheme manager for [employer]     Family History: The patient's family history includes Alzheimer's disease in his mother and unknown relative; COPD in his daughter and sister; Depression in his sister; Esophageal cancer in his sister; Heart disease in his sister; Hyperlipidemia in his sister; Hypertension in his sister.  ROS:   Please see the history of present illness.    ROS  All other systems reviewed and negative.   EKGs/Labs/Other Studies Reviewed:    The following studies were reviewed today: Cardiac cath 05/2017 and echo 05/2017  EKG:  EKG is not ordered today.    Recent Labs: 05/26/2017: TSH <0.010 06/03/2017: Magnesium 2.2 06/14/2017: NT-Pro BNP 23,607 06/15/2017: ALT 35; B Natriuretic Peptide 2,293.2 06/16/2017: BUN 31; Creatinine, Ser 1.67; Hemoglobin 11.9; Platelets 376; Potassium 3.2; Sodium 141   Recent Lipid Panel    Component  Value Date/Time   CHOL 126 07/30/2016 1143   CHOL 93 (L) 06/16/2014 0914   TRIG 154 (H) 07/30/2016 1143   TRIG 124 06/16/2014 0914   HDL 45 07/30/2016 1143   HDL 34 (L) 06/16/2014 0914   CHOLHDL 2.8 07/30/2016 1143   CHOLHDL 2.7 01/19/2015 1005   VLDL 32 (H) 01/19/2015 1005   LDLCALC 50 07/30/2016 1143   LDLCALC 34  06/16/2014 0914    Physical Exam:    VS:  BP 124/77   Pulse 80   Ht 6' (1.829 m)   Wt 183 lb 3.2 oz (83.1 kg)   BMI 24.85 kg/m     Wt Readings from Last 3 Encounters:  06/15/17 177 lb 11.2 oz (80.6 kg)  06/14/17 183 lb 3.2 oz (83.1 kg)  06/12/17 179 lb (81.2 kg)     GEN:  Thin, ill appearing male in NAD HEENT: Normal NECK: No JVD; No carotid bruits LYMPHATICS: No lymphadenopathy CARDIAC: RRR, no murmurs, rubs, gallops RESPIRATORY:  Decreased BS throughout right lung field ABDOMEN: Soft, non-tender, non-distended MUSCULOSKELETAL:  No edema; No deformity  SKIN: Warm and dry NEUROLOGIC:  Alert and oriented x 3 PSYCHIATRIC:  Normal affect   ASSESSMENT:    1. Coronary artery disease involving native coronary artery of native heart without angina pectoris. 2.  Ischemic cardiomyopathy 3.  Acute on chronic combined systolic and diastolic CHF 4.  Essential hypertension 5.  Aortic stenosis, severe 6.  Persistent atrial fibrillation 7.  VT (ventricular tachycardia) 8.  Pleural effusion 9.  CKD (chronic kidney disease) stage III, GFR 30-59 mL/min 10.  Dyslipidemia    PLAN:    In order of problems listed above:  1.  Severe three-vessel coronary disease - status post remote CABG.  Cath in February 2019 showed patent grafts.  He denies any anginal symptoms.  He is not on ASA due to NOAC . Continue statin and BB.  2.  Ischemic DCM - last EF assessment last month showed EF 20-25%.  He is S/P CRT-D.  3.  Acute on chronic combined systolic/diastolic heart failure.  He continues to have symptoms of severe dyspnea on exertion likely related to reaccumulated right  pleural effusion.  Recent cardiac cath amonth ago showed normal cardiac output.  Outpatient chest CT was done earlier today and was ordered by pulmonary to reassess pleural effusion.  It appears that this is significantly increased in size again.   I will check a BNP and BMET today.  He has an appt with Pulmonary on Monday to discuss CT findings.  He will likely need repeat thoracentesis.   Continue on Lasix 43m BID, Hydralazine 12.520mBID, Imdur 1587maily.    4.  HTN - BP is controlled on exam today.  Continue Carvedilol and Hydralazine .   5 . Severe AS s/p pericardial AVR - Stable by echo 05/2017  6.  Persistent atrial fibrillation - he is maintaining NSR by exam.  Continue Eliqius 2.5mg17mD and carvedilol 12.5mg 37m.    7.  VT - stable on Mexiiltene and carvedilol 12.5mg B82m    8.  Right pleural effusion s/p thoracentesis 05/2017 - now reaccumulated on chest CT order by pulmonary and done today - likely contributing to SOB.  He has an appt with Dr. MannemWonda Amisnday.  9.  CKD stage 3 - creatinine 1.3 on 06/04/2017.  Will repeat BMET tooday.    10.  Hyperlipidemia - LDL goal < 70.  Continue Lipitor 20mg d41m.     Medication Adjustments/Labs and Tests Ordered: Current medicines are reviewed at length with the patient today.  Concerns regarding medicines are outlined above.  Orders Placed This Encounter  Procedures  . Basic metabolic panel  . CBC  . Pro b natriuretic peptide (BNP)   No orders of the defined types were placed in this encounter.   Signed, Finnley Lewis TFransico Him/01/2018 10:52 AM    [facility]

## 2017-06-14 NOTE — Patient Instructions (Addendum)
Medication Instructions:  Your physician recommends that you continue on your current medications as directed. Please refer to the Current Medication list given to you today.  Labwork: Today for kidney function, complete blood count, and BNP  Testing/Procedures: None ordered   Follow-Up: Your physician recommends that you schedule a follow-up appointment in: 2 weeks with a PA  Your physician recommends that you schedule a follow-up appointment in: 3 months with Dr. Radford Pax   Your physician recommends that you schedule a follow-up appointment in: AHF clinic with Dr. Haroldine Laws as soon as possible.    Any Other Special Instructions Will Be Listed Below (If Applicable).  Thank you for choosing [facility], RN  [phone]   If you need a refill on your cardiac medications before your next appointment, please call your pharmacy.

## 2017-06-15 ENCOUNTER — Emergency Department (HOSPITAL_COMMUNITY): Payer: Medicare HMO

## 2017-06-15 ENCOUNTER — Other Ambulatory Visit: Payer: Self-pay

## 2017-06-15 ENCOUNTER — Telehealth: Payer: Self-pay | Admitting: Cardiology

## 2017-06-15 ENCOUNTER — Encounter (HOSPITAL_COMMUNITY): Payer: Self-pay | Admitting: Emergency Medicine

## 2017-06-15 ENCOUNTER — Inpatient Hospital Stay (HOSPITAL_COMMUNITY)
Admission: EM | Admit: 2017-06-15 | Discharge: 2017-06-20 | DRG: 291 | Disposition: A | Discharge status: Home Health | Payer: Medicare HMO | Attending: Cardiology | Admitting: Cardiology

## 2017-06-15 DIAGNOSIS — I110 Hypertensive heart disease with heart failure: Secondary | ICD-10-CM | POA: Diagnosis not present

## 2017-06-15 DIAGNOSIS — Z79899 Other long term (current) drug therapy: Secondary | ICD-10-CM | POA: Diagnosis not present

## 2017-06-15 DIAGNOSIS — E0580 Other thyrotoxicosis without thyrotoxic crisis or storm: Secondary | ICD-10-CM | POA: Diagnosis present

## 2017-06-15 DIAGNOSIS — E1122 Type 2 diabetes mellitus with diabetic chronic kidney disease: Secondary | ICD-10-CM | POA: Diagnosis present

## 2017-06-15 DIAGNOSIS — Z7901 Long term (current) use of anticoagulants: Secondary | ICD-10-CM

## 2017-06-15 DIAGNOSIS — K219 Gastro-esophageal reflux disease without esophagitis: Secondary | ICD-10-CM | POA: Diagnosis present

## 2017-06-15 DIAGNOSIS — R091 Pleurisy: Secondary | ICD-10-CM | POA: Diagnosis not present

## 2017-06-15 DIAGNOSIS — I951 Orthostatic hypotension: Secondary | ICD-10-CM | POA: Diagnosis present

## 2017-06-15 DIAGNOSIS — I472 Ventricular tachycardia: Secondary | ICD-10-CM | POA: Diagnosis present

## 2017-06-15 DIAGNOSIS — N179 Acute kidney failure, unspecified: Secondary | ICD-10-CM | POA: Diagnosis not present

## 2017-06-15 DIAGNOSIS — Z9841 Cataract extraction status, right eye: Secondary | ICD-10-CM

## 2017-06-15 DIAGNOSIS — I482 Chronic atrial fibrillation: Secondary | ICD-10-CM | POA: Diagnosis not present

## 2017-06-15 DIAGNOSIS — Z951 Presence of aortocoronary bypass graft: Secondary | ICD-10-CM | POA: Diagnosis not present

## 2017-06-15 DIAGNOSIS — Z952 Presence of prosthetic heart valve: Secondary | ICD-10-CM

## 2017-06-15 DIAGNOSIS — I509 Heart failure, unspecified: Secondary | ICD-10-CM | POA: Diagnosis not present

## 2017-06-15 DIAGNOSIS — N183 Chronic kidney disease, stage 3 (moderate): Secondary | ICD-10-CM | POA: Diagnosis present

## 2017-06-15 DIAGNOSIS — R627 Adult failure to thrive: Secondary | ICD-10-CM | POA: Diagnosis present

## 2017-06-15 DIAGNOSIS — I5043 Acute on chronic combined systolic (congestive) and diastolic (congestive) heart failure: Secondary | ICD-10-CM | POA: Diagnosis not present

## 2017-06-15 DIAGNOSIS — Z961 Presence of intraocular lens: Secondary | ICD-10-CM | POA: Diagnosis present

## 2017-06-15 DIAGNOSIS — Z884 Allergy status to anesthetic agent status: Secondary | ICD-10-CM | POA: Diagnosis not present

## 2017-06-15 DIAGNOSIS — T462X5D Adverse effect of other antidysrhythmic drugs, subsequent encounter: Secondary | ICD-10-CM

## 2017-06-15 DIAGNOSIS — K921 Melena: Secondary | ICD-10-CM | POA: Diagnosis not present

## 2017-06-15 DIAGNOSIS — E7800 Pure hypercholesterolemia, unspecified: Secondary | ICD-10-CM | POA: Diagnosis present

## 2017-06-15 DIAGNOSIS — J918 Pleural effusion in other conditions classified elsewhere: Secondary | ICD-10-CM | POA: Diagnosis present

## 2017-06-15 DIAGNOSIS — I2510 Atherosclerotic heart disease of native coronary artery without angina pectoris: Secondary | ICD-10-CM | POA: Diagnosis present

## 2017-06-15 DIAGNOSIS — Z95810 Presence of automatic (implantable) cardiac defibrillator: Secondary | ICD-10-CM

## 2017-06-15 DIAGNOSIS — J90 Pleural effusion, not elsewhere classified: Secondary | ICD-10-CM | POA: Diagnosis not present

## 2017-06-15 DIAGNOSIS — R848 Other abnormal findings in specimens from respiratory organs and thorax: Secondary | ICD-10-CM | POA: Diagnosis not present

## 2017-06-15 DIAGNOSIS — J449 Chronic obstructive pulmonary disease, unspecified: Secondary | ICD-10-CM | POA: Diagnosis present

## 2017-06-15 DIAGNOSIS — Z9889 Other specified postprocedural states: Secondary | ICD-10-CM

## 2017-06-15 DIAGNOSIS — R0602 Shortness of breath: Secondary | ICD-10-CM | POA: Diagnosis not present

## 2017-06-15 DIAGNOSIS — I130 Hypertensive heart and chronic kidney disease with heart failure and stage 1 through stage 4 chronic kidney disease, or unspecified chronic kidney disease: Principal | ICD-10-CM | POA: Diagnosis present

## 2017-06-15 DIAGNOSIS — Z9842 Cataract extraction status, left eye: Secondary | ICD-10-CM

## 2017-06-15 DIAGNOSIS — I5042 Chronic combined systolic (congestive) and diastolic (congestive) heart failure: Secondary | ICD-10-CM | POA: Diagnosis not present

## 2017-06-15 DIAGNOSIS — I480 Paroxysmal atrial fibrillation: Secondary | ICD-10-CM | POA: Diagnosis present

## 2017-06-15 DIAGNOSIS — I4891 Unspecified atrial fibrillation: Secondary | ICD-10-CM | POA: Diagnosis not present

## 2017-06-15 LAB — BASIC METABOLIC PANEL
ANION GAP: 12 (ref 5–15)
BUN/Creatinine Ratio: 18 (ref 10–24)
BUN: 34 mg/dL — ABNORMAL HIGH (ref 8–27)
BUN: 33 mg/dL — ABNORMAL HIGH (ref 6–20)
CALCIUM: 8.4 mg/dL — AB (ref 8.9–10.3)
CHLORIDE: 101 mmol/L (ref 96–106)
CHLORIDE: 106 mmol/L (ref 101–111)
CO2: 25 mmol/L (ref 20–29)
CO2: 27 mmol/L (ref 22–32)
Calcium: 8.6 mg/dL (ref 8.6–10.2)
Creatinine, Ser: 1.89 mg/dL — ABNORMAL HIGH (ref 0.76–1.27)
Creatinine, Ser: 1.8 mg/dL — ABNORMAL HIGH (ref 0.61–1.24)
GFR calc Af Amer: 37 mL/min/{1.73_m2} — ABNORMAL LOW (ref >59)
GFR calc non Af Amer: 33 mL/min — ABNORMAL LOW (ref >60)
GFR, EST AFRICAN AMERICAN: 39 mL/min — AB (ref >60)
GFR, EST NON AFRICAN AMERICAN: 32 mL/min/{1.73_m2} — AB (ref >59)
GLUCOSE: 153 mg/dL — AB (ref 65–99)
GLUCOSE: 88 mg/dL (ref 65–99)
POTASSIUM: 4.1 mmol/L (ref 3.5–5.2)
Potassium: 4.4 mmol/L (ref 3.5–5.1)
SODIUM: 144 mmol/L (ref 134–144)
Sodium: 145 mmol/L (ref 135–145)

## 2017-06-15 LAB — CBC
HCT: 41.9 % (ref 39.0–52.0)
HEMOGLOBIN: 13.1 g/dL (ref 13.0–17.0)
Hematocrit: 44.5 % (ref 37.5–51.0)
Hemoglobin: 14.3 g/dL (ref 13.0–17.7)
MCH: 27.7 pg (ref 26.6–33.0)
MCH: 27.6 pg (ref 26.0–34.0)
MCHC: 32.1 g/dL (ref 31.5–35.7)
MCHC: 31.3 g/dL (ref 30.0–36.0)
MCV: 86 fL (ref 79–97)
MCV: 88.2 fL (ref 78.0–100.0)
PLATELETS: 508 10*3/uL — AB (ref 150–379)
Platelets: 482 10*3/uL — ABNORMAL HIGH (ref 150–400)
RBC: 5.16 x10E6/uL (ref 4.14–5.80)
RBC: 4.75 MIL/uL (ref 4.22–5.81)
RDW: 14.9 % (ref 12.3–15.4)
RDW: 15.5 % (ref 11.5–15.5)
WBC: 12.3 10*3/uL — ABNORMAL HIGH (ref 3.4–10.8)
WBC: 13 10*3/uL — ABNORMAL HIGH (ref 4.0–10.5)

## 2017-06-15 LAB — BRAIN NATRIURETIC PEPTIDE: B Natriuretic Peptide: 2293.2 pg/mL — ABNORMAL HIGH (ref 0.0–100.0)

## 2017-06-15 LAB — HEPATIC FUNCTION PANEL
ALBUMIN: 2.8 g/dL — AB (ref 3.5–5.0)
ALK PHOS: 63 U/L (ref 38–126)
ALT: 35 U/L (ref 17–63)
AST: 37 U/L (ref 15–41)
BILIRUBIN TOTAL: 0.6 mg/dL (ref 0.3–1.2)
Bilirubin, Direct: <0.1 mg/dL — ABNORMAL LOW (ref 0.1–0.5)
TOTAL PROTEIN: 5.9 g/dL — AB (ref 6.5–8.1)

## 2017-06-15 LAB — I-STAT TROPONIN, ED: Troponin i, poc: 0.02 ng/mL (ref 0.00–0.08)

## 2017-06-15 LAB — PRO B NATRIURETIC PEPTIDE: NT-PRO BNP: 23607 pg/mL — AB (ref 0–486)

## 2017-06-15 MED ORDER — CARVEDILOL 12.5 MG PO TABS
12.5000 mg | ORAL_TABLET | Freq: Two times a day (BID) | ORAL | Status: DC
  Administered 2017-06-16 – 2017-06-20 (×9): 12.5 mg via ORAL
  Filled 2017-06-15 (×9): qty 1

## 2017-06-15 MED ORDER — ISOSORBIDE MONONITRATE ER 30 MG PO TB24
15.0000 mg | ORAL_TABLET | Freq: Every evening | ORAL | Status: DC
  Administered 2017-06-16 – 2017-06-19 (×4): 15 mg via ORAL
  Filled 2017-06-15 (×4): qty 1

## 2017-06-15 MED ORDER — ADULT MULTIVITAMIN W/MINERALS CH
1.0000 | ORAL_TABLET | Freq: Two times a day (BID) | ORAL | Status: DC
  Administered 2017-06-16 – 2017-06-20 (×9): 1 via ORAL
  Filled 2017-06-15 (×9): qty 1

## 2017-06-15 MED ORDER — FUROSEMIDE 40 MG PO TABS
40.0000 mg | ORAL_TABLET | Freq: Two times a day (BID) | ORAL | Status: DC
  Administered 2017-06-16: 40 mg via ORAL
  Filled 2017-06-15: qty 1

## 2017-06-15 MED ORDER — MEXILETINE HCL 250 MG PO CAPS
250.0000 mg | ORAL_CAPSULE | Freq: Two times a day (BID) | ORAL | Status: DC
  Administered 2017-06-16 – 2017-06-19 (×7): 250 mg via ORAL
  Filled 2017-06-15 (×8): qty 1

## 2017-06-15 MED ORDER — HYDRALAZINE HCL 25 MG PO TABS
12.5000 mg | ORAL_TABLET | Freq: Two times a day (BID) | ORAL | Status: DC
  Administered 2017-06-16 – 2017-06-20 (×9): 12.5 mg via ORAL
  Filled 2017-06-15 (×11): qty 1

## 2017-06-15 MED ORDER — BENZONATATE 100 MG PO CAPS: 200.0000 mg | ORAL_CAPSULE | Freq: Two times a day (BID) | ORAL | Status: DC | PRN

## 2017-06-15 MED ORDER — FAMOTIDINE 20 MG PO TABS
40.0000 mg | ORAL_TABLET | Freq: Every day | ORAL | Status: DC
  Administered 2017-06-16 – 2017-06-19 (×4): 40 mg via ORAL
  Filled 2017-06-15 (×4): qty 2

## 2017-06-15 MED ORDER — ALBUTEROL SULFATE (2.5 MG/3ML) 0.083% IN NEBU: 3.0000 mL | INHALATION_SOLUTION | Freq: Four times a day (QID) | RESPIRATORY_TRACT | Status: DC | PRN

## 2017-06-15 MED ORDER — PANTOPRAZOLE SODIUM 40 MG PO TBEC
40.0000 mg | DELAYED_RELEASE_TABLET | Freq: Every day | ORAL | Status: DC
  Administered 2017-06-16 – 2017-06-20 (×5): 40 mg via ORAL
  Filled 2017-06-15 (×5): qty 1

## 2017-06-15 MED ORDER — METHIMAZOLE 10 MG PO TABS
10.0000 mg | ORAL_TABLET | Freq: Two times a day (BID) | ORAL | Status: DC
  Administered 2017-06-16 – 2017-06-20 (×9): 10 mg via ORAL
  Filled 2017-06-15 (×9): qty 1

## 2017-06-15 MED ORDER — SODIUM CHLORIDE 0.9% FLUSH
3.0000 mL | Freq: Two times a day (BID) | INTRAVENOUS | Status: DC
  Administered 2017-06-15 – 2017-06-19 (×6): 3 mL via INTRAVENOUS

## 2017-06-15 MED ORDER — MIRTAZAPINE 15 MG PO TABS
15.0000 mg | ORAL_TABLET | Freq: Every day | ORAL | Status: DC
  Administered 2017-06-16 – 2017-06-19 (×4): 15 mg via ORAL
  Filled 2017-06-15 (×4): qty 1

## 2017-06-15 MED ORDER — ACETAMINOPHEN 325 MG PO TABS: 650.0000 mg | ORAL_TABLET | ORAL | Status: DC | PRN

## 2017-06-15 MED ORDER — PREDNISONE 20 MG PO TABS
20.0000 mg | ORAL_TABLET | Freq: Every day | ORAL | Status: DC
  Administered 2017-06-16 – 2017-06-20 (×5): 20 mg via ORAL
  Filled 2017-06-15 (×5): qty 1

## 2017-06-15 MED ORDER — TRAMADOL HCL 50 MG PO TABS
50.0000 mg | ORAL_TABLET | Freq: Two times a day (BID) | ORAL | Status: DC | PRN
  Administered 2017-06-16 – 2017-06-17 (×2): 50 mg via ORAL
  Filled 2017-06-15 (×3): qty 1

## 2017-06-15 MED ORDER — HEPARIN SODIUM (PORCINE) 5000 UNIT/ML IJ SOLN
5000.0000 [IU] | Freq: Three times a day (TID) | INTRAMUSCULAR | Status: DC
  Administered 2017-06-16 – 2017-06-17 (×4): 5000 [IU] via SUBCUTANEOUS
  Filled 2017-06-15 (×4): qty 1

## 2017-06-15 MED ORDER — BOOST PO LIQD: 237.0000 mL | Freq: Three times a day (TID) | ORAL | Status: DC | PRN

## 2017-06-15 MED ORDER — ONDANSETRON 4 MG PO TBDP: 4.0000 mg | ORAL_TABLET | Freq: Three times a day (TID) | ORAL | Status: DC | PRN

## 2017-06-15 MED ORDER — SODIUM CHLORIDE 0.9% FLUSH: 3.0000 mL | INTRAVENOUS | Status: DC | PRN

## 2017-06-15 MED ORDER — SODIUM CHLORIDE 0.9 % IV SOLN: 250.0000 mL | INTRAVENOUS | Status: DC | PRN

## 2017-06-15 MED ORDER — ONDANSETRON HCL 4 MG/2ML IJ SOLN: 4.0000 mg | Freq: Four times a day (QID) | INTRAMUSCULAR | Status: DC | PRN

## 2017-06-15 MED ORDER — ACETAMINOPHEN 500 MG PO TABS: 1000.0000 mg | ORAL_TABLET | Freq: Four times a day (QID) | ORAL | Status: DC | PRN

## 2017-06-15 MED ORDER — OMEGA-3-ACID ETHYL ESTERS 1 G PO CAPS
2.0000 g | ORAL_CAPSULE | Freq: Two times a day (BID) | ORAL | Status: DC
Start: 2017-06-16 — End: 2017-06-20
  Administered 2017-06-16 – 2017-06-20 (×9): 2 g via ORAL
  Filled 2017-06-15 (×9): qty 2

## 2017-06-15 MED ORDER — ATORVASTATIN CALCIUM 20 MG PO TABS
20.0000 mg | ORAL_TABLET | Freq: Every day | ORAL | Status: DC
  Administered 2017-06-16 – 2017-06-19 (×4): 20 mg via ORAL
  Filled 2017-06-15 (×4): qty 1

## 2017-06-15 NOTE — H&P (Signed)
CARDIOLOGY H&P  HPI: [name] is a 84 y.o. male w/ iCM (EF 25%) s/p CABG and ICD, AF, COPD, pHTN, AVR, postural hypotension, hyperthyroidism, and DM presenting with L pleural effusion.  Patient recently admitted to [facility] from 2/17 to 2/26 after presenting with DOE and malaise. During this hospitalization he was treated for VT, acute decompensated heart failure, and large R sided pleural effusion (2L drained by thoracentesis). He was seen by Dr. Radford Pax in follow up on 3/8. A chest CT was performed, which revealed increase in the volume of his large right sided pleural effusion. A BNP was found to be severely elevated (>23,000) as well. The patient was contacted by Dr. Radford Pax and recommended to come to the ED for admission to the heart failure service.   On arrival to the ED, the patient states that he is feeling reasonably well. Since being discharged from his most recent hospitalization, he has experienced continued symptoms of fatigue, malaise, dyspnea on exertion, orthopnea, and nausea. These however he reports as improved compared to prior to the hospitalization. He notes that he was scheduled for an outpatient thoracentesis on Monday. He does not think that his lasix dose has been effective for him. His urine output does not seem to respond to his current dose of 23m BID in the way that it used to.   Review of Systems:     Cardiac Review of Systems: {Y] = yes _0  = no  Chest Pain [    ]  Resting SOB [   ] Exertional SOB  [ Y ]  Orthopnea [ Y ]   Pedal Edema [   ]    Palpitations [  ] Syncope  [  ]   Presyncope [   ]  General Review of Systems: [Y] = yes [  ]=no Constitional: recent weight change [  ]; anorexia [  ]; fatigue [ Y ]; nausea [ Y ]; night sweats [  ]; fever [  ]; or chills [  ];                                                                     Dental: poor dentition[  ];   Eye : blurred vision [  ]; diplopia [   ]; vision changes [  ];  Amaurosis fugax[  ]; Resp: cough [   ];  wheezing[  ];  hemoptysis[  ]; shortness of breath[ Y ]; paroxysmal nocturnal dyspnea[ Y ]; dyspnea on exertion[ Y ]; or orthopnea[ Y ];  GI:  gallstones[  ], vomiting[  ];  dysphagia[  ]; melena[  ];  hematochezia [  ]; heartburn[  ];   GU: kidney stones [  ]; hematuria[  ];   dysuria [  ];  nocturia[  ];               Skin: rash [  ], swelling[  ];, hair loss[  ];  peripheral edema[  ];  or itching[  ]; Musculosketetal: myalgias[  ];  joint swelling[  ];  joint erythema[  ];  joint pain[  ];  back pain[  ];  Heme/Lymph: bruising[  ];  bleeding[  ];  anemia[  ];  Neuro: TIA[  ];  headaches[  ];  stroke[  ];  vertigo[  ];  seizures[  ];   paresthesias[  ];  difficulty walking[  ];  Psych:depression[  ]; anxiety[  ];  Endocrine: diabetes[  ];  thyroid dysfunction[ Y ];  Other:  Past Medical History:  Diagnosis Date  . AICD (automatic cardioverter/defibrillator) present   . Arthritis    "minor everywhere" (04/22/2015)  . Asthma    "a touch"  . Barrett esophagus   . CAD (coronary artery disease)    a. s/p 2 vessel CABG with LIMA to LAD, SVG to diagonal 1, SVG to RCA 12/09.  Marland Kitchen Chronic combined systolic and diastolic CHF (congestive heart failure) (HCC)    dry weight 197-200lbs.  . CKD (chronic kidney disease), stage III ([place])   . Diabetes ([place])    "I'm prediabetic" (04/22/2015)  . Dyspnea   . GERD (gastroesophageal reflux disease)   . H/O: rheumatic fever   . Hepatitis 1957   "don't know what kind:  . History of hiatal hernia   . History of PFTs    a. Amiodarone started 5/16 >> PFTs w/ DLCO 6/16:  FEV1 72% predicted, FEV1/FVC 66%, DLCO 66% >> minimal reversible obstructive airways disease with mild diffusion defect (suggestive of emphysema but absence of hyperinflation inconsistent with dx)  . HTN (hypertension)   . Hypercholesteremia   . NICM (nonischemic cardiomyopathy) ([place])    EF 15-20% by echo 2017  . Orthostatic hypotension   . PAF (paroxysmal atrial fibrillation) ([place])     . Pneumonia 08/2014   "dr thought I may have had a touch"  . S/P AVR (aortic valve replacement) 2009   a. severe AS s/p AVR with pericardial tissue valve 2009.  Marland Kitchen Ventricular tachycardia ([place])     Prior to Admission medications   Medication Sig Start Date End Date Taking? Authorizing Provider  acetaminophen (TYLENOL) 500 MG tablet Take 1,000 mg by mouth every 6 (six) hours as needed for headache (pain).    Yes [provider]  albuterol (PROVENTIL HFA;VENTOLIN HFA) 108 (90 Base) MCG/ACT inhaler Inhale 1-2 puffs into the lungs every 6 (six) hours as needed for wheezing.    Yes [provider]  atorvastatin (LIPITOR) 20 MG tablet TAKE 1 TABLET (20 MG TOTAL) DAILY AT 6 PM. 09/27/16  Yes Turner, Eber Hong, MD  benzonatate (TESSALON) 200 MG capsule Take 1 capsule (200 mg total) by mouth 2 (two) times daily as needed for cough. 06/04/17  Yes Mikhail, Maryann, DO  carvedilol (COREG) 12.5 MG tablet Take 12.5 mg by mouth 2 (two) times daily. 05/17/17  Yes [provider]  ELIQUIS 2.5 MG TABS tablet TAKE 1 TABLET TWICE DAILY Patient taking differently: TAKE 1 TABLET (2.5 MG) BY MOUTH TWICE DAILY 11/02/16  Yes Turner, Eber Hong, MD  furosemide (LASIX) 40 MG tablet Take 1 tablet (40 mg total) by mouth 2 (two) times daily. 06/04/17  Yes Mikhail, Velta Addison, DO  hydrALAZINE (APRESOLINE) 25 MG tablet Take 12.5 mg by mouth 2 (two) times daily.   Yes [provider]  isosorbide mononitrate (IMDUR) 30 MG 24 hr tablet Take 1 tablet (30 mg total) by mouth at bedtime. Patient taking differently: Take 15 mg by mouth every evening.  06/04/17  Yes Mikhail, [place], DO  lactose free nutrition (BOOST) LIQD Take 237 mLs by mouth 3 (three) times daily as needed (meal supplement).   Yes [provider]  methimazole (TAPAZOLE) 10 MG tablet Take 1 tablet (10 mg total) by mouth 2 (  two) times daily. 05/15/17  Yes Philemon Kingdom, MD  mexiletine (MEXITIL) 250 MG capsule Take 1 capsule (250 mg  total) by mouth 2 (two) times daily. 04/18/17  Yes Camnitz, Will Hassell Done, MD  mirtazapine (REMERON) 15 MG tablet Take 1 tablet (15 mg total) by mouth at bedtime. 06/04/17  Yes Mikhail, Velta Addison, DO  Multiple Vitamin (MULTIVITAMIN WITH MINERALS) TABS tablet Take 1 tablet by mouth 2 (two) times daily.    Yes [provider]  omega-3 acid ethyl esters (LOVAZA) 1 G capsule Take 2 g by mouth 2 (two) times daily.   Yes [provider]  ondansetron (ZOFRAN-ODT) 4 MG disintegrating tablet Take 4 mg by mouth every 8 (eight) hours as needed for nausea or vomiting.  06/14/17  Yes [provider]  pantoprazole (PROTONIX) 40 MG tablet Take 1 tablet (40 mg total) by mouth daily before breakfast. 05/10/17  Yes Rai, Ripudeep K, MD  predniSONE (DELTASONE) 10 MG tablet Take 2 tablets (20 mg total) by mouth daily with breakfast. 05/28/17  Yes Philemon Kingdom, MD  ranitidine (ZANTAC) 300 MG tablet Take 300 mg by mouth at bedtime.   Yes [provider]  traMADol (ULTRAM) 50 MG tablet Take 1 tablet (50 mg total) by mouth every 12 (twelve) hours as needed for moderate pain or severe pain. 05/10/17  Yes Rai, Ripudeep K, MD  Amino Acids-Protein Hydrolys (FEEDING SUPPLEMENT, PRO-STAT SUGAR FREE 64,) LIQD Take 30 mLs by mouth daily. Patient not taking: Reported on 06/15/2017 06/05/17   Cristal Ford, DO  feeding supplement, ENSURE ENLIVE, (ENSURE ENLIVE) LIQD Take 237 mLs by mouth 2 (two) times daily between meals. Patient not taking: Reported on 06/15/2017 06/04/17   Cristal Ford, DO     Allergies  Allergen Reactions  . Novocain [Procaine] Swelling    Social History   Socioeconomic History  . Marital status: Married    Spouse name: Not on file  . Number of children: Not on file  . Years of education: Not on file  . Highest education level: Not on file  Social Needs  . Financial resource strain: Not on file  . Food insecurity - worry: Not on file  . Food insecurity - inability: Not  on file  . Transportation needs - medical: Not on file  . Transportation needs - non-medical: Not on file  Occupational History  . Not on file  Tobacco Use  . Smoking status: Never Smoker  . Smokeless tobacco: Never Used  Substance and Sexual Activity  . Alcohol use: Yes    Comment: 04/22/2015 "nothing since the 1980s; never had a problem w/it"  . Drug use: No  . Sexual activity: No  Other Topics Concern  . Not on file  Social History Narrative   Married, lives with spouse [name]   4 children, 2 boys and 2 girls > one daughter passed   OCCUPATION: retired, Pension scheme manager for [employer]    Family History  Problem Relation Age of Onset  . Alzheimer's disease Mother   . COPD Daughter   . Alzheimer's disease Unknown   . COPD Sister   . Depression Sister   . Heart disease Sister   . Hyperlipidemia Sister   . Hypertension Sister   . Esophageal cancer Sister        + smoker    PHYSICAL EXAM: Vitals:   06/15/17 2015 06/15/17 2100  BP: 127/71 129/72  Pulse: 63 69  Resp: (!) 21 (!) 24  Temp:    SpO2: 93%  93%   General:  Chronically ill appearing, yellowing of the skin HEENT: scattered petechiae, OP clear Neck: supple. JVP ~ 4 cm H2O Cor: PMI nondisplaced. Regular rate & rhythm. No rubs, gallops or murmurs. Distant heart sounds Lungs: reduced lung sounds throughout the R lung; L lung with some scattered crackles Abdomen: soft, nontender, nondistended. No hepatosplenomegaly. No bruits or masses. Good bowel sounds. Extremities: cool to the touch, minimal bilateral pitting edema of the lower extremities Neuro: alert & oriented x 3, cranial nerves grossly intact. moves all 4 extremities w/o difficulty. Affect pleasant.  ECG: atrial sensed, ventricular paced rhythm  Results for orders placed or performed during the hospital encounter of 06/15/17 (from the past 24 hour(s))  Basic metabolic panel     Status: Abnormal   Collection Time: 06/15/17  7:31 PM  Result  Value Ref Range   Sodium 145 135 - 145 mmol/L   Potassium 4.4 3.5 - 5.1 mmol/L   Chloride 106 101 - 111 mmol/L   CO2 27 22 - 32 mmol/L   Glucose, Bld 88 65 - 99 mg/dL   BUN 33 (H) 6 - 20 mg/dL   Creatinine, Ser 1.80 (H) 0.61 - 1.24 mg/dL   Calcium 8.4 (L) 8.9 - 10.3 mg/dL   GFR calc non Af Amer 33 (L) >60 mL/min   GFR calc Af Amer 39 (L) >60 mL/min   Anion gap 12 5 - 15  CBC     Status: Abnormal   Collection Time: 06/15/17  7:31 PM  Result Value Ref Range   WBC 13.0 (H) 4.0 - 10.5 K/uL   RBC 4.75 4.22 - 5.81 MIL/uL   Hemoglobin 13.1 13.0 - 17.0 g/dL   HCT 41.9 39.0 - 52.0 %   MCV 88.2 78.0 - 100.0 fL   MCH 27.6 26.0 - 34.0 pg   MCHC 31.3 30.0 - 36.0 g/dL   RDW 15.5 11.5 - 15.5 %   Platelets 482 (H) 150 - 400 K/uL  Brain natriuretic peptide     Status: Abnormal   Collection Time: 06/15/17  7:31 PM  Result Value Ref Range   B Natriuretic Peptide 2,293.2 (H) 0.0 - 100.0 pg/mL  I-stat troponin, ED     Status: None   Collection Time: 06/15/17  8:07 PM  Result Value Ref Range   Troponin i, poc 0.02 0.00 - 0.08 ng/mL   Comment 3           Dg Chest 2 View  Result Date: 06/15/2017 CLINICAL DATA:  Concern for reaccumulation of fluid removed from right lung on Monday. Shortness of breath for the past year. History of asthma, CHF, diabetes, hypertension, coronary artery disease. EXAM: CHEST - 2 VIEW COMPARISON:  CT chest dated 06/14/2017. Most recent chest x-ray dated 06/03/2017. FINDINGS: Heart size is upper normal, stable. Median sternotomy wires appear intact and stable in alignment. Left chest wall pacemaker leads appear stable in position. Aortic atherosclerosis. Right pleural effusion, moderate to large, not significantly changed in amount compared to yesterday's chest CT. Left lung is clear. No pneumothorax seen. No acute or suspicious osseous finding. IMPRESSION: 1. Moderate to large right pleural effusion, not significantly changed in amount compared to yesterday's chest CT. 2. Left  lung is clear. 3. Aortic atherosclerosis. Electronically Signed   By: Franki Cabot M.D.   On: 06/15/2017 19:56   Ct Chest Wo Contrast  Result Date: 06/14/2017 CLINICAL DATA:  Followup lung nodules. EXAM: CT CHEST WITHOUT CONTRAST TECHNIQUE: Multidetector CT imaging of the chest  was performed following the standard protocol without IV contrast. COMPARISON:  CT chest 05/26/2017 FINDINGS: Cardiovascular: There is a left chest wall ICD with leads in the right atrial appendage and right ventricle. Previous CABG. Mild cardiac enlargement. Aortic atherosclerosis. The main pulmonary artery measures 3.9 cm. Mediastinum/Nodes: The thyroid gland is normal. The trachea appears patent and is midline. Normal appearance of the esophagus. AP window lymph node measures 1.1 cm, image 63/2. Previously 1.5 cm. Stable right paratracheal lymph node measuring 9 mm, image 60/2. Lungs/Pleura: Large right pleural effusion appears stable to slightly increased in volume from previous exam. Small left pleural effusion is slightly decreased in volume from previous exam. No airspace consolidation. No pneumothorax. Small granuloma identified within the right upper lobe. Most of the previously noted pulmonary nodules have resolved in the interval. Stable 3 mm left lower lobe nodule, image 93/3. New from the previous exam is a small noncalcified nodule within the periphery of the right upper lobe measuring 3 mm, image 52/3. Upper Abdomen: No acute abnormality. Tiny stones within the dependent portion of gallbladder. Musculoskeletal: Degenerative disc disease identified. Degenerative changes within the thoracic spine. IMPRESSION: 1. Most of the previously noted index nodules are not confidently identified on today's study and were presumably postinflammatory. There is a single previously noted 3 mm left lower lobe lung nodule which is stable. 2. 3 mm right upper lobe lung nodule was not identified on prior exams. No follow-up needed if patient is  low-risk. Non-contrast chest CT can be considered in 12 months if patient is high-risk. This recommendation follows the consensus statement: Guidelines for Management of Incidental Pulmonary Nodules Detected on CT Images: From the Fleischner Society 2017; Radiology 2017; 284:228-243. 3. Stable to mild increase in volume of large right pleural effusion. 4. Decrease in size of previously enlarged AP window lymph node. 5.  Aortic Atherosclerosis (ICD10-I70.0). 6. Enlargement of the main pulmonary artery suggestive of PA hypertension. Electronically Signed   By: Kerby Moors M.D.   On: 06/14/2017 10:17    ASSESSMENT: [name] is a 84 y.o. male w/ iCM (EF 25%) s/p CABG and ICD, AF, COPD, pHTN, AVR, postural hypotension, hyperthyroidism, and DM presenting with R sided pleural effusion and chronic symptoms of fatigue, malaise, orthopnea, dyspnea on exertion, and nausea.   PLAN/DISCUSSION: #) R pleural effusion: large effusion increasing in size compared to prior evaluation - hold apixaban for now - patient will likely need repeat thoracentesis - may need pleurx placement eventually for intermittent home drainage  #) Chronic systolic/diastolic heart failure: does not appear to have signs of right sided volume overload on presentation given low/normal JVP. Cool extremities concerning, will eval for signs of reduced peripheral perfusion. RHC from last hospitalization w/ normal RA pressure, high PCWP. - check lactate, LFTs - cont lasix 2m BID for now, would likely benefit from dose increase, switch to torsemide, or addition of metolazone - cont home carvedilol, hydralazine, imdur  #) AF: high risk for stroke (CHADS2-VASc score 5); last dose of AC was the evening of 3/9 - will hold on initiating heparin bridge tonight, will need to decide in AM whether heparin bridge is indicated based on timing of thoracentesis - hold apixaban as per above  #) Hyperthyroidism: amiodarone induced - cont home  prednisone, methimazole  AMarcie Mowers MD Cardiology Fellow, PGY-5

## 2017-06-15 NOTE — Telephone Encounter (Signed)
BNP is severely elevated and patient's chest CT came back with reaccumulated large left pleural effusion.  He also has worsening renal function.  I have called patient and instructed him to go to [facility] ER to be admitted to advanced HF service. Patient voiced understanding and will go to ER.

## 2017-06-15 NOTE — ED Triage Notes (Signed)
Pt presents to ED per PCP who states patient's BNP is "greater than 23,000".  Patient c/o SOB, and recently having fluid drained from his right lung.  Pt has stage III kidney failure.  Recently increased his diuretic.

## 2017-06-15 NOTE — ED Provider Notes (Signed)
[facility] Provider Note   CSN: [id number] Arrival date & time: 06/15/17  1913     History   Chief Complaint Chief Complaint  Patient presents with  . Shortness of Breath    HPI [name] is a 84 y.o. male.  HPI   Has had 3-4 months of shortness of breath, felt worse on last 2 admissions, was admitted 2/17. Reports compared to then, he is feeling improved, Reports feeling better yesterday afternoon after visit with Dr. Radford Pax.  Does report dyspnea with exertion at this time.  Reports very little leg swelling at [facility] spoint.  Reports lightheadedness when going up and down, feels like this has been worse over the last week.  No nasal congestion.  Cough for a few weeks.   Past Medical History:  Diagnosis Date  . AICD (automatic cardioverter/defibrillator) present   . Arthritis    "minor everywhere" (04/22/2015)  . Asthma    "a touch"  . Barrett esophagus   . CAD (coronary artery disease)    a. s/p 2 vessel CABG with LIMA to LAD, SVG to diagonal 1, SVG to RCA 12/09.  Marland Kitchen Chronic combined systolic and diastolic CHF (congestive heart failure) (HCC)    dry weight 197-200lbs.  . CKD (chronic kidney disease), stage III ([place])   . Diabetes ([place])    "I'm prediabetic" (04/22/2015)  . Dyspnea   . GERD (gastroesophageal reflux disease)   . H/O: rheumatic fever   . Hepatitis 1957   "don't know what kind:  . History of hiatal hernia   . History of PFTs    a. Amiodarone started 5/16 >> PFTs w/ DLCO 6/16:  FEV1 72% predicted, FEV1/FVC 66%, DLCO 66% >> minimal reversible obstructive airways disease with mild diffusion defect (suggestive of emphysema but absence of hyperinflation inconsistent with dx)  . HTN (hypertension)   . Hypercholesteremia   . NICM (nonischemic cardiomyopathy) ([place])    EF 15-20% by echo 2017  . Orthostatic hypotension   . PAF (paroxysmal atrial fibrillation) ([place])   . Pneumonia 08/2014   "dr thought I may have had a touch"  . S/P AVR  (aortic valve replacement) 2009   a. severe AS s/p AVR with pericardial tissue valve 2009.  Marland Kitchen Ventricular tachycardia [facility])     Patient Active Problem List   Diagnosis Date Noted  . Ischemic cardiomyopathy 06/16/2017  . History of aortic valve replacement with bioprosthetic valve   . Cardiac resynchronization therapy defibrillator (CRT-D) in place   . Protein-calorie malnutrition, severe 05/27/2017  . Acute on chronic combined systolic and diastolic CHF (congestive heart failure) ([place]) 05/26/2017  . Hyperthyroidism 05/26/2017  . Nausea 05/26/2017  . Cough 05/26/2017  . Pleural effusion 05/26/2017  . Malnutrition of moderate degree 05/09/2017  . GI bleed 05/08/2017  . Acute GI bleeding 05/08/2017  . VT (ventricular tachycardia) ([place]) 05/06/2017  . Dizziness 04/23/2017  . Weight loss 04/23/2017  . Leg weakness 01/28/2017  . Abnormal nuclear stress test: HIGH RISK 11/04/2014  . S/P AVR (aortic valve replacement) 09/02/2014  . Chronic combined systolic and diastolic heart failure ([place])   . Shingles 08/23/2014  . CKD (chronic kidney disease) stage 3, GFR 30-59 ml/min (HCC) 08/23/2014  . Hyperkalemia 08/23/2014  . Aortic stenosis, severe 01/23/2013  . Essential hypertension 01/23/2013  . Dyslipidemia 01/23/2013  . Coronary artery disease 01/23/2013  . Diastolic dysfunction 40/98/1191  . Persistent atrial fibrillation ([place]) 01/23/2013  . Chronic anticoagulation 01/23/2013    Past Surgical  History:  Procedure Laterality Date  . AORTIC VALVE REPLACEMENT  with 23-mm Magna Ease pericardial valve, model number   with 23-mm Magna Ease pericardial valve, model [id number], serial number [id number]   . APPENDECTOMY  1940s  . BI-VENTRICULAR IMPLANTABLE CARDIOVERTER DEFIBRILLATOR  (CRT-D)  04/22/2015  . CARDIAC CATHETERIZATION N/A 11/04/2014   Procedure: Left Heart Cath and Cors/Grafts Angiography;  Surgeon: Leonie Man, MD;  Location: [facility];  Service: Cardiovascular;   Laterality: N/A;  . CARDIAC VALVE REPLACEMENT    . CARDIOVERSION N/A 01/27/2013   Procedure: CARDIOVERSION;  Surgeon: Sueanne Margarita, MD;  Location: [place];  Service: Cardiovascular;  Laterality: N/A;  . CARDIOVERSION N/A 08/25/2014   Procedure: CARDIOVERSION;  Surgeon: Sanda Klein, MD;  Location: [facility];  Service: Cardiovascular;  Laterality: N/A;  . CATARACT EXTRACTION W/ INTRAOCULAR LENS  IMPLANT, BILATERAL Bilateral   . CORONARY ARTERY BYPASS GRAFT  03/10/2008   x 3 Dr. Roxan Hockey  . EP IMPLANTABLE DEVICE N/A 04/22/2015   Procedure: BiV ICD Insertion CRT-D;  Surgeon: [name], MD;  Location: [facility] [facility];  Service: Cardiovascular;  Laterality: N/A;  . ESOPHAGOGASTRODUODENOSCOPY (EGD) WITH ESOPHAGEAL DILATION  X1  . ESOPHAGOGASTRODUODENOSCOPY (EGD) WITH PROPOFOL N/A 05/09/2017   Procedure: ESOPHAGOGASTRODUODENOSCOPY (EGD) WITH PROPOFOL;  Surgeon: Carol Ada, MD;  Location: [place];  Service: Endoscopy;  Laterality: N/A;  . IR THORACENTESIS ASP PLEURAL SPACE W/IMG GUIDE  05/27/2017  . RIGHT HEART CATH AND CORONARY/GRAFT ANGIOGRAPHY N/A 05/31/2017   Procedure: RIGHT HEART CATH AND CORONARY/GRAFT ANGIOGRAPHY;  Surgeon: Jolaine Artist, MD;  Location: [place] [facility];  Service: Cardiovascular;  Laterality: N/A;  . TONSILLECTOMY         Home Medications    Prior to Admission medications   Medication Sig Start Date End Date Taking? Authorizing Provider  acetaminophen (TYLENOL) 500 MG tablet Take 1,000 mg by mouth every 6 (six) hours as needed for headache (pain).    Yes [provider]  albuterol (PROVENTIL HFA;VENTOLIN HFA) 108 (90 Base) MCG/ACT inhaler Inhale 1-2 puffs into the lungs every 6 (six) hours as needed for wheezing.    Yes [provider]  atorvastatin (LIPITOR) 20 MG tablet TAKE 1 TABLET (20 MG TOTAL) DAILY AT 6 PM. 09/27/16  Yes Turner, Eber Hong, MD  benzonatate (TESSALON) 200 MG capsule Take 1 capsule (200 mg total)  by mouth 2 (two) times daily as needed for cough. 06/04/17  Yes Mikhail, Maryann, DO  carvedilol (COREG) 12.5 MG tablet Take 12.5 mg by mouth 2 (two) times daily. 05/17/17  Yes [provider]  ELIQUIS 2.5 MG TABS tablet TAKE 1 TABLET TWICE DAILY Patient taking differently: TAKE 1 TABLET (2.5 MG) BY MOUTH TWICE DAILY 11/02/16  Yes Turner, Eber Hong, MD  furosemide (LASIX) 40 MG tablet Take 1 tablet (40 mg total) by mouth 2 (two) times daily. 06/04/17  Yes Mikhail, Velta Addison, DO  hydrALAZINE (APRESOLINE) 25 MG tablet Take 12.5 mg by mouth 2 (two) times daily.   Yes [provider]  isosorbide mononitrate (IMDUR) 30 MG 24 hr tablet Take 1 tablet (30 mg total) by mouth at bedtime. Patient taking differently: Take 15 mg by mouth every evening.  06/04/17  Yes Mikhail, [place], DO  lactose free nutrition (BOOST) LIQD Take 237 mLs by mouth 3 (three) times daily as needed (meal supplement).   Yes [provider]  methimazole (TAPAZOLE) 10 MG tablet Take 1 tablet (10 mg total) by mouth 2 (two) times daily. 05/15/17  Yes Philemon Kingdom, MD  mexiletine (MEXITIL) 250 MG capsule Take 1 capsule (250 mg total) by mouth 2 (two) times daily. 04/18/17  Yes Camnitz, Will Hassell Done, MD  mirtazapine (REMERON) 15 MG tablet Take 1 tablet (15 mg total) by mouth at bedtime. 06/04/17  Yes Mikhail, Velta Addison, DO  Multiple Vitamin (MULTIVITAMIN WITH MINERALS) TABS tablet Take 1 tablet by mouth 2 (two) times daily.    Yes [provider]  omega-3 acid ethyl esters (LOVAZA) 1 G capsule Take 2 g by mouth 2 (two) times daily.   Yes [provider]  ondansetron (ZOFRAN-ODT) 4 MG disintegrating tablet Take 4 mg by mouth every 8 (eight) hours as needed for nausea or vomiting.  06/14/17  Yes [provider]  pantoprazole (PROTONIX) 40 MG tablet Take 1 tablet (40 mg total) by mouth daily before breakfast. 05/10/17  Yes Rai, Ripudeep K, MD  predniSONE (DELTASONE) 10 MG tablet Take 2 tablets (20 mg total)  by mouth daily with breakfast. 05/28/17  Yes Philemon Kingdom, MD  ranitidine (ZANTAC) 300 MG tablet Take 300 mg by mouth at bedtime.   Yes [provider]  traMADol (ULTRAM) 50 MG tablet Take 1 tablet (50 mg total) by mouth every 12 (twelve) hours as needed for moderate pain or severe pain. 05/10/17  Yes Rai, Ripudeep K, MD  Amino Acids-Protein Hydrolys (FEEDING SUPPLEMENT, PRO-STAT SUGAR FREE 64,) LIQD Take 30 mLs by mouth daily. Patient not taking: Reported on 06/15/2017 06/05/17   Cristal Ford, DO  feeding supplement, ENSURE ENLIVE, (ENSURE ENLIVE) LIQD Take 237 mLs by mouth 2 (two) times daily between meals. Patient not taking: Reported on 06/15/2017 06/04/17   Cristal Ford, DO    Family History Family History  Problem Relation Age of Onset  . Alzheimer's disease Mother   . COPD Daughter   . Alzheimer's disease Unknown   . COPD Sister   . Depression Sister   . Heart disease Sister   . Hyperlipidemia Sister   . Hypertension Sister   . Esophageal cancer Sister        + smoker    Social History Social History   Tobacco Use  . Smoking status: Never Smoker  . Smokeless tobacco: Never Used  Substance Use Topics  . Alcohol use: Yes    Comment: 04/22/2015 "nothing since the 1980s; never had a problem w/it"  . Drug use: No     Allergies   Novocain [procaine]   Review of Systems Review of Systems  Constitutional: Negative for fever.  HENT: Negative for sore throat.   Eyes: Negative for visual disturbance.  Respiratory: Positive for cough and shortness of breath.   Cardiovascular: Positive for leg swelling. Negative for chest pain.  Gastrointestinal: Negative for abdominal pain, nausea and vomiting.  Genitourinary: Negative for difficulty urinating.  Musculoskeletal: Negative for back pain and neck stiffness.  Skin: Negative for rash.  Neurological: Positive for light-headedness. Negative for syncope and headaches.     Physical Exam Updated Vital Signs BP  124/71 (BP Location: Left Arm)   Pulse 69   Temp 97.9 F (36.6 C) (Oral)   Resp 18   Ht 6' (1.829 m)   Wt 80.6 kg (177 lb 11.2 oz)   SpO2 93%   BMI 24.10 kg/m   Physical Exam  Constitutional: He is oriented to person, place, and time. He appears well-developed and well-nourished. No distress.  HENT:  Head: Normocephalic and atraumatic.  Eyes: Conjunctivae and EOM are normal.  Neck: Normal range of motion.  Cardiovascular: Normal rate, regular rhythm, normal heart sounds and intact distal pulses. Exam reveals no gallop and no friction rub.  No murmur heard. Pulmonary/Chest: Effort normal. No respiratory distress. He has decreased breath sounds (diminished breath sounds on the right). He has no wheezes. He has no rales.  Abdominal: Soft. He exhibits no distension. There is no tenderness. There is no guarding.  Musculoskeletal: He exhibits no edema.  Neurological: He is alert and oriented to person, place, and time.  Skin: Skin is warm and dry. He is not diaphoretic.  Nursing note and vitals reviewed.    ED Treatments / Results  Labs (all labs ordered are listed, but only abnormal results are displayed) Labs Reviewed  BASIC METABOLIC PANEL - Abnormal; Notable for the following components:      Result Value   BUN 33 (*)    Creatinine, Ser 1.80 (*)    Calcium 8.4 (*)    GFR calc non Af Amer 33 (*)    GFR calc Af Amer 39 (*)    All other components within normal limits  CBC - Abnormal; Notable for the following components:   WBC 13.0 (*)    Platelets 482 (*)    All other components within normal limits  BRAIN NATRIURETIC PEPTIDE - Abnormal; Notable for the following components:   B Natriuretic Peptide 2,293.2 (*)    All other components within normal limits  HEPATIC FUNCTION PANEL - Abnormal; Notable for the following components:   Total Protein 5.9 (*)    Albumin 2.8 (*)    Bilirubin, Direct <0.1 (*)    All other components within normal limits  BASIC METABOLIC PANEL -  Abnormal; Notable for the following components:   Potassium 3.2 (*)    Glucose, Bld 159 (*)    BUN 31 (*)    Creatinine, Ser 1.67 (*)    Calcium 7.8 (*)    GFR calc non Af Amer 37 (*)    GFR calc Af Amer 42 (*)    All other components within normal limits  CBC - Abnormal; Notable for the following components:   WBC 11.7 (*)    Hemoglobin 11.9 (*)    HCT 38.1 (*)    RDW 15.6 (*)    All other components within normal limits  MRSA PCR SCREENING  LACTIC ACID, PLASMA  I-STAT TROPONIN, ED    EKG  EKG Interpretation  Date/Time:  Saturday June 15 2017 19:27:13 EST Ventricular Rate:  68 PR Interval:  128 QRS Duration: 158 QT Interval:  502 QTC Calculation: 533 R Axis:   -79 Text Interpretation:  Atrial-sensed ventricular-paced rhythm Abnormal ECG No significant change since last tracing Confirmed by Gareth Morgan [phone]) on 06/15/2017 8:12:53 PM       Radiology Dg Chest 2 View  Result Date: 06/15/2017 CLINICAL DATA:  Concern for reaccumulation of fluid removed from right lung on Monday. Shortness of breath for the past year. History of asthma, CHF, diabetes, hypertension, coronary artery disease. EXAM: CHEST - 2 VIEW COMPARISON:  CT chest dated 06/14/2017. Most recent chest x-ray dated 06/03/2017. FINDINGS: Heart size is upper normal, stable. Median sternotomy wires appear intact and stable in alignment. Left chest wall pacemaker leads appear stable in position. Aortic atherosclerosis. Right pleural effusion, moderate to large, not significantly changed in amount compared to yesterday's chest CT. Left lung is clear. No pneumothorax seen. No acute or suspicious osseous finding. IMPRESSION: 1. Moderate to large right pleural effusion, not significantly changed in amount compared to yesterday's  chest CT. 2. Left lung is clear. 3. Aortic atherosclerosis. Electronically Signed   By: Franki Cabot M.D.   On: 06/15/2017 19:56    Procedures Procedures (including critical care  time)  Medications Ordered in ED Medications  acetaminophen (TYLENOL) tablet 1,000 mg (not administered)  albuterol (PROVENTIL) (2.5 MG/3ML) 0.083% nebulizer solution 3 mL (not administered)  atorvastatin (LIPITOR) tablet 20 mg (not administered)  benzonatate (TESSALON) capsule 200 mg (not administered)  carvedilol (COREG) tablet 12.5 mg (12.5 mg Oral Given 06/16/17 0935)  hydrALAZINE (APRESOLINE) tablet 12.5 mg (12.5 mg Oral Given 06/16/17 0934)  isosorbide mononitrate (IMDUR) 24 hr tablet 15 mg (not administered)  lactose free nutrition (Boost) liquid 237 mL (not administered)  methimazole (TAPAZOLE) tablet 10 mg (10 mg Oral Given 06/16/17 0934)  mexiletine (MEXITIL) capsule 250 mg (250 mg Oral Given 06/16/17 0935)  mirtazapine (REMERON) tablet 15 mg (not administered)  multivitamin with minerals tablet 1 tablet (1 tablet Oral Given 06/16/17 0935)  omega-3 acid ethyl esters (LOVAZA) capsule 2 g (2 g Oral Given 06/16/17 0935)  ondansetron (ZOFRAN-ODT) disintegrating tablet 4 mg (not administered)  pantoprazole (PROTONIX) EC tablet 40 mg (40 mg Oral Given 06/16/17 0934)  predniSONE (DELTASONE) tablet 20 mg (20 mg Oral Given 06/16/17 0935)  famotidine (PEPCID) tablet 40 mg (not administered)  traMADol (ULTRAM) tablet 50 mg (50 mg Oral Given 06/16/17 0956)  sodium chloride flush (NS) 0.9 % injection 3 mL (3 mLs Intravenous Given 06/16/17 0938)  sodium chloride flush (NS) 0.9 % injection 3 mL (not administered)  0.9 %  sodium chloride infusion (not administered)  heparin injection 5,000 Units (5,000 Units Subcutaneous Given 06/16/17 0641)  furosemide (LASIX) injection 40 mg (not administered)  potassium chloride SA (K-DUR,KLOR-CON) CR tablet 40 mEq (40 mEq Oral Given 06/16/17 9977)     Initial Impression / Assessment and Plan / ED Course  I have reviewed the triage vital signs and the nursing notes.  Pertinent labs & imaging results that were available during my care of the patient were reviewed  by me and considered in my medical decision making (see chart for details).     84yo male sent by Dr. Radford Pax with concern for worsening CKD, CHF, pleural effusion. BNP elevated, pt with fatigue, worsening orthopnea. No sign of acute infection.  I have reviewed the imaging and labs, consistent with CHF.  Will admit to Cardiology advanced heart failure service for further care.   Final Clinical Impressions(s) / ED Diagnoses   Final diagnoses:  Pleural effusion  Chronic congestive heart failure, unspecified heart failure type [facility])    ED Discharge Orders    None       Gareth Morgan, MD 06/16/17 1415

## 2017-06-16 DIAGNOSIS — I420 Dilated cardiomyopathy: Secondary | ICD-10-CM | POA: Insufficient documentation

## 2017-06-16 DIAGNOSIS — I5043 Acute on chronic combined systolic (congestive) and diastolic (congestive) heart failure: Secondary | ICD-10-CM

## 2017-06-16 DIAGNOSIS — J90 Pleural effusion, not elsewhere classified: Secondary | ICD-10-CM

## 2017-06-16 LAB — CBC
HEMATOCRIT: 38.1 % — AB (ref 39.0–52.0)
HEMOGLOBIN: 11.9 g/dL — AB (ref 13.0–17.0)
MCH: 27.5 pg (ref 26.0–34.0)
MCHC: 31.2 g/dL (ref 30.0–36.0)
MCV: 88.2 fL (ref 78.0–100.0)
Platelets: 376 10*3/uL (ref 150–400)
RBC: 4.32 MIL/uL (ref 4.22–5.81)
RDW: 15.6 % — ABNORMAL HIGH (ref 11.5–15.5)
WBC: 11.7 10*3/uL — AB (ref 4.0–10.5)

## 2017-06-16 LAB — BASIC METABOLIC PANEL
Anion gap: 10 (ref 5–15)
BUN: 31 mg/dL — ABNORMAL HIGH (ref 6–20)
CALCIUM: 7.8 mg/dL — AB (ref 8.9–10.3)
CO2: 27 mmol/L (ref 22–32)
Chloride: 104 mmol/L (ref 101–111)
Creatinine, Ser: 1.67 mg/dL — ABNORMAL HIGH (ref 0.61–1.24)
GFR, EST AFRICAN AMERICAN: 42 mL/min — AB (ref >60)
GFR, EST NON AFRICAN AMERICAN: 37 mL/min — AB (ref >60)
GLUCOSE: 159 mg/dL — AB (ref 65–99)
POTASSIUM: 3.2 mmol/L — AB (ref 3.5–5.1)
SODIUM: 141 mmol/L (ref 135–145)

## 2017-06-16 LAB — MRSA PCR SCREENING: MRSA BY PCR: NEGATIVE

## 2017-06-16 LAB — LACTIC ACID, PLASMA: LACTIC ACID, VENOUS: 1.8 mmol/L (ref 0.5–1.9)

## 2017-06-16 MED ORDER — FUROSEMIDE 10 MG/ML IJ SOLN
40.0000 mg | Freq: Two times a day (BID) | INTRAMUSCULAR | Status: DC
  Administered 2017-06-16 – 2017-06-17 (×2): 40 mg via INTRAVENOUS
  Filled 2017-06-16 (×2): qty 4

## 2017-06-16 MED ORDER — POTASSIUM CHLORIDE CRYS ER 20 MEQ PO TBCR
40.0000 meq | EXTENDED_RELEASE_TABLET | Freq: Once | ORAL | Status: AC
  Administered 2017-06-16: 40 meq via ORAL
  Filled 2017-06-16: qty 2

## 2017-06-16 NOTE — Progress Notes (Signed)
Progress Note  Patient Name: [name] Date of Encounter: 06/16/2017  Primary Cardiologist: [name], MD   Subjective   84 year old gentleman with a history of ischemic cardia myopathy with an ejection fraction of 25%.  He status post coronary artery bypass grafting and ICD placement.  Has history of atrial fibrillation, COPD, pulmonary hypertension, aortic valve replacement.  He presents with shortness of breath and was found to have a left pleural effusion.  Patient was recently hospitalized 2 weeks ago for congestive heart failure and a large right pleural effusion.  He had a thoracentesis with drainage of 2 L of pleural fluid.  Fluid analysis was unremarkable.  Inpatient Medications    Scheduled Meds: . atorvastatin  20 mg Oral q1800  . carvedilol  12.5 mg Oral BID WC  . famotidine  40 mg Oral QHS  . furosemide  40 mg Oral BID  . heparin  5,000 Units Subcutaneous Q8H  . hydrALAZINE  12.5 mg Oral BID  . isosorbide mononitrate  15 mg Oral QPM  . methimazole  10 mg Oral BID  . mexiletine  250 mg Oral BID  . mirtazapine  15 mg Oral QHS  . multivitamin with minerals  1 tablet Oral BID  . omega-3 acid ethyl esters  2 g Oral BID  . pantoprazole  40 mg Oral QAC breakfast  . predniSONE  20 mg Oral Q breakfast  . sodium chloride flush  3 mL Intravenous Q12H   Continuous Infusions: . sodium chloride     PRN Meds: sodium chloride, acetaminophen, albuterol, benzonatate, lactose free nutrition, ondansetron, sodium chloride flush, traMADol   Vital Signs    Vitals:   06/15/17 2335 06/16/17 0509 06/16/17 0830 06/16/17 0934  BP: (!) 147/79 (!) 142/63 128/64 (!) 130/57  Pulse: 71 65 70 65  Resp: _0 Temp: 98.1 F (36.7 C) 97.8 F (36.6 C) 97.8 F (36.6 C)   TempSrc: Oral Oral Oral   SpO2: 98% 93% 95% 95%  Weight: 177 lb 11.2 oz (80.6 kg)     Height: 6' (1.829 m)       Intake/Output Summary (Last 24 hours) at 06/16/2017 0956 Last data filed at 06/16/2017  2233 Gross per 24 hour  Intake 460 ml  Output 225 ml  Net 235 ml   Filed Weights   06/15/17 2335  Weight: 177 lb 11.2 oz (80.6 kg)    Telemetry    V paced at 70 - Personally Reviewed  ECG    V paced  - Personally Reviewed  Physical Exam   GEN:  Elderly gentleman, no acute distress.  Able to lie supine without too much difficulty. Neck: No JVD Cardiac: RRR, no murmurs, rubs, or gallops.  Respiratory:  Greatly diminished breath sounds on the right side three quarters of the way up. GI: Soft, nontender, non-distended  MS: No edema;  Neuro:  Nonfocal  Psych: Normal affect   Labs    Chemistry Recent Labs  Lab 06/14/17 1234 06/15/17 1931 06/16/17 0309  NA 144 145 141  K 4.1 4.4 3.2*  CL 101 106 104  CO2 _1 GLUCOSE 153* 88 159*  BUN 34* 33* 31*  CREATININE 1.89* 1.80* 1.67*  CALCIUM 8.6 8.4* 7.8*  PROT  --  5.9*  --   ALBUMIN  --  2.8*  --   AST  --  37  --   ALT  --  35  --   ALKPHOS  --  63  --   BILITOT  --  0.6  --   GFRNONAA 32* 33* 37*  GFRAA 37* 39* 42*  ANIONGAP  --  12 10     Hematology Recent Labs  Lab 06/14/17 1234 06/15/17 1931 06/16/17 0309  WBC 12.3* 13.0* 11.7*  RBC 5.16 4.75 4.32  HGB 14.3 13.1 11.9*  HCT 44.5 41.9 38.1*  MCV 86 88.2 88.2  MCH 27.7 27.6 27.5  MCHC 32.1 31.3 31.2  RDW 14.9 15.5 15.6*  PLT 508* 482* 376    Cardiac EnzymesNo results for input(s): TROPONINI in the last 168 hours.  Recent Labs  Lab 06/15/17 2007  TROPIPOC 0.02     BNP Recent Labs  Lab 06/14/17 1234 06/15/17 1931  BNP  --  2,293.2*  PROBNP 23,607*  --      DDimer No results for input(s): DDIMER in the last 168 hours.   Radiology    Dg Chest 2 View  Result Date: 06/15/2017 CLINICAL DATA:  Concern for reaccumulation of fluid removed from right lung on Monday. Shortness of breath for the past year. History of asthma, CHF, diabetes, hypertension, coronary artery disease. EXAM: CHEST - 2 VIEW COMPARISON:  CT chest dated 06/14/2017.  Most recent chest x-ray dated 06/03/2017. FINDINGS: Heart size is upper normal, stable. Median sternotomy wires appear intact and stable in alignment. Left chest wall pacemaker leads appear stable in position. Aortic atherosclerosis. Right pleural effusion, moderate to large, not significantly changed in amount compared to yesterday's chest CT. Left lung is clear. No pneumothorax seen. No acute or suspicious osseous finding. IMPRESSION: 1. Moderate to large right pleural effusion, not significantly changed in amount compared to yesterday's chest CT. 2. Left lung is clear. 3. Aortic atherosclerosis. Electronically Signed   By: Franki Cabot M.D.   On: 06/15/2017 19:56   Ct Chest Wo Contrast  Result Date: 06/14/2017 CLINICAL DATA:  Followup lung nodules. EXAM: CT CHEST WITHOUT CONTRAST TECHNIQUE: Multidetector CT imaging of the chest was performed following the standard protocol without IV contrast. COMPARISON:  CT chest 05/26/2017 FINDINGS: Cardiovascular: There is a left chest wall ICD with leads in the right atrial appendage and right ventricle. Previous CABG. Mild cardiac enlargement. Aortic atherosclerosis. The main pulmonary artery measures 3.9 cm. Mediastinum/Nodes: The thyroid gland is normal. The trachea appears patent and is midline. Normal appearance of the esophagus. AP window lymph node measures 1.1 cm, image 63/2. Previously 1.5 cm. Stable right paratracheal lymph node measuring 9 mm, image 60/2. Lungs/Pleura: Large right pleural effusion appears stable to slightly increased in volume from previous exam. Small left pleural effusion is slightly decreased in volume from previous exam. No airspace consolidation. No pneumothorax. Small granuloma identified within the right upper lobe. Most of the previously noted pulmonary nodules have resolved in the interval. Stable 3 mm left lower lobe nodule, image 93/3. New from the previous exam is a small noncalcified nodule within the periphery of the right upper  lobe measuring 3 mm, image 52/3. Upper Abdomen: No acute abnormality. Tiny stones within the dependent portion of gallbladder. Musculoskeletal: Degenerative disc disease identified. Degenerative changes within the thoracic spine. IMPRESSION: 1. Most of the previously noted index nodules are not confidently identified on today's study and were presumably postinflammatory. There is a single previously noted 3 mm left lower lobe lung nodule which is stable. 2. 3 mm right upper lobe lung nodule was not identified on prior exams. No follow-up needed if patient is low-risk. Non-contrast chest CT can be considered in  12 months if patient is high-risk. This recommendation follows the consensus statement: Guidelines for Management of Incidental Pulmonary Nodules Detected on CT Images: From the Fleischner Society 2017; Radiology 2017; 284:228-243. 3. Stable to mild increase in volume of large right pleural effusion. 4. Decrease in size of previously enlarged AP window lymph node. 5.  Aortic Atherosclerosis (ICD10-I70.0). 6. Enlargement of the main pulmonary artery suggestive of PA hypertension. Electronically Signed   By: Kerby Moors M.D.   On: 06/14/2017 10:17    Cardiac Studies     Patient Profile     84 y.o. male with recurrent right pleural effusions.  He was just discharged from the hospital several weeks ago for having thoracentesis of his pleural effusion.  Assessment & Plan    1.  Recurrent right pleural effusion.  The patient has known congestive heart failure.  He is on adequate diuretics.  Ejection fraction is 20-25%.  He has grade 2 diastolic dysfunction.  Right and left heart catheterization performed on February 22 reveals it to severe pulmonary hypertension with an estimated PA pressure of 69/30 with a mean of 46.  His pulmonary   wedge pressure was 37. Will try to diurese him with IV lasix  Will consult pulmonary for input   2.  Coronary artery disease: Status post coronary artery bypass  grafting.  He does not have any angina.  3.  Acute on chronic combined systolic and diastolic congestive heart failure: His pulmonary capillary wedge pressure at the time of heart catheterization was increased.  We will try IV diuretics.  I suspect that he will need thoracentesis for his pleural effusion.   For questions or updates, please contact [name] Please consult [email or web address] for contact info under Cardiology/STEMI.      Signed, Mertie Moores, MD  06/16/2017, 9:56 AM

## 2017-06-16 NOTE — Plan of Care (Signed)
  Progressing Activity: Capacity to carry out activities will improve 06/16/2017 0527 - Progressing by Ruben Im, RN Cardiac: Ability to achieve and maintain adequate cardiopulmonary perfusion will improve 06/16/2017 0527 - Progressing by Ruben Im, RN

## 2017-06-16 NOTE — Progress Notes (Signed)
CCMD called- patient had a 12 beat run of V-tach-   Asymptomatic.  Doctor made aware

## 2017-06-17 ENCOUNTER — Encounter (HOSPITAL_COMMUNITY): Payer: Self-pay | Admitting: Student

## 2017-06-17 ENCOUNTER — Ambulatory Visit: Payer: Medicare HMO | Admitting: Pulmonary Disease

## 2017-06-17 ENCOUNTER — Inpatient Hospital Stay (HOSPITAL_COMMUNITY): Payer: Medicare HMO

## 2017-06-17 HISTORY — PX: IR THORACENTESIS ASP PLEURAL SPACE W/IMG GUIDE: IMG5380

## 2017-06-17 LAB — BODY FLUID CELL COUNT WITH DIFFERENTIAL
EOS FL: 6 %
LYMPHS FL: 3 %
MONOCYTE-MACROPHAGE-SEROUS FLUID: 34 % — AB (ref 50–90)
NEUTROPHIL FLUID: 57 % — AB (ref 0–25)
WBC FLUID: 1099 uL — AB (ref 0–1000)

## 2017-06-17 LAB — GRAM STAIN

## 2017-06-17 LAB — PROTEIN, PLEURAL OR PERITONEAL FLUID: Total protein, fluid: <3 g/dL

## 2017-06-17 LAB — BASIC METABOLIC PANEL
ANION GAP: 10 (ref 5–15)
BUN: 27 mg/dL — ABNORMAL HIGH (ref 6–20)
CALCIUM: 8.1 mg/dL — AB (ref 8.9–10.3)
CHLORIDE: 102 mmol/L (ref 101–111)
CO2: 29 mmol/L (ref 22–32)
Creatinine, Ser: 1.51 mg/dL — ABNORMAL HIGH (ref 0.61–1.24)
GFR calc non Af Amer: 41 mL/min — ABNORMAL LOW (ref >60)
GFR, EST AFRICAN AMERICAN: 48 mL/min — AB (ref >60)
GLUCOSE: 117 mg/dL — AB (ref 65–99)
Potassium: 4.1 mmol/L (ref 3.5–5.1)
Sodium: 141 mmol/L (ref 135–145)

## 2017-06-17 LAB — CBC
HCT: 42 % (ref 39.0–52.0)
HEMOGLOBIN: 12.6 g/dL — AB (ref 13.0–17.0)
MCH: 26.3 pg (ref 26.0–34.0)
MCHC: 30 g/dL (ref 30.0–36.0)
MCV: 87.7 fL (ref 78.0–100.0)
Platelets: 394 10*3/uL (ref 150–400)
RBC: 4.79 MIL/uL (ref 4.22–5.81)
RDW: 15.4 % (ref 11.5–15.5)
WBC: 12.1 10*3/uL — ABNORMAL HIGH (ref 4.0–10.5)

## 2017-06-17 LAB — MAGNESIUM: Magnesium: 2.4 mg/dL (ref 1.7–2.4)

## 2017-06-17 MED ORDER — LIDOCAINE HCL 2 % IJ SOLN
INTRAMUSCULAR | Status: AC | PRN
  Administered 2017-06-17: 10 mL

## 2017-06-17 MED ORDER — BOOST PLUS PO LIQD
237.0000 mL | Freq: Two times a day (BID) | ORAL | Status: DC
  Administered 2017-06-19 (×2): 237 mL via ORAL
  Filled 2017-06-17 (×8): qty 237

## 2017-06-17 MED ORDER — FUROSEMIDE 10 MG/ML IJ SOLN
40.0000 mg | INTRAMUSCULAR | Status: AC
  Administered 2017-06-17: 40 mg via INTRAVENOUS
  Filled 2017-06-17: qty 4

## 2017-06-17 MED ORDER — FUROSEMIDE 10 MG/ML IJ SOLN
80.0000 mg | Freq: Two times a day (BID) | INTRAMUSCULAR | Status: DC
  Administered 2017-06-17 – 2017-06-18 (×2): 80 mg via INTRAVENOUS
  Filled 2017-06-17 (×2): qty 8

## 2017-06-17 MED ORDER — LIDOCAINE 2% (20 MG/ML) 5 ML SYRINGE
INTRAMUSCULAR | Status: AC
  Filled 2017-06-17: qty 10

## 2017-06-17 MED ORDER — BOOST PO LIQD: 237.0000 mL | Freq: Two times a day (BID) | ORAL | Status: DC

## 2017-06-17 MED ORDER — POTASSIUM CHLORIDE CRYS ER 20 MEQ PO TBCR
20.0000 meq | EXTENDED_RELEASE_TABLET | Freq: Every day | ORAL | Status: DC
  Administered 2017-06-17 – 2017-06-20 (×4): 20 meq via ORAL
  Filled 2017-06-17 (×4): qty 1

## 2017-06-17 NOTE — Consult Note (Signed)
   [facility] Inpatient Consult   06/17/2017  DURWOOD DITTUS Dec 13, 1932 [id number]  Patient was evaluated for [place] services for frequent admissions and active with [facility] RN Care Manager Coordinator.  Came by to see the patient was currently off the unit. Will follow up as time allows. For questions, please contact: Natividad Brood, RN BSN [facility] Liaison  [phone] business mobile phone Toll free office [phone]

## 2017-06-17 NOTE — Procedures (Signed)
PROCEDURE SUMMARY:  Successful US guided diagnostic and therapeutic right thoracentesis. Yielded 2.1 L of amber fluid. Pt tolerated procedure well.  He does have a LARGE amount of fluid remaining.  Procedure stopped once he developed discomfort and pain. No immediate complications.  Specimen was sent for labs. CXR ordered.  Docia Barrier PA-C 06/17/2017 12:02 PM

## 2017-06-17 NOTE — Consult Note (Addendum)
Advanced Heart Failure Team Consult Note   Primary Physician: [name], MD PCP-Cardiologist:  [name], MD  Reason for Consultation: Acute on chronic systolic CHF  HPI:    [name] is seen today for evaluation of acute on chronic systolic CHF at the request of Dr. Radford Pax.   [name] is a 84 y.o. male with history of hypertension, CAD status post CABG and pericardial tissue AVR 8182, chronic systolic HF ejection fraction 25-30% s/p CRT-D, PAF (on Eliquis)  and orthostatic hypotension  Seen in HF clinic 05/08/17. Admitted from there with failure to thrive, melena, and 18 lb weight loss in 2 weeks.  Re-admitted 2/17 - 9/93/71 with A/C systolic CHF. Underwent Thoracentesis 05/27/17 with 2.0 L out. Cath 2/22 as below with stable CAD s/p CABG and elevated wedge. IV diuretics continued. Discharge weight 181 lbs.  Seen in [facility] office 06/14/17. Pt complained of worsening SOB. CT chest revealed increase in large right sided pleural effusion, and BNP found to be severely elevated, so recommended to come into ED.   Pt arrived to ED 06/15/17 in NAD. Complained of fatigue, malaise, dyspnea on exertion, orthopnea, and nausea. He, overall, feels better compared to his previous admission.  He does feel like his UOP has tapered off. Pertinent labs on admission include Cr 1.89, K 4.1, BNP 2293.2, Lactic acid 1.8, Hgb 14.3. CXR with moderate to large R pleural effusion.   Pt feeling better today. Still feels like he is not making as much urine as he "should". Denies orthopnea. Frustrated over recurrence of pleural effusions. He doesn't want to have to "keep doing this".   CT with contrast 06/14/17 - Multiple nodules previously noted no longer seen, stable 3 mm LLL nodule noted, new 3 mm RUL nodule noted.  - Increase in volume of large R pleural effusion.  Echo 05/27/17 LVEF 20-25%, Grade 2 DD, Stable bioprosthetic AV, Mild MR, Severe LAE, PA peak pressure 35 mm Hg  R/LHC  05/31/17  Prox LAD to Mid LAD lesion is 50% stenosed.  Mid LAD to Dist LAD lesion is 25% stenosed.  1st Mrg lesion is 50% stenosed.  Prox RCA to Dist RCA lesion is 100% stenosed.  And is large.  Prox Graft to Mid Graft lesion is 30% stenosed.  And is large.  The flow in the graft is reversed.  There is competitive flow.  And is large.  There is competitive flow.  Ost 1st Diag to 1st Diag lesion is 90% stenosed.   Findings: Ao = 145/67 (99)  RA = 6 RV = 70/9 PA = 69/30 (46) PCW = 26 v = 37 Fick cardiac output/index =4.7/2.3 PVR = 4.3 FA sat = 98% PA sat = 63%, 60%  Assessment: 1. 3v CAD with stable revascularization with patent LIMA-LAD, SVG-diagonal and SVG-RCA 2. 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 Normal cardiac output - no evidence of thyrotoxicosis heart disease or PH  Review of systems complete and found to be negative unless listed in HPI.    Home Medications Prior to Admission medications   Medication Sig Start Date End Date Taking? Authorizing Provider  acetaminophen (TYLENOL) 500 MG tablet Take 1,000 mg by mouth every 6 (six) hours as needed for headache (pain).    Yes [provider]  albuterol (PROVENTIL HFA;VENTOLIN HFA) 108 (90 Base) MCG/ACT inhaler Inhale 1-2 puffs into the lungs every 6 (six) hours as needed for wheezing.  Yes [provider]  atorvastatin (LIPITOR) 20 MG tablet TAKE 1 TABLET (20 MG TOTAL) DAILY AT 6 PM. 09/27/16  Yes Turner, Eber Hong, MD  benzonatate (TESSALON) 200 MG capsule Take 1 capsule (200 mg total) by mouth 2 (two) times daily as needed for cough. 06/04/17  Yes Mikhail, Maryann, DO  carvedilol (COREG) 12.5 MG tablet Take 12.5 mg by mouth 2 (two) times daily. 05/17/17  Yes [provider]  ELIQUIS 2.5 MG TABS tablet TAKE 1 TABLET TWICE DAILY Patient taking differently: TAKE 1 TABLET (2.5 MG) BY MOUTH TWICE DAILY 11/02/16  Yes Turner, Eber Hong, MD   furosemide (LASIX) 40 MG tablet Take 1 tablet (40 mg total) by mouth 2 (two) times daily. 06/04/17  Yes Mikhail, Velta Addison, DO  hydrALAZINE (APRESOLINE) 25 MG tablet Take 12.5 mg by mouth 2 (two) times daily.   Yes [provider]  isosorbide mononitrate (IMDUR) 30 MG 24 hr tablet Take 1 tablet (30 mg total) by mouth at bedtime. Patient taking differently: Take 15 mg by mouth every evening.  06/04/17  Yes Mikhail, [place], DO  lactose free nutrition (BOOST) LIQD Take 237 mLs by mouth 3 (three) times daily as needed (meal supplement).   Yes [provider]  methimazole (TAPAZOLE) 10 MG tablet Take 1 tablet (10 mg total) by mouth 2 (two) times daily. 05/15/17  Yes Philemon Kingdom, MD  mexiletine (MEXITIL) 250 MG capsule Take 1 capsule (250 mg total) by mouth 2 (two) times daily. 04/18/17  Yes Camnitz, Will Hassell Done, MD  mirtazapine (REMERON) 15 MG tablet Take 1 tablet (15 mg total) by mouth at bedtime. 06/04/17  Yes Mikhail, Velta Addison, DO  Multiple Vitamin (MULTIVITAMIN WITH MINERALS) TABS tablet Take 1 tablet by mouth 2 (two) times daily.    Yes [provider]  omega-3 acid ethyl esters (LOVAZA) 1 G capsule Take 2 g by mouth 2 (two) times daily.   Yes [provider]  ondansetron (ZOFRAN-ODT) 4 MG disintegrating tablet Take 4 mg by mouth every 8 (eight) hours as needed for nausea or vomiting.  06/14/17  Yes [provider]  pantoprazole (PROTONIX) 40 MG tablet Take 1 tablet (40 mg total) by mouth daily before breakfast. 05/10/17  Yes Rai, Ripudeep K, MD  predniSONE (DELTASONE) 10 MG tablet Take 2 tablets (20 mg total) by mouth daily with breakfast. 05/28/17  Yes Philemon Kingdom, MD  ranitidine (ZANTAC) 300 MG tablet Take 300 mg by mouth at bedtime.   Yes [provider]  traMADol (ULTRAM) 50 MG tablet Take 1 tablet (50 mg total) by mouth every 12 (twelve) hours as needed for moderate pain or severe pain. 05/10/17  Yes Rai, Ripudeep K, MD  Amino Acids-Protein  Hydrolys (FEEDING SUPPLEMENT, PRO-STAT SUGAR FREE 64,) LIQD Take 30 mLs by mouth daily. Patient not taking: Reported on 06/15/2017 06/05/17   Cristal Ford, DO  feeding supplement, ENSURE ENLIVE, (ENSURE ENLIVE) LIQD Take 237 mLs by mouth 2 (two) times daily between meals. Patient not taking: Reported on 06/15/2017 06/04/17   Cristal Ford, DO    Past Medical History: Past Medical History:  Diagnosis Date  . AICD (automatic cardioverter/defibrillator) present   . Arthritis    "minor everywhere" (04/22/2015)  . Asthma    "a touch"  . Barrett esophagus   . CAD (coronary artery disease)    a. s/p 2 vessel CABG with LIMA to LAD, SVG to diagonal 1, SVG to RCA 12/09.  Marland Kitchen Chronic combined systolic and diastolic CHF (congestive heart failure) ([place])  dry weight 197-200lbs.  . CKD (chronic kidney disease), stage III ([place])   . Diabetes ([place])    "I'm prediabetic" (04/22/2015)  . Dyspnea   . GERD (gastroesophageal reflux disease)   . H/O: rheumatic fever   . Hepatitis 1957   "don't know what kind:  . History of hiatal hernia   . History of PFTs    a. Amiodarone started 5/16 >> PFTs w/ DLCO 6/16:  FEV1 72% predicted, FEV1/FVC 66%, DLCO 66% >> minimal reversible obstructive airways disease with mild diffusion defect (suggestive of emphysema but absence of hyperinflation inconsistent with dx)  . HTN (hypertension)   . Hypercholesteremia   . NICM (nonischemic cardiomyopathy) ([place])    EF 15-20% by echo 2017  . Orthostatic hypotension   . PAF (paroxysmal atrial fibrillation) ([place])   . Pneumonia 08/2014   "dr thought I may have had a touch"  . S/P AVR (aortic valve replacement) 2009   a. severe AS s/p AVR with pericardial tissue valve 2009.  Marland Kitchen Ventricular tachycardia [facility])     Past Surgical History: Past Surgical History:  Procedure Laterality Date  . AORTIC VALVE REPLACEMENT  with 23-mm Magna Ease pericardial valve, model number   with 23-mm Magna Ease pericardial valve, model  [id number], serial number [id number]   . APPENDECTOMY  1940s  . BI-VENTRICULAR IMPLANTABLE CARDIOVERTER DEFIBRILLATOR  (CRT-D)  04/22/2015  . CARDIAC CATHETERIZATION N/A 11/04/2014   Procedure: Left Heart Cath and Cors/Grafts Angiography;  Surgeon: Leonie Man, MD;  Location: [place] [facility];  Service: Cardiovascular;  Laterality: N/A;  . CARDIAC VALVE REPLACEMENT    . CARDIOVERSION N/A 01/27/2013   Procedure: CARDIOVERSION;  Surgeon: Sueanne Margarita, MD;  Location: [place];  Service: Cardiovascular;  Laterality: N/A;  . CARDIOVERSION N/A 08/25/2014   Procedure: CARDIOVERSION;  Surgeon: Sanda Klein, MD;  Location: [facility];  Service: Cardiovascular;  Laterality: N/A;  . CATARACT EXTRACTION W/ INTRAOCULAR LENS  IMPLANT, BILATERAL Bilateral   . CORONARY ARTERY BYPASS GRAFT  03/10/2008   x 3 Dr. Roxan Hockey  . EP IMPLANTABLE DEVICE N/A 04/22/2015   Procedure: BiV ICD Insertion CRT-D;  Surgeon: [name], MD;  Location: [place] [facility];  Service: Cardiovascular;  Laterality: N/A;  . ESOPHAGOGASTRODUODENOSCOPY (EGD) WITH ESOPHAGEAL DILATION  X1  . ESOPHAGOGASTRODUODENOSCOPY (EGD) WITH PROPOFOL N/A 05/09/2017   Procedure: ESOPHAGOGASTRODUODENOSCOPY (EGD) WITH PROPOFOL;  Surgeon: Carol Ada, MD;  Location: [place];  Service: Endoscopy;  Laterality: N/A;  . IR THORACENTESIS ASP PLEURAL SPACE W/IMG GUIDE  05/27/2017  . RIGHT HEART CATH AND CORONARY/GRAFT ANGIOGRAPHY N/A 05/31/2017   Procedure: RIGHT HEART CATH AND CORONARY/GRAFT ANGIOGRAPHY;  Surgeon: Jolaine Artist, MD;  Location: [place] [facility];  Service: Cardiovascular;  Laterality: N/A;  . TONSILLECTOMY      Family History: Family History  Problem Relation Age of Onset  . Alzheimer's disease Mother   . COPD Daughter   . Alzheimer's disease Unknown   . COPD Sister   . Depression Sister   . Heart disease Sister   . Hyperlipidemia Sister   . Hypertension Sister   . Esophageal cancer Sister        +  smoker    Social History: Social History   Socioeconomic History  . Marital status: Married    Spouse name: None  . Number of children: None  . Years of education: None  . Highest education level: None  Social Needs  . Financial resource strain: None  . Food insecurity - worry:  None  . Food insecurity - inability: None  . Transportation needs - medical: None  . Transportation needs - non-medical: None  Occupational History  . None  Tobacco Use  . Smoking status: Never Smoker  . Smokeless tobacco: Never Used  Substance and Sexual Activity  . Alcohol use: Yes    Comment: 04/22/2015 "nothing since the 1980s; never had a problem w/it"  . Drug use: No  . Sexual activity: No  Other Topics Concern  . None  Social History Narrative   Married, lives with spouse [name]   4 children, 2 boys and 2 girls > one daughter passed   OCCUPATION: retired, Pension scheme manager for [employer]:  Allergies  Allergen Reactions  . Novocain [Procaine] Swelling    Objective:    Vital Signs:   Temp:  [97.7 F (36.5 C)-98.3 F (36.8 C)] 98 F (36.7 C) (03/11 0620) Pulse Rate:  [59-77] 77 (03/11 0620) Resp:  [18-20] 20 (03/11 0620) BP: (122-141)/(53-75) 133/75 (03/11 0620) SpO2:  [93 %-95 %] 95 % (03/11 0620) Weight:  [177 lb 11.2 oz (80.6 kg)] 177 lb 11.2 oz (80.6 kg) (03/11 0620) Last BM Date: 06/15/17  Weight change: Filed Weights   06/15/17 2335 06/17/17 0620  Weight: 177 lb 11.2 oz (80.6 kg) 177 lb 11.2 oz (80.6 kg)    Intake/Output:   Intake/Output Summary (Last 24 hours) at 06/17/2017 1046 Last data filed at 06/17/2017 1019 Gross per 24 hour  Intake 820 ml  Output 1325 ml  Net -505 ml      Physical Exam    General:  Elderly appearing. NAD.  HEENT: Normal Neck: supple. JVP 11-12 cm. Carotids 2+ bilat; no bruits. No lymphadenopathy or thyromegaly appreciated. Cor: PMI nondisplaced. Regular rate & rhythm. No rubs, gallops or murmurs. Lungs: Dull R  lung sounds 1/2 way up, Left side with mild basilar crackles.  Abdomen: soft, nontender, nondistended. No hepatosplenomegaly. No bruits or masses. Good bowel sounds. Extremities: no cyanosis, clubbing, rash, or edema. Neuro: alert & orientedx3, cranial nerves grossly intact. moves all 4 extremities w/o difficulty. Affect pleasant   Telemetry   A-sensed V-paced, Personally reviewed  EKG    A-sensed V-paced 68 bpm, 06/16/17.  Labs   Basic Metabolic Panel: Recent Labs  Lab 06/14/17 1234 06/15/17 1931 06/16/17 0309 06/17/17 0512  NA 144 145 141 141  K 4.1 4.4 3.2* 4.1  CL 101 106 104 102  CO2 _0 GLUCOSE 153* 88 159* 117*  BUN 34* 33* 31* 27*  CREATININE 1.89* 1.80* 1.67* 1.51*  CALCIUM 8.6 8.4* 7.8* 8.1*    Liver Function Tests: Recent Labs  Lab 06/15/17 1931  AST 37  ALT 35  ALKPHOS 63  BILITOT 0.6  PROT 5.9*  ALBUMIN 2.8*   No results for input(s): LIPASE, AMYLASE in the last 168 hours. No results for input(s): AMMONIA in the last 168 hours.  CBC: Recent Labs  Lab 06/14/17 1234 06/15/17 1931 06/16/17 0309 06/17/17 0512  WBC 12.3* 13.0* 11.7* 12.1*  HGB 14.3 13.1 11.9* 12.6*  HCT 44.5 41.9 38.1* 42.0  MCV 86 88.2 88.2 87.7  PLT 508* 482* 376 394    Cardiac Enzymes: No results for input(s): CKTOTAL, CKMB, CKMBINDEX, TROPONINI in the last 168 hours.  BNP: BNP (last 3 results) Recent Labs    05/26/17 1226 06/15/17 1931  BNP >4,500.0* 2,293.2*    ProBNP (last 3 results) Recent Labs    04/23/17 1048 05/06/17 1110 06/14/17 1234  PROBNP 10,848* 22,420* 23,607*     CBG: No results for input(s): GLUCAP in the last 168 hours.  Coagulation Studies: No results for input(s): LABPROT, INR in the last 72 hours.   Imaging    No results found.   Medications:     Current Medications: . atorvastatin  20 mg Oral q1800  . carvedilol  12.5 mg Oral BID WC  . famotidine  40 mg Oral QHS  . furosemide  40 mg Intravenous BID  .  heparin  5,000 Units Subcutaneous Q8H  . hydrALAZINE  12.5 mg Oral BID  . isosorbide mononitrate  15 mg Oral QPM  . methimazole  10 mg Oral BID  . mexiletine  250 mg Oral BID  . mirtazapine  15 mg Oral QHS  . multivitamin with minerals  1 tablet Oral BID  . omega-3 acid ethyl esters  2 g Oral BID  . pantoprazole  40 mg Oral QAC breakfast  . predniSONE  20 mg Oral Q breakfast  . sodium chloride flush  3 mL Intravenous Q12H     Infusions: . sodium chloride       Patient Profile   [name] is a 84 y.o. male with history of hypertension, CAD status post CABG and pericardial tissue AVR 8403, chronic systolic HF ejection fraction 25-30% s/p CRT-D, PAF (on Eliquis)  and orthostatic hypotension  Assessment/Plan   1. Acute on chronic systolic CHF due to ICM s/p CRT-D - Echo 05/27/17 LVEF 20-25%, Grade 2 DD, Stable bioprosthetic AV, Mild MR, Severe LAE, PA peak pressure 35 mm Hg - Cath 05/31/17 with patent CABG stents and moderate PH due to mixture of PVH and PAH WHO group 2 & 3. - Volume status elevated on exam - Increase lasix to 80 mg IV BID.  - Continue coreg 12.5 mg BID. Low threshold to cut back.  - Continue hydral 12.5 mg BID - Continue imdur 15 mg daily - No ACE/ARB/ARNi due to CKD  2. Recurrent R pleural effusion - s/p Thoracentesis 05/27/17 with 2.0 L out.  - Chest CT 3/8 with recurrence/worsening - Will likely need repeat Thoracentesis. Will order for today. - Has not received Eliquis this admission (None since 06/15/17)  3. AKI  - Creatinine from 1.3 -> 1.8 on admit.  - Trending down with diuresis.   3. Failure to thrive - Overall has had downhill course over the past several months.   4. CAD s/p CABG/AVR 2009 - No s/s of ischemia.     5. Recent VT - No current noted - Seen by Dr. Curt Bears and switched amio to mexilitene due to SOB  6. Lung nodules - CT with contrast 06/14/17 with multiple nodules previously noted no longer seen, stable 3 mm LLL nodule  noted, new 3 mm RUL nodule noted.  - Pulm to see to weigh in.   7. Chronic AF - Rate controlled.  - Eliquis on hold since admit. Will need to resume.   Medication concerns reviewed with patient and pharmacy team. Barriers identified: None at this time.   Length of Stay: 2  Annamaria Helling  06/17/2017, 10:46 AM  Advanced Heart Failure Team Pager [phone] (M-F; 7a - 4p)  Please contact [facility] for night-coverage after hours (4p -7a ) and weekends on [email or web address]  Patient seen with PA, agree with the above note.   Patient was seen in cardiology office Friday, admitted Saturday due to high pro-BNP.  He was diuresed with IV Lasix over weekend.  He was noted to have large right pleural effusion.   Today, he had right thoracentesis with > 2L off.  By fluid protein, this appears to be a transudate (though there were a lot of WBCs in fluid, no organism on gram stain).  There is a moderate residual pleural effusion.    We increased his Lasix to 80 mg IV bid this afternoon with good UOP.   On exam, JVP around 10 after getting Lasix 80 mg IV.  Creatinine 1.8 at admission, now down to 1.5.   Will continue Lasix 80 mg IV bid, may only need a dose or 2 more.    Will need to watch speed of accumulation of right pleural effusion, he may benefit from a PleurX catheter.   Loralie Champagne 06/17/2017 6:12 PM

## 2017-06-17 NOTE — Progress Notes (Addendum)
Initial Nutrition Assessment  DOCUMENTATION CODES:   Not applicable  INTERVENTION:   -Boost Plus BID, each supplement provides 360 kcals and 16 grams protein -MVI daily  NUTRITION DIAGNOSIS:   Increased nutrient needs related to chronic illness as evidenced by estimated needs.  GOAL:   Patient will meet greater than or equal to 90% of their needs  MONITOR:   PO intake, Supplement acceptance, Labs, Weight trends, Skin, I & O's  REASON FOR ASSESSMENT:   Malnutrition Screening Tool    ASSESSMENT:   [name] is a 84 y.o. male w/ iCM (EF 25%) s/p CABG and ICD, AF, COPD, pHTN, AVR, postural hypotension, hyperthyroidism, and DM presenting with L pleural effusion.  Pt admitted with rt pleural effusion.   Pt out of room at time of visit; pt down for rt thoracentesis. Unable to perform nutrition-focused physical exam or obtain further hx at this time.   Pt is familiar to this RD from prior hospitalization. He historically has a poor appetite. However, noted meal completion 80-100%.   Noted pt has experienced a 6.8% wt loss over the past month, however, suspect some wt loss may be related to fluid losses from CHF. Pt with hx of malnutrition, however, unable to identify at this time. Pt prefers chocolate Boost supplements.  Medications reviewed and include prednisone and remeron.  Labs reviewed.   Diet Order:  Diet Heart Room service appropriate? Yes; Fluid consistency: Thin  EDUCATION NEEDS:   No education needs have been identified at this time  Skin:  Skin Assessment: Reviewed RN Assessment  Last BM:  06/16/17  Height:   Ht Readings from Last 1 Encounters:  06/15/17 6' (1.829 m)    Weight:   Wt Readings from Last 1 Encounters:  06/17/17 177 lb 11.2 oz (80.6 kg)    Ideal Body Weight:  80.9 kg  BMI:  Body mass index is 24.1 kg/m.  Estimated Nutritional Needs:   Kcal:  2000-2200  Protein:  105-120 grams  Fluid:  2.0-2.2 L    Valery Amedee A.  Jimmye Norman, RD, LDN, CDE Pager: [phone] After hours Pager: [phone]

## 2017-06-17 NOTE — Progress Notes (Signed)
Patient is off the floor for thoracentesis. Will give second dose of lasix when patient returns to floor.

## 2017-06-18 ENCOUNTER — Other Ambulatory Visit: Payer: Medicare HMO

## 2017-06-18 ENCOUNTER — Encounter (HOSPITAL_COMMUNITY): Payer: Self-pay | Admitting: Student

## 2017-06-18 ENCOUNTER — Inpatient Hospital Stay (HOSPITAL_COMMUNITY): Payer: Medicare HMO

## 2017-06-18 HISTORY — PX: IR THORACENTESIS ASP PLEURAL SPACE W/IMG GUIDE: IMG5380

## 2017-06-18 LAB — BODY FLUID CELL COUNT WITH DIFFERENTIAL
Eos, Fluid: 3 %
LYMPHS FL: 3 %
MONOCYTE-MACROPHAGE-SEROUS FLUID: 24 % — AB (ref 50–90)
NEUTROPHIL FLUID: 70 % — AB (ref 0–25)
Total Nucleated Cell Count, Fluid: 124 cu mm (ref 0–1000)

## 2017-06-18 LAB — BASIC METABOLIC PANEL
ANION GAP: 11 (ref 5–15)
BUN: 30 mg/dL — ABNORMAL HIGH (ref 6–20)
CALCIUM: 8 mg/dL — AB (ref 8.9–10.3)
CO2: 26 mmol/L (ref 22–32)
CREATININE: 1.54 mg/dL — AB (ref 0.61–1.24)
Chloride: 100 mmol/L — ABNORMAL LOW (ref 101–111)
GFR, EST AFRICAN AMERICAN: 47 mL/min — AB (ref >60)
GFR, EST NON AFRICAN AMERICAN: 40 mL/min — AB (ref >60)
GLUCOSE: 107 mg/dL — AB (ref 65–99)
Potassium: 3.7 mmol/L (ref 3.5–5.1)
Sodium: 137 mmol/L (ref 135–145)

## 2017-06-18 LAB — CBC
HCT: 41.1 % (ref 39.0–52.0)
HEMOGLOBIN: 13.1 g/dL (ref 13.0–17.0)
MCH: 27.6 pg (ref 26.0–34.0)
MCHC: 31.9 g/dL (ref 30.0–36.0)
MCV: 86.5 fL (ref 78.0–100.0)
Platelets: 370 10*3/uL (ref 150–400)
RBC: 4.75 MIL/uL (ref 4.22–5.81)
RDW: 15.4 % (ref 11.5–15.5)
WBC: 13.2 10*3/uL — ABNORMAL HIGH (ref 4.0–10.5)

## 2017-06-18 LAB — GRAM STAIN

## 2017-06-18 LAB — PROTEIN, PLEURAL OR PERITONEAL FLUID: Total protein, fluid: <3 g/dL

## 2017-06-18 LAB — LACTATE DEHYDROGENASE, PLEURAL OR PERITONEAL FLUID: LD FL: 149 U/L — AB (ref 3–23)

## 2017-06-18 LAB — MAGNESIUM: MAGNESIUM: 2 mg/dL (ref 1.7–2.4)

## 2017-06-18 MED ORDER — APIXABAN 2.5 MG PO TABS
2.5000 mg | ORAL_TABLET | Freq: Two times a day (BID) | ORAL | Status: DC
  Administered 2017-06-18 – 2017-06-20 (×4): 2.5 mg via ORAL
  Filled 2017-06-18 (×4): qty 1

## 2017-06-18 MED ORDER — LIDOCAINE 2% (20 MG/ML) 5 ML SYRINGE
INTRAMUSCULAR | Status: AC
  Filled 2017-06-18: qty 10

## 2017-06-18 MED ORDER — LIDOCAINE HCL (PF) 2 % IJ SOLN
INTRAMUSCULAR | Status: AC | PRN
  Administered 2017-06-18: 10 mL

## 2017-06-18 NOTE — Procedures (Signed)
PROCEDURE SUMMARY:  Successful US guided right diagnostic and therapeutic thoracentesis. Yielded 1.1 liters of amber fluid. Pt developed pain and hypotension after 1.1 liters removed. Procedure stopped. No immediate complications.  Specimen was sent for labs. CXR ordered.  Docia Barrier PA-C 06/18/2017 11:26 AM

## 2017-06-18 NOTE — Plan of Care (Signed)
  Education: Ability to demonstrate management of disease process will improve 06/18/2017 0018 - Progressing by Theador Hawthorne, RN   Health Behavior/Discharge Planning: Ability to manage health-related needs will improve 06/18/2017 0018 - Progressing by Theador Hawthorne, RN   Pain Managment: General experience of comfort will improve 06/18/2017 0018 - Progressing by Theador Hawthorne, RN   Safety: Ability to remain free from injury will improve 06/18/2017 0018 - Progressing by Theador Hawthorne, RN   Skin Integrity: Risk for impaired skin integrity will decrease 06/18/2017 0018 - Progressing by Theador Hawthorne, RN

## 2017-06-18 NOTE — Progress Notes (Signed)
   06/18/17 0307  Vitals  BP 106/66  MAP (mmHg) 79  BP Location Right Arm  BP Method Automatic  Patient Position (if appropriate) Lying  Pulse Rate 67  Pulse Rate Source Monitor  Oxygen Therapy  SpO2 93 %  O2 Device Room Air  Patient had a 16 beat run of Vtach. Cardiology on call MD paged and notified. No new orders at this time. Will continue to monitor patient.

## 2017-06-18 NOTE — Progress Notes (Addendum)
Advanced Heart Failure Rounding Note  PCP-Cardiologist: [name], MD   Subjective:    Breathing much improved s/p thoracentesis. He states he felt "OK" when he came in, but now feels even better. Mild lightheadedness with rapid standing at baseline.   S/p Thoracentesis 3/11 with 2.1 L off. Appears transudative.   Plan repeat for today, discussed personally with IR  Down 6 lbs with diuresis and thoracentesis.   Objective:   Weight Range: 171 lb 11.2 oz (77.9 kg) Body mass index is 23.29 kg/m.   Vital Signs:   Temp:  [97.5 F (36.4 C)-97.6 F (36.4 C)] 97.6 F (36.4 C) (03/12 0442) Pulse Rate:  [65-68] 65 (03/12 0442) Resp:  [18] 18 (03/12 0442) BP: (106-125)/(63-68) 113/63 (03/12 0442) SpO2:  [93 %-95 %] 95 % (03/12 0442) Weight:  [171 lb 11.2 oz (77.9 kg)] 171 lb 11.2 oz (77.9 kg) (03/12 0442) Last BM Date: 06/16/17  Weight change: Filed Weights   06/15/17 2335 06/17/17 0620 06/18/17 0442  Weight: 177 lb 11.2 oz (80.6 kg) 177 lb 11.2 oz (80.6 kg) 171 lb 11.2 oz (77.9 kg)    Intake/Output:   Intake/Output Summary (Last 24 hours) at 06/18/2017 1001 Last data filed at 06/18/2017 0920 Gross per 24 hour  Intake 380 ml  Output 2200 ml  Net -1820 ml      Physical Exam    General:  Elderly appearing. NAD. HEENT: Normal Neck: Supple. JVP at least 8-9 cm. Carotids 2+ bilat; no bruits. No lymphadenopathy or thyromegaly appreciated. Cor: PMI nondisplaced. Irregularly irregular. No rubs, gallops or murmurs. Lungs: R lung dull to 1/2 way up. Left side mild basilar crackles. Abdomen: Soft, nontender, nondistended. No hepatosplenomegaly. No bruits or masses. Good bowel sounds. Extremities: No cyanosis, clubbing, rash, or edema Neuro: Alert & orientedx3, cranial nerves grossly intact. moves all 4 extremities w/o difficulty. Affect pleasant  Telemetry   Afib 60s, personally reviewed.   EKG    No new tracings.    Labs    CBC Recent Labs    06/17/17 0512  06/18/17 0348  WBC 12.1* 13.2*  HGB 12.6* 13.1  HCT 42.0 41.1  MCV 87.7 86.5  PLT 394 315   Basic Metabolic Panel Recent Labs    06/17/17 0512 06/18/17 0348  NA 141 137  K 4.1 3.7  CL 102 100*  CO2 29 26  GLUCOSE 117* 107*  BUN 27* 30*  CREATININE 1.51* 1.54*  CALCIUM 8.1* 8.0*  MG 2.4 2.0   Liver Function Tests Recent Labs    06/15/17 1931  AST 37  ALT 35  ALKPHOS 63  BILITOT 0.6  PROT 5.9*  ALBUMIN 2.8*   No results for input(s): LIPASE, AMYLASE in the last 72 hours. Cardiac Enzymes No results for input(s): CKTOTAL, CKMB, CKMBINDEX, TROPONINI in the last 72 hours.  BNP: BNP (last 3 results) Recent Labs    05/26/17 1226 06/15/17 1931  BNP >4,500.0* 2,293.2*    ProBNP (last 3 results) Recent Labs    04/23/17 1048 05/06/17 1110 06/14/17 1234  PROBNP 10,848* 22,420* 23,607*     D-Dimer No results for input(s): DDIMER in the last 72 hours. Hemoglobin A1C No results for input(s): HGBA1C in the last 72 hours. Fasting Lipid Panel No results for input(s): CHOL, HDL, LDLCALC, TRIG, CHOLHDL, LDLDIRECT in the last 72 hours. Thyroid Function Tests No results for input(s): TSH, T4TOTAL, T3FREE, THYROIDAB in the last 72 hours.  Invalid input(s): FREET3  Other results:   Imaging  Dg Chest 1 View  Result Date: 06/17/2017 CLINICAL DATA:  Status post right thoracentesis today. EXAM: CHEST 1 VIEW COMPARISON:  PA and lateral chest 06/15/2017. FINDINGS: Right pleural effusion is decreased after thoracentesis. No pneumothorax. Moderate right effusion and basilar airspace disease persist. The left lung is expanded and clear. There is cardiomegaly. The patient is status post CABG with a pacing device in place. IMPRESSION: Decreased right pleural effusion after thoracentesis. No pneumothorax or other new abnormality. Electronically Signed   By: Inge Rise M.D.   On: 06/17/2017 12:32   Ir Thoracentesis Asp Pleural Space W/img Guide  Result Date:  06/17/2017 INDICATION: Patient with recurrent right pleural effusion. Request is made for diagnostic and therapeutic right thoracentesis. EXAM: ULTRASOUND GUIDED DIAGNOSTIC AND THERAPEUTIC RIGHT THORACENTESIS MEDICATIONS: 10 mL 2% lidocaine COMPLICATIONS: None immediate. PROCEDURE: An ultrasound guided thoracentesis was thoroughly discussed with the patient and questions answered. The benefits, risks, alternatives and complications were also discussed. The patient understands and wishes to proceed with the procedure. Written consent was obtained. Ultrasound was performed to localize and mark an adequate pocket of fluid in the right chest. The area was then prepped and draped in the normal sterile fashion. 1% Lidocaine was used for local anesthesia. Under ultrasound guidance a Safe-T-Centesis catheter was introduced. Thoracentesis was performed. The catheter was removed and a dressing applied. FINDINGS: A total of approximately 2.1 liters of amber fluid was removed. Samples were sent to the laboratory as requested by the clinical team. IMPRESSION: Successful ultrasound guided diagnostic and therapeutic right thoracentesis yielding 2.1 liters of pleural fluid. Read by: Brynda Greathouse PA-C Electronically Signed   By: Aletta Edouard M.D.   On: 06/17/2017 13:37      Medications:     Scheduled Medications: . atorvastatin  20 mg Oral q1800  . carvedilol  12.5 mg Oral BID WC  . famotidine  40 mg Oral QHS  . furosemide  80 mg Intravenous BID  . hydrALAZINE  12.5 mg Oral BID  . isosorbide mononitrate  15 mg Oral QPM  . lactose free nutrition  237 mL Oral BID BM  . methimazole  10 mg Oral BID  . mexiletine  250 mg Oral BID  . mirtazapine  15 mg Oral QHS  . multivitamin with minerals  1 tablet Oral BID  . omega-3 acid ethyl esters  2 g Oral BID  . pantoprazole  40 mg Oral QAC breakfast  . potassium chloride  20 mEq Oral Daily  . predniSONE  20 mg Oral Q breakfast  . sodium chloride flush  3 mL  Intravenous Q12H     Infusions: . sodium chloride       PRN Medications:  sodium chloride, acetaminophen, albuterol, benzonatate, ondansetron, sodium chloride flush, traMADol    Patient Profile   [name] is a 84 y.o. male with history of hypertension,CADstatus post CABG and pericardial tissue AVR 4496,PRFFMBW systolic HFejection fraction 25-30%s/p CRT-D, PAF (on Eliquis) and orthostatic hypotension  Assessment/Plan   1. Acute on chronic systolic CHF due to ICM s/p CRT-D - Echo 05/27/17 LVEF 20-25%, Grade 2 DD, Stable bioprosthetic AV, Mild MR, Severe LAE, PA peak pressure 35 mm Hg - Cath 05/31/17 with patent CABG stents and moderate PH due to mixture of PVH and PAH WHO group 2 & 3. - Volume status remains at least mildly elevated on exam - Got lasix this am. With repeat thoracentesis pending, hold further for now.  - Continue coreg 12.5 mg BID. Low threshold to cut  back.  - Continue hydral 12.5 mg BID - Continue imdur 15 mg daily - No ACE/ARB/ARNi due to CKD  2. Recurrent R pleural effusion - s/p Thoracentesis 05/27/17 with 2.0 L out.  - Chest CT 3/8 with recurrence/worsening - Repeat Thoracentesis 06/17/17 with 2.1 L out. Appears to be transudative with low protein.  Will discuss with IR if they think pt would benefit from repeat, with LARGE residual.   Spoke with IR personally, they perform a second thoracentesis today.  - Has not received Eliquis this admission (None since 06/15/17) - Will need to follow for re-accumulation, and if rapid, will need to consider Pleurex.   3. AKI  - Creatinine mildly elevated but stable at 1.54 with diuresis.  - Continue to follow.   4. CAD s/p CABG/AVR 2009 - No s/s of ischemia.      5. Recent VT - No recurrence noted.  - Seen by Dr. Curt Bears and switched amio to mexilitene due to SOB  6. Lung nodules - CT with contrast 06/14/17 with multiple nodules previously noted no longer seen, stable 3 mm LLL nodule noted, new 3  mm RUL nodule noted.  - Has outpatient pulm follow up.  Will discuss with MD if inpatient consult still warranted with improvement of nodules.   7. Chronic AF - Rate controlled.  - Eliquis on hold since admit. Will discuss timing of further thoracentesis.   Medication concerns reviewed with patient and pharmacy team. Barriers identified: None at this time.   Length of Stay: 3  Annamaria Helling  06/18/2017, 10:01 AM  Advanced Heart Failure Team Pager [phone] (M-F; 7a - 4p)  Please contact [facility] for night-coverage after hours (4p -7a ) and weekends on [email or web address]  Patient seen and examined with the above-signed Advanced Practice Provider and/or Housestaff. I personally reviewed laboratory data, imaging studies and relevant notes. I independently examined the patient and formulated the important aspects of the plan. I have edited the note to reflect any of my changes or salient points. I have personally discussed the plan with the patient and/or family.  Patient seen and examined after repeat thoracentesis. Feels weak but breathing improved. Volume status improved. Continue to treat for hyperthyroidism as I suspect this was a big factor in his weight loss which has now stabilized. Will need to follow closely for reaccumulation of pleural effusion. Suspect he will need Pleurex.   Glori Bickers, MD  5:02 PM

## 2017-06-18 NOTE — Discharge Instructions (Signed)

## 2017-06-18 NOTE — Consult Note (Signed)
[facility] Inpatient Consult   06/18/2017  [name] 1932-10-07 [id number]  Patient is currently active with [facility] for chronic disease management services in the [facility].  Patient has been engaged by a [employer].   84 year old with Right Pleural Effusion, includes but not limited to Hx of HF. Patient recently returning from Interventional Radiology states, "They had to take off some more fluid.  It was too bad yesterday but, it kind of hurt today."  Patient lying in bed resting, flat affect, and states endorsement for ongoing [facility] services.   Our community based plan of care has focused on disease management and community resource support.  Patient will receive a post discharge transition of care call and will be evaluated for monthly home visits for assessments and disease process education.  Made Inpatient Case Manager aware that [facility] following.   Of note, [facility] Management services does not replace or interfere with any services that are needed or arranged by inpatient case management or social work.  For additional questions or referrals please contact:  Natividad Brood, RN BSN [facility] Liaison  [phone] business mobile phone Toll free office [phone]

## 2017-06-19 ENCOUNTER — Ambulatory Visit: Payer: Self-pay | Admitting: *Deleted

## 2017-06-19 DIAGNOSIS — I472 Ventricular tachycardia: Secondary | ICD-10-CM

## 2017-06-19 LAB — BASIC METABOLIC PANEL
ANION GAP: 10 (ref 5–15)
BUN: 32 mg/dL — ABNORMAL HIGH (ref 6–20)
CALCIUM: 8.2 mg/dL — AB (ref 8.9–10.3)
CO2: 27 mmol/L (ref 22–32)
Chloride: 100 mmol/L — ABNORMAL LOW (ref 101–111)
Creatinine, Ser: 1.37 mg/dL — ABNORMAL HIGH (ref 0.61–1.24)
GFR, EST AFRICAN AMERICAN: 54 mL/min — AB (ref >60)
GFR, EST NON AFRICAN AMERICAN: 46 mL/min — AB (ref >60)
Glucose, Bld: 105 mg/dL — ABNORMAL HIGH (ref 65–99)
POTASSIUM: 3.7 mmol/L (ref 3.5–5.1)
SODIUM: 137 mmol/L (ref 135–145)

## 2017-06-19 LAB — CBC
HEMATOCRIT: 42.4 % (ref 39.0–52.0)
HEMOGLOBIN: 13.6 g/dL (ref 13.0–17.0)
MCH: 27.6 pg (ref 26.0–34.0)
MCHC: 32.1 g/dL (ref 30.0–36.0)
MCV: 86.2 fL (ref 78.0–100.0)
Platelets: 335 10*3/uL (ref 150–400)
RBC: 4.92 MIL/uL (ref 4.22–5.81)
RDW: 15.6 % — ABNORMAL HIGH (ref 11.5–15.5)
WBC: 12 10*3/uL — AB (ref 4.0–10.5)

## 2017-06-19 LAB — LACTATE DEHYDROGENASE: LDH: 176 U/L (ref 98–192)

## 2017-06-19 LAB — PATHOLOGIST SMEAR REVIEW

## 2017-06-19 LAB — MAGNESIUM: Magnesium: 2.3 mg/dL (ref 1.7–2.4)

## 2017-06-19 MED ORDER — FUROSEMIDE 40 MG PO TABS
40.0000 mg | ORAL_TABLET | Freq: Two times a day (BID) | ORAL | Status: DC
  Administered 2017-06-19 – 2017-06-20 (×3): 40 mg via ORAL
  Filled 2017-06-19 (×3): qty 1

## 2017-06-19 MED ORDER — MEXILETINE HCL 250 MG PO CAPS
250.0000 mg | ORAL_CAPSULE | Freq: Two times a day (BID) | ORAL | Status: DC
  Administered 2017-06-19 – 2017-06-20 (×2): 250 mg via ORAL
  Filled 2017-06-19 (×3): qty 1

## 2017-06-19 MED ORDER — POTASSIUM CHLORIDE CRYS ER 20 MEQ PO TBCR
40.0000 meq | EXTENDED_RELEASE_TABLET | Freq: Once | ORAL | Status: AC
  Administered 2017-06-19: 40 meq via ORAL
  Filled 2017-06-19: qty 2

## 2017-06-19 MED ORDER — BISACODYL 10 MG RE SUPP
10.0000 mg | Freq: Once | RECTAL | Status: AC
  Administered 2017-06-19: 10 mg via RECTAL
  Filled 2017-06-19: qty 1

## 2017-06-19 MED ORDER — POLYETHYLENE GLYCOL 3350 17 G PO PACK
17.0000 g | PACK | Freq: Every day | ORAL | Status: DC
  Administered 2017-06-19 – 2017-06-20 (×2): 17 g via ORAL
  Filled 2017-06-19 (×2): qty 1

## 2017-06-19 MED ORDER — MEXILETINE HCL 200 MG PO CAPS: 200.0000 mg | ORAL_CAPSULE | Freq: Three times a day (TID) | ORAL | Status: DC

## 2017-06-19 NOTE — Progress Notes (Signed)
Pt walked the halls with no distress up to bathroom no BM yet stated that he has hard time using bathroom in hospital, 3 day without b,and wanted to wait to take a stool softener or laxative.

## 2017-06-19 NOTE — Progress Notes (Signed)
Paged General cardiologist and heart failure team to make aware of VT QRS widening. Patient is non symptomatic.

## 2017-06-19 NOTE — Progress Notes (Signed)
Patient had a 20mn sustained wide  Qrs. Patient asymptomatic. Hr 110. Bp 116/68. No complaints of CP. Dr. HKenton KingfisherCardiology on call paged and notified. No new orders at this time. Order to monitor patient overnight. Patient stable now. V paced Hr 66. Will continue to monitor.

## 2017-06-19 NOTE — Progress Notes (Signed)
Pt is constipated gave supp hard stool felt upon digital insertion of supp. Have bee having frequent VT medications  Adjusted, resumed lasix po, plan NPO MN  for nuclear scanning for tumors.

## 2017-06-19 NOTE — Plan of Care (Signed)
  Cardiac: Ability to achieve and maintain adequate cardiopulmonary perfusion will improve 06/19/2017 0126 - Progressing by Theador Hawthorne, RN   Nutrition: Adequate nutrition will be maintained 06/19/2017 0126 - Progressing by Theador Hawthorne, RN   Safety: Ability to remain free from injury will improve 06/19/2017 0126 - Progressing by Theador Hawthorne, RN   Skin Integrity: Risk for impaired skin integrity will decrease 06/19/2017 0126 - Progressing by Theador Hawthorne, RN

## 2017-06-19 NOTE — Plan of Care (Signed)
  Health Behavior/Discharge Planning: Ability to manage health-related needs will improve 06/19/2017 2316 - Progressing by Tristan Schroeder, RN   Clinical Measurements: Ability to maintain clinical measurements within normal limits will improve 06/19/2017 2316 - Progressing by Tristan Schroeder, RN   Clinical Measurements: Cardiovascular complication will be avoided 06/19/2017 2316 - Progressing by Tristan Schroeder, RN

## 2017-06-19 NOTE — Progress Notes (Addendum)
Advanced Heart Failure Rounding Note  PCP-Cardiologist: [name], MD   Subjective:    S/p Thoracentesis 3/11 with 2.1 L off. Appears transudative.    S/p repeat thoracentesis 3/12 with 1.1 L. Stopped due to hypotension. Fluid LDH elevated. Serum LDH pending.  Feeling better today. Feels like he can take bigger breaths after thoracenteses. Denies lightheadedness or dizziness. He is not feeling his episodes of NSVT.   Down another 2 lbs. Creatinine continues to improve.   Objective:   Weight Range: 169 lb (76.7 kg) Body mass index is 22.92 kg/m.   Vital Signs:   Temp:  [97.3 F (36.3 C)] 97.3 F (36.3 C) (03/13 0617) Pulse Rate:  [62-66] 66 (03/13 0810) Resp:  [16-18] 18 (03/13 0617) BP: (100-137)/(58-88) 124/62 (03/13 0810) SpO2:  [96 %-98 %] 98 % (03/13 0810) Weight:  [169 lb (76.7 kg)] 169 lb (76.7 kg) (03/13 0617) Last BM Date: 06/19/17  Weight change: Filed Weights   06/17/17 0620 06/18/17 0442 06/19/17 0617  Weight: 177 lb 11.2 oz (80.6 kg) 171 lb 11.2 oz (77.9 kg) 169 lb (76.7 kg)    Intake/Output:   Intake/Output Summary (Last 24 hours) at 06/19/2017 1001 Last data filed at 06/19/2017 0618 Gross per 24 hour  Intake 120 ml  Output 500 ml  Net -380 ml      Physical Exam    General: Elderly appearing. NAD.  HEENT: Normal Neck: Supple. JVP 6-7 cm Carotids 2+ bilat; no bruits. No thyromegaly or nodule noted. Cor: PMI nondisplaced. Irregularly irregular with ectopy. No M/G/R noted Lungs: R lung dull to 1/2 way up. Left side mild basilar crackles.  Abdomen: Soft, non-tender, non-distended, no HSM. No bruits or masses. +BS  Extremities: No cyanosis, clubbing, or rash. R and LLE no edema.  Neuro: Alert & orientedx3, cranial nerves grossly intact. moves all 4 extremities w/o difficulty. Affect pleasant   Telemetry   Afib 60s, Multiple runs of NSVT this am, up to 40+ beats, personally reviewed.   EKG    No new tracings.   Labs    CBC Recent  Labs    06/18/17 0348 06/19/17 0540  WBC 13.2* 12.0*  HGB 13.1 13.6  HCT 41.1 42.4  MCV 86.5 86.2  PLT 370 539   Basic Metabolic Panel Recent Labs    06/18/17 0348 06/19/17 0540  NA 137 137  K 3.7 3.7  CL 100* 100*  CO2 26 27  GLUCOSE 107* 105*  BUN 30* 32*  CREATININE 1.54* 1.37*  CALCIUM 8.0* 8.2*  MG 2.0 2.3   Liver Function Tests No results for input(s): AST, ALT, ALKPHOS, BILITOT, PROT, ALBUMIN in the last 72 hours. No results for input(s): LIPASE, AMYLASE in the last 72 hours. Cardiac Enzymes No results for input(s): CKTOTAL, CKMB, CKMBINDEX, TROPONINI in the last 72 hours.  BNP: BNP (last 3 results) Recent Labs    05/26/17 1226 06/15/17 1931  BNP >4,500.0* 2,293.2*    ProBNP (last 3 results) Recent Labs    04/23/17 1048 05/06/17 1110 06/14/17 1234  PROBNP 10,848* 22,420* 23,607*     D-Dimer No results for input(s): DDIMER in the last 72 hours. Hemoglobin A1C No results for input(s): HGBA1C in the last 72 hours. Fasting Lipid Panel No results for input(s): CHOL, HDL, LDLCALC, TRIG, CHOLHDL, LDLDIRECT in the last 72 hours. Thyroid Function Tests No results for input(s): TSH, T4TOTAL, T3FREE, THYROIDAB in the last 72 hours.  Invalid input(s): FREET3  Other results:   Imaging  Dg Chest 1 View  Result Date: 06/18/2017 CLINICAL DATA:  Status post right thoracentesis. EXAM: CHEST  1 VIEW COMPARISON:  Radiograph of June 17, 2017. FINDINGS: Stable cardiomegaly. Status post coronary bypass graft. Atherosclerosis of thoracic aorta is noted. Left-sided pacemaker is unchanged in position. No pneumothorax is noted. Left lung is clear. Mild right pleural effusion is noted which is significantly smaller compared to prior exam. Mild right basilar subsegmental atelectasis is noted. Bony thorax is unremarkable. IMPRESSION: Decreased right pleural effusion is noted status post thoracentesis. No pneumothorax is noted. Electronically Signed   By: Marijo Conception, M.D.   On: 06/18/2017 11:40   Ir Thoracentesis Asp Pleural Space W/img Guide  Result Date: 06/18/2017 INDICATION: Patient with CHF and recurrent pleural effusion. Request is made for diagnostic and therapeutic thoracentesis. EXAM: ULTRASOUND GUIDED DIAGNOSTIC AND THERAPEUTIC RIGHT THORACENTESIS MEDICATIONS: 10 mL 2% lidocaine COMPLICATIONS: None immediate. PROCEDURE: An ultrasound guided thoracentesis was thoroughly discussed with the patient and questions answered. The benefits, risks, alternatives and complications were also discussed. The patient understands and wishes to proceed with the procedure. Written consent was obtained. Ultrasound was performed to localize and mark an adequate pocket of fluid in the right chest. The area was then prepped and draped in the normal sterile fashion. 1% Lidocaine was used for local anesthesia. Under ultrasound guidance a Safe-T-Centesis catheter was introduced. Thoracentesis was performed. The catheter was removed and a dressing applied. FINDINGS: A total of approximately 1.1 liters of amber fluid was removed. Samples were sent to the laboratory as requested by the clinical team. IMPRESSION: Successful ultrasound guided right thoracentesis yielding 1.1 liters of pleural fluid. Read by: Brynda Greathouse PA-C Electronically Signed   By: Corrie Mckusick D.O.   On: 06/18/2017 12:18     Medications:     Scheduled Medications: . apixaban  2.5 mg Oral BID  . atorvastatin  20 mg Oral q1800  . carvedilol  12.5 mg Oral BID WC  . famotidine  40 mg Oral QHS  . hydrALAZINE  12.5 mg Oral BID  . isosorbide mononitrate  15 mg Oral QPM  . lactose free nutrition  237 mL Oral BID BM  . methimazole  10 mg Oral BID  . mexiletine  250 mg Oral BID  . mirtazapine  15 mg Oral QHS  . multivitamin with minerals  1 tablet Oral BID  . omega-3 acid ethyl esters  2 g Oral BID  . pantoprazole  40 mg Oral QAC breakfast  . potassium chloride  20 mEq Oral Daily  . predniSONE  20 mg  Oral Q breakfast  . sodium chloride flush  3 mL Intravenous Q12H    Infusions: . sodium chloride      PRN Medications: sodium chloride, acetaminophen, albuterol, benzonatate, ondansetron, sodium chloride flush, traMADol    Patient Profile   [name] is a 84 y.o. male with history of hypertension,CADstatus post CABG and pericardial tissue AVR 4235,TIRWERX systolic HFejection fraction 25-30%s/p CRT-D, PAF (on Eliquis) and orthostatic hypotension  Assessment/Plan   1. Acute on chronic systolic CHF due to ICM s/p CRT-D - Echo 05/27/17 LVEF 20-25%, Grade 2 DD, Stable bioprosthetic AV, Mild MR, Severe LAE, PA peak pressure 35 mm Hg - Cath 05/31/17 with patent CABG stents and moderate PH due to mixture of PVH and PAH WHO group 2 & 3. - Volume status much improved.  - Back to po lasix at 40 mg BID.  - Continue coreg 12.5 mg BID. Low threshold to cut  back.  - Continue hydral 12.5 mg BID - Continue imdur 15 mg daily - No ACE/ARB/ARNi due to CKD - Will check UPEP, SPEP, and order PYP-scan with concerns for amyloidosis with cardiac involvement.  2. Recurrent R pleural effusion - s/p Thoracentesis 05/27/17 with 2.0 L out.  - Chest CT 3/8 with recurrence/worsening - Repeat Thoracentesis 06/17/17 with 2.1 L out. Appears to be transudative with low protein.  Will discuss with IR if they think pt would benefit from repeat, with LARGE residual.    - Repeat Thoracentesis 06/18/17 with 1.1 L out. IR estimates at least 1 L left, but recommended rest in between 3rd attempt if deemed necessary - Will need to consider Pleurex if re-accumulates a third time.   3. AKI  - Creatinine continues to trend down.  - Continue to follow.   4. CAD s/p CABG/AVR 2009 - No s/s of ischemia.     5. Recent VT - More frequent started this am. K 3.7 but Mg stable.   - On mexitil 250 mg BID. Will need to consider increase, vs EP consult.  - Recently switched from amio to mexitil with SOB and  hyperthyroid.   6. Lung nodules - CT with contrast 06/14/17 with multiple nodules previously noted no longer seen, stable 3 mm LLL nodule noted, new 3 mm RUL nodule noted.  - Has outpatient pulm follow up.  Will discuss with MD if inpatient consult still warranted with improvement of nodules. No change.   7. Chronic AF - Rate controlled.  - Eliquis on hold since admit. Will discuss timing of further thoracentesis.   Medication concerns reviewed with patient and pharmacy team. Barriers identified: None at this time.   Length of Stay: [place], [place]  06/19/2017, 10:01 AM  Advanced Heart Failure Team Pager [phone] (M-F; 7a - 4p)  Please contact [facility] for night-coverage after hours (4p -7a ) and weekends on [email or web address]     Patient seen and examined with the above-signed Advanced Practice Provider and/or Housestaff. I personally reviewed laboratory data, imaging studies and relevant notes. I independently examined the patient and formulated the important aspects of the plan. I have edited the note to reflect any of my changes or salient points. I have personally discussed the plan with the patient and/or family.  CXR reviewed personally. R effusion much improved after thoracentesis x 2. Breathing better. Can change lasix back to po.  Now having frequent bursts of NSVT. Despite mexilitene. Will supp electrolytes. I am concerned that he may have amyloid. Check SPEP/UPEP. We have arranged for PYP scan in am. I have discussed with Nuclear Cardiology personally. Can resume Eliquis. Discussed with PharmD personally.  Glori Bickers, MD  9:25 PM

## 2017-06-19 NOTE — Evaluation (Signed)
Physical Therapy Evaluation Patient Details Name: [name] MRN: [id number] DOB: 06-28-1932 Today's Date: 06/19/2017   History of Present Illness  Pt is a 84 y.o. male admitted 06/15/17 with L pleural effusion. S/p thoracentesis 3/11 and 3/12. PMH includes CABG/AVR, ICD, a-fib, COPD, HTN, DM. Of note, recently admitted 2/17-2/26 with CHF and pleural effusion.    Clinical Impression  Patient evaluated by Physical Therapy with no further acute PT needs identified. PTA, pt has been working with [facility] since recent admission. Today, pt able to amb mod indep with rollator. All education has been completed and the patient has no further questions. Recommend pt continue working with [facility]. PT is signing off. Thank you for this referral.    Follow Up Recommendations Home health PT;Supervision - Intermittent    Equipment Recommendations  Other (comment)(rollator)    Recommendations for Other Services       Precautions / Restrictions Precautions Precautions: Fall Restrictions Weight Bearing Restrictions: No      Mobility  Bed Mobility Overal bed mobility: Modified Independent             General bed mobility comments: Mod indep with use of bed rail  Transfers Overall transfer level: Modified independent Equipment used: 4-wheeled walker Transfers: Sit to/from Stand           General transfer comment: Cues to brake rollator when standing. Upon return to room, pt able to demonstrate brake locking of rollator before sitting  Ambulation/Gait Ambulation/Gait assistance: Modified independent (Device/Increase time) Ambulation Distance (Feet): 900 Feet Assistive device: 4-wheeled walker Gait Pattern/deviations: Step-through pattern;Trunk flexed;Decreased stride length Gait velocity: Decreased Gait velocity interpretation: <1.8 ft/sec, indicative of risk for recurrent falls General Gait Details: Slow, controlled amb mod indep with rollator  Stairs             Wheelchair Mobility    Modified Rankin (Stroke Patients Only)       Balance Overall balance assessment: Needs assistance Sitting-balance support: No upper extremity supported;Feet supported Sitting balance-Leahy Scale: Good     Standing balance support: No upper extremity supported;Bilateral upper extremity supported;During functional activity Standing balance-Leahy Scale: Fair Standing balance comment: pt is able to static stand without UE support                             Pertinent Vitals/Pain Pain Assessment: Faces Faces Pain Scale: Hurts a little bit Pain Location: Abdominal tightness s/p thoracentesis Pain Descriptors / Indicators: Sore;Tightness Pain Intervention(s): Monitored during session    Home Living Family/patient expects to be discharged to:: Private residence Living Arrangements: Spouse/significant other Available Help at Discharge: Family;Available 24 hours/day Type of Home: House Home Access: Ramped entrance     Home Layout: One level Home Equipment: Cane - quad;Bedside commode;Grab bars - toilet;Grab bars - tub/shower;Walker - 2 wheels Additional Comments: Wife is available for 24/7 supervision but per chart review she has her own health issues. Unsure if she will be able to provide assistance.     Prior Function Level of Independence: Needs assistance   Gait / Transfers Assistance Needed: Intermittent use of RW at home. Rollator is broken  ADL's / Homemaking Assistance Needed: Pt reports indep with ADLs        Hand Dominance        Extremity/Trunk Assessment   Upper Extremity Assessment Upper Extremity Assessment: Overall WFL for tasks assessed    Lower Extremity Assessment Lower Extremity Assessment: Generalized weakness  Communication   Communication: No difficulties  Cognition Arousal/Alertness: Awake/alert Behavior During Therapy: WFL for tasks assessed/performed Overall Cognitive Status: Within Functional  Limits for tasks assessed                                        General Comments      Exercises     Assessment/Plan    PT Assessment All further PT needs can be met in the next venue of care  PT Problem List Decreased strength;Decreased activity tolerance;Decreased mobility;Decreased balance       PT Treatment Interventions      PT Goals (Current goals can be found in the Care Plan section)  Acute Rehab PT Goals PT Goal Formulation: All assessment and education complete, DC therapy    Frequency     Barriers to discharge        Co-evaluation               AM-PAC PT "6 Clicks" Daily Activity  Outcome Measure Difficulty turning over in bed (including adjusting bedclothes, sheets and blankets)?: None Difficulty moving from lying on back to sitting on the side of the bed? : None Difficulty sitting down on and standing up from a chair with arms (e.g., wheelchair, bedside commode, etc,.)?: None Help needed moving to and from a bed to chair (including a wheelchair)?: A Little Help needed walking in hospital room?: A Little Help needed climbing 3-5 steps with a railing? : A Little 6 Click Score: 21    End of Session Equipment Utilized During Treatment: Gait belt Activity Tolerance: Patient tolerated treatment well Patient left: in bed;with call bell/phone within reach;with nursing/sitter in room Nurse Communication: Mobility status PT Visit Diagnosis: Other abnormalities of gait and mobility (R26.89)    Time: 1101-1115 PT Time Calculation (min) (ACUTE ONLY): 14 min   Charges:   PT Evaluation $PT Eval Moderate Complexity: 1 Mod     PT G Codes:       Mabeline Caras, PT, DPT Acute Rehab Services  Pager: [phone] 06/19/2017, 11:43 AM

## 2017-06-19 NOTE — Care Management Important Message (Signed)
Important Message  Patient Details  Name: [name] MRN: [id number] Date of Birth: [birth year]   Medicare Important Message Given:  Yes    Artie Mcintyre P [place] 06/19/2017, 2:13 PM

## 2017-06-20 ENCOUNTER — Encounter (HOSPITAL_COMMUNITY): Payer: Medicare HMO

## 2017-06-20 ENCOUNTER — Inpatient Hospital Stay (HOSPITAL_COMMUNITY): Payer: Medicare HMO

## 2017-06-20 LAB — PROTEIN ELECTROPHORESIS, SERUM
A/G RATIO SPE: 1 (ref 0.7–1.7)
Albumin ELP: 2.5 g/dL — ABNORMAL LOW (ref 2.9–4.4)
Alpha-1-Globulin: 0.3 g/dL (ref 0.0–0.4)
Alpha-2-Globulin: 0.9 g/dL (ref 0.4–1.0)
BETA GLOBULIN: 0.8 g/dL (ref 0.7–1.3)
Gamma Globulin: 0.6 g/dL (ref 0.4–1.8)
Globulin, Total: 2.6 g/dL (ref 2.2–3.9)
TOTAL PROTEIN ELP: 5.1 g/dL — AB (ref 6.0–8.5)

## 2017-06-20 LAB — BASIC METABOLIC PANEL
Anion gap: 10 (ref 5–15)
BUN: 33 mg/dL — AB (ref 6–20)
CO2: 26 mmol/L (ref 22–32)
Calcium: 8.2 mg/dL — ABNORMAL LOW (ref 8.9–10.3)
Chloride: 100 mmol/L — ABNORMAL LOW (ref 101–111)
Creatinine, Ser: 1.4 mg/dL — ABNORMAL HIGH (ref 0.61–1.24)
GFR calc Af Amer: 52 mL/min — ABNORMAL LOW (ref >60)
GFR, EST NON AFRICAN AMERICAN: 45 mL/min — AB (ref >60)
GLUCOSE: 108 mg/dL — AB (ref 65–99)
Potassium: 4.4 mmol/L (ref 3.5–5.1)
Sodium: 136 mmol/L (ref 135–145)

## 2017-06-20 LAB — MAGNESIUM: Magnesium: 2.2 mg/dL (ref 1.7–2.4)

## 2017-06-20 LAB — CBC
HCT: 42.2 % (ref 39.0–52.0)
Hemoglobin: 13.9 g/dL (ref 13.0–17.0)
MCH: 28.3 pg (ref 26.0–34.0)
MCHC: 32.9 g/dL (ref 30.0–36.0)
MCV: 85.8 fL (ref 78.0–100.0)
PLATELETS: 342 10*3/uL (ref 150–400)
RBC: 4.92 MIL/uL (ref 4.22–5.81)
RDW: 15.9 % — AB (ref 11.5–15.5)
WBC: 14.9 10*3/uL — ABNORMAL HIGH (ref 4.0–10.5)

## 2017-06-20 MED ORDER — POTASSIUM CHLORIDE CRYS ER 20 MEQ PO TBCR: 20.0000 meq | EXTENDED_RELEASE_TABLET | Freq: Every day | ORAL | 3 refills | Status: DC

## 2017-06-20 MED ORDER — ISOSORBIDE MONONITRATE ER 30 MG PO TB24: 15.0000 mg | ORAL_TABLET | Freq: Every evening | ORAL | Status: DC

## 2017-06-20 MED ORDER — TECHNETIUM TC 99M PYROPHOSPHATE
20.0000 | Freq: Once | INTRAVENOUS | Status: AC
  Administered 2017-06-20: 20 via INTRAVENOUS
  Filled 2017-06-20: qty 20

## 2017-06-20 NOTE — Progress Notes (Signed)
Nutrition Follow-up  DOCUMENTATION CODES:   Severe malnutrition in context of chronic illness  INTERVENTION:   -Continue Boost Plus BID, each supplement provides 260 kcals and 16 grams protein  NUTRITION DIAGNOSIS:   Severe Malnutrition related to chronic illness(COPD, CHF) as evidenced by moderate muscle depletion, severe muscle depletion, moderate fat depletion, severe fat depletion.  Ongoing  GOAL:   Patient will meet greater than or equal to 90% of their needs  Progressing  MONITOR:   PO intake, Labs, Supplement acceptance, Weight trends, Skin, I & O's  REASON FOR ASSESSMENT:   Malnutrition Screening Tool    ASSESSMENT:   [name] is a 84 y.o. male w/ iCM (EF 25%) s/p CABG and ICD, AF, COPD, pHTN, AVR, postural hypotension, hyperthyroidism, and DM presenting with L pleural effusion.  3/11- s/p rt thoracentesis, yielded 2.1 L 3/12- s/p rt thoracentesis, yielded 1.1 L  Spoke with pt at bedside, who reports appetite is greatly improved since last admission. Pt reports he generally consumes 3 meals per day (Breakfast: eggs, toast and [place] sausage OR grits, eggs, and [place] sausage; Lunch: meat and vegetables, Dinner: pizza). Pt reports he is very curious about what he should eat because "everything has salt in it".   Reviewed wt hx; noted UBW around 200#. Noted pt has experienced a 17% wt loss over the past 3 months, however, suspect some wt loss lis likely related to fluid losses from diuresis and thoracentesis.   Pt consumed 100% of lunch meal today. Intake is variable; PO: 25-80%. Per discussion with RN, pt is taking Boost supplements.   Discussed with pt low to limit sodium, but also increase calories and protein in diet to prevent further weight loss.   Labs reviewed.   NUTRITION - FOCUSED PHYSICAL EXAM:    Most Recent Value  Orbital Region  Moderate depletion  Upper Arm Region  Severe depletion  Thoracic and Lumbar Region  Severe depletion  Buccal  Region  Mild depletion  Temple Region  Moderate depletion  Clavicle Bone Region  Severe depletion  Clavicle and Acromion Bone Region  Moderate depletion  Scapular Bone Region  Moderate depletion  Dorsal Hand  Severe depletion  Patellar Region  Moderate depletion  Anterior Thigh Region  Moderate depletion  Posterior Calf Region  Moderate depletion  Edema (RD Assessment)  Mild  Hair  Reviewed  Eyes  Reviewed  Mouth  Reviewed  Skin  Reviewed  Nails  Reviewed       Diet Order:  Diet Heart Room service appropriate? Yes; Fluid consistency: Thin; Fluid restriction: 1500 mL Fluid  EDUCATION NEEDS:   Education needs have been addressed  Skin:  Skin Assessment: Reviewed RN Assessment  Last BM:  06/19/17  Height:   Ht Readings from Last 1 Encounters:  06/15/17 6' (1.829 m)    Weight:   Wt Readings from Last 1 Encounters:  06/20/17 170 lb 3.2 oz (77.2 kg)    Ideal Body Weight:  80.9 kg  BMI:  Body mass index is 23.08 kg/m.  Estimated Nutritional Needs:   Kcal:  2000-2200  Protein:  105-120 grams  Fluid:  2.0-2.2 L    Meleane Selinger A. Jimmye Norman, RD, LDN, CDE Pager: [phone] After hours Pager: [phone]

## 2017-06-20 NOTE — Progress Notes (Signed)
Pt is alert and oriented vital stable, vital NPO for NM procedure. NM staff has given pre injection medication. Plan to scan in about 1 hour. Patient understands that he must remain NPO until after the procedure.

## 2017-06-20 NOTE — Progress Notes (Signed)
Pt has been given discharge information, IV out and CCMD d/c. checklist complete and home health has been set up.

## 2017-06-20 NOTE — Progress Notes (Signed)
CM talked to patient with his son at the bedside, patient is active with [facility] as prior to admission; [facility] with [facility] called and is aware of discharge home today.Mindi Slicker [facility] [phone]

## 2017-06-20 NOTE — Progress Notes (Signed)
Paged md for diet order to eat. Pt sitting in bed no distress.

## 2017-06-20 NOTE — Progress Notes (Addendum)
Advanced Heart Failure Rounding Note  PCP-Cardiologist: [name], MD   Subjective:    S/p Thoracentesis 3/11 with 2.1 L off. Appears transudative.    S/p repeat thoracentesis 3/12 with 1.1 L. Stopped due to hypotension. Fluid LDH elevated. Serum LDH pending.  Breathing is better today. Feels like UOP has picked up. Denies lightheadedness or dizziness.   Weight up 1 lb. Creatinine stable on po diuretics.   PYP scan this am.   Objective:   Weight Range: 170 lb 3.2 oz (77.2 kg) Body mass index is 23.08 kg/m.   Vital Signs:   Temp:  [97.5 F (36.4 C)-97.7 F (36.5 C)] 97.6 F (36.4 C) (03/14 0757) Pulse Rate:  [59-66] 63 (03/14 0757) Resp:  [16-18] 18 (03/14 0757) BP: (100-146)/(57-74) 146/68 (03/14 0757) SpO2:  [96 %-98 %] 96 % (03/14 0757) Weight:  [170 lb 3.2 oz (77.2 kg)] 170 lb 3.2 oz (77.2 kg) (03/14 0453) Last BM Date: 06/19/17  Weight change: Filed Weights   06/18/17 0442 06/19/17 0617 06/20/17 0453  Weight: 171 lb 11.2 oz (77.9 kg) 169 lb (76.7 kg) 170 lb 3.2 oz (77.2 kg)    Intake/Output:   Intake/Output Summary (Last 24 hours) at 06/20/2017 1037 Last data filed at 06/20/2017 0819 Gross per 24 hour  Intake 600 ml  Output 1626 ml  Net -1026 ml      Physical Exam    General: Elderly appearing. NAD.  HEENT: Normal Neck: Supple. JVP 6-7 cm. Carotids 2+ bilat; no bruits. No thyromegaly or nodule noted. Cor: PMI nondisplaced. Irregularly irregular with ectopy. No M/G/R noted Lungs: R lung dull 1/3 way up. Left side mild basilar crackles.  Abdomen: Soft, non-tender, non-distended, no HSM. No bruits or masses. +BS  Extremities: No cyanosis, clubbing, or rash. R and LLE no edema.  Neuro: Alert & orientedx3, cranial nerves grossly intact. moves all 4 extremities w/o difficulty. Affect pleasant   Telemetry   Afib 60s, No further NSVT since 1224 06/19/17, personally reviewed.   EKG    No new tracings.   Labs    CBC Recent Labs     06/19/17 0540 06/20/17 0655  WBC 12.0* 14.9*  HGB 13.6 13.9  HCT 42.4 42.2  MCV 86.2 85.8  PLT 335 283   Basic Metabolic Panel Recent Labs    06/19/17 0540 06/20/17 0655  NA 137 136  K 3.7 4.4  CL 100* 100*  CO2 27 26  GLUCOSE 105* 108*  BUN 32* 33*  CREATININE 1.37* 1.40*  CALCIUM 8.2* 8.2*  MG 2.3 2.2   Liver Function Tests No results for input(s): AST, ALT, ALKPHOS, BILITOT, PROT, ALBUMIN in the last 72 hours. No results for input(s): LIPASE, AMYLASE in the last 72 hours. Cardiac Enzymes No results for input(s): CKTOTAL, CKMB, CKMBINDEX, TROPONINI in the last 72 hours.  BNP: BNP (last 3 results) Recent Labs    05/26/17 1226 06/15/17 1931  BNP >4,500.0* 2,293.2*    ProBNP (last 3 results) Recent Labs    04/23/17 1048 05/06/17 1110 06/14/17 1234  PROBNP 10,848* 22,420* 23,607*     D-Dimer No results for input(s): DDIMER in the last 72 hours. Hemoglobin A1C No results for input(s): HGBA1C in the last 72 hours. Fasting Lipid Panel No results for input(s): CHOL, HDL, LDLCALC, TRIG, CHOLHDL, LDLDIRECT in the last 72 hours. Thyroid Function Tests No results for input(s): TSH, T4TOTAL, T3FREE, THYROIDAB in the last 72 hours.  Invalid input(s): FREET3  Other results:   Imaging  No results found.   Medications:     Scheduled Medications: . apixaban  2.5 mg Oral BID  . atorvastatin  20 mg Oral q1800  . carvedilol  12.5 mg Oral BID WC  . famotidine  40 mg Oral QHS  . furosemide  40 mg Oral BID  . hydrALAZINE  12.5 mg Oral BID  . isosorbide mononitrate  15 mg Oral QPM  . lactose free nutrition  237 mL Oral BID BM  . methimazole  10 mg Oral BID  . mexiletine  250 mg Oral Q12H  . mirtazapine  15 mg Oral QHS  . multivitamin with minerals  1 tablet Oral BID  . omega-3 acid ethyl esters  2 g Oral BID  . pantoprazole  40 mg Oral QAC breakfast  . polyethylene glycol  17 g Oral Daily  . potassium chloride  20 mEq Oral Daily  . predniSONE  20 mg  Oral Q breakfast  . sodium chloride flush  3 mL Intravenous Q12H    Infusions: . sodium chloride      PRN Medications: sodium chloride, acetaminophen, albuterol, benzonatate, ondansetron, sodium chloride flush, traMADol    Patient Profile   [name] is a 84 y.o. male with history of hypertension,CADstatus post CABG and pericardial tissue AVR 5093,[id number] systolic HFejection fraction 25-30%s/p CRT-D, PAF (on Eliquis) and orthostatic hypotension  Assessment/Plan   1. Acute on chronic systolic CHF due to ICM s/p CRT-D - Echo 05/27/17 LVEF 20-25%, Grade 2 DD, Stable bioprosthetic AV, Mild MR, Severe LAE, PA peak pressure 35 mm Hg - Cath 05/31/17 with patent CABG stents and moderate PH due to mixture of PVH and PAH WHO group 2 & 3. - Volume status stable on exam.  - Continue lasix 40 mg BID.  - Continue coreg 12.5 mg BID. Low threshold to cut back.  - Continue hydral 12.5 mg BID - Continue imdur 15 mg daily - No ACE/ARB/ARNi due to CKD - UPEP and SPEP pending.  - PYP-scan this am with concerns for amyloidosis with cardiac involvement.  2. Recurrent R pleural effusion - s/p Thoracentesis 05/27/17 with 2.0 L out.  - Chest CT 3/8 with recurrence/worsening - Repeat Thoracentesis 06/17/17 with 2.1 L out. Appears to be transudative with low protein.  Will discuss with IR if they think pt would benefit from repeat, with LARGE residual.    - Repeat Thoracentesis 06/18/17 with 1.1 L out. IR estimates at least 1 L left, but recommended rest in between 3rd attempt if deemed necessary - Will need to consider Pleurex if re-accumulates a third time. Continue to follow. His breathing feels a lot better today.   3. AKI  - Creatinine baseline ~ 1.5.  - Continue to follow.   4. CAD s/p CABG/AVR 2009 - No s/s of ischemia.     5. Recent VT - K 4.4 and Mg 2.2 this am.  - On mexitil 250 mg BID. H - Recently switched from amio to mexitil with SOB and hyperthyroid.   6. Lung  nodules - CT with contrast 06/14/17 with multiple nodules previously noted no longer seen, stable 3 mm LLL nodule noted, new 3 mm RUL nodule noted.  - Has outpatient pulm follow up.  Will discuss with MD if inpatient consult still warranted with improvement of nodules. No change.   7. Chronic AF - Rate controlled.  - Eliquis resume 06/19/17 at 2.5 mg BID.  - He is 84, and creatinine baseline ~ 1.5 when stable.  Would continue  2.5 mg BID at this time.   Medication concerns reviewed with patient and pharmacy team. Barriers identified: None at this time.   Length of Stay: 5  Annamaria Helling  06/20/2017, 10:37 AM  Advanced Heart Failure Team Pager [phone] (M-F; 7a - 4p)  Please contact [facility] for night-coverage after hours (4p -7a ) and weekends on [email or web address]  Patient seen and examined with the above-signed Advanced Practice Provider and/or Housestaff. I personally reviewed laboratory data, imaging studies and relevant notes. I independently examined the patient and formulated the important aspects of the plan. I have edited the note to reflect any of my changes or salient points. I have personally discussed the plan with the patient and/or family.  Much improved today. Ambulating more. Volume status ok. Less NSVT.  PYP scan is normal.   Can go home with close f/u of right pleural effusion. If recurs will need Pleurex.   Glori Bickers, MD  10:56 PM

## 2017-06-20 NOTE — Discharge Summary (Addendum)
Advanced Heart Failure Discharge Note  Discharge Summary   Patient ID: [name] MRN: [id number], DOB/AGE: 1932/12/10 84 y.o. Admit date: 06/15/2017 D/C date:     06/20/2017   Primary Discharge Diagnoses:  1. Acute on chronic systolic CHF due to ICM s/p CRT-D 2. Recurrent R pleural effusion 3. AKI  4. CAD s/p CABG/AVR 2009 5. Non-sustained VT 6. Lung nodules 7. Chronic AF  Hospital Course:   [name] is a 84 y.o. malewith history of hypertension,CADstatus post CABG and pericardial tissue AVR 8675,QGBEEFE systolic HFejection fraction 25-30%s/p CRT-D, PAF (on Eliquis) and orthostatic hypotension  Admitted 06/15/17 after Chest CT showed worsening pleural effusion and labwork showed markedly elevated BNP. Pt diuresed over weekend, and underwent Thoracentesis 06/17/17 with 2.1 L out.  This was stopped due to discomfort, so patient taken back 06/18/17 with another 1.1 L out, since he was already off of Eliquis.   Hospital course additionally complicated by NSVT, with runs of up to 40 beats.  ICD interrogation showed no prolonged episodes or therapies.  Electrolytes supped aggressively.  Mexitil not increased as had previously  Caused GI upset.  Pt has very close EP follow up as below.   Pt diuresed on IV lasix then transitioned back to home po meds. UPEP and SPEP sent with concerns for cardiac amyloidosis. Tc-PYP-scan performed 06/20/17 was NOT suggestive of TTR amyloid. SPEP without M-spike.   Discussed at length with patient that if effusion continues to quickly re-accumulate, will have low threshold to proceed to Pleurx catheter.   Pt seen in am of 06/20/17 and thought stable for discharge. PYP scan as below. Pt will be discharged to home in stable condition with very close EP and HF follow up as below.   Discharge Weight Range: 170 lbs Discharge Vitals: Blood pressure (!) 125/54, pulse 63, temperature 97.6 F (36.4 C), resp. rate 18, height 6' (1.829 m), weight 170 lb  3.2 oz (77.2 kg), SpO2 95 %.  Labs: Lab Results  Component Value Date   WBC 14.9 (H) 06/20/2017   HGB 13.9 06/20/2017   HCT 42.2 06/20/2017   MCV 85.8 06/20/2017   PLT 342 06/20/2017    Recent Labs  Lab 06/15/17 1931  06/20/17 0655  NA 145   < > 136  K 4.4   < > 4.4  CL 106   < > 100*  CO2 27   < > 26  BUN 33*   < > 33*  CREATININE 1.80*   < > 1.40*  CALCIUM 8.4*   < > 8.2*  PROT 5.9*  --   --   BILITOT 0.6  --   --   ALKPHOS 63  --   --   ALT 35  --   --   AST 37  --   --   GLUCOSE 88   < > 108*   < > = values in this interval not displayed.   Lab Results  Component Value Date   CHOL 126 07/30/2016   HDL 45 07/30/2016   LDLCALC 50 07/30/2016   TRIG 154 (H) 07/30/2016   BNP (last 3 results) Recent Labs    05/26/17 1226 06/15/17 1931  BNP >4,500.0* 2,293.2*    ProBNP (last 3 results) Recent Labs    04/23/17 1048 05/06/17 1110 06/14/17 1234  PROBNP 10,848* 22,420* 23,607*     Diagnostic Studies/Procedures   Nm Tumor Localization W Spect  Result Date: 06/20/2017 CLINICAL DATA:  HEART FAILURE. CONCERN FOR CARDIAC AMYLOIDOSIS.  EXAM: NUCLEAR MEDICINE TUMOR LOCALIZATION. PYP CARDIAC AMYLOIDOSIS SCAN WITH SPECT TECHNIQUE: Following intravenous administration of radiopharmaceutical, anterior planar images of the chest were obtained. Regions of interest were placed on the heart and contralateral chest wall for quantitative assessment. Additional SPECT imaging of the chest was obtained. RADIOPHARMACEUTICALS:  19.0 mCi TECHNETIUM 99 PYROPHOSPHATE FINDINGS: Planar Visual assessment: Anterior planar imaging demonstrates radiotracer uptake within the heart less than than uptake within the adjacent ribs (Grade 1). Quantitative assessment : Quantitative assessment of the cardiac uptake compared to the contralateral chest wall is equal to 1.0 (H/CL = 1.0). SPECT assessment: SPECT imaging of the chest demonstrates faint radiotracer accumulation within the LEFT ventricle.  IMPRESSION: Visual and quantitative assessment (grade 1, H/CLL equal 1.0) are not suggestive of transthyretin amyloidosis. Electronically Signed   By: Suzy Bouchard M.D.   On: 06/20/2017 14:21   CT with contrast 3/8/19with multiple nodules previously noted no longer seen, stable 3 mm LLL nodule noted, new 3 mm RUL nodule noted.   Discharge Medications   Allergies as of 06/20/2017      Reactions   Novocain [procaine] Swelling      Medication List    TAKE these medications   acetaminophen 500 MG tablet Commonly known as:  TYLENOL Take 1,000 mg by mouth every 6 (six) hours as needed for headache (pain).   albuterol 108 (90 Base) MCG/ACT inhaler Commonly known as:  PROVENTIL HFA;VENTOLIN HFA Inhale 1-2 puffs into the lungs every 6 (six) hours as needed for wheezing.   atorvastatin 20 MG tablet Commonly known as:  LIPITOR TAKE 1 TABLET (20 MG TOTAL) DAILY AT 6 PM.   benzonatate 200 MG capsule Commonly known as:  TESSALON Take 1 capsule (200 mg total) by mouth 2 (two) times daily as needed for cough.   carvedilol 12.5 MG tablet Commonly known as:  COREG Take 12.5 mg by mouth 2 (two) times daily.   ELIQUIS 2.5 MG Tabs tablet Generic drug:  apixaban TAKE 1 TABLET TWICE DAILY What changed:    how much to take  how to take this  when to take this   lactose free nutrition Liqd Take 237 mLs by mouth 3 (three) times daily as needed (meal supplement).   feeding supplement (ENSURE ENLIVE) Liqd Take 237 mLs by mouth 2 (two) times daily between meals.   feeding supplement (PRO-STAT SUGAR FREE 64) Liqd Take 30 mLs by mouth daily.   furosemide 40 MG tablet Commonly known as:  LASIX Take 1 tablet (40 mg total) by mouth 2 (two) times daily.   hydrALAZINE 25 MG tablet Commonly known as:  APRESOLINE Take 12.5 mg by mouth 2 (two) times daily.   isosorbide mononitrate 30 MG 24 hr tablet Commonly known as:  IMDUR Take 0.5 tablets (15 mg total) by mouth every evening.     methimazole 10 MG tablet Commonly known as:  TAPAZOLE Take 1 tablet (10 mg total) by mouth 2 (two) times daily.   mexiletine 250 MG capsule Commonly known as:  MEXITIL Take 1 capsule (250 mg total) by mouth 2 (two) times daily.   mirtazapine 15 MG tablet Commonly known as:  REMERON Take 1 tablet (15 mg total) by mouth at bedtime.   multivitamin with minerals Tabs tablet Take 1 tablet by mouth 2 (two) times daily.   omega-3 acid ethyl esters 1 g capsule Commonly known as:  LOVAZA Take 2 g by mouth 2 (two) times daily.   ondansetron 4 MG disintegrating tablet Commonly known as:  ZOFRAN-ODT Take 4 mg by mouth every 8 (eight) hours as needed for nausea or vomiting.   pantoprazole 40 MG tablet Commonly known as:  PROTONIX Take 1 tablet (40 mg total) by mouth daily before breakfast.   potassium chloride SA 20 MEQ tablet Commonly known as:  K-DUR,KLOR-CON Take 1 tablet (20 mEq total) by mouth daily. Start taking on:  06/21/2017   predniSONE 10 MG tablet Commonly known as:  DELTASONE Take 2 tablets (20 mg total) by mouth daily with breakfast.   ranitidine 300 MG tablet Commonly known as:  ZANTAC Take 300 mg by mouth at bedtime.   traMADol 50 MG tablet Commonly known as:  ULTRAM Take 1 tablet (50 mg total) by mouth every 12 (twelve) hours as needed for moderate pain or severe pain.       Disposition   The patient will be discharged in stable condition to home. Discharge Instructions    (HEART FAILURE PATIENTS) Call MD:  Anytime you have any of the following symptoms: 1) 3 pound weight gain in 24 hours or 5 pounds in 1 week 2) shortness of breath, with or without a dry hacking cough 3) swelling in the hands, feet or stomach 4) if you have to sleep on extra pillows at night in order to breathe.   Complete by:  As directed    Diet - low sodium heart healthy   Complete by:  As directed    Heart Failure patients record your daily weight using the same scale at the same time  of day   Complete by:  As directed    Increase activity slowly   Complete by:  As directed      Follow-up Information    [facility] Follow up on 06/27/2017.   Specialty:  Cardiology Why:  at 1200 for post hospital follow up. Code for parking is [place].   Contact information: [address] [id number] mc [place] [phone]       Constance Haw, MD Follow up on 06/24/2017.   Specialty:  Cardiology Why:  at [address] Contact information: [address] [place] [address] [phone]             Duration of Discharge Encounter: Greater than 35 minutes   Signed, Annamaria Helling 06/20/2017, 3:56 PM  Patient seen and examined with the above-signed Advanced Practice Provider and/or Housestaff. I personally reviewed laboratory data, imaging studies and relevant notes. I independently examined the patient and formulated the important aspects of the plan. I have edited the note to reflect any of my changes or salient points. I have personally discussed the plan with the patient and/or family.  Much improved today. Ambulating more. Volume status ok. Less NSVT.  PYP scan is normal.   Can go home with close f/u of right pleural effusion. If recurs will need Pleurex.   Glori Bickers, MD  10:57 PM

## 2017-06-21 ENCOUNTER — Other Ambulatory Visit: Payer: Self-pay | Admitting: *Deleted

## 2017-06-21 ENCOUNTER — Encounter (HOSPITAL_COMMUNITY): Payer: Medicare HMO

## 2017-06-21 ENCOUNTER — Other Ambulatory Visit: Payer: Self-pay

## 2017-06-21 ENCOUNTER — Telehealth: Payer: Self-pay | Admitting: Cardiology

## 2017-06-21 DIAGNOSIS — I5042 Chronic combined systolic (congestive) and diastolic (congestive) heart failure: Secondary | ICD-10-CM | POA: Diagnosis not present

## 2017-06-21 DIAGNOSIS — N183 Chronic kidney disease, stage 3 (moderate): Secondary | ICD-10-CM | POA: Diagnosis not present

## 2017-06-21 DIAGNOSIS — I429 Cardiomyopathy, unspecified: Secondary | ICD-10-CM | POA: Diagnosis not present

## 2017-06-21 DIAGNOSIS — E0590 Thyrotoxicosis, unspecified without thyrotoxic crisis or storm: Secondary | ICD-10-CM | POA: Diagnosis not present

## 2017-06-21 DIAGNOSIS — E440 Moderate protein-calorie malnutrition: Secondary | ICD-10-CM | POA: Diagnosis not present

## 2017-06-21 DIAGNOSIS — I2510 Atherosclerotic heart disease of native coronary artery without angina pectoris: Secondary | ICD-10-CM | POA: Diagnosis not present

## 2017-06-21 DIAGNOSIS — I481 Persistent atrial fibrillation: Secondary | ICD-10-CM | POA: Diagnosis not present

## 2017-06-21 DIAGNOSIS — K259 Gastric ulcer, unspecified as acute or chronic, without hemorrhage or perforation: Secondary | ICD-10-CM | POA: Diagnosis not present

## 2017-06-21 DIAGNOSIS — I130 Hypertensive heart and chronic kidney disease with heart failure and stage 1 through stage 4 chronic kidney disease, or unspecified chronic kidney disease: Secondary | ICD-10-CM | POA: Diagnosis not present

## 2017-06-21 DIAGNOSIS — R918 Other nonspecific abnormal finding of lung field: Secondary | ICD-10-CM

## 2017-06-21 LAB — IMMUNOFIXATION, URINE

## 2017-06-21 NOTE — Telephone Encounter (Signed)
[name] calling from [facility] requesting that Dr. Radford Pax approve orders for Skilled Nursing to be done in the home. She states that it would be 2 visits a week x2 weeks and then 1 visit a week x 1 week. Made [name] aware that we typically have PCP manage these orders. Made her aware that I would forward to Dr. Radford Pax for review and recommendation.

## 2017-06-21 NOTE — Telephone Encounter (Signed)
Morey Hummingbird calling with [facility] that she needs Resumption of Care orders.

## 2017-06-21 NOTE — Patient Outreach (Signed)
Pt hospitalized 3/9-3/14/19 for right pleural effusion, telephone call to pt for transition of care week 1, spoke with pt, HIPAA verified, pt reports "I'm feeling so much better"  Pt has follow up appointments in place, home health is working with pt.  Patient's family involved in his care, pt reports he has all medications and taking as prescribed.  Outpatient Encounter Medications as of 06/21/2017  Medication Sig Note  . acetaminophen (TYLENOL) 500 MG tablet Take 1,000 mg by mouth every 6 (six) hours as needed for headache (pain).    Marland Kitchen albuterol (PROVENTIL HFA;VENTOLIN HFA) 108 (90 Base) MCG/ACT inhaler Inhale 1-2 puffs into the lungs every 6 (six) hours as needed for wheezing.    Marland Kitchen atorvastatin (LIPITOR) 20 MG tablet TAKE 1 TABLET (20 MG TOTAL) DAILY AT 6 PM.   . benzonatate (TESSALON) 200 MG capsule Take 1 capsule (200 mg total) by mouth 2 (two) times daily as needed for cough.   . carvedilol (COREG) 12.5 MG tablet Take 12.5 mg by mouth 2 (two) times daily.   Marland Kitchen ELIQUIS 2.5 MG TABS tablet TAKE 1 TABLET TWICE DAILY (Patient taking differently: TAKE 1 TABLET (2.5 MG) BY MOUTH TWICE DAILY)   . feeding supplement, ENSURE ENLIVE, (ENSURE ENLIVE) LIQD Take 237 mLs by mouth 2 (two) times daily between meals. 06/15/2017: See entry for Boost  . furosemide (LASIX) 40 MG tablet Take 1 tablet (40 mg total) by mouth 2 (two) times daily.   . hydrALAZINE (APRESOLINE) 25 MG tablet Take 12.5 mg by mouth 2 (two) times daily.   . isosorbide mononitrate (IMDUR) 30 MG 24 hr tablet Take 0.5 tablets (15 mg total) by mouth every evening.   . lactose free nutrition (BOOST) LIQD Take 237 mLs by mouth 3 (three) times daily as needed (meal supplement).   . methimazole (TAPAZOLE) 10 MG tablet Take 1 tablet (10 mg total) by mouth 2 (two) times daily.   Marland Kitchen mexiletine (MEXITIL) 250 MG capsule Take 1 capsule (250 mg total) by mouth 2 (two) times daily.   . mirtazapine (REMERON) 15 MG tablet Take 1 tablet (15 mg total) by mouth at  bedtime.   . Multiple Vitamin (MULTIVITAMIN WITH MINERALS) TABS tablet Take 1 tablet by mouth 2 (two) times daily.    Marland Kitchen omega-3 acid ethyl esters (LOVAZA) 1 G capsule Take 2 g by mouth 2 (two) times daily.   . ondansetron (ZOFRAN-ODT) 4 MG disintegrating tablet Take 4 mg by mouth every 8 (eight) hours as needed for nausea or vomiting.    . pantoprazole (PROTONIX) 40 MG tablet Take 1 tablet (40 mg total) by mouth daily before breakfast.   . potassium chloride SA (K-DUR,KLOR-CON) 20 MEQ tablet Take 1 tablet (20 mEq total) by mouth daily.   . predniSONE (DELTASONE) 10 MG tablet Take 2 tablets (20 mg total) by mouth daily with breakfast. 06/15/2017: #60 filled 05/28/17 / 3 refills  . ranitidine (ZANTAC) 300 MG tablet Take 300 mg by mouth at bedtime.   . traMADol (ULTRAM) 50 MG tablet Take 1 tablet (50 mg total) by mouth every 12 (twelve) hours as needed for moderate pain or severe pain. 05/26/2017: #14 filled 05/10/17 [facility] per PMP AWARE  . Amino Acids-Protein Hydrolys (FEEDING SUPPLEMENT, PRO-STAT SUGAR FREE 64,) LIQD Take 30 mLs by mouth daily. (Patient not taking: Reported on 06/15/2017)    No facility-administered encounter medications on file as of 06/21/2017.     [facility] CM Care Plan Problem One     Most Recent Value  Care Plan Problem One  Knowledge deficit related to CHF  Role Documenting the Problem One  Care Management Coordinator  Care Plan for Problem One  Active  [facility] Long Term Goal   Pt will verbalize/ demonstrate better understanding self care for CHF within 60 days  [facility] Long Term Goal Start Date  06/06/17  Interventions for Problem One Long Term Goal  RN CM reviewed medications over the phone with pt,, reviewed all upcoming appointments  [facility] CM Short Term Goal #1   Pt will verbalize CHF zones / action plan within 30 days  [facility] CM Short Term Goal #1 Start Date  06/06/17  Interventions for Short Term Goal #1  RN CM reviewed CHF zones/ action plan , reviewed calling cardiologist for weight gain,  edema, change in symptoms, reviewed calling MD for symptoms of pleural effusion, reviewed signs/ symptoms of pleural effusion  [facility] CM Short Term Goal #2   Pt will weigh daily and record within 30 days  [facility] CM Short Term Goal #2 Start Date  06/06/17  Interventions for Short Term Goal #2  Pt is weighing daily    [facility] CM Care Plan Problem Two     Most Recent Value  Care Plan Problem Two  Pt has decreased endurance  Role Documenting the Problem Two  Care Management Rule for Problem Two  Active  [facility] CM Short Term Goal #1   Pt will work with home health PT and complete prescribed exercises, verbalize increase endurance within 30 days  [facility] CM Short Term Goal #1 Start Date  06/12/17  Interventions for Short Term Goal #2   Pt is working with home health      PLAN Continue weekly transition of care calls See pt for home visit next month  Jacqlyn Larsen [facility], [place] [phone]

## 2017-06-21 NOTE — Telephone Encounter (Signed)
I am fine with that 

## 2017-06-21 NOTE — Telephone Encounter (Signed)
F/U   The correct call number is [phone]

## 2017-06-22 ENCOUNTER — Encounter (HOSPITAL_COMMUNITY): Payer: Medicare HMO

## 2017-06-22 LAB — CULTURE, BODY FLUID-BOTTLE: CULTURE: NO GROWTH

## 2017-06-22 LAB — CULTURE, BODY FLUID W GRAM STAIN -BOTTLE

## 2017-06-23 LAB — CULTURE, BODY FLUID W GRAM STAIN -BOTTLE: Culture: NO GROWTH

## 2017-06-23 LAB — CULTURE, BODY FLUID-BOTTLE

## 2017-06-24 ENCOUNTER — Encounter: Payer: Self-pay | Admitting: Cardiology

## 2017-06-24 ENCOUNTER — Ambulatory Visit: Payer: Medicare HMO | Admitting: Cardiology

## 2017-06-24 VITALS — BP 120/68 | HR 71 | Ht 72.0 in | Wt 175.4 lb

## 2017-06-24 DIAGNOSIS — I480 Paroxysmal atrial fibrillation: Secondary | ICD-10-CM

## 2017-06-24 DIAGNOSIS — I255 Ischemic cardiomyopathy: Secondary | ICD-10-CM

## 2017-06-24 DIAGNOSIS — I472 Ventricular tachycardia, unspecified: Secondary | ICD-10-CM

## 2017-06-24 DIAGNOSIS — I25810 Atherosclerosis of coronary artery bypass graft(s) without angina pectoris: Secondary | ICD-10-CM

## 2017-06-24 DIAGNOSIS — Z4502 Encounter for adjustment and management of automatic implantable cardiac defibrillator: Secondary | ICD-10-CM

## 2017-06-24 MED ORDER — MEXILETINE HCL 250 MG PO CAPS: 250.0000 mg | ORAL_CAPSULE | Freq: Three times a day (TID) | ORAL | 6 refills | Status: DC

## 2017-06-24 NOTE — Telephone Encounter (Signed)
Called and made [facility] with [facility] aware that Dr. Radford Pax was okay with the orders below.

## 2017-06-24 NOTE — Patient Instructions (Signed)
Medication Instructions:  Your physician has recommended you make the following change in your medication:  1. INCREASE Mexiletine to 250 mg three times daily  Labwork: None ordered  Testing/Procedures: None ordered  Follow-Up: Your physician wants you to follow-up in: 6 months with Dr. Curt Bears.  You will receive a reminder letter in the mail two months in advance. If you don't receive a letter, please call our office to schedule the follow-up appointment.  * If you need a refill on your cardiac medications before your next appointment, please call your pharmacy.   *Please note that any paperwork needing to be filled out by the provider will need to be addressed at the front desk prior to seeing the provider. Please note that any FMLA, disability or other documents regarding health condition is subject to a $25.00 charge that must be received prior to completion of paperwork in the form of a money order or check.  Thank you for choosing [facility]!!   Trinidad Curet, RN [phone]

## 2017-06-24 NOTE — Progress Notes (Signed)
Electrophysiology Office Note   Date:  06/24/2017   ID:  [name], DOB 03/08/1933, MRN [id number]  PCP:  [name], MD  Cardiologist:  [name] Primary Electrophysiologist: Kassey Laforest Meredith Leeds, MD    No chief complaint on file.    History of Present Illness: [name] is a 84 y.o. male who presents today for electrophysiology evaluation.   He has a history of hypertension, aortic stenosis status post AVR, chronic combined systolic and diastolic CHF in the setting of A. fib with RVR, orthostatic hypotension, dilated cardiomyopathy EF 25-30%, status post CABG, and paroxysmal atrial fibrillation.  He does have paroxysmal atrial fibrillation has been cardioverted in the past and is now on a fixed within with rhythm control via amiodarone. He had CRT-D upgrade on 04/22/15.  He was having VT below his detection limit.  Device limits were changed in the last visit and he was put on mexiletine.  He was recently hospitalized for systolic heart failure with aggressive diuresis and thoracentesis.  He had a wide-complex tachycardia during hospitalization at a rate of approximately 110 bpm.  Today, denies symptoms of palpitations, chest pain, shortness of breath, orthopnea, PND, lower extremity edema, claudication, dizziness, presyncope, syncope, bleeding, or neurologic sequela. The patient is tolerating medications without difficulties.  Main complaint is of significant weakness and fatigue.  He sleeps up to 12 hours a day.  Past Medical History:  Diagnosis Date  . AICD (automatic cardioverter/defibrillator) present   . Arthritis    "minor everywhere" (04/22/2015)  . Asthma    "a touch"  . Barrett esophagus   . CAD (coronary artery disease)    a. s/p 2 vessel CABG with LIMA to LAD, SVG to diagonal 1, SVG to RCA 12/09.  Marland Kitchen Chronic combined systolic and diastolic CHF (congestive heart failure) (HCC)    dry weight 197-200lbs.  . CKD (chronic kidney disease), stage III ([place])   .  Diabetes ([place])    "I'm prediabetic" (04/22/2015)  . Dyspnea   . GERD (gastroesophageal reflux disease)   . H/O: rheumatic fever   . Hepatitis 1957   "don't know what kind:  . History of hiatal hernia   . History of PFTs    a. Amiodarone started 5/16 >> PFTs w/ DLCO 6/16:  FEV1 72% predicted, FEV1/FVC 66%, DLCO 66% >> minimal reversible obstructive airways disease with mild diffusion defect (suggestive of emphysema but absence of hyperinflation inconsistent with dx)  . HTN (hypertension)   . Hypercholesteremia   . NICM (nonischemic cardiomyopathy) ([place])    EF 15-20% by echo 2017  . Orthostatic hypotension   . PAF (paroxysmal atrial fibrillation) ([place])   . Pneumonia 08/2014   "dr thought I may have had a touch"  . S/P AVR (aortic valve replacement) 2009   a. severe AS s/p AVR with pericardial tissue valve 2009.  Marland Kitchen Ventricular tachycardia [facility])    Past Surgical History:  Procedure Laterality Date  . AORTIC VALVE REPLACEMENT  with 23-mm Magna Ease pericardial valve, model number   with 23-mm Magna Ease pericardial valve, model [id number], serial number [id number]   . APPENDECTOMY  1940s  . BI-VENTRICULAR IMPLANTABLE CARDIOVERTER DEFIBRILLATOR  (CRT-D)  04/22/2015  . CARDIAC CATHETERIZATION N/A 11/04/2014   Procedure: Left Heart Cath and Cors/Grafts Angiography;  Surgeon: Leonie Man, MD;  Location: [place] [facility];  Service: Cardiovascular;  Laterality: N/A;  . CARDIAC VALVE REPLACEMENT    . CARDIOVERSION N/A 01/27/2013   Procedure: CARDIOVERSION;  Surgeon: Eber Hong  Radford Pax, MD;  Location: [place];  Service: Cardiovascular;  Laterality: N/A;  . CARDIOVERSION N/A 08/25/2014   Procedure: CARDIOVERSION;  Surgeon: Sanda Klein, MD;  Location: [facility];  Service: Cardiovascular;  Laterality: N/A;  . CATARACT EXTRACTION W/ INTRAOCULAR LENS  IMPLANT, BILATERAL Bilateral   . CORONARY ARTERY BYPASS GRAFT  03/10/2008   x 3 Dr. Roxan Hockey  . EP IMPLANTABLE DEVICE N/A 04/22/2015    Procedure: BiV ICD Insertion CRT-D;  Surgeon: Suvi Archuletta Meredith Leeds, MD;  Location: [place] [facility];  Service: Cardiovascular;  Laterality: N/A;  . ESOPHAGOGASTRODUODENOSCOPY (EGD) WITH ESOPHAGEAL DILATION  X1  . ESOPHAGOGASTRODUODENOSCOPY (EGD) WITH PROPOFOL N/A 05/09/2017   Procedure: ESOPHAGOGASTRODUODENOSCOPY (EGD) WITH PROPOFOL;  Surgeon: Carol Ada, MD;  Location: [facility];  Service: Endoscopy;  Laterality: N/A;  . IR THORACENTESIS ASP PLEURAL SPACE W/IMG GUIDE  05/27/2017  . IR THORACENTESIS ASP PLEURAL SPACE W/IMG GUIDE  06/17/2017  . IR THORACENTESIS ASP PLEURAL SPACE W/IMG GUIDE  06/18/2017  . RIGHT HEART CATH AND CORONARY/GRAFT ANGIOGRAPHY N/A 05/31/2017   Procedure: RIGHT HEART CATH AND CORONARY/GRAFT ANGIOGRAPHY;  Surgeon: Jolaine Artist, MD;  Location: [place] [facility];  Service: Cardiovascular;  Laterality: N/A;  . TONSILLECTOMY       Current Outpatient Medications  Medication Sig Dispense Refill  . acetaminophen (TYLENOL) 500 MG tablet Take 1,000 mg by mouth every 6 (six) hours as needed for headache (pain).     Marland Kitchen albuterol (PROVENTIL HFA;VENTOLIN HFA) 108 (90 Base) MCG/ACT inhaler Inhale 1-2 puffs into the lungs every 6 (six) hours as needed for wheezing.     . Amino Acids-Protein Hydrolys (FEEDING SUPPLEMENT, PRO-STAT SUGAR FREE 64,) LIQD Take 30 mLs by mouth daily. (Patient not taking: Reported on 06/15/2017) 900 mL 0  . atorvastatin (LIPITOR) 20 MG tablet TAKE 1 TABLET (20 MG TOTAL) DAILY AT 6 PM. 90 tablet 2  . benzonatate (TESSALON) 200 MG capsule Take 1 capsule (200 mg total) by mouth 2 (two) times daily as needed for cough. 20 capsule 0  . carvedilol (COREG) 12.5 MG tablet Take 12.5 mg by mouth 2 (two) times daily.    Marland Kitchen ELIQUIS 2.5 MG TABS tablet TAKE 1 TABLET TWICE DAILY (Patient taking differently: TAKE 1 TABLET (2.5 MG) BY MOUTH TWICE DAILY) 180 tablet 2  . feeding supplement, ENSURE ENLIVE, (ENSURE ENLIVE) LIQD Take 237 mLs by mouth 2 (two) times daily  between meals. 60 Bottle 0  . furosemide (LASIX) 40 MG tablet Take 1 tablet (40 mg total) by mouth 2 (two) times daily. 60 tablet 0  . hydrALAZINE (APRESOLINE) 25 MG tablet Take 12.5 mg by mouth 2 (two) times daily.    . isosorbide mononitrate (IMDUR) 30 MG 24 hr tablet Take 0.5 tablets (15 mg total) by mouth every evening.    . lactose free nutrition (BOOST) LIQD Take 237 mLs by mouth 3 (three) times daily as needed (meal supplement).    . methimazole (TAPAZOLE) 10 MG tablet Take 1 tablet (10 mg total) by mouth 2 (two) times daily. 180 tablet 0  . mexiletine (MEXITIL) 250 MG capsule Take 1 capsule (250 mg total) by mouth 2 (two) times daily. 60 capsule 3  . mirtazapine (REMERON) 15 MG tablet Take 1 tablet (15 mg total) by mouth at bedtime. 30 tablet 0  . Multiple Vitamin (MULTIVITAMIN WITH MINERALS) TABS tablet Take 1 tablet by mouth 2 (two) times daily.     Marland Kitchen omega-3 acid ethyl esters (LOVAZA) 1 G capsule Take 2 g by mouth 2 (  two) times daily.    . ondansetron (ZOFRAN-ODT) 4 MG disintegrating tablet Take 4 mg by mouth every 8 (eight) hours as needed for nausea or vomiting.     . pantoprazole (PROTONIX) 40 MG tablet Take 1 tablet (40 mg total) by mouth daily before breakfast. 30 tablet 3  . potassium chloride SA (K-DUR,KLOR-CON) 20 MEQ tablet Take 1 tablet (20 mEq total) by mouth daily. 30 tablet 3  . predniSONE (DELTASONE) 10 MG tablet Take 2 tablets (20 mg total) by mouth daily with breakfast. 60 tablet 3  . ranitidine (ZANTAC) 300 MG tablet Take 300 mg by mouth at bedtime.    . traMADol (ULTRAM) 50 MG tablet Take 1 tablet (50 mg total) by mouth every 12 (twelve) hours as needed for moderate pain or severe pain. 20 tablet 0   No current facility-administered medications for this visit.     Allergies:   Novocain [procaine]   Social History:  The patient  reports that  has never smoked. he has never used smokeless tobacco. He reports that he drinks alcohol. He reports that he does not use  drugs.   Family History:  The patient's family history includes Alzheimer's disease in his mother and unknown relative; COPD in his daughter and sister; Depression in his sister; Esophageal cancer in his sister; Heart disease in his sister; Hyperlipidemia in his sister; Hypertension in his sister.   ROS:  Please see the history of present illness.   Otherwise, review of systems is positive for weakness, fatigue.   All other systems are reviewed and negative.   PHYSICAL EXAM: VS:  There were no vitals taken for this visit. , BMI There is no height or weight on file to calculate BMI. GEN: Well nourished, well developed, in no acute distress  HEENT: normal  Neck: no JVD, carotid bruits, or masses Cardiac: RRR; no murmurs, rubs, or gallops,no edema  Respiratory:  clear to auscultation bilaterally, normal work of breathing GI: soft, nontender, nondistended, + BS MS: no deformity or atrophy  Skin: warm and dry, device site well healed Neuro:  Strength and sensation are intact Psych: euthymic mood, full affect  EKG:  EKG is not ordered today. Personal review of the ekg ordered 06/19/17 shows wide complex tachycardia, rate 108  Personal review of the device interrogation today. Results in [place]: 05/26/2017: TSH <0.010 06/14/2017: NT-Pro BNP 23,607 06/15/2017: ALT 35; B Natriuretic Peptide 2,293.2 06/20/2017: BUN 33; Creatinine, Ser 1.40; Hemoglobin 13.9; Magnesium 2.2; Platelets 342; Potassium 4.4; Sodium 136    Lipid Panel     Component Value Date/Time   CHOL 126 07/30/2016 1143   CHOL 93 (L) 06/16/2014 0914   TRIG 154 (H) 07/30/2016 1143   TRIG 124 06/16/2014 0914   HDL 45 07/30/2016 1143   HDL 34 (L) 06/16/2014 0914   CHOLHDL 2.8 07/30/2016 1143   CHOLHDL 2.7 01/19/2015 1005   VLDL 32 (H) 01/19/2015 1005   LDLCALC 50 07/30/2016 1143   LDLCALC 34 06/16/2014 0914     Wt Readings from Last 3 Encounters:  06/20/17 170 lb 3.2 oz (77.2 kg)  06/14/17 183 lb 3.2 oz  (83.1 kg)  06/12/17 179 lb (81.2 kg)      Other studies Reviewed: Additional studies/ records that were reviewed today include: TTE 05/29/17 Review of the above records today demonstrates:  - Left ventricle: The cavity size was normal. Wall thickness was   increased in a pattern of mild LVH. Systolic function was  severely reduced. The estimated ejection fraction was in the   range of 20% to 25%. There is akinesis of the inferolateral,   inferior, and inferoseptal myocardium. Features are consistent   with a pseudonormal left ventricular filling pattern, with   concomitant abnormal relaxation and increased filling pressure   (grade 2 diastolic dysfunction). - Aortic valve: A pericardial tissue valve bioprosthesis was   present and functioning normally. Peak velocity (S): 226 cm/s.   Mean gradient (S): 10 mm Hg. Valve area (VTI): 1.52 cm^2. Valve   area (Vmax): 1.5 cm^2. Valve area (Vmean): 1.59 cm^2. - Mitral valve: Calcified annulus. Mildly thickened leaflets .   There was mild regurgitation. - Left atrium: The atrium was severely dilated. - Right ventricle: Pacer wire or catheter noted in right ventricle. - Pulmonary arteries: Systolic pressure was mildly increased. PA   peak pressure: 35 mm Hg (S).  RHC/LHC 05/31/17  Prox LAD to Mid LAD lesion is 50% stenosed.  Mid LAD to Dist LAD lesion is 25% stenosed.  1st Mrg lesion is 50% stenosed.  Prox RCA to Dist RCA lesion is 100% stenosed.  And is large.  Prox Graft to Mid Graft lesion is 30% stenosed.  And is large.  The flow in the graft is reversed.  There is competitive flow.  And is large.  There is competitive flow.  Ost 1st Diag to 1st Diag lesion is 90% stenosed.   Findings:  Ao = 145/67 (99)  RA = 6 RV = 70/9 PA = 69/30 (46) PCW = 26 v = 37 Fick cardiac output/index =4.7/2.3 PVR = 4.3 FA sat = 98% PA sat = 63%, 60%  Assessment:  1. 3v CAD with stable revascularization with patent LIMA-LAD,  SVG-diagonal and SVG-RCA 2. 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 Normal cardiac output - no evidence of thyrotoxicosis heart disease or PH   ASSESSMENT AND PLAN:  1.  Chronic combined systolic and diastolic congestive heart failure: Medtronic CRT-D implanted 04/22/15.  Has been taken off of amiodarone and put on mexiletine.  He has had episodes of wide-complex tachycardia with a rate of approximately 110.  This is likely due to ventricular tachycardia based on his prior history.  We Arion Morgan thus increase his mexiletine to 250 mg 3 times a day.  I did discuss with him possible side effects of mexiletine.  He has follow-up in heart failure clinic on Thursday.  Volume status seems relatively stable.  2. Paroxysmal atrial fibrillation: On Eliquis.  Has been taken off of amiodarone.  Remains in sinus rhythm.  This patients CHA2DS2-VASc Score and unadjusted Ischemic Stroke Rate (% per year) is equal to 7.2 % stroke rate/year from a score of 5  Above score calculated as 1 point each if present [CHF, HTN, DM, Vascular=MI/PAD/Aortic Plaque, Age if 65-74, or Male] Above score calculated as 2 points each if present [Age > 75, or Stroke/TIA/TE]   3.  Ventricular tachycardia: On device interrogation.  Was put on mexiletine at last visit.  Is continuing to have episodes of slow wide-complex tachycardia.  Plan to increase mexiletine to 250 mg 3 times a day.  Current medicines are reviewed at length with the patient today.   The patient does not have concerns regarding his medicines.  The following changes were made today: Increase mexiletine  Labs/ tests ordered today include:  No orders of the defined types were placed in this encounter.  Discussed with primary  cardiology  Disposition:   FU with Addysen Louth 6 months  Signed, all of Arine Foley Meredith Leeds, MD  06/24/2017 1:17 PM     [place]   [phone] (office) [phone] (fax)

## 2017-06-25 DIAGNOSIS — N183 Chronic kidney disease, stage 3 (moderate): Secondary | ICD-10-CM | POA: Diagnosis not present

## 2017-06-25 DIAGNOSIS — K259 Gastric ulcer, unspecified as acute or chronic, without hemorrhage or perforation: Secondary | ICD-10-CM | POA: Diagnosis not present

## 2017-06-25 DIAGNOSIS — I5042 Chronic combined systolic (congestive) and diastolic (congestive) heart failure: Secondary | ICD-10-CM | POA: Diagnosis not present

## 2017-06-25 DIAGNOSIS — I130 Hypertensive heart and chronic kidney disease with heart failure and stage 1 through stage 4 chronic kidney disease, or unspecified chronic kidney disease: Secondary | ICD-10-CM | POA: Diagnosis not present

## 2017-06-25 DIAGNOSIS — I2510 Atherosclerotic heart disease of native coronary artery without angina pectoris: Secondary | ICD-10-CM | POA: Diagnosis not present

## 2017-06-25 DIAGNOSIS — E0590 Thyrotoxicosis, unspecified without thyrotoxic crisis or storm: Secondary | ICD-10-CM | POA: Diagnosis not present

## 2017-06-25 DIAGNOSIS — I429 Cardiomyopathy, unspecified: Secondary | ICD-10-CM | POA: Diagnosis not present

## 2017-06-25 DIAGNOSIS — E440 Moderate protein-calorie malnutrition: Secondary | ICD-10-CM | POA: Diagnosis not present

## 2017-06-25 DIAGNOSIS — I481 Persistent atrial fibrillation: Secondary | ICD-10-CM | POA: Diagnosis not present

## 2017-06-26 DIAGNOSIS — I2510 Atherosclerotic heart disease of native coronary artery without angina pectoris: Secondary | ICD-10-CM | POA: Diagnosis not present

## 2017-06-26 DIAGNOSIS — N183 Chronic kidney disease, stage 3 (moderate): Secondary | ICD-10-CM | POA: Diagnosis not present

## 2017-06-26 DIAGNOSIS — I429 Cardiomyopathy, unspecified: Secondary | ICD-10-CM | POA: Diagnosis not present

## 2017-06-26 DIAGNOSIS — I481 Persistent atrial fibrillation: Secondary | ICD-10-CM | POA: Diagnosis not present

## 2017-06-26 DIAGNOSIS — E440 Moderate protein-calorie malnutrition: Secondary | ICD-10-CM | POA: Diagnosis not present

## 2017-06-26 DIAGNOSIS — E0590 Thyrotoxicosis, unspecified without thyrotoxic crisis or storm: Secondary | ICD-10-CM | POA: Diagnosis not present

## 2017-06-26 DIAGNOSIS — K259 Gastric ulcer, unspecified as acute or chronic, without hemorrhage or perforation: Secondary | ICD-10-CM | POA: Diagnosis not present

## 2017-06-26 DIAGNOSIS — I130 Hypertensive heart and chronic kidney disease with heart failure and stage 1 through stage 4 chronic kidney disease, or unspecified chronic kidney disease: Secondary | ICD-10-CM | POA: Diagnosis not present

## 2017-06-26 DIAGNOSIS — I5042 Chronic combined systolic (congestive) and diastolic (congestive) heart failure: Secondary | ICD-10-CM | POA: Diagnosis not present

## 2017-06-27 ENCOUNTER — Ambulatory Visit (HOSPITAL_COMMUNITY)
Admission: RE | Admit: 2017-06-27 | Discharge: 2017-06-27 | Disposition: A | Discharge status: Home / Self Care | Payer: Medicare HMO | Source: Ambulatory Visit | Attending: Cardiology | Admitting: Cardiology

## 2017-06-27 ENCOUNTER — Encounter (HOSPITAL_COMMUNITY): Payer: Self-pay

## 2017-06-27 VITALS — BP 132/88 | HR 71 | Wt 176.0 lb

## 2017-06-27 DIAGNOSIS — I472 Ventricular tachycardia: Secondary | ICD-10-CM | POA: Insufficient documentation

## 2017-06-27 DIAGNOSIS — R7303 Prediabetes: Secondary | ICD-10-CM | POA: Insufficient documentation

## 2017-06-27 DIAGNOSIS — I10 Essential (primary) hypertension: Secondary | ICD-10-CM | POA: Diagnosis not present

## 2017-06-27 DIAGNOSIS — Z953 Presence of xenogenic heart valve: Secondary | ICD-10-CM | POA: Insufficient documentation

## 2017-06-27 DIAGNOSIS — I25810 Atherosclerosis of coronary artery bypass graft(s) without angina pectoris: Secondary | ICD-10-CM

## 2017-06-27 DIAGNOSIS — K219 Gastro-esophageal reflux disease without esophagitis: Secondary | ICD-10-CM | POA: Insufficient documentation

## 2017-06-27 DIAGNOSIS — N183 Chronic kidney disease, stage 3 unspecified: Secondary | ICD-10-CM

## 2017-06-27 DIAGNOSIS — I255 Ischemic cardiomyopathy: Secondary | ICD-10-CM | POA: Diagnosis not present

## 2017-06-27 DIAGNOSIS — Z7901 Long term (current) use of anticoagulants: Secondary | ICD-10-CM | POA: Insufficient documentation

## 2017-06-27 DIAGNOSIS — Z95810 Presence of automatic (implantable) cardiac defibrillator: Secondary | ICD-10-CM | POA: Insufficient documentation

## 2017-06-27 DIAGNOSIS — J90 Pleural effusion, not elsewhere classified: Secondary | ICD-10-CM | POA: Diagnosis not present

## 2017-06-27 DIAGNOSIS — Z951 Presence of aortocoronary bypass graft: Secondary | ICD-10-CM | POA: Insufficient documentation

## 2017-06-27 DIAGNOSIS — I480 Paroxysmal atrial fibrillation: Secondary | ICD-10-CM | POA: Insufficient documentation

## 2017-06-27 DIAGNOSIS — I2510 Atherosclerotic heart disease of native coronary artery without angina pectoris: Secondary | ICD-10-CM | POA: Diagnosis not present

## 2017-06-27 DIAGNOSIS — I482 Chronic atrial fibrillation: Secondary | ICD-10-CM | POA: Diagnosis not present

## 2017-06-27 DIAGNOSIS — I5042 Chronic combined systolic (congestive) and diastolic (congestive) heart failure: Secondary | ICD-10-CM

## 2017-06-27 DIAGNOSIS — Z79899 Other long term (current) drug therapy: Secondary | ICD-10-CM | POA: Diagnosis not present

## 2017-06-27 DIAGNOSIS — R918 Other nonspecific abnormal finding of lung field: Secondary | ICD-10-CM | POA: Insufficient documentation

## 2017-06-27 DIAGNOSIS — E7800 Pure hypercholesterolemia, unspecified: Secondary | ICD-10-CM | POA: Diagnosis not present

## 2017-06-27 DIAGNOSIS — I130 Hypertensive heart and chronic kidney disease with heart failure and stage 1 through stage 4 chronic kidney disease, or unspecified chronic kidney disease: Secondary | ICD-10-CM | POA: Insufficient documentation

## 2017-06-27 DIAGNOSIS — I5022 Chronic systolic (congestive) heart failure: Secondary | ICD-10-CM | POA: Diagnosis present

## 2017-06-27 LAB — BASIC METABOLIC PANEL
ANION GAP: 10 (ref 5–15)
BUN: 27 mg/dL — AB (ref 6–20)
CO2: 26 mmol/L (ref 22–32)
Calcium: 8.5 mg/dL — ABNORMAL LOW (ref 8.9–10.3)
Chloride: 102 mmol/L (ref 101–111)
Creatinine, Ser: 1.69 mg/dL — ABNORMAL HIGH (ref 0.61–1.24)
GFR calc Af Amer: 42 mL/min — ABNORMAL LOW (ref >60)
GFR calc non Af Amer: 36 mL/min — ABNORMAL LOW (ref >60)
Glucose, Bld: 152 mg/dL — ABNORMAL HIGH (ref 65–99)
POTASSIUM: 5.1 mmol/L (ref 3.5–5.1)
Sodium: 138 mmol/L (ref 135–145)

## 2017-06-27 LAB — MAGNESIUM: Magnesium: 2.4 mg/dL (ref 1.7–2.4)

## 2017-06-27 LAB — CBC
HEMATOCRIT: 46.2 % (ref 39.0–52.0)
HEMOGLOBIN: 15.1 g/dL (ref 13.0–17.0)
MCH: 28.2 pg (ref 26.0–34.0)
MCHC: 32.7 g/dL (ref 30.0–36.0)
MCV: 86.2 fL (ref 78.0–100.0)
Platelets: 285 10*3/uL (ref 150–400)
RBC: 5.36 MIL/uL (ref 4.22–5.81)
RDW: 16.7 % — ABNORMAL HIGH (ref 11.5–15.5)
WBC: 11.1 10*3/uL — ABNORMAL HIGH (ref 4.0–10.5)

## 2017-06-27 NOTE — Progress Notes (Signed)
Advanced Heart Failure Clinic Note   Referring Physician: Turner Primary Care: [name], MD Primary Cardiologist: Radford Pax  HPI:  [name] is a 84 y.o. male  with history of hypertension, CAD status post CABG and pericardial tissue AVR 6433, chronic systolic HF ejection fraction 25-30% s/p CRT-D, PAF (on Eliquis)  and orthostatic hypotension referred by Dr. Radford Pax for CHF evaluation.   Saw Dr. Curt Bears 04/18/17 and device interrogation showed multiple episodes of ventricular tachycardia below his detection limit. Was taken off amiodarone due to shortness of breath (had been on for several years). Had CT scan in 11/18 which showed COPD and multiple new pulmonary nodules - unclear if related to amio or possibly malignancy. Repeat CT scheduled for 3/19  Dr. Curt Bears lowered his monitor zone and detection zone and started him on mexiletine. Patient had 30 seconds of wide complex tachycardia rate of 117 bpm 04/30/17 but had just started his mexiletine because of insurance reasons so Dr. Curt Bears wanted to continue to monitor this.  Admitted 3/9 -> 06/20/17 with recurrent pleural effusion and markedly elevated BNP. Underwent Thoracentesis x 2. Had NSVT up to 40 beats, electrolytes supped aggressively. PYP scan 06/20/17 NOT suggestive of TTR amyloid.   He presents today for regular follow up. Weight at home stable 168-169 since discharge. He feels the best he has in "some time". He can take big, deep breaths, and doesn't feel like his pleural effusion has re-accumulated. He is not very active yet, but has PT coming out to his house. He denies orthopnea, edema, or PND. No SOB around his house. He plans to walk around his 100 ft deck next week when the weather is warmer.  He denies lightheadedness or dizziness.   Review of systems complete and found to be negative unless listed in HPI.    Past Medical History:  Diagnosis Date  . AICD (automatic cardioverter/defibrillator) present   .  Arthritis    "minor everywhere" (04/22/2015)  . Asthma    "a touch"  . Barrett esophagus   . CAD (coronary artery disease)    a. s/p 2 vessel CABG with LIMA to LAD, SVG to diagonal 1, SVG to RCA 12/09.  Marland Kitchen Chronic combined systolic and diastolic CHF (congestive heart failure) (HCC)    dry weight 197-200lbs.  . CKD (chronic kidney disease), stage III ([place])   . Diabetes ([place])    "I'm prediabetic" (04/22/2015)  . Dyspnea   . GERD (gastroesophageal reflux disease)   . H/O: rheumatic fever   . Hepatitis 1957   "don't know what kind:  . History of hiatal hernia   . History of PFTs    a. Amiodarone started 5/16 >> PFTs w/ DLCO 6/16:  FEV1 72% predicted, FEV1/FVC 66%, DLCO 66% >> minimal reversible obstructive airways disease with mild diffusion defect (suggestive of emphysema but absence of hyperinflation inconsistent with dx)  . HTN (hypertension)   . Hypercholesteremia   . NICM (nonischemic cardiomyopathy) ([place])    EF 15-20% by echo 2017  . Orthostatic hypotension   . PAF (paroxysmal atrial fibrillation) ([place])   . Pneumonia 08/2014   "dr thought I may have had a touch"  . S/P AVR (aortic valve replacement) 2009   a. severe AS s/p AVR with pericardial tissue valve 2009.  Marland Kitchen Ventricular tachycardia ([place])     Current Outpatient Medications  Medication Sig Dispense Refill  . acetaminophen (TYLENOL) 500 MG tablet Take 1,000 mg by mouth every 6 (six) hours as needed for headache (pain).     Marland Kitchen  albuterol (PROVENTIL HFA;VENTOLIN HFA) 108 (90 Base) MCG/ACT inhaler Inhale 1-2 puffs into the lungs every 6 (six) hours as needed for wheezing.     . Amino Acids-Protein Hydrolys (FEEDING SUPPLEMENT, PRO-STAT SUGAR FREE 64,) LIQD Take 30 mLs by mouth daily. 900 mL 0  . atorvastatin (LIPITOR) 20 MG tablet TAKE 1 TABLET (20 MG TOTAL) DAILY AT 6 PM. 90 tablet 2  . benzonatate (TESSALON) 200 MG capsule Take 1 capsule (200 mg total) by mouth 2 (two) times daily as needed for cough. 20 capsule 0  . carvedilol  (COREG) 12.5 MG tablet Take 12.5 mg by mouth 2 (two) times daily.    Marland Kitchen ELIQUIS 2.5 MG TABS tablet TAKE 1 TABLET TWICE DAILY (Patient taking differently: TAKE 1 TABLET (2.5 MG) BY MOUTH TWICE DAILY) 180 tablet 2  . feeding supplement, ENSURE ENLIVE, (ENSURE ENLIVE) LIQD Take 237 mLs by mouth 2 (two) times daily between meals. 60 Bottle 0  . furosemide (LASIX) 40 MG tablet Take 1 tablet (40 mg total) by mouth 2 (two) times daily. 60 tablet 0  . hydrALAZINE (APRESOLINE) 25 MG tablet Take 12.5 mg by mouth 2 (two) times daily.    . isosorbide mononitrate (IMDUR) 30 MG 24 hr tablet Take 0.5 tablets (15 mg total) by mouth every evening.    . lactose free nutrition (BOOST) LIQD Take 237 mLs by mouth 3 (three) times daily as needed (meal supplement).    . methimazole (TAPAZOLE) 10 MG tablet Take 1 tablet (10 mg total) by mouth 2 (two) times daily. 180 tablet 0  . mexiletine (MEXITIL) 250 MG capsule Take 1 capsule (250 mg total) by mouth 3 (three) times daily. 90 capsule 6  . mirtazapine (REMERON) 15 MG tablet Take 1 tablet (15 mg total) by mouth at bedtime. 30 tablet 0  . Multiple Vitamin (MULTIVITAMIN WITH MINERALS) TABS tablet Take 1 tablet by mouth 2 (two) times daily.     Marland Kitchen omega-3 acid ethyl esters (LOVAZA) 1 G capsule Take 2 g by mouth 2 (two) times daily.    . ondansetron (ZOFRAN-ODT) 4 MG disintegrating tablet Take 4 mg by mouth every 8 (eight) hours as needed for nausea or vomiting.     . pantoprazole (PROTONIX) 40 MG tablet Take 1 tablet (40 mg total) by mouth daily before breakfast. 30 tablet 3  . potassium chloride SA (K-DUR,KLOR-CON) 20 MEQ tablet Take 1 tablet (20 mEq total) by mouth daily. 30 tablet 3  . predniSONE (DELTASONE) 10 MG tablet Take 2 tablets (20 mg total) by mouth daily with breakfast. 60 tablet 3  . ranitidine (ZANTAC) 300 MG tablet Take 300 mg by mouth at bedtime.    . traMADol (ULTRAM) 50 MG tablet Take 1 tablet (50 mg total) by mouth every 12 (twelve) hours as needed for  moderate pain or severe pain. 20 tablet 0   No current facility-administered medications for this encounter.    Allergies  Allergen Reactions  . Novocain [Procaine] Swelling   Social History   Socioeconomic History  . Marital status: Married    Spouse name: Not on file  . Number of children: Not on file  . Years of education: Not on file  . Highest education level: Not on file  Occupational History  . Not on file  Social Needs  . Financial resource strain: Not on file  . Food insecurity:    Worry: Not on file    Inability: Not on file  . Transportation needs:    Medical: Not  on file    Non-medical: Not on file  Tobacco Use  . Smoking status: Never Smoker  . Smokeless tobacco: Never Used  Substance and Sexual Activity  . Alcohol use: Yes    Comment: 04/22/2015 "nothing since the 1980s; never had a problem w/it"  . Drug use: No  . Sexual activity: Never  Lifestyle  . Physical activity:    Days per week: Not on file    Minutes per session: Not on file  . Stress: Not on file  Relationships  . Social connections:    Talks on phone: Not on file    Gets together: Not on file    Attends religious service: Not on file    Active member of club or organization: Not on file    Attends meetings of clubs or organizations: Not on file    Relationship status: Not on file  . Intimate partner violence:    Fear of current or ex partner: Not on file    Emotionally abused: Not on file    Physically abused: Not on file    Forced sexual activity: Not on file  Other Topics Concern  . Not on file  Social History Narrative   Married, lives with spouse [name]   4 children, 2 boys and 2 girls > one daughter passed   OCCUPATION: retired, Pension scheme manager for [employer]      Family History  Problem Relation Age of Onset  . Alzheimer's disease Mother   . COPD Daughter   . Alzheimer's disease Unknown   . COPD Sister   . Depression Sister   . Heart disease Sister   .  Hyperlipidemia Sister   . Hypertension Sister   . Esophageal cancer Sister        + smoker    Vitals:   06/27/17 1149  BP: 132/88  Pulse: 71  SpO2: 94%  Weight: 176 lb (79.8 kg)   Wt Readings from Last 3 Encounters:  06/27/17 176 lb (79.8 kg)  06/24/17 175 lb 6.4 oz (79.6 kg)  06/20/17 170 lb 3.2 oz (77.2 kg)    PHYSICAL EXAM: General: Elderly male. Ambulated into clinic with rollator without difficulty. NAD.  HEENT: Normal Neck: Supple. JVP 5-6. Carotids 2+ bilat; no bruits. No thyromegaly or nodule noted. Cor: PMI nondisplaced. RRR, No M/G/R noted Lungs: RLL dull approx 1/3 of the way up.  Abdomen: Soft, non-tender, non-distended, no HSM. No bruits or masses. +BS  Extremities: No cyanosis, clubbing, or rash. R and LLE no edema.  Neuro: Alert & orientedx3, cranial nerves grossly intact. moves all 4 extremities w/o difficulty. Affect pleasant   ASSESSMENT & PLAN:  1. Chronic systolic HF due to ICM s/p CRT-D - Echo 05/27/2017 LVEF 20-25% - NYHA II-III symptoms - Volume status stable on exam.  - Continue lasix 40 mg BID.  - Continue coreg 12.5 mg bid - Continue hydralazine 12.5 mg BID and imdur 15 mg daily. - Not on ACE/ARB/ARNI due to CKD  - Reinforced fluid restriction to < 2 L daily, sodium restriction to less than 2000 mg daily, and the importance of daily weights.    2. CAD s/p CABG/AVR 2009 - No s/s of ischemia.     3. Recent VT - seen by Dr. Curt Bears 06/24/17. Mexilitene increased to TID.   4. Lung nodules - CT with contrast 3/8/19with multiple nodules previously noted no longer seen, stable 3 mm LLL nodule noted, new 3 mm RUL nodule noted.   5. Chronic AF -  Rate controlled.  - Continue Eliquis 2.5 mg BID.   6. Recurrent R Pleural effusions  - Repeat Thoracentesis 3/11 AND 3/12 with total of 3.2 L removed.  - If recurs, will need to see about having Pleurx placed.  - Continued dull lung sounds at base, but stable from discharge.   BMET today. Close 2  week follow up. He looks great today, but is tenuous with recurrent pleural effusions. Close f/u for now.   Shirley Friar, PA-C  12:08 PM   Greater than 50% of the 25 minute visit was spent in counseling/coordination of care regarding disease state education, salt/fluid restriction, sliding scale diuretics, and medication compliance.

## 2017-06-27 NOTE — Patient Instructions (Signed)
Routine lab work today. Will notify you of abnormal results, otherwise no news is good news!  No changes to medication at this time.  Follow up 2 weeks with Oda Kilts PA-C.  ______________________________________________________________ [name] Code: 1100  Take all medication as prescribed the day of your appointment. Bring all medications with you to your appointment.  Do the following things EVERYDAY: 1) Weigh yourself in the morning before breakfast. Write it down and keep it in a log. 2) Take your medicines as prescribed 3) Eat low salt foods-Limit salt (sodium) to 2000 mg per day.  4) Stay as active as you can everyday 5) Limit all fluids for the day to less than 2 liters

## 2017-06-28 ENCOUNTER — Other Ambulatory Visit: Payer: Self-pay | Admitting: *Deleted

## 2017-06-28 DIAGNOSIS — K259 Gastric ulcer, unspecified as acute or chronic, without hemorrhage or perforation: Secondary | ICD-10-CM | POA: Diagnosis not present

## 2017-06-28 DIAGNOSIS — I130 Hypertensive heart and chronic kidney disease with heart failure and stage 1 through stage 4 chronic kidney disease, or unspecified chronic kidney disease: Secondary | ICD-10-CM | POA: Diagnosis not present

## 2017-06-28 DIAGNOSIS — I2510 Atherosclerotic heart disease of native coronary artery without angina pectoris: Secondary | ICD-10-CM | POA: Diagnosis not present

## 2017-06-28 DIAGNOSIS — E0590 Thyrotoxicosis, unspecified without thyrotoxic crisis or storm: Secondary | ICD-10-CM | POA: Diagnosis not present

## 2017-06-28 DIAGNOSIS — I429 Cardiomyopathy, unspecified: Secondary | ICD-10-CM | POA: Diagnosis not present

## 2017-06-28 DIAGNOSIS — I481 Persistent atrial fibrillation: Secondary | ICD-10-CM | POA: Diagnosis not present

## 2017-06-28 DIAGNOSIS — N183 Chronic kidney disease, stage 3 (moderate): Secondary | ICD-10-CM | POA: Diagnosis not present

## 2017-06-28 DIAGNOSIS — I5042 Chronic combined systolic (congestive) and diastolic (congestive) heart failure: Secondary | ICD-10-CM | POA: Diagnosis not present

## 2017-06-28 DIAGNOSIS — E440 Moderate protein-calorie malnutrition: Secondary | ICD-10-CM | POA: Diagnosis not present

## 2017-06-28 NOTE — Patient Outreach (Signed)
Telephone call to pt for transition of care week 2, spoke with pt, HIPAA verified, pt reports "feeling better but weak in the morning, stronger in the evening"  Continues working with home health, attending all appointments, weight today 169 pounds.  Denies edema, dyspnea " is better".  THN CM Care Plan Problem One     Most Recent Value  Care Plan Problem One  Knowledge deficit related to CHF  Role Documenting the Problem One  Care Management Coordinator  Care Plan for Problem One  Active  THN Long Term Goal   Pt will verbalize/ demonstrate better understanding self care for CHF within 60 days  THN Long Term Goal Start Date  06/06/17  Interventions for Problem One Long Term Goal  RNCM verified pt saw Dr. Curt Bears 06/24/17,  and heart failure clinic 06/27/17, no changes in medicattion per pt  THN CM Short Term Goal #1   Pt will verbalize CHF zones / action plan within 30 days  THN CM Short Term Goal #1 Start Date  06/06/17  Interventions for Short Term Goal #1  RN CM re-educated pt on CHF action plan  THN CM Short Term Goal #2   Pt will weigh daily and record within 30 days  THN CM Short Term Goal #2 Start Date  06/06/17  Interventions for Short Term Goal #2  RN CM emphasized importance of continuing daily weights    THN CM Care Plan Problem Two     Most Recent Value  Care Plan Problem Two  Pt has decreased endurance  Role Documenting the Problem Two  Care Management [place] for Problem Two  Active  THN CM Short Term Goal #1   Pt will work with home health PT and complete prescribed exercises, verbalize increase endurance within 30 days  THN CM Short Term Goal #1 Start Date  06/12/17  Interventions for Short Term Goal #2   Pt continues to work with home health PT      PLAN Continue weekly transition of care calls See pt for home visit next month  Jacqlyn Larsen [facility], [place] Coordinator [phone]

## 2017-07-01 DIAGNOSIS — N183 Chronic kidney disease, stage 3 (moderate): Secondary | ICD-10-CM | POA: Diagnosis not present

## 2017-07-01 DIAGNOSIS — I130 Hypertensive heart and chronic kidney disease with heart failure and stage 1 through stage 4 chronic kidney disease, or unspecified chronic kidney disease: Secondary | ICD-10-CM | POA: Diagnosis not present

## 2017-07-01 DIAGNOSIS — I429 Cardiomyopathy, unspecified: Secondary | ICD-10-CM | POA: Diagnosis not present

## 2017-07-01 DIAGNOSIS — I2510 Atherosclerotic heart disease of native coronary artery without angina pectoris: Secondary | ICD-10-CM | POA: Diagnosis not present

## 2017-07-01 DIAGNOSIS — I5042 Chronic combined systolic (congestive) and diastolic (congestive) heart failure: Secondary | ICD-10-CM | POA: Diagnosis not present

## 2017-07-01 DIAGNOSIS — K259 Gastric ulcer, unspecified as acute or chronic, without hemorrhage or perforation: Secondary | ICD-10-CM | POA: Diagnosis not present

## 2017-07-01 DIAGNOSIS — E440 Moderate protein-calorie malnutrition: Secondary | ICD-10-CM | POA: Diagnosis not present

## 2017-07-01 DIAGNOSIS — I481 Persistent atrial fibrillation: Secondary | ICD-10-CM | POA: Diagnosis not present

## 2017-07-01 DIAGNOSIS — E0590 Thyrotoxicosis, unspecified without thyrotoxic crisis or storm: Secondary | ICD-10-CM | POA: Diagnosis not present

## 2017-07-02 DIAGNOSIS — I2510 Atherosclerotic heart disease of native coronary artery without angina pectoris: Secondary | ICD-10-CM | POA: Diagnosis not present

## 2017-07-02 DIAGNOSIS — I130 Hypertensive heart and chronic kidney disease with heart failure and stage 1 through stage 4 chronic kidney disease, or unspecified chronic kidney disease: Secondary | ICD-10-CM | POA: Diagnosis not present

## 2017-07-02 DIAGNOSIS — N183 Chronic kidney disease, stage 3 (moderate): Secondary | ICD-10-CM | POA: Diagnosis not present

## 2017-07-02 DIAGNOSIS — I5042 Chronic combined systolic (congestive) and diastolic (congestive) heart failure: Secondary | ICD-10-CM | POA: Diagnosis not present

## 2017-07-02 DIAGNOSIS — E440 Moderate protein-calorie malnutrition: Secondary | ICD-10-CM | POA: Diagnosis not present

## 2017-07-02 DIAGNOSIS — E0590 Thyrotoxicosis, unspecified without thyrotoxic crisis or storm: Secondary | ICD-10-CM | POA: Diagnosis not present

## 2017-07-02 DIAGNOSIS — I481 Persistent atrial fibrillation: Secondary | ICD-10-CM | POA: Diagnosis not present

## 2017-07-02 DIAGNOSIS — I429 Cardiomyopathy, unspecified: Secondary | ICD-10-CM | POA: Diagnosis not present

## 2017-07-02 DIAGNOSIS — K259 Gastric ulcer, unspecified as acute or chronic, without hemorrhage or perforation: Secondary | ICD-10-CM | POA: Diagnosis not present

## 2017-07-03 DIAGNOSIS — I481 Persistent atrial fibrillation: Secondary | ICD-10-CM | POA: Diagnosis not present

## 2017-07-03 DIAGNOSIS — K259 Gastric ulcer, unspecified as acute or chronic, without hemorrhage or perforation: Secondary | ICD-10-CM | POA: Diagnosis not present

## 2017-07-03 DIAGNOSIS — I130 Hypertensive heart and chronic kidney disease with heart failure and stage 1 through stage 4 chronic kidney disease, or unspecified chronic kidney disease: Secondary | ICD-10-CM | POA: Diagnosis not present

## 2017-07-03 DIAGNOSIS — E0590 Thyrotoxicosis, unspecified without thyrotoxic crisis or storm: Secondary | ICD-10-CM | POA: Diagnosis not present

## 2017-07-03 DIAGNOSIS — I5042 Chronic combined systolic (congestive) and diastolic (congestive) heart failure: Secondary | ICD-10-CM | POA: Diagnosis not present

## 2017-07-03 DIAGNOSIS — E440 Moderate protein-calorie malnutrition: Secondary | ICD-10-CM | POA: Diagnosis not present

## 2017-07-03 DIAGNOSIS — N183 Chronic kidney disease, stage 3 (moderate): Secondary | ICD-10-CM | POA: Diagnosis not present

## 2017-07-03 DIAGNOSIS — I429 Cardiomyopathy, unspecified: Secondary | ICD-10-CM | POA: Diagnosis not present

## 2017-07-03 DIAGNOSIS — I2510 Atherosclerotic heart disease of native coronary artery without angina pectoris: Secondary | ICD-10-CM | POA: Diagnosis not present

## 2017-07-04 ENCOUNTER — Ambulatory Visit: Payer: Medicare HMO | Admitting: Cardiology

## 2017-07-04 ENCOUNTER — Other Ambulatory Visit: Payer: Self-pay | Admitting: *Deleted

## 2017-07-04 DIAGNOSIS — I481 Persistent atrial fibrillation: Secondary | ICD-10-CM | POA: Diagnosis not present

## 2017-07-04 DIAGNOSIS — N183 Chronic kidney disease, stage 3 (moderate): Secondary | ICD-10-CM | POA: Diagnosis not present

## 2017-07-04 DIAGNOSIS — I2510 Atherosclerotic heart disease of native coronary artery without angina pectoris: Secondary | ICD-10-CM | POA: Diagnosis not present

## 2017-07-04 DIAGNOSIS — E0590 Thyrotoxicosis, unspecified without thyrotoxic crisis or storm: Secondary | ICD-10-CM | POA: Diagnosis not present

## 2017-07-04 DIAGNOSIS — I429 Cardiomyopathy, unspecified: Secondary | ICD-10-CM | POA: Diagnosis not present

## 2017-07-04 DIAGNOSIS — I130 Hypertensive heart and chronic kidney disease with heart failure and stage 1 through stage 4 chronic kidney disease, or unspecified chronic kidney disease: Secondary | ICD-10-CM | POA: Diagnosis not present

## 2017-07-04 DIAGNOSIS — K259 Gastric ulcer, unspecified as acute or chronic, without hemorrhage or perforation: Secondary | ICD-10-CM | POA: Diagnosis not present

## 2017-07-04 DIAGNOSIS — I5042 Chronic combined systolic (congestive) and diastolic (congestive) heart failure: Secondary | ICD-10-CM | POA: Diagnosis not present

## 2017-07-04 DIAGNOSIS — E440 Moderate protein-calorie malnutrition: Secondary | ICD-10-CM | POA: Diagnosis not present

## 2017-07-04 NOTE — Patient Outreach (Signed)
Telephone call to pt for transition of care week 3, spoke with pt, HIPAA verified, pt reports " I'm feeling better, I haven't been short of breath"  Pt reports he has all medications and taking as prescribed, weight today 171 pounds, attending all appointments.  No new concerns voiced.  THN CM Care Plan Problem One     Most Recent Value  Care Plan Problem One  Knowledge deficit related to CHF  Role Documenting the Problem One  Care Management [place] for Problem One  Active  THN Long Term Goal   Pt will verbalize/ demonstrate better understanding self care for CHF within 60 days  THN Long Term Goal Start Date  06/06/17  Interventions for Problem One Long Term Goal  RN CM verified that home health RN and PT continues working with pt, RN CM encouraged pt to complete prescribed exercises,  pt denies dypsnea or edema  THN CM Short Term Goal #1   Pt will verbalize CHF zones / action plan within 30 days  THN CM Short Term Goal #1 Start Date  06/06/17  Interventions for Short Term Goal #1  RN CM re-educated CHF action plan/ zones  THN CM Short Term Goal #2   Pt will weigh daily and record within 30 days  THN CM Short Term Goal #2 Start Date  06/06/17  Interventions for Short Term Goal #2  RN CM emphasized importance of daily weights, praised and encouraged pt for his efforts    [facility] CM Care Plan Problem Two     Most Recent Value  Care Plan Problem Two  Pt has decreased endurance  Role Documenting the Problem Two  Care Management [place] for Problem Two  Active  THN CM Short Term Goal #1   Pt will work with home health PT and complete prescribed exercises, verbalize increase endurance within 30 days  THN CM Short Term Goal #1 Start Date  06/12/17  Interventions for Short Term Goal #2   Pt reports he does not feel as weak and feels home health PT is helping      PLAN See pt for home visit next week  Jacqlyn Larsen [facility], [place]  Coordinator [phone]

## 2017-07-05 ENCOUNTER — Telehealth (HOSPITAL_COMMUNITY): Payer: Self-pay | Admitting: *Deleted

## 2017-07-05 ENCOUNTER — Telehealth: Payer: Self-pay

## 2017-07-05 DIAGNOSIS — I481 Persistent atrial fibrillation: Secondary | ICD-10-CM | POA: Diagnosis not present

## 2017-07-05 DIAGNOSIS — I5042 Chronic combined systolic (congestive) and diastolic (congestive) heart failure: Secondary | ICD-10-CM | POA: Diagnosis not present

## 2017-07-05 DIAGNOSIS — I429 Cardiomyopathy, unspecified: Secondary | ICD-10-CM | POA: Diagnosis not present

## 2017-07-05 DIAGNOSIS — I2510 Atherosclerotic heart disease of native coronary artery without angina pectoris: Secondary | ICD-10-CM | POA: Diagnosis not present

## 2017-07-05 DIAGNOSIS — K259 Gastric ulcer, unspecified as acute or chronic, without hemorrhage or perforation: Secondary | ICD-10-CM | POA: Diagnosis not present

## 2017-07-05 DIAGNOSIS — E440 Moderate protein-calorie malnutrition: Secondary | ICD-10-CM | POA: Diagnosis not present

## 2017-07-05 DIAGNOSIS — E0590 Thyrotoxicosis, unspecified without thyrotoxic crisis or storm: Secondary | ICD-10-CM | POA: Diagnosis not present

## 2017-07-05 DIAGNOSIS — N183 Chronic kidney disease, stage 3 (moderate): Secondary | ICD-10-CM | POA: Diagnosis not present

## 2017-07-05 DIAGNOSIS — I130 Hypertensive heart and chronic kidney disease with heart failure and stage 1 through stage 4 chronic kidney disease, or unspecified chronic kidney disease: Secondary | ICD-10-CM | POA: Diagnosis not present

## 2017-07-05 NOTE — Telephone Encounter (Signed)
Advanced Heart Failure Triage Encounter  Patient Name: [name]  Date of Call: 07/05/17  Problem:  Home health RN called to report that patient has started having tremors over the past week.  Patient believes its related to his recent increased dose of mexitil.  RN wanted [place] to be aware so that we can follow up with patient at his next appointment.    Plan:  Will forward [name], PA who will be seeing patient in clinic.   Darron Doom, RN

## 2017-07-05 NOTE — Telephone Encounter (Signed)
Patient referred to [facility] clinic by Chanetta Marshall, NP following recent hospitalization.  Attempted ICM intro call to patient and person answering phone stated he was not available and to call back.

## 2017-07-08 DIAGNOSIS — E440 Moderate protein-calorie malnutrition: Secondary | ICD-10-CM | POA: Diagnosis not present

## 2017-07-08 DIAGNOSIS — I130 Hypertensive heart and chronic kidney disease with heart failure and stage 1 through stage 4 chronic kidney disease, or unspecified chronic kidney disease: Secondary | ICD-10-CM | POA: Diagnosis not present

## 2017-07-08 DIAGNOSIS — K259 Gastric ulcer, unspecified as acute or chronic, without hemorrhage or perforation: Secondary | ICD-10-CM | POA: Diagnosis not present

## 2017-07-08 DIAGNOSIS — I481 Persistent atrial fibrillation: Secondary | ICD-10-CM | POA: Diagnosis not present

## 2017-07-08 DIAGNOSIS — N183 Chronic kidney disease, stage 3 (moderate): Secondary | ICD-10-CM | POA: Diagnosis not present

## 2017-07-08 DIAGNOSIS — I5042 Chronic combined systolic (congestive) and diastolic (congestive) heart failure: Secondary | ICD-10-CM | POA: Diagnosis not present

## 2017-07-08 DIAGNOSIS — I2510 Atherosclerotic heart disease of native coronary artery without angina pectoris: Secondary | ICD-10-CM | POA: Diagnosis not present

## 2017-07-08 DIAGNOSIS — I429 Cardiomyopathy, unspecified: Secondary | ICD-10-CM | POA: Diagnosis not present

## 2017-07-08 DIAGNOSIS — E0590 Thyrotoxicosis, unspecified without thyrotoxic crisis or storm: Secondary | ICD-10-CM | POA: Diagnosis not present

## 2017-07-09 DIAGNOSIS — I2510 Atherosclerotic heart disease of native coronary artery without angina pectoris: Secondary | ICD-10-CM | POA: Diagnosis not present

## 2017-07-09 DIAGNOSIS — E440 Moderate protein-calorie malnutrition: Secondary | ICD-10-CM | POA: Diagnosis not present

## 2017-07-09 DIAGNOSIS — I429 Cardiomyopathy, unspecified: Secondary | ICD-10-CM | POA: Diagnosis not present

## 2017-07-09 DIAGNOSIS — E0590 Thyrotoxicosis, unspecified without thyrotoxic crisis or storm: Secondary | ICD-10-CM | POA: Diagnosis not present

## 2017-07-09 DIAGNOSIS — N183 Chronic kidney disease, stage 3 (moderate): Secondary | ICD-10-CM | POA: Diagnosis not present

## 2017-07-09 DIAGNOSIS — I130 Hypertensive heart and chronic kidney disease with heart failure and stage 1 through stage 4 chronic kidney disease, or unspecified chronic kidney disease: Secondary | ICD-10-CM | POA: Diagnosis not present

## 2017-07-09 DIAGNOSIS — I5042 Chronic combined systolic (congestive) and diastolic (congestive) heart failure: Secondary | ICD-10-CM | POA: Diagnosis not present

## 2017-07-09 DIAGNOSIS — K259 Gastric ulcer, unspecified as acute or chronic, without hemorrhage or perforation: Secondary | ICD-10-CM | POA: Diagnosis not present

## 2017-07-09 DIAGNOSIS — I481 Persistent atrial fibrillation: Secondary | ICD-10-CM | POA: Diagnosis not present

## 2017-07-09 NOTE — Telephone Encounter (Signed)
Will review in clinic.    Legrand Como [address] [place], PA-C 07/09/2017 1:05 PM

## 2017-07-10 ENCOUNTER — Other Ambulatory Visit: Payer: Self-pay | Admitting: *Deleted

## 2017-07-10 ENCOUNTER — Encounter: Payer: Self-pay | Admitting: *Deleted

## 2017-07-10 DIAGNOSIS — E0590 Thyrotoxicosis, unspecified without thyrotoxic crisis or storm: Secondary | ICD-10-CM | POA: Diagnosis not present

## 2017-07-10 DIAGNOSIS — I5042 Chronic combined systolic (congestive) and diastolic (congestive) heart failure: Secondary | ICD-10-CM | POA: Diagnosis not present

## 2017-07-10 DIAGNOSIS — I429 Cardiomyopathy, unspecified: Secondary | ICD-10-CM | POA: Diagnosis not present

## 2017-07-10 DIAGNOSIS — I481 Persistent atrial fibrillation: Secondary | ICD-10-CM | POA: Diagnosis not present

## 2017-07-10 DIAGNOSIS — N183 Chronic kidney disease, stage 3 (moderate): Secondary | ICD-10-CM | POA: Diagnosis not present

## 2017-07-10 DIAGNOSIS — K259 Gastric ulcer, unspecified as acute or chronic, without hemorrhage or perforation: Secondary | ICD-10-CM | POA: Diagnosis not present

## 2017-07-10 DIAGNOSIS — I130 Hypertensive heart and chronic kidney disease with heart failure and stage 1 through stage 4 chronic kidney disease, or unspecified chronic kidney disease: Secondary | ICD-10-CM | POA: Diagnosis not present

## 2017-07-10 DIAGNOSIS — I2510 Atherosclerotic heart disease of native coronary artery without angina pectoris: Secondary | ICD-10-CM | POA: Diagnosis not present

## 2017-07-10 DIAGNOSIS — E440 Moderate protein-calorie malnutrition: Secondary | ICD-10-CM | POA: Diagnosis not present

## 2017-07-10 NOTE — Patient Outreach (Signed)
[facility]) Care Management   07/10/2017  [name] 1932-07-17 [id number]  [name] is an 84 y.o. male  Subjective: Routine home visit with pt, HIPAA verified, pt reports he is attending all scheduled appointments, exercising daily with prescribed exercises, home health discharged yesterday, to heart failure clinic tomorrow. Pt denies dyspnea with rest and states " since Sunday, I've noticed I do have a little more shortness of breath when I'm walking and doing my exercises, just don't want another pleural effusion"  States " they couldn't get all the fluid out of my lung because of the pain it caused me"  Objective:   Vitals:   07/10/17 1009  BP: 130/64  Pulse: 78  Resp: 18  SpO2: 94%  Weight: 173 lb (78.5 kg)   ROS  Physical Exam  Constitutional: He is oriented to person, place, and time. He appears well-developed and well-nourished.  HENT:  Head: Normocephalic.  Neck: Normal range of motion. Neck supple.  Cardiovascular: Normal rate.  Irregular rhythm  Respiratory: Effort normal.  Dyspnea with exertion Right lower lobe posterior slightly diminished  GI: Bowel sounds are normal.  Musculoskeletal: Normal range of motion. He exhibits no edema.  Neurological: He is alert and oriented to person, place, and time.  Skin: Skin is dry.  Hands cold to touch  Psychiatric: He has a normal mood and affect. His behavior is normal. Judgment and thought content normal.    Encounter Medications:   Outpatient Encounter Medications as of 07/10/2017  Medication Sig Note  . acetaminophen (TYLENOL) 500 MG tablet Take 1,000 mg by mouth every 6 (six) hours as needed for headache (pain).    Marland Kitchen albuterol (PROVENTIL HFA;VENTOLIN HFA) 108 (90 Base) MCG/ACT inhaler Inhale 1-2 puffs into the lungs every 6 (six) hours as needed for wheezing.    . Amino Acids-Protein Hydrolys (FEEDING SUPPLEMENT, PRO-STAT SUGAR FREE 64,) LIQD Take 30 mLs by mouth daily.   Marland Kitchen atorvastatin  (LIPITOR) 20 MG tablet TAKE 1 TABLET (20 MG TOTAL) DAILY AT 6 PM.   . benzonatate (TESSALON) 200 MG capsule Take 1 capsule (200 mg total) by mouth 2 (two) times daily as needed for cough.   . carvedilol (COREG) 12.5 MG tablet Take 12.5 mg by mouth 2 (two) times daily.   Marland Kitchen ELIQUIS 2.5 MG TABS tablet TAKE 1 TABLET TWICE DAILY (Patient taking differently: TAKE 1 TABLET (2.5 MG) BY MOUTH TWICE DAILY)   . furosemide (LASIX) 40 MG tablet Take 1 tablet (40 mg total) by mouth 2 (two) times daily.   . hydrALAZINE (APRESOLINE) 25 MG tablet Take 12.5 mg by mouth 2 (two) times daily.   . isosorbide mononitrate (IMDUR) 30 MG 24 hr tablet Take 0.5 tablets (15 mg total) by mouth every evening.   . lactose free nutrition (BOOST) LIQD Take 237 mLs by mouth 3 (three) times daily as needed (meal supplement).   . mexiletine (MEXITIL) 250 MG capsule Take 1 capsule (250 mg total) by mouth 3 (three) times daily.   . mirtazapine (REMERON) 15 MG tablet Take 1 tablet (15 mg total) by mouth at bedtime.   . Multiple Vitamin (MULTIVITAMIN WITH MINERALS) TABS tablet Take 1 tablet by mouth 2 (two) times daily.    Marland Kitchen omega-3 acid ethyl esters (LOVAZA) 1 G capsule Take 2 g by mouth 2 (two) times daily.   . ondansetron (ZOFRAN-ODT) 4 MG disintegrating tablet Take 4 mg by mouth every 8 (eight) hours as needed for nausea or vomiting.    Marland Kitchen  pantoprazole (PROTONIX) 40 MG tablet Take 1 tablet (40 mg total) by mouth daily before breakfast.   . potassium chloride SA (K-DUR,KLOR-CON) 20 MEQ tablet Take 1 tablet (20 mEq total) by mouth daily.   . predniSONE (DELTASONE) 10 MG tablet Take 2 tablets (20 mg total) by mouth daily with breakfast. 06/15/2017: #60 filled 05/28/17 / 3 refills  . traMADol (ULTRAM) 50 MG tablet Take 1 tablet (50 mg total) by mouth every 12 (twelve) hours as needed for moderate pain or severe pain.   . feeding supplement, ENSURE ENLIVE, (ENSURE ENLIVE) LIQD Take 237 mLs by mouth 2 (two) times daily between meals. (Patient  not taking: Reported on 07/10/2017) 06/15/2017: See entry for Boost  . methimazole (TAPAZOLE) 10 MG tablet Take 1 tablet (10 mg total) by mouth 2 (two) times daily.   . ranitidine (ZANTAC) 300 MG tablet Take 300 mg by mouth at bedtime.    No facility-administered encounter medications on file as of 07/10/2017.     Functional Status:   In your present state of health, do you have any difficulty performing the following activities: 06/16/2017 06/12/2017  Hearing? N Y  Comment - both ears " a little" per pt,  pt has no hearing aides and not interested  Vision? N N  Difficulty concentrating or making decisions? N Y  Comment - at times  Walking or climbing stairs? Y Y  Dressing or bathing? N Y  Doing errands, shopping? Y Y  Conservation officer, nature and eating ? - N  Using the Toilet? - N  In the past six months, have you accidently leaked urine? - N  Do you have problems with loss of bowel control? - N  Managing your Medications? - N  Managing your Finances? - N  Housekeeping or managing your Housekeeping? - Y  Some recent data might be hidden    Fall/Depression Screening:    Fall Risk  07/10/2017 06/12/2017 05/09/2015  Falls in the past year? No No No  Risk for fall due to : Medication side effect;Impaired balance/gait Medication side effect;Impaired balance/gait -   PHQ 2/9 Scores 06/12/2017 12/27/2015 05/09/2015  PHQ - 2 Score 1 0 0    Assessment: RN CM reviewed medications with pt, pt has improved appetite and gained a few pounds, RN CM called Heart Failure clinic, left voicemail for triage nurse and reported diminished breath sounds right lower lobe and pt noticing increase in dyspnea with exertion since Sunday.  RN CM placed emphasis on action plan and importance of calling MD early on for change in health status/ symptoms.  THN CM Care Plan Problem One     Most Recent Value  Care Plan Problem One  Knowledge deficit related to CHF  Role Documenting the Problem One  Care Management Coordinator  Care  Plan for Problem One  Active  THN Long Term Goal   Pt will verbalize/ demonstrate better understanding self care for CHF within 60 days  THN Long Term Goal Start Date  06/06/17  Interventions for Problem One Long Term Goal  RN CM reviewed importance of completing prescribed daily exercises, called HF clinic, left voicemail with triage RN reported pt states he has had increased dyspnea with exertion since Sunday and diminished right lower lobe posterior (pt has had multiple pleural effusions), pt to see MD at HF clinic tomorrow  [facility] CM Short Term Goal #1   Pt will verbalize CHF zones / action plan within 30 days  THN CM Short Term Goal #1 Start  Date  07/10/17 Barrie Folk re-established]  Interventions for Short Term Goal #1  RN CM re educated on CHF action plan with emphasis on yellow zone  THN CM Short Term Goal #2   Pt will weigh daily and record within 30 days  THN CM Short Term Goal #2 Start Date  06/06/17  [facility] CM Short Term Goal #2 Met Date  07/10/17  Interventions for Short Term Goal #2  RN CM praised and encouraged pt for continuing to weigh daily and record    [facility] CM Care Plan Problem Two     Most Recent Value  Care Plan Problem Two  Pt has decreased endurance  Role Documenting the Problem Two  Care Management [place] for Problem Two  Active  THN CM Short Term Goal #1   Pt will work with home health PT and complete prescribed exercises, verbalize increase endurance within 30 days  THN CM Short Term Goal #1 Start Date  07/10/17 [goal restarted]  Interventions for Short Term Goal #2   Ongoing goal for pt, pt states he intends to try and continue exercise daily to increase endurance, RN CM reviewed energy conservation, alternating activity with rest      Plan: call pt beginning of next week for follow up after HF clinic appointment see pt for home visit next month  Jacqlyn Larsen [facility], [place] [phone]  f

## 2017-07-11 ENCOUNTER — Inpatient Hospital Stay: Admission: AD | Admit: 2017-07-11 | Payer: Medicare HMO | Source: Ambulatory Visit | Admitting: Internal Medicine

## 2017-07-11 ENCOUNTER — Encounter (HOSPITAL_COMMUNITY): Payer: Self-pay

## 2017-07-11 ENCOUNTER — Ambulatory Visit (HOSPITAL_COMMUNITY)
Admission: RE | Admit: 2017-07-11 | Discharge: 2017-07-11 | Disposition: A | Discharge status: Home / Self Care | Payer: Medicare HMO | Source: Ambulatory Visit | Attending: Student | Admitting: Student

## 2017-07-11 ENCOUNTER — Ambulatory Visit (HOSPITAL_COMMUNITY)
Admission: RE | Admit: 2017-07-11 | Discharge: 2017-07-11 | Disposition: A | Discharge status: Home / Self Care | Payer: Medicare HMO | Source: Ambulatory Visit | Attending: Internal Medicine | Admitting: Internal Medicine

## 2017-07-11 ENCOUNTER — Other Ambulatory Visit (HOSPITAL_COMMUNITY): Payer: Self-pay | Admitting: Student

## 2017-07-11 VITALS — BP 106/62 | HR 87 | Wt 177.0 lb

## 2017-07-11 DIAGNOSIS — R918 Other nonspecific abnormal finding of lung field: Secondary | ICD-10-CM | POA: Insufficient documentation

## 2017-07-11 DIAGNOSIS — Z951 Presence of aortocoronary bypass graft: Secondary | ICD-10-CM | POA: Insufficient documentation

## 2017-07-11 DIAGNOSIS — I428 Other cardiomyopathies: Secondary | ICD-10-CM | POA: Insufficient documentation

## 2017-07-11 DIAGNOSIS — I10 Essential (primary) hypertension: Secondary | ICD-10-CM

## 2017-07-11 DIAGNOSIS — I2510 Atherosclerotic heart disease of native coronary artery without angina pectoris: Secondary | ICD-10-CM | POA: Diagnosis not present

## 2017-07-11 DIAGNOSIS — Z7901 Long term (current) use of anticoagulants: Secondary | ICD-10-CM | POA: Insufficient documentation

## 2017-07-11 DIAGNOSIS — J90 Pleural effusion, not elsewhere classified: Secondary | ICD-10-CM

## 2017-07-11 DIAGNOSIS — K219 Gastro-esophageal reflux disease without esophagitis: Secondary | ICD-10-CM | POA: Diagnosis not present

## 2017-07-11 DIAGNOSIS — Z95810 Presence of automatic (implantable) cardiac defibrillator: Secondary | ICD-10-CM | POA: Diagnosis not present

## 2017-07-11 DIAGNOSIS — I5022 Chronic systolic (congestive) heart failure: Secondary | ICD-10-CM | POA: Diagnosis not present

## 2017-07-11 DIAGNOSIS — N183 Chronic kidney disease, stage 3 unspecified: Secondary | ICD-10-CM

## 2017-07-11 DIAGNOSIS — I480 Paroxysmal atrial fibrillation: Secondary | ICD-10-CM | POA: Insufficient documentation

## 2017-07-11 DIAGNOSIS — E1122 Type 2 diabetes mellitus with diabetic chronic kidney disease: Secondary | ICD-10-CM | POA: Insufficient documentation

## 2017-07-11 DIAGNOSIS — I130 Hypertensive heart and chronic kidney disease with heart failure and stage 1 through stage 4 chronic kidney disease, or unspecified chronic kidney disease: Secondary | ICD-10-CM | POA: Diagnosis not present

## 2017-07-11 DIAGNOSIS — I482 Chronic atrial fibrillation: Secondary | ICD-10-CM | POA: Insufficient documentation

## 2017-07-11 DIAGNOSIS — I25810 Atherosclerosis of coronary artery bypass graft(s) without angina pectoris: Secondary | ICD-10-CM

## 2017-07-11 DIAGNOSIS — Z79899 Other long term (current) drug therapy: Secondary | ICD-10-CM | POA: Insufficient documentation

## 2017-07-11 DIAGNOSIS — I5042 Chronic combined systolic (congestive) and diastolic (congestive) heart failure: Secondary | ICD-10-CM | POA: Diagnosis not present

## 2017-07-11 HISTORY — PX: IR THORACENTESIS ASP PLEURAL SPACE W/IMG GUIDE: IMG5380

## 2017-07-11 LAB — CBC
HEMATOCRIT: 46.1 % (ref 39.0–52.0)
Hemoglobin: 14.9 g/dL (ref 13.0–17.0)
MCH: 27.6 pg (ref 26.0–34.0)
MCHC: 32.3 g/dL (ref 30.0–36.0)
MCV: 85.4 fL (ref 78.0–100.0)
Platelets: 291 10*3/uL (ref 150–400)
RBC: 5.4 MIL/uL (ref 4.22–5.81)
RDW: 18.7 % — AB (ref 11.5–15.5)
WBC: 13.3 10*3/uL — AB (ref 4.0–10.5)

## 2017-07-11 LAB — BRAIN NATRIURETIC PEPTIDE: B NATRIURETIC PEPTIDE 5: 683.7 pg/mL — AB (ref 0.0–100.0)

## 2017-07-11 MED ORDER — LIDOCAINE HCL (PF) 2 % IJ SOLN
INTRAMUSCULAR | Status: DC | PRN
  Administered 2017-07-11: 10 mL

## 2017-07-11 MED ORDER — LIDOCAINE HCL (PF) 2 % IJ SOLN
INTRAMUSCULAR | Status: AC
  Filled 2017-07-11: qty 20

## 2017-07-11 NOTE — Procedures (Signed)
PROCEDURE SUMMARY:  Successful US guided right thoracentesis. Yielded 2 liters of clear amber fluid. Patient tolerated procedure well. No immediate complications.  Post procedure chest X-ray reveals no pneumothorax  WENDY S BLAIR PA-C 07/11/2017 4:48 PM

## 2017-07-11 NOTE — Progress Notes (Signed)
Advanced Heart Failure Clinic Note   Referring Physician: Turner Primary Care: [name], MD Primary Cardiologist: Radford Pax  HPI:  [name] is a 84 y.o. male  with history of hypertension, CAD status post CABG and pericardial tissue AVR 7673, chronic systolic HF ejection fraction 25-30% s/p CRT-D, PAF (on Eliquis)  and orthostatic hypotension referred by Dr. Radford Pax for CHF evaluation.   Saw Dr. Curt Bears 04/18/17 and device interrogation showed multiple episodes of ventricular tachycardia below his detection limit. Was taken off amiodarone due to shortness of breath (had been on for several years). Had CT scan in 11/18 which showed COPD and multiple new pulmonary nodules - unclear if related to amio or possibly malignancy. Repeat CT scheduled for 3/19  Dr. Curt Bears lowered his monitor zone and detection zone and started him on mexiletine. Patient had 30 seconds of wide complex tachycardia rate of 117 bpm 04/30/17 but had just started his mexiletine because of insurance reasons so Dr. Curt Bears wanted to continue to monitor this.  Admitted 3/9 -> 06/20/17 with recurrent pleural effusion and markedly elevated BNP. Underwent Thoracentesis x 2. Had NSVT up to 40 beats, electrolytes supped aggressively. PYP scan 06/20/17 NOT suggestive of TTR amyloid.   He presents today for follow up. At last visit was very stable. Could take big, deep breathings and felt the best he had in years. Since yesterday, he has become more and more SOB, and feels as if his pleural effusion has re-accumulated. He is lightheaded with rapid standing. He is orthopneic and SOB at rest, worse with any exertion. He has been taking all medication as directed. His weight is up 4 lbs overnight. He had a friend bring him, but does not feel as if he can go home. He doesn't feel safe with the severity of his SOB. Denies CP or palpitations. No LE edema.   Review of systems complete and found to be negative unless listed in HPI.     Past Medical History:  Diagnosis Date  . AICD (automatic cardioverter/defibrillator) present   . Arthritis    "minor everywhere" (04/22/2015)  . Asthma    "a touch"  . Barrett esophagus   . CAD (coronary artery disease)    a. s/p 2 vessel CABG with LIMA to LAD, SVG to diagonal 1, SVG to RCA 12/09.  Marland Kitchen Chronic combined systolic and diastolic CHF (congestive heart failure) (HCC)    dry weight 197-200lbs.  . CKD (chronic kidney disease), stage III ([place])   . Diabetes ([place])    "I'm prediabetic" (04/22/2015)  . Dyspnea   . GERD (gastroesophageal reflux disease)   . H/O: rheumatic fever   . Hepatitis 1957   "don't know what kind:  . History of hiatal hernia   . History of PFTs    a. Amiodarone started 5/16 >> PFTs w/ DLCO 6/16:  FEV1 72% predicted, FEV1/FVC 66%, DLCO 66% >> minimal reversible obstructive airways disease with mild diffusion defect (suggestive of emphysema but absence of hyperinflation inconsistent with dx)  . HTN (hypertension)   . Hypercholesteremia   . NICM (nonischemic cardiomyopathy) ([place])    EF 15-20% by echo 2017  . Orthostatic hypotension   . PAF (paroxysmal atrial fibrillation) ([place])   . Pneumonia 08/2014   "dr thought I may have had a touch"  . S/P AVR (aortic valve replacement) 2009   a. severe AS s/p AVR with pericardial tissue valve 2009.  Marland Kitchen Ventricular tachycardia ([place])     Current Outpatient Medications  Medication Sig Dispense  Refill  . acetaminophen (TYLENOL) 500 MG tablet Take 1,000 mg by mouth every 6 (six) hours as needed for headache (pain).     Marland Kitchen albuterol (PROVENTIL HFA;VENTOLIN HFA) 108 (90 Base) MCG/ACT inhaler Inhale 1-2 puffs into the lungs every 6 (six) hours as needed for wheezing.     . Amino Acids-Protein Hydrolys (FEEDING SUPPLEMENT, PRO-STAT SUGAR FREE 64,) LIQD Take 30 mLs by mouth daily. 900 mL 0  . atorvastatin (LIPITOR) 20 MG tablet TAKE 1 TABLET (20 MG TOTAL) DAILY AT 6 PM. 90 tablet 2  . benzonatate (TESSALON) 200 MG capsule  Take 1 capsule (200 mg total) by mouth 2 (two) times daily as needed for cough. 20 capsule 0  . carvedilol (COREG) 12.5 MG tablet Take 12.5 mg by mouth 2 (two) times daily.    Marland Kitchen ELIQUIS 2.5 MG TABS tablet TAKE 1 TABLET TWICE DAILY (Patient taking differently: TAKE 1 TABLET (2.5 MG) BY MOUTH TWICE DAILY) 180 tablet 2  . feeding supplement, ENSURE ENLIVE, (ENSURE ENLIVE) LIQD Take 237 mLs by mouth 2 (two) times daily between meals. (Patient not taking: Reported on 07/10/2017) 60 Bottle 0  . furosemide (LASIX) 40 MG tablet Take 1 tablet (40 mg total) by mouth 2 (two) times daily. 60 tablet 0  . hydrALAZINE (APRESOLINE) 25 MG tablet Take 12.5 mg by mouth 2 (two) times daily.    . isosorbide mononitrate (IMDUR) 30 MG 24 hr tablet Take 0.5 tablets (15 mg total) by mouth every evening.    . lactose free nutrition (BOOST) LIQD Take 237 mLs by mouth 3 (three) times daily as needed (meal supplement).    . methimazole (TAPAZOLE) 10 MG tablet Take 1 tablet (10 mg total) by mouth 2 (two) times daily. 180 tablet 0  . mexiletine (MEXITIL) 250 MG capsule Take 1 capsule (250 mg total) by mouth 3 (three) times daily. 90 capsule 6  . mirtazapine (REMERON) 15 MG tablet Take 1 tablet (15 mg total) by mouth at bedtime. 30 tablet 0  . Multiple Vitamin (MULTIVITAMIN WITH MINERALS) TABS tablet Take 1 tablet by mouth 2 (two) times daily.     Marland Kitchen omega-3 acid ethyl esters (LOVAZA) 1 G capsule Take 2 g by mouth 2 (two) times daily.    . ondansetron (ZOFRAN-ODT) 4 MG disintegrating tablet Take 4 mg by mouth every 8 (eight) hours as needed for nausea or vomiting.     . pantoprazole (PROTONIX) 40 MG tablet Take 1 tablet (40 mg total) by mouth daily before breakfast. 30 tablet 3  . potassium chloride SA (K-DUR,KLOR-CON) 20 MEQ tablet Take 1 tablet (20 mEq total) by mouth daily. 30 tablet 3  . predniSONE (DELTASONE) 10 MG tablet Take 2 tablets (20 mg total) by mouth daily with breakfast. 60 tablet 3  . ranitidine (ZANTAC) 300 MG tablet  Take 300 mg by mouth at bedtime.    . traMADol (ULTRAM) 50 MG tablet Take 1 tablet (50 mg total) by mouth every 12 (twelve) hours as needed for moderate pain or severe pain. 20 tablet 0   No current facility-administered medications for this encounter.    Allergies  Allergen Reactions  . Novocain [Procaine] Swelling   Social History   Socioeconomic History  . Marital status: Married    Spouse name: Not on file  . Number of children: Not on file  . Years of education: Not on file  . Highest education level: Not on file  Occupational History  . Not on file  Social Needs  .  Financial resource strain: Not on file  . Food insecurity:    Worry: Not on file    Inability: Not on file  . Transportation needs:    Medical: Not on file    Non-medical: Not on file  Tobacco Use  . Smoking status: Never Smoker  . Smokeless tobacco: Never Used  Substance and Sexual Activity  . Alcohol use: Yes    Comment: 04/22/2015 "nothing since the 1980s; never had a problem w/it"  . Drug use: No  . Sexual activity: Never  Lifestyle  . Physical activity:    Days per week: Not on file    Minutes per session: Not on file  . Stress: Not on file  Relationships  . Social connections:    Talks on phone: Not on file    Gets together: Not on file    Attends religious service: Not on file    Active member of club or organization: Not on file    Attends meetings of clubs or organizations: Not on file    Relationship status: Not on file  . Intimate partner violence:    Fear of current or ex partner: Not on file    Emotionally abused: Not on file    Physically abused: Not on file    Forced sexual activity: Not on file  Other Topics Concern  . Not on file  Social History Narrative   Married, lives with spouse [name]   4 children, 2 boys and 2 girls > one daughter passed   OCCUPATION: retired, Pension scheme manager for [employer]      Family History  Problem Relation Age of Onset  .  Alzheimer's disease Mother   . COPD Daughter   . Alzheimer's disease Unknown   . COPD Sister   . Depression Sister   . Heart disease Sister   . Hyperlipidemia Sister   . Hypertension Sister   . Esophageal cancer Sister        + smoker    Vitals:   07/11/17 1448  BP: 106/62  Pulse: 87  SpO2: 96%  Weight: 177 lb (80.3 kg)     Wt Readings from Last 3 Encounters:  07/11/17 177 lb (80.3 kg)  07/10/17 173 lb (78.5 kg)  06/27/17 176 lb (79.8 kg)    PHYSICAL EXAM: General: Elderly and fatigued appearing. Apprehensive. SOB.  HEENT: Normal anicteric  Neck: Supple. JVP ~6-7 cm. Carotids 2+ bilat; no bruits. No thyromegaly or nodule noted. Cor: PMI nondisplaced. RRR, No M/G/R noted Lungs: Right lung diminished throughout and dull 3/4 way up. Tachypneic. No wheeze  Abdomen: Soft, non-tender, non-distended, no HSM. No bruits or masses. +BS  Extremities: No cyanosis, clubbing, or rash. Trace ankle edema. Warm  Neuro: Alert & orientedx3, cranial nerves grossly intact. moves all 4 extremities w/o difficulty. Affect flat  ASSESSMENT & PLAN:  1. Recurrent R Pleural effusion - Repeat Thoracentesis 3/11 AND 3/12 with total of 3.2 L removed.  - He has been more SOB since yesterday and lungs dull past 1/2 way up.  Will need repeat thoracentesis.  - Will have him see Dr. Prescott Gum, hopefully next week to discuss Pleurx catheter.   2. Chronic systolic HF due to ICM s/p CRT-D - Echo 05/27/2017 LVEF 20-25% - NYHA IV symptoms in the setting of recurrent pleural effusion - Volume status peripherally and centrally stable. Confounded by significant pleural effusion.  - Continue lasix 40 mg BID - Continue coreg 12.5 mg BID - Continue hydralazine 12.5 mg BID  and imdur 15 mg daily. For now.  - Not on ACE/ARB/ARNI due to CKD   3. CAD s/p CABG/AVR 2009 - No s/s of ischemia.     4. Recent VT - seen by Dr. Curt Bears 06/24/17. Mexilitene increased to TID.  - No change for now.  5. Lung nodules -  CT with contrast 3/8/19with multiple nodules previously noted no longer seen, stable 3 mm LLL nodule noted, new 3 mm RUL nodule noted.  - No change.   6. Chronic AF - Rate controlled.  - Continue Eliquis 2.5 mg BID.   Dr. Haroldine Laws spoke personally with IR who have arranged for Thoracentesis this pm. In the past this has significantly improved his SOB. Will refer to Dr. Prescott Gum for Pleurx as previously discussed with patient. Follow up 2 weeks in clinic, sooner with recurrent symptoms, but hope to be seen by CVTS next week.   Shirley Friar, PA-C  2:48 PM   Patient seen and examined with the above-signed Advanced Practice Provider and/or Housestaff. I personally reviewed laboratory data, imaging studies and relevant notes. I independently examined the patient and formulated the important aspects of the plan. I have edited the note to reflect any of my changes or salient points. I have personally discussed the plan with the patient and/or family.  He has recurrent large right pleural effusion on exam with associated respiratory distress. No evidence of significant HF today. I have talked to Dr. Earleen Newport in IR and they will get him in for repeat thoracentesis today. We will then refer to Dr. Prescott Gum for Pleurex tube. W/u surprisingly negative for cardiac amyloid.   Glori Bickers, MD  3:43 PM

## 2017-07-12 ENCOUNTER — Other Ambulatory Visit (HOSPITAL_COMMUNITY): Payer: Self-pay

## 2017-07-12 ENCOUNTER — Telehealth (HOSPITAL_COMMUNITY): Payer: Self-pay

## 2017-07-12 DIAGNOSIS — J90 Pleural effusion, not elsewhere classified: Secondary | ICD-10-CM

## 2017-07-12 NOTE — Telephone Encounter (Signed)
Per Dr. Haroldine Laws VO and IR recommendation, patient needs to return for repeat IR thoacentesis as he was max drained 2 liters yesterday. Patient unable to come today as he relies on his son for transportation who is at work and unable to get off work. Patient reports feeling much better and does not appear short of breath conversing on the phone. Scheduled for Monday at 1 pm at [facility] IR. Advised patient to report to ED for worsening s/s.  Renee Pain, RN

## 2017-07-15 ENCOUNTER — Other Ambulatory Visit (HOSPITAL_COMMUNITY): Payer: Self-pay | Admitting: Internal Medicine

## 2017-07-15 ENCOUNTER — Other Ambulatory Visit: Payer: Self-pay | Admitting: *Deleted

## 2017-07-15 ENCOUNTER — Ambulatory Visit (HOSPITAL_COMMUNITY)
Admission: RE | Admit: 2017-07-15 | Discharge: 2017-07-15 | Disposition: A | Discharge status: Home / Self Care | Payer: Medicare HMO | Source: Ambulatory Visit | Attending: Internal Medicine | Admitting: Internal Medicine

## 2017-07-15 DIAGNOSIS — J90 Pleural effusion, not elsewhere classified: Secondary | ICD-10-CM | POA: Diagnosis not present

## 2017-07-15 DIAGNOSIS — Z9889 Other specified postprocedural states: Secondary | ICD-10-CM | POA: Insufficient documentation

## 2017-07-15 MED ORDER — LIDOCAINE HCL (PF) 2 % IJ SOLN
INTRAMUSCULAR | Status: AC
  Filled 2017-07-15: qty 20

## 2017-07-15 NOTE — Progress Notes (Signed)
Patient presents for thoracentesis today in anticipation of quick return of symptoms.  At most recent thoracentesis patient had 2 L removed with a significant amount of fluid remaining.  He presents today stating that he feels much better since procedure Thursday. He has been walking more and undergoing his usual activities without difficulty or shortness of breath.   Limited US Chest performed to assess fluid burden.  Shows large amount of fluid with loculations today.   Reviewed case with Dr. Kathlene Cote.  Again, patient asymptomatic for therapeutic procedure with possible plans for upcoming PleurX catheter placement.  No procedure performed today.  Images saved in chart.   Patient instructed to return if he develops shortness of breath.   Brynda Greathouse, MS RD PA-C 2:10 PM

## 2017-07-15 NOTE — Patient Outreach (Signed)
Telephone call to pt for follow up after seeing MD at Heart Failure clinic/ scheduled thoracentesis, spoke with patient's wife [name] who reports pt is not at home, friend transported pt for his second thoracentesis, Beulah reports pt is keeping all scheduled appointments and follow ups related to issues with recurrent pleural effusion.  RN CM ask Beulah (and pt) to call RN CM with any questions, concerns.  PLAN See pt for home visit beginning next month Call pt next week for follow up Pt to see Dr. Prescott Gum about pleurex catheter  Jacqlyn Larsen [facility] [place] Coordinator [phone]

## 2017-07-19 ENCOUNTER — Institutional Professional Consult (permissible substitution): Payer: Medicare HMO | Admitting: Cardiothoracic Surgery

## 2017-07-19 ENCOUNTER — Encounter: Payer: Self-pay | Admitting: Cardiothoracic Surgery

## 2017-07-19 ENCOUNTER — Other Ambulatory Visit: Payer: Self-pay | Admitting: *Deleted

## 2017-07-19 ENCOUNTER — Other Ambulatory Visit: Payer: Self-pay

## 2017-07-19 ENCOUNTER — Encounter: Payer: Medicare HMO | Admitting: Cardiology

## 2017-07-19 VITALS — BP 117/82 | HR 80 | Resp 18 | Ht 72.0 in | Wt 177.0 lb

## 2017-07-19 DIAGNOSIS — I509 Heart failure, unspecified: Secondary | ICD-10-CM

## 2017-07-19 DIAGNOSIS — I481 Persistent atrial fibrillation: Secondary | ICD-10-CM | POA: Diagnosis not present

## 2017-07-19 DIAGNOSIS — J90 Pleural effusion, not elsewhere classified: Secondary | ICD-10-CM

## 2017-07-19 DIAGNOSIS — I5042 Chronic combined systolic (congestive) and diastolic (congestive) heart failure: Secondary | ICD-10-CM | POA: Diagnosis not present

## 2017-07-19 DIAGNOSIS — I4819 Other persistent atrial fibrillation: Secondary | ICD-10-CM

## 2017-07-19 NOTE — Progress Notes (Signed)
PCP is [name], MD Referring Provider is Chalmers Cater Satira Mccallum*  Chief Complaint  Patient presents with  . Pleural Effusion    recurrent, new patient with chest xray, PleurX evaluation    HPI: 84 year old patient presents for evaluation of Pleurx catheter for recurrent right pleural effusion which has required thoracentesis 5 times in the past 6 months.  Patient is currently being treated in the advanced heart failure clinic and placement of right Pleurx catheter has been recommended by the patient's cardiologist.  The patient is status post AVR CABG in 2009 with ejection fraction currently at 25-30% and chronic systolic heart failure.  He has chronic atrial fibrillation on Eliquis and has an AICD with history of VT.  Patient had a right thoracentesis performed approximately a week ago of 2 L.  The patient takes Eliquis every day for atrial fibrillation.  He is also currently on high doses of Lasix with chronic kidney disease  The patient states his breathing significantly improved after each thoracentesis procedure.  Patient was evaluated for cardiac amyloidosis which was negative.  Past Medical History:  Diagnosis Date  . AICD (automatic cardioverter/defibrillator) present   . Arthritis    "minor everywhere" (04/22/2015)  . Asthma    "a touch"  . Barrett esophagus   . CAD (coronary artery disease)    a. s/p 2 vessel CABG with LIMA to LAD, SVG to diagonal 1, SVG to RCA 12/09.  Marland Kitchen Chronic combined systolic and diastolic CHF (congestive heart failure) (HCC)    dry weight 197-200lbs.  . CKD (chronic kidney disease), stage III ([place])   . Diabetes ([place])    "I'm prediabetic" (04/22/2015)  . Dyspnea   . GERD (gastroesophageal reflux disease)   . H/O: rheumatic fever   . Hepatitis 1957   "don't know what kind:  . History of hiatal hernia   . History of PFTs    a. Amiodarone started 5/16 >> PFTs w/ DLCO 6/16:  FEV1 72% predicted, FEV1/FVC 66%, DLCO 66% >> minimal reversible  obstructive airways disease with mild diffusion defect (suggestive of emphysema but absence of hyperinflation inconsistent with dx)  . HTN (hypertension)   . Hypercholesteremia   . NICM (nonischemic cardiomyopathy) ([place])    EF 15-20% by echo 2017  . Orthostatic hypotension   . PAF (paroxysmal atrial fibrillation) ([place])   . Pneumonia 08/2014   "dr thought I may have had a touch"  . S/P AVR (aortic valve replacement) 2009   a. severe AS s/p AVR with pericardial tissue valve 2009.  Marland Kitchen Ventricular tachycardia [facility])     Past Surgical History:  Procedure Laterality Date  . AORTIC VALVE REPLACEMENT  with 23-mm Magna Ease pericardial valve, model number   with 23-mm Magna Ease pericardial valve, model [id number], serial number [id number]   . APPENDECTOMY  1940s  . BI-VENTRICULAR IMPLANTABLE CARDIOVERTER DEFIBRILLATOR  (CRT-D)  04/22/2015  . CARDIAC CATHETERIZATION N/A 11/04/2014   Procedure: Left Heart Cath and Cors/Grafts Angiography;  Surgeon: Leonie Man, MD;  Location: [place] [facility];  Service: Cardiovascular;  Laterality: N/A;  . CARDIAC VALVE REPLACEMENT    . CARDIOVERSION N/A 01/27/2013   Procedure: CARDIOVERSION;  Surgeon: Sueanne Margarita, MD;  Location: [place];  Service: Cardiovascular;  Laterality: N/A;  . CARDIOVERSION N/A 08/25/2014   Procedure: CARDIOVERSION;  Surgeon: Sanda Klein, MD;  [facility];  Service: Cardiovascular;  Laterality: N/A;  . CATARACT EXTRACTION W/ INTRAOCULAR LENS  IMPLANT, BILATERAL Bilateral   . CORONARY ARTERY BYPASS GRAFT  03/10/2008   x 3 Dr. Roxan Hockey  . EP IMPLANTABLE DEVICE N/A 04/22/2015   Procedure: BiV ICD Insertion CRT-D;  Surgeon: [name], MD;  Location: [place] [facility];  Service: Cardiovascular;  Laterality: N/A;  . ESOPHAGOGASTRODUODENOSCOPY (EGD) WITH ESOPHAGEAL DILATION  X1  . ESOPHAGOGASTRODUODENOSCOPY (EGD) WITH PROPOFOL N/A 05/09/2017   Procedure: ESOPHAGOGASTRODUODENOSCOPY (EGD) WITH PROPOFOL;   Surgeon: Carol Ada, MD;  Location: [place];  Service: Endoscopy;  Laterality: N/A;  . IR THORACENTESIS ASP PLEURAL SPACE W/IMG GUIDE  05/27/2017  . IR THORACENTESIS ASP PLEURAL SPACE W/IMG GUIDE  06/17/2017  . IR THORACENTESIS ASP PLEURAL SPACE W/IMG GUIDE  06/18/2017  . IR THORACENTESIS ASP PLEURAL SPACE W/IMG GUIDE  07/11/2017  . RIGHT HEART CATH AND CORONARY/GRAFT ANGIOGRAPHY N/A 05/31/2017   Procedure: RIGHT HEART CATH AND CORONARY/GRAFT ANGIOGRAPHY;  Surgeon: Jolaine Artist, MD;  Location: [place] [facility];  Service: Cardiovascular;  Laterality: N/A;  . TONSILLECTOMY      Family History  Problem Relation Age of Onset  . Alzheimer's disease Mother   . COPD Daughter   . Alzheimer's disease Unknown   . COPD Sister   . Depression Sister   . Heart disease Sister   . Hyperlipidemia Sister   . Hypertension Sister   . Esophageal cancer Sister        + smoker    Social History Social History   Tobacco Use  . Smoking status: Never Smoker  . Smokeless tobacco: Never Used  Substance Use Topics  . Alcohol use: Yes    Comment: 04/22/2015 "nothing since the 1980s; never had a problem w/it"  . Drug use: No    Current Outpatient Medications  Medication Sig Dispense Refill  . acetaminophen (TYLENOL) 500 MG tablet Take 1,000 mg by mouth every 6 (six) hours as needed for headache (pain).     Marland Kitchen albuterol (PROVENTIL HFA;VENTOLIN HFA) 108 (90 Base) MCG/ACT inhaler Inhale 1-2 puffs into the lungs every 6 (six) hours as needed for wheezing.     . Amino Acids-Protein Hydrolys (FEEDING SUPPLEMENT, PRO-STAT SUGAR FREE 64,) LIQD Take 30 mLs by mouth daily. 900 mL 0  . atorvastatin (LIPITOR) 20 MG tablet TAKE 1 TABLET (20 MG TOTAL) DAILY AT 6 PM. 90 tablet 2  . benzonatate (TESSALON) 200 MG capsule Take 1 capsule (200 mg total) by mouth 2 (two) times daily as needed for cough. 20 capsule 0  . carvedilol (COREG) 12.5 MG tablet Take 12.5 mg by mouth 2 (two) times daily.    Marland Kitchen ELIQUIS 2.5 MG  TABS tablet TAKE 1 TABLET TWICE DAILY (Patient taking differently: TAKE 1 TABLET (2.5 MG) BY MOUTH TWICE DAILY) 180 tablet 2  . feeding supplement, ENSURE ENLIVE, (ENSURE ENLIVE) LIQD Take 237 mLs by mouth 2 (two) times daily between meals. 60 Bottle 0  . furosemide (LASIX) 40 MG tablet Take 1 tablet (40 mg total) by mouth 2 (two) times daily. 60 tablet 0  . hydrALAZINE (APRESOLINE) 25 MG tablet Take 12.5 mg by mouth 2 (two) times daily.    . isosorbide mononitrate (IMDUR) 30 MG 24 hr tablet Take 0.5 tablets (15 mg total) by mouth every evening.    . lactose free nutrition (BOOST) LIQD Take 237 mLs by mouth 3 (three) times daily as needed (meal supplement).    . methimazole (TAPAZOLE) 10 MG tablet Take 1 tablet (10 mg total) by mouth 2 (two) times daily. 180 tablet 0  . mexiletine (MEXITIL) 250 MG capsule Take 1 capsule (250  mg total) by mouth 3 (three) times daily. 90 capsule 6  . mirtazapine (REMERON) 15 MG tablet Take 1 tablet (15 mg total) by mouth at bedtime. 30 tablet 0  . Multiple Vitamin (MULTIVITAMIN WITH MINERALS) TABS tablet Take 1 tablet by mouth 2 (two) times daily.     Marland Kitchen omega-3 acid ethyl esters (LOVAZA) 1 G capsule Take 2 g by mouth 2 (two) times daily.    . ondansetron (ZOFRAN-ODT) 4 MG disintegrating tablet Take 4 mg by mouth every 8 (eight) hours as needed for nausea or vomiting.     . pantoprazole (PROTONIX) 40 MG tablet Take 1 tablet (40 mg total) by mouth daily before breakfast. 30 tablet 3  . potassium chloride SA (K-DUR,KLOR-CON) 20 MEQ tablet Take 1 tablet (20 mEq total) by mouth daily. 30 tablet 3  . predniSONE (DELTASONE) 10 MG tablet Take 2 tablets (20 mg total) by mouth daily with breakfast. 60 tablet 3  . ranitidine (ZANTAC) 300 MG tablet Take 300 mg by mouth at bedtime.    . traMADol (ULTRAM) 50 MG tablet Take 1 tablet (50 mg total) by mouth every 12 (twelve) hours as needed for moderate pain or severe pain. 20 tablet 0   No current facility-administered medications  for this visit.     Allergies  Allergen Reactions  . Novocain [Procaine] Swelling    Review of Systems        Review of Systems :  [ y ] = yes, [  ] = no        General :  Weight gain [yes]    Weight loss  [   ]  Fatigue [yes]  Fever [  ]  Chills  [  ]                                Weakness  [  ]           HEENT    Headache [  ]  Dizziness [  ]  Blurred vision [  ] Glaucoma  [  ]                          Nosebleeds [  ] Painful or loose teeth [  ]        Cardiac :  Chest pain/ pressure [  ]  Resting SOB [yes] exertional SOB yes [  ]                        Orthopnea [  ]  Pedal edema  [  ]  Palpitations [yes] Syncope/presyncope _0                         Paroxysmal nocturnal dyspnea [  ]         Pulmonary : cough [yes]  wheezing [  ]  Hemoptysis [  ] Sputum [  ] Snoring [  ]                              Pneumothorax [  ]  Sleep apnea [  ]        GI : Vomiting [  ]  Dysphagia [  ]  Melena  [  ]  Abdominal pain [  ] BRBPR [  ]  Heart burn [  ]  Constipation [  ] Diarrhea  [  ] Colonoscopy [   ]        GU : Hematuria [  ]  Dysuria [  ]  Nocturia [  ] UTI's [  ]        Vascular : Claudication [  ]  Rest pain [  ]  DVT [  ] Vein stripping [  ] leg ulcers [  ]                          TIA [  ] Stroke [  ]  Varicose veins [  ]        NEURO :  Headaches  [  ] Seizures [  ] Vision changes [  ] Paresthesias [  ]                                       Seizures [  ]        Musculoskeletal :  Arthritis [  ] Gout  [  ]  Back pain [  ]  Joint pain [  ]        Skin :  Rash [  ]  Melanoma [  ] Sores [  ]        Heme : Bleeding problems [  ]Clotting Disorders [  ] Anemia [  ]Blood Transfusion _0         Endocrine : Diabetes [  ] Heat or Cold intolerance [  ] Polyuria [  ]excessive thirst _1         Psych : Depression [  ]  Anxiety [  ]  Psych hospitalizations [  ] Memory change [  ]                                               BP 117/82 (BP Location: Left Arm, Patient  Position: Sitting, Cuff Size: Normal)   Pulse 80   Resp 18   Ht 6' (1.829 m)   Wt 177 lb (80.3 kg)   SpO2 97% Comment: RA  BMI 24.01 kg/m  Physical Exam      Exam    General- alert and comfortable    Neck- no JVD, no cervical adenopathy palpable, no carotid bruit   Lungs- clear.  Well-healed sternal incision on the left side, reduced breath sounds at right base   Cor-rate atrial fib with  Irregular rhythm, n0 murmur , gallop   Abdomen- soft, non-tender   Extremities - warm, non-tender, minimal edema   Neuro- oriented, appropriate, no focal weakness   Diagnostic Tests: Recent chest x-ray images and CT scan of chest reviewed showing presence of a right pleural effusion which is reduced after thoracentesis procedures but then reoccurs.  Patient is status post sternotomy for prior AVR CABG 10 years ago by Dr. Roxan Hockey  Impression: Recurrent right pleural effusion from chronic mixed systolic and diastolic heart failure.  Poorly controlled with medical therapy Agree that Pleurx catheter would provide patient relief from shortness of breath related to his recurrent right pleural effusion. Plan: Patient be set up for right Pleurx catheter placement as outpatient at [facility] April 18.  He  will stop his Eliquis last dose on p.m. April 15. He understands the benefits and risks of Pleurx catheter placement for his recurrent right pleural effusion from heart failure.  Len Childs, MD [facility] [phone]

## 2017-07-22 ENCOUNTER — Other Ambulatory Visit: Payer: Self-pay | Admitting: *Deleted

## 2017-07-22 NOTE — Patient Outreach (Signed)
Telephone call to pt for follow up, spoke with pt, HIPAA verified, pt states he did have thoracentesis and is having pleurex cathter placed this Thursday 07/25/17 for ongoing pleural effusion, pt reports dyspnea with exertion, weight 172 pounds and home health continues to see pt.  Pt is following up closely with his doctors.  [facility] CM Care Plan Problem One     Most Recent Value  Care Plan Problem One  Knowledge deficit related to CHF  Role Documenting the Problem One  Care Management Coordinator  Care Plan for Problem One  Active  [facility] Long Term Goal   Pt will verbalize/ demonstrate better understanding self care for CHF within 60 days  [facility] Long Term Goal Start Date  06/06/17  Interventions for Problem One Long Term Goal  RN CM reviewed importance of continued follow up with MD related to pleural effusion, pt continues working with home health and weighin daily.  [facility] CM Short Term Goal #1   Pt will verbalize CHF zones / action plan within 30 days  [facility] CM Short Term Goal #1 Start Date  07/10/17 [goal re-established]  Interventions for Short Term Goal #1  RN CM reviewed CHF zones/ action plan  [facility] CM Short Term Goal #2   Pt will weigh daily and record within 30 days  [facility] CM Short Term Goal #2 Start Date  06/06/17  [facility] CM Short Term Goal #2 Met Date  07/10/17    [facility] CM Care Plan Problem Two     Most Recent Value  Care Plan Problem Two  Pt has decreased endurance  Role Documenting the Problem Two  Care Management Malcolm for Problem Two  Active  [facility] CM Short Term Goal #1   Pt will work with home health PT and complete prescribed exercises, verbalize increase endurance within 30 days  [facility] CM Short Term Goal #1 Start Date  07/10/17 [goal restarted]  Interventions for Short Term Goal #2   pt continues to work with home health PT, pt continues to have decreased endurance, dyspnea with exertion      PLAN See pt for home visit beginning of next month  Jacqlyn Larsen [facility], [place] [phone]

## 2017-07-24 ENCOUNTER — Encounter (HOSPITAL_COMMUNITY): Payer: Self-pay | Admitting: *Deleted

## 2017-07-24 ENCOUNTER — Other Ambulatory Visit: Payer: Self-pay

## 2017-07-24 NOTE — Progress Notes (Signed)
Spoke with pt for pre-op call. Pt has extensive cardiac history, CAD with CABG, Aortic valve replacement, CHF, A-fib and has an ICD. Pt denies any recent chest pain. Sees Dr. Radford Pax, Dr. Curt Bears and Dr. Jeffie Pollock. Pt states he Pre-diabetic. States he does not check his blood sugar at home. Last A1C was 6.3 on 05/08/17.

## 2017-07-24 NOTE — Progress Notes (Signed)
Anesthesia Chart Review:    Pt is a same day work up   Case:  [id number] Date/Time:  07/25/17 1351   Procedure:  INSERTION PLEURAL DRAINAGE CATHETER (Right Chest)   Anesthesia type:  Monitor Anesthesia Care   Pre-op diagnosis:  RIGHT PLEURAL EFFUSION   [facility]:  [facility] OR ROOM 26 / [place] OR   Surgeon:  Ivin Poot, MD      DISCUSSION:  - Pt is an 84 year old male with hx CAD (s/p CABG 2009), severe AS (s/p pericardial AVR 2009), chronic CHF (EF 25-30%, has AICD); has had V tach on ICD interrogation recently; pt changed from amiodarone to mexiletine by Dr. Curt Bears.  - S/p multiple right thoracentesis procedures for recurrent R pleural effusions in recent months, last 07/11/17 for 2 liters of fluid.  Cardiologist Dr. Haroldine Laws referred pt for procedure. Last dose eliquis evening of 07/22/17.    PROVIDERS: Patient Care Team: Constance Haw, MD as Consulting Physician (Cardiology)  - Cardiologist is [name], MD - HF cardiologist is Glori Bickers, MD - PCP is [name], MD  LABS:  Will be obtained day of surgery   IMAGES:  1 view CXR 07/12/17:  - Moderate right pleural effusion and right lower lobe atelectasis or infiltrate. No pneumothorax following thoracentesis. - Mild cardiomegaly.  CT chest 06/14/17:  1. Most of the previously noted index nodules are not confidently identified on today's study and were presumably postinflammatory. There is a single previously noted 3 mm left lower lobe lung nodule which is stable. 2. 3 mm right upper lobe lung nodule was not identified on prior exams. No follow-up needed if patient is low-risk. Non-contrast chest CT can be considered in 12 months if patient is high-risk.  3. Stable to mild increase in volume of large right pleural effusion. 4. Decrease in size of previously enlarged AP window lymph node. 5.  Aortic Atherosclerosis. 6. Enlargement of the main pulmonary artery suggestive of PA hypertension   EKG 06/19/17: Undetermined  rhythm. Suspect VT. Rightward axis. Non-specific intra-ventricular conduction block.   CV:  Cardiac cath 05/31/17:   Prox LAD to Mid LAD lesion is 50% stenosed.  Mid LAD to Dist LAD lesion is 25% stenosed.  1st Mrg lesion is 50% stenosed.  Prox RCA to Dist RCA lesion is 100% stenosed.  And is large.  Prox Graft to Mid Graft lesion is 30% stenosed.  And is large.  The flow in the graft is reversed.  There is competitive flow.  And is large.  There is competitive flow.  Ost 1st Diag to 1st Diag lesion is 90% stenosed.   Findings: Ao = 145/67 (99)  RA = 6 RV = 70/9 PA = 69/30 (46) PCW = 26 v = 37 Fick cardiac output/index =4.7/2.3 PVR = 4.3 FA sat = 98% PA sat = 63%, 60%  Assessment: 1. 3v CAD with stable revascularization with patent LIMA-LAD, SVG-diagonal and SVG-RCA 2. 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 Normal cardiac output - no evidence of thyrotoxicosis heart disease or PH   Echo 05/29/17:  - Left ventricle: The cavity size was normal. Wall thickness was increased in a pattern of mild LVH. Systolic function was severely reduced. The estimated ejection fraction was in the range of 20% to 25%. There is akinesis of the inferolateral, inferior, and inferoseptal myocardium. Features are consistent with a pseudonormal left ventricular filling pattern, with concomitant abnormal relaxation and increased  filling pressure (grade 2 diastolic dysfunction). - Aortic valve: A pericardial tissue valve bioprosthesis was present and functioning normally. Peak velocity (S): 226 cm/s. Mean gradient (S): 10 mm Hg. Valve area (VTI): 1.52 cm^2. Valve area (Vmax): 1.5 cm^2. Valve area (Vmean): 1.59 cm^2. - Mitral valve: Calcified annulus. Mildly thickened leaflets. There was mild regurgitation. - Left atrium: The atrium was severely dilated. - Right ventricle: Pacer wire or catheter noted in right ventricle. - Pulmonary  arteries: Systolic pressure was mildly increased. PA peak pressure: 35 mm Hg (S). - Impressions: Compared to the prior study, there has been no significant interval change.    Past Medical History:  Diagnosis Date  . AICD (automatic cardioverter/defibrillator) present   . Arthritis    "minor everywhere" (04/22/2015)  . Asthma    "a touch"  . Barrett esophagus   . CAD (coronary artery disease)    a. s/p 2 vessel CABG with LIMA to LAD, SVG to diagonal 1, SVG to RCA 12/09.  Marland Kitchen Chronic combined systolic and diastolic CHF (congestive heart failure) (HCC)    dry weight 197-200lbs.  . CKD (chronic kidney disease), stage III ([place])   . Dyspnea   . GERD (gastroesophageal reflux disease)   . H/O: rheumatic fever   . Hepatitis 1957   "don't know what kind:  . History of hiatal hernia   . History of PFTs    a. Amiodarone started 5/16 >> PFTs w/ DLCO 6/16:  FEV1 72% predicted, FEV1/FVC 66%, DLCO 66% >> minimal reversible obstructive airways disease with mild diffusion defect (suggestive of emphysema but absence of hyperinflation inconsistent with dx)  . HTN (hypertension)   . Hypercholesteremia   . NICM (nonischemic cardiomyopathy) ([place])    EF 15-20% by echo 2017  . Orthostatic hypotension   . PAF (paroxysmal atrial fibrillation) ([place])   . Pneumonia 08/2014   "dr thought I may have had a touch"  . Pre-diabetes   . S/P AVR (aortic valve replacement) 2009   a. severe AS s/p AVR with pericardial tissue valve 2009.  Marland Kitchen Ventricular tachycardia [facility])     Past Surgical History:  Procedure Laterality Date  . AORTIC VALVE REPLACEMENT  with 23-mm Magna Ease pericardial valve, model number   with 23-mm Magna Ease pericardial valve, model [id number], serial number [id number]   . APPENDECTOMY  1940s  . BI-VENTRICULAR IMPLANTABLE CARDIOVERTER DEFIBRILLATOR  (CRT-D)  04/22/2015  . CARDIAC CATHETERIZATION N/A 11/04/2014   Procedure: Left Heart Cath and Cors/Grafts Angiography;  Surgeon: Leonie Man,  MD;  Location: [place] [facility];  Service: Cardiovascular;  Laterality: N/A;  . CARDIAC VALVE REPLACEMENT    . CARDIOVERSION N/A 01/27/2013   Procedure: CARDIOVERSION;  Surgeon: Sueanne Margarita, MD;  Location: [place];  Service: Cardiovascular;  Laterality: N/A;  . CARDIOVERSION N/A 08/25/2014   Procedure: CARDIOVERSION;  Surgeon: Sanda Klein, MD;  [facility];  Service: Cardiovascular;  Laterality: N/A;  . CATARACT EXTRACTION W/ INTRAOCULAR LENS  IMPLANT, BILATERAL Bilateral   . CORONARY ARTERY BYPASS GRAFT  03/10/2008   x 3 Dr. Roxan Hockey  . EP IMPLANTABLE DEVICE N/A 04/22/2015   Procedure: BiV ICD Insertion CRT-D;  Surgeon: [name], MD;  Location: [place] [facility];  Service: Cardiovascular;  Laterality: N/A;  . ESOPHAGOGASTRODUODENOSCOPY (EGD) WITH ESOPHAGEAL DILATION  X1  . ESOPHAGOGASTRODUODENOSCOPY (EGD) WITH PROPOFOL N/A 05/09/2017   Procedure: ESOPHAGOGASTRODUODENOSCOPY (EGD) WITH PROPOFOL;  Surgeon: Carol Ada, MD;  Location: [place];  Service: Endoscopy;  Laterality: N/A;  . IR  THORACENTESIS ASP PLEURAL SPACE W/IMG GUIDE  05/27/2017  . IR THORACENTESIS ASP PLEURAL SPACE W/IMG GUIDE  06/17/2017  . IR THORACENTESIS ASP PLEURAL SPACE W/IMG GUIDE  06/18/2017  . IR THORACENTESIS ASP PLEURAL SPACE W/IMG GUIDE  07/11/2017  . RIGHT HEART CATH AND CORONARY/GRAFT ANGIOGRAPHY N/A 05/31/2017   Procedure: RIGHT HEART CATH AND CORONARY/GRAFT ANGIOGRAPHY;  Surgeon: Jolaine Artist, MD;  Location: [place] [facility];  Service: Cardiovascular;  Laterality: N/A;  . TONSILLECTOMY      MEDICATIONS: No current facility-administered medications for this encounter.    Marland Kitchen acetaminophen (TYLENOL) 500 MG tablet  . albuterol (PROVENTIL HFA;VENTOLIN HFA) 108 (90 Base) MCG/ACT inhaler  . Amino Acids-Protein Hydrolys (FEEDING SUPPLEMENT, PRO-STAT SUGAR FREE 64,) LIQD  . atorvastatin (LIPITOR) 20 MG tablet  . carvedilol (COREG) 12.5 MG tablet  . ELIQUIS 2.5 MG TABS  tablet  . furosemide (LASIX) 40 MG tablet  . hydrALAZINE (APRESOLINE) 25 MG tablet  . isosorbide mononitrate (IMDUR) 30 MG 24 hr tablet  . lactose free nutrition (BOOST) LIQD  . methimazole (TAPAZOLE) 10 MG tablet  . mexiletine (MEXITIL) 250 MG capsule  . mirtazapine (REMERON) 15 MG tablet  . Multiple Vitamin (MULTIVITAMIN WITH MINERALS) TABS tablet  . Omega-3 Fatty Acids (FISH OIL) 1000 MG CAPS  . pantoprazole (PROTONIX) 40 MG tablet  . potassium chloride SA (K-DUR,KLOR-CON) 20 MEQ tablet  . predniSONE (DELTASONE) 10 MG tablet  . ranitidine (ZANTAC) 300 MG tablet  . traMADol (ULTRAM) 50 MG tablet  . benzonatate (TESSALON) 200 MG capsule  . feeding supplement, ENSURE ENLIVE, (ENSURE ENLIVE) LIQD  - Pt to take last dose eliquis evening of 07/22/17.    If labs acceptable day of surgery, I anticipate pt can proceed with surgery as scheduled.   Willeen Cass, FNP-BC [facility] Short Stay Surgical Center/Anesthesiology Phone: [phone] 07/24/2017 3:48 PM

## 2017-07-26 ENCOUNTER — Encounter (HOSPITAL_COMMUNITY): Payer: Self-pay | Admitting: *Deleted

## 2017-07-26 ENCOUNTER — Ambulatory Visit (HOSPITAL_COMMUNITY): Payer: Medicare HMO

## 2017-07-26 ENCOUNTER — Ambulatory Visit (HOSPITAL_COMMUNITY)
Admission: RE | Admit: 2017-07-26 | Discharge: 2017-07-26 | Disposition: A | Discharge status: Home / Self Care | Payer: Medicare HMO | Source: Ambulatory Visit | Attending: Cardiothoracic Surgery | Admitting: Cardiothoracic Surgery

## 2017-07-26 ENCOUNTER — Encounter (HOSPITAL_COMMUNITY)
Admission: RE | Disposition: A | Discharge status: Home / Self Care | Payer: Self-pay | Source: Ambulatory Visit | Attending: Cardiothoracic Surgery

## 2017-07-26 ENCOUNTER — Ambulatory Visit (HOSPITAL_COMMUNITY): Payer: Medicare HMO | Admitting: Emergency Medicine

## 2017-07-26 ENCOUNTER — Other Ambulatory Visit: Payer: Self-pay

## 2017-07-26 DIAGNOSIS — Z419 Encounter for procedure for purposes other than remedying health state, unspecified: Secondary | ICD-10-CM

## 2017-07-26 DIAGNOSIS — I5042 Chronic combined systolic (congestive) and diastolic (congestive) heart failure: Secondary | ICD-10-CM | POA: Diagnosis not present

## 2017-07-26 DIAGNOSIS — M1990 Unspecified osteoarthritis, unspecified site: Secondary | ICD-10-CM | POA: Diagnosis not present

## 2017-07-26 DIAGNOSIS — I130 Hypertensive heart and chronic kidney disease with heart failure and stage 1 through stage 4 chronic kidney disease, or unspecified chronic kidney disease: Secondary | ICD-10-CM | POA: Insufficient documentation

## 2017-07-26 DIAGNOSIS — Z95810 Presence of automatic (implantable) cardiac defibrillator: Secondary | ICD-10-CM | POA: Diagnosis not present

## 2017-07-26 DIAGNOSIS — E7800 Pure hypercholesterolemia, unspecified: Secondary | ICD-10-CM | POA: Insufficient documentation

## 2017-07-26 DIAGNOSIS — J45909 Unspecified asthma, uncomplicated: Secondary | ICD-10-CM | POA: Diagnosis not present

## 2017-07-26 DIAGNOSIS — I459 Conduction disorder, unspecified: Secondary | ICD-10-CM | POA: Insufficient documentation

## 2017-07-26 DIAGNOSIS — Z8619 Personal history of other infectious and parasitic diseases: Secondary | ICD-10-CM | POA: Diagnosis not present

## 2017-07-26 DIAGNOSIS — I340 Nonrheumatic mitral (valve) insufficiency: Secondary | ICD-10-CM | POA: Diagnosis not present

## 2017-07-26 DIAGNOSIS — I429 Cardiomyopathy, unspecified: Secondary | ICD-10-CM | POA: Diagnosis not present

## 2017-07-26 DIAGNOSIS — Z884 Allergy status to anesthetic agent status: Secondary | ICD-10-CM | POA: Insufficient documentation

## 2017-07-26 DIAGNOSIS — I480 Paroxysmal atrial fibrillation: Secondary | ICD-10-CM | POA: Insufficient documentation

## 2017-07-26 DIAGNOSIS — E1122 Type 2 diabetes mellitus with diabetic chronic kidney disease: Secondary | ICD-10-CM | POA: Diagnosis not present

## 2017-07-26 DIAGNOSIS — J90 Pleural effusion, not elsewhere classified: Secondary | ICD-10-CM

## 2017-07-26 DIAGNOSIS — I509 Heart failure, unspecified: Secondary | ICD-10-CM

## 2017-07-26 DIAGNOSIS — K219 Gastro-esophageal reflux disease without esophagitis: Secondary | ICD-10-CM | POA: Diagnosis not present

## 2017-07-26 DIAGNOSIS — Z7901 Long term (current) use of anticoagulants: Secondary | ICD-10-CM | POA: Insufficient documentation

## 2017-07-26 DIAGNOSIS — Z952 Presence of prosthetic heart valve: Secondary | ICD-10-CM | POA: Insufficient documentation

## 2017-07-26 DIAGNOSIS — N183 Chronic kidney disease, stage 3 (moderate): Secondary | ICD-10-CM | POA: Diagnosis not present

## 2017-07-26 DIAGNOSIS — I2510 Atherosclerotic heart disease of native coronary artery without angina pectoris: Secondary | ICD-10-CM | POA: Insufficient documentation

## 2017-07-26 DIAGNOSIS — Z79899 Other long term (current) drug therapy: Secondary | ICD-10-CM | POA: Diagnosis not present

## 2017-07-26 DIAGNOSIS — I700 Atherosclerosis of aorta: Secondary | ICD-10-CM | POA: Insufficient documentation

## 2017-07-26 DIAGNOSIS — Z951 Presence of aortocoronary bypass graft: Secondary | ICD-10-CM | POA: Diagnosis not present

## 2017-07-26 DIAGNOSIS — I482 Chronic atrial fibrillation: Secondary | ICD-10-CM | POA: Diagnosis not present

## 2017-07-26 DIAGNOSIS — Z9689 Presence of other specified functional implants: Secondary | ICD-10-CM

## 2017-07-26 HISTORY — PX: CHEST TUBE INSERTION: SHX231

## 2017-07-26 HISTORY — DX: Prediabetes: R73.03

## 2017-07-26 LAB — CBC
HCT: 35.7 % — ABNORMAL LOW (ref 39.0–52.0)
Hemoglobin: 11.4 g/dL — ABNORMAL LOW (ref 13.0–17.0)
MCH: 27.1 pg (ref 26.0–34.0)
MCHC: 31.9 g/dL (ref 30.0–36.0)
MCV: 84.8 fL (ref 78.0–100.0)
Platelets: 307 10*3/uL (ref 150–400)
RBC: 4.21 MIL/uL — ABNORMAL LOW (ref 4.22–5.81)
RDW: 19 % — ABNORMAL HIGH (ref 11.5–15.5)
WBC: 7.8 10*3/uL (ref 4.0–10.5)

## 2017-07-26 LAB — COMPREHENSIVE METABOLIC PANEL
ALT: 44 U/L (ref 17–63)
AST: 32 U/L (ref 15–41)
Albumin: 1.7 g/dL — ABNORMAL LOW (ref 3.5–5.0)
Alkaline Phosphatase: 96 U/L (ref 38–126)
Anion gap: 12 (ref 5–15)
BUN: 18 mg/dL (ref 6–20)
CO2: 19 mmol/L — ABNORMAL LOW (ref 22–32)
Calcium: 7.5 mg/dL — ABNORMAL LOW (ref 8.9–10.3)
Chloride: 105 mmol/L (ref 101–111)
Creatinine, Ser: 1.26 mg/dL — ABNORMAL HIGH (ref 0.61–1.24)
GFR calc Af Amer: 60 mL/min — ABNORMAL LOW (ref >60)
GFR calc non Af Amer: 51 mL/min — ABNORMAL LOW (ref >60)
Glucose, Bld: 100 mg/dL — ABNORMAL HIGH (ref 65–99)
Potassium: 3.9 mmol/L (ref 3.5–5.1)
Sodium: 136 mmol/L (ref 135–145)
Total Bilirubin: 0.6 mg/dL (ref 0.3–1.2)
Total Protein: 3.9 g/dL — ABNORMAL LOW (ref 6.5–8.1)

## 2017-07-26 LAB — APTT: aPTT: 42 seconds — ABNORMAL HIGH (ref 24–36)

## 2017-07-26 LAB — PROTIME-INR
INR: 1.55
Prothrombin Time: 18.5 seconds — ABNORMAL HIGH (ref 11.4–15.2)

## 2017-07-26 SURGERY — INSERTION, PLEURAL DRAINAGE CATHETER
Anesthesia: Monitor Anesthesia Care | Site: Chest | Laterality: Right

## 2017-07-26 MED ORDER — CEFAZOLIN SODIUM-DEXTROSE 2-4 GM/100ML-% IV SOLN
2.0000 g | INTRAVENOUS | Status: AC
  Administered 2017-07-26: 2 g via INTRAVENOUS

## 2017-07-26 MED ORDER — CEFAZOLIN SODIUM-DEXTROSE 2-4 GM/100ML-% IV SOLN
INTRAVENOUS | Status: AC
Start: 2017-07-26 — End: 2017-07-26
  Filled 2017-07-26: qty 100

## 2017-07-26 MED ORDER — FENTANYL CITRATE (PF) 250 MCG/5ML IJ SOLN
INTRAMUSCULAR | Status: AC
  Filled 2017-07-26: qty 5

## 2017-07-26 MED ORDER — PHENYLEPHRINE 40 MCG/ML (10ML) SYRINGE FOR IV PUSH (FOR BLOOD PRESSURE SUPPORT)
PREFILLED_SYRINGE | INTRAVENOUS | Status: AC
  Filled 2017-07-26: qty 10

## 2017-07-26 MED ORDER — ACETAMINOPHEN 650 MG RE SUPP: 650.0000 mg | RECTAL | Status: DC | PRN

## 2017-07-26 MED ORDER — CEFAZOLIN SODIUM 1 G IJ SOLR
INTRAMUSCULAR | Status: AC
  Filled 2017-07-26: qty 20

## 2017-07-26 MED ORDER — OXYCODONE HCL 5 MG PO TABS: 5.0000 mg | ORAL_TABLET | ORAL | Status: DC | PRN

## 2017-07-26 MED ORDER — SODIUM CHLORIDE 0.9% FLUSH: 3.0000 mL | INTRAVENOUS | Status: DC | PRN

## 2017-07-26 MED ORDER — PROPOFOL 500 MG/50ML IV EMUL
INTRAVENOUS | Status: DC | PRN
  Administered 2017-07-26: 50 ug/kg/min via INTRAVENOUS

## 2017-07-26 MED ORDER — PHENYLEPHRINE HCL 10 MG/ML IJ SOLN
INTRAMUSCULAR | Status: DC | PRN
  Administered 2017-07-26: 200 ug via INTRAVENOUS
  Administered 2017-07-26: 80 ug via INTRAVENOUS
  Administered 2017-07-26: 160 ug via INTRAVENOUS
  Administered 2017-07-26: 240 ug via INTRAVENOUS
  Administered 2017-07-26: 160 ug via INTRAVENOUS

## 2017-07-26 MED ORDER — SODIUM CHLORIDE 0.9% FLUSH: 3.0000 mL | Freq: Two times a day (BID) | INTRAVENOUS | Status: DC

## 2017-07-26 MED ORDER — FENTANYL CITRATE (PF) 100 MCG/2ML IJ SOLN: 25.0000 ug | INTRAMUSCULAR | Status: DC | PRN

## 2017-07-26 MED ORDER — LACTATED RINGERS IV SOLN
INTRAVENOUS | Status: DC
  Administered 2017-07-26: 13:00:00 via INTRAVENOUS

## 2017-07-26 MED ORDER — OXYCODONE HCL 5 MG PO TABS: 5.0000 mg | ORAL_TABLET | Freq: Once | ORAL | Status: DC | PRN

## 2017-07-26 MED ORDER — FENTANYL CITRATE (PF) 100 MCG/2ML IJ SOLN
INTRAMUSCULAR | Status: DC | PRN
  Administered 2017-07-26 (×2): 50 ug via INTRAVENOUS

## 2017-07-26 MED ORDER — PROPOFOL 10 MG/ML IV BOLUS
INTRAVENOUS | Status: AC
  Filled 2017-07-26: qty 20

## 2017-07-26 MED ORDER — ACETAMINOPHEN 325 MG PO TABS: 650.0000 mg | ORAL_TABLET | ORAL | Status: DC | PRN

## 2017-07-26 MED ORDER — LIDOCAINE HCL 1 % IJ SOLN
INTRAMUSCULAR | Status: DC | PRN
  Administered 2017-07-26: 10 mL

## 2017-07-26 MED ORDER — ACETAMINOPHEN 500 MG PO TABS: 1000.0000 mg | ORAL_TABLET | Freq: Four times a day (QID) | ORAL | Status: DC

## 2017-07-26 MED ORDER — OXYCODONE HCL 5 MG/5ML PO SOLN: 5.0000 mg | Freq: Once | ORAL | Status: DC | PRN

## 2017-07-26 MED ORDER — 0.9 % SODIUM CHLORIDE (POUR BTL) OPTIME
TOPICAL | Status: DC | PRN
  Administered 2017-07-26: 1000 mL

## 2017-07-26 MED ORDER — LIDOCAINE 2% (20 MG/ML) 5 ML SYRINGE
INTRAMUSCULAR | Status: AC
  Filled 2017-07-26: qty 5

## 2017-07-26 MED ORDER — SODIUM CHLORIDE 0.9 % IV SOLN: 250.0000 mL | INTRAVENOUS | Status: DC | PRN

## 2017-07-26 SURGICAL SUPPLY — 28 items
ADH SKN CLS APL DERMABOND .7 (GAUZE/BANDAGES/DRESSINGS) ×1
CANISTER SUCT 3000ML PPV (MISCELLANEOUS) ×3 IMPLANT
COVER SURGICAL LIGHT HANDLE (MISCELLANEOUS) ×3 IMPLANT
DERMABOND ADVANCED (GAUZE/BANDAGES/DRESSINGS) ×2
DERMABOND ADVANCED .7 DNX12 (GAUZE/BANDAGES/DRESSINGS) ×1 IMPLANT
DRAPE C-ARM 42X72 X-RAY (DRAPES) ×3 IMPLANT
DRAPE LAPAROSCOPIC ABDOMINAL (DRAPES) ×3 IMPLANT
GLOVE BIO SURGEON STRL SZ7.5 (GLOVE) ×6 IMPLANT
GOWN STRL REUS W/ TWL LRG LVL3 (GOWN DISPOSABLE) ×2 IMPLANT
GOWN STRL REUS W/TWL LRG LVL3 (GOWN DISPOSABLE) ×6
KIT BASIN OR (CUSTOM PROCEDURE TRAY) ×3 IMPLANT
KIT PLEURX DRAIN CATH 1000ML (MISCELLANEOUS) ×3 IMPLANT
KIT PLEURX DRAIN CATH 15.5FR (DRAIN) ×3 IMPLANT
KIT TURNOVER KIT B (KITS) ×3 IMPLANT
NDL HYPO 25GX1X1/2 BEV (NEEDLE) ×1 IMPLANT
NEEDLE HYPO 25GX1X1/2 BEV (NEEDLE) ×3 IMPLANT
NS IRRIG 1000ML POUR BTL (IV SOLUTION) ×3 IMPLANT
PACK GENERAL/GYN (CUSTOM PROCEDURE TRAY) ×3 IMPLANT
PAD ARMBOARD 7.5X6 YLW CONV (MISCELLANEOUS) ×6 IMPLANT
SET DRAINAGE LINE (MISCELLANEOUS) IMPLANT
SUT ETHILON 3 0 PS 1 (SUTURE) ×3 IMPLANT
SUT SILK 2 0 SH (SUTURE) ×3 IMPLANT
SUT VIC AB 3-0 SH 8-18 (SUTURE) ×3 IMPLANT
SYR CONTROL 10ML LL (SYRINGE) ×3 IMPLANT
TOWEL GREEN STERILE (TOWEL DISPOSABLE) ×3 IMPLANT
TOWEL GREEN STERILE FF (TOWEL DISPOSABLE) ×3 IMPLANT
VALVE REPLACEMENT CAP (MISCELLANEOUS) IMPLANT
WATER STERILE IRR 1000ML POUR (IV SOLUTION) ×3 IMPLANT

## 2017-07-26 NOTE — Brief Op Note (Signed)
07/26/2017  3:58 PM  PATIENT:  [name]  84 y.o. male  PRE-OPERATIVE DIAGNOSIS:  RIGHT PLEURAL EFFUSION  POST-OPERATIVE DIAGNOSIS:  RIGHT PLEURAL EFFUSION  PROCEDURE:  Procedure(s): INSERTION PLEURAL DRAINAGE CATHETER - RIGHT (Right) Drainage of 2 liters R pleural effusion  SURGEON:  Surgeon(s) and Role:    Ivin Poot, MD - Primary  PHYSICIAN ASSISTANT:   ASSISTANTS: none   ANESTHESIA:   local and IV sedation  EBL:  64m  BLOOD ADMINISTERED:none  DRAINS: pleurx catheter   LOCAL MEDICATIONS USED:  LIDOCAINE  and Amount: 5 ml  SPECIMEN:  Aspirate  DISPOSITION OF SPECIMEN:  PATHOLOGY  COUNTS:  YES  TOURNIQUET:  * No tourniquets in log *  DICTATION: .Dragon Dictation  PLAN OF CARE: Discharge to home after PACU  PATIENT DISPOSITION:  PACU - hemodynamically stable.   Delay start of Pharmacological VTE agent (>24hrs) due to surgical blood loss or risk of bleeding: yes

## 2017-07-26 NOTE — Op Note (Deleted)
  The note originally documented on this encounter has been moved the the encounter in which it belongs.

## 2017-07-26 NOTE — Anesthesia Preprocedure Evaluation (Signed)
Anesthesia Evaluation  Patient identified by MRN, date of birth, ID band Patient awake    Reviewed: Allergy & Precautions, NPO status , Patient's Chart, lab work & pertinent test results  Airway Mallampati: I  TM Distance: >3 FB Neck ROM: Full    Dental  (+) Teeth Intact, Dental Advisory Given   Pulmonary asthma ,    breath sounds clear to auscultation       Cardiovascular hypertension, Pt. on home beta blockers + CAD, + CABG and +CHF  + dysrhythmias Atrial Fibrillation + Cardiac Defibrillator  Rhythm:Regular Rate:Normal  S/p CABG, AVR   Neuro/Psych negative neurological ROS     GI/Hepatic hiatal hernia, GERD  Medicated,(+) Hepatitis -  Endo/Other  diabetes  Renal/GU CRFRenal disease     Musculoskeletal   Abdominal Normal abdominal exam  (+)   Peds  Hematology   Anesthesia Other Findings   Reproductive/Obstetrics                             Anesthesia Physical Anesthesia Plan  ASA: III  Anesthesia Plan: MAC   Post-op Pain Management:    Induction:   PONV Risk Score and Plan: 1 and Ondansetron  Airway Management Planned: Nasal Cannula  Additional Equipment: None  Intra-op Plan:   Post-operative Plan:   Informed Consent: I have reviewed the patients History and Physical, chart, labs and discussed the procedure including the risks, benefits and alternatives for the proposed anesthesia with the patient or authorized representative who has indicated his/her understanding and acceptance.   Dental advisory given  Plan Discussed with: Surgeon and CRNA  Anesthesia Plan Comments:         Anesthesia Quick Evaluation

## 2017-07-26 NOTE — Care Management (Signed)
ED CM received call from PACU concerning [place] recommendation for Pleurx Cath management. CM spoke with patient over phone with [facility] PACU nurse, patient is agreeable with care transitioning plans for [facility] services.  Offered choice patient selected [facility] who has provided hh services in the past.  CM verified contact information noted [place] orders with Face to Face start of care 07/29/17  M/W/F. CM faxed referral to [facility] via Epic CHL. CM completed PleurX drainage kit order form and faxed to [employer] at [phone]. LM to send  ED CM e-mail once received. Patient made aware that thru PACU RN that kits will be delivered to patient's home.  ED CM has been contacted by Butch Penny at [facility] concerning this [facility] referral.  No further ED CM needs identified.

## 2017-07-26 NOTE — Progress Notes (Signed)
Pre Procedure note for inpatients:   [name] has been scheduled for Procedure(s): INSERTION PLEURAL DRAINAGE CATHETER (Right) today. The various methods of treatment have been discussed with the patient. After consideration of the risks, benefits and treatment options the patient has consented to the planned procedure.   The patient has been seen and labs reviewed. There are no changes in the patient's condition to prevent proceeding with the planned procedure today.  Recent labs:  Lab Results  Component Value Date   WBC 7.8 07/26/2017   HGB 11.4 (L) 07/26/2017   HCT 35.7 (L) 07/26/2017   PLT 307 07/26/2017   GLUCOSE 100 (H) 07/26/2017   CHOL 126 07/30/2016   TRIG 154 (H) 07/30/2016   HDL 45 07/30/2016   LDLCALC 50 07/30/2016   ALT 44 07/26/2017   AST 32 07/26/2017   NA 136 07/26/2017   K 3.9 07/26/2017   CL 105 07/26/2017   CREATININE 1.26 (H) 07/26/2017   BUN 18 07/26/2017   CO2 19 (L) 07/26/2017   TSH <0.010 (L) 05/26/2017   INR 1.55 07/26/2017   HGBA1C 6.3 (H) 05/08/2017    Len Childs, MD 07/26/2017 2:58 PM

## 2017-07-26 NOTE — Anesthesia Postprocedure Evaluation (Signed)
Anesthesia Post Note  Patient: [name]  Procedure(s) Performed: INSERTION PLEURAL DRAINAGE CATHETER - RIGHT (Right Chest)     Patient location during evaluation: PACU Anesthesia Type: MAC Level of consciousness: awake and alert Pain management: pain level controlled Vital Signs Assessment: post-procedure vital signs reviewed and stable Respiratory status: spontaneous breathing, nonlabored ventilation, respiratory function stable and patient connected to nasal cannula oxygen Cardiovascular status: stable and blood pressure returned to baseline Postop Assessment: no apparent nausea or vomiting Anesthetic complications: no    Last Vitals:  Vitals:   07/26/17 1630 07/26/17 1645  BP: (!) 145/79 129/65  Pulse: 70 63  Resp: (!) 25 19  Temp:  36.4 C  SpO2: 97% 96%    Last Pain:  Vitals:   07/26/17 1645  PainSc: 0-No pain                 Purcell Jungbluth

## 2017-07-26 NOTE — Transfer of Care (Signed)
Immediate Anesthesia Transfer of Care Note  Patient: [name]  Procedure(s) Performed: INSERTION PLEURAL DRAINAGE CATHETER - RIGHT (Right Chest)  Patient Location: PACU  Anesthesia Type:MAC  Level of Consciousness: awake and patient cooperative  Airway & Oxygen Therapy: Patient Spontanous Breathing  Post-op Assessment: Report given to RN and Post -op Vital signs reviewed and stable  Post vital signs: Reviewed and stable  Last Vitals:  Vitals Value Taken Time  BP 132/62 07/26/2017  3:57 PM  Temp    Pulse 64 07/26/2017  3:57 PM  Resp 18 07/26/2017  3:57 PM  SpO2 97 % 07/26/2017  3:57 PM  Vitals shown include unvalidated device data.  Last Pain:  Vitals:   07/26/17 1320  PainSc: 0-No pain      Patients Stated Pain Goal: 0 (33/83/29 1916)  Complications: No apparent anesthesia complications

## 2017-07-26 NOTE — Anesthesia Procedure Notes (Signed)
Procedure Name: MAC Date/Time: 07/26/2017 3:08 PM Performed by: Lance Coon, CRNA Pre-anesthesia Checklist: Patient identified, Emergency Drugs available, Suction available, Patient being monitored and Timeout performed Patient Re-evaluated:Patient Re-evaluated prior to induction Oxygen Delivery Method: Nasal cannula

## 2017-07-26 NOTE — Discharge Instructions (Signed)
Shower in 24 hrs Resume all meds- resume eliquis April 20

## 2017-07-26 NOTE — Op Note (Signed)
NAMEREINHOLD, RICKEY NO.:  [id number]  MEDICAL RECORD NO.:  [id number]  LOCATION:                               FACILITY:  [facility]  PHYSICIAN:  Ivin Poot, M.D.  DATE OF BIRTH:  [birth year]  DATE OF PROCEDURE:  07/26/2017 DATE OF DISCHARGE:                              OPERATIVE REPORT   OPERATION:  Placement of right PleurX catheter.  SURGEON:  Ivin Poot, M.D.  ANESTHESIA:  1% local lidocaine with monitored IV conscious sedation by Anesthesia.  PREOPERATIVE DIAGNOSIS:  Recurrent right pleural effusion from chronic heart failure.  POSTOPERATIVE DIAGNOSES:  Recurrent right pleural effusion from chronic heart failure.  DESCRIPTION OF PROCEDURE:  The patient was seen in consultation in the office and prepared for surgery today.  He was re-evaluated in the preop holding area and the proper site marked and procedure discussed and questions answered.  The patient was brought back to the operating room and placed supine on the operating table.  He was monitored by Anesthesia.  The right chest was prepped and draped as a sterile field. A proper time-out was performed.  Two small incisions were made in the right chest.  The first was at the anterior axillary line at the 6th interspace where lidocaine was infiltrated.  The second incision was at the costal margin at the midclavicular line.  This was for the exit site of the catheter.  Through the upper incision, a guidewire was placed in the pleural space using the Seldinger technique and confirmed with C-arm fluoroscopy.  Next, the PleurX catheter was tunneled from the lower incision to the upper incision.  Over the guidewire, a dilator, then the dilator sheath were placed in the pleural space.  Pleural fluid returned vigorously.  Through the tear- away sheath, the PleurX catheter was fed into the basal part of the right pleural space.  The sheath was removed.  The catheter had a good lie according  to the C-arm fluoroscopy and it was connected to a suction bottle and drained 2 L, two 1-liter bottles.  The upper incision was closed in layers using a subcutaneous Vicryl and interrupted nylon skin sutures.  The lower exit incision was closed with a silk suture which was also used to secure the PleurX through the skin. After the drainage procedure, the PleurX catheter was capped and a sterile dressing was placed.  The patient returned to the recovery room where chest x-ray showed the catheter to be in good position with most of the fluid drained from his right pleural space.     Ivin Poot, M.D.   ______________________________ Ivin Poot, M.D.    PV/MEDQ  D:  07/26/2017  T:  07/26/2017  Job:  [id number]  cc:   Shaune Pascal. Bensimhon, MD

## 2017-07-27 ENCOUNTER — Encounter (HOSPITAL_COMMUNITY): Payer: Self-pay | Admitting: Cardiothoracic Surgery

## 2017-07-29 ENCOUNTER — Telehealth: Payer: Self-pay

## 2017-07-29 DIAGNOSIS — I5042 Chronic combined systolic (congestive) and diastolic (congestive) heart failure: Secondary | ICD-10-CM | POA: Diagnosis not present

## 2017-07-29 DIAGNOSIS — Z48812 Encounter for surgical aftercare following surgery on the circulatory system: Secondary | ICD-10-CM | POA: Diagnosis not present

## 2017-07-29 DIAGNOSIS — I255 Ischemic cardiomyopathy: Secondary | ICD-10-CM | POA: Diagnosis not present

## 2017-07-29 DIAGNOSIS — I130 Hypertensive heart and chronic kidney disease with heart failure and stage 1 through stage 4 chronic kidney disease, or unspecified chronic kidney disease: Secondary | ICD-10-CM | POA: Diagnosis not present

## 2017-07-29 DIAGNOSIS — J449 Chronic obstructive pulmonary disease, unspecified: Secondary | ICD-10-CM | POA: Diagnosis not present

## 2017-07-29 DIAGNOSIS — J90 Pleural effusion, not elsewhere classified: Secondary | ICD-10-CM | POA: Diagnosis not present

## 2017-07-29 DIAGNOSIS — N183 Chronic kidney disease, stage 3 (moderate): Secondary | ICD-10-CM | POA: Diagnosis not present

## 2017-07-29 DIAGNOSIS — I481 Persistent atrial fibrillation: Secondary | ICD-10-CM | POA: Diagnosis not present

## 2017-07-29 DIAGNOSIS — Z951 Presence of aortocoronary bypass graft: Secondary | ICD-10-CM | POA: Diagnosis not present

## 2017-07-29 DIAGNOSIS — Z4682 Encounter for fitting and adjustment of non-vascular catheter: Secondary | ICD-10-CM | POA: Diagnosis not present

## 2017-07-29 NOTE — Telephone Encounter (Signed)
Spoke with pt regarding ATP episode from 07/18/17 pt reports compliance with medications and denied any syncope episodes. Informed pt that episode would be reviewed with WC and would call him back with any recommendations, pt voiced understanding.

## 2017-07-31 DIAGNOSIS — Z4682 Encounter for fitting and adjustment of non-vascular catheter: Secondary | ICD-10-CM | POA: Diagnosis not present

## 2017-07-31 DIAGNOSIS — I130 Hypertensive heart and chronic kidney disease with heart failure and stage 1 through stage 4 chronic kidney disease, or unspecified chronic kidney disease: Secondary | ICD-10-CM | POA: Diagnosis not present

## 2017-07-31 DIAGNOSIS — I5042 Chronic combined systolic (congestive) and diastolic (congestive) heart failure: Secondary | ICD-10-CM | POA: Diagnosis not present

## 2017-07-31 DIAGNOSIS — I481 Persistent atrial fibrillation: Secondary | ICD-10-CM | POA: Diagnosis not present

## 2017-07-31 DIAGNOSIS — Z951 Presence of aortocoronary bypass graft: Secondary | ICD-10-CM | POA: Diagnosis not present

## 2017-07-31 DIAGNOSIS — J449 Chronic obstructive pulmonary disease, unspecified: Secondary | ICD-10-CM | POA: Diagnosis not present

## 2017-07-31 DIAGNOSIS — J90 Pleural effusion, not elsewhere classified: Secondary | ICD-10-CM | POA: Diagnosis not present

## 2017-07-31 DIAGNOSIS — I255 Ischemic cardiomyopathy: Secondary | ICD-10-CM | POA: Diagnosis not present

## 2017-07-31 DIAGNOSIS — N183 Chronic kidney disease, stage 3 (moderate): Secondary | ICD-10-CM | POA: Diagnosis not present

## 2017-07-31 NOTE — Telephone Encounter (Signed)
Written recommendations from Dr. Ileana Ladd to increase Mexilietine to 360m BID.  [facility]

## 2017-08-01 NOTE — Telephone Encounter (Signed)
Called patient to inform him of Dr.Camnitz's recommendation. Patient states that he's actually been taking Mexiletine 250 TID x ~ 71month. He said that HF clinic wanted to increase the medication to 3090mBID, but being that it doesn't come in 30066me was told to take the 250 TID. Instructed patient to continue taking what he's been taking. [facility] if anything further is recommended.

## 2017-08-02 ENCOUNTER — Telehealth: Payer: Self-pay

## 2017-08-02 ENCOUNTER — Other Ambulatory Visit: Payer: Self-pay | Admitting: Cardiology

## 2017-08-02 DIAGNOSIS — J90 Pleural effusion, not elsewhere classified: Secondary | ICD-10-CM | POA: Diagnosis not present

## 2017-08-02 DIAGNOSIS — Z4682 Encounter for fitting and adjustment of non-vascular catheter: Secondary | ICD-10-CM | POA: Diagnosis not present

## 2017-08-02 DIAGNOSIS — Z951 Presence of aortocoronary bypass graft: Secondary | ICD-10-CM | POA: Diagnosis not present

## 2017-08-02 DIAGNOSIS — N183 Chronic kidney disease, stage 3 (moderate): Secondary | ICD-10-CM | POA: Diagnosis not present

## 2017-08-02 DIAGNOSIS — I481 Persistent atrial fibrillation: Secondary | ICD-10-CM | POA: Diagnosis not present

## 2017-08-02 DIAGNOSIS — I130 Hypertensive heart and chronic kidney disease with heart failure and stage 1 through stage 4 chronic kidney disease, or unspecified chronic kidney disease: Secondary | ICD-10-CM | POA: Diagnosis not present

## 2017-08-02 DIAGNOSIS — I5042 Chronic combined systolic (congestive) and diastolic (congestive) heart failure: Secondary | ICD-10-CM | POA: Diagnosis not present

## 2017-08-02 DIAGNOSIS — J449 Chronic obstructive pulmonary disease, unspecified: Secondary | ICD-10-CM | POA: Diagnosis not present

## 2017-08-02 DIAGNOSIS — I255 Ischemic cardiomyopathy: Secondary | ICD-10-CM | POA: Diagnosis not present

## 2017-08-02 NOTE — Telephone Encounter (Signed)
PleurX drained 200 cc's, then 75 cc's and today less than 15 cc's She will continue to drain every other day and will call back if the amounts remain less than 15 cc's

## 2017-08-02 NOTE — Telephone Encounter (Signed)
Unable to reach for ICM follow up.

## 2017-08-05 ENCOUNTER — Other Ambulatory Visit: Payer: Self-pay | Admitting: Cardiology

## 2017-08-05 ENCOUNTER — Telehealth: Payer: Self-pay

## 2017-08-05 ENCOUNTER — Telehealth: Payer: Self-pay | Admitting: Cardiology

## 2017-08-05 DIAGNOSIS — I255 Ischemic cardiomyopathy: Secondary | ICD-10-CM | POA: Diagnosis not present

## 2017-08-05 DIAGNOSIS — Z4682 Encounter for fitting and adjustment of non-vascular catheter: Secondary | ICD-10-CM | POA: Diagnosis not present

## 2017-08-05 DIAGNOSIS — N183 Chronic kidney disease, stage 3 (moderate): Secondary | ICD-10-CM | POA: Diagnosis not present

## 2017-08-05 DIAGNOSIS — I5042 Chronic combined systolic (congestive) and diastolic (congestive) heart failure: Secondary | ICD-10-CM | POA: Diagnosis not present

## 2017-08-05 DIAGNOSIS — I481 Persistent atrial fibrillation: Secondary | ICD-10-CM | POA: Diagnosis not present

## 2017-08-05 DIAGNOSIS — Z951 Presence of aortocoronary bypass graft: Secondary | ICD-10-CM | POA: Diagnosis not present

## 2017-08-05 DIAGNOSIS — I130 Hypertensive heart and chronic kidney disease with heart failure and stage 1 through stage 4 chronic kidney disease, or unspecified chronic kidney disease: Secondary | ICD-10-CM | POA: Diagnosis not present

## 2017-08-05 DIAGNOSIS — J449 Chronic obstructive pulmonary disease, unspecified: Secondary | ICD-10-CM | POA: Diagnosis not present

## 2017-08-05 DIAGNOSIS — J90 Pleural effusion, not elsewhere classified: Secondary | ICD-10-CM | POA: Diagnosis not present

## 2017-08-05 MED ORDER — FUROSEMIDE 40 MG PO TABS: 40.0000 mg | ORAL_TABLET | Freq: Two times a day (BID) | ORAL | 0 refills | Status: DC

## 2017-08-05 MED ORDER — MEXILETINE HCL 150 MG PO CAPS: 300.0000 mg | ORAL_CAPSULE | Freq: Three times a day (TID) | ORAL | 11 refills | Status: DC

## 2017-08-05 NOTE — Telephone Encounter (Signed)
[name] from [place] states patient needs refill on lasix. Patient was admitted on 07/26/17 with recurrent pleural effusion, pt on lasix 40 mg BID. Creatinine on 07/26/17 was 1.26 BUN 18. Patient has a f/u with Dr. Radford Pax on 09/23/17. She verbalized understanding and thankful for the call.

## 2017-08-05 NOTE — Telephone Encounter (Signed)
See other phone note from today in regards to same medication.

## 2017-08-05 NOTE — Telephone Encounter (Signed)
Spoke with [name] and let him know of Dr. Macky Lower recommendations to increase his Mexiletine to 389m TID. I let him know that he will have to take two 1516mcapsules three times daily. He verbalizes understanding and I verify his preferred pharmacy.

## 2017-08-05 NOTE — Telephone Encounter (Signed)
Will forward to Dr. Radford Pax. See phone note from today

## 2017-08-05 NOTE — Telephone Encounter (Signed)
Per Dr. Curt Bears - increase Mexiletine to 300 mg TID. Device clinic will advise/discuss w/ patient.

## 2017-08-05 NOTE — Addendum Note (Signed)
Addended by: Ranee Gosselin on: 08/05/2017 02:39 PM   Modules accepted: Orders

## 2017-08-05 NOTE — Telephone Encounter (Signed)
New Message:        *STAT* If patient is at the pharmacy, call can be transferred to refill team.   1. Which medications need to be refilled? (please list name of each medication and dose if known) furosemide (LASIX) 40 MG tablet  2. Which pharmacy/location (including street and city if local pharmacy) is medication to be sent to?[place], [place]  3. Do they need a 30 day or 90 day supply? [place]

## 2017-08-05 NOTE — Telephone Encounter (Signed)
New message:      Pt c/o medication issue:  1. Name of Medication: furosemide (LASIX) 40 MG tablet  2. How are you currently taking this medication (dosage and times per day)? Take 1 tablet (40 mg total) by mouth 2 (two) times daily.  3. Are you having a reaction (difficulty breathing--STAT)? No  4. What is your medication issue? Morey Hummingbird states that pt is running out of this medication due to the change in the way he takes this medication. Pt has not refills left and Morey Hummingbird wanted to know if we have sent some over to [facility] for this pt.     *I have also sent over a refill request for this pt.

## 2017-08-06 NOTE — Telephone Encounter (Signed)
Refill sent to pharmacy per Dr. Radford Pax

## 2017-08-08 ENCOUNTER — Other Ambulatory Visit: Payer: Self-pay | Admitting: *Deleted

## 2017-08-08 ENCOUNTER — Other Ambulatory Visit: Payer: Self-pay | Admitting: Cardiology

## 2017-08-08 ENCOUNTER — Encounter: Payer: Self-pay | Admitting: *Deleted

## 2017-08-08 NOTE — Telephone Encounter (Signed)
Pt is a 84 yr old male who saw Dr Curt Bears on 06/24/17. His weight was 79.6Kg on 06/24/17. Serum creatine noted on 07/26/17 was 1.26. Will refill Eliquis 65m BID.

## 2017-08-08 NOTE — Patient Outreach (Signed)
[facility]) Care Management   08/08/2017  [name] 1932/07/12 [id number]  [name] is an 84 y.o. male  Subjective: Routine home visit with pt, HIPAA verified, pt reports he now has pleurex catheter and no further complications of pleural effusion, pt states home health RN continues working with him, has all medications and taking as prescribed, continues weighing daily, pt states he is getting outside more and working in garden.  Objective:   Vitals:   08/08/17 1147  BP: 104/66  Pulse: 81  Resp: 20  SpO2: 98%  Weight: 169 lb (76.7 kg)   ROS  Physical Exam  Constitutional: He is oriented to person, place, and time. He appears well-developed.  HENT:  Head: Normocephalic.  Neck: Normal range of motion. Neck supple.  Cardiovascular: Normal rate.  Irregular rhythm  Respiratory: Effort normal and breath sounds normal.  GI: Soft. Bowel sounds are normal.  Musculoskeletal: Normal range of motion. He exhibits no edema.  Neurological: He is oriented to person, place, and time.  Skin: Skin is warm and dry.  Psychiatric: He has a normal mood and affect. His behavior is normal. Judgment and thought content normal.    Encounter Medications:   Outpatient Encounter Medications as of 08/08/2017  Medication Sig Note  . acetaminophen (TYLENOL) 500 MG tablet Take 1,000 mg by mouth daily as needed for headache (pain).    Marland Kitchen albuterol (PROVENTIL HFA;VENTOLIN HFA) 108 (90 Base) MCG/ACT inhaler Inhale 1-2 puffs into the lungs every 6 (six) hours as needed for wheezing.    . Amino Acids-Protein Hydrolys (FEEDING SUPPLEMENT, PRO-STAT SUGAR FREE 64,) LIQD Take 30 mLs by mouth daily.   Marland Kitchen atorvastatin (LIPITOR) 20 MG tablet TAKE 1 TABLET (20 MG TOTAL) DAILY AT 6 PM.   . carvedilol (COREG) 12.5 MG tablet Take 6.25 mg by mouth 2 (two) times daily.    Marland Kitchen ELIQUIS 2.5 MG TABS tablet TAKE 1 TABLET TWICE DAILY   . feeding supplement, ENSURE ENLIVE, (ENSURE ENLIVE) LIQD Take 237  mLs by mouth 2 (two) times daily between meals. 06/15/2017: See entry for Boost  . furosemide (LASIX) 40 MG tablet Take 1 tablet (40 mg total) by mouth 2 (two) times daily.   . hydrALAZINE (APRESOLINE) 25 MG tablet Take 12.5 mg by mouth 2 (two) times daily.   . isosorbide mononitrate (IMDUR) 30 MG 24 hr tablet TAKE 1 TABLET (30 MG TOTAL) BY MOUTH DAILY.   Marland Kitchen lactose free nutrition (BOOST) LIQD Take 237 mLs by mouth 3 (three) times daily as needed (meal supplement).   . methimazole (TAPAZOLE) 10 MG tablet Take 1 tablet (10 mg total) by mouth 2 (two) times daily.   Marland Kitchen mexiletine (MEXITIL) 150 MG capsule Take 2 capsules (300 mg total) by mouth 3 (three) times daily.   . Multiple Vitamin (MULTIVITAMIN WITH MINERALS) TABS tablet Take 1 tablet by mouth daily.    . Omega-3 Fatty Acids (FISH OIL) 1000 MG CAPS Take 2,000 mg by mouth 2 (two) times daily.   . pantoprazole (PROTONIX) 40 MG tablet Take 1 tablet (40 mg total) by mouth daily before breakfast.   . potassium chloride SA (K-DUR,KLOR-CON) 20 MEQ tablet Take 1 tablet (20 mEq total) by mouth daily.   . predniSONE (DELTASONE) 10 MG tablet Take 2 tablets (20 mg total) by mouth daily with breakfast. 06/15/2017: #60 filled 05/28/17 / 3 refills  . ranitidine (ZANTAC) 300 MG tablet Take 300 mg by mouth at bedtime.   . benzonatate (TESSALON) 200 MG  capsule Take 1 capsule (200 mg total) by mouth 2 (two) times daily as needed for cough. (Patient not taking: Reported on 07/22/2017)   . mirtazapine (REMERON) 15 MG tablet Take 1 tablet (15 mg total) by mouth at bedtime. (Patient not taking: Reported on 08/08/2017)   . traMADol (ULTRAM) 50 MG tablet Take 1 tablet (50 mg total) by mouth every 12 (twelve) hours as needed for moderate pain or severe pain. (Patient not taking: Reported on 08/08/2017)    No facility-administered encounter medications on file as of 08/08/2017.     Functional Status:   In your present state of health, do you have any difficulty performing the  following activities: 07/26/2017 06/16/2017  Hearing? N N  Comment - -  Vision? N N  Difficulty concentrating or making decisions? N N  Comment - -  Walking or climbing stairs? N Y  Dressing or bathing? N N  Doing errands, shopping? - Y  Preparing Food and eating ? - -  Using the Toilet? - -  In the past six months, have you accidently leaked urine? - -  Do you have problems with loss of bowel control? - -  Managing your Medications? - -  Managing your Finances? - -  Housekeeping or managing your Housekeeping? - -  Some recent data might be hidden    Fall/Depression Screening:    Fall Risk  07/10/2017 06/12/2017 05/09/2015  Falls in the past year? No No No  Risk for fall due to : Medication side effect;Impaired balance/gait Medication side effect;Impaired balance/gait -   PHQ 2/9 Scores 06/12/2017 12/27/2015 05/09/2015  PHQ - 2 Score 1 0 0    Assessment:  Pleurex catheter to right side with dry dressing intact.  Pt progressing through plan of care, attending all appointments, taking medications as prescribed, RN CM discussed discharge plan with pt and will see pt for home visit next month for reinforcement.  Pt appears to have increased endurance and participating in more activities,  [facility] CM Care Plan Problem One     Most Recent Value  Care Plan Problem One  Knowledge deficit related to CHF  Role Documenting the Problem One  Care Management [place] for Problem One  Active  [facility] Long Term Goal   Pt will verbalize/ demonstrate better understanding self care for CHF within 60 days  [facility] Long Term Goal Start Date  08/08/17  Interventions for Problem One Long Term Goal  RN CM reviewed importance of continued daily weights, observed and reviewed all medication bottles with pt, pt has been able to manage fluid more effectively, has pleurex catheter  [facility] CM Short Term Goal #1   Pt will verbalize CHF zones / action plan within 30 days  [facility] CM Short Term Goal #1 Start Date  07/10/17   [facility] CM Short Term Goal #1 Met Date  08/08/17 [goal re-established]  Interventions for Short Term Goal #1  RN CM reviewed CHF action plan/ zones, pt is weighing daily and recording  [facility] CM Short Term Goal #2   Pt will weigh daily and record within 30 days  [facility] CM Short Term Goal #2 Start Date  06/06/17  [facility] CM Short Term Goal #2 Met Date  07/10/17    [facility] CM Care Plan Problem Two     Most Recent Value  Care Plan Problem Two  Pt has decreased endurance  Role Documenting the Problem Two  Care Management Coordinator  Care Plan for Problem Two  Active  [facility] CM Short Term Goal #1   Pt will work with home health PT and complete prescribed exercises, verbalize increase endurance within 30 days  [facility] CM Short Term Goal #1 Start Date  07/10/17 [goal restarted]  [facility] CM Short Term Goal #1 Met Date   08/08/17  Interventions for Short Term Goal #2   Pt worked with home health PT and is now completing prescribed exercises daily      Plan: see pt for home visit next month  Jacqlyn Larsen [facility], [place] Coordinator [phone]

## 2017-08-09 DIAGNOSIS — I255 Ischemic cardiomyopathy: Secondary | ICD-10-CM | POA: Diagnosis not present

## 2017-08-09 DIAGNOSIS — Z951 Presence of aortocoronary bypass graft: Secondary | ICD-10-CM | POA: Diagnosis not present

## 2017-08-09 DIAGNOSIS — I130 Hypertensive heart and chronic kidney disease with heart failure and stage 1 through stage 4 chronic kidney disease, or unspecified chronic kidney disease: Secondary | ICD-10-CM | POA: Diagnosis not present

## 2017-08-09 DIAGNOSIS — N183 Chronic kidney disease, stage 3 (moderate): Secondary | ICD-10-CM | POA: Diagnosis not present

## 2017-08-09 DIAGNOSIS — J90 Pleural effusion, not elsewhere classified: Secondary | ICD-10-CM | POA: Diagnosis not present

## 2017-08-09 DIAGNOSIS — J449 Chronic obstructive pulmonary disease, unspecified: Secondary | ICD-10-CM | POA: Diagnosis not present

## 2017-08-09 DIAGNOSIS — I5042 Chronic combined systolic (congestive) and diastolic (congestive) heart failure: Secondary | ICD-10-CM | POA: Diagnosis not present

## 2017-08-09 DIAGNOSIS — Z4682 Encounter for fitting and adjustment of non-vascular catheter: Secondary | ICD-10-CM | POA: Diagnosis not present

## 2017-08-09 DIAGNOSIS — I481 Persistent atrial fibrillation: Secondary | ICD-10-CM | POA: Diagnosis not present

## 2017-08-12 ENCOUNTER — Telehealth: Payer: Self-pay

## 2017-08-12 ENCOUNTER — Ambulatory Visit (INDEPENDENT_AMBULATORY_CARE_PROVIDER_SITE_OTHER): Payer: Medicare HMO | Admitting: *Deleted

## 2017-08-12 DIAGNOSIS — J90 Pleural effusion, not elsewhere classified: Secondary | ICD-10-CM | POA: Diagnosis not present

## 2017-08-12 DIAGNOSIS — I130 Hypertensive heart and chronic kidney disease with heart failure and stage 1 through stage 4 chronic kidney disease, or unspecified chronic kidney disease: Secondary | ICD-10-CM | POA: Diagnosis not present

## 2017-08-12 DIAGNOSIS — I5042 Chronic combined systolic (congestive) and diastolic (congestive) heart failure: Secondary | ICD-10-CM | POA: Diagnosis not present

## 2017-08-12 DIAGNOSIS — Z4682 Encounter for fitting and adjustment of non-vascular catheter: Secondary | ICD-10-CM | POA: Diagnosis not present

## 2017-08-12 DIAGNOSIS — Z951 Presence of aortocoronary bypass graft: Secondary | ICD-10-CM | POA: Diagnosis not present

## 2017-08-12 DIAGNOSIS — I481 Persistent atrial fibrillation: Secondary | ICD-10-CM | POA: Diagnosis not present

## 2017-08-12 DIAGNOSIS — J449 Chronic obstructive pulmonary disease, unspecified: Secondary | ICD-10-CM | POA: Diagnosis not present

## 2017-08-12 DIAGNOSIS — I255 Ischemic cardiomyopathy: Secondary | ICD-10-CM | POA: Diagnosis not present

## 2017-08-12 DIAGNOSIS — N183 Chronic kidney disease, stage 3 (moderate): Secondary | ICD-10-CM | POA: Diagnosis not present

## 2017-08-12 NOTE — Progress Notes (Signed)
Remote ICD transmission.   

## 2017-08-12 NOTE — Telephone Encounter (Signed)
Spoke with patient regarding [place] episode on 4/30. Patient states that he has been feeling fine and recalls no symptoms. Patient states that he has not yet increased his mexiletine to 340m TID per 4/29 orders. I recommended that he make this change as soon as possible - patient will make the change at his evening dose this evening.

## 2017-08-13 ENCOUNTER — Ambulatory Visit: Payer: Medicare HMO | Admitting: Internal Medicine

## 2017-08-13 ENCOUNTER — Encounter: Payer: Self-pay | Admitting: Internal Medicine

## 2017-08-13 VITALS — BP 122/64 | HR 75 | Ht 72.0 in | Wt 176.6 lb

## 2017-08-13 DIAGNOSIS — E0590 Thyrotoxicosis, unspecified without thyrotoxic crisis or storm: Secondary | ICD-10-CM | POA: Diagnosis not present

## 2017-08-13 DIAGNOSIS — E042 Nontoxic multinodular goiter: Secondary | ICD-10-CM | POA: Diagnosis not present

## 2017-08-13 DIAGNOSIS — R7309 Other abnormal glucose: Secondary | ICD-10-CM

## 2017-08-13 HISTORY — DX: Nontoxic multinodular goiter: E04.2

## 2017-08-13 LAB — POCT GLYCOSYLATED HEMOGLOBIN (HGB A1C): HEMOGLOBIN A1C: 6.9

## 2017-08-13 LAB — T4, FREE: Free T4: 0.56 ng/dL — ABNORMAL LOW (ref 0.60–1.60)

## 2017-08-13 LAB — TSH: TSH: 16.53 u[IU]/mL — ABNORMAL HIGH (ref 0.35–4.50)

## 2017-08-13 LAB — T3, FREE: T3, Free: 2.2 pg/mL — ABNORMAL LOW (ref 2.3–4.2)

## 2017-08-13 NOTE — Progress Notes (Signed)
Patient ID: [name], male   DOB: 1932/08/06, 84 y.o.   MRN: [id number]    HPI  [name] is a 84 y.o.-year-old male, initially referred by Dr. Tana Coast, returning for follow-up for amiodarone-induced thyrotoxicosis.   Last visit 3 months ago.  Reviewed history: Patient has a history of CHF secondary to ischemic cardiomyopathy and atrial fibrillation. He was started on Amiodarone 08/2014 >> he was taken off 04/2017 2/2 suspected amiodarone-induced lung ds.  He was started on Mexitil instead.  On 05/08/2017, he saw his cardiologist and complained of weakness, shortness of breath, weight loss was directed to the emergency department.  TFTs were checked and they were thyrotoxic.  I was contacted over the phone by Dr. Tana Coast adjusted to start methimazole 5 mg twice a day.  He was discharged on 05/10/2017.    We started a series of investigative tests with the following results: - TSI antibodies slightly elevated (see below) - Tg was elevated (see below) - Thyroid ultrasound (05/22/2017): no increased blood flow, spongiform small nodules +a 1.4 cm cyst  Due to the possibility of a combination of overproduction and inflammatory amiodarone induced thyrotoxicosis, I suggested the following: - Increase methimazole to 10 mg twice a day - he ran out of this 2 days ago. - start Prednisone 20 mg daily  On this regimen, he started to feel much better. No SOB, palpitations, CP, and has improved tremors.  Reviewed patient's TFTs: Lab Results  Component Value Date   TSH <0.010 (L) 05/26/2017   TSH <0.01 (L) 05/14/2017   TSH <0.010 (L) 05/10/2017   TSH 1.630 01/28/2017   TSH 1.920 07/30/2016   TSH 1.27 07/28/2015   TSH 1.654 01/19/2015   TSH 1.85 10/14/2014   TSH 1.85 09/13/2014   TSH 1.95 09/07/2014   Lab Results  Component Value Date   FREET4 4.01 (H) 05/26/2017   FREET4 4.59 (H) 05/14/2017   FREET4 4.44 (H) 05/10/2017   Lab Results  Component Value Date   T3FREE 4.2 05/26/2017    T3FREE 5.8 (H) 05/14/2017   05/10/2017: total T3:  134 (71-180)  Thyroglobulin level was elevated: Lab Results  Component Value Date   THYROGLB 289.5 (H) 05/14/2017    His Graves' antibodies were only mildly elevated: Lab Results  Component Value Date   TSI 172 (H) 05/14/2017   Pt denies: - feeling nodules in neck - hoarseness - dysphagia - choking - SOB with lying down  At last visit, he complained of:  - Weight loss: 20 pounds in 2 weeks, resolving at the time of the last appointment >> now gained wt - fatigue >> now fatigue, now works in the garden - heat intolerance and sweating  >> none now  Pt does not have a FH of thyroid ds. No FH of thyroid cancer. No h/o radiation tx to head or neck.  No seaweed or kelp. No recent contrast studies. No herbal supplements. No Biotin use. No recent steroids use.   Pt. also has a history of A fib for >10 years, h/o Ao valve replacement, HL, GERD.  He continues to have recurrent pleural effusions -drained several times since last admission.  ROS: Constitutional: + weight gain/no weight loss, no fatigue, no subjective hyperthermia, no subjective hypothermia Eyes: no blurry vision, no xerophthalmia ENT: no sore throat, + see HPI Cardiovascular: no CP/no SOB/no palpitations/no leg swelling Respiratory: no cough/no SOB/no wheezing Gastrointestinal: no N/no V/no D/no C/no acid reflux Musculoskeletal: no muscle aches/no joint aches Skin: no  rashes, no hair loss Neurological: no tremors/no numbness/no tingling/no dizziness  I reviewed pt's medications, allergies, PMH, social hx, family hx, and changes were documented in the history of present illness. Otherwise, unchanged from my initial visit note.  Past Medical History:  Diagnosis Date  . AICD (automatic cardioverter/defibrillator) present   . Arthritis    "minor everywhere" (04/22/2015)  . Asthma    "a touch"  . Barrett esophagus   . CAD (coronary artery disease)    a. s/p 2  vessel CABG with LIMA to LAD, SVG to diagonal 1, SVG to RCA 12/09.  Marland Kitchen Chronic combined systolic and diastolic CHF (congestive heart failure) (HCC)    dry weight 197-200lbs.  . CKD (chronic kidney disease), stage III ([place])   . Dyspnea   . GERD (gastroesophageal reflux disease)   . H/O: rheumatic fever   . Hepatitis 1957   "don't know what kind:  . History of hiatal hernia   . History of PFTs    a. Amiodarone started 5/16 >> PFTs w/ DLCO 6/16:  FEV1 72% predicted, FEV1/FVC 66%, DLCO 66% >> minimal reversible obstructive airways disease with mild diffusion defect (suggestive of emphysema but absence of hyperinflation inconsistent with dx)  . HTN (hypertension)   . Hypercholesteremia   . NICM (nonischemic cardiomyopathy) ([place])    EF 15-20% by echo 2017  . Orthostatic hypotension   . PAF (paroxysmal atrial fibrillation) ([place])   . Pneumonia 08/2014   "dr thought I may have had a touch"  . Pre-diabetes   . S/P AVR (aortic valve replacement) 2009   a. severe AS s/p AVR with pericardial tissue valve 2009.  Marland Kitchen Ventricular tachycardia [facility])    Past Surgical History:  Procedure Laterality Date  . AORTIC VALVE REPLACEMENT  with 23-mm Magna Ease pericardial valve, model number   with 23-mm Magna Ease pericardial valve, model [id number], serial number [id number]   . APPENDECTOMY  1940s  . BI-VENTRICULAR IMPLANTABLE CARDIOVERTER DEFIBRILLATOR  (CRT-D)  04/22/2015  . CARDIAC CATHETERIZATION N/A 11/04/2014   Procedure: Left Heart Cath and Cors/Grafts Angiography;  Surgeon: Leonie Man, MD;  Location: [facility];  Service: Cardiovascular;  Laterality: N/A;  . CARDIAC VALVE REPLACEMENT    . CARDIOVERSION N/A 01/27/2013   Procedure: CARDIOVERSION;  Surgeon: Sueanne Margarita, MD;  Location: [facility];  Service: Cardiovascular;  Laterality: N/A;  . CARDIOVERSION N/A 08/25/2014   Procedure: CARDIOVERSION;  Surgeon: Sanda Klein, MD;  Location: [facility];  Service: Cardiovascular;   Laterality: N/A;  . CATARACT EXTRACTION W/ INTRAOCULAR LENS  IMPLANT, BILATERAL Bilateral   . CHEST TUBE INSERTION Right 07/26/2017   Procedure: INSERTION PLEURAL DRAINAGE CATHETER - RIGHT;  Surgeon: Ivin Poot, MD;  Location: [facility];  Service: Thoracic;  Laterality: Right;  . CORONARY ARTERY BYPASS GRAFT  03/10/2008   x 3 Dr. Roxan Hockey  . EP IMPLANTABLE DEVICE N/A 04/22/2015   Procedure: BiV ICD Insertion CRT-D;  Surgeon: [name], MD;  Location: [facility];  Service: Cardiovascular;  Laterality: N/A;  . ESOPHAGOGASTRODUODENOSCOPY (EGD) WITH ESOPHAGEAL DILATION  X1  . ESOPHAGOGASTRODUODENOSCOPY (EGD) WITH PROPOFOL N/A 05/09/2017   Procedure: ESOPHAGOGASTRODUODENOSCOPY (EGD) WITH PROPOFOL;  Surgeon: Carol Ada, MD;  Location: [facility];  Service: Endoscopy;  Laterality: N/A;  . IR THORACENTESIS ASP PLEURAL SPACE W/IMG GUIDE  05/27/2017  . IR THORACENTESIS ASP PLEURAL SPACE W/IMG GUIDE  06/17/2017  . IR THORACENTESIS ASP PLEURAL SPACE W/IMG GUIDE  06/18/2017  . IR THORACENTESIS ASP PLEURAL SPACE W/IMG  GUIDE  07/11/2017  . RIGHT HEART CATH AND CORONARY/GRAFT ANGIOGRAPHY N/A 05/31/2017   Procedure: RIGHT HEART CATH AND CORONARY/GRAFT ANGIOGRAPHY;  Surgeon: Jolaine Artist, MD;  Location: [facility];  Service: Cardiovascular;  Laterality: N/A;  . TONSILLECTOMY     Social History   Tobacco Use  . Smoking status: Never Smoker  . Smokeless tobacco: Never Used  Substance and Sexual Activity  . Alcohol use: Yes    Comment: 04/22/2015 "nothing since the 1980s; never had a problem w/it"  . Drug use: No  . Sexual activity: No  Other Topics Concern  . Not on file  Social History Narrative   Married, lives with spouse [name]   4 children, 2 boys and 2 girls > one daughter passed   OCCUPATION: retired, Pension scheme manager for [employer]   Current Outpatient Medications on File Prior to Visit  Medication Sig Dispense Refill  . acetaminophen (TYLENOL) 500  MG tablet Take 1,000 mg by mouth daily as needed for headache (pain).     Marland Kitchen albuterol (PROVENTIL HFA;VENTOLIN HFA) 108 (90 Base) MCG/ACT inhaler Inhale 1-2 puffs into the lungs every 6 (six) hours as needed for wheezing.     . Amino Acids-Protein Hydrolys (FEEDING SUPPLEMENT, PRO-STAT SUGAR FREE 64,) LIQD Take 30 mLs by mouth daily. 900 mL 0  . atorvastatin (LIPITOR) 20 MG tablet TAKE 1 TABLET (20 MG TOTAL) DAILY AT 6 PM. 90 tablet 3  . benzonatate (TESSALON) 200 MG capsule Take 1 capsule (200 mg total) by mouth 2 (two) times daily as needed for cough. (Patient not taking: Reported on 07/22/2017) 20 capsule 0  . carvedilol (COREG) 12.5 MG tablet Take 6.25 mg by mouth 2 (two) times daily.     Marland Kitchen ELIQUIS 2.5 MG TABS tablet TAKE 1 TABLET TWICE DAILY 180 tablet 0  . feeding supplement, ENSURE ENLIVE, (ENSURE ENLIVE) LIQD Take 237 mLs by mouth 2 (two) times daily between meals. 60 Bottle 0  . furosemide (LASIX) 40 MG tablet Take 1 tablet (40 mg total) by mouth 2 (two) times daily. 180 tablet 0  . hydrALAZINE (APRESOLINE) 25 MG tablet Take 12.5 mg by mouth 2 (two) times daily.    . isosorbide mononitrate (IMDUR) 30 MG 24 hr tablet TAKE 1 TABLET (30 MG TOTAL) BY MOUTH DAILY. 90 tablet 3  . lactose free nutrition (BOOST) LIQD Take 237 mLs by mouth 3 (three) times daily as needed (meal supplement).    . methimazole (TAPAZOLE) 10 MG tablet Take 1 tablet (10 mg total) by mouth 2 (two) times daily. 180 tablet 0  . mexiletine (MEXITIL) 150 MG capsule Take 2 capsules (300 mg total) by mouth 3 (three) times daily. 180 capsule 11  . mirtazapine (REMERON) 15 MG tablet Take 1 tablet (15 mg total) by mouth at bedtime. (Patient not taking: Reported on 08/08/2017) 30 tablet 0  . Multiple Vitamin (MULTIVITAMIN WITH MINERALS) TABS tablet Take 1 tablet by mouth daily.     . Omega-3 Fatty Acids (FISH OIL) 1000 MG CAPS Take 2,000 mg by mouth 2 (two) times daily.    . pantoprazole (PROTONIX) 40 MG tablet Take 1 tablet (40 mg  total) by mouth daily before breakfast. 30 tablet 3  . potassium chloride SA (K-DUR,KLOR-CON) 20 MEQ tablet Take 1 tablet (20 mEq total) by mouth daily. 30 tablet 3  . predniSONE (DELTASONE) 10 MG tablet Take 2 tablets (20 mg total) by mouth daily with breakfast. 60 tablet 3  . ranitidine (ZANTAC) 300 MG tablet  Take 300 mg by mouth at bedtime.    . traMADol (ULTRAM) 50 MG tablet Take 1 tablet (50 mg total) by mouth every 12 (twelve) hours as needed for moderate pain or severe pain. (Patient not taking: Reported on 08/08/2017) 20 tablet 0   No current facility-administered medications on file prior to visit.    Allergies  Allergen Reactions  . Novocain [Procaine] Swelling  . Amiodarone Nausea And Vomiting   Family History  Problem Relation Age of Onset  . Alzheimer's disease Mother   . COPD Daughter   . Alzheimer's disease Unknown   . COPD Sister   . Depression Sister   . Heart disease Sister   . Hyperlipidemia Sister   . Hypertension Sister   . Esophageal cancer Sister        + smoker    PE: BP 122/64   Pulse 75   Ht 6' (1.829 m)   Wt 176 lb 9.6 oz (80.1 kg)   SpO2 99%   BMI 23.95 kg/m  Wt Readings from Last 3 Encounters:  08/13/17 176 lb 9.6 oz (80.1 kg)  08/08/17 169 lb (76.7 kg)  07/26/17 177 lb (80.3 kg)   Constitutional: overweight, in NAD Eyes: PERRLA, EOMI, no exophthalmos ENT: moist mucous membranes, no thyromegaly, no cervical lymphadenopathy Cardiovascular: RRR, No MRG Respiratory: CTA B Gastrointestinal: abdomen soft, NT, ND, BS+ Musculoskeletal: no deformities, strength intact in all 4 Skin: moist, warm, no rashes Neurological: no tremor with outstretched hands, DTR normal in all 4  ASSESSMENT: 1.  Amiodarone-induced thyrotoxicosis  2. Thyroid nodules Thyroid ultrasound (05/22/2017       FINDINGS: Parenchymal Echotexture: Moderately heterogenous Isthmus: 0.6 cm Right lobe: 3.8 cm x 1.9 cm x 1.7 cm Left lobe: 4.4 cm x 2.0 cm x 1.9  cm ______________________________________________  Nodule # 1: Location: Right; Inferior Maximum size: 0.8 cm; Other 2 dimensions: 0.5 cm x 0.8 cm Composition: spongiform (0) ACR TI-RADS recommendations: Spongiform nodule does not meet criteria for surveillance or biopsy _______________________________________________________  Nodule # 2: Location: Left; Superior Maximum size: 0.9 cm; Other 2 dimensions: 0.8 cm x 0.8 cm Composition: spongiform (0) ACR TI-RADS recommendations: Spongiform nodule does not meet criteria for surveillance or biopsy _________________________________________________  Nodule # 3: Location: Left; Mid Maximum size: 0.9 cm; Other 2 dimensions: 0.7 cm x 0.8 cm Composition: spongiform (0) ACR TI-RADS recommendations: Spongiform nodule does not meet criteria for surveillance or biopsy _________________________________________________________  Nodule # 4: Location: Left; Inferior Maximum size: 1.4 cm; Other 2 dimensions: 41.1 cm x 1.1 cm Composition: cystic/almost completely cystic (0) Echogenicity: anechoic (0) Cystic nodule does not meet criteria for surveillance or biopsy  _________________________________________________________  Color flow demonstrates patchy regions of internal flow, not significantly increased within the left or right thyroid.  No adenopathy.  IMPRESSION: No thyroid nodule meets criteria for biopsy or surveillance, as designated by the newly established ACR TI-RADS criteria. Patchy regions of internal color flow of the thyroid parenchyma, not significantly increased.    3. Elevated HbA1c  PLAN:   1.  Patient with recent history of thyrotoxicosis, initially with thyrotoxic symptoms: Dramatic weight loss, heat intolerance, tremors, but without hyper defecation, palpitations, anxiety.  The thyrotoxicosis was most likely related to amiodarone treatment.  This was stopped since, in 04/2017. - He was started on methimazole 5 mg  twice daily before discharge from the hospital at that time. When I saw him, since it was not clear whether he had AIT type I (Amiodarone-induced hyperthyroidism - TSI elevated, but  mildly) or type II (Amiodarone-induced thyroiditis - Tg elevated, no increased blood flow on ultrasound), I advised him to increase methimazole to 10 mg twice a day and also start prednisone 20 mg daily.  On this regimen, he started to feel much better and he tells me that now he feels at baseline.  He gained weight and does not have any complaints now. - At this visit, we will check his TSH, free T4, free T3 and change his regimen accordingly - He continues on a beta-blocker per cardiology - I advised him to join my chart to communicate easier - RTC in 4 months, but likely sooner for repeat labs  2. Thyroid nodules - No neck compression symptoms - I reviewed the results of his ultrasound along with the patient.  He has several small thyroid nodules, all spongiform, which is an indication of benignity, except for 1 cystic nodule of 1.4 cm.  None worrisome.  No intervention needed.  3 Elevated HbA1c  - an HbA1c was obtained by mistake today >> in the diabetic range: 6.9% - advised to change diet -he is aware he needs to do this - will recheck at next visit  Needs MMI refills.  Component     Latest Ref Rng & Units 08/13/2017  TSH     0.35 - 4.50 uIU/mL 16.53 (H)  Hemoglobin A1C      6.9  T4,Free(Direct)     0.60 - 1.60 ng/dL 0.56 (L)  Triiodothyronine,Free,Serum     2.3 - 4.2 pg/mL 2.2 (L)   Tests are now hypothyroid.  We will need to reduce both the methimazole and the prednisone and recheck his tests in 5 weeks, which we probably can reduce them even more.   For now, I would suggest to decrease the methimazole to 5 mg twice a day and the prednisone to 5 mg daily.  Philemon Kingdom, MD PhD [facility]

## 2017-08-13 NOTE — Patient Instructions (Signed)
For now, please continue: - methimazole 10 mg 2x a day - prednisone 20 mg daily  Please stop at the lab.  Please come back for labs in 1.5 months.  Please come back for a follow-up appointment in 4 months.

## 2017-08-14 ENCOUNTER — Other Ambulatory Visit: Payer: Self-pay | Admitting: Internal Medicine

## 2017-08-14 ENCOUNTER — Encounter: Payer: Self-pay | Admitting: Cardiology

## 2017-08-14 ENCOUNTER — Telehealth: Payer: Self-pay

## 2017-08-14 MED ORDER — PREDNISONE 10 MG PO TABS: 5.0000 mg | ORAL_TABLET | Freq: Every day | ORAL | 1 refills | Status: DC

## 2017-08-14 MED ORDER — METHIMAZOLE 5 MG PO TABS: 5.0000 mg | ORAL_TABLET | Freq: Two times a day (BID) | ORAL | 1 refills | Status: DC

## 2017-08-14 NOTE — Telephone Encounter (Signed)
-----  Message from Philemon Kingdom, MD sent at 08/14/2017  1:08 PM EDT ----- Larey Seat, can you please call pt: The thyroid test improved, but now he now became hypothyroid, so we need to back off his methimazole and prednisone.  Please advise him to take: - MMI 5 mg twice a day (I sent a prescription for 5 mg tablets to his pharmacy) - Prednisone 5 mg daily in a.m. (half of a 10 mg tablet) We will need another set of labs in 5 weeks (labs are in).

## 2017-08-14 NOTE — Telephone Encounter (Signed)
Spoke to patient. Gave lab results and med instructions Scheduled lab visit. Pt verbalized understanding.

## 2017-08-16 ENCOUNTER — Telehealth: Payer: Self-pay | Admitting: Internal Medicine

## 2017-08-16 DIAGNOSIS — N183 Chronic kidney disease, stage 3 (moderate): Secondary | ICD-10-CM | POA: Diagnosis not present

## 2017-08-16 DIAGNOSIS — I5042 Chronic combined systolic (congestive) and diastolic (congestive) heart failure: Secondary | ICD-10-CM | POA: Diagnosis not present

## 2017-08-16 DIAGNOSIS — I481 Persistent atrial fibrillation: Secondary | ICD-10-CM | POA: Diagnosis not present

## 2017-08-16 DIAGNOSIS — J449 Chronic obstructive pulmonary disease, unspecified: Secondary | ICD-10-CM | POA: Diagnosis not present

## 2017-08-16 DIAGNOSIS — Z951 Presence of aortocoronary bypass graft: Secondary | ICD-10-CM | POA: Diagnosis not present

## 2017-08-16 DIAGNOSIS — I255 Ischemic cardiomyopathy: Secondary | ICD-10-CM | POA: Diagnosis not present

## 2017-08-16 DIAGNOSIS — I130 Hypertensive heart and chronic kidney disease with heart failure and stage 1 through stage 4 chronic kidney disease, or unspecified chronic kidney disease: Secondary | ICD-10-CM | POA: Diagnosis not present

## 2017-08-16 DIAGNOSIS — Z4682 Encounter for fitting and adjustment of non-vascular catheter: Secondary | ICD-10-CM | POA: Diagnosis not present

## 2017-08-16 DIAGNOSIS — J90 Pleural effusion, not elsewhere classified: Secondary | ICD-10-CM | POA: Diagnosis not present

## 2017-08-16 NOTE — Telephone Encounter (Signed)
Morey Hummingbird from [facility] is calling she has medication questions, please advise [phone]

## 2017-08-16 NOTE — Telephone Encounter (Signed)
I called Morey Hummingbird- she needed clarification on the patient's meds and the MD's advisement on his med changes below and 08/13/17 /lab results.   Notes recorded by Leia Alf, CMA on 08/14/2017 at 4:27 PM EDT Please see phone note. Thanks.  ------  Notes recorded by Philemon Kingdom, MD on 08/14/2017 at 1:08 PM EDT Larey Seat, can you please call pt: The thyroid test improved, but now he now became hypothyroid, so we need to back off his methimazole and prednisone. Please advise him to take: - MMI 5 mg twice a day (I sent a prescription for 5 mg tablets to his pharmacy) - Prednisone 5 mg daily in a.m. (half of a 10 mg tablet) We will need another set of labs in 5 weeks (labs are in).

## 2017-08-21 ENCOUNTER — Other Ambulatory Visit: Payer: Self-pay | Admitting: Cardiothoracic Surgery

## 2017-08-21 ENCOUNTER — Ambulatory Visit: Payer: Medicare HMO | Admitting: Cardiothoracic Surgery

## 2017-08-21 DIAGNOSIS — J90 Pleural effusion, not elsewhere classified: Secondary | ICD-10-CM

## 2017-08-22 ENCOUNTER — Ambulatory Visit: Payer: Medicare HMO | Admitting: Cardiothoracic Surgery

## 2017-08-22 ENCOUNTER — Encounter: Payer: Self-pay | Admitting: Cardiothoracic Surgery

## 2017-08-22 ENCOUNTER — Other Ambulatory Visit: Payer: Self-pay

## 2017-08-22 ENCOUNTER — Ambulatory Visit
Admission: RE | Admit: 2017-08-22 | Discharge: 2017-08-22 | Disposition: A | Discharge status: Home / Self Care | Payer: Medicare HMO | Source: Ambulatory Visit | Attending: Cardiothoracic Surgery | Admitting: Cardiothoracic Surgery

## 2017-08-22 VITALS — BP 112/75 | HR 80 | Resp 16 | Ht 72.0 in | Wt 172.0 lb

## 2017-08-22 DIAGNOSIS — J90 Pleural effusion, not elsewhere classified: Secondary | ICD-10-CM | POA: Diagnosis not present

## 2017-08-22 DIAGNOSIS — I5042 Chronic combined systolic (congestive) and diastolic (congestive) heart failure: Secondary | ICD-10-CM | POA: Diagnosis not present

## 2017-08-22 DIAGNOSIS — I481 Persistent atrial fibrillation: Secondary | ICD-10-CM | POA: Diagnosis not present

## 2017-08-22 DIAGNOSIS — Z9689 Presence of other specified functional implants: Secondary | ICD-10-CM | POA: Diagnosis not present

## 2017-08-22 DIAGNOSIS — I4819 Other persistent atrial fibrillation: Secondary | ICD-10-CM

## 2017-08-22 NOTE — Progress Notes (Signed)
PCP is [name], MD Referring Provider is Chalmers Cater Satira Mccallum*  Chief Complaint  Patient presents with  . Pleural Effusion    f/u from surgery with CXR ...insertion of a R pleurX.07/26/17....LAST THREE DRAINS ONLY GTTS /MONDAY  . Routine Post Op    HPI: Patient returns for scheduled visit for assessment of right Pleurx catheter.  The patient had the catheter placed 6 weeks ago for recurrent right pleural effusion attributed to heart failure.  At the time of insertion the catheter drained almost 2 L.  The past 2 weeks the drainage has been trace.  His cardiac medications have recently been modified and he is now in sinus rhythm.  He denies weight gain or edema elsewhere.  Chest x-ray taken before the attempted drainage in the office today shows minimal right pleural effusion.  Catheter is in good position.  We will the catheter and leave it alone for 2 weeks.  Have the patient return with a chest x-ray and then drain the Pleurx catheter here and if it remains trace drainage we will schedule him for catheter removal. No symptoms of shortness of breath or fever Catheter sutures removed in office today Past Medical History:  Diagnosis Date  . AICD (automatic cardioverter/defibrillator) present   . Arthritis    "minor everywhere" (04/22/2015)  . Asthma    "a touch"  . Barrett esophagus   . CAD (coronary artery disease)    a. s/p 2 vessel CABG with LIMA to LAD, SVG to diagonal 1, SVG to RCA 12/09.  Marland Kitchen Chronic combined systolic and diastolic CHF (congestive heart failure) (HCC)    dry weight 197-200lbs.  . CKD (chronic kidney disease), stage III ([place])   . Dyspnea   . GERD (gastroesophageal reflux disease)   . H/O: rheumatic fever   . Hepatitis 1957   "don't know what kind:  . History of hiatal hernia   . History of PFTs    a. Amiodarone started 5/16 >> PFTs w/ DLCO 6/16:  FEV1 72% predicted, FEV1/FVC 66%, DLCO 66% >> minimal reversible obstructive airways disease with mild diffusion  defect (suggestive of emphysema but absence of hyperinflation inconsistent with dx)  . HTN (hypertension)   . Hypercholesteremia   . NICM (nonischemic cardiomyopathy) ([place])    EF 15-20% by echo 2017  . Orthostatic hypotension   . PAF (paroxysmal atrial fibrillation) ([place])   . Pneumonia 08/2014   "dr thought I may have had a touch"  . Pre-diabetes   . S/P AVR (aortic valve replacement) 2009   a. severe AS s/p AVR with pericardial tissue valve 2009.  Marland Kitchen Ventricular tachycardia [facility])     Past Surgical History:  Procedure Laterality Date  . AORTIC VALVE REPLACEMENT  with 23-mm Magna Ease pericardial valve, model number   with 23-mm Magna Ease pericardial valve, model number3300TFX, serial number [id number]   . APPENDECTOMY  1940s  . BI-VENTRICULAR IMPLANTABLE CARDIOVERTER DEFIBRILLATOR  (CRT-D)  04/22/2015  . CARDIAC CATHETERIZATION N/A 11/04/2014   Procedure: Left Heart Cath and Cors/Grafts Angiography;  Surgeon: Leonie Man, MD;  Location: [place] [facility];  Service: Cardiovascular;  Laterality: N/A;  . CARDIAC VALVE REPLACEMENT    . CARDIOVERSION N/A 01/27/2013   Procedure: CARDIOVERSION;  Surgeon: Sueanne Margarita, MD;  Location: [place];  Service: Cardiovascular;  Laterality: N/A;  . CARDIOVERSION N/A 08/25/2014   Procedure: CARDIOVERSION;  Surgeon: Sanda Klein, MD;  [facility];  Service: Cardiovascular;  Laterality: N/A;  . CATARACT EXTRACTION W/ INTRAOCULAR LENS  IMPLANT, BILATERAL Bilateral   . CHEST TUBE INSERTION Right 07/26/2017   Procedure: INSERTION PLEURAL DRAINAGE CATHETER - RIGHT;  Surgeon: Ivin Poot, MD;  Location: [place];  Service: Thoracic;  Laterality: Right;  . CORONARY ARTERY BYPASS GRAFT  03/10/2008   x 3 Dr. Roxan Hockey  . EP IMPLANTABLE DEVICE N/A 04/22/2015   Procedure: BiV ICD Insertion CRT-D;  Surgeon: [name], MD;  Location: [place] [facility];  Service: Cardiovascular;  Laterality: N/A;  . ESOPHAGOGASTRODUODENOSCOPY (EGD)  WITH ESOPHAGEAL DILATION  X1  . ESOPHAGOGASTRODUODENOSCOPY (EGD) WITH PROPOFOL N/A 05/09/2017   Procedure: ESOPHAGOGASTRODUODENOSCOPY (EGD) WITH PROPOFOL;  Surgeon: Carol Ada, MD;  Location: [place];  Service: Endoscopy;  Laterality: N/A;  . IR THORACENTESIS ASP PLEURAL SPACE W/IMG GUIDE  05/27/2017  . IR THORACENTESIS ASP PLEURAL SPACE W/IMG GUIDE  06/17/2017  . IR THORACENTESIS ASP PLEURAL SPACE W/IMG GUIDE  06/18/2017  . IR THORACENTESIS ASP PLEURAL SPACE W/IMG GUIDE  07/11/2017  . RIGHT HEART CATH AND CORONARY/GRAFT ANGIOGRAPHY N/A 05/31/2017   Procedure: RIGHT HEART CATH AND CORONARY/GRAFT ANGIOGRAPHY;  Surgeon: Jolaine Artist, MD;  Location: [place] [facility];  Service: Cardiovascular;  Laterality: N/A;  . TONSILLECTOMY      Family History  Problem Relation Age of Onset  . Alzheimer's disease Mother   . COPD Daughter   . Alzheimer's disease Unknown   . COPD Sister   . Depression Sister   . Heart disease Sister   . Hyperlipidemia Sister   . Hypertension Sister   . Esophageal cancer Sister        + smoker    Social History Social History   Tobacco Use  . Smoking status: Never Smoker  . Smokeless tobacco: Never Used  Substance Use Topics  . Alcohol use: Not Currently    Comment: 04/22/2015 "nothing since the 1980s; never had a problem w/it"  . Drug use: No    Current Outpatient Medications  Medication Sig Dispense Refill  . acetaminophen (TYLENOL) 500 MG tablet Take 1,000 mg by mouth daily as needed for headache (pain).     Marland Kitchen albuterol (PROVENTIL HFA;VENTOLIN HFA) 108 (90 Base) MCG/ACT inhaler Inhale 1-2 puffs into the lungs every 6 (six) hours as needed for wheezing.     . Amino Acids-Protein Hydrolys (FEEDING SUPPLEMENT, PRO-STAT SUGAR FREE 64,) LIQD Take 30 mLs by mouth daily. 900 mL 0  . atorvastatin (LIPITOR) 20 MG tablet TAKE 1 TABLET (20 MG TOTAL) DAILY AT 6 PM. 90 tablet 3  . carvedilol (COREG) 12.5 MG tablet Take 6.25 mg by mouth 2 (two) times daily.      Marland Kitchen ELIQUIS 2.5 MG TABS tablet TAKE 1 TABLET TWICE DAILY 180 tablet 0  . feeding supplement, ENSURE ENLIVE, (ENSURE ENLIVE) LIQD Take 237 mLs by mouth 2 (two) times daily between meals. 60 Bottle 0  . furosemide (LASIX) 40 MG tablet Take 1 tablet (40 mg total) by mouth 2 (two) times daily. 180 tablet 0  . hydrALAZINE (APRESOLINE) 25 MG tablet Take 12.5 mg by mouth 2 (two) times daily.    . isosorbide mononitrate (IMDUR) 30 MG 24 hr tablet TAKE 1 TABLET (30 MG TOTAL) BY MOUTH DAILY. 90 tablet 3  . lactose free nutrition (BOOST) LIQD Take 237 mLs by mouth 3 (three) times daily as needed (meal supplement).    . methimazole (TAPAZOLE) 5 MG tablet Take 1 tablet (5 mg total) by mouth 2 (two) times daily. 90 tablet 1  . mexiletine (MEXITIL) 150 MG capsule  Take 2 capsules (300 mg total) by mouth 3 (three) times daily. 180 capsule 11  . mirtazapine (REMERON) 15 MG tablet Take 1 tablet (15 mg total) by mouth at bedtime. 30 tablet 0  . Multiple Vitamin (MULTIVITAMIN WITH MINERALS) TABS tablet Take 1 tablet by mouth daily.     . Omega-3 Fatty Acids (FISH OIL) 1000 MG CAPS Take 2,000 mg by mouth 2 (two) times daily.    . pantoprazole (PROTONIX) 40 MG tablet Take 1 tablet (40 mg total) by mouth daily before breakfast. 30 tablet 3  . potassium chloride SA (K-DUR,KLOR-CON) 20 MEQ tablet Take 1 tablet (20 mEq total) by mouth daily. 30 tablet 3  . predniSONE (DELTASONE) 10 MG tablet Take 0.5 tablets (5 mg total) by mouth daily with breakfast. 30 tablet 1  . ranitidine (ZANTAC) 300 MG tablet Take 300 mg by mouth at bedtime.    . traMADol (ULTRAM) 50 MG tablet Take 1 tablet (50 mg total) by mouth every 12 (twelve) hours as needed for moderate pain or severe pain. 20 tablet 0   No current facility-administered medications for this visit.     Allergies  Allergen Reactions  . Novocain [Procaine] Swelling  . Amiodarone Nausea And Vomiting    Review of Systems  Trying to gain weight Working in the yard and his  mower shop  BP 112/75 (BP Location: Right Arm, Patient Position: Sitting, Cuff Size: Normal)   Pulse 80   Resp 16   Ht 6' (1.829 m)   Wt 172 lb (78 kg)   SpO2 98% Comment: RA  BMI 23.33 kg/m  Physical Exam      Exam    General- alert and comfortable    Neck- no JVD, no cervical adenopathy palpable, no carotid bruit   Lungs- clear without rales, wheezes   Cor- regular rate and rhythm, no murmur , gallop   Abdomen- soft, non-tender   Extremities - warm, non-tender, minimal edema   Neuro- oriented, appropriate, no focal weakness   Diagnostic Tests:  Chest x-ray personally reviewed showing Pleurx catheter in good position with minimal effusion Impression: Resolution of recurrent right pleural effusion  Plan: Pleurx catheter and stop drainage in the next 2 weeks Return and drain catheter and if no fluid returns with chest x-ray unchanged we will schedule for removal of catheter after the patient holds his Eliquis for 4 doses  Len Childs, MD [facility] [phone]

## 2017-08-23 DIAGNOSIS — I255 Ischemic cardiomyopathy: Secondary | ICD-10-CM | POA: Diagnosis not present

## 2017-08-23 DIAGNOSIS — I130 Hypertensive heart and chronic kidney disease with heart failure and stage 1 through stage 4 chronic kidney disease, or unspecified chronic kidney disease: Secondary | ICD-10-CM | POA: Diagnosis not present

## 2017-08-23 DIAGNOSIS — Z951 Presence of aortocoronary bypass graft: Secondary | ICD-10-CM | POA: Diagnosis not present

## 2017-08-23 DIAGNOSIS — J90 Pleural effusion, not elsewhere classified: Secondary | ICD-10-CM | POA: Diagnosis not present

## 2017-08-23 DIAGNOSIS — I481 Persistent atrial fibrillation: Secondary | ICD-10-CM | POA: Diagnosis not present

## 2017-08-23 DIAGNOSIS — I5042 Chronic combined systolic (congestive) and diastolic (congestive) heart failure: Secondary | ICD-10-CM | POA: Diagnosis not present

## 2017-08-23 DIAGNOSIS — Z4682 Encounter for fitting and adjustment of non-vascular catheter: Secondary | ICD-10-CM | POA: Diagnosis not present

## 2017-08-23 DIAGNOSIS — J449 Chronic obstructive pulmonary disease, unspecified: Secondary | ICD-10-CM | POA: Diagnosis not present

## 2017-08-23 DIAGNOSIS — N183 Chronic kidney disease, stage 3 (moderate): Secondary | ICD-10-CM | POA: Diagnosis not present

## 2017-08-27 LAB — CUP PACEART REMOTE DEVICE CHECK
Battery Remaining Longevity: 66 mo
Brady Statistic AP VS Percent: 0.33 %
Brady Statistic RA Percent Paced: 18.38 %
Brady Statistic RV Percent Paced: 63.59 %
HighPow Impedance: 57 Ohm
Implantable Lead Implant Date: 20170113
Implantable Lead Implant Date: 20170113
Implantable Lead Location: 753858
Implantable Lead Location: 753860
Implantable Lead Model: 4298
Implantable Lead Model: 5076
Lead Channel Impedance Value: 342 Ohm
Lead Channel Impedance Value: 342 Ohm
Lead Channel Impedance Value: 437 Ohm
Lead Channel Impedance Value: 570 Ohm
Lead Channel Impedance Value: 627 Ohm
Lead Channel Impedance Value: 627 Ohm
Lead Channel Pacing Threshold Amplitude: 0.375 V
Lead Channel Pacing Threshold Amplitude: 0.75 V
Lead Channel Pacing Threshold Pulse Width: 0.4 ms
Lead Channel Pacing Threshold Pulse Width: 0.4 ms
Lead Channel Sensing Intrinsic Amplitude: 2.875 mV
Lead Channel Sensing Intrinsic Amplitude: 2.875 mV
Lead Channel Sensing Intrinsic Amplitude: 9.25 mV
Lead Channel Setting Pacing Amplitude: 1.5 V
Lead Channel Setting Pacing Pulse Width: 0.4 ms
Lead Channel Setting Pacing Pulse Width: 0.4 ms
Lead Channel Setting Sensing Sensitivity: 0.3 mV
MDC IDC LEAD IMPLANT DT: 20170113
MDC IDC LEAD LOCATION: 753859
MDC IDC MSMT BATTERY VOLTAGE: 2.98 V
MDC IDC MSMT LEADCHNL LV IMPEDANCE VALUE: 323 Ohm
MDC IDC MSMT LEADCHNL LV IMPEDANCE VALUE: 380 Ohm
MDC IDC MSMT LEADCHNL LV IMPEDANCE VALUE: 380 Ohm
MDC IDC MSMT LEADCHNL LV IMPEDANCE VALUE: 570 Ohm
MDC IDC MSMT LEADCHNL LV IMPEDANCE VALUE: 646 Ohm
MDC IDC MSMT LEADCHNL LV PACING THRESHOLD PULSEWIDTH: 0.4 ms
MDC IDC MSMT LEADCHNL RV IMPEDANCE VALUE: 342 Ohm
MDC IDC MSMT LEADCHNL RV IMPEDANCE VALUE: 437 Ohm
MDC IDC MSMT LEADCHNL RV PACING THRESHOLD AMPLITUDE: 0.625 V
MDC IDC MSMT LEADCHNL RV SENSING INTR AMPL: 9.25 mV
MDC IDC PG IMPLANT DT: 20170113
MDC IDC SESS DTM: 20190506113045
MDC IDC SET LEADCHNL LV PACING AMPLITUDE: 1.5 V
MDC IDC SET LEADCHNL RV PACING AMPLITUDE: 2 V
MDC IDC STAT BRADY AP VP PERCENT: 18.13 %
MDC IDC STAT BRADY AS VP PERCENT: 80.01 %
MDC IDC STAT BRADY AS VS PERCENT: 1.53 %

## 2017-09-03 ENCOUNTER — Telehealth: Payer: Self-pay | Admitting: Internal Medicine

## 2017-09-10 ENCOUNTER — Other Ambulatory Visit: Payer: Self-pay | Admitting: Cardiothoracic Surgery

## 2017-09-10 DIAGNOSIS — J90 Pleural effusion, not elsewhere classified: Secondary | ICD-10-CM

## 2017-09-11 ENCOUNTER — Other Ambulatory Visit: Payer: Self-pay | Admitting: *Deleted

## 2017-09-11 ENCOUNTER — Other Ambulatory Visit: Payer: Self-pay

## 2017-09-11 ENCOUNTER — Ambulatory Visit: Payer: Medicare HMO | Admitting: Cardiothoracic Surgery

## 2017-09-11 ENCOUNTER — Encounter: Payer: Self-pay | Admitting: Cardiothoracic Surgery

## 2017-09-11 ENCOUNTER — Encounter: Payer: Self-pay | Admitting: *Deleted

## 2017-09-11 ENCOUNTER — Ambulatory Visit
Admission: RE | Admit: 2017-09-11 | Discharge: 2017-09-11 | Disposition: A | Discharge status: Home / Self Care | Payer: Medicare HMO | Source: Ambulatory Visit | Attending: Cardiothoracic Surgery | Admitting: Cardiothoracic Surgery

## 2017-09-11 VITALS — BP 130/80 | HR 76 | Resp 16 | Ht 72.0 in | Wt 170.0 lb

## 2017-09-11 DIAGNOSIS — I5042 Chronic combined systolic (congestive) and diastolic (congestive) heart failure: Secondary | ICD-10-CM

## 2017-09-11 DIAGNOSIS — J90 Pleural effusion, not elsewhere classified: Secondary | ICD-10-CM

## 2017-09-11 DIAGNOSIS — Z9689 Presence of other specified functional implants: Secondary | ICD-10-CM

## 2017-09-11 NOTE — Progress Notes (Signed)
PCP is [name], MD Referring Provider is Chalmers Cater Satira Mccallum*  Chief Complaint  Patient presents with  . Pleural Effusion    2 week f/u with CXR....has not drained the pleurX catheter in 2 weeks    HPI: Scheduled office visit for right Pleurx catheter follow-up.  Catheter placed 2 months ago at request of advanced heart failure service for recurrent right pleural effusion associated with diastolic heart failure.  With medical therapy his pleural effusion has resolved and there is been no drainage from the catheter in over 2 weeks.  We will hold his Eliquis for 36 hours and schedule Pleurx catheter removal on June 7 in hospital short stay   Past Medical History:  Diagnosis Date  . Abnormal nuclear stress test: HIGH RISK 11/04/2014  . AICD (automatic cardioverter/defibrillator) present   . Arthritis    "minor everywhere" (04/22/2015)  . Asthma    "a touch"  . Barrett esophagus   . CAD (coronary artery disease)    a. s/p 2 vessel CABG with LIMA to LAD, SVG to diagonal 1, SVG to RCA 12/09.  . Cardiac resynchronization therapy defibrillator (CRT-D) in place   . Chronic anticoagulation 01/23/2013  . Chronic combined systolic and diastolic CHF (congestive heart failure) (HCC)    dry weight 197-200lbs.  . Chronic combined systolic and diastolic heart failure ([place])   . CKD (chronic kidney disease) stage 3, GFR 30-59 ml/min (HCC) 08/23/2014  . CKD (chronic kidney disease), stage III ([place])   . Coronary artery disease 01/23/2013  . Diastolic dysfunction 07/86/7544  . Dyslipidemia 01/23/2013  . Dyspnea   . Essential hypertension 01/23/2013  . GERD (gastroesophageal reflux disease)   . GI bleed 05/08/2017  . H/O: rheumatic fever   . Hepatitis 1957   "don't know what kind:  . History of aortic valve replacement with bioprosthetic valve   . History of hiatal hernia   . History of PFTs    a. Amiodarone started 5/16 >> PFTs w/ DLCO 6/16:  FEV1 72% predicted, FEV1/FVC 66%, DLCO 66% >>  minimal reversible obstructive airways disease with mild diffusion defect (suggestive of emphysema but absence of hyperinflation inconsistent with dx)  . HTN (hypertension)   . Hypercholesteremia   . Hyperkalemia 08/23/2014  . Hyperthyroidism 05/26/2017  . Leg weakness 01/28/2017  . Multiple thyroid nodules 08/13/2017  . NICM (nonischemic cardiomyopathy) ([place])    EF 15-20% by echo 2017  . Orthostatic hypotension   . PAF (paroxysmal atrial fibrillation) ([place])   . Persistent atrial fibrillation ([place]) 01/23/2013  . Pleural effusion on right 05/26/2017  . Pneumonia 08/2014   "dr thought I may have had a touch"  . Pre-diabetes   . Protein-calorie malnutrition, severe 05/27/2017  . S/P AVR (aortic valve replacement) 2009   a. severe AS s/p AVR with pericardial tissue valve 2009.  Marland Kitchen Shingles 08/23/2014  . Ventricular tachycardia ([place])   . VT (ventricular tachycardia) ([place]) 05/06/2017    Past Surgical History:  Procedure Laterality Date  . AORTIC VALVE REPLACEMENT  with 23-mm Magna Ease pericardial valve, model number   with 23-mm Magna Ease pericardial valve, model number3300TFX, serial number [id number]   . APPENDECTOMY  1940s  . BI-VENTRICULAR IMPLANTABLE CARDIOVERTER DEFIBRILLATOR  (CRT-D)  04/22/2015  . CARDIAC CATHETERIZATION N/A 11/04/2014   Procedure: Left Heart Cath and Cors/Grafts Angiography;  Surgeon: Leonie Man, MD;  Location: [place] [facility];  Service: Cardiovascular;  Laterality: N/A;  . CARDIAC VALVE REPLACEMENT    . CARDIOVERSION N/A 01/27/2013  Procedure: CARDIOVERSION;  Surgeon: Sueanne Margarita, MD;  Location: [place];  Service: Cardiovascular;  Laterality: N/A;  . CARDIOVERSION N/A 08/25/2014   Procedure: CARDIOVERSION;  Surgeon: Sanda Klein, MD;  [facility];  Service: Cardiovascular;  Laterality: N/A;  . CATARACT EXTRACTION W/ INTRAOCULAR LENS  IMPLANT, BILATERAL Bilateral   . CHEST TUBE INSERTION Right 07/26/2017   Procedure: INSERTION PLEURAL DRAINAGE  CATHETER - RIGHT;  Surgeon: Ivin Poot, MD;  Location: [place];  Service: Thoracic;  Laterality: Right;  . CORONARY ARTERY BYPASS GRAFT  03/10/2008   x 3 Dr. Roxan Hockey  . EP IMPLANTABLE DEVICE N/A 04/22/2015   Procedure: BiV ICD Insertion CRT-D;  Surgeon: [name], MD;  Location: [place] [facility];  Service: Cardiovascular;  Laterality: N/A;  . ESOPHAGOGASTRODUODENOSCOPY (EGD) WITH ESOPHAGEAL DILATION  X1  . ESOPHAGOGASTRODUODENOSCOPY (EGD) WITH PROPOFOL N/A 05/09/2017   Procedure: ESOPHAGOGASTRODUODENOSCOPY (EGD) WITH PROPOFOL;  Surgeon: Carol Ada, MD;  Location: [place];  Service: Endoscopy;  Laterality: N/A;  . IR THORACENTESIS ASP PLEURAL SPACE W/IMG GUIDE  05/27/2017  . IR THORACENTESIS ASP PLEURAL SPACE W/IMG GUIDE  06/17/2017  . IR THORACENTESIS ASP PLEURAL SPACE W/IMG GUIDE  06/18/2017  . IR THORACENTESIS ASP PLEURAL SPACE W/IMG GUIDE  07/11/2017  . RIGHT HEART CATH AND CORONARY/GRAFT ANGIOGRAPHY N/A 05/31/2017   Procedure: RIGHT HEART CATH AND CORONARY/GRAFT ANGIOGRAPHY;  Surgeon: Jolaine Artist, MD;  Location: [place] [facility];  Service: Cardiovascular;  Laterality: N/A;  . TONSILLECTOMY      Family History  Problem Relation Age of Onset  . Alzheimer's disease Mother   . COPD Daughter   . Alzheimer's disease Unknown   . COPD Sister   . Depression Sister   . Heart disease Sister   . Hyperlipidemia Sister   . Hypertension Sister   . Esophageal cancer Sister        + smoker    Social History Social History   Tobacco Use  . Smoking status: Never Smoker  . Smokeless tobacco: Never Used  Substance Use Topics  . Alcohol use: Not Currently    Comment: 04/22/2015 "nothing since the 1980s; never had a problem w/it"  . Drug use: No    Current Outpatient Medications  Medication Sig Dispense Refill  . acetaminophen (TYLENOL) 500 MG tablet Take 1,000 mg by mouth daily as needed for headache (pain).     Marland Kitchen albuterol (PROVENTIL HFA;VENTOLIN HFA) 108 (90  Base) MCG/ACT inhaler Inhale 1-2 puffs into the lungs every 6 (six) hours as needed for wheezing.     Marland Kitchen atorvastatin (LIPITOR) 20 MG tablet TAKE 1 TABLET (20 MG TOTAL) DAILY AT 6 PM. 90 tablet 3  . carvedilol (COREG) 12.5 MG tablet Take 6.25 mg by mouth 2 (two) times daily.     Marland Kitchen ELIQUIS 2.5 MG TABS tablet TAKE 1 TABLET TWICE DAILY 180 tablet 0  . furosemide (LASIX) 40 MG tablet Take 1 tablet (40 mg total) by mouth 2 (two) times daily. 180 tablet 0  . hydrALAZINE (APRESOLINE) 25 MG tablet Take 12.5 mg by mouth 2 (two) times daily.    . isosorbide mononitrate (IMDUR) 30 MG 24 hr tablet TAKE 1 TABLET (30 MG TOTAL) BY MOUTH DAILY. 90 tablet 3  . lactose free nutrition (BOOST) LIQD Take 237 mLs by mouth 3 (three) times daily as needed (meal supplement).    . methimazole (TAPAZOLE) 5 MG tablet Take 1 tablet (5 mg total) by mouth 2 (two) times daily. 90 tablet 1  .  mexiletine (MEXITIL) 150 MG capsule Take 2 capsules (300 mg total) by mouth 3 (three) times daily. 180 capsule 11  . Multiple Vitamin (MULTIVITAMIN WITH MINERALS) TABS tablet Take 1 tablet by mouth daily.     . Omega-3 Fatty Acids (FISH OIL) 1000 MG CAPS Take 2,000 mg by mouth 2 (two) times daily.    . potassium chloride SA (K-DUR,KLOR-CON) 20 MEQ tablet Take 1 tablet (20 mEq total) by mouth daily. 30 tablet 3  . predniSONE (DELTASONE) 10 MG tablet Take 0.5 tablets (5 mg total) by mouth daily with breakfast. 30 tablet 1  . ranitidine (ZANTAC) 300 MG tablet Take 300 mg by mouth at bedtime.    . traMADol (ULTRAM) 50 MG tablet Take 1 tablet (50 mg total) by mouth every 12 (twelve) hours as needed for moderate pain or severe pain. 20 tablet 0  . Amino Acids-Protein Hydrolys (FEEDING SUPPLEMENT, PRO-STAT SUGAR FREE 64,) LIQD Take 30 mLs by mouth daily. (Patient not taking: Reported on 09/11/2017) 900 mL 0  . feeding supplement, ENSURE ENLIVE, (ENSURE ENLIVE) LIQD Take 237 mLs by mouth 2 (two) times daily between meals. (Patient not taking: Reported on  09/11/2017) 60 Bottle 0  . mirtazapine (REMERON) 15 MG tablet Take 1 tablet (15 mg total) by mouth at bedtime. (Patient not taking: Reported on 09/11/2017) 30 tablet 0  . pantoprazole (PROTONIX) 40 MG tablet Take 1 tablet (40 mg total) by mouth daily before breakfast. (Patient not taking: Reported on 09/11/2017) 30 tablet 3   No current facility-administered medications for this visit.     Allergies  Allergen Reactions  . Novocain [Procaine] Swelling  . Amiodarone Nausea And Vomiting    Review of Systems  No fever No weight gain No shortness of breath No bleeding complications No abdominal pain No syncope or fall  BP 130/80 (BP Location: Right Arm, Patient Position: Sitting, Cuff Size: Large)   Pulse 76   Resp 16   Ht 6' (1.829 m)   Wt 170 lb (77.1 kg)   SpO2 98% Comment: RA  BMI 23.06 kg/m  Physical Exam      Exam    General- alert and comfortable    Neck- no JVD, no cervical adenopathy palpable, no carotid bruit   Lungs- clear without rales, wheezes.  Pleurx catheter site clean and dry   Cor- regular rate and rhythm, no murmur , gallop   Abdomen- soft, non-tender   Extremities - warm, non-tender, minimal edema   Neuro- oriented, appropriate, no focal weakness   Diagnostic Tests: Chest x-ray shows no pleural effusion, Pleurx catheter in good position  Impression: No further need for Pleurx catheter to manage heart failure fluid retention  Plan: Pleurx catheter removal scheduled at hospital in June 7.  Patient will hold Eliquis 3 doses.  Len Childs, MD [facility] [phone]

## 2017-09-11 NOTE — Patient Outreach (Signed)
[facility] [name]) Care Management   09/11/2017  [name] 02-23-1933 [id number]  [name] is an 84 y.o. male  Subjective: Routine home visit with pt, HIPAA verified, pt reports he is to see Dr. Prescott Gum today to have pleurex catheter checked, pt reports there has not been any drainage from pleurex catheter in quite some time, home health discharged, pt reports he is getting outside more, working in garden, still not driving, has friend and family members to assist if needed.  Pt denies dyspnea but " sometimes I'm still weak when I walk, I use my walking stick outside"    Objective:   Vitals:   09/11/17 0959  BP: 118/64  Pulse: 75  Resp: 16  SpO2: 97%  Weight: 170 lb (77.1 kg)   ROS  Physical Exam  Constitutional: He is oriented to person, place, and time. He appears well-developed.  HENT:  Head: Normocephalic.  Neck: Normal range of motion.  Cardiovascular: Normal rate.  Respiratory: Effort normal and breath sounds normal.  GI: Soft. Bowel sounds are normal.  Musculoskeletal: Normal range of motion. He exhibits edema.  Slight edema left ankle area  Neurological: He is alert and oriented to person, place, and time.  Skin: Skin is warm and dry.  Psychiatric: He has a normal mood and affect. His behavior is normal. Judgment and thought content normal.    Encounter Medications:   Outpatient Encounter Medications as of 09/11/2017  Medication Sig Note  . acetaminophen (TYLENOL) 500 MG tablet Take 1,000 mg by mouth daily as needed for headache (pain).    Marland Kitchen albuterol (PROVENTIL HFA;VENTOLIN HFA) 108 (90 Base) MCG/ACT inhaler Inhale 1-2 puffs into the lungs every 6 (six) hours as needed for wheezing.    Marland Kitchen atorvastatin (LIPITOR) 20 MG tablet TAKE 1 TABLET (20 MG TOTAL) DAILY AT 6 PM.   . carvedilol (COREG) 12.5 MG tablet Take 6.25 mg by mouth 2 (two) times daily.    Marland Kitchen ELIQUIS 2.5 MG TABS tablet TAKE 1 TABLET TWICE DAILY   . furosemide (LASIX) 40 MG tablet Take  1 tablet (40 mg total) by mouth 2 (two) times daily.   . hydrALAZINE (APRESOLINE) 25 MG tablet Take 12.5 mg by mouth 2 (two) times daily.   . isosorbide mononitrate (IMDUR) 30 MG 24 hr tablet TAKE 1 TABLET (30 MG TOTAL) BY MOUTH DAILY.   . methimazole (TAPAZOLE) 5 MG tablet Take 1 tablet (5 mg total) by mouth 2 (two) times daily.   Marland Kitchen mexiletine (MEXITIL) 150 MG capsule Take 2 capsules (300 mg total) by mouth 3 (three) times daily.   . Multiple Vitamin (MULTIVITAMIN WITH MINERALS) TABS tablet Take 1 tablet by mouth daily.    . Omega-3 Fatty Acids (FISH OIL) 1000 MG CAPS Take 2,000 mg by mouth 2 (two) times daily.   . potassium chloride SA (K-DUR,KLOR-CON) 20 MEQ tablet Take 1 tablet (20 mEq total) by mouth daily.   . predniSONE (DELTASONE) 10 MG tablet Take 0.5 tablets (5 mg total) by mouth daily with breakfast.   . ranitidine (ZANTAC) 300 MG tablet Take 300 mg by mouth at bedtime.   . traMADol (ULTRAM) 50 MG tablet Take 1 tablet (50 mg total) by mouth every 12 (twelve) hours as needed for moderate pain or severe pain.   . Amino Acids-Protein Hydrolys (FEEDING SUPPLEMENT, PRO-STAT SUGAR FREE 64,) LIQD Take 30 mLs by mouth daily. (Patient not taking: Reported on 09/11/2017)   . feeding supplement, ENSURE ENLIVE, (ENSURE ENLIVE) LIQD  Take 237 mLs by mouth 2 (two) times daily between meals. (Patient not taking: Reported on 09/11/2017) 06/15/2017: See entry for Boost  . lactose free nutrition (BOOST) LIQD Take 237 mLs by mouth 3 (three) times daily as needed (meal supplement).   . mirtazapine (REMERON) 15 MG tablet Take 1 tablet (15 mg total) by mouth at bedtime. (Patient not taking: Reported on 09/11/2017)   . pantoprazole (PROTONIX) 40 MG tablet Take 1 tablet (40 mg total) by mouth daily before breakfast. (Patient not taking: Reported on 09/11/2017)    No facility-administered encounter medications on file as of 09/11/2017.     Functional Status:   In your present state of health, do you have any difficulty  performing the following activities: 07/26/2017 06/16/2017  Hearing? N N  Comment - -  Vision? N N  Difficulty concentrating or making decisions? N N  Comment - -  Walking or climbing stairs? N Y  Dressing or bathing? N N  Doing errands, shopping? - Y  Preparing Food and eating ? - -  Using the Toilet? - -  In the past six months, have you accidently leaked urine? - -  Do you have problems with loss of bowel control? - -  Managing your Medications? - -  Managing your Finances? - -  Housekeeping or managing your Housekeeping? - -  Some recent data might be hidden    Fall/Depression Screening:    Fall Risk  09/11/2017 07/10/2017 06/12/2017  Falls in the past year? Yes No No  Number falls in past yr: 1 - -  Injury with Fall? No - -  Risk for fall due to : Medication side effect Medication side effect;Impaired balance/gait Medication side effect;Impaired balance/gait  Follow up Falls evaluation completed;Falls prevention discussed - -   PHQ 2/9 Scores 06/12/2017 12/27/2015 05/09/2015  PHQ - 2 Score 1 0 0    Assessment:  Pt to see Dr. Radford Pax (cardiologist) 09/23/17, pt attending all appointments, following plan of care, RN CM discussed transfer to RN health coach and pt states he is outside a lot working in garden and doing other projects and declines this service.  Pt has [facility] contact information for future reference.  RN CM mailed case closure to patient's home and faxed case closure letter to primary MD Dr. Olen Pel.  [facility] CM Care Plan Problem One     Most Recent Value  Care Plan Problem One  Knowledge deficit related to CHF  Role Documenting the Problem One  Care Management Coordinator  Care Plan for Problem One  Active  [facility] Long Term Goal   Pt will verbalize/ demonstrate better understanding self care for CHF within 60 days  [facility] Long Term Goal Start Date  08/08/17  [facility] Long Term Goal Met Date  09/11/17  Interventions for Problem One Long Term Goal  RN CM reviewed medications with pt, reviewed  weight log.  [facility] CM Short Term Goal #1   Pt will verbalize CHF zones / action plan within 30 days  [facility] CM Short Term Goal #1 Start Date  07/10/17  [facility] CM Short Term Goal #1 Met Date  08/08/17 [goal re-established]  Interventions for Short Term Goal #1  RN CM reinforced CHF action plan/ zones, emphasis on yellow zone, importance of calling MD early for change in health status  [facility] CM Short Term Goal #2   Pt will weigh daily and record within 30 days  [facility] CM Short Term Goal #2 Start Date  06/06/17  [facility]  CM Short Term Goal #2 Met Date  07/10/17    [facility] CM Care Plan Problem Two     Most Recent Value  Care Plan Problem Two  Pt has decreased endurance  Role Documenting the Problem Two  Care Management Coordinator  Care Plan for Problem Two  Active  [facility] CM Short Term Goal #1   Pt will work with home health PT and complete prescribed exercises, verbalize increase endurance within 30 days  [facility] CM Short Term Goal #1 Start Date  07/10/17 [goal restarted]  [facility] CM Short Term Goal #1 Met Date   08/08/17      Plan: discharge pt today  [facility], BSN [place] Coordinator [phone]

## 2017-09-13 ENCOUNTER — Ambulatory Visit (HOSPITAL_COMMUNITY)
Admission: RE | Admit: 2017-09-13 | Discharge: 2017-09-13 | Disposition: A | Discharge status: Home / Self Care | Payer: Medicare HMO | Source: Ambulatory Visit | Attending: Cardiothoracic Surgery | Admitting: Cardiothoracic Surgery

## 2017-09-13 ENCOUNTER — Encounter (HOSPITAL_COMMUNITY)
Admission: RE | Disposition: A | Discharge status: Home / Self Care | Payer: Self-pay | Source: Ambulatory Visit | Attending: Cardiothoracic Surgery

## 2017-09-13 DIAGNOSIS — J90 Pleural effusion, not elsewhere classified: Secondary | ICD-10-CM | POA: Insufficient documentation

## 2017-09-13 HISTORY — PX: REMOVAL OF PLEURAL DRAINAGE CATHETER: SHX5080

## 2017-09-13 SURGERY — REMOVAL, CLOSED DRAINAGE CATHETER SYSTEM, PLEURAL
Anesthesia: Monitor Anesthesia Care | Laterality: Right

## 2017-09-13 NOTE — Procedures (Signed)
[name] [id number] Nov 26, 1932   Pre Procedure Diagnosis: Recurrent Pleural Effusion Post Procedure Diagnosis: Recurrent Pleural Effusion  Procedure  Attempted Drainage of Pleur-x catheter with no output obtained.  The right side was cleaned and prepped in sterile fashion.  10 ml of 2% lidocaine was used with adequate anesthesia achieved.  A small lateral incision was made, blunt dissection was performed for lysis of skin adhesions and removal of fibrinous band.  The catheter was removed without difficulty.  The incision was closed with 2 interuppted 3-0 Nylon sutures.   A clean dry dressing was placed.  The patient tolerated the procedure without difficulty and was discharged home in stable condition.    Blood Loss: Minimal   Ezreal Turay PA-C

## 2017-09-13 NOTE — Progress Notes (Signed)
PA called to come remove cath.

## 2017-09-14 ENCOUNTER — Encounter (HOSPITAL_COMMUNITY): Payer: Self-pay | Admitting: Cardiothoracic Surgery

## 2017-09-18 ENCOUNTER — Telehealth: Payer: Self-pay

## 2017-09-18 ENCOUNTER — Other Ambulatory Visit (INDEPENDENT_AMBULATORY_CARE_PROVIDER_SITE_OTHER): Payer: Medicare HMO

## 2017-09-18 DIAGNOSIS — E0590 Thyrotoxicosis, unspecified without thyrotoxic crisis or storm: Secondary | ICD-10-CM

## 2017-09-18 LAB — T4, FREE: Free T4: 0.5 ng/dL — ABNORMAL LOW (ref 0.60–1.60)

## 2017-09-18 LAB — T3, FREE: T3, Free: 2.4 pg/mL (ref 2.3–4.2)

## 2017-09-18 LAB — TSH: TSH: 20.79 u[IU]/mL — ABNORMAL HIGH (ref 0.35–4.50)

## 2017-09-18 NOTE — Telephone Encounter (Signed)
-----  Message from Philemon Kingdom, MD sent at 09/18/2017 12:37 PM EDT ----- Loma Sousa, can you please call pt: The TSH is still quite high.  Let us stop the methimazole completely and tell him to take the prednisone every other day for the next week and then stop.  Let us have him back in 1.5 months for a free T4, free T3, TSH.  Can you please order these and take the methimazole and prednisone off his medication list.

## 2017-09-23 ENCOUNTER — Ambulatory Visit: Payer: Medicare HMO | Admitting: Cardiology

## 2017-09-23 ENCOUNTER — Encounter: Payer: Self-pay | Admitting: Cardiology

## 2017-09-23 VITALS — BP 142/79 | HR 82 | Ht 72.0 in | Wt 180.6 lb

## 2017-09-23 DIAGNOSIS — I2510 Atherosclerotic heart disease of native coronary artery without angina pectoris: Secondary | ICD-10-CM | POA: Diagnosis not present

## 2017-09-23 DIAGNOSIS — I10 Essential (primary) hypertension: Secondary | ICD-10-CM

## 2017-09-23 DIAGNOSIS — I472 Ventricular tachycardia, unspecified: Secondary | ICD-10-CM

## 2017-09-23 DIAGNOSIS — I255 Ischemic cardiomyopathy: Secondary | ICD-10-CM | POA: Diagnosis not present

## 2017-09-23 DIAGNOSIS — J90 Pleural effusion, not elsewhere classified: Secondary | ICD-10-CM

## 2017-09-23 DIAGNOSIS — I481 Persistent atrial fibrillation: Secondary | ICD-10-CM | POA: Diagnosis not present

## 2017-09-23 DIAGNOSIS — E785 Hyperlipidemia, unspecified: Secondary | ICD-10-CM

## 2017-09-23 DIAGNOSIS — I5042 Chronic combined systolic (congestive) and diastolic (congestive) heart failure: Secondary | ICD-10-CM | POA: Diagnosis not present

## 2017-09-23 DIAGNOSIS — I4819 Other persistent atrial fibrillation: Secondary | ICD-10-CM

## 2017-09-23 MED ORDER — FUROSEMIDE 40 MG PO TABS: 40.0000 mg | ORAL_TABLET | Freq: Two times a day (BID) | ORAL | 3 refills | Status: DC

## 2017-09-23 MED ORDER — POTASSIUM CHLORIDE CRYS ER 20 MEQ PO TBCR: 20.0000 meq | EXTENDED_RELEASE_TABLET | Freq: Every day | ORAL | 3 refills | Status: DC

## 2017-09-23 NOTE — Patient Instructions (Addendum)
Medication Instructions:  Your physician recommends that you continue on your current medications as directed. Please refer to the Current Medication list given to you today.  If you need a refill on your cardiac medications, please contact your pharmacy first.  Labwork: Today for kidney function and magnesium level    Testing/Procedures: None ordered   Follow-Up: Your physician recommends that you schedule a follow-up appointment in: 3 months with Dr. Radford Pax   Any Other Special Instructions Will Be Listed Below (If Applicable).   Thank you for choosing [place], RN  [phone]  If you need a refill on your cardiac medications before your next appointment, please call your pharmacy.

## 2017-09-23 NOTE — Progress Notes (Signed)
Cardiology Office Note:    Date:  09/23/2017   ID:  [name], DOB 10/26/1932, MRN [id number]  PCP:  [name], MD  Cardiologist:  [name], MD    Referring MD: [name], MD   Chief Complaint  Patient presents with  . Congestive Heart Failure  . Cardiomyopathy  . Hypertension  . Atrial Fibrillation    History of Present Illness:    [name] is a 84 y.o. male with a hx of ischemic heart disease with chronic systolic heart failure, EF 25-30% status post CRT-D.  He has a history of coronary artery disease status post prior coronary artery bypass surgery as well as severe aortic stenosis status post pericardial tissue AVR in 2009.  He has a history of ventricular tachycardia and due to chronic dyspnea amiodarone was avoided and he was started on mexiletine.  He  had problems  with rapid weight loss associated with melena.  Workup at that time showed he was hyperthyroid with a TSH of less than 0.01 and was started on methimazole.    He was admitted on 04/2017 with N/anorexia and abdominal pain and  2D echocardiogram at that time showed a decline in EF to 20-25%.  He was found to be in acute on chronic systolic/diastolic heart failure and was diuresed with IV Lasix.  Repeat cardiac catheterization showed three-vessel coronary disease with stable revascularization with a patent LIMA-LAD, SVG-diagonal and SVG-RCA.  He was noted to have moderate pulmonary hypertension due to mixture of pulmonary venous and pulmonary arterial hypertension (WHO group 2 and 3) and normal cardiac output.  He was found to have a large right pleural effusion and underwent thoracentesis by interventional radiology of 2 L of fluid.  Repeat chest x-ray showed improvement in CHF with small bilateral pleural effusions.  It was recommended that he be sent to skilled nursing facility at discharge but patient refused.  He was discharged home on 06/04/2017.  He was seen by me in March 2019 shortly after  his hospitalization in February and was continued to have severe dyspnea on exertion and by exam and outpatient chest CT was noted to have reaccumulation of his pleural fluid.  BNP was also elevated and he was readmitted to the hospital.  He was seen by advanced heart failure service and diuresed with IV Lasix.  He underwent repeat thoracentesis of 2.1 L consistent with transudate.  He then had a repeat thoracentesis with 1.1 L removed the next day.  He had a PYP scan concerning for TTR amyloidosis with cardiac involvement.  He was seen back in CHF clinic in April with worsening shortness of breath, dizziness, orthopnea and a 4 pound weight gain in 24 hours.  He was referred to CVTS for Pleurx catheter.  On 07/26/2017 he underwent Pleurx catheter.  He was seen back by Dr. Darcey Nora on 09/11/2017 and there had not been any drainage from the catheter in 2 weeks.  Anticoagulation was held and on 09/13/2017 his Pleurx catheter was removed.  He has not had a repeat chest x-ray since then.  He is also had problems with a persistently high TSH and his methimazole was stopped and he was switched to prednisone every other day for a week which was then going to be stopped.  He is here today for followup and is doing well.  He denies any chest pain or pressure, SOB, DOE, PND, orthopnea, LE edema, dizziness, palpitations or syncope. He is compliant with his meds and is tolerating  meds with no SE.      Past Medical History:  Diagnosis Date  . Abnormal nuclear stress test: HIGH RISK 11/04/2014  . AICD (automatic cardioverter/defibrillator) present   . Arthritis    "minor everywhere" (04/22/2015)  . Asthma    "a touch"  . Barrett esophagus   . CAD (coronary artery disease)    a. s/p 2 vessel CABG with LIMA to LAD, SVG to diagonal 1, SVG to RCA 12/09.  . Cardiac resynchronization therapy defibrillator (CRT-D) in place   . Chronic anticoagulation 01/23/2013  . Chronic combined systolic and diastolic CHF (congestive  heart failure) (HCC)    dry weight 197-200lbs.  . Chronic combined systolic and diastolic heart failure ([place])   . CKD (chronic kidney disease) stage 3, GFR 30-59 ml/min (HCC) 08/23/2014  . CKD (chronic kidney disease), stage III ([place])   . Coronary artery disease 01/23/2013  . Diastolic dysfunction 16/01/9603  . Dyslipidemia 01/23/2013  . Dyspnea   . Essential hypertension 01/23/2013  . GERD (gastroesophageal reflux disease)   . GI bleed 05/08/2017  . H/O: rheumatic fever   . Hepatitis 1957   "don't know what kind:  . History of aortic valve replacement with bioprosthetic valve   . History of hiatal hernia   . History of PFTs    a. Amiodarone started 5/16 >> PFTs w/ DLCO 6/16:  FEV1 72% predicted, FEV1/FVC 66%, DLCO 66% >> minimal reversible obstructive airways disease with mild diffusion defect (suggestive of emphysema but absence of hyperinflation inconsistent with dx)  . HTN (hypertension)   . Hypercholesteremia   . Hyperkalemia 08/23/2014  . Hyperthyroidism 05/26/2017  . Leg weakness 01/28/2017  . Multiple thyroid nodules 08/13/2017  . NICM (nonischemic cardiomyopathy) ([place])    EF 15-20% by echo 2017  . Orthostatic hypotension   . PAF (paroxysmal atrial fibrillation) ([place])   . Persistent atrial fibrillation ([place]) 01/23/2013  . Pleural effusion on right 05/26/2017  . Pneumonia 08/2014   "dr thought I may have had a touch"  . Pre-diabetes   . Protein-calorie malnutrition, severe 05/27/2017  . S/P AVR (aortic valve replacement) 2009   a. severe AS s/p AVR with pericardial tissue valve 2009.  Marland Kitchen Shingles 08/23/2014  . Ventricular tachycardia ([place])   . VT (ventricular tachycardia) ([place]) 05/06/2017    Past Surgical History:  Procedure Laterality Date  . AORTIC VALVE REPLACEMENT  with 23-mm Magna Ease pericardial valve, model number   with 23-mm Magna Ease pericardial valve, model [id number], serial number [id number]   . APPENDECTOMY  1940s  . BI-VENTRICULAR IMPLANTABLE CARDIOVERTER  DEFIBRILLATOR  (CRT-D)  04/22/2015  . CARDIAC CATHETERIZATION N/A 11/04/2014   Procedure: Left Heart Cath and Cors/Grafts Angiography;  Surgeon: Leonie Man, MD;  Location: [facility];  Service: Cardiovascular;  Laterality: N/A;  . CARDIAC VALVE REPLACEMENT    . CARDIOVERSION N/A 01/27/2013   Procedure: CARDIOVERSION;  Surgeon: Sueanne Margarita, MD;  Location: [facility];  Service: Cardiovascular;  Laterality: N/A;  . CARDIOVERSION N/A 08/25/2014   Procedure: CARDIOVERSION;  Surgeon: Sanda Klein, MD;  Location: [facility];  Service: Cardiovascular;  Laterality: N/A;  . CATARACT EXTRACTION W/ INTRAOCULAR LENS  IMPLANT, BILATERAL Bilateral   . CHEST TUBE INSERTION Right 07/26/2017   Procedure: INSERTION PLEURAL DRAINAGE CATHETER - RIGHT;  Surgeon: Ivin Poot, MD;  Location: [facility];  Service: Thoracic;  Laterality: Right;  . CORONARY ARTERY BYPASS GRAFT  03/10/2008   x 3 Dr. Roxan Hockey  . EP IMPLANTABLE DEVICE N/A 04/22/2015  Procedure: BiV ICD Insertion CRT-D;  Surgeon: [name], MD;  Location: [facility];  Service: Cardiovascular;  Laterality: N/A;  . ESOPHAGOGASTRODUODENOSCOPY (EGD) WITH ESOPHAGEAL DILATION  X1  . ESOPHAGOGASTRODUODENOSCOPY (EGD) WITH PROPOFOL N/A 05/09/2017   Procedure: ESOPHAGOGASTRODUODENOSCOPY (EGD) WITH PROPOFOL;  Surgeon: Carol Ada, MD;  Location: [facility];  Service: Endoscopy;  Laterality: N/A;  . IR THORACENTESIS ASP PLEURAL SPACE W/IMG GUIDE  05/27/2017  . IR THORACENTESIS ASP PLEURAL SPACE W/IMG GUIDE  06/17/2017  . IR THORACENTESIS ASP PLEURAL SPACE W/IMG GUIDE  06/18/2017  . IR THORACENTESIS ASP PLEURAL SPACE W/IMG GUIDE  07/11/2017  . REMOVAL OF PLEURAL DRAINAGE CATHETER Right 09/13/2017   Procedure: REMOVAL OF PLEURAL DRAINAGE CATHETER;  Surgeon: Ivin Poot, MD;  Location: [facility];  Service: Thoracic;  Laterality: Right;  . RIGHT HEART CATH AND CORONARY/GRAFT ANGIOGRAPHY N/A 05/31/2017   Procedure: RIGHT HEART CATH AND  CORONARY/GRAFT ANGIOGRAPHY;  Surgeon: Jolaine Artist, MD;  Location: [facility];  Service: Cardiovascular;  Laterality: N/A;  . TONSILLECTOMY      Current Medications: Current Meds  Medication Sig  . albuterol (PROVENTIL HFA;VENTOLIN HFA) 108 (90 Base) MCG/ACT inhaler Inhale 1-2 puffs into the lungs every 6 (six) hours as needed for wheezing.   . Amino Acids-Protein Hydrolys (FEEDING SUPPLEMENT, PRO-STAT SUGAR FREE 64,) LIQD Take 30 mLs by mouth daily.  Marland Kitchen atorvastatin (LIPITOR) 20 MG tablet TAKE 1 TABLET (20 MG TOTAL) DAILY AT 6 PM.  . carvedilol (COREG) 12.5 MG tablet Take 6.25 mg by mouth 2 (two) times daily.   Marland Kitchen ELIQUIS 2.5 MG TABS tablet TAKE 1 TABLET TWICE DAILY  . feeding supplement, ENSURE ENLIVE, (ENSURE ENLIVE) LIQD Take 237 mLs by mouth 2 (two) times daily between meals.  . furosemide (LASIX) 40 MG tablet Take 1 tablet (40 mg total) by mouth 2 (two) times daily.  . hydrALAZINE (APRESOLINE) 25 MG tablet Take 12.5 mg by mouth 2 (two) times daily.  . isosorbide mononitrate (IMDUR) 30 MG 24 hr tablet TAKE 1 TABLET (30 MG TOTAL) BY MOUTH DAILY.  Marland Kitchen mexiletine (MEXITIL) 150 MG capsule Take 2 capsules (300 mg total) by mouth 3 (three) times daily.  . mirtazapine (REMERON) 15 MG tablet Take 1 tablet (15 mg total) by mouth at bedtime.  . Multiple Vitamin (MULTIVITAMIN WITH MINERALS) TABS tablet Take 1 tablet by mouth daily.   . Omega-3 Fatty Acids (FISH OIL) 1000 MG CAPS Take 2,000 mg by mouth 2 (two) times daily.  . potassium chloride SA (K-DUR,KLOR-CON) 20 MEQ tablet Take 1 tablet (20 mEq total) by mouth daily.  . ranitidine (ZANTAC) 300 MG tablet Take 300 mg by mouth at bedtime.  . traMADol (ULTRAM) 50 MG tablet Take 1 tablet (50 mg total) by mouth every 12 (twelve) hours as needed for moderate pain or severe pain.  . [DISCONTINUED] furosemide (LASIX) 40 MG tablet Take 1 tablet (40 mg total) by mouth 2 (two) times daily.  . [DISCONTINUED] potassium chloride SA (K-DUR,KLOR-CON)  20 MEQ tablet Take 1 tablet (20 mEq total) by mouth daily.     Allergies:   Novocain [procaine] and Amiodarone   Social History   Socioeconomic History  . Marital status: Married    Spouse name: Not on file  . Number of children: Not on file  . Years of education: Not on file  . Highest education level: Not on file  Occupational History  . Not on file  Social Needs  . Financial resource strain: Not on  file  . Food insecurity:    Worry: Not on file    Inability: Not on file  . Transportation needs:    Medical: Not on file    Non-medical: Not on file  Tobacco Use  . Smoking status: Never Smoker  . Smokeless tobacco: Never Used  Substance and Sexual Activity  . Alcohol use: Not Currently    Comment: 04/22/2015 "nothing since the 1980s; never had a problem w/it"  . Drug use: No  . Sexual activity: Never  Lifestyle  . Physical activity:    Days per week: Not on file    Minutes per session: Not on file  . Stress: Not on file  Relationships  . Social connections:    Talks on phone: Not on file    Gets together: Not on file    Attends religious service: Not on file    Active member of club or organization: Not on file    Attends meetings of clubs or organizations: Not on file    Relationship status: Not on file  Other Topics Concern  . Not on file  Social History Narrative   Married, lives with spouse [name]   4 children, 2 boys and 2 girls > one daughter passed   OCCUPATION: retired, Pension scheme manager for [employer]     Family History: The patient's family history includes Alzheimer's disease in his mother and unknown relative; COPD in his daughter and sister; Depression in his sister; Esophageal cancer in his sister; Heart disease in his sister; Hyperlipidemia in his sister; Hypertension in his sister.  ROS:   Please see the history of present illness.    ROS  All other systems reviewed and negative.   EKGs/Labs/Other Studies Reviewed:    The following  studies were reviewed today: Hospital notes over the past few months  EKG:  EKG is not ordered today.    Recent Labs: 06/14/2017: NT-Pro BNP 23,607 06/27/2017: Magnesium 2.4 07/11/2017: B Natriuretic Peptide 683.7 07/26/2017: ALT 44; BUN 18; Creatinine, Ser 1.26; Hemoglobin 11.4; Platelets 307; Potassium 3.9; Sodium 136 09/18/2017: TSH 20.79   Recent Lipid Panel    Component Value Date/Time   CHOL 126 07/30/2016 1143   CHOL 93 (L) 06/16/2014 0914   TRIG 154 (H) 07/30/2016 1143   TRIG 124 06/16/2014 0914   HDL 45 07/30/2016 1143   HDL 34 (L) 06/16/2014 0914   CHOLHDL 2.8 07/30/2016 1143   CHOLHDL 2.7 01/19/2015 1005   VLDL 32 (H) 01/19/2015 1005   LDLCALC 50 07/30/2016 1143   LDLCALC 34 06/16/2014 0914    Physical Exam:    VS:  BP (!) 142/79   Pulse 82   Ht 6' (1.829 m)   Wt 180 lb 9.6 oz (81.9 kg)   BMI 24.49 kg/m     Wt Readings from Last 3 Encounters:  09/23/17 180 lb 9.6 oz (81.9 kg)  09/13/17 170 lb (77.1 kg)  09/11/17 170 lb (77.1 kg)     GEN:  Well nourished, well developed in no acute distress HEENT: Normal NECK: No JVD; No carotid bruits LYMPHATICS: No lymphadenopathy CARDIAC: RRR, no murmurs, rubs, gallops RESPIRATORY:  Clear to auscultation without rales, wheezing or rhonchi  ABDOMEN: Soft, non-tender, non-distended MUSCULOSKELETAL:  No edema; No deformity  SKIN: Warm and dry NEUROLOGIC:  Alert and oriented x 3 PSYCHIATRIC:  Normal affect   ASSESSMENT:    1. Chronic combined systolic and diastolic heart failure ([place])   2. Coronary artery disease involving native coronary artery of  native heart without angina pectoris   3. Essential hypertension   4. Ischemic cardiomyopathy   5. Persistent atrial fibrillation ([place])   6. VT (ventricular tachycardia) ([place])   7. Pleural effusion on right   8. Dyslipidemia    PLAN:    In order of problems listed above:  1.  Chronic combined systolic/diastolic CHF -he is followed in advanced heart failure clinic.  He  had a cardiac catheterization done in February showing moderate pulmonary hypertension due to mixture of pulmonary venous and pulmonary arterial hypertension (WHO group 2 and 3) and normal cardiac output.  He appears euvolemic on exam today.  His weight is up but I think this is inaccurate because he says his weight at home has been normal and he has not gained any weight recently.  His shortness of breath is significantly improved after drainage of [place] effusion by Pleurx catheter.  He will continue on Lasix 40 mg twice daily.  Creatinine was stable at 1.26 on 07/26/2017.  He needs his Lasix and potassium refilled today which I will take care of and we will also repeat a bmet and magnesium.  2.  ASCAD - most recent cath showed showed three-vessel coronary disease with stable revascularization with a patent LIMA-LAD, SVG-diagonal and SVG-RCA.   He denies any anginal chest pain.  He is not on aspirin due to treatment with Eliquis for A. fib.  He will continue on Imdur 30 mg daily and carvedilol 6.25 mg twice daily.  3.  HTN - BP is well controlled on exam today.  He will continue on hydralazine 12.5 mg twice daily and carvedilol 6.25 mg twice daily.  4.  Ischemic DCM - EF 20 to 25% on 05/27/2017 with akinesis of the inferior lateral, inferior and inferior septal myocardium.  Likely related to TTR amyloidosis.  He is followed in advanced heart failure clinic.  Status post CRT-D followed in device clinic.  5.  Persistent atrial fibrillation -he is maintaining normal sinus rhythm on exam.  He will continue on carvedilol 12.5 mg twice daily and Eliquis 2.5 mg twice daily dosed for age greater than 31 and creatinine that is frequently above 1.5.  6.  VT -he has not had any further ventricular arrhythmias.  He will continue on mexiletine 300 mg 3 times daily and carvedilol.  He is followed by EP.  He had significant dyspnea on exertion on amiodarone which had been stopped.  7.  Right pleural effusion  -status  post multiple centesis followed by Pleurx catheter which was removed a week ago.  He is a follow-up with Dr. Prescott Gum on Wednesday.  8.  Dyslipidemia -LDL goal less than 70.  I will repeat an FLP and ALT today.  He will continue on atorvastatin 20 mg daily.   Medication Adjustments/Labs and Tests Ordered: Current medicines are reviewed at length with the patient today.  Concerns regarding medicines are outlined above.  Orders Placed This Encounter  Procedures  . Basic metabolic panel  . Magnesium   Meds ordered this encounter  Medications  . furosemide (LASIX) 40 MG tablet    Sig: Take 1 tablet (40 mg total) by mouth 2 (two) times daily.    Dispense:  180 tablet    Refill:  3  . potassium chloride SA (K-DUR,KLOR-CON) 20 MEQ tablet    Sig: Take 1 tablet (20 mEq total) by mouth daily.    Dispense:  90 tablet    Refill:  3    Signed, [name],  MD  09/23/2017 1:39 PM    [facility]

## 2017-09-24 ENCOUNTER — Telehealth: Payer: Self-pay

## 2017-09-24 DIAGNOSIS — Z79899 Other long term (current) drug therapy: Secondary | ICD-10-CM

## 2017-09-24 LAB — BASIC METABOLIC PANEL
BUN / CREAT RATIO: 16 (ref 10–24)
BUN: 33 mg/dL — ABNORMAL HIGH (ref 8–27)
CHLORIDE: 97 mmol/L (ref 96–106)
CO2: 23 mmol/L (ref 20–29)
Calcium: 9.1 mg/dL (ref 8.6–10.2)
Creatinine, Ser: 2.07 mg/dL — ABNORMAL HIGH (ref 0.76–1.27)
GFR calc Af Amer: 33 mL/min/{1.73_m2} — ABNORMAL LOW (ref >59)
GFR calc non Af Amer: 29 mL/min/{1.73_m2} — ABNORMAL LOW (ref >59)
GLUCOSE: 151 mg/dL — AB (ref 65–99)
POTASSIUM: 4.6 mmol/L (ref 3.5–5.2)
SODIUM: 136 mmol/L (ref 134–144)

## 2017-09-24 LAB — MAGNESIUM: Magnesium: 2.5 mg/dL — ABNORMAL HIGH (ref 1.6–2.3)

## 2017-09-24 MED ORDER — FUROSEMIDE 40 MG PO TABS: 40.0000 mg | ORAL_TABLET | Freq: Every day | ORAL | 3 refills | Status: DC

## 2017-09-24 NOTE — Telephone Encounter (Signed)
Notes recorded by Teressa Senter, RN on 09/24/2017 at 4:32 PM EDT Pt made aware of lab results. instructed pt to decrease Lasix to 40 mg daily due to elevated creatinine level and repeat BMET on Friday. He is in agreement with treatment plan and thankful for the call.  Notes recorded by Sueanne Margarita, MD on 09/24/2017 at 10:20 AM EDT Creatinine has increased. Please drop patient's Lasix back to 40 mg daily and repeat a bmet on Friday.

## 2017-09-25 ENCOUNTER — Ambulatory Visit (INDEPENDENT_AMBULATORY_CARE_PROVIDER_SITE_OTHER): Payer: Self-pay

## 2017-09-25 ENCOUNTER — Other Ambulatory Visit: Payer: Self-pay

## 2017-09-25 DIAGNOSIS — Z4802 Encounter for removal of sutures: Secondary | ICD-10-CM

## 2017-09-25 NOTE — Progress Notes (Signed)
Patient arrived for nurse visit to remove 2 sutures post- procedure removal of a PleurX drain 09/13/2017.  Sutures removed with no signs/ symptoms of infection noted.  Patient tolerated procedure well.  Patient instructed to keep the incision sites clean and dry.  Patient acknowledged instructions given.

## 2017-09-27 ENCOUNTER — Other Ambulatory Visit: Payer: Medicare HMO | Admitting: *Deleted

## 2017-09-27 DIAGNOSIS — Z79899 Other long term (current) drug therapy: Secondary | ICD-10-CM

## 2017-09-27 LAB — BASIC METABOLIC PANEL
BUN / CREAT RATIO: 13 (ref 10–24)
BUN: 22 mg/dL (ref 8–27)
CHLORIDE: 101 mmol/L (ref 96–106)
CO2: 23 mmol/L (ref 20–29)
Calcium: 9 mg/dL (ref 8.6–10.2)
Creatinine, Ser: 1.75 mg/dL — ABNORMAL HIGH (ref 0.76–1.27)
GFR calc Af Amer: 41 mL/min/{1.73_m2} — ABNORMAL LOW (ref >59)
GFR calc non Af Amer: 35 mL/min/{1.73_m2} — ABNORMAL LOW (ref >59)
GLUCOSE: 134 mg/dL — AB (ref 65–99)
Potassium: 5.1 mmol/L (ref 3.5–5.2)
Sodium: 139 mmol/L (ref 134–144)

## 2017-10-07 DIAGNOSIS — I479 Paroxysmal tachycardia, unspecified: Secondary | ICD-10-CM | POA: Diagnosis not present

## 2017-10-07 DIAGNOSIS — N183 Chronic kidney disease, stage 3 (moderate): Secondary | ICD-10-CM | POA: Diagnosis not present

## 2017-10-07 DIAGNOSIS — Z0000 Encounter for general adult medical examination without abnormal findings: Secondary | ICD-10-CM | POA: Diagnosis not present

## 2017-10-07 DIAGNOSIS — I4891 Unspecified atrial fibrillation: Secondary | ICD-10-CM | POA: Diagnosis not present

## 2017-10-07 DIAGNOSIS — E1122 Type 2 diabetes mellitus with diabetic chronic kidney disease: Secondary | ICD-10-CM | POA: Diagnosis not present

## 2017-10-07 DIAGNOSIS — E7800 Pure hypercholesterolemia, unspecified: Secondary | ICD-10-CM | POA: Diagnosis not present

## 2017-10-07 DIAGNOSIS — I255 Ischemic cardiomyopathy: Secondary | ICD-10-CM | POA: Diagnosis not present

## 2017-10-07 DIAGNOSIS — E1121 Type 2 diabetes mellitus with diabetic nephropathy: Secondary | ICD-10-CM | POA: Diagnosis not present

## 2017-10-07 DIAGNOSIS — I2510 Atherosclerotic heart disease of native coronary artery without angina pectoris: Secondary | ICD-10-CM | POA: Diagnosis not present

## 2017-10-07 DIAGNOSIS — I10 Essential (primary) hypertension: Secondary | ICD-10-CM | POA: Diagnosis not present

## 2017-10-07 DIAGNOSIS — I130 Hypertensive heart and chronic kidney disease with heart failure and stage 1 through stage 4 chronic kidney disease, or unspecified chronic kidney disease: Secondary | ICD-10-CM | POA: Diagnosis not present

## 2017-10-07 DIAGNOSIS — I509 Heart failure, unspecified: Secondary | ICD-10-CM | POA: Diagnosis not present

## 2017-10-14 ENCOUNTER — Telehealth: Payer: Self-pay

## 2017-10-14 NOTE — Telephone Encounter (Signed)
Spoke with pt regarding shock from this morning at 7am pt stated that he was out in the garden started feeling weak and sat down before he received the shock, pt stated he had taken all his prescribed medications at 6am including his mexiletine, coreg. Pt stated that he felt fine after shock, asked pt if he could make an 8am apt with Tommye Standard on 10/18/16 pt stated that he would see if he could get someone to bring him informed pt to call back if he needed to reschedule

## 2017-10-14 NOTE — Telephone Encounter (Signed)
Pt called stating he has been shocked by his ICD. He stated that he feels well and had not had any symptoms. I let him talk to the device tech nurse.

## 2017-10-14 NOTE — Telephone Encounter (Signed)
Routed to device triage

## 2017-10-15 DIAGNOSIS — I509 Heart failure, unspecified: Secondary | ICD-10-CM | POA: Diagnosis not present

## 2017-10-15 DIAGNOSIS — E1121 Type 2 diabetes mellitus with diabetic nephropathy: Secondary | ICD-10-CM | POA: Diagnosis not present

## 2017-10-15 DIAGNOSIS — N183 Chronic kidney disease, stage 3 (moderate): Secondary | ICD-10-CM | POA: Diagnosis not present

## 2017-10-15 DIAGNOSIS — I255 Ischemic cardiomyopathy: Secondary | ICD-10-CM | POA: Diagnosis not present

## 2017-10-15 DIAGNOSIS — I10 Essential (primary) hypertension: Secondary | ICD-10-CM | POA: Diagnosis not present

## 2017-10-15 DIAGNOSIS — K2270 Barrett's esophagus without dysplasia: Secondary | ICD-10-CM | POA: Diagnosis not present

## 2017-10-15 DIAGNOSIS — E7800 Pure hypercholesterolemia, unspecified: Secondary | ICD-10-CM | POA: Diagnosis not present

## 2017-10-15 DIAGNOSIS — I2510 Atherosclerotic heart disease of native coronary artery without angina pectoris: Secondary | ICD-10-CM | POA: Diagnosis not present

## 2017-10-15 DIAGNOSIS — I4891 Unspecified atrial fibrillation: Secondary | ICD-10-CM | POA: Diagnosis not present

## 2017-10-17 ENCOUNTER — Encounter: Payer: Self-pay | Admitting: Physician Assistant

## 2017-10-17 NOTE — Progress Notes (Signed)
Cardiology Office Note Date:  10/18/2017  Patient ID:  [name], [name] 1932/06/13, MRN [id number] PCP:  [name], MD  Cardiologist:  Dr. Radford Pax AHF: Dr. Haroldine Laws Electrophysiologist: Dr. Curt Bears    Chief Complaint: ICD shock  History of Present Illness: [name] is a 84 y.o. male with history of CAD/VHD s/p CABG/bioprosthetic AVR in 2009, chronic CHF (systolic), ICM, PAF, orthostatic hypotension, VT, ICD.  March 2019 required right thoras (x2), April another 2L drained Admitted 3/9 -> 06/20/17 with recurrent pleural effusion and markedly elevated BNP. Underwent Thoracentesis x 2. Had NSVT up to 40 beats, electrolytes replaced aggressively. PYP scan 06/20/17 NOT suggestive of TTR amyloid)(and eventually placement of Pleurex catheter (Dr. Nils Pyle) and removed June, CKD (III).  He comes in today to be seen for Dr. Curt Bears 2/2 to having been shocked by his ICD 10/14/17.  He last saw Dr. Curt Bears march of this year.  He discussed hx of VT below detection previously and device programmed.  Has had WCT to 110 range felt most likely to have been VT during a hospitalization, his mexiletine was adjusted.  Phone notes in April note appropriate ATP, was recommended to increase his mexiletine to 375m TID, [facility], phone note reports he had not yet increased the mexiletine and was again advised to do so.  He last saw Dr. TRadford Paxlast month, felt to be doing well from a CAD standpoint, though mentioned he was being followed for thyroid instability.  04/22/17 saw pulmonary, recs were to f/u CT, ? Unclear if felt he had amio tox.  CT in [place] discussed f/u on nodules, no discussion or mention of any fibrosis.  He was initially treated however with a steroid taper regime, is also seeing endocrinology the patient reports this is as well felt to be 2/2 the amiodarone, following with Dr. GJimmye Norman and at his last visit was "OK".  The patient states of late he has felt well, no CP,  palpitations or unusual SOB.  He has some mild DOE at baseline. Has not had any PND or orthopnea symptoms.   He does not have the impresion that he is retaining fluid, but admits he has been fairly liberal of late with his diet and fluid intake.  He is taking the mexiletine 3028mTID and all of his medicines as prescribed.   The day he got shocked he was working in his garden, doing fairly heavy work diContractor He did feel tired, and had to take a couple breaks with the work that he attributed to the heavier work.  He decided to go inside and take a break and while getting his his cart he was shocked.  He denies feeling near syncopal or having any particular symptom or warning.  He felt fine afterwards as well.  He has felt well since, no further shocks.  He has hx of orthostatic symptoms, these have been controlled and not a bothersome issue for him in some time, he states he is aware of this tendency and gets up slowly.  No falls.  No bleeding or signs of bleeding with his Eliquis.  He mentions mid-day his BP is usually low 100's, otherwise AM, evening are better like today's.  He does not drive outside of just around his house and on his cart on his property.  We discuseed [place] law, no driving 6 months.   Device information MDT CRT-D, implanted1/13/17 + hx of appropriate therapy and VT AAD Hx:  Amiodarone stopped Jan 2019, 2/2 SOB,  No clear finding of amio tox >> mexiletine   Past Medical History:  Diagnosis Date  . Abnormal nuclear stress test: HIGH RISK 11/04/2014  . AICD (automatic cardioverter/defibrillator) present   . Arthritis    "minor everywhere" (04/22/2015)  . Asthma    "a touch"  . Barrett esophagus   . CAD (coronary artery disease)    a. s/p 2 vessel CABG with LIMA to LAD, SVG to diagonal 1, SVG to RCA 12/09.  . Cardiac resynchronization therapy defibrillator (CRT-D) in place   . Chronic anticoagulation 01/23/2013  . Chronic combined systolic and diastolic CHF  (congestive heart failure) (HCC)    dry weight 197-200lbs.  . Chronic combined systolic and diastolic heart failure ([place])   . CKD (chronic kidney disease) stage 3, GFR 30-59 ml/min (HCC) 08/23/2014  . CKD (chronic kidney disease), stage III ([place])   . Coronary artery disease 01/23/2013  . Diastolic dysfunction 21/30/8657  . Dyslipidemia 01/23/2013  . Dyspnea   . Essential hypertension 01/23/2013  . GERD (gastroesophageal reflux disease)   . GI bleed 05/08/2017  . H/O: rheumatic fever   . Hepatitis 1957   "don't know what kind:  . History of aortic valve replacement with bioprosthetic valve   . History of hiatal hernia   . History of PFTs    a. Amiodarone started 5/16 >> PFTs w/ DLCO 6/16:  FEV1 72% predicted, FEV1/FVC 66%, DLCO 66% >> minimal reversible obstructive airways disease with mild diffusion defect (suggestive of emphysema but absence of hyperinflation inconsistent with dx)  . HTN (hypertension)   . Hypercholesteremia   . Hyperkalemia 08/23/2014  . Hyperthyroidism 05/26/2017  . Leg weakness 01/28/2017  . Multiple thyroid nodules 08/13/2017  . NICM (nonischemic cardiomyopathy) ([place])    EF 15-20% by echo 2017  . Orthostatic hypotension   . PAF (paroxysmal atrial fibrillation) ([place])   . Persistent atrial fibrillation ([place]) 01/23/2013  . Pleural effusion on right 05/26/2017  . Pneumonia 08/2014   "dr thought I may have had a touch"  . Pre-diabetes   . Protein-calorie malnutrition, severe 05/27/2017  . S/P AVR (aortic valve replacement) 2009   a. severe AS s/p AVR with pericardial tissue valve 2009.  Marland Kitchen Shingles 08/23/2014  . Ventricular tachycardia ([place])   . VT (ventricular tachycardia) ([place]) 05/06/2017    Past Surgical History:  Procedure Laterality Date  . AORTIC VALVE REPLACEMENT  with 23-mm Magna Ease pericardial valve, model number   with 23-mm Magna Ease pericardial valve, model [id number], serial number [id number]   . APPENDECTOMY  1940s  . BI-VENTRICULAR IMPLANTABLE  CARDIOVERTER DEFIBRILLATOR  (CRT-D)  04/22/2015  . CARDIAC CATHETERIZATION N/A 11/04/2014   Procedure: Left Heart Cath and Cors/Grafts Angiography;  Surgeon: Leonie Man, MD;  Location: [facility];  Service: Cardiovascular;  Laterality: N/A;  . CARDIAC VALVE REPLACEMENT    . CARDIOVERSION N/A 01/27/2013   Procedure: CARDIOVERSION;  Surgeon: Sueanne Margarita, MD;  Location: [facility];  Service: Cardiovascular;  Laterality: N/A;  . CARDIOVERSION N/A 08/25/2014   Procedure: CARDIOVERSION;  Surgeon: Sanda Klein, MD;  [facility];  Service: Cardiovascular;  Laterality: N/A;  . CATARACT EXTRACTION W/ INTRAOCULAR LENS  IMPLANT, BILATERAL Bilateral   . CHEST TUBE INSERTION Right 07/26/2017   Procedure: INSERTION PLEURAL DRAINAGE CATHETER - RIGHT;  Surgeon: Ivin Poot, MD;  Location: [facility];  Service: Thoracic;  Laterality: Right;  . CORONARY ARTERY BYPASS GRAFT  03/10/2008   x 3 Dr.  Hendrickson  . EP IMPLANTABLE DEVICE N/A 04/22/2015   Procedure: BiV ICD Insertion CRT-D;  Surgeon: [name], MD;  Location: [facility];  Service: Cardiovascular;  Laterality: N/A;  . ESOPHAGOGASTRODUODENOSCOPY (EGD) WITH ESOPHAGEAL DILATION  X1  . ESOPHAGOGASTRODUODENOSCOPY (EGD) WITH PROPOFOL N/A 05/09/2017   Procedure: ESOPHAGOGASTRODUODENOSCOPY (EGD) WITH PROPOFOL;  Surgeon: Carol Ada, MD;  Location: [facility];  Service: Endoscopy;  Laterality: N/A;  . IR THORACENTESIS ASP PLEURAL SPACE W/IMG GUIDE  05/27/2017  . IR THORACENTESIS ASP PLEURAL SPACE W/IMG GUIDE  06/17/2017  . IR THORACENTESIS ASP PLEURAL SPACE W/IMG GUIDE  06/18/2017  . IR THORACENTESIS ASP PLEURAL SPACE W/IMG GUIDE  07/11/2017  . REMOVAL OF PLEURAL DRAINAGE CATHETER Right 09/13/2017   Procedure: REMOVAL OF PLEURAL DRAINAGE CATHETER;  Surgeon: Ivin Poot, MD;  Location: [facility];  Service: Thoracic;  Laterality: Right;  . RIGHT HEART CATH AND CORONARY/GRAFT ANGIOGRAPHY N/A 05/31/2017   Procedure: RIGHT  HEART CATH AND CORONARY/GRAFT ANGIOGRAPHY;  Surgeon: Jolaine Artist, MD;  Location: [facility];  Service: Cardiovascular;  Laterality: N/A;  . TONSILLECTOMY      Current Outpatient Medications  Medication Sig Dispense Refill  . albuterol (PROVENTIL HFA;VENTOLIN HFA) 108 (90 Base) MCG/ACT inhaler Inhale 1-2 puffs into the lungs every 6 (six) hours as needed for wheezing.     . Amino Acids-Protein Hydrolys (FEEDING SUPPLEMENT, PRO-STAT SUGAR FREE 64,) LIQD Take 30 mLs by mouth daily. 900 mL 0  . atorvastatin (LIPITOR) 20 MG tablet TAKE 1 TABLET (20 MG TOTAL) DAILY AT 6 PM. 90 tablet 3  . carvedilol (COREG) 12.5 MG tablet Take 6.25 mg by mouth 2 (two) times daily.     Marland Kitchen ELIQUIS 2.5 MG TABS tablet TAKE 1 TABLET TWICE DAILY 180 tablet 0  . feeding supplement, ENSURE ENLIVE, (ENSURE ENLIVE) LIQD Take 237 mLs by mouth 2 (two) times daily between meals. 60 Bottle 0  . furosemide (LASIX) 40 MG tablet Take 1 tablet (40 mg total) by mouth daily. 90 tablet 3  . hydrALAZINE (APRESOLINE) 25 MG tablet Take 12.5 mg by mouth 2 (two) times daily.    . isosorbide mononitrate (IMDUR) 30 MG 24 hr tablet TAKE 1 TABLET (30 MG TOTAL) BY MOUTH DAILY. 90 tablet 3  . mexiletine (MEXITIL) 150 MG capsule Take 2 capsules (300 mg total) by mouth 3 (three) times daily. 180 capsule 11  . mirtazapine (REMERON) 15 MG tablet Take 1 tablet (15 mg total) by mouth at bedtime. 30 tablet 0  . Multiple Vitamin (MULTIVITAMIN WITH MINERALS) TABS tablet Take 1 tablet by mouth daily.     . Omega-3 Fatty Acids (FISH OIL) 1000 MG CAPS Take 2,000 mg by mouth 2 (two) times daily.    . potassium chloride SA (K-DUR,KLOR-CON) 20 MEQ tablet Take 1 tablet (20 mEq total) by mouth daily. 90 tablet 3  . ranitidine (ZANTAC) 300 MG tablet Take 300 mg by mouth at bedtime.    . traMADol (ULTRAM) 50 MG tablet Take 1 tablet (50 mg total) by mouth every 12 (twelve) hours as needed for moderate pain or severe pain. 20 tablet 0   No current  facility-administered medications for this visit.     Allergies:   Novocain [procaine] and Amiodarone   Social History:  The patient  reports that he has never smoked. He has never used smokeless tobacco. He reports that he drank alcohol. He reports that he does not use drugs.   Family History:  The patient's family history  includes Alzheimer's disease in his mother and unknown relative; COPD in his daughter and sister; Depression in his sister; Esophageal cancer in his sister; Heart disease in his sister; Hyperlipidemia in his sister; Hypertension in his sister.  ROS:  Please see the history of present illness.   All other systems are reviewed and otherwise negative.   PHYSICAL EXAM:  VS:  BP (!) 144/82   Pulse 71   Ht 6' (1.829 m)   Wt 185 lb (83.9 kg)   BMI 25.09 kg/m  BMI: Body mass index is 25.09 kg/m. Well nourished, well developed, in no acute distress  HEENT: normocephalic, atraumatic  Neck: no JVD, carotid bruits or masses Cardiac:  RRR; no significant murmurs, no rubs, or gallops Lungs:  CTA b/l, no wheezing, rhonchi or rales  Abd: soft, nontender, MS: no deformity, age appropriate atrophy Ext:  no edema  Skin: warm and dry, no rash Neuro:  No gross deficits appreciated Psych: euthymic mood, full affect  ICD site is stable, no tethering or discomfort   EKG:  Not done today ICD interrogation done today and reviewed by myself:  Battery and lead measurements are good.  97% VP VT episodes are reviewed at length with MDT rep as well as Dr. Lovena Le.  He does have some VT, the episode that he was shocked for was likely double tachycardia with an AT/VT.     05/31/17; R/LHC Assessment: 1. 3v CAD with stable revascularization with patent LIMA-LAD, SVG-diagonal and SVG-RCA 2. 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 Normal cardiac output - no evidence of thyrotoxicosis heart disease or PH  05/27/17: TTE Study  Conclusions - Left ventricle: The cavity size was normal. Wall thickness was   increased in a pattern of mild LVH. Systolic function was   severely reduced. The estimated ejection fraction was in the   range of 20% to 25%. There is akinesis of the inferolateral,   inferior, and inferoseptal myocardium. Features are consistent   with a pseudonormal left ventricular filling pattern, with   concomitant abnormal relaxation and increased filling pressure   (grade 2 diastolic dysfunction). - Aortic valve: A pericardial tissue valve bioprosthesis was   present and functioning normally. Peak velocity (S): 226 cm/s.   Mean gradient (S): 10 mm Hg. Valve area (VTI): 1.52 cm^2. Valve   area (Vmax): 1.5 cm^2. Valve area (Vmean): 1.59 cm^2. - Mitral valve: Calcified annulus. Mildly thickened leaflets .   There was mild regurgitation. - Left atrium: The atrium was severely dilated. - Right ventricle: Pacer wire or catheter noted in right ventricle. - Pulmonary arteries: Systolic pressure was mildly increased. PA   peak pressure: 35 mm Hg (S). Impressions: - Compared to the prior study, there has been no significant   interval change.  Recent Labs: 06/14/2017: NT-Pro BNP 23,607 07/11/2017: B Natriuretic Peptide 683.7 07/26/2017: ALT 44; Hemoglobin 11.4; Platelets 307 09/18/2017: TSH 20.79 09/23/2017: Magnesium 2.5 09/27/2017: BUN 22; Creatinine, Ser 1.75; Potassium 5.1; Sodium 139  No results found for requested labs within last 8760 hours.   Estimated Creatinine Clearance: 35.7 mL/min (A) (by C-G formula based on SCr of 1.75 mg/dL (H)).   Wt Readings from Last 3 Encounters:  10/18/17 185 lb (83.9 kg)  09/23/17 180 lb 9.6 oz (81.9 kg)  09/13/17 170 lb (77.1 kg)     Other studies reviewed: Additional studies/records reviewed today include: summarized above  ASSESSMENT AND PLAN:  1. VT 2. ICD  Intact device function  He has some clear VT The episode he was shocked for is difficult and  likely a double tachycardia with an AT/VT ATP x3 was unsuccessful, shock x1 was  In d/w Dr. Lovena Le, will increase his coreg and have him see Dr. Curt Bears to discuss perhaps ablation.  Did the AT provoke the VT?  VT ablation?  Will get BMET and mag level today.  I discussed at length with the patient monitoring for orthostatic, hypotension symptoms.  We can consider holding his Imdur or apresoline to allow for uptitration of is BB, though his CHF has been doing well.  I have instructed the patient no driving for 6 months  3. CAD     No anginal complaints     C/w Dr. Radford Pax, last cath with patient bypasses, feb this year  4. ICM     Follows w/AHF team     No on ACE/ARB 2/2 renal insuff     Hx of recurrent L pleural effusions, pleurex cath removed last month  Optivol suggests fluid accumulation, weight is up a few pounds, exam appears euvolemic. discussed his sodium intake, fluid intake balance.   He will take his lasix BID today and reign in his diet      5. VAD Bioprosthetic AVR     Functioning well by TTE in Feb  6. Hx of orthostatic hypotension     No symptoms of late     I did sitting/standing BP while with him, no symptoms, no significant change in BP  7. Paroxysmal AFib     CHA2DS2Vasc is 5, on Eliquis, appropriately dosed for age/renal insuff     no AF on device interrogation        Disposition: F/u with Dr. Curt Bears in 2-3 weeks, as discussed above  Current medicines are reviewed at length with the patient today.  The patient did not have any concerns regarding medicines.  Venetia Night, PA-C 10/18/2017 8:50 AM     [facility] [address] [place] [address] [phone] (office)  [phone] (fax)

## 2017-10-18 ENCOUNTER — Ambulatory Visit: Payer: Medicare HMO | Admitting: Physician Assistant

## 2017-10-18 VITALS — BP 144/82 | HR 71 | Ht 72.0 in | Wt 185.0 lb

## 2017-10-18 DIAGNOSIS — I255 Ischemic cardiomyopathy: Secondary | ICD-10-CM

## 2017-10-18 DIAGNOSIS — I472 Ventricular tachycardia, unspecified: Secondary | ICD-10-CM

## 2017-10-18 DIAGNOSIS — Z952 Presence of prosthetic heart valve: Secondary | ICD-10-CM

## 2017-10-18 DIAGNOSIS — Z79899 Other long term (current) drug therapy: Secondary | ICD-10-CM

## 2017-10-18 DIAGNOSIS — I951 Orthostatic hypotension: Secondary | ICD-10-CM

## 2017-10-18 DIAGNOSIS — I480 Paroxysmal atrial fibrillation: Secondary | ICD-10-CM | POA: Diagnosis not present

## 2017-10-18 DIAGNOSIS — Z95810 Presence of automatic (implantable) cardiac defibrillator: Secondary | ICD-10-CM

## 2017-10-18 DIAGNOSIS — I2510 Atherosclerotic heart disease of native coronary artery without angina pectoris: Secondary | ICD-10-CM

## 2017-10-18 LAB — BASIC METABOLIC PANEL
BUN / CREAT RATIO: 14 (ref 10–24)
BUN: 25 mg/dL (ref 8–27)
CHLORIDE: 99 mmol/L (ref 96–106)
CO2: 23 mmol/L (ref 20–29)
Calcium: 9.3 mg/dL (ref 8.6–10.2)
Creatinine, Ser: 1.76 mg/dL — ABNORMAL HIGH (ref 0.76–1.27)
GFR calc non Af Amer: 35 mL/min/{1.73_m2} — ABNORMAL LOW (ref >59)
GFR, EST AFRICAN AMERICAN: 41 mL/min/{1.73_m2} — AB (ref >59)
Glucose: 97 mg/dL (ref 65–99)
POTASSIUM: 5.5 mmol/L — AB (ref 3.5–5.2)
SODIUM: 138 mmol/L (ref 134–144)

## 2017-10-18 LAB — MAGNESIUM: Magnesium: 2.5 mg/dL — ABNORMAL HIGH (ref 1.6–2.3)

## 2017-10-18 MED ORDER — CARVEDILOL 12.5 MG PO TABS: 12.5000 mg | ORAL_TABLET | Freq: Two times a day (BID) | ORAL | 5 refills | Status: DC

## 2017-10-18 NOTE — Patient Instructions (Addendum)
Medication Instructions:   STARTING TOMORROW TAKE CARVEDILOL 12.5 MG TWICE A DAY  TODAY ONLY TAKE LASIX TWICE DAY THEN RESUME TO REGULAR DOSE   If you need a refill on your cardiac medications before your next appointment, please call your pharmacy.  Labwork:  BMET AND MAG TODAY    Testing/Procedures: NONE ORDERED  TODAY    Follow-Up:  WITH DR CAMNITZ IN 2 WEEK S (CONTACT NURSE FOR SCHEDULE CONFLICTS)   Remote monitoring is used to monitor your Pacemaker of ICD from home. This monitoring reduces the number of office visits required to check your device to one time per year. It allows [place] to keep an eye on the functioning of your device to ensure it is working properly. You are scheduled for a device check from home on . 11-11-17 You may send your transmission at any time that day. If you have a wireless device, the transmission will be sent automatically. After your physician reviews your transmission, you will receive a postcard with your next transmission date.     Any Other Special Instructions Will Be Listed Below (If Applicable).  YOU HAVE BEEN RECOMMENDED FOR NO DRIVING FOR 6 MONTHS    MONITOR BLOOD PRESSURE IF LESS THAN A 100 TOP NUMBER  WITH SYMPTOMS UPON STANDING TO CALL CLINIC TO  SEE  ABOUT ADJUSTMENT OF  MEDICATIONS.

## 2017-10-22 ENCOUNTER — Other Ambulatory Visit: Payer: Medicare HMO

## 2017-10-22 ENCOUNTER — Other Ambulatory Visit: Payer: Self-pay

## 2017-10-22 DIAGNOSIS — E875 Hyperkalemia: Secondary | ICD-10-CM | POA: Diagnosis not present

## 2017-10-22 LAB — BASIC METABOLIC PANEL WITH GFR
BUN/Creatinine Ratio: 14 (ref 10–24)
BUN: 26 mg/dL (ref 8–27)
CO2: 21 mmol/L (ref 20–29)
Calcium: 9.1 mg/dL (ref 8.6–10.2)
Chloride: 100 mmol/L (ref 96–106)
Creatinine, Ser: 1.86 mg/dL — ABNORMAL HIGH (ref 0.76–1.27)
GFR calc Af Amer: 38 mL/min/1.73 — ABNORMAL LOW
GFR calc non Af Amer: 33 mL/min/1.73 — ABNORMAL LOW
Glucose: 118 mg/dL — ABNORMAL HIGH (ref 65–99)
Potassium: 4.7 mmol/L (ref 3.5–5.2)
Sodium: 138 mmol/L (ref 134–144)

## 2017-10-25 ENCOUNTER — Telehealth: Payer: Self-pay | Admitting: *Deleted

## 2017-10-25 NOTE — Telephone Encounter (Signed)
-----  Message from Renee Lynn Ursuy, PA-C sent at 10/22/2017  4:59 PM EDT ----- K+ is better, continue to take the potassium every other day for now.  Keep follow up with Dr. Camnitz as scheduled.  Thanks renee 

## 2017-10-25 NOTE — Telephone Encounter (Signed)
LMOVM TO CALL BACK FOR RESULTS

## 2017-11-04 ENCOUNTER — Ambulatory Visit: Payer: Medicare HMO | Admitting: Cardiology

## 2017-11-04 ENCOUNTER — Encounter: Payer: Self-pay | Admitting: Cardiology

## 2017-11-04 ENCOUNTER — Encounter (INDEPENDENT_AMBULATORY_CARE_PROVIDER_SITE_OTHER): Payer: Self-pay

## 2017-11-04 VITALS — BP 142/76 | HR 70 | Ht 72.0 in | Wt 186.0 lb

## 2017-11-04 DIAGNOSIS — I2510 Atherosclerotic heart disease of native coronary artery without angina pectoris: Secondary | ICD-10-CM | POA: Diagnosis not present

## 2017-11-04 DIAGNOSIS — I472 Ventricular tachycardia, unspecified: Secondary | ICD-10-CM

## 2017-11-04 DIAGNOSIS — I480 Paroxysmal atrial fibrillation: Secondary | ICD-10-CM

## 2017-11-04 DIAGNOSIS — I5022 Chronic systolic (congestive) heart failure: Secondary | ICD-10-CM | POA: Diagnosis not present

## 2017-11-04 NOTE — Progress Notes (Signed)
Electrophysiology Office Note   Date:  11/04/2017   ID:  [name], DOB [birth year], MRN [id number]  PCP:  [name], MD  Cardiologist:  [name] Primary Electrophysiologist: Hlee Fringer Meredith Leeds, MD    Chief Complaint  Patient presents with  . Defib Check    Ventricular tachycardia/Chronic combined systolic and diastolic CHF/PAF     History of Present Illness: [name] is a 84 y.o. male who presents today for electrophysiology evaluation.   He has a history of hypertension, aortic stenosis status post AVR, chronic combined systolic and diastolic CHF in the setting of A. fib with RVR, orthostatic hypotension, dilated cardiomyopathy EF 25-30%, status post CABG, and paroxysmal atrial fibrillation.  He does have paroxysmal atrial fibrillation has been cardioverted in the past and is now on a fixed within with rhythm control via amiodarone. He had CRT-D upgrade on 04/22/15.  He was having VT below his detection limit.  Device limits were changed in the last visit and he was put on mexiletine.  He was recently hospitalized for systolic heart failure with aggressive diuresis and thoracentesis.  He had a wide-complex tachycardia during hospitalization at a rate of approximately 110 bpm.  Today, denies symptoms of palpitations, chest pain, shortness of breath, orthopnea, PND, lower extremity edema, claudication, dizziness, presyncope, syncope, bleeding, or neurologic sequela. The patient is tolerating medications without difficulties.  Overall he is doing well.  He is currently asymptomatic.  He is able to do all of his daily activities.  He did have one episode of ventricular tachycardia on 10/18/2017 that resulted in an ICD shock.  Past Medical History:  Diagnosis Date  . Abnormal nuclear stress test: HIGH RISK 11/04/2014  . AICD (automatic cardioverter/defibrillator) present   . Arthritis    "minor everywhere" (04/22/2015)  . Asthma    "a touch"  . Barrett esophagus   .  CAD (coronary artery disease)    a. s/p 2 vessel CABG with LIMA to LAD, SVG to diagonal 1, SVG to RCA 12/09.  . Cardiac resynchronization therapy defibrillator (CRT-D) in place   . Chronic anticoagulation 01/23/2013  . Chronic combined systolic and diastolic CHF (congestive heart failure) (HCC)    dry weight 197-200lbs.  . Chronic combined systolic and diastolic heart failure ([place])   . CKD (chronic kidney disease) stage 3, GFR 30-59 ml/min (HCC) 08/23/2014  . CKD (chronic kidney disease), stage III ([place])   . Coronary artery disease 01/23/2013  . Diastolic dysfunction 35/45/6256  . Dyslipidemia 01/23/2013  . Dyspnea   . Essential hypertension 01/23/2013  . GERD (gastroesophageal reflux disease)   . GI bleed 05/08/2017  . H/O: rheumatic fever   . Hepatitis 1957   "don't know what kind:  . History of aortic valve replacement with bioprosthetic valve   . History of hiatal hernia   . History of PFTs    a. Amiodarone started 5/16 >> PFTs w/ DLCO 6/16:  FEV1 72% predicted, FEV1/FVC 66%, DLCO 66% >> minimal reversible obstructive airways disease with mild diffusion defect (suggestive of emphysema but absence of hyperinflation inconsistent with dx)  . HTN (hypertension)   . Hypercholesteremia   . Hyperkalemia 08/23/2014  . Hyperthyroidism 05/26/2017  . Leg weakness 01/28/2017  . Multiple thyroid nodules 08/13/2017  . NICM (nonischemic cardiomyopathy) ([place])    EF 15-20% by echo 2017  . Orthostatic hypotension   . PAF (paroxysmal atrial fibrillation) ([place])   . Persistent atrial fibrillation ([place]) 01/23/2013  . Pleural effusion on right 05/26/2017  .  Pneumonia 08/2014   "dr thought I may have had a touch"  . Pre-diabetes   . Protein-calorie malnutrition, severe 05/27/2017  . S/P AVR (aortic valve replacement) 2009   a. severe AS s/p AVR with pericardial tissue valve 2009.  Marland Kitchen Shingles 08/23/2014  . Ventricular tachycardia ([place])   . VT (ventricular tachycardia) ([place]) 05/06/2017   Past Surgical  History:  Procedure Laterality Date  . AORTIC VALVE REPLACEMENT  with 23-mm Magna Ease pericardial valve, model number   with 23-mm Magna Ease pericardial valve, model [id number], serial number [id number]   . APPENDECTOMY  1940s  . BI-VENTRICULAR IMPLANTABLE CARDIOVERTER DEFIBRILLATOR  (CRT-D)  04/22/2015  . CARDIAC CATHETERIZATION N/A 11/04/2014   Procedure: Left Heart Cath and Cors/Grafts Angiography;  Surgeon: Leonie Man, MD;  Location: [facility];  Service: Cardiovascular;  Laterality: N/A;  . CARDIAC VALVE REPLACEMENT    . CARDIOVERSION N/A 01/27/2013   Procedure: CARDIOVERSION;  Surgeon: Sueanne Margarita, MD;  Location: [facility];  Service: Cardiovascular;  Laterality: N/A;  . CARDIOVERSION N/A 08/25/2014   Procedure: CARDIOVERSION;  Surgeon: Sanda Klein, MD;  Location: [facility];  Service: Cardiovascular;  Laterality: N/A;  . CATARACT EXTRACTION W/ INTRAOCULAR LENS  IMPLANT, BILATERAL Bilateral   . CHEST TUBE INSERTION Right 07/26/2017   Procedure: INSERTION PLEURAL DRAINAGE CATHETER - RIGHT;  Surgeon: Ivin Poot, MD;  Location: [facility];  Service: Thoracic;  Laterality: Right;  . CORONARY ARTERY BYPASS GRAFT  03/10/2008   x 3 Dr. Roxan Hockey  . EP IMPLANTABLE DEVICE N/A 04/22/2015   Procedure: BiV ICD Insertion CRT-D;  Surgeon: Riely Baskett Meredith Leeds, MD;  Location: [facility];  Service: Cardiovascular;  Laterality: N/A;  . ESOPHAGOGASTRODUODENOSCOPY (EGD) WITH ESOPHAGEAL DILATION  X1  . ESOPHAGOGASTRODUODENOSCOPY (EGD) WITH PROPOFOL N/A 05/09/2017   Procedure: ESOPHAGOGASTRODUODENOSCOPY (EGD) WITH PROPOFOL;  Surgeon: Carol Ada, MD;  Location: [facility];  Service: Endoscopy;  Laterality: N/A;  . IR THORACENTESIS ASP PLEURAL SPACE W/IMG GUIDE  05/27/2017  . IR THORACENTESIS ASP PLEURAL SPACE W/IMG GUIDE  06/17/2017  . IR THORACENTESIS ASP PLEURAL SPACE W/IMG GUIDE  06/18/2017  . IR THORACENTESIS ASP PLEURAL SPACE W/IMG GUIDE  07/11/2017  . REMOVAL OF PLEURAL  DRAINAGE CATHETER Right 09/13/2017   Procedure: REMOVAL OF PLEURAL DRAINAGE CATHETER;  Surgeon: Ivin Poot, MD;  Location: [facility];  Service: Thoracic;  Laterality: Right;  . RIGHT HEART CATH AND CORONARY/GRAFT ANGIOGRAPHY N/A 05/31/2017   Procedure: RIGHT HEART CATH AND CORONARY/GRAFT ANGIOGRAPHY;  Surgeon: Jolaine Artist, MD;  Location: [facility];  Service: Cardiovascular;  Laterality: N/A;  . TONSILLECTOMY       Current Outpatient Medications  Medication Sig Dispense Refill  . albuterol (PROVENTIL HFA;VENTOLIN HFA) 108 (90 Base) MCG/ACT inhaler Inhale 1-2 puffs into the lungs every 6 (six) hours as needed for wheezing.     . Amino Acids-Protein Hydrolys (FEEDING SUPPLEMENT, PRO-STAT SUGAR FREE 64,) LIQD Take 30 mLs by mouth daily. 900 mL 0  . atorvastatin (LIPITOR) 20 MG tablet TAKE 1 TABLET (20 MG TOTAL) DAILY AT 6 PM. 90 tablet 3  . carvedilol (COREG) 12.5 MG tablet Take 1 tablet (12.5 mg total) by mouth 2 (two) times daily. 60 tablet 5  . ELIQUIS 2.5 MG TABS tablet TAKE 1 TABLET TWICE DAILY 180 tablet 0  . feeding supplement, ENSURE ENLIVE, (ENSURE ENLIVE) LIQD Take 237 mLs by mouth 2 (two) times daily between meals. 60 Bottle 0  . furosemide (LASIX) 40 MG tablet Take 1  tablet (40 mg total) by mouth daily. 90 tablet 3  . hydrALAZINE (APRESOLINE) 25 MG tablet Take 12.5 mg by mouth 2 (two) times daily.    . isosorbide mononitrate (IMDUR) 30 MG 24 hr tablet TAKE 1 TABLET (30 MG TOTAL) BY MOUTH DAILY. 90 tablet 3  . mexiletine (MEXITIL) 150 MG capsule Take 2 capsules (300 mg total) by mouth 3 (three) times daily. 180 capsule 11  . mirtazapine (REMERON) 15 MG tablet Take 1 tablet (15 mg total) by mouth at bedtime. 30 tablet 0  . Multiple Vitamin (MULTIVITAMIN WITH MINERALS) TABS tablet Take 1 tablet by mouth daily.     . Omega-3 Fatty Acids (FISH OIL) 1000 MG CAPS Take 2,000 mg by mouth 2 (two) times daily.    . potassium chloride SA (K-DUR,KLOR-CON) 20 MEQ tablet Take 1 tablet  (20 mEq total) by mouth daily. 90 tablet 3  . ranitidine (ZANTAC) 300 MG tablet Take 300 mg by mouth at bedtime.    . traMADol (ULTRAM) 50 MG tablet Take 1 tablet (50 mg total) by mouth every 12 (twelve) hours as needed for moderate pain or severe pain. 20 tablet 0   No current facility-administered medications for this visit.     Allergies:   Novocain [procaine] and Amiodarone   Social History:  The patient  reports that he has never smoked. He has never used smokeless tobacco. He reports that he drank alcohol. He reports that he does not use drugs.   Family History:  The patient's family history includes Alzheimer's disease in his mother and unknown relative; COPD in his daughter and sister; Depression in his sister; Esophageal cancer in his sister; Heart disease in his sister; Hyperlipidemia in his sister; Hypertension in his sister.   ROS:  Please see the history of present illness.   Otherwise, review of systems is positive for hearing loss, visual changes, anxiety, muscle pain, dizziness.   All other systems are reviewed and negative.   PHYSICAL EXAM: VS:  BP (!) 142/76   Pulse 70   Ht 6' (1.829 m)   Wt 186 lb (84.4 kg)   SpO2 99%   BMI 25.23 kg/m  , BMI Body mass index is 25.23 kg/m. GEN: Well nourished, well developed, in no acute distress  HEENT: normal  Neck: no JVD, carotid bruits, or masses Cardiac: RRR; no murmurs, rubs, or gallops,no edema  Respiratory:  clear to auscultation bilaterally, normal work of breathing GI: soft, nontender, nondistended, + BS MS: no deformity or atrophy  Skin: warm and dry, device site well healed Neuro:  Strength and sensation are intact Psych: euthymic mood, full affect  EKG:  EKG is ordered today. Personal review of the ekg ordered shows AV paced  Personal review of the device interrogation today. Results in [place]: 06/14/2017: NT-Pro BNP 23,607 07/11/2017: B Natriuretic Peptide 683.7 07/26/2017: ALT 44; Hemoglobin  11.4; Platelets 307 09/18/2017: TSH 20.79 10/18/2017: Magnesium 2.5 10/22/2017: BUN 26; Creatinine, Ser 1.86; Potassium 4.7; Sodium 138    Lipid Panel     Component Value Date/Time   CHOL 126 07/30/2016 1143   CHOL 93 (L) 06/16/2014 0914   TRIG 154 (H) 07/30/2016 1143   TRIG 124 06/16/2014 0914   HDL 45 07/30/2016 1143   HDL 34 (L) 06/16/2014 0914   CHOLHDL 2.8 07/30/2016 1143   CHOLHDL 2.7 01/19/2015 1005   VLDL 32 (H) 01/19/2015 1005   LDLCALC 50 07/30/2016 1143   LDLCALC 34 06/16/2014 0914  Wt Readings from Last 3 Encounters:  11/04/17 186 lb (84.4 kg)  10/18/17 185 lb (83.9 kg)  09/23/17 180 lb 9.6 oz (81.9 kg)      Other studies Reviewed: Additional studies/ records that were reviewed today include: TTE 05/29/17 Review of the above records today demonstrates:  - Left ventricle: The cavity size was normal. Wall thickness was   increased in a pattern of mild LVH. Systolic function was   severely reduced. The estimated ejection fraction was in the   range of 20% to 25%. There is akinesis of the inferolateral,   inferior, and inferoseptal myocardium. Features are consistent   with a pseudonormal left ventricular filling pattern, with   concomitant abnormal relaxation and increased filling pressure   (grade 2 diastolic dysfunction). - Aortic valve: A pericardial tissue valve bioprosthesis was   present and functioning normally. Peak velocity (S): 226 cm/s.   Mean gradient (S): 10 mm Hg. Valve area (VTI): 1.52 cm^2. Valve   area (Vmax): 1.5 cm^2. Valve area (Vmean): 1.59 cm^2. - Mitral valve: Calcified annulus. Mildly thickened leaflets .   There was mild regurgitation. - Left atrium: The atrium was severely dilated. - Right ventricle: Pacer wire or catheter noted in right ventricle. - Pulmonary arteries: Systolic pressure was mildly increased. PA   peak pressure: 35 mm Hg (S).  RHC/LHC 05/31/17  Prox LAD to Mid LAD lesion is 50% stenosed.  Mid LAD to Dist LAD  lesion is 25% stenosed.  1st Mrg lesion is 50% stenosed.  Prox RCA to Dist RCA lesion is 100% stenosed.  And is large.  Prox Graft to Mid Graft lesion is 30% stenosed.  And is large.  The flow in the graft is reversed.  There is competitive flow.  And is large.  There is competitive flow.  Ost 1st Diag to 1st Diag lesion is 90% stenosed.   Findings:  Ao = 145/67 (99)  RA = 6 RV = 70/9 PA = 69/30 (46) PCW = 26 v = 37 Fick cardiac output/index =4.7/2.3 PVR = 4.3 FA sat = 98% PA sat = 63%, 60%  Assessment:  1. 3v CAD with stable revascularization with patent LIMA-LAD, SVG-diagonal and SVG-RCA 2. 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 Normal cardiac output - no evidence of thyrotoxicosis heart disease or PH   ASSESSMENT AND PLAN:  1.  Chronic combined systolic and diastolic congestive heart failure due to ischemic cardiomyopathy: Status post Medtronic CRT-D implanted 04/22/2015.  Currently on optimal therapy including hydralazine, imdur, Coreg, and mexiletine for his ventricular tachycardia.  No signs of volume overload.  No changes.    2. Paroxysmal atrial fibrillation: On Eliquis.  Off amiodarone.  Remains in sinus rhythm.  This patients CHA2DS2-VASc Score and unadjusted Ischemic Stroke Rate (% per year) is equal to 7.2 % stroke rate/year from a score of 5  Above score calculated as 1 point each if present [CHF, HTN, DM, Vascular=MI/PAD/Aortic Plaque, Age if 65-74, or Male] Above score calculated as 2 points each if present [Age > 75, or Stroke/TIA/TE]  3.  Ventricular tachycardia: On device interrogation.  He had a shock for VT on 10/18/2017.  He is currently on maximal doses of mexiletine and is not tolerated amiodarone due to side effects.  I discussed with him further options including ablation.  I feel that ablation is likely the best option to treat his ventricular tachycardia.  He would like to think  about this and Carollynn Pennywell call us back with an answer.  He Khristy Kalan discuss it with his family.  No driving per [facility] law  4.  Coronary artery disease status post CABG: No current chest pain.  Continue with current management.  Current medicines are reviewed at length with the patient today.   The patient does not have concerns regarding his medicines.  The following changes were made today: None  Labs/ tests ordered today include:  No orders of the defined types were placed in this encounter.   Disposition:   FU with Posey Petrik 3 months  Signed, all of Roselynne Lortz Meredith Leeds, MD  11/04/2017 3:34 PM     [facility] [address] [place] [address] [phone] (office) [phone] (fax)

## 2017-11-04 NOTE — Patient Instructions (Addendum)
Medication Instructions:  Your physician recommends that you continue on your current medications as directed. Please refer to the Current Medication list given to you today.  * If you need a refill on your cardiac medications before your next appointment, please call your pharmacy.   Labwork: None ordered  Testing/Procedures: None ordered  Follow-Up: Your physician recommends that you schedule a follow-up appointment in: 3 months with Dr. Curt Bears.  *Please note that any paperwork needing to be filled out by the provider will need to be addressed at the front desk prior to seeing the provider. Please note that any FMLA, disability or other documents regarding health condition is subject to a $25.00 charge that must be received prior to completion of paperwork in the form of a money order or check.  Thank you for choosing [facility]!!   Trinidad Curet, RN [phone]  Any Other Special Instructions Will Be Listed Below (If Applicable). The nurse will contact you in several weeks to follow up and see if you would like to proceed with ablation or wait.   Cardiac Ablation Cardiac ablation is a procedure to disable (ablate) a small amount of heart tissue in very specific places. The heart has many electrical connections. Sometimes these connections are abnormal and can cause the heart to beat very fast or irregularly. Ablating some of the problem areas can improve the heart rhythm or return it to normal. Ablation may be done for people who:  Have Wolff-Parkinson-White syndrome.  Have fast heart rhythms (tachycardia).  Have taken medicines for an abnormal heart rhythm (arrhythmia) that were not effective or caused side effects.  Have a high-risk heartbeat that may be life-threatening.  During the procedure, a small incision is made in the neck or the groin, and a long, thin, flexible tube (catheter) is inserted into the incision and moved to the heart. Small devices (electrodes)  on the tip of the catheter will send out electrical currents. A type of X-ray (fluoroscopy) will be used to help guide the catheter and to provide images of the heart. Tell a health care provider about:  Any allergies you have.  All medicines you are taking, including vitamins, herbs, eye drops, creams, and over-the-counter medicines.  Any problems you or family members have had with anesthetic medicines.  Any blood disorders you have.  Any surgeries you have had.  Any medical conditions you have, such as kidney failure.  Whether you are pregnant or may be pregnant. What are the risks? Generally, this is a safe procedure. However, problems may occur, including:  Infection.  Bruising and bleeding at the catheter insertion site.  Bleeding into the chest, especially into the sac that surrounds the heart. This is a serious complication.  Stroke or blood clots.  Damage to other structures or organs.  Allergic reaction to medicines or dyes.  Need for a permanent pacemaker if the normal electrical system is damaged. A pacemaker is a small computer that sends electrical signals to the heart and helps your heart beat normally.  The procedure not being fully effective. This may not be recognized until months later. Repeat ablation procedures are sometimes required.  What happens before the procedure?  Follow instructions from your health care provider about eating or drinking restrictions.  Ask your health care provider about: ? Changing or stopping your regular medicines. This is especially important if you are taking diabetes medicines or blood thinners. ? Taking medicines such as aspirin and ibuprofen. These medicines can thin your blood. Do  not take these medicines before your procedure if your health care provider instructs you not to.  Plan to have someone take you home from the hospital or clinic.  If you will be going home right after the procedure, plan to have someone  with you for 24 hours. What happens during the procedure?  To lower your risk of infection: ? Your health care team will wash or sanitize their hands. ? Your skin will be washed with soap. ? Hair may be removed from the incision area.  An IV tube will be inserted into one of your veins.  You will be given a medicine to help you relax (sedative).  The skin on your neck or groin will be numbed.  An incision will be made in your neck or your groin.  A needle will be inserted through the incision and into a large vein in your neck or groin.  A catheter will be inserted into the needle and moved to your heart.  Dye may be injected through the catheter to help your surgeon see the area of the heart that needs treatment.  Electrical currents will be sent from the catheter to ablate heart tissue in desired areas. There are three types of energy that may be used to ablate heart tissue: ? Heat (radiofrequency energy). ? Laser energy. ? Extreme cold (cryoablation).  When the necessary tissue has been ablated, the catheter will be removed.  Pressure will be held on the catheter insertion area to prevent excessive bleeding.  A bandage (dressing) will be placed over the catheter insertion area. The procedure may vary among health care providers and hospitals. What happens after the procedure?  Your blood pressure, heart rate, breathing rate, and blood oxygen level will be monitored until the medicines you were given have worn off.  Your catheter insertion area will be monitored for bleeding. You will need to lie still for a few hours to ensure that you do not bleed from the catheter insertion area.  Do not drive for 24 hours or as long as directed by your health care provider. Summary  Cardiac ablation is a procedure to disable (ablate) a small amount of heart tissue in very specific places. Ablating some of the problem areas can improve the heart rhythm or return it to normal.  During  the procedure, electrical currents will be sent from the catheter to ablate heart tissue in desired areas. This information is not intended to replace advice given to you by your health care provider. Make sure you discuss any questions you have with your health care provider. Document Released: 08/12/2008 Document Revised: 02/13/2016 Document Reviewed: 02/13/2016 Elsevier Interactive Patient Education  [name].

## 2017-11-07 ENCOUNTER — Telehealth: Payer: Self-pay | Admitting: *Deleted

## 2017-11-07 ENCOUNTER — Encounter: Payer: Medicare HMO | Admitting: Cardiology

## 2017-11-07 NOTE — Telephone Encounter (Signed)
-----  Message from [facility], [place] sent at 10/22/2017  4:59 PM EDT ----- K+ is better, continue to take the potassium every other day for now.  Keep follow up with Dr. Curt Bears as scheduled.  Thanks renee

## 2017-11-07 NOTE — Telephone Encounter (Signed)
SPOKE WITH  PT AND AWARE OF RESULTS

## 2017-11-11 ENCOUNTER — Ambulatory Visit (INDEPENDENT_AMBULATORY_CARE_PROVIDER_SITE_OTHER): Payer: Medicare HMO | Admitting: *Deleted

## 2017-11-11 DIAGNOSIS — I472 Ventricular tachycardia, unspecified: Secondary | ICD-10-CM

## 2017-11-11 DIAGNOSIS — I5022 Chronic systolic (congestive) heart failure: Secondary | ICD-10-CM

## 2017-11-12 NOTE — Progress Notes (Signed)
Remote ICD transmission.

## 2017-11-13 ENCOUNTER — Telehealth: Payer: Self-pay | Admitting: Cardiology

## 2017-11-13 NOTE — Telephone Encounter (Signed)
New message   Patient's daughter [name] calling with questions regarding ablation. Please call at [phone] ext [phone]

## 2017-11-14 NOTE — Telephone Encounter (Signed)
Confirmed ok w/ patient, yesterday, it was ok to speak w/ dtr [name]. General questions on ablation were answered. dtr appreciative for speaking with her

## 2017-11-15 LAB — CUP PACEART INCLINIC DEVICE CHECK
Brady Statistic AP VP Percent: 64.21 %
Brady Statistic AP VS Percent: 0.87 %
Brady Statistic AS VP Percent: 34.36 %
Brady Statistic RA Percent Paced: 64.81 %
Brady Statistic RV Percent Paced: 97.13 %
Date Time Interrogation Session: 20190729184451
HighPow Impedance: 67 Ohm
Implantable Lead Implant Date: 20170113
Implantable Lead Implant Date: 20170113
Implantable Lead Location: 753858
Implantable Lead Model: 4298
Implantable Pulse Generator Implant Date: 20170113
Lead Channel Impedance Value: 323 Ohm
Lead Channel Impedance Value: 323 Ohm
Lead Channel Impedance Value: 342 Ohm
Lead Channel Impedance Value: 399 Ohm
Lead Channel Impedance Value: 437 Ohm
Lead Channel Impedance Value: 589 Ohm
Lead Channel Pacing Threshold Amplitude: 0.5 V
Lead Channel Pacing Threshold Pulse Width: 0.4 ms
Lead Channel Sensing Intrinsic Amplitude: 2.875 mV
Lead Channel Sensing Intrinsic Amplitude: 7.875 mV
Lead Channel Setting Pacing Amplitude: 1.75 V
Lead Channel Setting Pacing Pulse Width: 0.4 ms
Lead Channel Setting Sensing Sensitivity: 0.3 mV
MDC IDC LEAD IMPLANT DT: 20170113
MDC IDC LEAD LOCATION: 753859
MDC IDC LEAD LOCATION: 753860
MDC IDC MSMT BATTERY REMAINING LONGEVITY: 58 mo
MDC IDC MSMT BATTERY VOLTAGE: 2.98 V
MDC IDC MSMT LEADCHNL LV IMPEDANCE VALUE: 380 Ohm
MDC IDC MSMT LEADCHNL LV IMPEDANCE VALUE: 570 Ohm
MDC IDC MSMT LEADCHNL LV IMPEDANCE VALUE: 627 Ohm
MDC IDC MSMT LEADCHNL LV IMPEDANCE VALUE: 646 Ohm
MDC IDC MSMT LEADCHNL LV IMPEDANCE VALUE: 665 Ohm
MDC IDC MSMT LEADCHNL LV PACING THRESHOLD AMPLITUDE: 0.375 V
MDC IDC MSMT LEADCHNL LV PACING THRESHOLD PULSEWIDTH: 0.4 ms
MDC IDC MSMT LEADCHNL RA PACING THRESHOLD AMPLITUDE: 0.75 V
MDC IDC MSMT LEADCHNL RA PACING THRESHOLD PULSEWIDTH: 0.4 ms
MDC IDC MSMT LEADCHNL RA SENSING INTR AMPL: 2 mV
MDC IDC MSMT LEADCHNL RV IMPEDANCE VALUE: 380 Ohm
MDC IDC MSMT LEADCHNL RV IMPEDANCE VALUE: 456 Ohm
MDC IDC MSMT LEADCHNL RV SENSING INTR AMPL: 6.75 mV
MDC IDC SET LEADCHNL LV PACING AMPLITUDE: 1.5 V
MDC IDC SET LEADCHNL RV PACING AMPLITUDE: 2 V
MDC IDC SET LEADCHNL RV PACING PULSEWIDTH: 0.4 ms
MDC IDC STAT BRADY AS VS PERCENT: 0.56 %

## 2017-11-20 LAB — CUP PACEART REMOTE DEVICE CHECK
Battery Remaining Longevity: 58 mo
Battery Voltage: 2.97 V
Brady Statistic AP VS Percent: 0.9 %
Brady Statistic AS VS Percent: 0.71 %
Brady Statistic RV Percent Paced: 94.48 %
HighPow Impedance: 70 Ohm
Implantable Lead Implant Date: 20170113
Implantable Lead Implant Date: 20170113
Implantable Lead Location: 753859
Implantable Lead Location: 753860
Implantable Lead Model: 4298
Implantable Lead Model: 5076
Implantable Pulse Generator Implant Date: 20170113
Lead Channel Impedance Value: 323 Ohm
Lead Channel Impedance Value: 342 Ohm
Lead Channel Impedance Value: 380 Ohm
Lead Channel Impedance Value: 399 Ohm
Lead Channel Impedance Value: 437 Ohm
Lead Channel Impedance Value: 570 Ohm
Lead Channel Impedance Value: 627 Ohm
Lead Channel Impedance Value: 627 Ohm
Lead Channel Impedance Value: 627 Ohm
Lead Channel Pacing Threshold Amplitude: 0.375 V
Lead Channel Pacing Threshold Pulse Width: 0.4 ms
Lead Channel Pacing Threshold Pulse Width: 0.4 ms
Lead Channel Sensing Intrinsic Amplitude: 1.875 mV
Lead Channel Sensing Intrinsic Amplitude: 1.875 mV
Lead Channel Sensing Intrinsic Amplitude: 6.25 mV
Lead Channel Setting Pacing Amplitude: 1.5 V
Lead Channel Setting Pacing Amplitude: 1.5 V
Lead Channel Setting Pacing Amplitude: 2 V
Lead Channel Setting Pacing Pulse Width: 0.4 ms
MDC IDC LEAD IMPLANT DT: 20170113
MDC IDC LEAD LOCATION: 753858
MDC IDC MSMT LEADCHNL LV IMPEDANCE VALUE: 323 Ohm
MDC IDC MSMT LEADCHNL LV IMPEDANCE VALUE: 342 Ohm
MDC IDC MSMT LEADCHNL LV IMPEDANCE VALUE: 570 Ohm
MDC IDC MSMT LEADCHNL RA IMPEDANCE VALUE: 399 Ohm
MDC IDC MSMT LEADCHNL RA PACING THRESHOLD AMPLITUDE: 0.75 V
MDC IDC MSMT LEADCHNL RV PACING THRESHOLD AMPLITUDE: 0.5 V
MDC IDC MSMT LEADCHNL RV PACING THRESHOLD PULSEWIDTH: 0.4 ms
MDC IDC MSMT LEADCHNL RV SENSING INTR AMPL: 6.25 mV
MDC IDC SESS DTM: 20190805173725
MDC IDC SET LEADCHNL LV PACING PULSEWIDTH: 0.4 ms
MDC IDC SET LEADCHNL RV SENSING SENSITIVITY: 0.3 mV
MDC IDC STAT BRADY AP VP PERCENT: 55.49 %
MDC IDC STAT BRADY AS VP PERCENT: 42.91 %
MDC IDC STAT BRADY RA PERCENT PACED: 56 %

## 2017-11-26 ENCOUNTER — Telehealth: Payer: Self-pay

## 2017-11-26 NOTE — Telephone Encounter (Signed)
-----  Message from [name], MD sent at 11/26/2017 10:14 AM EDT ----- Abnormal device interrogation reviewed.  Lead parameters and battery status stable.  Optivol elevated. Lasix 40 mg BID x3 days.

## 2017-11-26 NOTE — Telephone Encounter (Signed)
Spoke with pt asked that he send in another transmission to make sure that fluid levels are still elevated before increasing Lasix that was recommended from remote transmission received on 11/12/17.

## 2017-11-26 NOTE — Telephone Encounter (Signed)
Repeat transmission received fluid levels back to baseline WC made aware

## 2017-12-08 ENCOUNTER — Other Ambulatory Visit: Payer: Self-pay | Admitting: Internal Medicine

## 2017-12-11 ENCOUNTER — Other Ambulatory Visit: Payer: Self-pay | Admitting: Cardiology

## 2017-12-11 NOTE — Telephone Encounter (Signed)
Eliquis 2.56m refill request received; pt is 84yrs old, wt-84.4kg, Crea-1.86 on 10/23/47, last seen by Dr. CCurt Bearson 11/04/17; will send in refill to requested pharmacy.

## 2017-12-17 ENCOUNTER — Encounter: Payer: Self-pay | Admitting: Internal Medicine

## 2017-12-17 ENCOUNTER — Ambulatory Visit (INDEPENDENT_AMBULATORY_CARE_PROVIDER_SITE_OTHER): Payer: Medicare HMO | Admitting: Internal Medicine

## 2017-12-17 VITALS — BP 122/80 | HR 76 | Ht 72.0 in | Wt 185.4 lb

## 2017-12-17 DIAGNOSIS — E0590 Thyrotoxicosis, unspecified without thyrotoxic crisis or storm: Secondary | ICD-10-CM | POA: Diagnosis not present

## 2017-12-17 DIAGNOSIS — E042 Nontoxic multinodular goiter: Secondary | ICD-10-CM

## 2017-12-17 LAB — T4, FREE: FREE T4: 0.75 ng/dL (ref 0.60–1.60)

## 2017-12-17 LAB — T3, FREE: T3, Free: 2.9 pg/mL (ref 2.3–4.2)

## 2017-12-17 LAB — TSH: TSH: 3.71 u[IU]/mL (ref 0.35–4.50)

## 2017-12-17 NOTE — Patient Instructions (Signed)
Please stop at the lab.  If we need to start Levothyroxine, Take the thyroid hormone every day, with water, at least 30 minutes before breakfast, separated by at least 4 hours from: - acid reflux medications - calcium - iron - multivitamins  If we start levothyroxine, move multivitamins at night.  Please come back for a follow-up appointment in 4 months.

## 2017-12-17 NOTE — Progress Notes (Signed)
Patient ID: [name], male   DOB: June 28, 1932, 84 y.o.   MRN: [id number]    HPI  KEYVIN [name] is a 84 y.o.-year-old male, initially referred by Dr. Tana Coast, returning for follow-up for amiodarone-induced thyrotoxicosis and thyroid nodules.   Last visit 4 months ago.  Reviewed and addended history: Patient has a history of CHF secondary to ischemic cardiomyopathy and atrial fibrillation. He was started on Amiodarone 08/2014 >> he was taken off 04/2017 2/2 suspected amiodarone-induced lung ds.  He was started on Mexitil instead.  On 05/08/2017, he saw his cardiologist and complained of weakness, shortness of breath, weight loss was directed to the emergency department.  TFTs were checked and they were thyrotoxic.  I was contacted over the phone by Dr. Tana Coast adjusted to start methimazole 5 mg twice a day.  He was discharged on 05/10/2017.    We investigated his thyrotoxicosis with the following results: -TSI antibody slightly elevated -Thyroglobulin clearly elevated -Thyroid ultrasound (05/22/2017): No increased blood flow, spongiform small nodules +1.4 cm cyst  Due to the possibility of a combination of overproduction and inflammatory amiodarone induced thyrotoxicosis (AIT), I suggested:  - to increase the methimazole dose to 10 mg twice a day, however, afterwards, we were able to taper the methimazole to off. - to start prednisone 20 mg daily and we were able stop MMI and Prednisone in 09/2017 (he misunderstood instructions to decrease the dose of prednisone to 2.5 mg daily and he actually stopped it completely then).  On this regimen, he started to feel much better, without shortness of breath, palpitations, chest pain, with improved tremors. He previously had weight loss (20 pounds in 2 weeks), which resolved after starting methimazole. He also had fatigue, which has improved significantly, and heat intolerance/sweating which resolved completely.  However, his TSH increased afterwards (see  below).  Reviewed patient's TFTs: Lab Results  Component Value Date   TSH 20.79 (H) 09/18/2017   TSH 16.53 (H) 08/13/2017   TSH <0.010 (L) 05/26/2017   TSH <0.01 (L) 05/14/2017   TSH <0.010 (L) 05/10/2017   TSH 1.630 01/28/2017   TSH 1.920 07/30/2016   TSH 1.27 07/28/2015   TSH 1.654 01/19/2015   TSH 1.85 10/14/2014   Lab Results  Component Value Date   FREET4 0.50 (L) 09/18/2017   FREET4 0.56 (L) 08/13/2017   FREET4 4.01 (H) 05/26/2017   FREET4 4.59 (H) 05/14/2017   FREET4 4.44 (H) 05/10/2017   Lab Results  Component Value Date   T3FREE 2.4 09/18/2017   T3FREE 2.2 (L) 08/13/2017   T3FREE 4.2 05/26/2017   T3FREE 5.8 (H) 05/14/2017   05/10/2017: total T3:  134 (71-180)  Thyroglobulin level was elevated: Lab Results  Component Value Date   THYROGLB 289.5 (H) 05/14/2017    Graves' antibodies were only mildly elevated: Lab Results  Component Value Date   TSI 172 (H) 05/14/2017   Pt denies: - feeling nodules in neck - dysphagia - choking - SOB with lying down + hoarseness  Pt does not have a FH of thyroid ds. No FH of thyroid cancer. No h/o radiation tx to head or neck.  No seaweed or kelp. No recent contrast studies. No herbal supplements. No Biotin use. No recent steroids use.   Pt. also has a history of A fib for >10 years, h/o Ao valve replacement, HL, GERD.  He continues to have recurrent pleural effusions -drained several times in the past.  ROS: Constitutional: no weight gain/no weight loss, + fatigue, no  subjective hyperthermia, no subjective hypothermia, + decreased appetite Eyes: + blurry vision, no xerophthalmia ENT: no sore throat, + see HPI Cardiovascular: no CP/no SOB/no palpitations/no leg swelling Respiratory: no cough/no SOB/no wheezing Gastrointestinal: no N/no V/no D/no C/no acid reflux Musculoskeletal: no muscle aches/no joint aches Skin: no rashes, no hair loss Neurological: no tremors/no numbness/no tingling/no dizziness  I reviewed  pt's medications, allergies, PMH, social hx, family hx, and changes were documented in the history of present illness. Otherwise, unchanged from my initial visit note.  Past Medical History:  Diagnosis Date  . Abnormal nuclear stress test: HIGH RISK 11/04/2014  . AICD (automatic cardioverter/defibrillator) present   . Arthritis    "minor everywhere" (04/22/2015)  . Asthma    "a touch"  . Barrett esophagus   . CAD (coronary artery disease)    a. s/p 2 vessel CABG with LIMA to LAD, SVG to diagonal 1, SVG to RCA 12/09.  . Cardiac resynchronization therapy defibrillator (CRT-D) in place   . Chronic anticoagulation 01/23/2013  . Chronic combined systolic and diastolic CHF (congestive heart failure) (HCC)    dry weight 197-200lbs.  . Chronic combined systolic and diastolic heart failure ([place])   . CKD (chronic kidney disease) stage 3, GFR 30-59 ml/min (HCC) 08/23/2014  . CKD (chronic kidney disease), stage III ([place])   . Coronary artery disease 01/23/2013  . Diastolic dysfunction 22/48/2500  . Dyslipidemia 01/23/2013  . Dyspnea   . Essential hypertension 01/23/2013  . GERD (gastroesophageal reflux disease)   . GI bleed 05/08/2017  . H/O: rheumatic fever   . Hepatitis 1957   "don't know what kind:  . History of aortic valve replacement with bioprosthetic valve   . History of hiatal hernia   . History of PFTs    a. Amiodarone started 5/16 >> PFTs w/ DLCO 6/16:  FEV1 72% predicted, FEV1/FVC 66%, DLCO 66% >> minimal reversible obstructive airways disease with mild diffusion defect (suggestive of emphysema but absence of hyperinflation inconsistent with dx)  . HTN (hypertension)   . Hypercholesteremia   . Hyperkalemia 08/23/2014  . Hyperthyroidism 05/26/2017  . Leg weakness 01/28/2017  . Multiple thyroid nodules 08/13/2017  . NICM (nonischemic cardiomyopathy) ([employer])    EF 15-20% by echo 2017  . Orthostatic hypotension   . PAF (paroxysmal atrial fibrillation) ([place])   . Persistent atrial  fibrillation ([place]) 01/23/2013  . Pleural effusion on right 05/26/2017  . Pneumonia 08/2014   "dr thought I may have had a touch"  . Pre-diabetes   . Protein-calorie malnutrition, severe 05/27/2017  . S/P AVR (aortic valve replacement) 2009   a. severe AS s/p AVR with pericardial tissue valve 2009.  Marland Kitchen Shingles 08/23/2014  . Ventricular tachycardia ([place])   . VT (ventricular tachycardia) ([place]) 05/06/2017   Past Surgical History:  Procedure Laterality Date  . AORTIC VALVE REPLACEMENT  with 23-mm Magna Ease pericardial valve, model number   with 23-mm Magna Ease pericardial valve, model [id number], serial number [id number]   . APPENDECTOMY  1940s  . BI-VENTRICULAR IMPLANTABLE CARDIOVERTER DEFIBRILLATOR  (CRT-D)  04/22/2015  . CARDIAC CATHETERIZATION N/A 11/04/2014   Procedure: Left Heart Cath and Cors/Grafts Angiography;  Surgeon: Leonie Man, MD;  Location: [facility];  Service: Cardiovascular;  Laterality: N/A;  . CARDIAC VALVE REPLACEMENT    . CARDIOVERSION N/A 01/27/2013   Procedure: CARDIOVERSION;  Surgeon: Sueanne Margarita, MD;  Location: [facility];  Service: Cardiovascular;  Laterality: N/A;  . CARDIOVERSION N/A 08/25/2014   Procedure: CARDIOVERSION;  Surgeon:  Sanda Klein, MD;  Location: [facility];  Service: Cardiovascular;  Laterality: N/A;  . CATARACT EXTRACTION W/ INTRAOCULAR LENS  IMPLANT, BILATERAL Bilateral   . CHEST TUBE INSERTION Right 07/26/2017   Procedure: INSERTION PLEURAL DRAINAGE CATHETER - RIGHT;  Surgeon: Ivin Poot, MD;  Location: [facility];  Service: Thoracic;  Laterality: Right;  . CORONARY ARTERY BYPASS GRAFT  03/10/2008   x 3 Dr. Roxan Hockey  . EP IMPLANTABLE DEVICE N/A 04/22/2015   Procedure: BiV ICD Insertion CRT-D;  Surgeon: [name], MD;  Location: [facility];  Service: Cardiovascular;  Laterality: N/A;  . ESOPHAGOGASTRODUODENOSCOPY (EGD) WITH ESOPHAGEAL DILATION  X1  . ESOPHAGOGASTRODUODENOSCOPY (EGD) WITH PROPOFOL N/A  05/09/2017   Procedure: ESOPHAGOGASTRODUODENOSCOPY (EGD) WITH PROPOFOL;  Surgeon: Carol Ada, MD;  Location: [facility];  Service: Endoscopy;  Laterality: N/A;  . IR THORACENTESIS ASP PLEURAL SPACE W/IMG GUIDE  05/27/2017  . IR THORACENTESIS ASP PLEURAL SPACE W/IMG GUIDE  06/17/2017  . IR THORACENTESIS ASP PLEURAL SPACE W/IMG GUIDE  06/18/2017  . IR THORACENTESIS ASP PLEURAL SPACE W/IMG GUIDE  07/11/2017  . REMOVAL OF PLEURAL DRAINAGE CATHETER Right 09/13/2017   Procedure: REMOVAL OF PLEURAL DRAINAGE CATHETER;  Surgeon: Ivin Poot, MD;  Location: [facility];  Service: Thoracic;  Laterality: Right;  . RIGHT HEART CATH AND CORONARY/GRAFT ANGIOGRAPHY N/A 05/31/2017   Procedure: RIGHT HEART CATH AND CORONARY/GRAFT ANGIOGRAPHY;  Surgeon: Jolaine Artist, MD;  Location: [facility];  Service: Cardiovascular;  Laterality: N/A;  . TONSILLECTOMY     Social History   Tobacco Use  . Smoking status: Never Smoker  . Smokeless tobacco: Never Used  Substance and Sexual Activity  . Alcohol use: Yes    Comment: 04/22/2015 "nothing since the 1980s; never had a problem w/it"  . Drug use: No  . Sexual activity: No  Other Topics Concern  . Not on file  Social History Narrative   Married, lives with spouse [name]   4 children, 2 boys and 2 girls > one daughter passed   OCCUPATION: retired, Pension scheme manager for [employer]   Current Outpatient Medications on File Prior to Visit  Medication Sig Dispense Refill  . albuterol (PROVENTIL HFA;VENTOLIN HFA) 108 (90 Base) MCG/ACT inhaler Inhale 1-2 puffs into the lungs every 6 (six) hours as needed for wheezing.     . Amino Acids-Protein Hydrolys (FEEDING SUPPLEMENT, PRO-STAT SUGAR FREE 64,) LIQD Take 30 mLs by mouth daily. 900 mL 0  . atorvastatin (LIPITOR) 20 MG tablet TAKE 1 TABLET (20 MG TOTAL) DAILY AT 6 PM. 90 tablet 3  . carvedilol (COREG) 12.5 MG tablet Take 1 tablet (12.5 mg total) by mouth 2 (two) times daily. 60 tablet 5  . ELIQUIS 2.5  MG TABS tablet TAKE 1 TABLET TWICE DAILY 180 tablet 2  . feeding supplement, ENSURE ENLIVE, (ENSURE ENLIVE) LIQD Take 237 mLs by mouth 2 (two) times daily between meals. 60 Bottle 0  . furosemide (LASIX) 40 MG tablet Take 1 tablet (40 mg total) by mouth daily. 90 tablet 3  . hydrALAZINE (APRESOLINE) 25 MG tablet Take 12.5 mg by mouth 2 (two) times daily.    . isosorbide mononitrate (IMDUR) 30 MG 24 hr tablet TAKE 1 TABLET (30 MG TOTAL) BY MOUTH DAILY. 90 tablet 3  . mexiletine (MEXITIL) 150 MG capsule Take 2 capsules (300 mg total) by mouth 3 (three) times daily. 180 capsule 11  . mirtazapine (REMERON) 15 MG tablet Take 1 tablet (15 mg total) by mouth at  bedtime. 30 tablet 0  . Multiple Vitamin (MULTIVITAMIN WITH MINERALS) TABS tablet Take 1 tablet by mouth daily.     . Omega-3 Fatty Acids (FISH OIL) 1000 MG CAPS Take 2,000 mg by mouth 2 (two) times daily.    . potassium chloride SA (K-DUR,KLOR-CON) 20 MEQ tablet Take 1 tablet (20 mEq total) by mouth daily. 90 tablet 3  . ranitidine (ZANTAC) 300 MG tablet Take 300 mg by mouth at bedtime.    . traMADol (ULTRAM) 50 MG tablet Take 1 tablet (50 mg total) by mouth every 12 (twelve) hours as needed for moderate pain or severe pain. 20 tablet 0   No current facility-administered medications on file prior to visit.    Allergies  Allergen Reactions  . Novocain [Procaine] Swelling    SWELLING REACTION UNSPECIFIED   . Amiodarone Nausea And Vomiting   Family History  Problem Relation Age of Onset  . Alzheimer's disease Mother   . COPD Daughter   . Alzheimer's disease Unknown   . COPD Sister   . Depression Sister   . Heart disease Sister   . Hyperlipidemia Sister   . Hypertension Sister   . Esophageal cancer Sister        + smoker    PE: BP 122/80   Pulse 76   Ht 6' (1.829 m)   Wt 185 lb 6.4 oz (84.1 kg)   SpO2 98%   BMI 25.14 kg/m  Wt Readings from Last 3 Encounters:  12/17/17 185 lb 6.4 oz (84.1 kg)  11/04/17 186 lb (84.4 kg)   10/18/17 185 lb (83.9 kg)   Constitutional: Normal weight, in NAD Eyes: PERRLA, EOMI, no exophthalmos ENT: moist mucous membranes, no thyromegaly, no cervical lymphadenopathy Cardiovascular: RRR, No MRG Respiratory: CTA B Gastrointestinal: abdomen soft, NT, ND, BS+ Musculoskeletal: no deformities, strength intact in all 4 Skin: moist, warm, no rashes, + many ecchymosis Neurological: + Mild tremor with outstretched hands, DTR normal in all 4  ASSESSMENT: 1.  Amiodarone-induced thyrotoxicosis  2. Thyroid nodules Thyroid ultrasound (05/22/2017       FINDINGS: Parenchymal Echotexture: Moderately heterogenous Isthmus: 0.6 cm Right lobe: 3.8 cm x 1.9 cm x 1.7 cm Left lobe: 4.4 cm x 2.0 cm x 1.9 cm ______________________________________________  Nodule # 1: Location: Right; Inferior Maximum size: 0.8 cm; Other 2 dimensions: 0.5 cm x 0.8 cm Composition: spongiform (0) ACR TI-RADS recommendations: Spongiform nodule does not meet criteria for surveillance or biopsy _______________________________________________________  Nodule # 2: Location: Left; Superior Maximum size: 0.9 cm; Other 2 dimensions: 0.8 cm x 0.8 cm Composition: spongiform (0) ACR TI-RADS recommendations: Spongiform nodule does not meet criteria for surveillance or biopsy _________________________________________________  Nodule # 3: Location: Left; Mid Maximum size: 0.9 cm; Other 2 dimensions: 0.7 cm x 0.8 cm Composition: spongiform (0) ACR TI-RADS recommendations: Spongiform nodule does not meet criteria for surveillance or biopsy _________________________________________________________  Nodule # 4: Location: Left; Inferior Maximum size: 1.4 cm; Other 2 dimensions: 41.1 cm x 1.1 cm Composition: cystic/almost completely cystic (0) Echogenicity: anechoic (0) Cystic nodule does not meet criteria for surveillance or biopsy  _________________________________________________________  Color flow  demonstrates patchy regions of internal flow, not significantly increased within the left or right thyroid.  No adenopathy.  IMPRESSION: No thyroid nodule meets criteria for biopsy or surveillance, as designated by the newly established ACR TI-RADS criteria. Patchy regions of internal color flow of the thyroid parenchyma, not significantly increased.    3. Elevated HbA1c  PLAN:   1.  Patient  with history of thyrotoxicosis, initially with thyrotoxic symptoms: Dramatic weight loss, heat intolerance, tremors, but without palpitations, anxiety, hyper defecation.  The thyrotoxicosis was most likely related to his amiodarone treatment.  However, the amiodarone was stopped in 04/2017. He had a mixed AIT type I + AIT type II picture (mildly elevated TSI, but also elevated thyroglobulin and no increased blood flow on ultrasound) >> We initially started him on methimazole 5 mg twice a day, which was subsequently increased to 10 mg twice a day, and also started prednisone 20 mg daily.  He started to feel much better after starting the above regimen.  His TSH started to increase and we ended up decreasing the dose and then stopping methimazole completely.  We decreased his prednisone dose to 5 mg daily in 08/2017 and stopped in 09/2017. -Reviewed together his latest TSH >> elevated in 09/2017 -We will recheck his TFTs today  -We discussed that we will probably need to start him on levothyroxine depending on the results of his TFTs.  I advised him that if he needs to start, he needs to take the thyroid hormone every day, with water, at least 30 minutes before breakfast, separated by at least 4 hours from: - acid reflux medications - calcium - iron - multivitamins -He continues on a beta-blocker per cardiology -I will see him back in 4 months.  2. Thyroid nodules -No neck compression symptoms -I reviewed the results of his most recent thyroid ultrasound: He has several small thyroid nodules, all  spongiform (an indication for benignity), except for one cystic nodule of 1.4 cm.  None of these are worrisome.  No intervention is needed for now. -We will continue to monitor him clinically.  3 Elevated HbA1c  - An HbA1c of 10 by mistake at last visit was in the diabetic range: 6.9% - At that time, we discussed about changing his diet - We will recheck his HbA1c at next visit.  Office Visit on 12/17/2017  Component Date Value Ref Range Status  . TSH 12/17/2017 3.71  0.35 - 4.50 uIU/mL Final  . Free T4 12/17/2017 0.75  0.60 - 1.60 ng/dL Final   Comment: Specimens from patients who are undergoing biotin therapy and /or ingesting biotin supplements may contain high levels of biotin.  The higher biotin concentration in these specimens interferes with this Free T4 assay.  Specimens that contain high levels  of biotin may cause false high results for this Free T4 assay.  Please interpret results in light of the total clinical presentation of the patient.    . T3, Free 12/17/2017 2.9  2.3 - 4.2 pg/mL Final   Thyroid tests are all normal.  I would like to recheck them when he comes back in 4 months.  No intervention needed for now.  Philemon Kingdom, MD PhD [facility]

## 2017-12-27 ENCOUNTER — Other Ambulatory Visit: Payer: Self-pay | Admitting: Cardiology

## 2017-12-27 ENCOUNTER — Encounter: Payer: Medicare HMO | Admitting: Cardiology

## 2018-01-07 ENCOUNTER — Ambulatory Visit: Payer: Medicare HMO | Admitting: Cardiology

## 2018-01-07 ENCOUNTER — Encounter: Payer: Self-pay | Admitting: Cardiology

## 2018-01-07 VITALS — BP 124/60 | HR 65 | Ht 72.0 in | Wt 185.2 lb

## 2018-01-07 DIAGNOSIS — I5042 Chronic combined systolic (congestive) and diastolic (congestive) heart failure: Secondary | ICD-10-CM

## 2018-01-07 DIAGNOSIS — I4819 Other persistent atrial fibrillation: Secondary | ICD-10-CM

## 2018-01-07 DIAGNOSIS — I420 Dilated cardiomyopathy: Secondary | ICD-10-CM | POA: Diagnosis not present

## 2018-01-07 DIAGNOSIS — E785 Hyperlipidemia, unspecified: Secondary | ICD-10-CM | POA: Diagnosis not present

## 2018-01-07 DIAGNOSIS — I472 Ventricular tachycardia, unspecified: Secondary | ICD-10-CM

## 2018-01-07 DIAGNOSIS — I10 Essential (primary) hypertension: Secondary | ICD-10-CM | POA: Diagnosis not present

## 2018-01-07 DIAGNOSIS — I2510 Atherosclerotic heart disease of native coronary artery without angina pectoris: Secondary | ICD-10-CM

## 2018-01-07 LAB — BASIC METABOLIC PANEL
BUN / CREAT RATIO: 16 (ref 10–24)
BUN: 31 mg/dL — AB (ref 8–27)
CHLORIDE: 101 mmol/L (ref 96–106)
CO2: 24 mmol/L (ref 20–29)
Calcium: 9.5 mg/dL (ref 8.6–10.2)
Creatinine, Ser: 1.97 mg/dL — ABNORMAL HIGH (ref 0.76–1.27)
GFR, EST AFRICAN AMERICAN: 35 mL/min/{1.73_m2} — AB (ref >59)
GFR, EST NON AFRICAN AMERICAN: 31 mL/min/{1.73_m2} — AB (ref >59)
Glucose: 114 mg/dL — ABNORMAL HIGH (ref 65–99)
Potassium: 4.8 mmol/L (ref 3.5–5.2)
Sodium: 141 mmol/L (ref 134–144)

## 2018-01-07 NOTE — Patient Instructions (Signed)
Medication Instructions:  Your physician recommends that you continue on your current medications as directed. Please refer to the Current Medication list given to you today.  Labwork: Today: BMET  Follow-Up: Your physician wants you to follow-up in: 6 months with Dr. Radford Pax. You will receive a reminder letter in the mail two months in advance. If you don't receive a letter, please call our office to schedule the follow-up appointment.  If you need a refill on your cardiac medications before your next appointment, please call your pharmacy.

## 2018-01-07 NOTE — Progress Notes (Signed)
Cardiology Office Note:    Date:  01/07/2018   ID:  [name], DOB Feb 05, 1933, MRN [id number]  PCP:  [name], MD  Cardiologist:  [name], MD    Referring MD: [name], MD   Chief Complaint  Patient presents with  . Coronary Artery Disease  . Hypertension  . Atrial Fibrillation  . Hyperlipidemia  . Cardiomyopathy    History of Present Illness:    [name] is a 84 y.o. male with a hx of hypertension, aortic stenosis status post AVR, chronic combined systolic and diastolic CHF in the setting of A. fib with RVR, orthostatic hypotension, dilated cardiomyopathy EF 25-30% (initially concerned for TTP amyloidosis but [name]-PYP scan 06/2017 was not consistent with amyloid and SPEP/UPEP with no M spike).  He is s/p CRT-D.  He has a h/o ASACAD s/p CABG, and paroxysmal atrial fibrillation.  His atrial fibrillation has been cardioverted in the past and is on amiodarone for suppression. He had CRT-D upgrade on 04/22/15.   Cardiac cath in February 2019 showed moderate pulmonary hypertension consistent with a mixture of pulmonary venous and pulmonary arterial hypertension (WHO group 2 and 3) with normal cardiac output.  Cath also showed three-vessel CAD with patent LIMA to the LAD, SVG to diagonal and SVG to the RCA.  He was having VT below his detection limit.  Device limits were changed on the last EP visit and he was put on mexiletine.  He was recently hospitalized for systolic heart failure with aggressive diuresis and thoracentesis.  He had a wide-complex tachycardia during hospitalization at a rate of approximately 110 bpm.  He was seen by EP in the end of July and had noted to have one episode of VT on 10/18/2017 resulting in ICD shock.  He is here today for followup and is doing well.  He denies any chest pain or pressure, SOB, DOE, PND, orthopnea, LE edema, dizziness, palpitations or syncope. He is compliant with his meds and is tolerating meds with no SE.    Past Medical  History:  Diagnosis Date  . Arthritis    "minor everywhere" (04/22/2015)  . Asthma    "a touch"  . Barrett esophagus   . CAD (coronary artery disease)    a. s/p 2 vessel CABG with LIMA to LAD, SVG to diagonal 1, SVG to RCA 12/09.  . Cardiac resynchronization therapy defibrillator (CRT-D) in place   . Chronic anticoagulation 01/23/2013  . Chronic combined systolic and diastolic CHF (congestive heart failure) (HCC)    dry weight 197-200lbs.  . CKD (chronic kidney disease) stage 3, GFR 30-59 ml/min (HCC) 08/23/2014  . Coronary artery disease 01/23/2013  . Dyslipidemia 01/23/2013  . Dyspnea   . Essential hypertension 01/23/2013  . GERD (gastroesophageal reflux disease)   . GI bleed 05/08/2017  . H/O: rheumatic fever   . Hepatitis 1957   "don't know what kind:  . History of hiatal hernia   . History of PFTs    a. Amiodarone started 5/16 >> PFTs w/ DLCO 6/16:  FEV1 72% predicted, FEV1/FVC 66%, DLCO 66% >> minimal reversible obstructive airways disease with mild diffusion defect (suggestive of emphysema but absence of hyperinflation inconsistent with dx)  . HTN (hypertension)   . Hypercholesteremia   . Hyperkalemia 08/23/2014  . Hyperthyroidism 05/26/2017  . Leg weakness 01/28/2017  . Multiple thyroid nodules 08/13/2017  . NICM (nonischemic cardiomyopathy) ([place])    initially concerned for TTP amyloidosis but [name]-PYP scan 06/2017 was not consistent with amyloid  and SPEP/UPEP with no M spike. EF 20-25% by echo 2019  . Orthostatic hypotension   . Persistent atrial fibrillation 01/23/2013  . Pleural effusion on right 05/26/2017  . Pneumonia 08/2014   "dr thought I may have had a touch"  . Pre-diabetes   . Protein-calorie malnutrition, severe 05/27/2017  . S/P AVR (aortic valve replacement) 2009   a. severe AS s/p AVR with pericardial tissue valve 2009.  Marland Kitchen Shingles 08/23/2014  . VT (ventricular tachycardia) ([place]) 05/06/2017    Past Surgical History:  Procedure Laterality Date  . AORTIC VALVE  REPLACEMENT  with 23-mm Magna Ease pericardial valve, model number   with 23-mm Magna Ease pericardial valve, model [id number], serial number [id number]   . APPENDECTOMY  1940s  . BI-VENTRICULAR IMPLANTABLE CARDIOVERTER DEFIBRILLATOR  (CRT-D)  04/22/2015  . CARDIAC CATHETERIZATION N/A 11/04/2014   Procedure: Left Heart Cath and Cors/Grafts Angiography;  Surgeon: Leonie Man, MD;  Location: [facility];  Service: Cardiovascular;  Laterality: N/A;  . CARDIAC VALVE REPLACEMENT    . CARDIOVERSION N/A 01/27/2013   Procedure: CARDIOVERSION;  Surgeon: Sueanne Margarita, MD;  Location: [facility];  Service: Cardiovascular;  Laterality: N/A;  . CARDIOVERSION N/A 08/25/2014   Procedure: CARDIOVERSION;  Surgeon: Sanda Klein, MD;  Location: [facility];  Service: Cardiovascular;  Laterality: N/A;  . CATARACT EXTRACTION W/ INTRAOCULAR LENS  IMPLANT, BILATERAL Bilateral   . CHEST TUBE INSERTION Right 07/26/2017   Procedure: INSERTION PLEURAL DRAINAGE CATHETER - RIGHT;  Surgeon: Ivin Poot, MD;  Location: [facility];  Service: Thoracic;  Laterality: Right;  . CORONARY ARTERY BYPASS GRAFT  03/10/2008   x 3 Dr. Roxan Hockey  . EP IMPLANTABLE DEVICE N/A 04/22/2015   Procedure: BiV ICD Insertion CRT-D;  Surgeon: [name], MD;  Location: [facility];  Service: Cardiovascular;  Laterality: N/A;  . ESOPHAGOGASTRODUODENOSCOPY (EGD) WITH ESOPHAGEAL DILATION  X1  . ESOPHAGOGASTRODUODENOSCOPY (EGD) WITH PROPOFOL N/A 05/09/2017   Procedure: ESOPHAGOGASTRODUODENOSCOPY (EGD) WITH PROPOFOL;  Surgeon: Carol Ada, MD;  Location: [facility];  Service: Endoscopy;  Laterality: N/A;  . IR THORACENTESIS ASP PLEURAL SPACE W/IMG GUIDE  05/27/2017  . IR THORACENTESIS ASP PLEURAL SPACE W/IMG GUIDE  06/17/2017  . IR THORACENTESIS ASP PLEURAL SPACE W/IMG GUIDE  06/18/2017  . IR THORACENTESIS ASP PLEURAL SPACE W/IMG GUIDE  07/11/2017  . REMOVAL OF PLEURAL DRAINAGE CATHETER Right 09/13/2017   Procedure: REMOVAL  OF PLEURAL DRAINAGE CATHETER;  Surgeon: Ivin Poot, MD;  Location: [facility];  Service: Thoracic;  Laterality: Right;  . RIGHT HEART CATH AND CORONARY/GRAFT ANGIOGRAPHY N/A 05/31/2017   Procedure: RIGHT HEART CATH AND CORONARY/GRAFT ANGIOGRAPHY;  Surgeon: Jolaine Artist, MD;  Location: [facility];  Service: Cardiovascular;  Laterality: N/A;  . TONSILLECTOMY      Current Medications: Current Meds  Medication Sig  . albuterol (PROVENTIL HFA;VENTOLIN HFA) 108 (90 Base) MCG/ACT inhaler Inhale 1-2 puffs into the lungs every 6 (six) hours as needed for wheezing.   Marland Kitchen atorvastatin (LIPITOR) 20 MG tablet TAKE 1 TABLET (20 MG TOTAL) DAILY AT 6 PM.  . carvedilol (COREG) 12.5 MG tablet Take 1 tablet (12.5 mg total) by mouth 2 (two) times daily with a meal.  . ELIQUIS 2.5 MG TABS tablet TAKE 1 TABLET TWICE DAILY  . furosemide (LASIX) 40 MG tablet Take 1 tablet (40 mg total) by mouth daily.  . hydrALAZINE (APRESOLINE) 25 MG tablet Take 12.5 mg by mouth 2 (two) times daily.  . isosorbide mononitrate (IMDUR)  30 MG 24 hr tablet TAKE 1 TABLET (30 MG TOTAL) BY MOUTH DAILY.  Marland Kitchen mexiletine (MEXITIL) 150 MG capsule Take 2 capsules (300 mg total) by mouth 3 (three) times daily.  . Multiple Vitamin (MULTIVITAMIN WITH MINERALS) TABS tablet Take 1 tablet by mouth daily.   . Omega-3 Fatty Acids (FISH OIL) 1000 MG CAPS Take 2,000 mg by mouth 2 (two) times daily.  . potassium chloride SA (K-DUR,KLOR-CON) 20 MEQ tablet Take 1 tablet (20 mEq total) by mouth daily.  . ranitidine (ZANTAC) 300 MG tablet Take 300 mg by mouth at bedtime.  . traMADol (ULTRAM) 50 MG tablet Take 1 tablet (50 mg total) by mouth every 12 (twelve) hours as needed for moderate pain or severe pain.     Allergies:   Novocain [procaine] and Amiodarone   Social History   Socioeconomic History  . Marital status: Married    Spouse name: Not on file  . Number of children: Not on file  . Years of education: Not on file  . Highest  education level: Not on file  Occupational History  . Not on file  Social Needs  . Financial resource strain: Not on file  . Food insecurity:    Worry: Not on file    Inability: Not on file  . Transportation needs:    Medical: Not on file    Non-medical: Not on file  Tobacco Use  . Smoking status: Never Smoker  . Smokeless tobacco: Never Used  Substance and Sexual Activity  . Alcohol use: Not Currently    Comment: 04/22/2015 "nothing since the 1980s; never had a problem w/it"  . Drug use: No  . Sexual activity: Never  Lifestyle  . Physical activity:    Days per week: Not on file    Minutes per session: Not on file  . Stress: Not on file  Relationships  . Social connections:    Talks on phone: Not on file    Gets together: Not on file    Attends religious service: Not on file    Active member of club or organization: Not on file    Attends meetings of clubs or organizations: Not on file    Relationship status: Not on file  Other Topics Concern  . Not on file  Social History Narrative   Married, lives with spouse [name]   4 children, 2 boys and 2 girls > one daughter passed   OCCUPATION: retired, Pension scheme manager for [employer]     Family History: The patient's family history includes Alzheimer's disease in his mother and unknown relative; COPD in his daughter and sister; Depression in his sister; Esophageal cancer in his sister; Heart disease in his sister; Hyperlipidemia in his sister; Hypertension in his sister.  ROS:   Please see the history of present illness.    ROS  All other systems reviewed and negative.   EKGs/Labs/Other Studies Reviewed:    The following studies were reviewed today: none  EKG:  EKG is not ordered today.    Recent Labs: 06/14/2017: NT-Pro BNP 23,607 07/11/2017: B Natriuretic Peptide 683.7 07/26/2017: ALT 44; Hemoglobin 11.4; Platelets 307 10/18/2017: Magnesium 2.5 10/22/2017: BUN 26; Creatinine, Ser 1.86; Potassium 4.7; Sodium  138 12/17/2017: TSH 3.71   Recent Lipid Panel    Component Value Date/Time   CHOL 126 07/30/2016 1143   CHOL 93 (L) 06/16/2014 0914   TRIG 154 (H) 07/30/2016 1143   TRIG 124 06/16/2014 0914   HDL 45 07/30/2016 1143  HDL 34 (L) 06/16/2014 0914   CHOLHDL 2.8 07/30/2016 1143   CHOLHDL 2.7 01/19/2015 1005   VLDL 32 (H) 01/19/2015 1005   LDLCALC 50 07/30/2016 1143   LDLCALC 34 06/16/2014 0914    Physical Exam:    VS:  BP 124/60   Pulse 65   Ht 6' (1.829 m)   Wt 185 lb 3.2 oz (84 kg)   SpO2 99%   BMI 25.12 kg/m     Wt Readings from Last 3 Encounters:  01/07/18 185 lb 3.2 oz (84 kg)  12/17/17 185 lb 6.4 oz (84.1 kg)  11/04/17 186 lb (84.4 kg)     GEN:  Well nourished, well developed in no acute distress HEENT: Normal NECK: No JVD; No carotid bruits LYMPHATICS: No lymphadenopathy CARDIAC: RRR, no murmurs, rubs, gallops RESPIRATORY:  Clear to auscultation without rales, wheezing or rhonchi  ABDOMEN: Soft, non-tender, non-distended MUSCULOSKELETAL:  No edema; No deformity  SKIN: Warm and dry NEUROLOGIC:  Alert and oriented x 3 PSYCHIATRIC:  Normal affect   ASSESSMENT:    1. Chronic combined systolic and diastolic heart failure ()   2. Coronary artery disease involving native coronary artery of native heart without angina pectoris   3. Essential hypertension   4. DCM (dilated cardiomyopathy) (HCC)   5. Persistent atrial fibrillation   6. VT (ventricular tachycardia) ([place])   7. Dyslipidemia    PLAN:    In order of problems listed above:  1.  Chronic combined systolic/diastolic CHF - DCM initially concerned for TTP amyloidosis but [name]-PYP scan 06/2017 was not consistent with amyloid and SPEP/UPEP with no M spike.  He is followed in advanced heart failure clinic.  Cath 05/2017 showed moderate pulmonary hypertension due to mixture of pulmonary venous and pulmonary arterial hypertension (WHO group 2 and 3) and normal cardiac output.    He actually appears euvolemic on  exam today.  His weight has been stable.   His last creatinine on 10/22/2017 was 1.86 and potassium 4.7. Creatinine was stable at 1.26 on 07/26/2017.   His creatinine back in April was 1.26 but recently has ranged from 1.75-2.07.  He will continue on carvedilol 12.5 mg twice daily and hydralazine 12.5 mg twice daily along with Imdur 30 mg daily.  He is not on ACE/ARB/Entresto due to chronic kidney disease.  He will continue on Lasix 40 mg daily I will repeat a bmet today.  2.  ASCAD - cath 05/2017 showed showed three-vessel CAD with a patent LIMA-LAD, SVG-diagonal and SVG-RCA.   He has not had any anginal symptoms recently.  He will continue on Imdur 30 mg daily, statin as well as beta-blocker.  He is not on aspirin due to DOAC.  3.  HTN - his BP is well controlled on exam today.   He will continue on carvedilol 12.5 mg twice daily, hydralazine 12.5 mg twice daily.  4.  Dilated cardiomyopathy- initially concerned for TTP amyloidosis but [name]-PYP scan 06/2017 was not consistent with amyloid and SPEP/UPEP with no M spike. He is followed in advanced heart failure clinic.  EF 20 to 25% on 05/27/2017 with akinesis of the inferior lateral, inferior and inferior septal myocardium.   Status post CRT-D followed in device clinic.  5.  Persistent atrial fibrillation -he remains in normal sinus rhythm on exam today. He will continue on carvedilol 12.5 mg twice daily and Eliquis 2.5 mg twice daily dosed for age greater than 25 and creatinine >1.5.  He is now off amiodarone due to problems  with prior shortness of breath.  6.  VT -he has not had any further ventricular arrhythmias.    Amiodarone had to be stopped due to significant dyspnea on exertion.  Dr. Curt Bears to discuss with him at his last office visit in July the possibility of considering VT ablation.  The patient is still not decided on what he wants to do and will see Dr. Curt Bears later this month to discuss further.  He will continue on mexiletine 300 mg 3 times  daily and carvedilol.  7.  Dyslipidemia -LDL goal less than 70.    His LDL was 49 on 10/15/2017.  He will continue on atorvastatin 20 mg daily.  Medication Adjustments/Labs and Tests Ordered: Current medicines are reviewed at length with the patient today.  Concerns regarding medicines are outlined above.  No orders of the defined types were placed in this encounter.  No orders of the defined types were placed in this encounter.   Signed, [name], MD  01/07/2018 11:02 AM    [place]

## 2018-01-08 DIAGNOSIS — Z23 Encounter for immunization: Secondary | ICD-10-CM | POA: Diagnosis not present

## 2018-01-13 ENCOUNTER — Telehealth: Payer: Self-pay

## 2018-01-13 DIAGNOSIS — I10 Essential (primary) hypertension: Secondary | ICD-10-CM

## 2018-01-13 DIAGNOSIS — I2510 Atherosclerotic heart disease of native coronary artery without angina pectoris: Secondary | ICD-10-CM

## 2018-01-13 DIAGNOSIS — Z79899 Other long term (current) drug therapy: Secondary | ICD-10-CM

## 2018-01-13 NOTE — Telephone Encounter (Signed)
The patient has been notified of the result and verbalized understanding. The patient agreed to have BMET on 10/29.  All questions (if any) were answered. Sarina Ill, RN 01/13/2018 10:02 AM     Notes recorded by Sueanne Margarita, MD on 01/10/2018 at 4:21 PM EDT Repeat BMET in 2 weeks to make sure creatinine stable ------  Notes recorded by Sarina Ill, RN on 01/10/2018 at 3:12 PM EDT Patient is not taking any NSAIDS ------  Notes recorded by Sueanne Margarita, MD on 01/08/2018 at 10:35 AM EDT Creatinine mildly increased from last check. Please make sure patient is not taking any NSAIDS

## 2018-02-04 ENCOUNTER — Ambulatory Visit: Payer: Medicare HMO | Admitting: Cardiology

## 2018-02-04 ENCOUNTER — Other Ambulatory Visit: Payer: Medicare HMO

## 2018-02-04 ENCOUNTER — Encounter: Payer: Self-pay | Admitting: Cardiology

## 2018-02-04 VITALS — BP 146/80 | HR 78 | Ht 72.0 in | Wt 184.8 lb

## 2018-02-04 DIAGNOSIS — I255 Ischemic cardiomyopathy: Secondary | ICD-10-CM

## 2018-02-04 DIAGNOSIS — I472 Ventricular tachycardia, unspecified: Secondary | ICD-10-CM

## 2018-02-04 DIAGNOSIS — I25810 Atherosclerosis of coronary artery bypass graft(s) without angina pectoris: Secondary | ICD-10-CM | POA: Diagnosis not present

## 2018-02-04 DIAGNOSIS — I5022 Chronic systolic (congestive) heart failure: Secondary | ICD-10-CM | POA: Diagnosis not present

## 2018-02-04 DIAGNOSIS — I480 Paroxysmal atrial fibrillation: Secondary | ICD-10-CM | POA: Diagnosis not present

## 2018-02-04 NOTE — Progress Notes (Signed)
Electrophysiology Office Note   Date:  02/04/2018   ID:  [name], DOB 1932/11/01, MRN [id number]  PCP:  [name], MD  Cardiologist:  [name] Primary Electrophysiologist: Tarvis Blossom Meredith Leeds, MD    No chief complaint on file.    History of Present Illness: [name] is a 84 y.o. male who presents today for electrophysiology evaluation.   He has a history of hypertension, aortic stenosis status post AVR, chronic combined systolic and diastolic CHF in the setting of A. fib with RVR, orthostatic hypotension, dilated cardiomyopathy EF 25-30%, status post CABG, and paroxysmal atrial fibrillation.  He does have paroxysmal atrial fibrillation has been cardioverted in the past and is now on a fixed within with rhythm control via amiodarone. He had CRT-D upgrade on 04/22/15.  He was having VT below his detection limit.  Device limits were changed in the last visit and he was put on mexiletine.  He was recently hospitalized for systolic heart failure with aggressive diuresis and thoracentesis.  He had a wide-complex tachycardia during hospitalization at a rate of approximately 110 bpm.  Today, denies symptoms of palpitations, chest pain, shortness of breath, orthopnea, PND, lower extremity edema, claudication, dizziness, presyncope, syncope, bleeding, or neurologic sequela. The patient is tolerating medications without difficulties.  Overall he is doing well.  He has had no further episodes of ventricular arrhythmias.  He is not passed out.  He is unaware of any further ICD discharges.  His respiratory status is good.  He has having some abdominal pain that occurs with breathing.  He feels that this happens at times when his volume status is low.  Past Medical History:  Diagnosis Date  . Arthritis    "minor everywhere" (04/22/2015)  . Asthma    "a touch"  . Barrett esophagus   . CAD (coronary artery disease)    a. s/p 2 vessel CABG with LIMA to LAD, SVG to diagonal 1, SVG  to RCA 12/09.  . Cardiac resynchronization therapy defibrillator (CRT-D) in place   . Chronic anticoagulation 01/23/2013  . Chronic combined systolic and diastolic CHF (congestive heart failure) (HCC)    dry weight 197-200lbs.  . CKD (chronic kidney disease) stage 3, GFR 30-59 ml/min (HCC) 08/23/2014  . Coronary artery disease 01/23/2013  . Dyslipidemia 01/23/2013  . Dyspnea   . Essential hypertension 01/23/2013  . GERD (gastroesophageal reflux disease)   . GI bleed 05/08/2017  . H/O: rheumatic fever   . Hepatitis 1957   "don't know what kind:  . History of hiatal hernia   . History of PFTs    a. Amiodarone started 5/16 >> PFTs w/ DLCO 6/16:  FEV1 72% predicted, FEV1/FVC 66%, DLCO 66% >> minimal reversible obstructive airways disease with mild diffusion defect (suggestive of emphysema but absence of hyperinflation inconsistent with dx)  . HTN (hypertension)   . Hypercholesteremia   . Hyperkalemia 08/23/2014  . Hyperthyroidism 05/26/2017  . Leg weakness 01/28/2017  . Multiple thyroid nodules 08/13/2017  . NICM (nonischemic cardiomyopathy) ([place])    initially concerned for TTP amyloidosis but Tc-PYP scan 06/2017 was not consistent with amyloid and SPEP/UPEP with no M spike. EF 20-25% by echo 2019  . Orthostatic hypotension   . Persistent atrial fibrillation 01/23/2013  . Pleural effusion on right 05/26/2017  . Pneumonia 08/2014   "dr thought I may have had a touch"  . Pre-diabetes   . Protein-calorie malnutrition, severe 05/27/2017  . S/P AVR (aortic valve replacement) 2009   a. severe  AS s/p AVR with pericardial tissue valve 2009.  Marland Kitchen Shingles 08/23/2014  . VT (ventricular tachycardia) ([place]) 05/06/2017   Past Surgical History:  Procedure Laterality Date  . AORTIC VALVE REPLACEMENT  with 23-mm Magna Ease pericardial valve, model number   with 23-mm Magna Ease pericardial valve, model [id number], serial number [id number]   . APPENDECTOMY  1940s  . BI-VENTRICULAR IMPLANTABLE CARDIOVERTER  DEFIBRILLATOR  (CRT-D)  04/22/2015  . CARDIAC CATHETERIZATION N/A 11/04/2014   Procedure: Left Heart Cath and Cors/Grafts Angiography;  Surgeon: Leonie Man, MD;  Location: [place] [facility];  Service: Cardiovascular;  Laterality: N/A;  . CARDIAC VALVE REPLACEMENT    . CARDIOVERSION N/A 01/27/2013   Procedure: CARDIOVERSION;  Surgeon: Sueanne Margarita, MD;  Location: [place];  Service: Cardiovascular;  Laterality: N/A;  . CARDIOVERSION N/A 08/25/2014   Procedure: CARDIOVERSION;  Surgeon: Sanda Klein, MD;  Location: [facility];  Service: Cardiovascular;  Laterality: N/A;  . CATARACT EXTRACTION W/ INTRAOCULAR LENS  IMPLANT, BILATERAL Bilateral   . CHEST TUBE INSERTION Right 07/26/2017   Procedure: INSERTION PLEURAL DRAINAGE CATHETER - RIGHT;  Surgeon: Ivin Poot, MD;  Location: [place];  Service: Thoracic;  Laterality: Right;  . CORONARY ARTERY BYPASS GRAFT  03/10/2008   x 3 Dr. Roxan Hockey  . EP IMPLANTABLE DEVICE N/A 04/22/2015   Procedure: BiV ICD Insertion CRT-D;  Surgeon: Rucker Pridgeon Meredith Leeds, MD;  Location: [place] [facility];  Service: Cardiovascular;  Laterality: N/A;  . ESOPHAGOGASTRODUODENOSCOPY (EGD) WITH ESOPHAGEAL DILATION  X1  . ESOPHAGOGASTRODUODENOSCOPY (EGD) WITH PROPOFOL N/A 05/09/2017   Procedure: ESOPHAGOGASTRODUODENOSCOPY (EGD) WITH PROPOFOL;  Surgeon: Carol Ada, MD;  Location: [place];  Service: Endoscopy;  Laterality: N/A;  . IR THORACENTESIS ASP PLEURAL SPACE W/IMG GUIDE  05/27/2017  . IR THORACENTESIS ASP PLEURAL SPACE W/IMG GUIDE  06/17/2017  . IR THORACENTESIS ASP PLEURAL SPACE W/IMG GUIDE  06/18/2017  . IR THORACENTESIS ASP PLEURAL SPACE W/IMG GUIDE  07/11/2017  . REMOVAL OF PLEURAL DRAINAGE CATHETER Right 09/13/2017   Procedure: REMOVAL OF PLEURAL DRAINAGE CATHETER;  Surgeon: Ivin Poot, MD;  Location: [place];  Service: Thoracic;  Laterality: Right;  . RIGHT HEART CATH AND CORONARY/GRAFT ANGIOGRAPHY N/A 05/31/2017   Procedure: RIGHT HEART CATH AND  CORONARY/GRAFT ANGIOGRAPHY;  Surgeon: Jolaine Artist, MD;  Location: [place] [facility];  Service: Cardiovascular;  Laterality: N/A;  . TONSILLECTOMY       Current Outpatient Medications  Medication Sig Dispense Refill  . albuterol (PROVENTIL HFA;VENTOLIN HFA) 108 (90 Base) MCG/ACT inhaler Inhale 1-2 puffs into the lungs every 6 (six) hours as needed for wheezing.     Marland Kitchen atorvastatin (LIPITOR) 20 MG tablet TAKE 1 TABLET (20 MG TOTAL) DAILY AT 6 PM. 90 tablet 3  . carvedilol (COREG) 12.5 MG tablet Take 1 tablet (12.5 mg total) by mouth 2 (two) times daily with a meal. 180 tablet 2  . ELIQUIS 2.5 MG TABS tablet TAKE 1 TABLET TWICE DAILY 180 tablet 2  . furosemide (LASIX) 40 MG tablet Take 1 tablet (40 mg total) by mouth daily. 90 tablet 3  . hydrALAZINE (APRESOLINE) 25 MG tablet Take 12.5 mg by mouth 2 (two) times daily.    . isosorbide mononitrate (IMDUR) 30 MG 24 hr tablet TAKE 1 TABLET (30 MG TOTAL) BY MOUTH DAILY. 90 tablet 3  . mexiletine (MEXITIL) 150 MG capsule Take 2 capsules (300 mg total) by mouth 3 (three) times daily. 180 capsule 11  . Multiple Vitamin (MULTIVITAMIN WITH MINERALS) TABS tablet  Take 1 tablet by mouth daily.     . Omega-3 Fatty Acids (FISH OIL) 1000 MG CAPS Take 2,000 mg by mouth 2 (two) times daily.    . potassium chloride SA (K-DUR,KLOR-CON) 20 MEQ tablet Take 1 tablet (20 mEq total) by mouth daily. 90 tablet 3  . ranitidine (ZANTAC) 300 MG tablet Take 300 mg by mouth at bedtime.    . traMADol (ULTRAM) 50 MG tablet Take 1 tablet (50 mg total) by mouth every 12 (twelve) hours as needed for moderate pain or severe pain. 20 tablet 0   No current facility-administered medications for this visit.     Allergies:   Novocain [procaine] and Amiodarone   Social History:  The patient  reports that he has never smoked. He has never used smokeless tobacco. He reports that he drank alcohol. He reports that he does not use drugs.   Family History:  The patient's family  history includes Alzheimer's disease in his mother and unknown relative; COPD in his daughter and sister; Depression in his sister; Esophageal cancer in his sister; Heart disease in his sister; Hyperlipidemia in his sister; Hypertension in his sister.   ROS:  Please see the history of present illness.   Otherwise, review of systems is positive for hearing loss, visual changes, abdominal pain, dizziness.   All other systems are reviewed and negative.   PHYSICAL EXAM: VS:  BP (!) 146/80   Pulse 78   Ht 6' (1.829 m)   Wt 184 lb 12.8 oz (83.8 kg)   SpO2 97%   BMI 25.06 kg/m  , BMI Body mass index is 25.06 kg/m. GEN: Well nourished, well developed, in no acute distress  HEENT: normal  Neck: no JVD, carotid bruits, or masses Cardiac: RRR; no murmurs, rubs, or gallops,no edema  Respiratory:  clear to auscultation bilaterally, normal work of breathing GI: soft, nontender, nondistended, + BS MS: no deformity or atrophy  Skin: warm and dry, device site well healed Neuro:  Strength and sensation are intact Psych: euthymic mood, full affect  EKG:  EKG is ordered today. Personal review of the ekg ordered shows AV paced, rate 78  Personal review of the device interrogation today. Results in [facility]: 06/14/2017: NT-Pro BNP 23,607 07/11/2017: B Natriuretic Peptide 683.7 07/26/2017: ALT 44; Hemoglobin 11.4; Platelets 307 10/18/2017: Magnesium 2.5 12/17/2017: TSH 3.71 01/07/2018: BUN 31; Creatinine, Ser 1.97; Potassium 4.8; Sodium 141    Lipid Panel     Component Value Date/Time   CHOL 126 07/30/2016 1143   CHOL 93 (L) 06/16/2014 0914   TRIG 154 (H) 07/30/2016 1143   TRIG 124 06/16/2014 0914   HDL 45 07/30/2016 1143   HDL 34 (L) 06/16/2014 0914   CHOLHDL 2.8 07/30/2016 1143   CHOLHDL 2.7 01/19/2015 1005   VLDL 32 (H) 01/19/2015 1005   LDLCALC 50 07/30/2016 1143   LDLCALC 34 06/16/2014 0914     Wt Readings from Last 3 Encounters:  02/04/18 184 lb 12.8 oz (83.8 kg)    01/07/18 185 lb 3.2 oz (84 kg)  12/17/17 185 lb 6.4 oz (84.1 kg)      Other studies Reviewed: Additional studies/ records that were reviewed today include: TTE 05/29/17 Review of the above records today demonstrates:  - Left ventricle: The cavity size was normal. Wall thickness was   increased in a pattern of mild LVH. Systolic function was   severely reduced. The estimated ejection fraction was in the   range of 20%  to 25%. There is akinesis of the inferolateral,   inferior, and inferoseptal myocardium. Features are consistent   with a pseudonormal left ventricular filling pattern, with   concomitant abnormal relaxation and increased filling pressure   (grade 2 diastolic dysfunction). - Aortic valve: A pericardial tissue valve bioprosthesis was   present and functioning normally. Peak velocity (S): 226 cm/s.   Mean gradient (S): 10 mm Hg. Valve area (VTI): 1.52 cm^2. Valve   area (Vmax): 1.5 cm^2. Valve area (Vmean): 1.59 cm^2. - Mitral valve: Calcified annulus. Mildly thickened leaflets .   There was mild regurgitation. - Left atrium: The atrium was severely dilated. - Right ventricle: Pacer wire or catheter noted in right ventricle. - Pulmonary arteries: Systolic pressure was mildly increased. PA   peak pressure: 35 mm Hg (S).  RHC/LHC 05/31/17  Prox LAD to Mid LAD lesion is 50% stenosed.  Mid LAD to Dist LAD lesion is 25% stenosed.  1st Mrg lesion is 50% stenosed.  Prox RCA to Dist RCA lesion is 100% stenosed.  And is large.  Prox Graft to Mid Graft lesion is 30% stenosed.  And is large.  The flow in the graft is reversed.  There is competitive flow.  And is large.  There is competitive flow.  Ost 1st Diag to 1st Diag lesion is 90% stenosed.   Findings:  Ao = 145/67 (99)  RA = 6 RV = 70/9 PA = 69/30 (46) PCW = 26 v = 37 Fick cardiac output/index =4.7/2.3 PVR = 4.3 FA sat = 98% PA sat = 63%, 60%  Assessment:  1. 3v CAD with stable  revascularization with patent LIMA-LAD, SVG-diagonal and SVG-RCA 2. 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 Normal cardiac output - no evidence of thyrotoxicosis heart disease or PH   ASSESSMENT AND PLAN:  1.  Chronic combined systolic and diastolic congestive heart failure due to ischemic cardiomyopathy: Post Medtronic CRT-D implanted 04/22/2015.  Device functioning appropriately and he is on optimal medical therapy with hydralazine, indoor, Coreg, and mexiletine.  Does not appear volume overloaded.  Device interrogation shows that his volume status is normal at this time.  No changes.    2. Paroxysmal atrial fibrillation: On Eliquis and in sinus rhythm.  No changes.  This patients CHA2DS2-VASc Score and unadjusted Ischemic Stroke Rate (% per year) is equal to 7.2 % stroke rate/year from a score of 5  Above score calculated as 1 point each if present [CHF, HTN, DM, Vascular=MI/PAD/Aortic Plaque, Age if 65-74, or Male] Above score calculated as 2 points each if present [Age > 75, or Stroke/TIA/TE]  3.  Ventricular tachycardia: Found on device interrogation 10/18/2017 after an ICD shock.  He is on maximal doses of mexiletine.  He has had no further episodes of ventricular arrhythmias since that time.  I did offer him ablation previously, but he would like to wait and see if he has any further arrhythmias.  Continue mexiletine.  Continue no driving until 6 months after his ICD shock.  4.  Coronary artery disease status post CABG: No current chest pain.  Continue current management.  Current medicines are reviewed at length with the patient today.   The patient does not have concerns regarding his medicines.  The following changes were made today: None  Labs/ tests ordered today include:  Orders Placed This Encounter  Procedures  . EKG 12-Lead    Disposition:   FU with Abdo Denault  6 months  Signed, all of Hanson Medeiros Meredith Leeds, MD    02/04/2018 11:33 AM     [facility] [address] [place]  [place] [address] [phone] (office) [phone] (fax)

## 2018-02-04 NOTE — Patient Instructions (Signed)
Medication Instructions:  Your physician recommends that you continue on your current medications as directed. Please refer to the Current Medication list given to you today  If you need a refill on your cardiac medications before your next appointment, please call your pharmacy.   Lab work: None ordered  Testing/Procedures: None ordered   Follow-Up: At [facility], you and your health needs are our priority.  As part of our continuing mission to provide you with exceptional heart care, we have created designated Provider Care Teams.  These Care Teams include your primary Cardiologist (physician) and Advanced Practice Providers (APPs -  Physician Assistants and Nurse Practitioners) who all work together to provide you with the care you need, when you need it. You will need a follow up appointment in 6 months.  Please call our office 2 months in advance to schedule this appointment.  You may see [name], MD or one of the following Advanced Practice Providers on your designated Care Team:   Chanetta Marshall, NP . Tommye Standard, PA-C  Thank you for choosing [facility]!!   Trinidad Curet, RN [phone]

## 2018-02-11 ENCOUNTER — Ambulatory Visit (INDEPENDENT_AMBULATORY_CARE_PROVIDER_SITE_OTHER): Payer: Medicare HMO | Admitting: *Deleted

## 2018-02-11 DIAGNOSIS — I255 Ischemic cardiomyopathy: Secondary | ICD-10-CM | POA: Diagnosis not present

## 2018-02-12 NOTE — Progress Notes (Signed)
Remote ICD transmission.   

## 2018-02-16 ENCOUNTER — Encounter: Payer: Self-pay | Admitting: Cardiology

## 2018-03-25 DIAGNOSIS — I4891 Unspecified atrial fibrillation: Secondary | ICD-10-CM | POA: Diagnosis not present

## 2018-03-25 DIAGNOSIS — N183 Chronic kidney disease, stage 3 (moderate): Secondary | ICD-10-CM | POA: Diagnosis not present

## 2018-03-25 DIAGNOSIS — E7800 Pure hypercholesterolemia, unspecified: Secondary | ICD-10-CM | POA: Diagnosis not present

## 2018-03-25 DIAGNOSIS — I509 Heart failure, unspecified: Secondary | ICD-10-CM | POA: Diagnosis not present

## 2018-03-25 DIAGNOSIS — E1122 Type 2 diabetes mellitus with diabetic chronic kidney disease: Secondary | ICD-10-CM | POA: Diagnosis not present

## 2018-03-25 DIAGNOSIS — D500 Iron deficiency anemia secondary to blood loss (chronic): Secondary | ICD-10-CM | POA: Diagnosis not present

## 2018-03-25 DIAGNOSIS — E0590 Thyrotoxicosis, unspecified without thyrotoxic crisis or storm: Secondary | ICD-10-CM | POA: Diagnosis not present

## 2018-03-25 DIAGNOSIS — R42 Dizziness and giddiness: Secondary | ICD-10-CM | POA: Diagnosis not present

## 2018-03-25 DIAGNOSIS — E1121 Type 2 diabetes mellitus with diabetic nephropathy: Secondary | ICD-10-CM | POA: Diagnosis not present

## 2018-03-25 DIAGNOSIS — I2510 Atherosclerotic heart disease of native coronary artery without angina pectoris: Secondary | ICD-10-CM | POA: Diagnosis not present

## 2018-03-25 DIAGNOSIS — I10 Essential (primary) hypertension: Secondary | ICD-10-CM | POA: Diagnosis not present

## 2018-04-03 ENCOUNTER — Encounter: Payer: Self-pay | Admitting: Internal Medicine

## 2018-04-03 ENCOUNTER — Other Ambulatory Visit: Payer: Self-pay | Admitting: Cardiology

## 2018-04-03 NOTE — Progress Notes (Signed)
Received labs from [place], [place] - from 03/25/2018:  TSH 5.39 (0.34-4.50) HbA1c 5.9% BUN/Cr 36/2.26, GFR 28, Glu 128 All lab results will be scanned.  TSH is slightly high.  This could be related to resolving thyroiditis.  He has a visit with me in 2 weeks.  I will recheck his TFTs then.  No intervention needed for now.

## 2018-04-04 NOTE — Progress Notes (Signed)
Gave patient results and he verbalized an understanding-scheduled patient to get labs on same day as f/u 04/18/18-patient stated that would be best for him

## 2018-04-13 LAB — CUP PACEART REMOTE DEVICE CHECK
Battery Voltage: 2.97 V
Brady Statistic AP VP Percent: 68.04 %
Brady Statistic AS VP Percent: 30.58 %
Brady Statistic RV Percent Paced: 96.6 %
HighPow Impedance: 66 Ohm
Implantable Lead Implant Date: 20170113
Implantable Lead Implant Date: 20170113
Implantable Lead Location: 753858
Implantable Lead Location: 753860
Implantable Lead Model: 4298
Implantable Lead Model: 5076
Implantable Pulse Generator Implant Date: 20170113
Lead Channel Impedance Value: 342 Ohm
Lead Channel Impedance Value: 342 Ohm
Lead Channel Impedance Value: 399 Ohm
Lead Channel Impedance Value: 437 Ohm
Lead Channel Impedance Value: 646 Ohm
Lead Channel Impedance Value: 665 Ohm
Lead Channel Impedance Value: 665 Ohm
Lead Channel Pacing Threshold Amplitude: 0.375 V
Lead Channel Pacing Threshold Amplitude: 0.625 V
Lead Channel Pacing Threshold Pulse Width: 0.4 ms
Lead Channel Pacing Threshold Pulse Width: 0.4 ms
Lead Channel Pacing Threshold Pulse Width: 0.4 ms
Lead Channel Sensing Intrinsic Amplitude: 2.375 mV
Lead Channel Sensing Intrinsic Amplitude: 2.375 mV
Lead Channel Sensing Intrinsic Amplitude: 9.125 mV
Lead Channel Setting Pacing Amplitude: 1.5 V
Lead Channel Setting Pacing Pulse Width: 0.4 ms
Lead Channel Setting Pacing Pulse Width: 0.4 ms
Lead Channel Setting Sensing Sensitivity: 0.3 mV
MDC IDC LEAD IMPLANT DT: 20170113
MDC IDC LEAD LOCATION: 753859
MDC IDC MSMT BATTERY REMAINING LONGEVITY: 56 mo
MDC IDC MSMT LEADCHNL LV IMPEDANCE VALUE: 323 Ohm
MDC IDC MSMT LEADCHNL LV IMPEDANCE VALUE: 399 Ohm
MDC IDC MSMT LEADCHNL LV IMPEDANCE VALUE: 570 Ohm
MDC IDC MSMT LEADCHNL LV IMPEDANCE VALUE: 570 Ohm
MDC IDC MSMT LEADCHNL RV IMPEDANCE VALUE: 380 Ohm
MDC IDC MSMT LEADCHNL RV IMPEDANCE VALUE: 456 Ohm
MDC IDC MSMT LEADCHNL RV PACING THRESHOLD AMPLITUDE: 0.5 V
MDC IDC MSMT LEADCHNL RV SENSING INTR AMPL: 9.125 mV
MDC IDC SESS DTM: 20191105073325
MDC IDC SET LEADCHNL LV PACING AMPLITUDE: 1.5 V
MDC IDC SET LEADCHNL RV PACING AMPLITUDE: 2 V
MDC IDC STAT BRADY AP VS PERCENT: 0.93 %
MDC IDC STAT BRADY AS VS PERCENT: 0.46 %
MDC IDC STAT BRADY RA PERCENT PACED: 67.96 %

## 2018-04-17 DIAGNOSIS — I2510 Atherosclerotic heart disease of native coronary artery without angina pectoris: Secondary | ICD-10-CM | POA: Diagnosis not present

## 2018-04-17 DIAGNOSIS — E1121 Type 2 diabetes mellitus with diabetic nephropathy: Secondary | ICD-10-CM | POA: Diagnosis not present

## 2018-04-17 DIAGNOSIS — K2270 Barrett's esophagus without dysplasia: Secondary | ICD-10-CM | POA: Diagnosis not present

## 2018-04-17 DIAGNOSIS — I255 Ischemic cardiomyopathy: Secondary | ICD-10-CM | POA: Diagnosis not present

## 2018-04-17 DIAGNOSIS — I5042 Chronic combined systolic (congestive) and diastolic (congestive) heart failure: Secondary | ICD-10-CM | POA: Diagnosis not present

## 2018-04-17 DIAGNOSIS — E1122 Type 2 diabetes mellitus with diabetic chronic kidney disease: Secondary | ICD-10-CM | POA: Diagnosis not present

## 2018-04-17 DIAGNOSIS — I10 Essential (primary) hypertension: Secondary | ICD-10-CM | POA: Diagnosis not present

## 2018-04-17 DIAGNOSIS — E7800 Pure hypercholesterolemia, unspecified: Secondary | ICD-10-CM | POA: Diagnosis not present

## 2018-04-17 DIAGNOSIS — N184 Chronic kidney disease, stage 4 (severe): Secondary | ICD-10-CM | POA: Diagnosis not present

## 2018-04-17 DIAGNOSIS — I480 Paroxysmal atrial fibrillation: Secondary | ICD-10-CM | POA: Diagnosis not present

## 2018-04-17 DIAGNOSIS — I509 Heart failure, unspecified: Secondary | ICD-10-CM | POA: Diagnosis not present

## 2018-04-17 DIAGNOSIS — I4891 Unspecified atrial fibrillation: Secondary | ICD-10-CM | POA: Diagnosis not present

## 2018-04-18 ENCOUNTER — Ambulatory Visit: Payer: Medicare HMO | Admitting: Internal Medicine

## 2018-04-18 ENCOUNTER — Other Ambulatory Visit: Payer: Medicare HMO

## 2018-04-18 DIAGNOSIS — Z1211 Encounter for screening for malignant neoplasm of colon: Secondary | ICD-10-CM | POA: Diagnosis not present

## 2018-05-13 ENCOUNTER — Ambulatory Visit (INDEPENDENT_AMBULATORY_CARE_PROVIDER_SITE_OTHER): Payer: Medicare HMO

## 2018-05-13 DIAGNOSIS — I255 Ischemic cardiomyopathy: Secondary | ICD-10-CM | POA: Diagnosis not present

## 2018-05-13 DIAGNOSIS — I5022 Chronic systolic (congestive) heart failure: Secondary | ICD-10-CM

## 2018-05-15 LAB — CUP PACEART REMOTE DEVICE CHECK
Battery Voltage: 2.95 V
Brady Statistic RA Percent Paced: 26.97 %
Date Time Interrogation Session: 20200204072504
HIGH POWER IMPEDANCE MEASURED VALUE: 78 Ohm
Implantable Lead Implant Date: 20170113
Implantable Lead Implant Date: 20170113
Implantable Lead Location: 753858
Implantable Lead Location: 753859
Implantable Lead Model: 4298
Implantable Pulse Generator Implant Date: 20170113
Lead Channel Impedance Value: 342 Ohm
Lead Channel Impedance Value: 437 Ohm
Lead Channel Impedance Value: 589 Ohm
Lead Channel Impedance Value: 589 Ohm
Lead Channel Impedance Value: 722 Ohm
Lead Channel Impedance Value: 760 Ohm
Lead Channel Pacing Threshold Amplitude: 0.5 V
Lead Channel Pacing Threshold Amplitude: 0.625 V
Lead Channel Pacing Threshold Pulse Width: 0.4 ms
Lead Channel Pacing Threshold Pulse Width: 0.4 ms
Lead Channel Pacing Threshold Pulse Width: 0.4 ms
Lead Channel Sensing Intrinsic Amplitude: 14.375 mV
Lead Channel Setting Pacing Amplitude: 2 V
Lead Channel Setting Pacing Pulse Width: 0.4 ms
Lead Channel Setting Sensing Sensitivity: 0.3 mV
MDC IDC LEAD IMPLANT DT: 20170113
MDC IDC LEAD LOCATION: 753860
MDC IDC MSMT BATTERY REMAINING LONGEVITY: 48 mo
MDC IDC MSMT LEADCHNL LV IMPEDANCE VALUE: 342 Ohm
MDC IDC MSMT LEADCHNL LV IMPEDANCE VALUE: 380 Ohm
MDC IDC MSMT LEADCHNL LV IMPEDANCE VALUE: 399 Ohm
MDC IDC MSMT LEADCHNL LV IMPEDANCE VALUE: 437 Ohm
MDC IDC MSMT LEADCHNL LV IMPEDANCE VALUE: 456 Ohm
MDC IDC MSMT LEADCHNL LV IMPEDANCE VALUE: 760 Ohm
MDC IDC MSMT LEADCHNL LV PACING THRESHOLD AMPLITUDE: 0.5 V
MDC IDC MSMT LEADCHNL RA SENSING INTR AMPL: 0.375 mV
MDC IDC MSMT LEADCHNL RA SENSING INTR AMPL: 0.375 mV
MDC IDC MSMT LEADCHNL RV IMPEDANCE VALUE: 437 Ohm
MDC IDC MSMT LEADCHNL RV SENSING INTR AMPL: 14.375 mV
MDC IDC SET LEADCHNL LV PACING AMPLITUDE: 1.5 V
MDC IDC SET LEADCHNL RA PACING AMPLITUDE: 1.5 V
MDC IDC SET LEADCHNL RV PACING PULSEWIDTH: 0.4 ms
MDC IDC STAT BRADY AP VP PERCENT: 32.18 %
MDC IDC STAT BRADY AP VS PERCENT: 0.54 %
MDC IDC STAT BRADY AS VP PERCENT: 44.88 %
MDC IDC STAT BRADY AS VS PERCENT: 22.4 %
MDC IDC STAT BRADY RV PERCENT PACED: 77.25 %

## 2018-05-19 ENCOUNTER — Telehealth: Payer: Self-pay | Admitting: *Deleted

## 2018-05-19 NOTE — Telephone Encounter (Signed)
-----  Message from [name], MD sent at 05/19/2018  9:49 AM EST ----- Abnormal device interrogation reviewed.  Lead parameters and battery status stable.  Nonsustained VT treated with ATP as well as persistent atrial fibrillation.  Needs to return to clinic to discuss further.  No driving for 6 months.

## 2018-05-19 NOTE — Telephone Encounter (Signed)
LMOVM requesting call back to the DC. Gave direct number.  Will advise patient of results and of [place] DMV driving restrictions.

## 2018-05-20 NOTE — Telephone Encounter (Signed)
Spoke with pt informed him of driving restrictions x 6 months, pt voiced understanding informed pt that someone would be contacting him to schedule an apt with WC pt voiced understanding.

## 2018-05-22 NOTE — Progress Notes (Signed)
Remote ICD transmission.

## 2018-05-23 ENCOUNTER — Ambulatory Visit (INDEPENDENT_AMBULATORY_CARE_PROVIDER_SITE_OTHER): Payer: Medicare HMO | Admitting: Cardiology

## 2018-05-23 ENCOUNTER — Encounter: Payer: Self-pay | Admitting: Cardiology

## 2018-05-23 VITALS — BP 120/82 | HR 109 | Ht 72.0 in | Wt 194.0 lb

## 2018-05-23 DIAGNOSIS — I480 Paroxysmal atrial fibrillation: Secondary | ICD-10-CM | POA: Diagnosis not present

## 2018-05-23 DIAGNOSIS — I255 Ischemic cardiomyopathy: Secondary | ICD-10-CM

## 2018-05-23 MED ORDER — CARVEDILOL 25 MG PO TABS: 25.0000 mg | ORAL_TABLET | Freq: Two times a day (BID) | ORAL | 3 refills | Status: DC

## 2018-05-23 NOTE — Progress Notes (Signed)
Electrophysiology Office Note   Date:  05/23/2018   ID:  STYLIANOS [name], DOB 12-03-1932, MRN [id number]  PCP:  [name] Melter, MD  Cardiologist:  [name] Primary Electrophysiologist: [name] Sachse Meredith Leeds, MD    No chief complaint on file.    History of Present Illness: [name] is a 85 y.o. male who presents today for electrophysiology evaluation.   He has a history of hypertension, aortic stenosis status post AVR, chronic combined systolic and diastolic CHF in the setting of A. fib with RVR, orthostatic hypotension, dilated cardiomyopathy EF 25-30%, status post CABG, and paroxysmal atrial fibrillation.  He does have paroxysmal atrial fibrillation has been cardioverted in the past and is now on a fixed within with rhythm control via amiodarone. He had CRT-D upgrade on 04/22/15.  He was having VT below his detection limit.  Device limits were changed in the last visit and he was put on mexiletine.  He was recently hospitalized for systolic heart failure with aggressive diuresis and thoracentesis.  He had a wide-complex tachycardia during hospitalization at a rate of approximately 110 bpm.  He has had multiple episodes of ATP over the last few weeks.  Today, denies symptoms of palpitations, chest pain, shortness of breath, orthopnea, PND, lower extremity edema, claudication, dizziness, presyncope, syncope, bleeding, or neurologic sequela. The patient is tolerating medications without difficulties.  Overall he is feeling okay.  He continues to be mildly short of breath.  He is unaware that he is gotten therapy from his defibrillator.  Past Medical History:  Diagnosis Date  . Arthritis    "minor everywhere" (04/22/2015)  . Asthma    "a touch"  . Barrett esophagus   . CAD (coronary artery disease)    a. s/p 2 vessel CABG with LIMA to LAD, SVG to diagonal 1, SVG to RCA 12/09.  . Cardiac resynchronization therapy defibrillator (CRT-D) in place   . Chronic anticoagulation 01/23/2013   . Chronic combined systolic and diastolic CHF (congestive heart failure) (HCC)    dry weight 197-200lbs.  . CKD (chronic kidney disease) stage 3, GFR 30-59 ml/min (HCC) 08/23/2014  . Coronary artery disease 01/23/2013  . Dyslipidemia 01/23/2013  . Dyspnea   . Essential hypertension 01/23/2013  . GERD (gastroesophageal reflux disease)   . GI bleed 05/08/2017  . H/O: rheumatic fever   . Hepatitis 1957   "don't know what kind:  . History of hiatal hernia   . History of PFTs    a. Amiodarone started 5/16 >> PFTs w/ DLCO 6/16:  FEV1 72% predicted, FEV1/FVC 66%, DLCO 66% >> minimal reversible obstructive airways disease with mild diffusion defect (suggestive of emphysema but absence of hyperinflation inconsistent with dx)  . HTN (hypertension)   . Hypercholesteremia   . Hyperkalemia 08/23/2014  . Hyperthyroidism 05/26/2017  . Leg weakness 01/28/2017  . Multiple thyroid nodules 08/13/2017  . NICM (nonischemic cardiomyopathy) ([place])    initially concerned for TTP amyloidosis but Tc-PYP scan 06/2017 was not consistent with amyloid and SPEP/UPEP with no M spike. EF 20-25% by echo 2019  . Orthostatic hypotension   . Persistent atrial fibrillation 01/23/2013  . Pleural effusion on right 05/26/2017  . Pneumonia 08/2014   "dr thought I may have had a touch"  . Pre-diabetes   . Protein-calorie malnutrition, severe 05/27/2017  . S/P AVR (aortic valve replacement) 2009   a. severe AS s/p AVR with pericardial tissue valve 2009.  Marland Kitchen Shingles 08/23/2014  . VT (ventricular tachycardia) ([place]) 05/06/2017   Past  Surgical History:  Procedure Laterality Date  . AORTIC VALVE REPLACEMENT  with 23-mm Magna Ease pericardial valve, model number   with 23-mm Magna Ease pericardial valve, model [id number], serial number [id number]   . APPENDECTOMY  1940s  . BI-VENTRICULAR IMPLANTABLE CARDIOVERTER DEFIBRILLATOR  (CRT-D)  04/22/2015  . CARDIAC CATHETERIZATION N/A 11/04/2014   Procedure: Left Heart Cath and Cors/Grafts  Angiography;  Surgeon: [name] Man, MD;  Location: [facility];  Service: Cardiovascular;  Laterality: N/A;  . CARDIAC VALVE REPLACEMENT    . CARDIOVERSION N/A 01/27/2013   Procedure: CARDIOVERSION;  Surgeon: [name] Margarita, MD;  Location: [facility];  Service: Cardiovascular;  Laterality: N/A;  . CARDIOVERSION N/A 08/25/2014   Procedure: CARDIOVERSION;  Surgeon: [name] Klein, MD;  Location: [facility];  Service: Cardiovascular;  Laterality: N/A;  . CATARACT EXTRACTION W/ INTRAOCULAR LENS  IMPLANT, BILATERAL Bilateral   . CHEST TUBE INSERTION Right 07/26/2017   Procedure: INSERTION PLEURAL DRAINAGE CATHETER - RIGHT;  Surgeon: [name] Poot, MD;  Location: [facility];  Service: Thoracic;  Laterality: Right;  . CORONARY ARTERY BYPASS GRAFT  03/10/2008   x 3 Dr. Roxan Hockey  . EP IMPLANTABLE DEVICE N/A 04/22/2015   Procedure: BiV ICD Insertion CRT-D;  Surgeon: Jenai Scaletta Meredith Leeds, MD;  Location: [facility];  Service: Cardiovascular;  Laterality: N/A;  . ESOPHAGOGASTRODUODENOSCOPY (EGD) WITH ESOPHAGEAL DILATION  X1  . ESOPHAGOGASTRODUODENOSCOPY (EGD) WITH PROPOFOL N/A 05/09/2017   Procedure: ESOPHAGOGASTRODUODENOSCOPY (EGD) WITH PROPOFOL;  Surgeon: [name] Ada, MD;  Location: [facility];  Service: Endoscopy;  Laterality: N/A;  . IR THORACENTESIS ASP PLEURAL SPACE W/IMG GUIDE  05/27/2017  . IR THORACENTESIS ASP PLEURAL SPACE W/IMG GUIDE  06/17/2017  . IR THORACENTESIS ASP PLEURAL SPACE W/IMG GUIDE  06/18/2017  . IR THORACENTESIS ASP PLEURAL SPACE W/IMG GUIDE  07/11/2017  . REMOVAL OF PLEURAL DRAINAGE CATHETER Right 09/13/2017   Procedure: REMOVAL OF PLEURAL DRAINAGE CATHETER;  Surgeon: [name] Poot, MD;  Location: [facility];  Service: Thoracic;  Laterality: Right;  . RIGHT HEART CATH AND CORONARY/GRAFT ANGIOGRAPHY N/A 05/31/2017   Procedure: RIGHT HEART CATH AND CORONARY/GRAFT ANGIOGRAPHY;  Surgeon: [name] Artist, MD;  Location: [facility];  Service: Cardiovascular;   Laterality: N/A;  . TONSILLECTOMY       Current Outpatient Medications  Medication Sig Dispense Refill  . albuterol (PROVENTIL HFA;VENTOLIN HFA) 108 (90 Base) MCG/ACT inhaler Inhale 1-2 puffs into the lungs every 6 (six) hours as needed for wheezing.     Marland Kitchen atorvastatin (LIPITOR) 20 MG tablet TAKE 1 TABLET (20 MG TOTAL) DAILY AT 6 PM. 90 tablet 3  . ELIQUIS 2.5 MG TABS tablet TAKE 1 TABLET TWICE DAILY 180 tablet 2  . furosemide (LASIX) 40 MG tablet Take 1 tablet (40 mg total) by mouth daily. 90 tablet 3  . hydrALAZINE (APRESOLINE) 25 MG tablet TAKE 1/2 TABLET (12.5MG) BY MOUTH 2 (TWO) TIMES DAILY BEFORE MEALS 90 tablet 3  . isosorbide mononitrate (IMDUR) 30 MG 24 hr tablet TAKE 1 TABLET (30 MG TOTAL) BY MOUTH DAILY. 90 tablet 3  . mexiletine (MEXITIL) 150 MG capsule Take 2 capsules (300 mg total) by mouth 3 (three) times daily. 180 capsule 11  . Multiple Vitamin (MULTIVITAMIN WITH MINERALS) TABS tablet Take 1 tablet by mouth daily.     . Omega-3 Fatty Acids (FISH OIL) 1000 MG CAPS Take 2,000 mg by mouth 2 (two) times daily.    . potassium chloride SA (K-DUR,KLOR-CON) 20 MEQ tablet Take 1 tablet (20  mEq total) by mouth daily. 90 tablet 3  . ranitidine (ZANTAC) 300 MG tablet Take 300 mg by mouth at bedtime.    . traMADol (ULTRAM) 50 MG tablet Take 1 tablet (50 mg total) by mouth every 12 (twelve) hours as needed for moderate pain or severe pain. 20 tablet 0  . pantoprazole (PROTONIX) 40 MG tablet Take 1 tablet by mouth daily.     No current facility-administered medications for this visit.     Allergies:   Novocain [procaine] and Amiodarone   Social History:  The patient  reports that he has never smoked. He has never used smokeless tobacco. He reports previous alcohol use. He reports that he does not use drugs.   Family History:  The patient's family history includes Alzheimer's disease in his mother and unknown relative; COPD in his daughter and sister; Depression in his sister; Esophageal  cancer in his sister; Heart disease in his sister; Hyperlipidemia in his sister; Hypertension in his sister.   ROS:  Please see the history of present illness.   Otherwise, review of systems is positive for hearing loss, abdominal pain.   All other systems are reviewed and negative.   PHYSICAL EXAM: VS:  BP 120/82   Pulse (!) 109   Ht 6' (1.829 m)   Wt 194 lb (88 kg)   SpO2 99%   BMI 26.31 kg/m  , BMI Body mass index is 26.31 kg/m. GEN: Well nourished, well developed, in no acute distress  HEENT: normal  Neck: no JVD, carotid bruits, or masses Cardiac: iRRR; no murmurs, rubs, or gallops,no edema  Respiratory:  clear to auscultation bilaterally, normal work of breathing GI: soft, nontender, nondistended, + BS MS: no deformity or atrophy  Skin: warm and dry, device site well healed Neuro:  Strength and sensation are intact Psych: euthymic mood, full affect  EKG:  EKG is ordered today. Personal review of the ekg ordered shows wide complex tachycardia, rate 109  Personal review of the device interrogation today. Results in [facility]: 06/14/2017: NT-Pro BNP 23,607 07/11/2017: B Natriuretic Peptide 683.7 07/26/2017: ALT 44; Hemoglobin 11.4; Platelets 307 10/18/2017: Magnesium 2.5 12/17/2017: TSH 3.71 01/07/2018: BUN 31; Creatinine, Ser 1.97; Potassium 4.8; Sodium 141    Lipid Panel     Component Value Date/Time   CHOL 126 07/30/2016 1143   CHOL 93 (L) 06/16/2014 0914   TRIG 154 (H) 07/30/2016 1143   TRIG 124 06/16/2014 0914   HDL 45 07/30/2016 1143   HDL 34 (L) 06/16/2014 0914   CHOLHDL 2.8 07/30/2016 1143   CHOLHDL 2.7 01/19/2015 1005   VLDL 32 (H) 01/19/2015 1005   LDLCALC 50 07/30/2016 1143   LDLCALC 34 06/16/2014 0914     Wt Readings from Last 3 Encounters:  05/23/18 194 lb (88 kg)  02/04/18 184 lb 12.8 oz (83.8 kg)  01/07/18 185 lb 3.2 oz (84 kg)      Other studies Reviewed: Additional studies/ records that were reviewed today include: TTE  05/29/17 Review of the above records today demonstrates:  - Left ventricle: The cavity size was normal. Wall thickness was   increased in a pattern of mild LVH. Systolic function was   severely reduced. The estimated ejection fraction was in the   range of 20% to 25%. There is akinesis of the inferolateral,   inferior, and inferoseptal myocardium. Features are consistent   with a pseudonormal left ventricular filling pattern, with   concomitant abnormal relaxation and increased filling pressure   (  grade 2 diastolic dysfunction). - Aortic valve: A pericardial tissue valve bioprosthesis was   present and functioning normally. Peak velocity (S): 226 cm/s.   Mean gradient (S): 10 mm Hg. Valve area (VTI): 1.52 cm^2. Valve   area (Vmax): 1.5 cm^2. Valve area (Vmean): 1.59 cm^2. - Mitral valve: Calcified annulus. Mildly thickened leaflets .   There was mild regurgitation. - Left atrium: The atrium was severely dilated. - Right ventricle: Pacer wire or catheter noted in right ventricle. - Pulmonary arteries: Systolic pressure was mildly increased. PA   peak pressure: 35 mm Hg (S).  RHC/LHC 05/31/17  Prox LAD to Mid LAD lesion is 50% stenosed.  Mid LAD to Dist LAD lesion is 25% stenosed.  1st Mrg lesion is 50% stenosed.  Prox RCA to Dist RCA lesion is 100% stenosed.  And is large.  Prox Graft to Mid Graft lesion is 30% stenosed.  And is large.  The flow in the graft is reversed.  There is competitive flow.  And is large.  There is competitive flow.  Ost 1st Diag to 1st Diag lesion is 90% stenosed.   Findings:  Ao = 145/67 (99)  RA = 6 RV = 70/9 PA = 69/30 (46) PCW = 26 v = 37 Fick cardiac output/index =4.7/2.3 PVR = 4.3 FA sat = 98% PA sat = 63%, 60%  Assessment:  1. 3v CAD with stable revascularization with patent LIMA-LAD, SVG-diagonal and SVG-RCA 2. 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 Normal cardiac  output - no evidence of thyrotoxicosis heart disease or PH   ASSESSMENT AND PLAN:  1.  Chronic combined systolic and diastolic congestive heart failure due to ischemic cardiomyopathy: Status post Medtronic CRT-D implanted 04/22/2015.  Device functioning appropriately.  He is currently on mexiletine due to ventricular arrhythmias.  2.  Persistent atrial fibrillation: Currently on Eliquis.  He is in atrial fibrillation which has become persistent.  We Mackynzie Woolford plan for cardioversion.  He is in a wide-complex tachycardia that appears to be due to atrial fibrillation and ventricular pacing. This patients CHA2DS2-VASc Score and unadjusted Ischemic Stroke Rate (% per year) is equal to 7.2 % stroke rate/year from a score of 5  Above score calculated as 1 point each if present [CHF, HTN, DM, Vascular=MI/PAD/Aortic Plaque, Age if 65-74, or Male] Above score calculated as 2 points each if present [Age > 75, or Stroke/TIA/TE]  3.  Ventricular tachycardia: He has had no ICD shocks, but he has had ATP over the last few months.  He is also been in atrial fibrillation over the last few months.  Due to his episodes of ATP, I told [name] no driving for 6 months.  He is on mexiletine currently.  He refuses any other antiarrhythmics and we are also limited by his renal dysfunction.  We Merril Isakson plan to increase carvedilol.  4.  Coronary artery disease status post CABG: No current chest pain  Current medicines are reviewed at length with the patient today.   The patient does not have concerns regarding his medicines.  The following changes were made today: None  Labs/ tests ordered today include:  Orders Placed This Encounter  Procedures  . EKG 12-Lead    Disposition:   FU with Andon Villard 3 months  Signed, all of Kaybree Williams Meredith Leeds, MD  05/23/2018 12:26 PM     [place] [address] [place] [address] [phone] (office) [phone] (fax)

## 2018-05-23 NOTE — Patient Instructions (Addendum)
Medication Instructions:  .Your physician has recommended you make the following change in your medication:  1. INCREASE Carvedilol to 25 mg twice daily  *If you need a refill on your cardiac medications before your next appointment, please call your pharmacy*  Labwork: Pre procedure labs today: BMET & CBC  Testing/Procedures: Your physician has recommended that you have a Cardioversion (DCCV). Electrical Cardioversion uses a jolt of electricity to your heart either through paddles or wired patches attached to your chest. This is a controlled, usually prescheduled, procedure. Defibrillation is done under light anesthesia in the hospital, and you usually go home the day of the procedure. This is done to get your heart back into a normal rhythm. You are not awake for the procedure. Please see the instruction sheet given to you today.  CARDIOVERSION INSTRUCTIONS: You are scheduled for a cardioversion on 05/28/2018 with Dr. Debara Pickett or associates. Please go to [facility]  at 12:30 pm, Enter through the [facility] not have any food or drink after midnight the night before.  Hold your Lasix the morning of this procedure. You may take your remaining medicines with a sip of water on the day of your procedure.  You will need someone to drive you home following your procedure.   Follow-Up: Remote monitoring is used to monitor your Pacemaker or ICD from home. This monitoring reduces the number of office visits required to check your device to one time per year. It allows [place] to keep an eye on the functioning of your device to ensure it is working properly. You are scheduled for a device check from home on 08/19/2018. You may send your transmission at any time that day. If you have a wireless device, the transmission will be sent automatically. After your physician reviews your transmission, you will receive a postcard with your next transmission date.  Your physician recommends that you  schedule a follow-up appointment in: 3 months with Dr. Curt Bears.   Thank you for choosing [facility]!!   Trinidad Curet, RN [phone]

## 2018-05-23 NOTE — Addendum Note (Signed)
Addended by: Eulis Foster on: 05/23/2018 12:40 PM   Modules accepted: Orders

## 2018-05-24 LAB — CBC
Hematocrit: 46.6 % (ref 37.5–51.0)
Hemoglobin: 15 g/dL (ref 13.0–17.7)
MCH: 29.7 pg (ref 26.6–33.0)
MCHC: 32.2 g/dL (ref 31.5–35.7)
MCV: 92 fL (ref 79–97)
PLATELETS: 325 10*3/uL (ref 150–450)
RBC: 5.05 x10E6/uL (ref 4.14–5.80)
RDW: 12.8 % (ref 11.6–15.4)
WBC: 10.1 10*3/uL (ref 3.4–10.8)

## 2018-05-24 LAB — BASIC METABOLIC PANEL WITH GFR
BUN/Creatinine Ratio: 13 (ref 10–24)
BUN: 27 mg/dL (ref 8–27)
CO2: 23 mmol/L (ref 20–29)
Calcium: 9.5 mg/dL (ref 8.6–10.2)
Chloride: 97 mmol/L (ref 96–106)
Creatinine, Ser: 2.06 mg/dL — ABNORMAL HIGH (ref 0.76–1.27)
GFR calc Af Amer: 33 mL/min/{1.73_m2} — ABNORMAL LOW
GFR calc non Af Amer: 29 mL/min/{1.73_m2} — ABNORMAL LOW
Glucose: 130 mg/dL — ABNORMAL HIGH (ref 65–99)
Potassium: 5.3 mmol/L — ABNORMAL HIGH (ref 3.5–5.2)
Sodium: 138 mmol/L (ref 134–144)

## 2018-05-28 ENCOUNTER — Encounter (HOSPITAL_COMMUNITY): Payer: Self-pay | Admitting: Certified Registered Nurse Anesthetist

## 2018-05-28 ENCOUNTER — Encounter (HOSPITAL_COMMUNITY)
Admission: RE | Disposition: A | Discharge status: Home / Self Care | Payer: Self-pay | Source: Home / Self Care | Attending: Internal Medicine

## 2018-05-28 ENCOUNTER — Other Ambulatory Visit: Payer: Self-pay

## 2018-05-28 ENCOUNTER — Ambulatory Visit (HOSPITAL_COMMUNITY): Payer: Medicare HMO | Admitting: Certified Registered Nurse Anesthetist

## 2018-05-28 ENCOUNTER — Ambulatory Visit (HOSPITAL_COMMUNITY)
Admission: RE | Admit: 2018-05-28 | Discharge: 2018-05-28 | Disposition: A | Discharge status: Home / Self Care | Payer: Medicare HMO | Attending: Internal Medicine | Admitting: Internal Medicine

## 2018-05-28 DIAGNOSIS — Z7901 Long term (current) use of anticoagulants: Secondary | ICD-10-CM | POA: Insufficient documentation

## 2018-05-28 DIAGNOSIS — Z79899 Other long term (current) drug therapy: Secondary | ICD-10-CM | POA: Insufficient documentation

## 2018-05-28 DIAGNOSIS — I130 Hypertensive heart and chronic kidney disease with heart failure and stage 1 through stage 4 chronic kidney disease, or unspecified chronic kidney disease: Secondary | ICD-10-CM | POA: Insufficient documentation

## 2018-05-28 DIAGNOSIS — N183 Chronic kidney disease, stage 3 (moderate): Secondary | ICD-10-CM | POA: Diagnosis not present

## 2018-05-28 DIAGNOSIS — I472 Ventricular tachycardia: Secondary | ICD-10-CM | POA: Diagnosis not present

## 2018-05-28 DIAGNOSIS — I5042 Chronic combined systolic (congestive) and diastolic (congestive) heart failure: Secondary | ICD-10-CM | POA: Diagnosis not present

## 2018-05-28 DIAGNOSIS — K219 Gastro-esophageal reflux disease without esophagitis: Secondary | ICD-10-CM | POA: Insufficient documentation

## 2018-05-28 DIAGNOSIS — I509 Heart failure, unspecified: Secondary | ICD-10-CM | POA: Diagnosis not present

## 2018-05-28 DIAGNOSIS — I428 Other cardiomyopathies: Secondary | ICD-10-CM | POA: Diagnosis not present

## 2018-05-28 DIAGNOSIS — E0590 Thyrotoxicosis, unspecified without thyrotoxic crisis or storm: Secondary | ICD-10-CM | POA: Diagnosis not present

## 2018-05-28 DIAGNOSIS — I2510 Atherosclerotic heart disease of native coronary artery without angina pectoris: Secondary | ICD-10-CM | POA: Insufficient documentation

## 2018-05-28 DIAGNOSIS — I4891 Unspecified atrial fibrillation: Secondary | ICD-10-CM | POA: Diagnosis not present

## 2018-05-28 DIAGNOSIS — Z95810 Presence of automatic (implantable) cardiac defibrillator: Secondary | ICD-10-CM | POA: Diagnosis not present

## 2018-05-28 DIAGNOSIS — Z951 Presence of aortocoronary bypass graft: Secondary | ICD-10-CM | POA: Insufficient documentation

## 2018-05-28 DIAGNOSIS — I4819 Other persistent atrial fibrillation: Secondary | ICD-10-CM | POA: Diagnosis not present

## 2018-05-28 HISTORY — PX: CARDIOVERSION: SHX1299

## 2018-05-28 SURGERY — CARDIOVERSION
Anesthesia: General

## 2018-05-28 MED ORDER — SODIUM CHLORIDE 0.9 % IV SOLN
INTRAVENOUS | Status: DC | PRN
  Administered 2018-05-28: 13:00:00 via INTRAVENOUS

## 2018-05-28 MED ORDER — PROPOFOL 10 MG/ML IV BOLUS
INTRAVENOUS | Status: DC | PRN
  Administered 2018-05-28: 70 mg via INTRAVENOUS

## 2018-05-28 NOTE — H&P (Signed)
   INTERVAL PROCEDURE H&P  History and Physical Interval Note:  05/28/2018 12:27 PM  [name] has presented today for their planned procedure. The various methods of treatment have been discussed with the patient and family. After consideration of risks, benefits and other options for treatment, the patient has consented to the procedure.  The patients' outpatient history has been reviewed, patient examined, and no change in status from most recent office note within the past 30 days. I have reviewed the patients' chart and labs and will proceed as planned. Questions were answered to the patient's satisfaction.   Pixie Casino, MD, [facility], [place] Director of the Advanced Lipid Disorders &  Cardiovascular Risk Reduction Clinic Diplomate of the American Board of Clinical Lipidology Attending Cardiologist  Direct Dial: [phone]  Fax: [phone]  Website:  [email or web address] 05/28/2018, 12:27 PM

## 2018-05-28 NOTE — Anesthesia Preprocedure Evaluation (Addendum)
Anesthesia Evaluation  Patient identified by MRN, date of birth, ID band Patient awake    Reviewed: Allergy & Precautions, NPO status , Patient's Chart, lab work & pertinent test results  Airway Mallampati: II  TM Distance: >3 FB     Dental   Pulmonary shortness of breath, asthma , pneumonia,    breath sounds clear to auscultation       Cardiovascular hypertension, + CAD and +CHF   Rhythm:Irregular Rate:Normal     Neuro/Psych    GI/Hepatic hiatal hernia, GERD  ,(+) Hepatitis -  Endo/Other  Hyperthyroidism   Renal/GU Renal disease     Musculoskeletal   Abdominal   Peds  Hematology   Anesthesia Other Findings   Reproductive/Obstetrics                             Anesthesia Physical Anesthesia Plan  ASA: III  Anesthesia Plan: General   Post-op Pain Management:    Induction: Intravenous  PONV Risk Score and Plan: Propofol infusion and Treatment may vary due to age or medical condition  Airway Management Planned: Simple Face Mask and Nasal Cannula  Additional Equipment:   Intra-op Plan:   Post-operative Plan:   Informed Consent: I have reviewed the patients History and Physical, chart, labs and discussed the procedure including the risks, benefits and alternatives for the proposed anesthesia with the patient or authorized representative who has indicated his/her understanding and acceptance.     Dental advisory given  Plan Discussed with: CRNA and Anesthesiologist  Anesthesia Plan Comments:         Anesthesia Quick Evaluation

## 2018-05-28 NOTE — Discharge Instructions (Signed)
Electrical Cardioversion, Care After This sheet gives you information about how to care for yourself after your procedure. Your health care provider may also give you more specific instructions. If you have problems or questions, contact your health care provider. What can I expect after the procedure? After the procedure, it is common to have:  Some redness on the skin where the shocks were given. Follow these instructions at home:   Do not drive for 24 hours if you were given a medicine to help you relax (sedative).  Take over-the-counter and prescription medicines only as told by your health care provider.  Ask your health care provider how to check your pulse. Check it often.  Rest for 48 hours after the procedure or as told by your health care provider.  Avoid or limit your caffeine use as told by your health care provider. Contact a health care provider if:  You feel like your heart is beating too quickly or your pulse is not regular.  You have a serious muscle cramp that does not go away. Get help right away if:   You have discomfort in your chest.  You are dizzy or you feel faint.  You have trouble breathing or you are short of breath.  Your speech is slurred.  You have trouble moving an arm or leg on one side of your body.  Your fingers or toes turn cold or blue. This information is not intended to replace advice given to you by your health care provider. Make sure you discuss any questions you have with your health care provider. Document Released: 01/14/2013 Document Revised: 10/28/2015 Document Reviewed: 09/30/2015 Elsevier Interactive Patient Education  2019 [employer].

## 2018-05-28 NOTE — Anesthesia Procedure Notes (Signed)
Procedure Name: General with mask airway Date/Time: 05/28/2018 1:33 PM Performed by: Candis Shine, CRNA Pre-anesthesia Checklist: Patient identified, Emergency Drugs available, Suction available, Patient being monitored and Timeout performed Patient Re-evaluated:Patient Re-evaluated prior to induction Oxygen Delivery Method: Ambu bag Preoxygenation: Pre-oxygenation with 100% oxygen Induction Type: IV induction Dental Injury: Teeth and Oropharynx as per pre-operative assessment

## 2018-05-28 NOTE — Transfer of Care (Signed)
Immediate Anesthesia Transfer of Care Note  Patient: [name]  Procedure(s) Performed: CARDIOVERSION (N/A )  Patient Location: Endoscopy Unit  Anesthesia Type:General  Level of Consciousness: awake, alert  and oriented  Airway & Oxygen Therapy: Patient Spontanous Breathing  Post-op Assessment: Report given to RN and Post -op Vital signs reviewed and stable  Post vital signs: Reviewed and stable  Last Vitals:  Vitals Value Taken Time  BP 110/55 05/28/2018  1:43 PM  Temp    Pulse 60 05/28/2018  1:44 PM  Resp 18 05/28/2018  1:44 PM  SpO2 97 % 05/28/2018  1:44 PM    Last Pain:  Vitals:   05/28/18 1252  TempSrc: Oral  PainSc: 0-No pain         Complications: No apparent anesthesia complications

## 2018-05-28 NOTE — CV Procedure (Signed)
   CARDIOVERSION NOTE  Procedure: Electrical Cardioversion Indications:  Atrial Fibrillation  Procedure Details:  Consent: Risks of procedure as well as the alternatives and risks of each were explained to the (patient/caregiver).  Consent for procedure obtained.  Time Out: Verified patient identification, verified procedure, site/side was marked, verified correct patient position, special equipment/implants available, medications/allergies/relevent history reviewed, required imaging and test results available.  Performed  Patient placed on cardiac monitor, pulse oximetry, supplemental oxygen as necessary.  Sedation given: propofol per anesthesia Pacer pads placed anterior and posterior chest.  Cardioverted 1 time(s).  Cardioverted at 150J biphasic.  Impression: Findings: Post procedure EKG shows: NSR Complications: None Patient did tolerate procedure well.  Plan: 1. Successful DCCV to NSR (confirmed on intracardiac electrocardiogram) with a single 150J biphasic shock. 2. Follow-up with Dr. Curt Bears in EP.  Time Spent Directly with the Patient:  30 minutes   Pixie Casino, MD, [facility], [place] Director of the Advanced Lipid Disorders &  Cardiovascular Risk Reduction Clinic Diplomate of the American Board of Clinical Lipidology Attending Cardiologist  Direct Dial: [phone]  Fax: [phone]  Website:  [email or web address] 05/28/2018, 1:43 PM

## 2018-05-28 NOTE — Anesthesia Postprocedure Evaluation (Signed)
Anesthesia Post Note  Patient: [name]  Procedure(s) Performed: CARDIOVERSION (N/A )     Patient location during evaluation: Endoscopy Anesthesia Type: General Level of consciousness: awake Pain management: pain level controlled Vital Signs Assessment: post-procedure vital signs reviewed and stable Respiratory status: spontaneous breathing Cardiovascular status: stable Postop Assessment: no apparent nausea or vomiting    Last Vitals:  Vitals:   05/28/18 1348 05/28/18 1400  BP: 117/84 126/67  Pulse: (!) 59 (!) 59  Resp: 18 13  Temp: 36.6 C   SpO2: 99% 99%    Last Pain:  Vitals:   05/28/18 1348  TempSrc: Oral  PainSc: 0-No pain                 Lashala Laser

## 2018-05-30 LAB — CUP PACEART INCLINIC DEVICE CHECK
Battery Remaining Longevity: 46 mo
Battery Voltage: 2.93 V
Brady Statistic AP VP Percent: 31.5 %
Brady Statistic AP VS Percent: 0.54 %
Brady Statistic AS VS Percent: 22.43 %
Brady Statistic RA Percent Paced: 26.27 %
Brady Statistic RV Percent Paced: 77.27 %
Date Time Interrogation Session: 20200214171348
HighPow Impedance: 77 Ohm
Implantable Lead Implant Date: 20170113
Implantable Lead Implant Date: 20170113
Implantable Lead Implant Date: 20170113
Implantable Lead Location: 753859
Implantable Lead Model: 4298
Implantable Pulse Generator Implant Date: 20170113
Lead Channel Impedance Value: 323 Ohm
Lead Channel Impedance Value: 380 Ohm
Lead Channel Impedance Value: 380 Ohm
Lead Channel Impedance Value: 437 Ohm
Lead Channel Impedance Value: 437 Ohm
Lead Channel Impedance Value: 494 Ohm
Lead Channel Impedance Value: 589 Ohm
Lead Channel Impedance Value: 589 Ohm
Lead Channel Impedance Value: 722 Ohm
Lead Channel Impedance Value: 722 Ohm
Lead Channel Impedance Value: 760 Ohm
Lead Channel Pacing Threshold Amplitude: 0.375 V
Lead Channel Pacing Threshold Amplitude: 0.5 V
Lead Channel Pacing Threshold Amplitude: 0.625 V
Lead Channel Pacing Threshold Pulse Width: 0.4 ms
Lead Channel Pacing Threshold Pulse Width: 0.4 ms
Lead Channel Sensing Intrinsic Amplitude: 0.25 mV
Lead Channel Sensing Intrinsic Amplitude: 1.5 mV
Lead Channel Sensing Intrinsic Amplitude: 8.375 mV
Lead Channel Sensing Intrinsic Amplitude: 9.25 mV
Lead Channel Setting Pacing Amplitude: 1.5 V
Lead Channel Setting Pacing Amplitude: 1.5 V
Lead Channel Setting Pacing Amplitude: 2 V
Lead Channel Setting Pacing Pulse Width: 0.4 ms
Lead Channel Setting Pacing Pulse Width: 0.4 ms
Lead Channel Setting Sensing Sensitivity: 0.3 mV
MDC IDC LEAD LOCATION: 753858
MDC IDC LEAD LOCATION: 753860
MDC IDC MSMT LEADCHNL LV IMPEDANCE VALUE: 380 Ohm
MDC IDC MSMT LEADCHNL RA IMPEDANCE VALUE: 437 Ohm
MDC IDC MSMT LEADCHNL RA PACING THRESHOLD PULSEWIDTH: 0.4 ms
MDC IDC STAT BRADY AS VP PERCENT: 45.54 %

## 2018-06-26 ENCOUNTER — Other Ambulatory Visit: Payer: Self-pay

## 2018-06-26 ENCOUNTER — Ambulatory Visit (INDEPENDENT_AMBULATORY_CARE_PROVIDER_SITE_OTHER)
Admission: RE | Admit: 2018-06-26 | Discharge: 2018-06-26 | Disposition: A | Discharge status: Home / Self Care | Payer: Medicare HMO | Source: Ambulatory Visit | Attending: Pulmonary Disease | Admitting: Pulmonary Disease

## 2018-06-26 DIAGNOSIS — R918 Other nonspecific abnormal finding of lung field: Secondary | ICD-10-CM

## 2018-06-27 DIAGNOSIS — N183 Chronic kidney disease, stage 3 (moderate): Secondary | ICD-10-CM | POA: Diagnosis not present

## 2018-06-27 DIAGNOSIS — K219 Gastro-esophageal reflux disease without esophagitis: Secondary | ICD-10-CM | POA: Diagnosis not present

## 2018-06-27 DIAGNOSIS — R1032 Left lower quadrant pain: Secondary | ICD-10-CM | POA: Diagnosis not present

## 2018-07-01 DIAGNOSIS — K5792 Diverticulitis of intestine, part unspecified, without perforation or abscess without bleeding: Secondary | ICD-10-CM | POA: Diagnosis not present

## 2018-07-07 ENCOUNTER — Other Ambulatory Visit: Payer: Self-pay | Admitting: Cardiology

## 2018-07-07 MED ORDER — MEXILETINE HCL 150 MG PO CAPS: 300.0000 mg | ORAL_CAPSULE | Freq: Three times a day (TID) | ORAL | 3 refills | Status: DC

## 2018-07-07 NOTE — Telephone Encounter (Signed)
Pt's medication was sent to pt's pharmacy as requested. Confirmation received.  °

## 2018-07-14 ENCOUNTER — Telehealth: Payer: Self-pay | Admitting: Pulmonary Disease

## 2018-07-14 NOTE — Telephone Encounter (Signed)
Call made to patient, requesting results of Chest CT.   TN please advise. Thanks.

## 2018-07-14 NOTE — Telephone Encounter (Signed)
Called patient's home and spoke with spouse. Made aware CT stable. Also made [facility] aware. Nothing further needed.

## 2018-07-14 NOTE — Telephone Encounter (Signed)
Patient's CT scan looks stable. Please call patient to let him know. Thanks.

## 2018-07-30 ENCOUNTER — Telehealth: Payer: Self-pay

## 2018-07-30 NOTE — Telephone Encounter (Signed)
Pt has given consent to have a phone visit with Dr. Radford Pax. Pt has been advised to have BP, HR, and weight ready for visit.    YOUR CARDIOLOGY TEAM HAS ARRANGED FOR AN E-VISIT FOR YOUR APPOINTMENT - PLEASE REVIEW IMPORTANT INFORMATION BELOW SEVERAL DAYS PRIOR TO YOUR APPOINTMENT  Due to the recent COVID-19 pandemic, we are transitioning in-person office visits to tele-medicine visits in an effort to decrease unnecessary exposure to our patients and staff. Medicare and most insurances are covering these visits without a copay needed. You will need a working email and a smartphone or computer with a camera and microphone. For patients that do not have these items, we can still complete the visit using a telephone but do prefer video when possible. If possible, we also ask that you have a blood pressure cuff and scale at home to measure your blood pressure, heart rate and weight prior to your scheduled appointment. Patients with clinical needs that need an in-person evaluation and testing will still be able to come to the office if absolutely necessary. If you have any questions, feel free to call our office.     DOWNLOADING THE SOFTWARE  Download the [employer] app to enable video and telephone visits with your [name] Provider.   Instructions for downloading [employer] WebEx: - Go to https://www.webex.com/downloads.html and follow the instructions, or download the app on your smartphone [facility] [employer] Meetings). - If you have technical difficulties with downloading WebEx, please call WebEx at [phone]. - Once the app is downloaded (can be done on either mobile or desktop computer), go to Settings in the upper left hand corner.  Be sure that camera and audio are enabled.  - You will receive an email message with a link to the meeting with a time to join for your tele-health visit.  - Please download the app and have settings configured prior to the appointment time.      2-3 DAYS BEFORE  YOUR APPOINTMENT  One of our staff will call you to confirm that you have been able to set up your WebEx account. We will remind you check your blood pressure, heart rate and weight prior to your scheduled appointment. If you have an Apple Watch or Kardia, please upload any pertinent ECG strips the day before or morning of your appointment to [place]. Our staff will also make sure you have reviewed the consent and agree to move forward with your scheduled tele-health visit.    THE DAY OF YOUR APPOINTMENT  Approximately 15-20 minutes prior to your scheduled appointment, you will receive an e-mail directly from one of our staff member's [email or web address] e-mail accounts inviting you to join a WebEx meeting.  Please do not reply to that email - simply join the [employer].  Upon joining, a member of the office staff will speak with you initially through the WebEx platform to confirm medications, vital signs for the day and any symptoms you may be experiencing.  Please have this information available prior to the time of visit start.      CONSENT FOR TELE-HEALTH VISIT - PLEASE RVIEW  I hereby voluntarily request, consent and authorize [facility] and its employed or contracted physicians, physician assistants, nurse practitioners or other licensed health care professionals (the Practitioner), to provide me with telemedicine health care services (the "Services") as deemed necessary by the treating Practitioner. I acknowledge and consent to receive the Services by the Practitioner via telemedicine. I understand that the telemedicine visit will involve communicating  with the Practitioner through live audiovisual communication technology and the disclosure of certain medical information by electronic transmission. I acknowledge that I have been given the opportunity to request an in-person assessment or other available alternative prior to the telemedicine visit and am voluntarily participating in the  telemedicine visit.  I understand that I have the right to withhold or withdraw my consent to the use of telemedicine in the course of my care at any time, without affecting my right to future care or treatment, and that the Practitioner or I may terminate the telemedicine visit at any time. I understand that I have the right to inspect all information obtained and/or recorded in the course of the telemedicine visit and may receive copies of available information for a reasonable fee.  I understand that some of the potential risks of receiving the Services via telemedicine include:  Marland Kitchen Delay or interruption in medical evaluation due to technological equipment failure or disruption; . Information transmitted may not be sufficient (e.g. poor resolution of images) to allow for appropriate medical decision making by the Practitioner; and/or  . In rare instances, security protocols could fail, causing a breach of personal health information.  Furthermore, I acknowledge that it is my responsibility to provide information about my medical history, conditions and care that is complete and accurate to the best of my ability. I acknowledge that Practitioner's advice, recommendations, and/or decision may be based on factors not within their control, such as incomplete or inaccurate data provided by me or distortions of diagnostic images or specimens that may result from electronic transmissions. I understand that the practice of medicine is not an exact science and that Practitioner makes no warranties or guarantees regarding treatment outcomes. I acknowledge that I will receive a copy of this consent concurrently upon execution via email to the email address I last provided but may also request a printed copy by calling the office of [place].    I understand that my insurance will be billed for this visit.   I have read or had this consent read to me. . I understand the contents of this consent, which  adequately explains the benefits and risks of the Services being provided via telemedicine.  . I have been provided ample opportunity to ask questions regarding this consent and the Services and have had my questions answered to my satisfaction. . I give my informed consent for the services to be provided through the use of telemedicine in my medical care  By participating in this telemedicine visit I agree to the above.

## 2018-08-01 ENCOUNTER — Other Ambulatory Visit: Payer: Self-pay

## 2018-08-01 ENCOUNTER — Encounter: Payer: Self-pay | Admitting: Cardiology

## 2018-08-01 ENCOUNTER — Telehealth (INDEPENDENT_AMBULATORY_CARE_PROVIDER_SITE_OTHER): Payer: Medicare HMO | Admitting: Cardiology

## 2018-08-01 VITALS — BP 126/63 | HR 83 | Temp 96.5°F | Ht 72.0 in | Wt 190.0 lb

## 2018-08-01 DIAGNOSIS — N183 Chronic kidney disease, stage 3 unspecified: Secondary | ICD-10-CM

## 2018-08-01 DIAGNOSIS — Z7189 Other specified counseling: Secondary | ICD-10-CM

## 2018-08-01 DIAGNOSIS — I2510 Atherosclerotic heart disease of native coronary artery without angina pectoris: Secondary | ICD-10-CM | POA: Diagnosis not present

## 2018-08-01 DIAGNOSIS — I420 Dilated cardiomyopathy: Secondary | ICD-10-CM

## 2018-08-01 DIAGNOSIS — I472 Ventricular tachycardia, unspecified: Secondary | ICD-10-CM

## 2018-08-01 DIAGNOSIS — E785 Hyperlipidemia, unspecified: Secondary | ICD-10-CM | POA: Diagnosis not present

## 2018-08-01 DIAGNOSIS — Z953 Presence of xenogenic heart valve: Secondary | ICD-10-CM

## 2018-08-01 DIAGNOSIS — I4819 Other persistent atrial fibrillation: Secondary | ICD-10-CM | POA: Diagnosis not present

## 2018-08-01 DIAGNOSIS — I10 Essential (primary) hypertension: Secondary | ICD-10-CM

## 2018-08-01 DIAGNOSIS — I5042 Chronic combined systolic (congestive) and diastolic (congestive) heart failure: Secondary | ICD-10-CM | POA: Diagnosis not present

## 2018-08-01 DIAGNOSIS — I130 Hypertensive heart and chronic kidney disease with heart failure and stage 1 through stage 4 chronic kidney disease, or unspecified chronic kidney disease: Secondary | ICD-10-CM | POA: Diagnosis not present

## 2018-08-01 DIAGNOSIS — Z95810 Presence of automatic (implantable) cardiac defibrillator: Secondary | ICD-10-CM

## 2018-08-01 NOTE — Patient Instructions (Signed)
Medication Instructions:  Your physician recommends that you continue on your current medications as directed. Please refer to the Current Medication list given to you today.  If you need a refill on your cardiac medications before your next appointment, please call your pharmacy.   Lab work: Fasting labs: Lipid, Liver and BMET, to be done in July  If you have labs (blood work) drawn today and your tests are completely normal, you will receive your results only by: Marland Kitchen MyChart Message (if you have MyChart) OR . A paper copy in the mail If you have any lab test that is abnormal or we need to change your treatment, we will call you to review the results.  Testing/Procedures: None  Follow-Up: At [facility], you and your health needs are our priority.  As part of our continuing mission to provide you with exceptional heart care, we have created designated Provider Care Teams.  These Care Teams include your primary Cardiologist (physician) and Advanced Practice Providers (APPs -  Physician Assistants and Nurse Practitioners) who all work together to provide you with the care you need, when you need it.  Schedule an appointment with Dr. Haroldine Laws, a message was sent to his nurse.   You will need a follow up appointment in 6 months.  Please call our office 2 months in advance to schedule this appointment.  You may see [name], MD or one of the following Advanced Practice Providers on your designated Care Team:   [place], PA-C Melina Copa, PA-C . Ermalinda Barrios, PA-C

## 2018-08-01 NOTE — Progress Notes (Signed)
Virtual Visit via Telephone Note   This visit type was conducted due to national recommendations for restrictions regarding the COVID-19 Pandemic (e.g. social distancing) in an effort to limit this patient's exposure and mitigate transmission in our community.  Due to his co-morbid illnesses, this patient is at least at moderate risk for complications without adequate follow up.  This format is felt to be most appropriate for this patient at this time.  All issues noted in this document were discussed and addressed.  A limited physical exam was performed with this format.  Please refer to the patient's chart for his consent to telehealth for [facility].  Evaluation Performed:  Follow-up visit  This visit type was conducted due to national recommendations for restrictions regarding the COVID-19 Pandemic (e.g. social distancing).  This format is felt to be most appropriate for this patient at this time.  All issues noted in this document were discussed and addressed.  No physical exam was performed (except for noted visual exam findings with Video Visits).  Please refer to the patient's chart (MyChart message for video visits and phone note for telephone visits) for the patient's consent to telehealth for [facility].  Date:  08/01/2018   ID:  [name], DOB 12-09-1932, MRN [id number]  Patient Location:  Home  Provider location:   [place]  PCP:  [name] Melter, MD  Cardiologist:  [name] Him, MD  Electrophysiologist:  [name] Haw, MD   Chief Complaint:  Atrial fibrillation , AS, CHF, DCM, CAD  History of Present Illness:    [name] is a 85 y.o. male who presents via audio/video conferencing for a telehealth visit today.    [name] is a 85 y.o. male with a hx of hypertension, aortic stenosis status post AVR, chronic combined systolic and diastolic CHF in the setting of A. fib with RVR, orthostatic hypotension, dilated cardiomyopathy EF 25-30%  (initially concerned for TTP amyloidosis but Tc-PYP scan 06/2017 was not consistent with amyloid and SPEP/UPEP with no M spike).  He is s/p CRT-D.    He has a h/o ASACAD s/p CABG, and paroxysmal atrial fibrillation. His atrial fibrillation has been cardioverted in the past and is on amiodarone for suppression. He had CRT-D upgrade on 04/22/15.  Cardiac cath in February 2019 showed moderate pulmonary hypertension consistent with a mixture of pulmonary venous and pulmonary arterial hypertension (WHO group 2 and 3) with normal cardiac output.  Cath also showed three-vessel CAD with patent LIMA to the LAD, SVG to diagonal and SVG to the RCA.  He was having VT below his detection limit. Device limits were changed on the last EP visit and he was put on mexiletine. He was hospitalized for systolic heart failure with aggressive diuresis and thoracentesis. He had a wide-complex tachycardia during hospitalization at a rate of approximately 110 bpm.  He was seen by EP in the end of July and had noted to have one episode of VT on 10/18/2017 resulting in ICD shock.  He was seen by EP on 02/04/2018 and had device interrogation of his Medtronic CRT-D which was functioning appropriately and did not appear volume overloaded.  He was maintained normal sinus rhythm.  He had not had any further episodes of ventricular arrhythmias.   He was seen back again on 05/23/2018 by [name] with EP and was back in atrial fibrillation and had wide-complex tachycardia that was appeared to be atrial fibrillation with ventricular pacing.  He underwent successful cardioversion to sinus  rhythm on 05/28/2018.  He is here today for followup and is doing well.  He denies any chest pain or pressure, SOB, DOE, PND, orthopnea, LE edema, dizziness, palpitations or syncope. He is compliant with his meds and is tolerating meds with no SE.    The patient does not have symptoms concerning for COVID-19 infection (fever, chills, cough, or new shortness  of breath).   Prior CV studies:   The following studies were reviewed today:  2D echo 05/2017  Past Medical History:  Diagnosis Date   Arthritis    "minor everywhere" (04/22/2015)   Asthma    "a touch"   Barrett esophagus    CAD (coronary artery disease)    a. s/p 2 vessel CABG with LIMA to LAD, SVG to diagonal 1, SVG to RCA 12/09.   Cardiac resynchronization therapy defibrillator (CRT-D) in place    Chronic anticoagulation 01/23/2013   Chronic combined systolic and diastolic CHF (congestive heart failure) (HCC)    dry weight 197-200lbs.   CKD (chronic kidney disease) stage 3, GFR 30-59 ml/min (HCC) 08/23/2014   Coronary artery disease 01/23/2013   Dyslipidemia 01/23/2013   Dyspnea    Essential hypertension 01/23/2013   GERD (gastroesophageal reflux disease)    GI bleed 05/08/2017   H/O: rheumatic fever    Hepatitis 1957   "don't know what kind:   History of hiatal hernia    History of PFTs    a. Amiodarone started 5/16 >> PFTs w/ DLCO 6/16:  FEV1 72% predicted, FEV1/FVC 66%, DLCO 66% >> minimal reversible obstructive airways disease with mild diffusion defect (suggestive of emphysema but absence of hyperinflation inconsistent with dx)   HTN (hypertension)    Hypercholesteremia    Hyperkalemia 08/23/2014   Hyperthyroidism 05/26/2017   Leg weakness 01/28/2017   Multiple thyroid nodules 08/13/2017   NICM (nonischemic cardiomyopathy) ([place])    initially concerned for TTP amyloidosis but Tc-PYP scan 06/2017 was not consistent with amyloid and SPEP/UPEP with no M spike. EF 20-25% by echo 2019   Orthostatic hypotension    Persistent atrial fibrillation 01/23/2013   Pleural effusion on right 05/26/2017   Pneumonia 08/2014   "dr thought I may have had a touch"   Pre-diabetes    Protein-calorie malnutrition, severe 05/27/2017   S/P AVR (aortic valve replacement) 2009   a. severe AS s/p AVR with pericardial tissue valve 2009.   Shingles 08/23/2014   VT  (ventricular tachycardia) ([place]) 05/06/2017   Past Surgical History:  Procedure Laterality Date   AORTIC VALVE REPLACEMENT  with 23-mm Magna Ease pericardial valve, model number   with 23-mm Magna Ease pericardial valve, model [id number], serial number [id number]    APPENDECTOMY  1940s   BI-VENTRICULAR IMPLANTABLE CARDIOVERTER DEFIBRILLATOR  (CRT-D)  04/22/2015   CARDIAC CATHETERIZATION N/A 11/04/2014   Procedure: Left Heart Cath and Cors/Grafts Angiography;  Surgeon: Leonie Man, MD;  Location: [place] [facility];  Service: Cardiovascular;  Laterality: N/A;   CARDIAC VALVE REPLACEMENT     CARDIOVERSION N/A 01/27/2013   Procedure: CARDIOVERSION;  Surgeon: Sueanne Margarita, MD;  Location: [place] [facility];  Service: Cardiovascular;  Laterality: N/A;   CARDIOVERSION N/A 08/25/2014   Procedure: CARDIOVERSION;  Surgeon: Sanda Klein, MD;  Location: [place] [facility];  Service: Cardiovascular;  Laterality: N/A;   CARDIOVERSION N/A 05/28/2018   Procedure: CARDIOVERSION;  Surgeon: Pixie Casino, MD;  Location: [place];  Service: Cardiovascular;  Laterality: N/A;   CATARACT EXTRACTION W/ INTRAOCULAR LENS  IMPLANT, BILATERAL Bilateral  CHEST TUBE INSERTION Right 07/26/2017   Procedure: INSERTION PLEURAL DRAINAGE CATHETER - RIGHT;  Surgeon: Ivin Poot, MD;  Location: [place];  Service: Thoracic;  Laterality: Right;   CORONARY ARTERY BYPASS GRAFT  03/10/2008   x 3 Dr. Roxan Hockey   EP IMPLANTABLE DEVICE N/A 04/22/2015   Procedure: BiV ICD Insertion CRT-D;  Surgeon: [name], MD;  Location: [place] [facility];  Service: Cardiovascular;  Laterality: N/A;   ESOPHAGOGASTRODUODENOSCOPY (EGD) WITH ESOPHAGEAL DILATION  X1   ESOPHAGOGASTRODUODENOSCOPY (EGD) WITH PROPOFOL N/A 05/09/2017   Procedure: ESOPHAGOGASTRODUODENOSCOPY (EGD) WITH PROPOFOL;  Surgeon: Carol Ada, MD;  Location: [place];  Service: Endoscopy;  Laterality: N/A;   IR THORACENTESIS ASP PLEURAL SPACE W/IMG  GUIDE  05/27/2017   IR THORACENTESIS ASP PLEURAL SPACE W/IMG GUIDE  06/17/2017   IR THORACENTESIS ASP PLEURAL SPACE W/IMG GUIDE  06/18/2017   IR THORACENTESIS ASP PLEURAL SPACE W/IMG GUIDE  07/11/2017   REMOVAL OF PLEURAL DRAINAGE CATHETER Right 09/13/2017   Procedure: REMOVAL OF PLEURAL DRAINAGE CATHETER;  Surgeon: Ivin Poot, MD;  Location: [place];  Service: Thoracic;  Laterality: Right;   RIGHT HEART CATH AND CORONARY/GRAFT ANGIOGRAPHY N/A 05/31/2017   Procedure: RIGHT HEART CATH AND CORONARY/GRAFT ANGIOGRAPHY;  Surgeon: Jolaine Artist, MD;  Location: [place] [facility];  Service: Cardiovascular;  Laterality: N/A;   TONSILLECTOMY       Current Meds  Medication Sig   albuterol (PROVENTIL HFA;VENTOLIN HFA) 108 (90 Base) MCG/ACT inhaler Inhale 1-2 puffs into the lungs every 6 (six) hours as needed for wheezing.    atorvastatin (LIPITOR) 20 MG tablet TAKE 1 TABLET (20 MG TOTAL) DAILY AT 6 PM.   carvedilol (COREG) 25 MG tablet Take 1 tablet (25 mg total) by mouth 2 (two) times daily.   ELIQUIS 2.5 MG TABS tablet TAKE 1 TABLET TWICE DAILY   famotidine (PEPCID) 40 MG tablet Take 40 mg by mouth daily.   furosemide (LASIX) 40 MG tablet Take 1 tablet (40 mg total) by mouth daily.   hydrALAZINE (APRESOLINE) 25 MG tablet TAKE 1/2 TABLET (12.5MG) BY MOUTH 2 (TWO) TIMES DAILY BEFORE MEALS (Patient taking differently: Take 12.5 mg by mouth 2 (two) times daily. )   isosorbide mononitrate (IMDUR) 30 MG 24 hr tablet TAKE 1 TABLET (30 MG TOTAL) BY MOUTH DAILY. (Patient taking differently: Take 30 mg by mouth daily. )   mexiletine (MEXITIL) 150 MG capsule Take 2 capsules (300 mg total) by mouth 3 (three) times daily.   Multiple Vitamin (MULTIVITAMIN WITH MINERALS) TABS tablet Take 1 tablet by mouth daily.    Omega-3 Fatty Acids (FISH OIL) 1000 MG CAPS Take 2,000 mg by mouth 2 (two) times daily.   pantoprazole (PROTONIX) 40 MG tablet Take 1 tablet by mouth daily.   potassium chloride SA  (K-DUR,KLOR-CON) 20 MEQ tablet Take 1 tablet (20 mEq total) by mouth daily. (Patient taking differently: Take 20 mEq by mouth every other day. )   traMADol (ULTRAM) 50 MG tablet Take 1 tablet (50 mg total) by mouth every 12 (twelve) hours as needed for moderate pain or severe pain.     Allergies:   Novocain [procaine] and Amiodarone   Social History   Tobacco Use   Smoking status: Never Smoker   Smokeless tobacco: Never Used  Substance Use Topics   Alcohol use: Not Currently    Comment: 04/22/2015 "nothing since the 1980s; never had a problem w/it"   Drug use: No     Family Hx: The  patient's family history includes Alzheimer's disease in his mother and another family member; COPD in his daughter and sister; Depression in his sister; Esophageal cancer in his sister; Heart disease in his sister; Hyperlipidemia in his sister; Hypertension in his sister.  ROS:   Please see the history of present illness.     All other systems reviewed and are negative.   Labs/Other Tests and Data Reviewed:    Recent Labs: 10/18/2017: Magnesium 2.5 12/17/2017: TSH 3.71 05/23/2018: BUN 27; Creatinine, Ser 2.06; Hemoglobin 15.0; Platelets 325; Potassium 5.3; Sodium 138   Recent Lipid Panel Lab Results  Component Value Date/Time   CHOL 126 07/30/2016 11:43 AM   CHOL 93 (L) 06/16/2014 09:14 AM   TRIG 154 (H) 07/30/2016 11:43 AM   TRIG 124 06/16/2014 09:14 AM   HDL 45 07/30/2016 11:43 AM   HDL 34 (L) 06/16/2014 09:14 AM   CHOLHDL 2.8 07/30/2016 11:43 AM   CHOLHDL 2.7 01/19/2015 10:05 AM   LDLCALC 50 07/30/2016 11:43 AM   LDLCALC 34 06/16/2014 09:14 AM    Wt Readings from Last 3 Encounters:  08/01/18 190 lb (86.2 kg)  05/28/18 189 lb (85.7 kg)  05/23/18 194 lb (88 kg)     Objective:    Vital Signs:  BP 126/63    Pulse 83    Temp (!) 96.5 F (35.8 C)    Ht 6' (1.829 m)    Wt 190 lb (86.2 kg)    SpO2 98%    BMI 25.77 kg/m      ASSESSMENT & PLAN:    1.  Chronic combined  systolic/diastolic CHF  - he does not think that he is volume overloaded.  He has no LE edema and no SOB.  His weight is stable (dry weight usually around 197lbs and he is 190lbs today).  He will continue on BB, Hydralazine, nitrates and Lasix 31m daily.  His last creatinine was 2.11 on /20/2020.  Given that his creatinine has bumped and his weight is down I have recommended that he decrease Lasix to 486mqod alternating with 2087mod. He will let me know if his weight increases more than 3lbs in a day or 5lbs in a week. He has not followed up with AHF clinic so I will get [name] back into see Dr. BenHaroldine Laws 2.  ASCAD - s/p remote CABG.  Cath 05/2017 showed three-vessel CAD with patent LIMA to the LAD, SVG to diagonal and SVG to the RCA.  He has not had any anginal sx since I saw [name] last.  He will continue on Carvedilol 31m38mD, Imdur 30mg59mly and statin.  He is not on ASA due to Eliquis.   3.  HTN - his BP is well controlled today.  He will continue on carvedilol 31mg 69m Hydralazine 12.5mg BI72mImdur 30mg da71m  4.  DCM - his last echo 05/2017 showed severe LV dysfunction with EF 20-25% with focal wall motion abnormalities c/w ischemic cardiomyopathy.  He is s/p ICD with CRT-D upgrade in 2017    5.  Ventricular Tachycardia -  S/p ICD with CRT-D upgrade in 2017 - followed in device clinic and EP.  He has not had any shocks but had ATP earlier due to afib.  He will continue on Mexiletine 300mg TID56m BB.  6.  Hyperlipidemia - his LDL goal is < 70.  His last LDL was 49 in 10/2017.  I will repeat an FLP and ALT in July once the COVID 19  crisis has improved.  He will continue on atorvastatin 70m daily.    7.  Paroxysmal atrial fibrillation - s/p DCCV 05/2018 and he thinks he is still in NSR.  He has not had any bleeding issues.  He will continue on Eliquis 2.578mBID (dosed for age>80 and Creatinine > 1.5).  Continue on BB.  8.  CKD stage 3 - this is followed by his PCP.  His last creatinine was 2.11  in March of this year. His baseline seems to be around 1.75-2 over the past year. He may be on the volume depleted side given increased creatinine in Feb and weight is down.  Decrease Lasix to 402mod alternating with 2m46md.  Repeat BMET at the time of lipids in July.   9.  Severe AS - he is s/p 23mm35mna Ease pericardial tissue valve in 2009.  Echo 05/2017 showed stable AV bioprosthesis with mean AVG 10mmH85md AVA 1.5cm2.   10.  COVID-19 Education: he signs and symptoms of COVID-19 were discussed with the patient and how to seek care for testing (follow up with PCP or arrange E-visit).  The importance of social distancing was discussed today.  Patient Risk:   After full review of this patient's clinical status, I feel that they are at least moderate risk at this time.  Time:   Today, I have spent 15 minutes directly with the patient on  discussing medical problems including discussing labs and worsening renal function, CHF, HTN and lipids..  We also reviewed the symptoms of COVID 19 and the ways to protect against contracting the virus with telehealth technology.  I spent an additional [id number] patient's chart including review of recent labs, 2D echo and prior office visits as well as DCCV note.  Medication Adjustments/Labs and Tests Ordered: Current medicines are reviewed at length with the patient today.  Concerns regarding medicines are outlined above.  Tests Ordered: No orders of the defined types were placed in this encounter.  Medication Changes: No orders of the defined types were placed in this encounter.   Disposition:  Follow up in 6 month(s)  Signed, Terre Hanneman TFransico [name]/24/2020 1:12 PM    [facility]

## 2018-08-05 ENCOUNTER — Encounter: Payer: Self-pay | Admitting: Cardiology

## 2018-08-06 ENCOUNTER — Telehealth: Payer: Self-pay

## 2018-08-06 MED ORDER — FUROSEMIDE 40 MG PO TABS: ORAL_TABLET | ORAL | 3 refills | Status: DC

## 2018-08-06 NOTE — Telephone Encounter (Signed)
-----  Message from Sueanne Margarita, MD sent at 08/05/2018  7:09 PM EDT ----- Regarding: followup Please have patient decrease Lasix to 2m qod alternating with 224mqod.  I would like him to weigh daily and call if weight increases more than 3lbs in 1 day or 5lbs in 1 week.

## 2018-08-06 NOTE — Telephone Encounter (Signed)
Spoke with the patient, he expressed understanding about his decrease in lasix dose. He also will call the office if his weight fluctuates.

## 2018-08-19 ENCOUNTER — Other Ambulatory Visit: Payer: Self-pay

## 2018-08-19 ENCOUNTER — Ambulatory Visit (INDEPENDENT_AMBULATORY_CARE_PROVIDER_SITE_OTHER): Payer: Medicare HMO | Admitting: *Deleted

## 2018-08-19 DIAGNOSIS — I472 Ventricular tachycardia, unspecified: Secondary | ICD-10-CM

## 2018-08-19 DIAGNOSIS — I420 Dilated cardiomyopathy: Secondary | ICD-10-CM | POA: Diagnosis not present

## 2018-08-20 LAB — CUP PACEART REMOTE DEVICE CHECK
Battery Remaining Longevity: 42 mo
Battery Voltage: 2.97 V
Brady Statistic AP VP Percent: 82.53 %
Brady Statistic AP VS Percent: 0.32 %
Brady Statistic AS VP Percent: 16.95 %
Brady Statistic AS VS Percent: 0.2 %
Brady Statistic RA Percent Paced: 81.68 %
Brady Statistic RV Percent Paced: 98.47 %
Date Time Interrogation Session: 20200512103425
HighPow Impedance: 59 Ohm
Implantable Lead Implant Date: 20170113
Implantable Lead Implant Date: 20170113
Implantable Lead Implant Date: 20170113
Implantable Lead Location: 753858
Implantable Lead Location: 753859
Implantable Lead Location: 753860
Implantable Lead Model: 4298
Implantable Lead Model: 5076
Implantable Pulse Generator Implant Date: 20170113
Lead Channel Impedance Value: 323 Ohm
Lead Channel Impedance Value: 342 Ohm
Lead Channel Impedance Value: 380 Ohm
Lead Channel Impedance Value: 380 Ohm
Lead Channel Impedance Value: 399 Ohm
Lead Channel Impedance Value: 399 Ohm
Lead Channel Impedance Value: 437 Ohm
Lead Channel Impedance Value: 456 Ohm
Lead Channel Impedance Value: 646 Ohm
Lead Channel Impedance Value: 646 Ohm
Lead Channel Impedance Value: 722 Ohm
Lead Channel Impedance Value: 722 Ohm
Lead Channel Impedance Value: 760 Ohm
Lead Channel Pacing Threshold Amplitude: 0.375 V
Lead Channel Pacing Threshold Amplitude: 0.5 V
Lead Channel Pacing Threshold Amplitude: 0.75 V
Lead Channel Pacing Threshold Pulse Width: 0.4 ms
Lead Channel Pacing Threshold Pulse Width: 0.4 ms
Lead Channel Pacing Threshold Pulse Width: 0.4 ms
Lead Channel Sensing Intrinsic Amplitude: 2.625 mV
Lead Channel Sensing Intrinsic Amplitude: 2.625 mV
Lead Channel Sensing Intrinsic Amplitude: 7.5 mV
Lead Channel Sensing Intrinsic Amplitude: 7.5 mV
Lead Channel Setting Pacing Amplitude: 1.5 V
Lead Channel Setting Pacing Amplitude: 1.5 V
Lead Channel Setting Pacing Amplitude: 2 V
Lead Channel Setting Pacing Pulse Width: 0.4 ms
Lead Channel Setting Pacing Pulse Width: 0.4 ms
Lead Channel Setting Sensing Sensitivity: 0.3 mV

## 2018-08-21 ENCOUNTER — Telehealth: Payer: Self-pay | Admitting: *Deleted

## 2018-08-21 NOTE — Telephone Encounter (Signed)
Calling patient today to discuss upcoming appointment.  We are currently trying to limit exposure to the virus that causes COVID-19 by seeing patients at home rather than in the office. We would like to schedule this appointment as a Psychologist, counselling. Unable to reach patient.  Phone continuously rings.

## 2018-08-25 ENCOUNTER — Other Ambulatory Visit: Payer: Self-pay | Admitting: Cardiology

## 2018-08-25 NOTE — Telephone Encounter (Signed)

## 2018-08-26 ENCOUNTER — Other Ambulatory Visit: Payer: Self-pay

## 2018-08-26 ENCOUNTER — Telehealth (INDEPENDENT_AMBULATORY_CARE_PROVIDER_SITE_OTHER): Payer: Medicare HMO | Admitting: Cardiology

## 2018-08-26 ENCOUNTER — Encounter: Payer: Self-pay | Admitting: Cardiology

## 2018-08-26 DIAGNOSIS — Z95810 Presence of automatic (implantable) cardiac defibrillator: Secondary | ICD-10-CM

## 2018-08-26 NOTE — Progress Notes (Signed)
Electrophysiology TeleHealth Note   Due to national recommendations of social distancing due to COVID 19, an audio/video telehealth visit is felt to be most appropriate for this patient at this time.  See Epic message for the patient's consent to telehealth for [facility].   Date:  08/26/2018   ID:  [name], DOB 02-17-1933, MRN [id number]  Location: patient's home  Provider location: [address], [place]  Evaluation Performed: Follow-up visit  PCP:  [name], MD  Cardiologist:  [name], MD  Electrophysiologist:  Dr Curt Bears  Chief Complaint:  CHF, AF  History of Present Illness:    [name] is a 85 y.o. male who presents via audio/video conferencing for a telehealth visit today.  Since last being seen in our clinic, the patient reports doing very well.  Today, he denies symptoms of palpitations, chest pain, shortness of breath,  lower extremity edema, dizziness, presyncope, or syncope.  The patient is otherwise without complaint today.  The patient denies symptoms of fevers, chills, cough, or new SOB worrisome for COVID 19.   Today, denies symptoms of palpitations, chest pain, shortness of breath, orthopnea, PND, lower extremity edema, claudication, dizziness, presyncope, syncope, bleeding, or neurologic sequela. The patient is tolerating medications without difficulties.  Overall he is doing well.  He has noted no discharges from his defibrillator.  He does say that in the mornings he has about 1 minutes of palpitations.  Review of device interrogation shows no major rhythm abnormality.  Otherwise he is able to do all his daily activities without restriction.  Past Medical History:  Diagnosis Date  . Arthritis    "minor everywhere" (04/22/2015)  . Asthma    "a touch"  . Barrett esophagus   . CAD (coronary artery disease)    a. s/p 3 vvessel CABG with LIMA to LAD, SVG to diagonal 1, SVG to RCA 12/09.  . Cardiac resynchronization  therapy defibrillator (CRT-D) in place   . Chronic anticoagulation 01/23/2013  . Chronic combined systolic and diastolic CHF (congestive heart failure) (HCC)    dry weight 197-200lbs.  . CKD (chronic kidney disease) stage 3, GFR 30-59 ml/min (HCC) 08/23/2014  . DCM (dilated cardiomyopathy) ([place])    initially concerned for TTP amyloidosis but Tc-PYP scan 06/2017 was not consistent with amyloid and SPEP/UPEP with no M spike. EF 20-25% by echo 2019  . Dyslipidemia 01/23/2013  . Essential hypertension 01/23/2013  . GERD (gastroesophageal reflux disease)   . GI bleed 05/08/2017  . H/O: rheumatic fever   . Hepatitis 1957   "don't know what kind:  . History of hiatal hernia   . History of PFTs    a. Amiodarone started 5/16 >> PFTs w/ DLCO 6/16:  FEV1 72% predicted, FEV1/FVC 66%, DLCO 66% >> minimal reversible obstructive airways disease with mild diffusion defect (suggestive of emphysema but absence of hyperinflation inconsistent with dx)  . Hyperkalemia 08/23/2014  . Hyperthyroidism 05/26/2017  . Leg weakness 01/28/2017  . Multiple thyroid nodules 08/13/2017  . Orthostatic hypotension   . Persistent atrial fibrillation 01/23/2013  . Pleural effusion on right 05/26/2017  . Pneumonia 08/2014   "dr thought I may have had a touch"  . Pre-diabetes   . Protein-calorie malnutrition, severe 05/27/2017  . S/P AVR (aortic valve replacement) 2009   a. severe AS s/p AVR with pericardial tissue valve 2009.  Marland Kitchen Shingles 08/23/2014  . VT (ventricular tachycardia) ([place]) 05/06/2017    Past Surgical History:  Procedure Laterality  Date  . AORTIC VALVE REPLACEMENT  with 23-mm Magna Ease pericardial valve, model number   with 23-mm Magna Ease pericardial valve, model [id number], serial number [id number]   . APPENDECTOMY  1940s  . BI-VENTRICULAR IMPLANTABLE CARDIOVERTER DEFIBRILLATOR  (CRT-D)  04/22/2015  . CARDIAC CATHETERIZATION N/A 11/04/2014   Procedure: Left Heart Cath and Cors/Grafts Angiography;  Surgeon: Leonie Man, MD;  Location: [facility];  Service: Cardiovascular;  Laterality: N/A;  . CARDIAC VALVE REPLACEMENT    . CARDIOVERSION N/A 01/27/2013   Procedure: CARDIOVERSION;  Surgeon: Sueanne Margarita, MD;  Location: [facility];  Service: Cardiovascular;  Laterality: N/A;  . CARDIOVERSION N/A 08/25/2014   Procedure: CARDIOVERSION;  Surgeon: Sanda Klein, MD;  Location: [facility] [place] [facility] [place] [facility];  Service: Cardiovascular;  Laterality: N/A;  . CARDIOVERSION N/A 05/28/2018   Procedure: CARDIOVERSION;  Surgeon: Pixie Casino, MD;  Location: [facility];  Service: Cardiovascular;  Laterality: N/A;  . CATARACT EXTRACTION W/ INTRAOCULAR LENS  IMPLANT, BILATERAL Bilateral   . CHEST TUBE INSERTION Right 07/26/2017   Procedure: INSERTION PLEURAL DRAINAGE CATHETER - RIGHT;  Surgeon: Ivin Poot, MD;  Location: [facility];  Service: Thoracic;  Laterality: Right;  . CORONARY ARTERY BYPASS GRAFT  03/10/2008   x 3 Dr. Roxan Hockey  . EP IMPLANTABLE DEVICE N/A 04/22/2015   Procedure: BiV ICD Insertion CRT-D;  Surgeon: Earnie Rockhold Meredith Leeds, MD;  Location: [facility];  Service: Cardiovascular;  Laterality: N/A;  . ESOPHAGOGASTRODUODENOSCOPY (EGD) WITH ESOPHAGEAL DILATION  X1  . ESOPHAGOGASTRODUODENOSCOPY (EGD) WITH PROPOFOL N/A 05/09/2017   Procedure: ESOPHAGOGASTRODUODENOSCOPY (EGD) WITH PROPOFOL;  Surgeon: Carol Ada, MD;  Location: [facility];  Service: Endoscopy;  Laterality: N/A;  . IR THORACENTESIS ASP PLEURAL SPACE W/IMG GUIDE  05/27/2017  . IR THORACENTESIS ASP PLEURAL SPACE W/IMG GUIDE  06/17/2017  . IR THORACENTESIS ASP PLEURAL SPACE W/IMG GUIDE  06/18/2017  . IR THORACENTESIS ASP PLEURAL SPACE W/IMG GUIDE  07/11/2017  . REMOVAL OF PLEURAL DRAINAGE CATHETER Right 09/13/2017   Procedure: REMOVAL OF PLEURAL DRAINAGE CATHETER;  Surgeon: Ivin Poot, MD;  Location: [facility];  Service: Thoracic;  Laterality: Right;  . RIGHT HEART CATH AND CORONARY/GRAFT ANGIOGRAPHY N/A 05/31/2017   Procedure: RIGHT HEART  CATH AND CORONARY/GRAFT ANGIOGRAPHY;  Surgeon: Jolaine Artist, MD;  Location: [facility];  Service: Cardiovascular;  Laterality: N/A;  . TONSILLECTOMY      Current Outpatient Medications  Medication Sig Dispense Refill  . albuterol (PROVENTIL HFA;VENTOLIN HFA) 108 (90 Base) MCG/ACT inhaler Inhale 1-2 puffs into the lungs every 6 (six) hours as needed for wheezing.     Marland Kitchen atorvastatin (LIPITOR) 20 MG tablet TAKE 1 TABLET (20 MG TOTAL) DAILY AT 6 PM. 90 tablet 3  . ELIQUIS 2.5 MG TABS tablet TAKE 1 TABLET TWICE DAILY 180 tablet 2  . famotidine (PEPCID) 40 MG tablet Take 40 mg by mouth daily.    . furosemide (LASIX) 40 MG tablet Take 1 tablet, 40 mg, by mouth every other day, alternating with 1/2 tablet, 20 mg. 75 tablet 3  . hydrALAZINE (APRESOLINE) 25 MG tablet TAKE 1/2 TABLET (12.5MG) BY MOUTH 2 (TWO) TIMES DAILY BEFORE MEALS (Patient taking differently: Take 12.5 mg by mouth 2 (two) times daily. ) 90 tablet 3  . isosorbide mononitrate (IMDUR) 30 MG 24 hr tablet TAKE 1 TABLET (30 MG TOTAL) BY MOUTH DAILY. (Patient taking differently: Take 30 mg by mouth daily. ) 90 tablet 3  . mexiletine (MEXITIL) 150 MG capsule Take 2 capsules (  300 mg total) by mouth 3 (three) times daily. 540 capsule 3  . Multiple Vitamin (MULTIVITAMIN WITH MINERALS) TABS tablet Take 1 tablet by mouth daily.     . Omega-3 Fatty Acids (FISH OIL) 1000 MG CAPS Take 2,000 mg by mouth 2 (two) times daily.    . pantoprazole (PROTONIX) 40 MG tablet Take 1 tablet by mouth daily.    . potassium chloride SA (K-DUR,KLOR-CON) 20 MEQ tablet Take 1 tablet (20 mEq total) by mouth daily. (Patient taking differently: Take 20 mEq by mouth every other day. ) 90 tablet 3  . traMADol (ULTRAM) 50 MG tablet Take 1 tablet (50 mg total) by mouth every 12 (twelve) hours as needed for moderate pain or severe pain. 20 tablet 0  . carvedilol (COREG) 25 MG tablet Take 1 tablet (25 mg total) by mouth 2 (two) times daily. 180 tablet 3   No  current facility-administered medications for this visit.     Allergies:   Novocain [procaine] and Amiodarone   Social History:  The patient  reports that he has never smoked. He has never used smokeless tobacco. He reports previous alcohol use. He reports that he does not use drugs.   Family History:  The patient's  family history includes Alzheimer's disease in his mother and another family member; COPD in his daughter and sister; Depression in his sister; Esophageal cancer in his sister; Heart disease in his sister; Hyperlipidemia in his sister; Hypertension in his sister.   ROS:  Please see the history of present illness.   All other systems are personally reviewed and negative.    Exam:    Vital Signs:  BP 132/77   Pulse 68   Over the phone, no acute distress, no shortness of breath.  Labs/Other Tests and Data Reviewed:    Recent Labs: 10/18/2017: Magnesium 2.5 12/17/2017: TSH 3.71 05/23/2018: BUN 27; Creatinine, Ser 2.06; Hemoglobin 15.0; Platelets 325; Potassium 5.3; Sodium 138   Wt Readings from Last 3 Encounters:  08/01/18 190 lb (86.2 kg)  05/28/18 189 lb (85.7 kg)  05/23/18 194 lb (88 kg)     Other studies personally reviewed: Additional studies/ records that were reviewed today include: ECG 05/23/2018 personally reviewed AF with V pacing    Last device remote is reviewed from [facility] PDF dated 08/20/18 which reveals normal device function, no arrhythmias    ASSESSMENT & PLAN:    1.  Chronic systolic heart failure due to ischemic cardiomyopathy: Status post Medtronic CRT-D implanted 2017.  Currently on optimal medical therapy.  On mexiletine due to ventricular arrhythmias.  2.  Persistent atrial fibrillation: Currently on Eliquis.  He had a recent cardioversion and has stayed in sinus rhythm.  This patients CHA2DS2-VASc Score and unadjusted Ischemic Stroke Rate (% per year) is equal to 7.2 % stroke rate/year from a score of 5  Above score calculated as 1 point  each if present [CHF, HTN, DM, Vascular=MI/PAD/Aortic Plaque, Age if 65-74, or Male] Above score calculated as 2 points each if present [Age > 75, or Stroke/TIA/TE]  3.  Ventricular tachycardia: No further episodes of ATP.  Currently on mexiletine.  4.  Coronary artery disease status post CABG: no current chest pain   COVID 19 screen The patient denies symptoms of COVID 19 at this time.  The importance of social distancing was discussed today.  Follow-up:  6 months  Current medicines are reviewed at length with the patient today.   The patient does not have concerns regarding his medicines.  The following changes were made today:  none  Labs/ tests ordered today include:  No orders of the defined types were placed in this encounter.    Patient Risk:  after full review of this patients clinical status, I feel that they are at moderate risk at this time.  Today, I have spent 12 minutes with the patient with telehealth technology discussing AF, VT .    Signed, Amoria Mclees Meredith Leeds, MD  08/26/2018 2:27 PM     [place] [address] [place] [address] [phone] (office) [phone] (fax)

## 2018-08-26 NOTE — Patient Instructions (Signed)
Medication Instructions:  Your physician recommends that you continue on your current medications as directed. Please refer to the Current Medication list given to you today.  If you need a refill on your cardiac medications before your next appointment, please call your pharmacy.   Lab work: None ordered   Testing/Procedures: None ordered  Follow-Up:Your physician wants you to follow-up in: 6 months.  You will receive a reminder letter in the mail two months in advance. If you don't receive a letter, please call our office to schedule the follow-up appointment.  At [facility], you and your health needs are our priority.  As part of our continuing mission to provide you with exceptional heart care, we have created designated Provider Care Teams.  These Care Teams include your primary Cardiologist (physician) and Advanced Practice Providers (APPs -  Physician Assistants and Nurse Practitioners) who all work together to provide you with the care you need, when you need it. You will need a follow up appointment in 6 months.  Please call our office 2 months in advance to schedule this appointment.  You may see [name], MD or one of the following Advanced Practice Providers on your designated Care Team:   Chanetta Marshall, NP . Tommye Standard, PA-C

## 2018-08-28 NOTE — Telephone Encounter (Signed)
85 years old Last OV 08/26/2018 Scr 2.06 on 05/23/2018 Weight 86kg eliquis 2.93m BID sent to pharmacy

## 2018-09-04 NOTE — Progress Notes (Signed)
Remote ICD transmission.   

## 2018-09-19 DIAGNOSIS — K5792 Diverticulitis of intestine, part unspecified, without perforation or abscess without bleeding: Secondary | ICD-10-CM | POA: Diagnosis not present

## 2018-10-06 DIAGNOSIS — K5792 Diverticulitis of intestine, part unspecified, without perforation or abscess without bleeding: Secondary | ICD-10-CM | POA: Diagnosis not present

## 2018-10-16 DIAGNOSIS — K2270 Barrett's esophagus without dysplasia: Secondary | ICD-10-CM | POA: Diagnosis not present

## 2018-10-16 DIAGNOSIS — I255 Ischemic cardiomyopathy: Secondary | ICD-10-CM | POA: Diagnosis not present

## 2018-10-16 DIAGNOSIS — I2510 Atherosclerotic heart disease of native coronary artery without angina pectoris: Secondary | ICD-10-CM | POA: Diagnosis not present

## 2018-10-16 DIAGNOSIS — N184 Chronic kidney disease, stage 4 (severe): Secondary | ICD-10-CM | POA: Diagnosis not present

## 2018-10-16 DIAGNOSIS — I4891 Unspecified atrial fibrillation: Secondary | ICD-10-CM | POA: Diagnosis not present

## 2018-10-16 DIAGNOSIS — I509 Heart failure, unspecified: Secondary | ICD-10-CM | POA: Diagnosis not present

## 2018-10-16 DIAGNOSIS — E7800 Pure hypercholesterolemia, unspecified: Secondary | ICD-10-CM | POA: Diagnosis not present

## 2018-10-16 DIAGNOSIS — E1121 Type 2 diabetes mellitus with diabetic nephropathy: Secondary | ICD-10-CM | POA: Diagnosis not present

## 2018-10-16 DIAGNOSIS — I10 Essential (primary) hypertension: Secondary | ICD-10-CM | POA: Diagnosis not present

## 2018-10-29 ENCOUNTER — Other Ambulatory Visit (HOSPITAL_BASED_OUTPATIENT_CLINIC_OR_DEPARTMENT_OTHER): Payer: Self-pay | Admitting: Physician Assistant

## 2018-10-29 ENCOUNTER — Ambulatory Visit (HOSPITAL_BASED_OUTPATIENT_CLINIC_OR_DEPARTMENT_OTHER)
Admission: RE | Admit: 2018-10-29 | Discharge: 2018-10-29 | Disposition: A | Discharge status: Home / Self Care | Payer: Medicare HMO | Source: Ambulatory Visit | Attending: Physician Assistant | Admitting: Physician Assistant

## 2018-10-29 ENCOUNTER — Other Ambulatory Visit: Payer: Self-pay

## 2018-10-29 DIAGNOSIS — M79602 Pain in left arm: Secondary | ICD-10-CM

## 2018-10-29 DIAGNOSIS — M79622 Pain in left upper arm: Secondary | ICD-10-CM | POA: Diagnosis not present

## 2018-11-14 ENCOUNTER — Ambulatory Visit (HOSPITAL_COMMUNITY)
Admission: RE | Admit: 2018-11-14 | Discharge: 2018-11-14 | Disposition: A | Discharge status: Home / Self Care | Payer: Medicare HMO | Source: Ambulatory Visit | Attending: Internal Medicine | Admitting: Internal Medicine

## 2018-11-14 ENCOUNTER — Encounter (HOSPITAL_COMMUNITY): Payer: Self-pay | Admitting: Internal Medicine

## 2018-11-14 ENCOUNTER — Other Ambulatory Visit: Payer: Self-pay

## 2018-11-14 VITALS — BP 130/86 | HR 91 | Wt 199.2 lb

## 2018-11-14 DIAGNOSIS — E785 Hyperlipidemia, unspecified: Secondary | ICD-10-CM | POA: Insufficient documentation

## 2018-11-14 DIAGNOSIS — Z888 Allergy status to other drugs, medicaments and biological substances status: Secondary | ICD-10-CM | POA: Diagnosis not present

## 2018-11-14 DIAGNOSIS — Z95810 Presence of automatic (implantable) cardiac defibrillator: Secondary | ICD-10-CM | POA: Diagnosis not present

## 2018-11-14 DIAGNOSIS — I5042 Chronic combined systolic (congestive) and diastolic (congestive) heart failure: Secondary | ICD-10-CM | POA: Diagnosis not present

## 2018-11-14 DIAGNOSIS — Z800 Family history of malignant neoplasm of digestive organs: Secondary | ICD-10-CM | POA: Diagnosis not present

## 2018-11-14 DIAGNOSIS — Z79899 Other long term (current) drug therapy: Secondary | ICD-10-CM | POA: Insufficient documentation

## 2018-11-14 DIAGNOSIS — I130 Hypertensive heart and chronic kidney disease with heart failure and stage 1 through stage 4 chronic kidney disease, or unspecified chronic kidney disease: Secondary | ICD-10-CM | POA: Insufficient documentation

## 2018-11-14 DIAGNOSIS — K219 Gastro-esophageal reflux disease without esophagitis: Secondary | ICD-10-CM | POA: Diagnosis not present

## 2018-11-14 DIAGNOSIS — Z952 Presence of prosthetic heart valve: Secondary | ICD-10-CM | POA: Diagnosis not present

## 2018-11-14 DIAGNOSIS — J45909 Unspecified asthma, uncomplicated: Secondary | ICD-10-CM | POA: Insufficient documentation

## 2018-11-14 DIAGNOSIS — Z884 Allergy status to anesthetic agent status: Secondary | ICD-10-CM | POA: Diagnosis not present

## 2018-11-14 DIAGNOSIS — I472 Ventricular tachycardia: Secondary | ICD-10-CM | POA: Insufficient documentation

## 2018-11-14 DIAGNOSIS — R7303 Prediabetes: Secondary | ICD-10-CM | POA: Diagnosis not present

## 2018-11-14 DIAGNOSIS — Z8249 Family history of ischemic heart disease and other diseases of the circulatory system: Secondary | ICD-10-CM | POA: Diagnosis not present

## 2018-11-14 DIAGNOSIS — Z951 Presence of aortocoronary bypass graft: Secondary | ICD-10-CM | POA: Insufficient documentation

## 2018-11-14 DIAGNOSIS — Z836 Family history of other diseases of the respiratory system: Secondary | ICD-10-CM | POA: Insufficient documentation

## 2018-11-14 DIAGNOSIS — I420 Dilated cardiomyopathy: Secondary | ICD-10-CM | POA: Diagnosis not present

## 2018-11-14 DIAGNOSIS — Z9889 Other specified postprocedural states: Secondary | ICD-10-CM | POA: Insufficient documentation

## 2018-11-14 DIAGNOSIS — Z7901 Long term (current) use of anticoagulants: Secondary | ICD-10-CM | POA: Diagnosis not present

## 2018-11-14 DIAGNOSIS — R918 Other nonspecific abnormal finding of lung field: Secondary | ICD-10-CM | POA: Insufficient documentation

## 2018-11-14 DIAGNOSIS — I480 Paroxysmal atrial fibrillation: Secondary | ICD-10-CM | POA: Diagnosis not present

## 2018-11-14 DIAGNOSIS — I25810 Atherosclerosis of coronary artery bypass graft(s) without angina pectoris: Secondary | ICD-10-CM | POA: Diagnosis not present

## 2018-11-14 DIAGNOSIS — J90 Pleural effusion, not elsewhere classified: Secondary | ICD-10-CM | POA: Diagnosis not present

## 2018-11-14 DIAGNOSIS — N183 Chronic kidney disease, stage 3 (moderate): Secondary | ICD-10-CM | POA: Insufficient documentation

## 2018-11-14 LAB — CBC
HCT: 41.1 % (ref 39.0–52.0)
Hemoglobin: 13.5 g/dL (ref 13.0–17.0)
MCH: 30.6 pg (ref 26.0–34.0)
MCHC: 32.8 g/dL (ref 30.0–36.0)
MCV: 93.2 fL (ref 80.0–100.0)
Platelets: 333 10*3/uL (ref 150–400)
RBC: 4.41 MIL/uL (ref 4.22–5.81)
RDW: 12.7 % (ref 11.5–15.5)
WBC: 9.4 10*3/uL (ref 4.0–10.5)
nRBC: 0 % (ref 0.0–0.2)

## 2018-11-14 LAB — COMPREHENSIVE METABOLIC PANEL
ALT: 24 U/L (ref 0–44)
AST: 24 U/L (ref 15–41)
Albumin: 3.7 g/dL (ref 3.5–5.0)
Alkaline Phosphatase: 63 U/L (ref 38–126)
Anion gap: 11 (ref 5–15)
BUN: 22 mg/dL (ref 8–23)
CO2: 20 mmol/L — ABNORMAL LOW (ref 22–32)
Calcium: 8.8 mg/dL — ABNORMAL LOW (ref 8.9–10.3)
Chloride: 96 mmol/L — ABNORMAL LOW (ref 98–111)
Creatinine, Ser: 2.26 mg/dL — ABNORMAL HIGH (ref 0.61–1.24)
GFR calc Af Amer: 30 mL/min — ABNORMAL LOW (ref >60)
GFR calc non Af Amer: 26 mL/min — ABNORMAL LOW (ref >60)
Glucose, Bld: 114 mg/dL — ABNORMAL HIGH (ref 70–99)
Potassium: 4.7 mmol/L (ref 3.5–5.1)
Sodium: 127 mmol/L — ABNORMAL LOW (ref 135–145)
Total Bilirubin: 0.5 mg/dL (ref 0.3–1.2)
Total Protein: 6.4 g/dL — ABNORMAL LOW (ref 6.5–8.1)

## 2018-11-14 LAB — BRAIN NATRIURETIC PEPTIDE: B Natriuretic Peptide: 313.2 pg/mL — ABNORMAL HIGH (ref 0.0–100.0)

## 2018-11-14 NOTE — Progress Notes (Signed)
Advanced Heart Failure Clinic Note   Referring Physician: [facility] Primary Care: [name], MD Primary Cardiologist: Radford Pax  HPI:  [name] is a 85 y.o. male  with history of hypertension, CAD status post CABG and pericardial tissue AVR 2025, chronic systolic HF ejection fraction 25-30% s/p CRT-D, PAF (on Eliquis)  and orthostatic hypotension referred by Dr. Radford Pax for CHF evaluation.   Saw Dr. Curt Bears 04/18/17 and device interrogation showed multiple episodes of ventricular tachycardia below his detection limit. Was taken off amiodarone due to shortness of breath (had been on for several years). Had CT scan in 11/18 which showed COPD and multiple new pulmonary nodules - unclear if related to amio or possibly malignancy. Repeat CT scheduled for 3/19  Dr. Curt Bears lowered his monitor zone and detection zone and started him on mexiletine. Patient had 30 seconds of wide complex tachycardia rate of 117 bpm 04/30/17 but had just started his mexiletine because of insurance reasons so Dr. Curt Bears wanted to continue to monitor this. Had televisit with Dr. Curt Bears in 5/20 and was stable on mexilitene.   Admitted 3/9 -> 06/20/17 with recurrent pleural effusion and markedly elevated BNP. Underwent Thoracentesis x 2. Had NSVT up to 40 beats, electrolytes supped aggressively. PYP scan 06/20/17 NOT suggestive of TTR amyloid. Pleurex placed on R in 4/19 and effusion resolved. Removed 6/19  Had recurrent AF and underwent DC-CV by Dr. Debara Pickett in 2/20  He presents today for follow up with his daughter. Doing fairly well. No edema, orthopnea or PND. Has been out working in the garden without much problem. No CP or palpitations. Main concern is his kidneys. Last creatinine 2.06 in 2/20. Pending appt with [facility]. No more problems with R pleural effusion.    Review of systems complete and found to be negative unless listed in HPI.    Past Medical History:  Diagnosis Date  . Arthritis    "minor  everywhere" (04/22/2015)  . Asthma    "a touch"  . Barrett esophagus   . CAD (coronary artery disease)    a. s/p 3 vvessel CABG with LIMA to LAD, SVG to diagonal 1, SVG to RCA 12/09.  . Cardiac resynchronization therapy defibrillator (CRT-D) in place   . Chronic anticoagulation 01/23/2013  . Chronic combined systolic and diastolic CHF (congestive heart failure) (HCC)    dry weight 197-200lbs.  . CKD (chronic kidney disease) stage 3, GFR 30-59 ml/min (HCC) 08/23/2014  . DCM (dilated cardiomyopathy) ([place])    initially concerned for TTP amyloidosis but Tc-PYP scan 06/2017 was not consistent with amyloid and SPEP/UPEP with no M spike. EF 20-25% by echo 2019  . Dyslipidemia 01/23/2013  . Essential hypertension 01/23/2013  . GERD (gastroesophageal reflux disease)   . GI bleed 05/08/2017  . H/O: rheumatic fever   . Hepatitis 1957   "don't know what kind:  . History of hiatal hernia   . History of PFTs    a. Amiodarone started 5/16 >> PFTs w/ DLCO 6/16:  FEV1 72% predicted, FEV1/FVC 66%, DLCO 66% >> minimal reversible obstructive airways disease with mild diffusion defect (suggestive of emphysema but absence of hyperinflation inconsistent with dx)  . Hyperkalemia 08/23/2014  . Hyperthyroidism 05/26/2017  . Leg weakness 01/28/2017  . Multiple thyroid nodules 08/13/2017  . Orthostatic hypotension   . Persistent atrial fibrillation 01/23/2013  . Pleural effusion on right 05/26/2017  . Pneumonia 08/2014   "dr thought I may have had a touch"  . Pre-diabetes   . Protein-calorie malnutrition, severe  05/27/2017  . S/P AVR (aortic valve replacement) 2009   a. severe AS s/p AVR with pericardial tissue valve 2009.  Marland Kitchen Shingles 08/23/2014  . VT (ventricular tachycardia) ([place]) 05/06/2017    Current Outpatient Medications  Medication Sig Dispense Refill  . albuterol (PROVENTIL HFA;VENTOLIN HFA) 108 (90 Base) MCG/ACT inhaler Inhale 1-2 puffs into the lungs every 6 (six) hours as needed for wheezing.     Marland Kitchen  atorvastatin (LIPITOR) 20 MG tablet TAKE 1 TABLET EVERY DAY  AT  6  PM 90 tablet 3  . carvedilol (COREG) 25 MG tablet Take 1 tablet (25 mg total) by mouth 2 (two) times daily. 180 tablet 3  . ELIQUIS 2.5 MG TABS tablet TAKE 1 TABLET TWICE DAILY 180 tablet 1  . famotidine (PEPCID) 40 MG tablet Take 40 mg by mouth daily.    . furosemide (LASIX) 40 MG tablet TAKE 1 TABLET TWICE DAILY 180 tablet 3  . hydrALAZINE (APRESOLINE) 25 MG tablet TAKE 1/2 TABLET (12.5MG) BY MOUTH 2 (TWO) TIMES DAILY BEFORE MEALS (Patient taking differently: Take 12.5 mg by mouth 2 (two) times daily. ) 90 tablet 3  . isosorbide mononitrate (IMDUR) 30 MG 24 hr tablet TAKE 1 TABLET EVERY DAY 90 tablet 3  . mexiletine (MEXITIL) 150 MG capsule Take 2 capsules (300 mg total) by mouth 3 (three) times daily. 540 capsule 3  . Multiple Vitamin (MULTIVITAMIN WITH MINERALS) TABS tablet Take 1 tablet by mouth daily.     . Omega-3 Fatty Acids (FISH OIL) 1000 MG CAPS Take 2,000 mg by mouth 2 (two) times daily.    . pantoprazole (PROTONIX) 40 MG tablet Take 1 tablet by mouth daily.    . potassium chloride SA (K-DUR,KLOR-CON) 20 MEQ tablet Take 1 tablet (20 mEq total) by mouth daily. (Patient taking differently: Take 20 mEq by mouth every other day. ) 90 tablet 3  . traMADol (ULTRAM) 50 MG tablet Take 1 tablet (50 mg total) by mouth every 12 (twelve) hours as needed for moderate pain or severe pain. 20 tablet 0   No current facility-administered medications for this encounter.    Allergies  Allergen Reactions  . Novocain [Procaine] Swelling    SWELLING REACTION UNSPECIFIED   . Amiodarone Nausea And Vomiting   Social History   Socioeconomic History  . Marital status: Married    Spouse name: Not on file  . Number of children: Not on file  . Years of education: Not on file  . Highest education level: Not on file  Occupational History  . Not on file  Social Needs  . Financial resource strain: Not on file  . Food insecurity     Worry: Not on file    Inability: Not on file  . Transportation needs    Medical: Not on file    Non-medical: Not on file  Tobacco Use  . Smoking status: Never Smoker  . Smokeless tobacco: Never Used  Substance and Sexual Activity  . Alcohol use: Not Currently    Comment: 04/22/2015 "nothing since the 1980s; never had a problem w/it"  . Drug use: No  . Sexual activity: Never  Lifestyle  . Physical activity    Days per week: Not on file    Minutes per session: Not on file  . Stress: Not on file  Relationships  . Social Herbalist on phone: Not on file    Gets together: Not on file    Attends religious service: Not on file  Active member of club or organization: Not on file    Attends meetings of clubs or organizations: Not on file    Relationship status: Not on file  . Intimate partner violence    Fear of current or ex partner: Not on file    Emotionally abused: Not on file    Physically abused: Not on file    Forced sexual activity: Not on file  Other Topics Concern  . Not on file  Social History Narrative   Married, lives with spouse [name]   4 children, 2 boys and 2 girls > one daughter passed   OCCUPATION: retired, Pension scheme manager for [employer]      Family History  Problem Relation Age of Onset  . Alzheimer's disease Mother   . COPD Daughter   . Alzheimer's disease Other   . COPD Sister   . Depression Sister   . Heart disease Sister   . Hyperlipidemia Sister   . Hypertension Sister   . Esophageal cancer Sister        + smoker    Vitals:   11/14/18 1441  BP: 130/86  Pulse: 91  SpO2: 97%  Weight: 90.4 kg (199 lb 3.2 oz)     Wt Readings from Last 3 Encounters:  11/14/18 90.4 kg (199 lb 3.2 oz)  08/01/18 86.2 kg (190 lb)  05/28/18 85.7 kg (189 lb)    PHYSICAL EXAM: General:  Elderly. No resp difficulty HEENT: normal Neck: supple. JVP 5-6. Carotids 2+ bilat; no bruits. No lymphadenopathy or thryomegaly appreciated. Cor: PMI  nondisplaced. Regular rate & rhythm. No rubs, gallops or murmurs. Lungs: clear Abdomen: soft, nontender, nondistended. No hepatosplenomegaly. No bruits or masses. Good bowel sounds. Extremities: no cyanosis, clubbing, rash, edema Neuro: alert & orientedx3, cranial nerves grossly intact. moves all 4 extremities w/o difficulty. Affect pleasant  ASSESSMENT & PLAN:  1. Recurrent R Pleural effusion - Had Pleurex placed 4/19 after multiple thoracentesis - Effusions resolved and Pluerex removed 6/19 - No evidence of recurrence on exam today  2. Chronic systolic HF due to ICM s/p CRT-D - Echo 05/27/2017 LVEF 20-25% - Improved NYHA II-early III  - Volume status ok  - Continue lasix 40 mg BID - Continue coreg 12.5 mg BID - Continue hydralazine 12.5 mg BID and imdur 15 mg daily. For now.  - Not on ACE/ARB/ARNI/MRA due to CKD   3. CAD s/p CABG/AVR 2009 - No s/s of ischemia    - On eliquis, statin  4. VT - Followed by Dr. Curt Bears. VT quiescent on mexilitene  5. Lung nodules - CT with contrast 3/8/19with multiple nodules previously noted no longer seen, stable 3 mm LLL nodule noted, new 3 mm RUL nodule noted.  - No change.   6. Paroxysmal AF - s/p DC-CV in 2/20 - Regular on exam - Continue Eliquis 2.5 mg BID.   7. CKD 3-4 - Has appt pending with [facility]   Glori Bickers, MD  3:10 PM

## 2018-11-14 NOTE — Patient Instructions (Signed)
Labs were done today. We will call you with any ABNORMAL results. No news is good news!  Your physician wants you to follow-up in: 6 MONTHS. You will receive a reminder letter in the mail two months in advance. If you don't receive a letter, please call our office to schedule the follow-up appointment.  At the [facility], you and your health needs are our priority. As part of our continuing mission to provide you with exceptional heart care, we have created designated Provider Care Teams. These Care Teams include your primary Cardiologist (physician) and Advanced Practice Providers (APPs- Physician Assistants and Nurse Practitioners) who all work together to provide you with the care you need, when you need it.   You may see any of the following providers on your designated Care Team at your next follow up: Marland Kitchen Dr Glori Bickers . Dr Loralie Champagne . Darrick Grinder, NP   Please be sure to bring in all your medications bottles to every appointment.

## 2018-11-17 DIAGNOSIS — Z961 Presence of intraocular lens: Secondary | ICD-10-CM | POA: Diagnosis not present

## 2018-11-17 DIAGNOSIS — H04123 Dry eye syndrome of bilateral lacrimal glands: Secondary | ICD-10-CM | POA: Diagnosis not present

## 2018-11-17 DIAGNOSIS — H02051 Trichiasis without entropian right upper eyelid: Secondary | ICD-10-CM | POA: Diagnosis not present

## 2018-11-17 DIAGNOSIS — E119 Type 2 diabetes mellitus without complications: Secondary | ICD-10-CM | POA: Diagnosis not present

## 2018-11-18 ENCOUNTER — Ambulatory Visit (INDEPENDENT_AMBULATORY_CARE_PROVIDER_SITE_OTHER): Payer: Medicare HMO | Admitting: *Deleted

## 2018-11-18 DIAGNOSIS — I420 Dilated cardiomyopathy: Secondary | ICD-10-CM

## 2018-11-19 ENCOUNTER — Telehealth: Payer: Self-pay | Admitting: *Deleted

## 2018-11-19 LAB — CUP PACEART REMOTE DEVICE CHECK
Battery Remaining Longevity: 41 mo
Battery Voltage: 2.95 V
Brady Statistic AP VP Percent: 89.17 %
Brady Statistic AP VS Percent: 0.93 %
Brady Statistic AS VP Percent: 9.52 %
Brady Statistic AS VS Percent: 0.38 %
Brady Statistic RA Percent Paced: 87.35 %
Brady Statistic RV Percent Paced: 96.53 %
Date Time Interrogation Session: 20200811132119
HighPow Impedance: 82 Ohm
Implantable Lead Implant Date: 20170113
Implantable Lead Implant Date: 20170113
Implantable Lead Implant Date: 20170113
Implantable Lead Location: 753858
Implantable Lead Location: 753859
Implantable Lead Location: 753860
Implantable Lead Model: 4298
Implantable Lead Model: 5076
Implantable Pulse Generator Implant Date: 20170113
Lead Channel Impedance Value: 380 Ohm
Lead Channel Impedance Value: 399 Ohm
Lead Channel Impedance Value: 399 Ohm
Lead Channel Impedance Value: 399 Ohm
Lead Channel Impedance Value: 456 Ohm
Lead Channel Impedance Value: 456 Ohm
Lead Channel Impedance Value: 456 Ohm
Lead Channel Impedance Value: 532 Ohm
Lead Channel Impedance Value: 665 Ohm
Lead Channel Impedance Value: 703 Ohm
Lead Channel Impedance Value: 817 Ohm
Lead Channel Impedance Value: 817 Ohm
Lead Channel Impedance Value: 836 Ohm
Lead Channel Pacing Threshold Amplitude: 0.375 V
Lead Channel Pacing Threshold Amplitude: 0.5 V
Lead Channel Pacing Threshold Amplitude: 0.75 V
Lead Channel Pacing Threshold Pulse Width: 0.4 ms
Lead Channel Pacing Threshold Pulse Width: 0.4 ms
Lead Channel Pacing Threshold Pulse Width: 0.4 ms
Lead Channel Sensing Intrinsic Amplitude: 1 mV
Lead Channel Sensing Intrinsic Amplitude: 1 mV
Lead Channel Sensing Intrinsic Amplitude: 7.375 mV
Lead Channel Sensing Intrinsic Amplitude: 7.375 mV
Lead Channel Setting Pacing Amplitude: 1.5 V
Lead Channel Setting Pacing Amplitude: 1.5 V
Lead Channel Setting Pacing Amplitude: 2 V
Lead Channel Setting Pacing Pulse Width: 0.4 ms
Lead Channel Setting Pacing Pulse Width: 0.4 ms
Lead Channel Setting Sensing Sensitivity: 0.3 mV

## 2018-11-19 NOTE — Telephone Encounter (Signed)
Scheduled remote from 11/18/18 reviewed. VT treated with ATP x2 noted on 09/08/18 at 14:16. Spoke with patient. He does not recall any symptoms from that date, no recent symptomatic episodes. Compliant with cardiac medications, including mexiletine. Advised of [place] DMV driving restrictions x6 months. Pt verbalizes understanding. Advised will call back if any additional recommendations from Dr. Curt Bears. No further questions at this time.   Full report exported to Dr. Curt Bears for review.

## 2018-11-19 NOTE — Telephone Encounter (Addendum)
Notes recorded by Constance Haw, MD on 11/19/2018 at 2:57 PM EDT  Abnormal device interrogation reviewed. Lead parameters and battery status stable. Continued episodes of VT. Start amidarone 200 mg daily.   Per allergy list, pt has a history of intolerance to amiodarone. Routed to Dr. Curt Bears for recommendations.

## 2018-11-21 MED ORDER — RANOLAZINE ER 1000 MG PO TB12: 1000.0000 mg | ORAL_TABLET | Freq: Two times a day (BID) | ORAL | 3 refills | Status: DC

## 2018-11-21 NOTE — Telephone Encounter (Signed)
Start ranexa 1000 mg BID

## 2018-11-21 NOTE — Telephone Encounter (Signed)
Pt has been notified device results and recommendation. After reviewing the chart Dr. Curt Bears revised his recommendation from starting Amiodarone to now start Ranexa 1000 mg BID. Pt is aware we are NOT starting Amiodarone. Pt is agreeable to starting Ranexa. New Rx has [facility] [place]. I advised pt if he has any problems over the weekend he can call the office # [phone] and s/w the On-Call Provider. Pt thanked me for the call and my help.  Also see phone note from [facility], Therapist, sports.  Patient notified of result.  Please refer to phone note from today for complete details.   Julaine Hua, [place] 11/21/2018 5:20 PM

## 2018-11-21 NOTE — Addendum Note (Signed)
Addended by: Michae Kava on: 11/21/2018 05:24 PM   Modules accepted: Orders

## 2018-11-26 ENCOUNTER — Encounter: Payer: Self-pay | Admitting: Cardiology

## 2018-11-26 NOTE — Progress Notes (Signed)
Remote ICD transmission.

## 2018-11-28 DIAGNOSIS — H6121 Impacted cerumen, right ear: Secondary | ICD-10-CM | POA: Diagnosis not present

## 2018-11-28 DIAGNOSIS — R112 Nausea with vomiting, unspecified: Secondary | ICD-10-CM | POA: Diagnosis not present

## 2018-11-29 ENCOUNTER — Other Ambulatory Visit: Payer: Self-pay

## 2018-11-29 ENCOUNTER — Emergency Department (HOSPITAL_COMMUNITY): Payer: Medicare HMO

## 2018-11-29 ENCOUNTER — Inpatient Hospital Stay (HOSPITAL_COMMUNITY)
Admission: EM | Admit: 2018-11-29 | Discharge: 2018-12-01 | DRG: 552 | Disposition: A | Discharge status: Home Health | Payer: Medicare HMO | Attending: Internal Medicine | Admitting: Internal Medicine

## 2018-11-29 ENCOUNTER — Encounter (HOSPITAL_COMMUNITY): Payer: Self-pay

## 2018-11-29 DIAGNOSIS — N183 Chronic kidney disease, stage 3 (moderate): Secondary | ICD-10-CM | POA: Diagnosis not present

## 2018-11-29 DIAGNOSIS — I672 Cerebral atherosclerosis: Secondary | ICD-10-CM | POA: Diagnosis present

## 2018-11-29 DIAGNOSIS — K759 Inflammatory liver disease, unspecified: Secondary | ICD-10-CM | POA: Diagnosis present

## 2018-11-29 DIAGNOSIS — Z20828 Contact with and (suspected) exposure to other viral communicable diseases: Secondary | ICD-10-CM | POA: Diagnosis not present

## 2018-11-29 DIAGNOSIS — R9082 White matter disease, unspecified: Secondary | ICD-10-CM | POA: Diagnosis present

## 2018-11-29 DIAGNOSIS — E875 Hyperkalemia: Secondary | ICD-10-CM | POA: Diagnosis present

## 2018-11-29 DIAGNOSIS — E871 Hypo-osmolality and hyponatremia: Secondary | ICD-10-CM | POA: Diagnosis present

## 2018-11-29 DIAGNOSIS — Z79899 Other long term (current) drug therapy: Secondary | ICD-10-CM

## 2018-11-29 DIAGNOSIS — E039 Hypothyroidism, unspecified: Secondary | ICD-10-CM | POA: Diagnosis present

## 2018-11-29 DIAGNOSIS — G629 Polyneuropathy, unspecified: Secondary | ICD-10-CM | POA: Diagnosis present

## 2018-11-29 DIAGNOSIS — I420 Dilated cardiomyopathy: Secondary | ICD-10-CM | POA: Diagnosis not present

## 2018-11-29 DIAGNOSIS — Z952 Presence of prosthetic heart valve: Secondary | ICD-10-CM

## 2018-11-29 DIAGNOSIS — R339 Retention of urine, unspecified: Secondary | ICD-10-CM | POA: Diagnosis present

## 2018-11-29 DIAGNOSIS — I480 Paroxysmal atrial fibrillation: Secondary | ICD-10-CM | POA: Diagnosis not present

## 2018-11-29 DIAGNOSIS — Z7901 Long term (current) use of anticoagulants: Secondary | ICD-10-CM | POA: Diagnosis not present

## 2018-11-29 DIAGNOSIS — I10 Essential (primary) hypertension: Secondary | ICD-10-CM | POA: Diagnosis not present

## 2018-11-29 DIAGNOSIS — R29898 Other symptoms and signs involving the musculoskeletal system: Secondary | ICD-10-CM | POA: Diagnosis present

## 2018-11-29 DIAGNOSIS — Z951 Presence of aortocoronary bypass graft: Secondary | ICD-10-CM

## 2018-11-29 DIAGNOSIS — R262 Difficulty in walking, not elsewhere classified: Secondary | ICD-10-CM | POA: Diagnosis not present

## 2018-11-29 DIAGNOSIS — Z888 Allergy status to other drugs, medicaments and biological substances status: Secondary | ICD-10-CM

## 2018-11-29 DIAGNOSIS — M47814 Spondylosis without myelopathy or radiculopathy, thoracic region: Secondary | ICD-10-CM | POA: Diagnosis not present

## 2018-11-29 DIAGNOSIS — Z95810 Presence of automatic (implantable) cardiac defibrillator: Secondary | ICD-10-CM

## 2018-11-29 DIAGNOSIS — I2510 Atherosclerotic heart disease of native coronary artery without angina pectoris: Secondary | ICD-10-CM | POA: Diagnosis not present

## 2018-11-29 DIAGNOSIS — K219 Gastro-esophageal reflux disease without esophagitis: Secondary | ICD-10-CM | POA: Diagnosis present

## 2018-11-29 DIAGNOSIS — I5042 Chronic combined systolic (congestive) and diastolic (congestive) heart failure: Secondary | ICD-10-CM | POA: Diagnosis not present

## 2018-11-29 DIAGNOSIS — I472 Ventricular tachycardia: Secondary | ICD-10-CM | POA: Diagnosis not present

## 2018-11-29 DIAGNOSIS — E785 Hyperlipidemia, unspecified: Secondary | ICD-10-CM | POA: Diagnosis present

## 2018-11-29 DIAGNOSIS — M48061 Spinal stenosis, lumbar region without neurogenic claudication: Secondary | ICD-10-CM | POA: Diagnosis not present

## 2018-11-29 DIAGNOSIS — Z955 Presence of coronary angioplasty implant and graft: Secondary | ICD-10-CM

## 2018-11-29 DIAGNOSIS — I130 Hypertensive heart and chronic kidney disease with heart failure and stage 1 through stage 4 chronic kidney disease, or unspecified chronic kidney disease: Secondary | ICD-10-CM | POA: Diagnosis not present

## 2018-11-29 DIAGNOSIS — M6281 Muscle weakness (generalized): Secondary | ICD-10-CM | POA: Diagnosis not present

## 2018-11-29 DIAGNOSIS — I959 Hypotension, unspecified: Secondary | ICD-10-CM | POA: Diagnosis not present

## 2018-11-29 DIAGNOSIS — R112 Nausea with vomiting, unspecified: Secondary | ICD-10-CM | POA: Diagnosis not present

## 2018-11-29 DIAGNOSIS — M4807 Spinal stenosis, lumbosacral region: Secondary | ICD-10-CM | POA: Diagnosis not present

## 2018-11-29 DIAGNOSIS — R531 Weakness: Secondary | ICD-10-CM | POA: Diagnosis not present

## 2018-11-29 DIAGNOSIS — Z884 Allergy status to anesthetic agent status: Secondary | ICD-10-CM

## 2018-11-29 DIAGNOSIS — M4803 Spinal stenosis, cervicothoracic region: Secondary | ICD-10-CM | POA: Diagnosis not present

## 2018-11-29 DIAGNOSIS — R7303 Prediabetes: Secondary | ICD-10-CM | POA: Diagnosis present

## 2018-11-29 DIAGNOSIS — M4815 Ankylosing hyperostosis [Forestier], thoracolumbar region: Secondary | ICD-10-CM | POA: Diagnosis present

## 2018-11-29 DIAGNOSIS — Z825 Family history of asthma and other chronic lower respiratory diseases: Secondary | ICD-10-CM

## 2018-11-29 DIAGNOSIS — N184 Chronic kidney disease, stage 4 (severe): Secondary | ICD-10-CM | POA: Diagnosis present

## 2018-11-29 DIAGNOSIS — I4819 Other persistent atrial fibrillation: Secondary | ICD-10-CM | POA: Diagnosis not present

## 2018-11-29 DIAGNOSIS — K449 Diaphragmatic hernia without obstruction or gangrene: Secondary | ICD-10-CM | POA: Diagnosis present

## 2018-11-29 DIAGNOSIS — Z79891 Long term (current) use of opiate analgesic: Secondary | ICD-10-CM

## 2018-11-29 DIAGNOSIS — M5126 Other intervertebral disc displacement, lumbar region: Secondary | ICD-10-CM | POA: Diagnosis not present

## 2018-11-29 DIAGNOSIS — I700 Atherosclerosis of aorta: Secondary | ICD-10-CM | POA: Diagnosis not present

## 2018-11-29 LAB — CBC WITH DIFFERENTIAL/PLATELET
Abs Immature Granulocytes: 0.06 10*3/uL (ref 0.00–0.07)
Basophils Absolute: 0 10*3/uL (ref 0.0–0.1)
Basophils Relative: 0 %
Eosinophils Absolute: 0.1 10*3/uL (ref 0.0–0.5)
Eosinophils Relative: 0 %
HCT: 41.1 % (ref 39.0–52.0)
Hemoglobin: 13.7 g/dL (ref 13.0–17.0)
Immature Granulocytes: 1 %
Lymphocytes Relative: 19 %
Lymphs Abs: 2.2 10*3/uL (ref 0.7–4.0)
MCH: 30.4 pg (ref 26.0–34.0)
MCHC: 33.3 g/dL (ref 30.0–36.0)
MCV: 91.3 fL (ref 80.0–100.0)
Monocytes Absolute: 1.9 10*3/uL — ABNORMAL HIGH (ref 0.1–1.0)
Monocytes Relative: 17 %
Neutro Abs: 6.9 10*3/uL (ref 1.7–7.7)
Neutrophils Relative %: 63 %
Platelets: 258 10*3/uL (ref 150–400)
RBC: 4.5 MIL/uL (ref 4.22–5.81)
RDW: 13 % (ref 11.5–15.5)
WBC: 11.1 10*3/uL — ABNORMAL HIGH (ref 4.0–10.5)
nRBC: 0 % (ref 0.0–0.2)

## 2018-11-29 LAB — BASIC METABOLIC PANEL
Anion gap: 12 (ref 5–15)
BUN: 28 mg/dL — ABNORMAL HIGH (ref 8–23)
CO2: 22 mmol/L (ref 22–32)
Calcium: 8.5 mg/dL — ABNORMAL LOW (ref 8.9–10.3)
Chloride: 93 mmol/L — ABNORMAL LOW (ref 98–111)
Creatinine, Ser: 2.31 mg/dL — ABNORMAL HIGH (ref 0.61–1.24)
GFR calc Af Amer: 29 mL/min — ABNORMAL LOW (ref >60)
GFR calc non Af Amer: 25 mL/min — ABNORMAL LOW (ref >60)
Glucose, Bld: 103 mg/dL — ABNORMAL HIGH (ref 70–99)
Potassium: 4.2 mmol/L (ref 3.5–5.1)
Sodium: 127 mmol/L — ABNORMAL LOW (ref 135–145)

## 2018-11-29 LAB — COMPREHENSIVE METABOLIC PANEL
ALT: 24 U/L (ref 0–44)
AST: 35 U/L (ref 15–41)
Albumin: 3.5 g/dL (ref 3.5–5.0)
Alkaline Phosphatase: 45 U/L (ref 38–126)
Anion gap: 12 (ref 5–15)
BUN: 29 mg/dL — ABNORMAL HIGH (ref 8–23)
CO2: 20 mmol/L — ABNORMAL LOW (ref 22–32)
Calcium: 8.6 mg/dL — ABNORMAL LOW (ref 8.9–10.3)
Chloride: 93 mmol/L — ABNORMAL LOW (ref 98–111)
Creatinine, Ser: 2.29 mg/dL — ABNORMAL HIGH (ref 0.61–1.24)
GFR calc Af Amer: 29 mL/min — ABNORMAL LOW (ref >60)
GFR calc non Af Amer: 25 mL/min — ABNORMAL LOW (ref >60)
Glucose, Bld: 103 mg/dL — ABNORMAL HIGH (ref 70–99)
Potassium: 5.4 mmol/L — ABNORMAL HIGH (ref 3.5–5.1)
Sodium: 125 mmol/L — ABNORMAL LOW (ref 135–145)
Total Bilirubin: 1.1 mg/dL (ref 0.3–1.2)
Total Protein: 5.8 g/dL — ABNORMAL LOW (ref 6.5–8.1)

## 2018-11-29 LAB — URINALYSIS, ROUTINE W REFLEX MICROSCOPIC
Bilirubin Urine: NEGATIVE
Glucose, UA: NEGATIVE mg/dL
Hgb urine dipstick: NEGATIVE
Ketones, ur: NEGATIVE mg/dL
Leukocytes,Ua: NEGATIVE
Nitrite: NEGATIVE
Protein, ur: NEGATIVE mg/dL
Specific Gravity, Urine: 1.01 (ref 1.005–1.030)
pH: 5 (ref 5.0–8.0)

## 2018-11-29 MED ORDER — MEXILETINE HCL 150 MG PO CAPS
300.0000 mg | ORAL_CAPSULE | Freq: Three times a day (TID) | ORAL | Status: DC
  Administered 2018-11-29 – 2018-12-01 (×6): 300 mg via ORAL
  Filled 2018-11-29 (×8): qty 2

## 2018-11-29 MED ORDER — ONDANSETRON HCL 4 MG/2ML IJ SOLN: 4.0000 mg | Freq: Four times a day (QID) | INTRAMUSCULAR | Status: DC | PRN

## 2018-11-29 MED ORDER — PANTOPRAZOLE SODIUM 40 MG PO TBEC
40.0000 mg | DELAYED_RELEASE_TABLET | Freq: Every day | ORAL | Status: DC
  Administered 2018-11-30 – 2018-12-01 (×2): 40 mg via ORAL
  Filled 2018-11-29 (×2): qty 1

## 2018-11-29 MED ORDER — OMEGA-3-ACID ETHYL ESTERS 1 G PO CAPS
1.0000 g | ORAL_CAPSULE | Freq: Two times a day (BID) | ORAL | Status: DC
  Administered 2018-11-29 – 2018-12-01 (×4): 1 g via ORAL
  Filled 2018-11-29 (×4): qty 1

## 2018-11-29 MED ORDER — APIXABAN 2.5 MG PO TABS
2.5000 mg | ORAL_TABLET | Freq: Two times a day (BID) | ORAL | Status: DC
  Administered 2018-11-29 – 2018-12-01 (×4): 2.5 mg via ORAL
  Filled 2018-11-29 (×4): qty 1

## 2018-11-29 MED ORDER — ACETAMINOPHEN 650 MG RE SUPP: 650.0000 mg | Freq: Four times a day (QID) | RECTAL | Status: DC | PRN

## 2018-11-29 MED ORDER — RANOLAZINE ER 500 MG PO TB12
1000.0000 mg | ORAL_TABLET | Freq: Two times a day (BID) | ORAL | Status: DC
  Administered 2018-11-29 – 2018-12-01 (×4): 1000 mg via ORAL
  Filled 2018-11-29 (×5): qty 2

## 2018-11-29 MED ORDER — FUROSEMIDE 40 MG PO TABS
40.0000 mg | ORAL_TABLET | Freq: Two times a day (BID) | ORAL | Status: DC
  Administered 2018-11-29 – 2018-12-01 (×5): 40 mg via ORAL
  Filled 2018-11-29 (×5): qty 1

## 2018-11-29 MED ORDER — ONDANSETRON HCL 4 MG PO TABS: 4.0000 mg | ORAL_TABLET | Freq: Four times a day (QID) | ORAL | Status: DC | PRN

## 2018-11-29 MED ORDER — ISOSORBIDE MONONITRATE ER 30 MG PO TB24
30.0000 mg | ORAL_TABLET | Freq: Every day | ORAL | Status: DC
  Administered 2018-11-30 – 2018-12-01 (×2): 30 mg via ORAL
  Filled 2018-11-29 (×2): qty 1

## 2018-11-29 MED ORDER — CARVEDILOL 25 MG PO TABS
25.0000 mg | ORAL_TABLET | Freq: Two times a day (BID) | ORAL | Status: DC
  Administered 2018-11-29 – 2018-12-01 (×4): 25 mg via ORAL
  Filled 2018-11-29 (×4): qty 1

## 2018-11-29 MED ORDER — ATORVASTATIN CALCIUM 10 MG PO TABS
20.0000 mg | ORAL_TABLET | Freq: Every day | ORAL | Status: DC
  Administered 2018-11-29 – 2018-12-01 (×3): 20 mg via ORAL
  Filled 2018-11-29 (×3): qty 2

## 2018-11-29 MED ORDER — FAMOTIDINE 20 MG PO TABS
40.0000 mg | ORAL_TABLET | Freq: Every day | ORAL | Status: DC
  Administered 2018-11-30 – 2018-12-01 (×2): 40 mg via ORAL
  Filled 2018-11-29 (×2): qty 2

## 2018-11-29 MED ORDER — HYDRALAZINE HCL 25 MG PO TABS
12.5000 mg | ORAL_TABLET | Freq: Two times a day (BID) | ORAL | Status: DC
  Administered 2018-11-30 – 2018-12-01 (×3): 12.5 mg via ORAL
  Filled 2018-11-29 (×3): qty 1

## 2018-11-29 MED ORDER — DEXAMETHASONE SODIUM PHOSPHATE 10 MG/ML IJ SOLN
10.0000 mg | Freq: Once | INTRAMUSCULAR | Status: AC
  Administered 2018-11-29: 10 mg via INTRAVENOUS
  Filled 2018-11-29: qty 1

## 2018-11-29 MED ORDER — ACETAMINOPHEN 325 MG PO TABS: 650.0000 mg | ORAL_TABLET | Freq: Four times a day (QID) | ORAL | Status: DC | PRN

## 2018-11-29 NOTE — H&P (Signed)
TRH H&P   Patient Demographics:    [name], is a 85 y.o. male  MRN: [id number]   DOB - [birth year]  Admit Date - 11/29/2018  Outpatient Primary MD for the patient is [name], MD  Referring MD/NP/PA: Dr Jeanell Sparrow  Patient coming from: Home  Chief Complaint  Patient presents with   Fatigue      HPI:    [name]  is a 85 y.o. male, with history of hypertension, CAD status post CABG and pericardial tissue AVR 7829, chronic systolic HF ejection fraction 25-30% s/p CRT-D, PAF (on Eliquis) , history of V. tach followed by EP Dr. Terese Door on mexiletine, current pleural effusion in the past required pleural drain, patient presents to ED secondary to complaints of lower extremity weakness.  Patient reports generalized weakness that began before 48 hours, but at baseline he ambulates with no assistance, started to use his walker before couple days, but he is took Phenergan recently for nausea, he denies any fever, chills, headache, chest pain, shortness of breath, diarrhea, report after Phenergan was very sleepy, reports that it knocked him out for 12 hours, report this morning he could not get up from chair, and fell backward, injury to back or head, ports he had these episodes happened in the past, he was volume overloaded and it did improve with physical therapy. - in ED patient was noted to have weakness in lower extremity bilaterally, CT head with no acute findings, CT thoracic/lumbar spine significant for extensive atherosclerotic disease, significant for spinal stenosis at L4-5 with mild central and bilateral foraminal narrowing, patient was seen by neurology service, there was no concern for cord compression, or infarction, and I was called for admission   Review of systems:    In addition to the HPI above,  No Fever-chills, No Headache, No changes with Vision or hearing, No  problems swallowing food or Liquids, No Chest pain, Cough or Shortness of Breath, No Abdominal pain, No Nausea or Vommitting, Bowel movements are regular, No Blood in stool or Urine, No dysuria, No new skin rashes or bruises, No new joints pains-aches,  Skin complaints of bilateral lower extremity weakness No recent weight gain or loss, No polyuria, polydypsia or polyphagia, No significant Mental Stressors.  A full 10 point Review of Systems was done, except as stated above, all other Review of Systems were negative.   With Past History of the following :    Past Medical History:  Diagnosis Date   Arthritis    "minor everywhere" (04/22/2015)   Asthma    "a touch"   Barrett esophagus    CAD (coronary artery disease)    a. s/p 3 vvessel CABG with LIMA to LAD, SVG to diagonal 1, SVG to RCA 12/09.   Cardiac resynchronization therapy defibrillator (CRT-D) in place    Chronic anticoagulation 01/23/2013   Chronic combined systolic and diastolic CHF (congestive  heart failure) (HCC)    dry weight 197-200lbs.   CKD (chronic kidney disease) stage 3, GFR 30-59 ml/min (HCC) 08/23/2014   DCM (dilated cardiomyopathy) ([place])    initially concerned for TTP amyloidosis but Tc-PYP scan 06/2017 was not consistent with amyloid and SPEP/UPEP with no M spike. EF 20-25% by echo 2019   Dyslipidemia 01/23/2013   Essential hypertension 01/23/2013   GERD (gastroesophageal reflux disease)    GI bleed 05/08/2017   H/O: rheumatic fever    Hepatitis 1957   "don't know what kind:   History of hiatal hernia    History of PFTs    a. Amiodarone started 5/16 >> PFTs w/ DLCO 6/16:  FEV1 72% predicted, FEV1/FVC 66%, DLCO 66% >> minimal reversible obstructive airways disease with mild diffusion defect (suggestive of emphysema but absence of hyperinflation inconsistent with dx)   Hyperkalemia 08/23/2014   Hyperthyroidism 05/26/2017   Leg weakness 01/28/2017   Multiple thyroid nodules 08/13/2017    Orthostatic hypotension    Persistent atrial fibrillation 01/23/2013   Pleural effusion on right 05/26/2017   Pneumonia 08/2014   "dr thought I may have had a touch"   Pre-diabetes    Protein-calorie malnutrition, severe 05/27/2017   S/P AVR (aortic valve replacement) 2009   a. severe AS s/p AVR with pericardial tissue valve 2009.   Shingles 08/23/2014   VT (ventricular tachycardia) ([place]) 05/06/2017      Past Surgical History:  Procedure Laterality Date   AORTIC VALVE REPLACEMENT  with 23-mm Magna Ease pericardial valve, model number   with 23-mm Magna Ease pericardial valve, model number3300TFX, serial number [id number]    APPENDECTOMY  1940s   BI-VENTRICULAR IMPLANTABLE CARDIOVERTER DEFIBRILLATOR  (CRT-D)  04/22/2015   CARDIAC CATHETERIZATION N/A 11/04/2014   Procedure: Left Heart Cath and Cors/Grafts Angiography;  Surgeon: Leonie Man, MD;  Location: [place] [facility];  Service: Cardiovascular;  Laterality: N/A;   CARDIAC VALVE REPLACEMENT     CARDIOVERSION N/A 01/27/2013   Procedure: CARDIOVERSION;  Surgeon: Sueanne Margarita, MD;  Location: [place];  Service: Cardiovascular;  Laterality: N/A;   CARDIOVERSION N/A 08/25/2014   Procedure: CARDIOVERSION;  Surgeon: Sanda Klein, MD;  Location: [place] [facility];  Service: Cardiovascular;  Laterality: N/A;   CARDIOVERSION N/A 05/28/2018   Procedure: CARDIOVERSION;  Surgeon: Pixie Casino, MD;  Location: [facility];  Service: Cardiovascular;  Laterality: N/A;   CATARACT EXTRACTION W/ INTRAOCULAR LENS  IMPLANT, BILATERAL Bilateral    CHEST TUBE INSERTION Right 07/26/2017   Procedure: INSERTION PLEURAL DRAINAGE CATHETER - RIGHT;  Surgeon: Ivin Poot, MD;  Location: [place];  Service: Thoracic;  Laterality: Right;   CORONARY ARTERY BYPASS GRAFT  03/10/2008   x 3 Dr. Roxan Hockey   EP IMPLANTABLE DEVICE N/A 04/22/2015   Procedure: BiV ICD Insertion CRT-D;  Surgeon: [name], MD;  Location: [place] [facility];   Service: Cardiovascular;  Laterality: N/A;   ESOPHAGOGASTRODUODENOSCOPY (EGD) WITH ESOPHAGEAL DILATION  X1   ESOPHAGOGASTRODUODENOSCOPY (EGD) WITH PROPOFOL N/A 05/09/2017   Procedure: ESOPHAGOGASTRODUODENOSCOPY (EGD) WITH PROPOFOL;  Surgeon: Carol Ada, MD;  Location: [place];  Service: Endoscopy;  Laterality: N/A;   IR THORACENTESIS ASP PLEURAL SPACE W/IMG GUIDE  05/27/2017   IR THORACENTESIS ASP PLEURAL SPACE W/IMG GUIDE  06/17/2017   IR THORACENTESIS ASP PLEURAL SPACE W/IMG GUIDE  06/18/2017   IR THORACENTESIS ASP PLEURAL SPACE W/IMG GUIDE  07/11/2017   REMOVAL OF PLEURAL DRAINAGE CATHETER Right 09/13/2017   Procedure: REMOVAL OF PLEURAL DRAINAGE CATHETER;  Surgeon:  Ivin Poot, MD;  Location: [place];  Service: Thoracic;  Laterality: Right;   RIGHT HEART CATH AND CORONARY/GRAFT ANGIOGRAPHY N/A 05/31/2017   Procedure: RIGHT HEART CATH AND CORONARY/GRAFT ANGIOGRAPHY;  Surgeon: Jolaine Artist, MD;  Location: [place] [facility];  Service: Cardiovascular;  Laterality: N/A;   TONSILLECTOMY        Social History:     Social History   Tobacco Use   Smoking status: Never Smoker   Smokeless tobacco: Never Used  Substance Use Topics   Alcohol use: Not Currently    Comment: 04/22/2015 "nothing since the 1980s; never had a problem w/it"      Family History :     Family History  Problem Relation Age of Onset   Alzheimer's disease Mother    COPD Daughter    Alzheimer's disease Other    COPD Sister    Depression Sister    Heart disease Sister    Hyperlipidemia Sister    Hypertension Sister    Esophageal cancer Sister        + smoker      Home Medications:   Prior to Admission medications   Medication Sig Start Date End Date Taking? Authorizing Provider  acetaminophen (TYLENOL) 325 MG tablet Take 650 mg by mouth every 6 (six) hours as needed for mild pain.   Yes [provider]  albuterol (PROVENTIL HFA;VENTOLIN HFA) 108 (90 Base) MCG/ACT  inhaler Inhale 1-2 puffs into the lungs every 6 (six) hours as needed for wheezing.    Yes [provider]  atorvastatin (LIPITOR) 20 MG tablet TAKE 1 TABLET EVERY DAY  AT  6  PM Patient taking differently: Take 20 mg by mouth daily at 6 PM.  08/27/18  Yes Camnitz, Will Hassell Done, MD  carvedilol (COREG) 25 MG tablet Take 1 tablet (25 mg total) by mouth 2 (two) times daily. 05/23/18 11/14/19 Yes Camnitz, Will Hassell Done, MD  ELIQUIS 2.5 MG TABS tablet TAKE 1 TABLET TWICE DAILY Patient taking differently: Take 2.5 mg by mouth 2 (two) times daily.  08/28/18  Yes Camnitz, Will Hassell Done, MD  furosemide (LASIX) 40 MG tablet TAKE 1 TABLET TWICE DAILY Patient taking differently: Take 40 mg by mouth 2 (two) times daily.  08/27/18  Yes Turner, Eber Hong, MD  hydrALAZINE (APRESOLINE) 25 MG tablet TAKE 1/2 TABLET (12.5MG) BY MOUTH 2 (TWO) TIMES DAILY BEFORE MEALS Patient taking differently: Take 12.5 mg by mouth 2 (two) times daily.  04/03/18  Yes Turner, Eber Hong, MD  isosorbide mononitrate (IMDUR) 30 MG 24 hr tablet TAKE 1 TABLET EVERY DAY Patient taking differently: Take 30 mg by mouth daily.  08/27/18  Yes Camnitz, Ocie Doyne, MD  mexiletine (MEXITIL) 150 MG capsule Take 2 capsules (300 mg total) by mouth 3 (three) times daily. 07/07/18  Yes Camnitz, Will Hassell Done, MD  Multiple Vitamin (MULTIVITAMIN WITH MINERALS) TABS tablet Take 1 tablet by mouth daily.    Yes [provider]  Omega-3 Fatty Acids (FISH OIL) 1000 MG CAPS Take 2,000 mg by mouth 2 (two) times daily.   Yes [provider]  pantoprazole (PROTONIX) 40 MG tablet Take 1 tablet by mouth daily. 04/04/18  Yes [provider]  potassium chloride SA (K-DUR,KLOR-CON) 20 MEQ tablet Take 1 tablet (20 mEq total) by mouth daily. Patient taking differently: Take 20 mEq by mouth every other day.  09/23/17  Yes Turner, Eber Hong, MD  promethazine (PHENERGAN) 25 MG tablet Take 25 mg by mouth every 8 (eight) hours.  Yes [provider]    ranolazine (RANEXA) 1000 MG SR tablet Take 1 tablet (1,000 mg total) by mouth 2 (two) times daily. 11/21/18  Yes Camnitz, Will Hassell Done, MD  traMADol (ULTRAM) 50 MG tablet Take 1 tablet (50 mg total) by mouth every 12 (twelve) hours as needed for moderate pain or severe pain. 05/10/17  Yes Rai, Ripudeep K, MD  famotidine (PEPCID) 40 MG tablet Take 40 mg by mouth daily. 07/01/18   [provider]     Allergies:     Allergies  Allergen Reactions   Novocain [Procaine] Swelling    SWELLING REACTION UNSPECIFIED    Amiodarone Nausea And Vomiting     Physical Exam:   Vitals  Blood pressure 136/68, pulse 60, temperature 98.3 F (36.8 C), temperature source Rectal, resp. rate 13, height 6' (1.829 m), weight 87.1 kg, SpO2 96 %.   1. General frail elderly male, laying in bed in no apparent distress  2. Normal affect and insight, Not Suicidal or Homicidal, Awake Alert, Oriented X 3.  3. No F.N deficits, ALL C.Nerves Intact,  Sensation intact all 4 extremities, patient has bilateral lower extremity.  Till exam was deferred, but it is reported to have decreased rectal tone by ED physician  4. Ears and Eyes appear Normal, Conjunctivae clear, PERRLA. Moist Oral Mucosa.  5. Supple Neck, No JVD, No cervical lymphadenopathy appriciated, No Carotid Bruits.  6. Symmetrical Chest wall movement, Good air movement bilaterally, CTAB.  7. RRR, No Gallops, Rubs or Murmurs, No Parasternal Heave.  8. Positive Bowel Sounds, Abdomen Soft, No tenderness, No organomegaly appriciated,No rebound -guarding or rigidity.  9.  No Cyanosis, Normal Skin Turgor, No Skin Rash or Bruise.  10. Good muscle tone,  joints appear normal , no effusions, Normal ROM.  11. No Palpable Lymph Nodes in Neck or Axillae    Data Review:    CBC Recent Labs  Lab 11/29/18 1034  WBC 11.1*  HGB 13.7  HCT 41.1  PLT 258  MCV 91.3  MCH 30.4  MCHC 33.3  RDW 13.0  LYMPHSABS 2.2  MONOABS 1.9*  EOSABS 0.1  BASOSABS  0.0   ------------------------------------------------------------------------------------------------------------------  Chemistries  Recent Labs  Lab 11/29/18 1034  NA 125*  K 5.4*  CL 93*  CO2 20*  GLUCOSE 103*  BUN 29*  CREATININE 2.29*  CALCIUM 8.6*  AST 35  ALT 24  ALKPHOS 45  BILITOT 1.1   ------------------------------------------------------------------------------------------------------------------ estimated creatinine clearance is 26.8 mL/min (A) (by C-G formula based on SCr of 2.29 mg/dL (H)). ------------------------------------------------------------------------------------------------------------------ No results for input(s): TSH, T4TOTAL, T3FREE, THYROIDAB in the last 72 hours.  Invalid input(s): FREET3  Coagulation profile No results for input(s): INR, PROTIME in the last 168 hours. ------------------------------------------------------------------------------------------------------------------- No results for input(s): DDIMER in the last 72 hours. -------------------------------------------------------------------------------------------------------------------  Cardiac Enzymes No results for input(s): CKMB, TROPONINI, MYOGLOBIN in the last 168 hours.  Invalid input(s): CK ------------------------------------------------------------------------------------------------------------------    Component Value Date/Time   BNP 313.2 (H) 11/14/2018 1523     ---------------------------------------------------------------------------------------------------------------  Urinalysis    Component Value Date/Time   COLORURINE YELLOW 11/29/2018 [place] 11/29/2018 1131   LABSPEC 1.010 11/29/2018 1131   PHURINE 5.0 11/29/2018 [place] 11/29/2018 [place] 11/29/2018 [place] 11/29/2018 [place] 11/29/2018 1131   PROTEINUR NEGATIVE 11/29/2018 1131   UROBILINOGEN 0.2  03/08/2008 1519   NITRITE NEGATIVE 11/29/2018 [place] 11/29/2018 1131    ----------------------------------------------------------------------------------------------------------------  Imaging Results:    Ct Head Wo Contrast  Result Date: 11/29/2018 CLINICAL DATA:  Generalized weakness EXAM: CT HEAD WITHOUT CONTRAST TECHNIQUE: Contiguous axial images were obtained from the base of the skull through the vertex without intravenous contrast. COMPARISON:  None. FINDINGS: Brain: Moderate brain atrophy pattern and chronic microvascular changes throughout both cerebral hemispheres. Bilateral lacunar type infarcts in the basal ganglia superiorly. No acute intracranial hemorrhage mass lesion, infarction, shift, herniation, hydrocephalus, extra-axial fluid collection. No focal mass effect or edema. Cisterns are patent. Cerebellar atrophy as well. Vascular: Intracranial atherosclerosis.  No hyperdense vessel. Skull: Normal. Negative for fracture or focal lesion. Sinuses/Orbits: No acute finding. Other: None. IMPRESSION: Chronic atrophy and white matter microvascular changes. No acute intracranial abnormality by noncontrast CT. Electronically Signed   By: Jerilynn Mages.  Shick M.D.   On: 11/29/2018 12:41   Ct Thoracic Spine Wo Contrast  Result Date: 11/29/2018 CLINICAL DATA:  Generalized weakness beginning at 7:30 a.m. today. The patient is now unable to move or walk. Significant lower extremity weakness and decreased tone. EXAM: CT THORACIC SPINE WITHOUT CONTRAST TECHNIQUE: Multidetector CT images of the thoracic were obtained using the standard protocol without intravenous contrast. COMPARISON:  CT of the chest 06/14/2017 FINDINGS: Alignment: Exaggerated thoracic kyphosis is again noted. There is no significant listhesis or curvature. Vertebrae: Vertebral body heights are maintained. Mild marrow heterogeneity is similar the prior study. Fused anterior osteophytes are present. Paraspinal and other  soft tissues: Dense atherosclerotic calcifications are present in the aorta. Calcified granuloma is present in the right upper lobe on image 34 series 4. Visualized lung fields are otherwise clear. No significant effusion or pneumothorax is present. Visualized upper abdomen is unremarkable. Pacemaker wires are present. Aortic valve replacement is noted. Extensive coronary artery calcifications are present. Disc levels: No significant thoracic disc disease or central canal stenosis is present to explain new onset of lower extremity weakness. Foramina are patent. Right foraminal narrowing is present at C7-T1. IMPRESSION: 1. Aortic Atherosclerosis (ICD10-I70.0). Given the degree of atherosclerotic disease, cord ischemia should be considered. 2. Aortic valve replacement. 3. Pacemaker wires preclude MRI for evaluation of the spinal cord. 4. Exaggerated thoracic kyphosis and degenerative changes without acute abnormality of the thoracic spine. 5. Fused anterior osteophytes compatible with DISH. Electronically Signed   By: San Morelle M.D.   On: 11/29/2018 12:41   Ct Lumbar Spine Wo Contrast  Result Date: 11/29/2018 CLINICAL DATA:  Back pain with rapidly progressing neurologic deficit. Generalized weakness beginning at 7:30 a.m. The patient states he was able to move normally at 6 a.m. and is now unable to move or walk. EXAM: CT LUMBAR SPINE WITHOUT CONTRAST TECHNIQUE: Multidetector CT imaging of the lumbar spine was performed without intravenous contrast administration. Multiplanar CT image reconstructions were also generated. COMPARISON:  CT of the abdomen and pelvis 05/28/2017 FINDINGS: Segmentation: 5 non rib-bearing lumbar type vertebral bodies are present. The lowest fully formed vertebral body is L5. Alignment: Normal lumbar lordosis is present. No significant listhesis or scoliosis is present Vertebrae: Vertebral body heights are normal. No focal lytic or blastic lesions are present. Paraspinal and  other soft tissues: Atherosclerotic calcifications are present in the aorta without aneurysm. Dense calcifications are present at the ostia of the celiac artery, superior mesenteric artery, and left greater than right renal arteries. Extensive diverticular changes are present in the visualized sigmoid colon without associated inflammatory change. Disc levels: L1-2: Negative. L2-3: Negative. L3-4: A shallow rightward disc protrusion is present. Asymmetric moderate right-sided facet hypertrophy is  noted. Mild right foraminal narrowing is present. L4-5: A broad-based disc protrusion is present. Advanced facet hypertrophy is noted. This results in mild central and bilateral foraminal stenosis, right greater than left. L5-S1: A mild broad-based disc protrusion is present. Advanced facet hypertrophy is worse right than left. Facet spurring contributes to mild bilateral foraminal stenosis. IMPRESSION: 1. No acute or focal abnormality to explain sudden onset of weakness. 2. Extensive atherosclerotic disease. Vascular insult to the spinal cord should be considered. 3. Ostial stenosis involving the superior mesenteric artery and left greater than right renal artery. 4. Spinal stenosis is greatest at L4-5 with mild central and bilateral foraminal narrowing, right greater than left. 5. Facet spurring contributes to mild foraminal narrowing bilaterally at L5-S1. 6. Asymmetric right-sided facet hypertrophy and disc protrusion at L3-4 results in mild right foraminal stenosis. Electronically Signed   By: San Morelle M.D.   On: 11/29/2018 12:29    My personal review of EKG: Paced rhythm   Assessment & Plan:    Active Problems:   Coronary artery disease   Chronic anticoagulation   CKD (chronic kidney disease) stage 3, GFR 30-59 ml/min (HCC)   Chronic combined systolic and diastolic heart failure (HCC)   DCM (dilated cardiomyopathy) (HCC)   Lower extremity weakness   Bilateral lower extremity weakness -I have  discussed with neurology, there is no neurosurgical emergency here, his lower extremities are not flaccid, findings are more likely related to spinal stenosis, recommendation for MRI/lumbar spine, give patient 1 dose of Decadron currently, will consult PT/OT per neuro recommendation.  Chronic systolic/diastolic  CHF, status post AICD - Appears to be euvolemic currently, will resume his home dose Lasix once CMP resulted - Continue with Coreg, hydralazine and Imdur - Echo 05/27/2017 LVEF 20-25% - Not on ACE/ARB/ARNI/MRA due to CKD   Atrial fibrillation, paroxysmal -This post DCCV and 2/20, continue with Coreg, mexiletine and Eliquis\  History of V. Tach -Followed by EP Dr. Terese Door, on mexiletine  -Continue Coreg -Currently rate controlled -Continue Eliquis  CKD stage III -Follow on CMP  History of CAD -She denies any chest pain or shortness of breath    DVT Prophylaxis Eliquis SCDs  AM Labs Ordered, also please review Full Orders  Family Communication: Admission, patients condition and plan of care including tests being ordered have been discussed with the patient  who indicate understanding and agree with the plan and Code Status.  Code Status Full  Likely DC to  home  Condition GUARDED    Consults called:  neurology  Admission status: inpatient  Time spent in minutes : 60 minutes   Phillips Climes M.D on 11/29/2018 at 2:49 PM  Between 7am to 7pm - Pager - [phone]. After 7pm go to [email or web address] - password [facility]  [facility] - Office  [phone]

## 2018-11-29 NOTE — ED Triage Notes (Signed)
Pt from home; c/o weakness that began around 0730 this am; recently placed on phenergan for chronic vomiting; endorses loss of appetite x 3 weeks; denies injury; pt states he is able to walk normally, ableto walk when he got up at 0600  168/86 HR 76 RR 18 97% RA CBG 160

## 2018-11-29 NOTE — Consult Note (Addendum)
Requesting Physician: Dr. Jeanell Sparrow    Chief Complaint: Bilateral leg weakness  History obtained from: Patient and Chart     HPI:                                                                                                                                       [name] is a 85 y.o. male with past medical history of coronary artery disease, CKD, dilated cardiomyopathy with ICD, hypertension, hyperlipidemia, prediabetes, hypothyroidism, protein calorie malnutrition, chronic neuropathy presents to the emergency department after unable to get up from the chair earlier today.  Patient states for the last few weeks he has been having increased generalized weakness.  Since yesterday the patient has been having nausea.  He went up this morning a couple of times to the bathroom.  Around 8 AM when he tried to get up from the chair.   On assessment by EDP, patient noted to have weakness in both legs, urinary retention.  Stat CT of the showed lumbar stenosis.  CT head showed significant white matter disease.  Neurology was consulted regarding further recommendations.  Patient states that this happened to him in the past in the setting of worsening heart disease.  However his strength had returned and he can normally walk without a walker or cane.  He also states he has chronic neuropathy and reduced sensation in both legs up to the knees.  Denies any back pain.  Denies any burning of feet or shooting pain from his back to his legs.  Past Medical History:  Diagnosis Date  . Arthritis    "minor everywhere" (04/22/2015)  . Asthma    "a touch"  . Barrett esophagus   . CAD (coronary artery disease)    a. s/p 3 vvessel CABG with LIMA to LAD, SVG to diagonal 1, SVG to RCA 12/09.  . Cardiac resynchronization therapy defibrillator (CRT-D) in place   . Chronic anticoagulation 01/23/2013  . Chronic combined systolic and diastolic CHF (congestive heart failure) (HCC)    dry weight 197-200lbs.  . CKD (chronic  kidney disease) stage 3, GFR 30-59 ml/min (HCC) 08/23/2014  . DCM (dilated cardiomyopathy) ([place])    initially concerned for TTP amyloidosis but Tc-PYP scan 06/2017 was not consistent with amyloid and SPEP/UPEP with no M spike. EF 20-25% by echo 2019  . Dyslipidemia 01/23/2013  . Essential hypertension 01/23/2013  . GERD (gastroesophageal reflux disease)   . GI bleed 05/08/2017  . H/O: rheumatic fever   . Hepatitis 1957   "don't know what kind:  . History of hiatal hernia   . History of PFTs    a. Amiodarone started 5/16 >> PFTs w/ DLCO 6/16:  FEV1 72% predicted, FEV1/FVC 66%, DLCO 66% >> minimal reversible obstructive airways disease with mild diffusion defect (suggestive of emphysema but absence of hyperinflation inconsistent with dx)  . Hyperkalemia 08/23/2014  . Hyperthyroidism 05/26/2017  . Leg  weakness 01/28/2017  . Multiple thyroid nodules 08/13/2017  . Orthostatic hypotension   . Persistent atrial fibrillation 01/23/2013  . Pleural effusion on right 05/26/2017  . Pneumonia 08/2014   "dr thought I may have had a touch"  . Pre-diabetes   . Protein-calorie malnutrition, severe 05/27/2017  . S/P AVR (aortic valve replacement) 2009   a. severe AS s/p AVR with pericardial tissue valve 2009.  Marland Kitchen Shingles 08/23/2014  . VT (ventricular tachycardia) ([place]) 05/06/2017    Past Surgical History:  Procedure Laterality Date  . AORTIC VALVE REPLACEMENT  with 23-mm Magna Ease pericardial valve, model number   with 23-mm Magna Ease pericardial valve, model number3300TFX, serial number [id number]   . APPENDECTOMY  1940s  . BI-VENTRICULAR IMPLANTABLE CARDIOVERTER DEFIBRILLATOR  (CRT-D)  04/22/2015  . CARDIAC CATHETERIZATION N/A 11/04/2014   Procedure: Left Heart Cath and Cors/Grafts Angiography;  Surgeon: Leonie Man, MD;  Location: [place] [facility];  Service: Cardiovascular;  Laterality: N/A;  . CARDIAC VALVE REPLACEMENT    . CARDIOVERSION N/A 01/27/2013   Procedure: CARDIOVERSION;  Surgeon: Sueanne Margarita, MD;  Location: [place];  Service: Cardiovascular;  Laterality: N/A;  . CARDIOVERSION N/A 08/25/2014   Procedure: CARDIOVERSION;  Surgeon: Sanda Klein, MD;  Location: [place] [facility];  Service: Cardiovascular;  Laterality: N/A;  . CARDIOVERSION N/A 05/28/2018   Procedure: CARDIOVERSION;  Surgeon: Pixie Casino, MD;  Location: [facility];  Service: Cardiovascular;  Laterality: N/A;  . CATARACT EXTRACTION W/ INTRAOCULAR LENS  IMPLANT, BILATERAL Bilateral   . CHEST TUBE INSERTION Right 07/26/2017   Procedure: INSERTION PLEURAL DRAINAGE CATHETER - RIGHT;  Surgeon: Ivin Poot, MD;  Location: [place];  Service: Thoracic;  Laterality: Right;  . CORONARY ARTERY BYPASS GRAFT  03/10/2008   x 3 Dr. Roxan Hockey  . EP IMPLANTABLE DEVICE N/A 04/22/2015   Procedure: BiV ICD Insertion CRT-D;  Surgeon: [name], MD;  Location: [place] [facility];  Service: Cardiovascular;  Laterality: N/A;  . ESOPHAGOGASTRODUODENOSCOPY (EGD) WITH ESOPHAGEAL DILATION  X1  . ESOPHAGOGASTRODUODENOSCOPY (EGD) WITH PROPOFOL N/A 05/09/2017   Procedure: ESOPHAGOGASTRODUODENOSCOPY (EGD) WITH PROPOFOL;  Surgeon: Carol Ada, MD;  Location: [place];  Service: Endoscopy;  Laterality: N/A;  . IR THORACENTESIS ASP PLEURAL SPACE W/IMG GUIDE  05/27/2017  . IR THORACENTESIS ASP PLEURAL SPACE W/IMG GUIDE  06/17/2017  . IR THORACENTESIS ASP PLEURAL SPACE W/IMG GUIDE  06/18/2017  . IR THORACENTESIS ASP PLEURAL SPACE W/IMG GUIDE  07/11/2017  . REMOVAL OF PLEURAL DRAINAGE CATHETER Right 09/13/2017   Procedure: REMOVAL OF PLEURAL DRAINAGE CATHETER;  Surgeon: Ivin Poot, MD;  Location: [place];  Service: Thoracic;  Laterality: Right;  . RIGHT HEART CATH AND CORONARY/GRAFT ANGIOGRAPHY N/A 05/31/2017   Procedure: RIGHT HEART CATH AND CORONARY/GRAFT ANGIOGRAPHY;  Surgeon: Jolaine Artist, MD;  Location: [place] [facility];  Service: Cardiovascular;  Laterality: N/A;  . TONSILLECTOMY      Family History  Problem  Relation Age of Onset  . Alzheimer's disease Mother   . COPD Daughter   . Alzheimer's disease Other   . COPD Sister   . Depression Sister   . Heart disease Sister   . Hyperlipidemia Sister   . Hypertension Sister   . Esophageal cancer Sister        + smoker   Social History:  reports that he has never smoked. He has never used smokeless tobacco. He reports previous alcohol use. He reports that he does not use drugs.  Allergies:  Allergies  Allergen Reactions  . Novocain [Procaine] Swelling    SWELLING REACTION UNSPECIFIED   . Amiodarone Nausea And Vomiting    Medications:                                                                                                                        I reviewed home medications.  ROS:                                                                                                                                     14 systems reviewed and negative except above   Examination:                                                                                                      General: Appears well-developed and well-nourished.  Psych: Affect appropriate to situation Eyes: No scleral injection HENT: No OP obstrucion Head: Normocephalic.  Cardiovascular: Normal rate and regular rhythm.  Respiratory: Effort normal and breath sounds normal to anterior ascultation GI: Soft.  No distension. There is no tenderness.  Skin: WDI    Neurological Examination Mental Status: Alert, oriented, thought content appropriate.  Speech fluent without evidence of aphasia. Able to follow 3 step commands without difficulty. Cranial Nerves: II: Visual fields grossly normal,  III,IV, VI: ptosis not present, extra-ocular motions intact bilaterally, pupils equal, round, reactive to light and accommodation V,VII: smile symmetric, facial light touch sensation normal bilaterally VIII: hearing normal bilaterally IX,X: uvula rises symmetrically XI: bilateral  shoulder shrug XII: midline tongue extension Motor: Right : Upper extremity    Left:     Upper extremity 5/5 deltoid       5/5 deltoid 5/5 tricep      5/5 tricep 5/5 biceps      5/5 biceps  5/5wrist flexion     5/5 wrist flexion 5/5 wrist extension     5/5 wrist extension 5/5 hand grip      5/5 hand grip  Lower extremity  Lower extremity 3/5 hip flexor      3/5 hip flexor 4+/5 hip adductors     4+/5 hip adductors 4+/5 hip abductors     4+/5 hip abductors 5/5 quadricep      5/5 quadriceps  4/5 hamstrings     4//5 hamstrings 4+/5 plantar flexion       4+/5 plantar flexion 4+/5 plantar extension     4+/5 plantar extension Tone and bulk: generalized wasting throughout all muscle groups Deep Tendon Reflexes:  Right: Upper Extremity   Left: Upper extremity   biceps (C-5 to C-6) 1/4   biceps (C-5 to C-6) 1/4 tricep (C7) 0/4    triceps (C7) 0/4 Brachioradialis (C6) 0/4  Brachioradialis (C6) 0/4  Lower Extremity Lower Extremity  quadriceps (L-2 to L-4) 0/4   quadriceps (L-2 to L-4) 1/4 Achilles (S1) 0/4   Achilles (S1) 0/4 Plantars: Right: downgoing   Left: downgoing Sensory: Pinprick and light touch intact throughout, but diminished below level of mid shin in both legs Cerebellar: normal finger-to-nose, normal rapid alternating movements, unable to perform heel to shin test  Gait: unable to perform due to safety     Lab Results: Basic Metabolic Panel: Recent Labs  Lab 11/29/18 1034  NA 125*  K 5.4*  CL 93*  CO2 20*  GLUCOSE 103*  BUN 29*  CREATININE 2.29*  CALCIUM 8.6*    CBC: Recent Labs  Lab 11/29/18 1034  WBC 11.1*  NEUTROABS 6.9  HGB 13.7  HCT 41.1  MCV 91.3  PLT 258    Coagulation Studies: No results for input(s): LABPROT, INR in the last 72 hours.  Imaging: Ct Head Wo Contrast  Result Date: 11/29/2018 CLINICAL DATA:  Generalized weakness EXAM: CT HEAD WITHOUT CONTRAST TECHNIQUE: Contiguous axial images were obtained from the base of the skull  through the vertex without intravenous contrast. COMPARISON:  None. FINDINGS: Brain: Moderate brain atrophy pattern and chronic microvascular changes throughout both cerebral hemispheres. Bilateral lacunar type infarcts in the basal ganglia superiorly. No acute intracranial hemorrhage mass lesion, infarction, shift, herniation, hydrocephalus, extra-axial fluid collection. No focal mass effect or edema. Cisterns are patent. Cerebellar atrophy as well. Vascular: Intracranial atherosclerosis.  No hyperdense vessel. Skull: Normal. Negative for fracture or focal lesion. Sinuses/Orbits: No acute finding. Other: None. IMPRESSION: Chronic atrophy and white matter microvascular changes. No acute intracranial abnormality by noncontrast CT. Electronically Signed   By: Jerilynn Mages.  Shick M.D.   On: 11/29/2018 12:41   Ct Thoracic Spine Wo Contrast  Result Date: 11/29/2018 CLINICAL DATA:  Generalized weakness beginning at 7:30 a.m. today. The patient is now unable to move or walk. Significant lower extremity weakness and decreased tone. EXAM: CT THORACIC SPINE WITHOUT CONTRAST TECHNIQUE: Multidetector CT images of the thoracic were obtained using the standard protocol without intravenous contrast. COMPARISON:  CT of the chest 06/14/2017 FINDINGS: Alignment: Exaggerated thoracic kyphosis is again noted. There is no significant listhesis or curvature. Vertebrae: Vertebral body heights are maintained. Mild marrow heterogeneity is similar the prior study. Fused anterior osteophytes are present. Paraspinal and other soft tissues: Dense atherosclerotic calcifications are present in the aorta. Calcified granuloma is present in the right upper lobe on image 34 series 4. Visualized lung fields are otherwise clear. No significant effusion or pneumothorax is present. Visualized upper abdomen is unremarkable. Pacemaker wires are present. Aortic valve replacement is noted. Extensive coronary artery calcifications are present. Disc levels: No  significant thoracic disc disease or central canal stenosis is present to explain new onset of lower  extremity weakness. Foramina are patent. Right foraminal narrowing is present at C7-T1. IMPRESSION: 1. Aortic Atherosclerosis (ICD10-I70.0). Given the degree of atherosclerotic disease, cord ischemia should be considered. 2. Aortic valve replacement. 3. Pacemaker wires preclude MRI for evaluation of the spinal cord. 4. Exaggerated thoracic kyphosis and degenerative changes without acute abnormality of the thoracic spine. 5. Fused anterior osteophytes compatible with DISH. Electronically Signed   By: San Morelle M.D.   On: 11/29/2018 12:41   Ct Lumbar Spine Wo Contrast  Result Date: 11/29/2018 CLINICAL DATA:  Back pain with rapidly progressing neurologic deficit. Generalized weakness beginning at 7:30 a.m. The patient states he was able to move normally at 6 a.m. and is now unable to move or walk. EXAM: CT LUMBAR SPINE WITHOUT CONTRAST TECHNIQUE: Multidetector CT imaging of the lumbar spine was performed without intravenous contrast administration. Multiplanar CT image reconstructions were also generated. COMPARISON:  CT of the abdomen and pelvis 05/28/2017 FINDINGS: Segmentation: 5 non rib-bearing lumbar type vertebral bodies are present. The lowest fully formed vertebral body is L5. Alignment: Normal lumbar lordosis is present. No significant listhesis or scoliosis is present Vertebrae: Vertebral body heights are normal. No focal lytic or blastic lesions are present. Paraspinal and other soft tissues: Atherosclerotic calcifications are present in the aorta without aneurysm. Dense calcifications are present at the ostia of the celiac artery, superior mesenteric artery, and left greater than right renal arteries. Extensive diverticular changes are present in the visualized sigmoid colon without associated inflammatory change. Disc levels: L1-2: Negative. L2-3: Negative. L3-4: A shallow rightward disc  protrusion is present. Asymmetric moderate right-sided facet hypertrophy is noted. Mild right foraminal narrowing is present. L4-5: A broad-based disc protrusion is present. Advanced facet hypertrophy is noted. This results in mild central and bilateral foraminal stenosis, right greater than left. L5-S1: A mild broad-based disc protrusion is present. Advanced facet hypertrophy is worse right than left. Facet spurring contributes to mild bilateral foraminal stenosis. IMPRESSION: 1. No acute or focal abnormality to explain sudden onset of weakness. 2. Extensive atherosclerotic disease. Vascular insult to the spinal cord should be considered. 3. Ostial stenosis involving the superior mesenteric artery and left greater than right renal artery. 4. Spinal stenosis is greatest at L4-5 with mild central and bilateral foraminal narrowing, right greater than left. 5. Facet spurring contributes to mild foraminal narrowing bilaterally at L5-S1. 6. Asymmetric right-sided facet hypertrophy and disc protrusion at L3-4 results in mild right foraminal stenosis. Electronically Signed   By: San Morelle M.D.   On: 11/29/2018 12:29     I have reviewed the above imaging: CT Head, CT spine of thoracic and lumbar spine, reviewed: appreciate spinal stenosis.    ASSESSMENT AND PLAN  85 y.o. male with past medical history of coronary artery disease, CKD, dilated cardiomyopathy with ICD, hypertension, hyperlipidemia, prediabetes, hypothyroidism, protein calorie malnutrition, chronic neuropathy presents to the emergency department after unable to get up from the chair earlier today.  On my assessment, weakness is most notable to hip flexion.  He does not have significant weakness to bilateral knee flexion/extension or plantar flexion extension.  He also does not have a sensory level or reduced sensation to light touch/pinprick until below mid shin level which he states is chronic with history of polyneuropathy.  Reflexes are  absent in bilateral ankles as well as right patella, it is 1+ left patella.  However he also has diminished reflexes in the bilateral upper extremities with 1+ reflexes on biceps and unable to elicit brachioradialis reflexes.  Because of the absence of sensory level and distribution of weakness favoring proximal thigh muscles, I do think it is less likely to be acute transverse myelitis or spinal cord. Cauda equina syndrome is a possibility but patient does not complain of significant back pain.  Is unlikely to be [place] as he has chronic polyneuropathy to explain diminished reflexes, and presentation was in a matter of hours. Although patient does state that this all happened over a matter of few hours, he may not be reliable historian.Unlikely to be spinal cord infarct as this would present usually with more significant paralysis and sensory loss.    Acute onset bilateral leg weakness, Proximal > distal msucles Hyponatremia   Recommendation - MRI T and L spine w.wo contrast (after coordination with pacemaker rep) -PT/ OT -If leg weakness worsens would consider empiric steroids until MRI  -Infectious and metabolic work-up to explain acute on chronic weakness, sodium is 125 so recommend correcting that.   Karena Addison  [facility] Pager Number [phone]   ADDENDUM Was informed MRI not possible  Hospitalist has called [facility] regarding their input

## 2018-11-29 NOTE — ED Provider Notes (Addendum)
[facility] EMERGENCY DEPARTMENT Provider Note   CSN: [id number] Arrival date & time: 11/29/18  1029     History   Chief Complaint Chief Complaint  Patient presents with  . Fatigue    HPI [name] is a 85 y.o. male.     HPI 85 yo male history ckd, cad presents complaining of generalized weakness that began 2 days ago and started using walker again.  Complaining of ongoing nausea, no fever, chills, headache, chest pain, dyspnea, abdominal pain, diarrhea.  States vomiting some last night.  States took pill for nausea yesteerday that "knocked me out for 12 hours.  Got up at 1 am after sleeping from the medicine.  Went in to bed after going to bathroom. AT 6 am went back to bathroom, got something to eat, then couldn't get up out of chair due to tleg weakness and fell back.  Denies pain or injury to back.  States has happened before in 2019.  Deneis arm weakness. PMD Dr. Olen Pel  Past Medical History:  Diagnosis Date  . Arthritis    "minor everywhere" (04/22/2015)  . Asthma    "a touch"  . Barrett esophagus   . CAD (coronary artery disease)    a. s/p 3 vvessel CABG with LIMA to LAD, SVG to diagonal 1, SVG to RCA 12/09.  . Cardiac resynchronization therapy defibrillator (CRT-D) in place   . Chronic anticoagulation 01/23/2013  . Chronic combined systolic and diastolic CHF (congestive heart failure) (HCC)    dry weight 197-200lbs.  . CKD (chronic kidney disease) stage 3, GFR 30-59 ml/min (HCC) 08/23/2014  . DCM (dilated cardiomyopathy) ([place])    initially concerned for TTP amyloidosis but Tc-PYP scan 06/2017 was not consistent with amyloid and SPEP/UPEP with no M spike. EF 20-25% by echo 2019  . Dyslipidemia 01/23/2013  . Essential hypertension 01/23/2013  . GERD (gastroesophageal reflux disease)   . GI bleed 05/08/2017  . H/O: rheumatic fever   . Hepatitis 1957   "don't know what kind:  . History of hiatal hernia   . History of PFTs    a. Amiodarone  started 5/16 >> PFTs w/ DLCO 6/16:  FEV1 72% predicted, FEV1/FVC 66%, DLCO 66% >> minimal reversible obstructive airways disease with mild diffusion defect (suggestive of emphysema but absence of hyperinflation inconsistent with dx)  . Hyperkalemia 08/23/2014  . Hyperthyroidism 05/26/2017  . Leg weakness 01/28/2017  . Multiple thyroid nodules 08/13/2017  . Orthostatic hypotension   . Persistent atrial fibrillation 01/23/2013  . Pleural effusion on right 05/26/2017  . Pneumonia 08/2014   "dr thought I may have had a touch"  . Pre-diabetes   . Protein-calorie malnutrition, severe 05/27/2017  . S/P AVR (aortic valve replacement) 2009   a. severe AS s/p AVR with pericardial tissue valve 2009.  Marland Kitchen Shingles 08/23/2014  . VT (ventricular tachycardia) ([place]) 05/06/2017    Patient Active Problem List   Diagnosis Date Noted  . Multiple thyroid nodules 08/13/2017  . DCM (dilated cardiomyopathy) ([place]) 06/16/2017  . History of aortic valve replacement with bioprosthetic valve   . Cardiac resynchronization therapy defibrillator (CRT-D) in place   . Protein-calorie malnutrition, severe 05/27/2017  . Hyperthyroidism 05/26/2017  . Cough 05/26/2017  . Pleural effusion on right 05/26/2017  . Malnutrition of moderate degree 05/09/2017  . GI bleed 05/08/2017  . VT (ventricular tachycardia) ([place]) 05/06/2017  . Dizziness 04/23/2017  . Weight loss 04/23/2017  . Leg weakness 01/28/2017  . Chronic combined systolic and diastolic  heart failure ([place])   . Shingles 08/23/2014  . CKD (chronic kidney disease) stage 3, GFR 30-59 ml/min (HCC) 08/23/2014  . Hyperkalemia 08/23/2014  . Essential hypertension 01/23/2013  . Dyslipidemia 01/23/2013  . Coronary artery disease 01/23/2013  . Persistent atrial fibrillation 01/23/2013  . Chronic anticoagulation 01/23/2013    Past Surgical History:  Procedure Laterality Date  . AORTIC VALVE REPLACEMENT  with 23-mm Magna Ease pericardial valve, model number   with 23-mm Magna  Ease pericardial valve, model number3300TFX, serial number [id number]   . APPENDECTOMY  1940s  . BI-VENTRICULAR IMPLANTABLE CARDIOVERTER DEFIBRILLATOR  (CRT-D)  04/22/2015  . CARDIAC CATHETERIZATION N/A 11/04/2014   Procedure: Left Heart Cath and Cors/Grafts Angiography;  Surgeon: Leonie Man, MD;  Location: [facility];  Service: Cardiovascular;  Laterality: N/A;  . CARDIAC VALVE REPLACEMENT    . CARDIOVERSION N/A 01/27/2013   Procedure: CARDIOVERSION;  Surgeon: Sueanne Margarita, MD;  Location: [facility];  Service: Cardiovascular;  Laterality: N/A;  . CARDIOVERSION N/A 08/25/2014   Procedure: CARDIOVERSION;  Surgeon: Sanda Klein, MD;  Location: [facility];  Service: Cardiovascular;  Laterality: N/A;  . CARDIOVERSION N/A 05/28/2018   Procedure: CARDIOVERSION;  Surgeon: Pixie Casino, MD;  Location: [facility];  Service: Cardiovascular;  Laterality: N/A;  . CATARACT EXTRACTION W/ INTRAOCULAR LENS  IMPLANT, BILATERAL Bilateral   . CHEST TUBE INSERTION Right 07/26/2017   Procedure: INSERTION PLEURAL DRAINAGE CATHETER - RIGHT;  Surgeon: Ivin Poot, MD;  Location: [facility];  Service: Thoracic;  Laterality: Right;  . CORONARY ARTERY BYPASS GRAFT  03/10/2008   x 3 Dr. Roxan Hockey  . EP IMPLANTABLE DEVICE N/A 04/22/2015   Procedure: BiV ICD Insertion CRT-D;  Surgeon: [name], MD;  Location: [facility];  Service: Cardiovascular;  Laterality: N/A;  . ESOPHAGOGASTRODUODENOSCOPY (EGD) WITH ESOPHAGEAL DILATION  X1  . ESOPHAGOGASTRODUODENOSCOPY (EGD) WITH PROPOFOL N/A 05/09/2017   Procedure: ESOPHAGOGASTRODUODENOSCOPY (EGD) WITH PROPOFOL;  Surgeon: Carol Ada, MD;  Location: [facility];  Service: Endoscopy;  Laterality: N/A;  . IR THORACENTESIS ASP PLEURAL SPACE W/IMG GUIDE  05/27/2017  . IR THORACENTESIS ASP PLEURAL SPACE W/IMG GUIDE  06/17/2017  . IR THORACENTESIS ASP PLEURAL SPACE W/IMG GUIDE  06/18/2017  . IR THORACENTESIS ASP PLEURAL SPACE W/IMG GUIDE  07/11/2017  .  REMOVAL OF PLEURAL DRAINAGE CATHETER Right 09/13/2017   Procedure: REMOVAL OF PLEURAL DRAINAGE CATHETER;  Surgeon: Ivin Poot, MD;  Location: [facility];  Service: Thoracic;  Laterality: Right;  . RIGHT HEART CATH AND CORONARY/GRAFT ANGIOGRAPHY N/A 05/31/2017   Procedure: RIGHT HEART CATH AND CORONARY/GRAFT ANGIOGRAPHY;  Surgeon: Jolaine Artist, MD;  Location: [facility];  Service: Cardiovascular;  Laterality: N/A;  . TONSILLECTOMY          Home Medications    Prior to Admission medications   Medication Sig Start Date End Date Taking? Authorizing Provider  acetaminophen (TYLENOL) 325 MG tablet Take 650 mg by mouth every 6 (six) hours as needed for mild pain.   Yes [provider]  albuterol (PROVENTIL HFA;VENTOLIN HFA) 108 (90 Base) MCG/ACT inhaler Inhale 1-2 puffs into the lungs every 6 (six) hours as needed for wheezing.    Yes [provider]  atorvastatin (LIPITOR) 20 MG tablet TAKE 1 TABLET EVERY DAY  AT  6  PM Patient taking differently: Take 20 mg by mouth daily at 6 PM.  08/27/18  Yes Camnitz, Will Hassell Done, MD  carvedilol (COREG) 25 MG tablet Take 1 tablet (25 mg  total) by mouth 2 (two) times daily. 05/23/18 11/14/19 Yes Camnitz, Will Hassell Done, MD  ELIQUIS 2.5 MG TABS tablet TAKE 1 TABLET TWICE DAILY Patient taking differently: Take 2.5 mg by mouth 2 (two) times daily.  08/28/18  Yes Camnitz, Will Hassell Done, MD  furosemide (LASIX) 40 MG tablet TAKE 1 TABLET TWICE DAILY Patient taking differently: Take 40 mg by mouth 2 (two) times daily.  08/27/18  Yes Turner, Eber Hong, MD  hydrALAZINE (APRESOLINE) 25 MG tablet TAKE 1/2 TABLET (12.5MG) BY MOUTH 2 (TWO) TIMES DAILY BEFORE MEALS Patient taking differently: Take 12.5 mg by mouth 2 (two) times daily.  04/03/18  Yes Turner, Eber Hong, MD  isosorbide mononitrate (IMDUR) 30 MG 24 hr tablet TAKE 1 TABLET EVERY DAY Patient taking differently: Take 30 mg by mouth daily.  08/27/18  Yes Camnitz, Ocie Doyne, MD  mexiletine  (MEXITIL) 150 MG capsule Take 2 capsules (300 mg total) by mouth 3 (three) times daily. 07/07/18  Yes Camnitz, Will Hassell Done, MD  Multiple Vitamin (MULTIVITAMIN WITH MINERALS) TABS tablet Take 1 tablet by mouth daily.    Yes [provider]  Omega-3 Fatty Acids (FISH OIL) 1000 MG CAPS Take 2,000 mg by mouth 2 (two) times daily.   Yes [provider]  pantoprazole (PROTONIX) 40 MG tablet Take 1 tablet by mouth daily. 04/04/18  Yes [provider]  potassium chloride SA (K-DUR,KLOR-CON) 20 MEQ tablet Take 1 tablet (20 mEq total) by mouth daily. Patient taking differently: Take 20 mEq by mouth every other day.  09/23/17  Yes Turner, Eber Hong, MD  promethazine (PHENERGAN) 25 MG tablet Take 25 mg by mouth every 8 (eight) hours.   Yes [provider]  ranolazine (RANEXA) 1000 MG SR tablet Take 1 tablet (1,000 mg total) by mouth 2 (two) times daily. 11/21/18  Yes Camnitz, Will Hassell Done, MD  traMADol (ULTRAM) 50 MG tablet Take 1 tablet (50 mg total) by mouth every 12 (twelve) hours as needed for moderate pain or severe pain. 05/10/17  Yes Rai, Ripudeep K, MD  famotidine (PEPCID) 40 MG tablet Take 40 mg by mouth daily. 07/01/18   [provider]    Family History Family History  Problem Relation Age of Onset  . Alzheimer's disease Mother   . COPD Daughter   . Alzheimer's disease Other   . COPD Sister   . Depression Sister   . Heart disease Sister   . Hyperlipidemia Sister   . Hypertension Sister   . Esophageal cancer Sister        + smoker    Social History Social History   Tobacco Use  . Smoking status: Never Smoker  . Smokeless tobacco: Never Used  Substance Use Topics  . Alcohol use: Not Currently    Comment: 04/22/2015 "nothing since the 1980s; never had a problem w/it"  . Drug use: No     Allergies   Novocain [procaine] and Amiodarone   Review of Systems Review of Systems   Physical Exam Updated Vital Signs BP 138/68   Pulse 64   Temp  98.3 F (36.8 C) (Rectal)   Resp 16   Ht 1.829 m (6')   Wt 87.1 kg   SpO2 96%   BMI 26.04 kg/m   Physical Exam Vitals signs and nursing note reviewed.  Constitutional:      General: He is in acute distress.     Appearance: Normal appearance. He is not ill-appearing.  HENT:     Head: Normocephalic.  Right Ear: External ear normal.     Left Ear: External ear normal.     Nose: Nose normal.     Mouth/Throat:     Mouth: Mucous membranes are moist.  Eyes:     Extraocular Movements: Extraocular movements intact.     Pupils: Pupils are equal, round, and reactive to light.  Neck:     Musculoskeletal: Normal range of motion.  Cardiovascular:     Rate and Rhythm: Normal rate and regular rhythm.  Pulmonary:     Effort: Pulmonary effort is normal.     Breath sounds: Normal breath sounds.  Abdominal:     General: Abdomen is flat.     Palpations: Abdomen is soft.  Musculoskeletal: Normal range of motion.  Skin:    General: Skin is warm and dry.     Capillary Refill: Capillary refill takes less than 2 seconds.  Neurological:     Mental Status: He is alert.     Cranial Nerves: No cranial nerve deficit.     Motor: Weakness present.     Comments: Decreased rectal tone Ankle jerk decreased bilaterally Patella reflex absent bilaterally Not able to straight leg lift bilaterally Strength equal knee flexors and extensors Equal strenght bilateral plantar and dorsiflexion ankles and toes  Psychiatric:        Mood and Affect: Mood normal.      ED Treatments / Results  Labs (all labs ordered are listed, but only abnormal results are displayed) Labs Reviewed  CBC WITH DIFFERENTIAL/PLATELET - Abnormal; Notable for the following components:      Result Value   WBC 11.1 (*)    Monocytes Absolute 1.9 (*)    All other components within normal limits  URINALYSIS, ROUTINE W REFLEX MICROSCOPIC  COMPREHENSIVE METABOLIC PANEL    EKG EKG Interpretation  Date/Time:  Saturday November 29 2018 10:33:33 EDT Ventricular Rate:  63 PR Interval:    QRS Duration: 158 QT Interval:  522 QTC Calculation: 535 R Axis:   -92 Text Interpretation:  paced rhythm Confirmed by Pattricia Boss [id number]) on 11/29/2018 12:31:57 PM   Radiology Ct Head Wo Contrast  Result Date: 11/29/2018 CLINICAL DATA:  Generalized weakness EXAM: CT HEAD WITHOUT CONTRAST TECHNIQUE: Contiguous axial images were obtained from the base of the skull through the vertex without intravenous contrast. COMPARISON:  None. FINDINGS: Brain: Moderate brain atrophy pattern and chronic microvascular changes throughout both cerebral hemispheres. Bilateral lacunar type infarcts in the basal ganglia superiorly. No acute intracranial hemorrhage mass lesion, infarction, shift, herniation, hydrocephalus, extra-axial fluid collection. No focal mass effect or edema. Cisterns are patent. Cerebellar atrophy as well. Vascular: Intracranial atherosclerosis.  No hyperdense vessel. Skull: Normal. Negative for fracture or focal lesion. Sinuses/Orbits: No acute finding. Other: None. IMPRESSION: Chronic atrophy and white matter microvascular changes. No acute intracranial abnormality by noncontrast CT. Electronically Signed   By: Jerilynn Mages.  Shick M.D.   On: 11/29/2018 12:41   Ct Thoracic Spine Wo Contrast  Result Date: 11/29/2018 CLINICAL DATA:  Generalized weakness beginning at 7:30 a.m. today. The patient is now unable to move or walk. Significant lower extremity weakness and decreased tone. EXAM: CT THORACIC SPINE WITHOUT CONTRAST TECHNIQUE: Multidetector CT images of the thoracic were obtained using the standard protocol without intravenous contrast. COMPARISON:  CT of the chest 06/14/2017 FINDINGS: Alignment: Exaggerated thoracic kyphosis is again noted. There is no significant listhesis or curvature. Vertebrae: Vertebral body heights are maintained. Mild marrow heterogeneity is similar the prior study. Fused anterior osteophytes are present.  Paraspinal and  other soft tissues: Dense atherosclerotic calcifications are present in the aorta. Calcified granuloma is present in the right upper lobe on image 34 series 4. Visualized lung fields are otherwise clear. No significant effusion or pneumothorax is present. Visualized upper abdomen is unremarkable. Pacemaker wires are present. Aortic valve replacement is noted. Extensive coronary artery calcifications are present. Disc levels: No significant thoracic disc disease or central canal stenosis is present to explain new onset of lower extremity weakness. Foramina are patent. Right foraminal narrowing is present at C7-T1. IMPRESSION: 1. Aortic Atherosclerosis (ICD10-I70.0). Given the degree of atherosclerotic disease, cord ischemia should be considered. 2. Aortic valve replacement. 3. Pacemaker wires preclude MRI for evaluation of the spinal cord. 4. Exaggerated thoracic kyphosis and degenerative changes without acute abnormality of the thoracic spine. 5. Fused anterior osteophytes compatible with DISH. Electronically Signed   By: San Morelle M.D.   On: 11/29/2018 12:41   Ct Lumbar Spine Wo Contrast  Result Date: 11/29/2018 CLINICAL DATA:  Back pain with rapidly progressing neurologic deficit. Generalized weakness beginning at 7:30 a.m. The patient states he was able to move normally at 6 a.m. and is now unable to move or walk. EXAM: CT LUMBAR SPINE WITHOUT CONTRAST TECHNIQUE: Multidetector CT imaging of the lumbar spine was performed without intravenous contrast administration. Multiplanar CT image reconstructions were also generated. COMPARISON:  CT of the abdomen and pelvis 05/28/2017 FINDINGS: Segmentation: 5 non rib-bearing lumbar type vertebral bodies are present. The lowest fully formed vertebral body is L5. Alignment: Normal lumbar lordosis is present. No significant listhesis or scoliosis is present Vertebrae: Vertebral body heights are normal. No focal lytic or blastic lesions are present. Paraspinal  and other soft tissues: Atherosclerotic calcifications are present in the aorta without aneurysm. Dense calcifications are present at the ostia of the celiac artery, superior mesenteric artery, and left greater than right renal arteries. Extensive diverticular changes are present in the visualized sigmoid colon without associated inflammatory change. Disc levels: L1-2: Negative. L2-3: Negative. L3-4: A shallow rightward disc protrusion is present. Asymmetric moderate right-sided facet hypertrophy is noted. Mild right foraminal narrowing is present. L4-5: A broad-based disc protrusion is present. Advanced facet hypertrophy is noted. This results in mild central and bilateral foraminal stenosis, right greater than left. L5-S1: A mild broad-based disc protrusion is present. Advanced facet hypertrophy is worse right than left. Facet spurring contributes to mild bilateral foraminal stenosis. IMPRESSION: 1. No acute or focal abnormality to explain sudden onset of weakness. 2. Extensive atherosclerotic disease. Vascular insult to the spinal cord should be considered. 3. Ostial stenosis involving the superior mesenteric artery and left greater than right renal artery. 4. Spinal stenosis is greatest at L4-5 with mild central and bilateral foraminal narrowing, right greater than left. 5. Facet spurring contributes to mild foraminal narrowing bilaterally at L5-S1. 6. Asymmetric right-sided facet hypertrophy and disc protrusion at L3-4 results in mild right foraminal stenosis. Electronically Signed   By: San Morelle M.D.   On: 11/29/2018 12:29    Procedures Procedures (including critical care time)  Medications Ordered in ED Medications - No data to display   Initial Impression / Assessment and Plan / ED Course  I have reviewed the triage vital signs and the nursing notes.  Pertinent labs & imaging results that were available during my care of the patient were reviewed by me and considered in my medical  decision making (see chart for details). Discussed with Dr. Jobe Igo, radiology who has read CT scans.  He notes  spinal stenosis, spurring, and extensive atherosclerotic disease.     Patient has diffuse atherosclerotic disease.  We discussed MRI.  He cannot clear patient for MRI at this time as he had he has had pacemaker placed in the past several years. Discussed with Dr Lorraine Lax who has seen in consult.  Advises MRI after patient's device evaluated for compatibility Admission discussed with Dr. Waldron Labs who will see for admission     CMET delayed. Reviewed 3:40 PM  Hyponatremia-138 (05/23/18)<127(8/7)> 125(today)  Hyperkalemia- 5.4-paced rhythm,difficult to assess if ekg changes- will discuss with Dr.Elgergawy and determinr if intervention needed.i Renal failure- stable creatinine  Final Clinical Impressions(s) / ED Diagnoses   Final diagnoses:  Weakness of extremity   Bilateral lower extremity weakness Hyponatremia hyperkalemia ED Discharge Orders    None       Pattricia Boss, MD 11/29/18 1537    Pattricia Boss, MD 11/29/18 [phone]

## 2018-11-29 NOTE — Progress Notes (Signed)
Patient is in bed with no complains at this time. Bed alarm is on with patient consent.

## 2018-11-30 DIAGNOSIS — I420 Dilated cardiomyopathy: Secondary | ICD-10-CM

## 2018-11-30 DIAGNOSIS — R262 Difficulty in walking, not elsewhere classified: Secondary | ICD-10-CM

## 2018-11-30 DIAGNOSIS — Z7901 Long term (current) use of anticoagulants: Secondary | ICD-10-CM

## 2018-11-30 LAB — BASIC METABOLIC PANEL
Anion gap: 13 (ref 5–15)
BUN: 34 mg/dL — ABNORMAL HIGH (ref 8–23)
CO2: 20 mmol/L — ABNORMAL LOW (ref 22–32)
Calcium: 8.9 mg/dL (ref 8.9–10.3)
Chloride: 96 mmol/L — ABNORMAL LOW (ref 98–111)
Creatinine, Ser: 2.41 mg/dL — ABNORMAL HIGH (ref 0.61–1.24)
GFR calc Af Amer: 28 mL/min — ABNORMAL LOW (ref >60)
GFR calc non Af Amer: 24 mL/min — ABNORMAL LOW (ref >60)
Glucose, Bld: 155 mg/dL — ABNORMAL HIGH (ref 70–99)
Potassium: 4.7 mmol/L (ref 3.5–5.1)
Sodium: 129 mmol/L — ABNORMAL LOW (ref 135–145)

## 2018-11-30 LAB — CK: Total CK: 49 U/L (ref 49–397)

## 2018-11-30 LAB — CBC
HCT: 41.3 % (ref 39.0–52.0)
Hemoglobin: 14.2 g/dL (ref 13.0–17.0)
MCH: 30.7 pg (ref 26.0–34.0)
MCHC: 34.4 g/dL (ref 30.0–36.0)
MCV: 89.2 fL (ref 80.0–100.0)
Platelets: 261 10*3/uL (ref 150–400)
RBC: 4.63 MIL/uL (ref 4.22–5.81)
RDW: 12.8 % (ref 11.5–15.5)
WBC: 8 10*3/uL (ref 4.0–10.5)
nRBC: 0 % (ref 0.0–0.2)

## 2018-11-30 LAB — NOVEL CORONAVIRUS, NAA (HOSP ORDER, SEND-OUT TO REF LAB; TAT 18-24 HRS): SARS-CoV-2, NAA: NOT DETECTED

## 2018-11-30 NOTE — Progress Notes (Signed)
PROGRESS NOTE  [name] LPF:[id number] DOB: 1932/10/25 DOA: 11/29/2018 PCP: [name], MD  Brief History   [name]  is a 85 y.o. male, with history of hypertension, CAD status post CABG and pericardial tissue AVR 5329, chronic systolic HF ejection fraction 25-30% s/p CRT-D, PAF (on Eliquis) , history of V. tach followed by EP Dr. Terese Door on mexiletine, current pleural effusion in the past required pleural drain, patient presents to ED secondary to complaints of lower extremity weakness.  Patient reports generalized weakness that began before 48 hours, but at baseline he ambulates with no assistance, started to use his walker before couple days, but he is took Phenergan recently for nausea, he denies any fever, chills, headache, chest pain, shortness of breath, diarrhea, report after Phenergan was very sleepy, reports that it knocked him out for 12 hours, report this morning he could not get up from chair, and fell backward, injury to back or head, ports he had these episodes happened in the past, he was volume overloaded and it did improve with physical therapy.  In the ED the patient was noted to have weakness in lower extremity bilaterally, CT head with no acute findings, CT thoracic/lumbar spine significant for extensive atherosclerotic disease, significant for spinal stenosis at L4-5 with mild central and bilateral foraminal narrowing, patient was seen by neurology service. They have recommended neurosurgical evaluation as the patient is not a candidate for MRI due to pacemaker, and not a candidate for myelogram or angiogram due to his elevated creatinine. Neurology has signed off.   I have discussed the patient with Dr. Saintclair Halsted of neurosurgery. He states that the patient is now moving his lower extremities and that neurosurgery has no role to play in this patient's treatment. They recommend PT/OT.  Consultants  . Neurology . Neurosurgery  Procedures  . None  Antibiotics    Anti-infectives (From admission, onward)   None    .   Subjective  The patient is lying quietly in bed. He states that he cannot move his legs or stand on them. No new complaints.  Objective   Vitals:  Vitals:   11/30/18 0740 11/30/18 1119  BP: (!) 147/72 113/69  Pulse: 63 66  Resp: 18 16  Temp: 98.5 F (36.9 C) (!) 97.5 F (36.4 C)  SpO2: 97% 98%    Exam:  Constitutional:  . The patient is awake, alert, and oriented x 3. No acute distress. Respiratory:  . No increased work of breathing. . No wheezes, rales, or rhonchi . No tactile fremitus. Cardiovascular:  . Regular rate and rhythm . No murmurs, ectopy, or gallups. . No lateral PMI. No thrills. Abdomen:  . Abdomen  Is soft, non-tender, non-distended . No hernias, masses, or organomegaly . Normoactive bowel sounds. Musculoskeletal:  . No cyanosis, clubbing or edema Skin:  . No rashes, lesions, ulcers . palpation of skin: no induration or nodules Neurologic:  . No movement in legs on command. No movement with babinski. . Sensation all 4 extremities intact Psychiatric:  . Mental status o Mood, affect appropriate o Orientation to person, place, time  . judgment and insight appear intact   I have personally reviewed the following:   Today's Data  . Vitals, BMP. CBC  Imaging  . CT of the thoraco-lumbar spine  Scheduled Meds: . apixaban  2.5 mg Oral BID  . atorvastatin  20 mg Oral q1800  . carvedilol  25 mg Oral BID  . famotidine  40 mg Oral Daily  .  furosemide  40 mg Oral BID  . hydrALAZINE  12.5 mg Oral BID  . isosorbide mononitrate  30 mg Oral Daily  . mexiletine  300 mg Oral TID  . omega-3 acid ethyl esters  1 g Oral BID  . pantoprazole  40 mg Oral Daily  . ranolazine  1,000 mg Oral BID   Continuous Infusions:  Active Problems:   Coronary artery disease   Chronic anticoagulation   CKD (chronic kidney disease) stage 3, GFR 30-59 ml/min (HCC)   Chronic combined systolic and diastolic heart  failure (HCC)   DCM (dilated cardiomyopathy) (HCC)   Lower extremity weakness   LOS: 1 day   A & P  Bilateral lower extremity weakness: I have discussed the results of the patient's CT thoracic and lumbar spine with radiology. They reiterated concern for possible vascular insult to the spinal chord. The patient is not a candidate for MRI due to his pacemaker, or for myelogram due to his elevated creatinine. I consulted neurosurgery. They do not feel that this represents a neurosurgical issue. They see no evidence of vascular compromise. As a matter of fact the patient was able to move his legs on their examination. The patient is receiving steroids for presumed weakness due to stenosis. He is also being evaluated and treated by PT/OT.  Chronic systolic/diastolic CHF, status post AICD: EF on echo from 05/27/2017 demonstrated EF of 20-25%. Stable, chronic. Pt is compensated. I will continue the patient's imdue, coreg, hydralazine, and imdur. He is not able to take ACE I, ARB, or ARNI due to his CKD.  Atrial fibrillation, paroxysmal: EKG demonstrates a junctional rhythm. He has undergone DCCV in 2/20. He will be continued on coreg, mexiletine, and eliquis.  -This postDCCV and 2/20, continue with Coreg, mexiletine and Eliquis\  History of V. Tach: The patient is followed by EP Dr. Terese Door. He is on mexiletine and Coreg. The patient is currently rate controlled.   CKD stage III: Follow on CMP.  History of CAD: No ischemic symptoms. He denies any chest pain or shortness of breath  I have seen and examined this patient myself. I have spent 48 minutes in his evaluation and care.  DVT Prophylaxis Eliquis SCDs CODE STATUS: Full Family Communication: Admission, patients condition and plan of care including tests being ordered have been discussed with the patient  who indicate understanding and agree with the plan and Code Status. Disposition: tbd.  Stephaniemarie Stoffel, DO [facility] Direct  contact: see [email or web address]  7PM-7AM contact night coverage as above 11/30/2018, 2:45 PM  LOS: 1 day

## 2018-11-30 NOTE — Progress Notes (Signed)
Plan to consult for inpatient rehab per MD order.

## 2018-11-30 NOTE — Evaluation (Signed)
Physical Therapy Evaluation Patient Details Name: [name] MRN: [id number] DOB: 24-Apr-1932 Today's Date: 11/30/2018   History of Present Illness  Levander Katzenstein  is a 85 y.o. male, with history of hypertension, CAD status post CABG and pericardial tissue AVR 4503, chronic systolic HF ejection fraction 25-30% s/p CRT-D, PAF (on Eliquis) , history of V. tach followed by EP Dr. Terese Door on mexiletine, current pleural effusion in the past required pleural drain, patient presents to ED secondary to complaints of lower extremity weakness.  Patient reports generalized weakness that began before 48 hours, but at baseline he ambulates with no assistance, started to use his walker before couple days leading up to this admission  Clinical Impression   Pt admitted with above diagnosis. Walking independently prior to this admission; had started using RW a few days prior because of weakness; Presents with bil LE weakness, R quad slightly weaker than L, leading to significantly decr functional mobility; Notably incr fall risk; Tells me this weakness has happened before, and  With rehab he made gains back to independence; Worth considering post-acute rehab; Will place CIR screen; With decr caregiver support, my need to consider SNF; Pt currently with functional limitations due to the deficits listed below (see PT Problem List). Pt will benefit from skilled PT to increase their independence and safety with mobility to allow discharge to the venue listed below.       Follow Up Recommendations CIR;Other (comment)(post-acute rehabilitation)    Equipment Recommendations  Rolling walker with 5" wheels;3in1 (PT)    Recommendations for Other Services       Precautions / Restrictions Precautions Precautions: Fall      Mobility  Bed Mobility               General bed mobility comments: Pt sitting EOB upon PT arrival  Transfers Overall transfer level: Needs assistance Equipment used: Rolling walker (2  wheeled) Transfers: Sit to/from [facility] Sit to Stand: Mod assist;From elevated surface Stand pivot transfers: Mod assist       General transfer comment: Heavy mod assist to power up and stabiilze as he moved his hands from pushing up from the bed to the RW; Heavy dependence on UEs to push up; 2-3 small steps bed to recliner; Noteworthy that he became less interactive with upright activity, and reported dizziness  Ambulation/Gait                Stairs            Wheelchair Mobility    Modified Rankin (Stroke Patients Only)       Balance Overall balance assessment: Needs assistance Sitting-balance support: Single extremity supported;Bilateral upper extremity supported;No upper extremity supported Sitting balance-Leahy Scale: Fair(approaching Good)       Standing balance-Leahy Scale: Poor Standing balance comment: Dependence on external support; possibly orthostatic                             Pertinent Vitals/Pain Pain Assessment: No/denies pain    Home Living Family/patient expects to be discharged to:: Private residence Living Arrangements: Spouse/significant other Available Help at Discharge: Other (Comment)(He helps his wife) Type of Home: House Home Access: Ramped entrance;Stairs to enter Entrance Stairs-Rails: Right Entrance Stairs-Number of Steps: ("one half step") Home Layout: One level Home Equipment: Walker - 2 wheels;Cane - quad;Grab bars - toilet;Grab bars - tub/shower      Prior Function Level of Independence: Independent with assistive device(s)  Comments: Typically walks without an assistive device; gardens     Hand Dominance        Extremity/Trunk Assessment   Upper Extremity Assessment Upper Extremity Assessment: Defer to OT evaluation;Overall WFL for tasks assessed    Lower Extremity Assessment Lower Extremity Assessment: RLE deficits/detail;LLE deficits/detail;Generalized  weakness RLE Deficits / Details: Hip flexor and quad weakness, roughly 3/5 LLE Deficits / Details: Hip flexor and quad weakness, roughly 3/5       Communication   Communication: No difficulties  Cognition Arousal/Alertness: Awake/alert Behavior During Therapy: WFL for tasks assessed/performed Overall Cognitive Status: Within Functional Limits for tasks assessed                                        General Comments General comments (skin integrity, edema, etc.): Reported dizziness in standing and initially in recliner; elevated feet and reclined trunk and the dizziness subsided; BP in reclined position and feet up 143/76, HR 64    Exercises     Assessment/Plan    PT Assessment Patient needs continued PT services  PT Problem List Decreased strength;Decreased activity tolerance;Decreased balance;Decreased mobility;Decreased coordination;Decreased cognition;Decreased knowledge of use of DME;Decreased safety awareness;Decreased knowledge of precautions;Cardiopulmonary status limiting activity       PT Treatment Interventions DME instruction;Gait training;Stair training;Functional mobility training;Therapeutic activities;Therapeutic exercise;Balance training;Patient/family education    PT Goals (Current goals can be found in the Care Plan section)  Acute Rehab PT Goals Patient Stated Goal: Wants to get back to walking independently PT Goal Formulation: With patient Time For Goal Achievement: 12/14/18 Potential to Achieve Goals: Fair    Frequency Min 3X/week   Barriers to discharge Decreased caregiver support Wife unable to provide physical assist    Co-evaluation               AM-PAC PT "6 Clicks" Mobility  Outcome Measure Help needed turning from your back to your side while in a flat bed without using bedrails?: None Help needed moving from lying on your back to sitting on the side of a flat bed without using bedrails?: None Help needed moving to  and from a bed to a chair (including a wheelchair)?: A Lot Help needed standing up from a chair using your arms (e.g., wheelchair or bedside chair)?: A Lot Help needed to walk in hospital room?: Total Help needed climbing 3-5 steps with a railing? : Total 6 Click Score: 14    End of Session Equipment Utilized During Treatment: Gait belt Activity Tolerance: Patient tolerated treatment well;Other (comment)(though possibly orthostatic) Patient left: in chair;with call bell/phone within reach;with chair alarm set;Other (comment)(reclined, feet up) Nurse Communication: Mobility status;Other (comment)(possibly orthostatic) PT Visit Diagnosis: Unsteadiness on feet (R26.81);Other abnormalities of gait and mobility (R26.89);History of falling (Z91.81)    Time: 9021-1155 PT Time Calculation (min) (ACUTE ONLY): 31 min   Charges:   PT Evaluation $PT Eval Moderate Complexity: 1 Mod PT Treatments $Therapeutic Activity: 8-22 mins        Roney Marion, PT  Acute Rehabilitation Services Pager [phone] Office [phone]   Colletta [place] 11/30/2018, 2:04 PM

## 2018-11-30 NOTE — Progress Notes (Signed)
Rehab Admissions Coordinator Note:  Per PT recommendation, this patient was screened by Jhonnie Garner for appropriateness for an Inpatient Acute Rehab Consult.  At this time, we are recommending Inpatient Rehab consult. Unsure if current working diagnosis meets medical criteria for CIR but will request consult order for further assessment.   Jhonnie Garner 11/30/2018, 3:48 PM  I can be reached at [phone].

## 2018-11-30 NOTE — Progress Notes (Signed)
Patient ID: [name], male   DOB: [birth year], 85 y.o.   MRN: [id number] Called in regards to this patient's lower extremity weakness. According to provider he had an acute onset of bilateral lower extremity paraparesis yesterday. CT thoracic and lumbar spine was obtained which showed DISH in thoracic spine and diffuse spondylosis in lumbar spine with atherosclerotic disease. There is no overt compression of the canal in his thoracic or lumbar spine that would suggest this is the etiology of his symptoms. It was reported to me that he was unable to even flicker his toes today. Upon my exam, he actually had decent strength in his lower extremities which was quite different than what was reported: 4-/5 bilat hip flexors, 4-/5 knee extension, 5/5 dorsiflexion, 5/5 plantar flexion. He also just got done working with therapy when I entered the room, whom said he was a moderate assist. My exam seems to be pretty consistent with neurology's exam yesterday. I see no neurosurgical intervention that is warranted at this time. Please follow up with neurology for further assistance.

## 2018-11-30 NOTE — Progress Notes (Signed)
Patient has [employer] ICD [id number], which is not on [employer]'s list of approved devices for MRI making it currently unsafe to perform MRI on this patient

## 2018-11-30 NOTE — Plan of Care (Signed)
  Problem: Education: Goal: Knowledge of General Education information will improve Description: Including pain rating scale, medication(s)/side effects and non-pharmacologic comfort measures Outcome: Progressing

## 2018-12-01 LAB — BASIC METABOLIC PANEL
Anion gap: 12 (ref 5–15)
BUN: 48 mg/dL — ABNORMAL HIGH (ref 8–23)
CO2: 21 mmol/L — ABNORMAL LOW (ref 22–32)
Calcium: 8.4 mg/dL — ABNORMAL LOW (ref 8.9–10.3)
Chloride: 96 mmol/L — ABNORMAL LOW (ref 98–111)
Creatinine, Ser: 2.98 mg/dL — ABNORMAL HIGH (ref 0.61–1.24)
GFR calc Af Amer: 21 mL/min — ABNORMAL LOW (ref >60)
GFR calc non Af Amer: 18 mL/min — ABNORMAL LOW (ref >60)
Glucose, Bld: 123 mg/dL — ABNORMAL HIGH (ref 70–99)
Potassium: 4.6 mmol/L (ref 3.5–5.1)
Sodium: 129 mmol/L — ABNORMAL LOW (ref 135–145)

## 2018-12-01 MED ORDER — SODIUM CHLORIDE 0.9 % IV BOLUS
500.0000 mL | Freq: Once | INTRAVENOUS | Status: AC
  Administered 2018-12-01: 500 mL via INTRAVENOUS

## 2018-12-01 NOTE — Progress Notes (Signed)
Went to discharge home. Pt stated he didn't not feel as well as he did yesterday. Requested to walk to the door, ambulated with pt to the door and pt became dizzy. MD made aware. Orders received to give bolus and discharge home this afternoon upon receiving bolus.

## 2018-12-01 NOTE — Progress Notes (Addendum)
Reason for consult: Bilateral lower extremity weakness of acute onset  Subjective: No acute events overnight.  Patient able to get out of bed and go to the bathroom with minimal assistance.  Feeling much better today, nausea and vomiting resolved.  Working with OT this morning, able to get out of bed to chair with minimal assistance. Noted to be orthostatic from sitting to standing. States that prior to presentation he had been sick with a viral illness for about 1 week which led to progressive, generalized weakness that he was unable to get out of his chair to go to the bathroom. He feels that his strength is improving.  ROS: negative except above  Examination  Vital signs in last 24 hours: Temp:  [97.5 F (36.4 C)-98.2 F (36.8 C)] 98.2 F (36.8 C) (08/24 0525) Pulse Rate:  [59-66] 59 (08/24 0525) Resp:  [16-20] 20 (08/24 0525) BP: (103-167)/(54-80) 138/69 (08/24 0525) SpO2:  [97 %-100 %] 97 % (08/24 0525) Weight:  [89 kg] 89 kg (08/24 0533)  General: Sitting up in chair in breakfast CVS: pulse-normal rate and rhythm RS: breathing comfortably on room air Extremities: normal   Neuro: MS: Alert, oriented, follows commands CN: pupils equal and reactive,  EOMI, face symmetric, tongue midline, normal sensation over face, Motor: 5/5 strength in all 4 extremities. No lower extremity weakness on today's exam. Muscle bulk normal x 4.  Reflexes: 2+ bilaterally over patella, biceps, plantars: flexor Coordination: normal Gait: not tested  Basic Metabolic Panel: Recent Labs  Lab 11/29/18 1034 11/29/18 1619 11/30/18 0454 12/01/18 0530  NA 125* 127* 129* 129*  K 5.4* 4.2 4.7 4.6  CL 93* 93* 96* 96*  CO2 20* 22 20* 21*  GLUCOSE 103* 103* 155* 123*  BUN 29* 28* 34* 48*  CREATININE 2.29* 2.31* 2.41* 2.98*  CALCIUM 8.6* 8.5* 8.9 8.4*    CBC: Recent Labs  Lab 11/29/18 1034 11/30/18 0454  WBC 11.1* 8.0  NEUTROABS 6.9  --   HGB 13.7 14.2  HCT 41.1 41.3  MCV 91.3 89.2  PLT 258  261     Coagulation Studies: No results for input(s): LABPROT, INR in the last 72 hours.  Imaging Reviewed: No new imaging to review.   Assessment/Recommendations: 85 y.o. male with medical history of CAD, CKD, dilated cardiomyopathy with ICD, hypertension, hyperlipidemia, prediabetes, hypothyroidism, protein calorie malnutrition, chronic neuropathy who presented initially with reported acute onset of bilateral LE weakness and decreased sensation, initially concerning for compressive myelopathy vs cauda equina syndrome vs spinal cord infarct. However, CT imaging of head, thoracic and lumbar spine did not reveal any acute pathology explaining patient's symptoms. He also did not have sensory loss or bowel/bladder incontinence, making spinal cord pathology unlikely.  -- On further history today, he reports he had a viral illness a week ago and have been getting progressively weak at home until he was unable to stand up. Was diffusely weak, BLE worse than upper extremities. He feels that his lower extremity weakness was due to the viral illness. He denies ever completely losing strength in his lower extremities, stating that his strength was decreased to the point of not being able to stand up, but still was able to move his BLE when symptoms were at their peak. Also with gradual onset weakness rather than abrupt weakness, per follow up interview with the patient.  -- His neurological exam today is unremarkable and he states he feels close to baseline. This is likely generalized weakness/deconditioning in an elderly patient with a  recent viral infection. In addition, he is also orthostatic, which could be contributing to his symptoms. Recommend continuing supportive care and addressing orthostatic hypotension.   Neurology will sign off at this time, please call us with any questions in the future.   Welford Roche, MD  Internal Medicine PGY-3  P [phone]  Electronically signed: Dr. Kerney Elbe

## 2018-12-01 NOTE — Evaluation (Signed)
Occupational Therapy Evaluation Patient Details Name: [name] MRN: [id number] DOB: 03-15-1933 Today's Date: 12/01/2018    History of Present Illness [name]  is a 85 y.o. male, with history of hypertension, CAD status post CABG and pericardial tissue AVR 9518, chronic systolic HF ejection fraction 25-30% s/p CRT-D, PAF (on Eliquis) , history of V. tach followed by EP Dr. Terese [name] on mexiletine, current pleural effusion in the past required pleural drain, patient presents to ED secondary to complaints of lower extremity weakness.  Patient reports generalized weakness that began before 48 hours, but at baseline he ambulates with no assistance, started to use his walker before couple days leading up to this admission   Clinical Impression   PTA patient independent, assisting with care of his spouse.  Admitted for above and limited by problem list below, including orthostatic hypotension, impaired balance, generalized weakness and decreased activity tolerance. Patient currently requires min assist for transfers using RW, min assist for LB ADLs and setup for UB ADLs seated.  Patient with positive orthostatics during session (see flowsheets) but dropping from 156/75 to 70/48 from supine to standing (unable to tolerate standing for completion of 1st BP reading). Pt in chair upon exit, with BP improved to 93/61 and RN notified.  Believe patient will benefit from intensive rehab prior to dc home in order to maximize independence and safety with ADLs/mobility to return to PLOF. Will follow acutely.      Follow Up Recommendations  Supervision/Assistance - 24 hour;Other (comment)(post acute rehab CIR vs SNF )    Equipment Recommendations  3 in 1 bedside commode    Recommendations for Other Services PT consult     Precautions / Restrictions Precautions Precautions: Fall Precaution Comments: watch BP Restrictions Weight Bearing Restrictions: No      Mobility Bed Mobility Overal bed  mobility: Needs Assistance Bed Mobility: Supine to Sit     Supine to sit: Supervision     General bed mobility comments: increased time with HOB elevated but no assist required  Transfers Overall transfer level: Needs assistance Equipment used: Rolling walker (2 wheeled) Transfers: Sit to/from [facility] Sit to Stand: Min assist Stand pivot transfers: Min assist       General transfer comment: using RW for transfer, min assist to power up and stedy during transfer; relaint on UE support    Balance Overall balance assessment: Needs assistance Sitting-balance support: No upper extremity supported;Feet supported Sitting balance-Leahy Scale: Good     Standing balance support: Bilateral upper extremity supported;During functional activity Standing balance-Leahy Scale: Poor Standing balance comment: relaint on B UE and external support                           ADL either performed or assessed with clinical judgement   ADL Overall ADL's : Needs assistance/impaired     Grooming: Set up;Sitting   Upper Body Bathing: Set up;Sitting   Lower Body Bathing: Sit to/from stand;Minimal assistance   Upper Body Dressing : Set up;Sitting   Lower Body Dressing: Minimal assistance;Sit to/from stand Lower Body Dressing Details (indicate cue type and reason): able to don socks with setup sitting, limited standing tolerance and reliant on BUE support but min A sit<>stand  Toilet Transfer: Minimal assistance;Stand-pivot;RW Toilet Transfer Details (indicate cue type and reason): simulated to reclienr          Functional mobility during ADLs: Minimal assistance;Rolling walker General ADL Comments: pt limited by orthostatic hypotension, decreased  activity tolerance, weakness      Vision   Vision Assessment?: No apparent visual deficits     Perception     Praxis      Pertinent Vitals/Pain Pain Assessment: No/denies pain     Hand Dominance      Extremity/Trunk Assessment Upper Extremity Assessment Upper Extremity Assessment: Generalized weakness   Lower Extremity Assessment Lower Extremity Assessment: Defer to PT evaluation   Cervical / Trunk Assessment Cervical / Trunk Assessment: Normal   Communication Communication Communication: No difficulties   Cognition Arousal/Alertness: Awake/alert Behavior During Therapy: WFL for tasks assessed/performed Overall Cognitive Status: Within Functional Limits for tasks assessed                                     General Comments  pt denies dizzines during mobility, but orthostatic based on vitals (standing vBP limited by tolerance, pt reports feeling "weak" and had to sit before value completed)    Exercises     Shoulder Instructions      Home Living Family/patient expects to be discharged to:: Private residence Living Arrangements: Spouse/significant other Available Help at Discharge: Other (Comment)(he helps his wife) Type of Home: House Home Access: Ramped entrance;Stairs to enter   Entrance Stairs-Rails: Right Home Layout: One level     Bathroom Shower/Tub: Tub/shower unit(but pt sponge bathes only)   Bathroom Toilet: Handicapped height     Home Equipment: Environmental consultant - 2 wheels;Cane - quad;Grab bars - toilet;Grab bars - tub/shower          Prior Functioning/Environment Level of Independence: Independent with assistive device(s)        Comments: Typically walks without an assistive device; gardens        OT Problem List: Decreased strength;Decreased activity tolerance;Impaired balance (sitting and/or standing);Decreased safety awareness;Decreased knowledge of use of DME or AE;Decreased knowledge of precautions      OT Treatment/Interventions: Self-care/ADL training;Therapeutic exercise;Energy conservation;DME and/or AE instruction;Therapeutic activities;Patient/family education;Balance training    OT Goals(Current goals can be found in the  care plan section) Acute Rehab OT Goals Patient Stated Goal: Wants to get back to walking independently OT Goal Formulation: With patient Time For Goal Achievement: 12/15/18 Potential to Achieve Goals: Good  OT Frequency: Min 2X/week   Barriers to D/C:            Co-evaluation              AM-PAC OT "6 Clicks" Daily Activity     Outcome Measure Help from another person eating meals?: None Help from another person taking care of personal grooming?: A Little Help from another person toileting, which includes using toliet, bedpan, or urinal?: A Little Help from another person bathing (including washing, rinsing, drying)?: A Little Help from another person to put on and taking off regular upper body clothing?: A Little Help from another person to put on and taking off regular lower body clothing?: A Little 6 Click Score: 19   End of Session Equipment Utilized During Treatment: Rolling walker Nurse Communication: Mobility status  Activity Tolerance: Patient tolerated treatment well Patient left: in chair;with call bell/phone within reach;with chair alarm set  OT Visit Diagnosis: Unsteadiness on feet (R26.81);Muscle weakness (generalized) (M62.81)                Time: 9562-1308 OT Time Calculation (min): 16 min Charges:  OT General Charges $OT Visit: 1 Visit OT Evaluation $OT Eval Moderate Complexity:  [place], [place] Acute Rehabilitation Services Pager [phone] Office [phone]   Delight Stare 12/01/2018, 8:32 AM

## 2018-12-01 NOTE — Progress Notes (Signed)
Inpatient Rehab Admissions:  Inpatient Rehab Consult received.  I met with patient at the bedside for rehabilitation assessment and to discuss goals and expectations of an inpatient rehab admission.  Note pt currently performing at min assist level, but severely orthostatic with mobility.  Feel that with time and resolution of orthostatic hypotension, pt will be too high level for CIR; however, I will continue to follow for 1-2 more days.  If pt remains in house and does not progress, I will reassess.   Signed: Shann Medal, PT, DPT Admissions Coordinator [phone] 12/01/18  11:42 AM

## 2018-12-01 NOTE — Progress Notes (Signed)
Spoke  with daughter about pt concerns about pt going home. Informed her HHPT would follow and [facility] rehab and PT recommendations.

## 2018-12-01 NOTE — Discharge Summary (Signed)
Physician Discharge Summary  [name] OBS:[id number] DOB: 1932/11/08 DOA: 11/29/2018  PCP: [name], MD  Admit date: 11/29/2018 Discharge date: 12/01/2018  Recommendations for Outpatient Follow-up:  1. Follow up with PCP in 7-10 days. 2. Home health PT.   Follow-up Information    [name], MD. Go on 12/10/2018.   Specialty: Family Medicine Why: _0 :15am Contact information: [place] [id number] [place], [place] Follow up.   Specialty: Home Health Services Why: HHPT arranged- they will call you to set up home visits           Discharge Diagnoses: Principal diagnosis is #1 1. Bilateral lower extremity weakness. 2. Ambulatory dysfunction 3. Dizziness 4. Chronic diastolic/systolic CHF 5. Atrial fibrillation 6. CKD III 7. History of CAD  Discharge Condition: Fair Disposition: Home with home health.  Diet recommendation: Heart healthy  Filed Weights   11/29/18 1537 11/30/18 0454 12/01/18 0533  Weight: 87.8 kg 89.1 kg 89 kg    History of present illness:  [name]  is a 85 y.o. male, with history of hypertension, CAD status post CABG and pericardial tissue AVR 6503, chronic systolic HF ejection fraction 25-30% s/p CRT-D, PAF (on Eliquis) , history of V. tach followed by EP Dr. Terese Door on mexiletine, current pleural effusion in the past required pleural drain, patient presents to ED secondary to complaints of lower extremity weakness.  Patient reports generalized weakness that began before 48 hours, but at baseline he ambulates with no assistance, started to use his walker before couple days, but he is took Phenergan recently for nausea, he denies any fever, chills, headache, chest pain, shortness of breath, diarrhea, report after Phenergan was very sleepy, reports that it knocked him out for 12 hours, report this morning he could not get up from chair, and fell backward, injury to back or head,  ports he had these episodes happened in the past, he was volume overloaded and it did improve with physical therapy. - in ED patient was noted to have weakness in lower extremity bilaterally, CT head with no acute findings, CT thoracic/lumbar spine significant for extensive atherosclerotic disease, significant for spinal stenosis at L4-5 with mild central and bilateral foraminal narrowing, patient was seen by neurology service, there was no concern for cord compression, or infarction, and I was called for admission  Hospital Course:  In the [place] patient was noted to have weakness in lower extremity bilaterally, CT head with no acute findings, CT thoracic/lumbar spine significant for extensive atherosclerotic disease, significant for spinal stenosis at L4-5 with mild central and bilateral foraminal narrowing,patient was seen by neurology service. They have recommended neurosurgical evaluation as the patient is not a candidate for MRI due to pacemaker, and not a candidate for myelogram or angiogram due to his elevated creatinine. Neurology has signed off.   I have discussed the patient with Dr. Saintclair Halsted of neurosurgery. He states that the patient is now moving his lower extremities and that neurosurgery has no role to play in this patient's treatment. They recommend PT/OT. The patient has done well with PT/OT. Although they recommended inpatient rehab, the patient has decided that he wanted to go home with home health PT instead. However, this afternoon he claimed to be dizzy and the patient's nurse advised me that she thought that the patient was orthostatic. He will be given a 500cc bolus of NS. He will then be able to discharge to home today.  Today's assessment: S: The patient is resting comfortably. No new complaints. O: Vitals:  Vitals:   12/01/18 0935 12/01/18 1140  BP: (!) 114/50 131/62  Pulse:  63  Resp:  16  Temp:  98.8 F (37.1 C)  SpO2:  99%    Constitutional:  The patient is awake,  alert, and oriented x 3. No acute distress. Respiratory:   There is no increased work of breathing.  No wheezes, rales, or rhonchi.   No tactile fremitus. Cardiovascular:   Regular rate and rhythm.  No murmurs, ectopy, or gallups.  No lateral PMI. No thrills. Abdomen:   Abdomen is soft, non-tender, non-distended.  No hernias, masses, or organomegaly.  Normoacitve bowel sounds.  Musculoskeletal:   No cyanosis, clubbing, or edema. Skin:   No rashes, lesions, ulcers  palpation of skin: no induration or nodules Neurologic:   CN 2-12 intact  Sensation all 4 extremities intact Psychiatric:  o No cyanosis, clubbing, or edema. Discharge Instructions  Discharge Instructions    Activity as tolerated - No restrictions   Complete by: As directed    Call MD for:  persistant dizziness or light-headedness   Complete by: As directed    Diet - low sodium heart healthy   Complete by: As directed    Discharge instructions   Complete by: As directed    Patient will follow up with PCP as outpatient in 7-10 days. No driving.   Increase activity slowly   Complete by: As directed      Allergies as of 12/01/2018      Reactions   Novocain [procaine] Swelling   SWELLING REACTION UNSPECIFIED    Amiodarone Nausea And Vomiting      Medication List    TAKE these medications   acetaminophen 325 MG tablet Commonly known as: TYLENOL Take 650 mg by mouth every 6 (six) hours as needed for mild pain.   albuterol 108 (90 Base) MCG/ACT inhaler Commonly known as: VENTOLIN HFA Inhale 1-2 puffs into the lungs every 6 (six) hours as needed for wheezing.   atorvastatin 20 MG tablet Commonly known as: LIPITOR TAKE 1 TABLET EVERY DAY  AT  6  PM What changed: See the new instructions.   carvedilol 25 MG tablet Commonly known as: COREG Take 1 tablet (25 mg total) by mouth 2 (two) times daily.   Eliquis 2.5 MG Tabs tablet Generic drug: apixaban TAKE 1 TABLET TWICE DAILY What  changed: how much to take   famotidine 40 MG tablet Commonly known as: PEPCID Take 40 mg by mouth daily.   Fish Oil 1000 MG Caps Take 2,000 mg by mouth 2 (two) times daily.   furosemide 40 MG tablet Commonly known as: LASIX TAKE 1 TABLET TWICE DAILY What changed:   how much to take  how to take this  when to take this  additional instructions   hydrALAZINE 25 MG tablet Commonly known as: APRESOLINE TAKE 1/2 TABLET (12.5MG) BY MOUTH 2 (TWO) TIMES DAILY BEFORE MEALS What changed: See the new instructions.   isosorbide mononitrate 30 MG 24 hr tablet Commonly known as: IMDUR TAKE 1 TABLET EVERY DAY   mexiletine 150 MG capsule Commonly known as: MEXITIL Take 2 capsules (300 mg total) by mouth 3 (three) times daily.   multivitamin with minerals Tabs tablet Take 1 tablet by mouth daily.   pantoprazole 40 MG tablet Commonly known as: PROTONIX Take 1 tablet by mouth daily.   potassium chloride SA 20 MEQ tablet Commonly known as: K-DUR Take  1 tablet (20 mEq total) by mouth daily. What changed: when to take this   promethazine 25 MG tablet Commonly known as: PHENERGAN Take 25 mg by mouth every 8 (eight) hours.   ranolazine 1000 MG SR tablet Commonly known as: RANEXA Take 1 tablet (1,000 mg total) by mouth 2 (two) times daily.   traMADol 50 MG tablet Commonly known as: ULTRAM Take 1 tablet (50 mg total) by mouth every 12 (twelve) hours as needed for moderate pain or severe pain.            Durable Medical Equipment  (From admission, onward)         Start     Ordered   12/01/18 1123  DME 3-in-1  Once     12/01/18 1124   12/01/18 1122  For home use only DME Walker rolling  [facility])  Once    Comments: 5" wheels  Question:  Patient needs a walker to treat with the following condition  Answer:  Ambulatory dysfunction   12/01/18 1124         Allergies  Allergen Reactions   Novocain [Procaine] Swelling    SWELLING REACTION UNSPECIFIED    Amiodarone  Nausea And Vomiting    The results of significant diagnostics from this hospitalization (including imaging, microbiology, ancillary and laboratory) are listed below for reference.    Significant Diagnostic Studies: Ct Head Wo Contrast  Result Date: 11/29/2018 CLINICAL DATA:  Generalized weakness EXAM: CT HEAD WITHOUT CONTRAST TECHNIQUE: Contiguous axial images were obtained from the base of the skull through the vertex without intravenous contrast. COMPARISON:  None. FINDINGS: Brain: Moderate brain atrophy pattern and chronic microvascular changes throughout both cerebral hemispheres. Bilateral lacunar type infarcts in the basal ganglia superiorly. No acute intracranial hemorrhage mass lesion, infarction, shift, herniation, hydrocephalus, extra-axial fluid collection. No focal mass effect or edema. Cisterns are patent. Cerebellar atrophy as well. Vascular: Intracranial atherosclerosis.  No hyperdense vessel. Skull: Normal. Negative for fracture or focal lesion. Sinuses/Orbits: No acute finding. Other: None. IMPRESSION: Chronic atrophy and white matter microvascular changes. No acute intracranial abnormality by noncontrast CT. Electronically Signed   By: Jerilynn Mages.  Shick M.D.   On: 11/29/2018 12:41   Ct Thoracic Spine Wo Contrast  Result Date: 11/29/2018 CLINICAL DATA:  Generalized weakness beginning at 7:30 a.m. today. The patient is now unable to move or walk. Significant lower extremity weakness and decreased tone. EXAM: CT THORACIC SPINE WITHOUT CONTRAST TECHNIQUE: Multidetector CT images of the thoracic were obtained using the standard protocol without intravenous contrast. COMPARISON:  CT of the chest 06/14/2017 FINDINGS: Alignment: Exaggerated thoracic kyphosis is again noted. There is no significant listhesis or curvature. Vertebrae: Vertebral body heights are maintained. Mild marrow heterogeneity is similar the prior study. Fused anterior osteophytes are present. Paraspinal and other soft tissues: Dense  atherosclerotic calcifications are present in the aorta. Calcified granuloma is present in the right upper lobe on image 34 series 4. Visualized lung fields are otherwise clear. No significant effusion or pneumothorax is present. Visualized upper abdomen is unremarkable. Pacemaker wires are present. Aortic valve replacement is noted. Extensive coronary artery calcifications are present. Disc levels: No significant thoracic disc disease or central canal stenosis is present to explain new onset of lower extremity weakness. Foramina are patent. Right foraminal narrowing is present at C7-T1. IMPRESSION: 1. Aortic Atherosclerosis (ICD10-I70.0). Given the degree of atherosclerotic disease, cord ischemia should be considered. 2. Aortic valve replacement. 3. Pacemaker wires preclude MRI for evaluation of the spinal cord. 4. Exaggerated thoracic  kyphosis and degenerative changes without acute abnormality of the thoracic spine. 5. Fused anterior osteophytes compatible with DISH. Electronically Signed   By: San Morelle M.D.   On: 11/29/2018 12:41   Ct Lumbar Spine Wo Contrast  Result Date: 11/29/2018 CLINICAL DATA:  Back pain with rapidly progressing neurologic deficit. Generalized weakness beginning at 7:30 a.m. The patient states he was able to move normally at 6 a.m. and is now unable to move or walk. EXAM: CT LUMBAR SPINE WITHOUT CONTRAST TECHNIQUE: Multidetector CT imaging of the lumbar spine was performed without intravenous contrast administration. Multiplanar CT image reconstructions were also generated. COMPARISON:  CT of the abdomen and pelvis 05/28/2017 FINDINGS: Segmentation: 5 non rib-bearing lumbar type vertebral bodies are present. The lowest fully formed vertebral body is L5. Alignment: Normal lumbar lordosis is present. No significant listhesis or scoliosis is present Vertebrae: Vertebral body heights are normal. No focal lytic or blastic lesions are present. Paraspinal and other soft tissues:  Atherosclerotic calcifications are present in the aorta without aneurysm. Dense calcifications are present at the ostia of the celiac artery, superior mesenteric artery, and left greater than right renal arteries. Extensive diverticular changes are present in the visualized sigmoid colon without associated inflammatory change. Disc levels: L1-2: Negative. L2-3: Negative. L3-4: A shallow rightward disc protrusion is present. Asymmetric moderate right-sided facet hypertrophy is noted. Mild right foraminal narrowing is present. L4-5: A broad-based disc protrusion is present. Advanced facet hypertrophy is noted. This results in mild central and bilateral foraminal stenosis, right greater than left. L5-S1: A mild broad-based disc protrusion is present. Advanced facet hypertrophy is worse right than left. Facet spurring contributes to mild bilateral foraminal stenosis. IMPRESSION: 1. No acute or focal abnormality to explain sudden onset of weakness. 2. Extensive atherosclerotic disease. Vascular insult to the spinal cord should be considered. 3. Ostial stenosis involving the superior mesenteric artery and left greater than right renal artery. 4. Spinal stenosis is greatest at L4-5 with mild central and bilateral foraminal narrowing, right greater than left. 5. Facet spurring contributes to mild foraminal narrowing bilaterally at L5-S1. 6. Asymmetric right-sided facet hypertrophy and disc protrusion at L3-4 results in mild right foraminal stenosis. Electronically Signed   By: San Morelle M.D.   On: 11/29/2018 12:29    Microbiology: Recent Results (from the past 240 hour(s))  Novel Coronavirus, NAA (hospital order; send-out to ref lab)     Status: None   Collection Time: 11/29/18  2:44 PM  Result Value Ref Range Status   SARS-CoV-2, NAA NOT DETECTED NOT DETECTED Final    Comment: (NOTE) This test was developed and its performance characteristics determined by [employer]. This test has not been  FDA cleared or approved. This test has been authorized by FDA under an Emergency Use Authorization (EUA). This test is only authorized for the duration of time the declaration that circumstances exist justifying the authorization of the emergency use of in vitro diagnostic tests for detection of SARS-CoV-2 virus and/or diagnosis of COVID-19 infection under section 564(b)(1) of the Act, 21 U.S.C. 574BBU-0(Z)(7), unless the authorization is terminated or revoked sooner. When diagnostic testing is negative, the possibility of a false negative result should be considered in the context of a patient's recent exposures and the presence of clinical signs and symptoms consistent with COVID-19. An individual without symptoms of COVID-19 and who is not shedding SARS-CoV-2 virus would expect to have a negative (not detected) result in this assay. Performed  At: [facility] [place],  [place] [id number] Rush Farmer MD XN:[id number]    Coronavirus Source NASOPHARYNGEAL  Final    Comment: Performed at [facility], [place] [address] [place], [place] [address]     Labs: Basic Metabolic Panel: Recent Labs  Lab 11/29/18 1034 11/29/18 1619 11/30/18 0454 12/01/18 0530  NA 125* 127* 129* 129*  K 5.4* 4.2 4.7 4.6  CL 93* 93* 96* 96*  CO2 20* 22 20* 21*  GLUCOSE 103* 103* 155* 123*  BUN 29* 28* 34* 48*  CREATININE 2.29* 2.31* 2.41* 2.98*  CALCIUM 8.6* 8.5* 8.9 8.4*   Liver Function Tests: Recent Labs  Lab 11/29/18 1034  AST 35  ALT 24  ALKPHOS 45  BILITOT 1.1  PROT 5.8*  ALBUMIN 3.5   No results for input(s): LIPASE, AMYLASE in the last 168 hours. No results for input(s): AMMONIA in the last 168 hours. CBC: Recent Labs  Lab 11/29/18 1034 11/30/18 0454  WBC 11.1* 8.0  NEUTROABS 6.9  --   HGB 13.7 14.2  HCT 41.1 41.3  MCV 91.3 89.2  PLT 258 261   Cardiac Enzymes: Recent Labs  Lab 11/30/18 2242  CKTOTAL 49   BNP: BNP (last 3 results) Recent  Labs    11/14/18 1523  BNP 313.2*    ProBNP (last 3 results) No results for input(s): PROBNP in the last 8760 hours.  CBG: No results for input(s): GLUCAP in the last 168 hours.  Active Problems:   Coronary artery disease   Chronic anticoagulation   CKD (chronic kidney disease) stage 3, GFR 30-59 ml/min (HCC)   Chronic combined systolic and diastolic heart failure (HCC)   DCM (dilated cardiomyopathy) (HCC)   Lower extremity weakness   Time coordinating discharge: 38 minutes.  Signed:        [name] Memoli, DO [facility]  12/01/2018, 4:22 PM

## 2018-12-01 NOTE — Consult Note (Signed)
   [facility] Inpatient Consult   12/01/2018  [name] 07-Jul-1932 [id number]    Patient was screened for potential needs of [facility] Care Management services under his [facility] plan with 18% risk score for unplanned readmission and hospitalization.  Patient was engaged by [facility] community care Nurse, children's for management of HF in the past.  Per chart review and history & physical on 11/29/18 reveals as follows:  [name] a85 y.o.male,with history of hypertension, CAD status post CABG and pericardial tissue AVR 9102, chronic systolic HF ejection fraction 25-30% s/p CRT-D, PAF (on Eliquis),history of V. tach followed by EP Dr. Terese Door on mexiletine,current pleural effusion in the past required pleural drain,   presented to ED secondary to complaints of lower extremity weakness.Patient reports generalized weakness that began before 48 hours, but at baseline he ambulates with no assistance. Consulted Neurosurgery but do not feel that this represents a neurosurgical issue. They see no evidence of vascular compromise. Patient is receiving steroids for presumed weakness due to stenosis. He is also being evaluated and treated by PT/OT.  Patient is being recommended by PT/ OT to Inpatient Rehab (CIR) vs. SNF (skilled nursing facility).   Primary care provider is Dr. [name] with [place] [facility], listed to provide transition of care follow-up.   Will follow for disposition, and if there are changes with patient's post hospital needs and disposition, please place a [place] Management consult for community follow-up as appropriate.   Of note, [facility] Care Management services does not replace or interfere with any services that are arranged by transition of care case management or social work.    For questions and referral, please contact:  Moani Weipert A. Daymond Cordts, BSN, RN-BC  [facility] Liaison  Cell: [phone]

## 2018-12-01 NOTE — TOC Transition Note (Signed)
Transition of Care [facility]) - CM/SW Discharge Note Marvetta Gibbons RN,BSN Transitions of Care Unit 3E Coverage - RN Case Manager [phone]   Patient Details  Name: [name] MRN: [id number] Date of Birth: [birth year]  Transition of Care [facility]) CM/SW Contact:  Dawayne Patricia, RN Phone Number: 12/01/2018, 2:02 PM   Clinical Narrative:    Pt stable for transition home today- pt declines CIR consult and prefers to [facility]. Orders placed for HHPT and DME. CM spoke with pt at bedside, pt reports that he has a rollator and BSC at home, declines RW or any other DME needs. Pt reports he will have transport home. Choice offered for [facility] needs- list provided Per CMS guidelines from medicare.gov website with star ratings (copy placed in shadow chart) pt states he has used [facility] in past would like to use them again. Call made to [facility] with [facility] for [facility] referral- referral has been accepted for HHPT.    Final next level of care: [place] Barriers to Discharge: No Barriers Identified   Patient Goals and CMS Choice Patient states their goals for this hospitalization and ongoing recovery are:: "to get back home" CMS Medicare.gov Compare Post Acute Care list provided to:: Patient Choice offered to / list presented to : Patient  Discharge Placement  Home with [facility]                     Discharge Plan and Services   Discharge Planning Services: CM Consult Post Acute Care Choice: Home Health, Durable Medical Equipment          DME Arranged: N/A         HH Arranged: PT [place] Agency: [place] ([place]) Date HH Agency Contacted: 12/01/18 Time [place]: 1244 Representative spoke with at [place]: [place] ([place]) Interventions     Readmission Risk Interventions Readmission Risk Prevention Plan 12/01/2018  Transportation Screening Complete  PCP or Specialist Appt within 5-7 Days Complete  Home Care  Screening Complete  Medication Review (RN CM) Complete  Some recent data might be hidden

## 2018-12-01 NOTE — Consult Note (Signed)
   [facility] Inpatient Consult   12/01/2018  [name] Aug 19, 1932 [id number]    Follow-up Note:  Noted change in patient's disposition per transition of care CM note, stating that patient declines CIR consult and prefers to return home with [facility] ([facility]).  Called patient and spokewithhim and daughter Water quality scientist thehospital room phone. HIPAA verification obtained. Patient reports that he is discharging home today with home health services ([facility]). Patient reports that he lives with wife, but son [name]) is there everyday to assist with their care needs. Patientindicated nothaving current needsor barrierswith medications (self managing with son/ daughter's assistance); pharmacy (using [facility]); and transportation being provided by his son (appointments scheduled when son is off). Explained to daughter regarding [place] transportation benefits if in case needed.  Daughter reports that patient is aware to check weight and record daily, as well as to report abnormal results to provider. Daughter verbalized that patient tries to follow diet restrictions, with DM under good control and is aware of HF signs/ symptoms reportable to provider.   She endorsespatient's primary care provider as Dr. [name] with [place] at [facility], listed to provide transition of care follow-up.  Patient's daughter agreed he could benefitfromEMMI General calls tofollow-upwithhisrecoveryafterdischargetohome.  Of note, [facility] Care Management services does not replace or interfere with any services that are arranged by transition of care case management or social work.    For questions and additional information, please contact:  Law Corsino A. Kealohilani Maiorino, BSN, RN-BC [facility] Liaison Cell: [phone]

## 2018-12-01 NOTE — Progress Notes (Signed)
Nutrition Brief Note  Patient identified on the Malnutrition Screening Tool (MST) Report  Wt Readings from Last 15 Encounters:  12/01/18 89 kg  11/14/18 90.4 kg  08/01/18 86.2 kg  05/28/18 85.7 kg  05/23/18 88 kg  02/04/18 83.8 kg  01/07/18 84 kg  12/17/17 84.1 kg  11/04/17 84.4 kg  10/18/17 83.9 kg  09/23/17 81.9 kg  09/13/17 77.1 kg  09/11/17 77.1 kg  09/11/17 77.1 kg  08/22/17 78 kg   [name]  is a 85 y.o. male, with history of hypertension, CAD status post CABG and pericardial tissue AVR 0865, chronic systolic HF ejection fraction 25-30% s/p CRT-D, PAF (on Eliquis) , history of V. tach followed by EP Dr. Terese Door on mexiletine, current pleural effusion in the past required pleural drain, patient presents to ED secondary to complaints of lower extremity weakness.  Patient reports generalized weakness that began before 48 hours, but at baseline he ambulates with no assistance, started to use his walker before couple days, but he is took Phenergan recently for nausea, he denies any fever, chills, headache, chest pain, shortness of breath, diarrhea, report after Phenergan was very sleepy, reports that it knocked him out for 12 hours, report this morning he could not get up from chair, and fell backward, injury to back or head, ports he had these episodes happened in the past, he was volume overloaded and it did improve with physical therapy.  Pt admitted with bilateral lower extremity weakness.   Reviewed I/O's: -1.3 L x 24 hours and -2.2 L since admission  UOP: 2.1 L x 24 hours  Spoke with pt at bedside, who was pleasant and in good spirits today. He reports he is going home (noted discharge orders placed after RD visit). Pt reports improved appetite since hospitalization earlier this year. He typically consumes 3 meals per day (Breakfast: corn flakes, Lunch and Dinner: meat, starch, and vegetable). He reports he and his wife prepare large quantities of food over the weekend, which  they eat throughout the week.   Pt estimated he consumed about 80% of his breakfast. Meal completion 50-100%. Noted wt has been stable over the past year.   Pt reports chronic leg pain in rt leg, which has greatly improved. He showed RD leg exercises that he has been working on with physical therapy. Nutrition-Focused physical exam completed. Findings are no fat depletion, mild muscle depletion, and no edema. Muscle depletion mainly in lower extremities (more significant in rt leg, which can be explained by pt's chronic leg pain). Noted exam was significantly improved since last visit.   Pt with no further nutrition-related questions at this time, but expressed appreciation for visit.   Labs reviewed: Na: 129.   Body mass index is 25.89 kg/m. Patient meets criteria for overweight based on current BMI.   Current diet order is Heart Healthy with 1.5 L fluid restriction, patient is consuming approximately 100% of meals at this time. Labs and medications reviewed.   No nutrition interventions warranted at this time. If nutrition issues arise, please consult RD.   [name] A. Jimmye Norman, RD, LDN, [place] Registered Dietitian II Certified Diabetes Care and Education Specialist Pager: [phone] After hours Pager: [phone]

## 2018-12-02 ENCOUNTER — Telehealth: Payer: Self-pay | Admitting: Cardiology

## 2018-12-02 DIAGNOSIS — I951 Orthostatic hypotension: Secondary | ICD-10-CM | POA: Diagnosis not present

## 2018-12-02 DIAGNOSIS — R7303 Prediabetes: Secondary | ICD-10-CM | POA: Diagnosis not present

## 2018-12-02 DIAGNOSIS — I480 Paroxysmal atrial fibrillation: Secondary | ICD-10-CM | POA: Diagnosis not present

## 2018-12-02 DIAGNOSIS — K759 Inflammatory liver disease, unspecified: Secondary | ICD-10-CM | POA: Diagnosis not present

## 2018-12-02 DIAGNOSIS — I420 Dilated cardiomyopathy: Secondary | ICD-10-CM | POA: Diagnosis not present

## 2018-12-02 DIAGNOSIS — N183 Chronic kidney disease, stage 3 (moderate): Secondary | ICD-10-CM | POA: Diagnosis not present

## 2018-12-02 DIAGNOSIS — Z951 Presence of aortocoronary bypass graft: Secondary | ICD-10-CM | POA: Diagnosis not present

## 2018-12-02 DIAGNOSIS — I130 Hypertensive heart and chronic kidney disease with heart failure and stage 1 through stage 4 chronic kidney disease, or unspecified chronic kidney disease: Secondary | ICD-10-CM | POA: Diagnosis not present

## 2018-12-02 DIAGNOSIS — K219 Gastro-esophageal reflux disease without esophagitis: Secondary | ICD-10-CM | POA: Diagnosis not present

## 2018-12-02 DIAGNOSIS — I2510 Atherosclerotic heart disease of native coronary artery without angina pectoris: Secondary | ICD-10-CM | POA: Diagnosis not present

## 2018-12-02 DIAGNOSIS — M48061 Spinal stenosis, lumbar region without neurogenic claudication: Secondary | ICD-10-CM | POA: Diagnosis not present

## 2018-12-02 DIAGNOSIS — G629 Polyneuropathy, unspecified: Secondary | ICD-10-CM | POA: Diagnosis not present

## 2018-12-02 DIAGNOSIS — I5042 Chronic combined systolic (congestive) and diastolic (congestive) heart failure: Secondary | ICD-10-CM | POA: Diagnosis not present

## 2018-12-02 DIAGNOSIS — E785 Hyperlipidemia, unspecified: Secondary | ICD-10-CM | POA: Diagnosis not present

## 2018-12-02 DIAGNOSIS — M1990 Unspecified osteoarthritis, unspecified site: Secondary | ICD-10-CM | POA: Diagnosis not present

## 2018-12-02 DIAGNOSIS — M6281 Muscle weakness (generalized): Secondary | ICD-10-CM | POA: Diagnosis not present

## 2018-12-02 DIAGNOSIS — J45909 Unspecified asthma, uncomplicated: Secondary | ICD-10-CM | POA: Diagnosis not present

## 2018-12-02 DIAGNOSIS — E0590 Thyrotoxicosis, unspecified without thyrotoxic crisis or storm: Secondary | ICD-10-CM | POA: Diagnosis not present

## 2018-12-02 NOTE — Telephone Encounter (Signed)
New message   Pt c/o medication issue:  1. Name of Medication: ranolazine (RANEXA) 1000 MG SR tablet  2. How are you currently taking this medication (dosage and times per day)? Take 1 tablet (1,000 mg total) by mouth 2 (two) times daily.  3. Are you having a reaction (difficulty breathing--STAT)? No  4. What is your medication issue? Daughter of patient calling in stating that soon after patient began taking this medication he became very sick and lost his ability to walk. Nausea, vomiting, loss of appetite, and loss of the use of his legs.

## 2018-12-02 NOTE — Telephone Encounter (Signed)
I spoke to the patient's daughter [name]) who was calling because since the start of Ranexa 1000 mg bid 2 weeks ago prescribed by Dr Curt Bears, the patient has been very sick with nausea/vomiting, loss of appetite and eventually severe leg weakness.    He was recently in the hospital 8/22 for the leg weakness and further evaluation.  His daughter said that he continues to take the Ranexa, but would like further advisement.

## 2018-12-02 NOTE — Telephone Encounter (Signed)
Left detailed message informing pt/dtr to decrease Ranexa to 500 mg BID, per Dr. Curt Bears instruction. Asked her to call the office tomorrow to confirm receipt of this instruction and that they are agreeable to plan. Have not updated pt medication list until speaking with pt/dtr.

## 2018-12-03 NOTE — Telephone Encounter (Signed)
Spoke with pt's daughter and she did in fact receive message and will decrease Renexa to 500 mg bid and will call next week with update on symptoms ./cy

## 2018-12-03 NOTE — Telephone Encounter (Signed)
  [name] is returning call

## 2018-12-04 DIAGNOSIS — I2510 Atherosclerotic heart disease of native coronary artery without angina pectoris: Secondary | ICD-10-CM | POA: Diagnosis not present

## 2018-12-04 DIAGNOSIS — I951 Orthostatic hypotension: Secondary | ICD-10-CM | POA: Diagnosis not present

## 2018-12-04 DIAGNOSIS — I5042 Chronic combined systolic (congestive) and diastolic (congestive) heart failure: Secondary | ICD-10-CM | POA: Diagnosis not present

## 2018-12-04 DIAGNOSIS — N183 Chronic kidney disease, stage 3 (moderate): Secondary | ICD-10-CM | POA: Diagnosis not present

## 2018-12-04 DIAGNOSIS — M48061 Spinal stenosis, lumbar region without neurogenic claudication: Secondary | ICD-10-CM | POA: Diagnosis not present

## 2018-12-04 DIAGNOSIS — I130 Hypertensive heart and chronic kidney disease with heart failure and stage 1 through stage 4 chronic kidney disease, or unspecified chronic kidney disease: Secondary | ICD-10-CM | POA: Diagnosis not present

## 2018-12-04 DIAGNOSIS — M6281 Muscle weakness (generalized): Secondary | ICD-10-CM | POA: Diagnosis not present

## 2018-12-04 DIAGNOSIS — I420 Dilated cardiomyopathy: Secondary | ICD-10-CM | POA: Diagnosis not present

## 2018-12-04 DIAGNOSIS — I480 Paroxysmal atrial fibrillation: Secondary | ICD-10-CM | POA: Diagnosis not present

## 2018-12-08 DIAGNOSIS — M6281 Muscle weakness (generalized): Secondary | ICD-10-CM | POA: Diagnosis not present

## 2018-12-08 DIAGNOSIS — I130 Hypertensive heart and chronic kidney disease with heart failure and stage 1 through stage 4 chronic kidney disease, or unspecified chronic kidney disease: Secondary | ICD-10-CM | POA: Diagnosis not present

## 2018-12-08 DIAGNOSIS — I951 Orthostatic hypotension: Secondary | ICD-10-CM | POA: Diagnosis not present

## 2018-12-08 DIAGNOSIS — M48061 Spinal stenosis, lumbar region without neurogenic claudication: Secondary | ICD-10-CM | POA: Diagnosis not present

## 2018-12-08 DIAGNOSIS — N183 Chronic kidney disease, stage 3 (moderate): Secondary | ICD-10-CM | POA: Diagnosis not present

## 2018-12-08 DIAGNOSIS — I2510 Atherosclerotic heart disease of native coronary artery without angina pectoris: Secondary | ICD-10-CM | POA: Diagnosis not present

## 2018-12-08 DIAGNOSIS — I480 Paroxysmal atrial fibrillation: Secondary | ICD-10-CM | POA: Diagnosis not present

## 2018-12-08 DIAGNOSIS — I5042 Chronic combined systolic (congestive) and diastolic (congestive) heart failure: Secondary | ICD-10-CM | POA: Diagnosis not present

## 2018-12-08 DIAGNOSIS — I420 Dilated cardiomyopathy: Secondary | ICD-10-CM | POA: Diagnosis not present

## 2018-12-10 ENCOUNTER — Telehealth: Payer: Self-pay | Admitting: Cardiology

## 2018-12-10 DIAGNOSIS — N183 Chronic kidney disease, stage 3 (moderate): Secondary | ICD-10-CM | POA: Diagnosis not present

## 2018-12-10 DIAGNOSIS — Z23 Encounter for immunization: Secondary | ICD-10-CM | POA: Diagnosis not present

## 2018-12-10 DIAGNOSIS — R262 Difficulty in walking, not elsewhere classified: Secondary | ICD-10-CM | POA: Diagnosis not present

## 2018-12-10 DIAGNOSIS — Z09 Encounter for follow-up examination after completed treatment for conditions other than malignant neoplasm: Secondary | ICD-10-CM | POA: Diagnosis not present

## 2018-12-10 DIAGNOSIS — R29898 Other symptoms and signs involving the musculoskeletal system: Secondary | ICD-10-CM | POA: Diagnosis not present

## 2018-12-10 DIAGNOSIS — I5032 Chronic diastolic (congestive) heart failure: Secondary | ICD-10-CM | POA: Diagnosis not present

## 2018-12-10 DIAGNOSIS — Z8679 Personal history of other diseases of the circulatory system: Secondary | ICD-10-CM | POA: Diagnosis not present

## 2018-12-10 DIAGNOSIS — I5022 Chronic systolic (congestive) heart failure: Secondary | ICD-10-CM | POA: Diagnosis not present

## 2018-12-10 DIAGNOSIS — R42 Dizziness and giddiness: Secondary | ICD-10-CM | POA: Diagnosis not present

## 2018-12-10 NOTE — Telephone Encounter (Signed)
Returned dtr's call. Reports pt has no improvement in symptoms, still weak and vomiting. (see 8/25 telephone encounter) She also asks if medication is truly needed. Advised to hold Ranexa until Dr. Curt Bears can advise. Aware it may be tomorrow before return call as he is in the hospital today. Dtr aware we will call once reviewed/advised on. Dtr agreeable to plan.

## 2018-12-10 NOTE — Telephone Encounter (Signed)
Left detailed message informing dtr to stop Ranexa, per Dr. Curt Bears.  Will not start another medication but monitor for now. Asked to call office back if she wanted to discuss further

## 2018-12-10 NOTE — Telephone Encounter (Signed)
Patient's daughter called to give update about the new medication he was put on.  There has been not improvement.  His legs are still weak and has been throwing up.

## 2018-12-12 DIAGNOSIS — I420 Dilated cardiomyopathy: Secondary | ICD-10-CM | POA: Diagnosis not present

## 2018-12-12 DIAGNOSIS — I130 Hypertensive heart and chronic kidney disease with heart failure and stage 1 through stage 4 chronic kidney disease, or unspecified chronic kidney disease: Secondary | ICD-10-CM | POA: Diagnosis not present

## 2018-12-12 DIAGNOSIS — I480 Paroxysmal atrial fibrillation: Secondary | ICD-10-CM | POA: Diagnosis not present

## 2018-12-12 DIAGNOSIS — N183 Chronic kidney disease, stage 3 (moderate): Secondary | ICD-10-CM | POA: Diagnosis not present

## 2018-12-12 DIAGNOSIS — M48061 Spinal stenosis, lumbar region without neurogenic claudication: Secondary | ICD-10-CM | POA: Diagnosis not present

## 2018-12-12 DIAGNOSIS — I2510 Atherosclerotic heart disease of native coronary artery without angina pectoris: Secondary | ICD-10-CM | POA: Diagnosis not present

## 2018-12-12 DIAGNOSIS — M6281 Muscle weakness (generalized): Secondary | ICD-10-CM | POA: Diagnosis not present

## 2018-12-12 DIAGNOSIS — I951 Orthostatic hypotension: Secondary | ICD-10-CM | POA: Diagnosis not present

## 2018-12-12 DIAGNOSIS — I5042 Chronic combined systolic (congestive) and diastolic (congestive) heart failure: Secondary | ICD-10-CM | POA: Diagnosis not present

## 2018-12-16 DIAGNOSIS — I951 Orthostatic hypotension: Secondary | ICD-10-CM | POA: Diagnosis not present

## 2018-12-16 DIAGNOSIS — N183 Chronic kidney disease, stage 3 (moderate): Secondary | ICD-10-CM | POA: Diagnosis not present

## 2018-12-16 DIAGNOSIS — I420 Dilated cardiomyopathy: Secondary | ICD-10-CM | POA: Diagnosis not present

## 2018-12-16 DIAGNOSIS — I5042 Chronic combined systolic (congestive) and diastolic (congestive) heart failure: Secondary | ICD-10-CM | POA: Diagnosis not present

## 2018-12-16 DIAGNOSIS — I130 Hypertensive heart and chronic kidney disease with heart failure and stage 1 through stage 4 chronic kidney disease, or unspecified chronic kidney disease: Secondary | ICD-10-CM | POA: Diagnosis not present

## 2018-12-16 DIAGNOSIS — I2510 Atherosclerotic heart disease of native coronary artery without angina pectoris: Secondary | ICD-10-CM | POA: Diagnosis not present

## 2018-12-16 DIAGNOSIS — M48061 Spinal stenosis, lumbar region without neurogenic claudication: Secondary | ICD-10-CM | POA: Diagnosis not present

## 2018-12-16 DIAGNOSIS — I480 Paroxysmal atrial fibrillation: Secondary | ICD-10-CM | POA: Diagnosis not present

## 2018-12-16 DIAGNOSIS — M6281 Muscle weakness (generalized): Secondary | ICD-10-CM | POA: Diagnosis not present

## 2018-12-18 DIAGNOSIS — I130 Hypertensive heart and chronic kidney disease with heart failure and stage 1 through stage 4 chronic kidney disease, or unspecified chronic kidney disease: Secondary | ICD-10-CM | POA: Diagnosis not present

## 2018-12-18 DIAGNOSIS — I951 Orthostatic hypotension: Secondary | ICD-10-CM | POA: Diagnosis not present

## 2018-12-18 DIAGNOSIS — M6281 Muscle weakness (generalized): Secondary | ICD-10-CM | POA: Diagnosis not present

## 2018-12-18 DIAGNOSIS — I480 Paroxysmal atrial fibrillation: Secondary | ICD-10-CM | POA: Diagnosis not present

## 2018-12-18 DIAGNOSIS — N183 Chronic kidney disease, stage 3 (moderate): Secondary | ICD-10-CM | POA: Diagnosis not present

## 2018-12-18 DIAGNOSIS — I2510 Atherosclerotic heart disease of native coronary artery without angina pectoris: Secondary | ICD-10-CM | POA: Diagnosis not present

## 2018-12-18 DIAGNOSIS — M48061 Spinal stenosis, lumbar region without neurogenic claudication: Secondary | ICD-10-CM | POA: Diagnosis not present

## 2018-12-18 DIAGNOSIS — I420 Dilated cardiomyopathy: Secondary | ICD-10-CM | POA: Diagnosis not present

## 2018-12-18 DIAGNOSIS — I5042 Chronic combined systolic (congestive) and diastolic (congestive) heart failure: Secondary | ICD-10-CM | POA: Diagnosis not present

## 2018-12-19 DIAGNOSIS — I5022 Chronic systolic (congestive) heart failure: Secondary | ICD-10-CM | POA: Diagnosis not present

## 2018-12-19 DIAGNOSIS — E1122 Type 2 diabetes mellitus with diabetic chronic kidney disease: Secondary | ICD-10-CM | POA: Diagnosis not present

## 2018-12-19 DIAGNOSIS — K2270 Barrett's esophagus without dysplasia: Secondary | ICD-10-CM | POA: Diagnosis not present

## 2018-12-19 DIAGNOSIS — E875 Hyperkalemia: Secondary | ICD-10-CM | POA: Diagnosis not present

## 2018-12-19 DIAGNOSIS — I129 Hypertensive chronic kidney disease with stage 1 through stage 4 chronic kidney disease, or unspecified chronic kidney disease: Secondary | ICD-10-CM | POA: Diagnosis not present

## 2018-12-19 DIAGNOSIS — N184 Chronic kidney disease, stage 4 (severe): Secondary | ICD-10-CM | POA: Diagnosis not present

## 2018-12-23 DIAGNOSIS — N183 Chronic kidney disease, stage 3 (moderate): Secondary | ICD-10-CM | POA: Diagnosis not present

## 2018-12-23 DIAGNOSIS — I130 Hypertensive heart and chronic kidney disease with heart failure and stage 1 through stage 4 chronic kidney disease, or unspecified chronic kidney disease: Secondary | ICD-10-CM | POA: Diagnosis not present

## 2018-12-23 DIAGNOSIS — I951 Orthostatic hypotension: Secondary | ICD-10-CM | POA: Diagnosis not present

## 2018-12-23 DIAGNOSIS — I480 Paroxysmal atrial fibrillation: Secondary | ICD-10-CM | POA: Diagnosis not present

## 2018-12-23 DIAGNOSIS — I2510 Atherosclerotic heart disease of native coronary artery without angina pectoris: Secondary | ICD-10-CM | POA: Diagnosis not present

## 2018-12-23 DIAGNOSIS — M48061 Spinal stenosis, lumbar region without neurogenic claudication: Secondary | ICD-10-CM | POA: Diagnosis not present

## 2018-12-23 DIAGNOSIS — M6281 Muscle weakness (generalized): Secondary | ICD-10-CM | POA: Diagnosis not present

## 2018-12-23 DIAGNOSIS — I5042 Chronic combined systolic (congestive) and diastolic (congestive) heart failure: Secondary | ICD-10-CM | POA: Diagnosis not present

## 2018-12-23 DIAGNOSIS — I420 Dilated cardiomyopathy: Secondary | ICD-10-CM | POA: Diagnosis not present

## 2018-12-25 DIAGNOSIS — I2510 Atherosclerotic heart disease of native coronary artery without angina pectoris: Secondary | ICD-10-CM | POA: Diagnosis not present

## 2018-12-25 DIAGNOSIS — I480 Paroxysmal atrial fibrillation: Secondary | ICD-10-CM | POA: Diagnosis not present

## 2018-12-25 DIAGNOSIS — M6281 Muscle weakness (generalized): Secondary | ICD-10-CM | POA: Diagnosis not present

## 2018-12-25 DIAGNOSIS — I420 Dilated cardiomyopathy: Secondary | ICD-10-CM | POA: Diagnosis not present

## 2018-12-25 DIAGNOSIS — I951 Orthostatic hypotension: Secondary | ICD-10-CM | POA: Diagnosis not present

## 2018-12-25 DIAGNOSIS — I130 Hypertensive heart and chronic kidney disease with heart failure and stage 1 through stage 4 chronic kidney disease, or unspecified chronic kidney disease: Secondary | ICD-10-CM | POA: Diagnosis not present

## 2018-12-25 DIAGNOSIS — N183 Chronic kidney disease, stage 3 (moderate): Secondary | ICD-10-CM | POA: Diagnosis not present

## 2018-12-25 DIAGNOSIS — I5042 Chronic combined systolic (congestive) and diastolic (congestive) heart failure: Secondary | ICD-10-CM | POA: Diagnosis not present

## 2018-12-25 DIAGNOSIS — M48061 Spinal stenosis, lumbar region without neurogenic claudication: Secondary | ICD-10-CM | POA: Diagnosis not present

## 2018-12-30 DIAGNOSIS — N184 Chronic kidney disease, stage 4 (severe): Secondary | ICD-10-CM | POA: Diagnosis not present

## 2019-01-05 ENCOUNTER — Other Ambulatory Visit: Payer: Self-pay | Admitting: Nephrology

## 2019-01-05 DIAGNOSIS — N184 Chronic kidney disease, stage 4 (severe): Secondary | ICD-10-CM

## 2019-01-12 ENCOUNTER — Ambulatory Visit
Admission: RE | Admit: 2019-01-12 | Discharge: 2019-01-12 | Disposition: A | Discharge status: Home / Self Care | Payer: Medicare HMO | Source: Ambulatory Visit | Attending: Nephrology | Admitting: Nephrology

## 2019-01-12 DIAGNOSIS — N184 Chronic kidney disease, stage 4 (severe): Secondary | ICD-10-CM | POA: Diagnosis not present

## 2019-01-28 ENCOUNTER — Other Ambulatory Visit: Payer: Self-pay | Admitting: Cardiology

## 2019-01-28 NOTE — Telephone Encounter (Signed)
Pt last saw Dr Haroldine Laws on 11/14/18, last labs 12/19/18 Creat 1.93, age 85, weight 89kg, based on specified criteria pt is on appropriate dosage 2.20m Eliquis BID.  Will refill rx.

## 2019-02-17 ENCOUNTER — Ambulatory Visit (INDEPENDENT_AMBULATORY_CARE_PROVIDER_SITE_OTHER): Payer: Medicare HMO | Admitting: *Deleted

## 2019-02-17 DIAGNOSIS — I420 Dilated cardiomyopathy: Secondary | ICD-10-CM | POA: Diagnosis not present

## 2019-02-17 DIAGNOSIS — I472 Ventricular tachycardia, unspecified: Secondary | ICD-10-CM

## 2019-02-17 LAB — CUP PACEART REMOTE DEVICE CHECK
Battery Remaining Longevity: 39 mo
Battery Voltage: 2.96 V
Brady Statistic AP VP Percent: 71.65 %
Brady Statistic AP VS Percent: 0.42 %
Brady Statistic AS VP Percent: 27.6 %
Brady Statistic AS VS Percent: 0.32 %
Brady Statistic RA Percent Paced: 71.6 %
Brady Statistic RV Percent Paced: 98.66 %
Date Time Interrogation Session: 20201110072724
HighPow Impedance: 78 Ohm
Implantable Lead Implant Date: 20170113
Implantable Lead Implant Date: 20170113
Implantable Lead Implant Date: 20170113
Implantable Lead Location: 753858
Implantable Lead Location: 753859
Implantable Lead Location: 753860
Implantable Lead Model: 4298
Implantable Lead Model: 5076
Implantable Pulse Generator Implant Date: 20170113
Lead Channel Impedance Value: 342 Ohm
Lead Channel Impedance Value: 342 Ohm
Lead Channel Impedance Value: 380 Ohm
Lead Channel Impedance Value: 380 Ohm
Lead Channel Impedance Value: 399 Ohm
Lead Channel Impedance Value: 399 Ohm
Lead Channel Impedance Value: 456 Ohm
Lead Channel Impedance Value: 494 Ohm
Lead Channel Impedance Value: 589 Ohm
Lead Channel Impedance Value: 627 Ohm
Lead Channel Impedance Value: 722 Ohm
Lead Channel Impedance Value: 760 Ohm
Lead Channel Impedance Value: 760 Ohm
Lead Channel Pacing Threshold Amplitude: 0.5 V
Lead Channel Pacing Threshold Amplitude: 0.5 V
Lead Channel Pacing Threshold Amplitude: 0.75 V
Lead Channel Pacing Threshold Pulse Width: 0.4 ms
Lead Channel Pacing Threshold Pulse Width: 0.4 ms
Lead Channel Pacing Threshold Pulse Width: 0.4 ms
Lead Channel Sensing Intrinsic Amplitude: 2.5 mV
Lead Channel Sensing Intrinsic Amplitude: 2.5 mV
Lead Channel Sensing Intrinsic Amplitude: 8.25 mV
Lead Channel Sensing Intrinsic Amplitude: 8.25 mV
Lead Channel Setting Pacing Amplitude: 1.5 V
Lead Channel Setting Pacing Amplitude: 1.5 V
Lead Channel Setting Pacing Amplitude: 2 V
Lead Channel Setting Pacing Pulse Width: 0.4 ms
Lead Channel Setting Pacing Pulse Width: 0.4 ms
Lead Channel Setting Sensing Sensitivity: 0.3 mV

## 2019-03-09 DIAGNOSIS — N184 Chronic kidney disease, stage 4 (severe): Secondary | ICD-10-CM | POA: Diagnosis not present

## 2019-03-10 NOTE — Progress Notes (Signed)
Remote ICD transmission.

## 2019-03-16 DIAGNOSIS — N184 Chronic kidney disease, stage 4 (severe): Secondary | ICD-10-CM | POA: Diagnosis not present

## 2019-03-16 DIAGNOSIS — I5022 Chronic systolic (congestive) heart failure: Secondary | ICD-10-CM | POA: Diagnosis not present

## 2019-03-16 DIAGNOSIS — E1122 Type 2 diabetes mellitus with diabetic chronic kidney disease: Secondary | ICD-10-CM | POA: Diagnosis not present

## 2019-03-16 DIAGNOSIS — E875 Hyperkalemia: Secondary | ICD-10-CM | POA: Diagnosis not present

## 2019-03-16 DIAGNOSIS — K2270 Barrett's esophagus without dysplasia: Secondary | ICD-10-CM | POA: Diagnosis not present

## 2019-03-16 DIAGNOSIS — I129 Hypertensive chronic kidney disease with stage 1 through stage 4 chronic kidney disease, or unspecified chronic kidney disease: Secondary | ICD-10-CM | POA: Diagnosis not present

## 2019-03-24 ENCOUNTER — Ambulatory Visit (INDEPENDENT_AMBULATORY_CARE_PROVIDER_SITE_OTHER): Payer: Medicare HMO | Admitting: Cardiology

## 2019-03-24 ENCOUNTER — Other Ambulatory Visit: Payer: Self-pay

## 2019-03-24 ENCOUNTER — Encounter: Payer: Self-pay | Admitting: Cardiology

## 2019-03-24 VITALS — BP 124/86 | HR 78 | Ht 73.0 in | Wt 209.0 lb

## 2019-03-24 DIAGNOSIS — I472 Ventricular tachycardia, unspecified: Secondary | ICD-10-CM

## 2019-03-24 DIAGNOSIS — I5043 Acute on chronic combined systolic (congestive) and diastolic (congestive) heart failure: Secondary | ICD-10-CM | POA: Diagnosis not present

## 2019-03-24 DIAGNOSIS — I4819 Other persistent atrial fibrillation: Secondary | ICD-10-CM | POA: Diagnosis not present

## 2019-03-24 MED ORDER — SOTALOL HCL 80 MG PO TABS: 40.0000 mg | ORAL_TABLET | Freq: Two times a day (BID) | ORAL | 3 refills | Status: DC

## 2019-03-24 NOTE — Patient Instructions (Addendum)
Medication Instructions:  Your physician has recommended you make the following change in your medication:  1. START Sotalol 40 mg twice a day  * If you need a refill on your cardiac medications before your next appointment, please call your pharmacy.   Labwork: None ordered  Testing/Procedures: None ordered  Follow-Up: You are scheduled for a nurse visit EKG on 04/01/2019 @ 11:45 am  At [facility], you and your health needs are our priority.  As part of our continuing mission to provide you with exceptional heart care, we have created designated Provider Care Teams.  These Care Teams include your primary Cardiologist (physician) and Advanced Practice Providers (APPs -  Physician Assistants and Nurse Practitioners) who all work together to provide you with the care you need, when you need it.  You will need a follow up appointment in 3 months.  Please call our office 2 months in advance to schedule this appointment.  You may see Dr Curt Bears or one of the following Advanced Practice Providers on your designated Care Team:    Chanetta Marshall, NP  Tommye Standard, PA-C  Oda Kilts, [place]   Thank you for choosing [facility]!!   Trinidad Curet, RN [phone]  Any Other Special Instructions Will Be Listed Below (If Applicable).   Sotalol tablets (Betapace AF) What is this medicine? SOTALOL (SOE ta lole) is a beta-blocker. Beta-blockers reduce the workload on the heart and help it to beat more regularly. This medicine is used to treat patients with an atrial heart arrhythmia such as atrial fibrillation. This medicine can help your heart return to and maintain a normal rhythm. This medicine may be used for other purposes; ask your health care provider or pharmacist if you have questions. COMMON BRAND NAME(S): BETAPACE AF What should I tell my health care provider before I take this medicine? They need to know if you have any of these conditions:  diabetes  heart or vessel  disease like slow heart rate, worsening heart failure, heart block, sick sinus syndrome or Raynaud's disease  history of low levels of potassium or magnesium  kidney disease  liver disease  lung or breathing disease, like asthma or emphysema  pheochromocytoma  recent heart attack  thyroid disease  an unusual or allergic reaction to sotalol, other beta-blockers, medicines, foods, dyes, or preservatives  pregnant or trying to get pregnant  breast-feeding How should I use this medicine? Take this medicine by mouth with a glass of water. Follow the directions on the prescription label. Take your doses at regular intervals. Do not take your medicine more often than directed. Do not stop taking this medicine suddenly. This could lead to serious heart-related effects. Talk to your pediatrician regarding the use of this medicine in children. Special care may be needed. While this medicine may be used in children for selected conditions precautions do apply. Overdosage: If you think you have taken too much of this medicine contact a poison control center or emergency room at once. NOTE: This medicine is only for you. Do not share this medicine with others. What if I miss a dose? If you miss a dose, take it as soon as you can. If it is almost time for your next dose, take only that dose. Do not take double or extra doses. What may interact with this medicine? Do not take this medicine with any of the following medications:  amoxapine  arsenic trioxide  certain antibiotics like gatifloxacin, grepafloxacin, levofloxacin, moxifloxacin, sparfloxacin, telithromycin  cisapride  droperidol  haloperidol  [place]  maprotiline  medicines for malaria like chloroquine and halofantrine  medicines to control heart rhythm  methadone  pentamidine  phenothiazines like prochlorperazine, perphenazine, thioridazine, and others  pimozide  ranolazine  tricyclic antidepressants like  amitriptyline, imipramine, nortriptyline, and others  vardenafil This medicine may also interact with the following medications:  antacids  certain antibiotics such as clarithromycin and erythromycin  clonidine  digoxin  medicines for angina or high blood pressure  medicines for colds and breathing difficulties  medicines for diabetes  other beta-blockers like atenolol, metoprolol, propranolol and others  ziprasidone This list may not describe all possible interactions. Give your health care provider a list of all the medicines, herbs, non-prescription drugs, or dietary supplements you use. Also tell them if you smoke, drink alcohol, or use illegal drugs. Some items may interact with your medicine. What should I watch for while using this medicine? You will be started on this medicine in a specialized facility for the first two or more days of treatment. Visit your doctor or health care professional for regular checks on your progress. Check your heart rate and blood pressure regularly while you are taking this medicine. Ask your doctor or health care professional what your heart rate and blood pressure should be, and when you should contact him or her. Your doctor or health care professional also may schedule regular blood tests and electrocardiograms to check your progress. Because your condition and the use of this medicine carry some risk, it is a good idea to carry an identification card, necklace or bracelet with details of your condition, medications, and doctor or health care professional. Dennis Bast may get drowsy or dizzy. Do not drive, use machinery, or do anything that needs mental alertness until you know how this drug affects you. Do not stand or sit up quickly, especially if you are an older patient. This reduces the risk of dizzy or fainting spells. Alcohol can make you more drowsy and dizzy. Avoid alcoholic drinks. Do not treat yourself for coughs, colds, or pain while you are  taking this medicine without asking your doctor or health care professional for advice. Some ingredients may increase your blood pressure. If you are going to have surgery, tell your doctor or health care professional that you are taking this medicine. What side effects may I notice from receiving this medicine? Side effects that you should report to your doctor or health care professional as soon as possible:  allergic reactions like skin rash, itching or hives, swelling of the face, lips, or tongue  chest pain  cold, tingling, or numb hands or feet  confusion  diarrhea  difficulty breathing, wheezing  irregular heartbeat  muscle aches and pains  slow heart rate  sweating  swollen legs or ankles  tremor, shakes  vomiting Side effects that usually do not require medical attention (report to your doctor or health care professional if they continue or are bothersome):  change in sex drive or performance  mental depression  nausea  weakness or tiredness This list may not describe all possible side effects. Call your doctor for medical advice about side effects. You may report side effects to FDA at 1-800-FDA-1088. Where should I keep my medicine? Keep out of the reach of children. Store at room temperature between 15 and 30 degrees C (59 and 86 degrees F). Throw away any unused medicine after the expiration date. NOTE: This sheet is a summary. It may not cover all possible information. If you  have questions about this medicine, talk to your doctor, pharmacist, or health care provider.  2020 Elsevier/Gold Standard (2018-03-18 11:01:52)

## 2019-03-24 NOTE — Progress Notes (Signed)
Electrophysiology Office Note   Date:  03/24/2019   ID:  [name], DOB 07/19/1932, MRN [id number]  PCP:  [name], MD  Cardiologist:  [name] Primary Electrophysiologist: Shaketta Rill Meredith Leeds, MD    No chief complaint on file.    History of Present Illness: [name] is a 85 y.o. male who presents today for electrophysiology evaluation.   He has a history of hypertension, aortic stenosis status post AVR, chronic combined systolic and diastolic CHF in the setting of A. fib with RVR, orthostatic hypotension, dilated cardiomyopathy EF 25-30%, status post CABG, and paroxysmal atrial fibrillation.  He does have paroxysmal atrial fibrillation has been cardioverted in the past and is now on a fixed within with rhythm control via amiodarone. He had CRT-D upgrade on 04/22/15.  He was having VT below his detection limit.  Device limits were changed in the last visit and he was put on mexiletine.  He was recently hospitalized for systolic heart failure with aggressive diuresis and thoracentesis.  He had a wide-complex tachycardia during hospitalization at a rate of approximately 110 bpm.  He has had multiple episodes of ATP over the last few weeks.  Today, denies symptoms of palpitations, chest pain, shortness of breath, orthopnea, PND, lower extremity edema, claudication, dizziness, presyncope, syncope, bleeding, or neurologic sequela. The patient is tolerating medications without difficulties.  Continues to have some weakness and fatigue, though this is at his baseline.  He also continues not to drive.  He has no chest pain or shortness of breath.  Past Medical History:  Diagnosis Date  . Arthritis    "minor everywhere" (04/22/2015)  . Asthma    "a touch"  . Barrett esophagus   . CAD (coronary artery disease)    a. s/p 3 vvessel CABG with LIMA to LAD, SVG to diagonal 1, SVG to RCA 12/09.  . Cardiac resynchronization therapy defibrillator (CRT-D) in place   . Chronic  anticoagulation 01/23/2013  . Chronic combined systolic and diastolic CHF (congestive heart failure) (HCC)    dry weight 197-200lbs.  . CKD (chronic kidney disease) stage 3, GFR 30-59 ml/min 08/23/2014  . DCM (dilated cardiomyopathy) ([place])    initially concerned for TTP amyloidosis but Tc-PYP scan 06/2017 was not consistent with amyloid and SPEP/UPEP with no M spike. EF 20-25% by echo 2019  . Dyslipidemia 01/23/2013  . Essential hypertension 01/23/2013  . GERD (gastroesophageal reflux disease)   . GI bleed 05/08/2017  . H/O: rheumatic fever   . Hepatitis 1957   "don't know what kind:  . History of hiatal hernia   . History of PFTs    a. Amiodarone started 5/16 >> PFTs w/ DLCO 6/16:  FEV1 72% predicted, FEV1/FVC 66%, DLCO 66% >> minimal reversible obstructive airways disease with mild diffusion defect (suggestive of emphysema but absence of hyperinflation inconsistent with dx)  . Hyperkalemia 08/23/2014  . Hyperthyroidism 05/26/2017  . Leg weakness 01/28/2017  . Multiple thyroid nodules 08/13/2017  . Orthostatic hypotension   . Persistent atrial fibrillation ([place]) 01/23/2013  . Pleural effusion on right 05/26/2017  . Pneumonia 08/2014   "dr thought I may have had a touch"  . Pre-diabetes   . Protein-calorie malnutrition, severe 05/27/2017  . S/P AVR (aortic valve replacement) 2009   a. severe AS s/p AVR with pericardial tissue valve 2009.  [name] 08/23/2014  . VT (ventricular tachycardia) ([place]) 05/06/2017   Past Surgical History:  Procedure Laterality Date  . AORTIC VALVE REPLACEMENT  with 23-mm Magna  Ease pericardial valve, model number   with 23-mm Magna Ease pericardial valve, model [id number], serial number [id number]   . APPENDECTOMY  1940s  . BI-VENTRICULAR IMPLANTABLE CARDIOVERTER DEFIBRILLATOR  (CRT-D)  04/22/2015  . CARDIAC CATHETERIZATION N/A 11/04/2014   Procedure: Left Heart Cath and Cors/Grafts Angiography;  Surgeon: Leonie Man, MD;  Location: [facility];   Service: Cardiovascular;  Laterality: N/A;  . CARDIAC VALVE REPLACEMENT    . CARDIOVERSION N/A 01/27/2013   Procedure: CARDIOVERSION;  Surgeon: Sueanne Margarita, MD;  Location: [facility];  Service: Cardiovascular;  Laterality: N/A;  . CARDIOVERSION N/A 08/25/2014   Procedure: CARDIOVERSION;  Surgeon: Sanda Klein, MD;  Location: [facility];  Service: Cardiovascular;  Laterality: N/A;  . CARDIOVERSION N/A 05/28/2018   Procedure: CARDIOVERSION;  Surgeon: Pixie Casino, MD;  Location: [facility];  Service: Cardiovascular;  Laterality: N/A;  . CATARACT EXTRACTION W/ INTRAOCULAR LENS  IMPLANT, BILATERAL Bilateral   . CHEST TUBE INSERTION Right 07/26/2017   Procedure: INSERTION PLEURAL DRAINAGE CATHETER - RIGHT;  Surgeon: Ivin Poot, MD;  Location: [facility];  Service: Thoracic;  Laterality: Right;  . CORONARY ARTERY BYPASS GRAFT  03/10/2008   x 3 Dr. Roxan Hockey  . EP IMPLANTABLE DEVICE N/A 04/22/2015   Procedure: BiV ICD Insertion CRT-D;  Surgeon: Vencil Basnett Meredith Leeds, MD;  Location: [facility];  Service: Cardiovascular;  Laterality: N/A;  . ESOPHAGOGASTRODUODENOSCOPY (EGD) WITH ESOPHAGEAL DILATION  X1  . ESOPHAGOGASTRODUODENOSCOPY (EGD) WITH PROPOFOL N/A 05/09/2017   Procedure: ESOPHAGOGASTRODUODENOSCOPY (EGD) WITH PROPOFOL;  Surgeon: Carol Ada, MD;  Location: [facility];  Service: Endoscopy;  Laterality: N/A;  . IR THORACENTESIS ASP PLEURAL SPACE W/IMG GUIDE  05/27/2017  . IR THORACENTESIS ASP PLEURAL SPACE W/IMG GUIDE  06/17/2017  . IR THORACENTESIS ASP PLEURAL SPACE W/IMG GUIDE  06/18/2017  . IR THORACENTESIS ASP PLEURAL SPACE W/IMG GUIDE  07/11/2017  . REMOVAL OF PLEURAL DRAINAGE CATHETER Right 09/13/2017   Procedure: REMOVAL OF PLEURAL DRAINAGE CATHETER;  Surgeon: Ivin Poot, MD;  Location: [facility];  Service: Thoracic;  Laterality: Right;  . RIGHT HEART CATH AND CORONARY/GRAFT ANGIOGRAPHY N/A 05/31/2017   Procedure: RIGHT HEART CATH AND CORONARY/GRAFT ANGIOGRAPHY;  Surgeon:  Jolaine Artist, MD;  Location: [facility];  Service: Cardiovascular;  Laterality: N/A;  . TONSILLECTOMY       Current Outpatient Medications  Medication Sig Dispense Refill  . acetaminophen (TYLENOL) 325 MG tablet Take 650 mg by mouth every 6 (six) hours as needed for mild pain.    [name] Kitchen albuterol (PROVENTIL HFA;VENTOLIN HFA) 108 (90 Base) MCG/ACT inhaler Inhale 1-2 puffs into the lungs every 6 (six) hours as needed for wheezing.     [name] Kitchen atorvastatin (LIPITOR) 20 MG tablet TAKE 1 TABLET EVERY DAY  AT  6  PM (Patient taking differently: Take 20 mg by mouth daily at 6 PM. ) 90 tablet 3  . carvedilol (COREG) 25 MG tablet Take 1 tablet (25 mg total) by mouth 2 (two) times daily. 180 tablet 3  . ELIQUIS 2.5 MG TABS tablet TAKE 1 TABLET TWICE DAILY 180 tablet 1  . famotidine (PEPCID) 40 MG tablet Take 40 mg by mouth daily.    . furosemide (LASIX) 40 MG tablet TAKE 1 TABLET TWICE DAILY (Patient taking differently: Take 40 mg by mouth 2 (two) times daily. ) 180 tablet 3  . hydrALAZINE (APRESOLINE) 25 MG tablet TAKE 1/2 TABLET TWICE DAILY BEFORE MEALS 90 tablet 0  . isosorbide mononitrate (IMDUR) 30 MG 24  hr tablet TAKE 1 TABLET EVERY DAY (Patient taking differently: Take 30 mg by mouth daily. ) 90 tablet 3  . mexiletine (MEXITIL) 150 MG capsule Take 2 capsules (300 mg total) by mouth 3 (three) times daily. 540 capsule 3  . Multiple Vitamin (MULTIVITAMIN WITH MINERALS) TABS tablet Take 1 tablet by mouth daily.     . Omega-3 Fatty Acids (FISH OIL) 1000 MG CAPS Take 2,000 mg by mouth 2 (two) times daily.    . pantoprazole (PROTONIX) 40 MG tablet Take 1 tablet by mouth daily.    . potassium chloride SA (K-DUR,KLOR-CON) 20 MEQ tablet Take 1 tablet (20 mEq total) by mouth daily. (Patient taking differently: Take 20 mEq by mouth every other day. ) 90 tablet 3  . promethazine (PHENERGAN) 25 MG tablet Take 25 mg by mouth every 8 (eight) hours.    . traMADol (ULTRAM) 50 MG tablet Take 1 tablet (50 mg  total) by mouth every 12 (twelve) hours as needed for moderate pain or severe pain. 20 tablet 0  . sotalol (BETAPACE) 80 MG tablet Take 0.5 tablets (40 mg total) by mouth 2 (two) times daily. 30 tablet 3   No current facility-administered medications for this visit.    Allergies:   Novocain [procaine] and Amiodarone   Social History:  The patient  reports that he has never smoked. He has never used smokeless tobacco. He reports previous alcohol use. He reports that he does not use drugs.   Family History:  The patient's family history includes Alzheimer's disease in his mother and another family member; COPD in his daughter and sister; Depression in his sister; Esophageal cancer in his sister; Heart disease in his sister; Hyperlipidemia in his sister; Hypertension in his sister.   ROS:  Please see the history of present illness.   Otherwise, review of systems is positive for none.   All other systems are reviewed and negative.   PHYSICAL EXAM: VS:  BP 124/86   Pulse 78   Ht _0  (1.854 m)   Wt 209 lb (94.8 kg)   SpO2 99%   BMI 27.57 kg/m  , BMI Body mass index is 27.57 kg/m. GEN: Well nourished, well developed, in no acute distress  HEENT: normal  Neck: no JVD, carotid bruits, or masses Cardiac: RRR; no murmurs, rubs, or gallops,no edema  Respiratory:  clear to auscultation bilaterally, normal work of breathing GI: soft, nontender, nondistended, + BS MS: no deformity or atrophy  Skin: warm and dry, device site well healed Neuro:  Strength and sensation are intact Psych: euthymic mood, full affect  EKG:  EKG is ordered today. Personal review of the ekg ordered shows a V paced  Personal review of the device interrogation today. Results in [place]: 11/14/2018: B Natriuretic Peptide 313.2 11/29/2018: ALT 24 11/30/2018: Hemoglobin 14.2; Platelets 261 12/01/2018: BUN 48; Creatinine, Ser 2.98; Potassium 4.6; Sodium 129    Lipid Panel     Component Value Date/Time     CHOL 126 07/30/2016 1143   CHOL 93 (L) 06/16/2014 0914   TRIG 154 (H) 07/30/2016 1143   TRIG 124 06/16/2014 0914   HDL 45 07/30/2016 1143   HDL 34 (L) 06/16/2014 0914   CHOLHDL 2.8 07/30/2016 1143   CHOLHDL 2.7 01/19/2015 1005   VLDL 32 (H) 01/19/2015 1005   LDLCALC 50 07/30/2016 1143   LDLCALC 34 06/16/2014 0914     Wt Readings from Last 3 Encounters:  03/24/19 209 lb (94.8 kg)  12/01/18 196 lb 3.4 oz (89 kg)  11/14/18 199 lb 3.2 oz (90.4 kg)      Other studies Reviewed: Additional studies/ records that were reviewed today include: TTE 05/29/17 Review of the above records today demonstrates:  - Left ventricle: The cavity size was normal. Wall thickness was   increased in a pattern of mild LVH. Systolic function was   severely reduced. The estimated ejection fraction was in the   range of 20% to 25%. There is akinesis of the inferolateral,   inferior, and inferoseptal myocardium. Features are consistent   with a pseudonormal left ventricular filling pattern, with   concomitant abnormal relaxation and increased filling pressure   (grade 2 diastolic dysfunction). - Aortic valve: A pericardial tissue valve bioprosthesis was   present and functioning normally. Peak velocity (S): 226 cm/s.   Mean gradient (S): 10 mm Hg. Valve area (VTI): 1.52 cm^2. Valve   area (Vmax): 1.5 cm^2. Valve area (Vmean): 1.59 cm^2. - Mitral valve: Calcified annulus. Mildly thickened leaflets .   There was mild regurgitation. - Left atrium: The atrium was severely dilated. - Right ventricle: Pacer wire or catheter noted in right ventricle. - Pulmonary arteries: Systolic pressure was mildly increased. PA   peak pressure: 35 mm Hg (S).  RHC/LHC 05/31/17  Prox LAD to Mid LAD lesion is 50% stenosed.  Mid LAD to Dist LAD lesion is 25% stenosed.  1st Mrg lesion is 50% stenosed.  Prox RCA to Dist RCA lesion is 100% stenosed.  And is large.  Prox Graft to Mid Graft lesion is 30% stenosed.  And  is large.  The flow in the graft is reversed.  There is competitive flow.  And is large.  There is competitive flow.  Ost 1st Diag to 1st Diag lesion is 90% stenosed.   Findings:  Ao = 145/67 (99)  RA = 6 RV = 70/9 PA = 69/30 (46) PCW = 26 v = 37 Fick cardiac output/index =4.7/2.3 PVR = 4.3 FA sat = 98% PA sat = 63%, 60%  Assessment:  1. 3v CAD with stable revascularization with patent LIMA-LAD, SVG-diagonal and SVG-RCA 2. 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 Normal cardiac output - no evidence of thyrotoxicosis heart disease or PH   ASSESSMENT AND PLAN:  1.  Chronic combined systolic and diastolic congestive heart failure due to ischemic cardiomyopathy: Status post [employer]tronic CRT-D implanted 04/22/2015.  Device functioning appropriately.  Currently on mexiletine due to ventricular arrhythmias.  He is unfortunately continued to have VT with an episode of ATP in October.  Due to that, we Marq Rebello start him on 40 mg of sotalol to potentially be increased.  No other changes.  2.  Persistent atrial fibrillation: Currently on Eliquis.  CHA2DS2-VASc 5.    3.  Ventricular tachycardia: Plan to start sotalol as above.  He does not drive.  Reminded of no driving per [facility].  4.  Coronary artery disease status post CABG: No current chest pain  Current medicines are reviewed at length with the patient today.   The patient does not have concerns regarding his medicines.  The following changes were made today: Start sotalol  Labs/ tests ordered today include:  Orders Placed This Encounter  Procedures  . EKG 12-Lead    Disposition:   FU with Saaya Procell 3 months  Signed, all of Evie Croston Meredith Leeds, MD  03/24/2019 4:18 PM     [facility]  [address] [place] [address] [address] [phone] (office) [phone] (fax)

## 2019-04-01 ENCOUNTER — Ambulatory Visit (INDEPENDENT_AMBULATORY_CARE_PROVIDER_SITE_OTHER): Payer: Medicare HMO | Admitting: *Deleted

## 2019-04-01 ENCOUNTER — Other Ambulatory Visit: Payer: Self-pay

## 2019-04-01 VITALS — HR 80 | Ht 73.0 in | Wt 204.6 lb

## 2019-04-01 DIAGNOSIS — I472 Ventricular tachycardia, unspecified: Secondary | ICD-10-CM

## 2019-04-01 NOTE — Patient Instructions (Signed)
Nurse will call you next week to see how you are doing on the Sotalol

## 2019-04-01 NOTE — Progress Notes (Signed)
1.  Reason for visit: EKG post low dose Sotalol start  2.  Name of MD requesting visit:  Camnitz  3. H&P:  VT  4.  ROS related to problem:  nausea  5.  Assessment and plan per MD:   EKG performed, NSR HR 80. Pt reported nausea for several hours everytime he takes the Sotalol.  Per Dr. Curt Bears, monitor for now, follow up with pt next week to see if better or the same.

## 2019-04-09 ENCOUNTER — Telehealth: Payer: Self-pay | Admitting: *Deleted

## 2019-04-09 NOTE — Telephone Encounter (Signed)
Followed up with pt per Dr. Curt Bears request as pt c/o nausea when taking Sotalol. Pt reports this has resolved since they fixed his reflux medications. Pt will continue Sotalol

## 2019-04-13 ENCOUNTER — Other Ambulatory Visit: Payer: Self-pay | Admitting: Cardiology

## 2019-04-14 MED ORDER — HYDRALAZINE HCL 25 MG PO TABS: ORAL_TABLET | ORAL | 0 refills | Status: DC

## 2019-04-16 LAB — CUP PACEART INCLINIC DEVICE CHECK
Date Time Interrogation Session: 20201215094937
Implantable Lead Implant Date: 20170113
Implantable Lead Implant Date: 20170113
Implantable Lead Implant Date: 20170113
Implantable Lead Location: 753858
Implantable Lead Location: 753859
Implantable Lead Location: 753860
Implantable Lead Model: 4298
Implantable Lead Model: 5076
Implantable Pulse Generator Implant Date: 20170113

## 2019-04-17 ENCOUNTER — Telehealth (INDEPENDENT_AMBULATORY_CARE_PROVIDER_SITE_OTHER): Payer: Medicare HMO | Admitting: Cardiology

## 2019-04-17 ENCOUNTER — Encounter: Payer: Self-pay | Admitting: Cardiology

## 2019-04-17 ENCOUNTER — Other Ambulatory Visit: Payer: Self-pay

## 2019-04-17 VITALS — BP 112/76 | HR 66 | Ht 73.0 in | Wt 199.0 lb

## 2019-04-17 DIAGNOSIS — I350 Nonrheumatic aortic (valve) stenosis: Secondary | ICD-10-CM

## 2019-04-17 DIAGNOSIS — J90 Pleural effusion, not elsewhere classified: Secondary | ICD-10-CM | POA: Diagnosis not present

## 2019-04-17 DIAGNOSIS — N1832 Chronic kidney disease, stage 3b: Secondary | ICD-10-CM | POA: Diagnosis not present

## 2019-04-17 DIAGNOSIS — I472 Ventricular tachycardia, unspecified: Secondary | ICD-10-CM

## 2019-04-17 DIAGNOSIS — I2510 Atherosclerotic heart disease of native coronary artery without angina pectoris: Secondary | ICD-10-CM | POA: Diagnosis not present

## 2019-04-17 DIAGNOSIS — I480 Paroxysmal atrial fibrillation: Secondary | ICD-10-CM | POA: Diagnosis not present

## 2019-04-17 DIAGNOSIS — I5042 Chronic combined systolic (congestive) and diastolic (congestive) heart failure: Secondary | ICD-10-CM | POA: Diagnosis not present

## 2019-04-17 NOTE — Patient Instructions (Signed)
Medication Instructions:  Your physician recommends that you continue on your current medications as directed. Please refer to the Current Medication list given to you today.  *If you need a refill on your cardiac medications before your next appointment, please call your pharmacy*  Follow-Up: At [facility], you and your health needs are our priority.  As part of our continuing mission to provide you with exceptional heart care, we have created designated Provider Care Teams.  These Care Teams include your primary Cardiologist (physician) and Advanced Practice Providers (APPs -  Physician Assistants and Nurse Practitioners) who all work together to provide you with the care you need, when you need it.  Your next appointment:   1 year(s)  The format for your next appointment:   In Person  Provider:   [name], MD

## 2019-04-17 NOTE — Progress Notes (Signed)
Virtual Visit via Telephone Note   This visit type was conducted due to national recommendations for restrictions regarding the COVID-19 Pandemic (e.g. social distancing) in an effort to limit this patient's exposure and mitigate transmission in our community.  Due to his co-morbid illnesses, this patient is at least at moderate risk for complications without adequate follow up.  This format is felt to be most appropriate for this patient at this time.  The patient did not have access to video technology/had technical difficulties with video requiring transitioning to audio format only (telephone).  All issues noted in this document were discussed and addressed.  No physical exam could be performed with this format.  Please refer to the patient's chart for his  consent to telehealth for [facility].   Evaluation Performed:  Follow-up visit  This visit type was conducted due to national recommendations for restrictions regarding the COVID-19 Pandemic (e.g. social distancing).  This format is felt to be most appropriate for this patient at this time.  All issues noted in this document were discussed and addressed.  No physical exam was performed (except for noted visual exam findings with Video Visits).  Please refer to the patient's chart (MyChart message for video visits and phone note for telephone visits) for the patient's consent to telehealth for [facility].  Date:  04/17/2019   ID:  BRAINARD HIGHFILL, DOB [birth year], MRN [id number]  Patient Location:  Home  Provider location:   [place]  PCP:  [name], MD  Cardiologist:  [name], MD  Electrophysiologist:  Constance Haw, MD   Chief Complaint:  CHF, VT, CAD, HLD  History of Present Illness:    [name] is a 86 y.o. male who presents via audio/video conferencing for a telehealth visit today.    [name] is a 86 y.o. male who has history of hypertension, aortic stenosis status post AVR, chronic  combined systolic and diastolic CHF in the setting of A. fib with RVR, orthostatic hypotension, dilated cardiomyopathy EF 25-30%, status post CABG.  He does have paroxysmal atrial fibrillation and has been cardioverted in the past.  He had CRT-D upgrade on 04/22/15.  He is on mexiletine for VT.  He was recently hospitalized for systolic heart failure with aggressive diuresis and thoracentesis.  He had a wide-complex tachycardia during hospitalization at a rate of approximately 110 bpm.  He has had multiple episodes of ATP over the last few weeks. He saw Dr. Curt Bears in Dec 2020 and was started on Sotalol 45m BID.  He was reminded not to drive at that OMound    He is here today for followup and is doing well.  He denies any chest pain or pressure, SOB, DOE, PND, orthopnea, LE edema, dizziness, palpitations or syncope. He is compliant with his meds and is tolerating meds with no SE.    The patient does not have symptoms concerning for COVID-19 infection (fever, chills, cough, or new shortness of breath).    Prior CV studies:   The following studies were reviewed today:  none  Past Medical History:  Diagnosis Date  . Arthritis    "minor everywhere" (04/22/2015)  . Asthma    "a touch"  . Barrett esophagus   . CAD (coronary artery disease)    a. s/p 3 vvessel CABG with LIMA to LAD, SVG to diagonal 1, SVG to RCA 12/09.  . Cardiac resynchronization therapy defibrillator (CRT-D) in place   . Chronic anticoagulation 01/23/2013  . Chronic  combined systolic and diastolic CHF (congestive heart failure) (HCC)    dry weight 197-200lbs.  . CKD (chronic kidney disease) stage 3, GFR 30-59 ml/min 08/23/2014  . DCM (dilated cardiomyopathy) ([place])    initially concerned for TTP amyloidosis but Tc-PYP scan 06/2017 was not consistent with amyloid and SPEP/UPEP with no M spike. EF 20-25% by echo 2019  . Dyslipidemia 01/23/2013  . Essential hypertension 01/23/2013  . GERD (gastroesophageal reflux disease)   . GI  bleed 05/08/2017  . H/O: rheumatic fever   . Hepatitis 1957   "don't know what kind:  . History of hiatal hernia   . History of PFTs    a. Amiodarone started 5/16 >> PFTs w/ DLCO 6/16:  FEV1 72% predicted, FEV1/FVC 66%, DLCO 66% >> minimal reversible obstructive airways disease with mild diffusion defect (suggestive of emphysema but absence of hyperinflation inconsistent with dx)  . Hyperkalemia 08/23/2014  . Hyperthyroidism 05/26/2017  . Leg weakness 01/28/2017  . Multiple thyroid nodules 08/13/2017  . Orthostatic hypotension   . Persistent atrial fibrillation ([place]) 01/23/2013  . Pleural effusion on right 05/26/2017  . Pneumonia 08/2014   "dr thought I may have had a touch"  . Pre-diabetes   . Protein-calorie malnutrition, severe 05/27/2017  . S/P AVR (aortic valve replacement) 2009   a. severe AS s/p AVR with pericardial tissue valve 2009.  Marland Kitchen Shingles 08/23/2014  . VT (ventricular tachycardia) ([place]) 05/06/2017   Past Surgical History:  Procedure Laterality Date  . AORTIC VALVE REPLACEMENT  with 23-mm Magna Ease pericardial valve, model number   with 23-mm Magna Ease pericardial valve, model [id number], serial number [id number]   . APPENDECTOMY  1940s  . BI-VENTRICULAR IMPLANTABLE CARDIOVERTER DEFIBRILLATOR  (CRT-D)  04/22/2015  . CARDIAC CATHETERIZATION N/A 11/04/2014   Procedure: Left Heart Cath and Cors/Grafts Angiography;  Surgeon: Leonie Man, MD;  Location: [place] CV LAB;  Service: Cardiovascular;  Laterality: N/A;  . CARDIAC VALVE REPLACEMENT    . CARDIOVERSION N/A 01/27/2013   Procedure: CARDIOVERSION;  Surgeon: Sueanne Margarita, MD;  Location: [place];  Service: Cardiovascular;  Laterality: N/A;  . CARDIOVERSION N/A 08/25/2014   Procedure: CARDIOVERSION;  Surgeon: Sanda Klein, MD;  Location: [place] ENDOSCOPY;  Service: Cardiovascular;  Laterality: N/A;  . CARDIOVERSION N/A 05/28/2018   Procedure: CARDIOVERSION;  Surgeon: Pixie Casino, MD;  Location: [facility] ENDOSCOPY;   Service: Cardiovascular;  Laterality: N/A;  . CATARACT EXTRACTION W/ INTRAOCULAR LENS  IMPLANT, BILATERAL Bilateral   . CHEST TUBE INSERTION Right 07/26/2017   Procedure: INSERTION PLEURAL DRAINAGE CATHETER - RIGHT;  Surgeon: Ivin Poot, MD;  Location: [place];  Service: Thoracic;  Laterality: Right;  . CORONARY ARTERY BYPASS GRAFT  03/10/2008   x 3 Dr. Roxan Hockey  . EP IMPLANTABLE DEVICE N/A 04/22/2015   Procedure: BiV ICD Insertion CRT-D;  Surgeon: [name], MD;  Location: [place] CV LAB;  Service: Cardiovascular;  Laterality: N/A;  . ESOPHAGOGASTRODUODENOSCOPY (EGD) WITH ESOPHAGEAL DILATION  X1  . ESOPHAGOGASTRODUODENOSCOPY (EGD) WITH PROPOFOL N/A 05/09/2017   Procedure: ESOPHAGOGASTRODUODENOSCOPY (EGD) WITH PROPOFOL;  Surgeon: Carol Ada, MD;  Location: [place];  Service: Endoscopy;  Laterality: N/A;  . IR THORACENTESIS ASP PLEURAL SPACE W/IMG GUIDE  05/27/2017  . IR THORACENTESIS ASP PLEURAL SPACE W/IMG GUIDE  06/17/2017  . IR THORACENTESIS ASP PLEURAL SPACE W/IMG GUIDE  06/18/2017  . IR THORACENTESIS ASP PLEURAL SPACE W/IMG GUIDE  07/11/2017  . REMOVAL OF PLEURAL DRAINAGE CATHETER Right 09/13/2017   Procedure: REMOVAL OF PLEURAL DRAINAGE  CATHETER;  Surgeon: Ivin Poot, MD;  Location: [place];  Service: Thoracic;  Laterality: Right;  . RIGHT HEART CATH AND CORONARY/GRAFT ANGIOGRAPHY N/A 05/31/2017   Procedure: RIGHT HEART CATH AND CORONARY/GRAFT ANGIOGRAPHY;  Surgeon: Jolaine Artist, MD;  Location: [place] CV LAB;  Service: Cardiovascular;  Laterality: N/A;  . TONSILLECTOMY       Current Meds  Medication Sig  . acetaminophen (TYLENOL) 325 MG tablet Take 650 mg by mouth every 6 (six) hours as needed for mild pain.  Marland Kitchen albuterol (PROVENTIL HFA;VENTOLIN HFA) 108 (90 Base) MCG/ACT inhaler Inhale 1-2 puffs into the lungs every 6 (six) hours as needed for wheezing.   Marland Kitchen atorvastatin (LIPITOR) 20 MG tablet TAKE 1 TABLET EVERY DAY  AT  6  PM  . carvedilol (COREG) 25  MG tablet TAKE 1 TABLET TWICE DAILY (DOSE INCREASE)  . ELIQUIS 2.5 MG TABS tablet TAKE 1 TABLET TWICE DAILY  . famotidine (PEPCID) 40 MG tablet Take 40 mg by mouth daily.  . furosemide (LASIX) 40 MG tablet TAKE 1 TABLET TWICE DAILY  . hydrALAZINE (APRESOLINE) 25 MG tablet TAKE 1/2 TABLET TWICE DAILY BEFORE MEALS. Please keep upcoming appt in January with Dr. Radford Pax for future refills. Thank you  . isosorbide mononitrate (IMDUR) 30 MG 24 hr tablet TAKE 1 TABLET EVERY DAY  . mexiletine (MEXITIL) 150 MG capsule Take 2 capsules (300 mg total) by mouth 3 (three) times daily.  . Multiple Vitamin (MULTIVITAMIN WITH MINERALS) TABS tablet Take 1 tablet by mouth daily.   . Omega-3 Fatty Acids (FISH OIL) 1000 MG CAPS Take 2,000 mg by mouth 2 (two) times daily.  . sotalol (BETAPACE) 80 MG tablet Take 0.5 tablets (40 mg total) by mouth 2 (two) times daily.  . traMADol (ULTRAM) 50 MG tablet Take 1 tablet (50 mg total) by mouth every 12 (twelve) hours as needed for moderate pain or severe pain.     Allergies:   Novocain [procaine] and Amiodarone   Social History   Tobacco Use  . Smoking status: Never Smoker  . Smokeless tobacco: Never Used  Substance Use Topics  . Alcohol use: Not Currently    Comment: 04/22/2015 "nothing since the 1980s; never had a problem w/it"  . Drug use: No     Family Hx: The patient's family history includes Alzheimer's disease in his mother and another family member; COPD in his daughter and sister; Depression in his sister; Esophageal cancer in his sister; Heart disease in his sister; Hyperlipidemia in his sister; Hypertension in his sister.  ROS:   Please see the history of present illness.     All other systems reviewed and are negative.   Labs/Other Tests and Data Reviewed:    Recent Labs: 11/14/2018: B Natriuretic Peptide 313.2 11/29/2018: ALT 24 11/30/2018: Hemoglobin 14.2; Platelets 261 12/01/2018: BUN 48; Creatinine, Ser 2.98; Potassium 4.6; Sodium 129   Recent  Lipid Panel Lab Results  Component Value Date/Time   CHOL 126 07/30/2016 11:43 AM   CHOL 93 (L) 06/16/2014 09:14 AM   TRIG 154 (H) 07/30/2016 11:43 AM   TRIG 124 06/16/2014 09:14 AM   HDL 45 07/30/2016 11:43 AM   HDL 34 (L) 06/16/2014 09:14 AM   CHOLHDL 2.8 07/30/2016 11:43 AM   CHOLHDL 2.7 01/19/2015 10:05 AM   LDLCALC 50 07/30/2016 11:43 AM   LDLCALC 34 06/16/2014 09:14 AM    Wt Readings from Last 3 Encounters:  04/17/19 199 lb (90.3 kg)  04/01/19 204 lb 9.6 oz (92.8  kg)  03/24/19 209 lb (94.8 kg)     Objective:    Vital Signs:  BP 112/76   Pulse 66   Ht _0  (1.854 m)   Wt 199 lb (90.3 kg)   SpO2 97%   BMI 26.25 kg/m    CONSTITUTIONAL:  Well nourished, well developed male in no acute distress.  EYES: anicteric MOUTH: oral mucosa is pink RESPIRATORY: Normal respiratory effort, symmetric expansion CARDIOVASCULAR: No peripheral edema SKIN: No rash, lesions or ulcers MUSCULOSKELETAL: no digital cyanosis NEURO: Cranial Nerves II-XII grossly intact, moves all extremities PSYCH: Intact judgement and insight.  A&O x 3, Mood/affect appropriate   ASSESSMENT & PLAN:    1. Recurrent R Pleural effusion - Had Pleurex placed 4/19 after multiple thoracentesis - Effusions resolved and Pluerex removed 6/19 - No evidence of recurrence   2. Chronic systolic HF due to ICM s/p CRT-D - Echo 05/27/2017 LVEF 20-25% - Remains NYHA class 2b - denies any LE edema and SOB stable.  He only gets SOB when he goes out in the cold weather - weight stable (199lb in Aug in CHF clinic and weight the same today). - Continue lasix 40 mg BID - Continue coreg 21m BID, Hydralazine 12.526mBID, Imdur 307maily - Not on ACE/ARB/ARNI/MRA due to CKD   3. CAD s/p CABG/AVR 2009 - Cath 05/2017 showed three-vessel CAD with patent LIMA to the LAD, SVG to diagonal and SVG to the RCA. - denies any anginal sx - continue statin, BB and long acting nitrates - No ASA due to DOAC  4. VT - S/p ICD with  CRT-D upgrade in 2017 - followed in device clinic and with Dr. CamCurt Bears- found to have recurrent VT on recent device check and now on Mexiletine and Sotolol per EP  5. Paroxysmal AF - s/p DCCV in 2/20 - denies any palpitations - Continue Eliquis 2.5 mg BID (dosed for age>80 and creatinine>1.5). - denies any bleeding on DOAC - I will get a copy of his last CBC and BMET from nephrology  6.  Severe AS -s/p 8m37mgna Ease pericardial tissue valve in 2009 -echo 05/2017 showed stable AVR with mean AVG 10mm52mnd AVA 1.52cm2.  7. CKD 3-4 - [place] with [place]  8.  HLD -LDL goal is < 70 -he is getting his FLP with his PCP next week -continue atorvastatin 20mg 65my  COVID-19 Education: The signs and symptoms of COVID-19 were discussed with the patient and how to seek care for testing (follow up with PCP or arrange E-visit).  The importance of social distancing was discussed today.  Patient Risk:   After full review of this patient's clinical status, I feel that they are at least moderate risk at this time.  Time:   Today, I have spent 20 minutes on telemedicine discussing medical problems including CAD, CHF, PAF, VT, HLD, CKD.  We also reviewed the symptoms of COVID 19 and the ways to protect against contracting the virus with telehealth technology.  I spent an additional 5 minutes reviewing patient's chart including labs.  Medication Adjustments/Labs and Tests Ordered: Current medicines are reviewed at length with the patient today.  Concerns regarding medicines are outlined above.  Tests Ordered: No orders of the defined types were placed in this encounter.  Medication Changes: No orders of the defined types were placed in this encounter.   Disposition:  Follow up in 1 year(s)  Signed, Rawson Minix [name]1/11/2019 1:01 PM    [facility]

## 2019-04-29 ENCOUNTER — Telehealth: Payer: Self-pay | Admitting: Cardiology

## 2019-04-29 NOTE — Telephone Encounter (Signed)
Pt asking if he needs to increase Sotalol.   Pt not having nausea, when taking Sotalol, b/c they "fixed my reflux medications".   Pt aware I will have Dr. Curt Bears review and let him know recommendation by the end of the week. Patient verbalized understanding and agreeable to plan.

## 2019-04-29 NOTE — Telephone Encounter (Signed)
New message   Pt had questions about medication that was going to be increased. He couldn't remember the name of the medication.

## 2019-05-04 DIAGNOSIS — I130 Hypertensive heart and chronic kidney disease with heart failure and stage 1 through stage 4 chronic kidney disease, or unspecified chronic kidney disease: Secondary | ICD-10-CM | POA: Diagnosis not present

## 2019-05-04 DIAGNOSIS — I4891 Unspecified atrial fibrillation: Secondary | ICD-10-CM | POA: Diagnosis not present

## 2019-05-04 DIAGNOSIS — E7800 Pure hypercholesterolemia, unspecified: Secondary | ICD-10-CM | POA: Diagnosis not present

## 2019-05-04 DIAGNOSIS — Z1211 Encounter for screening for malignant neoplasm of colon: Secondary | ICD-10-CM | POA: Diagnosis not present

## 2019-05-04 DIAGNOSIS — I480 Paroxysmal atrial fibrillation: Secondary | ICD-10-CM | POA: Diagnosis not present

## 2019-05-04 DIAGNOSIS — I10 Essential (primary) hypertension: Secondary | ICD-10-CM | POA: Diagnosis not present

## 2019-05-04 DIAGNOSIS — I255 Ischemic cardiomyopathy: Secondary | ICD-10-CM | POA: Diagnosis not present

## 2019-05-04 DIAGNOSIS — Z7901 Long term (current) use of anticoagulants: Secondary | ICD-10-CM | POA: Diagnosis not present

## 2019-05-04 DIAGNOSIS — E1121 Type 2 diabetes mellitus with diabetic nephropathy: Secondary | ICD-10-CM | POA: Diagnosis not present

## 2019-05-04 DIAGNOSIS — I2510 Atherosclerotic heart disease of native coronary artery without angina pectoris: Secondary | ICD-10-CM | POA: Diagnosis not present

## 2019-05-04 DIAGNOSIS — I509 Heart failure, unspecified: Secondary | ICD-10-CM | POA: Diagnosis not present

## 2019-05-04 DIAGNOSIS — K2270 Barrett's esophagus without dysplasia: Secondary | ICD-10-CM | POA: Diagnosis not present

## 2019-05-04 DIAGNOSIS — Z954 Presence of other heart-valve replacement: Secondary | ICD-10-CM | POA: Diagnosis not present

## 2019-05-04 DIAGNOSIS — I472 Ventricular tachycardia: Secondary | ICD-10-CM | POA: Diagnosis not present

## 2019-05-04 DIAGNOSIS — N184 Chronic kidney disease, stage 4 (severe): Secondary | ICD-10-CM | POA: Diagnosis not present

## 2019-05-04 DIAGNOSIS — Z0000 Encounter for general adult medical examination without abnormal findings: Secondary | ICD-10-CM | POA: Diagnosis not present

## 2019-05-04 DIAGNOSIS — I5042 Chronic combined systolic (congestive) and diastolic (congestive) heart failure: Secondary | ICD-10-CM | POA: Diagnosis not present

## 2019-05-04 DIAGNOSIS — E1122 Type 2 diabetes mellitus with diabetic chronic kidney disease: Secondary | ICD-10-CM | POA: Diagnosis not present

## 2019-05-04 NOTE — Telephone Encounter (Signed)
Open In error.

## 2019-05-06 DIAGNOSIS — Z1211 Encounter for screening for malignant neoplasm of colon: Secondary | ICD-10-CM | POA: Diagnosis not present

## 2019-05-11 MED ORDER — SOTALOL HCL 80 MG PO TABS: 40.0000 mg | ORAL_TABLET | Freq: Two times a day (BID) | ORAL | 1 refills | Status: DC

## 2019-05-11 NOTE — Telephone Encounter (Signed)
Pt advised to continue current dosing per Dr. Curt Bears. Patient verbalized understanding and agreeable to plan.

## 2019-05-19 ENCOUNTER — Ambulatory Visit (INDEPENDENT_AMBULATORY_CARE_PROVIDER_SITE_OTHER): Payer: Medicare HMO | Admitting: *Deleted

## 2019-05-19 DIAGNOSIS — I420 Dilated cardiomyopathy: Secondary | ICD-10-CM

## 2019-05-19 LAB — CUP PACEART REMOTE DEVICE CHECK
Battery Remaining Longevity: 35 mo
Battery Voltage: 2.95 V
Brady Statistic AP VP Percent: 96 %
Brady Statistic AP VS Percent: 0.13 %
Brady Statistic AS VP Percent: 3.86 %
Brady Statistic AS VS Percent: 0.01 %
Brady Statistic RA Percent Paced: 95.37 %
Brady Statistic RV Percent Paced: 99.41 %
Date Time Interrogation Session: 20210209164228
HighPow Impedance: 75 Ohm
Implantable Lead Implant Date: 20170113
Implantable Lead Implant Date: 20170113
Implantable Lead Implant Date: 20170113
Implantable Lead Location: 753858
Implantable Lead Location: 753859
Implantable Lead Location: 753860
Implantable Lead Model: 4298
Implantable Lead Model: 5076
Implantable Pulse Generator Implant Date: 20170113
Lead Channel Impedance Value: 437 Ohm
Lead Channel Impedance Value: 437 Ohm
Lead Channel Impedance Value: 437 Ohm
Lead Channel Impedance Value: 456 Ohm
Lead Channel Impedance Value: 456 Ohm
Lead Channel Impedance Value: 494 Ohm
Lead Channel Impedance Value: 532 Ohm
Lead Channel Impedance Value: 570 Ohm
Lead Channel Impedance Value: 722 Ohm
Lead Channel Impedance Value: 817 Ohm
Lead Channel Impedance Value: 855 Ohm
Lead Channel Impedance Value: 893 Ohm
Lead Channel Impedance Value: 893 Ohm
Lead Channel Pacing Threshold Amplitude: 0.5 V
Lead Channel Pacing Threshold Amplitude: 0.75 V
Lead Channel Pacing Threshold Amplitude: 0.75 V
Lead Channel Pacing Threshold Pulse Width: 0.4 ms
Lead Channel Pacing Threshold Pulse Width: 0.4 ms
Lead Channel Pacing Threshold Pulse Width: 0.4 ms
Lead Channel Sensing Intrinsic Amplitude: 1.625 mV
Lead Channel Sensing Intrinsic Amplitude: 1.625 mV
Lead Channel Sensing Intrinsic Amplitude: 8.375 mV
Lead Channel Sensing Intrinsic Amplitude: 8.375 mV
Lead Channel Setting Pacing Amplitude: 1.5 V
Lead Channel Setting Pacing Amplitude: 1.75 V
Lead Channel Setting Pacing Amplitude: 2 V
Lead Channel Setting Pacing Pulse Width: 0.4 ms
Lead Channel Setting Pacing Pulse Width: 0.4 ms
Lead Channel Setting Sensing Sensitivity: 0.3 mV

## 2019-05-20 NOTE — Progress Notes (Signed)
ICD Remote

## 2019-06-02 DIAGNOSIS — N184 Chronic kidney disease, stage 4 (severe): Secondary | ICD-10-CM | POA: Diagnosis not present

## 2019-06-03 DIAGNOSIS — K219 Gastro-esophageal reflux disease without esophagitis: Secondary | ICD-10-CM | POA: Diagnosis not present

## 2019-06-10 DIAGNOSIS — E1122 Type 2 diabetes mellitus with diabetic chronic kidney disease: Secondary | ICD-10-CM | POA: Diagnosis not present

## 2019-06-10 DIAGNOSIS — E875 Hyperkalemia: Secondary | ICD-10-CM | POA: Diagnosis not present

## 2019-06-10 DIAGNOSIS — I5022 Chronic systolic (congestive) heart failure: Secondary | ICD-10-CM | POA: Diagnosis not present

## 2019-06-10 DIAGNOSIS — N1832 Chronic kidney disease, stage 3b: Secondary | ICD-10-CM | POA: Diagnosis not present

## 2019-06-10 DIAGNOSIS — K2270 Barrett's esophagus without dysplasia: Secondary | ICD-10-CM | POA: Diagnosis not present

## 2019-06-10 DIAGNOSIS — I129 Hypertensive chronic kidney disease with stage 1 through stage 4 chronic kidney disease, or unspecified chronic kidney disease: Secondary | ICD-10-CM | POA: Diagnosis not present

## 2019-06-22 ENCOUNTER — Other Ambulatory Visit: Payer: Self-pay | Admitting: Cardiology

## 2019-06-23 ENCOUNTER — Ambulatory Visit: Payer: Medicare HMO | Admitting: Cardiology

## 2019-06-23 ENCOUNTER — Other Ambulatory Visit: Payer: Self-pay | Admitting: Cardiology

## 2019-06-23 ENCOUNTER — Other Ambulatory Visit (HOSPITAL_COMMUNITY): Payer: Self-pay | Admitting: Internal Medicine

## 2019-07-01 ENCOUNTER — Other Ambulatory Visit: Payer: Self-pay

## 2019-07-01 MED ORDER — MEXILETINE HCL 150 MG PO CAPS: 300.0000 mg | ORAL_CAPSULE | Freq: Three times a day (TID) | ORAL | 2 refills | Status: DC

## 2019-07-11 IMAGING — CT CT ABD-PELV W/O CM
2 of 4 series · 16 of 46 positions shown, 18 images · non-contrast
Comparison: 08/17/2013 and MRI 09/07/2013

CLINICAL DATA: Weight loss unintentional with generalized abdominal
pain.

EXAM:
CT ABDOMEN AND PELVIS WITHOUT CONTRAST
TECHNIQUE: Multidetector CT imaging of the abdomen and pelvis was performed
following the standard protocol without IV contrast.

[Series 3: ap without · axial · non-contrast · 0.72mm/px · z∈[-503,-78]mm · 13 of 99 slices shown, 15 images]
[im 7/99  soft-tissue]
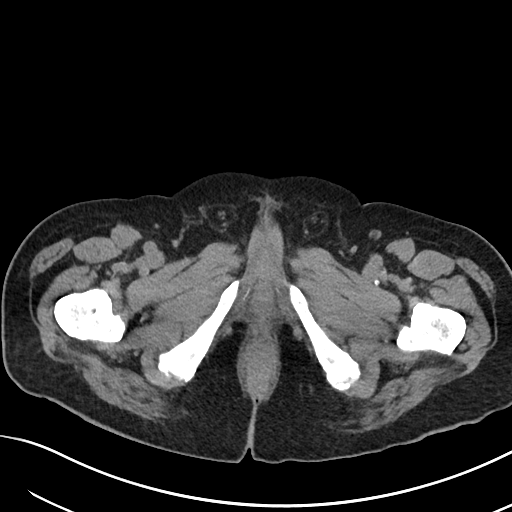
[im 7/99  bone]
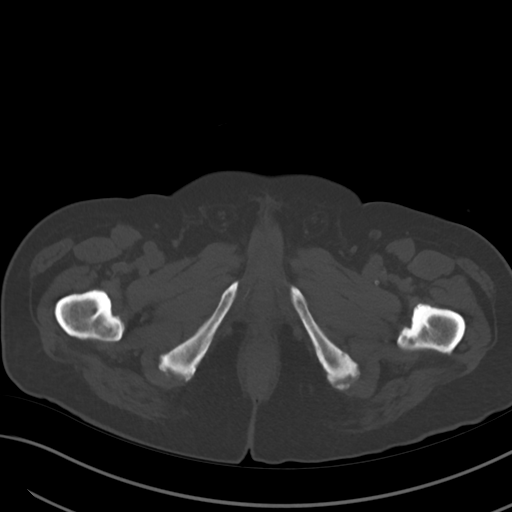
[im 13/99  soft-tissue]
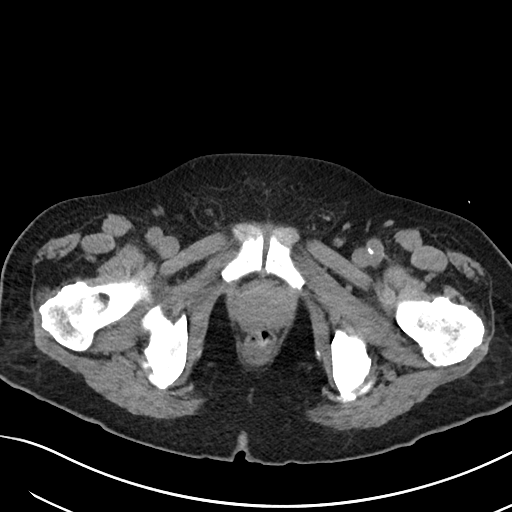
[im 19/99  soft-tissue]
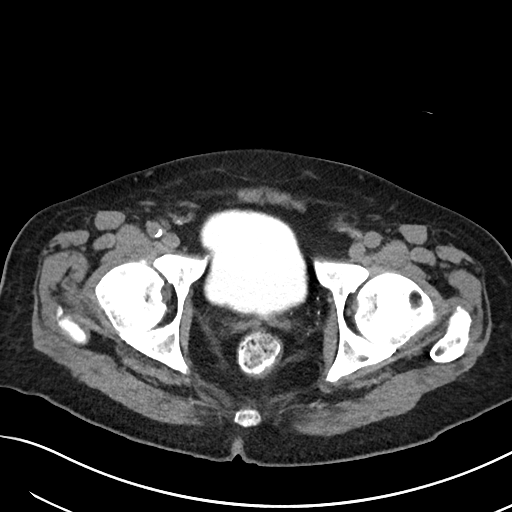
[im 31/99  soft-tissue]
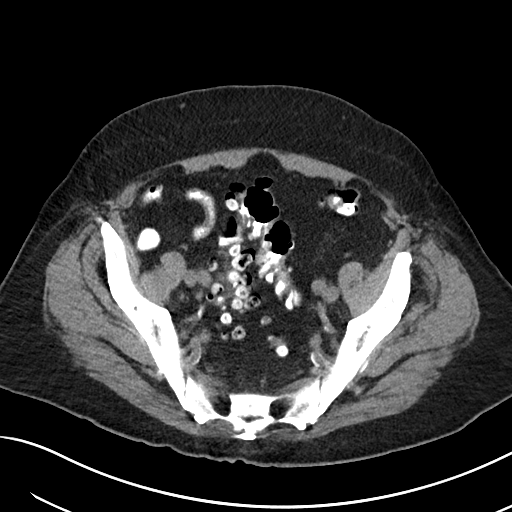
[im 37/99  soft-tissue]
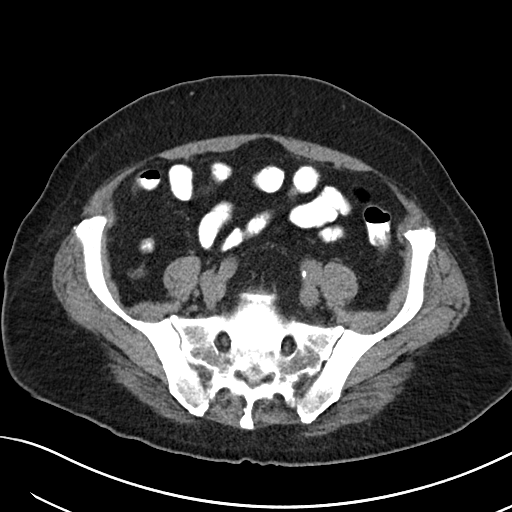
[im 43/99  soft-tissue]
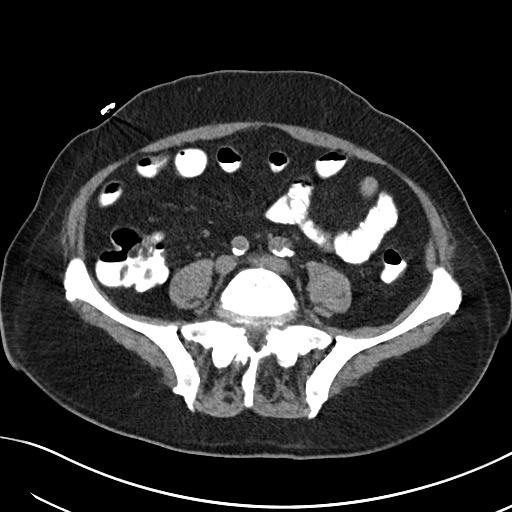
[im 50/99  soft-tissue]
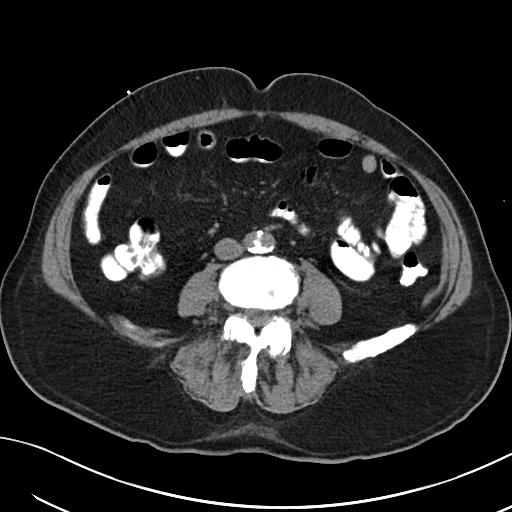
[im 56/99  soft-tissue]
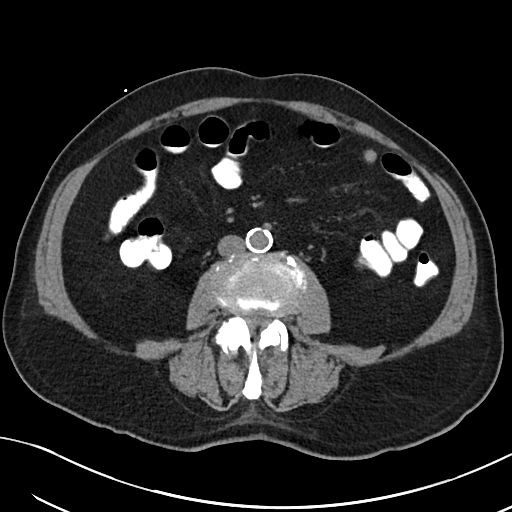
[im 62/99  soft-tissue]
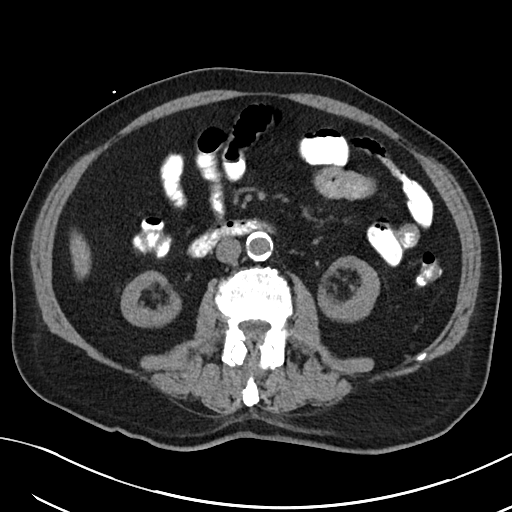
[im 62/99  bone]
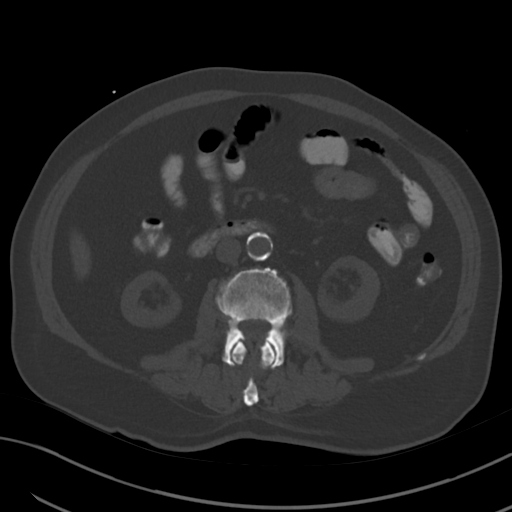
[im 68/99  soft-tissue]
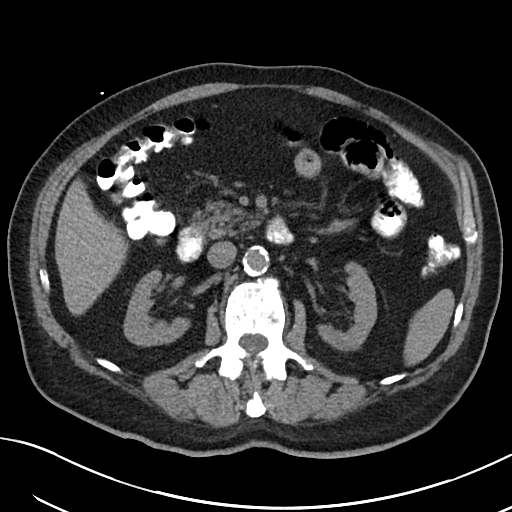
[im 80/99  soft-tissue]
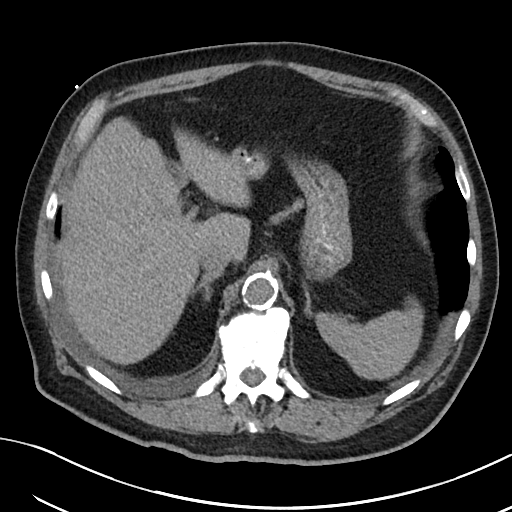
[im 86/99  soft-tissue]
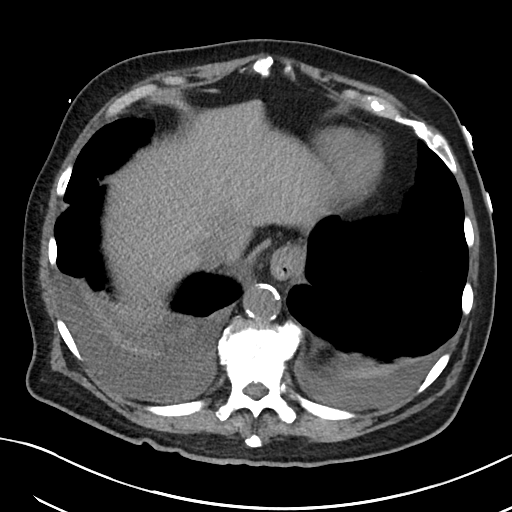
[im 92/99  soft-tissue]
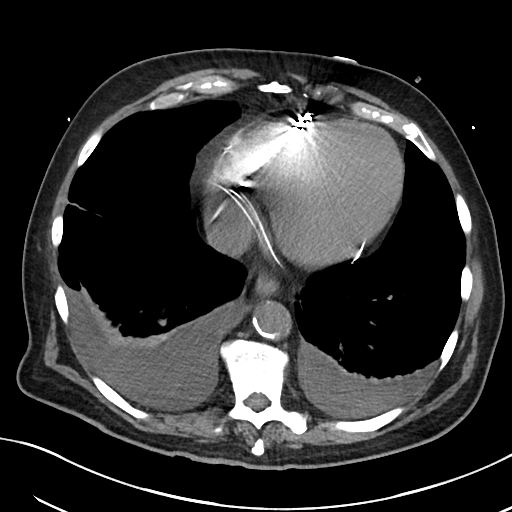

[Series 6: cor · coronal · 0.70mm/px · 3 of 94 slices shown]
[im 32/94  soft-tissue]
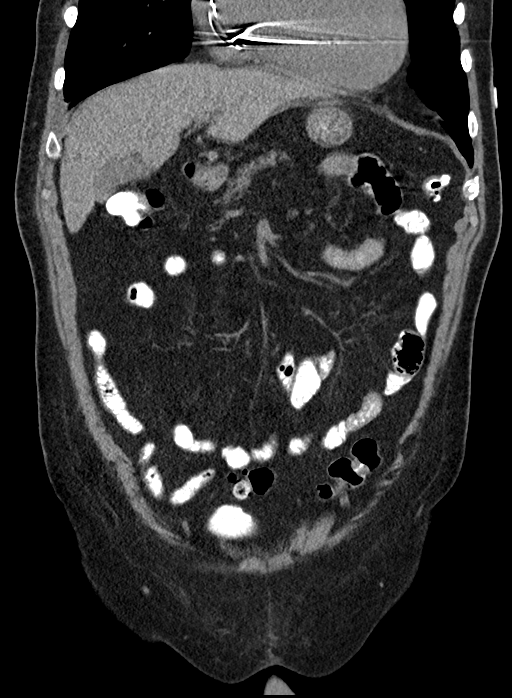
[im 42/94  soft-tissue]
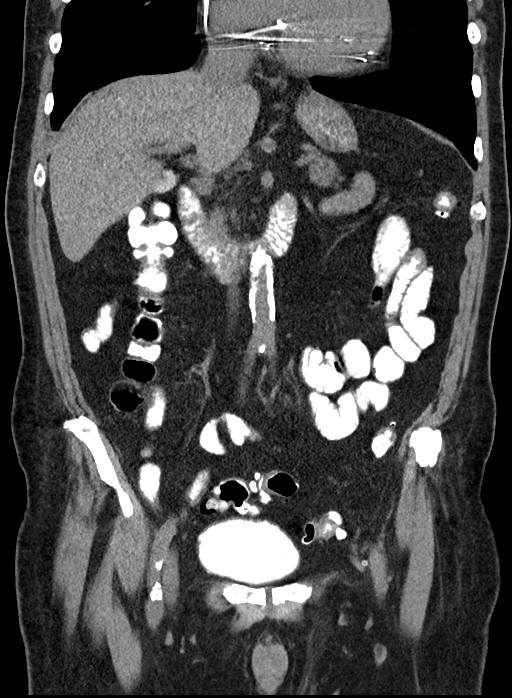
[im 52/94  soft-tissue]
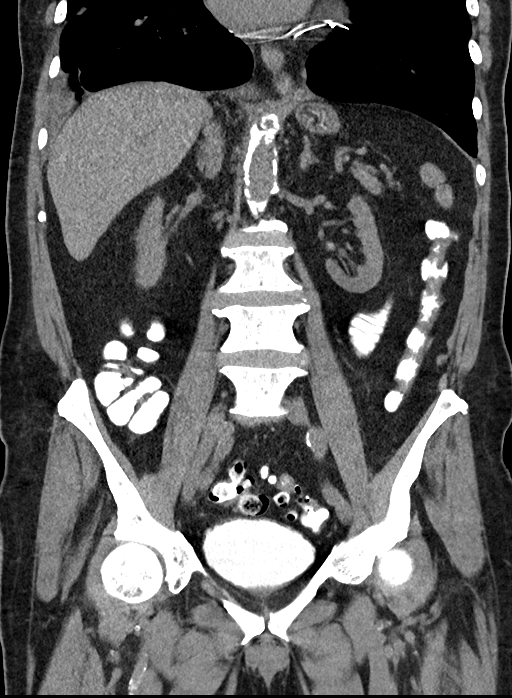

[16 of 46 positions shown; findings below may reference images not displayed]

FINDINGS: Lower chest: Mild emphysematous disease in the lung bases. Moderate
size right effusion and smaller left effusion with associated
bibasilar atelectasis. Cardiac pacer leads over the heart. Mild
calcification of the mitral valve annulus.

Hepatobiliary: Contrast within the gallbladder from recent chest CT.
Liver and biliary tree are within normal.

Pancreas: Normal.

Spleen: Normal.

Adrenals/Urinary Tract: Adrenal glands are normal. Kidneys are
normal in size without hydronephrosis or nephrolithiasis. Ureters
are within normal. Contrast fills the bladder.

Stomach/Bowel: Stomach and small bowel are within normal. Appendix
is normal. There is moderate diverticulosis of the colon most
prominent over the sigmoid colon. No active inflammation.

Vascular/Lymphatic: Moderate calcified plaque over the abdominal
aorta and iliac arteries. No aneurysm. No adenopathy.

Reproductive: Within normal.

Other: No free fluid or focal inflammatory change.

Musculoskeletal: Degenerate change of the spine and mild degenerate
change of the hips.
IMPRESSION: No acute findings in the abdomen/pelvis. No evidence of mass or
adenopathy.

Moderate size right pleural effusion and small left pleural effusion
with associated bibasilar atelectasis.

Colonic diverticulosis.

Aortic Atherosclerosis ([id number]).

## 2019-08-04 DIAGNOSIS — R197 Diarrhea, unspecified: Secondary | ICD-10-CM | POA: Diagnosis not present

## 2019-08-04 DIAGNOSIS — N184 Chronic kidney disease, stage 4 (severe): Secondary | ICD-10-CM | POA: Diagnosis not present

## 2019-08-04 DIAGNOSIS — R1030 Lower abdominal pain, unspecified: Secondary | ICD-10-CM | POA: Diagnosis not present

## 2019-08-05 ENCOUNTER — Other Ambulatory Visit: Payer: Self-pay | Admitting: Family Medicine

## 2019-08-05 ENCOUNTER — Ambulatory Visit
Admission: RE | Admit: 2019-08-05 | Discharge: 2019-08-05 | Disposition: A | Discharge status: Home / Self Care | Payer: Medicare HMO | Source: Ambulatory Visit | Attending: Family Medicine | Admitting: Family Medicine

## 2019-08-05 DIAGNOSIS — R1030 Lower abdominal pain, unspecified: Secondary | ICD-10-CM

## 2019-08-05 DIAGNOSIS — K5730 Diverticulosis of large intestine without perforation or abscess without bleeding: Secondary | ICD-10-CM | POA: Diagnosis not present

## 2019-08-06 ENCOUNTER — Ambulatory Visit (INDEPENDENT_AMBULATORY_CARE_PROVIDER_SITE_OTHER): Payer: Medicare HMO | Admitting: Cardiology

## 2019-08-06 ENCOUNTER — Encounter: Payer: Self-pay | Admitting: Cardiology

## 2019-08-06 ENCOUNTER — Other Ambulatory Visit: Payer: Self-pay

## 2019-08-06 VITALS — BP 132/68 | HR 67 | Ht 72.0 in | Wt 200.0 lb

## 2019-08-06 DIAGNOSIS — I5022 Chronic systolic (congestive) heart failure: Secondary | ICD-10-CM | POA: Diagnosis not present

## 2019-08-06 MED ORDER — SOTALOL HCL 80 MG PO TABS: 40.0000 mg | ORAL_TABLET | Freq: Two times a day (BID) | ORAL | 2 refills | Status: DC

## 2019-08-06 NOTE — Progress Notes (Signed)
Electrophysiology Office Note   Date:  08/06/2019   ID:  [name], DOB May 23, 1932, MRN [id number]  PCP:  [name], MD  Cardiologist:  [name] Primary Electrophysiologist: Zareen Jamison Meredith Leeds, MD    No chief complaint on file.    History of Present Illness: [name] is a 86 y.o. male who presents today for electrophysiology evaluation.   He has a history of hypertension, aortic stenosis status post AVR, chronic combined systolic and diastolic CHF in the setting of A. fib with RVR, orthostatic hypotension, dilated cardiomyopathy EF 25-30%, status post CABG, and paroxysmal atrial fibrillation.  He does have paroxysmal atrial fibrillation has been cardioverted in the past and is now on a fixed within with rhythm control via amiodarone. He had CRT-D upgrade on 04/22/15.  He was having VT below his detection limit.  Device limits were changed in the last visit and he was put on mexiletine.  He was recently hospitalized for systolic heart failure with aggressive diuresis and thoracentesis.  He had a wide-complex tachycardia during hospitalization at a rate of approximately 110 bpm.  He has had multiple episodes of ATP over the last few weeks.  Today, denies symptoms of palpitations, chest pain, shortness of breath, orthopnea, PND, lower extremity edema, claudication, dizziness, presyncope, syncope, bleeding, or neurologic sequela. The patient is tolerating medications without difficulties.  From a cardiac standpoint he is doing well.  He has no chest pain or shortness of breath.  He is noted no further ICD shocks since initiation of sotalol.  He is able to do all of his daily activities.  Unfortunately he is having some GI issues.  He had a CT scan that showed severe diverticuli.  Aside from that he has no main complaints.  Past Medical History:  Diagnosis Date  . Arthritis    "minor everywhere" (04/22/2015)  . Asthma    "a touch"  . Barrett esophagus   . CAD (coronary  artery disease)    a. s/p 3 vvessel CABG with LIMA to LAD, SVG to diagonal 1, SVG to RCA 12/09.  . Cardiac resynchronization therapy defibrillator (CRT-D) in place   . Chronic anticoagulation 01/23/2013  . Chronic combined systolic and diastolic CHF (congestive heart failure) (HCC)    dry weight 197-200lbs.  . CKD (chronic kidney disease) stage 3, GFR 30-59 ml/min 08/23/2014  . DCM (dilated cardiomyopathy) ([place])    initially concerned for TTP amyloidosis but Tc-PYP scan 06/2017 was not consistent with amyloid and SPEP/UPEP with no M spike. EF 20-25% by echo 2019  . Dyslipidemia 01/23/2013  . Essential hypertension 01/23/2013  . GERD (gastroesophageal reflux disease)   . GI bleed 05/08/2017  . H/O: rheumatic fever   . Hepatitis 1957   "don't know what kind:  . History of hiatal hernia   . History of PFTs    a. Amiodarone started 5/16 >> PFTs w/ DLCO 6/16:  FEV1 72% predicted, FEV1/FVC 66%, DLCO 66% >> minimal reversible obstructive airways disease with mild diffusion defect (suggestive of emphysema but absence of hyperinflation inconsistent with dx)  . Hyperkalemia 08/23/2014  . Hyperthyroidism 05/26/2017  . Leg weakness 01/28/2017  . Multiple thyroid nodules 08/13/2017  . Orthostatic hypotension   . Persistent atrial fibrillation ([place]) 01/23/2013  . Pleural effusion on right 05/26/2017  . Pneumonia 08/2014   "dr thought I may have had a touch"  . Pre-diabetes   . Protein-calorie malnutrition, severe 05/27/2017  . S/P AVR (aortic valve replacement) 2009   a.  severe AS s/p AVR with pericardial tissue valve 2009.  Marland Kitchen Shingles 08/23/2014  . VT (ventricular tachycardia) ([place]) 05/06/2017   Past Surgical History:  Procedure Laterality Date  . AORTIC VALVE REPLACEMENT  with 23-mm Magna Ease pericardial valve, model number   with 23-mm Magna Ease pericardial valve, model [id number], serial number [id number]   . APPENDECTOMY  1940s  . BI-VENTRICULAR IMPLANTABLE CARDIOVERTER DEFIBRILLATOR  (CRT-D)   04/22/2015  . CARDIAC CATHETERIZATION N/A 11/04/2014   Procedure: Left Heart Cath and Cors/Grafts Angiography;  Surgeon: Leonie Man, MD;  Location: [facility];  Service: Cardiovascular;  Laterality: N/A;  . CARDIAC VALVE REPLACEMENT    . CARDIOVERSION N/A 01/27/2013   Procedure: CARDIOVERSION;  Surgeon: Sueanne Margarita, MD;  Location: [facility];  Service: Cardiovascular;  Laterality: N/A;  . CARDIOVERSION N/A 08/25/2014   Procedure: CARDIOVERSION;  Surgeon: Sanda Klein, MD;  Location: [facility];  Service: Cardiovascular;  Laterality: N/A;  . CARDIOVERSION N/A 05/28/2018   Procedure: CARDIOVERSION;  Surgeon: Pixie Casino, MD;  Location[facility];  Service: Cardiovascular;  Laterality: N/A;  . CATARACT EXTRACTION W/ INTRAOCULAR LENS  IMPLANT, BILATERAL Bilateral   . CHEST TUBE INSERTION Right 07/26/2017   Procedure: INSERTION PLEURAL DRAINAGE CATHETER - RIGHT;  Surgeon: Ivin Poot, MD;  Location: [facility];  Service: Thoracic;  Laterality: Right;  . CORONARY ARTERY BYPASS GRAFT  03/10/2008   x 3 Dr. Roxan Hockey  . EP IMPLANTABLE DEVICE N/A 04/22/2015   Procedure: BiV ICD Insertion CRT-D;  Surgeon: Zaineb Nowaczyk Meredith Leeds, MD;  Location: [facility];  Service: Cardiovascular;  Laterality: N/A;  . ESOPHAGOGASTRODUODENOSCOPY (EGD) WITH ESOPHAGEAL DILATION  X1  . ESOPHAGOGASTRODUODENOSCOPY (EGD) WITH PROPOFOL N/A 05/09/2017   Procedure: ESOPHAGOGASTRODUODENOSCOPY (EGD) WITH PROPOFOL;  Surgeon: Carol Ada, MD;  Location: [facility];  Service: Endoscopy;  Laterality: N/A;  . IR THORACENTESIS ASP PLEURAL SPACE W/IMG GUIDE  05/27/2017  . IR THORACENTESIS ASP PLEURAL SPACE W/IMG GUIDE  06/17/2017  . IR THORACENTESIS ASP PLEURAL SPACE W/IMG GUIDE  06/18/2017  . IR THORACENTESIS ASP PLEURAL SPACE W/IMG GUIDE  07/11/2017  . REMOVAL OF PLEURAL DRAINAGE CATHETER Right 09/13/2017   Procedure: REMOVAL OF PLEURAL DRAINAGE CATHETER;  Surgeon: Ivin Poot, MD;  Location: [facility];   Service: Thoracic;  Laterality: Right;  . RIGHT HEART CATH AND CORONARY/GRAFT ANGIOGRAPHY N/A 05/31/2017   Procedure: RIGHT HEART CATH AND CORONARY/GRAFT ANGIOGRAPHY;  Surgeon: Jolaine Artist, MD;  Location: [facility];  Service: Cardiovascular;  Laterality: N/A;  . TONSILLECTOMY       Current Outpatient Medications  Medication Sig Dispense Refill  . acetaminophen (TYLENOL) 325 MG tablet Take 650 mg by mouth every 6 (six) hours as needed for mild pain.    Marland Kitchen albuterol (PROVENTIL HFA;VENTOLIN HFA) 108 (90 Base) MCG/ACT inhaler Inhale 1-2 puffs into the lungs every 6 (six) hours as needed for wheezing.     Marland Kitchen atorvastatin (LIPITOR) 20 MG tablet TAKE 1 TABLET EVERY DAY  AT  6  PM 90 tablet 2  . carvedilol (COREG) 25 MG tablet TAKE 1 TABLET TWICE DAILY (DOSE INCREASE) 180 tablet 3  . ELIQUIS 2.5 MG TABS tablet TAKE 1 TABLET TWICE DAILY 180 tablet 1  . famotidine (PEPCID) 40 MG tablet Take 40 mg by mouth daily.    . furosemide (LASIX) 40 MG tablet TAKE 1 TABLET TWICE DAILY 180 tablet 3  . hydrALAZINE (APRESOLINE) 25 MG tablet TAKE 1/2 TABLET TWICE DAILY BEFORE MEALS. PLEASE KEEP UPCOMING APPT  IN [place] WITH DR. Radford Pax FOR FUTURE REFILLS. THANK YOU 90 tablet 3  . isosorbide mononitrate (IMDUR) 30 MG 24 hr tablet TAKE 1 TABLET EVERY DAY 90 tablet 2  . mexiletine (MEXITIL) 150 MG capsule Take 2 capsules (300 mg total) by mouth 3 (three) times daily. 540 capsule 2  . Multiple Vitamin (MULTIVITAMIN WITH MINERALS) TABS tablet Take 1 tablet by mouth daily.     . Omega-3 Fatty Acids (FISH OIL) 1000 MG CAPS Take 2,000 mg by mouth 2 (two) times daily.    . pantoprazole (PROTONIX) 40 MG tablet Take 40 mg by mouth daily.    . sotalol (BETAPACE) 80 MG tablet Take 0.5 tablets (40 mg total) by mouth 2 (two) times daily. 90 tablet 2  . traMADol (ULTRAM) 50 MG tablet Take 1 tablet (50 mg total) by mouth every 12 (twelve) hours as needed for moderate pain or severe pain. 20 tablet 0   No current  facility-administered medications for this visit.    Allergies:   Novocain [procaine] and Amiodarone   Social History:  The patient  reports that he has never smoked. He has never used smokeless tobacco. He reports previous alcohol use. He reports that he does not use drugs.   Family History:  The patient's family history includes Alzheimer's disease in his mother and another family member; COPD in his daughter and sister; Depression in his sister; Esophageal cancer in his sister; Heart disease in his sister; Hyperlipidemia in his sister; Hypertension in his sister.   ROS:  Please see the history of present illness.   Otherwise, review of systems is positive for none.   All other systems are reviewed and negative.   PHYSICAL EXAM: VS:  BP 132/68   Pulse 67   Ht 6' (1.829 m)   Wt 200 lb (90.7 kg)   BMI 27.12 kg/m  , BMI Body mass index is 27.12 kg/m. GEN: Well nourished, well developed, in no acute distress  HEENT: normal  Neck: no JVD, carotid bruits, or masses Cardiac: RRR; no murmurs, rubs, or gallops,no edema  Respiratory:  clear to auscultation bilaterally, normal work of breathing GI: soft, nontender, nondistended, + BS MS: no deformity or atrophy  Skin: warm and dry, device site well healed Neuro:  Strength and sensation are intact Psych: euthymic mood, full affect  EKG:  EKG is ordered today. Personal review of the ekg ordered shows sinus rhythm, V paced  Personal review of the device interrogation today. Results in [facility]: 11/14/2018: B Natriuretic Peptide 313.2 11/29/2018: ALT 24 11/30/2018: Hemoglobin 14.2; Platelets 261 12/01/2018: BUN 48; Creatinine, Ser 2.98; Potassium 4.6; Sodium 129    Lipid Panel     Component Value Date/Time   CHOL 126 07/30/2016 1143   CHOL 93 (L) 06/16/2014 0914   TRIG 154 (H) 07/30/2016 1143   TRIG 124 06/16/2014 0914   HDL 45 07/30/2016 1143   HDL 34 (L) 06/16/2014 0914   CHOLHDL 2.8 07/30/2016 1143   CHOLHDL 2.7  01/19/2015 1005   VLDL 32 (H) 01/19/2015 1005   LDLCALC 50 07/30/2016 1143   LDLCALC 34 06/16/2014 0914     Wt Readings from Last 3 Encounters:  08/06/19 200 lb (90.7 kg)  04/17/19 199 lb (90.3 kg)  04/01/19 204 lb 9.6 oz (92.8 kg)      Other studies Reviewed: Additional studies/ records that were reviewed today include: TTE 05/29/17 Review of the above records today demonstrates:  - Left ventricle: The  cavity size was normal. Wall thickness was   increased in a pattern of mild LVH. Systolic function was   severely reduced. The estimated ejection fraction was in the   range of 20% to 25%. There is akinesis of the inferolateral,   inferior, and inferoseptal myocardium. Features are consistent   with a pseudonormal left ventricular filling pattern, with   concomitant abnormal relaxation and increased filling pressure   (grade 2 diastolic dysfunction). - Aortic valve: A pericardial tissue valve bioprosthesis was   present and functioning normally. Peak velocity (S): 226 cm/s.   Mean gradient (S): 10 mm Hg. Valve area (VTI): 1.52 cm^2. Valve   area (Vmax): 1.5 cm^2. Valve area (Vmean): 1.59 cm^2. - Mitral valve: Calcified annulus. Mildly thickened leaflets .   There was mild regurgitation. - Left atrium: The atrium was severely dilated. - Right ventricle: Pacer wire or catheter noted in right ventricle. - Pulmonary arteries: Systolic pressure was mildly increased. PA   peak pressure: 35 mm Hg (S).  RHC/LHC 05/31/17  Prox LAD to Mid LAD lesion is 50% stenosed.  Mid LAD to Dist LAD lesion is 25% stenosed.  1st Mrg lesion is 50% stenosed.  Prox RCA to Dist RCA lesion is 100% stenosed.  And is large.  Prox Graft to Mid Graft lesion is 30% stenosed.  And is large.  The flow in the graft is reversed.  There is competitive flow.  And is large.  There is competitive flow.  Ost 1st Diag to 1st Diag lesion is 90% stenosed.   Findings:  Ao = 145/67 (99)  RA = 6 RV =  70/9 PA = 69/30 (46) PCW = 26 v = 37 Fick cardiac output/index =4.7/2.3 PVR = 4.3 FA sat = 98% PA sat = 63%, 60%  Assessment:  1. 3v CAD with stable revascularization with patent LIMA-LAD, SVG-diagonal and SVG-RCA 2. 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 Normal cardiac output - no evidence of thyrotoxicosis heart disease or PH   ASSESSMENT AND PLAN:  1.  Chronic combined systolic and diastolic congestive heart failure due to ischemic cardiomyopathy: Status post Medtronic CRT-D implanted 04/22/2015.  Device functioning appropriately.  Currently on optimal medical therapy with carvedilol, hydralazine, Imdur.  Status post Medtronic CRT-D implanted 04/22/2015.  He is functioning appropriately.  2.  Persistent atrial fibrillation: Currently on Eliquis.  CHA2DS2-VASc of five.  3.  Ventricular tachycardia: Currently on sotalol and mexiletine.  No obvious episodes of ventricular tachycardia since initiation of sotalol.  We Eddie Payette continue without changes.  4.  Coronary artery disease status post CABG: No current chest pain  Current medicines are reviewed at length with the patient today.   The patient does not have concerns regarding his medicines.  The following changes were made today: None  Labs/ tests ordered today include:  Orders Placed This Encounter  Procedures  . EKG 12-Lead    Disposition:   FU with Dayyan Krist 6 months  Signed, all of Fontaine Hehl Meredith Leeds, MD  08/06/2019 12:04 PM     [facility] [address] [place] [address] [phone] (office) [phone] (fax)

## 2019-08-06 NOTE — Patient Instructions (Signed)
Medication Instructions:  Your physician recommends that you continue on your current medications as directed. Please refer to the Current Medication list given to you today.  *If you need a refill on your cardiac medications before your next appointment, please call your pharmacy*   Lab Work: None ordered If you have labs (blood work) drawn today and your tests are completely normal, you will receive your results only by: Marland Kitchen MyChart Message (if you have MyChart) OR . A paper copy in the mail If you have any lab test that is abnormal or we need to change your treatment, we will call you to review the results.   Testing/Procedures: None ordered   Follow-Up: Remote monitoring is used to monitor your Pacemaker of ICD from home. This monitoring reduces the number of office visits required to check your device to one time per year. It allows [place] to keep an eye on the functioning of your device to ensure it is working properly. You are scheduled for a device check from home on 08/18/2019. You may send your transmission at any time that day. If you have a wireless device, the transmission will be sent automatically. After your physician reviews your transmission, you will receive a postcard with your next transmission date.  At [facility], you and your health needs are our priority.  As part of our continuing mission to provide you with exceptional heart care, we have created designated Provider Care Teams.  These Care Teams include your primary Cardiologist (physician) and Advanced Practice Providers (APPs -  Physician Assistants and Nurse Practitioners) who all work together to provide you with the care you need, when you need it.  We recommend signing up for the patient portal called "MyChart".  Sign up information is provided on this After Visit Summary.  MyChart is used to connect with patients for Virtual Visits (Telemedicine).  Patients are able to view lab/test results, encounter notes,  upcoming appointments, etc.  Non-urgent messages can be sent to your provider as well.   To learn more about what you can do with MyChart, go to [email or web address].    Your next appointment:   6 month(s)  The format for your next appointment:   In Person  Provider:   You may see [name], MD or one of the following Advanced Practice Providers on your designated Care Team:    Chanetta Marshall, NP  Tommye Standard, PA-C  Legrand Como "Pastos" Devine, [place]    Thank you for choosing [facility]!!   Trinidad Curet, RN [phone]    Other Instructions

## 2019-08-08 DIAGNOSIS — K5792 Diverticulitis of intestine, part unspecified, without perforation or abscess without bleeding: Secondary | ICD-10-CM

## 2019-08-08 HISTORY — DX: Diverticulitis of intestine, part unspecified, without perforation or abscess without bleeding: K57.92

## 2019-08-10 DIAGNOSIS — R197 Diarrhea, unspecified: Secondary | ICD-10-CM | POA: Diagnosis not present

## 2019-08-18 ENCOUNTER — Ambulatory Visit (INDEPENDENT_AMBULATORY_CARE_PROVIDER_SITE_OTHER): Payer: Medicare HMO | Admitting: *Deleted

## 2019-08-18 DIAGNOSIS — I472 Ventricular tachycardia, unspecified: Secondary | ICD-10-CM

## 2019-08-18 DIAGNOSIS — I420 Dilated cardiomyopathy: Secondary | ICD-10-CM

## 2019-08-18 LAB — CUP PACEART REMOTE DEVICE CHECK
Battery Remaining Longevity: 32 mo
Battery Voltage: 2.95 V
Brady Statistic AP VP Percent: 94.52 %
Brady Statistic AP VS Percent: 0.16 %
Brady Statistic AS VP Percent: 5.27 %
Brady Statistic AS VS Percent: 0.04 %
Brady Statistic RA Percent Paced: 94.22 %
Brady Statistic RV Percent Paced: 99.5 %
Date Time Interrogation Session: 20210511022606
HighPow Impedance: 71 Ohm
Implantable Lead Implant Date: 20170113
Implantable Lead Implant Date: 20170113
Implantable Lead Implant Date: 20170113
Implantable Lead Location: 753858
Implantable Lead Location: 753859
Implantable Lead Location: 753860
Implantable Lead Model: 4298
Implantable Lead Model: 5076
Implantable Pulse Generator Implant Date: 20170113
Lead Channel Impedance Value: 1045 Ohm
Lead Channel Impedance Value: 437 Ohm
Lead Channel Impedance Value: 437 Ohm
Lead Channel Impedance Value: 456 Ohm
Lead Channel Impedance Value: 494 Ohm
Lead Channel Impedance Value: 494 Ohm
Lead Channel Impedance Value: 570 Ohm
Lead Channel Impedance Value: 589 Ohm
Lead Channel Impedance Value: 589 Ohm
Lead Channel Impedance Value: 912 Ohm
Lead Channel Impedance Value: 912 Ohm
Lead Channel Impedance Value: 950 Ohm
Lead Channel Impedance Value: 969 Ohm
Lead Channel Pacing Threshold Amplitude: 0.5 V
Lead Channel Pacing Threshold Amplitude: 0.75 V
Lead Channel Pacing Threshold Amplitude: 1 V
Lead Channel Pacing Threshold Pulse Width: 0.4 ms
Lead Channel Pacing Threshold Pulse Width: 0.4 ms
Lead Channel Pacing Threshold Pulse Width: 0.4 ms
Lead Channel Sensing Intrinsic Amplitude: 1.625 mV
Lead Channel Sensing Intrinsic Amplitude: 1.625 mV
Lead Channel Sensing Intrinsic Amplitude: 7.75 mV
Lead Channel Sensing Intrinsic Amplitude: 7.75 mV
Lead Channel Setting Pacing Amplitude: 1.5 V
Lead Channel Setting Pacing Amplitude: 2 V
Lead Channel Setting Pacing Amplitude: 2 V
Lead Channel Setting Pacing Pulse Width: 0.4 ms
Lead Channel Setting Pacing Pulse Width: 0.4 ms
Lead Channel Setting Sensing Sensitivity: 0.3 mV

## 2019-08-19 NOTE — Progress Notes (Signed)
Remote ICD transmission.   

## 2019-09-01 ENCOUNTER — Encounter (HOSPITAL_COMMUNITY): Payer: Self-pay | Admitting: Emergency Medicine

## 2019-09-01 ENCOUNTER — Other Ambulatory Visit: Payer: Self-pay

## 2019-09-01 ENCOUNTER — Inpatient Hospital Stay (HOSPITAL_COMMUNITY)
Admission: EM | Admit: 2019-09-01 | Discharge: 2019-09-23 | DRG: 391 | Disposition: A | Discharge status: Skilled Nursing Facility | Payer: Medicare HMO | Attending: Internal Medicine | Admitting: Internal Medicine

## 2019-09-01 DIAGNOSIS — R059 Cough, unspecified: Secondary | ICD-10-CM

## 2019-09-01 DIAGNOSIS — K5720 Diverticulitis of large intestine with perforation and abscess without bleeding: Principal | ICD-10-CM | POA: Diagnosis present

## 2019-09-01 DIAGNOSIS — K567 Ileus, unspecified: Secondary | ICD-10-CM | POA: Diagnosis not present

## 2019-09-01 DIAGNOSIS — R300 Dysuria: Secondary | ICD-10-CM | POA: Diagnosis not present

## 2019-09-01 DIAGNOSIS — I130 Hypertensive heart and chronic kidney disease with heart failure and stage 1 through stage 4 chronic kidney disease, or unspecified chronic kidney disease: Secondary | ICD-10-CM | POA: Diagnosis present

## 2019-09-01 DIAGNOSIS — R339 Retention of urine, unspecified: Secondary | ICD-10-CM | POA: Diagnosis present

## 2019-09-01 DIAGNOSIS — I2510 Atherosclerotic heart disease of native coronary artery without angina pectoris: Secondary | ICD-10-CM | POA: Diagnosis present

## 2019-09-01 DIAGNOSIS — Z888 Allergy status to other drugs, medicaments and biological substances status: Secondary | ICD-10-CM

## 2019-09-01 DIAGNOSIS — I2722 Pulmonary hypertension due to left heart disease: Secondary | ICD-10-CM | POA: Diagnosis present

## 2019-09-01 DIAGNOSIS — E871 Hypo-osmolality and hyponatremia: Secondary | ICD-10-CM | POA: Diagnosis not present

## 2019-09-01 DIAGNOSIS — Z66 Do not resuscitate: Secondary | ICD-10-CM | POA: Diagnosis present

## 2019-09-01 DIAGNOSIS — G253 Myoclonus: Secondary | ICD-10-CM | POA: Diagnosis not present

## 2019-09-01 DIAGNOSIS — N184 Chronic kidney disease, stage 4 (severe): Secondary | ICD-10-CM | POA: Diagnosis present

## 2019-09-01 DIAGNOSIS — E0590 Thyrotoxicosis, unspecified without thyrotoxic crisis or storm: Secondary | ICD-10-CM | POA: Diagnosis present

## 2019-09-01 DIAGNOSIS — E876 Hypokalemia: Secondary | ICD-10-CM | POA: Diagnosis not present

## 2019-09-01 DIAGNOSIS — Z20822 Contact with and (suspected) exposure to covid-19: Secondary | ICD-10-CM | POA: Diagnosis not present

## 2019-09-01 DIAGNOSIS — Z7901 Long term (current) use of anticoagulants: Secondary | ICD-10-CM

## 2019-09-01 DIAGNOSIS — N289 Disorder of kidney and ureter, unspecified: Secondary | ICD-10-CM | POA: Diagnosis not present

## 2019-09-01 DIAGNOSIS — Z79899 Other long term (current) drug therapy: Secondary | ICD-10-CM

## 2019-09-01 DIAGNOSIS — K219 Gastro-esophageal reflux disease without esophagitis: Secondary | ICD-10-CM | POA: Diagnosis present

## 2019-09-01 DIAGNOSIS — K5792 Diverticulitis of intestine, part unspecified, without perforation or abscess without bleeding: Secondary | ICD-10-CM | POA: Diagnosis present

## 2019-09-01 DIAGNOSIS — N200 Calculus of kidney: Secondary | ICD-10-CM | POA: Diagnosis not present

## 2019-09-01 DIAGNOSIS — R251 Tremor, unspecified: Secondary | ICD-10-CM | POA: Diagnosis not present

## 2019-09-01 DIAGNOSIS — D631 Anemia in chronic kidney disease: Secondary | ICD-10-CM | POA: Diagnosis present

## 2019-09-01 DIAGNOSIS — Z951 Presence of aortocoronary bypass graft: Secondary | ICD-10-CM

## 2019-09-01 DIAGNOSIS — K5732 Diverticulitis of large intestine without perforation or abscess without bleeding: Secondary | ICD-10-CM | POA: Diagnosis not present

## 2019-09-01 DIAGNOSIS — E861 Hypovolemia: Secondary | ICD-10-CM | POA: Diagnosis present

## 2019-09-01 DIAGNOSIS — R338 Other retention of urine: Secondary | ICD-10-CM | POA: Diagnosis present

## 2019-09-01 DIAGNOSIS — Z95810 Presence of automatic (implantable) cardiac defibrillator: Secondary | ICD-10-CM

## 2019-09-01 DIAGNOSIS — E860 Dehydration: Secondary | ICD-10-CM | POA: Diagnosis present

## 2019-09-01 DIAGNOSIS — Z825 Family history of asthma and other chronic lower respiratory diseases: Secondary | ICD-10-CM

## 2019-09-01 DIAGNOSIS — I480 Paroxysmal atrial fibrillation: Secondary | ICD-10-CM | POA: Diagnosis not present

## 2019-09-01 DIAGNOSIS — I4819 Other persistent atrial fibrillation: Secondary | ICD-10-CM | POA: Diagnosis present

## 2019-09-01 DIAGNOSIS — R5381 Other malaise: Secondary | ICD-10-CM | POA: Diagnosis not present

## 2019-09-01 DIAGNOSIS — I2723 Pulmonary hypertension due to lung diseases and hypoxia: Secondary | ICD-10-CM | POA: Diagnosis present

## 2019-09-01 DIAGNOSIS — I951 Orthostatic hypotension: Secondary | ICD-10-CM | POA: Diagnosis not present

## 2019-09-01 DIAGNOSIS — R140 Abdominal distension (gaseous): Secondary | ICD-10-CM

## 2019-09-01 DIAGNOSIS — Z4682 Encounter for fitting and adjustment of non-vascular catheter: Secondary | ICD-10-CM | POA: Diagnosis not present

## 2019-09-01 DIAGNOSIS — M2550 Pain in unspecified joint: Secondary | ICD-10-CM | POA: Diagnosis not present

## 2019-09-01 DIAGNOSIS — Z884 Allergy status to anesthetic agent status: Secondary | ICD-10-CM

## 2019-09-01 DIAGNOSIS — E872 Acidosis, unspecified: Secondary | ICD-10-CM | POA: Diagnosis present

## 2019-09-01 DIAGNOSIS — I420 Dilated cardiomyopathy: Secondary | ICD-10-CM | POA: Diagnosis not present

## 2019-09-01 DIAGNOSIS — I472 Ventricular tachycardia: Secondary | ICD-10-CM | POA: Diagnosis present

## 2019-09-01 DIAGNOSIS — J90 Pleural effusion, not elsewhere classified: Secondary | ICD-10-CM | POA: Diagnosis not present

## 2019-09-01 DIAGNOSIS — R110 Nausea: Secondary | ICD-10-CM | POA: Diagnosis not present

## 2019-09-01 DIAGNOSIS — R54 Age-related physical debility: Secondary | ICD-10-CM | POA: Diagnosis present

## 2019-09-01 DIAGNOSIS — J9811 Atelectasis: Secondary | ICD-10-CM | POA: Diagnosis not present

## 2019-09-01 DIAGNOSIS — R7303 Prediabetes: Secondary | ICD-10-CM | POA: Diagnosis present

## 2019-09-01 DIAGNOSIS — E43 Unspecified severe protein-calorie malnutrition: Secondary | ICD-10-CM | POA: Diagnosis not present

## 2019-09-01 DIAGNOSIS — Z6827 Body mass index (BMI) 27.0-27.9, adult: Secondary | ICD-10-CM

## 2019-09-01 DIAGNOSIS — Z7401 Bed confinement status: Secondary | ICD-10-CM | POA: Diagnosis not present

## 2019-09-01 DIAGNOSIS — J45909 Unspecified asthma, uncomplicated: Secondary | ICD-10-CM | POA: Diagnosis present

## 2019-09-01 DIAGNOSIS — R61 Generalized hyperhidrosis: Secondary | ICD-10-CM | POA: Diagnosis not present

## 2019-09-01 DIAGNOSIS — I255 Ischemic cardiomyopathy: Secondary | ICD-10-CM | POA: Diagnosis not present

## 2019-09-01 DIAGNOSIS — Z8249 Family history of ischemic heart disease and other diseases of the circulatory system: Secondary | ICD-10-CM

## 2019-09-01 DIAGNOSIS — E785 Hyperlipidemia, unspecified: Secondary | ICD-10-CM | POA: Diagnosis present

## 2019-09-01 DIAGNOSIS — Z4659 Encounter for fitting and adjustment of other gastrointestinal appliance and device: Secondary | ICD-10-CM

## 2019-09-01 DIAGNOSIS — M1990 Unspecified osteoarthritis, unspecified site: Secondary | ICD-10-CM | POA: Diagnosis present

## 2019-09-01 DIAGNOSIS — I10 Essential (primary) hypertension: Secondary | ICD-10-CM | POA: Diagnosis present

## 2019-09-01 DIAGNOSIS — R519 Headache, unspecified: Secondary | ICD-10-CM | POA: Diagnosis not present

## 2019-09-01 DIAGNOSIS — I5021 Acute systolic (congestive) heart failure: Secondary | ICD-10-CM | POA: Diagnosis not present

## 2019-09-01 DIAGNOSIS — R05 Cough: Secondary | ICD-10-CM | POA: Diagnosis not present

## 2019-09-01 DIAGNOSIS — I5042 Chronic combined systolic (congestive) and diastolic (congestive) heart failure: Secondary | ICD-10-CM | POA: Diagnosis present

## 2019-09-01 DIAGNOSIS — Z8701 Personal history of pneumonia (recurrent): Secondary | ICD-10-CM

## 2019-09-01 DIAGNOSIS — Z952 Presence of prosthetic heart valve: Secondary | ICD-10-CM

## 2019-09-01 DIAGNOSIS — R609 Edema, unspecified: Secondary | ICD-10-CM | POA: Diagnosis not present

## 2019-09-01 LAB — COMPREHENSIVE METABOLIC PANEL
ALT: 22 U/L (ref 0–44)
AST: 21 U/L (ref 15–41)
Albumin: 3.2 g/dL — ABNORMAL LOW (ref 3.5–5.0)
Alkaline Phosphatase: 49 U/L (ref 38–126)
Anion gap: 10 (ref 5–15)
BUN: 28 mg/dL — ABNORMAL HIGH (ref 8–23)
CO2: 22 mmol/L (ref 22–32)
Calcium: 8.8 mg/dL — ABNORMAL LOW (ref 8.9–10.3)
Chloride: 101 mmol/L (ref 98–111)
Creatinine, Ser: 2.52 mg/dL — ABNORMAL HIGH (ref 0.61–1.24)
GFR calc Af Amer: 26 mL/min — ABNORMAL LOW (ref >60)
GFR calc non Af Amer: 23 mL/min — ABNORMAL LOW (ref >60)
Glucose, Bld: 155 mg/dL — ABNORMAL HIGH (ref 70–99)
Potassium: 4.8 mmol/L (ref 3.5–5.1)
Sodium: 133 mmol/L — ABNORMAL LOW (ref 135–145)
Total Bilirubin: 1.2 mg/dL (ref 0.3–1.2)
Total Protein: 6.4 g/dL — ABNORMAL LOW (ref 6.5–8.1)

## 2019-09-01 LAB — CBC
HCT: 41.3 % (ref 39.0–52.0)
Hemoglobin: 13.3 g/dL (ref 13.0–17.0)
MCH: 30.6 pg (ref 26.0–34.0)
MCHC: 32.2 g/dL (ref 30.0–36.0)
MCV: 94.9 fL (ref 80.0–100.0)
Platelets: 288 10*3/uL (ref 150–400)
RBC: 4.35 MIL/uL (ref 4.22–5.81)
RDW: 14.5 % (ref 11.5–15.5)
WBC: 16.9 10*3/uL — ABNORMAL HIGH (ref 4.0–10.5)
nRBC: 0 % (ref 0.0–0.2)

## 2019-09-01 LAB — LIPASE, BLOOD: Lipase: 22 U/L (ref 11–51)

## 2019-09-01 MED ORDER — SODIUM CHLORIDE 0.9% FLUSH: 3.0000 mL | Freq: Once | INTRAVENOUS | Status: DC

## 2019-09-01 NOTE — ED Triage Notes (Signed)
Pt BIB [facility] from home, c/o groin pain, vomiting and pain with urination. Given 56mg fentanyl and 485mzofran pta.

## 2019-09-02 ENCOUNTER — Emergency Department (HOSPITAL_COMMUNITY): Payer: Medicare HMO

## 2019-09-02 ENCOUNTER — Encounter (HOSPITAL_COMMUNITY): Payer: Self-pay | Admitting: Internal Medicine

## 2019-09-02 DIAGNOSIS — E876 Hypokalemia: Secondary | ICD-10-CM | POA: Diagnosis not present

## 2019-09-02 DIAGNOSIS — R251 Tremor, unspecified: Secondary | ICD-10-CM | POA: Diagnosis not present

## 2019-09-02 DIAGNOSIS — R7303 Prediabetes: Secondary | ICD-10-CM | POA: Diagnosis present

## 2019-09-02 DIAGNOSIS — K567 Ileus, unspecified: Secondary | ICD-10-CM | POA: Diagnosis not present

## 2019-09-02 DIAGNOSIS — I255 Ischemic cardiomyopathy: Secondary | ICD-10-CM | POA: Diagnosis not present

## 2019-09-02 DIAGNOSIS — I951 Orthostatic hypotension: Secondary | ICD-10-CM | POA: Diagnosis not present

## 2019-09-02 DIAGNOSIS — E861 Hypovolemia: Secondary | ICD-10-CM | POA: Diagnosis present

## 2019-09-02 DIAGNOSIS — R609 Edema, unspecified: Secondary | ICD-10-CM | POA: Diagnosis not present

## 2019-09-02 DIAGNOSIS — I420 Dilated cardiomyopathy: Secondary | ICD-10-CM | POA: Diagnosis present

## 2019-09-02 DIAGNOSIS — I2722 Pulmonary hypertension due to left heart disease: Secondary | ICD-10-CM | POA: Diagnosis present

## 2019-09-02 DIAGNOSIS — K5720 Diverticulitis of large intestine with perforation and abscess without bleeding: Secondary | ICD-10-CM | POA: Diagnosis present

## 2019-09-02 DIAGNOSIS — Z20822 Contact with and (suspected) exposure to covid-19: Secondary | ICD-10-CM | POA: Diagnosis present

## 2019-09-02 DIAGNOSIS — R339 Retention of urine, unspecified: Secondary | ICD-10-CM | POA: Diagnosis present

## 2019-09-02 DIAGNOSIS — N184 Chronic kidney disease, stage 4 (severe): Secondary | ICD-10-CM | POA: Diagnosis present

## 2019-09-02 DIAGNOSIS — I130 Hypertensive heart and chronic kidney disease with heart failure and stage 1 through stage 4 chronic kidney disease, or unspecified chronic kidney disease: Secondary | ICD-10-CM | POA: Diagnosis present

## 2019-09-02 DIAGNOSIS — E860 Dehydration: Secondary | ICD-10-CM | POA: Diagnosis present

## 2019-09-02 DIAGNOSIS — E871 Hypo-osmolality and hyponatremia: Secondary | ICD-10-CM | POA: Diagnosis present

## 2019-09-02 DIAGNOSIS — I2723 Pulmonary hypertension due to lung diseases and hypoxia: Secondary | ICD-10-CM | POA: Diagnosis present

## 2019-09-02 DIAGNOSIS — Z66 Do not resuscitate: Secondary | ICD-10-CM | POA: Diagnosis present

## 2019-09-02 DIAGNOSIS — E43 Unspecified severe protein-calorie malnutrition: Secondary | ICD-10-CM | POA: Diagnosis not present

## 2019-09-02 DIAGNOSIS — I2510 Atherosclerotic heart disease of native coronary artery without angina pectoris: Secondary | ICD-10-CM | POA: Diagnosis present

## 2019-09-02 DIAGNOSIS — E872 Acidosis: Secondary | ICD-10-CM | POA: Diagnosis present

## 2019-09-02 DIAGNOSIS — I480 Paroxysmal atrial fibrillation: Secondary | ICD-10-CM | POA: Diagnosis not present

## 2019-09-02 DIAGNOSIS — K5792 Diverticulitis of intestine, part unspecified, without perforation or abscess without bleeding: Secondary | ICD-10-CM | POA: Diagnosis present

## 2019-09-02 DIAGNOSIS — I5021 Acute systolic (congestive) heart failure: Secondary | ICD-10-CM | POA: Diagnosis not present

## 2019-09-02 DIAGNOSIS — I10 Essential (primary) hypertension: Secondary | ICD-10-CM | POA: Diagnosis not present

## 2019-09-02 DIAGNOSIS — I472 Ventricular tachycardia: Secondary | ICD-10-CM | POA: Diagnosis present

## 2019-09-02 DIAGNOSIS — R338 Other retention of urine: Secondary | ICD-10-CM | POA: Diagnosis present

## 2019-09-02 DIAGNOSIS — I5042 Chronic combined systolic (congestive) and diastolic (congestive) heart failure: Secondary | ICD-10-CM | POA: Diagnosis present

## 2019-09-02 DIAGNOSIS — I4819 Other persistent atrial fibrillation: Secondary | ICD-10-CM | POA: Diagnosis present

## 2019-09-02 DIAGNOSIS — D631 Anemia in chronic kidney disease: Secondary | ICD-10-CM | POA: Diagnosis present

## 2019-09-02 DIAGNOSIS — E0590 Thyrotoxicosis, unspecified without thyrotoxic crisis or storm: Secondary | ICD-10-CM | POA: Diagnosis present

## 2019-09-02 LAB — CBC WITH DIFFERENTIAL/PLATELET
Abs Immature Granulocytes: 0.14 10*3/uL — ABNORMAL HIGH (ref 0.00–0.07)
Basophils Absolute: 0.1 10*3/uL (ref 0.0–0.1)
Basophils Relative: 0 %
Eosinophils Absolute: 0.1 10*3/uL (ref 0.0–0.5)
Eosinophils Relative: 0 %
HCT: 39.9 % (ref 39.0–52.0)
Hemoglobin: 13 g/dL (ref 13.0–17.0)
Immature Granulocytes: 1 %
Lymphocytes Relative: 9 %
Lymphs Abs: 1.9 10*3/uL (ref 0.7–4.0)
MCH: 31 pg (ref 26.0–34.0)
MCHC: 32.6 g/dL (ref 30.0–36.0)
MCV: 95.2 fL (ref 80.0–100.0)
Monocytes Absolute: 1.7 10*3/uL — ABNORMAL HIGH (ref 0.1–1.0)
Monocytes Relative: 8 %
Neutro Abs: 16.2 10*3/uL — ABNORMAL HIGH (ref 1.7–7.7)
Neutrophils Relative %: 82 %
Platelets: 227 10*3/uL (ref 150–400)
RBC: 4.19 MIL/uL — ABNORMAL LOW (ref 4.22–5.81)
RDW: 14.6 % (ref 11.5–15.5)
WBC: 20 10*3/uL — ABNORMAL HIGH (ref 4.0–10.5)
nRBC: 0 % (ref 0.0–0.2)

## 2019-09-02 LAB — LACTIC ACID, PLASMA
Lactic Acid, Venous: 2.2 mmol/L (ref 0.5–1.9)
Lactic Acid, Venous: 2.1 mmol/L (ref 0.5–1.9)
Lactic Acid, Venous: 1.9 mmol/L (ref 0.5–1.9)
Lactic Acid, Venous: 1.8 mmol/L (ref 0.5–1.9)

## 2019-09-02 LAB — URINALYSIS, ROUTINE W REFLEX MICROSCOPIC
Bilirubin Urine: NEGATIVE
Glucose, UA: NEGATIVE mg/dL
Hgb urine dipstick: NEGATIVE
Ketones, ur: NEGATIVE mg/dL
Leukocytes,Ua: NEGATIVE
Nitrite: NEGATIVE
Protein, ur: 30 mg/dL — AB
Specific Gravity, Urine: 1.02 (ref 1.005–1.030)
pH: 5 (ref 5.0–8.0)

## 2019-09-02 LAB — PROCALCITONIN: Procalcitonin: 3.38 ng/mL

## 2019-09-02 LAB — SARS CORONAVIRUS 2 BY RT PCR (HOSPITAL ORDER, PERFORMED IN ~~LOC~~ HOSPITAL LAB): SARS Coronavirus 2: NEGATIVE

## 2019-09-02 LAB — GLUCOSE, CAPILLARY
Glucose-Capillary: 76 mg/dL (ref 70–99)
Glucose-Capillary: 98 mg/dL (ref 70–99)

## 2019-09-02 MED ORDER — DOCUSATE SODIUM 100 MG PO CAPS
100.0000 mg | ORAL_CAPSULE | Freq: Two times a day (BID) | ORAL | Status: DC
  Administered 2019-09-02 – 2019-09-22 (×38): 100 mg via ORAL
  Filled 2019-09-02 (×41): qty 1

## 2019-09-02 MED ORDER — ONDANSETRON HCL 4 MG/2ML IJ SOLN
4.0000 mg | Freq: Four times a day (QID) | INTRAMUSCULAR | Status: DC | PRN
  Administered 2019-09-05: 4 mg via INTRAVENOUS
  Filled 2019-09-02: qty 2

## 2019-09-02 MED ORDER — SODIUM CHLORIDE 0.9 % IV BOLUS
1000.0000 mL | Freq: Once | INTRAVENOUS | Status: AC
  Administered 2019-09-02: 1000 mL via INTRAVENOUS

## 2019-09-02 MED ORDER — MORPHINE SULFATE (PF) 4 MG/ML IV SOLN
4.0000 mg | Freq: Once | INTRAVENOUS | Status: AC
  Administered 2019-09-02: 4 mg via INTRAVENOUS
  Filled 2019-09-02: qty 1

## 2019-09-02 MED ORDER — SODIUM CHLORIDE 0.9 % IV SOLN
2.0000 g | Freq: Once | INTRAVENOUS | Status: AC
  Administered 2019-09-02: 2 g via INTRAVENOUS
  Filled 2019-09-02: qty 2

## 2019-09-02 MED ORDER — ISOSORBIDE MONONITRATE ER 30 MG PO TB24
30.0000 mg | ORAL_TABLET | Freq: Every evening | ORAL | Status: DC
  Administered 2019-09-02 – 2019-09-23 (×22): 30 mg via ORAL
  Filled 2019-09-02 (×25): qty 1

## 2019-09-02 MED ORDER — ONDANSETRON HCL 4 MG/2ML IJ SOLN
4.0000 mg | Freq: Once | INTRAMUSCULAR | Status: AC
  Administered 2019-09-02: 4 mg via INTRAVENOUS
  Filled 2019-09-02: qty 2

## 2019-09-02 MED ORDER — ZOLPIDEM TARTRATE 5 MG PO TABS
5.0000 mg | ORAL_TABLET | Freq: Every evening | ORAL | Status: DC | PRN
  Administered 2019-09-06: 5 mg via ORAL
  Filled 2019-09-02: qty 1

## 2019-09-02 MED ORDER — MORPHINE SULFATE (PF) 2 MG/ML IV SOLN
2.0000 mg | INTRAVENOUS | Status: DC | PRN
  Administered 2019-09-02 – 2019-09-05 (×4): 2 mg via INTRAVENOUS
  Filled 2019-09-02 (×4): qty 1

## 2019-09-02 MED ORDER — ONDANSETRON HCL 4 MG PO TABS: 4.0000 mg | ORAL_TABLET | Freq: Four times a day (QID) | ORAL | Status: DC | PRN

## 2019-09-02 MED ORDER — PANTOPRAZOLE SODIUM 40 MG PO TBEC
40.0000 mg | DELAYED_RELEASE_TABLET | Freq: Every day | ORAL | Status: DC
  Administered 2019-09-02 – 2019-09-23 (×22): 40 mg via ORAL
  Filled 2019-09-02 (×2): qty 1
  Filled 2019-09-02: qty 2
  Filled 2019-09-02 (×19): qty 1

## 2019-09-02 MED ORDER — METRONIDAZOLE IN NACL 5-0.79 MG/ML-% IV SOLN
500.0000 mg | Freq: Once | INTRAVENOUS | Status: AC
  Administered 2019-09-02: 500 mg via INTRAVENOUS
  Filled 2019-09-02: qty 100

## 2019-09-02 MED ORDER — ATORVASTATIN CALCIUM 10 MG PO TABS
20.0000 mg | ORAL_TABLET | Freq: Every evening | ORAL | Status: DC
  Administered 2019-09-02 – 2019-09-23 (×22): 20 mg via ORAL
  Filled 2019-09-02 (×23): qty 2

## 2019-09-02 MED ORDER — BISACODYL 5 MG PO TBEC: 5.0000 mg | DELAYED_RELEASE_TABLET | Freq: Every day | ORAL | Status: DC | PRN

## 2019-09-02 MED ORDER — APIXABAN 2.5 MG PO TABS
2.5000 mg | ORAL_TABLET | Freq: Two times a day (BID) | ORAL | Status: DC
  Administered 2019-09-02 – 2019-09-11 (×19): 2.5 mg via ORAL
  Filled 2019-09-02 (×21): qty 1

## 2019-09-02 MED ORDER — MEXILETINE HCL 150 MG PO CAPS
300.0000 mg | ORAL_CAPSULE | Freq: Two times a day (BID) | ORAL | Status: DC
  Administered 2019-09-02 – 2019-09-19 (×35): 300 mg via ORAL
  Filled 2019-09-02 (×36): qty 2

## 2019-09-02 MED ORDER — HYDROCODONE-ACETAMINOPHEN 5-325 MG PO TABS
1.0000 | ORAL_TABLET | ORAL | Status: DC | PRN
  Administered 2019-09-02 – 2019-09-08 (×5): 2 via ORAL
  Administered 2019-09-08: 1 via ORAL
  Administered 2019-09-09: 2 via ORAL
  Administered 2019-09-11 – 2019-09-19 (×5): 1 via ORAL
  Administered 2019-09-20 – 2019-09-22 (×3): 2 via ORAL
  Filled 2019-09-02 (×2): qty 2
  Filled 2019-09-02: qty 1
  Filled 2019-09-02 (×2): qty 2
  Filled 2019-09-02: qty 1
  Filled 2019-09-02 (×4): qty 2
  Filled 2019-09-02: qty 1
  Filled 2019-09-02 (×5): qty 2

## 2019-09-02 MED ORDER — CARVEDILOL 25 MG PO TABS
25.0000 mg | ORAL_TABLET | Freq: Two times a day (BID) | ORAL | Status: DC
  Administered 2019-09-02 – 2019-09-23 (×41): 25 mg via ORAL
  Filled 2019-09-02 (×43): qty 1

## 2019-09-02 MED ORDER — ALBUTEROL SULFATE (2.5 MG/3ML) 0.083% IN NEBU: 3.0000 mL | INHALATION_SOLUTION | Freq: Four times a day (QID) | RESPIRATORY_TRACT | Status: DC | PRN

## 2019-09-02 MED ORDER — METRONIDAZOLE IN NACL 5-0.79 MG/ML-% IV SOLN
500.0000 mg | Freq: Three times a day (TID) | INTRAVENOUS | Status: DC
  Administered 2019-09-02 – 2019-09-22 (×61): 500 mg via INTRAVENOUS
  Filled 2019-09-02 (×61): qty 100

## 2019-09-02 MED ORDER — SODIUM CHLORIDE 0.9 % IV SOLN
2.0000 g | INTRAVENOUS | Status: DC
  Administered 2019-09-03 – 2019-09-05 (×3): 2 g via INTRAVENOUS
  Filled 2019-09-02 (×3): qty 2

## 2019-09-02 MED ORDER — ACETAMINOPHEN 650 MG RE SUPP: 650.0000 mg | Freq: Four times a day (QID) | RECTAL | Status: DC | PRN

## 2019-09-02 MED ORDER — HYDRALAZINE HCL 20 MG/ML IJ SOLN: 5.0000 mg | INTRAMUSCULAR | Status: DC | PRN

## 2019-09-02 MED ORDER — HYDRALAZINE HCL 25 MG PO TABS
12.5000 mg | ORAL_TABLET | Freq: Two times a day (BID) | ORAL | Status: DC
  Administered 2019-09-02 – 2019-09-10 (×17): 12.5 mg via ORAL
  Filled 2019-09-02 (×18): qty 1

## 2019-09-02 MED ORDER — SOTALOL HCL 80 MG PO TABS
40.0000 mg | ORAL_TABLET | Freq: Two times a day (BID) | ORAL | Status: DC
  Administered 2019-09-03 – 2019-09-07 (×9): 40 mg via ORAL
  Filled 2019-09-02 (×10): qty 0.5

## 2019-09-02 MED ORDER — FAMOTIDINE 20 MG PO TABS
40.0000 mg | ORAL_TABLET | Freq: Every evening | ORAL | Status: DC
  Administered 2019-09-02 – 2019-09-10 (×9): 40 mg via ORAL
  Filled 2019-09-02 (×10): qty 2

## 2019-09-02 MED ORDER — LACTATED RINGERS IV SOLN: INTRAVENOUS | Status: DC

## 2019-09-02 MED ORDER — FENTANYL CITRATE (PF) 100 MCG/2ML IJ SOLN: 50.0000 ug | INTRAMUSCULAR | Status: DC | PRN

## 2019-09-02 MED ORDER — IOHEXOL 9 MG/ML PO SOLN
ORAL | Status: AC
  Filled 2019-09-02: qty 1000

## 2019-09-02 MED ORDER — IOHEXOL 9 MG/ML PO SOLN
500.0000 mL | ORAL | Status: AC
  Administered 2019-09-02 (×2): 500 mL via ORAL

## 2019-09-02 MED ORDER — ACETAMINOPHEN 325 MG PO TABS: 650.0000 mg | ORAL_TABLET | Freq: Four times a day (QID) | ORAL | Status: DC | PRN

## 2019-09-02 MED ORDER — POLYETHYLENE GLYCOL 3350 17 G PO PACK: 17.0000 g | PACK | Freq: Every day | ORAL | Status: DC | PRN

## 2019-09-02 MED ORDER — SODIUM CHLORIDE 0.9% FLUSH
3.0000 mL | Freq: Two times a day (BID) | INTRAVENOUS | Status: DC
  Administered 2019-09-07 – 2019-09-20 (×17): 3 mL via INTRAVENOUS

## 2019-09-02 NOTE — H&P (Signed)
History and Physical    [name] ZJI:[id number] DOB: 12/19/1932 DOA: 09/01/2019  PCP: [name], MD Consultants:  Camnitz/Turner - cardiology; Michail Sermon - GI; Royce Macadamia - nephrology; [facility] - pulmonology; Cruzita Lederer - endocrinology  Patient coming from:  Home - lives with wife; NOK: Children, daughter, [phone]  Chief Complaint: Abdominal pain, n/v  HPI: [name] is a 86 y.o. male with medical history significant of TAVR; pre-diabetes; afib; hyperthyroidism; HTN; HLD; stage 4 CKD; chronic combined CHF; AICD placement; and CAD s/p CABG presenting with abdominal pain with n/v.  He started with abdominal pains, some SOB that he thinks was related to anxiety.  He recently had E coli enteritis a couple of weeks ago and thought maybe it was a recurrence.  It was treated with antibiotics.  His pain started severely yesterday, milder pain Monday.  Pain was in the suprapubic region.  Mild nausea and vomiting with EMS.  No fever.  He hasn't been able to void - last void was yesterday AM (he has chronic kidney and heart failure).  No diarrhea since his E coli infection.   ED Course:  Presented last night with abdominal pain, rebound/guarding.  No perf on CT, severe diverticulitis on non-contrast CT.  Still uncomfortable, WBC 20.     Review of Systems: As per HPI; otherwise review of systems reviewed and negative.   Ambulatory Status:  Ambulates without assistance  COVID Vaccine Status:  Complete  Past Medical History:  Diagnosis Date  . Arthritis    "minor everywhere" (04/22/2015)  . Asthma    "a touch"  . Barrett esophagus   . CAD (coronary artery disease)    a. s/p 3 vvessel CABG with LIMA to LAD, SVG to diagonal 1, SVG to RCA 12/09.  . Cardiac resynchronization therapy defibrillator (CRT-D) in place   . Chronic anticoagulation 01/23/2013  . Chronic combined systolic and diastolic CHF (congestive heart failure) (HCC)    dry weight 197-200lbs.  . CKD (chronic kidney  disease) stage 3, GFR 30-59 ml/min 08/23/2014  . DCM (dilated cardiomyopathy) ([place])    initially concerned for TTP amyloidosis but Tc-PYP scan 06/2017 was not consistent with amyloid and SPEP/UPEP with no M spike. EF 20-25% by echo 2019  . Diverticulitis 08/2019  . Dyslipidemia 01/23/2013  . Essential hypertension 01/23/2013  . GERD (gastroesophageal reflux disease)   . GI bleed 05/08/2017  . H/O: rheumatic fever   . Hepatitis 1957   "don't know what kind:  . History of hiatal hernia   . History of PFTs    a. Amiodarone started 5/16 >> PFTs w/ DLCO 6/16:  FEV1 72% predicted, FEV1/FVC 66%, DLCO 66% >> minimal reversible obstructive airways disease with mild diffusion defect (suggestive of emphysema but absence of hyperinflation inconsistent with dx)  . Hyperkalemia 08/23/2014  . Hyperthyroidism 05/26/2017  . Leg weakness 01/28/2017  . Multiple thyroid nodules 08/13/2017  . Orthostatic hypotension   . Persistent atrial fibrillation ([place]) 01/23/2013  . Pleural effusion on right 05/26/2017  . Pneumonia 08/2014   "dr thought I may have had a touch"  . Pre-diabetes   . Protein-calorie malnutrition, severe 05/27/2017  . S/P AVR (aortic valve replacement) 2009   a. severe AS s/p AVR with pericardial tissue valve 2009.  Marland Kitchen Shingles 08/23/2014  . VT (ventricular tachycardia) ([place]) 05/06/2017    Past Surgical History:  Procedure Laterality Date  . AORTIC VALVE REPLACEMENT  with 23-mm Magna Ease pericardial valve, model number   with 23-mm Magna Ease pericardial valve,  model [id number], serial number [id number]   . APPENDECTOMY  1940s  . BI-VENTRICULAR IMPLANTABLE CARDIOVERTER DEFIBRILLATOR  (CRT-D)  04/22/2015  . CARDIAC CATHETERIZATION N/A 11/04/2014   Procedure: Left Heart Cath and Cors/Grafts Angiography;  Surgeon: Leonie Man, MD;  Location: [place] [facility];  Service: Cardiovascular;  Laterality: N/A;  . CARDIAC VALVE REPLACEMENT    . CARDIOVERSION N/A 01/27/2013   Procedure: CARDIOVERSION;   Surgeon: Sueanne Margarita, MD;  Location: [place];  Service: Cardiovascular;  Laterality: N/A;  . CARDIOVERSION N/A 08/25/2014   Procedure: CARDIOVERSION;  Surgeon: Sanda Klein, MD;  Location: [place] [facility];  Service: Cardiovascular;  Laterality: N/A;  . CARDIOVERSION N/A 05/28/2018   Procedure: CARDIOVERSION;  Surgeon: Pixie Casino, MD;  Location: [facility];  Service: Cardiovascular;  Laterality: N/A;  . CATARACT EXTRACTION W/ INTRAOCULAR LENS  IMPLANT, BILATERAL Bilateral   . CHEST TUBE INSERTION Right 07/26/2017   Procedure: INSERTION PLEURAL DRAINAGE CATHETER - RIGHT;  Surgeon: Ivin Poot, MD;  Location: [place];  Service: Thoracic;  Laterality: Right;  . CORONARY ARTERY BYPASS GRAFT  03/10/2008   x 3 Dr. Roxan Hockey  . EP IMPLANTABLE DEVICE N/A 04/22/2015   Procedure: BiV ICD Insertion CRT-D;  Surgeon: [name], MD;  Location: [place] [facility];  Service: Cardiovascular;  Laterality: N/A;  . ESOPHAGOGASTRODUODENOSCOPY (EGD) WITH ESOPHAGEAL DILATION  X1  . ESOPHAGOGASTRODUODENOSCOPY (EGD) WITH PROPOFOL N/A 05/09/2017   Procedure: ESOPHAGOGASTRODUODENOSCOPY (EGD) WITH PROPOFOL;  Surgeon: Carol Ada, MD;  Location: [place];  Service: Endoscopy;  Laterality: N/A;  . IR THORACENTESIS ASP PLEURAL SPACE W/IMG GUIDE  05/27/2017  . IR THORACENTESIS ASP PLEURAL SPACE W/IMG GUIDE  06/17/2017  . IR THORACENTESIS ASP PLEURAL SPACE W/IMG GUIDE  06/18/2017  . IR THORACENTESIS ASP PLEURAL SPACE W/IMG GUIDE  07/11/2017  . REMOVAL OF PLEURAL DRAINAGE CATHETER Right 09/13/2017   Procedure: REMOVAL OF PLEURAL DRAINAGE CATHETER;  Surgeon: Ivin Poot, MD;  Location: [place];  Service: Thoracic;  Laterality: Right;  . RIGHT HEART CATH AND CORONARY/GRAFT ANGIOGRAPHY N/A 05/31/2017   Procedure: RIGHT HEART CATH AND CORONARY/GRAFT ANGIOGRAPHY;  Surgeon: Jolaine Artist, MD;  Location: [place] [facility];  Service: Cardiovascular;  Laterality: N/A;  . TONSILLECTOMY      Social  History   Socioeconomic History  . Marital status: Married    Spouse name: Not on file  . Number of children: Not on file  . Years of education: Not on file  . Highest education level: Not on file  Occupational History  . Not on file  Tobacco Use  . Smoking status: Never Smoker  . Smokeless tobacco: Never Used  Substance and Sexual Activity  . Alcohol use: Not Currently    Comment: 04/22/2015 "nothing since the 1980s; never had a problem w/it"  . Drug use: No  . Sexual activity: Not Currently  Other Topics Concern  . Not on file  Social History Narrative   Married, lives with spouse [name]   4 children, 2 boys and 2 girls > one daughter passed   OCCUPATION: retired, Pension scheme manager for [employer] Strain:   . Difficulty of Paying Living Expenses:   Food Insecurity:   . Worried About Charity fundraiser in the Last Year:   . Arboriculturist in the Last Year:   Transportation Needs:   . Film/video editor (Medical):   Marland Kitchen Lack of Transportation (Non-Medical):   Physical  Activity:   . Days of Exercise per Week:   . Minutes of Exercise per Session:   Stress:   . Feeling of Stress :   Social Connections:   . Frequency of Communication with Friends and Family:   . Frequency of Social Gatherings with Friends and Family:   . Attends Religious Services:   . Active Member of Clubs or Organizations:   . Attends Archivist Meetings:   Marland Kitchen Marital Status:   Intimate Partner Violence:   . Fear of Current or Ex-Partner:   . Emotionally Abused:   Marland Kitchen Physically Abused:   . Sexually Abused:     Allergies  Allergen Reactions  . Novocain [Procaine] Swelling    SWELLING REACTION UNSPECIFIED   . Amiodarone Nausea And Vomiting    Family History  Problem Relation Age of Onset  . Alzheimer's disease Mother   . COPD Daughter   . Alzheimer's disease Other   . COPD Sister   . Depression Sister   . Heart  disease Sister   . Hyperlipidemia Sister   . Hypertension Sister   . Esophageal cancer Sister        + smoker    Prior to Admission medications   Medication Sig Start Date End Date Taking? Authorizing Provider  acetaminophen (TYLENOL) 325 MG tablet Take 325-650 mg by mouth every 6 (six) hours as needed for mild pain.    Yes [provider]  albuterol (PROVENTIL HFA;VENTOLIN HFA) 108 (90 Base) MCG/ACT inhaler Inhale 1-2 puffs into the lungs every 6 (six) hours as needed for wheezing.    Yes [provider]  atorvastatin (LIPITOR) 20 MG tablet TAKE 1 TABLET EVERY DAY  AT  6  PM Patient taking differently: Take 20 mg by mouth every evening.  06/24/19  Yes Camnitz, Will Hassell Done, MD  carvedilol (COREG) 25 MG tablet TAKE 1 TABLET TWICE DAILY (DOSE INCREASE) Patient taking differently: Take 25 mg by mouth 2 (two) times daily with a meal.  04/14/19  Yes Camnitz, Will Hassell Done, MD  ELIQUIS 2.5 MG TABS tablet TAKE 1 TABLET TWICE DAILY Patient taking differently: Take 2.5 mg by mouth 2 (two) times daily.  06/23/19  Yes Bensimhon, Shaune Pascal, MD  famotidine (PEPCID) 40 MG tablet Take 40 mg by mouth every evening.  07/01/18  Yes [provider]  furosemide (LASIX) 40 MG tablet TAKE 1 TABLET TWICE DAILY Patient taking differently: Take 40 mg by mouth daily.  08/27/18  Yes Turner, Eber Hong, MD  hydrALAZINE (APRESOLINE) 25 MG tablet TAKE 1/2 TABLET TWICE DAILY BEFORE MEALS. PLEASE KEEP UPCOMING APPT IN JANUARY WITH DR. Radford Pax FOR FUTURE REFILLS. Christine YOU Patient taking differently: Take 12.5 mg by mouth 2 (two) times daily before a meal.  06/24/19  Yes Turner, Traci R, MD  isosorbide mononitrate (IMDUR) 30 MG 24 hr tablet TAKE 1 TABLET EVERY DAY Patient taking differently: Take 30 mg by mouth every evening.  06/24/19  Yes Camnitz, Ocie Doyne, MD  mexiletine (MEXITIL) 150 MG capsule Take 2 capsules (300 mg total) by mouth 3 (three) times daily. Patient taking differently: Take 300 mg by mouth  2 (two) times daily.  07/01/19  Yes Camnitz, Will Hassell Done, MD  Multiple Vitamin (MULTIVITAMIN WITH MINERALS) TABS tablet Take 1 tablet by mouth daily.    Yes [provider]  Omega-3 Fatty Acids (FISH OIL) 1000 MG CAPS Take 2,000 mg by mouth 2 (two) times daily.   Yes [provider]  pantoprazole (PROTONIX) 40 MG  tablet Take 40 mg by mouth daily. 07/11/19  Yes [provider]  sotalol (BETAPACE) 80 MG tablet Take 0.5 tablets (40 mg total) by mouth 2 (two) times daily. 08/06/19  Yes Camnitz, Will Hassell Done, MD  traMADol (ULTRAM) 50 MG tablet Take 1 tablet (50 mg total) by mouth every 12 (twelve) hours as needed for moderate pain or severe pain. 05/10/17  Yes Rai, Ripudeep K, MD  ciprofloxacin (CIPRO) 500 MG tablet Take 500 mg by mouth 2 (two) times daily. 09/01/19   [provider]    Physical Exam: Vitals:   09/02/19 1508 09/02/19 1512 09/02/19 1513 09/02/19 1818  BP:  (!) 136/53  (!) 109/48  Pulse:  76  76  Resp:  18  16  Temp:  99.2 F (37.3 C) 99.2 F (37.3 C) 98.3 F (36.8 C)  TempSrc:  Oral Oral Oral  SpO2:  100%  96%  Weight: 93.5 kg     Height: 6' (1.829 m)        . General:  Appears calm and comfortable and is NAD . Eyes:  PERRL, EOMI, normal lids, iris . ENT:  grossly normal hearing, lips & tongue, mmm; appropriate dentition . Neck:  no LAD, masses or thyromegaly . Cardiovascular:  RRR, no m/r/g. No LE edema.  Marland Kitchen Respiratory:   CTA bilaterally with no wheezes/rales/rhonchi.  Normal respiratory effort. . Abdomen:  soft, diffusely very TTP but especially suprapubic, ND, NABS . Back:   normal alignment, no CVAT . Skin:  no rash or induration seen on limited exam . Musculoskeletal:  grossly normal tone BUE/BLE, good ROM, no bony abnormality . Psychiatric:  grossly normal mood and affect, speech fluent and appropriate, AOx3 . Neurologic:  CN 2-12 grossly intact, moves all extremities in coordinated fashion    Radiological Exams on Admission: CT  ABDOMEN PELVIS WO CONTRAST  Result Date: 09/02/2019 CLINICAL DATA:  Diverticulitis suspected. Lower abdominal pain for 1 week. EXAM: CT ABDOMEN AND PELVIS WITHOUT CONTRAST TECHNIQUE: Multidetector CT imaging of the abdomen and pelvis was performed following the standard protocol without IV contrast. COMPARISON:  08/05/2019 FINDINGS: Lower chest:  Biventricular pacer.  Small sliding hiatal hernia. Hepatobiliary: No focal liver abnormality.No evidence of biliary obstruction or stone. Pancreas: Unremarkable. Spleen: Unremarkable. Adrenals/Urinary Tract: Negative adrenals. No hydronephrosis or stone. Unremarkable bladder. Stomach/Bowel: Extensive sigmoid diverticulosis with extensive stranding around a thickened mid sigmoid diverticulum. No pneumoperitoneum or fluid collection. No bowel obstruction pericecal inflammation. Small sliding hiatal hernia Vascular/Lymphatic: No acute vascular abnormality. Extensive atherosclerotic calcification of the aorta and iliacs. No mass or adenopathy. Reproductive:No acute finding Other: No ascites or pneumoperitoneum. Trace peritoneal fluid seen at the pelvis and at the right colonic gutter, considered reactive Musculoskeletal: No acute abnormalities. Advanced lumbar spine degeneration. IMPRESSION: 1. Sigmoid diverticulitis. Inflammation is extensive but there is no pneumoperitoneum or abscess. 2. Extensive sigmoid diverticulosis. Electronically Signed   By: Monte Fantasia M.D.   On: 09/02/2019 07:43    EKG: Independently reviewed.  Atrial sensed ventricularly paced rhythm with rate 65; no evidence of acute ischemia   Labs on Admission: I have personally reviewed the available labs and imaging studies at the time of the admission.  Pertinent labs:   Na++ 133 Glucose 155 BUN 28/Creatinine 2.52/GFR 23 - at/near baseline Albumin 3.2 Lactate 2.2, 2.1, 1.9, 1.8 WBC 16.9, 20.0  UA: 30 protein, rare bacteria   Assessment/Plan Principal Problem:    Diverticulitis Active Problems:   Essential hypertension   Dyslipidemia   Persistent atrial fibrillation (HCC)  Stage 4 chronic kidney disease (HCC)   Chronic combined systolic (congestive) and diastolic (congestive) heart failure (HCC)   Cardiac resynchronization therapy defibrillator (CRT-D) in place   Acute urinary retention   Diverticulitis -Patient's symptoms are c/w diverticulitis and his CT supports this as a diagnosis -His only SIRS criteria is leukocytosis (20.0) -His lactate was mildly but persistently elevated; this is likely related to diverticulitis and mild dehydration and has cleared with dehydration -For now, will give bowel rest, IVF, pain medication with morphine, nausea medication with Zofran, and treat with Cefepime/Flagyl for intraabdominal infection -If not improving, he may need CT and/or GI/surgery consultation -Will trend lactate  Acute urinary retention -Likely related to diverticulitis and mild dehydration -Unremarkable UA once he was able to void -This may have been exacerbating his pain -He has been able to void so will follow without intervention at this time  HTN -Continue Coreg, hydralazine, Sotalol  HLD -Continue Lipitor  Stage 4 CKD -Appears to be stable at this time  Afib -Rate controlled with Coreg, Mexiletine, Sotalol -Continue Eliquis  Chronic combined CHF with AICD -Euvolemic (on exam) to hypovolemic (inability to void, scant urine on bladder scan) so will provide gentle IV hydration and hold Lasix -Continue Imdur     Note: This patient has been tested and is negative for the novel coronavirus COVID-19.  DVT prophylaxis: Eliquis Code Status:  DNR - confirmed with patient/family Family Communication: Patient's daughter was present throughout evaluation. Disposition Plan:  The patient is from: home  Anticipated d/c is to: home without [facility] services  Anticipated d/c date will depend on clinical response to treatment, likely 2-3  days  Patient is currently: acutely ill Consults called: None Admission status:  Admit - It is my clinical opinion that admission to [facility] [place] is reasonable and necessary because of the expectation that this patient will require hospital care that crosses at least 2 midnights to treat this condition based on the medical complexity of the problems presented.  Given the aforementioned information, the predictability of an adverse outcome is felt to be significant.    Karmen Bongo MD [facility]   How to contact the [facility] Attending or Consulting provider [place] or covering provider during after hours [place], for this patient?  1. Check the care team in [facility] and look for a) attending/consulting [facility] provider listed and b) the [facility] team listed 2. Log into [email or web address] and use [facility] universal password to access. If you do not have the password, please contact the hospital operator. 3. Locate the [facility] provider you are looking for under [facility] and page to a number that you can be directly reached. 4. If you still have difficulty reaching the provider, please page the [facility] (Director on Call) for the Hospitalists listed on amion for assistance.   09/02/2019, 6:23 PM

## 2019-09-02 NOTE — Progress Notes (Signed)
Pharmacy Antibiotic Note  [name] is a 86 y.o. male admitted on 09/01/2019 with intra-abdominal infection.  Pharmacy has been consulted for Cefepime dosing. WBC elevated at 20. LA 2.2. SCR elevated.   Plan: -Cefepime 2 gm IV Q 24 hours  -Monitor CBC, renal fx, cultures and clinical progress   Height: 6' (182.9 cm) IBW/kg (Calculated) : 77.6  Temp (24hrs), Avg:98 F (36.7 C), Min:97.8 F (36.6 C), Max:98.2 F (36.8 C)  Recent Labs  Lab 09/01/19 1835 09/02/19 0445  WBC 16.9* 20.0*  CREATININE 2.52*  --   LATICACIDVEN  --  2.2*    CrCl cannot be calculated (Unknown ideal weight.).    Allergies  Allergen Reactions  . Novocain [Procaine] Swelling    SWELLING REACTION UNSPECIFIED   . Amiodarone Nausea And Vomiting    Antimicrobials this admission: Cefepime 5/26 >>   Dose adjustments this admission:   Microbiology results: 5/26 BCx:  5/26 UCx:     Thank you for allowing pharmacy to be a part of this patient's care.  Albertina Parr, PharmD., BCPS, BCCCP Clinical Pharmacist Clinical phone for 09/02/19 until 3:30pm: [phone] If after 3:30pm, please refer to [facility] for unit-specific pharmacist

## 2019-09-02 NOTE — ED Provider Notes (Signed)
[facility] EMERGENCY DEPARTMENT Provider Note   CSN: [id number] Arrival date & time: 09/01/19  1821   History Chief Complaint  Patient presents with  . Groin Pain    [name] is a 86 y.o. male.  The history is provided by the patient.  Groin Pain  He has history of hypertension, hyperlipidemia, atrial fibrillation anticoagulated on apixaban, chronic kidney disease, combined systolic and diastolic heart failure and comes in because of abdominal pain.  He started having mild, generalized pain throughout his abdomen and radiating to the groin with pain starting in the evening of May 24.  Pain got worse through the day of May 25.  He noted pain seemed to be worse when he urinated.  There was associated nausea and vomiting and pain did improve slightly after vomiting.  He has not had a bowel movement but he has passed a small amount of flatus and pain did improve slightly following passage of flatus.  He has noted some abdominal distention.  Pain is severe and he rates it at 10/10.  He has not taken anything for the pain.  Past Medical History:  Diagnosis Date  . Arthritis    "minor everywhere" (04/22/2015)  . Asthma    "a touch"  . Barrett esophagus   . CAD (coronary artery disease)    a. s/p 3 vvessel CABG with LIMA to LAD, SVG to diagonal 1, SVG to RCA 12/09.  . Cardiac resynchronization therapy defibrillator (CRT-D) in place   . Chronic anticoagulation 01/23/2013  . Chronic combined systolic and diastolic CHF (congestive heart failure) (HCC)    dry weight 197-200lbs.  . CKD (chronic kidney disease) stage 3, GFR 30-59 ml/min 08/23/2014  . DCM (dilated cardiomyopathy) ([place])    initially concerned for TTP amyloidosis but Tc-PYP scan 06/2017 was not consistent with amyloid and SPEP/UPEP with no M spike. EF 20-25% by echo 2019  . Dyslipidemia 01/23/2013  . Essential hypertension 01/23/2013  . GERD (gastroesophageal reflux disease)   . GI bleed 05/08/2017  .  H/O: rheumatic fever   . Hepatitis 1957   "don't know what kind:  . History of hiatal hernia   . History of PFTs    a. Amiodarone started 5/16 >> PFTs w/ DLCO 6/16:  FEV1 72% predicted, FEV1/FVC 66%, DLCO 66% >> minimal reversible obstructive airways disease with mild diffusion defect (suggestive of emphysema but absence of hyperinflation inconsistent with dx)  . Hyperkalemia 08/23/2014  . Hyperthyroidism 05/26/2017  . Leg weakness 01/28/2017  . Multiple thyroid nodules 08/13/2017  . Orthostatic hypotension   . Persistent atrial fibrillation ([place]) 01/23/2013  . Pleural effusion on right 05/26/2017  . Pneumonia 08/2014   "dr thought I may have had a touch"  . Pre-diabetes   . Protein-calorie malnutrition, severe 05/27/2017  . S/P AVR (aortic valve replacement) 2009   a. severe AS s/p AVR with pericardial tissue valve 2009.  Marland Kitchen Shingles 08/23/2014  . VT (ventricular tachycardia) ([facility]) 05/06/2017    Patient Active Problem List   Diagnosis Date Noted  . Lower extremity weakness 11/29/2018  . Multiple thyroid nodules 08/13/2017  . DCM (dilated cardiomyopathy) ([place]) 06/16/2017  . History of aortic valve replacement with bioprosthetic valve   . Cardiac resynchronization therapy defibrillator (CRT-D) in place   . Protein-calorie malnutrition, severe 05/27/2017  . Hyperthyroidism 05/26/2017  . Cough 05/26/2017  . Pleural effusion on right 05/26/2017  . Malnutrition of moderate degree 05/09/2017  . GI bleed 05/08/2017  . VT (ventricular tachycardia) (  [place]) 05/06/2017  . Dizziness 04/23/2017  . Weight loss 04/23/2017  . Leg weakness 01/28/2017  . Chronic combined systolic and diastolic heart failure ([place])   . Shingles 08/23/2014  . CKD (chronic kidney disease) stage 3, GFR 30-59 ml/min 08/23/2014  . Hyperkalemia 08/23/2014  . Essential hypertension 01/23/2013  . Dyslipidemia 01/23/2013  . Coronary artery disease 01/23/2013  . Persistent atrial fibrillation ([place]) 01/23/2013  . Chronic  anticoagulation 01/23/2013    Past Surgical History:  Procedure Laterality Date  . AORTIC VALVE REPLACEMENT  with 23-mm Magna Ease pericardial valve, model number   with 23-mm Magna Ease pericardial valve, model number3300TFX, serial number [id number]   . APPENDECTOMY  1940s  . BI-VENTRICULAR IMPLANTABLE CARDIOVERTER DEFIBRILLATOR  (CRT-D)  04/22/2015  . CARDIAC CATHETERIZATION N/A 11/04/2014   Procedure: Left Heart Cath and Cors/Grafts Angiography;  Surgeon: Leonie Man, MD;  Location: [facility];  Service: Cardiovascular;  Laterality: N/A;  . CARDIAC VALVE REPLACEMENT    . CARDIOVERSION N/A 01/27/2013   Procedure: CARDIOVERSION;  Surgeon: Sueanne Margarita, MD;  Location: [place];  Service: Cardiovascular;  Laterality: N/A;  . CARDIOVERSION N/A 08/25/2014   Procedure: CARDIOVERSION;  Surgeon: Sanda Klein, MD;  Location: [facility];  Service: Cardiovascular;  Laterality: N/A;  . CARDIOVERSION N/A 05/28/2018   Procedure: CARDIOVERSION;  Surgeon: Pixie Casino, MD;  Location: [facility];  Service: Cardiovascular;  Laterality: N/A;  . CATARACT EXTRACTION W/ INTRAOCULAR LENS  IMPLANT, BILATERAL Bilateral   . CHEST TUBE INSERTION Right 07/26/2017   Procedure: INSERTION PLEURAL DRAINAGE CATHETER - RIGHT;  Surgeon: Ivin Poot, MD;  Location: [facility];  Service: Thoracic;  Laterality: Right;  . CORONARY ARTERY BYPASS GRAFT  03/10/2008   x 3 Dr. Roxan Hockey  . EP IMPLANTABLE DEVICE N/A 04/22/2015   Procedure: BiV ICD Insertion CRT-D;  Surgeon: [name], MD;  Location: [facility];  Service: Cardiovascular;  Laterality: N/A;  . ESOPHAGOGASTRODUODENOSCOPY (EGD) WITH ESOPHAGEAL DILATION  X1  . ESOPHAGOGASTRODUODENOSCOPY (EGD) WITH PROPOFOL N/A 05/09/2017   Procedure: ESOPHAGOGASTRODUODENOSCOPY (EGD) WITH PROPOFOL;  Surgeon: Carol Ada, MD;  Location: [facility];  Service: Endoscopy;  Laterality: N/A;  . IR THORACENTESIS ASP PLEURAL SPACE W/IMG GUIDE  05/27/2017   . IR THORACENTESIS ASP PLEURAL SPACE W/IMG GUIDE  06/17/2017  . IR THORACENTESIS ASP PLEURAL SPACE W/IMG GUIDE  06/18/2017  . IR THORACENTESIS ASP PLEURAL SPACE W/IMG GUIDE  07/11/2017  . REMOVAL OF PLEURAL DRAINAGE CATHETER Right 09/13/2017   Procedure: REMOVAL OF PLEURAL DRAINAGE CATHETER;  Surgeon: Ivin Poot, MD;  Location: [facility];  Service: Thoracic;  Laterality: Right;  . RIGHT HEART CATH AND CORONARY/GRAFT ANGIOGRAPHY N/A 05/31/2017   Procedure: RIGHT HEART CATH AND CORONARY/GRAFT ANGIOGRAPHY;  Surgeon: Jolaine Artist, MD;  Location: [facility];  Service: Cardiovascular;  Laterality: N/A;  . TONSILLECTOMY         Family History  Problem Relation Age of Onset  . Alzheimer's disease Mother   . COPD Daughter   . Alzheimer's disease Other   . COPD Sister   . Depression Sister   . Heart disease Sister   . Hyperlipidemia Sister   . Hypertension Sister   . Esophageal cancer Sister        + smoker    Social History   Tobacco Use  . Smoking status: Never Smoker  . Smokeless tobacco: Never Used  Substance Use Topics  . Alcohol use: Not Currently    Comment: 04/22/2015 "nothing since  the 1980s; never had a problem w/it"  . Drug use: No    Home Medications Prior to Admission medications   Medication Sig Start Date End Date Taking? Authorizing Provider  acetaminophen (TYLENOL) 325 MG tablet Take 650 mg by mouth every 6 (six) hours as needed for mild pain.    [provider]  albuterol (PROVENTIL HFA;VENTOLIN HFA) 108 (90 Base) MCG/ACT inhaler Inhale 1-2 puffs into the lungs every 6 (six) hours as needed for wheezing.     [provider]  atorvastatin (LIPITOR) 20 MG tablet TAKE 1 TABLET EVERY DAY  AT  6  PM 06/24/19   Camnitz, Will Hassell Done, MD  carvedilol (COREG) 25 MG tablet TAKE 1 TABLET TWICE DAILY (DOSE INCREASE) 04/14/19   Camnitz, Ocie Doyne, MD  ELIQUIS 2.5 MG TABS tablet TAKE 1 TABLET TWICE DAILY 06/23/19   Bensimhon, Shaune Pascal, MD  famotidine  (PEPCID) 40 MG tablet Take 40 mg by mouth daily. 07/01/18   [provider]  furosemide (LASIX) 40 MG tablet TAKE 1 TABLET TWICE DAILY 08/27/18   Sueanne Margarita, MD  hydrALAZINE (APRESOLINE) 25 MG tablet TAKE 1/2 TABLET TWICE DAILY BEFORE MEALS. PLEASE KEEP UPCOMING APPT IN JANUARY WITH DR. Radford Pax FOR FUTURE REFILLS. [place] YOU 06/24/19   Sueanne Margarita, MD  isosorbide mononitrate (IMDUR) 30 MG 24 hr tablet TAKE 1 TABLET EVERY DAY 06/24/19   Camnitz, Ocie Doyne, MD  mexiletine (MEXITIL) 150 MG capsule Take 2 capsules (300 mg total) by mouth 3 (three) times daily. 07/01/19   Camnitz, Ocie Doyne, MD  Multiple Vitamin (MULTIVITAMIN WITH MINERALS) TABS tablet Take 1 tablet by mouth daily.     [provider]  Omega-3 Fatty Acids (FISH OIL) 1000 MG CAPS Take 2,000 mg by mouth 2 (two) times daily.    [provider]  pantoprazole (PROTONIX) 40 MG tablet Take 40 mg by mouth daily. 07/11/19   [provider]  sotalol (BETAPACE) 80 MG tablet Take 0.5 tablets (40 mg total) by mouth 2 (two) times daily. 08/06/19   Camnitz, Ocie Doyne, MD  traMADol (ULTRAM) 50 MG tablet Take 1 tablet (50 mg total) by mouth every 12 (twelve) hours as needed for moderate pain or severe pain. 05/10/17   Rai, Vernelle Emerald, MD    Allergies    Novocain [procaine] and Amiodarone  Review of Systems   Review of Systems  All other systems reviewed and are negative.   Physical Exam Updated Vital Signs BP (!) 143/68 (BP Location: Right Arm)   Pulse 73 Comment: Simultaneous filing. User may not have seen previous data.  Temp 97.8 F (36.6 C) (Oral)   Resp 19 Comment: Simultaneous filing. User may not have seen previous data.  Ht 6' (1.829 m)   SpO2 98% Comment: Simultaneous filing. User may not have seen previous data.  BMI 27.12 kg/m   Physical Exam Vitals and nursing note reviewed.   86 year old male, resting comfortably and in no acute distress. Vital signs are significant for mildly elevated  blood pressure. Oxygen saturation is 98%, which is normal. Head is normocephalic and atraumatic. PERRLA, EOMI. Oropharynx is clear. Neck is nontender and supple without adenopathy or JVD. Back is nontender and there is no CVA tenderness. Lungs are clear without rales, wheezes, or rhonchi. Chest is nontender. Heart has regular rate and rhythm without murmur. Abdomen is soft, slightly distended, and diffusely tender with mild voluntary guarding and moderate rebound tenderness.  There is no point tenderness identified.  There  are no masses or hepatosplenomegaly and peristalsis is hypoactive. Genitalia: Uncircumcised penis without phimosis or paraphimosis.  Testes descended without masses or tenderness.  No scrotal masses appreciated but there is generalized tenderness with palpation of the inguinal canal bilaterally. Extremities have no cyanosis or edema, full range of motion is present. Skin is warm and dry without rash. Neurologic: Mental status is normal, cranial nerves are intact, there are no motor or sensory deficits.  ED Results / Procedures / Treatments   Labs (all labs ordered are listed, but only abnormal results are displayed) Labs Reviewed  COMPREHENSIVE METABOLIC PANEL - Abnormal; Notable for the following components:      Result Value   Sodium 133 (*)    Glucose, Bld 155 (*)    BUN 28 (*)    Creatinine, Ser 2.52 (*)    Calcium 8.8 (*)    Total Protein 6.4 (*)    Albumin 3.2 (*)    GFR calc non Af Amer 23 (*)    GFR calc Af Amer 26 (*)    All other components within normal limits  CBC - Abnormal; Notable for the following components:   WBC 16.9 (*)    All other components within normal limits  CBC WITH DIFFERENTIAL/PLATELET - Abnormal; Notable for the following components:   WBC 20.0 (*)    RBC 4.19 (*)    Neutro Abs 16.2 (*)    Monocytes Absolute 1.7 (*)    Abs Immature Granulocytes 0.14 (*)    All other components within normal limits  LACTIC ACID, PLASMA -  Abnormal; Notable for the following components:   Lactic Acid, Venous 2.2 (*)    All other components within normal limits  URINE CULTURE  CULTURE, BLOOD (ROUTINE X 2)  CULTURE, BLOOD (ROUTINE X 2)  SARS CORONAVIRUS 2 BY RT PCR (HOSPITAL ORDER, [facility])  LIPASE, BLOOD  URINALYSIS, ROUTINE W REFLEX MICROSCOPIC  LACTIC ACID, PLASMA    EKG EKG Interpretation  Date/Time:  Tuesday Sep 01 2019 18:22:34 EDT Ventricular Rate:  65 PR Interval:  130 QRS Duration: 140 QT Interval:  454 QTC Calculation: 472 R Axis:   -80 Text Interpretation: Atrial-sensed ventricular-paced rhythm Biventricular pacemaker detected Abnormal ECG When compared with ECG of 11/29/2018, No significant change was found Confirmed by Delora Fuel ([id number]) on 09/01/2019 11:49:33 PM   Radiology No results found.  Procedures Procedures   Medications Ordered in ED Medications  sodium chloride flush (NS) 0.9 % injection 3 mL (has no administration in time range)  ondansetron (ZOFRAN) injection 4 mg (has no administration in time range)  morphine 4 MG/ML injection 4 mg (has no administration in time range)  sodium chloride 0.9 % bolus 1,000 mL (has no administration in time range)    ED Course  I have reviewed the triage vital signs and the nursing notes.  Pertinent labs & imaging results that were available during my care of the patient were reviewed by me and considered in my medical decision making (see chart for details).  MDM Rules/Calculators/A&P Generalized abdominal pain of uncertain cause.  Consider diverticulitis, urolithiasis, urinary tract infection, small bowel obstruction.  With generalized tenderness and rebound tenderness and guarding, I am also concerned about possibility of perforated viscus, perhaps secondary to diverticulitis.  Old records are reviewed and he had CT of abdomen and pelvis done on April 29 which showed extensive sigmoid diverticulosis but no renal calculi.   Labs showed moderate renal insufficiency which is unchanged from baseline,  moderate leukocytosis of 16.9 with no differential done, and mild hyponatremia of doubtful clinical significance.  Patient spent a long time in waiting room before being brought back to a room.  It has been about 9 hours since his initial labs.  Will repeat CBC and also check lactic acid level.  Urinalysis is also pending.  He will be sent for CT of abdomen and pelvis.  Repeat WBC has increased to 20.0 with slight left shift.  Lactic acid is mildly elevated at 2.2 and is given additional IV fluid.  CT of abdomen and pelvis is still pending.  Case is signed out to Dr. Kathrynn Humble.  Final Clinical Impression(s) / ED Diagnoses Final diagnoses:  Acute diverticulitis  Renal insufficiency    Rx / DC Orders ED Discharge Orders    None       Delora Fuel, MD 37/[birth year]/64 [phone]

## 2019-09-02 NOTE — ED Notes (Signed)
Help get patient straighten up in the bed patient is resting with call bell in reach and family at bedside

## 2019-09-02 NOTE — ED Provider Notes (Addendum)
  Physical Exam  BP (!) 130/53   Pulse 69   Temp 97.8 F (36.6 C) (Oral)   Resp 14   Ht 6' (1.829 m)   SpO2 97%   BMI 27.12 kg/m   Physical Exam  ED Course/Procedures     Procedures  MDM   Assuming care of patient from Dr. Roxanne Mins.   Patient in the ED for lower abd pain. Workup thus far shows elevated WC.  Pt has diffuse tenderness.  Concerning findings are as following : exam that shows rebound tenderness. Important pending results are CT scan.  According to Dr. Roxanne Mins, plan is to f/u on CT scan and treat.   Patient had no complains, no concerns from the nursing side. Will continue to monitor.   Varney Biles, MD 09/02/19 0720   8:37 AM   Results from the ER workup discussed with the patient face to face and all questions answered to the best of my ability. CT scan is showing severe diverticulitis without complication such as perforated viscus or abscess.  He continues to have significant tenderness when he moves.  We have decided to admit the patient for IV antibiotics and pain control.   Varney Biles, MD 09/02/19 [phone]

## 2019-09-03 DIAGNOSIS — E872 Acidosis, unspecified: Secondary | ICD-10-CM | POA: Diagnosis present

## 2019-09-03 DIAGNOSIS — E871 Hypo-osmolality and hyponatremia: Secondary | ICD-10-CM | POA: Diagnosis present

## 2019-09-03 LAB — CBC
HCT: 35 % — ABNORMAL LOW (ref 39.0–52.0)
Hemoglobin: 11 g/dL — ABNORMAL LOW (ref 13.0–17.0)
MCH: 30.2 pg (ref 26.0–34.0)
MCHC: 31.4 g/dL (ref 30.0–36.0)
MCV: 96.2 fL (ref 80.0–100.0)
Platelets: 181 10*3/uL (ref 150–400)
RBC: 3.64 MIL/uL — ABNORMAL LOW (ref 4.22–5.81)
RDW: 14.7 % (ref 11.5–15.5)
WBC: 13.2 10*3/uL — ABNORMAL HIGH (ref 4.0–10.5)
nRBC: 0 % (ref 0.0–0.2)

## 2019-09-03 LAB — BASIC METABOLIC PANEL
Anion gap: 8 (ref 5–15)
BUN: 36 mg/dL — ABNORMAL HIGH (ref 8–23)
CO2: 19 mmol/L — ABNORMAL LOW (ref 22–32)
Calcium: 7.7 mg/dL — ABNORMAL LOW (ref 8.9–10.3)
Chloride: 104 mmol/L (ref 98–111)
Creatinine, Ser: 2.49 mg/dL — ABNORMAL HIGH (ref 0.61–1.24)
GFR calc Af Amer: 26 mL/min — ABNORMAL LOW (ref >60)
GFR calc non Af Amer: 23 mL/min — ABNORMAL LOW (ref >60)
Glucose, Bld: 102 mg/dL — ABNORMAL HIGH (ref 70–99)
Potassium: 4.6 mmol/L (ref 3.5–5.1)
Sodium: 131 mmol/L — ABNORMAL LOW (ref 135–145)

## 2019-09-03 LAB — URINE CULTURE

## 2019-09-03 LAB — GLUCOSE, CAPILLARY
Glucose-Capillary: 93 mg/dL (ref 70–99)
Glucose-Capillary: 85 mg/dL (ref 70–99)

## 2019-09-03 MED ORDER — DIPHENHYDRAMINE HCL 50 MG/ML IJ SOLN
12.5000 mg | Freq: Three times a day (TID) | INTRAMUSCULAR | Status: DC | PRN
  Administered 2019-09-03: 12.5 mg via INTRAVENOUS
  Filled 2019-09-03: qty 1

## 2019-09-03 MED ORDER — SODIUM CHLORIDE 0.9 % IV BOLUS
250.0000 mL | Freq: Once | INTRAVENOUS | Status: AC
  Administered 2019-09-03: 250 mL via INTRAVENOUS

## 2019-09-03 MED ORDER — DIPHENHYDRAMINE HCL 50 MG/ML IJ SOLN: 12.5000 mg | Freq: Three times a day (TID) | INTRAMUSCULAR | Status: DC | PRN

## 2019-09-03 NOTE — Progress Notes (Addendum)
Progress Note    [name]  FMB:[id number] DOB: 1933-03-22  DOA: 09/01/2019 PCP: [name], MD    Brief Narrative:    Medical records reviewed and are as summarized below:  [name] is an 86 y.o. male with a past medical history that includes A. fib, TAVR, prediabetes, hypertension, chronic kidney disease stage IV, chronic combined CHF, AICD placement and CAD status post CABG admitted May 26 with diverticulitis.  IV antibiotics gentle IV fluids initiated.  Assessment/Plan:   Principal Problem:   Diverticulitis Active Problems:   Persistent atrial fibrillation (HCC)   Chronic combined systolic (congestive) and diastolic (congestive) heart failure (HCC)   Cardiac resynchronization therapy defibrillator (CRT-D) in place   Acute urinary retention   Metabolic acidosis   Essential hypertension   Stage 4 chronic kidney disease (HCC)   Hyponatremia   Dyslipidemia   #1.  Diverticulitis.  Little improvement this morning.  CT scan reveals sigmoid diverticulitis, inflammation extensive but no pneumoperitoneum or abscess.  Temp 99.  WBCs trending down.  Hemodynamically stable nontoxic-appearing. Of note, one blood culture + the other no growth to date. Likely contaminate.  -Continue IV antibiotics -will give clear liquids -Continue gentle IV fluids -Supportive therapy -Mobilize as able  #2.  Hyponatremia/metabolic acidosis.  Likely related to above.  Has had decreased oral intake.  Chart review indicates his sodium level baseline is low end of normal.  Currently at 131. CO2 19.  No gi losses -IV fluids as noted above -Monitor  #3.  Acute urinary retention.  Resolved.  Voided twice last night without problem.  #4.  Chronic combined systolic and diastolic heart failure.  Seems a little on the dry side.  Home medications include Coreg, Lasix -Holding Lasix for now -Continue Coreg and imdur -Monitor intake and output -Obtain daily weights  #5.  Chronic kidney  disease stage IV.  Creatinine stable at baseline -IV fluids as noted above  #6.  Persistent A. fib.  Home medications include Coreg. mexiletine and Eliquis.  -Continue home meds  #7.  Cardiac resynchronization therapy defibrillator in place.  Amatory shows two episodes of nonsustained V. tach.  Five beat run and then 19 beat run.  Asymptomatic. -Home meds as noted above -Monitor on telemetry  #8.  Hypertension.  Blood pressure on the low side on admission.  Improved with IV fluids.  Holding home Lasix -Home meds as noted above -Monitor  #9.  Dyslipidemia. -Continue statin  Family Communication/Anticipated D/C date and plan/Code Status   DVT prophylaxi eliquis Code Status: dnr Family Communication: patient Disposition Plan: Status is: Inpatient  Remains inpatient appropriate because:IV treatments appropriate due to intensity of illness or inability to take PO and Inpatient level of care appropriate due to severity of illness   Dispo: The patient is from: Home              Anticipated d/c is to: Home              Anticipated d/c date is: 2 days              Patient currently is not medically stable to d/c.   Medical Consultants:    None.   Anti-Infectives:    Flagyl 5/24>>  Cefipime 5/24>>  Subjective:   Continues to experience abdominal pain.  Complains of some bloating.  Objective:    Vitals:   09/03/19 0145 09/03/19 0426 09/03/19 0646 09/03/19 0837  BP: (!) 98/53 (!) 102/52 (!) 115/49 120/66  Pulse:  70 69 65 (!) 59  Resp: _0 Temp:  97.7 F (36.5 C) 97.6 F (36.4 C) 98 F (36.7 C)  TempSrc:  Oral Oral   SpO2: 98% 97% 97% 100%  Weight:      Height:        Intake/Output Summary (Last 24 hours) at 09/03/2019 0906 Last data filed at 09/03/2019 2831 Gross per 24 hour  Intake 2558.41 ml  Output 565 ml  Net 1993.41 ml   Filed Weights   09/02/19 1508  Weight: 93.5 kg    Exam: General: Somewhat pale appearing ill-appearing but no acute  distress CV: Regular rate and rhythm no murmur gallop or rub no lower extremity edema Respiratory: No increased work of breathing respirations slightly shallow with good air movement clear breath sounds bilaterally I hear no wheeze no rhonchi Abdomen: Slightly distended but soft exquisitely tender to the slightest palpation all over.  It causes him pain to laugh.  Very sluggish bowel sounds in the lower quadrants only Musculoskeletal: Joints without swelling/erythema full range of motion Neuro: Awake alert oriented x3 speech clear moves extremities spontaneously  Data Reviewed:   I have personally reviewed following labs and imaging studies:  Labs: Labs show the following:   Basic Metabolic Panel: Recent Labs  Lab 09/01/19 1835 09/03/19 0344  NA 133* 131*  K 4.8 4.6  CL 101 104  CO2 22 19*  GLUCOSE 155* 102*  BUN 28* 36*  CREATININE 2.52* 2.49*  CALCIUM 8.8* 7.7*   GFR Estimated Creatinine Clearance: 26.2 mL/min (A) (by C-G formula based on SCr of 2.49 mg/dL (H)). Liver Function Tests: Recent Labs  Lab 09/01/19 1835  AST 21  ALT 22  ALKPHOS 49  BILITOT 1.2  PROT 6.4*  ALBUMIN 3.2*   Recent Labs  Lab 09/01/19 1835  LIPASE 22   No results for input(s): AMMONIA in the last 168 hours. Coagulation profile No results for input(s): INR, PROTIME in the last 168 hours.  CBC: Recent Labs  Lab 09/01/19 1835 09/02/19 0445 09/03/19 0344  WBC 16.9* 20.0* 13.2*  NEUTROABS  --  16.2*  --   HGB 13.3 13.0 11.0*  HCT 41.3 39.9 35.0*  MCV 94.9 95.2 96.2  PLT 288 227 181   Cardiac Enzymes: No results for input(s): CKTOTAL, CKMB, CKMBINDEX, TROPONINI in the last 168 hours. BNP (last 3 results) No results for input(s): PROBNP in the last 8760 hours. CBG: Recent Labs  Lab 09/02/19 2055 09/02/19 2350 09/03/19 0545 09/03/19 0834  GLUCAP 76 98 93 85   D-Dimer: No results for input(s): DDIMER in the last 72 hours. Hgb A1c: No results for input(s): HGBA1C in the last  72 hours. Lipid Profile: No results for input(s): CHOL, HDL, LDLCALC, TRIG, CHOLHDL, LDLDIRECT in the last 72 hours. Thyroid function studies: No results for input(s): TSH, T4TOTAL, T3FREE, THYROIDAB in the last 72 hours.  Invalid input(s): FREET3 Anemia work up: No results for input(s): VITAMINB12, FOLATE, FERRITIN, TIBC, IRON, RETICCTPCT in the last 72 hours. Sepsis Labs: Recent Labs  Lab 09/01/19 1835 09/02/19 0445 09/02/19 0812 09/02/19 1154 09/02/19 1652 09/03/19 0344  PROCALCITON  --   --   --  3.38  --   --   WBC 16.9* 20.0*  --   --   --  13.2*  LATICACIDVEN  --  2.2* 2.1* 1.9 1.8  --     Microbiology Recent Results (from the past 240 hour(s))  Blood Culture (routine x 2)  Status: None (Preliminary result)   Collection Time: 09/02/19  8:10 AM   Specimen: BLOOD RIGHT ARM  Result Value Ref Range Status   Specimen Description BLOOD RIGHT ARM  Final   Special Requests   Final    BOTTLES DRAWN AEROBIC AND ANAEROBIC Blood Culture adequate volume   Culture   Final    NO GROWTH 1 DAY Performed at [facility], [place] [address] [place], [place] [address]    Report Status PENDING  Incomplete  Blood Culture (routine x 2)     Status: None (Preliminary result)   Collection Time: 09/02/19  8:10 AM   Specimen: BLOOD  Result Value Ref Range Status   Specimen Description BLOOD RIGHT ANTECUBITAL  Final   Special Requests   Final    BOTTLES DRAWN AEROBIC AND ANAEROBIC Blood Culture results may not be optimal due to an excessive volume of blood received in culture bottles   Culture   Final    NO GROWTH 1 DAY Performed at [facility], [place] [address] [place], [place] [address]    Report Status PENDING  Incomplete  SARS Coronavirus 2 by RT PCR (hospital order, performed in [facility] hospital lab) Nasopharyngeal Nasopharyngeal Swab     Status: None   Collection Time: 09/02/19  8:12 AM   Specimen: Nasopharyngeal Swab  Result Value Ref Range Status   SARS  Coronavirus 2 NEGATIVE NEGATIVE Final    Comment: (NOTE) SARS-CoV-2 target nucleic acids are NOT DETECTED. The SARS-CoV-2 RNA is generally detectable in upper and lower respiratory specimens during the acute phase of infection. The lowest concentration of SARS-CoV-2 viral copies this assay can detect is 250 copies / mL. A negative result does not preclude SARS-CoV-2 infection and should not be used as the sole basis for treatment or other patient management decisions.  A negative result may occur with improper specimen collection / handling, submission of specimen other than nasopharyngeal swab, presence of viral mutation(s) within the areas targeted by this assay, and inadequate number of viral copies (<250 copies / mL). A negative result must be combined with clinical observations, patient history, and epidemiological information. Fact Sheet for Patients:   StrictlyIdeas.no Fact Sheet for Healthcare Providers: BankingDealers.co.za This test is not yet approved or cleared  by the [place] FDA and has been authorized for detection and/or diagnosis of SARS-CoV-2 by FDA under an Emergency Use Authorization (EUA).  This EUA will remain in effect (meaning this test can be used) for the duration of the COVID-19 declaration under Section 564(b)(1) of the Act, 21 U.S.C. section 360bbb-3(b)(1), unless the authorization is terminated or revoked sooner. Performed at [facility], [place] [address], [place], [place] [address]   Urine culture     Status: Abnormal   Collection Time: 09/02/19  9:45 AM   Specimen: In/Out Cath Urine  Result Value Ref Range Status   Specimen Description IN/OUT CATH URINE  Final   Special Requests   Final    NONE Performed at [facility], [place] [address] [place], [place] [address]    Culture MULTIPLE SPECIES PRESENT, SUGGEST RECOLLECTION (A)  Final   Report Status 09/03/2019 FINAL  Final     Procedures and diagnostic studies:  CT ABDOMEN PELVIS WO CONTRAST  Result Date: 09/02/2019 CLINICAL DATA:  Diverticulitis suspected. Lower abdominal pain for 1 week. EXAM: CT ABDOMEN AND PELVIS WITHOUT CONTRAST TECHNIQUE: Multidetector CT imaging of the abdomen and pelvis was performed following the standard protocol  without IV contrast. COMPARISON:  08/05/2019 FINDINGS: Lower chest:  Biventricular pacer.  Small sliding hiatal hernia. Hepatobiliary: No focal liver abnormality.No evidence of biliary obstruction or stone. Pancreas: Unremarkable. Spleen: Unremarkable. Adrenals/Urinary Tract: Negative adrenals. No hydronephrosis or stone. Unremarkable bladder. Stomach/Bowel: Extensive sigmoid diverticulosis with extensive stranding around a thickened mid sigmoid diverticulum. No pneumoperitoneum or fluid collection. No bowel obstruction pericecal inflammation. Small sliding hiatal hernia Vascular/Lymphatic: No acute vascular abnormality. Extensive atherosclerotic calcification of the aorta and iliacs. No mass or adenopathy. Reproductive:No acute finding Other: No ascites or pneumoperitoneum. Trace peritoneal fluid seen at the pelvis and at the right colonic gutter, considered reactive Musculoskeletal: No acute abnormalities. Advanced lumbar spine degeneration. IMPRESSION: 1. Sigmoid diverticulitis. Inflammation is extensive but there is no pneumoperitoneum or abscess. 2. Extensive sigmoid diverticulosis. Electronically Signed   By: Monte Fantasia M.D.   On: 09/02/2019 07:43    Medications:   . apixaban  2.5 mg Oral BID  . atorvastatin  20 mg Oral QPM  . carvedilol  25 mg Oral BID WC  . docusate sodium  100 mg Oral BID  . famotidine  40 mg Oral QPM  . hydrALAZINE  12.5 mg Oral BID AC  . isosorbide mononitrate  30 mg Oral QPM  . mexiletine  300 mg Oral BID  . pantoprazole  40 mg Oral Daily  . sodium chloride flush  3 mL Intravenous Q12H  . sotalol  40 mg Oral BID   Continuous Infusions: .  ceFEPime (MAXIPIME) IV    . lactated ringers 75 mL/hr at 09/03/19 0142  . metronidazole 500 mg (09/03/19 0823)     LOS: 1 day   Radene Gunning NP [facility]   How to contact the [facility] Attending or Consulting provider [place] or covering provider during after hours [place], for this patient?  1. Check the care team in [facility] and look for a) attending/consulting [facility] provider listed and b) the [facility] team listed 2. Log into [email or web address] and use [place]'s universal password to access. If you do not have the password, please contact the hospital operator. 3. Locate the [facility] provider you are looking for under [facility] and page to a number that you can be directly reached. 4. If you still have difficulty reaching the provider, please page the [facility] (Director on Call) for the Hospitalists listed on amion for assistance.  09/03/2019, 9:06 AM

## 2019-09-03 NOTE — Plan of Care (Signed)
  Problem: Education: Goal: Knowledge of General Education information will improve Description: Including pain rating scale, medication(s)/side effects and non-pharmacologic comfort measures Outcome: Progressing   Problem: Coping: Goal: Level of anxiety will decrease Outcome: Progressing   Problem: Pain Managment: Goal: General experience of comfort will improve Outcome: Progressing

## 2019-09-03 NOTE — Progress Notes (Addendum)
Patient had a 5 beat run of V-tach. Ardith Dark, NP notified. No new orders given. Patient stable. Will continue to monitor.

## 2019-09-03 NOTE — Progress Notes (Addendum)
PHARMACY - PHYSICIAN COMMUNICATION CRITICAL VALUE ALERT - BLOOD CULTURE IDENTIFICATION (BCID)  [name] is an 86 y.o. male who presented to [facility] on 09/01/2019 with a chief complaint of abdominal pain and some shortness of breath. Recently had E. Coli enteritis. On admission he was found to have diverticulitis with no pneumoperitoneum or abscess.   Assessment:  Recently had E. Coli enteritis. On admission he was found to have diverticulitis with no pneumoperitoneum or abscess.  WBC down to 13.2, Scr down to 2.49, and is afebrile. Blood culture growing 1/4 gram positive rods (Only in the anaerobic bottle).  Name of physician (or Provider) Contacted: Dr. Doristine Bosworth   Current antibiotics: Cefepime 2 gm IV q24hr; Flagyl 500 mg IV q8hr  Changes to prescribed antibiotics recommended: No change needed, culture likely a contaminate  Acey Lav, PharmD  PGY1 [place] Resident  09/03/2019  9:30 AM

## 2019-09-03 NOTE — Progress Notes (Signed)
Patient had a 19 beat run of Vtach _0 . Patient stable. M. Sharlet Salina, NP notified. Will continue to monitor.

## 2019-09-04 ENCOUNTER — Inpatient Hospital Stay (HOSPITAL_COMMUNITY): Payer: Medicare HMO

## 2019-09-04 DIAGNOSIS — K567 Ileus, unspecified: Secondary | ICD-10-CM | POA: Diagnosis not present

## 2019-09-04 LAB — BASIC METABOLIC PANEL
Anion gap: 8 (ref 5–15)
BUN: 37 mg/dL — ABNORMAL HIGH (ref 8–23)
CO2: 19 mmol/L — ABNORMAL LOW (ref 22–32)
Calcium: 7.8 mg/dL — ABNORMAL LOW (ref 8.9–10.3)
Chloride: 103 mmol/L (ref 98–111)
Creatinine, Ser: 2.22 mg/dL — ABNORMAL HIGH (ref 0.61–1.24)
GFR calc Af Amer: 30 mL/min — ABNORMAL LOW (ref >60)
GFR calc non Af Amer: 26 mL/min — ABNORMAL LOW (ref >60)
Glucose, Bld: 124 mg/dL — ABNORMAL HIGH (ref 70–99)
Potassium: 4.4 mmol/L (ref 3.5–5.1)
Sodium: 130 mmol/L — ABNORMAL LOW (ref 135–145)

## 2019-09-04 LAB — CBC
HCT: 33.6 % — ABNORMAL LOW (ref 39.0–52.0)
Hemoglobin: 10.9 g/dL — ABNORMAL LOW (ref 13.0–17.0)
MCH: 30.1 pg (ref 26.0–34.0)
MCHC: 32.4 g/dL (ref 30.0–36.0)
MCV: 92.8 fL (ref 80.0–100.0)
Platelets: 196 10*3/uL (ref 150–400)
RBC: 3.62 MIL/uL — ABNORMAL LOW (ref 4.22–5.81)
RDW: 14.6 % (ref 11.5–15.5)
WBC: 10.6 10*3/uL — ABNORMAL HIGH (ref 4.0–10.5)
nRBC: 0 % (ref 0.0–0.2)

## 2019-09-04 LAB — MAGNESIUM: Magnesium: 1.8 mg/dL (ref 1.7–2.4)

## 2019-09-04 LAB — GLUCOSE, CAPILLARY: Glucose-Capillary: 105 mg/dL — ABNORMAL HIGH (ref 70–99)

## 2019-09-04 NOTE — Plan of Care (Signed)
  Problem: Nutrition: Goal: Adequate nutrition will be maintained Outcome: Progressing   Problem: Coping: Goal: Level of anxiety will decrease Outcome: Progressing   Problem: Pain Managment: Goal: General experience of comfort will improve Outcome: Progressing

## 2019-09-04 NOTE — Evaluation (Signed)
Physical Therapy Evaluation Patient Details Name: [name] MRN: [id number] DOB: 1932/05/10 Today's Date: 09/04/2019   History of Present Illness  86 yo male with onset of generalized weakness after a flare of his diverticulitis with 10/10 abd pain was referred to PT.  Imaging suggests ileus, noted more tender and distended today.  Has metabolic acidosis, acute urinary retention. PMHx: TAVR, a-fib, CKD4, CHF, AICD, HTN, CAD with CABG, diverticulitis,   Clinical Impression  Pt was seen to initiate mobility and due to severe abd pain is struggling with movement.  He is requiring mod to max assist for all bed mob and transfers but cannot take more than a couple steps side of bed with pain.  Follow him acutely to increase activity tolerance and make progression of standing and transitions to increase independence with gait for CIR.  He may not be accepted if he progresses well, but should default to some form of therapy to avoid disuse loss of mm and ROM.      Follow Up Recommendations CIR    Equipment Recommendations  None recommended by PT    Recommendations for Other Services Rehab consult     Precautions / Restrictions Precautions Precautions: Fall Precaution Comments: severe abd pain Restrictions Weight Bearing Restrictions: No      Mobility  Bed Mobility Overal bed mobility: Needs Assistance Bed Mobility: Supine to Sit;Sit to Supine     Supine to sit: Max assist Sit to supine: Mod assist   General bed mobility comments: used bedrail to assist himself to return to bed  Transfers Overall transfer level: Needs assistance Equipment used: Rolling walker (2 wheeled);1 person hand held assist Transfers: Sit to/from Stand Sit to Stand: Max assist;Mod assist         General transfer comment: mod to max assist to stand depending on his ability to use UE's  Ambulation/Gait Ambulation/Gait assistance: Mod assist Gait Distance (Feet): 3 Feet Assistive device: Rolling  walker (2 wheeled);1 person hand held assist Gait Pattern/deviations: Step-to pattern;Wide base of support;Trunk flexed;Decreased stride length Gait velocity: reduced Gait velocity interpretation: <1.8 ft/sec, indicate of risk for recurrent falls General Gait Details: sidestepping along side of bed, weak and legs are buckling  Stairs            Wheelchair Mobility    Modified Rankin (Stroke Patients Only)       Balance Overall balance assessment: Needs assistance Sitting-balance support: Feet supported Sitting balance-Leahy Scale: Fair     Standing balance support: Bilateral upper extremity supported;During functional activity Standing balance-Leahy Scale: Poor                               Pertinent Vitals/Pain Pain Assessment: 0-10 Pain Score: 10-Worst pain ever Pain Location: abdomen with movement Pain Descriptors / Indicators: Tightness;Tender Pain Intervention(s): Limited activity within patient's tolerance;Monitored during session;Premedicated before session;Repositioned    Home Living Family/patient expects to be discharged to:: Private residence Living Arrangements: Spouse/significant other Available Help at Discharge: Family;Available PRN/intermittently(daughter may be helpful. pt is caregiver) Type of Home: House Home Access: Ramped entrance;Stairs to enter Entrance Stairs-Rails: Right Entrance Stairs-Number of Steps: 1 Home Layout: One level Home Equipment: [place] - 2 wheels;Cane - quad;Grab bars - toilet;Grab bars - tub/shower      Prior Function Level of Independence: Independent with assistive device(s)         Comments: has been doing gardening and not using AD     Hand Dominance  Extremity/Trunk Assessment   Upper Extremity Assessment Upper Extremity Assessment: Generalized weakness    Lower Extremity Assessment Lower Extremity Assessment: Generalized weakness    Cervical / Trunk Assessment Cervical / Trunk  Assessment: Kyphotic  Communication   Communication: No difficulties  Cognition Arousal/Alertness: Awake/alert Behavior During Therapy: WFL for tasks assessed/performed Overall Cognitive Status: Within Functional Limits for tasks assessed                                        General Comments General comments (skin integrity, edema, etc.): t was assisted to walk a few steps side of bed but was much weaker and less independent than his last level at home    Exercises     Assessment/Plan    PT Assessment Patient needs continued PT services  PT Problem List Decreased strength;Decreased range of motion;Decreased activity tolerance;Decreased balance;Decreased mobility;Decreased coordination;Decreased knowledge of use of DME;Decreased safety awareness;Obesity;Pain       PT Treatment Interventions DME instruction;Gait training;Functional mobility training;Therapeutic activities;Therapeutic exercise;Balance training;Neuromuscular re-education;Patient/family education    PT Goals (Current goals can be found in the Care Plan section)  Acute Rehab PT Goals Patient Stated Goal: to walk and get home PT Goal Formulation: With patient Time For Goal Achievement: 09/18/19 Potential to Achieve Goals: Good    Frequency Min 3X/week   Barriers to discharge Decreased caregiver support wife home but pt is her caregiver    Co-evaluation               AM-PAC PT "6 Clicks" Mobility  Outcome Measure Help needed turning from your back to your side while in a flat bed without using bedrails?: A Little Help needed moving from lying on your back to sitting on the side of a flat bed without using bedrails?: A Lot Help needed moving to and from a bed to a chair (including a wheelchair)?: A Lot Help needed standing up from a chair using your arms (e.g., wheelchair or bedside chair)?: A Lot Help needed to walk in hospital room?: A Lot Help needed climbing 3-5 steps with a railing? :  Total 6 Click Score: 12    End of Session Equipment Utilized During Treatment: Gait belt Activity Tolerance: Patient limited by fatigue;Patient limited by pain;Treatment limited secondary to medical complications (Comment) Patient left: in bed;with call bell/phone within reach;with bed alarm set Nurse Communication: Mobility status PT Visit Diagnosis: Unsteadiness on feet (R26.81);Muscle weakness (generalized) (M62.81);Difficulty in walking, not elsewhere classified (R26.2);Ataxic gait (R26.0);Pain Pain - Right/Left: (abdomen) Pain - part of body: (abdomen)    Time: 3539-1225 PT Time Calculation (min) (ACUTE ONLY): 17 min   Charges:   PT Evaluation $PT Eval Moderate Complexity: 1 Mod         Ramond Dial 09/04/2019, 1:42 PM  Mee Hives, PT MS Acute Rehab Dept. Number: [place] [facility] [place]

## 2019-09-04 NOTE — Progress Notes (Signed)
Rehab Admissions Coordinator Note:  Per PT recommendation, this patient was screened by Raechel Ache for appropriateness for an Inpatient Acute Rehab Consult. Per PT eval, pt's mobility is limited by his severe abdominal pain. Anticipate that as his acute issues are treated, pt will progress quickly and my not need IP Rehab. At this time, we will follow over the weekend for to see how pt's mobility progresses as his ileus is treated. If pt continues with functional deficits once pain controlled, will pursue consult order.   Raechel Ache 09/04/2019, 2:01 PM  I can be reached at [phone].

## 2019-09-04 NOTE — Progress Notes (Addendum)
Progress Note    [name]  AYT:[id number] DOB: 1933-01-01  DOA: 09/01/2019 PCP: [name], MD    Brief Narrative:    Medical records reviewed and are as summarized below:  [name] is an 86 y.o. male with a past medical history significant for A. fib, TAVR, prediabetes, chronic kidney disease stage IV, chronic combined CHF, AICD placement, hypertension, CAD status post CABG admitted May 26 with diverticulitis.  Provided with gentle IV fluids and IV antibiotics.  Abdomen today more distended more firm more tender.  No nausea vomiting seems to be tolerating clears. Will get kub  Assessment/Plan:   Principal Problem:   Diverticulitis Active Problems:   Persistent atrial fibrillation (HCC)   Chronic combined systolic (congestive) and diastolic (congestive) heart failure (HCC)   Cardiac resynchronization therapy defibrillator (CRT-D) in place   Acute urinary retention   Metabolic acidosis   Essential hypertension   Stage 4 chronic kidney disease (HCC)   Hyponatremia   Dyslipidemia   #1.  Diverticulitis/ileus. Patient has tolerated clear liquids since yesterday however his abdomen is more distended more firm and more tender. BS more active. Denies nausea or vomiting.  Max temp 98.5.  Hemodynamically stable and nontoxic-appearing. Abdominal xray reveals Air-filled large and small bowel loops are noted suggesting possible Ileus -will stop clear liquids and make npo xcept sips with meds for bowel rest.  -Continue gentle IV fluids -Continue IV antibiotics -Mobilize  #3.  Hyponatremia/metabolic acidosis.  Likely related to above.  No GI losses.  Sodium and due to stable. -IV fluids as noted above -Monitor  #3.  Urinary retention.  Seems to be resolved.  #4.  Chronic combined systolic diastolic heart failure. Does not appear overloaded.  Home medications include Coreg and Lasix -Continue Coreg and imdur -hold lasix -daily weight -intake and output  #5.   Chronic kidney disease stage 4. Stable  #6. Persistent a fib. Home meds include eliquis and mexiletine -continue home meds  #7.  Cardiac resynchronization therapy defibrillator in place.  Telemetry shows two episodes of nonsustained V. Tach and a Five beat run and then 19 beat run 5/27.  Asymptomatic. -Home meds as noted above -Monitor on telemetry  #8.  Hypertension.  Blood pressure on the low side on admission.  Improved with IV fluids.  Holding home Lasix -Home meds as noted above -Monitor  #9.  Dyslipidemia. -Continue statin    Family Communication/Anticipated D/C date and plan/Code Status   DVT prophylaxis: eliquis ordered. Code Status: dnr  Family Communication: patient Disposition Plan: Status is: Inpatient  Remains inpatient appropriate because:IV treatments appropriate due to intensity of illness or inability to take PO   Dispo: The patient is from: Home                Anticipated d/c is to: Home              Anticipated d/c date is: 2 days              Patient currently is not medically stable to d/c.          Medical Consultants:    None.   Anti-Infectives:    Flagyl 5/24>>  Cefipime 5/24>>  Subjective:   Lying in bed drinking ginger ale.  Complains of increased "bloating" of his stomach.  Continues with abdominal pain.  Denies any nausea or vomiting  Objective:    Vitals:   09/03/19 2237 09/03/19 2353 09/04/19 0455 09/04/19 0822  BP:  Marland Kitchen)  117/52 (!) 129/55 (!) 126/59  Pulse: 69 60 66 62  Resp:  _0 Temp:  98.2 F (36.8 C) 98.6 F (37 C) 98.5 F (36.9 C)  TempSrc:   Oral Oral  SpO2:  98% 96% 98%  Weight:      Height:        Intake/Output Summary (Last 24 hours) at 09/04/2019 0931 Last data filed at 09/04/2019 4128 Gross per 24 hour  Intake 3121.32 ml  Output 400 ml  Net 2721.32 ml   Filed Weights   09/02/19 1508  Weight: 93.5 kg    Exam: General: Well-nourished no acute distress CV: Regular rate and rhythm no  murmur gallop or rub no lower extremity edema pedal pulses present palpable Respiratory: No increased work of breathing with conversation respirations slightly shallow but good air movement clear breath sounds I hear no wheeze no rhonchi Abdomen little more distended than yesterday little firmer than yesterday sluggish bowel sounds exquisite tenderness to the slightest touch equally on the right lower quadrant Musculoskeletal: Joints without swelling/erythema moves all extremities spontaneously full range of motion Neuro: Alert oriented x3 speech clear facial symmetry  Data Reviewed:   I have personally reviewed following labs and imaging studies:  Labs: Labs show the following:   Basic Metabolic Panel: Recent Labs  Lab 09/01/19 1835 09/01/19 1835 09/03/19 0344 09/04/19 0411  NA 133*  --  131* 130*  K 4.8   < > 4.6 4.4  CL 101  --  104 103  CO2 22  --  19* 19*  GLUCOSE 155*  --  102* 124*  BUN 28*  --  36* 37*  CREATININE 2.52*  --  2.49* 2.22*  CALCIUM 8.8*  --  7.7* 7.8*  MG  --   --   --  1.8   < > = values in this interval not displayed.   GFR Estimated Creatinine Clearance: 29.4 mL/min (A) (by C-G formula based on SCr of 2.22 mg/dL (H)). Liver Function Tests: Recent Labs  Lab 09/01/19 1835  AST 21  ALT 22  ALKPHOS 49  BILITOT 1.2  PROT 6.4*  ALBUMIN 3.2*   Recent Labs  Lab 09/01/19 1835  LIPASE 22   No results for input(s): AMMONIA in the last 168 hours. Coagulation profile No results for input(s): INR, PROTIME in the last 168 hours.  CBC: Recent Labs  Lab 09/01/19 1835 09/02/19 0445 09/03/19 0344 09/04/19 0411  WBC 16.9* 20.0* 13.2* 10.6*  NEUTROABS  --  16.2*  --   --   HGB 13.3 13.0 11.0* 10.9*  HCT 41.3 39.9 35.0* 33.6*  MCV 94.9 95.2 96.2 92.8  PLT 288 227 181 196   Cardiac Enzymes: No results for input(s): CKTOTAL, CKMB, CKMBINDEX, TROPONINI in the last 168 hours. BNP (last 3 results) No results for input(s): PROBNP in the last 8760  hours. CBG: Recent Labs  Lab 09/02/19 2055 09/02/19 2350 09/03/19 0545 09/03/19 0834  GLUCAP 76 98 93 85   D-Dimer: No results for input(s): DDIMER in the last 72 hours. Hgb A1c: No results for input(s): HGBA1C in the last 72 hours. Lipid Profile: No results for input(s): CHOL, HDL, LDLCALC, TRIG, CHOLHDL, LDLDIRECT in the last 72 hours. Thyroid function studies: No results for input(s): TSH, T4TOTAL, T3FREE, THYROIDAB in the last 72 hours.  Invalid input(s): FREET3 Anemia work up: No results for input(s): VITAMINB12, FOLATE, FERRITIN, TIBC, IRON, RETICCTPCT in the last 72 hours. Sepsis Labs: Recent Labs  Lab 09/01/19 1835  09/02/19 0445 09/02/19 0812 09/02/19 1154 09/02/19 1652 09/03/19 0344 09/04/19 0411  PROCALCITON  --   --   --  3.38  --   --   --   WBC 16.9* 20.0*  --   --   --  13.2* 10.6*  LATICACIDVEN  --  2.2* 2.1* 1.9 1.8  --   --     Microbiology Recent Results (from the past 240 hour(s))  Blood Culture (routine x 2)     Status: None (Preliminary result)   Collection Time: 09/02/19  8:10 AM   Specimen: BLOOD RIGHT ARM  Result Value Ref Range Status   Specimen Description BLOOD RIGHT ARM  Final   Special Requests   Final    BOTTLES DRAWN AEROBIC AND ANAEROBIC Blood Culture adequate volume   Culture   Final    NO GROWTH 2 DAYS Performed at [facility], [place] [address] [place], [place] [address]    Report Status PENDING  Incomplete  Blood Culture (routine x 2)     Status: None (Preliminary result)   Collection Time: 09/02/19  8:10 AM   Specimen: BLOOD  Result Value Ref Range Status   Specimen Description BLOOD RIGHT ANTECUBITAL  Final   Special Requests   Final    BOTTLES DRAWN AEROBIC AND ANAEROBIC Blood Culture results may not be optimal due to an excessive volume of blood received in culture bottles   Culture  Setup Time   Final    GRAM POSITIVE RODS ANAEROBIC BOTTLE ONLY CRITICAL RESULT CALLED TO, READ BACK BY AND VERIFIED WITH: PHARMD  [place] [phone] FCP Performed at [place] [address] [place], [place] [address]    Culture GRAM POSITIVE RODS  Final   Report Status PENDING  Incomplete  SARS Coronavirus 2 by RT PCR (hospital order, performed in [facility] hospital lab) Nasopharyngeal Nasopharyngeal Swab     Status: None   Collection Time: 09/02/19  8:12 AM   Specimen: Nasopharyngeal Swab  Result Value Ref Range Status   SARS Coronavirus 2 NEGATIVE NEGATIVE Final    Comment: (NOTE) SARS-CoV-2 target nucleic acids are NOT DETECTED. The SARS-CoV-2 RNA is generally detectable in upper and lower respiratory specimens during the acute phase of infection. The lowest concentration of SARS-CoV-2 viral copies this assay can detect is 250 copies / mL. A negative result does not preclude SARS-CoV-2 infection and should not be used as the sole basis for treatment or other patient management decisions.  A negative result may occur with improper specimen collection / handling, submission of specimen other than nasopharyngeal swab, presence of viral mutation(s) within the areas targeted by this assay, and inadequate number of viral copies (<250 copies / mL). A negative result must be combined with clinical observations, patient history, and epidemiological information. Fact Sheet for Patients:   StrictlyIdeas.no Fact Sheet for Healthcare Providers: BankingDealers.co.za This test is not yet approved or cleared  by the [place] FDA and has been authorized for detection and/or diagnosis of SARS-CoV-2 by FDA under an Emergency Use Authorization (EUA).  This EUA will remain in effect (meaning this test can be used) for the duration of the COVID-19 declaration under Section 564(b)(1) of the Act, 21 U.S.C. section 360bbb-3(b)(1), unless the authorization is terminated or revoked sooner. Performed at [facility], [place] [address] [place],  [place] [address]   Urine culture     Status: Abnormal   Collection Time: 09/02/19  9:45 AM  Specimen: In/Out Cath Urine  Result Value Ref Range Status   Specimen Description IN/OUT CATH URINE  Final   Special Requests   Final    NONE Performed at [facility], [address] [place], [place] [address]    Culture MULTIPLE SPECIES PRESENT, SUGGEST RECOLLECTION (A)  Final   Report Status 09/03/2019 FINAL  Final    Procedures and diagnostic studies:  No results found.  Medications:   . apixaban  2.5 mg Oral BID  . atorvastatin  20 mg Oral QPM  . carvedilol  25 mg Oral BID WC  . docusate sodium  100 mg Oral BID  . famotidine  40 mg Oral QPM  . hydrALAZINE  12.5 mg Oral BID AC  . isosorbide mononitrate  30 mg Oral QPM  . mexiletine  300 mg Oral BID  . pantoprazole  40 mg Oral Daily  . sodium chloride flush  3 mL Intravenous Q12H  . sotalol  40 mg Oral BID   Continuous Infusions: . ceFEPime (MAXIPIME) IV 2 g (09/03/19 0940)  . lactated ringers 75 mL/hr at 09/03/19 0142  . metronidazole 500 mg (09/04/19 0856)     LOS: 2 days   Radene Gunning NP  [facility]   How to contact the [facility] Attending or Consulting provider [place] or covering provider during after hours [place], for this patient?  1. Check the care team in [facility] and look for a) attending/consulting [facility] provider listed and b) the [facility] team listed 2. Log into [email or web address] and use [place]'s universal password to access. If you do not have the password, please contact the hospital operator. 3. Locate the [facility] provider you are looking for under [facility] and page to a number that you can be directly reached. 4. If you still have difficulty reaching the provider, please page the [facility] (Director on Call) for the Hospitalists listed on amion for assistance.  09/04/2019, 9:31 AM

## 2019-09-04 NOTE — Progress Notes (Signed)
This RN attempted to place NG tube per MD order with no success. Patient stated that it hurt and he was unable to breathe. I told the patient that I would have someone else attempt to place the NG tube, but the patient refused. I educated the patient on the importance of having the NG tube, but he still refused. Pahwani, MD notified. Will continue to encourage patient.

## 2019-09-05 ENCOUNTER — Inpatient Hospital Stay (HOSPITAL_COMMUNITY): Payer: Medicare HMO

## 2019-09-05 LAB — CBC WITH DIFFERENTIAL/PLATELET
Abs Immature Granulocytes: 0.06 10*3/uL (ref 0.00–0.07)
Basophils Absolute: 0 10*3/uL (ref 0.0–0.1)
Basophils Relative: 0 %
Eosinophils Absolute: 0.1 10*3/uL (ref 0.0–0.5)
Eosinophils Relative: 1 %
HCT: 35.8 % — ABNORMAL LOW (ref 39.0–52.0)
Hemoglobin: 11.8 g/dL — ABNORMAL LOW (ref 13.0–17.0)
Immature Granulocytes: 1 %
Lymphocytes Relative: 10 %
Lymphs Abs: 1.3 10*3/uL (ref 0.7–4.0)
MCH: 29.9 pg (ref 26.0–34.0)
MCHC: 33 g/dL (ref 30.0–36.0)
MCV: 90.9 fL (ref 80.0–100.0)
Monocytes Absolute: 1.4 10*3/uL — ABNORMAL HIGH (ref 0.1–1.0)
Monocytes Relative: 11 %
Neutro Abs: 9.8 10*3/uL — ABNORMAL HIGH (ref 1.7–7.7)
Neutrophils Relative %: 77 %
Platelets: 251 10*3/uL (ref 150–400)
RBC: 3.94 MIL/uL — ABNORMAL LOW (ref 4.22–5.81)
RDW: 14.9 % (ref 11.5–15.5)
WBC: 12.8 10*3/uL — ABNORMAL HIGH (ref 4.0–10.5)
nRBC: 0 % (ref 0.0–0.2)

## 2019-09-05 LAB — CULTURE, BLOOD (ROUTINE X 2)

## 2019-09-05 LAB — LACTIC ACID, PLASMA
Lactic Acid, Venous: 1.2 mmol/L (ref 0.5–1.9)
Lactic Acid, Venous: 1 mmol/L (ref 0.5–1.9)

## 2019-09-05 LAB — COMPREHENSIVE METABOLIC PANEL
ALT: 20 U/L (ref 0–44)
AST: 26 U/L (ref 15–41)
Albumin: 2 g/dL — ABNORMAL LOW (ref 3.5–5.0)
Alkaline Phosphatase: 36 U/L — ABNORMAL LOW (ref 38–126)
Anion gap: 10 (ref 5–15)
BUN: 31 mg/dL — ABNORMAL HIGH (ref 8–23)
CO2: 18 mmol/L — ABNORMAL LOW (ref 22–32)
Calcium: 8 mg/dL — ABNORMAL LOW (ref 8.9–10.3)
Chloride: 104 mmol/L (ref 98–111)
Creatinine, Ser: 1.96 mg/dL — ABNORMAL HIGH (ref 0.61–1.24)
GFR calc Af Amer: 35 mL/min — ABNORMAL LOW (ref >60)
GFR calc non Af Amer: 30 mL/min — ABNORMAL LOW (ref >60)
Glucose, Bld: 133 mg/dL — ABNORMAL HIGH (ref 70–99)
Potassium: 4.3 mmol/L (ref 3.5–5.1)
Sodium: 132 mmol/L — ABNORMAL LOW (ref 135–145)
Total Bilirubin: 0.5 mg/dL (ref 0.3–1.2)
Total Protein: 5 g/dL — ABNORMAL LOW (ref 6.5–8.1)

## 2019-09-05 LAB — GLUCOSE, CAPILLARY
Glucose-Capillary: 106 mg/dL — ABNORMAL HIGH (ref 70–99)
Glucose-Capillary: 129 mg/dL — ABNORMAL HIGH (ref 70–99)

## 2019-09-05 MED ORDER — SODIUM CHLORIDE 0.9 % IV SOLN: INTRAVENOUS | Status: DC

## 2019-09-05 MED ORDER — SODIUM CHLORIDE 0.9 % IV SOLN
2.0000 g | Freq: Two times a day (BID) | INTRAVENOUS | Status: DC
  Administered 2019-09-05 – 2019-09-21 (×32): 2 g via INTRAVENOUS
  Filled 2019-09-05 (×34): qty 2

## 2019-09-05 MED ORDER — DIPHENHYDRAMINE HCL 50 MG/ML IJ SOLN
12.5000 mg | Freq: Once | INTRAMUSCULAR | Status: AC
  Administered 2019-09-05: 12.5 mg via INTRAVENOUS
  Filled 2019-09-05: qty 1

## 2019-09-05 MED ORDER — PHENOL 1.4 % MT LIQD
1.0000 | OROMUCOSAL | Status: DC | PRN
  Administered 2019-09-05 – 2019-09-06 (×2): 1 via OROMUCOSAL
  Filled 2019-09-05: qty 177

## 2019-09-05 MED ORDER — SODIUM BICARBONATE 8.4 % IV SOLN
INTRAVENOUS | Status: DC
  Filled 2019-09-05 (×5): qty 1000

## 2019-09-05 NOTE — Progress Notes (Signed)
Pharmacy Antibiotic Note  [name] is a 86 y.o. male admitted on 09/01/2019 with intra-abdominal infection.  Pharmacy has been consulted for Cefepime dosing. WBC elevated at 10.6. SCR decreasing.   Plan: -Increase Cefepime to 2 gm IV Q 12 hours  -Monitor CBC, renal fx, cultures and clinical progress   Height: 6' (182.9 cm) Weight: 100.5 kg (221 lb 9 oz) IBW/kg (Calculated) : 77.6  Temp (24hrs), Avg:97.8 F (36.6 C), Min:97.8 F (36.6 C), Max:97.8 F (36.6 C)  Recent Labs  Lab 09/01/19 1835 09/02/19 0445 09/02/19 0812 09/02/19 1154 09/02/19 1652 09/03/19 0344 09/04/19 0411 09/05/19 0104  WBC 16.9* 20.0*  --   --   --  13.2* 10.6*  --   CREATININE 2.52*  --   --   --   --  2.49* 2.22* 1.96*  LATICACIDVEN  --  2.2* 2.1* 1.9 1.8  --   --   --     Estimated Creatinine Clearance: 34.4 mL/min (A) (by C-G formula based on SCr of 1.96 mg/dL (H)).    Allergies  Allergen Reactions  . Novocain [Procaine] Swelling    SWELLING REACTION UNSPECIFIED   . Amiodarone Nausea And Vomiting    Antimicrobials this admission: Cefepime 5/26 >> Metronidazole 5/26>>   Dose adjustments this admission: Cefepime 2gm q24 to 2gm IV q12h   Microbiology results: 5/26 BCx: GPR in 1/4 (contaminant?) 5/26 UCx:  Multiple species   [place] Fair[place]ough A. Levada Dy, PharmD, BCPS, FNKF Clinical Pharmacist [place] Please utilize Amion for appropriate phone number to reach the unit pharmacist ([place])

## 2019-09-05 NOTE — Progress Notes (Signed)
Charge RN received a call from tele stating patient had 18 beats of v-tach, went back to NSR, and then had 4 more beats of v-tach. This RN assessed pt and he denies pain and SOB. He states that he feels okay. Vitals were taken and are in chart. RN paged NP on call to make aware. RN will continue to monitor.   Eleanora Neighbor, RN

## 2019-09-05 NOTE — Progress Notes (Signed)
PROGRESS NOTE    [name]  SKA:[id number] DOB: 11/01/1932 DOA: 09/01/2019 PCP: [name], MD   Brief Narrative:  [name] is an 86 y.o. male with a past medical history significant for A. fib, TAVR, prediabetes, chronic kidney disease stage IV, chronic combined CHF, AICD placement, hypertension, CAD status post CABG admitted May 26 with severe diverticulitis.  Provided with gentle IV fluids and IV antibiotics.  He initially improved so he was started on clears but then his pain got worse.  Abdominal x-ray was obtained which showed ileus.  He was kept n.p.o.  Antibiotics initially were cefepime and then Flagyl were added.  Assessment & Plan:   Principal Problem:   Diverticulitis Active Problems:   Essential hypertension   Dyslipidemia   Persistent atrial fibrillation (HCC)   Stage 4 chronic kidney disease (HCC)   Chronic combined systolic (congestive) and diastolic (congestive) heart failure (HCC)   Cardiac resynchronization therapy defibrillator (CRT-D) in place   Acute urinary retention   Hyponatremia   Metabolic acidosis   Ileus ([place])  Acute severe diverticulitis/ileus: Patient is still feels the same.  Now he has more heartburn.  No nausea.  Claims that he is passing gas.  On exam he is more distended, diminished bowel sounds and firm and more tender today.  He refused NG tube placement yesterday.  After a lot of encouragement, he agrees to try 1 more time.  He remains afebrile.  No change in leukocytosis.  Continue current antibiotics, cefepime and Flagyl.  Hopefully after NG tube, he should feel better.  Keep him n.p.o. for now.  We will repeat x-ray abdomen today.  Chronic hyponatremia and metabolic acidosis: Sodium improving.  CO2 stable.  We will switch Ringer's lactate to normal saline with sodium bicarb.  Urinary retention: Resolved.  Chronic combined systolic and diastolic heart failure: Appears euvolemic.  Continue carvedilol.  Holding Lasix.  CKD  stage IV: At baseline.  Watch daily.  Chronic atrial fibrillation: Controlled.  Continue Eliquis and carvedilol.  Cardiac resynchronization therapy defibrillator in place. Telemetry shows two episodes of nonsustained V. Tach and aFive beat run and then 19 beat run 5/27. Asymptomatic. -Home meds as noted above -Monitor on telemetry  Essential hypertension: Very well controlled.  Continue current medications.  Hyperlipidemia: Continue statin.  DVT prophylaxis: Eliquis   Code Status: DNR  Family Communication:  None present at bedside.  Plan of care discussed with patient in length and he verbalized understanding and agreed with it. Patient is from: Home Disposition Plan: Home Barriers to discharge: Persistent severe diverticulitis and ileus  Status is: Inpatient  Remains inpatient appropriate because:IV treatments appropriate due to intensity of illness or inability to take PO   Dispo: The patient is from: Home              Anticipated d/c is to: Home              Anticipated d/c date is: 3 days              Patient currently is not medically stable to d/c.         Estimated body mass index is 30.05 kg/m as calculated from the following:   Height as of this encounter: 6' (1.829 m).   Weight as of this encounter: 100.5 kg.      Nutritional status:               Consultants:   None  Procedures:   None  Antimicrobials:  Anti-infectives (From admission, onward)   Start     Dose/Rate Route Frequency Ordered Stop   09/05/19 2200  ceFEPIme (MAXIPIME) 2 g in sodium chloride 0.9 % 100 mL IVPB     2 g 200 mL/hr over 30 Minutes Intravenous Every 12 hours 09/05/19 0847     09/03/19 0800  ceFEPIme (MAXIPIME) 2 g in sodium chloride 0.9 % 100 mL IVPB  Status:  Discontinued     2 g 200 mL/hr over 30 Minutes Intravenous Every 24 hours 09/02/19 0725 09/05/19 0847   09/02/19 1500  metroNIDAZOLE (FLAGYL) IVPB 500 mg     500 mg 100 mL/hr over 60 Minutes  Intravenous Every 8 hours 09/02/19 1121     09/02/19 0730  ceFEPIme (MAXIPIME) 2 g in sodium chloride 0.9 % 100 mL IVPB     2 g 200 mL/hr over 30 Minutes Intravenous  Once 09/02/19 0721 09/02/19 0900   09/02/19 0730  metroNIDAZOLE (FLAGYL) IVPB 500 mg     500 mg 100 mL/hr over 60 Minutes Intravenous  Once 09/02/19 0721 09/02/19 1056         Subjective: Patient seen and examined.  Continues to complain of pain with no change.  No nausea.  Not passing gas.  Objective: Vitals:   09/04/19 0822 09/04/19 1617 09/04/19 2039 09/05/19 0430  BP: (!) 126/59 (!) 140/55 124/66 137/72  Pulse: 62 (!) 59 74 73  Resp: _0 Temp: 98.5 F (36.9 C)  97.8 F (36.6 C) 97.8 F (36.6 C)  TempSrc: Oral  Oral Oral  SpO2: 98% 96% 96% 97%  Weight:   100.5 kg   Height:        Intake/Output Summary (Last 24 hours) at 09/05/2019 1128 Last data filed at 09/05/2019 3845 Gross per 24 hour  Intake 1303.77 ml  Output 1305 ml  Net -1.23 ml   Filed Weights   09/02/19 1508 09/04/19 2039  Weight: 93.5 kg 100.5 kg    Examination:  General exam: Appears calm and comfortable  Respiratory system: Clear to auscultation. Respiratory effort normal. Cardiovascular system: S1 & S2 heard, irregularly irregular rate and rhythm. No JVD, murmurs, rubs, gallops or clicks. No pedal edema. Gastrointestinal system: Abdomen is distended, firm, tender generalized with more on the right lower quadrant with positive guarding.   Central nervous system: Alert and oriented. No focal neurological deficits. Extremities: Symmetric 5 x 5 power. Skin: No rashes, lesions or ulcers Psychiatry: Judgement and insight appear normal. Mood & affect appropriate.    Data Reviewed: I have personally reviewed following labs and imaging studies  CBC: Recent Labs  Lab 09/01/19 1835 09/02/19 0445 09/03/19 0344 09/04/19 0411 09/05/19 0827  WBC 16.9* 20.0* 13.2* 10.6* 12.8*  NEUTROABS  --  16.2*  --   --  9.8*  HGB 13.3 13.0  11.0* 10.9* 11.8*  HCT 41.3 39.9 35.0* 33.6* 35.8*  MCV 94.9 95.2 96.2 92.8 90.9  PLT 288 227 181 196 364   Basic Metabolic Panel: Recent Labs  Lab 09/01/19 1835 09/03/19 0344 09/04/19 0411 09/05/19 0104  NA 133* 131* 130* 132*  K 4.8 4.6 4.4 4.3  CL 101 104 103 104  CO2 22 19* 19* 18*  GLUCOSE 155* 102* 124* 133*  BUN 28* 36* 37* 31*  CREATININE 2.52* 2.49* 2.22* 1.96*  CALCIUM 8.8* 7.7* 7.8* 8.0*  MG  --   --  1.8  --    GFR: Estimated Creatinine Clearance: 34.4 mL/min (A) (by C-G formula based on  SCr of 1.96 mg/dL (H)). Liver Function Tests: Recent Labs  Lab 09/01/19 1835 09/05/19 0104  AST 21 26  ALT 22 20  ALKPHOS 49 36*  BILITOT 1.2 0.5  PROT 6.4* 5.0*  ALBUMIN 3.2* 2.0*   Recent Labs  Lab 09/01/19 1835  LIPASE 22   No results for input(s): AMMONIA in the last 168 hours. Coagulation Profile: No results for input(s): INR, PROTIME in the last 168 hours. Cardiac Enzymes: No results for input(s): CKTOTAL, CKMB, CKMBINDEX, TROPONINI in the last 168 hours. BNP (last 3 results) No results for input(s): PROBNP in the last 8760 hours. HbA1C: No results for input(s): HGBA1C in the last 72 hours. CBG: Recent Labs  Lab 09/03/19 0545 09/03/19 0834 09/04/19 2037 09/05/19 0000 09/05/19 0428  GLUCAP 93 85 105* 106* 129*   Lipid Profile: No results for input(s): CHOL, HDL, LDLCALC, TRIG, CHOLHDL, LDLDIRECT in the last 72 hours. Thyroid Function Tests: No results for input(s): TSH, T4TOTAL, FREET4, T3FREE, THYROIDAB in the last 72 hours. Anemia Panel: No results for input(s): VITAMINB12, FOLATE, FERRITIN, TIBC, IRON, RETICCTPCT in the last 72 hours. Sepsis Labs: Recent Labs  Lab 09/02/19 0445 09/02/19 0812 09/02/19 1154 09/02/19 1652  PROCALCITON  --   --  3.38  --   LATICACIDVEN 2.2* 2.1* 1.9 1.8    Recent Results (from the past 240 hour(s))  Blood Culture (routine x 2)     Status: None (Preliminary result)   Collection Time: 09/02/19  8:10 AM    Specimen: BLOOD RIGHT ARM  Result Value Ref Range Status   Specimen Description BLOOD RIGHT ARM  Final   Special Requests   Final    BOTTLES DRAWN AEROBIC AND ANAEROBIC Blood Culture adequate volume   Culture   Final    NO GROWTH 3 DAYS Performed at [facility], [place] [address] [place], [place] [address]    Report Status PENDING  Incomplete  Blood Culture (routine x 2)     Status: Abnormal   Collection Time: 09/02/19  8:10 AM   Specimen: BLOOD  Result Value Ref Range Status   Specimen Description BLOOD RIGHT ANTECUBITAL  Final   Special Requests   Final    BOTTLES DRAWN AEROBIC AND ANAEROBIC Blood Culture results may not be optimal due to an excessive volume of blood received in culture bottles   Culture  Setup Time   Final    GRAM POSITIVE RODS ANAEROBIC BOTTLE ONLY CRITICAL RESULT CALLED TO, READ BACK BY AND VERIFIED WITH: PHARMD Milo [id number] FCP    Culture (A)  Final    DIPHTHEROIDS(CORYNEBACTERIUM SPECIES) Standardized susceptibility testing for this organism is not available. Performed at [facility], [place] [address], [place], [place] [address]    Report Status 09/05/2019 FINAL  Final  SARS Coronavirus 2 by RT PCR (hospital order, performed in [facility] hospital lab) Nasopharyngeal Nasopharyngeal Swab     Status: None   Collection Time: 09/02/19  8:12 AM   Specimen: Nasopharyngeal Swab  Result Value Ref Range Status   SARS Coronavirus 2 NEGATIVE NEGATIVE Final    Comment: (NOTE) SARS-CoV-2 target nucleic acids are NOT DETECTED. The SARS-CoV-2 RNA is generally detectable in upper and lower respiratory specimens during the acute phase of infection. The lowest concentration of SARS-CoV-2 viral copies this assay can detect is 250 copies / mL. A negative result does not preclude SARS-CoV-2 infection and should not be used as the sole basis for treatment or other patient management decisions.  A negative result may occur with improper specimen  collection / handling, submission of specimen other than nasopharyngeal swab, presence of viral mutation(s) within the areas targeted by this assay, and inadequate number of viral copies (<250 copies / mL). A negative result must be combined with clinical observations, patient history, and epidemiological information. Fact Sheet for Patients:   StrictlyIdeas.no Fact Sheet for Healthcare Providers: BankingDealers.co.za This test is not yet approved or cleared  by the [place] FDA and has been authorized for detection and/or diagnosis of SARS-CoV-2 by FDA under an Emergency Use Authorization (EUA).  This EUA will remain in effect (meaning this test can be used) for the duration of the COVID-19 declaration under Section 564(b)(1) of the Act, 21 U.S.C. section 360bbb-3(b)(1), unless the authorization is terminated or revoked sooner. Performed at [facility], [place] [address] [place], [place] [address]   Urine culture     Status: Abnormal   Collection Time: 09/02/19  9:45 AM   Specimen: In/Out Cath Urine  Result Value Ref Range Status   Specimen Description IN/OUT CATH URINE  Final   Special Requests   Final    NONE Performed at [facility], [place] [address] [place], [place] [address]    Culture MULTIPLE SPECIES PRESENT, SUGGEST RECOLLECTION (A)  Final   Report Status 09/03/2019 FINAL  Final      Radiology Studies: DG Abd 1 View  Result Date: 09/04/2019 CLINICAL DATA:  Abdominal bloating. EXAM: ABDOMEN - 1 VIEW COMPARISON:  March 12, 2008. FINDINGS: Air-filled large and small bowel loops are noted suggesting possible ileus. No radio-opaque calculi or other significant radiographic abnormality are seen. IMPRESSION: Air-filled large and small bowel loops are noted suggesting possible ileus. Electronically Signed   By: Marijo Conception M.D.   On: 09/04/2019 11:25    Scheduled Meds: . apixaban  2.5 mg Oral BID  .  atorvastatin  20 mg Oral QPM  . carvedilol  25 mg Oral BID WC  . docusate sodium  100 mg Oral BID  . famotidine  40 mg Oral QPM  . hydrALAZINE  12.5 mg Oral BID AC  . isosorbide mononitrate  30 mg Oral QPM  . mexiletine  300 mg Oral BID  . pantoprazole  40 mg Oral Daily  . sodium chloride flush  3 mL Intravenous Q12H  . sotalol  40 mg Oral BID   Continuous Infusions: . ceFEPime (MAXIPIME) IV    . lactated ringers 75 mL/hr at 09/05/19 0524  . metronidazole 500 mg (09/05/19 0823)     LOS: 3 days   Time spent: 37 minutes   Darliss Cheney, MD [facility]  09/05/2019, 11:28 AM   To contact the attending provider between 7A-7P or the covering provider during after hours 7P-7A, please log into the web site [email or web address].

## 2019-09-05 NOTE — Plan of Care (Signed)
  Problem: Education: Goal: Knowledge of General Education information will improve Description: Including pain rating scale, medication(s)/side effects and non-pharmacologic comfort measures Outcome: Progressing

## 2019-09-05 NOTE — Progress Notes (Addendum)
Patient has been having frequent runs of non-sustained V-Tach. HR fluctuates in the 60s-120s, paced rhythm, with occasional multiform and pair PVCs. Patient asymptomatic. [name] paged and made aware. Order for STAT CMP placed.

## 2019-09-05 NOTE — Progress Notes (Signed)
NG tube placed in left nare, patient tolerated well.

## 2019-09-06 DIAGNOSIS — I255 Ischemic cardiomyopathy: Secondary | ICD-10-CM

## 2019-09-06 DIAGNOSIS — I472 Ventricular tachycardia: Secondary | ICD-10-CM

## 2019-09-06 DIAGNOSIS — I480 Paroxysmal atrial fibrillation: Secondary | ICD-10-CM

## 2019-09-06 DIAGNOSIS — I2510 Atherosclerotic heart disease of native coronary artery without angina pectoris: Secondary | ICD-10-CM

## 2019-09-06 LAB — COMPREHENSIVE METABOLIC PANEL
ALT: 23 U/L (ref 0–44)
AST: 27 U/L (ref 15–41)
Albumin: 1.8 g/dL — ABNORMAL LOW (ref 3.5–5.0)
Alkaline Phosphatase: 38 U/L (ref 38–126)
Anion gap: 7 (ref 5–15)
BUN: 31 mg/dL — ABNORMAL HIGH (ref 8–23)
CO2: 22 mmol/L (ref 22–32)
Calcium: 7.7 mg/dL — ABNORMAL LOW (ref 8.9–10.3)
Chloride: 104 mmol/L (ref 98–111)
Creatinine, Ser: 1.79 mg/dL — ABNORMAL HIGH (ref 0.61–1.24)
GFR calc Af Amer: 39 mL/min — ABNORMAL LOW (ref >60)
GFR calc non Af Amer: 34 mL/min — ABNORMAL LOW (ref >60)
Glucose, Bld: 147 mg/dL — ABNORMAL HIGH (ref 70–99)
Potassium: 3.7 mmol/L (ref 3.5–5.1)
Sodium: 133 mmol/L — ABNORMAL LOW (ref 135–145)
Total Bilirubin: 0.7 mg/dL (ref 0.3–1.2)
Total Protein: 4.7 g/dL — ABNORMAL LOW (ref 6.5–8.1)

## 2019-09-06 LAB — CBC WITH DIFFERENTIAL/PLATELET
Abs Immature Granulocytes: 0.12 10*3/uL — ABNORMAL HIGH (ref 0.00–0.07)
Basophils Absolute: 0.1 10*3/uL (ref 0.0–0.1)
Basophils Relative: 1 %
Eosinophils Absolute: 0.2 10*3/uL (ref 0.0–0.5)
Eosinophils Relative: 2 %
HCT: 35.3 % — ABNORMAL LOW (ref 39.0–52.0)
Hemoglobin: 11.6 g/dL — ABNORMAL LOW (ref 13.0–17.0)
Immature Granulocytes: 1 %
Lymphocytes Relative: 17 %
Lymphs Abs: 1.6 10*3/uL (ref 0.7–4.0)
MCH: 30.1 pg (ref 26.0–34.0)
MCHC: 32.9 g/dL (ref 30.0–36.0)
MCV: 91.5 fL (ref 80.0–100.0)
Monocytes Absolute: 1.4 10*3/uL — ABNORMAL HIGH (ref 0.1–1.0)
Monocytes Relative: 15 %
Neutro Abs: 6.1 10*3/uL (ref 1.7–7.7)
Neutrophils Relative %: 64 %
Platelets: 264 10*3/uL (ref 150–400)
RBC: 3.86 MIL/uL — ABNORMAL LOW (ref 4.22–5.81)
RDW: 15 % (ref 11.5–15.5)
WBC: 9.4 10*3/uL (ref 4.0–10.5)
nRBC: 0 % (ref 0.0–0.2)

## 2019-09-06 LAB — MAGNESIUM: Magnesium: 1.9 mg/dL (ref 1.7–2.4)

## 2019-09-06 MED ORDER — LACTATED RINGERS IV SOLN: INTRAVENOUS | Status: DC

## 2019-09-06 NOTE — Consult Note (Addendum)
Cardiology Consultation:   Patient ID: [name] MRN: [id number]; DOB: 23-Jun-1932  Admit date: 09/01/2019 Date of Consult: 09/06/2019  Primary Care Provider: [name], MD Primary Cardiologist: [name], MD  Primary Electrophysiologist:  Constance Haw, MD    Patient Profile:   [name] is a 86 y.o. male with a hx of atrial fibrillation, CAD s/p CABG x 3 (2009) and AVR at that time, prediabetes, CKD stage IV, chronic combined systolic and diastolic heart failure in the setting of Afib RVR, dilated cardiomyopathy with EF 25-30% negative for amyloid on PYP scan, orthostatic hypotension, and recurrent right pleural effusion requiring Pleurx catheter which was removed in 2019 who is being seen today for the evaluation of NSVT at the request of Dr. Doristine Bosworth.  History of Present Illness:   [name] is on mexiletine for VT. He had a CRT-D upgraed 04/22/15. Sotalol was started for multiple episodes of ATP in Dec 2020, he was seen by Dr. Curt Bears on 08/06/19 and had no further shocks after initiation of sotalol.   He was admitted 09/02/19 with severe diverticulitis with elevated lactic acid. He was placed on gentle fluids and ABX. He initially improved, but worsened when started on liquid and subsequently found to have an ileus. NG tube placed. Pt was noted to have NSVT on telemetry that is becoming more frequent. Cardiology was consulted.   Pt presented to ED with severe abdominal pain. The pain is improving somewhat, but he has not had a BM in 5 days. He denies palpitations, chest pain, and dyspnea. He is taking pills by mouth with NG tube in place. He reports no episodes of recent ICD shocks.   Past Medical History:  Diagnosis Date  . Arthritis    "minor everywhere" (04/22/2015)  . Asthma    "a touch"  . Barrett esophagus   . CAD (coronary artery disease)    a. s/p 3 vvessel CABG with LIMA to LAD, SVG to diagonal 1, SVG to RCA 12/09.  . Cardiac resynchronization  therapy defibrillator (CRT-D) in place   . Chronic anticoagulation 01/23/2013  . Chronic combined systolic and diastolic CHF (congestive heart failure) (HCC)    dry weight 197-200lbs.  . CKD (chronic kidney disease) stage 3, GFR 30-59 ml/min 08/23/2014  . DCM (dilated cardiomyopathy) ([place])    initially concerned for TTP amyloidosis but Tc-PYP scan 06/2017 was not consistent with amyloid and SPEP/UPEP with no M spike. EF 20-25% by echo 2019  . Diverticulitis 08/2019  . Dyslipidemia 01/23/2013  . Essential hypertension 01/23/2013  . GERD (gastroesophageal reflux disease)   . GI bleed 05/08/2017  . H/O: rheumatic fever   . Hepatitis 1957   "don't know what kind:  . History of hiatal hernia   . History of PFTs    a. Amiodarone started 5/16 >> PFTs w/ DLCO 6/16:  FEV1 72% predicted, FEV1/FVC 66%, DLCO 66% >> minimal reversible obstructive airways disease with mild diffusion defect (suggestive of emphysema but absence of hyperinflation inconsistent with dx)  . Hyperkalemia 08/23/2014  . Hyperthyroidism 05/26/2017  . Leg weakness 01/28/2017  . Multiple thyroid nodules 08/13/2017  . Orthostatic hypotension   . Persistent atrial fibrillation ([place]) 01/23/2013  . Pleural effusion on right 05/26/2017  . Pneumonia 08/2014   "dr thought I may have had a touch"  . Pre-diabetes   . Protein-calorie malnutrition, severe 05/27/2017  . S/P AVR (aortic valve replacement) 2009   a. severe AS s/p AVR with pericardial tissue valve 2009.  Marland Kitchen  Shingles 08/23/2014  . VT (ventricular tachycardia) ([place]) 05/06/2017    Past Surgical History:  Procedure Laterality Date  . AORTIC VALVE REPLACEMENT  with 23-mm Magna Ease pericardial valve, model number   with 23-mm Magna Ease pericardial valve, model [id number], serial number [id number]   . APPENDECTOMY  1940s  . BI-VENTRICULAR IMPLANTABLE CARDIOVERTER DEFIBRILLATOR  (CRT-D)  04/22/2015  . CARDIAC CATHETERIZATION N/A 11/04/2014   Procedure: Left Heart Cath and Cors/Grafts  Angiography;  Surgeon: Leonie Man, MD;  Location: [place] [facility];  Service: Cardiovascular;  Laterality: N/A;  . CARDIAC VALVE REPLACEMENT    . CARDIOVERSION N/A 01/27/2013   Procedure: CARDIOVERSION;  Surgeon: Sueanne Margarita, MD;  Location: [place];  Service: Cardiovascular;  Laterality: N/A;  . CARDIOVERSION N/A 08/25/2014   Procedure: CARDIOVERSION;  Surgeon: Sanda Klein, MD;  Location: [place] [facility];  Service: Cardiovascular;  Laterality: N/A;  . CARDIOVERSION N/A 05/28/2018   Procedure: CARDIOVERSION;  Surgeon: Pixie Casino, MD;  Location: [facility];  Service: Cardiovascular;  Laterality: N/A;  . CATARACT EXTRACTION W/ INTRAOCULAR LENS  IMPLANT, BILATERAL Bilateral   . CHEST TUBE INSERTION Right 07/26/2017   Procedure: INSERTION PLEURAL DRAINAGE CATHETER - RIGHT;  Surgeon: Ivin Poot, MD;  Location: [place];  Service: Thoracic;  Laterality: Right;  . CORONARY ARTERY BYPASS GRAFT  03/10/2008   x 3 Dr. Roxan Hockey  . EP IMPLANTABLE DEVICE N/A 04/22/2015   Procedure: BiV ICD Insertion CRT-D;  Surgeon: [name], MD;  Location: [place] [facility];  Service: Cardiovascular;  Laterality: N/A;  . ESOPHAGOGASTRODUODENOSCOPY (EGD) WITH ESOPHAGEAL DILATION  X1  . ESOPHAGOGASTRODUODENOSCOPY (EGD) WITH PROPOFOL N/A 05/09/2017   Procedure: ESOPHAGOGASTRODUODENOSCOPY (EGD) WITH PROPOFOL;  Surgeon: Carol Ada, MD;  Location: [place];  Service: Endoscopy;  Laterality: N/A;  . IR THORACENTESIS ASP PLEURAL SPACE W/IMG GUIDE  05/27/2017  . IR THORACENTESIS ASP PLEURAL SPACE W/IMG GUIDE  06/17/2017  . IR THORACENTESIS ASP PLEURAL SPACE W/IMG GUIDE  06/18/2017  . IR THORACENTESIS ASP PLEURAL SPACE W/IMG GUIDE  07/11/2017  . REMOVAL OF PLEURAL DRAINAGE CATHETER Right 09/13/2017   Procedure: REMOVAL OF PLEURAL DRAINAGE CATHETER;  Surgeon: Ivin Poot, MD;  Location: [place];  Service: Thoracic;  Laterality: Right;  . RIGHT HEART CATH AND CORONARY/GRAFT ANGIOGRAPHY N/A  05/31/2017   Procedure: RIGHT HEART CATH AND CORONARY/GRAFT ANGIOGRAPHY;  Surgeon: Jolaine Artist, MD;  Location: [place] [facility];  Service: Cardiovascular;  Laterality: N/A;  . TONSILLECTOMY       Home Medications:  Prior to Admission medications   Medication Sig Start Date End Date Taking? Authorizing Provider  acetaminophen (TYLENOL) 325 MG tablet Take 325-650 mg by mouth every 6 (six) hours as needed for mild pain.    Yes [provider]  albuterol (PROVENTIL HFA;VENTOLIN HFA) 108 (90 Base) MCG/ACT inhaler Inhale 1-2 puffs into the lungs every 6 (six) hours as needed for wheezing.    Yes [provider]  atorvastatin (LIPITOR) 20 MG tablet TAKE 1 TABLET EVERY DAY  AT  6  PM Patient taking differently: Take 20 mg by mouth every evening.  06/24/19  Yes Camnitz, Will Hassell Done, MD  carvedilol (COREG) 25 MG tablet TAKE 1 TABLET TWICE DAILY (DOSE INCREASE) Patient taking differently: Take 25 mg by mouth 2 (two) times daily with a meal.  04/14/19  Yes Camnitz, Will Hassell Done, MD  ELIQUIS 2.5 MG TABS tablet TAKE 1 TABLET TWICE DAILY Patient taking differently: Take 2.5 mg by mouth 2 (two) times daily.  06/23/19  Yes Bensimhon, Shaune Pascal, MD  famotidine (PEPCID) 40 MG tablet Take 40 mg by mouth every evening.  07/01/18  Yes [provider]  furosemide (LASIX) 40 MG tablet TAKE 1 TABLET TWICE DAILY Patient taking differently: Take 40 mg by mouth daily.  08/27/18  Yes Turner, Eber Hong, MD  hydrALAZINE (APRESOLINE) 25 MG tablet TAKE 1/2 TABLET TWICE DAILY BEFORE MEALS. PLEASE KEEP UPCOMING APPT IN JANUARY WITH DR. Radford Pax FOR FUTURE REFILLS. Montara YOU Patient taking differently: Take 12.5 mg by mouth 2 (two) times daily before a meal.  06/24/19  Yes Turner, Traci R, MD  isosorbide mononitrate (IMDUR) 30 MG 24 hr tablet TAKE 1 TABLET EVERY DAY Patient taking differently: Take 30 mg by mouth every evening.  06/24/19  Yes Camnitz, Ocie Doyne, MD  mexiletine (MEXITIL) 150 MG capsule  Take 2 capsules (300 mg total) by mouth 3 (three) times daily. Patient taking differently: Take 300 mg by mouth 2 (two) times daily.  07/01/19  Yes Camnitz, Will Hassell Done, MD  Multiple Vitamin (MULTIVITAMIN WITH MINERALS) TABS tablet Take 1 tablet by mouth daily.    Yes [provider]  Omega-3 Fatty Acids (FISH OIL) 1000 MG CAPS Take 2,000 mg by mouth 2 (two) times daily.   Yes [provider]  pantoprazole (PROTONIX) 40 MG tablet Take 40 mg by mouth daily. 07/11/19  Yes [provider]  sotalol (BETAPACE) 80 MG tablet Take 0.5 tablets (40 mg total) by mouth 2 (two) times daily. 08/06/19  Yes Camnitz, Will Hassell Done, MD  traMADol (ULTRAM) 50 MG tablet Take 1 tablet (50 mg total) by mouth every 12 (twelve) hours as needed for moderate pain or severe pain. 05/10/17  Yes Rai, Ripudeep K, MD  ciprofloxacin (CIPRO) 500 MG tablet Take 500 mg by mouth 2 (two) times daily. 09/01/19   [provider]    Inpatient Medications: Scheduled Meds: . apixaban  2.5 mg Oral BID  . atorvastatin  20 mg Oral QPM  . carvedilol  25 mg Oral BID WC  . docusate sodium  100 mg Oral BID  . famotidine  40 mg Oral QPM  . hydrALAZINE  12.5 mg Oral BID AC  . isosorbide mononitrate  30 mg Oral QPM  . mexiletine  300 mg Oral BID  . pantoprazole  40 mg Oral Daily  . sodium chloride flush  3 mL Intravenous Q12H  . sotalol  40 mg Oral BID   Continuous Infusions: . ceFEPime (MAXIPIME) IV 2 g (09/06/19 0959)  . dextrose 5 % 1,000 mL with sodium bicarbonate 100 mEq infusion 75 mL/hr at 09/05/19 1251  . metronidazole 500 mg (09/06/19 0859)   PRN Meds: acetaminophen **OR** acetaminophen, albuterol, bisacodyl, diphenhydrAMINE, hydrALAZINE, HYDROcodone-acetaminophen, morphine injection, ondansetron **OR** ondansetron (ZOFRAN) IV, phenol, polyethylene glycol, zolpidem  Allergies:    Allergies  Allergen Reactions  . Novocain [Procaine] Swelling    SWELLING REACTION UNSPECIFIED   . Amiodarone Nausea  And Vomiting    Social History:   Social History   Socioeconomic History  . Marital status: Married    Spouse name: Not on file  . Number of children: Not on file  . Years of education: Not on file  . Highest education level: Not on file  Occupational History  . Not on file  Tobacco Use  . Smoking status: Never Smoker  . Smokeless tobacco: Never Used  Substance and Sexual Activity  . Alcohol use: Not Currently    Comment: 04/22/2015 "nothing since the 1980s; never  had a problem w/it"  . Drug use: No  . Sexual activity: Not Currently  Other Topics Concern  . Not on file  Social History Narrative   Married, lives with spouse [name]   4 children, 2 boys and 2 girls > one daughter passed   OCCUPATION: retired, Pension scheme manager for [employer] Strain:   . Difficulty of Paying Living Expenses:   Food Insecurity:   . Worried About Charity fundraiser in the Last Year:   . Arboriculturist in the Last Year:   Transportation Needs:   . Film/video editor (Medical):   Marland Kitchen Lack of Transportation (Non-Medical):   Physical Activity:   . Days of Exercise per Week:   . Minutes of Exercise per Session:   Stress:   . Feeling of Stress :   Social Connections:   . Frequency of Communication with Friends and Family:   . Frequency of Social Gatherings with Friends and Family:   . Attends Religious Services:   . Active Member of Clubs or Organizations:   . Attends Archivist Meetings:   Marland Kitchen Marital Status:   Intimate Partner Violence:   . Fear of Current or Ex-Partner:   . Emotionally Abused:   Marland Kitchen Physically Abused:   . Sexually Abused:     Family History:    Family History  Problem Relation Age of Onset  . Alzheimer's disease Mother   . COPD Daughter   . Alzheimer's disease Other   . COPD Sister   . Depression Sister   . Heart disease Sister   . Hyperlipidemia Sister   . Hypertension Sister   .  Esophageal cancer Sister        + smoker     ROS:  Please see the history of present illness.   All other ROS reviewed and negative.     Physical Exam/Data:   Vitals:   09/05/19 2041 09/06/19 0500 09/06/19 0555 09/06/19 0900  BP: (!) 162/69  127/65 133/69  Pulse: 73  65 72  Resp: _0 Temp: 98 F (36.7 C)  97.7 F (36.5 C) 98 F (36.7 C)  TempSrc: Oral  Oral Oral  SpO2: 96%  96% 98%  Weight: 99 kg 99 kg    Height:        Intake/Output Summary (Last 24 hours) at 09/06/2019 1242 Last data filed at 09/06/2019 1058 Gross per 24 hour  Intake 1219.9 ml  Output 2050 ml  Net -830.1 ml   Last 3 Weights 09/06/2019 09/05/2019 09/04/2019  Weight (lbs) 218 lb 4.1 oz 218 lb 4.1 oz 221 lb 9 oz  Weight (kg) 99 kg 99 kg 100.5 kg     Body mass index is 29.6 kg/m.  General:  Elderly male, no acute distress, but uncomfortable HEENT: normal Neck: no JVD Vascular: No carotid bruits  Cardiac:  normal S1, S2; RRR; no murmur  Lungs:  clear to auscultation bilaterally, no wheezing, rhonchi or rales  Abd: soft, nontender, no hepatomegaly  Ext: no edema Musculoskeletal:  No deformities, BUE and BLE strength normal and equal Skin: warm and dry  Neuro:  CNs 2-12 intact, no focal abnormalities noted Psych:  Normal affect   EKG:  The EKG was personally reviewed and demonstrates:  Paced rhythm Telemetry:  Telemetry was personally reviewed and demonstrates:  Paced rhythm with runs of NSVT and evidence of ATP  Relevant CV Studies:  Right and left heart cath 05/31/17:  Prox LAD to Mid LAD lesion is 50% stenosed.  Mid LAD to Dist LAD lesion is 25% stenosed.  1st Mrg lesion is 50% stenosed.  Prox RCA to Dist RCA lesion is 100% stenosed.  And is large.  Prox Graft to Mid Graft lesion is 30% stenosed.  And is large.  The flow in the graft is reversed.  There is competitive flow.  And is large.  There is competitive flow.  Ost 1st Diag to 1st Diag lesion is 90% stenosed.    Findings:  Ao = 145/67 (99)  RA = 6 RV = 70/9 PA = 69/30 (46) PCW = 26 v = 37 Fick cardiac output/index =4.7/2.3 PVR = 4.3 FA sat = 98% PA sat = 63%, 60%  Assessment:  1. 3v CAD with stable revascularization with patent LIMA-LAD, SVG-diagonal and SVG-RCA 2. 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 Normal cardiac output - no evidence of thyrotoxicosis heart disease or [place]   Laboratory Data:  High Sensitivity Troponin:  No results for input(s): TROPONINIHS in the last 720 hours.   Chemistry Recent Labs  Lab 09/04/19 0411 09/05/19 0104 09/06/19 0545  NA 130* 132* 133*  K 4.4 4.3 3.7  CL 103 104 104  CO2 19* 18* 22  GLUCOSE 124* 133* 147*  BUN 37* 31* 31*  CREATININE 2.22* 1.96* 1.79*  CALCIUM 7.8* 8.0* 7.7*  GFRNONAA 26* 30* 34*  GFRAA 30* 35* 39*  ANIONGAP _0 Recent Labs  Lab 09/01/19 1835 09/05/19 0104 09/06/19 0545  PROT 6.4* 5.0* 4.7*  ALBUMIN 3.2* 2.0* 1.8*  AST _1 ALT _2 ALKPHOS 49 36* 38  BILITOT 1.2 0.5 0.7   Hematology Recent Labs  Lab 09/04/19 0411 09/05/19 0827 09/06/19 0545  WBC 10.6* 12.8* 9.4  RBC 3.62* 3.94* 3.86*  HGB 10.9* 11.8* 11.6*  HCT 33.6* 35.8* 35.3*  MCV 92.8 90.9 91.5  MCH 30.1 29.9 30.1  MCHC 32.4 33.0 32.9  RDW 14.6 14.9 15.0  PLT 196 251 264   BNPNo results for input(s): BNP, PROBNP in the last 168 hours.  DDimer No results for input(s): DDIMER in the last 168 hours.   Radiology/Studies:  DG Abd 1 View  Result Date: 09/05/2019 CLINICAL DATA:  Follow-up abdominal distension and pain. EXAM: ABDOMEN - 1 VIEW COMPARISON:  09/05/2019 radiograph and prior studies FINDINGS: An NG tube is again identified with tip overlying the proximal-mid stomach. Dilated small bowel loops appear slightly increased in caliber from 09/04/2019. Oral contrast within the proximal colon is present. No other significant change noted. IMPRESSION: Slightly increased  small bowel dilatation from 09/04/2019 which may represent a slightly worsening ileus. Electronically Signed   By: Margarette Canada M.D.   On: 09/05/2019 13:26   DG Abd 1 View  Result Date: 09/05/2019 CLINICAL DATA:  nasogastric (NG) tube placement EXAM: ABDOMEN - 1 VIEW COMPARISON:  Abdominal radiograph 09/04/2019 FINDINGS: Interval placement of a nasogastric tube with side port likely just beyond the GE junction. Persistent diffuse gaseous distention of the visualized bowel loops. IMPRESSION: Interval placement of nasogastric tube with side port likely just beyond the GE junction. Advancement of approximately 3 cm could be considered. Electronically Signed   By: Audie Pinto M.D.   On: 09/05/2019 12:10   DG Abd 1 View  Result Date: 09/04/2019 CLINICAL DATA:  Abdominal bloating. EXAM: ABDOMEN -  1 VIEW COMPARISON:  March 12, 2008. FINDINGS: Air-filled large and small bowel loops are noted suggesting possible ileus. No radio-opaque calculi or other significant radiographic abnormality are seen. IMPRESSION: Air-filled large and small bowel loops are noted suggesting possible ileus. Electronically Signed   By: Marijo Conception M.D.   On: 09/04/2019 11:25   {   Assessment and Plan:   NSVT, episodes of ATP - maintained on mexiletine and sotalol - Mg 1.9, K 3.7 - suspect PO medications are not being fully absorbed given NG tube in place and ongoing ileus - additionally, illness/infection likely increasing SNS response which may be driving some of his VT - pt has not tolerated amiodarone in the past - given renal function, will not make medication changes today - NSVT will likely resolve once his abdominal illness resolves - will have EP see tomorrow for further recommendations and consideration of short term IV medications   Atrial fibrillation  - medications as above - anticoagulated with reduced dose of eliquis 2.5 mg BID   Hypertension - continue hydralazine, imdur, BB - hx of  orthostatic hypotension   Dilated cardiomyopathy Chronic systolic and diastolic heart failure - continue home diuretic   CAD s/p CABG (2009) - continue imdur, statin   CKD stage IV - sCr 1.79 - baseline appears to be 2.3-2.5 range      For questions or updates, please contact [facility] Please consult [email or web address] for contact info under     Signed, Ledora Bottcher, PA  09/06/2019 12:42 PM As above, patient seen and examined.  Briefly he is an 86 year old male with past medical history significant for paroxysmal atrial fibrillation, prior TAVR, chronic stage IV kidney disease, chronic combined systolic/diastolic congestive heart failure, prior ICD, coronary artery disease status post coronary bypass and graft, ischemic cardiomyopathy, hypertension admitted with diverticulitis for evaluation of ventricular tachycardia.  Most recent echocardiogram February 2019 showed ejection fraction 20 to 25%, prior aortic valve replacement with mean gradient 10 mmHg, mild mitral regurgitation, severe left atrial enlargement.  Patient is followed by Dr. Curt Bears and has a history of ventricular tachycardia.  He is treated with mexiletine and sotalol.  He had nausea and vomiting with amiodarone in the past.  Patient was admitted with abdominal pain on May 26 and diagnosed with severe diverticulitis.  He has been treated with antibiotics and has been kept n.p.o.  He has also developed an ileus.  On telemetry he has been noted to have runs of ventricular tachycardia that is paced terminated.  He denies dyspnea or chest pain.  No palpitations.  No syncope.  Cardiology now asked to evaluate. Laboratory showed potassium 3.7, BUN 31 with creatinine 1.79, magnesium 1.9 and hemoglobin 11.6.  Electrocardiogram shows sinus with ventricular pacing.  1 ventricular tachycardia-patient noted to have multiple runs of ventricular tachycardia paced terminated.  He has a known history of this and is typically  treated with mexiletine, sotalol and carvedilol. Present rhythm likely exacerbated by hyperadrenergic state associated with diverticulitis/ileus. He is also being given oral medications but likely has poor absorption related to ongoing abdominal process. We will continue present medications at this point and follow on telemetry. Keep potassium at 4.0 or greater and magnesium 2.0 or greater.  It should be noted he did not tolerate amiodarone in the past. We will arrange repeat echocardiogram.  We will have electrophysiology follow beginning tomorrow.  2 ischemic cardiomyopathy-continue hydralazine/nitrates.  No ARB or Entresto due to renal insufficiency.  Continue beta-blocker.  3 paroxysmal  atrial fibrillation-patient is in sinus rhythm.  Continue apixaban at present dose.  Continue carvedilol and sotalol.  4 prior aortic valve replacement  5 diverticulitis-Per primary care.  6 coronary artery disease status post coronary artery bypass and graft-continue statin.  No aspirin given need for Eliquis.  Kirk Ruths, MD

## 2019-09-06 NOTE — Progress Notes (Signed)
Patient had multiple beats of Vtach so far, cardiology and attending are aware of it.  Patient is asymptomatic, will continue to monitor.

## 2019-09-06 NOTE — Progress Notes (Addendum)
PROGRESS NOTE    [name]  ZOX:[id number] DOB: 1932-07-03 DOA: 09/01/2019 PCP: [name], MD   Brief Narrative:  [name] is an 86 y.o. male with a past medical history significant for A. fib, TAVR, prediabetes, chronic kidney disease stage IV, chronic combined CHF, AICD placement, hypertension, CAD status post CABG admitted May 26 with severe diverticulitis.  Provided with gentle IV fluids and IV antibiotics.  He initially improved so he was started on clears but then his pain got worse.  Abdominal x-ray was obtained which showed ileus.  He was kept n.p.o.  Antibiotics initially were cefepime and then Flagyl were added.  Assessment & Plan:   Principal Problem:   Diverticulitis Active Problems:   Essential hypertension   Dyslipidemia   Persistent atrial fibrillation (HCC)   Stage 4 chronic kidney disease (HCC)   Chronic combined systolic (congestive) and diastolic (congestive) heart failure (HCC)   Cardiac resynchronization therapy defibrillator (CRT-D) in place   Acute urinary retention   Hyponatremia   Metabolic acidosis   Ileus (HCC)  Acute severe diverticulitis/ileus: Patient now feels "a lot better " after NG tube was placed yesterday.  Pain is only 2 out of 10.  No nausea but is still not passing gas.  Lactic acid negative.  Leukocytosis improved.  Hemodynamically stable.  Continue NG tube to LIS for another day.  Continue IV fluids and pain management as well as  Continue current antibiotics, cefepime and Flagyl.  Abdomen feels softer and less distended.  Will skip any x-ray for today.  Chronic hyponatremia and metabolic acidosis: Improving.  Will stop bicarb drip.  Switch to Ringer's lactate 100 cc/h.  Urinary retention: Resolved.  Chronic combined systolic and diastolic heart failure: Appears euvolemic.  Continue carvedilol.  Holding Lasix.  CKD stage IV: At baseline.  Watch daily.  Chronic atrial fibrillation: Controlled.  Continue Eliquis and  carvedilol.  Cardiac resynchronization therapy defibrillator in place.  Patient has been having intermittent nonsustained V. tach's which has been getting more frequent however he remains asymptomatic.  Continue to monitor on telemetry.  Consulted cardiology.  Essential hypertension: Very well controlled.  Continue current medications.  Hyperlipidemia: Continue statin.  DVT prophylaxis: Eliquis   Code Status: DNR  Family Communication:  None present at bedside.  Plan of care discussed with patient in length and he verbalized understanding and agreed with it.  Called and discussed with patient's daughter over the phone yesterday. Patient is from: Home Disposition Plan: Home Barriers to discharge: Persistent severe diverticulitis and ileus  Status is: Inpatient  Remains inpatient appropriate because:IV treatments appropriate due to intensity of illness or inability to take PO   Dispo: The patient is from: Home              Anticipated d/c is to: Home              Anticipated d/c date is: 3 days              Patient currently is not medically stable to d/c.         Estimated body mass index is 29.6 kg/m as calculated from the following:   Height as of this encounter: 6' (1.829 m).   Weight as of this encounter: 99 kg.      Nutritional status:               Consultants:   None  Procedures:   None  Antimicrobials:  Anti-infectives (From admission, onward)   Start  Dose/Rate Route Frequency Ordered Stop   09/05/19 2200  ceFEPIme (MAXIPIME) 2 g in sodium chloride 0.9 % 100 mL IVPB     2 g 200 mL/hr over 30 Minutes Intravenous Every 12 hours 09/05/19 0847     09/03/19 0800  ceFEPIme (MAXIPIME) 2 g in sodium chloride 0.9 % 100 mL IVPB  Status:  Discontinued     2 g 200 mL/hr over 30 Minutes Intravenous Every 24 hours 09/02/19 0725 09/05/19 0847   09/02/19 1500  metroNIDAZOLE (FLAGYL) IVPB 500 mg     500 mg 100 mL/hr over 60 Minutes Intravenous Every 8  hours 09/02/19 1121     09/02/19 0730  ceFEPIme (MAXIPIME) 2 g in sodium chloride 0.9 % 100 mL IVPB     2 g 200 mL/hr over 30 Minutes Intravenous  Once 09/02/19 0721 09/02/19 0900   09/02/19 0730  metroNIDAZOLE (FLAGYL) IVPB 500 mg     500 mg 100 mL/hr over 60 Minutes Intravenous  Once 09/02/19 0721 09/02/19 1056         Subjective: Patient seen and examined.  He states that he feels " a lot" better.  Pain is only 2 out of 10.  No nausea.  Not passing gas.  No other complaint.  Objective: Vitals:   09/05/19 2041 09/06/19 0500 09/06/19 0555 09/06/19 0900  BP: (!) 162/69  127/65 133/69  Pulse: 73  65 72  Resp: _0 Temp: 98 F (36.7 C)  97.7 F (36.5 C) 98 F (36.7 C)  TempSrc: Oral  Oral Oral  SpO2: 96%  96% 98%  Weight: 99 kg 99 kg    Height:        Intake/Output Summary (Last 24 hours) at 09/06/2019 1046 Last data filed at 09/06/2019 0936 Gross per 24 hour  Intake 1219.9 ml  Output 2030 ml  Net -810.1 ml   Filed Weights   09/04/19 2039 09/05/19 2041 09/06/19 0500  Weight: 100.5 kg 99 kg 99 kg    Examination:  General exam: Appears calm and comfortable  Respiratory system: Clear to auscultation. Respiratory effort normal. Cardiovascular system: S1 & S2 heard, RRR. No JVD, murmurs, rubs, gallops or clicks. No pedal edema. Gastrointestinal system: Abdomen is minimally distended, soft and minimally tender at suprapubic and right lower quadrant. No organomegaly or masses felt. Normal bowel sounds heard. Central nervous system: Alert and oriented. No focal neurological deficits. Extremities: Symmetric 5 x 5 power. Skin: No rashes, lesions or ulcers.  Psychiatry: Judgement and insight appear normal. Mood & affect appropriate.     Data Reviewed: I have personally reviewed following labs and imaging studies  CBC: Recent Labs  Lab 09/02/19 0445 09/03/19 0344 09/04/19 0411 09/05/19 0827 09/06/19 0545  WBC 20.0* 13.2* 10.6* 12.8* 9.4  NEUTROABS 16.2*  --    --  9.8* 6.1  HGB 13.0 11.0* 10.9* 11.8* 11.6*  HCT 39.9 35.0* 33.6* 35.8* 35.3*  MCV 95.2 96.2 92.8 90.9 91.5  PLT 227 181 196 251 962   Basic Metabolic Panel: Recent Labs  Lab 09/01/19 1835 09/03/19 0344 09/04/19 0411 09/05/19 0104 09/06/19 0545  NA 133* 131* 130* 132* 133*  K 4.8 4.6 4.4 4.3 3.7  CL 101 104 103 104 104  CO2 22 19* 19* 18* 22  GLUCOSE 155* 102* 124* 133* 147*  BUN 28* 36* 37* 31* 31*  CREATININE 2.52* 2.49* 2.22* 1.96* 1.79*  CALCIUM 8.8* 7.7* 7.8* 8.0* 7.7*  MG  --   --  1.8  --  1.9   GFR: Estimated Creatinine Clearance: 37.5 mL/min (A) (by C-G formula based on SCr of 1.79 mg/dL (H)). Liver Function Tests: Recent Labs  Lab 09/01/19 1835 09/05/19 0104 09/06/19 0545  AST _0 ALT _1 ALKPHOS 49 36* 38  BILITOT 1.2 0.5 0.7  PROT 6.4* 5.0* 4.7*  ALBUMIN 3.2* 2.0* 1.8*   Recent Labs  Lab 09/01/19 1835  LIPASE 22   No results for input(s): AMMONIA in the last 168 hours. Coagulation Profile: No results for input(s): INR, PROTIME in the last 168 hours. Cardiac Enzymes: No results for input(s): CKTOTAL, CKMB, CKMBINDEX, TROPONINI in the last 168 hours. BNP (last 3 results) No results for input(s): PROBNP in the last 8760 hours. HbA1C: No results for input(s): HGBA1C in the last 72 hours. CBG: Recent Labs  Lab 09/03/19 0545 09/03/19 0834 09/04/19 2037 09/05/19 0000 09/05/19 0428  GLUCAP 93 85 105* 106* 129*   Lipid Profile: No results for input(s): CHOL, HDL, LDLCALC, TRIG, CHOLHDL, LDLDIRECT in the last 72 hours. Thyroid Function Tests: No results for input(s): TSH, T4TOTAL, FREET4, T3FREE, THYROIDAB in the last 72 hours. Anemia Panel: No results for input(s): VITAMINB12, FOLATE, FERRITIN, TIBC, IRON, RETICCTPCT in the last 72 hours. Sepsis Labs: Recent Labs  Lab 09/02/19 1154 09/02/19 1652 09/05/19 1143 09/05/19 1502  PROCALCITON 3.38  --   --   --   LATICACIDVEN 1.9 1.8 1.2 1.0    Recent Results (from the past  240 hour(s))  Blood Culture (routine x 2)     Status: None (Preliminary result)   Collection Time: 09/02/19  8:10 AM   Specimen: BLOOD RIGHT ARM  Result Value Ref Range Status   Specimen Description BLOOD RIGHT ARM  Final   Special Requests   Final    BOTTLES DRAWN AEROBIC AND ANAEROBIC Blood Culture adequate volume   Culture   Final    NO GROWTH 4 DAYS Performed at [facility], [place] [address], [place], [place] [address]    Report Status PENDING  Incomplete  Blood Culture (routine x 2)     Status: Abnormal   Collection Time: 09/02/19  8:10 AM   Specimen: BLOOD  Result Value Ref Range Status   Specimen Description BLOOD RIGHT ANTECUBITAL  Final   Special Requests   Final    BOTTLES DRAWN AEROBIC AND ANAEROBIC Blood Culture results may not be optimal due to an excessive volume of blood received in culture bottles   Culture  Setup Time   Final    GRAM POSITIVE RODS ANAEROBIC BOTTLE ONLY CRITICAL RESULT CALLED TO, READ BACK BY AND VERIFIED WITH: PHARMD [place] [id number] FCP    Culture (A)  Final    DIPHTHEROIDS(CORYNEBACTERIUM SPECIES) Standardized susceptibility testing for this organism is not available. Performed at [facility], [place] [address], [place], [place] [address]    Report Status 09/05/2019 FINAL  Final  SARS Coronavirus 2 by RT PCR (hospital order, performed in [facility] hospital lab) Nasopharyngeal Nasopharyngeal Swab     Status: None   Collection Time: 09/02/19  8:12 AM   Specimen: Nasopharyngeal Swab  Result Value Ref Range Status   SARS Coronavirus 2 NEGATIVE NEGATIVE Final    Comment: (NOTE) SARS-CoV-2 target nucleic acids are NOT DETECTED. The SARS-CoV-2 RNA is generally detectable in upper and lower respiratory specimens during the acute phase of infection. The lowest concentration of SARS-CoV-2 viral copies this assay can detect is 250 copies / mL. A  negative result does not preclude SARS-CoV-2 infection and should not be used as the  sole basis for treatment or other patient management decisions.  A negative result may occur with improper specimen collection / handling, submission of specimen other than nasopharyngeal swab, presence of viral mutation(s) within the areas targeted by this assay, and inadequate number of viral copies (<250 copies / mL). A negative result must be combined with clinical observations, patient history, and epidemiological information. Fact Sheet for Patients:   StrictlyIdeas.no Fact Sheet for Healthcare Providers: BankingDealers.co.za This test is not yet approved or cleared  by the [place] FDA and has been authorized for detection and/or diagnosis of SARS-CoV-2 by FDA under an Emergency Use Authorization (EUA).  This EUA will remain in effect (meaning this test can be used) for the duration of the COVID-19 declaration under Section 564(b)(1) of the Act, 21 U.S.C. section 360bbb-3(b)(1), unless the authorization is terminated or revoked sooner. Performed at [facility], [place] [address] [place], [place] [address]   Urine culture     Status: Abnormal   Collection Time: 09/02/19  9:45 AM   Specimen: In/Out Cath Urine  Result Value Ref Range Status   Specimen Description IN/OUT CATH URINE  Final   Special Requests   Final    NONE Performed at [facility], [place] [address], [place], [place] [address]    Culture MULTIPLE SPECIES PRESENT, SUGGEST RECOLLECTION (A)  Final   Report Status 09/03/2019 FINAL  Final      Radiology Studies: DG Abd 1 View  Result Date: 09/05/2019 CLINICAL DATA:  Follow-up abdominal distension and pain. EXAM: ABDOMEN - 1 VIEW COMPARISON:  09/05/2019 radiograph and prior studies FINDINGS: An NG tube is again identified with tip overlying the proximal-mid stomach. Dilated small bowel loops appear slightly increased in caliber from 09/04/2019. Oral contrast within the proximal colon is present. No other  significant change noted. IMPRESSION: Slightly increased small bowel dilatation from 09/04/2019 which may represent a slightly worsening ileus. Electronically Signed   By: Margarette Canada M.D.   On: 09/05/2019 13:26   DG Abd 1 View  Result Date: 09/05/2019 CLINICAL DATA:  nasogastric (NG) tube placement EXAM: ABDOMEN - 1 VIEW COMPARISON:  Abdominal radiograph 09/04/2019 FINDINGS: Interval placement of a nasogastric tube with side port likely just beyond the GE junction. Persistent diffuse gaseous distention of the visualized bowel loops. IMPRESSION: Interval placement of nasogastric tube with side port likely just beyond the GE junction. Advancement of approximately 3 cm could be considered. Electronically Signed   By: Audie Pinto M.D.   On: 09/05/2019 12:10   DG Abd 1 View  Result Date: 09/04/2019 CLINICAL DATA:  Abdominal bloating. EXAM: ABDOMEN - 1 VIEW COMPARISON:  March 12, 2008. FINDINGS: Air-filled large and small bowel loops are noted suggesting possible ileus. No radio-opaque calculi or other significant radiographic abnormality are seen. IMPRESSION: Air-filled large and small bowel loops are noted suggesting possible ileus. Electronically Signed   By: Marijo Conception M.D.   On: 09/04/2019 11:25    Scheduled Meds: . apixaban  2.5 mg Oral BID  . atorvastatin  20 mg Oral QPM  . carvedilol  25 mg Oral BID WC  . docusate sodium  100 mg Oral BID  . famotidine  40 mg Oral QPM  . hydrALAZINE  12.5 mg Oral BID AC  . isosorbide mononitrate  30 mg Oral QPM  . mexiletine  300 mg Oral BID  . pantoprazole  40 mg  Oral Daily  . sodium chloride flush  3 mL Intravenous Q12H  . sotalol  40 mg Oral BID   Continuous Infusions: . ceFEPime (MAXIPIME) IV 2 g (09/06/19 0959)  . dextrose 5 % 1,000 mL with sodium bicarbonate 100 mEq infusion 75 mL/hr at 09/05/19 1251  . metronidazole 500 mg (09/06/19 0859)     LOS: 4 days   Time spent: 30 minutes   Darliss Cheney, MD [facility]  09/06/2019, 10:46 AM   To contact the attending provider between 7A-7P or the covering provider during after hours 7P-7A, please log into the web site [email or web address].

## 2019-09-07 ENCOUNTER — Inpatient Hospital Stay (HOSPITAL_COMMUNITY): Payer: Medicare HMO

## 2019-09-07 DIAGNOSIS — I5021 Acute systolic (congestive) heart failure: Secondary | ICD-10-CM

## 2019-09-07 DIAGNOSIS — I130 Hypertensive heart and chronic kidney disease with heart failure and stage 1 through stage 4 chronic kidney disease, or unspecified chronic kidney disease: Secondary | ICD-10-CM

## 2019-09-07 DIAGNOSIS — I472 Ventricular tachycardia: Secondary | ICD-10-CM

## 2019-09-07 LAB — CBC WITH DIFFERENTIAL/PLATELET
Abs Immature Granulocytes: 0.28 10*3/uL — ABNORMAL HIGH (ref 0.00–0.07)
Basophils Absolute: 0.1 10*3/uL (ref 0.0–0.1)
Basophils Relative: 1 %
Eosinophils Absolute: 0.2 10*3/uL (ref 0.0–0.5)
Eosinophils Relative: 1 %
HCT: 37.9 % — ABNORMAL LOW (ref 39.0–52.0)
Hemoglobin: 12.4 g/dL — ABNORMAL LOW (ref 13.0–17.0)
Immature Granulocytes: 2 %
Lymphocytes Relative: 14 %
Lymphs Abs: 1.9 10*3/uL (ref 0.7–4.0)
MCH: 29.8 pg (ref 26.0–34.0)
MCHC: 32.7 g/dL (ref 30.0–36.0)
MCV: 91.1 fL (ref 80.0–100.0)
Monocytes Absolute: 2 10*3/uL — ABNORMAL HIGH (ref 0.1–1.0)
Monocytes Relative: 16 %
Neutro Abs: 8.5 10*3/uL — ABNORMAL HIGH (ref 1.7–7.7)
Neutrophils Relative %: 66 %
Platelets: 311 10*3/uL (ref 150–400)
RBC: 4.16 MIL/uL — ABNORMAL LOW (ref 4.22–5.81)
RDW: 14.7 % (ref 11.5–15.5)
WBC: 12.9 10*3/uL — ABNORMAL HIGH (ref 4.0–10.5)
nRBC: 0 % (ref 0.0–0.2)

## 2019-09-07 LAB — COMPREHENSIVE METABOLIC PANEL
ALT: 25 U/L (ref 0–44)
AST: 27 U/L (ref 15–41)
Albumin: 1.9 g/dL — ABNORMAL LOW (ref 3.5–5.0)
Alkaline Phosphatase: 39 U/L (ref 38–126)
Anion gap: 10 (ref 5–15)
BUN: 27 mg/dL — ABNORMAL HIGH (ref 8–23)
CO2: 20 mmol/L — ABNORMAL LOW (ref 22–32)
Calcium: 7.8 mg/dL — ABNORMAL LOW (ref 8.9–10.3)
Chloride: 105 mmol/L (ref 98–111)
Creatinine, Ser: 1.67 mg/dL — ABNORMAL HIGH (ref 0.61–1.24)
GFR calc Af Amer: 43 mL/min — ABNORMAL LOW (ref >60)
GFR calc non Af Amer: 37 mL/min — ABNORMAL LOW (ref >60)
Glucose, Bld: 126 mg/dL — ABNORMAL HIGH (ref 70–99)
Potassium: 4.1 mmol/L (ref 3.5–5.1)
Sodium: 135 mmol/L (ref 135–145)
Total Bilirubin: 0.9 mg/dL (ref 0.3–1.2)
Total Protein: 5 g/dL — ABNORMAL LOW (ref 6.5–8.1)

## 2019-09-07 LAB — MAGNESIUM: Magnesium: 2 mg/dL (ref 1.7–2.4)

## 2019-09-07 LAB — CULTURE, BLOOD (ROUTINE X 2)
Culture: NO GROWTH
Special Requests: ADEQUATE

## 2019-09-07 LAB — LACTIC ACID, PLASMA: Lactic Acid, Venous: 1.3 mmol/L (ref 0.5–1.9)

## 2019-09-07 LAB — ECHOCARDIOGRAM COMPLETE
Height: 72 in
Weight: 3488.56 oz

## 2019-09-07 MED ORDER — FUROSEMIDE 10 MG/ML IJ SOLN
40.0000 mg | Freq: Once | INTRAMUSCULAR | Status: AC
  Administered 2019-09-07: 40 mg via INTRAVENOUS
  Filled 2019-09-07: qty 4

## 2019-09-07 MED ORDER — SOTALOL HCL 80 MG PO TABS
80.0000 mg | ORAL_TABLET | Freq: Two times a day (BID) | ORAL | Status: DC
  Administered 2019-09-07 – 2019-09-23 (×31): 80 mg via ORAL
  Filled 2019-09-07 (×32): qty 1

## 2019-09-07 MED ORDER — SOTALOL HCL 80 MG PO TABS
40.0000 mg | ORAL_TABLET | ORAL | Status: AC
  Administered 2019-09-07: 40 mg via ORAL
  Filled 2019-09-07: qty 0.5

## 2019-09-07 NOTE — Progress Notes (Signed)
  Echocardiogram 2D Echocardiogram has been performed.  [name] 09/07/2019, 12:27 PM

## 2019-09-07 NOTE — Plan of Care (Signed)
  Problem: Education: Goal: Knowledge of General Education information will improve Description: Including pain rating scale, medication(s)/side effects and non-pharmacologic comfort measures Outcome: Progressing   Problem: Clinical Measurements: Goal: Cardiovascular complication will be avoided Outcome: Progressing   Problem: Nutrition: Goal: Adequate nutrition will be maintained Outcome: Not Progressing   Problem: Coping: Goal: Level of anxiety will decrease Outcome: Progressing   Problem: Pain Managment: Goal: General experience of comfort will improve Outcome: Progressing   Problem: Safety: Goal: Ability to remain free from injury will improve Outcome: Progressing   Problem: Skin Integrity: Goal: Risk for impaired skin integrity will decrease Outcome: Progressing

## 2019-09-07 NOTE — Progress Notes (Signed)
1608: RN secure chat MD Pahwani to clarify LR orders. EP physician states that patient is 4.3L overloaded. Patient NPO with LR running at 67m/hr. MD Pahwani states to continue with LR since patient is NPO for several days.    1715: Central Tele informed RN that patient had runs of V Tach. RN assessed patient and patient stable with no complaints of discomfort at this time.   RN will continue to monitor.

## 2019-09-07 NOTE — Progress Notes (Signed)
Called Wife [name] and informed patient was transferred to room. 3E27.

## 2019-09-07 NOTE — Discharge Instructions (Addendum)
Information on my medicine - ELIQUIS (apixaban)  This medication education was reviewed with me or my healthcare representative as part of my discharge preparation.    Why was Eliquis prescribed for you? Eliquis was prescribed for you to reduce the risk of a blood clot forming that can cause a stroke if you have a medical condition called atrial fibrillation (a type of irregular heartbeat).  What do You need to know about Eliquis ? Take your Eliquis TWICE DAILY - one tablet in the morning and one tablet in the evening with or without food. If you have difficulty swallowing the tablet whole please discuss with your pharmacist how to take the medication safely.  Take Eliquis exactly as prescribed by your doctor and DO NOT stop taking Eliquis without talking to the doctor who prescribed the medication.  Stopping may increase your risk of developing a stroke.  Refill your prescription before you run out.  After discharge, you should have regular check-up appointments with your healthcare provider that is prescribing your Eliquis.  In the future your dose may need to be changed if your kidney function or weight changes by a significant amount or as you get older.  What do you do if you miss a dose? If you miss a dose, take it as soon as you remember on the same day and resume taking twice daily.  Do not take more than one dose of ELIQUIS at the same time to make up a missed dose.  Important Safety Information A possible side effect of Eliquis is bleeding. You should call your healthcare provider right away if you experience any of the following: ? Bleeding from an injury or your nose that does not stop. ? Unusual colored urine (red or dark brown) or unusual colored stools (red or black). ? Unusual bruising for unknown reasons. ? A serious fall or if you hit your head (even if there is no bleeding).  Some medicines may interact with Eliquis and might increase your risk of bleeding or  clotting while on Eliquis. To help avoid this, consult your healthcare provider or pharmacist prior to using any new prescription or non-prescription medications, including herbals, vitamins, non-steroidal anti-inflammatory drugs (NSAIDs) and supplements.  This website has more information on Eliquis (apixaban): http://www.eliquis.com/eliquis/home    Diverticulitis  Diverticulitis is when small pockets in your large intestine (colon) get infected or swollen. This causes stomach pain and watery poop (diarrhea). These pouches are called diverticula. They form in people who have a condition called diverticulosis. Follow these instructions at home: Medicines  Take over-the-counter and prescription medicines only as told by your doctor. These include: ? Antibiotics. ? Pain medicines. ? Fiber pills. ? Probiotics. ? Stool softeners.  Do not drive or use heavy machinery while taking prescription pain medicine.  If you were prescribed an antibiotic, take it as told. Do not stop taking it even if you feel better. General instructions   Follow a diet as told by your doctor.  When you feel better, your doctor may tell you to change your diet. You may need to eat a lot of fiber. Fiber makes it easier to poop (have bowel movements). Healthy foods with fiber include: ? Berries. ? Beans. ? Lentils. ? Green vegetables.  Exercise 3 or more times a week. Aim for 30 minutes each time. Exercise enough to sweat and make your heart beat faster.  Keep all follow-up visits as told. This is important. You may need to have an exam of the large  intestine. This is called a colonoscopy. Contact a doctor if:  Your pain does not get better.  You have a hard time eating or drinking.  You are not pooping like normal. Get help right away if:  Your pain gets worse.  Your problems do not get better.  Your problems get worse very fast.  You have a fever.  You throw up (vomit) more than one  time.  You have poop that is: ? Bloody. ? Black. ? Tarry. Summary  Diverticulitis is when small pockets in your large intestine (colon) get infected or swollen.  Take medicines only as told by your doctor.  Follow a diet as told by your doctor. This information is not intended to replace advice given to you by your health care provider. Make sure you discuss any questions you have with your health care provider. Document Revised: 03/08/2017 Document Reviewed: 04/12/2016 Elsevier Patient Education  [place].

## 2019-09-07 NOTE — Consult Note (Signed)
Cardiology Consultation:   Patient ID: [name] MRN: [id number]; DOB: 1933-03-14  Admit date: 09/01/2019 Date of Consult: 09/07/2019  Primary Care Provider: [name], MD Primary Cardiologist: [name], MD  Primary Electrophysiologist:  Constance Haw, MD    Patient Profile:   [name] is a 86 y.o. male with a hx of atrial fibrillation coronary artery disease status post CABG x3 with AVR, CKD stage IV, chronic systolic and diastolic heart failure with an ejection fraction of 25 to 30% who is being seen today for the evaluation of VT at the request of Kirk Ruths.  History of Present Illness:   [name] is currently on mexiletine for ventricular tachycardia. He has a Medtronic CRT-D. He was admitted 09/02/2019 with severe diverticulitis and elevated lactic acid. He was placed on IV fluids and he improved. He was worsened when he started a liquid diet and was found to have an ileus. An NG tube was placed. He was noted to have nonsustained VT on the monitor and thus cardiology was consulted.  He continues to feel poorly with weakness and fatigue. He has not eaten or had a bowel movement since Tuesday.    Past Medical History:  Diagnosis Date  . Arthritis    "minor everywhere" (04/22/2015)  . Asthma    "a touch"  . Barrett esophagus   . CAD (coronary artery disease)    a. s/p 3 vvessel CABG with LIMA to LAD, SVG to diagonal 1, SVG to RCA 12/09.  . Cardiac resynchronization therapy defibrillator (CRT-D) in place   . Chronic anticoagulation 01/23/2013  . Chronic combined systolic and diastolic CHF (congestive heart failure) (HCC)    dry weight 197-200lbs.  . CKD (chronic kidney disease) stage 3, GFR 30-59 ml/min 08/23/2014  . DCM (dilated cardiomyopathy) ([place])    initially concerned for TTP amyloidosis but Tc-PYP scan 06/2017 was not consistent with amyloid and SPEP/UPEP with no M spike. EF 20-25% by echo 2019  . Diverticulitis 08/2019  . Dyslipidemia  01/23/2013  . Essential hypertension 01/23/2013  . GERD (gastroesophageal reflux disease)   . GI bleed 05/08/2017  . H/O: rheumatic fever   . Hepatitis 1957   "don't know what kind:  . History of hiatal hernia   . History of PFTs    a. Amiodarone started 5/16 >> PFTs w/ DLCO 6/16:  FEV1 72% predicted, FEV1/FVC 66%, DLCO 66% >> minimal reversible obstructive airways disease with mild diffusion defect (suggestive of emphysema but absence of hyperinflation inconsistent with dx)  . Hyperkalemia 08/23/2014  . Hyperthyroidism 05/26/2017  . Leg weakness 01/28/2017  . Multiple thyroid nodules 08/13/2017  . Orthostatic hypotension   . Persistent atrial fibrillation ([place]) 01/23/2013  . Pleural effusion on right 05/26/2017  . Pneumonia 08/2014   "dr thought I may have had a touch"  . Pre-diabetes   . Protein-calorie malnutrition, severe 05/27/2017  . S/P AVR (aortic valve replacement) 2009   a. severe AS s/p AVR with pericardial tissue valve 2009.  Marland Kitchen Shingles 08/23/2014  . VT (ventricular tachycardia) ([place]) 05/06/2017    Past Surgical History:  Procedure Laterality Date  . AORTIC VALVE REPLACEMENT  with 23-mm Magna Ease pericardial valve, model number   with 23-mm Magna Ease pericardial valve, model [id number], serial number [id number]   . APPENDECTOMY  1940s  . BI-VENTRICULAR IMPLANTABLE CARDIOVERTER DEFIBRILLATOR  (CRT-D)  04/22/2015  . CARDIAC CATHETERIZATION N/A 11/04/2014   Procedure: Left Heart Cath and Cors/Grafts Angiography;  Surgeon: Leonie Man, MD;  Location: [place] [facility];  Service: Cardiovascular;  Laterality: N/A;  . CARDIAC VALVE REPLACEMENT    . CARDIOVERSION N/A 01/27/2013   Procedure: CARDIOVERSION;  Surgeon: Sueanne Margarita, MD;  Location: [facility];  Service: Cardiovascular;  Laterality: N/A;  . CARDIOVERSION N/A 08/25/2014   Procedure: CARDIOVERSION;  Surgeon: Sanda Klein, MD;  Location: [place];  Service: Cardiovascular;  Laterality: N/A;  . CARDIOVERSION N/A  05/28/2018   Procedure: CARDIOVERSION;  Surgeon: Pixie Casino, MD;  Location: [facility];  Service: Cardiovascular;  Laterality: N/A;  . CATARACT EXTRACTION W/ INTRAOCULAR LENS  IMPLANT, BILATERAL Bilateral   . CHEST TUBE INSERTION Right 07/26/2017   Procedure: INSERTION PLEURAL DRAINAGE CATHETER - RIGHT;  Surgeon: Ivin Poot, MD;  Location: [place];  Service: Thoracic;  Laterality: Right;  . CORONARY ARTERY BYPASS GRAFT  03/10/2008   x 3 Dr. Roxan Hockey  . EP IMPLANTABLE DEVICE N/A 04/22/2015   Procedure: BiV ICD Insertion CRT-D;  Surgeon: Kennon Encinas Meredith Leeds, MD;  Location: [place] [facility];  Service: Cardiovascular;  Laterality: N/A;  . ESOPHAGOGASTRODUODENOSCOPY (EGD) WITH ESOPHAGEAL DILATION  X1  . ESOPHAGOGASTRODUODENOSCOPY (EGD) WITH PROPOFOL N/A 05/09/2017   Procedure: ESOPHAGOGASTRODUODENOSCOPY (EGD) WITH PROPOFOL;  Surgeon: Carol Ada, MD;  Location: [place];  Service: Endoscopy;  Laterality: N/A;  . IR THORACENTESIS ASP PLEURAL SPACE W/IMG GUIDE  05/27/2017  . IR THORACENTESIS ASP PLEURAL SPACE W/IMG GUIDE  06/17/2017  . IR THORACENTESIS ASP PLEURAL SPACE W/IMG GUIDE  06/18/2017  . IR THORACENTESIS ASP PLEURAL SPACE W/IMG GUIDE  07/11/2017  . REMOVAL OF PLEURAL DRAINAGE CATHETER Right 09/13/2017   Procedure: REMOVAL OF PLEURAL DRAINAGE CATHETER;  Surgeon: Ivin Poot, MD;  Location: [place];  Service: Thoracic;  Laterality: Right;  . RIGHT HEART CATH AND CORONARY/GRAFT ANGIOGRAPHY N/A 05/31/2017   Procedure: RIGHT HEART CATH AND CORONARY/GRAFT ANGIOGRAPHY;  Surgeon: Jolaine Artist, MD;  Location: [place] [facility];  Service: Cardiovascular;  Laterality: N/A;  . TONSILLECTOMY       Home Medications:  Prior to Admission medications   Medication Sig Start Date End Date Taking? Authorizing Provider  acetaminophen (TYLENOL) 325 MG tablet Take 325-650 mg by mouth every 6 (six) hours as needed for mild pain.    Yes [provider]  albuterol (PROVENTIL  HFA;VENTOLIN HFA) 108 (90 Base) MCG/ACT inhaler Inhale 1-2 puffs into the lungs every 6 (six) hours as needed for wheezing.    Yes [provider]  atorvastatin (LIPITOR) 20 MG tablet TAKE 1 TABLET EVERY DAY  AT  6  PM Patient taking differently: Take 20 mg by mouth every evening.  06/24/19  Yes Elijan Googe Hassell Done, MD  carvedilol (COREG) 25 MG tablet TAKE 1 TABLET TWICE DAILY (DOSE INCREASE) Patient taking differently: Take 25 mg by mouth 2 (two) times daily with a meal.  04/14/19  Yes Alajia Schmelzer Hassell Done, MD  ELIQUIS 2.5 MG TABS tablet TAKE 1 TABLET TWICE DAILY Patient taking differently: Take 2.5 mg by mouth 2 (two) times daily.  06/23/19  Yes Bensimhon, Shaune Pascal, MD  famotidine (PEPCID) 40 MG tablet Take 40 mg by mouth every evening.  07/01/18  Yes [provider]  furosemide (LASIX) 40 MG tablet TAKE 1 TABLET TWICE DAILY Patient taking differently: Take 40 mg by mouth daily.  08/27/18  Yes Turner, Eber Hong, MD  hydrALAZINE (APRESOLINE) 25 MG tablet TAKE 1/2 TABLET TWICE DAILY BEFORE MEALS. PLEASE KEEP UPCOMING APPT IN JANUARY WITH DR. Radford Pax FOR FUTURE REFILLS. [place] YOU Patient  taking differently: Take 12.5 mg by mouth 2 (two) times daily before a meal.  06/24/19  Yes Turner, Eber Hong, MD  isosorbide mononitrate (IMDUR) 30 MG 24 hr tablet TAKE 1 TABLET EVERY DAY Patient taking differently: Take 30 mg by mouth every evening.  06/24/19  Yes Sanaya Gwilliam, Ocie Doyne, MD  mexiletine (MEXITIL) 150 MG capsule Take 2 capsules (300 mg total) by mouth 3 (three) times daily. Patient taking differently: Take 300 mg by mouth 2 (two) times daily.  07/01/19  Yes Kaileb Monsanto Hassell Done, MD  Multiple Vitamin (MULTIVITAMIN WITH MINERALS) TABS tablet Take 1 tablet by mouth daily.    Yes [provider]  Omega-3 Fatty Acids (FISH OIL) 1000 MG CAPS Take 2,000 mg by mouth 2 (two) times daily.   Yes [provider]  pantoprazole (PROTONIX) 40 MG tablet Take 40 mg by mouth daily. 07/11/19  Yes  [provider]  sotalol (BETAPACE) 80 MG tablet Take 0.5 tablets (40 mg total) by mouth 2 (two) times daily. 08/06/19  Yes Telesforo Brosnahan Hassell Done, MD  traMADol (ULTRAM) 50 MG tablet Take 1 tablet (50 mg total) by mouth every 12 (twelve) hours as needed for moderate pain or severe pain. 05/10/17  Yes Rai, Ripudeep K, MD  ciprofloxacin (CIPRO) 500 MG tablet Take 500 mg by mouth 2 (two) times daily. 09/01/19   [provider]    Inpatient Medications: Scheduled Meds: . apixaban  2.5 mg Oral BID  . atorvastatin  20 mg Oral QPM  . carvedilol  25 mg Oral BID WC  . docusate sodium  100 mg Oral BID  . famotidine  40 mg Oral QPM  . hydrALAZINE  12.5 mg Oral BID AC  . isosorbide mononitrate  30 mg Oral QPM  . mexiletine  300 mg Oral BID  . pantoprazole  40 mg Oral Daily  . sodium chloride flush  3 mL Intravenous Q12H  . sotalol  40 mg Oral BID   Continuous Infusions: . ceFEPime (MAXIPIME) IV 2 g (09/06/19 2214)  . lactated ringers 75 mL/hr at 09/06/19 1516  . metronidazole 500 mg (09/07/19 0920)   PRN Meds: acetaminophen **OR** acetaminophen, albuterol, bisacodyl, diphenhydrAMINE, hydrALAZINE, HYDROcodone-acetaminophen, morphine injection, ondansetron **OR** ondansetron (ZOFRAN) IV, phenol, polyethylene glycol, zolpidem  Allergies:    Allergies  Allergen Reactions  . Novocain [Procaine] Swelling    SWELLING REACTION UNSPECIFIED   . Amiodarone Nausea And Vomiting    Social History:   Social History   Socioeconomic History  . Marital status: Married    Spouse name: Not on file  . Number of children: Not on file  . Years of education: Not on file  . Highest education level: Not on file  Occupational History  . Not on file  Tobacco Use  . Smoking status: Never Smoker  . Smokeless tobacco: Never Used  Substance and Sexual Activity  . Alcohol use: Not Currently    Comment: 04/22/2015 "nothing since the 1980s; never had a problem w/it"  . Drug use: No  . Sexual  activity: Not Currently  Other Topics Concern  . Not on file  Social History Narrative   Married, lives with spouse [name]   4 children, 2 boys and 2 girls > one daughter passed   OCCUPATION: retired, Pension scheme manager for [employer] Strain:   . Difficulty of Paying Living Expenses:   Food Insecurity:   . Worried About Charity fundraiser in the Last  Year:   . Ran Out of Food in the Last Year:   Transportation Needs:   . Film/video editor (Medical):   Marland Kitchen Lack of Transportation (Non-Medical):   Physical Activity:   . Days of Exercise per Week:   . Minutes of Exercise per Session:   Stress:   . Feeling of Stress :   Social Connections:   . Frequency of Communication with Friends and Family:   . Frequency of Social Gatherings with Friends and Family:   . Attends Religious Services:   . Active Member of Clubs or Organizations:   . Attends Archivist Meetings:   Marland Kitchen Marital Status:   Intimate Partner Violence:   . Fear of Current or Ex-Partner:   . Emotionally Abused:   Marland Kitchen Physically Abused:   . Sexually Abused:     Family History:    Family History  Problem Relation Age of Onset  . Alzheimer's disease Mother   . COPD Daughter   . Alzheimer's disease Other   . COPD Sister   . Depression Sister   . Heart disease Sister   . Hyperlipidemia Sister   . Hypertension Sister   . Esophageal cancer Sister        + smoker     ROS:  Please see the history of present illness.   All other ROS reviewed and negative.     Physical Exam/Data:   Vitals:   09/06/19 2138 09/07/19 0327 09/07/19 0517 09/07/19 0903  BP: 138/65  132/73 (!) 154/72  Pulse: 72  78 76  Resp: _0 Temp: 98.4 F (36.9 C)  97.9 F (36.6 C) 98.1 F (36.7 C)  TempSrc: Oral  Oral Oral  SpO2: 95%  95% 96%  Weight: 98.9 kg 98.9 kg    Height:        Intake/Output Summary (Last 24 hours) at 09/07/2019 0931 Last data filed at  09/07/2019 0800 Gross per 24 hour  Intake 1144.88 ml  Output 2020 ml  Net -875.12 ml   Last 3 Weights 09/07/2019 09/06/2019 09/06/2019  Weight (lbs) 218 lb 0.6 oz 218 lb 0.6 oz 218 lb 4.1 oz  Weight (kg) 98.9 kg 98.9 kg 99 kg     Body mass index is 29.57 kg/m.  General:  Well nourished, well developed, in no acute distress HEENT: normal Lymph: no adenopathy Neck: no JVD Endocrine:  No thryomegaly Vascular: No carotid bruits; FA pulses 2+ bilaterally without bruits  Cardiac:  normal S1, S2; RRR; no murmur  Lungs:  clear to auscultation bilaterally, no wheezing, rhonchi or rales  Abd: soft, nontender, no hepatomegaly  Ext: no edema Musculoskeletal:  No deformities, BUE and BLE strength normal and equal Skin: warm and dry  Neuro:  CNs 2-12 intact, no focal abnormalities noted Psych:  Normal affect   EKG:  The EKG was personally reviewed and demonstrates: Sinus rhythm, ventricular paced Telemetry:  Telemetry was personally reviewed and demonstrates: Ventricular tachycardia  Relevant CV Studies: TTE 2019 - Left ventricle: The cavity size was normal. Wall thickness was  increased in a pattern of mild LVH. Systolic function was  severely reduced. The estimated ejection fraction was in the  range of 20% to 25%. There is akinesis of the inferolateral,  inferior, and inferoseptal myocardium. Features are consistent  with a pseudonormal left ventricular filling pattern, with  concomitant abnormal relaxation and increased filling pressure  (grade 2 diastolic dysfunction).  - Aortic valve: A pericardial tissue valve bioprosthesis was  present and functioning normally. Peak velocity (S): 226 cm/s.  Mean gradient (S): 10 mm Hg. Valve area (VTI): 1.52 cm^2. Valve  area (Vmax): 1.5 cm^2. Valve area (Vmean): 1.59 cm^2.  - Mitral valve: Calcified annulus. Mildly thickened leaflets .  There was mild regurgitation.  - Left atrium: The atrium was severely dilated.  - Right  ventricle: Pacer wire or catheter noted in right ventricle.  - Pulmonary arteries: Systolic pressure was mildly increased. PA  peak pressure: 35 mm Hg (S).   Laboratory Data:  High Sensitivity Troponin:  No results for input(s): TROPONINIHS in the last 720 hours.   Chemistry Recent Labs  Lab 09/05/19 0104 09/06/19 0545 09/07/19 0328  NA 132* 133* 135  K 4.3 3.7 4.1  CL 104 104 105  CO2 18* 22 20*  GLUCOSE 133* 147* 126*  BUN 31* 31* 27*  CREATININE 1.96* 1.79* 1.67*  CALCIUM 8.0* 7.7* 7.8*  GFRNONAA 30* 34* 37*  GFRAA 35* 39* 43*  ANIONGAP _0 Recent Labs  Lab 09/05/19 0104 09/06/19 0545 09/07/19 0328  PROT 5.0* 4.7* 5.0*  ALBUMIN 2.0* 1.8* 1.9*  AST _1 ALT _2 ALKPHOS 36* 38 39  BILITOT 0.5 0.7 0.9   Hematology Recent Labs  Lab 09/05/19 0827 09/06/19 0545 09/07/19 0328  WBC 12.8* 9.4 12.9*  RBC 3.94* 3.86* 4.16*  HGB 11.8* 11.6* 12.4*  HCT 35.8* 35.3* 37.9*  MCV 90.9 91.5 91.1  MCH 29.9 30.1 29.8  MCHC 33.0 32.9 32.7  RDW 14.9 15.0 14.7  PLT 251 264 311   BNPNo results for input(s): BNP, PROBNP in the last 168 hours.  DDimer No results for input(s): DDIMER in the last 168 hours.   Radiology/Studies:  DG Abd 1 View  Result Date: 09/07/2019 CLINICAL DATA:  Ileus EXAM: ABDOMEN - 1 VIEW COMPARISON:  Sep 05, 2019 FINDINGS: Nasogastric tube tip is at the gastroesophageal junction. There remain loops of dilated small bowel with contrast seen in the colon. No free air evident. Lung bases clear. IMPRESSION: Nasogastric tube tip at gastroesophageal junction. Advise advancing nasogastric tube 5-6 cm. Persistent small bowel dilatation, likely due to ileus. A degree of partial bowel obstruction cannot be excluded. Note that there is air in the rectum, and contrast is seen in the colon. No appreciable free air. These results Corban Kistler be called to the ordering clinician or representative by the Radiologist Assistant, and communication documented in the  PACS or [employer]. Electronically Signed   By: Lowella Grip III M.D.   On: 09/07/2019 08:50   DG Abd 1 View  Result Date: 09/05/2019 CLINICAL DATA:  Follow-up abdominal distension and pain. EXAM: ABDOMEN - 1 VIEW COMPARISON:  09/05/2019 radiograph and prior studies FINDINGS: An NG tube is again identified with tip overlying the proximal-mid stomach. Dilated small bowel loops appear slightly increased in caliber from 09/04/2019. Oral contrast within the proximal colon is present. No other significant change noted. IMPRESSION: Slightly increased small bowel dilatation from 09/04/2019 which may represent a slightly worsening ileus. Electronically Signed   By: Margarette Canada M.D.   On: 09/05/2019 13:26   DG Abd 1 View  Result Date: 09/05/2019 CLINICAL DATA:  nasogastric (NG) tube placement EXAM: ABDOMEN - 1 VIEW COMPARISON:  Abdominal radiograph 09/04/2019 FINDINGS: Interval placement of a nasogastric tube with side port likely just beyond the GE junction. Persistent diffuse gaseous distention of the visualized bowel loops. IMPRESSION: Interval placement of nasogastric tube with side port  likely just beyond the GE junction. Advancement of approximately 3 cm could be considered. Electronically Signed   By: Audie Pinto M.D.   On: 09/05/2019 12:10   DG Abd 1 View  Result Date: 09/04/2019 CLINICAL DATA:  Abdominal bloating. EXAM: ABDOMEN - 1 VIEW COMPARISON:  March 12, 2008. FINDINGS: Air-filled large and small bowel loops are noted suggesting possible ileus. No radio-opaque calculi or other significant radiographic abnormality are seen. IMPRESSION: Air-filled large and small bowel loops are noted suggesting possible ileus. Electronically Signed   By: Marijo Conception M.D.   On: 09/04/2019 11:25      Assessment and Plan:   1. Ventricular tachycardia: Currently on mexiletine and sotalol. He is having recurrent episodes due to an acute illness likely. Has not tolerated amiodarone in the past  unfortunately. We'll continue his mexiletine and increase his sotalol to 80 mg today. We'll continue to check ECGs with each dose and potentially increase as tolerated. He is also 4.3L volume overloaded and Tedd Cottrill thus restart his lasix as ventricular stretch could be contributing to his VT.  By the time I got into his room the the Medtronic programmer he was in sinus rhythm with PVCs. Device interrogation was set to therapy at 160. Leads were stable. Arnetra Terris have Medtronic add a monitor zone tomorrow. 2. Acute systolic heart failure: He has unfortunately 4.3 L volume overloaded. This could certainly be part of the cause of his ventricular tachycardia. Blood pressure Dink Creps currently tolerate diuresis. We'll start with 40 mg IV Lasix. He does have CKD, and his creatinine Tanee Henery need to be monitored during diuresis. Fortunately his creatinine has improved throughout his hospital stay. 3. 3. Coronary artery disease: Status post CABG in 2009. Continue Imdur and statin. 4. CKD stage IV: Creatinine has improved from his baseline. We'll plan for diuresis.      For questions or updates, please contact [place] Please consult [email or web address] for contact info under     Signed, Kelin Nixon Meredith Leeds, MD  09/07/2019 9:31 AM

## 2019-09-07 NOTE — Significant Event (Signed)
Cardiology Moon[place] Note  Paged by care nurse. Evening ECG showing QTc 539m. Patient due for 857msotalol at 10pm (has not received this dose yet).   ECG reviewed. QTc appears similar to QTC from ECG taken earlier this morning. Renal function stable/improving. Ok to give PM dose of sotalol. Per review of patient's chart, his antiarrhythmic options are fairly limited. Will make sure to obtain follow up ECG tonight 3 hours after evening dose of sotalol. QTc will be reviewed by EP team in AM prior to AM sotalol dose. Will copy Dr CaCurt Bearsn this note.   AnMarcie MowersMD Cardiology Fellow, PGY-7

## 2019-09-07 NOTE — Progress Notes (Signed)
PROGRESS NOTE    [name]  VPX:[id number] DOB: 04-Jun-1932 DOA: 09/01/2019 PCP: [name], MD   Brief Narrative:  [name] is an 86 y.o. male with a past medical history significant for A. fib, TAVR, prediabetes, chronic kidney disease stage IV, chronic combined CHF, AICD placement, hypertension, CAD status post CABG admitted May 26 with severe diverticulitis.  Provided with gentle IV fluids and IV antibiotics.  He initially improved so he was started on clears but then his pain got worse.  Abdominal x-ray was obtained which showed ileus.  He was kept n.p.o.  Antibiotics initially were cefepime and then Flagyl were added.  Patient was having frequent NSVT's for which cardiology was consulted on 09/06/2019.  Assessment & Plan:   Principal Problem:   Diverticulitis Active Problems:   Essential hypertension   Dyslipidemia   Persistent atrial fibrillation (HCC)   Stage 4 chronic kidney disease (HCC)   Chronic combined systolic (congestive) and diastolic (congestive) heart failure (HCC)   Cardiac resynchronization therapy defibrillator (CRT-D) in place   Acute urinary retention   Hyponatremia   Metabolic acidosis   Ileus (HCC)  Acute severe diverticulitis/ileus: He continues to feel better.  Yesterday.  Pain is only 2 out of 10.  No nausea.he claims passing flatus.  Lactic acid negative once again today.  Back with leukocytosis but afebrile.  Hemodynamically stable.  Continue NG tube to LIS for another day.  Continue IV fluids and pain management as well as  Continue current antibiotics, cefepime and Flagyl.  Abdomen feels softer and less distended. Repeat x-ray shows persistent small bowel obstruction with no change from yesterday.  Chronic hyponatremia and metabolic acidosis: Sodium improved and back to normal.  Slight drop in CO2.  Continue Ringer's lactate for now.  Reassess tomorrow.    Urinary retention: Resolved.  Chronic combined systolic and diastolic heart  failure: Appears euvolemic.  Continue carvedilol.  Lasix resumed by cardiology.  CKD stage IV: At baseline.  Watch daily.  Chronic atrial fibrillation: Controlled.  Continue Eliquis and carvedilol.  Cardiac resynchronization therapy defibrillator in place/frequent NSVT.  Patient has been having intermittent nonsustained V. tach's which were getting more frequent however he remained asymptomatic.  Cardiology was consulted on 09/06/2019.  They increased his sotalol to 80 mg twice daily and resume his Lasix.  Appreciate cardiology help.  Monitor on telemetry.    Essential hypertension: Very well controlled.  Continue current medications.  Hyperlipidemia: Continue statin.  DVT prophylaxis: Eliquis   Code Status: DNR  Family Communication: Daughter at bedside.  Plan of care discussed with her and the patient in length.  Several questions answered. Patient is from: Home Disposition Plan: Home Barriers to discharge: Persistent severe diverticulitis and ileus  Status is: Inpatient  Remains inpatient appropriate because:IV treatments appropriate due to intensity of illness or inability to take PO   Dispo: The patient is from: Home              Anticipated d/c is to: Home              Anticipated d/c date is: 3 days              Patient currently is not medically stable to d/c.         Estimated body mass index is 29.57 kg/m as calculated from the following:   Height as of this encounter: 6' (1.829 m).   Weight as of this encounter: 98.9 kg.      Nutritional status:  Consultants:   Cardiology  Procedures:   None  Antimicrobials:  Anti-infectives (From admission, onward)   Start     Dose/Rate Route Frequency Ordered Stop   09/05/19 2200  ceFEPIme (MAXIPIME) 2 g in sodium chloride 0.9 % 100 mL IVPB     2 g 200 mL/hr over 30 Minutes Intravenous Every 12 hours 09/05/19 0847     09/03/19 0800  ceFEPIme (MAXIPIME) 2 g in sodium chloride 0.9 % 100 mL  IVPB  Status:  Discontinued     2 g 200 mL/hr over 30 Minutes Intravenous Every 24 hours 09/02/19 0725 09/05/19 0847   09/02/19 1500  metroNIDAZOLE (FLAGYL) IVPB 500 mg     500 mg 100 mL/hr over 60 Minutes Intravenous Every 8 hours 09/02/19 1121     09/02/19 0730  ceFEPIme (MAXIPIME) 2 g in sodium chloride 0.9 % 100 mL IVPB     2 g 200 mL/hr over 30 Minutes Intravenous  Once 09/02/19 0721 09/02/19 0900   09/02/19 0730  metroNIDAZOLE (FLAGYL) IVPB 500 mg     500 mg 100 mL/hr over 60 Minutes Intravenous  Once 09/02/19 0721 09/02/19 1056         Subjective: Patient seen and examined.  Daughter at the bedside.  He continues to feel well.  Pain is only 2 out of 10.  No nausea.  Passing gas.  Objective: Vitals:   09/06/19 2138 09/07/19 0327 09/07/19 0517 09/07/19 0903  BP: 138/65  132/73 (!) 154/72  Pulse: 72  78 76  Resp: _0 Temp: 98.4 F (36.9 C)  97.9 F (36.6 C) 98.1 F (36.7 C)  TempSrc: Oral  Oral Oral  SpO2: 95%  95% 96%  Weight: 98.9 kg 98.9 kg    Height:        Intake/Output Summary (Last 24 hours) at 09/07/2019 1102 Last data filed at 09/07/2019 0900 Gross per 24 hour  Intake 1144.88 ml  Output 1900 ml  Net -755.12 ml   Filed Weights   09/06/19 0500 09/06/19 2138 09/07/19 0327  Weight: 99 kg 98.9 kg 98.9 kg    Examination:  General exam: Appears calm and comfortable  Respiratory system: Clear to auscultation. Respiratory effort normal. Cardiovascular system: S1 & S2 heard, RRR. No JVD, murmurs, rubs, gallops or clicks. No pedal edema. Gastrointestinal system: Abdomen is minimally distended, soft and tender at right lower quadrant and suprapubic area. No organomegaly or masses felt.  Slightly diminished bowel sounds heard. Central nervous system: Alert and oriented. No focal neurological deficits. Extremities: Symmetric 5 x 5 power. Skin: No rashes, lesions or ulcers.  Psychiatry: Judgement and insight appear poor. Mood & affect flat  Data Reviewed:  I have personally reviewed following labs and imaging studies  CBC: Recent Labs  Lab 09/02/19 0445 09/02/19 0445 09/03/19 0344 09/04/19 0411 09/05/19 0827 09/06/19 0545 09/07/19 0328  WBC 20.0*   < > 13.2* 10.6* 12.8* 9.4 12.9*  NEUTROABS 16.2*  --   --   --  9.8* 6.1 8.5*  HGB 13.0   < > 11.0* 10.9* 11.8* 11.6* 12.4*  HCT 39.9   < > 35.0* 33.6* 35.8* 35.3* 37.9*  MCV 95.2   < > 96.2 92.8 90.9 91.5 91.1  PLT 227   < > 181 196 251 264 311   < > = values in this interval not displayed.   Basic Metabolic Panel: Recent Labs  Lab 09/03/19 0344 09/04/19 0411 09/05/19 0104 09/06/19 0545 09/07/19 0328  NA 131* 130*  132* 133* 135  K 4.6 4.4 4.3 3.7 4.1  CL 104 103 104 104 105  CO2 19* 19* 18* 22 20*  GLUCOSE 102* 124* 133* 147* 126*  BUN 36* 37* 31* 31* 27*  CREATININE 2.49* 2.22* 1.96* 1.79* 1.67*  CALCIUM 7.7* 7.8* 8.0* 7.7* 7.8*  MG  --  1.8  --  1.9 2.0   GFR: Estimated Creatinine Clearance: 40.1 mL/min (A) (by C-G formula based on SCr of 1.67 mg/dL (H)). Liver Function Tests: Recent Labs  Lab 09/01/19 1835 09/05/19 0104 09/06/19 0545 09/07/19 0328  AST _0 ALT _1 ALKPHOS 49 36* 38 39  BILITOT 1.2 0.5 0.7 0.9  PROT 6.4* 5.0* 4.7* 5.0*  ALBUMIN 3.2* 2.0* 1.8* 1.9*   Recent Labs  Lab 09/01/19 1835  LIPASE 22   No results for input(s): AMMONIA in the last 168 hours. Coagulation Profile: No results for input(s): INR, PROTIME in the last 168 hours. Cardiac Enzymes: No results for input(s): CKTOTAL, CKMB, CKMBINDEX, TROPONINI in the last 168 hours. BNP (last 3 results) No results for input(s): PROBNP in the last 8760 hours. HbA1C: No results for input(s): HGBA1C in the last 72 hours. CBG: Recent Labs  Lab 09/03/19 0545 09/03/19 0834 09/04/19 2037 09/05/19 0000 09/05/19 0428  GLUCAP 93 85 105* 106* 129*   Lipid Profile: No results for input(s): CHOL, HDL, LDLCALC, TRIG, CHOLHDL, LDLDIRECT in the last 72 hours. Thyroid Function  Tests: No results for input(s): TSH, T4TOTAL, FREET4, T3FREE, THYROIDAB in the last 72 hours. Anemia Panel: No results for input(s): VITAMINB12, FOLATE, FERRITIN, TIBC, IRON, RETICCTPCT in the last 72 hours. Sepsis Labs: Recent Labs  Lab 09/02/19 1154 09/02/19 1154 09/02/19 1652 09/05/19 1143 09/05/19 1502 09/07/19 0824  PROCALCITON 3.38  --   --   --   --   --   LATICACIDVEN 1.9   < > 1.8 1.2 1.0 1.3   < > = values in this interval not displayed.    Recent Results (from the past 240 hour(s))  Blood Culture (routine x 2)     Status: None   Collection Time: 09/02/19  8:10 AM   Specimen: BLOOD RIGHT ARM  Result Value Ref Range Status   Specimen Description BLOOD RIGHT ARM  Final   Special Requests   Final    BOTTLES DRAWN AEROBIC AND ANAEROBIC Blood Culture adequate volume   Culture   Final    NO GROWTH 5 DAYS Performed at [facility], [address], [place], [place] [address]    Report Status 09/07/2019 FINAL  Final  Blood Culture (routine x 2)     Status: Abnormal   Collection Time: 09/02/19  8:10 AM   Specimen: BLOOD  Result Value Ref Range Status   Specimen Description BLOOD RIGHT ANTECUBITAL  Final   Special Requests   Final    BOTTLES DRAWN AEROBIC AND ANAEROBIC Blood Culture results may not be optimal due to an excessive volume of blood received in culture bottles   Culture  Setup Time   Final    GRAM POSITIVE RODS ANAEROBIC BOTTLE ONLY CRITICAL RESULT CALLED TO, READ BACK BY AND VERIFIED WITH: PHARMD Oceano [id number] FCP    Culture (A)  Final    DIPHTHEROIDS(CORYNEBACTERIUM SPECIES) Standardized susceptibility testing for this organism is not available. Performed at [facility], [place] [address], [place], [place] [address]    Report Status 09/05/2019 FINAL  Final  SARS Coronavirus  2 by RT PCR (hospital order, performed in [facility] hospital lab) Nasopharyngeal Nasopharyngeal Swab     Status: None   Collection Time: 09/02/19  8:12 AM    Specimen: Nasopharyngeal Swab  Result Value Ref Range Status   SARS Coronavirus 2 NEGATIVE NEGATIVE Final    Comment: (NOTE) SARS-CoV-2 target nucleic acids are NOT DETECTED. The SARS-CoV-2 RNA is generally detectable in upper and lower respiratory specimens during the acute phase of infection. The lowest concentration of SARS-CoV-2 viral copies this assay can detect is 250 copies / mL. A negative result does not preclude SARS-CoV-2 infection and should not be used as the sole basis for treatment or other patient management decisions.  A negative result may occur with improper specimen collection / handling, submission of specimen other than nasopharyngeal swab, presence of viral mutation(s) within the areas targeted by this assay, and inadequate number of viral copies (<250 copies / mL). A negative result must be combined with clinical observations, patient history, and epidemiological information. Fact Sheet for Patients:   StrictlyIdeas.no Fact Sheet for Healthcare Providers: BankingDealers.co.za This test is not yet approved or cleared  by the [place] FDA and has been authorized for detection and/or diagnosis of SARS-CoV-2 by FDA under an Emergency Use Authorization (EUA).  This EUA will remain in effect (meaning this test can be used) for the duration of the COVID-19 declaration under Section 564(b)(1) of the Act, 21 U.S.C. section 360bbb-3(b)(1), unless the authorization is terminated or revoked sooner. Performed at [facility], [place] [address], [place], [place] [address]   Urine culture     Status: Abnormal   Collection Time: 09/02/19  9:45 AM   Specimen: In/Out Cath Urine  Result Value Ref Range Status   Specimen Description IN/OUT CATH URINE  Final   Special Requests   Final    NONE Performed at [facility], [place] [address] [place], [place] [address]    Culture MULTIPLE SPECIES PRESENT, SUGGEST  RECOLLECTION (A)  Final   Report Status 09/03/2019 FINAL  Final      Radiology Studies: DG Abd 1 View  Result Date: 09/07/2019 CLINICAL DATA:  Ileus EXAM: ABDOMEN - 1 VIEW COMPARISON:  Sep 05, 2019 FINDINGS: Nasogastric tube tip is at the gastroesophageal junction. There remain loops of dilated small bowel with contrast seen in the colon. No free air evident. Lung bases clear. IMPRESSION: Nasogastric tube tip at gastroesophageal junction. Advise advancing nasogastric tube 5-6 cm. Persistent small bowel dilatation, likely due to ileus. A degree of partial bowel obstruction cannot be excluded. Note that there is air in the rectum, and contrast is seen in the colon. No appreciable free air. These results will be called to the ordering clinician or representative by the Radiologist Assistant, and communication documented in the PACS or [employer]. Electronically Signed   By: Lowella Grip III M.D.   On: 09/07/2019 08:50   DG Abd 1 View  Result Date: 09/05/2019 CLINICAL DATA:  Follow-up abdominal distension and pain. EXAM: ABDOMEN - 1 VIEW COMPARISON:  09/05/2019 radiograph and prior studies FINDINGS: An NG tube is again identified with tip overlying the proximal-mid stomach. Dilated small bowel loops appear slightly increased in caliber from 09/04/2019. Oral contrast within the proximal colon is present. No other significant change noted. IMPRESSION: Slightly increased small bowel dilatation from 09/04/2019 which may represent a slightly worsening ileus. Electronically Signed   By: Margarette Canada M.D.   On: 09/05/2019 13:26   DG Abd 1  View  Result Date: 09/05/2019 CLINICAL DATA:  nasogastric (NG) tube placement EXAM: ABDOMEN - 1 VIEW COMPARISON:  Abdominal radiograph 09/04/2019 FINDINGS: Interval placement of a nasogastric tube with side port likely just beyond the GE junction. Persistent diffuse gaseous distention of the visualized bowel loops. IMPRESSION: Interval placement of nasogastric tube  with side port likely just beyond the GE junction. Advancement of approximately 3 cm could be considered. Electronically Signed   By: Audie Pinto M.D.   On: 09/05/2019 12:10    Scheduled Meds: . apixaban  2.5 mg Oral BID  . atorvastatin  20 mg Oral QPM  . carvedilol  25 mg Oral BID WC  . docusate sodium  100 mg Oral BID  . famotidine  40 mg Oral QPM  . furosemide  40 mg Intravenous Once  . hydrALAZINE  12.5 mg Oral BID AC  . isosorbide mononitrate  30 mg Oral QPM  . mexiletine  300 mg Oral BID  . pantoprazole  40 mg Oral Daily  . sodium chloride flush  3 mL Intravenous Q12H  . sotalol  40 mg Oral NOW  . sotalol  80 mg Oral BID   Continuous Infusions: . ceFEPime (MAXIPIME) IV 2 g (09/06/19 2214)  . lactated ringers 75 mL/hr at 09/06/19 1516  . metronidazole 500 mg (09/07/19 0920)     LOS: 5 days   Time spent: 30 minutes   Darliss Cheney, MD [facility]  09/07/2019, 11:02 AM   To contact the attending provider between 7A-7P or the covering provider during after hours 7P-7A, please log into the web site [email or web address].

## 2019-09-08 ENCOUNTER — Inpatient Hospital Stay (HOSPITAL_COMMUNITY): Payer: Medicare HMO

## 2019-09-08 LAB — COMPREHENSIVE METABOLIC PANEL
ALT: 25 U/L (ref 0–44)
AST: 28 U/L (ref 15–41)
Albumin: 1.9 g/dL — ABNORMAL LOW (ref 3.5–5.0)
Alkaline Phosphatase: 36 U/L — ABNORMAL LOW (ref 38–126)
Anion gap: 10 (ref 5–15)
BUN: 31 mg/dL — ABNORMAL HIGH (ref 8–23)
CO2: 23 mmol/L (ref 22–32)
Calcium: 8 mg/dL — ABNORMAL LOW (ref 8.9–10.3)
Chloride: 105 mmol/L (ref 98–111)
Creatinine, Ser: 1.87 mg/dL — ABNORMAL HIGH (ref 0.61–1.24)
GFR calc Af Amer: 37 mL/min — ABNORMAL LOW (ref >60)
GFR calc non Af Amer: 32 mL/min — ABNORMAL LOW (ref >60)
Glucose, Bld: 126 mg/dL — ABNORMAL HIGH (ref 70–99)
Potassium: 4.1 mmol/L (ref 3.5–5.1)
Sodium: 138 mmol/L (ref 135–145)
Total Bilirubin: 0.7 mg/dL (ref 0.3–1.2)
Total Protein: 4.9 g/dL — ABNORMAL LOW (ref 6.5–8.1)

## 2019-09-08 LAB — CBC WITH DIFFERENTIAL/PLATELET
Abs Immature Granulocytes: 0.49 10*3/uL — ABNORMAL HIGH (ref 0.00–0.07)
Basophils Absolute: 0.1 10*3/uL (ref 0.0–0.1)
Basophils Relative: 1 %
Eosinophils Absolute: 0.2 10*3/uL (ref 0.0–0.5)
Eosinophils Relative: 2 %
HCT: 36.9 % — ABNORMAL LOW (ref 39.0–52.0)
Hemoglobin: 11.8 g/dL — ABNORMAL LOW (ref 13.0–17.0)
Immature Granulocytes: 4 %
Lymphocytes Relative: 16 %
Lymphs Abs: 2 10*3/uL (ref 0.7–4.0)
MCH: 29.4 pg (ref 26.0–34.0)
MCHC: 32 g/dL (ref 30.0–36.0)
MCV: 91.8 fL (ref 80.0–100.0)
Monocytes Absolute: 1.9 10*3/uL — ABNORMAL HIGH (ref 0.1–1.0)
Monocytes Relative: 16 %
Neutro Abs: 7.3 10*3/uL (ref 1.7–7.7)
Neutrophils Relative %: 61 %
Platelets: 336 10*3/uL (ref 150–400)
RBC: 4.02 MIL/uL — ABNORMAL LOW (ref 4.22–5.81)
RDW: 15 % (ref 11.5–15.5)
WBC: 12 10*3/uL — ABNORMAL HIGH (ref 4.0–10.5)
nRBC: 0 % (ref 0.0–0.2)

## 2019-09-08 MED ORDER — FUROSEMIDE 10 MG/ML IJ SOLN
40.0000 mg | Freq: Once | INTRAMUSCULAR | Status: AC
  Administered 2019-09-08: 40 mg via INTRAVENOUS
  Filled 2019-09-08: qty 4

## 2019-09-08 MED ORDER — LACTATED RINGERS IV SOLN: INTRAVENOUS | Status: DC

## 2019-09-08 NOTE — Progress Notes (Signed)
Physical Therapy Treatment Patient Details Name: [name] MRN: [id number] DOB: [birth year] Today's Date: 09/08/2019    History of Present Illness 86 yo male presented to ED 09/02/19 with onset of generalized weakness after a flare of his diverticulitis with metabolic acidosis, acute urinary retention.  No perf on CT, severe diverticulitis on non-contrast CT 09/02/19. Pt currently being monitored for recurring Vtach that started 5/29. PMHx: TAVR, a-fib, CKD4, CHF, AICD, HTN, CAD with CABG, diverticulitis.    PT Comments    Pt demonstrates improvement with decreased assistance level with transfers and ability to transfer to recliner chair. Pt remains a high fall risk, is able to sit to stand min(A) with minimal LOB from elevated surface, but unable to sit to stand from non-elevated surface without assistance and LOB. Completed LE therex with patient and pre gait marching activity with min-mod(A) and assistance for lines. Discussed d/c recommendations, pt remains appropriate for d/c to rehab setting at this time; pt in agreement. Feel pt good candidate for intensive CIR level therapies. Will continue to follow acutely.    Follow Up Recommendations  CIR     Equipment Recommendations  Other (comment)(TBD)    Recommendations for Other Services       Precautions / Restrictions Precautions Precautions: Fall Restrictions Weight Bearing Restrictions: No    Mobility  Bed Mobility Overal bed mobility: Independent Bed Mobility: Supine to Sit     Supine to sit: Min guard     General bed mobility comments: Pt min guard for safety and management of lines.  Transfers Overall transfer level: Needs assistance Equipment used: Rolling walker (2 wheeled) Transfers: Sit to/from Stand Sit to Stand: Min assist;From elevated surface         General transfer comment: Pt min(A) sit to stand from elevated surface and mod(A) from non elevated surface for generalized weakness and  LOB.  Ambulation/Gait Ambulation/Gait assistance: Min assist Gait Distance (Feet): 3 Feet Assistive device: Rolling walker (2 wheeled) Gait Pattern/deviations: Step-to pattern;Wide base of support;Trunk flexed;Decreased stride length Gait velocity: reduced   General Gait Details: side steps with stand pivot to chair min(A) for stability and safety with lines.   Stairs             Wheelchair Mobility    Modified Rankin (Stroke Patients Only)       Balance Overall balance assessment: Needs assistance Sitting-balance support: Feet supported;Bilateral upper extremity supported Sitting balance-Leahy Scale: Fair Sitting balance - Comments: Pt reported feeling dizzy at EOB, VSS.   Standing balance support: Bilateral upper extremity supported;During functional activity Standing balance-Leahy Scale: Poor Standing balance comment: Pt able to sit to stand min(A)-mod(A) with assistance for steadying in standing with RW                            Cognition Arousal/Alertness: Awake/alert Behavior During Therapy: WFL for tasks assessed/performed Overall Cognitive Status: Within Functional Limits for tasks assessed                                        Exercises Total Joint Exercises Ankle Circles/Pumps: AROM;10 reps;Both;Seated Long Arc Quad: AROM;Both;Seated;10 reps Marching in Standing: AROM;Standing;Both;10 reps    General Comments General comments (skin integrity, edema, etc.): Pt reported feeling dizzy during session, BP monitored: sitting EOB 167/102, after transfer to recliner 151/77.      Pertinent Vitals/Pain Pain Assessment:  Faces Faces Pain Scale: Hurts a little bit Pain Location: abdomen with movement Pain Descriptors / Indicators: Grimacing;Guarding Pain Intervention(s): Limited activity within patient's tolerance;Monitored during session    Home Living                      Prior Function            PT Goals  (current goals can now be found in the care plan section) Acute Rehab PT Goals Patient Stated Goal: to walk and get home PT Goal Formulation: With patient Time For Goal Achievement: 09/18/19 Potential to Achieve Goals: Good Progress towards PT goals: Progressing toward goals    Frequency    Min 3X/week      PT Plan Discharge plan needs to be updated    Co-evaluation              AM-PAC PT "6 Clicks" Mobility   Outcome Measure  Help needed turning from your back to your side while in a flat bed without using bedrails?: A Little Help needed moving from lying on your back to sitting on the side of a flat bed without using bedrails?: A Little Help needed moving to and from a bed to a chair (including a wheelchair)?: A Little Help needed standing up from a chair using your arms (e.g., wheelchair or bedside chair)?: A Lot Help needed to walk in hospital room?: A Lot Help needed climbing 3-5 steps with a railing? : A Lot 6 Click Score: 15    End of Session Equipment Utilized During Treatment: Gait belt Activity Tolerance: Other (comment)(Pt limited by generalized weakness) Patient left: in chair;with call bell/phone within reach Nurse Communication: Mobility status PT Visit Diagnosis: Unsteadiness on feet (R26.81);Muscle weakness (generalized) (M62.81);Difficulty in walking, not elsewhere classified (R26.2);Ataxic gait (R26.0);Pain     Time: 0034-9179 PT Time Calculation (min) (ACUTE ONLY): 28 min  Charges:  $Therapeutic Exercise: 8-22 mins $Therapeutic Activity: 8-22 mins                     [employer] SPT 09/08/2019    Rolland Porter 09/08/2019, 9:59 AM

## 2019-09-08 NOTE — Progress Notes (Signed)
Inpatient Rehabilitation-Admissions Coordinator   Per PT note, pt still has CIR needs. AC will place consult order in the chart per our protocol to allow for further follow up and assessment.   Raechel Ache, OTR/L  Rehab Admissions Coordinator  [phone] 09/08/2019 12:29 PM

## 2019-09-08 NOTE — Progress Notes (Signed)
NGT clamped, IVF d/c'd per MD order. Will monitor accordingly.

## 2019-09-08 NOTE — Progress Notes (Signed)
RN spoke with MD Pahwani regarding X ray results. RN informed MD that since NG tube has been clamped patient has had no complaints of discomfort or nausea. MD informed RN to keep tube clamped at this time and to restart LR at 66m/hr.   RN will continue to monitor.

## 2019-09-08 NOTE — Progress Notes (Signed)
KUB still shows obstruction and dilatation but pt comfortable with NGT clamped. Will restart fluids and continue with clamped NGT and repeat KUB tomorrow.

## 2019-09-08 NOTE — Care Management Important Message (Signed)
Important Message  Patient Details  Name: [name] MRN: [id number] Date of Birth: May 27, 1932   Medicare Important Message Given:  Yes     [name] 09/08/2019, 12:39 PM

## 2019-09-08 NOTE — Evaluation (Signed)
Occupational Therapy Evaluation Patient Details Name: [name] MRN: [id number] DOB: 07-02-1932 Today's Date: 09/08/2019    History of Present Illness 86 yo male presented to ED 09/02/19 with onset of generalized weakness after a flare of his diverticulitis with metabolic acidosis, acute urinary retention.  No perf on CT, severe diverticulitis on non-contrast CT 09/02/19. Pt currently being monitored for recurring Vtach that started 5/29. PMHx: TAVR, a-fib, CKD4, CHF, AICD, HTN, CAD with CABG, diverticulitis.   Clinical Impression   Pt PTA: Pt living with spouse at home and is currently her caregiver. Pt reports being active inside and out of home. Pt currently performing ADL tasks with modA overall and mobility with modA and use of RW. Pt requiring increased assist due to weakness, decreased activity tolerance and decreased ability to care for self. BP stable; no dizziness reported. O2 on RA >90% O2. HR <120 BPM with exertion. Pt very motivated to return to independence as PLOF and would benefit from CIR therapy at d/c. Pt would benefit from continued OT skilled services. OT following acutely.      Follow Up Recommendations  CIR    Equipment Recommendations  Other (comment)(defer to next facility)    Recommendations for Other Services       Precautions / Restrictions Precautions Precautions: Fall;Other (comment) Precaution Comments: NG tube Restrictions Weight Bearing Restrictions: No      Mobility Bed Mobility               General bed mobility comments: in recliner pre and post session  Transfers Overall transfer level: Needs assistance Equipment used: Rolling walker (2 wheeled) Transfers: Sit to/from Stand Sit to Stand: Mod assist;From elevated surface         General transfer comment: Pt modA from recliner and 2 times with momentous rocking forward    Balance Overall balance assessment: Needs assistance Sitting-balance support: Feet supported;Bilateral  upper extremity supported Sitting balance-Leahy Scale: Fair Sitting balance - Comments: Pt reported feeling dizzy at EOB, VSS.   Standing balance support: Bilateral upper extremity supported;During functional activity Standing balance-Leahy Scale: Poor Standing balance comment: Pt modA from recliner into standing; requiring momentum. Standing x4 mins for activity tolerance with RW                           ADL either performed or assessed with clinical judgement   ADL Overall ADL's : Needs assistance/impaired Eating/Feeding: Modified independent;Sitting   Grooming: Set up;Sitting   Upper Body Bathing: Set up;Sitting   Lower Body Bathing: Moderate assistance;Sitting/lateral leans;Sit to/from stand;Cueing for safety   Upper Body Dressing : Set up;Sitting   Lower Body Dressing: Moderate assistance;Cueing for safety;Sitting/lateral leans;Sit to/from stand Lower Body Dressing Details (indicate cue type and reason): Pt can perform figure 4 technique, but cannot perform ADLs in standing Toilet Transfer: Minimal assistance;Cueing for safety;RW Toilet Transfer Details (indicate cue type and reason): Pt able to take steps forward and backward; limited by NG tube Toileting- Clothing Manipulation and Hygiene: Minimal assistance;Sitting/lateral lean;Sit to/from stand;Cueing for safety       Functional mobility during ADLs: Moderate assistance;Rolling walker;Cueing for safety;Cueing for sequencing General ADL Comments: Pt requiring increased assist due to weakness, decreased activity tolerance and decreased ability to care for self.     Vision Baseline Vision/History: Wears glasses Wears Glasses: At all times Patient Visual Report: No change from baseline Vision Assessment?: No apparent visual deficits     Perception     Praxis  Pertinent Vitals/Pain Pain Assessment: Faces Faces Pain Scale: Hurts a little bit Pain Location: abdomen with movement Pain Descriptors /  Indicators: Discomfort;Guarding Pain Intervention(s): Monitored during session;Repositioned     Hand Dominance Right   Extremity/Trunk Assessment Upper Extremity Assessment Upper Extremity Assessment: Overall WFL for tasks assessed   Lower Extremity Assessment Lower Extremity Assessment: Generalized weakness   Cervical / Trunk Assessment Cervical / Trunk Assessment: Kyphotic   Communication Communication Communication: No difficulties   Cognition Arousal/Alertness: Awake/alert Behavior During Therapy: WFL for tasks assessed/performed Overall Cognitive Status: Within Functional Limits for tasks assessed                                     General Comments  BP stable; no dizziness reported. O2 on RA. HR <120 BPM with exertion.    Exercises     Shoulder Instructions      Home Living Family/patient expects to be discharged to:: Private residence Living Arrangements: Spouse/significant other Available Help at Discharge: Family;Available PRN/intermittently(daughter) Type of Home: House Home Access: Ramped entrance;Stairs to enter Entrance Stairs-Number of Steps: 1 Entrance Stairs-Rails: Right Home Layout: One level     Bathroom Shower/Tub: Teacher, early years/pre: Handicapped height     Home Equipment: Environmental consultant - 2 wheels;Cane - quad;Grab bars - toilet;Grab bars - tub/shower          Prior Functioning/Environment Level of Independence: Independent with assistive device(s)        Comments: Pt reports being very active outdoors        OT Problem List: Decreased strength;Decreased activity tolerance;Impaired balance (sitting and/or standing);Decreased safety awareness;Pain;Decreased knowledge of use of DME or AE      OT Treatment/Interventions: Self-care/ADL training;Therapeutic exercise;Energy conservation;Therapeutic activities;Patient/family education;Balance training    OT Goals(Current goals can be found in the care plan section)  Acute Rehab OT Goals Patient Stated Goal: to walk and get home OT Goal Formulation: With patient Time For Goal Achievement: 09/22/19 Potential to Achieve Goals: Good ADL Goals Pt Will Perform Grooming: with supervision;standing Pt Will Perform Lower Body Dressing: with supervision;sitting/lateral leans;sit to/from stand Pt Will Transfer to Toilet: with min guard assist;ambulating;regular height toilet Pt Will Perform Toileting - Clothing Manipulation and hygiene: with min guard assist;sitting/lateral leans;sit to/from stand Pt/caregiver will Perform Home Exercise Program: Increased strength;Both right and left upper extremity;With Supervision Additional ADL Goal #1: Pt will increase to tolerate x10 mins of ADL tasks in standing with 1 seated rest break in order to increase activity tolerance.  OT Frequency: Min 2X/week   Barriers to D/C:            Co-evaluation              AM-PAC OT "6 Clicks" Daily Activity     Outcome Measure Help from another person eating meals?: None Help from another person taking care of personal grooming?: A Little Help from another person toileting, which includes using toliet, bedpan, or urinal?: A Lot Help from another person bathing (including washing, rinsing, drying)?: A Lot Help from another person to put on and taking off regular upper body clothing?: A Little Help from another person to put on and taking off regular lower body clothing?: A Lot 6 Click Score: 16   End of Session Equipment Utilized During Treatment: Gait belt;Rolling walker Nurse Communication: Mobility status  Activity Tolerance: Patient tolerated treatment well;Patient limited by pain Patient left: in chair;with call bell/phone  within reach  OT Visit Diagnosis: Unsteadiness on feet (R26.81);Muscle weakness (generalized) (M62.81)                Time: 1583-0940 OT Time Calculation (min): 19 min Charges:  OT General Charges $OT Visit: 1 Visit OT Evaluation $OT Eval  Moderate Complexity: 1 Mod  Jefferey Pica, OTR/L Acute Rehabilitation Services Pager: [phone] Office: [phone]   Caden Fatica C 09/08/2019, 4:07 PM

## 2019-09-08 NOTE — Plan of Care (Signed)

## 2019-09-08 NOTE — Progress Notes (Addendum)
Progress Note    [name]  RFV:[id number] DOB: 07-22-1932  DOA: 09/01/2019 PCP: [name], MD    Brief Narrative:    Medical records reviewed and are as summarized below:  [name] is an 86 y.o. male with a past medical history significant for A. fib, TAVR, chronic kidney disease stage IV, chronic combined CHF, AICD placement, hypertension, CAD status post CABG was admitted May 26 with severe diverticulitis.  He was provided with gentle IV fluids and IV antibiotics.  Initially he improved so clear liquids was initiated but then his pain worsened and his abdomen became distended.  Abdominal x-ray revealed ileus.  He was transitioned to n.p.o. and an NG tube was inserted.  Initially antibiotics were cefepime and then Flagyl was added.  In addition he has frequent NSVT for which cardiology was consulted on May 30.  Assessment/Plan:   Principal Problem:   Diverticulitis Active Problems:   Persistent atrial fibrillation (HCC)   Chronic combined systolic (congestive) and diastolic (congestive) heart failure (HCC)   Cardiac resynchronization therapy defibrillator (CRT-D) in place   Acute urinary retention   Metabolic acidosis   Ileus (HCC)   Essential hypertension   Stage 4 chronic kidney disease (HCC)   Hyponatremia   Dyslipidemia  # 1. Acute severe diverticulitis/ileus.  Patient reports feeling "rough".  States he feels "tight" all over.  He also reports flatus.  Nausea or abdominal pain.  Leukocytosis stable at 12.  He is afebrile hemodynamically stable.  He has an NG tube to low suction.  Repeat x-ray yesterday reveals persistent small bowel obstruction -clamp NG tube -abdominal xray few hours after clamping -if tolerates clamping consider removing NG -Continue Flagyl and cefepime -stop IV fluid   #2.  Cardiac resynchronization therapy defibrillator in place/frequent NSVT.  Patient with intermittent nonsustained V. tach.  Evaluated by cardiology on May 30 who  recommended increasing sotalol.  In addition he was given one-time dose of IV Lasix with good diuresis.  Recommending continued IV Lasix once able to take p.o.'s.  Evaluated by cardiology today who opine rhythm has improved and QT segment stable. -Continue sotalol per cardiology -Continue mexiletine -Per cardiology [employer] to add V. tach monitor zone to start at 130 bpm  #3.  Combined systolic and diastolic heart failure.  Acute on chronic.  Valuated by cardiology who recommend IV Lasix as soon as patient able to take p.o.'s.  He received 1 dose of Lasix yesterday and diuresed well.  He appears puffy and complains of feeling "tight" all over.  No shortness of breath no peripheral edema.  Of note cardiology exam notes JVD to his jaw -Daily weights -Intake and output -lasix IV one more dose today -discontinue IV fluids  #4.  Chronic hyponatremia and metabolic acidosis.  Resolved this morning. -Monitor - see above  #5.  Chronic kidney disease stage IV.  Creatinine remains at baseline. -Monitor closely  #6.  Chronic A. fib.  Rate controlled. -Continue home Eliquis and carvedilol  #7.  Urinary retention.  Appears resolved.  #8.  Essential hypertension.  Controlled. -medications as noted above  #9.  Hyperlipidemia -Statin   Family Communication/Anticipated D/C date and plan/Code Status   DVT prophylaxis: eliquis. Code Status: dnr.  Family Communication: patient Disposition Plan: Status is: Inpatient  Remains inpatient appropriate because:IV treatments appropriate due to intensity of illness or inability to take PO and Inpatient level of care appropriate due to severity of illness   Dispo: The patient is from: Home  Anticipated d/c is to: Home              Anticipated d/c date is: 2 days              Patient currently is not medically stable to d/c.          Medical Consultants:    camnitz cards   Anti-Infectives:    Cefepime 5/26>>  Flagyl  5/26>>  Subjective:   Lying in bed watching TV.  No acute distress  Objective:    Vitals:   09/07/19 2327 09/08/19 0404 09/08/19 0428 09/08/19 0747  BP: (!) 114/55 (!) 143/68  (!) 159/70  Pulse: 70 66  71  Resp: 19   20  Temp: 97.7 F (36.5 C) 98.1 F (36.7 C)  98.5 F (36.9 C)  TempSrc: Oral Oral  Oral  SpO2: 95% 96%  95%  Weight:   96.2 kg   Height:        Intake/Output Summary (Last 24 hours) at 09/08/2019 0958 Last data filed at 09/08/2019 4210 Gross per 24 hour  Intake 2167.32 ml  Output 2325 ml  Net -157.68 ml   Filed Weights   09/07/19 0327 09/07/19 1736 09/08/19 0428  Weight: 98.9 kg 95.2 kg 96.2 kg    Exam: General: Calm somewhat ill-appearing no acute distress, face and eyelids look puffy Cardiovascular: Regular rate and rhythm no murmur gallops or rub no lower extremity edema Respiratory: No increased work of breathing breath sounds are clear bilaterally I hear no wheeze no rhonchi Abdomen: Slightly distended but soft nontender to palpation sluggish bowel sounds no guarding or rebounding.  NG tube intact draining small amount greenish liquid Musculoskeletal: Joints without swelling/erythema full range of motion Neuro: Alert and oriented x3 speech clear facial symmetry   Data Reviewed:   I have personally reviewed following labs and imaging studies:  Labs: Labs show the following:   Basic Metabolic Panel: Recent Labs  Lab 09/04/19 0411 09/04/19 0411 09/05/19 0104 09/05/19 0104 09/06/19 0545 09/06/19 0545 09/07/19 0328 09/08/19 0351  NA 130*  --  132*  --  133*  --  135 138  K 4.4   < > 4.3   < > 3.7   < > 4.1 4.1  CL 103  --  104  --  104  --  105 105  CO2 19*  --  18*  --  22  --  20* 23  GLUCOSE 124*  --  133*  --  147*  --  126* 126*  BUN 37*  --  31*  --  31*  --  27* 31*  CREATININE 2.22*  --  1.96*  --  1.79*  --  1.67* 1.87*  CALCIUM 7.8*  --  8.0*  --  7.7*  --  7.8* 8.0*  MG 1.8  --   --   --  1.9  --  2.0  --    < > = values in  this interval not displayed.   GFR Estimated Creatinine Clearance: 35.4 mL/min (A) (by C-G formula based on SCr of 1.87 mg/dL (H)). Liver Function Tests: Recent Labs  Lab 09/01/19 1835 09/05/19 0104 09/06/19 0545 09/07/19 0328 09/08/19 0351  AST _0 ALT _1 ALKPHOS 49 36* 38 39 36*  BILITOT 1.2 0.5 0.7 0.9 0.7  PROT 6.4* 5.0* 4.7* 5.0* 4.9*  ALBUMIN 3.2* 2.0* 1.8* 1.9* 1.9*   Recent Labs  Lab 09/01/19 1835  LIPASE 22   No results for input(s): AMMONIA in the last 168 hours. Coagulation profile No results for input(s): INR, PROTIME in the last 168 hours.  CBC: Recent Labs  Lab 09/02/19 0445 09/03/19 0344 09/04/19 0411 09/05/19 0827 09/06/19 0545 09/07/19 0328 09/08/19 0351  WBC 20.0*   < > 10.6* 12.8* 9.4 12.9* 12.0*  NEUTROABS 16.2*  --   --  9.8* 6.1 8.5* 7.3  HGB 13.0   < > 10.9* 11.8* 11.6* 12.4* 11.8*  HCT 39.9   < > 33.6* 35.8* 35.3* 37.9* 36.9*  MCV 95.2   < > 92.8 90.9 91.5 91.1 91.8  PLT 227   < > 196 251 264 311 336   < > = values in this interval not displayed.   Cardiac Enzymes: No results for input(s): CKTOTAL, CKMB, CKMBINDEX, TROPONINI in the last 168 hours. BNP (last 3 results) No results for input(s): PROBNP in the last 8760 hours. CBG: Recent Labs  Lab 09/03/19 0545 09/03/19 0834 09/04/19 2037 09/05/19 0000 09/05/19 0428  GLUCAP 93 85 105* 106* 129*   D-Dimer: No results for input(s): DDIMER in the last 72 hours. Hgb A1c: No results for input(s): HGBA1C in the last 72 hours. Lipid Profile: No results for input(s): CHOL, HDL, LDLCALC, TRIG, CHOLHDL, LDLDIRECT in the last 72 hours. Thyroid function studies: No results for input(s): TSH, T4TOTAL, T3FREE, THYROIDAB in the last 72 hours.  Invalid input(s): FREET3 Anemia work up: No results for input(s): VITAMINB12, FOLATE, FERRITIN, TIBC, IRON, RETICCTPCT in the last 72 hours. Sepsis Labs: Recent Labs  Lab 09/02/19 1154 09/02/19 1154 09/02/19 1652  09/03/19 0344 09/05/19 0827 09/05/19 1143 09/05/19 1502 09/06/19 0545 09/07/19 0328 09/07/19 0824 09/08/19 0351  PROCALCITON 3.38  --   --   --   --   --   --   --   --   --   --   WBC  --   --   --    < > 12.8*  --   --  9.4 12.9*  --  12.0*  LATICACIDVEN 1.9   < > 1.8  --   --  1.2 1.0  --   --  1.3  --    < > = values in this interval not displayed.    Microbiology Recent Results (from the past 240 hour(s))  Blood Culture (routine x 2)     Status: None   Collection Time: 09/02/19  8:10 AM   Specimen: BLOOD RIGHT ARM  Result Value Ref Range Status   Specimen Description BLOOD RIGHT ARM  Final   Special Requests   Final    BOTTLES DRAWN AEROBIC AND ANAEROBIC Blood Culture adequate volume   Culture   Final    NO GROWTH 5 DAYS Performed at [facility], [address], [place], [place] [address]    Report Status 09/07/2019 FINAL  Final  Blood Culture (routine x 2)     Status: Abnormal   Collection Time: 09/02/19  8:10 AM   Specimen: BLOOD  Result Value Ref Range Status   Specimen Description BLOOD RIGHT ANTECUBITAL  Final   Special Requests   Final    BOTTLES DRAWN AEROBIC AND ANAEROBIC Blood Culture results may not be optimal due to an excessive volume of blood received in culture bottles   Culture  Setup Time   Final    GRAM POSITIVE RODS ANAEROBIC BOTTLE ONLY CRITICAL RESULT CALLED TO, READ BACK BY AND VERIFIED WITH: [place]  [id number] FCP    Culture (A)  Final    DIPHTHEROIDS(CORYNEBACTERIUM SPECIES) Standardized susceptibility testing for this organism is not available. Performed at [facility], [place] [address], [place], [place] [address]    Report Status 09/05/2019 FINAL  Final  SARS Coronavirus 2 by RT PCR (hospital order, performed in [facility] hospital lab) Nasopharyngeal Nasopharyngeal Swab     Status: None   Collection Time: 09/02/19  8:12 AM   Specimen: Nasopharyngeal Swab  Result Value Ref Range Status   SARS Coronavirus 2  NEGATIVE NEGATIVE Final    Comment: (NOTE) SARS-CoV-2 target nucleic acids are NOT DETECTED. The SARS-CoV-2 RNA is generally detectable in upper and lower respiratory specimens during the acute phase of infection. The lowest concentration of SARS-CoV-2 viral copies this assay can detect is 250 copies / mL. A negative result does not preclude SARS-CoV-2 infection and should not be used as the sole basis for treatment or other patient management decisions.  A negative result may occur with improper specimen collection / handling, submission of specimen other than nasopharyngeal swab, presence of viral mutation(s) within the areas targeted by this assay, and inadequate number of viral copies (<250 copies / mL). A negative result must be combined with clinical observations, patient history, and epidemiological information. Fact Sheet for Patients:   StrictlyIdeas.no Fact Sheet for Healthcare Providers: BankingDealers.co.za This test is not yet approved or cleared  by the [place] FDA and has been authorized for detection and/or diagnosis of SARS-CoV-2 by FDA under an Emergency Use Authorization (EUA).  This EUA will remain in effect (meaning this test can be used) for the duration of the COVID-19 declaration under Section 564(b)(1) of the Act, 21 U.S.C. section 360bbb-3(b)(1), unless the authorization is terminated or revoked sooner. Performed at [facility], [place] [address] [place], [place] [address]   Urine culture     Status: Abnormal   Collection Time: 09/02/19  9:45 AM   Specimen: In/Out Cath Urine  Result Value Ref Range Status   Specimen Description IN/OUT CATH URINE  Final   Special Requests   Final    NONE Performed at [facility], [place] [address], [place], [place] [address]    Culture MULTIPLE SPECIES PRESENT, SUGGEST RECOLLECTION (A)  Final   Report Status 09/03/2019 FINAL  Final    Procedures and  diagnostic studies:  DG Abd 1 View  Result Date: 09/07/2019 CLINICAL DATA:  Ileus EXAM: ABDOMEN - 1 VIEW COMPARISON:  Sep 05, 2019 FINDINGS: Nasogastric tube tip is at the gastroesophageal junction. There remain loops of dilated small bowel with contrast seen in the colon. No free air evident. Lung bases clear. IMPRESSION: Nasogastric tube tip at gastroesophageal junction. Advise advancing nasogastric tube 5-6 cm. Persistent small bowel dilatation, likely due to ileus. A degree of partial bowel obstruction cannot be excluded. Note that there is air in the rectum, and contrast is seen in the colon. No appreciable free air. These results will be called to the ordering clinician or representative by the Radiologist Assistant, and communication documented in the PACS or [employer]. Electronically Signed   By: Lowella Grip III M.D.   On: 09/07/2019 08:50   ECHOCARDIOGRAM COMPLETE  Result Date: 09/07/2019    ECHOCARDIOGRAM REPORT   Patient Name:   VANNIE HOCHSTETLER Date of Exam: 09/07/2019 Medical Rec #:  [id number]       Height:       72.0 in Accession #:  [id number]      Weight:       218.0 lb Date of Birth:  1933-03-22       BSA:          2.210 m Patient Age:    80 years        BP:           154/72 mmHg Patient Gender: M               HR:           76 bpm. Exam Location:  Inpatient Procedure: 2D Echo Indications:    ventricular tacycardia  History:        Patient has prior history of Echocardiogram examinations, most                 recent 05/27/2017. Prior CABG, chronic kidney disease,                 Arrythmias:Atrial Fibrillation; Risk Factors:Hypertension and                 Dyslipidemia.                 Aortic Valve: 23 mm Magna Ease pericardial valve is present in                 the aortic position. Procedure Date: 03/10/2008.  Sonographer:    Johny Chess Referring Phys: [place]  1. Left ventricular ejection fraction, by estimation, is 30 to 35%. The left ventricle  has moderately decreased function. Left ventricular endocardial border not optimally defined to evaluate regional wall motion. Left ventricular diastolic parameters are consistent with Grade II diastolic dysfunction (pseudonormalization). Elevated left ventricular end-diastolic pressure.  2. Right ventricular systolic function is moderately reduced. The right ventricular size is normal. Tricuspid regurgitation signal is inadequate for assessing PA pressure.  3. Left atrial size was mildly dilated.  4. The mitral valve is abnormal. Trivial mitral valve regurgitation. No evidence of mitral stenosis.  5. The aortic valve has been repaired/replaced. Aortic valve regurgitation is not visualized. There is a 23 mm Magna Ease pericardial valve present in the aortic position. Procedure Date: 03/10/2008. Echo findings are consistent with normal structure and  function of the aortic valve prosthesis. Aortic valve mean gradient measures 6.0 mmHg. FINDINGS  Left Ventricle: Left ventricular ejection fraction, by estimation, is 30 to 35%. The left ventricle has moderately decreased function. Left ventricular endocardial border not optimally defined to evaluate regional wall motion. The left ventricular internal cavity size was normal in size. There is no left ventricular hypertrophy. Left ventricular diastolic parameters are consistent with Grade II diastolic dysfunction (pseudonormalization). Elevated left ventricular end-diastolic pressure. Right Ventricle: The right ventricular size is normal. Right vetricular wall thickness was not assessed. Right ventricular systolic function is moderately reduced. Tricuspid regurgitation signal is inadequate for assessing PA pressure. Left Atrium: Left atrial size was mildly dilated. Right Atrium: Right atrial size was normal in size. Pericardium: There is no evidence of pericardial effusion. Mitral Valve: The mitral valve is abnormal. Moderately decreased mobility of the mitral valve  leaflets. Severe mitral annular calcification. Trivial mitral valve regurgitation. No evidence of mitral valve stenosis. Tricuspid Valve: The tricuspid valve is not well visualized. Tricuspid valve regurgitation is trivial. Aortic Valve: The aortic valve has been repaired/replaced. Aortic valve regurgitation is not visualized. Aortic valve mean gradient measures 6.0 mmHg. Aortic valve peak gradient measures 11.0 mmHg. Aortic valve area, by VTI measures 2.05  cm. There is a 23 mm Magna Ease pericardial valve present in the aortic position. Procedure Date: 03/10/2008. Echo findings are consistent with normal structure and function of the aortic valve prosthesis. Pulmonic Valve: The pulmonic valve was not well visualized. Pulmonic valve regurgitation is trivial. Aorta: The aortic root is normal in size and structure. IAS/Shunts: The interatrial septum was not well visualized.  LEFT VENTRICLE PLAX 2D LVIDd:         5.00 cm     Diastology LVIDs:         4.60 cm     LV e' lateral:   7.18 cm/s LV PW:         0.90 cm     LV E/e' lateral: 15.3 LV IVS:        0.80 cm     LV e' medial:    6.96 cm/s LVOT diam:     2.30 cm     LV E/e' medial:  15.8 LV SV:         70 LV SV Index:   32 LVOT Area:     4.15 cm  LV Volumes (MOD) LV vol d, MOD A4C: 72.1 ml LV vol s, MOD A4C: 45.3 ml LV SV MOD A4C:     72.1 ml RIGHT VENTRICLE RV S prime:     8.92 cm/s LEFT ATRIUM             Index       RIGHT ATRIUM           Index LA diam:        4.40 cm 1.99 cm/m  RA Area:     15.20 cm LA Vol (A2C):   82.4 ml 37.28 ml/m RA Volume:   35.90 ml  16.24 ml/m LA Vol (A4C):   89.5 ml 40.49 ml/m LA Biplane Vol: 85.8 ml 38.82 ml/m  AORTIC VALVE AV Area (Vmax):    2.12 cm AV Area (Vmean):   1.94 cm AV Area (VTI):     2.05 cm AV Vmax:           166.00 cm/s AV Vmean:          114.000 cm/s AV VTI:            0.344 m AV Peak Grad:      11.0 mmHg AV Mean Grad:      6.0 mmHg LVOT Vmax:         84.55 cm/s LVOT Vmean:        53.200 cm/s LVOT VTI:           0.170 m LVOT/AV VTI ratio: 0.49  AORTA Ao Root diam: 3.20 cm MITRAL VALVE MV Area (PHT): 2.76 cm     SHUNTS MV Decel Time: 275 msec     Systemic VTI:  0.17 m MV E velocity: 110.00 cm/s  Systemic Diam: 2.30 cm MV A velocity: 87.80 cm/s MV E/A ratio:  1.25 Cherlynn Kaiser MD Electronically signed by Cherlynn Kaiser MD Signature Date/Time: 09/07/2019/3:31:58 PM    Final     Medications:   . apixaban  2.5 mg Oral BID  . atorvastatin  20 mg Oral QPM  . carvedilol  25 mg Oral BID WC  . docusate sodium  100 mg Oral BID  . famotidine  40 mg Oral QPM  . hydrALAZINE  12.5 mg Oral BID AC  . isosorbide mononitrate  30 mg Oral QPM  . mexiletine  300 mg Oral BID  . pantoprazole  40  mg Oral Daily  . sodium chloride flush  3 mL Intravenous Q12H  . sotalol  80 mg Oral BID   Continuous Infusions: . ceFEPime (MAXIPIME) IV 2 g (09/07/19 2221)  . lactated ringers 75 mL/hr at 09/07/19 2304  . metronidazole 500 mg (09/08/19 0658)     LOS: 6 days   Radene Gunning NP [facility]   How to contact the [facility] Attending or Consulting provider [place] or covering provider during after hours [place], for this patient?  1. Check the care team in [facility] and look for a) attending/consulting [facility] provider listed and b) the [facility] team listed 2. Log into [email or web address] and use [place] universal password to access. If you do not have the password, please contact the hospital operator. 3. Locate the [facility] provider you are looking for under [facility] and page to a number that you can be directly reached. 4. If you still have difficulty reaching the provider, please page the [facility] (Director on Call) for the Hospitalists listed on amion for assistance.  09/08/2019, 9:58 AM

## 2019-09-08 NOTE — Progress Notes (Addendum)
Progress Note  Patient Name: [name] Date of Encounter: 09/08/2019  Primary Cardiologist: [name], MD   Subjective   No CP, palpitations  Inpatient Medications    Scheduled Meds:  apixaban  2.5 mg Oral BID   atorvastatin  20 mg Oral QPM   carvedilol  25 mg Oral BID WC   docusate sodium  100 mg Oral BID   famotidine  40 mg Oral QPM   hydrALAZINE  12.5 mg Oral BID AC   isosorbide mononitrate  30 mg Oral QPM   mexiletine  300 mg Oral BID   pantoprazole  40 mg Oral Daily   sodium chloride flush  3 mL Intravenous Q12H   sotalol  80 mg Oral BID   Continuous Infusions:  ceFEPime (MAXIPIME) IV 2 g (09/07/19 2221)   lactated ringers 75 mL/hr at 09/07/19 2304   metronidazole 500 mg (09/08/19 0658)   PRN Meds: acetaminophen **OR** acetaminophen, albuterol, bisacodyl, diphenhydrAMINE, hydrALAZINE, HYDROcodone-acetaminophen, morphine injection, ondansetron **OR** ondansetron (ZOFRAN) IV, phenol, polyethylene glycol, zolpidem   Vital Signs    Vitals:   09/07/19 2327 09/08/19 0404 09/08/19 0428 09/08/19 0747  BP: (!) 114/55 (!) 143/68  (!) 159/70  Pulse: 70 66  71  Resp: 19   20  Temp: 97.7 F (36.5 C) 98.1 F (36.7 C)  98.5 F (36.9 C)  TempSrc: Oral Oral  Oral  SpO2: 95% 96%  95%  Weight:   96.2 kg   Height:        Intake/Output Summary (Last 24 hours) at 09/08/2019 0831 Last data filed at 09/08/2019 0658 Gross per 24 hour  Intake 2167.32 ml  Output 2425 ml  Net -257.68 ml   Last 3 Weights 09/08/2019 09/07/2019 09/07/2019  Weight (lbs) 212 lb 1.3 oz 209 lb 14.1 oz 218 lb 0.6 oz  Weight (kg) 96.2 kg 95.2 kg 98.9 kg      Telemetry    SR w/V pacing, one 7beat wide complex rhythm, approx 120bpm - Personally Reviewed  ECG    SR, V paced Manually measured QT 480-524m, QRS 162m accounting for paced QRS duration, QTc 479-50051m Personally Reviewed with Dr. CamCurt Bearshysical Exam   GEN: No acute distress.   Neck: No JVD Cardiac: RRR,soft SM, rubs, or  gallops.  Respiratory: Clear to auscultation bilaterally. GI: Soft, nontender, perhaps mildly distended  MS: No edema; No deformity. Neuro:  Nonfocal  Psych: Normal affect   Labs    High Sensitivity Troponin:  No results for input(s): TROPONINIHS in the last 720 hours.    Chemistry Recent Labs  Lab 09/06/19 0545 09/07/19 0328 09/08/19 0351  NA 133* 135 138  K 3.7 4.1 4.1  CL 104 105 105  CO2 22 20* 23  GLUCOSE 147* 126* 126*  BUN 31* 27* 31*  CREATININE 1.79* 1.67* 1.87*  CALCIUM 7.7* 7.8* 8.0*  PROT 4.7* 5.0* 4.9*  ALBUMIN 1.8* 1.9* 1.9*  AST _0 ALT _1 ALKPHOS 38 39 36*  BILITOT 0.7 0.9 0.7  GFRNONAA 34* 37* 32*  GFRAA 39* 43* 37*  ANIONGAP _2 Hematology Recent Labs  Lab 09/06/19 0545 09/07/19 0328 09/08/19 0351  WBC 9.4 12.9* 12.0*  RBC 3.86* 4.16* 4.02*  HGB 11.6* 12.4* 11.8*  HCT 35.3* 37.9* 36.9*  MCV 91.5 91.1 91.8  MCH 30.1 29.8 29.4  MCHC 32.9 32.7 32.0  RDW 15.0 14.7 15.0  PLT 264 311 336  BNPNo results for input(s): BNP, PROBNP in the last 168 hours.   DDimer No results for input(s): DDIMER in the last 168 hours.   Radiology    DG Abd 1 View Result Date: 09/07/2019 CLINICAL DATA:  Ileus EXAM: ABDOMEN - 1 VIEW COMPARISON:  Sep 05, 2019 FINDINGS: Nasogastric tube tip is at the gastroesophageal junction. There remain loops of dilated small bowel with contrast seen in the colon. No free air evident. Lung bases clear. IMPRESSION: Nasogastric tube tip at gastroesophageal junction. Advise advancing nasogastric tube 5-6 cm. Persistent small bowel dilatation, likely due to ileus. A degree of partial bowel obstruction cannot be excluded. Note that there is air in the rectum, and contrast is seen in the colon. No appreciable free air. These results Howard Bunte be called to the ordering clinician or representative by the Radiologist Assistant, and communication documented in the PACS or [employer]. Electronically Signed   By: Lowella Grip III M.D.   On: 09/07/2019 08:50      Cardiac Studies   09/07/2019: TTE IMPRESSIONS   1. Left ventricular ejection fraction, by estimation, is 30 to 35%. The  left ventricle has moderately decreased function. Left ventricular  endocardial border not optimally defined to evaluate regional wall motion.  Left ventricular diastolic parameters  are consistent with Grade II diastolic dysfunction (pseudonormalization).  Elevated left ventricular end-diastolic pressure.   2. Right ventricular systolic function is moderately reduced. The right  ventricular size is normal. Tricuspid regurgitation signal is inadequate  for assessing PA pressure.   3. Left atrial size was mildly dilated.   4. The mitral valve is abnormal. Trivial mitral valve regurgitation. No  evidence of mitral stenosis.   5. The aortic valve has been repaired/replaced. Aortic valve  regurgitation is not visualized. There is a 23 mm Magna Ease pericardial  valve present in the aortic position. Procedure Date: 03/10/2008. Echo  findings are consistent with normal structure and   function of the aortic valve prosthesis. Aortic valve mean gradient  measures 6.0 mmHg.    05/27/2017: LVEF 20-25%    05/31/2017: R/LHC Prox LAD to Mid LAD lesion is 50% stenosed. Mid LAD to Dist LAD lesion is 25% stenosed. 1st Mrg lesion is 50% stenosed. Prox RCA to Dist RCA lesion is 100% stenosed. And is large. Prox Graft to Mid Graft lesion is 30% stenosed. And is large. The flow in the graft is reversed. There is competitive flow. And is large. There is competitive flow. Ost 1st Diag to 1st Diag lesion is 90% stenosed.   Findings:   Ao = 145/67 (99)  RA = 6 RV = 70/9 PA = 69/30 (46) PCW = 26 v = 37 Fick cardiac output/index =4.7/2.3 PVR = 4.3 FA sat = 98% PA sat = 63%, 60%   Assessment:   1. 3v CAD with stable revascularization with patent LIMA-LAD, SVG-diagonal and SVG-RCA 2. 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 Normal cardiac output - no evidence of thyrotoxicosis heart disease or PH    Patient Profile     86 y.o. male male with a hx of atrial fibrillation coronary artery disease status post CABG x3 with AVR, CKD stage IV, chronic systolic and diastolic heart failure with an ejection fraction of 25 to 30%, VT, PAFib  Admitted to [facility] with severe diverticulitis placed on IV fluids and he improved. He was worsened when he started a liquid diet and was found to  have an ileus. An NG tube was placed (remains)  Developed VT (below his detection)   Device information MDT CRT-D, implanted1/13/17 + hx of appropriate therapy and VT AAD Hx: Amiodarone stopped Jan 2019, 2/2 SOB,  No clear finding of amio tox >> mexiletine  Assessment & Plan    1. VT     Sotalol added to his mexiletine     Rhythm has improved   Discussed renal function and current sotalol dosing with Dr. Curt Bears.  Continue 3m BID, QT stable, he has an ICD Discussed with [employer] to add VT monitor zone to start at 130bpm, no change in current therapy zones    2. Acute/chronic CHF (systolic)     Had good urine OP yesterday with IV lasix dose, though note IM wants pt to stay on IVF (fetting LR at 75/hr currently      Once he is getting PO, or able to stop fluids recommend IV lasix, he has JVD to his jaw, not SOB, no peripheral edema    3. H/o PAF     On eliquis appropriately dosed for age/Creat     None noted here   Dr. CCurt Bearshas seen and examined the patient, start diuresis when fluids are able to be stopped Continue same mexiletine and sotalol Device reprogramming as above      For questions or updates, please contact CGulfPlease consult [email or web address] for contact info under        Signed, RBaldwin Jamaica PA-C  09/08/2019, 8:31 AM    I have seen and examined this patient with RTommye Standard  Agree with above, note added to reflect my findings.   On exam, RRR, no murmurs, JVD to the jaw when laying at 30 degrees.  Sotalol dose has been increased to 80 mg.  His VT burden has significantly reduced.  Would continue his sotalol and mexiletine at current doses.  He is certainly volume overloaded.  He is currently getting IV fluids as he is n.p.o.  Once he is taking oral medications, would call cardiology back to discuss volume issues.  EP Wylee Ogden continue to follow from a far as long as his rhythm issues remained stable.  Antinio Sanderfer M. Woodward Klem MD 09/08/2019 9:50 AM

## 2019-09-08 NOTE — Progress Notes (Signed)
Inpatient Rehab Admissions:  Inpatient Rehab Consult received.  I met with pt and his son at the bedside for rehabilitation assessment. Feel pt is a good candidate at this time for an IP Rehab admission if I can confirm DC support. Discussed recommended program details, expected LOS, and anticipated functional outcomes at DC. Pt somewhat hesitant to commit to this program at this time and wants to speak with his son and the rest of his family prior to making a decision. With his permission I left a voicemail for his wife; await follow up call to discuss.   Will follow up tomorrow to see if pt would like to pursue this program and if supervision is available at DC to support an IP Rehab stay.   Raechel Ache, OTR/L  Rehab Admissions Coordinator  [phone] 09/08/2019 2:34 PM

## 2019-09-09 DIAGNOSIS — K5792 Diverticulitis of intestine, part unspecified, without perforation or abscess without bleeding: Secondary | ICD-10-CM

## 2019-09-09 LAB — CBC WITH DIFFERENTIAL/PLATELET
Abs Immature Granulocytes: 0.56 10*3/uL — ABNORMAL HIGH (ref 0.00–0.07)
Basophils Absolute: 0.1 10*3/uL (ref 0.0–0.1)
Basophils Relative: 1 %
Eosinophils Absolute: 0.3 10*3/uL (ref 0.0–0.5)
Eosinophils Relative: 2 %
HCT: 37.1 % — ABNORMAL LOW (ref 39.0–52.0)
Hemoglobin: 11.9 g/dL — ABNORMAL LOW (ref 13.0–17.0)
Immature Granulocytes: 5 %
Lymphocytes Relative: 17 %
Lymphs Abs: 2.1 10*3/uL (ref 0.7–4.0)
MCH: 29.8 pg (ref 26.0–34.0)
MCHC: 32.1 g/dL (ref 30.0–36.0)
MCV: 92.8 fL (ref 80.0–100.0)
Monocytes Absolute: 1.6 10*3/uL — ABNORMAL HIGH (ref 0.1–1.0)
Monocytes Relative: 13 %
Neutro Abs: 7.9 10*3/uL — ABNORMAL HIGH (ref 1.7–7.7)
Neutrophils Relative %: 62 %
Platelets: 366 10*3/uL (ref 150–400)
RBC: 4 MIL/uL — ABNORMAL LOW (ref 4.22–5.81)
RDW: 15.1 % (ref 11.5–15.5)
WBC: 12.5 10*3/uL — ABNORMAL HIGH (ref 4.0–10.5)
nRBC: 0 % (ref 0.0–0.2)

## 2019-09-09 LAB — BASIC METABOLIC PANEL
Anion gap: 14 (ref 5–15)
BUN: 37 mg/dL — ABNORMAL HIGH (ref 8–23)
CO2: 21 mmol/L — ABNORMAL LOW (ref 22–32)
Calcium: 8 mg/dL — ABNORMAL LOW (ref 8.9–10.3)
Chloride: 103 mmol/L (ref 98–111)
Creatinine, Ser: 1.95 mg/dL — ABNORMAL HIGH (ref 0.61–1.24)
GFR calc Af Amer: 36 mL/min — ABNORMAL LOW (ref >60)
GFR calc non Af Amer: 31 mL/min — ABNORMAL LOW (ref >60)
Glucose, Bld: 114 mg/dL — ABNORMAL HIGH (ref 70–99)
Potassium: 3.9 mmol/L (ref 3.5–5.1)
Sodium: 138 mmol/L (ref 135–145)

## 2019-09-09 LAB — MAGNESIUM: Magnesium: 2 mg/dL (ref 1.7–2.4)

## 2019-09-09 MED ORDER — SODIUM CHLORIDE 0.9 % IV SOLN: INTRAVENOUS | Status: DC

## 2019-09-09 NOTE — Progress Notes (Signed)
Physical Therapy Treatment Patient Details Name: [name] MRN: [id number] DOB: 02-22-1933 Today's Date: 09/09/2019    History of Present Illness 86 yo male presented to ED 09/02/19 with onset of generalized weakness after a flare of his diverticulitis with metabolic acidosis, acute urinary retention.  No perf on CT, severe diverticulitis on non-contrast CT 09/02/19. Pt currently being monitored for recurring Vtach that started 5/29. PMHx: TAVR, a-fib, CKD4, CHF, AICD, HTN, CAD with CABG, diverticulitis.    PT Comments    Pt admitted with above diagnosis. Pt was able to ambulate with min guard to min assist with RW to hallway today with cues for posture at times but overall safe with RW and incr distance. Should be able to progress and go home with HHPT f/u and wife support.  Pt currently with functional limitations due to balance and endurance deficits). Pt will benefit from skilled PT to increase their independence and safety with mobility to allow discharge to the venue listed below.     Follow Up Recommendations  Home health PT;Supervision/Assistance - 24 hour     Equipment Recommendations  Other (comment)(TBD)    Recommendations for Other Services Rehab consult     Precautions / Restrictions Precautions Precautions: Fall;Other (comment) Precaution Comments: NG tube Restrictions Weight Bearing Restrictions: No    Mobility  Bed Mobility   Bed Mobility: Supine to Sit     Supine to sit: Min guard Sit to supine: Min guard   General bed mobility comments: Did not have to help pt come to EOB.   Transfers Overall transfer level: Needs assistance Equipment used: Rolling walker (2 wheeled) Transfers: Sit to/from Stand Sit to Stand: Min assist;Min guard;From elevated surface         General transfer comment: Assist slihgtly to power up and pt needed to use momentum with cues for hand placement.   Ambulation/Gait Ambulation/Gait assistance: Min assist;Min guard Gait  Distance (Feet): 70 Feet Assistive device: Rolling walker (2 wheeled) Gait Pattern/deviations: Step-to pattern;Wide base of support;Trunk flexed;Decreased stride length;Antalgic;Drifts right/left Gait velocity: reduced Gait velocity interpretation: <1.8 ft/sec, indicate of risk for recurrent falls General Gait Details: Needed guard assist for stability, cues to stay close to RW and some assist for postural stability.     Stairs             Wheelchair Mobility    Modified Rankin (Stroke Patients Only)       Balance Overall balance assessment: Needs assistance Sitting-balance support: Feet supported;Bilateral upper extremity supported Sitting balance-Leahy Scale: Fair Sitting balance - Comments: Pt reported feeling dizzy at EOB, VSS.   Standing balance support: Bilateral upper extremity supported;During functional activity Standing balance-Leahy Scale: Poor Standing balance comment: Pt relies on UE support but light support needed with RW.                             Cognition Arousal/Alertness: Awake/alert Behavior During Therapy: WFL for tasks assessed/performed Overall Cognitive Status: Within Functional Limits for tasks assessed                                        Exercises Total Joint Exercises Ankle Circles/Pumps: AROM;10 reps;Both;Seated Long Arc Quad: AROM;Both;Seated;10 reps General Exercises - Lower Extremity Hip Flexion/Marching: AROM;Both;15 reps;Seated    General Comments General comments (skin integrity, edema, etc.): VSS      Pertinent Vitals/Pain  Pain Assessment: Faces Faces Pain Scale: Hurts a little bit Pain Location: abdomen with movement Pain Descriptors / Indicators: Discomfort;Guarding Pain Intervention(s): Limited activity within patient's tolerance;Monitored during session;Repositioned    Home Living                      Prior Function            PT Goals (current goals can now be found in  the care plan section) Acute Rehab PT Goals Patient Stated Goal: to walk and get home Progress towards PT goals: Progressing toward goals    Frequency    Min 3X/week      PT Plan Discharge plan needs to be updated    Co-evaluation              AM-PAC PT "6 Clicks" Mobility   Outcome Measure  Help needed turning from your back to your side while in a flat bed without using bedrails?: A Little Help needed moving from lying on your back to sitting on the side of a flat bed without using bedrails?: A Little Help needed moving to and from a bed to a chair (including a wheelchair)?: A Little Help needed standing up from a chair using your arms (e.g., wheelchair or bedside chair)?: A Little Help needed to walk in hospital room?: A Little Help needed climbing 3-5 steps with a railing? : A Lot 6 Click Score: 17    End of Session Equipment Utilized During Treatment: Gait belt Activity Tolerance: Other (comment)(Pt limited by generalized weakness) Patient left: in chair;with call bell/phone within reach;with chair alarm set Nurse Communication: Mobility status PT Visit Diagnosis: Unsteadiness on feet (R26.81);Muscle weakness (generalized) (M62.81);Difficulty in walking, not elsewhere classified (R26.2);Ataxic gait (R26.0);Pain Pain - Right/Left: (abdomen) Pain - part of body: (abdomen)     Time: 2836-6294 PT Time Calculation (min) (ACUTE ONLY): 16 min  Charges:  $Gait Training: 8-22 mins                     Daron Stutz W,PT [place] Pager:  [phone]  Office:  [place] 09/09/2019, 12:41 PM

## 2019-09-09 NOTE — Progress Notes (Signed)
Inpatient Rehabilitation-Admissions Coordinator   Was able to confirm supervision support at DC from pt's wife this AM. Met with pt bedside this AM who states he wants to see how he does with PT today prior to making a decision about pursuing CIR vs home as he feels he is much stronger today and feeling better. Will follow up with pt later this afternoon once he has worked with therapy to see how he does and what venue he prefers for his post acute rehab.   Raechel Ache, OTR/L  Rehab Admissions Coordinator  [phone] 09/09/2019 11:20 AM

## 2019-09-09 NOTE — Plan of Care (Signed)

## 2019-09-09 NOTE — Progress Notes (Signed)
[facility] PROGRESS NOTE    Progress Note  [name]  HYI:[id number] DOB: 02/21/1933 DOA: 09/01/2019 PCP: [name], MD     Brief Narrative:   [name] is an 86 y.o. male admitted to the hospital with acute diverticulitis and nonsustained V. tach and ileus.  NG tube was placed and he has been treated with conservative management.  His NG tube was clamped on 09/08/2019 he was only putting about 500 cc.  X-ray was obtained that showed dilated loops.  Assessment/Plan:   Acute severe diverticulitis/ileus: He was started empirically on IV cefepime and Flagyl, will place n.p.o. NG tube was placed. He will need 10 to 14 days of IV empiric antibiotics is on day 8. Repeated x-ray showed contrast in the colon.  She relates he is Plasse flatus, has not had a bowel movement.  Continue NG tube clamped to allow him to have ice chips. KVO IV fluids.  Chronic combined systolic and diastolic heart failure: Evaluated by cardiology recommended IV Lasix in sinus patient taking orals. He did receive a single dose of Lasix today prior to this note and he diuresed well. He is mildly fluid overloaded on physical exam KVO IV fluids has positive lateral jugular reflux.  Chronic hyponatremia and metabolic acidosis: Resolved this morning.  Chronic kidney disease stage IV: Creatinine remained at baseline.  Chronic atrial fibrillation: Rate controlled Coreg continue Eliquis.  Urinary retention: Appears to be resolved.     DVT prophylaxis: Eliquis Family Communication:none Status is: Inpatient  Remains inpatient appropriate because:Hemodynamically unstable and IV treatments appropriate due to intensity of illness or inability to take PO   Dispo: The patient is from: Home              Anticipated d/c is to: SNF              Anticipated d/c date is: 3 days              Patient currently is not medically stable to d/c.         Code Status:     Code Status Orders   (From admission, onward)         Start     Ordered   09/02/19 1119  Do not attempt resuscitation (DNR)  Continuous    Question Answer Comment  In the event of cardiac or respiratory ARREST Do not call a "code blue"   In the event of cardiac or respiratory ARREST Do not perform Intubation, CPR, defibrillation or ACLS   In the event of cardiac or respiratory ARREST Use medication by any route, position, wound care, and other measures to relive pain and suffering. May use oxygen, suction and manual treatment of airway obstruction as needed for comfort.      09/02/19 1121        Code Status History    Date Active Date Inactive Code Status Order ID Comments User Context   11/29/2018 1455 12/01/2018 2053 Full Code [id number]  Albertine Patricia, MD ED   06/15/2017 2214 06/20/2017 1940 Full Code [id number]  Marcie Mowers, MD ED   05/26/2017 1733 06/04/2017 2006 Full Code [id number]  Vashti Hey, MD ED   05/08/2017 1516 05/10/2017 1808 Full Code [id number]  Brenton Grills, PA-C ED   06/16/2015 1305 06/19/2015 1646 Full Code [id number]  Elgergawy, Silver Huguenin, MD ED   04/30/2015 2256 05/02/2015 1852 Full Code [id number]  Kinnie Feil, MD Inpatient   04/22/2015 1504 04/23/2015 1457  Full Code [id number]  Constance Haw, MD Inpatient   11/04/2014 3582 11/04/2014 1454 Full Code [id number]  Leonie Man, MD Inpatient   08/23/2014 0117 08/26/2014 1646 Full Code [id number]  Rise Patience, MD Inpatient   Advance Care Planning Activity        IV Access:    Peripheral IV   Procedures and diagnostic studies:   DG Abd Portable 1V  Result Date: 09/08/2019 CLINICAL DATA:  Ileus. EXAM: PORTABLE ABDOMEN - 1 VIEW COMPARISON:  09/07/2019. FINDINGS: AICD noted in good anatomic position. NG tube noted with tip at the gastroesophageal junction. Advancement of approximately 10 cm should be considered. Distended loops of small bowel again noted without interim change. Colonic gas  pattern is stable. Oral contrast in the colon. IMPRESSION: NG tube noted with tip at the gastroesophageal junction. Advancement of approximately 10 cm suggested. 2. Persistent distended loops of small bowel. Colon is nondistended. Oral contrast in the colon. Continued follow-up exams to demonstrate resolution of bowel distention suggested. Electronically Signed   By: Marcello Moores  Register   On: 09/08/2019 14:12   ECHOCARDIOGRAM COMPLETE  Result Date: 09/07/2019    ECHOCARDIOGRAM REPORT   Patient Name:   [name] Date of Exam: 09/07/2019 Medical Rec #:  [id number]       Height:       72.0 in Accession #:    [id number]      Weight:       218.0 lb Date of Birth:  September 30, 1932       BSA:          2.210 m Patient Age:    8 years        BP:           154/72 mmHg Patient Gender: M               HR:           76 bpm. Exam Location:  Inpatient Procedure: 2D Echo Indications:    ventricular tacycardia  History:        Patient has prior history of Echocardiogram examinations, most                 recent 05/27/2017. Prior CABG, chronic kidney disease,                 Arrythmias:Atrial Fibrillation; Risk Factors:Hypertension and                 Dyslipidemia.                 Aortic Valve: 23 mm Magna Ease pericardial valve is present in                 the aortic position. Procedure Date: 03/10/2008.  Sonographer:    Johny Chess Referring Phys: [place]  1. Left ventricular ejection fraction, by estimation, is 30 to 35%. The left ventricle has moderately decreased function. Left ventricular endocardial border not optimally defined to evaluate regional wall motion. Left ventricular diastolic parameters are consistent with Grade II diastolic dysfunction (pseudonormalization). Elevated left ventricular end-diastolic pressure.  2. Right ventricular systolic function is moderately reduced. The right ventricular size is normal. Tricuspid regurgitation signal is inadequate for assessing PA pressure.  3.  Left atrial size was mildly dilated.  4. The mitral valve is abnormal. Trivial mitral valve regurgitation. No evidence of mitral stenosis.  5. The aortic valve has been repaired/replaced. Aortic valve regurgitation is not visualized. There is  a 23 mm Magna Ease pericardial valve present in the aortic position. Procedure Date: 03/10/2008. Echo findings are consistent with normal structure and  function of the aortic valve prosthesis. Aortic valve mean gradient measures 6.0 mmHg. FINDINGS  Left Ventricle: Left ventricular ejection fraction, by estimation, is 30 to 35%. The left ventricle has moderately decreased function. Left ventricular endocardial border not optimally defined to evaluate regional wall motion. The left ventricular internal cavity size was normal in size. There is no left ventricular hypertrophy. Left ventricular diastolic parameters are consistent with Grade II diastolic dysfunction (pseudonormalization). Elevated left ventricular end-diastolic pressure. Right Ventricle: The right ventricular size is normal. Right vetricular wall thickness was not assessed. Right ventricular systolic function is moderately reduced. Tricuspid regurgitation signal is inadequate for assessing PA pressure. Left Atrium: Left atrial size was mildly dilated. Right Atrium: Right atrial size was normal in size. Pericardium: There is no evidence of pericardial effusion. Mitral Valve: The mitral valve is abnormal. Moderately decreased mobility of the mitral valve leaflets. Severe mitral annular calcification. Trivial mitral valve regurgitation. No evidence of mitral valve stenosis. Tricuspid Valve: The tricuspid valve is not well visualized. Tricuspid valve regurgitation is trivial. Aortic Valve: The aortic valve has been repaired/replaced. Aortic valve regurgitation is not visualized. Aortic valve mean gradient measures 6.0 mmHg. Aortic valve peak gradient measures 11.0 mmHg. Aortic valve area, by VTI measures 2.05 cm. There  is a 23 mm Magna Ease pericardial valve present in the aortic position. Procedure Date: 03/10/2008. Echo findings are consistent with normal structure and function of the aortic valve prosthesis. Pulmonic Valve: The pulmonic valve was not well visualized. Pulmonic valve regurgitation is trivial. Aorta: The aortic root is normal in size and structure. IAS/Shunts: The interatrial septum was not well visualized.  LEFT VENTRICLE PLAX 2D LVIDd:         5.00 cm     Diastology LVIDs:         4.60 cm     LV e' lateral:   7.18 cm/s LV PW:         0.90 cm     LV E/e' lateral: 15.3 LV IVS:        0.80 cm     LV e' medial:    6.96 cm/s LVOT diam:     2.30 cm     LV E/e' medial:  15.8 LV SV:         70 LV SV Index:   32 LVOT Area:     4.15 cm  LV Volumes (MOD) LV vol d, MOD A4C: 72.1 ml LV vol s, MOD A4C: 45.3 ml LV SV MOD A4C:     72.1 ml RIGHT VENTRICLE RV S prime:     8.92 cm/s LEFT ATRIUM             Index       RIGHT ATRIUM           Index LA diam:        4.40 cm 1.99 cm/m  RA Area:     15.20 cm LA Vol (A2C):   82.4 ml 37.28 ml/m RA Volume:   35.90 ml  16.24 ml/m LA Vol (A4C):   89.5 ml 40.49 ml/m LA Biplane Vol: 85.8 ml 38.82 ml/m  AORTIC VALVE AV Area (Vmax):    2.12 cm AV Area (Vmean):   1.94 cm AV Area (VTI):     2.05 cm AV Vmax:           166.00 cm/s  AV Vmean:          114.000 cm/s AV VTI:            0.344 m AV Peak Grad:      11.0 mmHg AV Mean Grad:      6.0 mmHg LVOT Vmax:         84.55 cm/s LVOT Vmean:        53.200 cm/s LVOT VTI:          0.170 m LVOT/AV VTI ratio: 0.49  AORTA Ao Root diam: 3.20 cm MITRAL VALVE MV Area (PHT): 2.76 cm     SHUNTS MV Decel Time: 275 msec     Systemic VTI:  0.17 m MV E velocity: 110.00 cm/s  Systemic Diam: 2.30 cm MV A velocity: 87.80 cm/s MV E/A ratio:  1.25 Cherlynn Kaiser MD Electronically signed by Cherlynn Kaiser MD Signature Date/Time: 09/07/2019/3:31:58 PM    Final      Medical Consultants:    None.  Anti-Infectives:   Cefepime and Flagyl.  Subjective:     [name] no further abdominal pain, he relate he is passing gas.  Objective:    Vitals:   09/09/19 0229 09/09/19 0339 09/09/19 0738 09/09/19 1125  BP:  135/81 (!) 150/63 (!) 142/63  Pulse:  62 65 66  Resp:  _0 Temp:  98.1 F (36.7 C) 98.1 F (36.7 C) 98.2 F (36.8 C)  TempSrc:  Oral Oral Oral  SpO2:  97% 97% 96%  Weight: 94.1 kg     Height:       SpO2: 96 %   Intake/Output Summary (Last 24 hours) at 09/09/2019 1127 Last data filed at 09/09/2019 1013 Gross per 24 hour  Intake 110 ml  Output 1575 ml  Net -1465 ml   Filed Weights   09/07/19 1736 09/08/19 0428 09/09/19 0229  Weight: 95.2 kg 96.2 kg 94.1 kg    Exam: General exam: In no acute distress. Respiratory system: Good air movement and clear to auscultation. Cardiovascular system: S1 & S2 heard, RRR.  Positive hepatojugular reflux Gastrointestinal system: Abdomen is nondistended, soft and nontender.  Extremities: No pedal edema. Skin: No rashes, lesions or ulcers Psychiatry: Judgement and insight appear normal. Mood & affect appropriate.    Data Reviewed:    Labs: Basic Metabolic Panel: Recent Labs  Lab 09/04/19 0411 09/04/19 0411 09/05/19 0104 09/05/19 0104 09/06/19 0960 09/06/19 4540 09/07/19 9811 09/07/19 9147 09/08/19 0351 09/09/19 0718  NA 130*   < > 132*  --  133*  --  135  --  138 138  K 4.4   < > 4.3   < > 3.7   < > 4.1   < > 4.1 3.9  CL 103   < > 104  --  104  --  105  --  105 103  CO2 19*   < > 18*  --  22  --  20*  --  23 21*  GLUCOSE 124*   < > 133*  --  147*  --  126*  --  126* 114*  BUN 37*   < > 31*  --  31*  --  27*  --  31* 37*  CREATININE 2.22*   < > 1.96*  --  1.79*  --  1.67*  --  1.87* 1.95*  CALCIUM 7.8*   < > 8.0*  --  7.7*  --  7.8*  --  8.0* 8.0*  MG 1.8  --   --   --  1.9  --  2.0  --   --  2.0   < > = values in this interval not displayed.   GFR Estimated Creatinine Clearance: 33.6 mL/min (A) (by C-G formula based on SCr of 1.95 mg/dL (H)). Liver  Function Tests: Recent Labs  Lab 09/05/19 0104 09/06/19 0545 09/07/19 0328 09/08/19 0351  AST _0 ALT _1 ALKPHOS 36* 38 39 36*  BILITOT 0.5 0.7 0.9 0.7  PROT 5.0* 4.7* 5.0* 4.9*  ALBUMIN 2.0* 1.8* 1.9* 1.9*   No results for input(s): LIPASE, AMYLASE in the last 168 hours. No results for input(s): AMMONIA in the last 168 hours. Coagulation profile No results for input(s): INR, PROTIME in the last 168 hours. COVID-19 Labs  No results for input(s): DDIMER, FERRITIN, LDH, CRP in the last 72 hours.  Lab Results  Component Value Date   [place] NEGATIVE 09/02/2019   SARSCOV2NAA NOT DETECTED 11/29/2018    CBC: Recent Labs  Lab 09/05/19 0827 09/06/19 0545 09/07/19 0328 09/08/19 0351 09/09/19 0718  WBC 12.8* 9.4 12.9* 12.0* 12.5*  NEUTROABS 9.8* 6.1 8.5* 7.3 7.9*  HGB 11.8* 11.6* 12.4* 11.8* 11.9*  HCT 35.8* 35.3* 37.9* 36.9* 37.1*  MCV 90.9 91.5 91.1 91.8 92.8  PLT 251 264 311 336 366   Cardiac Enzymes: No results for input(s): CKTOTAL, CKMB, CKMBINDEX, TROPONINI in the last 168 hours. BNP (last 3 results) No results for input(s): PROBNP in the last 8760 hours. CBG: Recent Labs  Lab 09/03/19 0545 09/03/19 0834 09/04/19 2037 09/05/19 0000 09/05/19 0428  GLUCAP 93 85 105* 106* 129*   D-Dimer: No results for input(s): DDIMER in the last 72 hours. Hgb A1c: No results for input(s): HGBA1C in the last 72 hours. Lipid Profile: No results for input(s): CHOL, HDL, LDLCALC, TRIG, CHOLHDL, LDLDIRECT in the last 72 hours. Thyroid function studies: No results for input(s): TSH, T4TOTAL, T3FREE, THYROIDAB in the last 72 hours.  Invalid input(s): FREET3 Anemia work up: No results for input(s): VITAMINB12, FOLATE, FERRITIN, TIBC, IRON, RETICCTPCT in the last 72 hours. Sepsis Labs: Recent Labs  Lab 09/02/19 1154 09/02/19 1154 09/02/19 1652 09/03/19 0344 09/05/19 0827 09/05/19 1143 09/05/19 1502 09/06/19 0545 09/07/19 0328 09/07/19 0824  09/08/19 0351 09/09/19 0718  PROCALCITON 3.38  --   --   --   --   --   --   --   --   --   --   --   WBC  --   --   --    < >   < >  --   --  9.4 12.9*  --  12.0* 12.5*  LATICACIDVEN 1.9   < > 1.8  --   --  1.2 1.0  --   --  1.3  --   --    < > = values in this interval not displayed.   Microbiology Recent Results (from the past 240 hour(s))  Blood Culture (routine x 2)     Status: None   Collection Time: 09/02/19  8:10 AM   Specimen: BLOOD RIGHT ARM  Result Value Ref Range Status   Specimen Description BLOOD RIGHT ARM  Final   Special Requests   Final    BOTTLES DRAWN AEROBIC AND ANAEROBIC Blood Culture adequate volume   Culture   Final    NO GROWTH 5 DAYS Performed at [facility], [address] [place], [place] [address]    Report Status 09/07/2019 FINAL  Final  Blood Culture (routine x 2)     Status: Abnormal   Collection Time: 09/02/19  8:10 AM   Specimen: BLOOD  Result Value Ref Range Status   Specimen Description BLOOD RIGHT ANTECUBITAL  Final   Special Requests   Final    BOTTLES DRAWN AEROBIC AND ANAEROBIC Blood Culture results may not be optimal due to an excessive volume of blood received in culture bottles   Culture  Setup Time   Final    GRAM POSITIVE RODS ANAEROBIC BOTTLE ONLY CRITICAL RESULT CALLED TO, READ BACK BY AND VERIFIED WITH: PHARMD Webber [phone] [id number] FCP    Culture (A)  Final    DIPHTHEROIDS(CORYNEBACTERIUM SPECIES) Standardized susceptibility testing for this organism is not available. Performed at [facility], [place] [address] [place], [place] [address]    Report Status 09/05/2019 FINAL  Final  SARS Coronavirus 2 by RT PCR (hospital order, performed in [facility] hospital lab) Nasopharyngeal Nasopharyngeal Swab     Status: None   Collection Time: 09/02/19  8:12 AM   Specimen: Nasopharyngeal Swab  Result Value Ref Range Status   SARS Coronavirus 2 NEGATIVE NEGATIVE Final    Comment: (NOTE) SARS-CoV-2 target nucleic acids are  NOT DETECTED. The SARS-CoV-2 RNA is generally detectable in upper and lower respiratory specimens during the acute phase of infection. The lowest concentration of SARS-CoV-2 viral copies this assay can detect is 250 copies / mL. A negative result does not preclude SARS-CoV-2 infection and should not be used as the sole basis for treatment or other patient management decisions.  A negative result may occur with improper specimen collection / handling, submission of specimen other than nasopharyngeal swab, presence of viral mutation(s) within the areas targeted by this assay, and inadequate number of viral copies (<250 copies / mL). A negative result must be combined with clinical observations, patient history, and epidemiological information. Fact Sheet for Patients:   StrictlyIdeas.no Fact Sheet for Healthcare Providers: BankingDealers.co.za This test is not yet approved or cleared  by the [place] FDA and has been authorized for detection and/or diagnosis of SARS-CoV-2 by FDA under an Emergency Use Authorization (EUA).  This EUA will remain in effect (meaning this test can be used) for the duration of the COVID-19 declaration under Section 564(b)(1) of the Act, 21 U.S.C. section 360bbb-3(b)(1), unless the authorization is terminated or revoked sooner. Performed at [facility], [place] [address], [place], [place] [address]   Urine culture     Status: Abnormal   Collection Time: 09/02/19  9:45 AM   Specimen: In/Out Cath Urine  Result Value Ref Range Status   Specimen Description IN/OUT CATH URINE  Final   Special Requests   Final    NONE Performed at [facility], [place] [address] [place], [place] [address]    Culture MULTIPLE SPECIES PRESENT, SUGGEST RECOLLECTION (A)  Final   Report Status 09/03/2019 FINAL  Final     Medications:   . apixaban  2.5 mg Oral BID  . atorvastatin  20 mg Oral QPM  . carvedilol  25 mg  Oral BID WC  . docusate sodium  100 mg Oral BID  . famotidine  40 mg Oral QPM  . hydrALAZINE  12.5 mg Oral BID AC  . isosorbide mononitrate  30 mg Oral QPM  . mexiletine  300 mg Oral BID  . pantoprazole  40 mg Oral Daily  . sodium chloride flush  3 mL Intravenous Q12H  .  sotalol  80 mg Oral BID   Continuous Infusions: . ceFEPime (MAXIPIME) IV 2 g (09/09/19 0914)  . lactated ringers 75 mL/hr at 09/09/19 0958  . metronidazole 500 mg (09/09/19 2334)      LOS: 7 days   Charlynne Cousins  [facility]  09/09/2019, 11:27 AM

## 2019-09-09 NOTE — Progress Notes (Signed)
Orienting Aimee, RN. The chart is accurate to the best of my knowledge.

## 2019-09-09 NOTE — Progress Notes (Signed)
Inpatient Rehabilitation-Admissions Coordinator   Followed up with pt this afternoon after working with PT. Noted recommendations have changed from CIR to [facility]. Pt states he feels much more stable on his feet and feels he will be able to return home without need for CIR program once medically ready. AC will follow along at a distance to ensure pt continues to progress with his mobility and ADLs prior to signing off completely.   AC will notify TOC of pt's preference at this time.   Raechel Ache, OTR/L  Rehab Admissions Coordinator  [phone] 09/09/2019 3:16 PM

## 2019-09-09 NOTE — Progress Notes (Signed)
Pharmacy Antibiotic Note  [name] is a 86 y.o. male admitted on 09/01/2019 with intra-abdominal infection.  Pharmacy  consulted 09/02/19 for Cefepime dosing. Metronidazole added 5/26.  WBC at 12.5. Afebrile. CKD stage IV: Scr 1.87>1.95 (Baseline 2.3).  Plan treat with antibiotics for 10 day  Plan: Continue Cefepime 2 gm IV Q 12 hours thru 09/11/19. -Monitor CBC, renal fx, cultures and clinical progress   Height: 6' (182.9 cm) Weight: 94.1 kg (207 lb 7.3 oz) IBW/kg (Calculated) : 77.6  Temp (24hrs), Avg:98.1 F (36.7 C), Min:97.5 F (36.4 C), Max:98.5 F (36.9 C)  Recent Labs  Lab 09/02/19 1652 09/03/19 0344 09/04/19 0411 09/05/19 0104 09/05/19 0827 09/05/19 1143 09/05/19 1502 09/06/19 0545 09/07/19 0328 09/07/19 0824 09/08/19 0351 09/09/19 0718  WBC  --    < >   < >  --  12.8*  --   --  9.4 12.9*  --  12.0* 12.5*  CREATININE  --    < >  --  1.96*  --   --   --  1.79* 1.67*  --  1.87* 1.95*  LATICACIDVEN 1.8  --   --   --   --  1.2 1.0  --   --  1.3  --   --    < > = values in this interval not displayed.    Estimated Creatinine Clearance: 33.6 mL/min (A) (by C-G formula based on SCr of 1.95 mg/dL (H)).    Allergies  Allergen Reactions  . Novocain [Procaine] Swelling    SWELLING REACTION UNSPECIFIED   . Amiodarone Nausea And Vomiting    Antimicrobials this admission: Cefepime 5/26 >> 6/4 Metronidazole 5/26>>6/4  Dose adjustments this admission: Cefepime 2gm q24 to 2gm IV q12h   Microbiology results: 5/26 BCx: (anaerobic): (diphtheroids/corynebacterium)  5/26 UCx:  Multiple species   Nicole Cella, [place] [phone] Please utilize Amion for appropriate phone number to reach the unit pharmacist ([place]) 09/09/2019 12:15 PM

## 2019-09-09 NOTE — Progress Notes (Signed)
Progress Note  Patient Name: [name] Date of Encounter: 09/09/2019  Primary Cardiologist: [name], MD   Subjective   No chest pain or palpitations, abdomen feeling improved.  Inpatient Medications    Scheduled Meds: . apixaban  2.5 mg Oral BID  . atorvastatin  20 mg Oral QPM  . carvedilol  25 mg Oral BID WC  . docusate sodium  100 mg Oral BID  . famotidine  40 mg Oral QPM  . hydrALAZINE  12.5 mg Oral BID AC  . isosorbide mononitrate  30 mg Oral QPM  . mexiletine  300 mg Oral BID  . pantoprazole  40 mg Oral Daily  . sodium chloride flush  3 mL Intravenous Q12H  . sotalol  80 mg Oral BID   Continuous Infusions: . ceFEPime (MAXIPIME) IV 2 g (09/08/19 2131)  . lactated ringers 75 mL/hr at 09/08/19 2016  . metronidazole 500 mg (09/09/19 0623)   PRN Meds: acetaminophen **OR** acetaminophen, albuterol, bisacodyl, diphenhydrAMINE, hydrALAZINE, HYDROcodone-acetaminophen, morphine injection, ondansetron **OR** ondansetron (ZOFRAN) IV, phenol, polyethylene glycol, zolpidem   Vital Signs    Vitals:   09/09/19 0037 09/09/19 0229 09/09/19 0339 09/09/19 0738  BP: (!) 122/58  135/81 (!) 150/63  Pulse: 67  62 65  Resp: _0 Temp: (!) 97.5 F (36.4 C)  98.1 F (36.7 C) 98.1 F (36.7 C)  TempSrc: Oral  Oral Oral  SpO2: 96%  97% 97%  Weight:  94.1 kg    Height:        Intake/Output Summary (Last 24 hours) at 09/09/2019 1749 Last data filed at 09/09/2019 0740 Gross per 24 hour  Intake 100 ml  Output 1900 ml  Net -1800 ml   Last 3 Weights 09/09/2019 09/08/2019 09/07/2019  Weight (lbs) 207 lb 7.3 oz 212 lb 1.3 oz 209 lb 14.1 oz  Weight (kg) 94.1 kg 96.2 kg 95.2 kg      Telemetry    Sinus rhythm with ventricular pacing, 26 beats of nonsustained VT  ECG    None new  Physical Exam   GEN: Well nourished, well developed, in no acute distress  HEENT: normal  Neck: JVD to the angle of the jaw when laying at 30 degrees, carotid bruits, or masses Cardiac: RRR;  no murmurs, rubs, or gallops,no edema  Respiratory:  clear to auscultation bilaterally, normal work of breathing GI: soft, nontender, nondistended, + BS MS: no deformity or atrophy  Skin: warm and dry, device site well healed Neuro:  Strength and sensation are intact Psych: euthymic mood, full affect    Labs    High Sensitivity Troponin:  No results for input(s): TROPONINIHS in the last 720 hours.    Chemistry Recent Labs  Lab 09/06/19 0545 09/06/19 0545 09/07/19 0328 09/08/19 0351 09/09/19 0718  NA 133*   < > 135 138 138  K 3.7   < > 4.1 4.1 3.9  CL 104   < > 105 105 103  CO2 22   < > 20* 23 21*  GLUCOSE 147*   < > 126* 126* 114*  BUN 31*   < > 27* 31* 37*  CREATININE 1.79*   < > 1.67* 1.87* 1.95*  CALCIUM 7.7*   < > 7.8* 8.0* 8.0*  PROT 4.7*  --  5.0* 4.9*  --   ALBUMIN 1.8*  --  1.9* 1.9*  --   AST 27  --  27 28  --   ALT 23  --  25  25  --   ALKPHOS 38  --  39 36*  --   BILITOT 0.7  --  0.9 0.7  --   GFRNONAA 34*   < > 37* 32* 31*  GFRAA 39*   < > 43* 37* 36*  ANIONGAP 7   < > _0 < > = values in this interval not displayed.     Hematology Recent Labs  Lab 09/07/19 0328 09/08/19 0351 09/09/19 0718  WBC 12.9* 12.0* 12.5*  RBC 4.16* 4.02* 4.00*  HGB 12.4* 11.8* 11.9*  HCT 37.9* 36.9* 37.1*  MCV 91.1 91.8 92.8  MCH 29.8 29.4 29.8  MCHC 32.7 32.0 32.1  RDW 14.7 15.0 15.1  PLT 311 336 366    BNPNo results for input(s): BNP, PROBNP in the last 168 hours.   DDimer No results for input(s): DDIMER in the last 168 hours.   Radiology    DG Abd 1 View Result Date: 09/07/2019 CLINICAL DATA:  Ileus EXAM: ABDOMEN - 1 VIEW COMPARISON:  Sep 05, 2019 FINDINGS: Nasogastric tube tip is at the gastroesophageal junction. There remain loops of dilated small bowel with contrast seen in the colon. No free air evident. Lung bases clear. IMPRESSION: Nasogastric tube tip at gastroesophageal junction. Advise advancing nasogastric tube 5-6 cm. Persistent small bowel  dilatation, likely due to ileus. A degree of partial bowel obstruction cannot be excluded. Note that there is air in the rectum, and contrast is seen in the colon. No appreciable free air. These results Daaron Dimarco be called to the ordering clinician or representative by the Radiologist Assistant, and communication documented in the PACS or [employer]. Electronically Signed   By: Lowella Grip III M.D.   On: 09/07/2019 08:50      Cardiac Studies   09/07/2019: TTE IMPRESSIONS   1. Left ventricular ejection fraction, by estimation, is 30 to 35%. The  left ventricle has moderately decreased function. Left ventricular  endocardial border not optimally defined to evaluate regional wall motion.  Left ventricular diastolic parameters  are consistent with Grade II diastolic dysfunction (pseudonormalization).  Elevated left ventricular end-diastolic pressure.   2. Right ventricular systolic function is moderately reduced. The right  ventricular size is normal. Tricuspid regurgitation signal is inadequate  for assessing PA pressure.   3. Left atrial size was mildly dilated.   4. The mitral valve is abnormal. Trivial mitral valve regurgitation. No  evidence of mitral stenosis.   5. The aortic valve has been repaired/replaced. Aortic valve  regurgitation is not visualized. There is a 23 mm Magna Ease pericardial  valve present in the aortic position. Procedure Date: 03/10/2008. Echo  findings are consistent with normal structure and   function of the aortic valve prosthesis. Aortic valve mean gradient  measures 6.0 mmHg.    05/27/2017: LVEF 20-25%    05/31/2017: R/LHC  Prox LAD to Mid LAD lesion is 50% stenosed.  Mid LAD to Dist LAD lesion is 25% stenosed.  1st Mrg lesion is 50% stenosed.  Prox RCA to Dist RCA lesion is 100% stenosed.  And is large.  Prox Graft to Mid Graft lesion is 30% stenosed.  And is large.  The flow in the graft is reversed.  There is competitive  flow.  And is large.  There is competitive flow.  Ost 1st Diag to 1st Diag lesion is 90% stenosed.   Findings:   Ao = 145/67 (99)  RA = 6 RV = 70/9 PA = 69/30 (46) PCW =  22 v = 37 Fick cardiac output/index =4.7/2.3 PVR = 4.3 FA sat = 98% PA sat = 63%, 60%   Assessment:   1. 3v CAD with stable revascularization with patent LIMA-LAD, SVG-diagonal and SVG-RCA 2. 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 Normal cardiac output - no evidence of thyrotoxicosis heart disease or PH    Patient Profile     86 y.o. male male with a hx of atrial fibrillation coronary artery disease status post CABG x3 with AVR, CKD stage IV, chronic systolic and diastolic heart failure with an ejection fraction of 25 to 30%, VT, PAFib  Admitted to [facility] with severe diverticulitis placed on IV fluids and he improved. He was worsened when he started a liquid diet and was found to have an ileus. An NG tube was placed (remains)  Developed VT (below his detection)   Device information MDT CRT-D, implanted1/13/17 + hx of appropriate therapy and VT AAD Hx: Amiodarone stopped Jan 2019, 2/2 SOB,  No clear finding of amio tox >> mexiletine  Assessment & Plan    1. VT: Currently on sotalol and mexiletine.  He has had further episodes of ventricular tachycardia, though this has been nonsustained.  At this point would continue his sotalol and mexiletine at the current dosage.  It is certainly possible that his ventricular tachycardia contributed to by his volume overload and overall GI issues with infection.  I imagine that his VT Althia Egolf improve with diuresis.  If further VT occurs, would increase sotalol to 120 mg.     2. Acute/chronic CHF (systolic): Continues to have a JVD.  Is currently on lactated Ringer's as he is NPO.  He Jacquie Lukes need continued diuresis once he is taking food and drink p.o.     3. H/o PAF: Currently on Eliquis.  No atrial fibrillation  noted.    For questions or updates, please contact [place] Please consult [email or web address] for contact info under        Signed, Dontavia Brand Meredith Leeds, MD  09/09/2019, 8:32 AM

## 2019-09-10 LAB — CBC WITH DIFFERENTIAL/PLATELET
Abs Immature Granulocytes: 0.47 10*3/uL — ABNORMAL HIGH (ref 0.00–0.07)
Basophils Absolute: 0.1 10*3/uL (ref 0.0–0.1)
Basophils Relative: 1 %
Eosinophils Absolute: 0.3 10*3/uL (ref 0.0–0.5)
Eosinophils Relative: 2 %
HCT: 36 % — ABNORMAL LOW (ref 39.0–52.0)
Hemoglobin: 11.6 g/dL — ABNORMAL LOW (ref 13.0–17.0)
Immature Granulocytes: 4 %
Lymphocytes Relative: 16 %
Lymphs Abs: 2.1 10*3/uL (ref 0.7–4.0)
MCH: 30.2 pg (ref 26.0–34.0)
MCHC: 32.2 g/dL (ref 30.0–36.0)
MCV: 93.8 fL (ref 80.0–100.0)
Monocytes Absolute: 1.8 10*3/uL — ABNORMAL HIGH (ref 0.1–1.0)
Monocytes Relative: 14 %
Neutro Abs: 8.5 10*3/uL — ABNORMAL HIGH (ref 1.7–7.7)
Neutrophils Relative %: 63 %
Platelets: 382 10*3/uL (ref 150–400)
RBC: 3.84 MIL/uL — ABNORMAL LOW (ref 4.22–5.81)
RDW: 15.2 % (ref 11.5–15.5)
WBC: 13.4 10*3/uL — ABNORMAL HIGH (ref 4.0–10.5)
nRBC: 0 % (ref 0.0–0.2)

## 2019-09-10 MED ORDER — DOCUSATE SODIUM 283 MG RE ENEM
1.0000 | ENEMA | Freq: Once | RECTAL | Status: AC
  Administered 2019-09-10: 283 mg via RECTAL
  Filled 2019-09-10: qty 1

## 2019-09-10 MED ORDER — FUROSEMIDE 10 MG/ML IJ SOLN
40.0000 mg | Freq: Every day | INTRAMUSCULAR | Status: DC
  Administered 2019-09-10: 40 mg via INTRAVENOUS
  Filled 2019-09-10: qty 4

## 2019-09-10 NOTE — Progress Notes (Signed)
[facility] PROGRESS NOTE    Progress Note  [name]  WJX:[id number] DOB: 05-15-1932 DOA: 09/01/2019 PCP: [name], MD     Brief Narrative:   [name] is an 86 y.o. male admitted to the hospital with acute diverticulitis and nonsustained V. tach and ileus.  NG tube was placed and he has been treated with conservative management.  His NG tube was clamped on 09/08/2019 he was only putting about 500 cc.  X-ray was obtained that showed dilated loops.  Assessment/Plan:   Acute severe diverticulitis/ileus: He was started empirically on IV cefepime and Flagyl. Abdominal x-ray on 09/09/2019 showed the contrast within the colon, the patient is passing a lot of gas.  Tolerated ice chips with the NG tube clamped. We will discontinue NG tube starting clear liquid diet, will give him a Dulcolax enema as he feels he has the urge to go but only passes gas. He will need 10 to 14 days of IV empiric antibiotics is on day 9.  Chronic combined systolic and diastolic heart failure: He is about 2 L positive he is now taking clear liquid diet we will go ahead and start on IV Lasix daily. Follow strict I's and O's daily weights. Daily standing weights.  Going back to the chart his weight usually range around 93 kg.  Chronic hyponatremia and metabolic acidosis: Resolved this morning.  Chronic kidney disease stage IV: Creatinine remained at baseline. With a baseline creatinine of 1.9-2.3.  Chronic atrial fibrillation: Rate controlled Coreg continue Eliquis. Chads Vasc score greater than 2.  Urinary retention: Appears to be resolved.     DVT prophylaxis: Eliquis Family Communication:none Status is: Inpatient  Remains inpatient appropriate because:Hemodynamically unstable and IV treatments appropriate due to intensity of illness or inability to take PO   Dispo: The patient is from: Home              Anticipated d/c is to: SNF              Anticipated d/c date is: 2  days              Patient currently is not medically stable to d/c.         Code Status:     Code Status Orders  (From admission, onward)         Start     Ordered   09/02/19 1119  Do not attempt resuscitation (DNR)  Continuous    Question Answer Comment  In the event of cardiac or respiratory ARREST Do not call a "code blue"   In the event of cardiac or respiratory ARREST Do not perform Intubation, CPR, defibrillation or ACLS   In the event of cardiac or respiratory ARREST Use medication by any route, position, wound care, and other measures to relive pain and suffering. May use oxygen, suction and manual treatment of airway obstruction as needed for comfort.      09/02/19 1121        Code Status History    Date Active Date Inactive Code Status Order ID Comments User Context   11/29/2018 1455 12/01/2018 2053 Full Code [id number]  Albertine Patricia, MD ED   06/15/2017 2214 06/20/2017 1940 Full Code [id number]  Marcie Mowers, MD ED   05/26/2017 1733 06/04/2017 2006 Full Code [id number]  Vashti Hey, MD ED   05/08/2017 1516 05/10/2017 1808 Full Code [id number]  Brenton Grills, PA-C ED   06/16/2015 1305 06/19/2015 1646 Full Code [id number]  Elgergawy, Silver Huguenin, MD ED   04/30/2015 2256 05/02/2015 1852 Full Code [id number]  Kinnie Feil, MD Inpatient   04/22/2015 1504 04/23/2015 1457 Full Code [id number]  Constance Haw, MD Inpatient   11/04/2014 0853 11/04/2014 1454 Full Code [id number]  Leonie Man, MD Inpatient   08/23/2014 0117 08/26/2014 1646 Full Code [id number]  Rise Patience, MD Inpatient   Advance Care Planning Activity        IV Access:    Peripheral IV   Procedures and diagnostic studies:   DG Abd Portable 1V  Result Date: 09/08/2019 CLINICAL DATA:  Ileus. EXAM: PORTABLE ABDOMEN - 1 VIEW COMPARISON:  09/07/2019. FINDINGS: AICD noted in good anatomic position. NG tube noted with tip at the gastroesophageal junction. Advancement of  approximately 10 cm should be considered. Distended loops of small bowel again noted without interim change. Colonic gas pattern is stable. Oral contrast in the colon. IMPRESSION: NG tube noted with tip at the gastroesophageal junction. Advancement of approximately 10 cm suggested. 2. Persistent distended loops of small bowel. Colon is nondistended. Oral contrast in the colon. Continued follow-up exams to demonstrate resolution of bowel distention suggested. Electronically Signed   By: Marcello Moores  Register   On: 09/08/2019 14:12     Medical Consultants:    None.  Anti-Infectives:   Cefepime and Flagyl.  Subjective:    [name] no further abdominal pain passing a lot of gas this morning, he felt this morning he had the urge to had a bowel movement but was unsuccessful.  Objective:    Vitals:   09/10/19 0206 09/10/19 0355 09/10/19 0744 09/10/19 0806  BP:  (!) 146/58 (!) 156/53 (!) 155/66  Pulse:  60 62 62  Resp:  _0 Temp:  98.2 F (36.8 C) 98.7 F (37.1 C) 98.2 F (36.8 C)  TempSrc:  Oral Oral Oral  SpO2:  96% 97% 97%  Weight: 94 kg     Height:       SpO2: 97 % O2 Flow Rate (L/min): 0 L/min   Intake/Output Summary (Last 24 hours) at 09/10/2019 1021 Last data filed at 09/10/2019 0814 Gross per 24 hour  Intake 1195.38 ml  Output 1000 ml  Net 195.38 ml   Filed Weights   09/08/19 0428 09/09/19 0229 09/10/19 0206  Weight: 96.2 kg 94.1 kg 94 kg    Exam: General exam: In no acute distress. Respiratory system: Good air movement and clear to auscultation. Cardiovascular system: S1 & S2 heard, RRR. No JVD. Gastrointestinal system: Abdomen is nondistended, soft and nontender.  Extremities: No pedal edema. Skin: No rashes, lesions or ulcers Psychiatry: Judgement and insight appear normal.   Data Reviewed:    Labs: Basic Metabolic Panel: Recent Labs  Lab 09/04/19 0411 09/04/19 0411 09/05/19 0104 09/05/19 0104 09/06/19 1093 09/06/19 2355 09/07/19 7322  09/07/19 0328 09/08/19 0351 09/09/19 0718  NA 130*   < > 132*  --  133*  --  135  --  138 138  K 4.4   < > 4.3   < > 3.7   < > 4.1   < > 4.1 3.9  CL 103   < > 104  --  104  --  105  --  105 103  CO2 19*   < > 18*  --  22  --  20*  --  23 21*  GLUCOSE 124*   < > 133*  --  147*  --  126*  --  126* 114*  BUN 37*   < > 31*  --  31*  --  27*  --  31* 37*  CREATININE 2.22*   < > 1.96*  --  1.79*  --  1.67*  --  1.87* 1.95*  CALCIUM 7.8*   < > 8.0*  --  7.7*  --  7.8*  --  8.0* 8.0*  MG 1.8  --   --   --  1.9  --  2.0  --   --  2.0   < > = values in this interval not displayed.   GFR Estimated Creatinine Clearance: 33.6 mL/min (A) (by C-G formula based on SCr of 1.95 mg/dL (H)). Liver Function Tests: Recent Labs  Lab 09/05/19 0104 09/06/19 0545 09/07/19 0328 09/08/19 0351  AST _0 ALT _1 ALKPHOS 36* 38 39 36*  BILITOT 0.5 0.7 0.9 0.7  PROT 5.0* 4.7* 5.0* 4.9*  ALBUMIN 2.0* 1.8* 1.9* 1.9*   No results for input(s): LIPASE, AMYLASE in the last 168 hours. No results for input(s): AMMONIA in the last 168 hours. Coagulation profile No results for input(s): INR, PROTIME in the last 168 hours. COVID-19 Labs  No results for input(s): DDIMER, FERRITIN, LDH, CRP in the last 72 hours.  Lab Results  Component Value Date   [place] NEGATIVE 09/02/2019   SARSCOV2NAA NOT DETECTED 11/29/2018    CBC: Recent Labs  Lab 09/06/19 0545 09/07/19 0328 09/08/19 0351 09/09/19 0718 09/10/19 0344  WBC 9.4 12.9* 12.0* 12.5* 13.4*  NEUTROABS 6.1 8.5* 7.3 7.9* 8.5*  HGB 11.6* 12.4* 11.8* 11.9* 11.6*  HCT 35.3* 37.9* 36.9* 37.1* 36.0*  MCV 91.5 91.1 91.8 92.8 93.8  PLT 264 311 336 366 382   Cardiac Enzymes: No results for input(s): CKTOTAL, CKMB, CKMBINDEX, TROPONINI in the last 168 hours. BNP (last 3 results) No results for input(s): PROBNP in the last 8760 hours. CBG: Recent Labs  Lab 09/04/19 2037 09/05/19 0000 09/05/19 0428  GLUCAP 105* 106* 129*    D-Dimer: No results for input(s): DDIMER in the last 72 hours. Hgb A1c: No results for input(s): HGBA1C in the last 72 hours. Lipid Profile: No results for input(s): CHOL, HDL, LDLCALC, TRIG, CHOLHDL, LDLDIRECT in the last 72 hours. Thyroid function studies: No results for input(s): TSH, T4TOTAL, T3FREE, THYROIDAB in the last 72 hours.  Invalid input(s): FREET3 Anemia work up: No results for input(s): VITAMINB12, FOLATE, FERRITIN, TIBC, IRON, RETICCTPCT in the last 72 hours. Sepsis Labs: Recent Labs  Lab 09/05/19 1143 09/05/19 1502 09/06/19 0545 09/07/19 0328 09/07/19 0824 09/08/19 0351 09/09/19 0718 09/10/19 0344  WBC  --   --    < > 12.9*  --  12.0* 12.5* 13.4*  LATICACIDVEN 1.2 1.0  --   --  1.3  --   --   --    < > = values in this interval not displayed.   Microbiology Recent Results (from the past 240 hour(s))  Blood Culture (routine x 2)     Status: None   Collection Time: 09/02/19  8:10 AM   Specimen: BLOOD RIGHT ARM  Result Value Ref Range Status   Specimen Description BLOOD RIGHT ARM  Final   Special Requests   Final    BOTTLES DRAWN AEROBIC AND ANAEROBIC Blood Culture adequate volume   Culture   Final    NO GROWTH 5 DAYS Performed at [facility], [address] [place], [place] [address]    Report  Status 09/07/2019 FINAL  Final  Blood Culture (routine x 2)     Status: Abnormal   Collection Time: 09/02/19  8:10 AM   Specimen: BLOOD  Result Value Ref Range Status   Specimen Description BLOOD RIGHT ANTECUBITAL  Final   Special Requests   Final    BOTTLES DRAWN AEROBIC AND ANAEROBIC Blood Culture results may not be optimal due to an excessive volume of blood received in culture bottles   Culture  Setup Time   Final    GRAM POSITIVE RODS ANAEROBIC BOTTLE ONLY CRITICAL RESULT CALLED TO, READ BACK BY AND VERIFIED WITH: PHARMD Normangee [id number] FCP    Culture (A)  Final    DIPHTHEROIDS(CORYNEBACTERIUM SPECIES) Standardized susceptibility  testing for this organism is not available. Performed at [facility], [place] [address], [place], [place] [address]    Report Status 09/05/2019 FINAL  Final  SARS Coronavirus 2 by RT PCR (hospital order, performed in [name] hospital lab) Nasopharyngeal Nasopharyngeal Swab     Status: None   Collection Time: 09/02/19  8:12 AM   Specimen: Nasopharyngeal Swab  Result Value Ref Range Status   SARS Coronavirus 2 NEGATIVE NEGATIVE Final    Comment: (NOTE) SARS-CoV-2 target nucleic acids are NOT DETECTED. The SARS-CoV-2 RNA is generally detectable in upper and lower respiratory specimens during the acute phase of infection. The lowest concentration of SARS-CoV-2 viral copies this assay can detect is 250 copies / mL. A negative result does not preclude SARS-CoV-2 infection and should not be used as the sole basis for treatment or other patient management decisions.  A negative result may occur with improper specimen collection / handling, submission of specimen other than nasopharyngeal swab, presence of viral mutation(s) within the areas targeted by this assay, and inadequate number of viral copies (<250 copies / mL). A negative result must be combined with clinical observations, patient history, and epidemiological information. Fact Sheet for Patients:   StrictlyIdeas.no Fact Sheet for Healthcare Providers: BankingDealers.co.za This test is not yet approved or cleared  by the [place] FDA and has been authorized for detection and/or diagnosis of SARS-CoV-2 by FDA under an Emergency Use Authorization (EUA).  This EUA will remain in effect (meaning this test can be used) for the duration of the COVID-19 declaration under Section 564(b)(1) of the Act, 21 U.S.C. section 360bbb-3(b)(1), unless the authorization is terminated or revoked sooner. Performed at [facility], [place] [address] [place], [place] [address]   Urine  culture     Status: Abnormal   Collection Time: 09/02/19  9:45 AM   Specimen: In/Out Cath Urine  Result Value Ref Range Status   Specimen Description IN/OUT CATH URINE  Final   Special Requests   Final    NONE Performed at [facility], [place] [address], [place], [place] [address]    Culture MULTIPLE SPECIES PRESENT, SUGGEST RECOLLECTION (A)  Final   Report Status 09/03/2019 FINAL  Final     Medications:   . apixaban  2.5 mg Oral BID  . atorvastatin  20 mg Oral QPM  . carvedilol  25 mg Oral BID WC  . docusate sodium  100 mg Oral BID  . famotidine  40 mg Oral QPM  . hydrALAZINE  12.5 mg Oral BID AC  . isosorbide mononitrate  30 mg Oral QPM  . mexiletine  300 mg Oral BID  . pantoprazole  40 mg Oral Daily  . sodium chloride flush  3 mL  Intravenous Q12H  . sotalol  80 mg Oral BID   Continuous Infusions: . ceFEPime (MAXIPIME) IV 2 g (09/10/19 0951)  . metronidazole 500 mg (09/10/19 0626)      LOS: 8 days   Charlynne Cousins  [facility]  09/10/2019, 10:21 AM

## 2019-09-10 NOTE — Progress Notes (Signed)
Gaved rectal enema with Justin Mend, RN at the bedside. Patient tolerated well. Informed Alexis, NT about the procedure and will let the oncoming nurse know to monitor.

## 2019-09-10 NOTE — Progress Notes (Addendum)
Telemetry reviewed. SR/V paced One 7beat NSVT yesterday afternoon only.  No new recommendations from EP standpoint  Tommye Standard, PA-C

## 2019-09-10 NOTE — Progress Notes (Signed)
CCMD stated that the patient had a run of 5 beats of NSVT. Patient is currenly asleep with no distress noted. Paged Joseph Art, PA to notify. Will continue to monitor.

## 2019-09-10 NOTE — Progress Notes (Signed)
Patient delayed enema ordered in the AM, wants to eat more to see if he is able to have a BM naturally. Patient has no complaints of discomfort and says is having flatulence periodically through out the day. Inform the patient to let the current RN know if there is any discomfort to give the enema. Patient verbalizes understanding. Will continue to monitor.

## 2019-09-11 ENCOUNTER — Inpatient Hospital Stay (HOSPITAL_COMMUNITY): Payer: Medicare HMO

## 2019-09-11 DIAGNOSIS — I951 Orthostatic hypotension: Secondary | ICD-10-CM

## 2019-09-11 LAB — BASIC METABOLIC PANEL
Anion gap: 11 (ref 5–15)
BUN: 38 mg/dL — ABNORMAL HIGH (ref 8–23)
CO2: 21 mmol/L — ABNORMAL LOW (ref 22–32)
Calcium: 7.8 mg/dL — ABNORMAL LOW (ref 8.9–10.3)
Chloride: 105 mmol/L (ref 98–111)
Creatinine, Ser: 1.93 mg/dL — ABNORMAL HIGH (ref 0.61–1.24)
GFR calc Af Amer: 36 mL/min — ABNORMAL LOW (ref >60)
GFR calc non Af Amer: 31 mL/min — ABNORMAL LOW (ref >60)
Glucose, Bld: 91 mg/dL (ref 70–99)
Potassium: 4 mmol/L (ref 3.5–5.1)
Sodium: 137 mmol/L (ref 135–145)

## 2019-09-11 LAB — CBC WITH DIFFERENTIAL/PLATELET
Abs Immature Granulocytes: 0.32 10*3/uL — ABNORMAL HIGH (ref 0.00–0.07)
Basophils Absolute: 0.1 10*3/uL (ref 0.0–0.1)
Basophils Relative: 1 %
Eosinophils Absolute: 0.2 10*3/uL (ref 0.0–0.5)
Eosinophils Relative: 2 %
HCT: 35.9 % — ABNORMAL LOW (ref 39.0–52.0)
Hemoglobin: 11.4 g/dL — ABNORMAL LOW (ref 13.0–17.0)
Immature Granulocytes: 3 %
Lymphocytes Relative: 18 %
Lymphs Abs: 2.2 10*3/uL (ref 0.7–4.0)
MCH: 29.5 pg (ref 26.0–34.0)
MCHC: 31.8 g/dL (ref 30.0–36.0)
MCV: 92.8 fL (ref 80.0–100.0)
Monocytes Absolute: 1.7 10*3/uL — ABNORMAL HIGH (ref 0.1–1.0)
Monocytes Relative: 13 %
Neutro Abs: 8.1 10*3/uL — ABNORMAL HIGH (ref 1.7–7.7)
Neutrophils Relative %: 63 %
Platelets: 378 10*3/uL (ref 150–400)
RBC: 3.87 MIL/uL — ABNORMAL LOW (ref 4.22–5.81)
RDW: 15.2 % (ref 11.5–15.5)
WBC: 12.6 10*3/uL — ABNORMAL HIGH (ref 4.0–10.5)
nRBC: 0 % (ref 0.0–0.2)

## 2019-09-11 MED ORDER — HEPARIN (PORCINE) 25000 UT/250ML-% IV SOLN
1300.0000 [IU]/h | INTRAVENOUS | Status: DC
  Administered 2019-09-11: 1300 [IU]/h via INTRAVENOUS
  Filled 2019-09-11: qty 250

## 2019-09-11 MED ORDER — IOHEXOL 9 MG/ML PO SOLN: 500.0000 mL | ORAL | Status: AC

## 2019-09-11 MED ORDER — FUROSEMIDE 40 MG PO TABS: 40.0000 mg | ORAL_TABLET | Freq: Every day | ORAL | Status: DC

## 2019-09-11 MED ORDER — IOHEXOL 300 MG/ML  SOLN
80.0000 mL | Freq: Once | INTRAMUSCULAR | Status: AC | PRN
  Administered 2019-09-11: 80 mL via INTRAVENOUS

## 2019-09-11 MED ORDER — TRAMADOL HCL 50 MG PO TABS
50.0000 mg | ORAL_TABLET | Freq: Two times a day (BID) | ORAL | Status: DC | PRN
  Administered 2019-09-23: 50 mg via ORAL
  Filled 2019-09-11: qty 1

## 2019-09-11 MED ORDER — IOHEXOL 9 MG/ML PO SOLN
ORAL | Status: AC
  Filled 2019-09-11: qty 1000

## 2019-09-11 MED ORDER — FUROSEMIDE 10 MG/ML IJ SOLN: 40.0000 mg | Freq: Every day | INTRAMUSCULAR | Status: DC

## 2019-09-11 MED ORDER — MUSCLE RUB 10-15 % EX CREA
TOPICAL_CREAM | CUTANEOUS | Status: DC | PRN
  Administered 2019-09-17: 1 via TOPICAL
  Filled 2019-09-11 (×2): qty 85

## 2019-09-11 NOTE — Significant Event (Addendum)
Patient is dizzy from lying to sitting with a positive blood pressure drop from 156/59 to 136/70 to 92/76. Will notify MD.

## 2019-09-11 NOTE — Progress Notes (Signed)
Physical Therapy Treatment Patient Details Name: [name] MRN: [id number] DOB: 1932/06/04 Today's Date: 09/11/2019    History of Present Illness 86 yo male presented to ED 09/02/19 with onset of generalized weakness after a flare of his diverticulitis with metabolic acidosis, acute urinary retention.  No perf on CT, severe diverticulitis on non-contrast CT 09/02/19. Pt currently being monitored for recurring Vtach that started 5/29. PMHx: TAVR, a-fib, CKD4, CHF, AICD, HTN, CAD with CABG, diverticulitis.    PT Comments    Pt admitted with above diagnosis. Pt was unable to progress out of bed as he was orthostatic and only made it to EOB. Had to lie down due to dizziness. Will continue to follow and progress pt as able.  Pt currently with functional limitations due to balance and endurance deficits. Pt will benefit from skilled PT to increase their independence and safety with mobility to allow discharge to the venue listed below.    Orthostatic BPs  Supine 134/64, 60 bpm  Sitting 98/60, 76 bpm  Standing NT due to severe dizziness at EOB  Standing after 3 min NT    Follow Up Recommendations  Home health PT;Supervision/Assistance - 24 hour(may need to update if pt continues to have medical issues)     Equipment Recommendations  Other (comment)(TBD)    Recommendations for Other Services       Precautions / Restrictions Precautions Precautions: Fall;Other (comment) Restrictions Weight Bearing Restrictions: No    Mobility  Bed Mobility   Bed Mobility: Supine to Sit     Supine to sit: Min guard Sit to supine: Min guard   General bed mobility comments: Did not have to help pt come to EOB.   Transfers                 General transfer comment: Pt orthostatic supine to sit with dizziness reported. Supine BP 134/64 and sitting 98/60 and therefore could not stand pt as pt so dizzy he had to lie down.   Ambulation/Gait                 Stairs              Wheelchair Mobility    Modified Rankin (Stroke Patients Only)       Balance Overall balance assessment: Needs assistance Sitting-balance support: Feet supported;Bilateral upper extremity supported Sitting balance-Leahy Scale: Fair Sitting balance - Comments: Pt reported feeling dizzy at EOB                                    Cognition Arousal/Alertness: Awake/alert Behavior During Therapy: WFL for tasks assessed/performed Overall Cognitive Status: Within Functional Limits for tasks assessed                                        Exercises      General Comments        Pertinent Vitals/Pain      Home Living                      Prior Function            PT Goals (current goals can now be found in the care plan section) Acute Rehab PT Goals Patient Stated Goal: to walk and get home Progress towards PT goals: Not progressing toward  goals - comment(ortthostasis)    Frequency    Min 3X/week      PT Plan Current plan remains appropriate    Co-evaluation              AM-PAC PT "6 Clicks" Mobility   Outcome Measure  Help needed turning from your back to your side while in a flat bed without using bedrails?: A Little Help needed moving from lying on your back to sitting on the side of a flat bed without using bedrails?: A Little Help needed moving to and from a bed to a chair (including a wheelchair)?: A Little Help needed standing up from a chair using your arms (e.g., wheelchair or bedside chair)?: A Lot Help needed to walk in hospital room?: A Lot Help needed climbing 3-5 steps with a railing? : A Lot 6 Click Score: 15    End of Session   Activity Tolerance: Patient limited by fatigue;Other (comment)(limited by dizziness) Patient left: in bed;with bed alarm set;with call bell/phone within reach Nurse Communication: Mobility status PT Visit Diagnosis: Unsteadiness on feet (R26.81);Muscle weakness  (generalized) (M62.81);Difficulty in walking, not elsewhere classified (R26.2);Ataxic gait (R26.0);Pain Pain - Right/Left: (abdomen) Pain - part of body: (abdomen)     Time: 6381-7711 PT Time Calculation (min) (ACUTE ONLY): 13 min  Charges:  $Therapeutic Activity: 8-22 mins                     Emre Stock W,PT Acute Rehabilitation Services Pager:  [phone]  Office:  [place] 09/11/2019, 3:35 PM

## 2019-09-11 NOTE — Progress Notes (Addendum)
[facility] PROGRESS NOTE    Progress Note  [name]  WLN:[id number] DOB: [birth year] DOA: 09/01/2019 PCP: [name], MD     Brief Narrative:   [name] is an 86 y.o. male admitted to the hospital with acute diverticulitis and nonsustained V. tach and ileus.  NG tube was placed and he has been treated with conservative management.  His NG tube was clamped on 09/08/2019 he was only putting about 500 cc.  X-ray was obtained that showed dilated loops.  Assessment/Plan:   Acute severe diverticulitis/ileus: He was started empirically on IV cefepime and Flagyl. Abdominal x-ray on 09/09/2019 showed the contrast within the colon, continues to pass gas but no bowel movement. His abdominal exam is benign.  We will advance him to full liquid diet continues to have persistent leukocytosis. We will get a CT scan of the abdomen and pelvis to rule out obstruction and/or abscess  Chronic combined systolic and diastolic heart failure: He is about 2 L positive, changing to full liquid diet, continue Lasix once a day IV. Follow strict I's and O's daily weights. Daily standing weights.  He is at his dry weight at this point.  Chronic hyponatremia and metabolic acidosis: Resolved this morning.  Chronic kidney disease stage IV: Creatinine remained at baseline. With a baseline creatinine of 1.9-2.3.  Chronic atrial fibrillation: Rate controlled Coreg continue Eliquis. Chads Vasc score greater than 2.  Urinary retention: Appears to be resolved.  New orthostatic hypotension: Discontinue hydralazine he appears to be at his dry weight his blood pressure is stable. Resume lasix in am.     DVT prophylaxis: Eliquis Family Communication:none Status is: Inpatient  Remains inpatient appropriate because:Hemodynamically unstable and IV treatments appropriate due to intensity of illness or inability to take PO   Dispo: The patient is from: Home              Anticipated d/c is  to: SNF              Anticipated d/c date is: 2 days              Patient currently is not medically stable to d/c.   Code Status:     Code Status Orders  (From admission, onward)         Start     Ordered   09/02/19 1119  Do not attempt resuscitation (DNR)  Continuous    Question Answer Comment  In the event of cardiac or respiratory ARREST Do not call a "code blue"   In the event of cardiac or respiratory ARREST Do not perform Intubation, CPR, defibrillation or ACLS   In the event of cardiac or respiratory ARREST Use medication by any route, position, wound care, and other measures to relive pain and suffering. May use oxygen, suction and manual treatment of airway obstruction as needed for comfort.      09/02/19 1121        Code Status History    Date Active Date Inactive Code Status Order ID Comments User Context   11/29/2018 1455 12/01/2018 2053 Full Code [id number]  Albertine Patricia, MD ED   06/15/2017 2214 06/20/2017 1940 Full Code [id number]  Marcie Mowers, MD ED   05/26/2017 1733 06/04/2017 2006 Full Code [id number]  Vashti Hey, MD ED   05/08/2017 1516 05/10/2017 1808 Full Code [id number]  Brenton Grills, PA-C ED   06/16/2015 1305 06/19/2015 1646 Full Code [id number]  Elgergawy, Silver Huguenin, MD ED  04/30/2015 2256 05/02/2015 1852 Full Code [id number]  Kinnie Feil, MD Inpatient   04/22/2015 1504 04/23/2015 1457 Full Code [id number]  Constance Haw, MD Inpatient   11/04/2014 0853 11/04/2014 1454 Full Code [id number]  Leonie Man, MD Inpatient   08/23/2014 0117 08/26/2014 1646 Full Code [id number]  Rise Patience, MD Inpatient   Advance Care Planning Activity        IV Access:    Peripheral IV   Procedures and diagnostic studies:   No results found.   Medical Consultants:    None.  Anti-Infectives:   Cefepime and Flagyl.  Subjective:    [name] continues to pass flatus but no abdominal pain, he relates no bowel  movements either.  Objective:    Vitals:   09/11/19 0751 09/11/19 0754 09/11/19 0801 09/11/19 0804  BP: (!) 154/59 136/76 92/76 140/63  Pulse: 60 64  63  Resp: 18     Temp:      TempSrc:      SpO2: 96%   96%  Weight:      Height:       SpO2: 96 % O2 Flow Rate (L/min): 0 L/min   Intake/Output Summary (Last 24 hours) at 09/11/2019 0848 Last data filed at 09/11/2019 0625 Gross per 24 hour  Intake 480 ml  Output 2050 ml  Net -1570 ml   Filed Weights   09/09/19 0229 09/10/19 0206 09/11/19 0336  Weight: 94.1 kg 94 kg 93.8 kg    Exam: General exam: In no acute distress. Respiratory system: Good air movement and clear to auscultation. Cardiovascular system: S1 & S2 heard, RRR. No JVD. Gastrointestinal system: Positive bowel sounds soft nontender nondistended Extremities: No pedal edema. Skin: No rashes, lesions or ulcers Psychiatry: Judgement and insight appear normal.   Data Reviewed:    Labs: Basic Metabolic Panel: Recent Labs  Lab 09/06/19 0545 09/06/19 0545 09/07/19 0328 09/07/19 0328 09/08/19 0351 09/08/19 0351 09/09/19 0718 09/11/19 0359  NA 133*  --  135  --  138  --  138 137  K 3.7   < > 4.1   < > 4.1   < > 3.9 4.0  CL 104  --  105  --  105  --  103 105  CO2 22  --  20*  --  23  --  21* 21*  GLUCOSE 147*  --  126*  --  126*  --  114* 91  BUN 31*  --  27*  --  31*  --  37* 38*  CREATININE 1.79*  --  1.67*  --  1.87*  --  1.95* 1.93*  CALCIUM 7.7*  --  7.8*  --  8.0*  --  8.0* 7.8*  MG 1.9  --  2.0  --   --   --  2.0  --    < > = values in this interval not displayed.   GFR Estimated Creatinine Clearance: 33.9 mL/min (A) (by C-G formula based on SCr of 1.93 mg/dL (H)). Liver Function Tests: Recent Labs  Lab 09/05/19 0104 09/06/19 0545 09/07/19 0328 09/08/19 0351  AST _0 ALT _1 ALKPHOS 36* 38 39 36*  BILITOT 0.5 0.7 0.9 0.7  PROT 5.0* 4.7* 5.0* 4.9*  ALBUMIN 2.0* 1.8* 1.9* 1.9*   No results for input(s): LIPASE, AMYLASE  in the last 168 hours. No results for input(s): AMMONIA in the last 168 hours. Coagulation profile No results for input(s):  INR, PROTIME in the last 168 hours. COVID-19 Labs  No results for input(s): DDIMER, FERRITIN, LDH, CRP in the last 72 hours.  Lab Results  Component Value Date   [place] NEGATIVE 09/02/2019   SARSCOV2NAA NOT DETECTED 11/29/2018    CBC: Recent Labs  Lab 09/07/19 0328 09/08/19 0351 09/09/19 0718 09/10/19 0344 09/11/19 0359  WBC 12.9* 12.0* 12.5* 13.4* 12.6*  NEUTROABS 8.5* 7.3 7.9* 8.5* 8.1*  HGB 12.4* 11.8* 11.9* 11.6* 11.4*  HCT 37.9* 36.9* 37.1* 36.0* 35.9*  MCV 91.1 91.8 92.8 93.8 92.8  PLT 311 336 366 382 378   Cardiac Enzymes: No results for input(s): CKTOTAL, CKMB, CKMBINDEX, TROPONINI in the last 168 hours. BNP (last 3 results) No results for input(s): PROBNP in the last 8760 hours. CBG: Recent Labs  Lab 09/04/19 2037 09/05/19 0000 09/05/19 0428  GLUCAP 105* 106* 129*   D-Dimer: No results for input(s): DDIMER in the last 72 hours. Hgb A1c: No results for input(s): HGBA1C in the last 72 hours. Lipid Profile: No results for input(s): CHOL, HDL, LDLCALC, TRIG, CHOLHDL, LDLDIRECT in the last 72 hours. Thyroid function studies: No results for input(s): TSH, T4TOTAL, T3FREE, THYROIDAB in the last 72 hours.  Invalid input(s): FREET3 Anemia work up: No results for input(s): VITAMINB12, FOLATE, FERRITIN, TIBC, IRON, RETICCTPCT in the last 72 hours. Sepsis Labs: Recent Labs  Lab 09/05/19 1143 09/05/19 1502 09/06/19 0545 09/07/19 0328 09/07/19 0824 09/08/19 0351 09/09/19 0718 09/10/19 0344 09/11/19 0359  WBC  --   --    < >   < >  --  12.0* 12.5* 13.4* 12.6*  LATICACIDVEN 1.2 1.0  --   --  1.3  --   --   --   --    < > = values in this interval not displayed.   Microbiology Recent Results (from the past 240 hour(s))  Blood Culture (routine x 2)     Status: None   Collection Time: 09/02/19  8:10 AM   Specimen: BLOOD RIGHT  ARM  Result Value Ref Range Status   Specimen Description BLOOD RIGHT ARM  Final   Special Requests   Final    BOTTLES DRAWN AEROBIC AND ANAEROBIC Blood Culture adequate volume   Culture   Final    NO GROWTH 5 DAYS Performed at [facility], [address] [place], [place] [address]    Report Status 09/07/2019 FINAL  Final  Blood Culture (routine x 2)     Status: Abnormal   Collection Time: 09/02/19  8:10 AM   Specimen: BLOOD  Result Value Ref Range Status   Specimen Description BLOOD RIGHT ANTECUBITAL  Final   Special Requests   Final    BOTTLES DRAWN AEROBIC AND ANAEROBIC Blood Culture results may not be optimal due to an excessive volume of blood received in culture bottles   Culture  Setup Time   Final    GRAM POSITIVE RODS ANAEROBIC BOTTLE ONLY CRITICAL RESULT CALLED TO, READ BACK BY AND VERIFIED WITH: PHARMD [place] [id number] [id number] FCP    Culture (A)  Final    DIPHTHEROIDS(CORYNEBACTERIUM SPECIES) Standardized susceptibility testing for this organism is not available. Performed at [facility], [place] [address] [place], [place] [address]    Report Status 09/05/2019 FINAL  Final  SARS Coronavirus 2 by RT PCR (hospital order, performed in [facility] hospital lab) Nasopharyngeal Nasopharyngeal Swab     Status: None   Collection Time: 09/02/19  8:12 AM   Specimen: Nasopharyngeal  Swab  Result Value Ref Range Status   SARS Coronavirus 2 NEGATIVE NEGATIVE Final    Comment: (NOTE) SARS-CoV-2 target nucleic acids are NOT DETECTED. The SARS-CoV-2 RNA is generally detectable in upper and lower respiratory specimens during the acute phase of infection. The lowest concentration of SARS-CoV-2 viral copies this assay can detect is 250 copies / mL. A negative result does not preclude SARS-CoV-2 infection and should not be used as the sole basis for treatment or other patient management decisions.  A negative result may occur with improper specimen collection / handling,  submission of specimen other than nasopharyngeal swab, presence of viral mutation(s) within the areas targeted by this assay, and inadequate number of viral copies (<250 copies / mL). A negative result must be combined with clinical observations, patient history, and epidemiological information. Fact Sheet for Patients:   StrictlyIdeas.no Fact Sheet for Healthcare Providers: BankingDealers.co.za This test is not yet approved or cleared  by the [place] FDA and has been authorized for detection and/or diagnosis of SARS-CoV-2 by FDA under an Emergency Use Authorization (EUA).  This EUA will remain in effect (meaning this test can be used) for the duration of the COVID-19 declaration under Section 564(b)(1) of the Act, 21 U.S.C. section 360bbb-3(b)(1), unless the authorization is terminated or revoked sooner. Performed at [facility], [place] [address] [place], [place] [address]   Urine culture     Status: Abnormal   Collection Time: 09/02/19  9:45 AM   Specimen: In/Out Cath Urine  Result Value Ref Range Status   Specimen Description IN/OUT CATH URINE  Final   Special Requests   Final    NONE Performed at [facility], [place] [address], [place], [place] [address]    Culture MULTIPLE SPECIES PRESENT, SUGGEST RECOLLECTION (A)  Final   Report Status 09/03/2019 FINAL  Final     Medications:   . apixaban  2.5 mg Oral BID  . atorvastatin  20 mg Oral QPM  . carvedilol  25 mg Oral BID WC  . docusate sodium  100 mg Oral BID  . famotidine  40 mg Oral QPM  . furosemide  40 mg Intravenous Daily  . hydrALAZINE  12.5 mg Oral BID AC  . isosorbide mononitrate  30 mg Oral QPM  . mexiletine  300 mg Oral BID  . pantoprazole  40 mg Oral Daily  . sodium chloride flush  3 mL Intravenous Q12H  . sotalol  80 mg Oral BID   Continuous Infusions: . ceFEPime (MAXIPIME) IV 2 g (09/10/19 2056)  . metronidazole 500 mg (09/11/19 4932)       LOS: 9 days   Charlynne Cousins  [facility]  09/11/2019, 8:48 AM

## 2019-09-11 NOTE — Consult Note (Signed)
Surgical Evaluation  Chief Complaint: diverticulitis  HPI: 86 year old gentleman with multiple significant medical problems as listed below who has been admitted to the hospital since May 25 with diverticulitis and associated ileus. Admission CT demonstrates extensive inflammation but no evidence of perforation. This is his 4th episode of diverticulitis, his gastroenterologist is Dr. Michail Sermon.  He has been treated with IV antibiotics. He had an NG tube for several days but this has been removed.  Due to persistent mild leukocytosis and ileus, a repeat CT scan was completed this afternoon, now demonstrating a small pocket of extraluminal gas consistent with localized perforation, but resolution of the inflammatory phlegmon that was present on the initial study, and still no fluid collection or abscess. Persistent mild distention of the small bowel most consistent with ileus, no evidence of obstruction.  He currently reports no abdominal pain, no nausea or vomiting, and endorses a bowel movement earlier today. He does not have much of an appetite. He denies any prior abdominal surgery. His last dose of Eliquis was this morning at 930.   Allergies  Allergen Reactions  . Novocain [Procaine] Swelling    SWELLING REACTION UNSPECIFIED   . Amiodarone Nausea And Vomiting    Past Medical History:  Diagnosis Date  . Arthritis    "minor everywhere" (04/22/2015)  . Asthma    "a touch"  . Barrett esophagus   . CAD (coronary artery disease)    a. s/p 3 vvessel CABG with LIMA to LAD, SVG to diagonal 1, SVG to RCA 12/09.  . Cardiac resynchronization therapy defibrillator (CRT-D) in place   . Chronic anticoagulation 01/23/2013  . Chronic combined systolic and diastolic CHF (congestive heart failure) (HCC)    dry weight 197-200lbs.  . CKD (chronic kidney disease) stage 3, GFR 30-59 ml/min 08/23/2014  . DCM (dilated cardiomyopathy) ([place])    initially concerned for TTP amyloidosis but Tc-PYP scan 06/2017 was  not consistent with amyloid and SPEP/UPEP with no M spike. EF 20-25% by echo 2019  . Diverticulitis 08/2019  . Dyslipidemia 01/23/2013  . Essential hypertension 01/23/2013  . GERD (gastroesophageal reflux disease)   . GI bleed 05/08/2017  . H/O: rheumatic fever   . Hepatitis 1957   "don't know what kind:  . History of hiatal hernia   . History of PFTs    a. Amiodarone started 5/16 >> PFTs w/ DLCO 6/16:  FEV1 72% predicted, FEV1/FVC 66%, DLCO 66% >> minimal reversible obstructive airways disease with mild diffusion defect (suggestive of emphysema but absence of hyperinflation inconsistent with dx)  . Hyperkalemia 08/23/2014  . Hyperthyroidism 05/26/2017  . Leg weakness 01/28/2017  . Multiple thyroid nodules 08/13/2017  . Orthostatic hypotension   . Persistent atrial fibrillation ([place]) 01/23/2013  . Pleural effusion on right 05/26/2017  . Pneumonia 08/2014   "dr thought I may have had a touch"  . Pre-diabetes   . Protein-calorie malnutrition, severe 05/27/2017  . S/P AVR (aortic valve replacement) 2009   a. severe AS s/p AVR with pericardial tissue valve 2009.  Marland Kitchen Shingles 08/23/2014  . VT (ventricular tachycardia) ([place]) 05/06/2017    Past Surgical History:  Procedure Laterality Date  . AORTIC VALVE REPLACEMENT  with 23-mm Magna Ease pericardial valve, model number   with 23-mm Magna Ease pericardial valve, model [id number], serial number [id number]   . APPENDECTOMY  1940s  . BI-VENTRICULAR IMPLANTABLE CARDIOVERTER DEFIBRILLATOR  (CRT-D)  04/22/2015  . CARDIAC CATHETERIZATION N/A 11/04/2014   Procedure: Left Heart Cath and Cors/Grafts Angiography;  Surgeon: Leonie Green  Ellyn Hack, MD;  Location: [place] [facility];  Service: Cardiovascular;  Laterality: N/A;  . CARDIAC VALVE REPLACEMENT    . CARDIOVERSION N/A 01/27/2013   Procedure: CARDIOVERSION;  Surgeon: Sueanne Margarita, MD;  Location: [place];  Service: Cardiovascular;  Laterality: N/A;  . CARDIOVERSION N/A 08/25/2014   Procedure:  CARDIOVERSION;  Surgeon: Sanda Klein, MD;  Location: [place] [facility];  Service: Cardiovascular;  Laterality: N/A;  . CARDIOVERSION N/A 05/28/2018   Procedure: CARDIOVERSION;  Surgeon: Pixie Casino, MD;  Location: [facility];  Service: Cardiovascular;  Laterality: N/A;  . CATARACT EXTRACTION W/ INTRAOCULAR LENS  IMPLANT, BILATERAL Bilateral   . CHEST TUBE INSERTION Right 07/26/2017   Procedure: INSERTION PLEURAL DRAINAGE CATHETER - RIGHT;  Surgeon: Ivin Poot, MD;  Location: [place];  Service: Thoracic;  Laterality: Right;  . CORONARY ARTERY BYPASS GRAFT  03/10/2008   x 3 Dr. Roxan Hockey  . EP IMPLANTABLE DEVICE N/A 04/22/2015   Procedure: BiV ICD Insertion CRT-D;  Surgeon: [name], MD;  Location: [place] [facility];  Service: Cardiovascular;  Laterality: N/A;  . ESOPHAGOGASTRODUODENOSCOPY (EGD) WITH ESOPHAGEAL DILATION  X1  . ESOPHAGOGASTRODUODENOSCOPY (EGD) WITH PROPOFOL N/A 05/09/2017   Procedure: ESOPHAGOGASTRODUODENOSCOPY (EGD) WITH PROPOFOL;  Surgeon: Carol Ada, MD;  Location: [place];  Service: Endoscopy;  Laterality: N/A;  . IR THORACENTESIS ASP PLEURAL SPACE W/IMG GUIDE  05/27/2017  . IR THORACENTESIS ASP PLEURAL SPACE W/IMG GUIDE  06/17/2017  . IR THORACENTESIS ASP PLEURAL SPACE W/IMG GUIDE  06/18/2017  . IR THORACENTESIS ASP PLEURAL SPACE W/IMG GUIDE  07/11/2017  . REMOVAL OF PLEURAL DRAINAGE CATHETER Right 09/13/2017   Procedure: REMOVAL OF PLEURAL DRAINAGE CATHETER;  Surgeon: Ivin Poot, MD;  Location: [place];  Service: Thoracic;  Laterality: Right;  . RIGHT HEART CATH AND CORONARY/GRAFT ANGIOGRAPHY N/A 05/31/2017   Procedure: RIGHT HEART CATH AND CORONARY/GRAFT ANGIOGRAPHY;  Surgeon: Jolaine Artist, MD;  Location: [place] [facility];  Service: Cardiovascular;  Laterality: N/A;  . TONSILLECTOMY      Family History  Problem Relation Age of Onset  . Alzheimer's disease Mother   . COPD Daughter   . Alzheimer's disease Other   . COPD Sister   .  Depression Sister   . Heart disease Sister   . Hyperlipidemia Sister   . Hypertension Sister   . Esophageal cancer Sister        + smoker    Social History   Socioeconomic History  . Marital status: Married    Spouse name: Not on file  . Number of children: Not on file  . Years of education: Not on file  . Highest education level: Not on file  Occupational History  . Not on file  Tobacco Use  . Smoking status: Never Smoker  . Smokeless tobacco: Never Used  Substance and Sexual Activity  . Alcohol use: Not Currently    Comment: 04/22/2015 "nothing since the 1980s; never had a problem w/it"  . Drug use: No  . Sexual activity: Not Currently  Other Topics Concern  . Not on file  Social History Narrative   Married, lives with spouse [name]   4 children, 2 boys and 2 girls > one daughter passed   OCCUPATION: retired, Pension scheme manager for [employer] Strain:   . Difficulty of Paying Living Expenses:   Food Insecurity:   . Worried About Charity fundraiser in the Last Year:   . Ran  Out of Food in the Last Year:   Transportation Needs:   . Lack of Transportation (Medical):   Marland Kitchen Lack of Transportation (Non-Medical):   Physical Activity:   . Days of Exercise per Week:   . Minutes of Exercise per Session:   Stress:   . Feeling of Stress :   Social Connections:   . Frequency of Communication with Friends and Family:   . Frequency of Social Gatherings with Friends and Family:   . Attends Religious Services:   . Active Member of Clubs or Organizations:   . Attends Archivist Meetings:   Marland Kitchen Marital Status:     No current facility-administered medications on file prior to encounter.   Current Outpatient Medications on File Prior to Encounter  Medication Sig Dispense Refill  . acetaminophen (TYLENOL) 325 MG tablet Take 325-650 mg by mouth every 6 (six) hours as needed for mild pain.     Marland Kitchen albuterol  (PROVENTIL HFA;VENTOLIN HFA) 108 (90 Base) MCG/ACT inhaler Inhale 1-2 puffs into the lungs every 6 (six) hours as needed for wheezing.     Marland Kitchen atorvastatin (LIPITOR) 20 MG tablet TAKE 1 TABLET EVERY DAY  AT  6  PM (Patient taking differently: Take 20 mg by mouth every evening. ) 90 tablet 2  . carvedilol (COREG) 25 MG tablet TAKE 1 TABLET TWICE DAILY (DOSE INCREASE) (Patient taking differently: Take 25 mg by mouth 2 (two) times daily with a meal. ) 180 tablet 3  . ELIQUIS 2.5 MG TABS tablet TAKE 1 TABLET TWICE DAILY (Patient taking differently: Take 2.5 mg by mouth 2 (two) times daily. ) 180 tablet 1  . famotidine (PEPCID) 40 MG tablet Take 40 mg by mouth every evening.     . furosemide (LASIX) 40 MG tablet TAKE 1 TABLET TWICE DAILY (Patient taking differently: Take 40 mg by mouth daily. ) 180 tablet 3  . hydrALAZINE (APRESOLINE) 25 MG tablet TAKE 1/2 TABLET TWICE DAILY BEFORE MEALS. PLEASE KEEP UPCOMING APPT IN JANUARY WITH DR. Radford Pax FOR FUTURE REFILLS. THANK YOU (Patient taking differently: Take 12.5 mg by mouth 2 (two) times daily before a meal. ) 90 tablet 3  . isosorbide mononitrate (IMDUR) 30 MG 24 hr tablet TAKE 1 TABLET EVERY DAY (Patient taking differently: Take 30 mg by mouth every evening. ) 90 tablet 2  . mexiletine (MEXITIL) 150 MG capsule Take 2 capsules (300 mg total) by mouth 3 (three) times daily. (Patient taking differently: Take 300 mg by mouth 2 (two) times daily. ) 540 capsule 2  . Multiple Vitamin (MULTIVITAMIN WITH MINERALS) TABS tablet Take 1 tablet by mouth daily.     . Omega-3 Fatty Acids (FISH OIL) 1000 MG CAPS Take 2,000 mg by mouth 2 (two) times daily.    . pantoprazole (PROTONIX) 40 MG tablet Take 40 mg by mouth daily.    . sotalol (BETAPACE) 80 MG tablet Take 0.5 tablets (40 mg total) by mouth 2 (two) times daily. 90 tablet 2  . traMADol (ULTRAM) 50 MG tablet Take 1 tablet (50 mg total) by mouth every 12 (twelve) hours as needed for moderate pain or severe pain. 20 tablet 0   . ciprofloxacin (CIPRO) 500 MG tablet Take 500 mg by mouth 2 (two) times daily.      Review of Systems: a complete, 10pt review of systems was completed with pertinent positives and negatives as documented in the HPI  Physical Exam: Vitals:   09/11/19 0801 09/11/19 0804  BP: 92/76 140/63  Pulse:  63  Resp:    Temp:    SpO2:  96%   Gen: A&Ox3, no distress  Eyes: lids and conjunctivae normal, no icterus. Pupils equally round and reactive to light.  Chest: respiratory effort is normal. No crepitus or tenderness on palpation of the chest.   Cardiovascular: RRR Gastrointestinal: soft, minimally distended, nontender. No mass, hepatomegaly or splenomegaly. Lymphatic: no lymphadenopathy in the neck or groin Neuro: cranial nerves grossly intact.  Sensation intact to light touch diffusely. Psych: appropriate mood and affect, normal insight/judgment intact  Skin: warm and dry   CBC Latest Ref Rng & Units 09/11/2019 09/10/2019 09/09/2019  WBC 4.0 - 10.5 K/uL 12.6(H) 13.4(H) 12.5(H)  Hemoglobin 13.0 - 17.0 g/dL 11.4(L) 11.6(L) 11.9(L)  Hematocrit 39.0 - 52.0 % 35.9(L) 36.0(L) 37.1(L)  Platelets 150 - 400 K/uL 378 382 366    CMP Latest Ref Rng & Units 09/11/2019 09/09/2019 09/08/2019  Glucose 70 - 99 mg/dL 91 114(H) 126(H)  BUN 8 - 23 mg/dL 38(H) 37(H) 31(H)  Creatinine 0.61 - 1.24 mg/dL 1.93(H) 1.95(H) 1.87(H)  Sodium 135 - 145 mmol/L 137 138 138  Potassium 3.5 - 5.1 mmol/L 4.0 3.9 4.1  Chloride 98 - 111 mmol/L 105 103 105  CO2 22 - 32 mmol/L 21(L) 21(L) 23  Calcium 8.9 - 10.3 mg/dL 7.8(L) 8.0(L) 8.0(L)  Total Protein 6.5 - 8.1 g/dL - - 4.9(L)  Total Bilirubin 0.3 - 1.2 mg/dL - - 0.7  Alkaline Phos 38 - 126 U/L - - 36(L)  AST 15 - 41 U/L - - 28  ALT 0 - 44 U/L - - 25    Lab Results  Component Value Date   INR 1.55 07/26/2017   INR 1.65 05/26/2017   INR 1.52 05/08/2017    Imaging: CT ABDOMEN PELVIS W CONTRAST  Result Date: 09/11/2019 CLINICAL DATA:  Persistent leukocytosis, acute  diverticulitis EXAM: CT ABDOMEN AND PELVIS WITH CONTRAST TECHNIQUE: Multidetector CT imaging of the abdomen and pelvis was performed using the standard protocol following bolus administration of intravenous contrast. CONTRAST:  67m OMNIPAQUE IOHEXOL 300 MG/ML  SOLN COMPARISON:  09/08/2019, 09/02/2019 FINDINGS: Lower chest: There are small bilateral pleural effusions with minimal compressive bilateral lower lobe atelectasis. Hepatobiliary: Attenuation material within the gallbladder consistent with biliary sludge. No calcified gallstones or evidence of cholecystitis. No focal liver abnormalities. Pancreas: Unremarkable. No pancreatic ductal dilatation or surrounding inflammatory changes. Spleen: Normal in size without focal abnormality. Adrenals/Urinary Tract: There is bilateral renal cortical atrophy. Otherwise the kidneys enhance normally and symmetrically. No urinary tract calculi or obstruction. The bladder is unremarkable. The adrenals are normal. Stomach/Bowel: There is continued wall thickening of the sigmoid colon compatible with acute diverticulitis. There is a small pocket of extraluminal gas within the left lower quadrant measuring up to 2.2 cm in size. Inflammatory phlegmon adjacent to the sigmoid colon seen previously on image 62 has resolved in the interim. There is no fluid collection or abscess. There is continued mild distension of the small bowel with no evidence of high-grade obstruction, as oral contrast is seen throughout the colon to the level of the splenic flexure. Vascular/Lymphatic: Aortic atherosclerosis. No enlarged abdominal or pelvic lymph nodes. Reproductive: Prostate is unremarkable. Other: There is trace free fluid within the bilateral paracolic gutters. Mild residual mesenteric inflammatory change within the lower pelvis. There are bilateral fat containing inguinal hernias unchanged. Small hiatal hernia again noted. Musculoskeletal: There are no acute or destructive bony lesions.  Reconstructed images demonstrate no additional findings. IMPRESSION: 1. Continued wall thickening  of the sigmoid colon compatible with acute diverticulitis. There is a small pocket of extraluminal gas within the left lower quadrant measuring up to 2.2 cm in size, consistent with localized perforation. Resolution of the inflammatory phlegmon seen within the lower pelvis on previous study, with no fluid collection or abscess seen today. 2. Persistent mild distension of the small bowel most consistent with ileus. No signs of high-grade obstruction. 3. Small bilateral pleural effusions. 4. Aortic Atherosclerosis (ICD10-I70.0). These results will be called to the ordering clinician or representative by the Radiologist Assistant, and communication documented in the PACS or [employer]. Electronically Signed   By: Randa Ngo M.D.   On: 09/11/2019 15:24     A/P: 86 year old gentleman with multiple severe medical problems suffering his 4th bout of diverticulitis. Repeat CT scan today after 10 days of treatment demonstrates persistent mild ileus, significant improvement in the inflammatory changes in the pelvis, but a new 2.2 cm pocket of extraluminal air suggestive of microperforation. At this point it seems as though he is making progress clinically albeit quite slowly. Given lack of pain, completely benign abdominal exam, and bowel movement that he reports today, hopefully this represents continued improvement.  I discussed with the patient and his family at the bedside that this microperforation may seal on its own, may develop into an abscess which would be seen on repeat CT scan and require percutaneous drainage, or less likely evolving to free perforation which would require emergency surgery. We discussed that surgical treatment for this disease would require a large incision and a colostomy, and that his risk of complications is high given his age and other comorbidities.   At this point would  continue current treatment. Consider transitioning from Eliquis to heparin drip in case of procedures needed in the future. Okay to let him have a full liquid diet from my standpoint. We will continue to follow along.   Patient Active Problem List   Diagnosis Date Noted  . Orthostatic hypotension 09/11/2019  . Ileus ([place]) 09/04/2019  . Hyponatremia 09/03/2019  . Metabolic acidosis 06/18/8116  . Diverticulitis 09/02/2019  . Acute urinary retention 09/02/2019  . Lower extremity weakness 11/29/2018  . Multiple thyroid nodules 08/13/2017  . DCM (dilated cardiomyopathy) ([place]) 06/16/2017  . History of aortic valve replacement with bioprosthetic valve   . Cardiac resynchronization therapy defibrillator (CRT-D) in place   . Protein-calorie malnutrition, severe 05/27/2017  . Hyperthyroidism 05/26/2017  . Cough 05/26/2017  . Pleural effusion on right 05/26/2017  . Malnutrition of moderate degree 05/09/2017  . GI bleed 05/08/2017  . VT (ventricular tachycardia) ([place]) 05/06/2017  . Dizziness 04/23/2017  . Weight loss 04/23/2017  . Leg weakness 01/28/2017  . Chronic combined systolic (congestive) and diastolic (congestive) heart failure ([place])   . Shingles 08/23/2014  . Stage 4 chronic kidney disease ([place]) 08/23/2014  . Hyperkalemia 08/23/2014  . Essential hypertension 01/23/2013  . Dyslipidemia 01/23/2013  . Coronary artery disease 01/23/2013  . Persistent atrial fibrillation ([place]) 01/23/2013  . Chronic anticoagulation 01/23/2013       Romana Juniper, MD [facility]  See AMION to contact appropriate on-call provider

## 2019-09-11 NOTE — Care Management Important Message (Signed)
Important Message  Patient Details  Name: [name] MRN: [id number] Date of Birth: 1932/07/22   Medicare Important Message Given:  Yes     [name] 09/11/2019, 12:08 PM

## 2019-09-11 NOTE — Progress Notes (Signed)
ANTICOAGULATION CONSULT NOTE - Initial Consult  Pharmacy Consult for heparin Indication: atrial fibrillation  Allergies  Allergen Reactions  . Novocain [Procaine] Swelling    SWELLING REACTION UNSPECIFIED   . Amiodarone Nausea And Vomiting    Patient Measurements: Height: 6' (182.9 cm) Weight: 93.8 kg (206 lb 12.8 oz) IBW/kg (Calculated) : 77.6 Heparin Dosing Weight: 93kg  Vital Signs: BP: 140/63 (06/04 0804) Pulse Rate: 63 (06/04 0804)  Labs: Recent Labs    09/09/19 0718 09/09/19 0718 09/10/19 0344 09/11/19 0359  HGB 11.9*   < > 11.6* 11.4*  HCT 37.1*  --  36.0* 35.9*  PLT 366  --  382 378  CREATININE 1.95*  --   --  1.93*   < > = values in this interval not displayed.    Estimated Creatinine Clearance: 33.9 mL/min (A) (by C-G formula based on SCr of 1.93 mg/dL (H)).   Medical History: Past Medical History:  Diagnosis Date  . Arthritis    "minor everywhere" (04/22/2015)  . Asthma    "a touch"  . Barrett esophagus   . CAD (coronary artery disease)    a. s/p 3 vvessel CABG with LIMA to LAD, SVG to diagonal 1, SVG to RCA 12/09.  . Cardiac resynchronization therapy defibrillator (CRT-D) in place   . Chronic anticoagulation 01/23/2013  . Chronic combined systolic and diastolic CHF (congestive heart failure) (HCC)    dry weight 197-200lbs.  . CKD (chronic kidney disease) stage 3, GFR 30-59 ml/min 08/23/2014  . DCM (dilated cardiomyopathy) ([place])    initially concerned for TTP amyloidosis but Tc-PYP scan 06/2017 was not consistent with amyloid and SPEP/UPEP with no M spike. EF 20-25% by echo 2019  . Diverticulitis 08/2019  . Dyslipidemia 01/23/2013  . Essential hypertension 01/23/2013  . GERD (gastroesophageal reflux disease)   . GI bleed 05/08/2017  . H/O: rheumatic fever   . Hepatitis 1957   "don't know what kind:  . History of hiatal hernia   . History of PFTs    a. Amiodarone started 5/16 >> PFTs w/ DLCO 6/16:  FEV1 72% predicted, FEV1/FVC 66%, DLCO 66% >>  minimal reversible obstructive airways disease with mild diffusion defect (suggestive of emphysema but absence of hyperinflation inconsistent with dx)  . Hyperkalemia 08/23/2014  . Hyperthyroidism 05/26/2017  . Leg weakness 01/28/2017  . Multiple thyroid nodules 08/13/2017  . Orthostatic hypotension   . Persistent atrial fibrillation ([place]) 01/23/2013  . Pleural effusion on right 05/26/2017  . Pneumonia 08/2014   "dr thought I may have had a touch"  . Pre-diabetes   . Protein-calorie malnutrition, severe 05/27/2017  . S/P AVR (aortic valve replacement) 2009   a. severe AS s/p AVR with pericardial tissue valve 2009.  Marland Kitchen Shingles 08/23/2014  . VT (ventricular tachycardia) ([place]) 05/06/2017     Assessment: 89 yoM on apixaban PTA for hx AFib admitted with recurrent diverticulitis. Repeat CT scan this morning showed localized perforation. Pharmacy asked to transition apixaban to IV heparin in case procedures needed.  Last dose of apixaban was this morning (6/4) at 0931. Will dose via aPTTs until DOAC clears.  Goal of Therapy:  Heparin level 0.3-0.7 units/ml aPTT 66-102 seconds Monitor platelets by anticoagulation protocol: Yes   Plan:  -Start heparin 1300 units/h no bolus at 2130 tonight -Check 8hr aPTT -Daily aPTT, heparin level, and CBC   Arrie Senate, PharmD, BCPS Clinical Pharmacist [phone] Please check AMION for all [place] numbers 09/11/2019

## 2019-09-12 LAB — APTT
aPTT: 163 seconds (ref 24–36)
aPTT: 124 seconds — ABNORMAL HIGH (ref 24–36)

## 2019-09-12 LAB — GLUCOSE, CAPILLARY: Glucose-Capillary: 177 mg/dL — ABNORMAL HIGH (ref 70–99)

## 2019-09-12 MED ORDER — HEPARIN (PORCINE) 25000 UT/250ML-% IV SOLN
950.0000 [IU]/h | INTRAVENOUS | Status: AC
  Administered 2019-09-12: 1000 [IU]/h via INTRAVENOUS
  Administered 2019-09-14: 800 [IU]/h via INTRAVENOUS
  Administered 2019-09-15: 1000 [IU]/h via INTRAVENOUS
  Administered 2019-09-16: 950 [IU]/h via INTRAVENOUS
  Filled 2019-09-12 (×4): qty 250

## 2019-09-12 MED ORDER — FUROSEMIDE 40 MG PO TABS
40.0000 mg | ORAL_TABLET | Freq: Every day | ORAL | Status: DC
  Administered 2019-09-12 – 2019-09-21 (×10): 40 mg via ORAL
  Filled 2019-09-12 (×10): qty 1

## 2019-09-12 NOTE — Progress Notes (Signed)
[place] for heparin Indication: atrial fibrillation  Allergies  Allergen Reactions  . Novocain [Procaine] Swelling    SWELLING REACTION UNSPECIFIED   . Amiodarone Nausea And Vomiting    Patient Measurements: Height: 6' (182.9 cm) Weight: 96.1 kg (211 lb 12.8 oz) IBW/kg (Calculated) : 77.6 Heparin Dosing Weight: 96kg  Vital Signs: Temp: 98.4 F (36.9 C) (06/05 1133) Temp Source: Oral (06/05 1133) BP: 128/67 (06/05 1228) Pulse Rate: 63 (06/05 1228)  Labs: Recent Labs    09/10/19 0344 09/11/19 0359 09/12/19 0602 09/12/19 1628  HGB 11.6* 11.4*  --   --   HCT 36.0* 35.9*  --   --   PLT 382 378  --   --   APTT  --   --  163* 124*  CREATININE  --  1.93*  --   --     Estimated Creatinine Clearance: 34.3 mL/min (A) (by C-G formula based on SCr of 1.93 mg/dL (H)).   Medical History: Past Medical History:  Diagnosis Date  . Arthritis    "minor everywhere" (04/22/2015)  . Asthma    "a touch"  . Barrett esophagus   . CAD (coronary artery disease)    a. s/p 3 vvessel CABG with LIMA to LAD, SVG to diagonal 1, SVG to RCA 12/09.  . Cardiac resynchronization therapy defibrillator (CRT-D) in place   . Chronic anticoagulation 01/23/2013  . Chronic combined systolic and diastolic CHF (congestive heart failure) (HCC)    dry weight 197-200lbs.  . CKD (chronic kidney disease) stage 3, GFR 30-59 ml/min 08/23/2014  . DCM (dilated cardiomyopathy) ([place])    initially concerned for TTP amyloidosis but Tc-PYP scan 06/2017 was not consistent with amyloid and SPEP/UPEP with no M spike. EF 20-25% by echo 2019  . Diverticulitis 08/2019  . Dyslipidemia 01/23/2013  . Essential hypertension 01/23/2013  . GERD (gastroesophageal reflux disease)   . GI bleed 05/08/2017  . H/O: rheumatic fever   . Hepatitis 1957   "don't know what kind:  . History of hiatal hernia   . History of PFTs    a. Amiodarone started 5/16 >> PFTs w/ DLCO 6/16:  FEV1 72%  predicted, FEV1/FVC 66%, DLCO 66% >> minimal reversible obstructive airways disease with mild diffusion defect (suggestive of emphysema but absence of hyperinflation inconsistent with dx)  . Hyperkalemia 08/23/2014  . Hyperthyroidism 05/26/2017  . Leg weakness 01/28/2017  . Multiple thyroid nodules 08/13/2017  . Orthostatic hypotension   . Persistent atrial fibrillation ([place]) 01/23/2013  . Pleural effusion on right 05/26/2017  . Pneumonia 08/2014   "dr thought I may have had a touch"  . Pre-diabetes   . Protein-calorie malnutrition, severe 05/27/2017  . S/P AVR (aortic valve replacement) 2009   a. severe AS s/p AVR with pericardial tissue valve 2009.  Marland Kitchen Shingles 08/23/2014  . VT (ventricular tachycardia) ([place]) 05/06/2017     Assessment: 70 yoM on apixaban PTA for hx AFib admitted with recurrent diverticulitis. Repeat CT scan this morning showed localized perforation. Pharmacy asked to transition apixaban to IV heparin in case procedures needed. Last dose of apixaban was 6/4 at 0931. Will dose via aPTTs until DOAC clears. -aPTT down to 124 after infusion hold and decrease to 1000 units/hr   Goal of Therapy:  Heparin level 0.3-0.7 units/ml aPTT 66-102 seconds Monitor platelets by anticoagulation protocol: Yes   Plan:  -Decrease heparin to 800 units/hr -Heparin level daily wth CBC daily  Hildred Laser, PharmD Clinical Pharmacist **Pharmacist phone directory  can now be found on [email or web address] ([email or web address]).  Listed under [place].

## 2019-09-12 NOTE — Progress Notes (Signed)
Pharmacy Antibiotic Note  [name] is a 86 y.o. male admitted on 09/01/2019 with intra-abdominal infection.  Pharmacy consulted 09/02/19 for cefepime dosing. Metronidazole added 5/26.   Patient is afebrile and appears to be improving slowly. He is afebrile. WBC count is stable around 12. CT of abdomen on 6/4 shows resolution of inflammatory phlegmon with no fluid collection or abscess, but with continued wall thickening and small pocket of extraluminal gas. Renal function has been stable, creatinine on 6/4 at 1.93 eCrCl ~34 ml/min.   Plan: Continue cefepime 2 gm IV Q 12 hours through 09/15/19. Continue metronidazole 500 mg IV Q8h through 09/15/2019 Monitor CBC, renal fx, cultures and clinical progress    Height: 6' (182.9 cm) Weight: 96.1 kg (211 lb 12.8 oz) IBW/kg (Calculated) : 77.6  Temp (24hrs), Avg:98.3 F (36.8 C), Min:98.2 F (36.8 C), Max:98.5 F (36.9 C)  Recent Labs  Lab 09/05/19 0827 09/05/19 1143 09/05/19 1502 09/06/19 0545 09/06/19 0545 09/07/19 0328 09/07/19 0824 09/08/19 0351 09/09/19 0718 09/10/19 0344 09/11/19 0359  WBC   < >  --   --  9.4   < > 12.9*  --  12.0* 12.5* 13.4* 12.6*  CREATININE  --   --   --  1.79*  --  1.67*  --  1.87* 1.95*  --  1.93*  LATICACIDVEN  --  1.2 1.0  --   --   --  1.3  --   --   --   --    < > = values in this interval not displayed.    Estimated Creatinine Clearance: 34.3 mL/min (A) (by C-G formula based on SCr of 1.93 mg/dL (H)).    Allergies  Allergen Reactions  . Novocain [Procaine] Swelling    SWELLING REACTION UNSPECIFIED   . Amiodarone Nausea And Vomiting    Antimicrobials this admission: Cefepime 5/26 >> 6/8 Metronidazole 5/26>>6/8  Dose adjustments this admission: Cefepime 2gm q24 to 2gm IV q12h  Microbiology results: 5/26 BCx: (anaerobic): (diphtheroids/corynebacterium > likely contaminant  5/26 UCx:  Multiple species   Thank you,   Eddie Candle, PharmD PGY-1 Pharmacy Resident   Please check  amion for clinical pharmacist contact number

## 2019-09-12 NOTE — Progress Notes (Signed)
ANTICOAGULATION CONSULT NOTE - Initial Consult  Pharmacy Consult for heparin Indication: atrial fibrillation  Allergies  Allergen Reactions   Novocain [Procaine] Swelling    SWELLING REACTION UNSPECIFIED    Amiodarone Nausea And Vomiting    Patient Measurements: Height: 6' (182.9 cm) Weight: 96.1 kg (211 lb 12.8 oz) IBW/kg (Calculated) : 77.6 Heparin Dosing Weight: 96kg  Vital Signs: Temp: 98.2 F (36.8 C) (06/05 0408) Temp Source: Oral (06/05 0408) BP: 127/59 (06/05 0408) Pulse Rate: 64 (06/05 0408)  Labs: Recent Labs    09/09/19 0718 09/09/19 0718 09/10/19 0344 09/11/19 0359  HGB 11.9*   < > 11.6* 11.4*  HCT 37.1*  --  36.0* 35.9*  PLT 366  --  382 378  CREATININE 1.95*  --   --  1.93*   < > = values in this interval not displayed.    Estimated Creatinine Clearance: 34.3 mL/min (A) (by C-G formula based on SCr of 1.93 mg/dL (H)).   Medical History: Past Medical History:  Diagnosis Date   Arthritis    "minor everywhere" (04/22/2015)   Asthma    "a touch"   Barrett esophagus    CAD (coronary artery disease)    a. s/p 3 vvessel CABG with LIMA to LAD, SVG to diagonal 1, SVG to RCA 12/09.   Cardiac resynchronization therapy defibrillator (CRT-D) in place    Chronic anticoagulation 01/23/2013   Chronic combined systolic and diastolic CHF (congestive heart failure) (HCC)    dry weight 197-200lbs.   CKD (chronic kidney disease) stage 3, GFR 30-59 ml/min 08/23/2014   DCM (dilated cardiomyopathy) ([place])    initially concerned for TTP amyloidosis but Tc-PYP scan 06/2017 was not consistent with amyloid and SPEP/UPEP with no M spike. EF 20-25% by echo 2019   Diverticulitis 08/2019   Dyslipidemia 01/23/2013   Essential hypertension 01/23/2013   GERD (gastroesophageal reflux disease)    GI bleed 05/08/2017   H/O: rheumatic fever    Hepatitis 1957   "don't know what kind:   History of hiatal hernia    History of PFTs    a. Amiodarone started 5/16  >> PFTs w/ DLCO 6/16:  FEV1 72% predicted, FEV1/FVC 66%, DLCO 66% >> minimal reversible obstructive airways disease with mild diffusion defect (suggestive of emphysema but absence of hyperinflation inconsistent with dx)   Hyperkalemia 08/23/2014   Hyperthyroidism 05/26/2017   Leg weakness 01/28/2017   Multiple thyroid nodules 08/13/2017   Orthostatic hypotension    Persistent atrial fibrillation ([place]) 01/23/2013   Pleural effusion on right 05/26/2017   Pneumonia 08/2014   "dr thought I may have had a touch"   Pre-diabetes    Protein-calorie malnutrition, severe 05/27/2017   S/P AVR (aortic valve replacement) 2009   a. severe AS s/p AVR with pericardial tissue valve 2009.   Shingles 08/23/2014   VT (ventricular tachycardia) ([place]) 05/06/2017     Assessment: 69 yoM on apixaban PTA for hx AFib admitted with recurrent diverticulitis. Repeat CT scan this morning showed localized perforation. Pharmacy asked to transition apixaban to IV heparin in case procedures needed. Last dose of apixaban was 6/4 at 0931. Will dose via aPTTs until DOAC clears.  8 hour aTT this morning is supratherapeutic at 163 seconds on 1300 units of heparin/hr. H&H is stable at 11.4/35.9, plts stable and wnl at 378. Personally assessed that lab was drawn appropriately from patient's R hand with heparin infusing in L PIV. No signs of bleeding noted.   Goal of Therapy:  Heparin level 0.3-0.7 units/ml  aPTT 66-102 seconds Monitor platelets by anticoagulation protocol: Yes   Plan:  -Hold heparin for one hour (infusion stopped at 0755) -Restart heparin at 0900 at a decreased rate of 1000 units/hr -8 hour aPTT  -Daily aPTT, heparin level, and CBC -Monitor for signs of bleeding -Follow up surgery plans and when to transition back to Eliquis    Thank you,   Eddie Candle, PharmD PGY-1 Pharmacy Resident   Please check amion for clinical pharmacist contact number

## 2019-09-12 NOTE — Progress Notes (Addendum)
[facility] PROGRESS NOTE    Progress Note  [name]  QVZ:[id number] DOB: [birth year] DOA: 09/01/2019 PCP: [name], MD     Brief Narrative:   [name] is an 86 y.o. male admitted to the hospital with acute diverticulitis and nonsustained V. tach and ileus.  NG tube was placed and he has been treated with conservative management.  His NG tube was clamped on 09/08/2019 he was only putting about 500 cc.  X-ray was obtained that showed dilated loops.  Assessment/Plan:   Acute severe diverticulitis/ileus: He was started empirically on IV cefepime and Flagyl. Abdominal x-ray on 09/09/2019 showed the contrast within the colon, continues to pass gas but no bowel movement. Abdominal exam is benign he continues to have full liquid diet which he is tolerating he had a bowel movement yesterday in the afternoon. Surgery was consulted and recommended continue conservative treatment. He has three more days of IV antibiotics.  Chronic combined systolic and diastolic heart failure: Continue daily standing weights, strict I's and O's, restrict his fluids to 1500 cc resume his home dose of Lasix.  Chronic hyponatremia and metabolic acidosis: Resolved this morning.  Chronic kidney disease stage IV: Creatinine remained at baseline. With a baseline creatinine of 1.9-2.3.  Chronic atrial fibrillation: Continue Coreg, Eliquis held and started on IV heparin, and the unfortunate circumstances surgical intervention is needed. Chads Vasc score greater than 2.  Urinary retention: Appears to be resolved.  New orthostatic hypotension: Now resolved continue current regimen he will go home off hydralazine.     DVT prophylaxis: Eliquis Family Communication:none Status is: Inpatient  Remains inpatient appropriate because:Hemodynamically unstable and IV treatments appropriate due to intensity of illness or inability to take PO   Dispo: The patient is from: Home               Anticipated d/c is to: SNF              Anticipated d/c date is: 3 days              Patient currently is not medically stable to d/c.  Will go home with home health PT   Code Status:     Code Status Orders  (From admission, onward)         Start     Ordered   09/02/19 1119  Do not attempt resuscitation (DNR)  Continuous    Question Answer Comment  In the event of cardiac or respiratory ARREST Do not call a "code blue"   In the event of cardiac or respiratory ARREST Do not perform Intubation, CPR, defibrillation or ACLS   In the event of cardiac or respiratory ARREST Use medication by any route, position, wound care, and other measures to relive pain and suffering. May use oxygen, suction and manual treatment of airway obstruction as needed for comfort.      09/02/19 1121        Code Status History    Date Active Date Inactive Code Status Order ID Comments User Context   11/29/2018 1455 12/01/2018 2053 Full Code [id number]  Albertine Patricia, MD ED   06/15/2017 2214 06/20/2017 1940 Full Code [id number]  Marcie Mowers, MD ED   05/26/2017 1733 06/04/2017 2006 Full Code [id number]  Vashti Hey, MD ED   05/08/2017 1516 05/10/2017 1808 Full Code [id number]  Brenton Grills, PA-C ED   06/16/2015 1305 06/19/2015 1646 Full Code [id number]  Elgergawy, Silver Huguenin, MD ED   04/30/2015  2256 05/02/2015 1852 Full Code [id number]  Kinnie Feil, MD Inpatient   04/22/2015 1504 04/23/2015 1457 Full Code [id number]  Constance Haw, MD Inpatient   11/04/2014 0853 11/04/2014 1454 Full Code [id number]  Leonie Man, MD Inpatient   08/23/2014 0117 08/26/2014 1646 Full Code [id number]  Rise Patience, MD Inpatient   Advance Care Planning Activity        IV Access:    Peripheral IV   Procedures and diagnostic studies:   CT ABDOMEN PELVIS W CONTRAST  Result Date: 09/11/2019 CLINICAL DATA:  Persistent leukocytosis, acute diverticulitis EXAM: CT ABDOMEN AND PELVIS WITH  CONTRAST TECHNIQUE: Multidetector CT imaging of the abdomen and pelvis was performed using the standard protocol following bolus administration of intravenous contrast. CONTRAST:  45m OMNIPAQUE IOHEXOL 300 MG/ML  SOLN COMPARISON:  09/08/2019, 09/02/2019 FINDINGS: Lower chest: There are small bilateral pleural effusions with minimal compressive bilateral lower lobe atelectasis. Hepatobiliary: Attenuation material within the gallbladder consistent with biliary sludge. No calcified gallstones or evidence of cholecystitis. No focal liver abnormalities. Pancreas: Unremarkable. No pancreatic ductal dilatation or surrounding inflammatory changes. Spleen: Normal in size without focal abnormality. Adrenals/Urinary Tract: There is bilateral renal cortical atrophy. Otherwise the kidneys enhance normally and symmetrically. No urinary tract calculi or obstruction. The bladder is unremarkable. The adrenals are normal. Stomach/Bowel: There is continued wall thickening of the sigmoid colon compatible with acute diverticulitis. There is a small pocket of extraluminal gas within the left lower quadrant measuring up to 2.2 cm in size. Inflammatory phlegmon adjacent to the sigmoid colon seen previously on image 62 has resolved in the interim. There is no fluid collection or abscess. There is continued mild distension of the small bowel with no evidence of high-grade obstruction, as oral contrast is seen throughout the colon to the level of the splenic flexure. Vascular/Lymphatic: Aortic atherosclerosis. No enlarged abdominal or pelvic lymph nodes. Reproductive: Prostate is unremarkable. Other: There is trace free fluid within the bilateral paracolic gutters. Mild residual mesenteric inflammatory change within the lower pelvis. There are bilateral fat containing inguinal hernias unchanged. Small hiatal hernia again noted. Musculoskeletal: There are no acute or destructive bony lesions. Reconstructed images demonstrate no additional  findings. IMPRESSION: 1. Continued wall thickening of the sigmoid colon compatible with acute diverticulitis. There is a small pocket of extraluminal gas within the left lower quadrant measuring up to 2.2 cm in size, consistent with localized perforation. Resolution of the inflammatory phlegmon seen within the lower pelvis on previous study, with no fluid collection or abscess seen today. 2. Persistent mild distension of the small bowel most consistent with ileus. No signs of high-grade obstruction. 3. Small bilateral pleural effusions. 4. Aortic Atherosclerosis (ICD10-I70.0). These results will be called to the ordering clinician or representative by the Radiologist Assistant, and communication documented in the PACS or [employer] Electronically Signed   By: MRanda NgoM.D.   On: 09/11/2019 15:24     Medical Consultants:    None.  Anti-Infectives:   Cefepime and Flagyl.  Subjective:    JLizabeth Leydentolerating his diet had a bowel movement yesterday afternoon.  Objective:    Vitals:   09/11/19 0804 09/11/19 1752 09/11/19 2037 09/12/19 0408  BP: 140/63 (!) 141/53 (!) 114/53 (!) 127/59  Pulse: 63 60 62 64  Resp:  _0 Temp:  98.3 F (36.8 C) 98.5 F (36.9 C) 98.2 F (36.8 C)  TempSrc:  Oral Oral Oral  SpO2: 96% 98% 98% 96%  Weight:  96.1 kg  Height:       SpO2: 96 % O2 Flow Rate (L/min): 0 L/min   Intake/Output Summary (Last 24 hours) at 09/12/2019 0751 Last data filed at 09/12/2019 0408 Gross per 24 hour  Intake 1374.61 ml  Output 676 ml  Net 698.61 ml   Filed Weights   09/10/19 0206 09/11/19 0336 09/12/19 0408  Weight: 94 kg 93.8 kg 96.1 kg    Exam: General exam: In no acute distress. Respiratory system: Good air movement and clear to auscultation. Cardiovascular system: S1 & S2 heard, RRR. No JVD. Gastrointestinal system: Abdomen is nondistended, soft and nontender.  Extremities: No pedal edema. Skin: No rashes, lesions or ulcers  Data  Reviewed:    Labs: Basic Metabolic Panel: Recent Labs  Lab 09/06/19 0545 09/06/19 0545 09/07/19 0328 09/07/19 0328 09/08/19 0351 09/08/19 0351 09/09/19 0718 09/11/19 0359  NA 133*  --  135  --  138  --  138 137  K 3.7   < > 4.1   < > 4.1   < > 3.9 4.0  CL 104  --  105  --  105  --  103 105  CO2 22  --  20*  --  23  --  21* 21*  GLUCOSE 147*  --  126*  --  126*  --  114* 91  BUN 31*  --  27*  --  31*  --  37* 38*  CREATININE 1.79*  --  1.67*  --  1.87*  --  1.95* 1.93*  CALCIUM 7.7*  --  7.8*  --  8.0*  --  8.0* 7.8*  MG 1.9  --  2.0  --   --   --  2.0  --    < > = values in this interval not displayed.   GFR Estimated Creatinine Clearance: 34.3 mL/min (A) (by C-G formula based on SCr of 1.93 mg/dL (H)). Liver Function Tests: Recent Labs  Lab 09/06/19 0545 09/07/19 0328 09/08/19 0351  AST _0 ALT _1 ALKPHOS 38 39 36*  BILITOT 0.7 0.9 0.7  PROT 4.7* 5.0* 4.9*  ALBUMIN 1.8* 1.9* 1.9*   No results for input(s): LIPASE, AMYLASE in the last 168 hours. No results for input(s): AMMONIA in the last 168 hours. Coagulation profile No results for input(s): INR, PROTIME in the last 168 hours. COVID-19 Labs  No results for input(s): DDIMER, FERRITIN, LDH, CRP in the last 72 hours.  Lab Results  Component Value Date   [place] NEGATIVE 09/02/2019   SARSCOV2NAA NOT DETECTED 11/29/2018    CBC: Recent Labs  Lab 09/07/19 0328 09/08/19 0351 09/09/19 0718 09/10/19 0344 09/11/19 0359  WBC 12.9* 12.0* 12.5* 13.4* 12.6*  NEUTROABS 8.5* 7.3 7.9* 8.5* 8.1*  HGB 12.4* 11.8* 11.9* 11.6* 11.4*  HCT 37.9* 36.9* 37.1* 36.0* 35.9*  MCV 91.1 91.8 92.8 93.8 92.8  PLT 311 336 366 382 378   Cardiac Enzymes: No results for input(s): CKTOTAL, CKMB, CKMBINDEX, TROPONINI in the last 168 hours. BNP (last 3 results) No results for input(s): PROBNP in the last 8760 hours. CBG: No results for input(s): GLUCAP in the last 168 hours. D-Dimer: No results for input(s):  DDIMER in the last 72 hours. Hgb A1c: No results for input(s): HGBA1C in the last 72 hours. Lipid Profile: No results for input(s): CHOL, HDL, LDLCALC, TRIG, CHOLHDL, LDLDIRECT in the last 72 hours. Thyroid function studies: No results for input(s): TSH, T4TOTAL, T3FREE, THYROIDAB in the last  72 hours.  Invalid input(s): FREET3 Anemia work up: No results for input(s): VITAMINB12, FOLATE, FERRITIN, TIBC, IRON, RETICCTPCT in the last 72 hours. Sepsis Labs: Recent Labs  Lab 09/05/19 1143 09/05/19 1502 09/06/19 0545 09/07/19 0328 09/07/19 0824 09/08/19 0351 09/09/19 0718 09/10/19 0344 09/11/19 0359  WBC  --   --    < >   < >  --  12.0* 12.5* 13.4* 12.6*  LATICACIDVEN 1.2 1.0  --   --  1.3  --   --   --   --    < > = values in this interval not displayed.   Microbiology Recent Results (from the past 240 hour(s))  Blood Culture (routine x 2)     Status: None   Collection Time: 09/02/19  8:10 AM   Specimen: BLOOD RIGHT ARM  Result Value Ref Range Status   Specimen Description BLOOD RIGHT ARM  Final   Special Requests   Final    BOTTLES DRAWN AEROBIC AND ANAEROBIC Blood Culture adequate volume   Culture   Final    NO GROWTH 5 DAYS Performed at [facility], [address] [place], [place] [address]    Report Status 09/07/2019 FINAL  Final  Blood Culture (routine x 2)     Status: Abnormal   Collection Time: 09/02/19  8:10 AM   Specimen: BLOOD  Result Value Ref Range Status   Specimen Description BLOOD RIGHT ANTECUBITAL  Final   Special Requests   Final    BOTTLES DRAWN AEROBIC AND ANAEROBIC Blood Culture results may not be optimal due to an excessive volume of blood received in culture bottles   Culture  Setup Time   Final    GRAM POSITIVE RODS ANAEROBIC BOTTLE ONLY CRITICAL RESULT CALLED TO, READ BACK BY AND VERIFIED WITH: PHARMD [place] [id number] FCP    Culture (A)  Final    DIPHTHEROIDS(CORYNEBACTERIUM SPECIES) Standardized susceptibility testing for  this organism is not available. Performed at [facility], [place] [address], [place], [place] [address]    Report Status 09/05/2019 FINAL  Final  SARS Coronavirus 2 by RT PCR (hospital order, performed in [facility] hospital lab) Nasopharyngeal Nasopharyngeal Swab     Status: None   Collection Time: 09/02/19  8:12 AM   Specimen: Nasopharyngeal Swab  Result Value Ref Range Status   SARS Coronavirus 2 NEGATIVE NEGATIVE Final    Comment: (NOTE) SARS-CoV-2 target nucleic acids are NOT DETECTED. The SARS-CoV-2 RNA is generally detectable in upper and lower respiratory specimens during the acute phase of infection. The lowest concentration of SARS-CoV-2 viral copies this assay can detect is 250 copies / mL. A negative result does not preclude SARS-CoV-2 infection and should not be used as the sole basis for treatment or other patient management decisions.  A negative result may occur with improper specimen collection / handling, submission of specimen other than nasopharyngeal swab, presence of viral mutation(s) within the areas targeted by this assay, and inadequate number of viral copies (<250 copies / mL). A negative result must be combined with clinical observations, patient history, and epidemiological information. Fact Sheet for Patients:   StrictlyIdeas.no Fact Sheet for Healthcare Providers: BankingDealers.co.za This test is not yet approved or cleared  by the [place] FDA and has been authorized for detection and/or diagnosis of SARS-CoV-2 by FDA under an Emergency Use Authorization (EUA).  This EUA will remain in effect (meaning this test can be used) for the duration of the COVID-19 declaration  under Section 564(b)(1) of the Act, 21 U.S.C. section 360bbb-3(b)(1), unless the authorization is terminated or revoked sooner. Performed at [facility], [place] [address], [place], [place] [address]   Urine culture      Status: Abnormal   Collection Time: 09/02/19  9:45 AM   Specimen: In/Out Cath Urine  Result Value Ref Range Status   Specimen Description IN/OUT CATH URINE  Final   Special Requests   Final    NONE Performed at [facility], [place] [address], [place], [place] [address]    Culture MULTIPLE SPECIES PRESENT, SUGGEST RECOLLECTION (A)  Final   Report Status 09/03/2019 FINAL  Final     Medications:   . atorvastatin  20 mg Oral QPM  . carvedilol  25 mg Oral BID WC  . docusate sodium  100 mg Oral BID  . furosemide  40 mg Intravenous Daily  . isosorbide mononitrate  30 mg Oral QPM  . mexiletine  300 mg Oral BID  . pantoprazole  40 mg Oral Daily  . sodium chloride flush  3 mL Intravenous Q12H  . sotalol  80 mg Oral BID   Continuous Infusions: . ceFEPime (MAXIPIME) IV Stopped (09/11/19 2059)  . heparin 1,300 Units/hr (09/12/19 0400)  . metronidazole 500 mg (09/12/19 2446)      LOS: 10 days   Charlynne Cousins  [facility]  09/12/2019, 7:51 AM

## 2019-09-12 NOTE — Progress Notes (Addendum)
[facility] Progress Note     Subjective: CC-  Just finished eating breakfast. Reports abdominal pain is 2/10. Denies n/v but states that he is a little bloated. Passing flatus this morning and had a small BM yesterday.  Objective: Vital signs in last 24 hours: Temp:  [98.2 F (36.8 C)-98.5 F (36.9 C)] 98.2 F (36.8 C) (06/05 0408) Pulse Rate:  [60-64] 64 (06/05 0408) Resp:  [18-19] 19 (06/05 0408) BP: (114-141)/(53-59) 127/59 (06/05 0408) SpO2:  [96 %-98 %] 96 % (06/05 0408) Weight:  [96.1 kg] 96.1 kg (06/05 0408) Last BM Date: 09/11/19(Per report from Day shift)  Intake/Output from previous day: 06/04 0701 - 06/05 0700 In: 1374.6 [P.O.:480; I.V.:81.3; IV Piggyback:813.3] Out: 676 [Urine:675; Stool:1] Intake/Output this shift: No intake/output data recorded.  PE: Gen:  Alert, NAD, pleasant Card:  RRR Pulm:  CTAB anteriorly, no W/R/R, rate and effort normal Abd: Soft, mild distension, +BS, no HSM, nontender Skin: no rashes noted, warm and dry  Lab Results:  Recent Labs    09/10/19 0344 09/11/19 0359  WBC 13.4* 12.6*  HGB 11.6* 11.4*  HCT 36.0* 35.9*  PLT 382 378   BMET Recent Labs    09/11/19 0359  NA 137  K 4.0  CL 105  CO2 21*  GLUCOSE 91  BUN 38*  CREATININE 1.93*  CALCIUM 7.8*   PT/INR No results for input(s): LABPROT, INR in the last 72 hours. CMP     Component Value Date/Time   NA 137 09/11/2019 0359   NA 138 05/23/2018 1240   K 4.0 09/11/2019 0359   CL 105 09/11/2019 0359   CO2 21 (L) 09/11/2019 0359   GLUCOSE 91 09/11/2019 0359   BUN 38 (H) 09/11/2019 0359   BUN 27 05/23/2018 1240   CREATININE 1.93 (H) 09/11/2019 0359   CREATININE 1.72 (H) 04/14/2015 0942   CALCIUM 7.8 (L) 09/11/2019 0359   PROT 4.9 (L) 09/08/2019 0351   PROT 6.3 04/23/2017 1048   ALBUMIN 1.9 (L) 09/08/2019 0351   ALBUMIN 3.9 04/23/2017 1048   AST 28 09/08/2019 0351   ALT 25 09/08/2019 0351   ALKPHOS 36 (L) 09/08/2019 0351   BILITOT 0.7 09/08/2019  0351   BILITOT 0.8 04/23/2017 1048   GFRNONAA 31 (L) 09/11/2019 0359   GFRAA 36 (L) 09/11/2019 0359   Lipase     Component Value Date/Time   LIPASE 22 09/01/2019 1835       Studies/Results: CT ABDOMEN PELVIS W CONTRAST  Result Date: 09/11/2019 CLINICAL DATA:  Persistent leukocytosis, acute diverticulitis EXAM: CT ABDOMEN AND PELVIS WITH CONTRAST TECHNIQUE: Multidetector CT imaging of the abdomen and pelvis was performed using the standard protocol following bolus administration of intravenous contrast. CONTRAST:  50m OMNIPAQUE IOHEXOL 300 MG/ML  SOLN COMPARISON:  09/08/2019, 09/02/2019 FINDINGS: Lower chest: There are small bilateral pleural effusions with minimal compressive bilateral lower lobe atelectasis. Hepatobiliary: Attenuation material within the gallbladder consistent with biliary sludge. No calcified gallstones or evidence of cholecystitis. No focal liver abnormalities. Pancreas: Unremarkable. No pancreatic ductal dilatation or surrounding inflammatory changes. Spleen: Normal in size without focal abnormality. Adrenals/Urinary Tract: There is bilateral renal cortical atrophy. Otherwise the kidneys enhance normally and symmetrically. No urinary tract calculi or obstruction. The bladder is unremarkable. The adrenals are normal. Stomach/Bowel: There is continued wall thickening of the sigmoid colon compatible with acute diverticulitis. There is a small pocket of extraluminal gas within the left lower quadrant measuring up to 2.2 cm in size. Inflammatory phlegmon adjacent to the sigmoid  colon seen previously on image 62 has resolved in the interim. There is no fluid collection or abscess. There is continued mild distension of the small bowel with no evidence of high-grade obstruction, as oral contrast is seen throughout the colon to the level of the splenic flexure. Vascular/Lymphatic: Aortic atherosclerosis. No enlarged abdominal or pelvic lymph nodes. Reproductive: Prostate is unremarkable.  Other: There is trace free fluid within the bilateral paracolic gutters. Mild residual mesenteric inflammatory change within the lower pelvis. There are bilateral fat containing inguinal hernias unchanged. Small hiatal hernia again noted. Musculoskeletal: There are no acute or destructive bony lesions. Reconstructed images demonstrate no additional findings. IMPRESSION: 1. Continued wall thickening of the sigmoid colon compatible with acute diverticulitis. There is a small pocket of extraluminal gas within the left lower quadrant measuring up to 2.2 cm in size, consistent with localized perforation. Resolution of the inflammatory phlegmon seen within the lower pelvis on previous study, with no fluid collection or abscess seen today. 2. Persistent mild distension of the small bowel most consistent with ileus. No signs of high-grade obstruction. 3. Small bilateral pleural effusions. 4. Aortic Atherosclerosis (ICD10-I70.0). These results will be called to the ordering clinician or representative by the Radiologist Assistant, and communication documented in the PACS or [employer]. Electronically Signed   By: Randa Ngo M.D.   On: 09/11/2019 15:24    Anti-infectives: Anti-infectives (From admission, onward)   Start     Dose/Rate Route Frequency Ordered Stop   09/05/19 2200  ceFEPIme (MAXIPIME) 2 g in sodium chloride 0.9 % 100 mL IVPB     2 g 200 mL/hr over 30 Minutes Intravenous Every 12 hours 09/05/19 0847 09/15/19 2359   09/03/19 0800  ceFEPIme (MAXIPIME) 2 g in sodium chloride 0.9 % 100 mL IVPB  Status:  Discontinued     2 g 200 mL/hr over 30 Minutes Intravenous Every 24 hours 09/02/19 0725 09/05/19 0847   09/02/19 1500  metroNIDAZOLE (FLAGYL) IVPB 500 mg     500 mg 100 mL/hr over 60 Minutes Intravenous Every 8 hours 09/02/19 1121 09/15/19 2359   09/02/19 0730  ceFEPIme (MAXIPIME) 2 g in sodium chloride 0.9 % 100 mL IVPB     2 g 200 mL/hr over 30 Minutes Intravenous  Once 09/02/19 0721  09/02/19 0900   09/02/19 0730  metroNIDAZOLE (FLAGYL) IVPB 500 mg     500 mg 100 mL/hr over 60 Minutes Intravenous  Once 09/02/19 0721 09/02/19 1056       Assessment/Plan CHF - EF 30-35% CAD, H/o CABG AS s/p AVR  HTN CKD-IV Asthma Atrial fibrillation on eliquis (last dose 6/4 in AM) Orthostatic hypotension  Acute sigmoid diverticulitis with microperforation  - 4th bout  - CT 6/4 showed continued wall thickening of the sigmoid colon compatible with acute diverticulitis and a small pocket of extraluminal gas within the left lower quadrant measuring up to 2.2 cm in size consistent with localized perforation; resolution of the inflammatory phlegmon seen within the lower pelvis on previous study, with no fluid collection or abscess seen today; mild ileus  ID - maxipime/flagyl 5/26>> FEN - IVF, FLD VTE - IV heparin Foley - none Follow up - TBD  Plan: Seems to still be making slow progress. (emphasis on slow) Abdominal exam benign and he is having some bowel function.   Continue IV antibiotics.   Continue full liquids for now. Repeat CBC in AM. Continue to hold eliquis.   LOS: 10 days    Wellington Hampshire, PA-C  [facility] 09/12/2019, 8:36 AM Please see Amion for pager number during day hours 7:00am-4:30pm  Agree with above. Daughter, Fredderick Swanger, at bedside.  I answered questions as best I could.  He is distended. Says that he has felt light headed getting up.  Minimal abdominal pain.  Alphonsa Overall, MD, [facility] Office phone:  [phone]

## 2019-09-13 LAB — CBC
HCT: 36.7 % — ABNORMAL LOW (ref 39.0–52.0)
Hemoglobin: 11.7 g/dL — ABNORMAL LOW (ref 13.0–17.0)
MCH: 29.5 pg (ref 26.0–34.0)
MCHC: 31.9 g/dL (ref 30.0–36.0)
MCV: 92.4 fL (ref 80.0–100.0)
Platelets: 439 10*3/uL — ABNORMAL HIGH (ref 150–400)
RBC: 3.97 MIL/uL — ABNORMAL LOW (ref 4.22–5.81)
RDW: 15.1 % (ref 11.5–15.5)
WBC: 14.5 10*3/uL — ABNORMAL HIGH (ref 4.0–10.5)
nRBC: 0 % (ref 0.0–0.2)

## 2019-09-13 LAB — APTT: aPTT: 84 seconds — ABNORMAL HIGH (ref 24–36)

## 2019-09-13 LAB — HEPARIN LEVEL (UNFRACTIONATED): Heparin Unfractionated: 0.74 IU/mL — ABNORMAL HIGH (ref 0.30–0.70)

## 2019-09-13 NOTE — Progress Notes (Signed)
ANTICOAGULATION CONSULT NOTE - Initial Consult  Pharmacy Consult for heparin Indication: atrial fibrillation  Allergies  Allergen Reactions  . Novocain [Procaine] Swelling    SWELLING REACTION UNSPECIFIED   . Amiodarone Nausea And Vomiting    Patient Measurements: Height: 6' (182.9 cm) Weight: 96.3 kg (212 lb 4.8 oz) IBW/kg (Calculated) : 77.6 Heparin Dosing Weight: 96kg  Vital Signs: Temp: 98.2 F (36.8 C) (06/06 0416) Temp Source: Oral (06/06 0416) BP: 132/76 (06/06 0416) Pulse Rate: 62 (06/06 0416)  Labs: Recent Labs    09/11/19 0359 09/12/19 0602 09/12/19 1628 09/13/19 0515  HGB 11.4*  --   --  11.7*  HCT 35.9*  --   --  36.7*  PLT 378  --   --  439*  APTT  --  163* 124* 84*  HEPARINUNFRC  --   --   --  0.74*  CREATININE 1.93*  --   --   --     Estimated Creatinine Clearance: 34.3 mL/min (A) (by C-G formula based on SCr of 1.93 mg/dL (H)).   Medical History: Past Medical History:  Diagnosis Date  . Arthritis    "minor everywhere" (04/22/2015)  . Asthma    "a touch"  . Barrett esophagus   . CAD (coronary artery disease)    a. s/p 3 vvessel CABG with LIMA to LAD, SVG to diagonal 1, SVG to RCA 12/09.  . Cardiac resynchronization therapy defibrillator (CRT-D) in place   . Chronic anticoagulation 01/23/2013  . Chronic combined systolic and diastolic CHF (congestive heart failure) (HCC)    dry weight 197-200lbs.  . CKD (chronic kidney disease) stage 3, GFR 30-59 ml/min 08/23/2014  . DCM (dilated cardiomyopathy) ([place])    initially concerned for TTP amyloidosis but Tc-PYP scan 06/2017 was not consistent with amyloid and SPEP/UPEP with no M spike. EF 20-25% by echo 2019  . Diverticulitis 08/2019  . Dyslipidemia 01/23/2013  . Essential hypertension 01/23/2013  . GERD (gastroesophageal reflux disease)   . GI bleed 05/08/2017  . H/O: rheumatic fever   . Hepatitis 1957   "don't know what kind:  . History of hiatal hernia   . History of PFTs    a. Amiodarone  started 5/16 >> PFTs w/ DLCO 6/16:  FEV1 72% predicted, FEV1/FVC 66%, DLCO 66% >> minimal reversible obstructive airways disease with mild diffusion defect (suggestive of emphysema but absence of hyperinflation inconsistent with dx)  . Hyperkalemia 08/23/2014  . Hyperthyroidism 05/26/2017  . Leg weakness 01/28/2017  . Multiple thyroid nodules 08/13/2017  . Orthostatic hypotension   . Persistent atrial fibrillation ([place]) 01/23/2013  . Pleural effusion on right 05/26/2017  . Pneumonia 08/2014   "dr thought I may have had a touch"  . Pre-diabetes   . Protein-calorie malnutrition, severe 05/27/2017  . S/P AVR (aortic valve replacement) 2009   a. severe AS s/p AVR with pericardial tissue valve 2009.  Marland Kitchen Shingles 08/23/2014  . VT (ventricular tachycardia) ([place]) 05/06/2017     Assessment: 31 yoM on apixaban PTA for hx AFib admitted with recurrent diverticulitis. Repeat CT scan this morning showed localized perforation. Pharmacy asked to transition apixaban to IV heparin in case procedures needed. Last dose of apixaban was 6/4 at 0931. Will dose via aPTTs until DOAC clears. Renal function has been stable with a creatinine ~1.9.   aPTT this morning is therapeutic at 84 seconds after rate decrease to 800 units of heparin/hr. H&H is stable at 11.7/36.7, plts elevated at 439. No signs of bleeding noted. Heparin level  continues to be falsely elevated due to Eliquis. No overt bleeding noted.   Goal of Therapy:  Heparin level 0.3-0.7 units/ml aPTT 66-102 seconds Monitor platelets by anticoagulation protocol: Yes   Plan:  -Continue heparin infusion at 800 units/hour -Daily aPTT, heparin level, and CBC -Monitor for signs of bleeding -Follow up surgery plans and when to transition back to Eliquis    Thank you,   Eddie Candle, PharmD PGY-1 Pharmacy Resident   Please check amion for clinical pharmacist contact number

## 2019-09-13 NOTE — Progress Notes (Addendum)
[facility] Progress Note     Subjective: CC-   Daughter at bedside.  Feels about the same as yesterday. Denies any current abdominal pain, nausea, vomiting. States that he still feels bloated when he eats, but this gradually improves with time. Last BM 2 days ago, states that he is passing a lot of flatus this morning.  Main complaint is back pain. States that he's sore from sitting in the bed and wants to get up and walk.  WBC slightly up 14.5, afebrile  Objective: Vital signs in last 24 hours: Temp:  [97.7 F (36.5 C)-98.4 F (36.9 C)] 98.2 F (36.8 C) (06/06 0416) Pulse Rate:  [59-65] 62 (06/06 0416) Resp:  [16-19] 16 (06/06 0416) BP: (99-140)/(45-76) 132/76 (06/06 0416) SpO2:  [97 %] 97 % (06/06 0416) Weight:  [96.3 kg] 96.3 kg (06/06 0416) Last BM Date: 09/11/19  Intake/Output from previous day: 06/05 0701 - 06/06 0700 In: 1276.7 [P.O.:600; I.V.:176.7; IV Piggyback:500] Out: 975 [Urine:975] Intake/Output this shift: No intake/output data recorded.  PE: Gen:  Alert, NAD, pleasant Card:  RRR Pulm:  CTAB anteriorly, no W/R/R, rate and effort normal Abd: Soft, mild distension, +BS, no HSM, nontender Skin: no rashes noted, warm and dry  Lab Results:  Recent Labs    09/11/19 0359 09/13/19 0515  WBC 12.6* 14.5*  HGB 11.4* 11.7*  HCT 35.9* 36.7*  PLT 378 439*   BMET Recent Labs    09/11/19 0359  NA 137  K 4.0  CL 105  CO2 21*  GLUCOSE 91  BUN 38*  CREATININE 1.93*  CALCIUM 7.8*   PT/INR No results for input(s): LABPROT, INR in the last 72 hours. CMP     Component Value Date/Time   NA 137 09/11/2019 0359   NA 138 05/23/2018 1240   K 4.0 09/11/2019 0359   CL 105 09/11/2019 0359   CO2 21 (L) 09/11/2019 0359   GLUCOSE 91 09/11/2019 0359   BUN 38 (H) 09/11/2019 0359   BUN 27 05/23/2018 1240   CREATININE 1.93 (H) 09/11/2019 0359   CREATININE 1.72 (H) 04/14/2015 0942   CALCIUM 7.8 (L) 09/11/2019 0359   PROT 4.9 (L) 09/08/2019 0351    PROT 6.3 04/23/2017 1048   ALBUMIN 1.9 (L) 09/08/2019 0351   ALBUMIN 3.9 04/23/2017 1048   AST 28 09/08/2019 0351   ALT 25 09/08/2019 0351   ALKPHOS 36 (L) 09/08/2019 0351   BILITOT 0.7 09/08/2019 0351   BILITOT 0.8 04/23/2017 1048   GFRNONAA 31 (L) 09/11/2019 0359   GFRAA 36 (L) 09/11/2019 0359   Lipase     Component Value Date/Time   LIPASE 22 09/01/2019 1835       Studies/Results: CT ABDOMEN PELVIS W CONTRAST  Result Date: 09/11/2019 CLINICAL DATA:  Persistent leukocytosis, acute diverticulitis EXAM: CT ABDOMEN AND PELVIS WITH CONTRAST TECHNIQUE: Multidetector CT imaging of the abdomen and pelvis was performed using the standard protocol following bolus administration of intravenous contrast. CONTRAST:  19m OMNIPAQUE IOHEXOL 300 MG/ML  SOLN COMPARISON:  09/08/2019, 09/02/2019 FINDINGS: Lower chest: There are small bilateral pleural effusions with minimal compressive bilateral lower lobe atelectasis. Hepatobiliary: Attenuation material within the gallbladder consistent with biliary sludge. No calcified gallstones or evidence of cholecystitis. No focal liver abnormalities. Pancreas: Unremarkable. No pancreatic ductal dilatation or surrounding inflammatory changes. Spleen: Normal in size without focal abnormality. Adrenals/Urinary Tract: There is bilateral renal cortical atrophy. Otherwise the kidneys enhance normally and symmetrically. No urinary tract calculi or obstruction. The bladder is unremarkable. The adrenals  are normal. Stomach/Bowel: There is continued wall thickening of the sigmoid colon compatible with acute diverticulitis. There is a small pocket of extraluminal gas within the left lower quadrant measuring up to 2.2 cm in size. Inflammatory phlegmon adjacent to the sigmoid colon seen previously on image 62 has resolved in the interim. There is no fluid collection or abscess. There is continued mild distension of the small bowel with no evidence of high-grade obstruction, as oral  contrast is seen throughout the colon to the level of the splenic flexure. Vascular/Lymphatic: Aortic atherosclerosis. No enlarged abdominal or pelvic lymph nodes. Reproductive: Prostate is unremarkable. Other: There is trace free fluid within the bilateral paracolic gutters. Mild residual mesenteric inflammatory change within the lower pelvis. There are bilateral fat containing inguinal hernias unchanged. Small hiatal hernia again noted. Musculoskeletal: There are no acute or destructive bony lesions. Reconstructed images demonstrate no additional findings. IMPRESSION: 1. Continued wall thickening of the sigmoid colon compatible with acute diverticulitis. There is a small pocket of extraluminal gas within the left lower quadrant measuring up to 2.2 cm in size, consistent with localized perforation. Resolution of the inflammatory phlegmon seen within the lower pelvis on previous study, with no fluid collection or abscess seen today. 2. Persistent mild distension of the small bowel most consistent with ileus. No signs of high-grade obstruction. 3. Small bilateral pleural effusions. 4. Aortic Atherosclerosis (ICD10-I70.0). These results will be called to the ordering clinician or representative by the Radiologist Assistant, and communication documented in the PACS or [employer]. Electronically Signed   By: Randa Ngo M.D.   On: 09/11/2019 15:24    Anti-infectives: Anti-infectives (From admission, onward)   Start     Dose/Rate Route Frequency Ordered Stop   09/05/19 2200  ceFEPIme (MAXIPIME) 2 g in sodium chloride 0.9 % 100 mL IVPB     2 g 200 mL/hr over 30 Minutes Intravenous Every 12 hours 09/05/19 0847 09/15/19 2359   09/03/19 0800  ceFEPIme (MAXIPIME) 2 g in sodium chloride 0.9 % 100 mL IVPB  Status:  Discontinued     2 g 200 mL/hr over 30 Minutes Intravenous Every 24 hours 09/02/19 0725 09/05/19 0847   09/02/19 1500  metroNIDAZOLE (FLAGYL) IVPB 500 mg     500 mg 100 mL/hr over 60 Minutes  Intravenous Every 8 hours 09/02/19 1121 09/15/19 2359   09/02/19 0730  ceFEPIme (MAXIPIME) 2 g in sodium chloride 0.9 % 100 mL IVPB     2 g 200 mL/hr over 30 Minutes Intravenous  Once 09/02/19 0721 09/02/19 0900   09/02/19 0730  metroNIDAZOLE (FLAGYL) IVPB 500 mg     500 mg 100 mL/hr over 60 Minutes Intravenous  Once 09/02/19 0721 09/02/19 1056       Assessment/Plan CHF - EF 30-35% CAD, H/o CABG AS s/p AVR  HTN CKD-IV Asthma Atrial fibrillation on eliquis (last dose 6/4 in AM) Orthostatic hypotension Code status DNR  Acute sigmoid diverticulitis with microperforation             - 4th bout             - CT 6/4 showed continued wall thickening of the sigmoid colon compatible with acute diverticulitis and a small pocket of extraluminal gas within the left lower quadrant measuring up to 2.2 cm in size consistent with localized perforation; resolution of the inflammatory phlegmon seen within the lower pelvis on previous study, with no fluid collection or abscess seen today; mild ileus  ID - maxipime/flagyl 5/26>> FEN -  IVF, FLD VTE - IV heparin Foley - none Follow up - TBD  Plan: WBC slightly up today but abdominal exam fairly benign. Continue IV antibiotics and full liquids.   Mobilize, PT/OT.   Likely plan for repeat CT (last CT scan 6/4) scan early next week. Repeat labs in AM. Continue to hold eliquis.   LOS: 11 days    [facility] 09/13/2019, 10:01 AM Please see Amion for pager number during day hours 7:00am-4:30pm  Agree with above. The daughter was gone by the time I came by - but overall he looks better today. He needs help ambulating, unfortunately PT does not appear to be seeing him over the weekend. Check prealbumin tomorrow AM to get a handle on his nutrition.    Last CMP (6/1) showed Albumin 1.9.  Alphonsa Overall, MD, [facility] Office phone:  [phone]

## 2019-09-13 NOTE — Progress Notes (Signed)
[facility] PROGRESS NOTE    Progress Note  [name]  JJK:[id number] DOB: 30-Oct-1932 DOA: 09/01/2019 PCP: [name], MD     Brief Narrative:   [name] is an 86 y.o. male admitted to the hospital with acute diverticulitis and nonsustained V. tach and ileus.  NG tube was placed and he has been treated with conservative management.  His NG tube was clamped on 09/08/2019 he was only putting about 500 cc.  X-ray was obtained that showed dilated loops.  Assessment/Plan:   Acute severe diverticulitis/ileus: He was started empirically on IV cefepime and Flagyl. Repeated CT scan showed possible microperforation with gas collection. Surgery was consulted recommended conservative management, they are planning to repeat scan in the near future this upcoming week. He is having worsening leukocytosis with a rise on his platelet count, which is acting as an acute phase reactant. Continue empiric IV antibiotics.  Chronic combined systolic and diastolic heart failure: Continue daily standing weights, strict I's and O's, restrict his fluids to 1500 cc resume his home dose of Lasix.  Chronic hyponatremia and metabolic acidosis: Resolved this morning.  Chronic kidney disease stage IV: Creatinine remained at baseline. With a baseline creatinine of 1.9-2.3.  Chronic atrial fibrillation: Continue Coreg, Eliquis held and started on IV heparin, and the unfortunate circumstances surgical intervention is needed. Chads Vasc score greater than 2.  Urinary retention: Appears to be resolved.  New orthostatic hypotension: Now resolved continue current regimen he will go home off hydralazine.     DVT prophylaxis: Eliquis Family Communication:none Status is: Inpatient  Remains inpatient appropriate because:Hemodynamically unstable and IV treatments appropriate due to intensity of illness or inability to take PO   Dispo: The patient is from: Home              Anticipated d/c  is to: SNF              Anticipated d/c date is: 3 days              Patient currently is not medically stable to d/c.     Code Status:     Code Status Orders  (From admission, onward)         Start     Ordered   09/02/19 1119  Do not attempt resuscitation (DNR)  Continuous    Question Answer Comment  In the event of cardiac or respiratory ARREST Do not call a "code blue"   In the event of cardiac or respiratory ARREST Do not perform Intubation, CPR, defibrillation or ACLS   In the event of cardiac or respiratory ARREST Use medication by any route, position, wound care, and other measures to relive pain and suffering. May use oxygen, suction and manual treatment of airway obstruction as needed for comfort.      09/02/19 1121        Code Status History    Date Active Date Inactive Code Status Order ID Comments User Context   11/29/2018 1455 12/01/2018 2053 Full Code [id number]  Albertine Patricia, MD ED   06/15/2017 2214 06/20/2017 1940 Full Code [id number]  Marcie Mowers, MD ED   05/26/2017 1733 06/04/2017 2006 Full Code [id number]  Vashti Hey, MD ED   05/08/2017 1516 05/10/2017 1808 Full Code [id number]  Brenton Grills, PA-C ED   06/16/2015 1305 06/19/2015 1646 Full Code [id number]  Elgergawy, Silver Huguenin, MD ED   04/30/2015 2256 05/02/2015 1852 Full Code [id number]  Daleen Bo, Arie Sabina, MD Inpatient  04/22/2015 1504 04/23/2015 1457 Full Code [id number]  Constance Haw, MD Inpatient   11/04/2014 0853 11/04/2014 1454 Full Code [id number]  Leonie Man, MD Inpatient   08/23/2014 0117 08/26/2014 1646 Full Code [id number]  Rise Patience, MD Inpatient   Advance Care Planning Activity        IV Access:    Peripheral IV   Procedures and diagnostic studies:   CT ABDOMEN PELVIS W CONTRAST  Result Date: 09/11/2019 CLINICAL DATA:  Persistent leukocytosis, acute diverticulitis EXAM: CT ABDOMEN AND PELVIS WITH CONTRAST TECHNIQUE: Multidetector CT imaging of the  abdomen and pelvis was performed using the standard protocol following bolus administration of intravenous contrast. CONTRAST:  75m OMNIPAQUE IOHEXOL 300 MG/ML  SOLN COMPARISON:  09/08/2019, 09/02/2019 FINDINGS: Lower chest: There are small bilateral pleural effusions with minimal compressive bilateral lower lobe atelectasis. Hepatobiliary: Attenuation material within the gallbladder consistent with biliary sludge. No calcified gallstones or evidence of cholecystitis. No focal liver abnormalities. Pancreas: Unremarkable. No pancreatic ductal dilatation or surrounding inflammatory changes. Spleen: Normal in size without focal abnormality. Adrenals/Urinary Tract: There is bilateral renal cortical atrophy. Otherwise the kidneys enhance normally and symmetrically. No urinary tract calculi or obstruction. The bladder is unremarkable. The adrenals are normal. Stomach/Bowel: There is continued wall thickening of the sigmoid colon compatible with acute diverticulitis. There is a small pocket of extraluminal gas within the left lower quadrant measuring up to 2.2 cm in size. Inflammatory phlegmon adjacent to the sigmoid colon seen previously on image 62 has resolved in the interim. There is no fluid collection or abscess. There is continued mild distension of the small bowel with no evidence of high-grade obstruction, as oral contrast is seen throughout the colon to the level of the splenic flexure. Vascular/Lymphatic: Aortic atherosclerosis. No enlarged abdominal or pelvic lymph nodes. Reproductive: Prostate is unremarkable. Other: There is trace free fluid within the bilateral paracolic gutters. Mild residual mesenteric inflammatory change within the lower pelvis. There are bilateral fat containing inguinal hernias unchanged. Small hiatal hernia again noted. Musculoskeletal: There are no acute or destructive bony lesions. Reconstructed images demonstrate no additional findings. IMPRESSION: 1. Continued wall thickening of  the sigmoid colon compatible with acute diverticulitis. There is a small pocket of extraluminal gas within the left lower quadrant measuring up to 2.2 cm in size, consistent with localized perforation. Resolution of the inflammatory phlegmon seen within the lower pelvis on previous study, with no fluid collection or abscess seen today. 2. Persistent mild distension of the small bowel most consistent with ileus. No signs of high-grade obstruction. 3. Small bilateral pleural effusions. 4. Aortic Atherosclerosis (ICD10-I70.0). These results will be called to the ordering clinician or representative by the Radiologist Assistant, and communication documented in the PACS or [employer] Electronically Signed   By: MRanda NgoM.D.   On: 09/11/2019 15:24     Medical Consultants:    None.  Anti-Infectives:   Cefepime and Flagyl.  Subjective:    JLizabeth Leydentolerating his diet passing gas.  Objective:    Vitals:   09/12/19 1948 09/13/19 0014 09/13/19 0405 09/13/19 0416  BP: (!) 101/45 128/65 (!) 140/54 132/76  Pulse: 60 (!) 59 (!) 59 62  Resp: _0 Temp: 98 F (36.7 C) 97.7 F (36.5 C) 97.8 F (36.6 C) 98.2 F (36.8 C)  TempSrc: Oral Oral Oral Oral  SpO2:  97% 97% 97%  Weight:    96.3 kg  Height:       SpO2: 97 %  O2 Flow Rate (L/min): 0 L/min   Intake/Output Summary (Last 24 hours) at 09/13/2019 1128 Last data filed at 09/13/2019 0840 Gross per 24 hour  Intake 1393.7 ml  Output 975 ml  Net 418.7 ml   Filed Weights   09/11/19 0336 09/12/19 0408 09/13/19 0416  Weight: 93.8 kg 96.1 kg 96.3 kg    Exam: General exam: In no acute distress. Respiratory system: Good air movement and clear to auscultation. Cardiovascular system: S1 & S2 heard, RRR. No JVD. Gastrointestinal system: Positive bowel sounds soft nontender Extremities: No pedal edema. Skin: No rashes, lesions or ulcers  Data Reviewed:    Labs: Basic Metabolic Panel: Recent Labs  Lab  09/07/19 0328 09/07/19 0328 09/08/19 0351 09/08/19 0351 09/09/19 0718 09/11/19 0359  NA 135  --  138  --  138 137  K 4.1   < > 4.1   < > 3.9 4.0  CL 105  --  105  --  103 105  CO2 20*  --  23  --  21* 21*  GLUCOSE 126*  --  126*  --  114* 91  BUN 27*  --  31*  --  37* 38*  CREATININE 1.67*  --  1.87*  --  1.95* 1.93*  CALCIUM 7.8*  --  8.0*  --  8.0* 7.8*  MG 2.0  --   --   --  2.0  --    < > = values in this interval not displayed.   GFR Estimated Creatinine Clearance: 34.3 mL/min (A) (by C-G formula based on SCr of 1.93 mg/dL (H)). Liver Function Tests: Recent Labs  Lab 09/07/19 0328 09/08/19 0351  AST 27 28  ALT 25 25  ALKPHOS 39 36*  BILITOT 0.9 0.7  PROT 5.0* 4.9*  ALBUMIN 1.9* 1.9*   No results for input(s): LIPASE, AMYLASE in the last 168 hours. No results for input(s): AMMONIA in the last 168 hours. Coagulation profile No results for input(s): INR, PROTIME in the last 168 hours. COVID-19 Labs  No results for input(s): DDIMER, FERRITIN, LDH, CRP in the last 72 hours.  Lab Results  Component Value Date   [place] NEGATIVE 09/02/2019   SARSCOV2NAA NOT DETECTED 11/29/2018    CBC: Recent Labs  Lab 09/07/19 0328 09/07/19 0328 09/08/19 0351 09/09/19 0718 09/10/19 0344 09/11/19 0359 09/13/19 0515  WBC 12.9*   < > 12.0* 12.5* 13.4* 12.6* 14.5*  NEUTROABS 8.5*  --  7.3 7.9* 8.5* 8.1*  --   HGB 12.4*   < > 11.8* 11.9* 11.6* 11.4* 11.7*  HCT 37.9*   < > 36.9* 37.1* 36.0* 35.9* 36.7*  MCV 91.1   < > 91.8 92.8 93.8 92.8 92.4  PLT 311   < > 336 366 382 378 439*   < > = values in this interval not displayed.   Cardiac Enzymes: No results for input(s): CKTOTAL, CKMB, CKMBINDEX, TROPONINI in the last 168 hours. BNP (last 3 results) No results for input(s): PROBNP in the last 8760 hours. CBG: Recent Labs  Lab 09/12/19 1230  GLUCAP 177*   D-Dimer: No results for input(s): DDIMER in the last 72 hours. Hgb A1c: No results for input(s): HGBA1C in the  last 72 hours. Lipid Profile: No results for input(s): CHOL, HDL, LDLCALC, TRIG, CHOLHDL, LDLDIRECT in the last 72 hours. Thyroid function studies: No results for input(s): TSH, T4TOTAL, T3FREE, THYROIDAB in the last 72 hours.  Invalid input(s): FREET3 Anemia work up: No results for input(s): VITAMINB12, FOLATE, FERRITIN, TIBC,  IRON, RETICCTPCT in the last 72 hours. Sepsis Labs: Recent Labs  Lab 09/07/19 0824 09/08/19 0351 09/09/19 0718 09/10/19 0344 09/11/19 0359 09/13/19 0515  WBC  --    < > 12.5* 13.4* 12.6* 14.5*  LATICACIDVEN 1.3  --   --   --   --   --    < > = values in this interval not displayed.   Microbiology No results found for this or any previous visit (from the past 240 hour(s)).   Medications:   . atorvastatin  20 mg Oral QPM  . carvedilol  25 mg Oral BID WC  . docusate sodium  100 mg Oral BID  . furosemide  40 mg Oral Daily  . isosorbide mononitrate  30 mg Oral QPM  . mexiletine  300 mg Oral BID  . pantoprazole  40 mg Oral Daily  . sodium chloride flush  3 mL Intravenous Q12H  . sotalol  80 mg Oral BID   Continuous Infusions: . ceFEPime (MAXIPIME) IV 2 g (09/13/19 1001)  . heparin 800 Units/hr (09/13/19 0500)  . metronidazole 500 mg (09/13/19 2993)      LOS: 11 days   Charlynne Cousins  [facility]  09/13/2019, 11:28 AM

## 2019-09-14 ENCOUNTER — Encounter (HOSPITAL_COMMUNITY)
Admission: EM | Disposition: A | Discharge status: Skilled Nursing Facility | Payer: Self-pay | Source: Home / Self Care | Attending: Internal Medicine

## 2019-09-14 ENCOUNTER — Inpatient Hospital Stay (HOSPITAL_COMMUNITY): Payer: Medicare HMO

## 2019-09-14 DIAGNOSIS — R609 Edema, unspecified: Secondary | ICD-10-CM

## 2019-09-14 LAB — APTT
aPTT: 49 seconds — ABNORMAL HIGH (ref 24–36)
aPTT: 79 seconds — ABNORMAL HIGH (ref 24–36)

## 2019-09-14 LAB — CBC
HCT: 34.3 % — ABNORMAL LOW (ref 39.0–52.0)
Hemoglobin: 11.1 g/dL — ABNORMAL LOW (ref 13.0–17.0)
MCH: 29.7 pg (ref 26.0–34.0)
MCHC: 32.4 g/dL (ref 30.0–36.0)
MCV: 91.7 fL (ref 80.0–100.0)
Platelets: 398 10*3/uL (ref 150–400)
RBC: 3.74 MIL/uL — ABNORMAL LOW (ref 4.22–5.81)
RDW: 15.3 % (ref 11.5–15.5)
WBC: 11.1 10*3/uL — ABNORMAL HIGH (ref 4.0–10.5)
nRBC: 0 % (ref 0.0–0.2)

## 2019-09-14 LAB — PHOSPHORUS: Phosphorus: 2.4 mg/dL — ABNORMAL LOW (ref 2.5–4.6)

## 2019-09-14 LAB — BASIC METABOLIC PANEL
Anion gap: 9 (ref 5–15)
BUN: 21 mg/dL (ref 8–23)
CO2: 19 mmol/L — ABNORMAL LOW (ref 22–32)
Calcium: 7.7 mg/dL — ABNORMAL LOW (ref 8.9–10.3)
Chloride: 106 mmol/L (ref 98–111)
Creatinine, Ser: 1.58 mg/dL — ABNORMAL HIGH (ref 0.61–1.24)
GFR calc Af Amer: 46 mL/min — ABNORMAL LOW (ref >60)
GFR calc non Af Amer: 40 mL/min — ABNORMAL LOW (ref >60)
Glucose, Bld: 124 mg/dL — ABNORMAL HIGH (ref 70–99)
Potassium: 4.3 mmol/L (ref 3.5–5.1)
Sodium: 134 mmol/L — ABNORMAL LOW (ref 135–145)

## 2019-09-14 LAB — MAGNESIUM: Magnesium: 2 mg/dL (ref 1.7–2.4)

## 2019-09-14 LAB — PREALBUMIN: Prealbumin: 14.3 mg/dL — ABNORMAL LOW (ref 18–38)

## 2019-09-14 LAB — HEPARIN LEVEL (UNFRACTIONATED): Heparin Unfractionated: 0.47 IU/mL (ref 0.30–0.70)

## 2019-09-14 SURGERY — LEFT HEART CATH AND CORONARY ANGIOGRAPHY
Anesthesia: LOCAL

## 2019-09-14 MED ORDER — BOOST / RESOURCE BREEZE PO LIQD CUSTOM
1.0000 | Freq: Three times a day (TID) | ORAL | Status: DC
  Administered 2019-09-14 – 2019-09-23 (×24): 1 via ORAL

## 2019-09-14 NOTE — Progress Notes (Signed)
[facility] PROGRESS NOTE    Progress Note  [name]  XAJ:[id number] DOB: 01-03-1933 DOA: 09/01/2019 PCP: [name], MD     Brief Narrative:   [name] is an 86 y.o. male admitted to the hospital with acute diverticulitis and nonsustained V. tach and ileus.  NG tube was placed and he has been treated with conservative management.  His NG tube was clamped on 09/08/2019 he was only putting about 500 cc.  X-ray was obtained that showed dilated loops.  Assessment/Plan:   Acute severe diverticulitis/ileus: He was started empirically on IV cefepime and Flagyl and empiric antibiotic he has remained afebrile leukocytosis improving. Repeated CT scan showed possible microperforation with gas collection. Appreciate surgery's assistance they plan to repeat the CT scan later this week. He is tolerating his diet and having regular bowel movements.  Chronic combined systolic and diastolic heart failure: Continue daily standing weights, strict I's and O's, restrict his fluids to 1500 cc resume his home dose of Lasix. Left lower extremity is more swollen than the right, will Doppler that leg to make sure does not have the DVT which is unlikely.  Chronic hyponatremia and metabolic acidosis: Worsened this morning restrict his fluids, continue IV Lasix.  Chronic kidney disease stage IV: Creatinine remained at baseline. With a baseline creatinine of 1.9-2.3.  Chronic atrial fibrillation: Continue Coreg, Eliquis held and started on IV heparin, and the unfortunate circumstances surgical intervention is needed. Chads Vasc score greater than 2.  Urinary retention: Appears to be resolved.  New orthostatic hypotension: Now resolved continue current regimen he will go home off hydralazine.  Severe protein caloric malnutrition: His prealbumin is low, getting boost supplements.  Recheck.     DVT prophylaxis: Eliquis Family Communication:none Status is: Inpatient  Remains  inpatient appropriate because:Hemodynamically unstable and IV treatments appropriate due to intensity of illness or inability to take PO   Dispo: The patient is from: Home              Anticipated d/c is to: SNF              Anticipated d/c date is: 3 days              Patient currently is not medically stable to d/c.     Code Status:     Code Status Orders  (From admission, onward)         Start     Ordered   09/02/19 1119  Do not attempt resuscitation (DNR)  Continuous    Question Answer Comment  In the event of cardiac or respiratory ARREST Do not call a "code blue"   In the event of cardiac or respiratory ARREST Do not perform Intubation, CPR, defibrillation or ACLS   In the event of cardiac or respiratory ARREST Use medication by any route, position, wound care, and other measures to relive pain and suffering. May use oxygen, suction and manual treatment of airway obstruction as needed for comfort.      09/02/19 1121        Code Status History    Date Active Date Inactive Code Status Order ID Comments User Context   11/29/2018 1455 12/01/2018 2053 Full Code [id number]  Albertine Patricia, MD ED   06/15/2017 2214 06/20/2017 1940 Full Code [id number]  Marcie Mowers, MD ED   05/26/2017 1733 06/04/2017 2006 Full Code [id number]  Vashti Hey, MD ED   05/08/2017 1516 05/10/2017 1808 Full Code [id number]  York Grice  [facility] ED   06/16/2015 1305 06/19/2015 1646 Full Code [id number]  Elgergawy, Silver Huguenin, MD ED   04/30/2015 2256 05/02/2015 1852 Full Code [id number]  Kinnie Feil, MD Inpatient   04/22/2015 1504 04/23/2015 1457 Full Code [id number]  Constance Haw, MD Inpatient   11/04/2014 0853 11/04/2014 1454 Full Code [id number]  Leonie Man, MD Inpatient   08/23/2014 0117 08/26/2014 1646 Full Code [id number]  Rise Patience, MD Inpatient   Advance Care Planning Activity        IV Access:    Peripheral IV   Procedures and diagnostic studies:    No results found.   Medical Consultants:    None.  Anti-Infectives:   Cefepime and Flagyl.  Subjective:    [name] had a bowel movement tolerating his diet, he has mild abdominal pain.  Objective:    Vitals:   09/13/19 2026 09/14/19 0025 09/14/19 0521 09/14/19 0747  BP: (!) 130/54 (!) 125/53 133/63 (!) 137/94  Pulse: 67 74 61 60  Resp: _0 Temp: 98.6 F (37 C) 98.3 F (36.8 C) 98.5 F (36.9 C) 98.5 F (36.9 C)  TempSrc: Oral Oral Oral Oral  SpO2: 97% 97% 96% 99%  Weight:   94 kg   Height:       SpO2: 99 % O2 Flow Rate (L/min): 0 L/min   Intake/Output Summary (Last 24 hours) at 09/14/2019 0948 Last data filed at 09/14/2019 0827 Gross per 24 hour  Intake 1533.01 ml  Output 1076 ml  Net 457.01 ml   Filed Weights   09/12/19 0408 09/13/19 0416 09/14/19 0521  Weight: 96.1 kg 96.3 kg 94 kg    Exam: General exam: In no acute distress. Respiratory system: Good air movement and clear to auscultation. Cardiovascular system: S1 & S2 heard, RRR. No JVD. Gastrointestinal system: Abdomen is nondistended, soft and nontender.  Extremities: Left lower extremity more swollen than the right. Skin: No rashes, lesions or ulcers  Data Reviewed:    Labs: Basic Metabolic Panel: Recent Labs  Lab 09/08/19 0351 09/08/19 0351 09/09/19 0718 09/09/19 0718 09/11/19 0359 09/14/19 0537  NA 138  --  138  --  137 134*  K 4.1   < > 3.9   < > 4.0 4.3  CL 105  --  103  --  105 106  CO2 23  --  21*  --  21* 19*  GLUCOSE 126*  --  114*  --  91 124*  BUN 31*  --  37*  --  38* 21  CREATININE 1.87*  --  1.95*  --  1.93* 1.58*  CALCIUM 8.0*  --  8.0*  --  7.8* 7.7*  MG  --   --  2.0  --   --  2.0  PHOS  --   --   --   --   --  2.4*   < > = values in this interval not displayed.   GFR Estimated Creatinine Clearance: 41.4 mL/min (A) (by C-G formula based on SCr of 1.58 mg/dL (H)). Liver Function Tests: Recent Labs  Lab 09/08/19 0351  AST 28  ALT 25   ALKPHOS 36*  BILITOT 0.7  PROT 4.9*  ALBUMIN 1.9*   No results for input(s): LIPASE, AMYLASE in the last 168 hours. No results for input(s): AMMONIA in the last 168 hours. Coagulation profile No results for input(s): INR, PROTIME in the last 168 hours. COVID-19 Labs  No results for input(s): DDIMER,  FERRITIN, LDH, CRP in the last 72 hours.  Lab Results  Component Value Date   [place] NEGATIVE 09/02/2019   SARSCOV2NAA NOT DETECTED 11/29/2018    CBC: Recent Labs  Lab 09/08/19 0351 09/08/19 0351 09/09/19 0718 09/10/19 0344 09/11/19 0359 09/13/19 0515 09/14/19 0537  WBC 12.0*   < > 12.5* 13.4* 12.6* 14.5* 11.1*  NEUTROABS 7.3  --  7.9* 8.5* 8.1*  --   --   HGB 11.8*   < > 11.9* 11.6* 11.4* 11.7* 11.1*  HCT 36.9*   < > 37.1* 36.0* 35.9* 36.7* 34.3*  MCV 91.8   < > 92.8 93.8 92.8 92.4 91.7  PLT 336   < > 366 382 378 439* 398   < > = values in this interval not displayed.   Cardiac Enzymes: No results for input(s): CKTOTAL, CKMB, CKMBINDEX, TROPONINI in the last 168 hours. BNP (last 3 results) No results for input(s): PROBNP in the last 8760 hours. CBG: Recent Labs  Lab 09/12/19 1230  GLUCAP 177*   D-Dimer: No results for input(s): DDIMER in the last 72 hours. Hgb A1c: No results for input(s): HGBA1C in the last 72 hours. Lipid Profile: No results for input(s): CHOL, HDL, LDLCALC, TRIG, CHOLHDL, LDLDIRECT in the last 72 hours. Thyroid function studies: No results for input(s): TSH, T4TOTAL, T3FREE, THYROIDAB in the last 72 hours.  Invalid input(s): FREET3 Anemia work up: No results for input(s): VITAMINB12, FOLATE, FERRITIN, TIBC, IRON, RETICCTPCT in the last 72 hours. Sepsis Labs: Recent Labs  Lab 09/10/19 0344 09/11/19 0359 09/13/19 0515 09/14/19 0537  WBC 13.4* 12.6* 14.5* 11.1*   Microbiology No results found for this or any previous visit (from the past 240 hour(s)).   Medications:   . atorvastatin  20 mg Oral QPM  . carvedilol  25 mg  Oral BID WC  . docusate sodium  100 mg Oral BID  . feeding supplement  1 Container Oral TID BM  . furosemide  40 mg Oral Daily  . isosorbide mononitrate  30 mg Oral QPM  . mexiletine  300 mg Oral BID  . pantoprazole  40 mg Oral Daily  . sodium chloride flush  3 mL Intravenous Q12H  . sotalol  80 mg Oral BID   Continuous Infusions: . ceFEPime (MAXIPIME) IV 2 g (09/14/19 0839)  . heparin 900 Units/hr (09/14/19 0827)  . metronidazole Stopped (09/14/19 0656)      LOS: 12 days   Charlynne Cousins  [facility]  09/14/2019, 9:48 AM

## 2019-09-14 NOTE — Progress Notes (Signed)
ANTICOAGULATION CONSULT NOTE - Initial Consult  Pharmacy Consult for heparin Indication: atrial fibrillation  Allergies  Allergen Reactions  . Novocain [Procaine] Swelling    SWELLING REACTION UNSPECIFIED   . Amiodarone Nausea And Vomiting    Patient Measurements: Height: 6' (182.9 cm) Weight: 94 kg (207 lb 3.7 oz)(scale b) IBW/kg (Calculated) : 77.6 Heparin Dosing Weight: 92kg  Vital Signs: Temp: 98.5 F (36.9 C) (06/07 0521) Temp Source: Oral (06/07 0521) BP: 133/63 (06/07 0521) Pulse Rate: 61 (06/07 0521)  Labs: Recent Labs    09/12/19 1628 09/13/19 0515 09/14/19 0537  HGB  --  11.7* 11.1*  HCT  --  36.7* 34.3*  PLT  --  439* 398  APTT 124* 84* 49*  HEPARINUNFRC  --  0.74* 0.47  CREATININE  --   --  1.58*    Estimated Creatinine Clearance: 41.4 mL/min (A) (by C-G formula based on SCr of 1.58 mg/dL (H)).     Assessment: 73 yoM on apixaban PTA for hx Afib (CHADS2VASc = 6) admitted with recurrent diverticulitis. Repeat CT scan showed localized perforation. Pharmacy asked to transition apixaban to IV heparin in case procedures needed. Last dose of apixaban was 6/4 at 0931. Will dose via aPTTs until DOAC clears. Renal function has been stable.  aPTT this morning is subtherapeutic at 49 seconds on 800 units/hr. H&H and platelets stable. No signs of bleeding noted. Heparin level continues to be falsely elevated due to Eliquis.    Goal of Therapy:  Heparin level 0.3-0.7 units/ml aPTT 66-102 seconds Monitor platelets by anticoagulation protocol: Yes   Plan:  -Increase heparin infusion to 900 units/hour -F/u 8 hr aPTT  -Daily aPTT, heparin level, and CBC -Monitor for signs of bleeding -Follow up surgery plans and when to transition back to [place], PharmD, BCPS, [place] Pharmacist  Please check AMION for all [place] phone numbers After 10:00 PM, call [place]

## 2019-09-14 NOTE — Progress Notes (Signed)
[place] for heparin Indication: atrial fibrillation  Allergies  Allergen Reactions  . Novocain [Procaine] Swelling    SWELLING REACTION UNSPECIFIED   . Amiodarone Nausea And Vomiting    Patient Measurements: Height: 6' (182.9 cm) Weight: 94 kg (207 lb 3.7 oz)(scale b) IBW/kg (Calculated) : 77.6 Heparin Dosing Weight: 92kg  Vital Signs: Temp: 97.7 F (36.5 C) (06/07 1202) Temp Source: Oral (06/07 1202) BP: 117/52 (06/07 1202) Pulse Rate: 59 (06/07 1202)  Labs: Recent Labs    09/13/19 0515 09/14/19 0537 09/14/19 1530  HGB 11.7* 11.1*  --   HCT 36.7* 34.3*  --   PLT 439* 398  --   APTT 84* 49* 79*  HEPARINUNFRC 0.74* 0.47  --   CREATININE  --  1.58*  --     Estimated Creatinine Clearance: 41.4 mL/min (A) (by C-G formula based on SCr of 1.58 mg/dL (H)).     Assessment: 59 yoM on apixaban PTA for hx Afib (CHADS2VASc = 6) admitted with recurrent diverticulitis. Repeat CT scan showed localized perforation. Pharmacy asked to transition apixaban to IV heparin in case procedures needed. Last dose of apixaban was 6/4 at 0931. Will dose via aPTTs until DOAC clears. Renal function has been stable.  PTT now therapeutic at 79 seconds  Goal of Therapy:  Heparin level 0.3-0.7 units/ml aPTT 66-102 seconds Monitor platelets by anticoagulation protocol: Yes   Plan:  -Continue heparin at 900 units / hr -Daily aPTT, heparin level, and CBC -Monitor for signs of bleeding -Follow up surgery plans and when to transition back to Eliquis   Thank you Anette Guarneri, PharmD  Please check AMION for all [place] phone numbers After 10:00 PM, call [place]

## 2019-09-14 NOTE — Progress Notes (Signed)
[facility] Progress Note     Subjective: CC-  Large BM yesterday, passing some flatus this morning. Tolerating fulls liquids but still feels bloated after he eats. States that he has intermittent sharp RLQ pains. Denies n/v. WBC down 11.1, afebrile.  Objective: Vital signs in last 24 hours: Temp:  [98.3 F (36.8 C)-98.6 F (37 C)] 98.5 F (36.9 C) (06/07 0747) Pulse Rate:  [60-74] 60 (06/07 0747) Resp:  [16-20] 19 (06/07 0747) BP: (125-137)/(53-94) 137/94 (06/07 0747) SpO2:  [96 %-99 %] 99 % (06/07 0747) Weight:  [94 kg] 94 kg (06/07 0521) Last BM Date: 09/13/19  Intake/Output from previous day: 06/06 0701 - 06/07 0700 In: 1131.2 [P.O.:720; I.V.:118.4; IV Piggyback:292.9] Out: 1076 [Urine:1075; Stool:1] Intake/Output this shift: No intake/output data recorded.  PE: Gen: Alert, NAD, pleasant Card: RRR Pulm: CTAB anteriorly, no W/R/R, rate and effort normal Abd: Soft,mild distension, +BS, no HSM,nontender Skin: no rashes noted, warm and dry   Lab Results:  Recent Labs    09/13/19 0515 09/14/19 0537  WBC 14.5* 11.1*  HGB 11.7* 11.1*  HCT 36.7* 34.3*  PLT 439* 398   BMET Recent Labs    09/14/19 0537  NA 134*  K 4.3  CL 106  CO2 19*  GLUCOSE 124*  BUN 21  CREATININE 1.58*  CALCIUM 7.7*   PT/INR No results for input(s): LABPROT, INR in the last 72 hours. CMP     Component Value Date/Time   NA 134 (L) 09/14/2019 0537   NA 138 05/23/2018 1240   K 4.3 09/14/2019 0537   CL 106 09/14/2019 0537   CO2 19 (L) 09/14/2019 0537   GLUCOSE 124 (H) 09/14/2019 0537   BUN 21 09/14/2019 0537   BUN 27 05/23/2018 1240   CREATININE 1.58 (H) 09/14/2019 0537   CREATININE 1.72 (H) 04/14/2015 0942   CALCIUM 7.7 (L) 09/14/2019 0537   PROT 4.9 (L) 09/08/2019 0351   PROT 6.3 04/23/2017 1048   ALBUMIN 1.9 (L) 09/08/2019 0351   ALBUMIN 3.9 04/23/2017 1048   AST 28 09/08/2019 0351   ALT 25 09/08/2019 0351   ALKPHOS 36 (L) 09/08/2019 0351   BILITOT 0.7  09/08/2019 0351   BILITOT 0.8 04/23/2017 1048   GFRNONAA 40 (L) 09/14/2019 0537   GFRAA 46 (L) 09/14/2019 0537   Lipase     Component Value Date/Time   LIPASE 22 09/01/2019 1835       Studies/Results: No results found.  Anti-infectives: Anti-infectives (From admission, onward)   Start     Dose/Rate Route Frequency Ordered Stop   09/05/19 2200  ceFEPIme (MAXIPIME) 2 g in sodium chloride 0.9 % 100 mL IVPB     2 g 200 mL/hr over 30 Minutes Intravenous Every 12 hours 09/05/19 0847 09/15/19 2359   09/03/19 0800  ceFEPIme (MAXIPIME) 2 g in sodium chloride 0.9 % 100 mL IVPB  Status:  Discontinued     2 g 200 mL/hr over 30 Minutes Intravenous Every 24 hours 09/02/19 0725 09/05/19 0847   09/02/19 1500  metroNIDAZOLE (FLAGYL) IVPB 500 mg     500 mg 100 mL/hr over 60 Minutes Intravenous Every 8 hours 09/02/19 1121 09/15/19 2359   09/02/19 0730  ceFEPIme (MAXIPIME) 2 g in sodium chloride 0.9 % 100 mL IVPB     2 g 200 mL/hr over 30 Minutes Intravenous  Once 09/02/19 0721 09/02/19 0900   09/02/19 0730  metroNIDAZOLE (FLAGYL) IVPB 500 mg     500 mg 100 mL/hr over 60 Minutes Intravenous  Once 09/02/19 0721 09/02/19 1056       Assessment/Plan CHF - EF 30-35% CAD, H/o CABG AS s/p AVR  HTN CKD-IV Asthma Atrial fibrillation on eliquis (last dose 6/4 in AM) Orthostatic hypotension Code status DNR  Acute sigmoid diverticulitis with microperforation - 4th bout - CT 6/4 showed continued wall thickening of the sigmoid colon compatible with acute diverticulitisand asmall pocket of extraluminal gas within the left lower quadrant measuring up to 2.2 cm in size consistent with localized perforation; resolution of the inflammatory phlegmon seen within the lower pelvis on previous study, with no fluid collection or abscess seen today; mild ileus  ID -maxipime/flagyl 5/26>> FEN -IVF, FLD, Boost VTE -IV heparin Foley -none Follow up -TBD  Plan: Still having some intermittent  abdominal pain, but he is nontender on exam, having bowel function, and WBC down today. Continue IV antibiotics and full liquids. Add Boost. Prealbumin is pending. Continue therapies. Plan for repeat CT scan this week. Continue to hold eliquis.   LOS: 12 days    [facility] 09/14/2019, 8:13 AM Please see Amion for pager number during day hours 7:00am-4:30pm

## 2019-09-14 NOTE — Progress Notes (Signed)
VASCULAR LAB PRELIMINARY  PRELIMINARY  PRELIMINARY  PRELIMINARY  Bilateral lower extremity venous duplex completed.    Preliminary report:  See CV proc for preliminary results.  Leanthony Rhett, RVT 09/14/2019, 6:11 PM

## 2019-09-14 NOTE — Progress Notes (Signed)
Physical Therapy Treatment Patient Details Name: [name] MRN: [id number] DOB: [birth year] Today's Date: 09/14/2019    History of Present Illness 86 yo male presented to ED 09/02/19 with onset of generalized weakness after a flare of his diverticulitis with metabolic acidosis, acute urinary retention.  No perf on CT, severe diverticulitis on non-contrast CT 09/02/19. Pt currently being monitored for recurring Vtach that started 5/29. PMHx: TAVR, a-fib, CKD4, CHF, AICD, HTN, CAD with CABG, diverticulitis.    PT Comments    Pt admitted with above diagnosis. Pt was orthostatic upon sitting which worsened upon standing.  Pt only able to transfer OOB to chair.   Pt currently with functional limitations due to the deficits listed below (see PT Problem List). Pt will benefit from skilled PT to increase their independence and safety with mobility to allow discharge to the venue listed below.     09/14/19 1500  Orthostatic Lying   BP- Lying 131/60  Pulse- Lying 60  Orthostatic Sitting  BP- Sitting 109/71  Pulse- Sitting 78  Orthostatic Standing at 0 minutes  BP- Standing at 0 minutes 94/81  Pulse- Standing at 0 minutes 99  Orthostatic Standing at 3 minutes  BP- Standing at 3 minutes  (would not register)  BP once pt in chair 105/51, 68 bpm   Follow Up Recommendations  Home health PT;Supervision/Assistance - 24 hour(may need to update if pt continues to have medical issues)     Equipment Recommendations  Other (comment)(TBD)    Recommendations for Other Services       Precautions / Restrictions Precautions Precautions: Fall;Other (comment) Restrictions Weight Bearing Restrictions: No    Mobility  Bed Mobility   Bed Mobility: Supine to Sit     Supine to sit: Min assist Sit to supine: Min assist   General bed mobility comments: Assisted for stability as pt took incr time and kept having posterior lean  Transfers Overall transfer level: Needs assistance Equipment used:  Rolling walker (2 wheeled) Transfers: Sit to/from Stand Sit to Stand: Min assist;Min guard;From elevated surface         General transfer comment: Pt orthostatic supine to sit with dizziness reported.  Was able to transfer to 3N1 to have BM and then got to recliner with BP not quite as low but still orthostatic with pt being dizzy.   Ambulation/Gait                 Stairs             Wheelchair Mobility    Modified Rankin (Stroke Patients Only)       Balance Overall balance assessment: Needs assistance Sitting-balance support: Feet supported;No upper extremity supported Sitting balance-Leahy Scale: Fair Sitting balance - Comments: Pt reported feeling dizzy at EOB   Standing balance support: Bilateral upper extremity supported;During functional activity Standing balance-Leahy Scale: Poor Standing balance comment: Pt relies on UE support but light support needed with RW.                             Cognition Arousal/Alertness: Awake/alert Behavior During Therapy: WFL for tasks assessed/performed Overall Cognitive Status: Within Functional Limits for tasks assessed                                        Exercises      General Comments General comments (skin  integrity, edema, etc.): See orthostatic VS.       Pertinent Vitals/Pain Pain Assessment: Faces Faces Pain Scale: Hurts little more Pain Location: abdomen with movement Pain Descriptors / Indicators: Discomfort;Guarding Pain Intervention(s): Limited activity within patient's tolerance;Monitored during session;Repositioned    Home Living                      Prior Function            PT Goals (current goals can now be found in the care plan section) Acute Rehab PT Goals Patient Stated Goal: to walk and get home Progress towards PT goals: Not progressing toward goals - comment(orthostasis continues)    Frequency    Min 3X/week      PT Plan Current  plan remains appropriate    Co-evaluation              AM-PAC PT "6 Clicks" Mobility   Outcome Measure  Help needed turning from your back to your side while in a flat bed without using bedrails?: A Little Help needed moving from lying on your back to sitting on the side of a flat bed without using bedrails?: A Little Help needed moving to and from a bed to a chair (including a wheelchair)?: A Little Help needed standing up from a chair using your arms (e.g., wheelchair or bedside chair)?: A Lot Help needed to walk in hospital room?: A Lot Help needed climbing 3-5 steps with a railing? : A Lot 6 Click Score: 15    End of Session Equipment Utilized During Treatment: Gait belt Activity Tolerance: Patient limited by fatigue;Other (comment)(limited by dizziness) Patient left: with call bell/phone within reach;in chair;with chair alarm set Nurse Communication: Mobility status PT Visit Diagnosis: Unsteadiness on feet (R26.81);Muscle weakness (generalized) (M62.81);Difficulty in walking, not elsewhere classified (R26.2);Ataxic gait (R26.0);Pain Pain - Right/Left: (abdomen) Pain - part of body: (abdomen)     Time: 2575-0518 PT Time Calculation (min) (ACUTE ONLY): 21 min  Charges:  $Therapeutic Activity: 8-22 mins                     [name],PT Acute Rehabilitation Services Pager:  [phone]  Office:  [place] 09/14/2019, 3:11 PM

## 2019-09-14 NOTE — Progress Notes (Signed)
09/14/19 1500  Orthostatic Lying   BP- Lying 131/60  Pulse- Lying 60  Orthostatic Sitting  BP- Sitting 109/71  Pulse- Sitting 78  Orthostatic Standing at 0 minutes  BP- Standing at 0 minutes 94/81  Pulse- Standing at 0 minutes 99  Orthostatic Standing at 3 minutes  BP- Standing at 3 minutes  (would not register)  BP once pt in chair 105/51, 68 bpm  Vianney Kopecky W,PT Acute Rehabilitation Services Pager:  [phone]  Office:  [phone]

## 2019-09-15 LAB — CBC
HCT: 34.2 % — ABNORMAL LOW (ref 39.0–52.0)
Hemoglobin: 11 g/dL — ABNORMAL LOW (ref 13.0–17.0)
MCH: 29.6 pg (ref 26.0–34.0)
MCHC: 32.2 g/dL (ref 30.0–36.0)
MCV: 92.2 fL (ref 80.0–100.0)
Platelets: 434 10*3/uL — ABNORMAL HIGH (ref 150–400)
RBC: 3.71 MIL/uL — ABNORMAL LOW (ref 4.22–5.81)
RDW: 15.5 % (ref 11.5–15.5)
WBC: 10.9 10*3/uL — ABNORMAL HIGH (ref 4.0–10.5)
nRBC: 0 % (ref 0.0–0.2)

## 2019-09-15 LAB — BASIC METABOLIC PANEL
Anion gap: 9 (ref 5–15)
BUN: 19 mg/dL (ref 8–23)
CO2: 19 mmol/L — ABNORMAL LOW (ref 22–32)
Calcium: 7.8 mg/dL — ABNORMAL LOW (ref 8.9–10.3)
Chloride: 107 mmol/L (ref 98–111)
Creatinine, Ser: 1.66 mg/dL — ABNORMAL HIGH (ref 0.61–1.24)
GFR calc Af Amer: 43 mL/min — ABNORMAL LOW (ref >60)
GFR calc non Af Amer: 37 mL/min — ABNORMAL LOW (ref >60)
Glucose, Bld: 127 mg/dL — ABNORMAL HIGH (ref 70–99)
Potassium: 3.8 mmol/L (ref 3.5–5.1)
Sodium: 135 mmol/L (ref 135–145)

## 2019-09-15 LAB — APTT
aPTT: 56 seconds — ABNORMAL HIGH (ref 24–36)
aPTT: 101 seconds — ABNORMAL HIGH (ref 24–36)

## 2019-09-15 LAB — HEPARIN LEVEL (UNFRACTIONATED): Heparin Unfractionated: 0.42 IU/mL (ref 0.30–0.70)

## 2019-09-15 NOTE — Progress Notes (Signed)
[facility] Progress Note     Subjective: CC-  States that he was not doing well physically or mentally last night, however this morning he is feeling much better. He has had no sharp RLQ pain likes he did yesterday. Tolerating small amount of full liquids. Denies n/v but he doesn't have much of an appetite and the full liquids aren't very good. Less bloating after PO intake. BM yesterday. WBC down 10.9, afebrile.   Objective: Vital signs in last 24 hours: Temp:  [97.6 F (36.4 C)-98.2 F (36.8 C)] 97.6 F (36.4 C) (06/08 0816) Pulse Rate:  [58-79] 76 (06/08 0816) Resp:  [17-20] 20 (06/08 0816) BP: (117-144)/(52-91) 130/54 (06/08 0816) SpO2:  [95 %-99 %] 99 % (06/08 0816) Weight:  [94.2 kg] 94.2 kg (06/08 0300) Last BM Date: 09/14/19  Intake/Output from previous day: 06/07 0701 - 06/08 0700 In: 1355.5 [P.O.:663; I.V.:192.5; IV Piggyback:500] Out: 2250 [Urine:2250] Intake/Output this shift: Total I/O In: 480 [P.O.:480] Out: 550 [Urine:550]  PE: Gen: Alert, NAD, pleasant Card: RRR Pulm: CTAB anteriorly, no W/R/R, rate and effort normal Abd: Soft,mild distension, +BS, no HSM,nontender Skin: no rashes noted, warm and dry    Lab Results:  Recent Labs    09/14/19 0537 09/15/19 0647  WBC 11.1* 10.9*  HGB 11.1* 11.0*  HCT 34.3* 34.2*  PLT 398 434*   BMET Recent Labs    09/14/19 0537 09/15/19 0647  NA 134* 135  K 4.3 3.8  CL 106 107  CO2 19* 19*  GLUCOSE 124* 127*  BUN 21 19  CREATININE 1.58* 1.66*  CALCIUM 7.7* 7.8*   PT/INR No results for input(s): LABPROT, INR in the last 72 hours. CMP     Component Value Date/Time   NA 135 09/15/2019 0647   NA 138 05/23/2018 1240   K 3.8 09/15/2019 0647   CL 107 09/15/2019 0647   CO2 19 (L) 09/15/2019 0647   GLUCOSE 127 (H) 09/15/2019 0647   BUN 19 09/15/2019 0647   BUN 27 05/23/2018 1240   CREATININE 1.66 (H) 09/15/2019 0647   CREATININE 1.72 (H) 04/14/2015 0942   CALCIUM 7.8 (L) 09/15/2019  0647   PROT 4.9 (L) 09/08/2019 0351   PROT 6.3 04/23/2017 1048   ALBUMIN 1.9 (L) 09/08/2019 0351   ALBUMIN 3.9 04/23/2017 1048   AST 28 09/08/2019 0351   ALT 25 09/08/2019 0351   ALKPHOS 36 (L) 09/08/2019 0351   BILITOT 0.7 09/08/2019 0351   BILITOT 0.8 04/23/2017 1048   GFRNONAA 37 (L) 09/15/2019 0647   GFRAA 43 (L) 09/15/2019 0647   Lipase     Component Value Date/Time   LIPASE 22 09/01/2019 1835       Studies/Results: VAS [place] LOWER EXTREMITY VENOUS (DVT)  Result Date: 09/14/2019  Lower Venous DVT Study Indications: Edema.  Comparison Study: No prior study on file for comparison Performing Technologist: Sharion Dove RVS  Examination Guidelines: A complete evaluation includes B-mode imaging, spectral Doppler, color Doppler, and power Doppler as needed of all accessible portions of each vessel. Bilateral testing is considered an integral part of a complete examination. Limited examinations for reoccurring indications may be performed as noted. The reflux portion of the exam is performed with the patient in reverse Trendelenburg.  +---------+---------------+---------+-----------+----------+--------------+ RIGHT    CompressibilityPhasicitySpontaneityPropertiesThrombus Aging +---------+---------------+---------+-----------+----------+--------------+ CFV      Full           Yes      Yes                                 +---------+---------------+---------+-----------+----------+--------------+  SFJ      Full                                                        +---------+---------------+---------+-----------+----------+--------------+ FV Prox  Full                                                        +---------+---------------+---------+-----------+----------+--------------+ FV Mid   Full                                                        +---------+---------------+---------+-----------+----------+--------------+ FV DistalFull                                                         +---------+---------------+---------+-----------+----------+--------------+ PFV      Full                                                        +---------+---------------+---------+-----------+----------+--------------+ POP      Full           Yes      Yes                                 +---------+---------------+---------+-----------+----------+--------------+ PTV      Full                                                        +---------+---------------+---------+-----------+----------+--------------+ PERO     Full                                                        +---------+---------------+---------+-----------+----------+--------------+   +---------+---------------+---------+-----------+----------+--------------+ LEFT     CompressibilityPhasicitySpontaneityPropertiesThrombus Aging +---------+---------------+---------+-----------+----------+--------------+ CFV      Full           Yes      Yes                                 +---------+---------------+---------+-----------+----------+--------------+ SFJ      Full                                                        +---------+---------------+---------+-----------+----------+--------------+  FV Prox  Full                                                        +---------+---------------+---------+-----------+----------+--------------+ FV Mid   Full                                                        +---------+---------------+---------+-----------+----------+--------------+ FV DistalFull                                                        +---------+---------------+---------+-----------+----------+--------------+ PFV      Full                                                        +---------+---------------+---------+-----------+----------+--------------+ POP      Full           Yes      Yes                                  +---------+---------------+---------+-----------+----------+--------------+ PTV      Full                                                        +---------+---------------+---------+-----------+----------+--------------+ PERO     Full                                                        +---------+---------------+---------+-----------+----------+--------------+     Summary: BILATERAL: - No evidence of deep vein thrombosis seen in the lower extremities, bilaterally. - RIGHT: - There is no evidence of deep vein thrombosis in the lower extremity.  interstitial fluid noted  LEFT: - There is no evidence of deep vein thrombosis in the lower extremity.  Interstitial fluid noted.  *See table(s) above for measurements and observations. Electronically signed by Curt Jews MD on 09/14/2019 at 6:59:33 PM.    Final     Anti-infectives: Anti-infectives (From admission, onward)   Start     Dose/Rate Route Frequency Ordered Stop   09/05/19 2200  ceFEPIme (MAXIPIME) 2 g in sodium chloride 0.9 % 100 mL IVPB     2 g 200 mL/hr over 30 Minutes Intravenous Every 12 hours 09/05/19 0847 09/22/19 2359   09/03/19 0800  ceFEPIme (MAXIPIME) 2 g in sodium chloride 0.9 % 100 mL IVPB  Status:  Discontinued     2 g 200 mL/hr over 30 Minutes Intravenous Every 24 hours 09/02/19 0725  09/05/19 0847   09/02/19 1500  metroNIDAZOLE (FLAGYL) IVPB 500 mg     500 mg 100 mL/hr over 60 Minutes Intravenous Every 8 hours 09/02/19 1121 09/22/19 2359   09/02/19 0730  ceFEPIme (MAXIPIME) 2 g in sodium chloride 0.9 % 100 mL IVPB     2 g 200 mL/hr over 30 Minutes Intravenous  Once 09/02/19 0721 09/02/19 0900   09/02/19 0730  metroNIDAZOLE (FLAGYL) IVPB 500 mg     500 mg 100 mL/hr over 60 Minutes Intravenous  Once 09/02/19 0721 09/02/19 1056       Assessment/Plan CHF - EF 30-35% CAD, H/o CABG AS s/p AVR  HTN CKD-IV Asthma Atrial fibrillation on eliquis (last dose 6/4 in AM) Orthostatic hypotension Code status  DNR  Acute sigmoid diverticulitis with microperforation - 4th bout - CT 6/4 showed continued wall thickening of the sigmoid colon compatible with acute diverticulitisand asmall pocket of extraluminal gas within the left lower quadrant measuring up to 2.2 cm in size consistent with localized perforation; resolution of the inflammatory phlegmon seen within the lower pelvis on previous study, with no fluid collection or abscess seen today; mild ileus  ID -maxipime/flagyl 5/26>> FEN -IVF, soft diet, Boost VTE -IV heparin Foley -none Follow up -TBD  Plan:Pain/bloated improved, having bowel function, WBC down to 10.9. [place] for soft diet. Continue IV antibiotics. Plan to repeat CT scan tomorrow. Continue to hold eliquis.   LOS: 13 days    [facility] 09/15/2019, 9:04 AM Please see Amion for pager number during day hours 7:00am-4:30pm

## 2019-09-15 NOTE — Progress Notes (Signed)
ANTICOAGULATION CONSULT NOTE - Follow Up Consult  Pharmacy Consult for Heparin Indication: atrial fibrillation  Allergies  Allergen Reactions  . Novocain [Procaine] Swelling    SWELLING REACTION UNSPECIFIED   . Amiodarone Nausea And Vomiting    Patient Measurements: Height: 6' (182.9 cm) Weight: 94.2 kg (207 lb 10.8 oz) IBW/kg (Calculated) : 77.6 Heparin Dosing Weight: 92 kg  Vital Signs: Temp: 98.1 F (36.7 C) (06/08 1608) Temp Source: Oral (06/08 1608) BP: 142/68 (06/08 1608) Pulse Rate: 59 (06/08 1608)  Labs: Recent Labs    09/13/19 0515 09/13/19 0515 09/14/19 0537 09/14/19 0537 09/14/19 1530 09/15/19 0647 09/15/19 1608  HGB 11.7*   < > 11.1*  --   --  11.0*  --   HCT 36.7*  --  34.3*  --   --  34.2*  --   PLT 439*  --  398  --   --  434*  --   APTT 84*   < > 49*   < > 79* 56* 101*  HEPARINUNFRC 0.74*  --  0.47  --   --  0.42  --   CREATININE  --   --  1.58*  --   --  1.66*  --    < > = values in this interval not displayed.    Estimated Creatinine Clearance: 39.5 mL/min (A) (by C-G formula based on SCr of 1.66 mg/dL (H)).   Assessment: 86 yr old man on apixaban PTA for hx Afib (CHADS2VASc = 6) was admitted with recurrent diverticulitis. Repeat CT scan showed localized perforation. Pharmacy was asked to transition apixaban to IV heparin, in case procedures needed. Last dose of apixaban was 6/4 at 0931. Will monitor anticoagulation via aPTTs until effect of apixaban on heparin level clears. Renal function has been ~stable.  aPTT ~8 hrs after heparin infusion was increased to 1000 units/hr was 101 sec, which is at the upper end of the goal range for this pt. CBC stable. Per RN, no issues with IV or bleeding observed.    Goal of Therapy:  Heparin level 0.3-0.7 units/ml aPTT 66-102 seconds Monitor platelets by anticoagulation protocol: Yes   Plan:  Decrease heparin infusion to 950 units/hour Check 8-hr aPTT, heparin level Monitor daily aPTT, heparin level,  CBC Monitor for signs/symptoms of bleeding Follow up surgery plans and when to transition back to apixaban  Gillermina Hu, PharmD, BCPS, [facility] Clinical Pharmacist 09/15/19, 17:10 PM

## 2019-09-15 NOTE — Progress Notes (Signed)
Notified by CCMD that pt had 10 beat run VTach. Pt stable and asymptomatic. APP paged. Will continue monitoring.

## 2019-09-15 NOTE — TOC Initial Note (Addendum)
Transition of Care [facility]) - Initial/Assessment Note    Patient Details  Name: [name] MRN: [id number] Date of Birth: 06-Sep-1932  Transition of Care [facility]) CM/SW Contact:    Zenon Mayo, RN Phone Number: 09/15/2019, 6:59 PM  Clinical Narrative:                 NCM spoke with paitent, transfer from 60M ,  he lives with wife, he states she can assist him some what.  Per pt eval rec HHPT, he states he has had [place] with [facility] in the past and would like to work with them again.  He has PCP.  His son or daughter takes him to [facility]. TOC will continue to follow for dc needs.  Expected Discharge Plan: [place] Barriers to Discharge: Continued Medical Work up   Patient Goals and CMS Choice Patient states their goals for this hospitalization and ongoing recovery are:: get better      Expected Discharge Plan and Services Expected Discharge Plan: [place]   Discharge Planning Services: CM Consult Post Acute Care Choice: [place] arrangements for the past 2 months: Single Family Home                                      Prior Living Arrangements/Services Living arrangements for the past 2 months: Single Family Home Lives with:: Spouse Patient language and need for interpreter reviewed:: Yes Do you feel safe going back to the place where you live?: Yes      Need for Family Participation in Patient Care: Yes (Comment) Care giver support system in place?: Yes (comment) Current home services: DME(walker that is broken) Criminal Activity/Legal Involvement Pertinent to Current Situation/Hospitalization: No - Comment as needed  Activities of Daily Living Home Assistive Devices/Equipment: None ADL Screening (condition at time of admission) Patient's cognitive ability adequate to safely complete daily activities?: Yes Is the patient deaf or have difficulty hearing?: No Does the patient have difficulty seeing, even when  wearing glasses/contacts?: No Does the patient have difficulty concentrating, remembering, or making decisions?: No Patient able to express need for assistance with ADLs?: Yes Does the patient have difficulty dressing or bathing?: No Independently performs ADLs?: Yes (appropriate for developmental age) Does the patient have difficulty walking or climbing stairs?: Yes Weakness of Legs: Both Weakness of Arms/Hands: None  Permission Sought/Granted                  Emotional Assessment   Attitude/Demeanor/Rapport: Engaged Affect (typically observed): Appropriate Orientation: : Oriented to Place, Oriented to Self, Oriented to  Time, Oriented to Situation Alcohol / Substance Use: Not Applicable Psych Involvement: No (comment)  Admission diagnosis:  Diverticulitis [K57.92] Renal insufficiency [N28.9] Acute diverticulitis [K57.92] Patient Active Problem List   Diagnosis Date Noted  . Orthostatic hypotension 09/11/2019  . Ileus ([place]) 09/04/2019  . Hyponatremia 09/03/2019  . Metabolic acidosis 34/28/7681  . Diverticulitis 09/02/2019  . Acute urinary retention 09/02/2019  . Lower extremity weakness 11/29/2018  . Multiple thyroid nodules 08/13/2017  . DCM (dilated cardiomyopathy) ([place]) 06/16/2017  . History of aortic valve replacement with bioprosthetic valve   . Cardiac resynchronization therapy defibrillator (CRT-D) in place   . Protein-calorie malnutrition, severe 05/27/2017  . Hyperthyroidism 05/26/2017  . Cough 05/26/2017  . Pleural effusion on right 05/26/2017  . Malnutrition of moderate degree 05/09/2017  . GI  bleed 05/08/2017  . VT (ventricular tachycardia) ([place]) 05/06/2017  . Dizziness 04/23/2017  . Weight loss 04/23/2017  . Leg weakness 01/28/2017  . Chronic combined systolic (congestive) and diastolic (congestive) heart failure ([place])   . Shingles 08/23/2014  . Stage 4 chronic kidney disease ([place]) 08/23/2014  . Hyperkalemia 08/23/2014  . Essential hypertension  01/23/2013  . Dyslipidemia 01/23/2013  . Coronary artery disease 01/23/2013  . Persistent atrial fibrillation ([place]) 01/23/2013  . Chronic anticoagulation 01/23/2013   PCP:  [name], MD Pharmacy:   [id number] [place], [place] - [address] [place] [address]Phone: [phone]: [phone] [id number] [address]: [phone]: [phone] [facility] - [place]: [phone]: [phone]   Social Determinants of Health (SDOH) Interventions    Readmission Risk Interventions Readmission Risk Prevention Plan 09/15/2019 12/01/2018  Transportation Screening Complete Complete  PCP or Specialist Appt within 5-7 Days - Complete  Home Care Screening - Complete  Medication Review (RN CM) - Complete  HRI or Home Care Consult Complete -  Social Work Consult for Recovery Care Planning/Counseling Complete -  Palliative Care Screening Not Applicable -  Medication Review (RPress photographerComplete -  Some recent data might be hidden

## 2019-09-15 NOTE — Progress Notes (Signed)
Occupational Therapy Treatment Patient Details Name: [name] MRN: [id number] DOB: Dec 23, 1932 Today's Date: 09/15/2019    History of present illness 86 yo male presented to ED 09/02/19 with onset of generalized weakness after a flare of his diverticulitis with metabolic acidosis, acute urinary retention.  No perf on CT, severe diverticulitis on non-contrast CT 09/02/19. Pt currently being monitored for recurring Vtach that started 5/29. PMHx: TAVR, a-fib, CKD4, CHF, AICD, HTN, CAD with CABG, diverticulitis.   OT comments  Patient continues to make steady progress towards goals in skilled OT session. Patient's session encompassed minimal ADLs in standing and transfer to chair with min ambulation due to orthostatics. Pt initially dizzy upon sitting and standing, but subsided quickly and requesting to get in chair to sit up for a while. Pt continues to remain an excellent candidate for rehab as pt is highly motivated and has great family support. Discharge remains appropriate; will continue to follow acutely.  Orthostatics: BP Sitting: 125/67 BP Standing 0 minutes: 100/75 BP Standing 3 minutes (after marching in place): 88/68   Follow Up Recommendations  CIR    Equipment Recommendations  Other (comment)(TBD)    Recommendations for Other Services      Precautions / Restrictions Precautions Precautions: Fall       Mobility Bed Mobility Overal bed mobility: Modified Independent Bed Mobility: Supine to Sit     Supine to sit: Modified independent (Device/Increase time)     General bed mobility comments: Increased time to transition, use of bed rails and controls minimally  Transfers Overall transfer level: Needs assistance Equipment used: Rolling walker (2 wheeled) Transfers: Sit to/from Stand Sit to Stand: Min assist;From elevated surface         General transfer comment: Pt remains othrostatic, however with decreased dizziness after marching in place and motivated to  transfer to chair, pt able to complete with min A for steadying    Balance Overall balance assessment: Needs assistance Sitting-balance support: Feet supported;No upper extremity supported Sitting balance-Leahy Scale: Fair Sitting balance - Comments: Minimal dizziness EOB, eased off quickly   Standing balance support: Bilateral upper extremity supported;During functional activity Standing balance-Leahy Scale: Poor Standing balance comment: Pt relies on UE support but light support needed with RW.                            ADL either performed or assessed with clinical judgement   ADL Overall ADL's : (P) Needs assistance/impaired     Grooming: Wash/dry hands;Wash/dry face;Standing Grooming Details (indicate cue type and reason): Quickly attempted due to dizziness             Lower Body Dressing: Bed level;Set up;Sitting/lateral leans Lower Body Dressing Details (indicate cue type and reason): Able to perform figure 4 with increased time Toilet Transfer: Minimal assistance;Cueing for safety;RW Toilet Transfer Details (indicate cue type and reason): Simulated with transfer to recliner         Functional mobility during ADLs: Rolling walker;Cueing for safety;Cueing for sequencing;Minimal assistance General ADL Comments: Pt cotninues to make progress, but remians limited by orthostatics, very motivated to sit in the chair in session     Vision       Perception     Praxis      Cognition Arousal/Alertness: Awake/alert Behavior During Therapy: [facility] for tasks assessed/performed Overall Cognitive Status: Within Functional Limits for tasks assessed  Exercises     Shoulder Instructions       General Comments      Pertinent Vitals/ Pain       Pain Assessment: Faces Faces Pain Scale: Hurts a little bit Pain Location: generalized with movment Pain Descriptors / Indicators: Discomfort;Guarding Pain  Intervention(s): Limited activity within patient's tolerance;Monitored during session;Repositioned  Home Living                                          Prior Functioning/Environment              Frequency  Min 2X/week        Progress Toward Goals  OT Goals(current goals can now be found in the care plan section)  Progress towards OT goals: Progressing toward goals  Acute Rehab OT Goals Patient Stated Goal: to get back to my garden OT Goal Formulation: With patient Time For Goal Achievement: 09/22/19 Potential to Achieve Goals: Good  Plan Discharge plan remains appropriate    Co-evaluation                 AM-PAC OT "6 Clicks" Daily Activity     Outcome Measure   Help from another person eating meals?: None Help from another person taking care of personal grooming?: A Little Help from another person toileting, which includes using toliet, bedpan, or urinal?: A Lot Help from another person bathing (including washing, rinsing, drying)?: A Lot Help from another person to put on and taking off regular upper body clothing?: A Little Help from another person to put on and taking off regular lower body clothing?: A Lot 6 Click Score: 16    End of Session Equipment Utilized During Treatment: Gait belt;Rolling walker  OT Visit Diagnosis: Unsteadiness on feet (R26.81);Muscle weakness (generalized) (M62.81)   Activity Tolerance Patient tolerated treatment well   Patient Left in bed;with call bell/phone within reach;with chair alarm set   Nurse Communication Mobility status;Other (comment)(Orthostatic, and voided measurement for RN to chart)        Time: 0945-1010 OT Time Calculation (min): 25 min  Charges: OT General Charges $OT Visit: 1 Visit OT Treatments $Self Care/Home Management : 23-37 mins  [place]. [place], [place] [facility] [place] 09/15/2019, 1:14 PM

## 2019-09-15 NOTE — Progress Notes (Signed)
Pharmacy Antibiotic Note  [name] is a 86 y.o. male admitted on 09/01/2019 with intra-abdominal infection.  Pharmacy consulted 09/02/19 for cefepime dosing. Metronidazole added 5/26.   Patient is afebrile and appears to be improving slowly. Continues to be afebrile, WBC 10.9. 6/4 CT abdomen with likley ileus, diverticulitis with localized sigmoid perforation. Renal function stable.  Discussed with MD, will extend antibiotic end date to 21 days for now pending repeat CT scan 6/9 and surgery recs.    Plan: Continue cefepime 2 gm IV Q 12 hours through 09/22/19. Continue metronidazole 500 mg IV Q8h through 09/22/2019 Monitor CBC, renal fx, cultures and clinical progress  F/u repeat CT scan 6/15   Height: 6' (182.9 cm) Weight: 94.2 kg (207 lb 10.8 oz) IBW/kg (Calculated) : 77.6  Temp (24hrs), Avg:97.9 F (36.6 C), Min:97.6 F (36.4 C), Max:98.2 F (36.8 C)  Recent Labs  Lab 09/09/19 0718 09/09/19 0718 09/10/19 0344 09/11/19 0359 09/13/19 0515 09/14/19 0537 09/15/19 0647  WBC 12.5*   < > 13.4* 12.6* 14.5* 11.1* 10.9*  CREATININE 1.95*  --   --  1.93*  --  1.58* 1.66*   < > = values in this interval not displayed.    Estimated Creatinine Clearance: 39.5 mL/min (A) (by C-G formula based on SCr of 1.66 mg/dL (H)).    Allergies  Allergen Reactions  . Novocain [Procaine] Swelling    SWELLING REACTION UNSPECIFIED   . Amiodarone Nausea And Vomiting    Antimicrobials this admission: Cefepime 5/26 >> 6/8 Metronidazole 5/26>>6/8  Dose adjustments this admission: Cefepime 2gm q24 to 2gm IV q12h  Microbiology results: 5/26 BCx: (anaerobic): (diphtheroids/corynebacterium > likely contaminant) 5/26 UCx:  Multiple species   Benetta Spar, PharmD, BCPS, [facility] Clinical Pharmacist  Please check AMION for all [place] phone numbers After 10:00 PM, call [place] [phone]

## 2019-09-15 NOTE — Progress Notes (Signed)
[facility] PROGRESS NOTE    Progress Note  [name]  KVQ:[id number] DOB: [birth year] DOA: 09/01/2019 PCP: [name], MD     Brief Narrative:   [name] is an 86 y.o. male admitted to the hospital with acute diverticulitis and nonsustained V. tach and ileus.  NG tube was placed and he has been treated with conservative management.  His NG tube was clamped on 09/08/2019 he was only putting about 500 cc.  X-ray was obtained that showed dilated loops.  Assessment/Plan:   Acute severe diverticulitis/ileus: Continue IV cefepime and Flagyl and he has afebrile leukocytosis improving. Repeated CT scan showed possible microperforation with gas collection. Appreciate surgery's assistance they plan to repeat the CT scan of abdomen and pelvis on 09/16/2019 He is tolerating his diet and having regular bowel movements.  Chronic combined systolic and diastolic heart failure: Continue daily standing weights, strict I's and O's, restrict his fluids to 1500 cc resume his home dose of Lasix. Lower extremity Dopplers negative for DVT.  Chronic hyponatremia and metabolic acidosis: Resolved with IV Lasix and fluid restriction.  Chronic kidney disease stage IV: Creatinine remained at baseline. With a baseline creatinine of 1.9-2.3.  Chronic atrial fibrillation: Continue Coreg, Eliquis held and started on IV heparin, and the unfortunate circumstances surgical intervention is needed. Chads Vasc score greater than 2.  Urinary retention: Appears to be resolved.  New orthostatic hypotension: Now resolved continue current regimen he will go home off hydralazine.  Severe protein caloric malnutrition: His prealbumin is low, getting boost supplements.   Continue Ensure daily     DVT prophylaxis: IV heparin Family Communication:none Status is: Inpatient  Remains inpatient appropriate because:Hemodynamically unstable and IV treatments appropriate due to intensity of illness or  inability to take PO   Dispo: The patient is from: Home              Anticipated d/c is to: SNF              Anticipated d/c date is: 3 days              Patient currently is not medically stable to d/c.     Code Status:     Code Status Orders  (From admission, onward)         Start     Ordered   09/02/19 1119  Do not attempt resuscitation (DNR)  Continuous    Question Answer Comment  In the event of cardiac or respiratory ARREST Do not call a "code blue"   In the event of cardiac or respiratory ARREST Do not perform Intubation, CPR, defibrillation or ACLS   In the event of cardiac or respiratory ARREST Use medication by any route, position, wound care, and other measures to relive pain and suffering. May use oxygen, suction and manual treatment of airway obstruction as needed for comfort.      09/02/19 1121        Code Status History    Date Active Date Inactive Code Status Order ID Comments User Context   11/29/2018 1455 12/01/2018 2053 Full Code [id number]  Albertine Patricia, MD ED   06/15/2017 2214 06/20/2017 1940 Full Code [id number]  Marcie Mowers, MD ED   05/26/2017 1733 06/04/2017 2006 Full Code [id number]  Vashti Hey, MD ED   05/08/2017 1516 05/10/2017 1808 Full Code [id number]  Brenton Grills, PA-C ED   06/16/2015 1305 06/19/2015 1646 Full Code [id number]  Elgergawy, Silver Huguenin, MD ED  04/30/2015 2256 05/02/2015 1852 Full Code [id number]  Kinnie Feil, MD Inpatient   04/22/2015 1504 04/23/2015 1457 Full Code [id number]  Constance Haw, MD Inpatient   11/04/2014 0853 11/04/2014 1454 Full Code [id number]  Leonie Man, MD Inpatient   08/23/2014 0117 08/26/2014 1646 Full Code [id number]  Rise Patience, MD Inpatient   Advance Care Planning Activity        IV Access:    Peripheral IV   Procedures and diagnostic studies:   VAS [place] LOWER EXTREMITY VENOUS (DVT)  Result Date: 09/14/2019  Lower Venous DVT Study Indications: Edema.   Comparison Study: No prior study on file for comparison Performing Technologist: Sharion Dove RVS  Examination Guidelines: A complete evaluation includes B-mode imaging, spectral Doppler, color Doppler, and power Doppler as needed of all accessible portions of each vessel. Bilateral testing is considered an integral part of a complete examination. Limited examinations for reoccurring indications may be performed as noted. The reflux portion of the exam is performed with the patient in reverse Trendelenburg.  +---------+---------------+---------+-----------+----------+--------------+ RIGHT    CompressibilityPhasicitySpontaneityPropertiesThrombus Aging +---------+---------------+---------+-----------+----------+--------------+ CFV      Full           Yes      Yes                                 +---------+---------------+---------+-----------+----------+--------------+ SFJ      Full                                                        +---------+---------------+---------+-----------+----------+--------------+ FV Prox  Full                                                        +---------+---------------+---------+-----------+----------+--------------+ FV Mid   Full                                                        +---------+---------------+---------+-----------+----------+--------------+ FV DistalFull                                                        +---------+---------------+---------+-----------+----------+--------------+ PFV      Full                                                        +---------+---------------+---------+-----------+----------+--------------+ POP      Full           Yes      Yes                                 +---------+---------------+---------+-----------+----------+--------------+  PTV      Full                                                         +---------+---------------+---------+-----------+----------+--------------+ PERO     Full                                                        +---------+---------------+---------+-----------+----------+--------------+   +---------+---------------+---------+-----------+----------+--------------+ LEFT     CompressibilityPhasicitySpontaneityPropertiesThrombus Aging +---------+---------------+---------+-----------+----------+--------------+ CFV      Full           Yes      Yes                                 +---------+---------------+---------+-----------+----------+--------------+ SFJ      Full                                                        +---------+---------------+---------+-----------+----------+--------------+ FV Prox  Full                                                        +---------+---------------+---------+-----------+----------+--------------+ FV Mid   Full                                                        +---------+---------------+---------+-----------+----------+--------------+ FV DistalFull                                                        +---------+---------------+---------+-----------+----------+--------------+ PFV      Full                                                        +---------+---------------+---------+-----------+----------+--------------+ POP      Full           Yes      Yes                                 +---------+---------------+---------+-----------+----------+--------------+ PTV      Full                                                        +---------+---------------+---------+-----------+----------+--------------+  PERO     Full                                                        +---------+---------------+---------+-----------+----------+--------------+     Summary: BILATERAL: - No evidence of deep vein thrombosis seen in the lower extremities, bilaterally. - RIGHT: - There  is no evidence of deep vein thrombosis in the lower extremity.  interstitial fluid noted  LEFT: - There is no evidence of deep vein thrombosis in the lower extremity.  Interstitial fluid noted.  *See table(s) above for measurements and observations. Electronically signed by Curt Jews MD on 09/14/2019 at 6:59:33 PM.    Final      Medical Consultants:    None.  Anti-Infectives:   Cefepime and Flagyl.  Subjective:    [name] relates he had off all night with abdominal pain and felt like he was going down till he had a bowel movement.  Objective:    Vitals:   09/15/19 0300 09/15/19 0347 09/15/19 0750 09/15/19 0816  BP:  (!) 144/58 140/79 (!) 130/54  Pulse:  75 79 76  Resp:  _0 Temp:  97.9 F (36.6 C) 97.9 F (36.6 C) 97.6 F (36.4 C)  TempSrc:  Oral Oral Oral  SpO2:  97% 97% 99%  Weight: 94.2 kg     Height:       SpO2: 99 % O2 Flow Rate (L/min): 0 L/min   Intake/Output Summary (Last 24 hours) at 09/15/2019 0900 Last data filed at 09/15/2019 0823 Gross per 24 hour  Intake 1193.74 ml  Output 2550 ml  Net -1356.26 ml   Filed Weights   09/13/19 0416 09/14/19 0521 09/15/19 0300  Weight: 96.3 kg 94 kg 94.2 kg    Exam: General exam: In no acute distress. Respiratory system: Good air movement and clear to auscultation. Cardiovascular system: S1 & S2 heard, RRR. No JVD. Gastrointestinal system: Abdomen is soft less tender than yesterday nondistended no rebound or guarding. Extremities: No pedal edema. Skin: No rashes, lesions or ulcers Psychiatry: Judgement and insight appear normal.   Data Reviewed:    Labs: Basic Metabolic Panel: Recent Labs  Lab 09/09/19 0718 09/09/19 0718 09/11/19 0359 09/11/19 0359 09/14/19 0537 09/15/19 0647  NA 138  --  137  --  134* 135  K 3.9   < > 4.0   < > 4.3 3.8  CL 103  --  105  --  106 107  CO2 21*  --  21*  --  19* 19*  GLUCOSE 114*  --  91  --  124* 127*  BUN 37*  --  38*  --  21 19  CREATININE 1.95*  --   1.93*  --  1.58* 1.66*  CALCIUM 8.0*  --  7.8*  --  7.7* 7.8*  MG 2.0  --   --   --  2.0  --   PHOS  --   --   --   --  2.4*  --    < > = values in this interval not displayed.   GFR Estimated Creatinine Clearance: 39.5 mL/min (A) (by C-G formula based on SCr of 1.66 mg/dL (H)). Liver Function Tests: No results for input(s): AST, ALT, ALKPHOS, BILITOT, PROT, ALBUMIN in the last 168 hours. No results for input(s):  LIPASE, AMYLASE in the last 168 hours. No results for input(s): AMMONIA in the last 168 hours. Coagulation profile No results for input(s): INR, PROTIME in the last 168 hours. COVID-19 Labs  No results for input(s): DDIMER, FERRITIN, LDH, CRP in the last 72 hours.  Lab Results  Component Value Date   [place] NEGATIVE 09/02/2019   SARSCOV2NAA NOT DETECTED 11/29/2018    CBC: Recent Labs  Lab 09/09/19 0718 09/09/19 0718 09/10/19 0344 09/11/19 0359 09/13/19 0515 09/14/19 0537 09/15/19 0647  WBC 12.5*   < > 13.4* 12.6* 14.5* 11.1* 10.9*  NEUTROABS 7.9*  --  8.5* 8.1*  --   --   --   HGB 11.9*   < > 11.6* 11.4* 11.7* 11.1* 11.0*  HCT 37.1*   < > 36.0* 35.9* 36.7* 34.3* 34.2*  MCV 92.8   < > 93.8 92.8 92.4 91.7 92.2  PLT 366   < > 382 378 439* 398 434*   < > = values in this interval not displayed.   Cardiac Enzymes: No results for input(s): CKTOTAL, CKMB, CKMBINDEX, TROPONINI in the last 168 hours. BNP (last 3 results) No results for input(s): PROBNP in the last 8760 hours. CBG: Recent Labs  Lab 09/12/19 1230  GLUCAP 177*   D-Dimer: No results for input(s): DDIMER in the last 72 hours. Hgb A1c: No results for input(s): HGBA1C in the last 72 hours. Lipid Profile: No results for input(s): CHOL, HDL, LDLCALC, TRIG, CHOLHDL, LDLDIRECT in the last 72 hours. Thyroid function studies: No results for input(s): TSH, T4TOTAL, T3FREE, THYROIDAB in the last 72 hours.  Invalid input(s): FREET3 Anemia work up: No results for input(s): VITAMINB12, FOLATE,  FERRITIN, TIBC, IRON, RETICCTPCT in the last 72 hours. Sepsis Labs: Recent Labs  Lab 09/11/19 0359 09/13/19 0515 09/14/19 0537 09/15/19 0647  WBC 12.6* 14.5* 11.1* 10.9*   Microbiology No results found for this or any previous visit (from the past 240 hour(s)).   Medications:   . atorvastatin  20 mg Oral QPM  . carvedilol  25 mg Oral BID WC  . docusate sodium  100 mg Oral BID  . feeding supplement  1 Container Oral TID BM  . furosemide  40 mg Oral Daily  . isosorbide mononitrate  30 mg Oral QPM  . mexiletine  300 mg Oral BID  . pantoprazole  40 mg Oral Daily  . sodium chloride flush  3 mL Intravenous Q12H  . sotalol  80 mg Oral BID   Continuous Infusions: . ceFEPime (MAXIPIME) IV 2 g (09/14/19 2342)  . heparin 1,000 Units/hr (09/15/19 0818)  . metronidazole 500 mg (09/15/19 0629)      LOS: 13 days   Charlynne Cousins  [facility]  09/15/2019, 9:00 AM

## 2019-09-15 NOTE — Progress Notes (Signed)
ANTICOAGULATION CONSULT NOTE - Initial Consult  Pharmacy Consult for heparin Indication: atrial fibrillation  Allergies  Allergen Reactions   Novocain [Procaine] Swelling    SWELLING REACTION UNSPECIFIED    Amiodarone Nausea And Vomiting    Patient Measurements: Height: 6' (182.9 cm) Weight: 94.2 kg (207 lb 10.8 oz) IBW/kg (Calculated) : 77.6 Heparin Dosing Weight: 92kg  Vital Signs: Temp: 97.6 F (36.4 C) (06/08 0816) Temp Source: Oral (06/08 0816) BP: 130/54 (06/08 0816) Pulse Rate: 76 (06/08 0816)  Labs: Recent Labs    09/13/19 0515 09/13/19 0515 09/14/19 0537 09/14/19 1530 09/15/19 0647  HGB 11.7*   < > 11.1*  --  11.0*  HCT 36.7*  --  34.3*  --  34.2*  PLT 439*  --  398  --  434*  APTT 84*   < > 49* 79* 56*  HEPARINUNFRC 0.74*  --  0.47  --  0.42  CREATININE  --   --  1.58*  --  1.66*   < > = values in this interval not displayed.    Estimated Creatinine Clearance: 39.5 mL/min (A) (by C-G formula based on SCr of 1.66 mg/dL (H)).   Assessment: 7 yoM on apixaban PTA for hx Afib (CHADS2VASc = 6) admitted with recurrent diverticulitis. Repeat CT scan showed localized perforation. Pharmacy asked to transition apixaban to IV heparin in case procedures needed. Last dose of apixaban was 6/4 at 0931. Will dose via aPTTs until DOAC clears. Renal function has been stable.  aPTT this morning is subtherapeutic at 56 seconds on 900 units/hr. Unclear reason since it was therapeutic yesterday evening. HL still not correlating with aPTT despite holding apixaban for 4 days indicating poor drug clearance. H&H and platelets stable. No signs of bleeding noted.   Goal of Therapy:  Heparin level 0.3-0.7 units/ml aPTT 66-102 seconds Monitor platelets by anticoagulation protocol: Yes   Plan:  -Increase heparin infusion to 1000 units/hour -F/u 8 hr aPTT  -F/u aPTT until correlates with heparin level  -Daily aPTT, heparin level, and CBC -Monitor for signs of bleeding -Follow  up surgery plans and when to transition back to [place], PharmD, BCPS, [place] Pharmacist  Please check AMION for all [place] phone numbers After 10:00 PM, call [place]

## 2019-09-16 ENCOUNTER — Inpatient Hospital Stay (HOSPITAL_COMMUNITY): Payer: Medicare HMO

## 2019-09-16 DIAGNOSIS — I10 Essential (primary) hypertension: Secondary | ICD-10-CM

## 2019-09-16 DIAGNOSIS — E872 Acidosis: Secondary | ICD-10-CM

## 2019-09-16 DIAGNOSIS — N184 Chronic kidney disease, stage 4 (severe): Secondary | ICD-10-CM

## 2019-09-16 DIAGNOSIS — E871 Hypo-osmolality and hyponatremia: Secondary | ICD-10-CM

## 2019-09-16 DIAGNOSIS — I5042 Chronic combined systolic (congestive) and diastolic (congestive) heart failure: Secondary | ICD-10-CM

## 2019-09-16 DIAGNOSIS — K567 Ileus, unspecified: Secondary | ICD-10-CM

## 2019-09-16 DIAGNOSIS — I4819 Other persistent atrial fibrillation: Secondary | ICD-10-CM

## 2019-09-16 LAB — BASIC METABOLIC PANEL
Anion gap: 10 (ref 5–15)
BUN: 20 mg/dL (ref 8–23)
CO2: 19 mmol/L — ABNORMAL LOW (ref 22–32)
Calcium: 7.5 mg/dL — ABNORMAL LOW (ref 8.9–10.3)
Chloride: 105 mmol/L (ref 98–111)
Creatinine, Ser: 1.76 mg/dL — ABNORMAL HIGH (ref 0.61–1.24)
GFR calc Af Amer: 40 mL/min — ABNORMAL LOW (ref >60)
GFR calc non Af Amer: 35 mL/min — ABNORMAL LOW (ref >60)
Glucose, Bld: 194 mg/dL — ABNORMAL HIGH (ref 70–99)
Potassium: 3.3 mmol/L — ABNORMAL LOW (ref 3.5–5.1)
Sodium: 134 mmol/L — ABNORMAL LOW (ref 135–145)

## 2019-09-16 LAB — CBC
HCT: 31.5 % — ABNORMAL LOW (ref 39.0–52.0)
Hemoglobin: 10.2 g/dL — ABNORMAL LOW (ref 13.0–17.0)
MCH: 29.7 pg (ref 26.0–34.0)
MCHC: 32.4 g/dL (ref 30.0–36.0)
MCV: 91.8 fL (ref 80.0–100.0)
Platelets: 410 10*3/uL — ABNORMAL HIGH (ref 150–400)
RBC: 3.43 MIL/uL — ABNORMAL LOW (ref 4.22–5.81)
RDW: 15.6 % — ABNORMAL HIGH (ref 11.5–15.5)
WBC: 10.4 10*3/uL (ref 4.0–10.5)
nRBC: 0 % (ref 0.0–0.2)

## 2019-09-16 LAB — APTT: aPTT: 104 seconds — ABNORMAL HIGH (ref 24–36)

## 2019-09-16 LAB — HEPARIN LEVEL (UNFRACTIONATED): Heparin Unfractionated: 0.43 IU/mL (ref 0.30–0.70)

## 2019-09-16 MED ORDER — POTASSIUM CHLORIDE CRYS ER 20 MEQ PO TBCR
40.0000 meq | EXTENDED_RELEASE_TABLET | ORAL | Status: AC
  Administered 2019-09-16 (×2): 40 meq via ORAL
  Filled 2019-09-16 (×2): qty 2

## 2019-09-16 MED ORDER — APIXABAN 2.5 MG PO TABS
2.5000 mg | ORAL_TABLET | Freq: Two times a day (BID) | ORAL | Status: DC
  Administered 2019-09-16 – 2019-09-23 (×14): 2.5 mg via ORAL
  Filled 2019-09-16 (×15): qty 1

## 2019-09-16 MED ORDER — SACCHAROMYCES BOULARDII 250 MG PO CAPS
250.0000 mg | ORAL_CAPSULE | Freq: Two times a day (BID) | ORAL | Status: DC
  Administered 2019-09-16 – 2019-09-23 (×13): 250 mg via ORAL
  Filled 2019-09-16 (×13): qty 1

## 2019-09-16 NOTE — Progress Notes (Signed)
[facility] Progress Note     Subjective: CC-  Feeling a little better each day. Tolerating soft food although still not eating much. Passing a lot of gas this AM, last BM 2 days ago. Less bloating after PO intake. denies n/v. He reports only one instance of mild RLQ pain this morning. Still feels somewhat lightheaded when he gets up.  WBC 10.4  Objective: Vital signs in last 24 hours: Temp:  [97.6 F (36.4 C)-98.2 F (36.8 C)] 98 F (36.7 C) (06/09 0501) Pulse Rate:  [59-98] 67 (06/09 0735) Resp:  [17-20] 18 (06/09 0501) BP: (104-153)/(50-79) 153/75 (06/09 0735) SpO2:  [96 %-100 %] 100 % (06/09 0735) Weight:  [94.3 kg] 94.3 kg (06/09 0500) Last BM Date: 09/14/19  Intake/Output from previous day: 06/08 0701 - 06/09 0700 In: 2377.3 [P.O.:1320; I.V.:346.6; IV Piggyback:710.7] Out: 2676 [Urine:2676] Intake/Output this shift: No intake/output data recorded.  PE: Gen: Alert, NAD, pleasant Card: RRR Pulm: rate and effort normal Abd: Soft,mild distension, +BS, no HSM,nontender Skin: no rashes noted, warm and dry   Lab Results:  Recent Labs    09/15/19 0647 09/16/19 0049  WBC 10.9* 10.4  HGB 11.0* 10.2*  HCT 34.2* 31.5*  PLT 434* 410*   BMET Recent Labs    09/15/19 0647 09/16/19 0049  NA 135 134*  K 3.8 3.3*  CL 107 105  CO2 19* 19*  GLUCOSE 127* 194*  BUN 19 20  CREATININE 1.66* 1.76*  CALCIUM 7.8* 7.5*   PT/INR No results for input(s): LABPROT, INR in the last 72 hours. CMP     Component Value Date/Time   NA 134 (L) 09/16/2019 0049   NA 138 05/23/2018 1240   K 3.3 (L) 09/16/2019 0049   CL 105 09/16/2019 0049   CO2 19 (L) 09/16/2019 0049   GLUCOSE 194 (H) 09/16/2019 0049   BUN 20 09/16/2019 0049   BUN 27 05/23/2018 1240   CREATININE 1.76 (H) 09/16/2019 0049   CREATININE 1.72 (H) 04/14/2015 0942   CALCIUM 7.5 (L) 09/16/2019 0049   PROT 4.9 (L) 09/08/2019 0351   PROT 6.3 04/23/2017 1048   ALBUMIN 1.9 (L) 09/08/2019 0351    ALBUMIN 3.9 04/23/2017 1048   AST 28 09/08/2019 0351   ALT 25 09/08/2019 0351   ALKPHOS 36 (L) 09/08/2019 0351   BILITOT 0.7 09/08/2019 0351   BILITOT 0.8 04/23/2017 1048   GFRNONAA 35 (L) 09/16/2019 0049   GFRAA 40 (L) 09/16/2019 0049   Lipase     Component Value Date/Time   LIPASE 22 09/01/2019 1835       Studies/Results: VAS [place] LOWER EXTREMITY VENOUS (DVT)  Result Date: 09/14/2019  Lower Venous DVT Study Indications: Edema.  Comparison Study: No prior study on file for comparison Performing Technologist: Sharion Dove RVS  Examination Guidelines: A complete evaluation includes B-mode imaging, spectral Doppler, color Doppler, and power Doppler as needed of all accessible portions of each vessel. Bilateral testing is considered an integral part of a complete examination. Limited examinations for reoccurring indications may be performed as noted. The reflux portion of the exam is performed with the patient in reverse Trendelenburg.  +---------+---------------+---------+-----------+----------+--------------+ RIGHT    CompressibilityPhasicitySpontaneityPropertiesThrombus Aging +---------+---------------+---------+-----------+----------+--------------+ CFV      Full           Yes      Yes                                 +---------+---------------+---------+-----------+----------+--------------+  SFJ      Full                                                        +---------+---------------+---------+-----------+----------+--------------+ FV Prox  Full                                                        +---------+---------------+---------+-----------+----------+--------------+ FV Mid   Full                                                        +---------+---------------+---------+-----------+----------+--------------+ FV DistalFull                                                         +---------+---------------+---------+-----------+----------+--------------+ PFV      Full                                                        +---------+---------------+---------+-----------+----------+--------------+ POP      Full           Yes      Yes                                 +---------+---------------+---------+-----------+----------+--------------+ PTV      Full                                                        +---------+---------------+---------+-----------+----------+--------------+ PERO     Full                                                        +---------+---------------+---------+-----------+----------+--------------+   +---------+---------------+---------+-----------+----------+--------------+ LEFT     CompressibilityPhasicitySpontaneityPropertiesThrombus Aging +---------+---------------+---------+-----------+----------+--------------+ CFV      Full           Yes      Yes                                 +---------+---------------+---------+-----------+----------+--------------+ SFJ      Full                                                        +---------+---------------+---------+-----------+----------+--------------+  FV Prox  Full                                                        +---------+---------------+---------+-----------+----------+--------------+ FV Mid   Full                                                        +---------+---------------+---------+-----------+----------+--------------+ FV DistalFull                                                        +---------+---------------+---------+-----------+----------+--------------+ PFV      Full                                                        +---------+---------------+---------+-----------+----------+--------------+ POP      Full           Yes      Yes                                  +---------+---------------+---------+-----------+----------+--------------+ PTV      Full                                                        +---------+---------------+---------+-----------+----------+--------------+ PERO     Full                                                        +---------+---------------+---------+-----------+----------+--------------+     Summary: BILATERAL: - No evidence of deep vein thrombosis seen in the lower extremities, bilaterally. - RIGHT: - There is no evidence of deep vein thrombosis in the lower extremity.  interstitial fluid noted  LEFT: - There is no evidence of deep vein thrombosis in the lower extremity.  Interstitial fluid noted.  *See table(s) above for measurements and observations. Electronically signed by Curt Jews MD on 09/14/2019 at 6:59:33 PM.    Final     Anti-infectives: Anti-infectives (From admission, onward)   Start     Dose/Rate Route Frequency Ordered Stop   09/05/19 2200  ceFEPIme (MAXIPIME) 2 g in sodium chloride 0.9 % 100 mL IVPB     2 g 200 mL/hr over 30 Minutes Intravenous Every 12 hours 09/05/19 0847 09/22/19 2359   09/03/19 0800  ceFEPIme (MAXIPIME) 2 g in sodium chloride 0.9 % 100 mL IVPB  Status:  Discontinued     2 g 200 mL/hr over 30 Minutes Intravenous Every 24 hours 09/02/19 0725  09/05/19 0847   09/02/19 1500  metroNIDAZOLE (FLAGYL) IVPB 500 mg     500 mg 100 mL/hr over 60 Minutes Intravenous Every 8 hours 09/02/19 1121 09/22/19 2359   09/02/19 0730  ceFEPIme (MAXIPIME) 2 g in sodium chloride 0.9 % 100 mL IVPB     2 g 200 mL/hr over 30 Minutes Intravenous  Once 09/02/19 0721 09/02/19 0900   09/02/19 0730  metroNIDAZOLE (FLAGYL) IVPB 500 mg     500 mg 100 mL/hr over 60 Minutes Intravenous  Once 09/02/19 0721 09/02/19 1056       Assessment/Plan CHF - EF 30-35% CAD, H/o CABG AS s/p AVR  HTN CKD-IV Asthma Atrial fibrillation on eliquis (last dose 6/4 in AM) Orthostatic hypotension Code status  DNR  Acute sigmoid diverticulitis with microperforation - 4th bout - CT 6/4 showed continued wall thickening of the sigmoid colon compatible with acute diverticulitisand asmall pocket of extraluminal gas within the left lower quadrant measuring up to 2.2 cm in size consistent with localized perforation; resolution of the inflammatory phlegmon seen within the lower pelvis on previous study, with no fluid collection or abscess seen today; mild ileus  ID -maxipime/flagyl 5/26>> FEN -IVF, soft diet, Boost VTE -IV heparin Foley -none Follow up -TBD  Plan:Repeat CT scan today. If stable/improved he could likely be discharged home on course of oral antibiotics from our standpoint and we will arrange follow up with one of our colorectal surgeons. Continue to hold eliquis until CT is back.   LOS: 14 days    [facility] 09/16/2019, 7:47 AM Please see Amion for pager number during day hours 7:00am-4:30pm

## 2019-09-16 NOTE — Progress Notes (Signed)
[facility] PROGRESS NOTE    Progress Note  [name]  NNN:[id number] DOB: 1932/04/14 DOA: 09/01/2019 PCP: [name], MD     Brief Narrative:   [name] is an 86 y.o. male admitted to the hospital with acute diverticulitis and nonsustained V. tach and ileus, has been managed conservatively with NG tube placement. X-ray was obtained that showed dilated loops.  Assessment/Plan:   Acute severe diverticulitis/ileus Currently afebrile, with no leukocytosis Tolerating diet, having BMs Repeat CT abdomen/pelvis on 09/16/19 shows resolving diverticulitis, no abscess Continue IV cefepime and Flagyl General surgery consulted, plan for outpt follow up, d/c on oral AB x 14 days  Hypokalemia Replace prn  Chronic combined systolic and diastolic heart failure Continue daily standing weights, strict I's and O's, restrict his fluids to 1500 cc resume his home dose of Lasix. Lower extremity Dopplers negative for DVT.  Chronic hyponatremia and metabolic acidosis Daily BMP  Chronic kidney disease stage IV Creatinine remained at baseline. With a baseline creatinine of 1.9-2.3 Daily BMP  Anemia of chronic kidney disease Hemoglobin at baseline Daily CBC  Chronic atrial fibrillation HR controlled Continue Coreg, plan to restart Eliquis and d/c heparin No surgical intervention planned this admission Chads Vasc score greater than 2.  Urinary retention Appears to be resolved  New orthostatic hypotension Resolved D/C home off hydralazine.  Severe protein caloric malnutrition His prealbumin is low, getting boost supplements.   Continue Ensure daily     DVT prophylaxis: IV heparin Family Communication:none Status is: Inpatient  Remains inpatient appropriate because:Hemodynamically unstable and IV treatments appropriate due to intensity of illness or inability to take PO   Dispo: The patient is from: Home              Anticipated d/c is to: SNF               Anticipated d/c date is: 1 day              Patient currently is medically stable to d/c.     Code Status:     Code Status Orders  (From admission, onward)         Start     Ordered   09/02/19 1119  Do not attempt resuscitation (DNR)  Continuous    Question Answer Comment  In the event of cardiac or respiratory ARREST Do not call a "code blue"   In the event of cardiac or respiratory ARREST Do not perform Intubation, CPR, defibrillation or ACLS   In the event of cardiac or respiratory ARREST Use medication by any route, position, wound care, and other measures to relive pain and suffering. May use oxygen, suction and manual treatment of airway obstruction as needed for comfort.      09/02/19 1121        Code Status History    Date Active Date Inactive Code Status Order ID Comments User Context   11/29/2018 1455 12/01/2018 2053 Full Code [id number]  Albertine Patricia, MD ED   06/15/2017 2214 06/20/2017 1940 Full Code [id number]  Marcie Mowers, MD ED   05/26/2017 1733 06/04/2017 2006 Full Code [id number]  Vashti Hey, MD ED   05/08/2017 1516 05/10/2017 1808 Full Code [id number]  Brenton Grills, PA-C ED   06/16/2015 1305 06/19/2015 1646 Full Code [id number]  Elgergawy, Silver Huguenin, MD ED   04/30/2015 2256 05/02/2015 1852 Full Code [id number]  Kinnie Feil, MD Inpatient   04/22/2015 1504 04/23/2015 1457 Full Code [id number]  Constance Haw, MD Inpatient   11/04/2014 0853 11/04/2014 1454 Full Code [id number]  Leonie Man, MD Inpatient   08/23/2014 0117 08/26/2014 1646 Full Code [id number]  Rise Patience, MD Inpatient   Advance Care Planning Activity        IV Access:    Peripheral IV   Procedures and diagnostic studies:   CT ABDOMEN PELVIS WO CONTRAST  Result Date: 09/16/2019 CLINICAL DATA:  Abdominal distension. Follow-up diverticulitis and localized perforation. EXAM: CT ABDOMEN AND PELVIS WITHOUT CONTRAST TECHNIQUE: Multidetector CT imaging of  the abdomen and pelvis was performed following the standard protocol without IV contrast. COMPARISON:  CT scans 6/1 and 09/11/2019 FINDINGS: Lower chest: Persistent bilateral pleural effusions with overlying atelectasis. The heart is normal in size. Stable small hiatal hernia. Stable emphysematous changes. Hepatobiliary: No focal hepatic lesions or intrahepatic biliary dilatation. The gallbladder is unremarkable. No common bile duct dilatation. Pancreas: No mass, inflammation or ductal dilatation. Spleen: Normal size.  No focal lesions. Adrenals/Urinary Tract: The adrenal glands and kidneys are unremarkable and stable. No renal, ureteral or bladder calculi or mass. Stomach/Bowel: The stomach, duodenum and small bowel are unremarkable. No acute inflammatory changes, mass lesions or obstructive findings. Severe changes of sigmoid colon diverticulosis. Resolving changes of diverticulitis. No residual significant inflammatory changes. No diverticular abscess. There is a persistent focus of loculated air noted in the pelvis adjacent to a small bowel loop likely related to a localized perforation which sealed off. No leaking oral contrast is identified. Vascular/Lymphatic: Stable advanced vascular calcifications. No aneurysm. No mesenteric or retroperitoneal mass or adenopathy. No pelvic adenopathy or inguinal adenopathy. There are small bilateral inguinal hernias containing fat. Reproductive: The prostate gland and seminal vesicles are unremarkable. Other: Small amount of presacral fluid/edema appears stable. No pelvic abscess. Musculoskeletal: No significant bony findings. IMPRESSION: 1. Resolving changes of diverticulitis.  No diverticular abscess. 2. Persistent focus of loculated air in the pelvis adjacent to a small bowel loop likely related to a localized perforation which sealed off. No leaking oral contrast is identified. 3. Persistent bilateral pleural effusions with overlying atelectasis. 4. Stable advanced  vascular calcifications. 5. Emphysema and aortic atherosclerosis. Aortic Atherosclerosis (ICD10-I70.0) and Emphysema (ICD10-J43.9). Electronically Signed   By: Marijo Sanes M.D.   On: 09/16/2019 14:25     Medical Consultants:    None.  Anti-Infectives:   Cefepime and Flagyl.  Subjective:    RONAL [place] reports feeling better this am, denies any worsening abdominal pain, nausea/vomiting, fever/chills. Tolerating his diet, having BMs  Objective:    Vitals:   09/16/19 0848 09/16/19 0928 09/16/19 1720 09/16/19 1737  BP: 138/60 (!) 144/68 (!) 148/61 (!) 143/65  Pulse: (!) 56 92 62 63  Resp: _0 Temp: 98.2 F (36.8 C)   97.9 F (36.6 C)  TempSrc: Oral   Oral  SpO2: 98% 98% 97% 96%  Weight:      Height:       SpO2: 96 % O2 Flow Rate (L/min): 0 L/min   Intake/Output Summary (Last 24 hours) at 09/16/2019 1846 Last data filed at 09/16/2019 1719 Gross per 24 hour  Intake 2674.24 ml  Output 2025 ml  Net 649.24 ml   Filed Weights   09/14/19 0521 09/15/19 0300 09/16/19 0500  Weight: 94 kg 94.2 kg 94.3 kg    Exam:  General: NAD   Cardiovascular: S1, S2 present  Respiratory: CTAB  Abdomen: Soft, nontender, nondistended, bowel sounds present  Musculoskeletal: No bilateral pedal edema noted  Skin: Normal  Psychiatry: Normal mood    Data Reviewed:    Labs: Basic Metabolic Panel: Recent Labs  Lab 09/11/19 0359 09/11/19 0359 09/14/19 0537 09/14/19 0537 09/15/19 0647 09/16/19 0049  NA 137  --  134*  --  135 134*  K 4.0   < > 4.3   < > 3.8 3.3*  CL 105  --  106  --  107 105  CO2 21*  --  19*  --  19* 19*  GLUCOSE 91  --  124*  --  127* 194*  BUN 38*  --  21  --  19 20  CREATININE 1.93*  --  1.58*  --  1.66* 1.76*  CALCIUM 7.8*  --  7.7*  --  7.8* 7.5*  MG  --   --  2.0  --   --   --   PHOS  --   --  2.4*  --   --   --    < > = values in this interval not displayed.   GFR Estimated Creatinine Clearance: 37.3 mL/min (A) (by C-G formula  based on SCr of 1.76 mg/dL (H)). Liver Function Tests: No results for input(s): AST, ALT, ALKPHOS, BILITOT, PROT, ALBUMIN in the last 168 hours. No results for input(s): LIPASE, AMYLASE in the last 168 hours. No results for input(s): AMMONIA in the last 168 hours. Coagulation profile No results for input(s): INR, PROTIME in the last 168 hours. COVID-19 Labs  No results for input(s): DDIMER, FERRITIN, LDH, CRP in the last 72 hours.  Lab Results  Component Value Date   [place] NEGATIVE 09/02/2019   SARSCOV2NAA NOT DETECTED 11/29/2018    CBC: Recent Labs  Lab 09/10/19 0344 09/10/19 0344 09/11/19 0359 09/13/19 0515 09/14/19 0537 09/15/19 0647 09/16/19 0049  WBC 13.4*   < > 12.6* 14.5* 11.1* 10.9* 10.4  NEUTROABS 8.5*  --  8.1*  --   --   --   --   HGB 11.6*   < > 11.4* 11.7* 11.1* 11.0* 10.2*  HCT 36.0*   < > 35.9* 36.7* 34.3* 34.2* 31.5*  MCV 93.8   < > 92.8 92.4 91.7 92.2 91.8  PLT 382   < > 378 439* 398 434* 410*   < > = values in this interval not displayed.   Cardiac Enzymes: No results for input(s): CKTOTAL, CKMB, CKMBINDEX, TROPONINI in the last 168 hours. BNP (last 3 results) No results for input(s): PROBNP in the last 8760 hours. CBG: Recent Labs  Lab 09/12/19 1230  GLUCAP 177*   D-Dimer: No results for input(s): DDIMER in the last 72 hours. Hgb A1c: No results for input(s): HGBA1C in the last 72 hours. Lipid Profile: No results for input(s): CHOL, HDL, LDLCALC, TRIG, CHOLHDL, LDLDIRECT in the last 72 hours. Thyroid function studies: No results for input(s): TSH, T4TOTAL, T3FREE, THYROIDAB in the last 72 hours.  Invalid input(s): FREET3 Anemia work up: No results for input(s): VITAMINB12, FOLATE, FERRITIN, TIBC, IRON, RETICCTPCT in the last 72 hours. Sepsis Labs: Recent Labs  Lab 09/13/19 0515 09/14/19 0537 09/15/19 0647 09/16/19 0049  WBC 14.5* 11.1* 10.9* 10.4   Microbiology No results found for this or any previous visit (from the past  240 hour(s)).   Medications:   . atorvastatin  20 mg Oral QPM  . carvedilol  25 mg Oral BID WC  . docusate sodium  100 mg Oral BID  . feeding supplement  1 Container Oral TID BM  . furosemide  40 mg Oral Daily  . isosorbide mononitrate  30 mg Oral QPM  . mexiletine  300 mg Oral BID  . pantoprazole  40 mg Oral Daily  . saccharomyces boulardii  250 mg Oral BID  . sodium chloride flush  3 mL Intravenous Q12H  . sotalol  80 mg Oral BID   Continuous Infusions: . ceFEPime (MAXIPIME) IV 2 g (09/16/19 1025)  . heparin 950 Units/hr (09/16/19 1019)  . metronidazole 500 mg (09/16/19 1500)      LOS: 14 days   Alma Friendly  [facility]  09/16/2019, 6:46 PM

## 2019-09-16 NOTE — Progress Notes (Signed)
Spoke to patient's daughter about concerns in regards to SNF rehab placement. Spoke to CSW to notify about the family's concerns.

## 2019-09-16 NOTE — TOC Progression Note (Signed)
Transition of Care [facility]) - Progression Note    Patient Details  Name: [name] MRN: [id number] Date of Birth: 1932/07/02  [facility] CM/SW [place], [place] Phone Number: 09/16/2019, 4:51 PM  Clinical Narrative:    PT assessed patient and updated recommendation to SNF. CSW consulted by RN that patient's daughter [name] had questions about SNF placement. CSW contacted patient's daughter and explained the SNF process. Daughter requested placement in the [place] area. Daughter requested a listing of facilities be provided for more information. CSW agreed to speak with patient to proceed.   CSW met with patient and spouse [name] beside. Patient provided permission to speak with spouse and daughter. Patient expressed he is in agreement with SNF placement. CSW explained insurance authorization process to family. Permission provided to fax referral out, spouse stated she would discuss with daughter. No further questions expressed at this time.   Expected Discharge Plan: Skilled Nursing Facility Barriers to Discharge: Continued Medical Work up, Ship broker  Expected Discharge Plan and Services Expected Discharge Plan: [place]   Discharge Planning Services: CM Consult Post Acute Care Choice: [place] arrangements for the past 2 months: Single Family Home                                       Social Determinants of Health (SDOH) Interventions    Readmission Risk Interventions Readmission Risk Prevention Plan 09/15/2019 12/01/2018  Transportation Screening Complete Complete  PCP or Specialist Appt within 5-7 Days - Complete  Home Care Screening - Complete  Medication Review (RN CM) - Complete  HRI or Home Care Consult Complete -  Social Work Consult for [place] Planning/Counseling Complete -  Palliative Care Screening Not Applicable -  Medication Review Press photographer)  Complete -  Some recent data might be hidden

## 2019-09-16 NOTE — Progress Notes (Signed)
ANTICOAGULATION CONSULT NOTE - Follow Up Consult  Pharmacy Consult for Heparin >> Apixaban Indication: atrial fibrillation  Allergies  Allergen Reactions  . Novocain [Procaine] Swelling    SWELLING REACTION UNSPECIFIED   . Amiodarone Nausea And Vomiting    Patient Measurements: Height: 6' (182.9 cm) Weight: 94.3 kg (207 lb 12.8 oz) IBW/kg (Calculated) : 77.6 Heparin Dosing Weight: 92 kg  Vital Signs: Temp: 97.9 F (36.6 C) (06/09 1737) Temp Source: Oral (06/09 1737) BP: 143/65 (06/09 1737) Pulse Rate: 63 (06/09 1737)  Labs: Recent Labs    09/14/19 0537 09/14/19 1530 09/15/19 0647 09/15/19 1608 09/16/19 0049  HGB 11.1*  --  11.0*  --  10.2*  HCT 34.3*  --  34.2*  --  31.5*  PLT 398  --  434*  --  410*  APTT 49*   < > 56* 101* 104*  HEPARINUNFRC 0.47  --  0.42  --  0.43  CREATININE 1.58*  --  1.66*  --  1.76*   < > = values in this interval not displayed.    Estimated Creatinine Clearance: 37.3 mL/min (A) (by C-G formula based on SCr of 1.76 mg/dL (H)).   Assessment: 86 yr old man on apixaban 2.5 mg po BID PTA for hx Afib (CHADS2VASc = 6) was admitted with recurrent diverticulitis. Repeat CT scan showed localized perforation. Pharmacy was asked to transition apixaban to IV heparin, in case procedures needed. Last dose of apixaban was 6/4 at 0931.   Pt is currently receiving heparin at 950 units/hr. Most recent H/H 10.2/31.5, platelets 410. Per RN, no issues with bleeding observed. Repeat CT shows resolving diverticulitis, no abscess. Pharmacy has been consulted to transition pt from IV heparin back to apixaban.   Age: 86 yrs, wt: 94.3 kg; Scr: 1.76  Goal of Therapy:  Prevention of stroke due to atrial fibrillation Monitor platelets by anticoagulation protocol: Yes   Plan:  Discontinue heparin infusion at 2000 tonight Start apixaban 2.5 mg PO BID at 2000 tonight Monitor daily CBC Monitor for signs/symptoms of bleeding  Gillermina Hu, PharmD, BCPS,  [facility] Clinical Pharmacist 09/16/19, 19:25 PM

## 2019-09-16 NOTE — Progress Notes (Signed)
CT scan shows resolving diverticulitis, no abscess. I reviewed the findings with the patient and his daughter, Clarene Critchley. Patient does not need any surgery or drain placement. Recommend transitioning to oral antibiotics (augmentin) x14 days. We discussed the possibility of elective surgery in a few months since this is his 4th bout. With his age and multiple medical problems he is not a great surgical candidate, but he would like to consider meeting with a colorectal surgeon to discuss. Recommend following up with PCP first for medical optimization once discharged from rehab. I will set him up with an appointment in our office in a couple months, he can call and cancel if he decides against this. We will sign off, please call with concerns.  Wellington Hampshire, [facility] 09/16/2019, 3:39 PM Please see Amion for pager number during day hours 7:00am-4:30pm

## 2019-09-16 NOTE — Progress Notes (Signed)
CCMD called to report that patient had a 25 beat run of NSVTach. Upon assessment, patient has no signs of distress and has no complaints of chest pain. Notified oncoming nurse.

## 2019-09-16 NOTE — Progress Notes (Signed)
ANTICOAGULATION CONSULT NOTE - Follow Up Consult  Pharmacy Consult for Heparin Indication: atrial fibrillation  Allergies  Allergen Reactions  . Novocain [Procaine] Swelling    SWELLING REACTION UNSPECIFIED   . Amiodarone Nausea And Vomiting    Patient Measurements: Height: 6' (182.9 cm) Weight: 94.2 kg (207 lb 10.8 oz) IBW/kg (Calculated) : 77.6 Heparin Dosing Weight: 92 kg  Vital Signs: Temp: 98.2 F (36.8 C) (06/09 0100) Temp Source: Oral (06/09 0100) BP: 133/65 (06/09 0100) Pulse Rate: 60 (06/09 0100)  Labs: Recent Labs    09/14/19 0537 09/14/19 1530 09/15/19 0647 09/15/19 1608 09/16/19 0049  HGB 11.1*  --  11.0*  --  10.2*  HCT 34.3*  --  34.2*  --  31.5*  PLT 398  --  434*  --  410*  APTT 49*   < > 56* 101* 104*  HEPARINUNFRC 0.47  --  0.42  --  0.43  CREATININE 1.58*  --  1.66*  --  1.76*   < > = values in this interval not displayed.    Estimated Creatinine Clearance: 37.2 mL/min (A) (by C-G formula based on SCr of 1.76 mg/dL (H)).   Assessment: 86 yr old man on apixaban PTA for hx Afib (CHADS2VASc = 6) was admitted with recurrent diverticulitis. Repeat CT scan showed localized perforation. Pharmacy was asked to transition apixaban to IV heparin, in case procedures needed. Last dose of apixaban was 6/4 at 0931. Will monitor anticoagulation via aPTTs until effect of apixaban on heparin level clears. Renal function has been ~stable.  aPTT ~8 hrs after heparin infusion was increased to 1000 units/hr was 101 sec, which is at the upper end of the goal range for this pt. CBC stable. Per RN, no issues with IV or bleeding observed.   6/9 AM update:  Heparin level therapeutic Will DC aPTT monitoring  Goal of Therapy:  Heparin level 0.3-0.7 units/mL Monitor platelets by anticoagulation protocol: Yes   Plan:  Cont heparin at 950 units/hr Daily CBC/HL Monitor for signs/symptoms of bleeding Follow up surgery plans and when to transition back to  apixaban  Narda Bonds, PharmD, [place] Pharmacist Phone: [phone]

## 2019-09-16 NOTE — NC FL2 (Signed)
[place] LEVEL OF CARE SCREENING TOOL     IDENTIFICATION  Patient Name: [name] Birthdate: 1933/04/01 Sex: male Admission Date (Current Location): 09/01/2019  [facility] and [place] Number:  [employer] and Address:  [facility] [place]. [facility], [place] [address] [place], [place] [address]      Provider Number: [id number]  Attending Physician Name and Address:  Alma Friendly, MD  Relative Name and Phone Number:  Jeven Topper    Current Level of Care: Hospital Recommended Level of Care: [place] Prior Approval Number:    Date Approved/Denied:   PASRR Number: [id number]  Discharge Plan: SNF    Current Diagnoses: Patient Active Problem List   Diagnosis Date Noted  . Orthostatic hypotension 09/11/2019  . Ileus ([place]) 09/04/2019  . Hyponatremia 09/03/2019  . Metabolic acidosis 60/73/7106  . Diverticulitis 09/02/2019  . Acute urinary retention 09/02/2019  . Lower extremity weakness 11/29/2018  . Multiple thyroid nodules 08/13/2017  . DCM (dilated cardiomyopathy) ([place]) 06/16/2017  . History of aortic valve replacement with bioprosthetic valve   . Cardiac resynchronization therapy defibrillator (CRT-D) in place   . Protein-calorie malnutrition, severe 05/27/2017  . Hyperthyroidism 05/26/2017  . Cough 05/26/2017  . Pleural effusion on right 05/26/2017  . Malnutrition of moderate degree 05/09/2017  . GI bleed 05/08/2017  . VT (ventricular tachycardia) ([place]) 05/06/2017  . Dizziness 04/23/2017  . Weight loss 04/23/2017  . Leg weakness 01/28/2017  . Chronic combined systolic (congestive) and diastolic (congestive) heart failure ([place])   . Shingles 08/23/2014  . Stage 4 chronic kidney disease ([place]) 08/23/2014  . Hyperkalemia 08/23/2014  . Essential hypertension 01/23/2013  . Dyslipidemia 01/23/2013  . Coronary artery disease 01/23/2013  . Persistent atrial fibrillation ([place]) 01/23/2013  . Chronic  anticoagulation 01/23/2013    Orientation RESPIRATION BLADDER Height & Weight     Self, Time, Situation, Place  Normal Continent Weight: 207 lb 12.8 oz (94.3 kg) Height:  6' (182.9 cm)  BEHAVIORAL SYMPTOMS/MOOD NEUROLOGICAL BOWEL NUTRITION STATUS      Continent Diet(See discharge summary)  AMBULATORY STATUS COMMUNICATION OF NEEDS Skin   Extensive Assist Verbally Normal                       Personal Care Assistance Level of Assistance  Bathing, Feeding, Dressing Bathing Assistance: Limited assistance Feeding assistance: Independent Dressing Assistance: Limited assistance     Functional Limitations Info  Sight, Hearing, Speech Sight Info: Impaired Hearing Info: Impaired Speech Info: Adequate    SPECIAL CARE FACTORS FREQUENCY  PT (By licensed PT), OT (By licensed OT)     PT Frequency: 5x a week OT Frequency: 5x a week            Contractures Contractures Info: Not present    Additional Factors Info  Code Status, Allergies Code Status Info: DNR Allergies Info: Novocain (procaine); Amiodarone           Current Medications (09/16/2019):  This is the current hospital active medication list Current Facility-Administered Medications  Medication Dose Route Frequency Provider Last Rate Last Admin  . acetaminophen (TYLENOL) tablet 650 mg  650 mg Oral Q6H PRN Bhagat, Bhavinkumar, PA       Or  . acetaminophen (TYLENOL) suppository 650 mg  650 mg Rectal Q6H PRN Bhagat, Bhavinkumar, PA      . albuterol (PROVENTIL) (2.5 MG/3ML) 0.083% nebulizer solution 3 mL  3 mL Inhalation Q6H PRN Bhagat, Bhavinkumar, PA      .  atorvastatin (LIPITOR) tablet 20 mg  20 mg Oral QPM Bhagat, Bhavinkumar, PA   20 mg at 09/15/19 1733  . bisacodyl (DULCOLAX) EC tablet 5 mg  5 mg Oral Daily PRN Bhagat, Bhavinkumar, PA      . carvedilol (COREG) tablet 25 mg  25 mg Oral BID WC Bhagat, Bhavinkumar, PA   25 mg at 09/16/19 0735  . ceFEPIme (MAXIPIME) 2 g in sodium chloride 0.9 % 100 mL IVPB  2 g  Intravenous Q12H Charlynne Cousins, MD 200 mL/hr at 09/16/19 0938 2 g at 09/16/19 0938  . diphenhydrAMINE (BENADRYL) injection 12.5 mg  12.5 mg Intravenous Q8H PRN Bhagat, Bhavinkumar, PA   12.5 mg at 09/03/19 1433  . docusate sodium (COLACE) capsule 100 mg  100 mg Oral BID Bhagat, Bhavinkumar, PA   100 mg at 09/16/19 0925  . feeding supplement (BOOST / RESOURCE BREEZE) liquid 1 Container  1 Container Oral TID BM Meuth, Brooke A, PA-C   1 Container at 09/16/19 1453  . furosemide (LASIX) tablet 40 mg  40 mg Oral Daily Charlynne Cousins, MD   40 mg at 09/16/19 0926  . heparin ADULT infusion 100 units/mL (25000 units/277m sodium chloride 0.45%)  950 Units/hr Intravenous Continuous FCharlynne Cousins MD 9.5 mL/hr at 09/16/19 1019 950 Units/hr at 09/16/19 1019  . HYDROcodone-acetaminophen (NORCO/VICODIN) 5-325 MG per tablet 1-2 tablet  1-2 tablet Oral Q4H PRN Bhagat, Bhavinkumar, PA   1 tablet at 09/15/19 0647  . isosorbide mononitrate (IMDUR) 24 hr tablet 30 mg  30 mg Oral QPM Bhagat, Bhavinkumar, PA   30 mg at 09/15/19 1734  . metroNIDAZOLE (FLAGYL) IVPB 500 mg  500 mg Intravenous Q8H FCharlynne Cousins MD 100 mL/hr at 09/16/19 1500 500 mg at 09/16/19 1500  . mexiletine (MEXITIL) capsule 300 mg  300 mg Oral BID Bhagat, Bhavinkumar, PA   300 mg at 09/16/19 0925  . morphine 2 MG/ML injection 2 mg  2 mg Intravenous Q2H PRN Bhagat, Bhavinkumar, PA   2 mg at 09/05/19 0616  . Muscle Rub CREA   Topical PRN FCharlynne Cousins MD   Given at 09/16/19 [id number] . ondansetron (ZOFRAN) tablet 4 mg  4 mg Oral Q6H PRN Bhagat, Bhavinkumar, PA       Or  . ondansetron (ZOFRAN) injection 4 mg  4 mg Intravenous Q6H PRN Bhagat, Bhavinkumar, PA   4 mg at 09/05/19 0751  . pantoprazole (PROTONIX) EC tablet 40 mg  40 mg Oral Daily Bhagat, Bhavinkumar, PA   40 mg at 09/16/19 0925  . phenol (CHLORASEPTIC) mouth spray 1 spray  1 spray Mouth/Throat PRN Bhagat, Bhavinkumar, PA   1 spray at 09/06/19 1953  . polyethylene  glycol (MIRALAX / GLYCOLAX) packet 17 g  17 g Oral Daily PRN Bhagat, Bhavinkumar, PA      . potassium chloride SA (KLOR-CON) CR tablet 40 mEq  40 mEq Oral Q4H EAlma Friendly MD   40 mEq at 09/16/19 1308  . saccharomyces boulardii (FLORASTOR) capsule 250 mg  250 mg Oral BID Meuth, Brooke A, PA-C      . sodium chloride flush (NS) 0.9 % injection 3 mL  3 mL Intravenous Q12H Bhagat, Bhavinkumar, PA   3 mL at 09/16/19 0929  . sotalol (BETAPACE) tablet 80 mg  80 mg Oral BID Bhagat, Bhavinkumar, PA   80 mg at 09/16/19 0926  . traMADol (ULTRAM) tablet 50 mg  50 mg Oral Q12H PRN FCharlynne Cousins MD      .  zolpidem (AMBIEN) tablet 5 mg  5 mg Oral QHS PRN Bhagat, Bhavinkumar, PA   5 mg at 09/06/19 2323     Discharge Medications: Please see discharge summary for a list of discharge medications.  Relevant Imaging Results:  Relevant Lab Results:   Additional Information SSN [id number]  Arvella Merles, [place]

## 2019-09-16 NOTE — Progress Notes (Signed)
Physical Therapy Treatment Patient Details Name: [name] MRN: [id number] DOB: 05-Mar-1933 Today's Date: 09/16/2019    History of Present Illness 86 yo male presented to ED 09/02/19 with onset of generalized weakness after a flare of his diverticulitis with metabolic acidosis, acute urinary retention.  No perf on CT, severe diverticulitis on non-contrast CT 09/02/19. Pt currently being monitored for recurring Vtach that started 5/29. PMHx: TAVR, a-fib, CKD4, CHF, AICD, HTN, CAD with CABG, diverticulitis.    PT Comments    Pt admitted with above diagnosis. Pt was less orthostatic than last visit but still has orthostasis. Pt admits frustration as he feels that he is not going to be able to go home and be as I as he needs to be.  Discussed that pt may need REhab and pt agrees that he needs SNF Rehab prior to going home.  Updated d/c plan. Also pt not meeting goals due to medical issues therefore revised all goals today. Pt currently with functional limitations due to balance and endurance deficits. Pt will benefit from skilled PT to increase their independence and safety with mobility to allow discharge to the venue listed below.    Orthostatic BPs  Supine 137/78, 59 bpm   Sitting 142/68, 63 bpm  Standing 132/109, 73 bpm  Standing after 3 min 105/49, 71 bpm     Follow Up Recommendations  SNF;Supervision/Assistance - 24 hour     Equipment Recommendations  Other (comment)(TBD)    Recommendations for Other Services       Precautions / Restrictions Precautions Precautions: Fall Restrictions Weight Bearing Restrictions: No    Mobility  Bed Mobility Overal bed mobility: Modified Independent Bed Mobility: Supine to Sit     Supine to sit: Modified independent (Device/Increase time) Sit to supine: Min guard   General bed mobility comments: Increased time to transition, use of bed rails and controls minimally  Transfers Overall transfer level: Needs assistance Equipment used:  Rolling walker (2 wheeled) Transfers: Sit to/from Stand Sit to Stand: Min assist;From elevated surface         General transfer comment: Pt remains othrostatic but not dropping with each position just with standing for 3 minutes.  Pt feeling very discouraged stating he probably cant go home at current level as he has gotten weak due to the issues with orthostasis.  Did take steps to [facility] prior to getting back in. Had to leave in bed as CT was planned at 11 am.   Ambulation/Gait                 Stairs             Wheelchair Mobility    Modified Rankin (Stroke Patients Only)       Balance Overall balance assessment: Needs assistance Sitting-balance support: Feet supported;No upper extremity supported Sitting balance-Leahy Scale: Fair     Standing balance support: Bilateral upper extremity supported;During functional activity Standing balance-Leahy Scale: Poor Standing balance comment: Pt relies on UE support but light support needed with RW.                             Cognition Arousal/Alertness: Awake/alert Behavior During Therapy: WFL for tasks assessed/performed Overall Cognitive Status: Within Functional Limits for tasks assessed  Exercises      General Comments General comments (skin integrity, edema, etc.): See orthostatic VS.      Pertinent Vitals/Pain Pain Assessment: Faces Faces Pain Scale: Hurts a little bit Pain Location: generalized with movment Pain Descriptors / Indicators: Discomfort;Guarding Pain Intervention(s): Limited activity within patient's tolerance;Monitored during session;Repositioned    Home Living                      Prior Function            PT Goals (current goals can now be found in the care plan section) Acute Rehab PT Goals Patient Stated Goal: to get back to my garden PT Goal Formulation: With patient Time For Goal Achievement:  09/30/19 Potential to Achieve Goals: Good Progress towards PT goals: Not progressing toward goals - comment;Goals downgraded-see care plan    Frequency    Min 3X/week      PT Plan Discharge plan needs to be updated    Co-evaluation              AM-PAC PT "6 Clicks" Mobility   Outcome Measure  Help needed turning from your back to your side while in a flat bed without using bedrails?: A Little Help needed moving from lying on your back to sitting on the side of a flat bed without using bedrails?: A Little Help needed moving to and from a bed to a chair (including a wheelchair)?: A Little Help needed standing up from a chair using your arms (e.g., wheelchair or bedside chair)?: A Lot Help needed to walk in hospital room?: A Lot Help needed climbing 3-5 steps with a railing? : A Lot 6 Click Score: 15    End of Session Equipment Utilized During Treatment: Gait belt Activity Tolerance: Patient limited by fatigue;Other (comment)(limited by dizziness) Patient left: with call bell/phone within reach;in bed Nurse Communication: Mobility status PT Visit Diagnosis: Unsteadiness on feet (R26.81);Muscle weakness (generalized) (M62.81);Difficulty in walking, not elsewhere classified (R26.2);Ataxic gait (R26.0);Pain Pain - Right/Left: (abdomen) Pain - part of body: (abdomen)     Time: 1023-1040 PT Time Calculation (min) (ACUTE ONLY): 17 min  Charges:  $Therapeutic Activity: 8-22 mins                     Delaine Canter W,PT Acute Rehabilitation Services Pager:  [phone]  Office:  [place] 09/16/2019, 1:11 PM

## 2019-09-17 LAB — BASIC METABOLIC PANEL
Anion gap: 9 (ref 5–15)
BUN: 18 mg/dL (ref 8–23)
CO2: 21 mmol/L — ABNORMAL LOW (ref 22–32)
Calcium: 8 mg/dL — ABNORMAL LOW (ref 8.9–10.3)
Chloride: 104 mmol/L (ref 98–111)
Creatinine, Ser: 1.89 mg/dL — ABNORMAL HIGH (ref 0.61–1.24)
GFR calc Af Amer: 37 mL/min — ABNORMAL LOW (ref >60)
GFR calc non Af Amer: 32 mL/min — ABNORMAL LOW (ref >60)
Glucose, Bld: 124 mg/dL — ABNORMAL HIGH (ref 70–99)
Potassium: 3.8 mmol/L (ref 3.5–5.1)
Sodium: 134 mmol/L — ABNORMAL LOW (ref 135–145)

## 2019-09-17 LAB — CBC
HCT: 34.7 % — ABNORMAL LOW (ref 39.0–52.0)
Hemoglobin: 10.9 g/dL — ABNORMAL LOW (ref 13.0–17.0)
MCH: 29.1 pg (ref 26.0–34.0)
MCHC: 31.4 g/dL (ref 30.0–36.0)
MCV: 92.8 fL (ref 80.0–100.0)
Platelets: 468 10*3/uL — ABNORMAL HIGH (ref 150–400)
RBC: 3.74 MIL/uL — ABNORMAL LOW (ref 4.22–5.81)
RDW: 15.9 % — ABNORMAL HIGH (ref 11.5–15.5)
WBC: 10.6 10*3/uL — ABNORMAL HIGH (ref 4.0–10.5)
nRBC: 0 % (ref 0.0–0.2)

## 2019-09-17 LAB — SARS CORONAVIRUS 2 (TAT 6-24 HRS): SARS Coronavirus 2: NEGATIVE

## 2019-09-17 LAB — MAGNESIUM: Magnesium: 1.8 mg/dL (ref 1.7–2.4)

## 2019-09-17 MED ORDER — MAGNESIUM SULFATE 2 GM/50ML IV SOLN
2.0000 g | Freq: Once | INTRAVENOUS | Status: AC
  Administered 2019-09-17: 2 g via INTRAVENOUS
  Filled 2019-09-17: qty 50

## 2019-09-17 NOTE — TOC Progression Note (Addendum)
Transition of Care [facility]) - Progression Note    Patient Details  Name: [name] MRN: [id number] Date of Birth: 1932/06/05  Transition of Care [facility]) CM/SW [place], [place] Phone Number: 09/17/2019, 3:02 PM  Clinical Narrative:     CSW provided bed offers to patient's daughter. Daughter requested CSW follow-up with [facility], [facility], and [facility]. CSW also updated patient's wife. CSW will follow-up.  Expected Discharge Plan: Skilled Nursing Facility Barriers to Discharge: Continued Medical Work up, Ship broker  Expected Discharge Plan and Services Expected Discharge Plan: [place]   Discharge Planning Services: CM Consult Post Acute Care Choice: [place] arrangements for the past 2 months: Single Family Home                                       Social Determinants of Health (SDOH) Interventions    Readmission Risk Interventions Readmission Risk Prevention Plan 09/15/2019 12/01/2018  Transportation Screening Complete Complete  PCP or Specialist Appt within 5-7 Days - Complete  Home Care Screening - Complete  Medication Review (RN CM) - Complete  HRI or Home Care Consult Complete -  Social Work Consult for [place] Planning/Counseling Complete -  Palliative Care Screening Not Applicable -  Medication Review Press photographer) Complete -  Some recent data might be hidden

## 2019-09-17 NOTE — Progress Notes (Addendum)
Electrophysiology Rounding Note  Patient Name: [name] Date of Encounter: 09/17/2019  Primary Cardiologist: Radford Pax Electrophysiologist: Curt Bears   Subjective   The patient is doing well today.  At this time, the patient denies chest pain, shortness of breath, or any new concerns.  He feels that he is close to going home.   Inpatient Medications    Scheduled Meds: . apixaban  2.5 mg Oral BID  . atorvastatin  20 mg Oral QPM  . carvedilol  25 mg Oral BID WC  . docusate sodium  100 mg Oral BID  . feeding supplement  1 Container Oral TID BM  . furosemide  40 mg Oral Daily  . isosorbide mononitrate  30 mg Oral QPM  . mexiletine  300 mg Oral BID  . pantoprazole  40 mg Oral Daily  . saccharomyces boulardii  250 mg Oral BID  . sodium chloride flush  3 mL Intravenous Q12H  . sotalol  80 mg Oral BID   Continuous Infusions: . ceFEPime (MAXIPIME) IV 2 g (09/17/19 1001)  . magnesium sulfate bolus IVPB 2 g (09/17/19 1425)  . metronidazole 500 mg (09/17/19 0656)   PRN Meds: acetaminophen **OR** acetaminophen, albuterol, bisacodyl, diphenhydrAMINE, HYDROcodone-acetaminophen, morphine injection, Muscle Rub, ondansetron **OR** ondansetron (ZOFRAN) IV, phenol, polyethylene glycol, traMADol, zolpidem   Vital Signs    Vitals:   09/17/19 0449 09/17/19 0752 09/17/19 0946 09/17/19 1253  BP:  (!) 144/57 (!) 145/63 (!) 152/79  Pulse:  (!) 58 (!) 56 63  Resp:  19  19  Temp:  97.6 F (36.4 C)  98.5 F (36.9 C)  TempSrc:  Oral  Oral  SpO2:  100%  97%  Weight: 93.1 kg     Height:        Intake/Output Summary (Last 24 hours) at 09/17/2019 1457 Last data filed at 09/17/2019 1258 Gross per 24 hour  Intake 1111.34 ml  Output 2990 ml  Net -1878.66 ml   Filed Weights   09/15/19 0300 09/16/19 0500 09/17/19 0449  Weight: 94.2 kg 94.3 kg 93.1 kg    Physical Exam    GEN- The patient is well appearing, alert and oriented x 3 today.   Head- normocephalic, atraumatic Eyes-  Sclera  clear, conjunctiva pink Ears- hearing intact Oropharynx- clear Neck- supple Lungs- Clear to ausculation bilaterally, normal work of breathing Heart- Regular rate and rhythm  GI- soft, NT, ND, + BS Extremities- no clubbing, cyanosis, or edema Skin- no rash or lesion Psych- euthymic mood, full affect Neuro- strength and sensation are intact  Labs    CBC Recent Labs    09/16/19 0049 09/17/19 0539  WBC 10.4 10.6*  HGB 10.2* 10.9*  HCT 31.5* 34.7*  MCV 91.8 92.8  PLT 410* 749*   Basic Metabolic Panel Recent Labs    09/16/19 0049 09/17/19 0539 09/17/19 0737  NA 134* 134*  --   K 3.3* 3.8  --   CL 105 104  --   CO2 19* 21*  --   GLUCOSE 194* 124*  --   BUN 20 18  --   CREATININE 1.76* 1.89*  --   CALCIUM 7.5* 8.0*  --   MG  --   --  1.8     Telemetry    SR with V pacing, short runs NSVT  (personally reviewed)  Radiology    CT ABDOMEN PELVIS WO CONTRAST  Result Date: 09/16/2019 CLINICAL DATA:  Abdominal distension. Follow-up diverticulitis and localized perforation. EXAM: CT ABDOMEN AND PELVIS WITHOUT  CONTRAST TECHNIQUE: Multidetector CT imaging of the abdomen and pelvis was performed following the standard protocol without IV contrast. COMPARISON:  CT scans 6/1 and 09/11/2019 FINDINGS: Lower chest: Persistent bilateral pleural effusions with overlying atelectasis. The heart is normal in size. Stable small hiatal hernia. Stable emphysematous changes. Hepatobiliary: No focal hepatic lesions or intrahepatic biliary dilatation. The gallbladder is unremarkable. No common bile duct dilatation. Pancreas: No mass, inflammation or ductal dilatation. Spleen: Normal size.  No focal lesions. Adrenals/Urinary Tract: The adrenal glands and kidneys are unremarkable and stable. No renal, ureteral or bladder calculi or mass. Stomach/Bowel: The stomach, duodenum and small bowel are unremarkable. No acute inflammatory changes, mass lesions or obstructive findings. Severe changes of sigmoid  colon diverticulosis. Resolving changes of diverticulitis. No residual significant inflammatory changes. No diverticular abscess. There is a persistent focus of loculated air noted in the pelvis adjacent to a small bowel loop likely related to a localized perforation which sealed off. No leaking oral contrast is identified. Vascular/Lymphatic: Stable advanced vascular calcifications. No aneurysm. No mesenteric or retroperitoneal mass or adenopathy. No pelvic adenopathy or inguinal adenopathy. There are small bilateral inguinal hernias containing fat. Reproductive: The prostate gland and seminal vesicles are unremarkable. Other: Small amount of presacral fluid/edema appears stable. No pelvic abscess. Musculoskeletal: No significant bony findings. IMPRESSION: 1. Resolving changes of diverticulitis.  No diverticular abscess. 2. Persistent focus of loculated air in the pelvis adjacent to a small bowel loop likely related to a localized perforation which sealed off. No leaking oral contrast is identified. 3. Persistent bilateral pleural effusions with overlying atelectasis. 4. Stable advanced vascular calcifications. 5. Emphysema and aortic atherosclerosis. Aortic Atherosclerosis (ICD10-I70.0) and Emphysema (ICD10-J43.9). Electronically Signed   By: Marijo Sanes M.D.   On: 09/16/2019 14:25     Patient Profile     86 y.o. male malewith a hx of atrial fibrillation coronary artery disease status post CABG x3 with AVR, CKD stage IV, chronic systolic and diastolic heart failure with an ejection fraction of 25 to 30%, VT, PAFib  Ad[facility] to [facility] with severe diverticulitis placed on IV fluids and he improved. He was worsened when he started a liquid diet and was found to have an ileus. An NG tube was placed (remains)  Developed VT (below his detection)   Device information MDT CRT-D, implanted1/13/17 + hx of appropriate therapy and VT AAD Hx: Amiodarone stopped Jan 2019, 2/2 SOB, No clear finding of amio  tox >> mexiletine   Assessment & Plan    1. Ventricular tachycardia He has had NSVT on telemetry this admission Continue Sotalol and Mexiletine Episodes are not prolonged enough to warrant therapy QTc is stable in the setting of paced QRS and ICD in place Keep K >3.9, Mg >1.8 Avoid QT prolonging drugs He has very close follow up scheduled with Dr Curt Bears   2.  Chronic systolic heart failure Fluid status appears stable Continue current therapy  3.  Paroxysmal atrial fibrillation No recent recurrence Continue Eliquis long term  [facility] will sign off.   Medication Recommendations:  As above Other recommendations (labs, testing, etc):  none Follow up as an outpatient:  Scheduled   For questions or updates, please contact [place] Please consult [email or web address] for contact info under Cardiology/STEMI.  Signed, Chanetta Marshall, NP  09/17/2019, 2:57 PM   I have seen, examined the patient, and reviewed the above assessment and plan.  Changes to above are made where necessary.  On exam, RRR.  Chronically ill and frail.  VT is nonsustained.  Continue current medicines. No further inpatient EP workup planned.  Thompson Grayer MD, [place]

## 2019-09-17 NOTE — Progress Notes (Signed)
Inpatient Rehabilitation-Admissions Coordinator   Note recommendations are now for SNF and pt is agreeable to this. AC will sign off.   Raechel Ache, OTR/L  Rehab Admissions Coordinator  [phone] 09/17/2019 8:00 AM

## 2019-09-17 NOTE — Progress Notes (Signed)
[facility] PROGRESS NOTE    Progress Note  [name]  CBU:[id number] DOB: 10-18-1932 DOA: 09/01/2019 PCP: [name], MD     Brief Narrative:   [name] is an 86 y.o. male admitted to the hospital with acute diverticulitis and nonsustained V. tach and ileus, has been managed conservatively with NG tube placement. X-ray was obtained that showed dilated loops.  Assessment/Plan:   Acute severe diverticulitis/ileus Currently afebrile, with no leukocytosis Tolerating diet, having BMs Repeat CT abdomen/pelvis on 09/16/19 shows resolving diverticulitis, no abscess Continue IV cefepime and Flagyl General surgery consulted, plan for outpt follow up, d/c on oral AB x 14 days  NSVT/bradycardia/Qtc prolongation Noted to have about 25 beats of NSVT on 6/9 Noted to be bradycardic since 6/10 EKG done with QTc prolongation  EP re-consulted for further management, rec to continue sotalol and mexiletine, ICD in place. Has close follow up    Hypokalemia Replace prn  Chronic combined systolic and diastolic heart failure Continue daily standing weights, strict I's and O's, restrict his fluids to 1500 cc resume his home dose of Lasix. Lower extremity Dopplers negative for DVT.  Chronic hyponatremia and metabolic acidosis Daily BMP  Chronic kidney disease stage IV Creatinine remained at baseline. With a baseline creatinine of 1.9-2.3 Daily BMP  Anemia of chronic kidney disease Hemoglobin at baseline Daily CBC  Chronic atrial fibrillation HR controlled Continue Coreg, plan to restart Eliquis and d/c heparin No surgical intervention planned this admission Chads Vasc score greater than 2.  Urinary retention Appears to be resolved  New orthostatic hypotension Resolved D/C home off hydralazine.  Severe protein caloric malnutrition His prealbumin is low, getting boost supplements.   Continue Ensure daily     DVT prophylaxis: IV heparin Family  Communication:none Status is: Inpatient  Remains inpatient appropriate because:Hemodynamically unstable and IV treatments appropriate due to intensity of illness or inability to take PO   Dispo: The patient is from: Home              Anticipated d/c is to: SNF              Anticipated d/c date is: 1 day              Patient currently is medically stable to d/c.     Code Status:     Code Status Orders  (From admission, onward)         Start     Ordered   09/02/19 1119  Do not attempt resuscitation (DNR)  Continuous    Question Answer Comment  In the event of cardiac or respiratory ARREST Do not call a "code blue"   In the event of cardiac or respiratory ARREST Do not perform Intubation, CPR, defibrillation or ACLS   In the event of cardiac or respiratory ARREST Use medication by any route, position, wound care, and other measures to relive pain and suffering. May use oxygen, suction and manual treatment of airway obstruction as needed for comfort.      09/02/19 1121        Code Status History    Date Active Date Inactive Code Status Order ID Comments User Context   11/29/2018 0321 12/01/2018 2053 Full Code [id number]  Albertine Patricia, MD ED   06/15/2017 2214 06/20/2017 1940 Full Code [id number]  Marcie Mowers, MD ED   05/26/2017 1733 06/04/2017 2006 Full Code [id number]  Vashti Hey, MD ED   05/08/2017 1516 05/10/2017 1808 Full Code [id number]  Johnney Ou ED   06/16/2015 1305 06/19/2015 1646 Full Code [id number]  Albertine Patricia, MD ED   04/30/2015 2256 05/02/2015 1852 Full Code [id number]  Kinnie Feil, MD Inpatient   04/22/2015 1504 04/23/2015 1457 Full Code [id number]  Constance Haw, MD Inpatient   11/04/2014 0853 11/04/2014 1454 Full Code [id number]  Leonie Man, MD Inpatient   08/23/2014 0117 08/26/2014 1646 Full Code [id number]  Rise Patience, MD Inpatient   Advance Care Planning Activity        IV Access:     Peripheral IV   Procedures and diagnostic studies:   CT ABDOMEN PELVIS WO CONTRAST  Result Date: 09/16/2019 CLINICAL DATA:  Abdominal distension. Follow-up diverticulitis and localized perforation. EXAM: CT ABDOMEN AND PELVIS WITHOUT CONTRAST TECHNIQUE: Multidetector CT imaging of the abdomen and pelvis was performed following the standard protocol without IV contrast. COMPARISON:  CT scans 6/1 and 09/11/2019 FINDINGS: Lower chest: Persistent bilateral pleural effusions with overlying atelectasis. The heart is normal in size. Stable small hiatal hernia. Stable emphysematous changes. Hepatobiliary: No focal hepatic lesions or intrahepatic biliary dilatation. The gallbladder is unremarkable. No common bile duct dilatation. Pancreas: No mass, inflammation or ductal dilatation. Spleen: Normal size.  No focal lesions. Adrenals/Urinary Tract: The adrenal glands and kidneys are unremarkable and stable. No renal, ureteral or bladder calculi or mass. Stomach/Bowel: The stomach, duodenum and small bowel are unremarkable. No acute inflammatory changes, mass lesions or obstructive findings. Severe changes of sigmoid colon diverticulosis. Resolving changes of diverticulitis. No residual significant inflammatory changes. No diverticular abscess. There is a persistent focus of loculated air noted in the pelvis adjacent to a small bowel loop likely related to a localized perforation which sealed off. No leaking oral contrast is identified. Vascular/Lymphatic: Stable advanced vascular calcifications. No aneurysm. No mesenteric or retroperitoneal mass or adenopathy. No pelvic adenopathy or inguinal adenopathy. There are small bilateral inguinal hernias containing fat. Reproductive: The prostate gland and seminal vesicles are unremarkable. Other: Small amount of presacral fluid/edema appears stable. No pelvic abscess. Musculoskeletal: No significant bony findings. IMPRESSION: 1. Resolving changes of diverticulitis.  No  diverticular abscess. 2. Persistent focus of loculated air in the pelvis adjacent to a small bowel loop likely related to a localized perforation which sealed off. No leaking oral contrast is identified. 3. Persistent bilateral pleural effusions with overlying atelectasis. 4. Stable advanced vascular calcifications. 5. Emphysema and aortic atherosclerosis. Aortic Atherosclerosis (ICD10-I70.0) and Emphysema (ICD10-J43.9). Electronically Signed   By: Marijo Sanes M.D.   On: 09/16/2019 14:25     Medical Consultants:    None.  Anti-Infectives:   Cefepime and Flagyl.  Subjective:    [name] reports feeling a little dizzy this am, otherwise denies any new complaints. No abdominal pain, N/V, fever/chills, continues to tolerate his diet.  Objective:    Vitals:   09/17/19 0752 09/17/19 0946 09/17/19 1253 09/17/19 1550  BP: (!) 144/57 (!) 145/63 (!) 152/79 (!) 155/68  Pulse: (!) 58 (!) 56 63 (!) 59  Resp: _0 Temp: 97.6 F (36.4 C)  98.5 F (36.9 C) 98.2 F (36.8 C)  TempSrc: Oral  Oral Oral  SpO2: 100%  97% 95%  Weight:      Height:       SpO2: 95 % O2 Flow Rate (L/min): 0 L/min   Intake/Output Summary (Last 24 hours) at 09/17/2019 1735 Last data filed at 09/17/2019 1541 Gross per 24 hour  Intake 814.39 ml  Output 2995  ml  Net -2180.61 ml   Filed Weights   09/15/19 0300 09/16/19 0500 09/17/19 0449  Weight: 94.2 kg 94.3 kg 93.1 kg    Exam:  General: NAD   Cardiovascular: S1, S2 present  Respiratory: CTAB  Abdomen: Soft, nontender, nondistended, bowel sounds present  Musculoskeletal: No bilateral pedal edema noted  Skin: Normal  Psychiatry: Normal mood    Data Reviewed:    Labs: Basic Metabolic Panel: Recent Labs  Lab 09/11/19 0359 09/11/19 0359 09/14/19 0537 09/14/19 0537 09/15/19 0647 09/15/19 0647 09/16/19 0049 09/17/19 0539 09/17/19 0737  NA 137  --  134*  --  135  --  134* 134*  --   K 4.0   < > 4.3   < > 3.8   < > 3.3* 3.8   --   CL 105  --  106  --  107  --  105 104  --   CO2 21*  --  19*  --  19*  --  19* 21*  --   GLUCOSE 91  --  124*  --  127*  --  194* 124*  --   BUN 38*  --  21  --  19  --  20 18  --   CREATININE 1.93*  --  1.58*  --  1.66*  --  1.76* 1.89*  --   CALCIUM 7.8*  --  7.7*  --  7.8*  --  7.5* 8.0*  --   MG  --   --  2.0  --   --   --   --   --  1.8  PHOS  --   --  2.4*  --   --   --   --   --   --    < > = values in this interval not displayed.   GFR Estimated Creatinine Clearance: 31.9 mL/min (A) (by C-G formula based on SCr of 1.89 mg/dL (H)). Liver Function Tests: No results for input(s): AST, ALT, ALKPHOS, BILITOT, PROT, ALBUMIN in the last 168 hours. No results for input(s): LIPASE, AMYLASE in the last 168 hours. No results for input(s): AMMONIA in the last 168 hours. Coagulation profile No results for input(s): INR, PROTIME in the last 168 hours. COVID-19 Labs  No results for input(s): DDIMER, FERRITIN, LDH, CRP in the last 72 hours.  Lab Results  Component Value Date   [place] NEGATIVE 09/17/2019   [place] NEGATIVE 09/02/2019   SARSCOV2NAA NOT DETECTED 11/29/2018    CBC: Recent Labs  Lab 09/11/19 0359 09/11/19 0359 09/13/19 0515 09/14/19 0537 09/15/19 0647 09/16/19 0049 09/17/19 0539  WBC 12.6*   < > 14.5* 11.1* 10.9* 10.4 10.6*  NEUTROABS 8.1*  --   --   --   --   --   --   HGB 11.4*   < > 11.7* 11.1* 11.0* 10.2* 10.9*  HCT 35.9*   < > 36.7* 34.3* 34.2* 31.5* 34.7*  MCV 92.8   < > 92.4 91.7 92.2 91.8 92.8  PLT 378   < > 439* 398 434* 410* 468*   < > = values in this interval not displayed.   Cardiac Enzymes: No results for input(s): CKTOTAL, CKMB, CKMBINDEX, TROPONINI in the last 168 hours. BNP (last 3 results) No results for input(s): PROBNP in the last 8760 hours. CBG: Recent Labs  Lab 09/12/19 1230  GLUCAP 177*   D-Dimer: No results for input(s): DDIMER in the last 72 hours. Hgb A1c: No results for input(s):  HGBA1C in the last 72  hours. Lipid Profile: No results for input(s): CHOL, HDL, LDLCALC, TRIG, CHOLHDL, LDLDIRECT in the last 72 hours. Thyroid function studies: No results for input(s): TSH, T4TOTAL, T3FREE, THYROIDAB in the last 72 hours.  Invalid input(s): FREET3 Anemia work up: No results for input(s): VITAMINB12, FOLATE, FERRITIN, TIBC, IRON, RETICCTPCT in the last 72 hours. Sepsis Labs: Recent Labs  Lab 09/14/19 0537 09/15/19 0647 09/16/19 0049 09/17/19 0539  WBC 11.1* 10.9* 10.4 10.6*   Microbiology Recent Results (from the past 240 hour(s))  SARS CORONAVIRUS 2 (TAT 6-24 HRS) Nasopharyngeal Nasopharyngeal Swab     Status: None   Collection Time: 09/17/19  4:50 AM   Specimen: Nasopharyngeal Swab  Result Value Ref Range Status   SARS Coronavirus 2 NEGATIVE NEGATIVE Final    Comment: (NOTE) SARS-CoV-2 target nucleic acids are NOT DETECTED.  The SARS-CoV-2 RNA is generally detectable in upper and lower respiratory specimens during the acute phase of infection. Negative results do not preclude SARS-CoV-2 infection, do not rule out co-infections with other pathogens, and should not be used as the sole basis for treatment or other patient management decisions. Negative results must be combined with clinical observations, patient history, and epidemiological information. The expected result is Negative.  Fact Sheet for Patients: SugarRoll.be  Fact Sheet for Healthcare Providers: https://www.woods-mathews.com/  This test is not yet approved or cleared by the [place] FDA and  has been authorized for detection and/or diagnosis of SARS-CoV-2 by FDA under an Emergency Use Authorization (EUA). This EUA will remain  in effect (meaning this test can be used) for the duration of the COVID-19 declaration under Se ction 564(b)(1) of the Act, 21 U.S.C. section 360bbb-3(b)(1), unless the authorization is terminated or revoked sooner.  Performed at [facility], [address], [place], [place] [address]      Medications:   . apixaban  2.5 mg Oral BID  . atorvastatin  20 mg Oral QPM  . carvedilol  25 mg Oral BID WC  . docusate sodium  100 mg Oral BID  . feeding supplement  1 Container Oral TID BM  . furosemide  40 mg Oral Daily  . isosorbide mononitrate  30 mg Oral QPM  . mexiletine  300 mg Oral BID  . pantoprazole  40 mg Oral Daily  . saccharomyces boulardii  250 mg Oral BID  . sodium chloride flush  3 mL Intravenous Q12H  . sotalol  80 mg Oral BID   Continuous Infusions: . ceFEPime (MAXIPIME) IV 2 g (09/17/19 1001)  . metronidazole 500 mg (09/17/19 1541)      LOS: 15 days   Alma Friendly  [facility]  09/17/2019, 5:35 PM

## 2019-09-17 NOTE — Care Management Important Message (Signed)
Important Message  Patient Details  Name: JAVARIOUS ELSAYED MRN: [id number] Date of Birth: 11/28/1932   Medicare Important Message Given:  Yes     [name] 09/17/2019, 10:16 AM

## 2019-09-18 ENCOUNTER — Inpatient Hospital Stay (HOSPITAL_COMMUNITY): Payer: Medicare HMO

## 2019-09-18 LAB — BASIC METABOLIC PANEL
Anion gap: 6 (ref 5–15)
BUN: 21 mg/dL (ref 8–23)
CO2: 23 mmol/L (ref 22–32)
Calcium: 8.1 mg/dL — ABNORMAL LOW (ref 8.9–10.3)
Chloride: 104 mmol/L (ref 98–111)
Creatinine, Ser: 1.86 mg/dL — ABNORMAL HIGH (ref 0.61–1.24)
GFR calc Af Amer: 38 mL/min — ABNORMAL LOW (ref >60)
GFR calc non Af Amer: 32 mL/min — ABNORMAL LOW (ref >60)
Glucose, Bld: 136 mg/dL — ABNORMAL HIGH (ref 70–99)
Potassium: 3.8 mmol/L (ref 3.5–5.1)
Sodium: 133 mmol/L — ABNORMAL LOW (ref 135–145)

## 2019-09-18 LAB — MAGNESIUM: Magnesium: 2.2 mg/dL (ref 1.7–2.4)

## 2019-09-18 MED ORDER — ADULT MULTIVITAMIN W/MINERALS CH
1.0000 | ORAL_TABLET | Freq: Every day | ORAL | Status: DC
  Administered 2019-09-18 – 2019-09-23 (×6): 1 via ORAL
  Filled 2019-09-18 (×6): qty 1

## 2019-09-18 MED ORDER — DM-GUAIFENESIN ER 30-600 MG PO TB12
1.0000 | ORAL_TABLET | Freq: Two times a day (BID) | ORAL | Status: DC | PRN
  Administered 2019-09-18 – 2019-09-23 (×6): 1 via ORAL
  Filled 2019-09-18 (×6): qty 1

## 2019-09-18 MED ORDER — OMEGA-3-ACID ETHYL ESTERS 1 G PO CAPS
1.0000 g | ORAL_CAPSULE | Freq: Two times a day (BID) | ORAL | Status: DC
  Administered 2019-09-18 – 2019-09-23 (×10): 1 g via ORAL
  Filled 2019-09-18 (×10): qty 1

## 2019-09-18 MED ORDER — FISH OIL 1000 MG PO CAPS: 2000.0000 mg | ORAL_CAPSULE | Freq: Two times a day (BID) | ORAL | Status: DC

## 2019-09-18 MED ORDER — POTASSIUM CHLORIDE CRYS ER 20 MEQ PO TBCR
40.0000 meq | EXTENDED_RELEASE_TABLET | Freq: Once | ORAL | Status: AC
  Administered 2019-09-18: 40 meq via ORAL
  Filled 2019-09-18: qty 2

## 2019-09-18 NOTE — Progress Notes (Signed)
Physical Therapy Treatment Patient Details Name: [name] MRN: [id number] DOB: 01-Sep-1932 Today's Date: 09/18/2019    History of Present Illness 86 yo male presented to ED 09/02/19 with onset of generalized weakness after a flare of his diverticulitis with metabolic acidosis, acute urinary retention.  No perf on CT, severe diverticulitis on non-contrast CT 09/02/19. Pt currently being monitored for recurring Vtach that started 5/29. PMHx: TAVR, a-fib, CKD4, CHF, AICD, HTN, CAD with CABG, diverticulitis.    PT Comments    Pt was seen for mobility with more gait tolerated today, and has better standing balance control with RW.  He is getting unstable at hip level, tending to give out at hips to nearly sit down at times.  Follow up with pt to work on LE strength, balance skills and endurance activities to increase gait independence.     Follow Up Recommendations  SNF;Supervision/Assistance - 24 hour     Equipment Recommendations  Other (comment)    Recommendations for Other Services Rehab consult     Precautions / Restrictions Precautions Precautions: Fall Precaution Comments: fall risk,  Restrictions Weight Bearing Restrictions: No    Mobility  Bed Mobility Overal bed mobility: Modified Independent Bed Mobility: Supine to Sit     Supine to sit: Modified independent (Device/Increase time) Sit to supine: Min guard   General bed mobility comments: uses bedrails to get OOB  Transfers Overall transfer level: Needs assistance Equipment used: Rolling walker (2 wheeled) Transfers: Sit to/from Stand Sit to Stand: Min assist         General transfer comment: no dizziness  Ambulation/Gait Ambulation/Gait assistance: Min guard Gait Distance (Feet): 40 Feet Assistive device: Rolling walker (2 wheeled) Gait Pattern/deviations: Step-to pattern;Wide base of support;Decreased stride length Gait velocity: reduced Gait velocity interpretation: <1.31 ft/sec, indicative of  household ambulator General Gait Details: sidesteps on side of bed, RW with postural correction   Stairs             Wheelchair Mobility    Modified Rankin (Stroke Patients Only)       Balance     Sitting balance-Leahy Scale: Good       Standing balance-Leahy Scale: Poor                              Cognition Arousal/Alertness: Awake/alert Behavior During Therapy: WFL for tasks assessed/performed Overall Cognitive Status: Within Functional Limits for tasks assessed                                        Exercises General Exercises - Lower Extremity Ankle Circles/Pumps: AROM;5 reps Quad Sets: AROM;10 reps Hip ABduction/ADduction: AROM;10 reps Straight Leg Raises: AROM;10 reps    General Comments General comments (skin integrity, edema, etc.): pt was assisted to stand and walk on side of bed then up to chair side of bed      Pertinent Vitals/Pain Pain Assessment: Faces Faces Pain Scale: Hurts a little bit Pain Location: generalized pain Pain Descriptors / Indicators: Guarding Pain Intervention(s): Monitored during session;Repositioned    Home Living                      Prior Function            PT Goals (current goals can now be found in the care plan section)  Frequency    Min 3X/week      PT Plan Current plan remains appropriate    Co-evaluation              AM-PAC PT "6 Clicks" Mobility   Outcome Measure  Help needed turning from your back to your side while in a flat bed without using bedrails?: A Little Help needed moving from lying on your back to sitting on the side of a flat bed without using bedrails?: A Little Help needed moving to and from a bed to a chair (including a wheelchair)?: A Little Help needed standing up from a chair using your arms (e.g., wheelchair or bedside chair)?: A Little Help needed to walk in hospital room?: A Little Help needed climbing 3-5 steps with a  railing? : A Lot 6 Click Score: 17    End of Session Equipment Utilized During Treatment: Gait belt Activity Tolerance: Patient limited by fatigue Patient left: in chair;with call bell/phone within reach;with chair alarm set Nurse Communication: Mobility status PT Visit Diagnosis: Unsteadiness on feet (R26.81);Muscle weakness (generalized) (M62.81);Difficulty in walking, not elsewhere classified (R26.2);Ataxic gait (R26.0);Pain     Time: 1081-0653 PT Time Calculation (min) (ACUTE ONLY): 36 min  Charges:  $Gait Training: 8-22 mins $Therapeutic Exercise: 8-22 mins                Ramond Dial 09/18/2019, 8:40 PM  Mee Hives, PT MS Acute Rehab Dept. Number: [place] [facility] [place]

## 2019-09-18 NOTE — Progress Notes (Signed)
[facility] PROGRESS NOTE    Progress Note  [name]  QVZ:[id number] DOB: 04/30/1932 DOA: 09/01/2019 PCP: [name], MD     Brief Narrative:   [name] is an 86 y.o. male admitted to the hospital with acute diverticulitis and nonsustained V. tach and ileus, has been managed conservatively with NG tube placement. X-ray was obtained that showed dilated loops.  Assessment/Plan:   Acute severe diverticulitis/ileus Currently afebrile, with resolving leukocytosis Tolerating diet, having BMs Repeat CT abdomen/pelvis on 09/16/19 shows resolving diverticulitis, no abscess Continue IV cefepime and Flagyl General surgery consulted, plan for outpt follow up, d/c on oral AB x 14 days  NSVT/bradycardia/Qtc prolongation Noted to have about 25 beats of NSVT on 6/9 Noted to be bradycardic since 6/10 EKG done with QTc prolongation  EP re-consulted for further management, rec to continue sotalol and mexiletine, ICD in place. Has close follow up    Hypokalemia Replace prn  Chronic combined systolic and diastolic heart failure Continue daily standing weights, strict I's and O's, restrict his fluids to 1500 cc resume his home dose of Lasix. Lower extremity Dopplers negative for DVT.  Chronic hyponatremia and metabolic acidosis Daily BMP  Chronic kidney disease stage IV Creatinine remained at baseline. With a baseline creatinine of 1.9-2.3 Daily BMP  Anemia of chronic kidney disease Hemoglobin at baseline Daily CBC  Chronic atrial fibrillation HR controlled Continue Coreg, Eliquis Chads Vasc score greater than 2  Urinary retention Appears to be resolved  New orthostatic hypotension Resolved D/C home off hydralazine.  Severe protein caloric malnutrition His prealbumin is low, getting boost supplements.   Continue Ensure daily     DVT prophylaxis: IV heparin Family Communication:none Status is: Inpatient  Remains inpatient appropriate  because:Hemodynamically unstable and IV treatments appropriate due to intensity of illness or inability to take PO   Dispo: The patient is from: Home              Anticipated d/c is to: SNF              Anticipated d/c date is: 1 day              Patient currently is medically stable to d/c.     Code Status:     Code Status Orders  (From admission, onward)         Start     Ordered   09/02/19 1119  Do not attempt resuscitation (DNR)  Continuous    Question Answer Comment  In the event of cardiac or respiratory ARREST Do not call a "code blue"   In the event of cardiac or respiratory ARREST Do not perform Intubation, CPR, defibrillation or ACLS   In the event of cardiac or respiratory ARREST Use medication by any route, position, wound care, and other measures to relive pain and suffering. May use oxygen, suction and manual treatment of airway obstruction as needed for comfort.      09/02/19 1121        Code Status History    Date Active Date Inactive Code Status Order ID Comments User Context   11/29/2018 1455 12/01/2018 2053 Full Code [id number]  Albertine Patricia, MD ED   06/15/2017 2214 06/20/2017 1940 Full Code [id number]  Marcie Mowers, MD ED   05/26/2017 1733 06/04/2017 2006 Full Code [id number]  Vashti Hey, MD ED   05/08/2017 1516 05/10/2017 1808 Full Code [id number]  Brenton Grills, PA-C ED   06/16/2015 1305 06/19/2015 1646 Full  Code [id number]  Elgergawy, Silver Huguenin, MD ED   04/30/2015 2256 05/02/2015 1852 Full Code [id number]  Kinnie Feil, MD Inpatient   04/22/2015 1504 04/23/2015 1457 Full Code [id number]  Constance Haw, MD Inpatient   11/04/2014 0853 11/04/2014 1454 Full Code [id number]  Leonie Man, MD Inpatient   08/23/2014 0117 08/26/2014 1646 Full Code [id number]  Rise Patience, MD Inpatient   Advance Care Planning Activity        IV Access:    Peripheral IV   Procedures and diagnostic studies:   No results  found.   Medical Consultants:    None.  Anti-Infectives:   Cefepime and Flagyl.  Subjective:    [name] denies any new complaints, wants to d/c home, discussed extensively why the need for SNF.  Objective:    Vitals:   09/18/19 0111 09/18/19 0548 09/18/19 0721 09/18/19 1108  BP: (!) 147/69 (!) 136/55 (!) 154/51 131/61  Pulse: 65 61 67 (!) 59  Resp: _0 Temp: 97.8 F (36.6 C) 98.5 F (36.9 C) 98.2 F (36.8 C) 98.6 F (37 C)  TempSrc: Oral Oral Oral Oral  SpO2: 98% 98% 98% 97%  Weight: 91.6 kg     Height:       SpO2: 97 % O2 Flow Rate (L/min): 0 L/min   Intake/Output Summary (Last 24 hours) at 09/18/2019 1458 Last data filed at 09/18/2019 1431 Gross per 24 hour  Intake 1853 ml  Output 3400 ml  Net -1547 ml   Filed Weights   09/16/19 0500 09/17/19 0449 09/18/19 0111  Weight: 94.3 kg 93.1 kg 91.6 kg    Exam:  General: NAD   Cardiovascular: S1, S2 present  Respiratory: CTAB  Abdomen: Soft, nontender, nondistended, bowel sounds present  Musculoskeletal: 1+ bilateral pedal edema noted  Skin: Normal  Psychiatry: Normal mood    Data Reviewed:    Labs: Basic Metabolic Panel: Recent Labs  Lab 09/14/19 0537 09/14/19 0537 09/15/19 0647 09/15/19 0647 09/16/19 0049 09/16/19 0049 09/17/19 0539 09/17/19 0737 09/18/19 0658  NA 134*  --  135  --  134*  --  134*  --  133*  K 4.3   < > 3.8   < > 3.3*   < > 3.8  --  3.8  CL 106  --  107  --  105  --  104  --  104  CO2 19*  --  19*  --  19*  --  21*  --  23  GLUCOSE 124*  --  127*  --  194*  --  124*  --  136*  BUN 21  --  19  --  20  --  18  --  21  CREATININE 1.58*  --  1.66*  --  1.76*  --  1.89*  --  1.86*  CALCIUM 7.7*  --  7.8*  --  7.5*  --  8.0*  --  8.1*  MG 2.0  --   --   --   --   --   --  1.8 2.2  PHOS 2.4*  --   --   --   --   --   --   --   --    < > = values in this interval not displayed.   GFR Estimated Creatinine Clearance: 32.4 mL/min (A) (by C-G formula based  on SCr of 1.86 mg/dL (H)). Liver Function Tests: No results for input(s): AST, ALT, ALKPHOS,  BILITOT, PROT, ALBUMIN in the last 168 hours. No results for input(s): LIPASE, AMYLASE in the last 168 hours. No results for input(s): AMMONIA in the last 168 hours. Coagulation profile No results for input(s): INR, PROTIME in the last 168 hours. COVID-19 Labs  No results for input(s): DDIMER, FERRITIN, LDH, CRP in the last 72 hours.  Lab Results  Component Value Date   SARSCOV2NAA NEGATIVE 09/17/2019   [place] NEGATIVE 09/02/2019   SARSCOV2NAA NOT DETECTED 11/29/2018    CBC: Recent Labs  Lab 09/13/19 0515 09/14/19 0537 09/15/19 0647 09/16/19 0049 09/17/19 0539  WBC 14.5* 11.1* 10.9* 10.4 10.6*  HGB 11.7* 11.1* 11.0* 10.2* 10.9*  HCT 36.7* 34.3* 34.2* 31.5* 34.7*  MCV 92.4 91.7 92.2 91.8 92.8  PLT 439* 398 434* 410* 468*   Cardiac Enzymes: No results for input(s): CKTOTAL, CKMB, CKMBINDEX, TROPONINI in the last 168 hours. BNP (last 3 results) No results for input(s): PROBNP in the last 8760 hours. CBG: Recent Labs  Lab 09/12/19 1230  GLUCAP 177*   D-Dimer: No results for input(s): DDIMER in the last 72 hours. Hgb A1c: No results for input(s): HGBA1C in the last 72 hours. Lipid Profile: No results for input(s): CHOL, HDL, LDLCALC, TRIG, CHOLHDL, LDLDIRECT in the last 72 hours. Thyroid function studies: No results for input(s): TSH, T4TOTAL, T3FREE, THYROIDAB in the last 72 hours.  Invalid input(s): FREET3 Anemia work up: No results for input(s): VITAMINB12, FOLATE, FERRITIN, TIBC, IRON, RETICCTPCT in the last 72 hours. Sepsis Labs: Recent Labs  Lab 09/14/19 0537 09/15/19 0647 09/16/19 0049 09/17/19 0539  WBC 11.1* 10.9* 10.4 10.6*   Microbiology Recent Results (from the past 240 hour(s))  SARS CORONAVIRUS 2 (TAT 6-24 HRS) Nasopharyngeal Nasopharyngeal Swab     Status: None   Collection Time: 09/17/19  4:50 AM   Specimen: Nasopharyngeal Swab  Result Value  Ref Range Status   SARS Coronavirus 2 NEGATIVE NEGATIVE Final    Comment: (NOTE) SARS-CoV-2 target nucleic acids are NOT DETECTED.  The SARS-CoV-2 RNA is generally detectable in upper and lower respiratory specimens during the acute phase of infection. Negative results do not preclude SARS-CoV-2 infection, do not rule out co-infections with other pathogens, and should not be used as the sole basis for treatment or other patient management decisions. Negative results must be combined with clinical observations, patient history, and epidemiological information. The expected result is Negative.  Fact Sheet for Patients: SugarRoll.be  Fact Sheet for Healthcare Providers: https://www.woods-mathews.com/  This test is not yet approved or cleared by the [place] FDA and  has been authorized for detection and/or diagnosis of SARS-CoV-2 by FDA under an Emergency Use Authorization (EUA). This EUA will remain  in effect (meaning this test can be used) for the duration of the COVID-19 declaration under Se ction 564(b)(1) of the Act, 21 U.S.C. section 360bbb-3(b)(1), unless the authorization is terminated or revoked sooner.  Performed at [facility], [place] [address], [place], [place] [address]      Medications:   . apixaban  2.5 mg Oral BID  . atorvastatin  20 mg Oral QPM  . carvedilol  25 mg Oral BID WC  . docusate sodium  100 mg Oral BID  . feeding supplement  1 Container Oral TID BM  . furosemide  40 mg Oral Daily  . isosorbide mononitrate  30 mg Oral QPM  . mexiletine  300 mg Oral BID  . pantoprazole  40 mg Oral Daily  . saccharomyces boulardii  250 mg Oral  BID  . sodium chloride flush  3 mL Intravenous Q12H  . sotalol  80 mg Oral BID   Continuous Infusions: . ceFEPime (MAXIPIME) IV 2 g (09/18/19 1038)  . metronidazole 500 mg (09/18/19 1435)      LOS: 16 days   Alma Friendly  [facility]  09/18/2019, 2:58  PM

## 2019-09-18 NOTE — TOC Progression Note (Addendum)
Transition of Care [facility]) - Progression Note    Patient Details  Name: [name] MRN: [id number] Date of Birth: October 16, 1932  [facility]) CM/SW [place], [place] Phone Number: 09/18/2019, 1:32 PM  Clinical Narrative:    Update: Daughter aware of all bed offers at this time. Faxed referral to [facility]. Please follow-up with daughter on bed choice.  CSW contacted patient's daughter [name] to get bed choice, with permission provided by patient/ patient spouse. CSW left a voicemail, will need follow-up on choice.  Expected Discharge Plan: Skilled Nursing Facility Barriers to Discharge: Continued Medical Work up, Ship broker  Expected Discharge Plan and Services Expected Discharge Plan: [place]   Discharge Planning Services: CM Consult Post Acute Care Choice: [place] arrangements for the past 2 months: Single Family Home                                       Social Determinants of Health (SDOH) Interventions    Readmission Risk Interventions Readmission Risk Prevention Plan 09/15/2019 12/01/2018  Transportation Screening Complete Complete  PCP or Specialist Appt within 5-7 Days - Complete  Home Care Screening - Complete  Medication Review (RN CM) - Complete  HRI or Home Care Consult Complete -  Social Work Consult for [place] Planning/Counseling Complete -  Palliative Care Screening Not Applicable -  Medication Review Press photographer) Complete -  Some recent data might be hidden

## 2019-09-18 NOTE — Progress Notes (Signed)
Per CCMD pt had a 17 run of V-Tach.  Nurse assessed pt , pt denies chest pain or shortness of breath.  Vitals are as follows.  EKG completed.  Family member at bedside.     09/18/19 1825  Vitals  Temp 98.2 F (36.8 C)  Temp Source Oral  BP (!) 116/53  MAP (mmHg) 72  BP Location Right Arm  BP Method Automatic  Patient Position (if appropriate) Lying  Pulse Rate 64  Pulse Rate Source Monitor  Resp 18  Oxygen Therapy  SpO2 98 %  O2 Device Room Air  MEWS Score  MEWS Temp 0  MEWS Systolic 0  MEWS Pulse 0  MEWS RR 0  MEWS LOC 0  MEWS Score 0  MEWS Score Color Green    Pt is currently in bed, pt asymptomatic.  Horris Latino MD notified.

## 2019-09-19 LAB — BASIC METABOLIC PANEL
Anion gap: 9 (ref 5–15)
BUN: 27 mg/dL — ABNORMAL HIGH (ref 8–23)
CO2: 20 mmol/L — ABNORMAL LOW (ref 22–32)
Calcium: 8.1 mg/dL — ABNORMAL LOW (ref 8.9–10.3)
Chloride: 104 mmol/L (ref 98–111)
Creatinine, Ser: 1.92 mg/dL — ABNORMAL HIGH (ref 0.61–1.24)
GFR calc Af Amer: 36 mL/min — ABNORMAL LOW (ref >60)
GFR calc non Af Amer: 31 mL/min — ABNORMAL LOW (ref >60)
Glucose, Bld: 138 mg/dL — ABNORMAL HIGH (ref 70–99)
Potassium: 4.4 mmol/L (ref 3.5–5.1)
Sodium: 133 mmol/L — ABNORMAL LOW (ref 135–145)

## 2019-09-19 LAB — MAGNESIUM: Magnesium: 2 mg/dL (ref 1.7–2.4)

## 2019-09-19 MED ORDER — MEXILETINE HCL 150 MG PO CAPS
300.0000 mg | ORAL_CAPSULE | Freq: Three times a day (TID) | ORAL | Status: DC
  Administered 2019-09-19 – 2019-09-22 (×10): 300 mg via ORAL
  Filled 2019-09-19 (×12): qty 2

## 2019-09-19 NOTE — Progress Notes (Signed)
[facility] PROGRESS NOTE    Progress Note  [name]  BOF:[id number] DOB: February 26, 1933 DOA: 09/01/2019 PCP: [name], MD     Brief Narrative:   [name] is an 86 y.o. male admitted to the hospital with acute diverticulitis and nonsustained V. tach and ileus, has been managed conservatively with NG tube placement. X-ray was obtained that showed dilated loops.  Assessment/Plan:   Acute severe diverticulitis/ileus Currently afebrile, with resolving leukocytosis Tolerating diet, having BMs Repeat CT abdomen/pelvis on 09/16/19 shows resolving diverticulitis, no abscess Continue IV cefepime and Flagyl General surgery consulted, plan for outpt follow up, d/c on oral AB x 14 days  NSVT/bradycardia/Qtc prolongation Noted to have about 25 beats of NSVT on 6/9 Noted to be bradycardic since 6/10 EKG done with QTc prolongation  EP re-consulted for further management, rec to continue sotalol and mexiletine, ICD in place. Has close follow up  Of note, pt has be getting mexiletine BID instead of TID (daughter and patient insisting it must be changed back to TID, no indication that it was ever decreased upon chart review and speaking to pharmacy)  Hypokalemia Replace prn  Chronic combined systolic and diastolic heart failure Continue daily standing weights, strict I's and O's, restrict his fluids to 1500 cc resume his home dose of Lasix. Lower extremity Dopplers negative for DVT.  Chronic hyponatremia and metabolic acidosis Daily BMP  Chronic kidney disease stage IV Creatinine remained at baseline. With a baseline creatinine of 1.9-2.3 Daily BMP  Anemia of chronic kidney disease Hemoglobin at baseline Daily CBC  Chronic atrial fibrillation HR controlled Continue Coreg, Eliquis Chads Vasc score greater than 2  Urinary retention Appears to be resolved  New orthostatic hypotension Resolved D/C home off hydralazine.  Severe protein caloric  malnutrition His prealbumin is low, getting boost supplements.   Continue Ensure daily     DVT prophylaxis: IV heparin Family Communication: Discussed extensively with daughter on 09/19/19 Status is: Inpatient  Remains inpatient appropriate because:Hemodynamically unstable and IV treatments appropriate due to intensity of illness or inability to take PO   Dispo: The patient is from: Home              Anticipated d/c is to: SNF              Anticipated d/c date is: 1 day              Patient currently is medically stable to d/c.     Code Status:     Code Status Orders  (From admission, onward)         Start     Ordered   09/02/19 1119  Do not attempt resuscitation (DNR)  Continuous    Question Answer Comment  In the event of cardiac or respiratory ARREST Do not call a "code blue"   In the event of cardiac or respiratory ARREST Do not perform Intubation, CPR, defibrillation or ACLS   In the event of cardiac or respiratory ARREST Use medication by any route, position, wound care, and other measures to relive pain and suffering. May use oxygen, suction and manual treatment of airway obstruction as needed for comfort.      09/02/19 1121        Code Status History    Date Active Date Inactive Code Status Order ID Comments User Context   11/29/2018 1455 12/01/2018 2053 Full Code [id number]  Albertine Patricia, MD ED   06/15/2017 2214 06/20/2017 1940 Full Code [id number]  [place],  Earnstine Regal, MD ED   05/26/2017 1733 06/04/2017 2006 Full Code [id number]  Vashti Hey, MD ED   05/08/2017 1516 05/10/2017 1808 Full Code [id number]  Brenton Grills, PA-C ED   06/16/2015 1305 06/19/2015 1646 Full Code [id number]  Elgergawy, Silver Huguenin, MD ED   04/30/2015 2256 05/02/2015 1852 Full Code [id number]  Kinnie Feil, MD Inpatient   04/22/2015 1504 04/23/2015 1457 Full Code [id number]  Constance Haw, MD Inpatient   11/04/2014 0853 11/04/2014 1454 Full Code [id number]  Leonie Man, MD Inpatient   08/23/2014 0117 08/26/2014 1646 Full Code [id number]  Rise Patience, MD Inpatient   Advance Care Planning Activity        IV Access:    Peripheral IV   Procedures and diagnostic studies:   DG Chest Port 1 View  Result Date: 09/18/2019 CLINICAL DATA:  Cough. EXAM: PORTABLE CHEST 1 VIEW COMPARISON:  Included lung bases from abdominal CT 09/16/2019. Chest radiograph 09/11/2017, chest CT 06/26/2018 FINDINGS: Multi lead left-sided pacemaker remains in place. Post median sternotomy. Upper normal heart size. Unchanged mediastinal contours. Aortic atherosclerosis. Hazy bilateral lung base opacities likely represent pleural effusions and adjacent atelectasis. No pulmonary edema. No pneumothorax. IMPRESSION: Hazy bilateral lung base opacities likely represent pleural effusions and adjacent atelectasis. Aortic Atherosclerosis (ICD10-I70.0). Electronically Signed   By: Keith Rake M.D.   On: 09/18/2019 20:59     Medical Consultants:    None.  Anti-Infectives:   Cefepime and Flagyl.  Subjective:    [name] denies any new complaints, wanting his mexiletine changed to TID. No new complaints. Awaiting SNF placement.   Objective:    Vitals:   09/19/19 0431 09/19/19 0736 09/19/19 1152 09/19/19 1627  BP: (!) 157/69 (!) 141/93 125/72 137/61  Pulse: (!) 58 (!) 50 (!) 59 60  Resp: _0 Temp: 97.6 F (36.4 C) 97.8 F (36.6 C) 97.8 F (36.6 C) 98 F (36.7 C)  TempSrc: Oral Oral    SpO2: 98% 97% 98% 98%  Weight:      Height:       SpO2: 98 % O2 Flow Rate (L/min): 0 L/min   Intake/Output Summary (Last 24 hours) at 09/19/2019 1731 Last data filed at 09/19/2019 1627 Gross per 24 hour  Intake 900 ml  Output 2950 ml  Net -2050 ml   Filed Weights   09/17/19 0449 09/18/19 0111 09/19/19 0053  Weight: 93.1 kg 91.6 kg 91.9 kg    Exam:  General: NAD   Cardiovascular: S1, S2 present  Respiratory: CTAB  Abdomen: Soft, nontender,  nondistended, bowel sounds present  Musculoskeletal: 1+ bilateral pedal edema noted  Skin: Normal  Psychiatry: Normal mood    Data Reviewed:    Labs: Basic Metabolic Panel: Recent Labs  Lab 09/14/19 0537 09/14/19 0537 09/15/19 0647 09/15/19 0647 09/16/19 0049 09/16/19 0049 09/17/19 0539 09/17/19 0539 09/17/19 0737 09/18/19 0658 09/19/19 0647  NA 134*   < > 135  --  134*  --  134*  --   --  133* 133*  K 4.3   < > 3.8   < > 3.3*   < > 3.8   < >  --  3.8 4.4  CL 106   < > 107  --  105  --  104  --   --  104 104  CO2 19*   < > 19*  --  19*  --  21*  --   --  23 20*  GLUCOSE 124*   < > 127*  --  194*  --  124*  --   --  136* 138*  BUN 21   < > 19  --  20  --  18  --   --  21 27*  CREATININE 1.58*   < > 1.66*  --  1.76*  --  1.89*  --   --  1.86* 1.92*  CALCIUM 7.7*   < > 7.8*  --  7.5*  --  8.0*  --   --  8.1* 8.1*  MG 2.0  --   --   --   --   --   --   --  1.8 2.2 2.0  PHOS 2.4*  --   --   --   --   --   --   --   --   --   --    < > = values in this interval not displayed.   GFR Estimated Creatinine Clearance: 31.4 mL/min (A) (by C-G formula based on SCr of 1.92 mg/dL (H)). Liver Function Tests: No results for input(s): AST, ALT, ALKPHOS, BILITOT, PROT, ALBUMIN in the last 168 hours. No results for input(s): LIPASE, AMYLASE in the last 168 hours. No results for input(s): AMMONIA in the last 168 hours. Coagulation profile No results for input(s): INR, PROTIME in the last 168 hours. COVID-19 Labs  No results for input(s): DDIMER, FERRITIN, LDH, CRP in the last 72 hours.  Lab Results  Component Value Date   SARSCOV2NAA NEGATIVE 09/17/2019   [place] NEGATIVE 09/02/2019   SARSCOV2NAA NOT DETECTED 11/29/2018    CBC: Recent Labs  Lab 09/13/19 0515 09/14/19 0537 09/15/19 0647 09/16/19 0049 09/17/19 0539  WBC 14.5* 11.1* 10.9* 10.4 10.6*  HGB 11.7* 11.1* 11.0* 10.2* 10.9*  HCT 36.7* 34.3* 34.2* 31.5* 34.7*  MCV 92.4 91.7 92.2 91.8 92.8  PLT 439* 398 434*  410* 468*   Cardiac Enzymes: No results for input(s): CKTOTAL, CKMB, CKMBINDEX, TROPONINI in the last 168 hours. BNP (last 3 results) No results for input(s): PROBNP in the last 8760 hours. CBG: No results for input(s): GLUCAP in the last 168 hours. D-Dimer: No results for input(s): DDIMER in the last 72 hours. Hgb A1c: No results for input(s): HGBA1C in the last 72 hours. Lipid Profile: No results for input(s): CHOL, HDL, LDLCALC, TRIG, CHOLHDL, LDLDIRECT in the last 72 hours. Thyroid function studies: No results for input(s): TSH, T4TOTAL, T3FREE, THYROIDAB in the last 72 hours.  Invalid input(s): FREET3 Anemia work up: No results for input(s): VITAMINB12, FOLATE, FERRITIN, TIBC, IRON, RETICCTPCT in the last 72 hours. Sepsis Labs: Recent Labs  Lab 09/14/19 0537 09/15/19 0647 09/16/19 0049 09/17/19 0539  WBC 11.1* 10.9* 10.4 10.6*   Microbiology Recent Results (from the past 240 hour(s))  SARS CORONAVIRUS 2 (TAT 6-24 HRS) Nasopharyngeal Nasopharyngeal Swab     Status: None   Collection Time: 09/17/19  4:50 AM   Specimen: Nasopharyngeal Swab  Result Value Ref Range Status   SARS Coronavirus 2 NEGATIVE NEGATIVE Final    Comment: (NOTE) SARS-CoV-2 target nucleic acids are NOT DETECTED.  The SARS-CoV-2 RNA is generally detectable in upper and lower respiratory specimens during the acute phase of infection. Negative results do not preclude SARS-CoV-2 infection, do not rule out co-infections with other pathogens, and should not be used as the sole basis for treatment or other patient management decisions. Negative results must be combined with clinical observations, patient history, and epidemiological information. The expected  result is Negative.  Fact Sheet for Patients: SugarRoll.be  Fact Sheet for Healthcare Providers: https://www.woods-mathews.com/  This test is not yet approved or cleared by the [place] FDA and  has  been authorized for detection and/or diagnosis of SARS-CoV-2 by FDA under an Emergency Use Authorization (EUA). This EUA will remain  in effect (meaning this test can be used) for the duration of the COVID-19 declaration under Se ction 564(b)(1) of the Act, 21 U.S.C. section 360bbb-3(b)(1), unless the authorization is terminated or revoked sooner.  Performed at [facility], [place] [address], [place], [place] [address]      Medications:   . apixaban  2.5 mg Oral BID  . atorvastatin  20 mg Oral QPM  . carvedilol  25 mg Oral BID WC  . docusate sodium  100 mg Oral BID  . feeding supplement  1 Container Oral TID BM  . furosemide  40 mg Oral Daily  . isosorbide mononitrate  30 mg Oral QPM  . mexiletine  300 mg Oral Q8H  . multivitamin with minerals  1 tablet Oral Daily  . omega-3 acid ethyl esters  1 g Oral BID  . pantoprazole  40 mg Oral Daily  . saccharomyces boulardii  250 mg Oral BID  . sodium chloride flush  3 mL Intravenous Q12H  . sotalol  80 mg Oral BID   Continuous Infusions: . ceFEPime (MAXIPIME) IV 2 g (09/19/19 1029)  . metronidazole 500 mg (09/19/19 1517)      LOS: 17 days   Alma Friendly  [facility]  09/19/2019, 5:31 PM

## 2019-09-19 NOTE — Progress Notes (Signed)
Pharmacy Antibiotic Note  [name] is a 86 y.o. male admitted on 09/01/2019 with intra-abdominal infection.  Pharmacy consulted 09/02/19 for cefepime dosing. Metronidazole added 5/26.   Patient is afebrile and appears to be improving slowly. Continues to be afebrile, WBC 10.6. 6/4 CT abdomen with likley ileus, diverticulitis with localized sigmoid perforation. Renal function stable. Planning Augmentin x 14 days after IV course complete.   Plan: Continue cefepime 2 gm IV Q 12 hours through 09/22/19. Continue metronidazole 500 mg IV Q8h through 09/22/2019 Monitor CBC, renal fx, cultures and clinical progress  F/u repeat CT scan 6/15   Height: 6' (182.9 cm) Weight: 91.9 kg (202 lb 11.2 oz) (scale b) IBW/kg (Calculated) : 77.6  Temp (24hrs), Avg:97.9 F (36.6 C), Min:97.3 F (36.3 C), Max:98.6 F (37 C)  Recent Labs  Lab 09/13/19 0515 09/14/19 0537 09/14/19 0537 09/15/19 0647 09/16/19 0049 09/17/19 0539 09/18/19 0658 09/19/19 0647  WBC 14.5* 11.1*  --  10.9* 10.4 10.6*  --   --   CREATININE  --  1.58*   < > 1.66* 1.76* 1.89* 1.86* 1.92*   < > = values in this interval not displayed.    Estimated Creatinine Clearance: 31.4 mL/min (A) (by C-G formula based on SCr of 1.92 mg/dL (H)).    Allergies  Allergen Reactions  . Novocain [Procaine] Swelling    SWELLING REACTION UNSPECIFIED   . Amiodarone Nausea And Vomiting    Antimicrobials this admission: Cefepime 5/26 >> 6/8 Metronidazole 5/26>>6/8  Dose adjustments this admission: Cefepime 2gm q24 to 2gm IV q12h  Microbiology results: 5/26 BCx: (anaerobic): (diphtheroids/corynebacterium > likely contaminant) 5/26 UCx:  Multiple species   [place] Crane, Vena Austria, BCPS, [facility] Clinical Pharmacist  09/19/2019 9:03 AM   [facility] pharmacy phone numbers are listed on [email or web address]   Please check AMION for all [place] phone numbers After 10:00 PM, call [place] [phone]

## 2019-09-19 NOTE — TOC Progression Note (Signed)
Transition of Care [facility]) - Progression Note    Patient Details  Name: [name] MRN: [id number] Date of Birth: January 22, 1933  Transition of Care [facility]) CM/SW [place], [place] Phone Number: [phone] 09/19/2019, 1:29 PM  Clinical Narrative:     CSW spoke with patient's daughter [name] and she chose [facility]. CSW spoke with Juliann Pulse at [facility] and she stated that they could accept him.CSW called [facility] and CSW attempted to follow up on insurance authorization however an eligible ticket was inititated.   TOC team will continue to follow for discharge planning needs.   Expected Discharge Plan: Skilled Nursing Facility Barriers to Discharge: Continued Medical Work up, Ship broker  Expected Discharge Plan and Services Expected Discharge Plan: [place]   Discharge Planning Services: CM Consult Post Acute Care Choice: [place] arrangements for the past 2 months: Single Family Home                                       Social Determinants of Health (SDOH) Interventions    Readmission Risk Interventions Readmission Risk Prevention Plan 09/15/2019 12/01/2018  Transportation Screening Complete Complete  PCP or Specialist Appt within 5-7 Days - Complete  Home Care Screening - Complete  Medication Review (RN CM) - Complete  HRI or Home Care Consult Complete -  Social Work Consult for [place] Planning/Counseling Complete -  Palliative Care Screening Not Applicable -  Medication Review Press photographer) Complete -  Some recent data might be hidden

## 2019-09-20 LAB — MAGNESIUM: Magnesium: 1.8 mg/dL (ref 1.7–2.4)

## 2019-09-20 LAB — BASIC METABOLIC PANEL
Anion gap: 12 (ref 5–15)
BUN: 28 mg/dL — ABNORMAL HIGH (ref 8–23)
CO2: 18 mmol/L — ABNORMAL LOW (ref 22–32)
Calcium: 8.1 mg/dL — ABNORMAL LOW (ref 8.9–10.3)
Chloride: 103 mmol/L (ref 98–111)
Creatinine, Ser: 1.86 mg/dL — ABNORMAL HIGH (ref 0.61–1.24)
GFR calc Af Amer: 38 mL/min — ABNORMAL LOW (ref >60)
GFR calc non Af Amer: 32 mL/min — ABNORMAL LOW (ref >60)
Glucose, Bld: 171 mg/dL — ABNORMAL HIGH (ref 70–99)
Potassium: 4.1 mmol/L (ref 3.5–5.1)
Sodium: 133 mmol/L — ABNORMAL LOW (ref 135–145)

## 2019-09-20 MED ORDER — SODIUM BICARBONATE 650 MG PO TABS
650.0000 mg | ORAL_TABLET | Freq: Two times a day (BID) | ORAL | Status: AC
  Administered 2019-09-20 – 2019-09-22 (×4): 650 mg via ORAL
  Filled 2019-09-20 (×5): qty 1

## 2019-09-20 NOTE — Progress Notes (Signed)
[facility] PROGRESS NOTE    Progress Note  [name]  QBH:[id number] DOB: Aug 13, 1932 DOA: 09/01/2019 PCP: [name], MD     Brief Narrative:   [name] is an 86 y.o. male admitted to the hospital with acute diverticulitis and nonsustained V. tach and ileus, has been managed conservatively with NG tube placement. X-ray was obtained that showed dilated loops.  Assessment/Plan:   Acute severe diverticulitis/ileus Currently afebrile, with resolving leukocytosis Tolerating diet, having BMs Repeat CT abdomen/pelvis on 09/16/19 shows resolving diverticulitis, no abscess Continue IV cefepime and Flagyl General surgery consulted, plan for outpt follow up, d/c on oral AB x 14 days  NSVT/bradycardia/Qtc prolongation Noted to have about 25 beats of NSVT on 6/9 Noted to be bradycardic since 6/10 EKG done with QTc prolongation  EP re-consulted for further management, rec to continue sotalol and mexiletine, ICD in place. Has close follow up  Of note, pt has be getting mexiletine BID instead of TID (daughter and patient insisting it must be changed back to TID, no indication that it was ever decreased upon chart review and speaking to pharmacy)  Hypokalemia Replace prn  Chronic combined systolic and diastolic heart failure Continue daily standing weights, strict I's and O's, restrict his fluids to 1500 cc resume his home dose of Lasix. Lower extremity Dopplers negative for DVT.  Chronic hyponatremia and metabolic acidosis Daily BMP  Chronic kidney disease stage IV Creatinine remained at baseline. With a baseline creatinine of 1.9-2.3 Daily BMP  Anemia of chronic kidney disease Hemoglobin at baseline Daily CBC  Chronic atrial fibrillation HR controlled Continue Coreg, Eliquis Chads Vasc score greater than 2  Urinary retention Appears to be resolved  New orthostatic hypotension Resolved D/C home off hydralazine.  Severe protein caloric  malnutrition His prealbumin is low, getting boost supplements.   Continue Ensure daily     DVT prophylaxis: IV heparin Family Communication: Discussed extensively with daughter on 09/20/19 Status is: Inpatient  Remains inpatient appropriate because:Hemodynamically unstable and IV treatments appropriate due to intensity of illness or inability to take PO   Dispo: The patient is from: Home              Anticipated d/c is to: SNF              Anticipated d/c date is: 1 day              Patient currently is medically stable to d/c.     Code Status:     Code Status Orders  (From admission, onward)         Start     Ordered   09/02/19 1119  Do not attempt resuscitation (DNR)  Continuous    Question Answer Comment  In the event of cardiac or respiratory ARREST Do not call a code blue   In the event of cardiac or respiratory ARREST Do not perform Intubation, CPR, defibrillation or ACLS   In the event of cardiac or respiratory ARREST Use medication by any route, position, wound care, and other measures to relive pain and suffering. May use oxygen, suction and manual treatment of airway obstruction as needed for comfort.      09/02/19 1121        Code Status History    Date Active Date Inactive Code Status Order ID Comments User Context   11/29/2018 1455 12/01/2018 2053 Full Code [id number]  Albertine Patricia, MD ED   06/15/2017 2214 06/20/2017 1940 Full Code [id number]  [place],  Earnstine Regal, MD ED   05/26/2017 1733 06/04/2017 2006 Full Code [id number]  Vashti Hey, MD ED   05/08/2017 1516 05/10/2017 1808 Full Code [id number]  Brenton Grills, PA-C ED   06/16/2015 1305 06/19/2015 1646 Full Code [id number]  [place], Silver Huguenin, MD ED   04/30/2015 2256 05/02/2015 1852 Full Code [id number]  Kinnie Feil, MD Inpatient   04/22/2015 1504 04/23/2015 1457 Full Code [id number]  Constance Haw, MD Inpatient   11/04/2014 0853 11/04/2014 1454 Full Code [id number]  Leonie Man, MD Inpatient   08/23/2014 0117 08/26/2014 1646 Full Code [id number]  Rise Patience, MD Inpatient   Advance Care Planning Activity        IV Access:    Peripheral IV   Procedures and diagnostic studies:   DG Chest Port 1 View  Result Date: 09/18/2019 CLINICAL DATA:  Cough. EXAM: PORTABLE CHEST 1 VIEW COMPARISON:  Included lung bases from abdominal CT 09/16/2019. Chest radiograph 09/11/2017, chest CT 06/26/2018 FINDINGS: Multi lead left-sided pacemaker remains in place. Post median sternotomy. Upper normal heart size. Unchanged mediastinal contours. Aortic atherosclerosis. Hazy bilateral lung base opacities likely represent pleural effusions and adjacent atelectasis. No pulmonary edema. No pneumothorax. IMPRESSION: Hazy bilateral lung base opacities likely represent pleural effusions and adjacent atelectasis. Aortic Atherosclerosis (ICD10-I70.0). Electronically Signed   By: Keith Rake M.D.   On: 09/18/2019 20:59     Medical Consultants:    None.  Anti-Infectives:   Cefepime and Flagyl.  Subjective:    [name] denies any new complaints. Awaiting SNF placement  Objective:    Vitals:   09/19/19 1627 09/19/19 1934 09/20/19 0310 09/20/19 1133  BP: 137/61 119/64 (!) 149/93 121/61  Pulse: 60 62 64 62  Resp:  _0 Temp: 98 F (36.7 C) (!) 97.5 F (36.4 C) 97.7 F (36.5 C) 98.3 F (36.8 C)  TempSrc:  Oral Oral Oral  SpO2: 98% 96% 98% 91%  Weight:   90.8 kg   Height:       SpO2: 91 % O2 Flow Rate (L/min): 0 L/min   Intake/Output Summary (Last 24 hours) at 09/20/2019 1537 Last data filed at 09/20/2019 1300 Gross per 24 hour  Intake 940 ml  Output 2475 ml  Net -1535 ml   Filed Weights   09/18/19 0111 09/19/19 0053 09/20/19 0310  Weight: 91.6 kg 91.9 kg 90.8 kg    Exam:  General: NAD   Cardiovascular: S1, S2 present  Respiratory: CTAB  Abdomen: Soft, nontender, nondistended, bowel sounds present  Musculoskeletal: 1+ bilateral  pedal edema noted  Skin: Normal  Psychiatry: Normal mood    Data Reviewed:    Labs: Basic Metabolic Panel: Recent Labs  Lab 09/14/19 0537 09/15/19 0647 09/16/19 0049 09/16/19 0049 09/17/19 0539 09/17/19 0539 09/17/19 0737 09/18/19 0658 09/18/19 0658 09/19/19 0647 09/20/19 0909  NA 134*   < > 134*  --  134*  --   --  133*  --  133* 133*  K 4.3   < > 3.3*   < > 3.8   < >  --  3.8   < > 4.4 4.1  CL 106   < > 105  --  104  --   --  104  --  104 103  CO2 19*   < > 19*  --  21*  --   --  23  --  20* 18*  GLUCOSE 124*   < > 194*  --  124*  --   --  136*  --  138* 171*  BUN 21   < > 20  --  18  --   --  21  --  27* 28*  CREATININE 1.58*   < > 1.76*  --  1.89*  --   --  1.86*  --  1.92* 1.86*  CALCIUM 7.7*   < > 7.5*  --  8.0*  --   --  8.1*  --  8.1* 8.1*  MG 2.0  --   --   --   --   --  1.8 2.2  --  2.0 1.8  PHOS 2.4*  --   --   --   --   --   --   --   --   --   --    < > = values in this interval not displayed.   GFR Estimated Creatinine Clearance: 32.4 mL/min (A) (by C-G formula based on SCr of 1.86 mg/dL (H)). Liver Function Tests: No results for input(s): AST, ALT, ALKPHOS, BILITOT, PROT, ALBUMIN in the last 168 hours. No results for input(s): LIPASE, AMYLASE in the last 168 hours. No results for input(s): AMMONIA in the last 168 hours. Coagulation profile No results for input(s): INR, PROTIME in the last 168 hours. COVID-19 Labs  No results for input(s): DDIMER, FERRITIN, LDH, CRP in the last 72 hours.  Lab Results  Component Value Date   SARSCOV2NAA NEGATIVE 09/17/2019   [place] NEGATIVE 09/02/2019   SARSCOV2NAA NOT DETECTED 11/29/2018    CBC: Recent Labs  Lab 09/14/19 0537 09/15/19 0647 09/16/19 0049 09/17/19 0539  WBC 11.1* 10.9* 10.4 10.6*  HGB 11.1* 11.0* 10.2* 10.9*  HCT 34.3* 34.2* 31.5* 34.7*  MCV 91.7 92.2 91.8 92.8  PLT 398 434* 410* 468*   Cardiac Enzymes: No results for input(s): CKTOTAL, CKMB, CKMBINDEX, TROPONINI in the last 168  hours. BNP (last 3 results) No results for input(s): PROBNP in the last 8760 hours. CBG: No results for input(s): GLUCAP in the last 168 hours. D-Dimer: No results for input(s): DDIMER in the last 72 hours. Hgb A1c: No results for input(s): HGBA1C in the last 72 hours. Lipid Profile: No results for input(s): CHOL, HDL, LDLCALC, TRIG, CHOLHDL, LDLDIRECT in the last 72 hours. Thyroid function studies: No results for input(s): TSH, T4TOTAL, T3FREE, THYROIDAB in the last 72 hours.  Invalid input(s): FREET3 Anemia work up: No results for input(s): VITAMINB12, FOLATE, FERRITIN, TIBC, IRON, RETICCTPCT in the last 72 hours. Sepsis Labs: Recent Labs  Lab 09/14/19 0537 09/15/19 0647 09/16/19 0049 09/17/19 0539  WBC 11.1* 10.9* 10.4 10.6*   Microbiology Recent Results (from the past 240 hour(s))  SARS CORONAVIRUS 2 (TAT 6-24 HRS) Nasopharyngeal Nasopharyngeal Swab     Status: None   Collection Time: 09/17/19  4:50 AM   Specimen: Nasopharyngeal Swab  Result Value Ref Range Status   SARS Coronavirus 2 NEGATIVE NEGATIVE Final    Comment: (NOTE) SARS-CoV-2 target nucleic acids are NOT DETECTED.  The SARS-CoV-2 RNA is generally detectable in upper and lower respiratory specimens during the acute phase of infection. Negative results do not preclude SARS-CoV-2 infection, do not rule out co-infections with other pathogens, and should not be used as the sole basis for treatment or other patient management decisions. Negative results must be combined with clinical observations, patient history, and epidemiological information. The expected result is Negative.  Fact Sheet for Patients: SugarRoll.be  Fact Sheet for Healthcare Providers: https://www.woods-mathews.com/  This test is not yet approved or cleared by  the [employer] and  has been authorized for detection and/or diagnosis of SARS-CoV-2 by FDA under an Emergency Use Authorization  (EUA). This EUA will remain  in effect (meaning this test can be used) for the duration of the COVID-19 declaration under Se ction 564(b)(1) of the Act, 21 U.S.C. section 360bbb-3(b)(1), unless the authorization is terminated or revoked sooner.  Performed at [facility], [place] [address], [place], [place] [address]      Medications:    apixaban  2.5 mg Oral BID   atorvastatin  20 mg Oral QPM   carvedilol  25 mg Oral BID WC   docusate sodium  100 mg Oral BID   feeding supplement  1 Container Oral TID BM   furosemide  40 mg Oral Daily   isosorbide mononitrate  30 mg Oral QPM   mexiletine  300 mg Oral Q8H   multivitamin with minerals  1 tablet Oral Daily   omega-3 acid ethyl esters  1 g Oral BID   pantoprazole  40 mg Oral Daily   saccharomyces boulardii  250 mg Oral BID   sodium chloride flush  3 mL Intravenous Q12H   sotalol  80 mg Oral BID   Continuous Infusions:  ceFEPime (MAXIPIME) IV 2 g (09/20/19 0947)   metronidazole 500 mg (09/20/19 1408)      LOS: 18 days   Alma Friendly  [facility]  09/20/2019, 3:37 PM

## 2019-09-20 NOTE — TOC Progression Note (Signed)
Transition of Care [facility]) - Progression Note    Patient Details  Name: [name] MRN: [id number] Date of Birth: October 09, 1932  Transition of Care [facility]) CM/SW [place], [place] Phone Number: [phone] 09/20/2019, 12:44 PM  Clinical Narrative:    CSW followed up with [facility] and still unable to inititate an authorization. CSW spoke with Juliann Pulse at [facility] and she stated that they would start it on Monday.  TOC team will continue to assist with discharge planning needs.[email or web address] to o   Expected Discharge Plan: Skilled Nursing Facility Barriers to Discharge: Continued Medical Work up, Ship broker  Expected Discharge Plan and Services Expected Discharge Plan: [place]   Discharge Planning Services: CM Consult Post Acute Care Choice: [place] arrangements for the past 2 months: Single Family Home                                       Social Determinants of Health (SDOH) Interventions    Readmission Risk Interventions Readmission Risk Prevention Plan 09/15/2019 12/01/2018  Transportation Screening Complete Complete  PCP or Specialist Appt within 5-7 Days - Complete  Home Care Screening - Complete  Medication Review (RN CM) - Complete  HRI or Home Care Consult Complete -  Social Work Consult for [place] Planning/Counseling Complete -  Palliative Care Screening Not Applicable -  Medication Review Press photographer) Complete -  Some recent data might be hidden

## 2019-09-21 LAB — BASIC METABOLIC PANEL
Anion gap: 9 (ref 5–15)
BUN: 33 mg/dL — ABNORMAL HIGH (ref 8–23)
CO2: 22 mmol/L (ref 22–32)
Calcium: 8.2 mg/dL — ABNORMAL LOW (ref 8.9–10.3)
Chloride: 103 mmol/L (ref 98–111)
Creatinine, Ser: 2.11 mg/dL — ABNORMAL HIGH (ref 0.61–1.24)
GFR calc Af Amer: 32 mL/min — ABNORMAL LOW (ref >60)
GFR calc non Af Amer: 28 mL/min — ABNORMAL LOW (ref >60)
Glucose, Bld: 135 mg/dL — ABNORMAL HIGH (ref 70–99)
Potassium: 4.1 mmol/L (ref 3.5–5.1)
Sodium: 134 mmol/L — ABNORMAL LOW (ref 135–145)

## 2019-09-21 LAB — MAGNESIUM: Magnesium: 2 mg/dL (ref 1.7–2.4)

## 2019-09-21 MED ORDER — SODIUM CHLORIDE 0.9 % IV SOLN
2.0000 g | INTRAVENOUS | Status: AC
  Administered 2019-09-22: 2 g via INTRAVENOUS
  Filled 2019-09-21: qty 2

## 2019-09-21 NOTE — Progress Notes (Signed)
[facility] PROGRESS NOTE    Progress Note  [name]  PJA:[id number] DOB: 1932/08/02 DOA: 09/01/2019 PCP: [name], MD     Brief Narrative:   [name] is an 86 y.o. male admitted to the hospital with acute diverticulitis and nonsustained V. tach and ileus, has been managed conservatively with NG tube placement. X-ray was obtained that showed dilated loops.  Assessment/Plan:   Acute severe diverticulitis/ileus Currently afebrile, with resolving leukocytosis Tolerating diet, having BMs Repeat CT abdomen/pelvis on 09/16/19 shows resolving diverticulitis, no abscess Continue IV cefepime and Flagyl General surgery consulted, plan for outpt follow up, d/c on oral AB x 14 days  NSVT/bradycardia/Qtc prolongation Noted to have about 25 beats of NSVT on 6/9 EKG done with QTc prolongation  EP re-consulted for further management, rec to continue sotalol and mexiletine, ICD in place. Has close follow up  Of note, pt has be getting mexiletine BID instead of TID (daughter and patient insisting it must be changed back to TID, no indication that it was ever decreased upon chart review and speaking to pharmacy)  Hypokalemia Replace prn  Chronic combined systolic and diastolic heart failure Continue daily standing weights, strict I's and O's, restrict his fluids to 1500 cc resume his home dose of Lasix. Lower extremity Dopplers negative for DVT.  Chronic hyponatremia and metabolic acidosis Daily BMP  Chronic kidney disease stage IV Creatinine remained at baseline, although noted to be rising slowly With a baseline creatinine of 1.9-2.3 Daily BMP  Anemia of chronic kidney disease Hemoglobin at baseline Daily CBC  Chronic atrial fibrillation HR controlled Continue Coreg, Eliquis Chads Vasc score greater than 2  Urinary retention Appears to be resolved  New orthostatic hypotension Resolved D/C home off hydralazine.  Severe protein caloric  malnutrition His prealbumin is low, getting boost supplements.   Continue Ensure daily     DVT prophylaxis: IV heparin Family Communication: Discussed extensively with daughter on 09/20/19 Status is: Inpatient  Remains inpatient appropriate because:Hemodynamically unstable and IV treatments appropriate due to intensity of illness or inability to take PO   Dispo: The patient is from: Home              Anticipated d/c is to: SNF              Anticipated d/c date is: 1 day              Patient currently is medically stable to d/c.     Code Status:     Code Status Orders  (From admission, onward)         Start     Ordered   09/02/19 1119  Do not attempt resuscitation (DNR)  Continuous    Question Answer Comment  In the event of cardiac or respiratory ARREST Do not call a "code blue"   In the event of cardiac or respiratory ARREST Do not perform Intubation, CPR, defibrillation or ACLS   In the event of cardiac or respiratory ARREST Use medication by any route, position, wound care, and other measures to relive pain and suffering. May use oxygen, suction and manual treatment of airway obstruction as needed for comfort.      09/02/19 1121        Code Status History    Date Active Date Inactive Code Status Order ID Comments User Context   11/29/2018 1455 12/01/2018 2053 Full Code [id number]  Albertine Patricia, MD ED   06/15/2017 2214 06/20/2017 1940 Full Code [id number]  Will,  Earnstine Regal, MD ED   05/26/2017 1733 06/04/2017 2006 Full Code [id number]  Vashti Hey, MD ED   05/08/2017 1516 05/10/2017 1808 Full Code [id number]  Brenton Grills, PA-C ED   06/16/2015 1305 06/19/2015 1646 Full Code [id number]  Elgergawy, Silver Huguenin, MD ED   04/30/2015 2256 05/02/2015 1852 Full Code [id number]  Kinnie Feil, MD Inpatient   04/22/2015 1504 04/23/2015 1457 Full Code [id number]  Constance Haw, MD Inpatient   11/04/2014 0853 11/04/2014 1454 Full Code [id number]  Leonie Man, MD Inpatient   08/23/2014 0117 08/26/2014 1646 Full Code [id number]  Rise Patience, MD Inpatient   Advance Care Planning Activity        IV Access:    Peripheral IV   Procedures and diagnostic studies:   No results found.   Medical Consultants:    None.  Anti-Infectives:   Cefepime and Flagyl.  Subjective:    [name] reports generalized weakness, with some back pain. Awaiting SNF placement  Objective:    Vitals:   09/20/19 1953 09/21/19 0044 09/21/19 0438 09/21/19 1146  BP: 130/67  (!) 150/64 120/62  Pulse: 60  (!) 59 62  Resp: _0 Temp: 97.6 F (36.4 C)  97.7 F (36.5 C) 97.8 F (36.6 C)  TempSrc: Oral  Oral Oral  SpO2: 98%  100% 98%  Weight:  90.5 kg    Height:       SpO2: 98 % O2 Flow Rate (L/min): 0 L/min   Intake/Output Summary (Last 24 hours) at 09/21/2019 1538 Last data filed at 09/21/2019 1528 Gross per 24 hour  Intake 1600 ml  Output 2150 ml  Net -550 ml   Filed Weights   09/19/19 0053 09/20/19 0310 09/21/19 0044  Weight: 91.9 kg 90.8 kg 90.5 kg    Exam:  General: NAD, deconditioned, weak  Cardiovascular: S1, S2 present  Respiratory: CTAB  Abdomen: Soft, nontender, nondistended, bowel sounds present  Musculoskeletal: Trace bilateral pedal edema noted  Skin: Normal  Psychiatry: Normal mood    Data Reviewed:    Labs: Basic Metabolic Panel: Recent Labs  Lab 09/17/19 0539 09/17/19 0539 09/17/19 0737 09/18/19 0658 09/18/19 0658 09/19/19 0647 09/19/19 0647 09/20/19 0909 09/21/19 0700  NA 134*  --   --  133*  --  133*  --  133* 134*  K 3.8   < >  --  3.8   < > 4.4   < > 4.1 4.1  CL 104  --   --  104  --  104  --  103 103  CO2 21*  --   --  23  --  20*  --  18* 22  GLUCOSE 124*  --   --  136*  --  138*  --  171* 135*  BUN 18  --   --  21  --  27*  --  28* 33*  CREATININE 1.89*  --   --  1.86*  --  1.92*  --  1.86* 2.11*  CALCIUM 8.0*  --   --  8.1*  --  8.1*  --  8.1* 8.2*  MG  --   --  1.8  2.2  --  2.0  --  1.8 2.0   < > = values in this interval not displayed.   GFR Estimated Creatinine Clearance: 28.6 mL/min (A) (by C-G formula based on SCr of 2.11 mg/dL (H)). Liver Function Tests: No results for input(s): AST, ALT,  ALKPHOS, BILITOT, PROT, ALBUMIN in the last 168 hours. No results for input(s): LIPASE, AMYLASE in the last 168 hours. No results for input(s): AMMONIA in the last 168 hours. Coagulation profile No results for input(s): INR, PROTIME in the last 168 hours. COVID-19 Labs  No results for input(s): DDIMER, FERRITIN, LDH, CRP in the last 72 hours.  Lab Results  Component Value Date   SARSCOV2NAA NEGATIVE 09/17/2019   [place] NEGATIVE 09/02/2019   SARSCOV2NAA NOT DETECTED 11/29/2018    CBC: Recent Labs  Lab 09/15/19 0647 09/16/19 0049 09/17/19 0539  WBC 10.9* 10.4 10.6*  HGB 11.0* 10.2* 10.9*  HCT 34.2* 31.5* 34.7*  MCV 92.2 91.8 92.8  PLT 434* 410* 468*   Cardiac Enzymes: No results for input(s): CKTOTAL, CKMB, CKMBINDEX, TROPONINI in the last 168 hours. BNP (last 3 results) No results for input(s): PROBNP in the last 8760 hours. CBG: No results for input(s): GLUCAP in the last 168 hours. D-Dimer: No results for input(s): DDIMER in the last 72 hours. Hgb A1c: No results for input(s): HGBA1C in the last 72 hours. Lipid Profile: No results for input(s): CHOL, HDL, LDLCALC, TRIG, CHOLHDL, LDLDIRECT in the last 72 hours. Thyroid function studies: No results for input(s): TSH, T4TOTAL, T3FREE, THYROIDAB in the last 72 hours.  Invalid input(s): FREET3 Anemia work up: No results for input(s): VITAMINB12, FOLATE, FERRITIN, TIBC, IRON, RETICCTPCT in the last 72 hours. Sepsis Labs: Recent Labs  Lab 09/15/19 0647 09/16/19 0049 09/17/19 0539  WBC 10.9* 10.4 10.6*   Microbiology Recent Results (from the past 240 hour(s))  SARS CORONAVIRUS 2 (TAT 6-24 HRS) Nasopharyngeal Nasopharyngeal Swab     Status: None   Collection Time: 09/17/19  4:50  AM   Specimen: Nasopharyngeal Swab  Result Value Ref Range Status   SARS Coronavirus 2 NEGATIVE NEGATIVE Final    Comment: (NOTE) SARS-CoV-2 target nucleic acids are NOT DETECTED.  The SARS-CoV-2 RNA is generally detectable in upper and lower respiratory specimens during the acute phase of infection. Negative results do not preclude SARS-CoV-2 infection, do not rule out co-infections with other pathogens, and should not be used as the sole basis for treatment or other patient management decisions. Negative results must be combined with clinical observations, patient history, and epidemiological information. The expected result is Negative.  Fact Sheet for Patients: SugarRoll.be  Fact Sheet for Healthcare Providers: https://www.woods-mathews.com/  This test is not yet approved or cleared by the [place] FDA and  has been authorized for detection and/or diagnosis of SARS-CoV-2 by FDA under an Emergency Use Authorization (EUA). This EUA will remain  in effect (meaning this test can be used) for the duration of the COVID-19 declaration under Se ction 564(b)(1) of the Act, 21 U.S.C. section 360bbb-3(b)(1), unless the authorization is terminated or revoked sooner.  Performed at [facility], [place] [address] [place], [place] [address]      Medications:   . apixaban  2.5 mg Oral BID  . atorvastatin  20 mg Oral QPM  . carvedilol  25 mg Oral BID WC  . docusate sodium  100 mg Oral BID  . feeding supplement  1 Container Oral TID BM  . furosemide  40 mg Oral Daily  . isosorbide mononitrate  30 mg Oral QPM  . mexiletine  300 mg Oral Q8H  . multivitamin with minerals  1 tablet Oral Daily  . omega-3 acid ethyl esters  1 g Oral BID  . pantoprazole  40 mg Oral Daily  . saccharomyces boulardii  250 mg Oral BID  . sodium bicarbonate  650 mg Oral BID  . sotalol  80 mg Oral BID   Continuous Infusions: . [START ON 09/22/2019] ceFEPime  (MAXIPIME) IV    . metronidazole 500 mg (09/21/19 1403)      LOS: 19 days   Alma Friendly  [facility]  09/21/2019, 3:38 PM

## 2019-09-21 NOTE — Plan of Care (Signed)
  Problem: Activity: Goal: Risk for activity intolerance will decrease Outcome: Progressing   Problem: Safety: Goal: Ability to remain free from injury will improve Outcome: Progressing

## 2019-09-21 NOTE — Progress Notes (Signed)
Pharmacy Antibiotic Note  [name] is a 86 y.o. male admitted on 09/01/2019 with intra-abdominal infection.  Pharmacy consulted for cefepime dosing. Flagyl added 5/26.   Patient is afebrile and appears to be improving slowly; WBC 10.6. 6/4 CT abdomen with likley ileus, diverticulitis with localized sigmoid perforation.  Renal function is worsening.  Plan: Change cefepime to 2gm IV Q24H Flagyl 558m IV Q8H per MD Monitor renal fxn, clinical progress End date:  09/22/19   F/u repeat CT scan 6/15.  Planning Augmentin x 14 days after IV course complete.  Height: 6' (182.9 cm) Weight: 90.5 kg (199 lb 9.6 oz) IBW/kg (Calculated) : 77.6  Temp (24hrs), Avg:97.9 F (36.6 C), Min:97.6 F (36.4 C), Max:98.3 F (36.8 C)  Recent Labs  Lab 09/15/19 0647 09/15/19 0647 09/16/19 0049 09/16/19 0049 09/17/19 0539 09/18/19 0658 09/19/19 0647 09/20/19 0909 09/21/19 0700  WBC 10.9*  --  10.4  --  10.6*  --   --   --   --   CREATININE 1.66*   < > 1.76*   < > 1.89* 1.86* 1.92* 1.86* 2.11*   < > = values in this interval not displayed.    Estimated Creatinine Clearance: 28.6 mL/min (A) (by C-G formula based on SCr of 2.11 mg/dL (H)).    Allergies  Allergen Reactions  . Novocain [Procaine] Swelling    SWELLING REACTION UNSPECIFIED   . Amiodarone Nausea And Vomiting    Cefepime 5/26 >> (6/15) Flagyl 5/26 >> (6/15)  5/26 BCx - 1/2 diphtheroids/corynebacterium 5/26 UCx - multi species   [name] PharmD, BCPS, [place]ela [place]6/14/2021, 10:20 AM

## 2019-09-21 NOTE — TOC Progression Note (Signed)
Transition of Care [facility]) - Progression Note    Patient Details  Name: [name] MRN: [id number] Date of Birth: 1932-10-04  Transition of Care [facility]) CM/SW [place], [place] Phone Number: 09/21/2019, 3:01 PM  Clinical Narrative:    CSW spoke with Juliann Pulse of [facility] and confirmed she initiated insurance authorization.  CSW spoke with patient's daughter [name] and updated her that we are still waiting on insurance authorization. CSW will continue to follow.   Expected Discharge Plan: Skilled Nursing Facility Barriers to Discharge: Continued Medical Work up, Ship broker  Expected Discharge Plan and Services Expected Discharge Plan: [place]   Discharge Planning Services: CM Consult Post Acute Care Choice: [place] arrangements for the past 2 months: Single Family Home                                       Social Determinants of Health (SDOH) Interventions    Readmission Risk Interventions Readmission Risk Prevention Plan 09/15/2019 12/01/2018  Transportation Screening Complete Complete  PCP or Specialist Appt within 5-7 Days - Complete  Home Care Screening - Complete  Medication Review (RN CM) - Complete  HRI or Home Care Consult Complete -  Social Work Consult for [place] Planning/Counseling Complete -  Palliative Care Screening Not Applicable -  Medication Review Press photographer) Complete -  Some recent data might be hidden

## 2019-09-22 ENCOUNTER — Inpatient Hospital Stay (HOSPITAL_COMMUNITY): Payer: Medicare HMO

## 2019-09-22 ENCOUNTER — Encounter: Payer: Medicare HMO | Admitting: Cardiology

## 2019-09-22 DIAGNOSIS — R251 Tremor, unspecified: Secondary | ICD-10-CM

## 2019-09-22 LAB — BASIC METABOLIC PANEL
Anion gap: 8 (ref 5–15)
BUN: 35 mg/dL — ABNORMAL HIGH (ref 8–23)
CO2: 20 mmol/L — ABNORMAL LOW (ref 22–32)
Calcium: 8 mg/dL — ABNORMAL LOW (ref 8.9–10.3)
Chloride: 104 mmol/L (ref 98–111)
Creatinine, Ser: 2.17 mg/dL — ABNORMAL HIGH (ref 0.61–1.24)
GFR calc Af Amer: 31 mL/min — ABNORMAL LOW (ref >60)
GFR calc non Af Amer: 27 mL/min — ABNORMAL LOW (ref >60)
Glucose, Bld: 180 mg/dL — ABNORMAL HIGH (ref 70–99)
Potassium: 3.9 mmol/L (ref 3.5–5.1)
Sodium: 132 mmol/L — ABNORMAL LOW (ref 135–145)

## 2019-09-22 MED ORDER — MEXILETINE HCL 150 MG PO CAPS
300.0000 mg | ORAL_CAPSULE | Freq: Two times a day (BID) | ORAL | Status: DC
  Administered 2019-09-22 – 2019-09-23 (×2): 300 mg via ORAL
  Filled 2019-09-22 (×3): qty 2

## 2019-09-22 MED ORDER — SODIUM BICARBONATE 650 MG PO TABS
650.0000 mg | ORAL_TABLET | Freq: Two times a day (BID) | ORAL | Status: DC
  Administered 2019-09-22 – 2019-09-23 (×3): 650 mg via ORAL
  Filled 2019-09-22 (×3): qty 1

## 2019-09-22 MED ORDER — AMOXICILLIN-POT CLAVULANATE 500-125 MG PO TABS
1.0000 | ORAL_TABLET | Freq: Two times a day (BID) | ORAL | Status: DC
  Administered 2019-09-23: 500 mg via ORAL
  Filled 2019-09-22 (×2): qty 1

## 2019-09-22 NOTE — Plan of Care (Signed)
  Problem: Elimination: Goal: Will not experience complications related to bowel motility Outcome: Progressing   Problem: Safety: Goal: Ability to remain free from injury will improve Outcome: Progressing

## 2019-09-22 NOTE — Progress Notes (Signed)
Physical Therapy Treatment Patient Details Name: [name] MRN: [id number] DOB: 06-28-1932 Today's Date: 09/22/2019    History of Present Illness 86 yo male presented to ED 09/02/19 with onset of generalized weakness after a flare of his diverticulitis with metabolic acidosis, acute urinary retention.  No perf on CT, severe diverticulitis on non-contrast CT 09/02/19. Pt currently being monitored for recurring Vtach that started 5/29. PMHx: TAVR, a-fib, CKD4, CHF, AICD, HTN, CAD with CABG, diverticulitis.    PT Comments    Pt admitted with above diagnosis. Pt with new onset of tremors that significantly affect pts functional mobility. Alerted Dr. Horris Latino via secure chat.  Per pt, tremors began 6/13.  Pt O2 sat 98% on RA with HR 60.  Supine BP was 136/72, sitting BP 111/72 with HR 80 bpm.  After pt in chair 124/76 with HR 90 bpm.  Pt tolerated a transfer to chair using Stedy with mod assist of 2 for mobility.   Pt currently with functional limitations due to balance and endurance deficits.  Feel that Rehab should be reconsulted as pt has had multiple medical issues. Pt will benefit from skilled PT to increase their independence and safety with mobility to allow discharge to the venue listed below.     Follow Up Recommendations  CIR;Supervision/Assistance - 24 hour     Equipment Recommendations  Other (comment) (TBA)    Recommendations for Other Services Rehab consult     Precautions / Restrictions Precautions Precautions: Fall Precaution Comments: fall risk,  Restrictions Weight Bearing Restrictions: No    Mobility  Bed Mobility Overal bed mobility: Modified Independent Bed Mobility: Supine to Sit     Supine to sit: Mod assist;+2 for physical assistance;HOB elevated     General bed mobility comments: uses bedrails to get OOB, incr tremors and pt stating he cant move therefore needing incr prodding and assist for initiation of movement.   Transfers Overall transfer level:  Needs assistance   Transfers: Sit to/from Stand Sit to Stand: Min assist;Mod assist;+2 safety/equipment;From elevated surface         General transfer comment: Pt so shaky today that PT obtained Stedy to have pt stand as his tremors are significantly affecting his stability.  He said the tremors started on Sunday, 6/13.  Sent a secure chat to Dr. Horris Latino regarding the trrmors and how they are affecting pt.   Ambulation/Gait                 Stairs             Wheelchair Mobility    Modified Rankin (Stroke Patients Only)       Balance Overall balance assessment: Needs assistance Sitting-balance support: Feet supported;Bilateral upper extremity supported Sitting balance-Leahy Scale: Fair Sitting balance - Comments: Needed some UE support at times due to tremors. Took incr time for pt to obtain balance initially.    Standing balance support: Bilateral upper extremity supported;During functional activity Standing balance-Leahy Scale: Poor Standing balance comment: Pt relies on UE support and needed the support of the STedy due to tremors.                             Cognition Arousal/Alertness: Awake/alert Behavior During Therapy: WFL for tasks assessed/performed Overall Cognitive Status: Within Functional Limits for tasks assessed  Exercises Total Joint Exercises Long Arc Quad: AROM;Both;Seated;10 reps (took incr time for pt to initiate movement) General Exercises - Lower Extremity Hip Flexion/Marching:  (Tried to get pt to march in place but he couldnt pick up fee)    General Comments        Pertinent Vitals/Pain      Home Living                      Prior Function            PT Goals (current goals can now be found in the care plan section) Acute Rehab PT Goals Patient Stated Goal: to get back to my garden Progress towards PT goals: Not progressing toward goals - comment  (new onset of tremors)    Frequency    Min 3X/week      PT Plan Discharge plan needs to be updated    Co-evaluation PT/OT/SLP Co-Evaluation/Treatment: Yes Reason for Co-Treatment: Complexity of the patient's impairments (multi-system involvement);For patient/therapist safety PT goals addressed during session: Mobility/safety with mobility        AM-PAC PT "6 Clicks" Mobility   Outcome Measure  Help needed turning from your back to your side while in a flat bed without using bedrails?: A Lot Help needed moving from lying on your back to sitting on the side of a flat bed without using bedrails?: A Lot Help needed moving to and from a bed to a chair (including a wheelchair)?: A Lot Help needed standing up from a chair using your arms (e.g., wheelchair or bedside chair)?: A Lot Help needed to walk in hospital room?: Total Help needed climbing 3-5 steps with a railing? : Total 6 Click Score: 10    End of Session Equipment Utilized During Treatment: Gait belt Activity Tolerance: Patient limited by fatigue (limited by new onset of tremors) Patient left: in chair;with call bell/phone within reach;with chair alarm set Nurse Communication: Mobility status (new onset of tremors since Sunday) PT Visit Diagnosis: Unsteadiness on feet (R26.81);Muscle weakness (generalized) (M62.81);Difficulty in walking, not elsewhere classified (R26.2);Ataxic gait (R26.0);Pain Pain - Right/Left:  (abdomen) Pain - part of body:  (abdomen)     Time: 3546-5681 PT Time Calculation (min) (ACUTE ONLY): 29 min  Charges:  $Therapeutic Activity: 8-22 mins                     Anastasia Tompson W,PT Acute Rehabilitation Services Pager:  [phone]  Office:  [place] 09/22/2019, 11:49 AM

## 2019-09-22 NOTE — TOC Progression Note (Signed)
Transition of Care [facility]) - Progression Note    Patient Details  Name: CORNELIS KLUVER MRN: [id number] Date of Birth: 1932-12-15  Transition of Care [facility]) CM/SW [place], [place] Phone Number: 09/22/2019, 1:03 PM  Clinical Narrative:     CSW spoke with [facility] and [facility] let CSW know that insurance authorization for patient has been approved.   Patient has bed at [facility] when medically ready for discharge. Insurance authorization has been approved.   Expected Discharge Plan: Skilled Nursing Facility Barriers to Discharge: Continued Medical Work up, Ship broker  Expected Discharge Plan and Services Expected Discharge Plan: [place]   Discharge Planning Services: CM Consult Post Acute Care Choice: [place] arrangements for the past 2 months: Single Family Home                                       Social Determinants of Health (SDOH) Interventions    Readmission Risk Interventions Readmission Risk Prevention Plan 09/15/2019 12/01/2018  Transportation Screening Complete Complete  PCP or Specialist Appt within 5-7 Days - Complete  Home Care Screening - Complete  Medication Review (RN CM) - Complete  HRI or Home Care Consult Complete -  Social Work Consult for [place] Planning/Counseling Complete -  Palliative Care Screening Not Applicable -  Medication Review Press photographer) Complete -  Some recent data might be hidden

## 2019-09-22 NOTE — Progress Notes (Signed)
Inpatient Rehabilitation-Admissions Coordinator   Was requested to re-screen this patient for CIR needs. Noted recommendations changed from SNF back to CIR based on increased functional assistance needed and new onset of tremors. Unfortunately, without a concrete reason for this decline (ie. Acute stroke, seizures, etc), it is unlikely this patient will get approved for CIR by their insurance given current working diagnosis. Pt already has a SNF auth and bed confirmed per TOC note. Would recommend continuing SNF placement if pt agreeable.   Raechel Ache, OTR/L  Rehab Admissions Coordinator  [phone] 09/22/2019 3:12 PM

## 2019-09-22 NOTE — Progress Notes (Signed)
Occupational Therapy Treatment Patient Details Name: [name] MRN: [id number] DOB: 11-10-1932 Today's Date: 09/22/2019    History of present illness 86 yo male presented to ED 09/02/19 with onset of generalized weakness after a flare of his diverticulitis with metabolic acidosis, acute urinary retention.  No perf on CT, severe diverticulitis on non-contrast CT 09/02/19. Pt currently being monitored for recurring Vtach that started 5/29. PMHx: TAVR, a-fib, CKD4, CHF, AICD, HTN, CAD with CABG, diverticulitis.   OT comments  Patient continues to make progress towards goals in skilled OT session. Patient's session encompassed co-treat with PT in session as pt has now developed significant tremors since 6/12. Pt required increased assist for bed mobility and transfers (use of stedy) due to tremors with any movement in all four extremities that greatly limited function. Pt was able to tap toes at EOB, however after a few repetitions, tremors would engage. Suspect medication to be root cause and PT notified MD after session. At this juncture, due to increased medical complications and need, therapist continues to recommend CIR to regain prior level once tremors are sorted. Will continue to follow acutely.    Follow Up Recommendations  CIR    Equipment Recommendations  Other (comment) (TBD)    Recommendations for Other Services      Precautions / Restrictions Precautions Precautions: Fall Precaution Comments: fall risk,  Restrictions Weight Bearing Restrictions: No       Mobility Bed Mobility Overal bed mobility: Modified Independent Bed Mobility: Supine to Sit     Supine to sit: Mod assist;+2 for physical assistance;HOB elevated     General bed mobility comments: uses bedrails to get OOB, incr tremors and pt stating he cant move therefore needing incr prodding and assist for initiation of movement.   Transfers Overall transfer level: Needs assistance   Transfers: Sit to/from  Stand Sit to Stand: Min assist;Mod assist;+2 safety/equipment;From elevated surface         General transfer comment: Pt so shaky today that PT obtained Stedy to have pt stand as his tremors are significantly affecting his stability.  He said the tremors started on Sunday, 6/13.  Sent a secure chat to Dr. Horris Latino regarding the trrmors and how they are affecting pt.     Balance Overall balance assessment: Needs assistance Sitting-balance support: Feet supported;Bilateral upper extremity supported Sitting balance-Leahy Scale: Fair Sitting balance - Comments: Needed some UE support at times due to tremors. Took incr time for pt to obtain balance initially.    Standing balance support: Bilateral upper extremity supported;During functional activity Standing balance-Leahy Scale: Poor Standing balance comment: Pt relies on UE support and needed the support of the STedy due to tremors.                            ADL either performed or assessed with clinical judgement   ADL Overall ADL's : Needs assistance/impaired Eating/Feeding: Moderate assistance Eating/Feeding Details (indicate cue type and reason): Due to tremors, demonstrates significant difficulty holding hand statically                 Lower Body Dressing: Total assistance Lower Body Dressing Details (indicate cue type and reason): Unable to sequence to complete due to tremors, significant delay noted in motor movement and planning             Functional mobility during ADLs: Maximal assistance;+2 for physical assistance;+2 for safety/equipment General ADL Comments: Due to tremors, pt requiring use  of stedy to stand, unable to take steps and march in place, and body exhibits tremors through all four extremities when engaging in any sort of movement, due to this pt is max to total A to complete ADLs     Vision       Perception     Praxis      Cognition Arousal/Alertness: Awake/alert Behavior During  Therapy: WFL for tasks assessed/performed Overall Cognitive Status: Within Functional Limits for tasks assessed                                          Exercises Total Joint Exercises Long Arc Quad: AROM;Both;Seated;10 reps (took incr time for pt to initiate movement) General Exercises - Lower Extremity Hip Flexion/Marching:  (Tried to get pt to march in place but he couldnt pick up fee)   Shoulder Instructions       General Comments      Pertinent Vitals/ Pain       Pain Assessment: No/denies pain  Home Living                                          Prior Functioning/Environment              Frequency  Min 2X/week        Progress Toward Goals  OT Goals(current goals can now be found in the care plan section)  Progress towards OT goals: Not progressing toward goals - comment (due to tremors (suspected medications) pt's performance is vastly different to previous session)  Acute Rehab OT Goals Patient Stated Goal: to get back to my garden OT Goal Formulation: With patient Time For Goal Achievement: 09/22/19 Potential to Achieve Goals: Good  Plan Discharge plan remains appropriate    Co-evaluation    PT/OT/SLP Co-Evaluation/Treatment: Yes Reason for Co-Treatment: Complexity of the patient's impairments (multi-system involvement);For patient/therapist safety PT goals addressed during session: Mobility/safety with mobility OT goals addressed during session: ADL's and self-care;Strengthening/ROM      AM-PAC OT "6 Clicks" Daily Activity     Outcome Measure   Help from another person eating meals?: A Lot Help from another person taking care of personal grooming?: A Lot Help from another person toileting, which includes using toliet, bedpan, or urinal?: A Lot Help from another person bathing (including washing, rinsing, drying)?: A Lot Help from another person to put on and taking off regular upper body clothing?: A  Lot Help from another person to put on and taking off regular lower body clothing?: A Lot 6 Click Score: 12    End of Session Equipment Utilized During Treatment: Gait belt;Other (comment) Charlaine Dalton)  OT Visit Diagnosis: Unsteadiness on feet (R26.81);Muscle weakness (generalized) (M62.81)   Activity Tolerance Treatment limited secondary to medical complications (Comment) (Significant tremors through all movement)   Patient Left with call bell/phone within reach;with chair alarm set;in chair   Nurse Communication Mobility status;Other (comment) (Significant tremors with all mobility, now using stedy to transfer)        Time: 7412-8786 OT Time Calculation (min): 29 min  Charges: OT General Charges $OT Visit: 1 Visit OT Treatments $Self Care/Home Management : 8-22 mins   Corinne Ports E. Alexiz Cothran, COTA/L Acute Rehabilitation Services [phone] [place] 09/22/2019, 12:51 PM

## 2019-09-22 NOTE — Progress Notes (Signed)
[facility] PROGRESS NOTE    Progress Note  [name]  PTW:[id number] DOB: 1932/09/15 DOA: 09/01/2019 PCP: [name], MD     Brief Narrative:   [name] is an 86 y.o. male admitted to the hospital with acute diverticulitis and nonsustained V. tach and ileus, has been managed conservatively with NG tube placement. X-ray was obtained that showed dilated loops.  Assessment/Plan:   Acute severe diverticulitis/ileus Currently afebrile, with resolving leukocytosis Tolerating diet, having BMs Repeat CT abdomen/pelvis on 09/16/19 shows resolving diverticulitis, no abscess S/P IV cefepime and Flagyl-->PO Augmentin to complete another 14 days as per Gen surg, last dose to be completed on 09/30/19 General surgery consulted, plan for outpt follow up, d/c on Augmentin  NSVT/bradycardia/Qtc prolongation Noted to have about 25 beats of NSVT on 6/9 EKG done with QTc prolongation  EP re-consulted for further management, rec to continue sotalol and mexiletine, ICD in place. Has close follow up  Of note, pt has be getting mexiletine BID instead of TID (daughter and patient insisting it must be changed back to TID, no indication that it was ever decreased upon chart review and speaking to pharmacy) Now pt with tremors since increasing dose, will scale back to BID and monitor closely  Noted tremors/myoclonic jerks on 09/22/19 No signs of uremia noted, likely 2/2 ??increased dose of mexiletine (causes tremors) CT head unremarkable EEG unremarkable If persists may consult neurology for further recs  Chronic combined systolic and diastolic heart failure Continue daily standing weights, strict I's and O's, restrict his fluids to 1500 cc Cr rising slowly, held home lasix dose (pt with also poor oral intake) Lower extremity Dopplers negative for DVT  Chronic hyponatremia and metabolic acidosis Daily BMP  Chronic kidney disease stage IV Creatinine remained at baseline, although  noted to be rising slowly With a baseline creatinine of 1.9-2.3 Daily BMP  Anemia of chronic kidney disease Hemoglobin at baseline Daily CBC  Chronic atrial fibrillation HR controlled Continue Coreg, Eliquis Chads Vasc score greater than 2  Urinary retention Appears to be resolved  New orthostatic hypotension Resolved D/C home off hydralazine.  Severe protein caloric malnutrition/poor oral intake His prealbumin is low, getting boost supplements.   Continue Ensure daily Dietician consulted     DVT prophylaxis: Eliquis Family Communication: Discussed extensively with daughter on 09/20/19 Status is: Inpatient  Remains inpatient appropriate because:Hemodynamically unstable and IV treatments appropriate due to intensity of illness or inability to take PO   Dispo: The patient is from: Home              Anticipated d/c is to: SNF              Anticipated d/c date is: 1 day              Patient currently is not medically stable to d/c.     Code Status:     Code Status Orders  (From admission, onward)         Start     Ordered   09/02/19 1119  Do not attempt resuscitation (DNR)  Continuous    Question Answer Comment  In the event of cardiac or respiratory ARREST Do not call a "code blue"   In the event of cardiac or respiratory ARREST Do not perform Intubation, CPR, defibrillation or ACLS   In the event of cardiac or respiratory ARREST Use medication by any route, position, wound care, and other measures to relive pain and suffering. May use oxygen, suction  and manual treatment of airway obstruction as needed for comfort.      09/02/19 1121        Code Status History    Date Active Date Inactive Code Status Order ID Comments User Context   11/29/2018 1455 12/01/2018 2053 Full Code [id number]  Albertine Patricia, MD ED   06/15/2017 2214 06/20/2017 1940 Full Code [id number]  Marcie Mowers, MD ED   05/26/2017 1733 06/04/2017 2006 Full Code [id number]  Vashti Hey, MD ED   05/08/2017 1516 05/10/2017 1808 Full Code [id number]  Brenton Grills, PA-C ED   06/16/2015 1305 06/19/2015 1646 Full Code [id number]  Elgergawy, Silver Huguenin, MD ED   04/30/2015 2256 05/02/2015 1852 Full Code [id number]  Kinnie Feil, MD Inpatient   04/22/2015 1504 04/23/2015 1457 Full Code [id number]  Constance Haw, MD Inpatient   11/04/2014 0853 11/04/2014 1454 Full Code [id number]  Leonie Man, MD Inpatient   08/23/2014 0117 08/26/2014 1646 Full Code [id number]  Rise Patience, MD Inpatient   Advance Care Planning Activity        IV Access:    Peripheral IV   Procedures and diagnostic studies:   CT HEAD WO CONTRAST  Result Date: 09/22/2019 CLINICAL DATA:  Ataxia EXAM: CT HEAD WITHOUT CONTRAST TECHNIQUE: Contiguous axial images were obtained from the base of the skull through the vertex without intravenous contrast. COMPARISON:  November 29, 2018 FINDINGS: Brain: Mild diffuse atrophy present, commensurate with age. There is no intracranial mass, hemorrhage, extra-axial fluid collection, or midline shift. There is mild small vessel disease in the centra semiovale bilaterally, stable. There is a prior small infarct in the superior left basal ganglia region, unchanged. There is a tiny lacunar infarct in the mid left cerebellum, stable. No acute appearing infarct evident. Vascular: No hyperdense vessel. There is calcification in each distal vertebral artery and carotid siphon region. Skull: Bony calvarium appears intact. Sinuses/Orbits: There is mucosal thickening in several ethmoid air cells. Other paranasal sinuses are clear. Orbits appear symmetric bilaterally. Other: Mastoid air cells are clear. IMPRESSION: Stable age related atrophy with mild periventricular small vessel disease. Small lacunar infarcts in the left superior basal ganglia and mid left cerebellar regions. No acute infarct. No mass or hemorrhage. There are foci of arterial vascular calcification. There  is mucosal thickening in several ethmoid air cells. Electronically Signed   By: Lowella Grip III M.D.   On: 09/22/2019 13:05   EEG adult  Result Date: 09/22/2019 Lora Havens, MD     09/22/2019  2:57 PM Patient Name: [name] MRN: [id number] Epilepsy Attending: Lora Havens Referring Physician/Provider: Dr Lesia Sago Date: 09/22/2019 Duration: 24.07 mins Patient history: 86 year old male admitted with acute diverticulitis.  Today noted to have tremor-like movements of arms and legs.  EEG eval for seizures. Level of alertness: Awake, asleep AEDs during EEG study: None Technical aspects: This EEG study was done with scalp electrodes positioned according to the 10-20 International system of electrode placement. Electrical activity was acquired at a sampling rate of _0  and reviewed with a high frequency filter of _1  and a low frequency filter of _2 . EEG data were recorded continuously and digitally stored. Description: The posterior dominant rhythm consists of 9-10 Hz alpha activity seen predominantly in posterior head regions, symmetric and reactive to eye opening and eye closing. Sleep was characterized by vertex waves, sleep spindles (12 to 14 Hz), maximal frontocentral region. Hyperventilation and photic stimulation were not performed.   IMPRESSION:  This study is within normal limits. No seizures or epileptiform discharges were seen throughout the recording. [place] [facility]:    None.  Anti-Infectives:   Cefepime and Flagyl.  Subjective:    [name] noted with new tremors, affecting mobility. Still with poor oral intake.  Objective:    Vitals:   09/21/19 1721 09/21/19 1953 09/22/19 0408 09/22/19 1305  BP: (!) 153/71 124/67 (!) 161/69 137/70  Pulse: (!) 59 61 65 66  Resp: _0 Temp: 98 F (36.7 C) 97.9 F (36.6 C) 97.9 F (36.6 C) 97.8 F (36.6 C)  TempSrc: Oral Oral Oral Oral  SpO2: 100% 99% 100% 99%  Weight:    90.8 kg   Height:       SpO2: 99 % O2 Flow Rate (L/min): 0 L/min   Intake/Output Summary (Last 24 hours) at 09/22/2019 1728 Last data filed at 09/22/2019 1503 Gross per 24 hour  Intake 1139.94 ml  Output 2075 ml  Net -935.06 ml   Filed Weights   09/20/19 0310 09/21/19 0044 09/22/19 0408  Weight: 90.8 kg 90.5 kg 90.8 kg    Exam:  General: NAD, deconditioned, weak  Cardiovascular: S1, S2 present  Respiratory: CTAB  Abdomen: Soft, nontender, nondistended, bowel sounds present  Musculoskeletal: No bilateral pedal edema noted  Skin: Normal  Psychiatry: Normal mood    Data Reviewed:    Labs: Basic Metabolic Panel: Recent Labs  Lab 09/17/19 0539 09/17/19 0737 09/18/19 0658 09/18/19 0658 09/19/19 0647 09/19/19 0647 09/20/19 0909 09/20/19 0909 09/21/19 0700 09/22/19 0755  NA   < >  --  133*  --  133*  --  133*  --  134* 132*  K   < >  --  3.8   < > 4.4   < > 4.1   < > 4.1 3.9  CL   < >  --  104  --  104  --  103  --  103 104  CO2   < >  --  23  --  20*  --  18*  --  22 20*  GLUCOSE   < >  --  136*  --  138*  --  171*  --  135* 180*  BUN   < >  --  21  --  27*  --  28*  --  33* 35*  CREATININE   < >  --  1.86*  --  1.92*  --  1.86*  --  2.11* 2.17*  CALCIUM   < >  --  8.1*  --  8.1*  --  8.1*  --  8.2* 8.0*  MG  --  1.8 2.2  --  2.0  --  1.8  --  2.0  --    < > = values in this interval not displayed.   GFR Estimated Creatinine Clearance: 27.8 mL/min (A) (by C-G formula based on SCr of 2.17 mg/dL (H)). Liver Function Tests: No results for input(s): AST, ALT, ALKPHOS, BILITOT, PROT, ALBUMIN in the last 168 hours. No results for input(s): LIPASE, AMYLASE in the last 168 hours. No results for input(s): AMMONIA in the last 168 hours. Coagulation profile No results for input(s): INR, PROTIME in the last 168 hours. COVID-19 Labs  No results for input(s): DDIMER, FERRITIN, LDH, CRP in the last 72 hours.  Lab Results  Component Value Date   SARSCOV2NAA  NEGATIVE 09/17/2019   [place] NEGATIVE 09/02/2019  SARSCOV2NAA NOT DETECTED 11/29/2018    CBC: Recent Labs  Lab 09/16/19 0049 09/17/19 0539  WBC 10.4 10.6*  HGB 10.2* 10.9*  HCT 31.5* 34.7*  MCV 91.8 92.8  PLT 410* 468*   Cardiac Enzymes: No results for input(s): CKTOTAL, CKMB, CKMBINDEX, TROPONINI in the last 168 hours. BNP (last 3 results) No results for input(s): PROBNP in the last 8760 hours. CBG: No results for input(s): GLUCAP in the last 168 hours. D-Dimer: No results for input(s): DDIMER in the last 72 hours. Hgb A1c: No results for input(s): HGBA1C in the last 72 hours. Lipid Profile: No results for input(s): CHOL, HDL, LDLCALC, TRIG, CHOLHDL, LDLDIRECT in the last 72 hours. Thyroid function studies: No results for input(s): TSH, T4TOTAL, T3FREE, THYROIDAB in the last 72 hours.  Invalid input(s): FREET3 Anemia work up: No results for input(s): VITAMINB12, FOLATE, FERRITIN, TIBC, IRON, RETICCTPCT in the last 72 hours. Sepsis Labs: Recent Labs  Lab 09/16/19 0049 09/17/19 0539  WBC 10.4 10.6*   Microbiology Recent Results (from the past 240 hour(s))  SARS CORONAVIRUS 2 (TAT 6-24 HRS) Nasopharyngeal Nasopharyngeal Swab     Status: None   Collection Time: 09/17/19  4:50 AM   Specimen: Nasopharyngeal Swab  Result Value Ref Range Status   SARS Coronavirus 2 NEGATIVE NEGATIVE Final    Comment: (NOTE) SARS-CoV-2 target nucleic acids are NOT DETECTED.  The SARS-CoV-2 RNA is generally detectable in upper and lower respiratory specimens during the acute phase of infection. Negative results do not preclude SARS-CoV-2 infection, do not rule out co-infections with other pathogens, and should not be used as the sole basis for treatment or other patient management decisions. Negative results must be combined with clinical observations, patient history, and epidemiological information. The expected result is Negative.  Fact Sheet for  Patients: SugarRoll.be  Fact Sheet for Healthcare Providers: https://www.woods-mathews.com/  This test is not yet approved or cleared by the [place] FDA and  has been authorized for detection and/or diagnosis of SARS-CoV-2 by FDA under an Emergency Use Authorization (EUA). This EUA will remain  in effect (meaning this test can be used) for the duration of the COVID-19 declaration under Se ction 564(b)(1) of the Act, 21 U.S.C. section 360bbb-3(b)(1), unless the authorization is terminated or revoked sooner.  Performed at [facility], [place] [address], [place], [place] [address]      Medications:   . apixaban  2.5 mg Oral BID  . atorvastatin  20 mg Oral QPM  . carvedilol  25 mg Oral BID WC  . docusate sodium  100 mg Oral BID  . feeding supplement  1 Container Oral TID BM  . isosorbide mononitrate  30 mg Oral QPM  . mexiletine  300 mg Oral Q12H  . multivitamin with minerals  1 tablet Oral Daily  . omega-3 acid ethyl esters  1 g Oral BID  . pantoprazole  40 mg Oral Daily  . saccharomyces boulardii  250 mg Oral BID  . sodium bicarbonate  650 mg Oral BID  . sotalol  80 mg Oral BID   Continuous Infusions: . metronidazole 500 mg (09/22/19 1503)      LOS: 20 days   Alma Friendly  [facility]  09/22/2019, 5:28 PM

## 2019-09-22 NOTE — Procedures (Signed)
Patient Name: [name]  MRN: [id number]  Epilepsy Attending: Lora Havens  Referring Physician/Provider: Dr Lesia Sago Date: 09/22/2019 Duration: 24.07 mins  Patient history: 86 year old male admitted with acute diverticulitis.  Today noted to have tremor-like movements of arms and legs.  EEG eval for seizures.  Level of alertness: Awake, asleep  AEDs during EEG study: None  Technical aspects: This EEG study was done with scalp electrodes positioned according to the 10-20 International system of electrode placement. Electrical activity was acquired at a sampling rate of _0  and reviewed with a high frequency filter of _1  and a low frequency filter of _2 . EEG data were recorded continuously and digitally stored.   Description: The posterior dominant rhythm consists of 9-10 Hz alpha activity seen predominantly in posterior head regions, symmetric and reactive to eye opening and eye closing. Sleep was characterized by vertex waves, sleep spindles (12 to 14 Hz), maximal frontocentral region. Hyperventilation and photic stimulation were not performed.     IMPRESSION: This study is within normal limits. No seizures or epileptiform discharges were seen throughout the recording.  Tyson Masin Barbra Sarks

## 2019-09-22 NOTE — Progress Notes (Signed)
EEG completed, results pending.

## 2019-09-23 DIAGNOSIS — E785 Hyperlipidemia, unspecified: Secondary | ICD-10-CM | POA: Diagnosis not present

## 2019-09-23 DIAGNOSIS — K5792 Diverticulitis of intestine, part unspecified, without perforation or abscess without bleeding: Secondary | ICD-10-CM | POA: Diagnosis not present

## 2019-09-23 DIAGNOSIS — M6281 Muscle weakness (generalized): Secondary | ICD-10-CM | POA: Diagnosis not present

## 2019-09-23 DIAGNOSIS — N184 Chronic kidney disease, stage 4 (severe): Secondary | ICD-10-CM | POA: Diagnosis not present

## 2019-09-23 DIAGNOSIS — R05 Cough: Secondary | ICD-10-CM | POA: Diagnosis not present

## 2019-09-23 DIAGNOSIS — E1122 Type 2 diabetes mellitus with diabetic chronic kidney disease: Secondary | ICD-10-CM | POA: Diagnosis not present

## 2019-09-23 DIAGNOSIS — M25551 Pain in right hip: Secondary | ICD-10-CM | POA: Diagnosis not present

## 2019-09-23 DIAGNOSIS — Z79899 Other long term (current) drug therapy: Secondary | ICD-10-CM | POA: Diagnosis not present

## 2019-09-23 DIAGNOSIS — R5381 Other malaise: Secondary | ICD-10-CM | POA: Diagnosis not present

## 2019-09-23 DIAGNOSIS — E43 Unspecified severe protein-calorie malnutrition: Secondary | ICD-10-CM | POA: Diagnosis not present

## 2019-09-23 DIAGNOSIS — I5022 Chronic systolic (congestive) heart failure: Secondary | ICD-10-CM | POA: Diagnosis not present

## 2019-09-23 DIAGNOSIS — R0602 Shortness of breath: Secondary | ICD-10-CM | POA: Diagnosis not present

## 2019-09-23 DIAGNOSIS — I129 Hypertensive chronic kidney disease with stage 1 through stage 4 chronic kidney disease, or unspecified chronic kidney disease: Secondary | ICD-10-CM | POA: Diagnosis not present

## 2019-09-23 DIAGNOSIS — K219 Gastro-esophageal reflux disease without esophagitis: Secondary | ICD-10-CM | POA: Diagnosis not present

## 2019-09-23 DIAGNOSIS — E875 Hyperkalemia: Secondary | ICD-10-CM | POA: Diagnosis not present

## 2019-09-23 DIAGNOSIS — N1832 Chronic kidney disease, stage 3b: Secondary | ICD-10-CM | POA: Diagnosis not present

## 2019-09-23 DIAGNOSIS — M2550 Pain in unspecified joint: Secondary | ICD-10-CM | POA: Diagnosis not present

## 2019-09-23 DIAGNOSIS — I4819 Other persistent atrial fibrillation: Secondary | ICD-10-CM | POA: Diagnosis not present

## 2019-09-23 DIAGNOSIS — I10 Essential (primary) hypertension: Secondary | ICD-10-CM | POA: Diagnosis not present

## 2019-09-23 DIAGNOSIS — I2510 Atherosclerotic heart disease of native coronary artery without angina pectoris: Secondary | ICD-10-CM | POA: Diagnosis not present

## 2019-09-23 DIAGNOSIS — I472 Ventricular tachycardia: Secondary | ICD-10-CM | POA: Diagnosis not present

## 2019-09-23 DIAGNOSIS — I480 Paroxysmal atrial fibrillation: Secondary | ICD-10-CM | POA: Diagnosis not present

## 2019-09-23 DIAGNOSIS — R0989 Other specified symptoms and signs involving the circulatory and respiratory systems: Secondary | ICD-10-CM | POA: Diagnosis not present

## 2019-09-23 DIAGNOSIS — R262 Difficulty in walking, not elsewhere classified: Secondary | ICD-10-CM | POA: Diagnosis not present

## 2019-09-23 DIAGNOSIS — J189 Pneumonia, unspecified organism: Secondary | ICD-10-CM | POA: Diagnosis not present

## 2019-09-23 DIAGNOSIS — L22 Diaper dermatitis: Secondary | ICD-10-CM | POA: Diagnosis not present

## 2019-09-23 DIAGNOSIS — Z95810 Presence of automatic (implantable) cardiac defibrillator: Secondary | ICD-10-CM | POA: Diagnosis not present

## 2019-09-23 DIAGNOSIS — Z7401 Bed confinement status: Secondary | ICD-10-CM | POA: Diagnosis not present

## 2019-09-23 DIAGNOSIS — I5042 Chronic combined systolic (congestive) and diastolic (congestive) heart failure: Secondary | ICD-10-CM | POA: Diagnosis not present

## 2019-09-23 DIAGNOSIS — K2270 Barrett's esophagus without dysplasia: Secondary | ICD-10-CM | POA: Diagnosis not present

## 2019-09-23 LAB — BASIC METABOLIC PANEL
Anion gap: 9 (ref 5–15)
BUN: 35 mg/dL — ABNORMAL HIGH (ref 8–23)
CO2: 21 mmol/L — ABNORMAL LOW (ref 22–32)
Calcium: 8 mg/dL — ABNORMAL LOW (ref 8.9–10.3)
Chloride: 105 mmol/L (ref 98–111)
Creatinine, Ser: 2.09 mg/dL — ABNORMAL HIGH (ref 0.61–1.24)
GFR calc Af Amer: 33 mL/min — ABNORMAL LOW (ref >60)
GFR calc non Af Amer: 28 mL/min — ABNORMAL LOW (ref >60)
Glucose, Bld: 179 mg/dL — ABNORMAL HIGH (ref 70–99)
Potassium: 3.5 mmol/L (ref 3.5–5.1)
Sodium: 135 mmol/L (ref 135–145)

## 2019-09-23 LAB — MAGNESIUM: Magnesium: 1.8 mg/dL (ref 1.7–2.4)

## 2019-09-23 LAB — CBC WITH DIFFERENTIAL/PLATELET
Abs Immature Granulocytes: 0.08 10*3/uL — ABNORMAL HIGH (ref 0.00–0.07)
Basophils Absolute: 0.1 10*3/uL (ref 0.0–0.1)
Basophils Relative: 1 %
Eosinophils Absolute: 0.2 10*3/uL (ref 0.0–0.5)
Eosinophils Relative: 2 %
HCT: 32.4 % — ABNORMAL LOW (ref 39.0–52.0)
Hemoglobin: 10.4 g/dL — ABNORMAL LOW (ref 13.0–17.0)
Immature Granulocytes: 1 %
Lymphocytes Relative: 19 %
Lymphs Abs: 1.9 10*3/uL (ref 0.7–4.0)
MCH: 29.7 pg (ref 26.0–34.0)
MCHC: 32.1 g/dL (ref 30.0–36.0)
MCV: 92.6 fL (ref 80.0–100.0)
Monocytes Absolute: 1.8 10*3/uL — ABNORMAL HIGH (ref 0.1–1.0)
Monocytes Relative: 18 %
Neutro Abs: 5.9 10*3/uL (ref 1.7–7.7)
Neutrophils Relative %: 59 %
Platelets: 283 10*3/uL (ref 150–400)
RBC: 3.5 MIL/uL — ABNORMAL LOW (ref 4.22–5.81)
RDW: 16 % — ABNORMAL HIGH (ref 11.5–15.5)
WBC: 9.9 10*3/uL (ref 4.0–10.5)
nRBC: 0 % (ref 0.0–0.2)

## 2019-09-23 LAB — SARS CORONAVIRUS 2 (TAT 6-24 HRS): SARS Coronavirus 2: NEGATIVE

## 2019-09-23 MED ORDER — MEXILETINE HCL 150 MG PO CAPS: 300.0000 mg | ORAL_CAPSULE | Freq: Two times a day (BID) | ORAL | Status: DC

## 2019-09-23 MED ORDER — DOCUSATE SODIUM 100 MG PO CAPS: 100.0000 mg | ORAL_CAPSULE | Freq: Two times a day (BID) | ORAL | 0 refills | Status: DC

## 2019-09-23 MED ORDER — TRAMADOL HCL 50 MG PO TABS: 50.0000 mg | ORAL_TABLET | Freq: Two times a day (BID) | ORAL | 0 refills | Status: AC | PRN

## 2019-09-23 MED ORDER — SACCHAROMYCES BOULARDII 250 MG PO CAPS: 250.0000 mg | ORAL_CAPSULE | Freq: Two times a day (BID) | ORAL | Status: AC

## 2019-09-23 MED ORDER — FUROSEMIDE 40 MG PO TABS: 40.0000 mg | ORAL_TABLET | Freq: Every day | ORAL | Status: DC | PRN

## 2019-09-23 MED ORDER — BISACODYL 5 MG PO TBEC: 5.0000 mg | DELAYED_RELEASE_TABLET | Freq: Every day | ORAL | 0 refills | Status: DC | PRN

## 2019-09-23 MED ORDER — AMOXICILLIN-POT CLAVULANATE 500-125 MG PO TABS: 1.0000 | ORAL_TABLET | Freq: Two times a day (BID) | ORAL | Status: AC

## 2019-09-23 NOTE — Progress Notes (Signed)
Physical Therapy Treatment Patient Details Name: [name] MRN: [id number] DOB: Sep 01, 1932 Today's Date: 09/23/2019    History of Present Illness 86 yo male presented to ED 09/02/19 with onset of generalized weakness after a flare of his diverticulitis with metabolic acidosis, acute urinary retention.  No perf on CT, severe diverticulitis on non-contrast CT 09/02/19. Pt currently being monitored for recurring Vtach that started 5/29. PMHx: TAVR, a-fib, CKD4, CHF, AICD, HTN, CAD with CABG, diverticulitis.    PT Comments    Pt admitted with above diagnosis. Pt was able to pivot to chair with min to mod assist and cues.  Tremors better today than yesterday. Plan is for pt to d/c to SNF today for continued therapy. Since that is arranged, PT feels that this plan is good for pt and spoke with nurse and SW about updated plan.   Pt currently with functional limitations due to the deficits listed below (see PT Problem List). Pt will benefit from skilled PT to increase their independence and safety with mobility to allow discharge to the venue listed below.     Follow Up Recommendations  SNF;Supervision/Assistance - 24 hour     Equipment Recommendations  Other (comment) (TBA)    Recommendations for Other Services       Precautions / Restrictions Precautions Precautions: Fall Precaution Comments: fall risk Restrictions Weight Bearing Restrictions: No    Mobility  Bed Mobility Overal bed mobility: Modified Independent Bed Mobility: Supine to Sit     Supine to sit: Mod assist;HOB elevated;Min assist     General bed mobility comments: uses bedrails to get OOB, incr tremors and assist for initiation of movement.   Transfers Overall transfer level: Needs assistance Equipment used: Rolling walker (2 wheeled) Transfers: Sit to/from Stand Sit to Stand: Min assist;Mod assist;From elevated surface         General transfer comment: Pt less shaky today.  Was able to stand and pivot  with RW with slight tremors in LEs but overall steady with stand and pivot. Pt declined walking due to continued slight tremors with movement.   Ambulation/Gait                 Stairs             Wheelchair Mobility    Modified Rankin (Stroke Patients Only)       Balance Overall balance assessment: Needs assistance Sitting-balance support: Feet supported;Bilateral upper extremity supported Sitting balance-Leahy Scale: Fair Sitting balance - Comments: Needed some UE support at times due to tremors. Took incr time for pt to obtain balance initially.    Standing balance support: Bilateral upper extremity supported;During functional activity Standing balance-Leahy Scale: Poor Standing balance comment: Pt relies on UE support and needed the support of the RW and external support due to tremors.                             Cognition Arousal/Alertness: Awake/alert Behavior During Therapy: WFL for tasks assessed/performed Overall Cognitive Status: Within Functional Limits for tasks assessed                                        Exercises Total Joint Exercises Ankle Circles/Pumps: AROM;10 reps;Both;Seated Long Arc Quad: AROM;Both;Seated;10 reps General Exercises - Lower Extremity Hip Flexion/Marching: AROM;Both;15 reps;Seated    General Comments  Pertinent Vitals/Pain Pain Assessment: No/denies pain    Home Living                      Prior Function            PT Goals (current goals can now be found in the care plan section) Acute Rehab PT Goals Patient Stated Goal: to get back to my garden Progress towards PT goals: Progressing toward goals    Frequency    Min 3X/week      PT Plan Discharge plan needs to be updated    Co-evaluation              AM-PAC PT "6 Clicks" Mobility   Outcome Measure  Help needed turning from your back to your side while in a flat bed without using bedrails?: A  Little Help needed moving from lying on your back to sitting on the side of a flat bed without using bedrails?: A Lot Help needed moving to and from a bed to a chair (including a wheelchair)?: A Lot Help needed standing up from a chair using your arms (e.g., wheelchair or bedside chair)?: A Lot Help needed to walk in hospital room?: Total Help needed climbing 3-5 steps with a railing? : Total 6 Click Score: 11    End of Session Equipment Utilized During Treatment: Gait belt Activity Tolerance: Patient limited by fatigue Patient left: in chair;with call bell/phone within reach;with chair alarm set Nurse Communication: Mobility status PT Visit Diagnosis: Unsteadiness on feet (R26.81);Muscle weakness (generalized) (M62.81);Difficulty in walking, not elsewhere classified (R26.2);Ataxic gait (R26.0);Pain Pain - Right/Left:  (abdomen) Pain - part of body:  (abdomen)     Time: 0569-7948 PT Time Calculation (min) (ACUTE ONLY): 19 min  Charges:  $Therapeutic Activity: 8-22 mins                     Lyrica Mcclarty W,PT Acute Rehabilitation Services Pager:  [phone]  Office:  [place] 09/23/2019, 12:02 PM

## 2019-09-23 NOTE — Discharge Summary (Signed)
Physician Discharge Summary  [name] GYJ:[id number] DOB: June 22, 1932 DOA: 09/01/2019  PCP: [name], MD  Admit date: 09/01/2019 Discharge date: 09/23/2019  Admitted From: Home Discharge disposition: SNF   Code Status: DNR  Diet Recommendation: Cardiac diet   Recommendations for Outpatient Follow-Up:   1. Follow-up with general surgery as an outpatient 2. Follow-up with cardiology as an outpatient  Discharge Diagnosis:   Principal Problem:   Diverticulitis Active Problems:   Essential hypertension   Dyslipidemia   Persistent atrial fibrillation (HCC)   Stage 4 chronic kidney disease (HCC)   Chronic combined systolic (congestive) and diastolic (congestive) heart failure (HCC)   Cardiac resynchronization therapy defibrillator (CRT-D) in place   Acute urinary retention   Hyponatremia   Metabolic acidosis   Ileus (HCC)   Orthostatic hypotension    History of Present Illness / Brief narrative:  [name] a 86 y.o.malewith medical history significant ofTAVR; pre-diabetes; afib; hyperthyroidism; HTN; HLD; stage 4 CKD; chronic combined CHF; AICD placement; and CAD s/p CABG. Patient presented to ED on 09/01/2019 with complaint of abdominal pain, nausea and vomiting. He recently had E coli enteritis a couple of weeks ago which was treated with antibiotics.  He initially thought his pain was secondary to recurrence of that infection.  However with worsening pain, he presented to the ED.   Also he hadn't been able to void - last void was 1 day prior to admission.  CT scan of abdomen showed acute CVA diverticulitis without perforation or abscess. X-ray showed dilated loops. WBC count 20,000. He was admitted inpatient and started on conservative management with IV fluid, IV antibiotics, NG tube placement. He improved with conservative management. Please see detailed hospital course below.   Hospital Course:  Acute severe diverticulitis/ileus -Presented  with abdominal pain, nausea, vomiting.  Initial CT scan abdomen showed severe diverticulitis.  WBC count was elevated to 20,000.  He was started on broad-spectrum antibiotics with IV cefepime and IV Flagyl.   -General surgery consultation was obtained. -With conservative management, leukocytosis, abdominal pain improved. -Repeat CT abdomen/pelvis on 09/16/19 showed resolving diverticulitis, no abscess. -He was switched to oral Augmentin with a plan to complete the course on 09/30/2019.  -Patient to follow-up with general surgery as an outpatient.  Chronic atrial fibrillation NSVT/bradycardia/Qtc prolongation -Home meds include Coreg, mexiletine and sotalol.   -On 6/9, telemetry monitoring showed about 25 beats of nonsustained V. tach.  EKG showed QTC prolongation at 530 ms.  EP was consulted.  Recommended to continue current medications.  Patient already has an ICD in place.  -Of note, pt has been getting mexiletine BID.  However daughter and patient insisted that it should be TID. It was then switched to TID after which patient started having tremors.  Hence it has been switched back to twice daily again.  -Continue Eliquis for anticoagulation.  Myoclonic jerks/tremors -On 6/15, patient was noted to have tremor/myoclonic jerks. -Blood urea level normal.  CT head unremarkable.  EEG unremarkable. -It was determined that tremors were secondary to increased frequency of mexiletine and hence it was reduced from 3 times daily to twice daily dosing. -On my evaluation this morning, patient does not have any tremors or jerks.  Chronic combined systolic and diastolic heart failure Essential hypertension -Home meds include Coreg 25 mg twice daily, Lasix 40 mg twice daily, hydralazine 12.5 mg twice daily, Imdur 30 mg daily. -Echocardiogram from 5/31 showed EF of 30 to 35% with grade 2 diastolic dysfunction. -During this hospitalization, he  also had orthostatic fluctuation in blood pressure.  Hence Lasix  was held. -Current medicines include Coreg 25 mg twice daily, Imdur 30 mg daily.  Lasix and hydralazine are on hold. -Patient looks euvolemic on fluid restriction.  -At discharge, I would resume Lasix at 40 mg daily as needed only.  He needs to have close monitoring of blood pressure and heart failure symptoms post discharge as well. -Follow-up with cardiology as an outpatient.  Chronic kidney disease stage IV Creatinine remained at 1.9-2.3  Anemia of chronic kidney disease Hemoglobin at baseline  Severe protein caloric malnutrition/poor oral intake His prealbumin is low, getting boost supplements.   Continue Ensure daily Dietician consult appreciated.    Code Status: DNR  Stable for discharge to SNF today.   Subjective:  Seen and examined this morning.  Pleasant elderly Caucasian male.  Lying down in bed.  Not in distress.  Seems a little anxious.  No tremors today.  Discharge Exam:   Vitals:   09/22/19 0408 09/22/19 1305 09/22/19 2011 09/23/19 0413  BP: (!) 161/69 137/70 (!) 97/50 (!) 148/73  Pulse: 65 66 65 (!) 59  Resp: _0 Temp: 97.9 F (36.6 C) 97.8 F (36.6 C) 98.8 F (37.1 C) 97.6 F (36.4 C)  TempSrc: Oral Oral Oral Oral  SpO2: 100% 99% 97% 99%  Weight: 90.8 kg   89.9 kg  Height:        Body mass index is 26.89 kg/m.  General exam: Appears calm and comfortable.  Skin: No rashes, lesions or ulcers. HEENT: Atraumatic, normocephalic, supple neck, no obvious bleeding Lungs: Clear to auscultation bilaterally CVS: Regular rate and rhythm, no murmur GI/Abd soft, nontender, nondistended, bowel sound present CNS: Alert, awake, oriented to place and person Psychiatry: Slightly anxious Extremities: No pedal edema, no calf tenderness  Discharge Instructions:  Wound care: None Discharge Instructions    Diet - low sodium heart healthy   Complete by: As directed    Increase activity slowly   Complete by: As directed       Contact information for  follow-up providers    Constance Haw, MD Follow up.   Specialty: Cardiology Why: 09/22/2019 @ 11:45AM Contact information: [address] [place] [address] [phone]        Ileana Roup, MD. Go on 11/10/2019.   Specialty: General Surgery Why: Your appointment is 8/3 at 11:30am to discuss the possibility of elective colon surgery. Please arrive 30 minutes prior to your appointment to check in and fill out paperwork. Bring photo ID and insurance information. Contact information: [place] [address] [phone]        [name], MD. Schedule an appointment as soon as possible for a visit.   Specialty: Family Medicine Why: Call to arrange post-hospitalization follow up appointment Contact information: [address] [place] [address] [phone]        Sueanne Margarita, MD .   Specialty: Cardiology Contact information: [address] [place] [address] [phone]            Contact information for after-discharge care    Destination    [facility] Preferred SNF .   Service: Skilled Nursing Contact information: [address] [phone]                 Allergies as of 09/23/2019      Reactions   Novocain [  procaine] Swelling   SWELLING REACTION UNSPECIFIED    Amiodarone Nausea And Vomiting      Medication List    STOP taking these medications   acetaminophen 325 MG tablet Commonly known as: TYLENOL   albuterol 108 (90 Base) MCG/ACT inhaler Commonly known as: VENTOLIN HFA   ciprofloxacin 500 MG tablet Commonly known as: CIPRO   hydrALAZINE 25 MG tablet Commonly known as: APRESOLINE     TAKE these medications   amoxicillin-clavulanate 500-125 MG tablet Commonly known as: AUGMENTIN Take 1 tablet (500 mg total) by mouth every 12 (twelve) hours for 7 days.   atorvastatin 20 MG tablet Commonly known as:  LIPITOR TAKE 1 TABLET EVERY DAY  AT  6  PM What changed: See the new instructions.   bisacodyl 5 MG EC tablet Commonly known as: DULCOLAX Take 1 tablet (5 mg total) by mouth daily as needed for moderate constipation.   carvedilol 25 MG tablet Commonly known as: COREG TAKE 1 TABLET TWICE DAILY (DOSE INCREASE) What changed: See the new instructions.   docusate sodium 100 MG capsule Commonly known as: COLACE Take 1 capsule (100 mg total) by mouth 2 (two) times daily.   Eliquis 2.5 MG Tabs tablet Generic drug: apixaban TAKE 1 TABLET TWICE DAILY What changed: how much to take   famotidine 40 MG tablet Commonly known as: PEPCID Take 40 mg by mouth every evening.   Fish Oil 1000 MG Caps Take 2,000 mg by mouth 2 (two) times daily.   furosemide 40 MG tablet Commonly known as: LASIX Take 1 tablet (40 mg total) by mouth daily as needed for edema. What changed:   how much to take  how to take this  when to take this  reasons to take this  additional instructions   isosorbide mononitrate 30 MG 24 hr tablet Commonly known as: IMDUR TAKE 1 TABLET EVERY DAY What changed: when to take this   mexiletine 150 MG capsule Commonly known as: MEXITIL Take 2 capsules (300 mg total) by mouth every 12 (twelve) hours. What changed: when to take this   multivitamin with minerals Tabs tablet Take 1 tablet by mouth daily.   pantoprazole 40 MG tablet Commonly known as: PROTONIX Take 40 mg by mouth daily.   saccharomyces boulardii 250 MG capsule Commonly known as: FLORASTOR Take 1 capsule (250 mg total) by mouth 2 (two) times daily for 14 days.   sotalol 80 MG tablet Commonly known as: Betapace Take 0.5 tablets (40 mg total) by mouth 2 (two) times daily.   traMADol 50 MG tablet Commonly known as: ULTRAM Take 1 tablet (50 mg total) by mouth every 12 (twelve) hours as needed for up to 5 days for moderate pain or severe pain.       Time coordinating discharge: 35  minutes  The results of significant diagnostics from this hospitalization (including imaging, microbiology, ancillary and laboratory) are listed below for reference.    Procedures and Diagnostic Studies:   CT ABDOMEN PELVIS WO CONTRAST  Result Date: 09/02/2019 CLINICAL DATA:  Diverticulitis suspected. Lower abdominal pain for 1 week. EXAM: CT ABDOMEN AND PELVIS WITHOUT CONTRAST TECHNIQUE: Multidetector CT imaging of the abdomen and pelvis was performed following the standard protocol without IV contrast. COMPARISON:  08/05/2019 FINDINGS: Lower chest:  Biventricular pacer.  Small sliding hiatal hernia. Hepatobiliary: No focal liver abnormality.No evidence of biliary obstruction or stone. Pancreas: Unremarkable. Spleen: Unremarkable. Adrenals/Urinary Tract: Negative adrenals. No hydronephrosis or stone. Unremarkable bladder. Stomach/Bowel: Extensive sigmoid diverticulosis with extensive  stranding around a thickened mid sigmoid diverticulum. No pneumoperitoneum or fluid collection. No bowel obstruction pericecal inflammation. Small sliding hiatal hernia Vascular/Lymphatic: No acute vascular abnormality. Extensive atherosclerotic calcification of the aorta and iliacs. No mass or adenopathy. Reproductive:No acute finding Other: No ascites or pneumoperitoneum. Trace peritoneal fluid seen at the pelvis and at the right colonic gutter, considered reactive Musculoskeletal: No acute abnormalities. Advanced lumbar spine degeneration. IMPRESSION: 1. Sigmoid diverticulitis. Inflammation is extensive but there is no pneumoperitoneum or abscess. 2. Extensive sigmoid diverticulosis. Electronically Signed   By: Monte Fantasia M.D.   On: 09/02/2019 07:43     Labs:   Basic Metabolic Panel: Recent Labs  Lab 09/18/19 0658 09/18/19 0658 09/19/19 0647 09/19/19 0647 09/20/19 0909 09/20/19 0909 09/21/19 0700 09/21/19 0700 09/22/19 0755 09/23/19 0634  NA 133*   < > 133*  --  133*  --  134*  --  132* 135  K 3.8    < > 4.4   < > 4.1   < > 4.1   < > 3.9 3.5  CL 104   < > 104  --  103  --  103  --  104 105  CO2 23   < > 20*  --  18*  --  22  --  20* 21*  GLUCOSE 136*   < > 138*  --  171*  --  135*  --  180* 179*  BUN 21   < > 27*  --  28*  --  33*  --  35* 35*  CREATININE 1.86*   < > 1.92*  --  1.86*  --  2.11*  --  2.17* 2.09*  CALCIUM 8.1*   < > 8.1*  --  8.1*  --  8.2*  --  8.0* 8.0*  MG 2.2  --  2.0  --  1.8  --  2.0  --   --  1.8   < > = values in this interval not displayed.   GFR Estimated Creatinine Clearance: 28.9 mL/min (A) (by C-G formula based on SCr of 2.09 mg/dL (H)). Liver Function Tests: No results for input(s): AST, ALT, ALKPHOS, BILITOT, PROT, ALBUMIN in the last 168 hours. No results for input(s): LIPASE, AMYLASE in the last 168 hours. No results for input(s): AMMONIA in the last 168 hours. Coagulation profile No results for input(s): INR, PROTIME in the last 168 hours.  CBC: Recent Labs  Lab 09/17/19 0539 09/23/19 0634  WBC 10.6* 9.9  NEUTROABS  --  5.9  HGB 10.9* 10.4*  HCT 34.7* 32.4*  MCV 92.8 92.6  PLT 468* 283   Cardiac Enzymes: No results for input(s): CKTOTAL, CKMB, CKMBINDEX, TROPONINI in the last 168 hours. BNP: Invalid input(s): POCBNP CBG: No results for input(s): GLUCAP in the last 168 hours. D-Dimer No results for input(s): DDIMER in the last 72 hours. Hgb A1c No results for input(s): HGBA1C in the last 72 hours. Lipid Profile No results for input(s): CHOL, HDL, LDLCALC, TRIG, CHOLHDL, LDLDIRECT in the last 72 hours. Thyroid function studies No results for input(s): TSH, T4TOTAL, T3FREE, THYROIDAB in the last 72 hours.  Invalid input(s): FREET3 Anemia work up No results for input(s): VITAMINB12, FOLATE, FERRITIN, TIBC, IRON, RETICCTPCT in the last 72 hours. Microbiology Recent Results (from the past 240 hour(s))  SARS CORONAVIRUS 2 (TAT 6-24 HRS) Nasopharyngeal Nasopharyngeal Swab     Status: None   Collection Time: 09/17/19  4:50 AM    Specimen: Nasopharyngeal Swab  Result Value Ref Range  Status   SARS Coronavirus 2 NEGATIVE NEGATIVE Final    Comment: (NOTE) SARS-CoV-2 target nucleic acids are NOT DETECTED.  The SARS-CoV-2 RNA is generally detectable in upper and lower respiratory specimens during the acute phase of infection. Negative results do not preclude SARS-CoV-2 infection, do not rule out co-infections with other pathogens, and should not be used as the sole basis for treatment or other patient management decisions. Negative results must be combined with clinical observations, patient history, and epidemiological information. The expected result is Negative.  Fact Sheet for Patients: SugarRoll.be  Fact Sheet for Healthcare Providers: https://www.woods-mathews.com/  This test is not yet approved or cleared by the [place] FDA and  has been authorized for detection and/or diagnosis of SARS-CoV-2 by FDA under an Emergency Use Authorization (EUA). This EUA will remain  in effect (meaning this test can be used) for the duration of the COVID-19 declaration under Se ction 564(b)(1) of the Act, 21 U.S.C. section 360bbb-3(b)(1), unless the authorization is terminated or revoked sooner.  Performed at [facility], [place] [address], [place], [place] [address]     Please note: You were cared for by a hospitalist during your hospital stay. Once you are discharged, your primary care physician will handle any further medical issues. Please note that NO REFILLS for any discharge medications will be authorized once you are discharged, as it is imperative that you return to your primary care physician (or establish a relationship with a primary care physician if you do not have one) for your post hospital discharge needs so that they can reassess your need for medications and monitor your lab values.  Signed: Terrilee Croak  [facility] 09/23/2019, 11:24  AM

## 2019-09-23 NOTE — Progress Notes (Signed)
Report called to [facility]. Isaias Sakai, LPN took report and all questions answered.

## 2019-09-23 NOTE — TOC Transition Note (Addendum)
Transition of Care [facility]) - CM/SW Discharge Note   Patient Details  Name: [name] MRN: [id number] Date of Birth: 07/02/1932  [facility] CM/SW Contact:  Trula Ore, [place] Phone Number: 09/23/2019, 1:04 PM   Clinical Narrative:     Patient will DC to: [place]  Anticipated DC date: 09/23/2019  Family notified: [name] and [name]   Transport by: Corey Harold  ?  Per MD patient ready for DC to [facility]. RN, patient, patient's family, and facility notified of DC. Discharge Summary sent to facility. RN given number for report tele# [phone]. DC packet on chart. Ambulance transport requested for patient.  CSW signing off.  Final next level of care: Skilled Nursing Facility Barriers to Discharge: No Barriers Identified   Patient Goals and CMS Choice Patient states their goals for this hospitalization and ongoing recovery are:: to go to SNF CMS Medicare.gov Compare Post Acute Care list provided to:: Patient Represenative (must comment) (daughter [name]) Choice offered to / list presented to : Adult Children [name])  Discharge Placement              Patient chooses bed at: [facility] Patient to be transferred to facility by: [place] Name of family member notified: [place] Spouse Patient and family notified of of transfer: 09/23/19  Discharge Plan and Services   Discharge Planning Services: CM Consult Post Acute Care Choice: Home Health                               Social Determinants of Health (SDOH) Interventions     Readmission Risk Interventions Readmission Risk Prevention Plan 09/15/2019 12/01/2018  Transportation Screening Complete Complete  PCP or Specialist Appt within 5-7 Days - Complete  Home Care Screening - Complete  Medication Review (RN CM) - Complete  HRI or Home Care Consult Complete -  Social Work Consult for [place] Planning/Counseling Complete -  Palliative Care Screening Not  Applicable -  Medication Review Press photographer) Complete -  Some recent data might be hidden

## 2019-09-24 ENCOUNTER — Telehealth: Payer: Self-pay

## 2019-09-24 DIAGNOSIS — N184 Chronic kidney disease, stage 4 (severe): Secondary | ICD-10-CM | POA: Diagnosis not present

## 2019-09-24 DIAGNOSIS — K5792 Diverticulitis of intestine, part unspecified, without perforation or abscess without bleeding: Secondary | ICD-10-CM | POA: Diagnosis not present

## 2019-09-24 DIAGNOSIS — I10 Essential (primary) hypertension: Secondary | ICD-10-CM | POA: Diagnosis not present

## 2019-09-24 DIAGNOSIS — I5042 Chronic combined systolic (congestive) and diastolic (congestive) heart failure: Secondary | ICD-10-CM | POA: Diagnosis not present

## 2019-09-24 NOTE — Telephone Encounter (Signed)
[name] from [facility]. The pt was shocked from his ICD. He did not have any symptoms. I asked was him monitor was in his room she states she is not sure. I told to let the family know he needs his monitor. I let [name] speak with Amy, rn.   He needs an appointment with Dr. Curt Bears.

## 2019-09-24 NOTE — Telephone Encounter (Signed)
Caller from Rehab facility that pt was admitted to last night.  Reports pt was completing PT when he received a shock from his ICD today.  Pt does not have remote monitor at facility.  [name] will contact family to have them bring monitor to pt/ send transmission.  She reports pt denies any cardiac symptoms, both before and after shock.  Advised of shock plan, if pt receives another shock in 24 hour period regarding less of symptoms, pt needs to be seen in ED.

## 2019-09-24 NOTE — Telephone Encounter (Signed)
Transmission received and reviewed. D/W AS, NP, pt needs urgent office visit tomorrow 11am for med adjustment  and device reprogramming.   Spoke with Geophysicist/field seismologist at [facility].  Advised of situation and need for appt.  She will try to arrange transportation but feels if she is not able due to lateness, family should be able to transport him.  Educated Scientist, clinical (histocompatibility and immunogenetics) on ED precautions, if pt receives another shock today regardless of symptoms or becomes symptomatic he needs to go to ED.  At that time, pt son walked up to nurses station, I spoke with Lanny Hurst and explained situation.  He v/u of importance for pt visit tomorrow and took information for office stating if facility cannot arrange for transportation then he will.    Called pt wife, [name], [place] on file.  Advised of situation and appointment tomorrow.  Also informed her of ED precautions that were given to nurse at [facility].

## 2019-09-24 NOTE — Telephone Encounter (Signed)
Transmission received.

## 2019-09-25 ENCOUNTER — Other Ambulatory Visit: Payer: Self-pay

## 2019-09-25 ENCOUNTER — Ambulatory Visit: Payer: Medicare HMO | Admitting: Nurse Practitioner

## 2019-09-25 ENCOUNTER — Encounter: Payer: Self-pay | Admitting: Nurse Practitioner

## 2019-09-25 VITALS — BP 78/48 | HR 72 | Ht 72.0 in | Wt 198.0 lb

## 2019-09-25 DIAGNOSIS — I472 Ventricular tachycardia, unspecified: Secondary | ICD-10-CM

## 2019-09-25 DIAGNOSIS — I5022 Chronic systolic (congestive) heart failure: Secondary | ICD-10-CM | POA: Diagnosis not present

## 2019-09-25 DIAGNOSIS — Z95810 Presence of automatic (implantable) cardiac defibrillator: Secondary | ICD-10-CM

## 2019-09-25 DIAGNOSIS — N1832 Chronic kidney disease, stage 3b: Secondary | ICD-10-CM | POA: Diagnosis not present

## 2019-09-25 DIAGNOSIS — I2510 Atherosclerotic heart disease of native coronary artery without angina pectoris: Secondary | ICD-10-CM

## 2019-09-25 DIAGNOSIS — I480 Paroxysmal atrial fibrillation: Secondary | ICD-10-CM | POA: Diagnosis not present

## 2019-09-25 MED ORDER — SOTALOL HCL 80 MG PO TABS
80.0000 mg | ORAL_TABLET | Freq: Two times a day (BID) | ORAL | 3 refills | Status: DC
Start: 2019-09-25 — End: 2019-11-02

## 2019-09-25 NOTE — Progress Notes (Signed)
Electrophysiology Office Note Date: 09/25/2019  ID:  [name], DOB 1932-10-08, MRN [id number]  PCP: [name], MD Primary Cardiologist: [name] Electrophysiologist: [name]  CC: sustained VT and ICD shock  [name] is a 86 y.o. male seen today for Dr [name].  He presents today for urgent add on electrophysiology followup.  He recently had a prolonged admission for diverticulitis and was noted to have NSVT on telemetry while admitted. His Sotalol was increased during last admission.  Yesterday, he had 15 hours of sustained VT below ICD detection followed by HV therapy once rhythm accelerated.  He was therefore added to my schedule today. Since discharge, he has been in rehab. He feels that he is getting stronger, but has a ways to go.   He denies chest pain, palpitations, PND, orthopnea, nausea, vomiting, syncope, edema, weight gain, or early satiety.      Past Medical History:  Diagnosis Date  . Arthritis    "minor everywhere" (04/22/2015)  . Asthma    "a touch"  . Barrett esophagus   . CAD (coronary artery disease)    a. s/p 3 vvessel CABG with LIMA to LAD, SVG to diagonal 1, SVG to RCA 12/09.  . Cardiac resynchronization therapy defibrillator (CRT-D) in place   . Chronic anticoagulation 01/23/2013  . Chronic combined systolic and diastolic CHF (congestive heart failure) (HCC)    dry weight 197-200lbs.  . CKD (chronic kidney disease) stage 3, GFR 30-59 ml/min 08/23/2014  . DCM (dilated cardiomyopathy) ([place])    initially concerned for TTP amyloidosis but Tc-PYP scan 06/2017 was not consistent with amyloid and SPEP/UPEP with no M spike. EF 20-25% by echo 2019  . Diverticulitis 08/2019  . Dyslipidemia 01/23/2013  . Essential hypertension 01/23/2013  . GERD (gastroesophageal reflux disease)   . GI bleed 05/08/2017  . H/O: rheumatic fever   . Hepatitis 1957   "don't know what kind:  . History of hiatal hernia   . History of PFTs    a. Amiodarone started 5/16 >>  PFTs w/ DLCO 6/16:  FEV1 72% predicted, FEV1/FVC 66%, DLCO 66% >> minimal reversible obstructive airways disease with mild diffusion defect (suggestive of emphysema but absence of hyperinflation inconsistent with dx)  . Hyperkalemia 08/23/2014  . Hyperthyroidism 05/26/2017  . Leg weakness 01/28/2017  . Multiple thyroid nodules 08/13/2017  . Orthostatic hypotension   . Persistent atrial fibrillation ([place]) 01/23/2013  . Pleural effusion on right 05/26/2017  . Pneumonia 08/2014   "dr thought I may have had a touch"  . Pre-diabetes   . Protein-calorie malnutrition, severe 05/27/2017  . S/P AVR (aortic valve replacement) 2009   a. severe AS s/p AVR with pericardial tissue valve 2009.  Marland Kitchen Shingles 08/23/2014  . VT (ventricular tachycardia) ([place]) 05/06/2017   Past Surgical History:  Procedure Laterality Date  . AORTIC VALVE REPLACEMENT  with 23-mm Magna Ease pericardial valve, model number   with 23-mm Magna Ease pericardial valve, model number3300TFX, serial number [id number]   . APPENDECTOMY  1940s  . BI-VENTRICULAR IMPLANTABLE CARDIOVERTER DEFIBRILLATOR  (CRT-D)  04/22/2015  . CARDIAC CATHETERIZATION N/A 11/04/2014   Procedure: Left Heart Cath and Cors/Grafts Angiography;  Surgeon: Leonie Man, MD;  Location: [place] [facility];  Service: Cardiovascular;  Laterality: N/A;  . CARDIAC VALVE REPLACEMENT    . CARDIOVERSION N/A 01/27/2013   Procedure: CARDIOVERSION;  Surgeon: Sueanne Margarita, MD;  Location: [place] [facility];  Service: Cardiovascular;  Laterality: N/A;  . CARDIOVERSION N/A 08/25/2014   Procedure:  CARDIOVERSION;  Surgeon: Sanda Klein, MD;  Location: [place];  Service: Cardiovascular;  Laterality: N/A;  . CARDIOVERSION N/A 05/28/2018   Procedure: CARDIOVERSION;  Surgeon: Pixie Casino, MD;  Location: [facility] ENDOSCOPY;  Service: Cardiovascular;  Laterality: N/A;  . CATARACT EXTRACTION W/ INTRAOCULAR LENS  IMPLANT, BILATERAL Bilateral   . CHEST TUBE INSERTION Right 07/26/2017   Procedure:  INSERTION PLEURAL DRAINAGE CATHETER - RIGHT;  Surgeon: Ivin Poot, MD;  Location: [place];  Service: Thoracic;  Laterality: Right;  . CORONARY ARTERY BYPASS GRAFT  03/10/2008   x 3 Dr. Roxan Hockey  . EP IMPLANTABLE DEVICE N/A 04/22/2015   Procedure: BiV ICD Insertion CRT-D;  Surgeon: [name], MD;  Location: [place] [facility];  Service: Cardiovascular;  Laterality: N/A;  . ESOPHAGOGASTRODUODENOSCOPY (EGD) WITH ESOPHAGEAL DILATION  X1  . ESOPHAGOGASTRODUODENOSCOPY (EGD) WITH PROPOFOL N/A 05/09/2017   Procedure: ESOPHAGOGASTRODUODENOSCOPY (EGD) WITH PROPOFOL;  Surgeon: Carol Ada, MD;  Location: [place];  Service: Endoscopy;  Laterality: N/A;  . IR THORACENTESIS ASP PLEURAL SPACE W/IMG GUIDE  05/27/2017  . IR THORACENTESIS ASP PLEURAL SPACE W/IMG GUIDE  06/17/2017  . IR THORACENTESIS ASP PLEURAL SPACE W/IMG GUIDE  06/18/2017  . IR THORACENTESIS ASP PLEURAL SPACE W/IMG GUIDE  07/11/2017  . REMOVAL OF PLEURAL DRAINAGE CATHETER Right 09/13/2017   Procedure: REMOVAL OF PLEURAL DRAINAGE CATHETER;  Surgeon: Ivin Poot, MD;  Location: [place];  Service: Thoracic;  Laterality: Right;  . RIGHT HEART CATH AND CORONARY/GRAFT ANGIOGRAPHY N/A 05/31/2017   Procedure: RIGHT HEART CATH AND CORONARY/GRAFT ANGIOGRAPHY;  Surgeon: Jolaine Artist, MD;  Location: [place] [facility];  Service: Cardiovascular;  Laterality: N/A;  . TONSILLECTOMY      Current Outpatient Medications  Medication Sig Dispense Refill  . amoxicillin-clavulanate (AUGMENTIN) 500-125 MG tablet Take 1 tablet (500 mg total) by mouth every 12 (twelve) hours for 7 days.    Marland Kitchen atorvastatin (LIPITOR) 20 MG tablet TAKE 1 TABLET EVERY DAY  AT  6  PM (Patient taking differently: Take 20 mg by mouth every evening. ) 90 tablet 2  . bisacodyl (DULCOLAX) 5 MG EC tablet Take 1 tablet (5 mg total) by mouth daily as needed for moderate constipation. 30 tablet 0  . carvedilol (COREG) 25 MG tablet TAKE 1 TABLET TWICE DAILY (DOSE INCREASE)  (Patient taking differently: Take 25 mg by mouth 2 (two) times daily with a meal. ) 180 tablet 3  . docusate sodium (COLACE) 100 MG capsule Take 1 capsule (100 mg total) by mouth 2 (two) times daily. 10 capsule 0  . ELIQUIS 2.5 MG TABS tablet TAKE 1 TABLET TWICE DAILY (Patient taking differently: Take 2.5 mg by mouth 2 (two) times daily. ) 180 tablet 1  . famotidine (PEPCID) 40 MG tablet Take 40 mg by mouth every evening.     . furosemide (LASIX) 40 MG tablet Take 1 tablet (40 mg total) by mouth daily as needed for edema.    . isosorbide mononitrate (IMDUR) 30 MG 24 hr tablet TAKE 1 TABLET EVERY DAY (Patient taking differently: Take 30 mg by mouth every evening. ) 90 tablet 2  . mexiletine (MEXITIL) 150 MG capsule Take 2 capsules (300 mg total) by mouth every 12 (twelve) hours.    . Multiple Vitamin (MULTIVITAMIN WITH MINERALS) TABS tablet Take 1 tablet by mouth daily.     . Omega-3 Fatty Acids (FISH OIL) 1000 MG CAPS Take 2,000 mg by mouth 2 (two) times daily.    . pantoprazole (PROTONIX) 40  MG tablet Take 40 mg by mouth daily.    Marland Kitchen saccharomyces boulardii (FLORASTOR) 250 MG capsule Take 1 capsule (250 mg total) by mouth 2 (two) times daily for 14 days.    . sotalol (BETAPACE) 80 MG tablet Take 0.5 tablets (40 mg total) by mouth 2 (two) times daily. 90 tablet 2  . traMADol (ULTRAM) 50 MG tablet Take 1 tablet (50 mg total) by mouth every 12 (twelve) hours as needed for up to 5 days for moderate pain or severe pain. 10 tablet 0   No current facility-administered medications for this visit.    Allergies:   Novocain [procaine] and Amiodarone   Social History: Social History   Socioeconomic History  . Marital status: Married    Spouse name: Not on file  . Number of children: Not on file  . Years of education: Not on file  . Highest education level: Not on file  Occupational History  . Not on file  Tobacco Use  . Smoking status: Never Smoker  . Smokeless tobacco: Never Used  Vaping Use   . Vaping Use: Never used  Substance and Sexual Activity  . Alcohol use: Not Currently    Comment: 04/22/2015 "nothing since the 1980s; never had a problem w/it"  . Drug use: No  . Sexual activity: Not Currently  Other Topics Concern  . Not on file  Social History Narrative   Married, lives with spouse [name]   4 children, 2 boys and 2 girls > one daughter passed   OCCUPATION: retired, Pension scheme manager for [employer] Strain:   . Difficulty of Paying Living Expenses:   Food Insecurity:   . Worried About Charity fundraiser in the Last Year:   . Arboriculturist in the Last Year:   Transportation Needs:   . Film/video editor (Medical):   Marland Kitchen Lack of Transportation (Non-Medical):   Physical Activity:   . Days of Exercise per Week:   . Minutes of Exercise per Session:   Stress:   . Feeling of Stress :   Social Connections:   . Frequency of Communication with Friends and Family:   . Frequency of Social Gatherings with Friends and Family:   . Attends Religious Services:   . Active Member of Clubs or Organizations:   . Attends Archivist Meetings:   Marland Kitchen Marital Status:   Intimate Partner Violence:   . Fear of Current or Ex-Partner:   . Emotionally Abused:   Marland Kitchen Physically Abused:   . Sexually Abused:     Family History: Family History  Problem Relation Age of Onset  . Alzheimer's disease Mother   . COPD Daughter   . Alzheimer's disease Other   . COPD Sister   . Depression Sister   . Heart disease Sister   . Hyperlipidemia Sister   . Hypertension Sister   . Esophageal cancer Sister        + smoker    Review of Systems: All other systems reviewed and are otherwise negative except as noted above.   Physical Exam: VS:  There were no vitals taken for this visit. , BMI There is no height or weight on file to calculate BMI.  GEN- The patient is elderly and frail appearing, alert and oriented x 3  today.   HEENT: normocephalic, atraumatic; sclera clear, conjunctiva pink; hearing intact; oropharynx clear; neck supple  Lungs- Clear to ausculation bilaterally, normal work  of breathing.  No wheezes, rales, rhonchi Heart- Tachycardic regular rate and rhythm  GI- soft, non-tender, non-distended, bowel sounds present  Extremities- no clubbing, cyanosis, or edema  MS- no significant deformity or atrophy Skin- warm and dry, no rash or lesion; ICD pocket well healed Psych- euthymic mood, full affect Neuro- strength and sensation are intact  ICD interrogation- reviewed in detail today,  See PACEART report  EKG:  EKG is ordered today. EKG today shows VT, rate 143  Recent Labs: 11/14/2018: B Natriuretic Peptide 313.2 09/08/2019: ALT 25 09/23/2019: BUN 35; Creatinine, Ser 2.09; Hemoglobin 10.4; Magnesium 1.8; Platelets 283; Potassium 3.5; Sodium 135   Wt Readings from Last 3 Encounters:  09/23/19 198 lb 4.8 oz (89.9 kg)  08/06/19 200 lb (90.7 kg)  04/17/19 199 lb (90.3 kg)     Other studies Reviewed: Additional studies/ records that were reviewed today include: hospital records    Assessment and Plan:  1.  Ventricular tachycardia Sustained below ICD detection for 15 hours yesterday. Rhythm eventually accelerated and was terminated by HV therapy. He presents in the office today in a double tachycardia (atrial flutter and sustained VT).  Under Dr Jackalyn Lombard supervision, VT was pace terminated and tachy zones were reprogrammed to allow for earlier detection of VT. We also attempted to pace terminate atrial flutter but were not successful.  Continue Sotalol and Mexiletine. Sotalol was increased to 65m bid during recent admission. It appears he was discharged on 433mbid at discharge. Will increase back to 8064mid He has previously been intolerant of amiodarone Will update labs today ICD reprogrammed to lower VT zone   2.  Chronic systolic dysfunction Slightly volume overloaded on exam today  - hopefully will improve with SR. With renal function, do not want to push diuresis. Stable on an appropriate medical regimen Normal ICD function See Pace Art report  3.  CAD No recent ischemic symptoms  4.  CKD, stage IV BMET as above   5.  Paroxysmal atrial fibrillation Continue Eliquis   He is very ill today and at high risk of decompensation. I have advised that if there are any concerns over the weekend for recurrent sustained tachycardia or worsening HF [facility]   He ultimately may benefit from palliative care consult.   Current medicines are reviewed at length with the patient today.   The patient does not have concerns regarding his medicines.  The following changes were made today:  Increase Sotalol to 33m64md  Labs/ tests ordered today include: BMET, Mg No orders of the defined types were placed in this encounter.    Disposition:   Follow up with Dr CamnCurt Bearst week     Signed, AmbeChanetta Marshall 09/25/2019 11:00 AM  [place] [phone]) [phone])

## 2019-09-25 NOTE — Patient Instructions (Signed)
Medication Instructions:  Your physician has recommended you make the following change in your medication:  --INCREASE Sotalol to 80 mg - Take 1 tablet (80 mg) by mouth twice daily  *If you need a refill on your cardiac medications before your next appointment, please call your pharmacy*  Lab Work: Your physician has recommended that you have lab work today: Art gallery manager and [place] If you have labs (blood work) drawn today and your tests are completely normal, you will receive your results only by: Marland Kitchen MyChart Message (if you have MyChart) OR . A paper copy in the mail If you have any lab test that is abnormal or we need to change your treatment, we will call you to review the results.  Testing/Procedures: None Ordered  Follow-Up: At [facility], you and your health needs are our priority.  As part of our continuing mission to provide you with exceptional heart care, we have created designated Provider Care Teams.  These Care Teams include your primary Cardiologist (physician) and Advanced Practice Providers (APPs -  Physician Assistants and Nurse Practitioners) who all work together to provide you with the care you need, when you need it.  We recommend signing up for the patient portal called "MyChart".  Sign up information is provided on this After Visit Summary.  MyChart is used to connect with patients for Virtual Visits (Telemedicine).  Patients are able to view lab/test results, encounter notes, upcoming appointments, etc.  Non-urgent messages can be sent to your provider as well.   To learn more about what you can do with MyChart, go to [email or web address].    Your next appointment:   Your physician recommends that you have a follow-up appointment on Thursday October 01, 2019 at 11:45 am  Remote monitoring is used to monitor your ICD from home. This monitoring reduces the number of office visits required to check your device to one time per year. It allows [place] to keep an eye on the functioning  of your device to ensure it is working properly. You are scheduled for a device check from home on 11/17/19. You may send your transmission at any time that day. If you have a wireless device, the transmission will be sent automatically. After your physician reviews your transmission, you will receive a postcard with your next transmission date.  The format for your next appointment:   In Person  Provider:   Allegra Lai, MD

## 2019-09-26 LAB — BASIC METABOLIC PANEL
BUN/Creatinine Ratio: 17 (ref 10–24)
BUN: 33 mg/dL — ABNORMAL HIGH (ref 8–27)
CO2: 19 mmol/L — ABNORMAL LOW (ref 20–29)
Calcium: 8.4 mg/dL — ABNORMAL LOW (ref 8.6–10.2)
Chloride: 105 mmol/L (ref 96–106)
Creatinine, Ser: 1.9 mg/dL — ABNORMAL HIGH (ref 0.76–1.27)
GFR calc Af Amer: 37 mL/min/{1.73_m2} — ABNORMAL LOW (ref >59)
GFR calc non Af Amer: 32 mL/min/{1.73_m2} — ABNORMAL LOW (ref >59)
Glucose: 147 mg/dL — ABNORMAL HIGH (ref 65–99)
Potassium: 5 mmol/L (ref 3.5–5.2)
Sodium: 136 mmol/L (ref 134–144)

## 2019-09-26 LAB — MAGNESIUM: Magnesium: 2.1 mg/dL (ref 1.6–2.3)

## 2019-09-29 ENCOUNTER — Telehealth: Payer: Self-pay

## 2019-09-29 NOTE — Telephone Encounter (Signed)
-----  Message from Patsey Berthold, NP sent at 09/26/2019  9:35 AM EDT ----- Please notify patient of stable labs. Thanks!

## 2019-09-29 NOTE — Telephone Encounter (Signed)
Per DPR spoke with patients wife who is aware and agreeable to stable labs. Pts wife informed me that the pt is currently in rehab after being released from the hospital after a 2-3 wk stay due to a ruptured bowel. Pts wife states he is stable now but having to stay at nursing home for rehab to get back on his feet and walking again.

## 2019-10-01 ENCOUNTER — Telehealth: Payer: Self-pay

## 2019-10-01 ENCOUNTER — Encounter: Payer: Self-pay | Admitting: Cardiology

## 2019-10-01 ENCOUNTER — Other Ambulatory Visit: Payer: Self-pay

## 2019-10-01 ENCOUNTER — Ambulatory Visit (INDEPENDENT_AMBULATORY_CARE_PROVIDER_SITE_OTHER): Payer: Medicare HMO | Admitting: Cardiology

## 2019-10-01 VITALS — BP 132/74 | HR 96 | Ht 72.0 in | Wt 194.4 lb

## 2019-10-01 DIAGNOSIS — I480 Paroxysmal atrial fibrillation: Secondary | ICD-10-CM | POA: Diagnosis not present

## 2019-10-01 DIAGNOSIS — I5042 Chronic combined systolic (congestive) and diastolic (congestive) heart failure: Secondary | ICD-10-CM

## 2019-10-01 DIAGNOSIS — I472 Ventricular tachycardia, unspecified: Secondary | ICD-10-CM

## 2019-10-01 DIAGNOSIS — Z79899 Other long term (current) drug therapy: Secondary | ICD-10-CM

## 2019-10-01 DIAGNOSIS — R0602 Shortness of breath: Secondary | ICD-10-CM | POA: Diagnosis not present

## 2019-10-01 DIAGNOSIS — J189 Pneumonia, unspecified organism: Secondary | ICD-10-CM | POA: Diagnosis not present

## 2019-10-01 DIAGNOSIS — R05 Cough: Secondary | ICD-10-CM | POA: Diagnosis not present

## 2019-10-01 DIAGNOSIS — K5792 Diverticulitis of intestine, part unspecified, without perforation or abscess without bleeding: Secondary | ICD-10-CM | POA: Diagnosis not present

## 2019-10-01 LAB — CUP PACEART INCLINIC DEVICE CHECK
Battery Remaining Longevity: 28 mo
Battery Voltage: 2.91 V
Brady Statistic RA Percent Paced: 0.22 %
Brady Statistic RV Percent Paced: 64.35 %
Date Time Interrogation Session: 20210624105500
HighPow Impedance: 67 Ohm
Implantable Lead Implant Date: 20170113
Implantable Lead Implant Date: 20170113
Implantable Lead Implant Date: 20170113
Implantable Lead Location: 753858
Implantable Lead Location: 753859
Implantable Lead Location: 753860
Implantable Lead Model: 4298
Implantable Lead Model: 5076
Implantable Pulse Generator Implant Date: 20170113
Lead Channel Impedance Value: 380 Ohm
Lead Channel Impedance Value: 380 Ohm
Lead Channel Impedance Value: 380 Ohm
Lead Channel Impedance Value: 399 Ohm
Lead Channel Impedance Value: 399 Ohm
Lead Channel Impedance Value: 399 Ohm
Lead Channel Impedance Value: 456 Ohm
Lead Channel Impedance Value: 513 Ohm
Lead Channel Impedance Value: 665 Ohm
Lead Channel Impedance Value: 703 Ohm
Lead Channel Impedance Value: 760 Ohm
Lead Channel Impedance Value: 779 Ohm
Lead Channel Impedance Value: 779 Ohm
Lead Channel Pacing Threshold Amplitude: 0.75 V
Lead Channel Pacing Threshold Amplitude: 0.75 V
Lead Channel Pacing Threshold Pulse Width: 0.4 ms
Lead Channel Pacing Threshold Pulse Width: 0.4 ms
Lead Channel Sensing Intrinsic Amplitude: 1.8 mV
Lead Channel Sensing Intrinsic Amplitude: 6.5 mV
Lead Channel Setting Pacing Amplitude: 1.5 V
Lead Channel Setting Pacing Amplitude: 1.75 V
Lead Channel Setting Pacing Amplitude: 2 V
Lead Channel Setting Pacing Pulse Width: 0.4 ms
Lead Channel Setting Pacing Pulse Width: 0.4 ms
Lead Channel Setting Sensing Sensitivity: 0.3 mV

## 2019-10-01 NOTE — Progress Notes (Signed)
Electrophysiology Office Note   Date:  10/01/2019   ID:  [name], DOB 10-Jun-1932, MRN [id number]  PCP:  [name], MD  Cardiologist:  [name] Primary Electrophysiologist: Claryce Friel Meredith Leeds, MD    No chief complaint on file.    History of Present Illness: [name] is a 86 y.o. male who presents today for electrophysiology evaluation.   He has a history of hypertension, aortic stenosis status post AVR, chronic combined systolic and diastolic CHF in the setting of A. fib with RVR, orthostatic hypotension, dilated cardiomyopathy EF 25-30%, status post CABG, and paroxysmal atrial fibrillation.  He does have paroxysmal atrial fibrillation has been cardioverted in the past and is now on a fixed within with rhythm control via amiodarone. He had CRT-D upgrade on 04/22/15.  He was having VT below his detection limit.  Device limits were changed in the last visit and he was put on mexiletine.  He was recently hospitalized for systolic heart failure with aggressive diuresis and thoracentesis.  He had a recent prolonged admission for diverticulitis and was noted to have nonsustained VT on telemetry.  His sotalol dose was increased.  After discharge, he was noted to have a prolonged episodes of ventricular tachycardia below ICD detection.  His rate eventually celebrated and was terminated by high-voltage therapy.  He presented to cardiology clinic in a double tachycardia with atrial flutter and nonsustained VT.  VT was terminated with pacing in clinic.  His sotalol was increased to 80 mg twice daily as it was during his admission and his mexiletine was continued.  He has been intolerant of amiodarone.  Today, denies symptoms of palpitations, chest pain, orthopnea, PND, lower extremity edema, claudication, dizziness, presyncope, syncope, bleeding, or neurologic sequela. The patient is tolerating medications without difficulties.  He continues to have issues with weakness and fatigue.   Fortunately he is not in ventricular tachycardia today.  He is in atrial flutter.  He also has an elevated OptiVol.  He says that he gets short of breath quite easily as well as has fatigue.  Past Medical History:  Diagnosis Date  . Arthritis    "minor everywhere" (04/22/2015)  . Asthma    "a touch"  . Barrett esophagus   . CAD (coronary artery disease)    a. s/p 3 vvessel CABG with LIMA to LAD, SVG to diagonal 1, SVG to RCA 12/09.  . Cardiac resynchronization therapy defibrillator (CRT-D) in place   . Chronic anticoagulation 01/23/2013  . Chronic combined systolic and diastolic CHF (congestive heart failure) (HCC)    dry weight 197-200lbs.  . CKD (chronic kidney disease) stage 3, GFR 30-59 ml/min 08/23/2014  . DCM (dilated cardiomyopathy) ([place])    initially concerned for TTP amyloidosis but Tc-PYP scan 06/2017 was not consistent with amyloid and SPEP/UPEP with no M spike. EF 20-25% by echo 2019  . Diverticulitis 08/2019  . Dyslipidemia 01/23/2013  . Essential hypertension 01/23/2013  . GERD (gastroesophageal reflux disease)   . GI bleed 05/08/2017  . H/O: rheumatic fever   . Hepatitis 1957   "don't know what kind:  . History of hiatal hernia   . History of PFTs    a. Amiodarone started 5/16 >> PFTs w/ DLCO 6/16:  FEV1 72% predicted, FEV1/FVC 66%, DLCO 66% >> minimal reversible obstructive airways disease with mild diffusion defect (suggestive of emphysema but absence of hyperinflation inconsistent with dx)  . Hyperkalemia 08/23/2014  . Hyperthyroidism 05/26/2017  . Leg weakness 01/28/2017  . Multiple thyroid  nodules 08/13/2017  . Orthostatic hypotension   . Persistent atrial fibrillation ([place]) 01/23/2013  . Pleural effusion on right 05/26/2017  . Pneumonia 08/2014   "dr thought I may have had a touch"  . Pre-diabetes   . Protein-calorie malnutrition, severe 05/27/2017  . S/P AVR (aortic valve replacement) 2009   a. severe AS s/p AVR with pericardial tissue valve 2009.  Marland Kitchen Shingles  08/23/2014  . VT (ventricular tachycardia) ([place]) 05/06/2017   Past Surgical History:  Procedure Laterality Date  . AORTIC VALVE REPLACEMENT  with 23-mm Magna Ease pericardial valve, model number   with 23-mm Magna Ease pericardial valve, model [id number], serial number [id number]   . APPENDECTOMY  1940s  . BI-VENTRICULAR IMPLANTABLE CARDIOVERTER DEFIBRILLATOR  (CRT-D)  04/22/2015  . CARDIAC CATHETERIZATION N/A 11/04/2014   Procedure: Left Heart Cath and Cors/Grafts Angiography;  Surgeon: Leonie Man, MD;  Location: [facility];  Service: Cardiovascular;  Laterality: N/A;  . CARDIAC VALVE REPLACEMENT    . CARDIOVERSION N/A 01/27/2013   Procedure: CARDIOVERSION;  Surgeon: Sueanne Margarita, MD;  Location: [facility];  Service: Cardiovascular;  Laterality: N/A;  . CARDIOVERSION N/A 08/25/2014   Procedure: CARDIOVERSION;  Surgeon: Sanda Klein, MD;  Location: [facility];  Service: Cardiovascular;  Laterality: N/A;  . CARDIOVERSION N/A 05/28/2018   Procedure: CARDIOVERSION;  Surgeon: Pixie Casino, MD;  Location: [facility];  Service: Cardiovascular;  Laterality: N/A;  . CATARACT EXTRACTION W/ INTRAOCULAR LENS  IMPLANT, BILATERAL Bilateral   . CHEST TUBE INSERTION Right 07/26/2017   Procedure: INSERTION PLEURAL DRAINAGE CATHETER - RIGHT;  Surgeon: Ivin Poot, MD;  Location: [facility];  Service: Thoracic;  Laterality: Right;  . CORONARY ARTERY BYPASS GRAFT  03/10/2008   x 3 Dr. Roxan Hockey  . EP IMPLANTABLE DEVICE N/A 04/22/2015   Procedure: BiV ICD Insertion CRT-D;  Surgeon: Halima Fogal Meredith Leeds, MD;  Location: [facility];  Service: Cardiovascular;  Laterality: N/A;  . ESOPHAGOGASTRODUODENOSCOPY (EGD) WITH ESOPHAGEAL DILATION  X1  . ESOPHAGOGASTRODUODENOSCOPY (EGD) WITH PROPOFOL N/A 05/09/2017   Procedure: ESOPHAGOGASTRODUODENOSCOPY (EGD) WITH PROPOFOL;  Surgeon: Carol Ada, MD;  Location: [facility];  Service: Endoscopy;  Laterality: N/A;  . IR THORACENTESIS ASP PLEURAL  SPACE W/IMG GUIDE  05/27/2017  . IR THORACENTESIS ASP PLEURAL SPACE W/IMG GUIDE  06/17/2017  . IR THORACENTESIS ASP PLEURAL SPACE W/IMG GUIDE  06/18/2017  . IR THORACENTESIS ASP PLEURAL SPACE W/IMG GUIDE  07/11/2017  . REMOVAL OF PLEURAL DRAINAGE CATHETER Right 09/13/2017   Procedure: REMOVAL OF PLEURAL DRAINAGE CATHETER;  Surgeon: Ivin Poot, MD;  Location: [facility];  Service: Thoracic;  Laterality: Right;  . RIGHT HEART CATH AND CORONARY/GRAFT ANGIOGRAPHY N/A 05/31/2017   Procedure: RIGHT HEART CATH AND CORONARY/GRAFT ANGIOGRAPHY;  Surgeon: Jolaine Artist, MD;  Location: [facility];  Service: Cardiovascular;  Laterality: N/A;  . TONSILLECTOMY       Current Outpatient Medications  Medication Sig Dispense Refill  . atorvastatin (LIPITOR) 20 MG tablet TAKE 1 TABLET EVERY DAY  AT  6  PM 90 tablet 2  . bisacodyl (DULCOLAX) 5 MG EC tablet Take 1 tablet (5 mg total) by mouth daily as needed for moderate constipation. 30 tablet 0  . carvedilol (COREG) 25 MG tablet TAKE 1 TABLET TWICE DAILY (DOSE INCREASE) 180 tablet 3  . docusate sodium (COLACE) 100 MG capsule Take 1 capsule (100 mg total) by mouth 2 (two) times daily. 10 capsule 0  . ELIQUIS 2.5 MG TABS tablet TAKE 1 TABLET  TWICE DAILY 180 tablet 1  . famotidine (PEPCID) 40 MG tablet Take 40 mg by mouth every evening.     . furosemide (LASIX) 40 MG tablet Take 1 tablet (40 mg total) by mouth daily as needed for edema.    . isosorbide mononitrate (IMDUR) 30 MG 24 hr tablet TAKE 1 TABLET EVERY DAY 90 tablet 2  . mexiletine (MEXITIL) 150 MG capsule Take 2 capsules (300 mg total) by mouth every 12 (twelve) hours.    . Multiple Vitamin (MULTIVITAMIN WITH MINERALS) TABS tablet Take 1 tablet by mouth daily.     . Omega-3 Fatty Acids (FISH OIL) 1000 MG CAPS Take 2,000 mg by mouth 2 (two) times daily.    . pantoprazole (PROTONIX) 40 MG tablet Take 40 mg by mouth daily.    Marland Kitchen saccharomyces boulardii (FLORASTOR) 250 MG capsule Take 1 capsule (250 mg  total) by mouth 2 (two) times daily for 14 days.    . sotalol (BETAPACE) 80 MG tablet Take 1 tablet (80 mg total) by mouth 2 (two) times daily. 180 tablet 3   No current facility-administered medications for this visit.    Allergies:   Novocain [procaine] and Amiodarone   Social History:  The patient  reports that he has never smoked. He has never used smokeless tobacco. He reports previous alcohol use. He reports that he does not use drugs.   Family History:  The patient's family history includes Alzheimer's disease in his mother and another family member; COPD in his daughter and sister; Depression in his sister; Esophageal cancer in his sister; Heart disease in his sister; Hyperlipidemia in his sister; Hypertension in his sister.   ROS:  Please see the history of present illness.   Otherwise, review of systems is positive for none.   All other systems are reviewed and negative.   PHYSICAL EXAM: VS:  BP 132/74   Pulse 96   Ht 6' (1.829 m)   Wt 194 lb 6.4 oz (88.2 kg)   SpO2 95%   BMI 26.37 kg/m  , BMI Body mass index is 26.37 kg/m. GEN: Well nourished, well developed, in no acute distress  HEENT: normal  Neck: no JVD, carotid bruits, or masses Cardiac: irregular; no murmurs, rubs, or gallops,no edema  Respiratory:  clear to auscultation bilaterally, normal work of breathing GI: soft, nontender, nondistended, + BS MS: no deformity or atrophy  Skin: warm and dry, device site well healed Neuro:  Strength and sensation are intact Psych: euthymic mood, full affect  EKG:  EKG is ordered today. Personal review of the ekg ordered shows ventricular paced, PVCs, rate 96-2 027 4 years ago before my computer would say my partner ordering so right  Personal review of the device interrogation today. Results in [place]: 11/14/2018: B Natriuretic Peptide 313.2 09/08/2019: ALT 25 09/23/2019: Hemoglobin 10.4; Platelets 283 09/25/2019: BUN 33; Creatinine, Ser 1.90; Magnesium 2.1;  Potassium 5.0; Sodium 136    Lipid Panel     Component Value Date/Time   CHOL 126 07/30/2016 1143   CHOL 93 (L) 06/16/2014 0914   TRIG 154 (H) 07/30/2016 1143   TRIG 124 06/16/2014 0914   HDL 45 07/30/2016 1143   HDL 34 (L) 06/16/2014 0914   CHOLHDL 2.8 07/30/2016 1143   CHOLHDL 2.7 01/19/2015 1005   VLDL 32 (H) 01/19/2015 1005   LDLCALC 50 07/30/2016 1143   LDLCALC 34 06/16/2014 0914     Wt Readings from Last 3 Encounters:  10/01/19  194 lb 6.4 oz (88.2 kg)  09/25/19 198 lb (89.8 kg)  09/23/19 198 lb 4.8 oz (89.9 kg)      Other studies Reviewed: Additional studies/ records that were reviewed today include: TTE 05/29/17 Review of the above records today demonstrates:  - Left ventricle: The cavity size was normal. Wall thickness was   increased in a pattern of mild LVH. Systolic function was   severely reduced. The estimated ejection fraction was in the   range of 20% to 25%. There is akinesis of the inferolateral,   inferior, and inferoseptal myocardium. Features are consistent   with a pseudonormal left ventricular filling pattern, with   concomitant abnormal relaxation and increased filling pressure   (grade 2 diastolic dysfunction). - Aortic valve: A pericardial tissue valve bioprosthesis was   present and functioning normally. Peak velocity (S): 226 cm/s.   Mean gradient (S): 10 mm Hg. Valve area (VTI): 1.52 cm^2. Valve   area (Vmax): 1.5 cm^2. Valve area (Vmean): 1.59 cm^2. - Mitral valve: Calcified annulus. Mildly thickened leaflets .   There was mild regurgitation. - Left atrium: The atrium was severely dilated. - Right ventricle: Pacer wire or catheter noted in right ventricle. - Pulmonary arteries: Systolic pressure was mildly increased. PA   peak pressure: 35 mm Hg (S).  RHC/LHC 05/31/17  Prox LAD to Mid LAD lesion is 50% stenosed.  Mid LAD to Dist LAD lesion is 25% stenosed.  1st Mrg lesion is 50% stenosed.  Prox RCA to Dist RCA lesion is 100%  stenosed.  And is large.  Prox Graft to Mid Graft lesion is 30% stenosed.  And is large.  The flow in the graft is reversed.  There is competitive flow.  And is large.  There is competitive flow.  Ost 1st Diag to 1st Diag lesion is 90% stenosed.   Findings:  Ao = 145/67 (99)  RA = 6 RV = 70/9 PA = 69/30 (46) PCW = 26 v = 37 Fick cardiac output/index =4.7/2.3 PVR = 4.3 FA sat = 98% PA sat = 63%, 60%  Assessment:  1. 3v CAD with stable revascularization with patent LIMA-LAD, SVG-diagonal and SVG-RCA 2. 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 Normal cardiac output - no evidence of thyrotoxicosis heart disease or PH   ASSESSMENT AND PLAN:  1.  Chronic combined systolic and diastolic congestive heart failure due to ischemic cardiomyopathy: Status post Medtronic CRT-D implanted 04/22/2015.  Device functioning appropriately.  Currently on optimal medical therapy with carvedilol, hydralazine, Imdur.  His OptiVol is elevated today and he is in atrial flutter.  I have told him to take his Lasix 40 mg on a daily basis instead of as needed.  We Deeana Atwater check a basic metabolic in 1 week.  2.  Persistent atrial fibrillation: On Eliquis with a CHA2DS2-VASc of 5.  Unfortunately he is in atrial flutter today.  We did rapid atrial pacing through his ICD at various cycle length which failed to convert him back to normal rhythm.  Because of his atrial arrhythmias, I do think that this is worsening his shortness of breath.  We are planning to diurese him.  I Jamesa Tedrick have him follow-up with one of the EP apps in couple weeks to discuss the possibility of cardioversion.  3.  Ventricular tachycardia: Currently on both sotalol and mexiletine.  We have performed monitoring today of these high risk medications.  Unfortunately does not have many other  medication options.  Based on his age and comorbidities, he is also not a candidate for ablation at  this time.  He Rivkah Wolz need to be get quite a bit stronger.  If he does go back into ventricular tachycardia or ventricular fibrillation and requires ICD therapy, it may be beneficial for him to see palliative care.  4.  Coronary artery disease status post CABG: No chest pain.  Case discussed with primary cardiology  Current medicines are reviewed at length with the patient today.   The patient does not have concerns regarding his medicines.  The following changes were made today: Increase Lasix  Labs/ tests ordered today include:  Orders Placed This Encounter  Procedures  . Basic metabolic panel  . CUP PACEART [place]  . EKG 12-Lead    Disposition:   FU with Cailee Blanke 3 months  Signed, all of Shanira Tine Meredith Leeds, MD  10/01/2019 1:09 PM     [facility] [address] [name] [address] [phone] (office) [phone] (fax)

## 2019-10-01 NOTE — Patient Instructions (Addendum)
Medication Instructions:  Your physician has recommended you make the following change in your medication:  1. TAKE Lasix DAILY for the next 7 days, then return to normal dosing.  *If you need a refill on your cardiac medications before your next appointment, please call your pharmacy*   Lab Work: Your physician recommends that you return for lab work in: one week for a BMET. If you have labs (blood work) drawn today and your tests are completely normal, you will receive your results only by: Marland Kitchen MyChart Message (if you have MyChart) OR . A paper copy in the mail If you have any lab test that is abnormal or we need to change your treatment, we will call you to review the results.   Testing/Procedures: None ordered   Follow-Up: Remote monitoring is used to monitor your Pacemaker of ICD from home. This monitoring reduces the number of office visits required to check your device to one time per year. It allows [place] to keep an eye on the functioning of your device to ensure it is working properly. You are scheduled for a device check from home on 11/17/2019. You may send your transmission at any time that day. If you have a wireless device, the transmission will be sent automatically. After your physician reviews your transmission, you will receive a postcard with your next transmission date.  At [facility], you and your health needs are our priority.  As part of our continuing mission to provide you with exceptional heart care, we have created designated Provider Care Teams.  These Care Teams include your primary Cardiologist (physician) and Advanced Practice Providers (APPs -  Physician Assistants and Nurse Practitioners) who all work together to provide you with the care you need, when you need it.  We recommend signing up for the patient portal called "MyChart".  Sign up information is provided on this After Visit Summary.  MyChart is used to connect with patients for Virtual Visits  (Telemedicine).  Patients are able to view lab/test results, encounter notes, upcoming appointments, etc.  Non-urgent messages can be sent to your provider as well.   To learn more about what you can do with MyChart, go to [email or web address].    Your next appointment:   2 week(s)  The format for your next appointment:   In Person  Provider:   You may see  one of the following Advanced Practice Providers on your designated Care Team:    Tommye Standard, [place]  Legrand Como "Jonni Sanger" Chalmers Cater, [place]   Your physician recommends that you schedule a follow-up appointment in: 3 months with Dr. Curt Bears.   Thank you for choosing [facility]!!   Trinidad Curet, RN [phone]    Other Instructions

## 2019-10-01 NOTE — Telephone Encounter (Signed)
Spoke with the patient's wife who states that the patient is in rehab. I advised her that Dr. Radford Pax would like to see him in 4 weeks. She said they would call back to set this up.

## 2019-10-01 NOTE — Telephone Encounter (Signed)
-----  Message from Sueanne Margarita, MD sent at 10/01/2019  1:14 PM EDT ----- Please set patient up to see me in office in 4 weeks  Traci ----- Message ----- From: Constance Haw, MD Sent: 10/01/2019   1:07 PM EDT To: Sueanne Margarita, MD  I did not, planning on seeing him back in a few months and didn't think that today was the day to have the conversation. If you want to have it that sounds reasonable. He needs diuresis and likely cardioversion and is following up with an EP APP in 2 weeks. ----- Message ----- From: Sueanne Margarita, MD Sent: 10/01/2019  12:51 PM EDT To: Constance Haw, MD  Did you mention palliative care with him>>if not I can bring him in to discuss  Traci ----- Message ----- From: Constance Haw, MD Sent: 10/01/2019  12:30 PM EDT To: Sueanne Margarita, MD  Traci, I am not sure if you seen but Mr. Barco had a very prolonged admission for diverticulitis.  He had quite a bit of ventricular tachycardia and we adjusted some of his medications.  He came back into clinic with more VT and was paced out of it here in clinic.  He has some heart failure and we are working on diuresis.  He is also in atrial flutter and may need to cardiovert him.  I do think that he is nearing benefit from palliative care.  Just wanted you in the loop.

## 2019-10-05 DIAGNOSIS — L22 Diaper dermatitis: Secondary | ICD-10-CM | POA: Diagnosis not present

## 2019-10-05 DIAGNOSIS — R05 Cough: Secondary | ICD-10-CM | POA: Diagnosis not present

## 2019-10-05 DIAGNOSIS — R0989 Other specified symptoms and signs involving the circulatory and respiratory systems: Secondary | ICD-10-CM | POA: Diagnosis not present

## 2019-10-05 DIAGNOSIS — J189 Pneumonia, unspecified organism: Secondary | ICD-10-CM | POA: Diagnosis not present

## 2019-10-07 ENCOUNTER — Other Ambulatory Visit: Payer: Self-pay

## 2019-10-07 ENCOUNTER — Other Ambulatory Visit: Payer: Medicare HMO | Admitting: *Deleted

## 2019-10-07 DIAGNOSIS — I480 Paroxysmal atrial fibrillation: Secondary | ICD-10-CM

## 2019-10-07 DIAGNOSIS — Z79899 Other long term (current) drug therapy: Secondary | ICD-10-CM

## 2019-10-07 LAB — BASIC METABOLIC PANEL
BUN/Creatinine Ratio: 13 (ref 10–24)
BUN: 25 mg/dL (ref 8–27)
CO2: 20 mmol/L (ref 20–29)
Calcium: 9 mg/dL (ref 8.6–10.2)
Chloride: 98 mmol/L (ref 96–106)
Creatinine, Ser: 1.88 mg/dL — ABNORMAL HIGH (ref 0.76–1.27)
GFR calc Af Amer: 37 mL/min/{1.73_m2} — ABNORMAL LOW (ref >59)
GFR calc non Af Amer: 32 mL/min/{1.73_m2} — ABNORMAL LOW (ref >59)
Glucose: 139 mg/dL — ABNORMAL HIGH (ref 65–99)
Potassium: 4.5 mmol/L (ref 3.5–5.2)
Sodium: 136 mmol/L (ref 134–144)

## 2019-10-08 DIAGNOSIS — M6281 Muscle weakness (generalized): Secondary | ICD-10-CM | POA: Diagnosis not present

## 2019-10-08 DIAGNOSIS — R5381 Other malaise: Secondary | ICD-10-CM | POA: Diagnosis not present

## 2019-10-08 DIAGNOSIS — R262 Difficulty in walking, not elsewhere classified: Secondary | ICD-10-CM | POA: Diagnosis not present

## 2019-10-08 DIAGNOSIS — M25551 Pain in right hip: Secondary | ICD-10-CM | POA: Diagnosis not present

## 2019-10-13 ENCOUNTER — Other Ambulatory Visit: Payer: Self-pay | Admitting: Cardiology

## 2019-10-13 DIAGNOSIS — I129 Hypertensive chronic kidney disease with stage 1 through stage 4 chronic kidney disease, or unspecified chronic kidney disease: Secondary | ICD-10-CM | POA: Diagnosis not present

## 2019-10-13 DIAGNOSIS — E1122 Type 2 diabetes mellitus with diabetic chronic kidney disease: Secondary | ICD-10-CM | POA: Diagnosis not present

## 2019-10-13 DIAGNOSIS — N1832 Chronic kidney disease, stage 3b: Secondary | ICD-10-CM | POA: Diagnosis not present

## 2019-10-13 DIAGNOSIS — E875 Hyperkalemia: Secondary | ICD-10-CM | POA: Diagnosis not present

## 2019-10-13 DIAGNOSIS — I5022 Chronic systolic (congestive) heart failure: Secondary | ICD-10-CM | POA: Diagnosis not present

## 2019-10-13 DIAGNOSIS — K2270 Barrett's esophagus without dysplasia: Secondary | ICD-10-CM | POA: Diagnosis not present

## 2019-10-15 MED ORDER — FUROSEMIDE 40 MG PO TABS: 40.0000 mg | ORAL_TABLET | Freq: Every day | ORAL | 11 refills | Status: DC | PRN

## 2019-10-16 DIAGNOSIS — N184 Chronic kidney disease, stage 4 (severe): Secondary | ICD-10-CM | POA: Diagnosis not present

## 2019-10-16 DIAGNOSIS — I10 Essential (primary) hypertension: Secondary | ICD-10-CM | POA: Diagnosis not present

## 2019-10-16 DIAGNOSIS — I5042 Chronic combined systolic (congestive) and diastolic (congestive) heart failure: Secondary | ICD-10-CM | POA: Diagnosis not present

## 2019-10-16 DIAGNOSIS — K219 Gastro-esophageal reflux disease without esophagitis: Secondary | ICD-10-CM | POA: Diagnosis not present

## 2019-10-16 DIAGNOSIS — I4819 Other persistent atrial fibrillation: Secondary | ICD-10-CM | POA: Diagnosis not present

## 2019-10-16 DIAGNOSIS — K5792 Diverticulitis of intestine, part unspecified, without perforation or abscess without bleeding: Secondary | ICD-10-CM | POA: Diagnosis not present

## 2019-10-16 DIAGNOSIS — E785 Hyperlipidemia, unspecified: Secondary | ICD-10-CM | POA: Diagnosis not present

## 2019-10-16 DIAGNOSIS — M6281 Muscle weakness (generalized): Secondary | ICD-10-CM | POA: Diagnosis not present

## 2019-10-16 DIAGNOSIS — E43 Unspecified severe protein-calorie malnutrition: Secondary | ICD-10-CM | POA: Diagnosis not present

## 2019-10-20 NOTE — H&P (View-Only) (Signed)
Electrophysiology Office Note Date: 10/21/2019  ID:  [name], DOB Mar 03, 1933, MRN [id number]  PCP: [name], MD Primary Cardiologist: [name], MD Electrophysiologist: Constance Haw, MD   CC: Routine ICD follow-up  [name] is a 86 y.o. male seen today for [name] Meredith Leeds, MD for routine electrophysiology followup. He has a history of hypertension, aortic stenosis status post AVR, chronic combined systolic and diastolic CHF in the setting of A. fib with RVR, orthostatic hypotension, dilated cardiomyopathy EF 25-30%, status post CABG, and paroxysmal atrial fibrillation.  He does have paroxysmal atrial fibrillation has been cardioverted in the past and is now on a fixed within with rhythm control via amiodarone. He had CRT-D upgrade on 04/22/15.  He was having VT below his detection limit.  Device limits were changed in the last visit and he was put on mexiletine.  He was recently hospitalized for systolic heart failure with aggressive diuresis and thoracentesis.  He had a recent prolonged admission for diverticulitis and was noted to have nonsustained VT on telemetry.  His sotalol dose was increased.    After discharge, he was noted to have a prolonged episodes of ventricular tachycardia below ICD detection.  His rate eventually accelerated and was terminated by high-voltage therapy.  He presented to cardiology clinic in a double tachycardia with atrial flutter and nonsustained VT.  VT was terminated with pacing in clinic.  His sotalol was increased to 80 mg twice daily as it was during his admission and his mexiletine was continued.  He has been intolerant of amiodarone.  He is overall feeling well today. He has been home from rehab now for several days. Feels like he is gradually improving and getting stronger. Remains in AFL be device interrogation. He is open to DCCV. He denies missing any of his Eliquis for > 1 month. He has been taking lasix 40 mg daily, for  at least the past couple of months.   Device History: Medtronic BiV ICD implanted 04/2015 for ICM and NYHA II CHF with LBBB History of appropriate therapy: Yes History of AAD therapy: Yes; currently on sotalol and mexiletine  Past Medical History:  Diagnosis Date  . Arthritis    "minor everywhere" (04/22/2015)  . Asthma    "a touch"  . Barrett esophagus   . CAD (coronary artery disease)    a. s/p 3 vvessel CABG with LIMA to LAD, SVG to diagonal 1, SVG to RCA 12/09.  . Cardiac resynchronization therapy defibrillator (CRT-D) in place   . Chronic anticoagulation 01/23/2013  . Chronic combined systolic and diastolic CHF (congestive heart failure) (HCC)    dry weight 197-200lbs.  . CKD (chronic kidney disease) stage 3, GFR 30-59 ml/min 08/23/2014  . DCM (dilated cardiomyopathy) ([place])    initially concerned for TTP amyloidosis but Tc-PYP scan 06/2017 was not consistent with amyloid and SPEP/UPEP with no M spike. EF 20-25% by echo 2019  . Diverticulitis 08/2019  . Dyslipidemia 01/23/2013  . Essential hypertension 01/23/2013  . GERD (gastroesophageal reflux disease)   . GI bleed 05/08/2017  . H/O: rheumatic fever   . Hepatitis 1957   "don't know what kind:  . History of hiatal hernia   . History of PFTs    a. Amiodarone started 5/16 >> PFTs w/ DLCO 6/16:  FEV1 72% predicted, FEV1/FVC 66%, DLCO 66% >> minimal reversible obstructive airways disease with mild diffusion defect (suggestive of emphysema but absence of hyperinflation inconsistent with dx)  . Hyperkalemia 08/23/2014  .  Hyperthyroidism 05/26/2017  . Leg weakness 01/28/2017  . Multiple thyroid nodules 08/13/2017  . Orthostatic hypotension   . Persistent atrial fibrillation ([place]) 01/23/2013  . Pleural effusion on right 05/26/2017  . Pneumonia 08/2014   "dr thought I may have had a touch"  . Pre-diabetes   . Protein-calorie malnutrition, severe 05/27/2017  . S/P AVR (aortic valve replacement) 2009   a. severe AS s/p AVR with  pericardial tissue valve 2009.  Marland Kitchen Shingles 08/23/2014  . VT (ventricular tachycardia) ([place]) 05/06/2017   Past Surgical History:  Procedure Laterality Date  . AORTIC VALVE REPLACEMENT  with 23-mm Magna Ease pericardial valve, model number   with 23-mm Magna Ease pericardial valve, model number3300TFX, serial number [id number]   . APPENDECTOMY  1940s  . BI-VENTRICULAR IMPLANTABLE CARDIOVERTER DEFIBRILLATOR  (CRT-D)  04/22/2015  . CARDIAC CATHETERIZATION N/A 11/04/2014   Procedure: Left Heart Cath and Cors/Grafts Angiography;  Surgeon: Leonie Man, MD;  Location: [facility];  Service: Cardiovascular;  Laterality: N/A;  . CARDIAC VALVE REPLACEMENT    . CARDIOVERSION N/A 01/27/2013   Procedure: CARDIOVERSION;  Surgeon: Sueanne Margarita, MD;  Location: [facility];  Service: Cardiovascular;  Laterality: N/A;  . CARDIOVERSION N/A 08/25/2014   Procedure: CARDIOVERSION;  Surgeon: Sanda Klein, MD;  Location: [facility];  Service: Cardiovascular;  Laterality: N/A;  . CARDIOVERSION N/A 05/28/2018   Procedure: CARDIOVERSION;  Surgeon: Pixie Casino, MD;  Location: [facility];  Service: Cardiovascular;  Laterality: N/A;  . CATARACT EXTRACTION W/ INTRAOCULAR LENS  IMPLANT, BILATERAL Bilateral   . CHEST TUBE INSERTION Right 07/26/2017   Procedure: INSERTION PLEURAL DRAINAGE CATHETER - RIGHT;  Surgeon: Ivin Poot, MD;  Location: [facility];  Service: Thoracic;  Laterality: Right;  . CORONARY ARTERY BYPASS GRAFT  03/10/2008   x 3 Dr. Roxan Hockey  . EP IMPLANTABLE DEVICE N/A 04/22/2015   Procedure: BiV ICD Insertion CRT-D;  Surgeon: [name] Meredith Leeds, MD;  Location: [facility];  Service: Cardiovascular;  Laterality: N/A;  . ESOPHAGOGASTRODUODENOSCOPY (EGD) WITH ESOPHAGEAL DILATION  X1  . ESOPHAGOGASTRODUODENOSCOPY (EGD) WITH PROPOFOL N/A 05/09/2017   Procedure: ESOPHAGOGASTRODUODENOSCOPY (EGD) WITH PROPOFOL;  Surgeon: Carol Ada, MD;  Location: [facility];  Service: Endoscopy;   Laterality: N/A;  . IR THORACENTESIS ASP PLEURAL SPACE W/IMG GUIDE  05/27/2017  . IR THORACENTESIS ASP PLEURAL SPACE W/IMG GUIDE  06/17/2017  . IR THORACENTESIS ASP PLEURAL SPACE W/IMG GUIDE  06/18/2017  . IR THORACENTESIS ASP PLEURAL SPACE W/IMG GUIDE  07/11/2017  . REMOVAL OF PLEURAL DRAINAGE CATHETER Right 09/13/2017   Procedure: REMOVAL OF PLEURAL DRAINAGE CATHETER;  Surgeon: Ivin Poot, MD;  Location: [facility];  Service: Thoracic;  Laterality: Right;  . RIGHT HEART CATH AND CORONARY/GRAFT ANGIOGRAPHY N/A 05/31/2017   Procedure: RIGHT HEART CATH AND CORONARY/GRAFT ANGIOGRAPHY;  Surgeon: Jolaine Artist, MD;  Location: [place] [facility];  Service: Cardiovascular;  Laterality: N/A;  . TONSILLECTOMY      Current Outpatient Medications  Medication Sig Dispense Refill  . atorvastatin (LIPITOR) 20 MG tablet TAKE 1 TABLET EVERY DAY  AT  6  PM 90 tablet 2  . carvedilol (COREG) 25 MG tablet TAKE 1 TABLET TWICE DAILY (DOSE INCREASE) 180 tablet 3  . ELIQUIS 2.5 MG TABS tablet TAKE 1 TABLET TWICE DAILY 180 tablet 1  . famotidine (PEPCID) 40 MG tablet Take 40 mg by mouth every evening.     . furosemide (LASIX) 40 MG tablet Take 1 tablet (40 mg total) by mouth daily  as needed for edema. 30 tablet 11  . hydrALAZINE (APRESOLINE) 25 MG tablet     . isosorbide mononitrate (IMDUR) 30 MG 24 hr tablet TAKE 1 TABLET EVERY DAY 90 tablet 2  . mexiletine (MEXITIL) 150 MG capsule Take 2 capsules (300 mg total) by mouth every 12 (twelve) hours.    . Multiple Vitamin (MULTIVITAMIN WITH MINERALS) TABS tablet Take 1 tablet by mouth daily.     . Omega-3 Fatty Acids (FISH OIL) 1000 MG CAPS Take 2,000 mg by mouth 2 (two) times daily.    . sotalol (BETAPACE) 80 MG tablet Take 1 tablet (80 mg total) by mouth 2 (two) times daily. 180 tablet 3   No current facility-administered medications for this visit.    Allergies:   Novocain [procaine] and Amiodarone   Social History: Social History   Socioeconomic History  .  Marital status: Married    Spouse name: Not on file  . Number of children: Not on file  . Years of education: Not on file  . Highest education level: Not on file  Occupational History  . Not on file  Tobacco Use  . Smoking status: Never Smoker  . Smokeless tobacco: Never Used  Vaping Use  . Vaping Use: Never used  Substance and Sexual Activity  . Alcohol use: Not Currently    Comment: 04/22/2015 "nothing since the 1980s; never had a problem w/it"  . Drug use: No  . Sexual activity: Not Currently  Other Topics Concern  . Not on file  Social History Narrative   Married, lives with spouse [name]   4 children, 2 boys and 2 girls > one daughter passed   OCCUPATION: retired, Pension scheme manager for [employer] Strain:   . Difficulty of Paying Living Expenses:   Food Insecurity:   . Worried About Charity fundraiser in the Last Year:   . Arboriculturist in the Last Year:   Transportation Needs:   . Film/video editor (Medical):   Marland Kitchen Lack of Transportation (Non-Medical):   Physical Activity:   . Days of Exercise per Week:   . Minutes of Exercise per Session:   Stress:   . Feeling of Stress :   Social Connections:   . Frequency of Communication with Friends and Family:   . Frequency of Social Gatherings with Friends and Family:   . Attends Religious Services:   . Active Member of Clubs or Organizations:   . Attends Archivist Meetings:   Marland Kitchen Marital Status:   Intimate Partner Violence:   . Fear of Current or Ex-Partner:   . Emotionally Abused:   Marland Kitchen Physically Abused:   . Sexually Abused:     Family History: Family History  Problem Relation Age of Onset  . Alzheimer's disease Mother   . COPD Daughter   . Alzheimer's disease Other   . COPD Sister   . Depression Sister   . Heart disease Sister   . Hyperlipidemia Sister   . Hypertension Sister   . Esophageal cancer Sister        + smoker     Review of Systems: All other systems reviewed and are otherwise negative except as noted above.   Physical Exam: Vitals:   10/21/19 0941  BP: (!) 96/54  Pulse: 95  SpO2: 95%  Weight: 188 lb 6.4 oz (85.5 kg)  Height: 6' (1.829 m)     GEN- The patient is  well appearing, alert and oriented x 3 today.   HEENT: normocephalic, atraumatic; sclera clear, conjunctiva pink; hearing intact; oropharynx clear; neck supple, no JVP Lymph- no cervical lymphadenopathy Lungs- Clear to ausculation bilaterally, normal work of breathing.  No wheezes, rales, rhonchi Heart- Regular rate and rhythm, no murmurs, rubs or gallops, PMI not laterally displaced GI- soft, non-tender, non-distended, bowel sounds present, no hepatosplenomegaly Extremities- no clubbing, cyanosis, or edema; DP/PT/radial pulses 2+ bilaterally MS- no significant deformity or atrophy Skin- warm and dry, no rash or lesion; ICD pocket well healed Psych- euthymic mood, full affect Neuro- strength and sensation are intact  ICD interrogation- reviewed in detail today,  See PACEART report  EKG:  EKG is not ordered today.  Recent Labs: 11/14/2018: B Natriuretic Peptide 313.2 09/08/2019: ALT 25 09/23/2019: Hemoglobin 10.4; Platelets 283 09/25/2019: Magnesium 2.1 10/07/2019: BUN 25; Creatinine, Ser 1.88; Potassium 4.5; Sodium 136   Wt Readings from Last 3 Encounters:  10/21/19 188 lb 6.4 oz (85.5 kg)  10/01/19 194 lb 6.4 oz (88.2 kg)  09/25/19 198 lb (89.8 kg)     Other studies Reviewed: Additional studies/ records that were reviewed today include: Previous EP office notes, recent admission notes, previous remotes, most recent labs   Assessment and Plan:  1.  Chronic combined systolic and diastolic dysfunction s/p Medtronic CRT-D  Currenlty on an appropriate medical regimen coreg, hydralazine and imdur. Fluid has fluctuated in atrial flutter Normal ICD function See P[facility] Art report No changes today With soft pressures and  issues lately, would likely be beneficial to follow up in HF clinic, especially if we can't get him back in NSR or he has further VT, as he has no other medical options (amiodarone intolerance -> N/V)  2.  Persistent atrial fibrillation/flutter Remains on Eliquis with a CHA2DS2-VASc of 5.  This has not been amenable to ANIPS.  Continue sotalol.  [name] plan cardioversion next week. He has not missed any eliquis.   3.  Ventricular tachycardia:  Remains on both sotalol and mexiletine, with no other options due to intolerance of amiodarone.  He is not a candidate for VT ablation.   4.  Coronary artery disease status post CABG:  Denies ischemic symptoms.   Current medicines are reviewed at length with the patient today.   The patient does not have concerns regarding his medicines.  The following changes were made today:  none  Labs/ tests ordered today include:  Orders Placed This Encounter  Procedures  . Basic Metabolic Panel (BMET)  . CBC w/Diff  . Magnesium   Disposition:   Follow up with EP APP 2-3 weeks post DCCV. Overdue CHF clinic follow up  Signed, Annamaria Helling  10/21/2019 10:49 AM  [facility] [address] [place] [address] [phone] (office) [phone] (fax)

## 2019-10-20 NOTE — Progress Notes (Signed)
Electrophysiology Office Note Date: 10/21/2019  ID:  WEBSTER PATRONE, DOB Mar 03, 1933, MRN [id number]  PCP: [name], MD Primary Cardiologist: [name], MD Electrophysiologist: Constance Haw, MD   CC: Routine ICD follow-up  JAIKOB BORGWARDT is a 86 y.o. male seen today for [name], MD for routine electrophysiology followup. He has a history of hypertension, aortic stenosis status post AVR, chronic combined systolic and diastolic CHF in the setting of A. fib with RVR, orthostatic hypotension, dilated cardiomyopathy EF 25-30%, status post CABG, and paroxysmal atrial fibrillation.  He does have paroxysmal atrial fibrillation has been cardioverted in the past and is now on a fixed within with rhythm control via amiodarone. He had CRT-D upgrade on 04/22/15.  He was having VT below his detection limit.  Device limits were changed in the last visit and he was put on mexiletine.  He was recently hospitalized for systolic heart failure with aggressive diuresis and thoracentesis.  He had a recent prolonged admission for diverticulitis and was noted to have nonsustained VT on telemetry.  His sotalol dose was increased.    After discharge, he was noted to have a prolonged episodes of ventricular tachycardia below ICD detection.  His rate eventually accelerated and was terminated by high-voltage therapy.  He presented to cardiology clinic in a double tachycardia with atrial flutter and nonsustained VT.  VT was terminated with pacing in clinic.  His sotalol was increased to 80 mg twice daily as it was during his admission and his mexiletine was continued.  He has been intolerant of amiodarone.  He is overall feeling well today. He has been home from rehab now for several days. Feels like he is gradually improving and getting stronger. Remains in AFL be device interrogation. He is open to DCCV. He denies missing any of his Eliquis for > 1 month. He has been taking lasix 40 mg daily, for  at least the past couple of months.   Device History: Medtronic BiV ICD implanted 04/2015 for ICM and NYHA II CHF with LBBB History of appropriate therapy: Yes History of AAD therapy: Yes; currently on sotalol and mexiletine  Past Medical History:  Diagnosis Date  . Arthritis    "minor everywhere" (04/22/2015)  . Asthma    "a touch"  . Barrett esophagus   . CAD (coronary artery disease)    a. s/p 3 vvessel CABG with LIMA to LAD, SVG to diagonal 1, SVG to RCA 12/09.  . Cardiac resynchronization therapy defibrillator (CRT-D) in place   . Chronic anticoagulation 01/23/2013  . Chronic combined systolic and diastolic CHF (congestive heart failure) (HCC)    dry weight 197-200lbs.  . CKD (chronic kidney disease) stage 3, GFR 30-59 ml/min 08/23/2014  . DCM (dilated cardiomyopathy) ([place])    initially concerned for TTP amyloidosis but Tc-PYP scan 06/2017 was not consistent with amyloid and SPEP/UPEP with no M spike. EF 20-25% by echo 2019  . Diverticulitis 08/2019  . Dyslipidemia 01/23/2013  . Essential hypertension 01/23/2013  . GERD (gastroesophageal reflux disease)   . GI bleed 05/08/2017  . H/O: rheumatic fever   . Hepatitis 1957   "don't know what kind:  . History of hiatal hernia   . History of PFTs    a. Amiodarone started 5/16 >> PFTs w/ DLCO 6/16:  FEV1 72% predicted, FEV1/FVC 66%, DLCO 66% >> minimal reversible obstructive airways disease with mild diffusion defect (suggestive of emphysema but absence of hyperinflation inconsistent with dx)  . Hyperkalemia 08/23/2014  .  Hyperthyroidism 05/26/2017  . Leg weakness 01/28/2017  . Multiple thyroid nodules 08/13/2017  . Orthostatic hypotension   . Persistent atrial fibrillation ([place]) 01/23/2013  . Pleural effusion on right 05/26/2017  . Pneumonia 08/2014   "dr thought I may have had a touch"  . Pre-diabetes   . Protein-calorie malnutrition, severe 05/27/2017  . S/P AVR (aortic valve replacement) 2009   a. severe AS s/p AVR with  pericardial tissue valve 2009.  Marland Kitchen Shingles 08/23/2014  . VT (ventricular tachycardia) ([place]) 05/06/2017   Past Surgical History:  Procedure Laterality Date  . AORTIC VALVE REPLACEMENT  with 23-mm Magna Ease pericardial valve, model number   with 23-mm Magna Ease pericardial valve, model number3300TFX, serial number [id number]   . APPENDECTOMY  1940s  . BI-VENTRICULAR IMPLANTABLE CARDIOVERTER DEFIBRILLATOR  (CRT-D)  04/22/2015  . CARDIAC CATHETERIZATION N/A 11/04/2014   Procedure: Left Heart Cath and Cors/Grafts Angiography;  Surgeon: Leonie Man, MD;  Location: [facility];  Service: Cardiovascular;  Laterality: N/A;  . CARDIAC VALVE REPLACEMENT    . CARDIOVERSION N/A 01/27/2013   Procedure: CARDIOVERSION;  Surgeon: Sueanne Margarita, MD;  Location: [facility];  Service: Cardiovascular;  Laterality: N/A;  . CARDIOVERSION N/A 08/25/2014   Procedure: CARDIOVERSION;  Surgeon: Sanda Klein, MD;  Location: [facility];  Service: Cardiovascular;  Laterality: N/A;  . CARDIOVERSION N/A 05/28/2018   Procedure: CARDIOVERSION;  Surgeon: Pixie Casino, MD;  Location: [facility];  Service: Cardiovascular;  Laterality: N/A;  . CATARACT EXTRACTION W/ INTRAOCULAR LENS  IMPLANT, BILATERAL Bilateral   . CHEST TUBE INSERTION Right 07/26/2017   Procedure: INSERTION PLEURAL DRAINAGE CATHETER - RIGHT;  Surgeon: Ivin Poot, MD;  Location: [facility];  Service: Thoracic;  Laterality: Right;  . CORONARY ARTERY BYPASS GRAFT  03/10/2008   x 3 Dr. Roxan Hockey  . EP IMPLANTABLE DEVICE N/A 04/22/2015   Procedure: BiV ICD Insertion CRT-D;  Surgeon: [name], MD;  Location: [facility];  Service: Cardiovascular;  Laterality: N/A;  . ESOPHAGOGASTRODUODENOSCOPY (EGD) WITH ESOPHAGEAL DILATION  X1  . ESOPHAGOGASTRODUODENOSCOPY (EGD) WITH PROPOFOL N/A 05/09/2017   Procedure: ESOPHAGOGASTRODUODENOSCOPY (EGD) WITH PROPOFOL;  Surgeon: Carol Ada, MD;  Location: [facility];  Service: Endoscopy;   Laterality: N/A;  . IR THORACENTESIS ASP PLEURAL SPACE W/IMG GUIDE  05/27/2017  . IR THORACENTESIS ASP PLEURAL SPACE W/IMG GUIDE  06/17/2017  . IR THORACENTESIS ASP PLEURAL SPACE W/IMG GUIDE  06/18/2017  . IR THORACENTESIS ASP PLEURAL SPACE W/IMG GUIDE  07/11/2017  . REMOVAL OF PLEURAL DRAINAGE CATHETER Right 09/13/2017   Procedure: REMOVAL OF PLEURAL DRAINAGE CATHETER;  Surgeon: Ivin Poot, MD;  Location: [facility];  Service: Thoracic;  Laterality: Right;  . RIGHT HEART CATH AND CORONARY/GRAFT ANGIOGRAPHY N/A 05/31/2017   Procedure: RIGHT HEART CATH AND CORONARY/GRAFT ANGIOGRAPHY;  Surgeon: Jolaine Artist, MD;  Location: [facility];  Service: Cardiovascular;  Laterality: N/A;  . TONSILLECTOMY      Current Outpatient Medications  Medication Sig Dispense Refill  . atorvastatin (LIPITOR) 20 MG tablet TAKE 1 TABLET EVERY DAY  AT  6  PM 90 tablet 2  . carvedilol (COREG) 25 MG tablet TAKE 1 TABLET TWICE DAILY (DOSE INCREASE) 180 tablet 3  . ELIQUIS 2.5 MG TABS tablet TAKE 1 TABLET TWICE DAILY 180 tablet 1  . famotidine (PEPCID) 40 MG tablet Take 40 mg by mouth every evening.     . furosemide (LASIX) 40 MG tablet Take 1 tablet (40 mg total) by mouth daily  as needed for edema. 30 tablet 11  . hydrALAZINE (APRESOLINE) 25 MG tablet     . isosorbide mononitrate (IMDUR) 30 MG 24 hr tablet TAKE 1 TABLET EVERY DAY 90 tablet 2  . mexiletine (MEXITIL) 150 MG capsule Take 2 capsules (300 mg total) by mouth every 12 (twelve) hours.    . Multiple Vitamin (MULTIVITAMIN WITH MINERALS) TABS tablet Take 1 tablet by mouth daily.     . Omega-3 Fatty Acids (FISH OIL) 1000 MG CAPS Take 2,000 mg by mouth 2 (two) times daily.    . sotalol (BETAPACE) 80 MG tablet Take 1 tablet (80 mg total) by mouth 2 (two) times daily. 180 tablet 3   No current facility-administered medications for this visit.    Allergies:   Novocain [procaine] and Amiodarone   Social History: Social History   Socioeconomic History  .  Marital status: Married    Spouse name: Not on file  . Number of children: Not on file  . Years of education: Not on file  . Highest education level: Not on file  Occupational History  . Not on file  Tobacco Use  . Smoking status: Never Smoker  . Smokeless tobacco: Never Used  Vaping Use  . Vaping Use: Never used  Substance and Sexual Activity  . Alcohol use: Not Currently    Comment: 04/22/2015 "nothing since the 1980s; never had a problem w/it"  . Drug use: No  . Sexual activity: Not Currently  Other Topics Concern  . Not on file  Social History Narrative   Married, lives with spouse [name]   4 children, 2 boys and 2 girls > one daughter passed   OCCUPATION: retired, Pension scheme manager for [employer] Strain:   . Difficulty of Paying Living Expenses:   Food Insecurity:   . Worried About Charity fundraiser in the Last Year:   . Arboriculturist in the Last Year:   Transportation Needs:   . Film/video editor (Medical):   Marland Kitchen Lack of Transportation (Non-Medical):   Physical Activity:   . Days of Exercise per Week:   . Minutes of Exercise per Session:   Stress:   . Feeling of Stress :   Social Connections:   . Frequency of Communication with Friends and Family:   . Frequency of Social Gatherings with Friends and Family:   . Attends Religious Services:   . Active Member of Clubs or Organizations:   . Attends Archivist Meetings:   Marland Kitchen Marital Status:   Intimate Partner Violence:   . Fear of Current or Ex-Partner:   . Emotionally Abused:   Marland Kitchen Physically Abused:   . Sexually Abused:     Family History: Family History  Problem Relation Age of Onset  . Alzheimer's disease Mother   . COPD Daughter   . Alzheimer's disease Other   . COPD Sister   . Depression Sister   . Heart disease Sister   . Hyperlipidemia Sister   . Hypertension Sister   . Esophageal cancer Sister        + smoker     Review of Systems: All other systems reviewed and are otherwise negative except as noted above.   Physical Exam: Vitals:   10/21/19 0941  BP: (!) 96/54  Pulse: 95  SpO2: 95%  Weight: 188 lb 6.4 oz (85.5 kg)  Height: 6' (1.829 m)     GEN- The patient is  well appearing, alert and oriented x 3 today.   HEENT: normocephalic, atraumatic; sclera clear, conjunctiva pink; hearing intact; oropharynx clear; neck supple, no JVP Lymph- no cervical lymphadenopathy Lungs- Clear to ausculation bilaterally, normal work of breathing.  No wheezes, rales, rhonchi Heart- Regular rate and rhythm, no murmurs, rubs or gallops, PMI not laterally displaced GI- soft, non-tender, non-distended, bowel sounds present, no hepatosplenomegaly Extremities- no clubbing, cyanosis, or edema; DP/PT/radial pulses 2+ bilaterally MS- no significant deformity or atrophy Skin- warm and dry, no rash or lesion; ICD pocket well healed Psych- euthymic mood, full affect Neuro- strength and sensation are intact  ICD interrogation- reviewed in detail today,  See PACEART report  EKG:  EKG is not ordered today.  Recent Labs: 11/14/2018: B Natriuretic Peptide 313.2 09/08/2019: ALT 25 09/23/2019: Hemoglobin 10.4; Platelets 283 09/25/2019: Magnesium 2.1 10/07/2019: BUN 25; Creatinine, Ser 1.88; Potassium 4.5; Sodium 136   Wt Readings from Last 3 Encounters:  10/21/19 188 lb 6.4 oz (85.5 kg)  10/01/19 194 lb 6.4 oz (88.2 kg)  09/25/19 198 lb (89.8 kg)     Other studies Reviewed: Additional studies/ records that were reviewed today include: Previous EP office notes, recent admission notes, previous remotes, most recent labs   Assessment and Plan:  1.  Chronic combined systolic and diastolic dysfunction s/p Medtronic CRT-D  Currenlty on an appropriate medical regimen coreg, hydralazine and imdur. Fluid has fluctuated in atrial flutter Normal ICD function See Pace Art report No changes today With soft pressures and  issues lately, would likely be beneficial to follow up in HF clinic, especially if we can't get him back in NSR or he has further VT, as he has no other medical options (amiodarone intolerance -> N/V)  2.  Persistent atrial fibrillation/flutter Remains on Eliquis with a CHA2DS2-VASc of 5.  This has not been amenable to ANIPS.  Continue sotalol.  Will plan cardioversion next week. He has not missed any eliquis.   3.  Ventricular tachycardia:  Remains on both sotalol and mexiletine, with no other options due to intolerance of amiodarone.  He is not a candidate for VT ablation.   4.  Coronary artery disease status post CABG:  Denies ischemic symptoms.   Current medicines are reviewed at length with the patient today.   The patient does not have concerns regarding his medicines.  The following changes were made today:  none  Labs/ tests ordered today include:  Orders Placed This Encounter  Procedures  . Basic Metabolic Panel (BMET)  . CBC w/Diff  . Magnesium   Disposition:   Follow up with EP APP 2-3 weeks post DCCV. Overdue CHF clinic follow up  Signed, Annamaria Helling  10/21/2019 10:49 AM  [facility] [address] [place] [address] [phone] (office) [phone] (fax)

## 2019-10-21 ENCOUNTER — Other Ambulatory Visit: Payer: Self-pay

## 2019-10-21 ENCOUNTER — Ambulatory Visit: Payer: Medicare HMO | Admitting: Student

## 2019-10-21 ENCOUNTER — Encounter: Payer: Self-pay | Admitting: Student

## 2019-10-21 VITALS — BP 96/54 | HR 95 | Ht 72.0 in | Wt 188.4 lb

## 2019-10-21 DIAGNOSIS — I5042 Chronic combined systolic (congestive) and diastolic (congestive) heart failure: Secondary | ICD-10-CM

## 2019-10-21 DIAGNOSIS — I472 Ventricular tachycardia, unspecified: Secondary | ICD-10-CM

## 2019-10-21 DIAGNOSIS — I480 Paroxysmal atrial fibrillation: Secondary | ICD-10-CM

## 2019-10-21 DIAGNOSIS — Z01812 Encounter for preprocedural laboratory examination: Secondary | ICD-10-CM

## 2019-10-21 DIAGNOSIS — Z79899 Other long term (current) drug therapy: Secondary | ICD-10-CM | POA: Diagnosis not present

## 2019-10-21 LAB — CBC WITH DIFFERENTIAL/PLATELET
Basophils Absolute: 0 10*3/uL (ref 0.0–0.2)
Basos: 0 %
EOS (ABSOLUTE): 0.2 10*3/uL (ref 0.0–0.4)
Eos: 2 %
Hematocrit: 37.6 % (ref 37.5–51.0)
Hemoglobin: 12.5 g/dL — ABNORMAL LOW (ref 13.0–17.7)
Lymphocytes Absolute: 2.7 10*3/uL (ref 0.7–3.1)
Lymphs: 30 %
MCH: 30.1 pg (ref 26.6–33.0)
MCHC: 33.2 g/dL (ref 31.5–35.7)
MCV: 91 fL (ref 79–97)
Monocytes Absolute: 1.5 10*3/uL — ABNORMAL HIGH (ref 0.1–0.9)
Monocytes: 17 %
Neutrophils Absolute: 4.5 10*3/uL (ref 1.4–7.0)
Neutrophils: 51 %
Platelets: 307 10*3/uL (ref 150–450)
RBC: 4.15 x10E6/uL (ref 4.14–5.80)
RDW: 16.2 % — ABNORMAL HIGH (ref 11.6–15.4)
WBC: 9 10*3/uL (ref 3.4–10.8)

## 2019-10-21 LAB — CUP PACEART INCLINIC DEVICE CHECK
Battery Remaining Longevity: 19 mo
Battery Voltage: 2.93 V
Brady Statistic RA Percent Paced: 0.24 %
Brady Statistic RV Percent Paced: 65.21 %
Date Time Interrogation Session: 20210714105057
HighPow Impedance: 69 Ohm
Implantable Lead Implant Date: 20170113
Implantable Lead Implant Date: 20170113
Implantable Lead Implant Date: 20170113
Implantable Lead Location: 753858
Implantable Lead Location: 753859
Implantable Lead Location: 753860
Implantable Lead Model: 4298
Implantable Lead Model: 5076
Implantable Pulse Generator Implant Date: 20170113
Lead Channel Impedance Value: 380 Ohm
Lead Channel Impedance Value: 399 Ohm
Lead Channel Impedance Value: 399 Ohm
Lead Channel Impedance Value: 437 Ohm
Lead Channel Impedance Value: 437 Ohm
Lead Channel Impedance Value: 437 Ohm
Lead Channel Impedance Value: 494 Ohm
Lead Channel Impedance Value: 513 Ohm
Lead Channel Impedance Value: 722 Ohm
Lead Channel Impedance Value: 760 Ohm
Lead Channel Impedance Value: 779 Ohm
Lead Channel Impedance Value: 779 Ohm
Lead Channel Impedance Value: 836 Ohm
Lead Channel Pacing Threshold Amplitude: 0.375 V
Lead Channel Pacing Threshold Amplitude: 0.75 V
Lead Channel Pacing Threshold Amplitude: 0.875 V
Lead Channel Pacing Threshold Pulse Width: 0.4 ms
Lead Channel Pacing Threshold Pulse Width: 0.4 ms
Lead Channel Pacing Threshold Pulse Width: 0.4 ms
Lead Channel Sensing Intrinsic Amplitude: 0.625 mV
Lead Channel Sensing Intrinsic Amplitude: 0.875 mV
Lead Channel Sensing Intrinsic Amplitude: 6 mV
Lead Channel Sensing Intrinsic Amplitude: 9.75 mV
Lead Channel Setting Pacing Amplitude: 1.5 V
Lead Channel Setting Pacing Amplitude: 2 V
Lead Channel Setting Pacing Amplitude: 2 V
Lead Channel Setting Pacing Pulse Width: 0.4 ms
Lead Channel Setting Pacing Pulse Width: 0.4 ms
Lead Channel Setting Sensing Sensitivity: 0.3 mV

## 2019-10-21 LAB — BASIC METABOLIC PANEL
BUN/Creatinine Ratio: 16 (ref 10–24)
BUN: 28 mg/dL — ABNORMAL HIGH (ref 8–27)
CO2: 22 mmol/L (ref 20–29)
Calcium: 9 mg/dL (ref 8.6–10.2)
Chloride: 102 mmol/L (ref 96–106)
Creatinine, Ser: 1.77 mg/dL — ABNORMAL HIGH (ref 0.76–1.27)
GFR calc Af Amer: 40 mL/min/{1.73_m2} — ABNORMAL LOW (ref >59)
GFR calc non Af Amer: 35 mL/min/{1.73_m2} — ABNORMAL LOW (ref >59)
Glucose: 116 mg/dL — ABNORMAL HIGH (ref 65–99)
Potassium: 5 mmol/L (ref 3.5–5.2)
Sodium: 133 mmol/L — ABNORMAL LOW (ref 134–144)

## 2019-10-21 LAB — MAGNESIUM: Magnesium: 2.3 mg/dL (ref 1.6–2.3)

## 2019-10-21 NOTE — Patient Instructions (Signed)
Medication Instructions:  *If you need a refill on your cardiac medications before your next appointment, please call your pharmacy*  Lab Work: Your physician has recommended that you have lab work today: BMET, CBC, and Magnesium Level  If you have labs (blood work) drawn today and your tests are completely normal, you will receive your results only by: Marland Kitchen MyChart Message (if you have MyChart) OR . A paper copy in the mail If you have any lab test that is abnormal or we need to change your treatment, we will call you to review the results.  Testing/Procedures: Your physician has recommended that you have a Cardioversion (DCCV) on Wednesday Juny 21, 2021 at 2:30 pm. Electrical Cardioversion uses a jolt of electricity to your heart either through paddles or wired patches attached to your chest. This is a controlled, usually prescheduled, procedure. Defibrillation is done under light anesthesia in the hospital, and you usually go home the day of the procedure. This is done to get your heart back into a normal rhythm. You are not awake for the procedure. Please see the instruction sheet given to you today.  Follow-Up: At [facility], you and your health needs are our priority.  As part of our continuing mission to provide you with exceptional heart care, we have created designated Provider Care Teams.  These Care Teams include your primary Cardiologist (physician) and Advanced Practice Providers (APPs -  Physician Assistants and Nurse Practitioners) who all work together to provide you with the care you need, when you need it.  We recommend signing up for the patient portal called "MyChart".  Sign up information is provided on this After Visit Summary.  MyChart is used to connect with patients for Virtual Visits (Telemedicine).  Patients are able to view lab/test results, encounter notes, upcoming appointments, etc.  Non-urgent messages can be sent to your provider as well.   To learn more about what  you can do with MyChart, go to [email or web address].    Your next appointment:   Your physician recommends follow-up on Wednesday 11/18/19 at 1:00 pm with Tommye Standard, PA-C  Your physician recommends that you schedule a follow-up appointment in: 1-2 MONTHS with the [facility] Your physician recommends that you keep your scheduled follow-up appointment on Tuesday 01/12/20 with Dr. Curt Bears  The format for your next appointment:   In Person with Tommye Standard, PA-C and with A[facility]   Other [facility] OFFICE [place], [place] [address] Dept: [phone] Loc: [place]  10/21/2019  You are scheduled for a Cardioversion on Wednesday, October 28, 2019  1. Please arrive at the [facility] (Main Entrance A) at [facility]: [address] [name], [place] [address] at 1:00 PM (This time is about an hour before your procedure to ensure your preparation). Free valet parking service is available.   Special note: Every effort is made to have your procedure done on time. Please understand that emergencies sometimes delay scheduled procedures.  2. Diet: Do not eat solid foods after midnight.  The patient may have clear liquids until 5am upon the day of the procedure.  3. Labs: You will have your labs drawn in the office today. You do not need to be fasting.  4. Medication instructions in preparation for your procedure:  You may take all your regular moring medication EXCEPT your Lasix (Furosemife) the morning of  your procedure with small sips of water.  5. Bring a current list of your medications and current insurance cards. 6. You MUST have a responsible person to drive you home. 7. Please wear clothes that are easy to get on and off and wear slip-on shoes.  Thank you for allowing [place] to care for you!   -- [facility]

## 2019-10-21 NOTE — Patient Outreach (Signed)
[place] [facility] [facility]) Care Management  10/21/2019  JAMEIR AKE [birth year] [id number]     Transition of Care Referral  Referral Date: 10/21/2019 Referral [place] Discharge Report Date of Discharge: 10/19/2019 Facility: [facility] Insurance: [facility]    Referral received. Transition of care calls being completed via EMMI-automated calls. RN CM will outreach patient for any red flags received.     Plan: RN CM will close case at this time.    Enzo Montgomery, RN,BSN,CCM [place] Management Telephonic Care Management Coordinator Direct Phone: [phone] Toll Free: [phone] Fax: [phone]

## 2019-10-24 DIAGNOSIS — I130 Hypertensive heart and chronic kidney disease with heart failure and stage 1 through stage 4 chronic kidney disease, or unspecified chronic kidney disease: Secondary | ICD-10-CM | POA: Diagnosis not present

## 2019-10-24 DIAGNOSIS — D631 Anemia in chronic kidney disease: Secondary | ICD-10-CM | POA: Diagnosis not present

## 2019-10-24 DIAGNOSIS — K5792 Diverticulitis of intestine, part unspecified, without perforation or abscess without bleeding: Secondary | ICD-10-CM | POA: Diagnosis not present

## 2019-10-24 DIAGNOSIS — I4819 Other persistent atrial fibrillation: Secondary | ICD-10-CM | POA: Diagnosis not present

## 2019-10-24 DIAGNOSIS — I951 Orthostatic hypotension: Secondary | ICD-10-CM | POA: Diagnosis not present

## 2019-10-24 DIAGNOSIS — E1122 Type 2 diabetes mellitus with diabetic chronic kidney disease: Secondary | ICD-10-CM | POA: Diagnosis not present

## 2019-10-24 DIAGNOSIS — N184 Chronic kidney disease, stage 4 (severe): Secondary | ICD-10-CM | POA: Diagnosis not present

## 2019-10-24 DIAGNOSIS — K5790 Diverticulosis of intestine, part unspecified, without perforation or abscess without bleeding: Secondary | ICD-10-CM | POA: Diagnosis not present

## 2019-10-24 DIAGNOSIS — I5042 Chronic combined systolic (congestive) and diastolic (congestive) heart failure: Secondary | ICD-10-CM | POA: Diagnosis not present

## 2019-10-26 DIAGNOSIS — I130 Hypertensive heart and chronic kidney disease with heart failure and stage 1 through stage 4 chronic kidney disease, or unspecified chronic kidney disease: Secondary | ICD-10-CM | POA: Diagnosis not present

## 2019-10-26 DIAGNOSIS — K5790 Diverticulosis of intestine, part unspecified, without perforation or abscess without bleeding: Secondary | ICD-10-CM | POA: Diagnosis not present

## 2019-10-26 DIAGNOSIS — E1122 Type 2 diabetes mellitus with diabetic chronic kidney disease: Secondary | ICD-10-CM | POA: Diagnosis not present

## 2019-10-26 DIAGNOSIS — D631 Anemia in chronic kidney disease: Secondary | ICD-10-CM | POA: Diagnosis not present

## 2019-10-26 DIAGNOSIS — K5792 Diverticulitis of intestine, part unspecified, without perforation or abscess without bleeding: Secondary | ICD-10-CM | POA: Diagnosis not present

## 2019-10-26 DIAGNOSIS — I951 Orthostatic hypotension: Secondary | ICD-10-CM | POA: Diagnosis not present

## 2019-10-26 DIAGNOSIS — N184 Chronic kidney disease, stage 4 (severe): Secondary | ICD-10-CM | POA: Diagnosis not present

## 2019-10-26 DIAGNOSIS — I5042 Chronic combined systolic (congestive) and diastolic (congestive) heart failure: Secondary | ICD-10-CM | POA: Diagnosis not present

## 2019-10-26 DIAGNOSIS — I4819 Other persistent atrial fibrillation: Secondary | ICD-10-CM | POA: Diagnosis not present

## 2019-10-27 ENCOUNTER — Telehealth: Payer: Self-pay | Admitting: Licensed Clinical Social Worker

## 2019-10-27 ENCOUNTER — Other Ambulatory Visit (HOSPITAL_COMMUNITY)
Admission: RE | Admit: 2019-10-27 | Discharge: 2019-10-27 | Disposition: A | Discharge status: Home / Self Care | Payer: Medicare HMO | Source: Ambulatory Visit | Attending: Cardiology | Admitting: Cardiology

## 2019-10-27 DIAGNOSIS — E1122 Type 2 diabetes mellitus with diabetic chronic kidney disease: Secondary | ICD-10-CM | POA: Diagnosis not present

## 2019-10-27 DIAGNOSIS — N184 Chronic kidney disease, stage 4 (severe): Secondary | ICD-10-CM | POA: Diagnosis not present

## 2019-10-27 DIAGNOSIS — I5042 Chronic combined systolic (congestive) and diastolic (congestive) heart failure: Secondary | ICD-10-CM | POA: Diagnosis not present

## 2019-10-27 DIAGNOSIS — I951 Orthostatic hypotension: Secondary | ICD-10-CM | POA: Diagnosis not present

## 2019-10-27 DIAGNOSIS — Z01812 Encounter for preprocedural laboratory examination: Secondary | ICD-10-CM | POA: Diagnosis not present

## 2019-10-27 DIAGNOSIS — Z20822 Contact with and (suspected) exposure to covid-19: Secondary | ICD-10-CM | POA: Insufficient documentation

## 2019-10-27 DIAGNOSIS — K5792 Diverticulitis of intestine, part unspecified, without perforation or abscess without bleeding: Secondary | ICD-10-CM | POA: Diagnosis not present

## 2019-10-27 DIAGNOSIS — K5790 Diverticulosis of intestine, part unspecified, without perforation or abscess without bleeding: Secondary | ICD-10-CM | POA: Diagnosis not present

## 2019-10-27 DIAGNOSIS — D631 Anemia in chronic kidney disease: Secondary | ICD-10-CM | POA: Diagnosis not present

## 2019-10-27 DIAGNOSIS — I130 Hypertensive heart and chronic kidney disease with heart failure and stage 1 through stage 4 chronic kidney disease, or unspecified chronic kidney disease: Secondary | ICD-10-CM | POA: Diagnosis not present

## 2019-10-27 DIAGNOSIS — I4819 Other persistent atrial fibrillation: Secondary | ICD-10-CM | POA: Diagnosis not present

## 2019-10-27 LAB — SARS CORONAVIRUS 2 (TAT 6-24 HRS): SARS Coronavirus 2: NEGATIVE

## 2019-10-27 NOTE — Telephone Encounter (Signed)
CSW consulted to assist with transportation for pt to get to and from his COVID test today so he could proceed with procedure tomorrow.  CSW able to set up transportation to pick pt up at 2pm today and take to get tested- CSW called pt to inform.  Will continue to follow and assist as needed  Jorge Ny, [facility] Desk#: [phone] Cell#: [phone]

## 2019-10-28 ENCOUNTER — Ambulatory Visit (HOSPITAL_COMMUNITY)
Admission: RE | Admit: 2019-10-28 | Discharge: 2019-10-28 | Disposition: A | Discharge status: Home / Self Care | Payer: Medicare HMO | Attending: Cardiology | Admitting: Cardiology

## 2019-10-28 ENCOUNTER — Ambulatory Visit (HOSPITAL_COMMUNITY): Payer: Medicare HMO | Admitting: Certified Registered Nurse Anesthetist

## 2019-10-28 ENCOUNTER — Encounter (HOSPITAL_COMMUNITY)
Admission: RE | Disposition: A | Discharge status: Home / Self Care | Payer: Self-pay | Source: Home / Self Care | Attending: Cardiology

## 2019-10-28 ENCOUNTER — Other Ambulatory Visit: Payer: Self-pay

## 2019-10-28 ENCOUNTER — Encounter (HOSPITAL_COMMUNITY): Payer: Self-pay | Admitting: Cardiology

## 2019-10-28 DIAGNOSIS — Z951 Presence of aortocoronary bypass graft: Secondary | ICD-10-CM | POA: Diagnosis not present

## 2019-10-28 DIAGNOSIS — I420 Dilated cardiomyopathy: Secondary | ICD-10-CM | POA: Diagnosis not present

## 2019-10-28 DIAGNOSIS — N183 Chronic kidney disease, stage 3 unspecified: Secondary | ICD-10-CM | POA: Diagnosis not present

## 2019-10-28 DIAGNOSIS — I5042 Chronic combined systolic (congestive) and diastolic (congestive) heart failure: Secondary | ICD-10-CM | POA: Insufficient documentation

## 2019-10-28 DIAGNOSIS — Z79899 Other long term (current) drug therapy: Secondary | ICD-10-CM | POA: Diagnosis not present

## 2019-10-28 DIAGNOSIS — Z7901 Long term (current) use of anticoagulants: Secondary | ICD-10-CM | POA: Diagnosis not present

## 2019-10-28 DIAGNOSIS — I472 Ventricular tachycardia: Secondary | ICD-10-CM | POA: Insufficient documentation

## 2019-10-28 DIAGNOSIS — E785 Hyperlipidemia, unspecified: Secondary | ICD-10-CM | POA: Insufficient documentation

## 2019-10-28 DIAGNOSIS — K219 Gastro-esophageal reflux disease without esophagitis: Secondary | ICD-10-CM | POA: Insufficient documentation

## 2019-10-28 DIAGNOSIS — Z95810 Presence of automatic (implantable) cardiac defibrillator: Secondary | ICD-10-CM | POA: Diagnosis not present

## 2019-10-28 DIAGNOSIS — Z952 Presence of prosthetic heart valve: Secondary | ICD-10-CM | POA: Insufficient documentation

## 2019-10-28 DIAGNOSIS — I2510 Atherosclerotic heart disease of native coronary artery without angina pectoris: Secondary | ICD-10-CM | POA: Insufficient documentation

## 2019-10-28 DIAGNOSIS — I130 Hypertensive heart and chronic kidney disease with heart failure and stage 1 through stage 4 chronic kidney disease, or unspecified chronic kidney disease: Secondary | ICD-10-CM | POA: Insufficient documentation

## 2019-10-28 DIAGNOSIS — N184 Chronic kidney disease, stage 4 (severe): Secondary | ICD-10-CM | POA: Diagnosis not present

## 2019-10-28 DIAGNOSIS — I4819 Other persistent atrial fibrillation: Secondary | ICD-10-CM

## 2019-10-28 DIAGNOSIS — I4892 Unspecified atrial flutter: Secondary | ICD-10-CM | POA: Diagnosis not present

## 2019-10-28 HISTORY — PX: CARDIOVERSION: SHX1299

## 2019-10-28 SURGERY — CARDIOVERSION
Anesthesia: General

## 2019-10-28 MED ORDER — SODIUM CHLORIDE 0.9 % IV SOLN
INTRAVENOUS | Status: AC | PRN
  Administered 2019-10-28: 500 mL via INTRAVENOUS

## 2019-10-28 MED ORDER — PROPOFOL 10 MG/ML IV BOLUS
INTRAVENOUS | Status: DC | PRN
  Administered 2019-10-28: 50 mg via INTRAVENOUS

## 2019-10-28 MED ORDER — LIDOCAINE 2% (20 MG/ML) 5 ML SYRINGE
INTRAMUSCULAR | Status: DC | PRN
  Administered 2019-10-28: 60 mg via INTRAVENOUS

## 2019-10-28 NOTE — Anesthesia Preprocedure Evaluation (Signed)
Anesthesia Evaluation  Patient identified by MRN, date of birth, ID band Patient awake    Reviewed: Allergy & Precautions, NPO status , Patient's Chart, lab work & pertinent test results  Airway Mallampati: II  TM Distance: >3 FB Neck ROM: Full    Dental no notable dental hx. (+) Teeth Intact, Dental Advisory Given   Pulmonary neg pulmonary ROS,    Pulmonary exam normal breath sounds clear to auscultation       Cardiovascular hypertension, Pt. on medications Normal cardiovascular exam Rhythm:Regular Rate:Normal  Echo 5/.21/21 1. Left ventricular ejection fraction, by estimation, is 30 to 35%. The  left ventricle has moderately decreased function. Left ventricular  endocardial border not optimally defined to evaluate regional wall motion.  Left ventricular diastolic parameters  are consistent with Grade II diastolic dysfunction (pseudonormalization).    Neuro/Psych negative neurological ROS  negative psych ROS   GI/Hepatic Neg liver ROS, GERD  ,  Endo/Other  Hyperthyroidism   Renal/GU Renal disease     Musculoskeletal   Abdominal   Peds  Hematology   Anesthesia Other Findings   Reproductive/Obstetrics                             Anesthesia Physical Anesthesia Plan  ASA: IV  Anesthesia Plan: General   Post-op Pain Management:    Induction: Intravenous  PONV Risk Score and Plan: Treatment may vary due to age or medical condition  Airway Management Planned: Mask  Additional Equipment:   Intra-op Plan:   Post-operative Plan:   Informed Consent: I have reviewed the patients History and Physical, chart, labs and discussed the procedure including the risks, benefits and alternatives for the proposed anesthesia with the patient or authorized representative who has indicated his/her understanding and acceptance.     Dental advisory given  Plan Discussed with: CRNA and  Surgeon  Anesthesia Plan Comments:         Anesthesia Quick Evaluation

## 2019-10-28 NOTE — Transfer of Care (Signed)
Immediate Anesthesia Transfer of Care Note  Patient: [name]  Procedure(s) Performed: CARDIOVERSION (N/A )  Patient Location: Endoscopy Unit  Anesthesia Type:General  Level of Consciousness: awake and alert   Airway & Oxygen Therapy: Patient Spontanous Breathing  Post-op Assessment: Report given to RN and Post -op Vital signs reviewed and stable  Post vital signs: Reviewed and stable  Last Vitals:  Vitals Value Taken Time  BP 126/61 10/28/19 1426  Temp    Pulse 63 10/28/19 1429  Resp 11 10/28/19 1429  SpO2 100 % 10/28/19 1429    Last Pain:  Vitals:   10/28/19 1330  TempSrc: Axillary  PainSc: 0-No pain         Complications: No complications documented.

## 2019-10-28 NOTE — CV Procedure (Signed)
° °  Electrical Cardioversion Procedure Note [name] [id number] 31-Dec-1932  Procedure: Electrical Cardioversion Indications:  Atrial Fibrillation  Time Out: Verified patient identification, verified procedure,medications/allergies/relevent history reviewed, required imaging and test results available.  Performed  Procedure Details  The patient was NPO after midnight. Anesthesia was administered at the beside  by Dr.Houser with 1m of propofol and 663mLidocaine.  Cardioversion was done with synchronized biphasic defibrillation with AP pads with 150watts.  The patient converted to AV paced rhythm.  The patient tolerated the procedure well   IMPRESSION:  Successful cardioversion of atrial fibrillation    [name] 10/28/2019, 1:10 PM

## 2019-10-28 NOTE — Discharge Instructions (Signed)
Electrical Cardioversion Electrical cardioversion is the delivery of a jolt of electricity to restore a normal rhythm to the heart. A rhythm that is too fast or is not regular keeps the heart from pumping well. In this procedure, sticky patches or metal paddles are placed on the chest to deliver electricity to the heart from a device. This procedure may be done in an emergency if:  There is low or no blood pressure as a result of the heart rhythm.  Normal rhythm must be restored as fast as possible to protect the brain and heart from further damage.  It may save a life. This may also be a scheduled procedure for irregular or fast heart rhythms that are not immediately life-threatening. Tell a health care provider about:  Any allergies you have.  All medicines you are taking, including vitamins, herbs, eye drops, creams, and over-the-counter medicines.  Any problems you or family members have had with anesthetic medicines.  Any blood disorders you have.  Any surgeries you have had.  Any medical conditions you have.  Whether you are pregnant or may be pregnant. What are the risks? Generally, this is a safe procedure. However, problems may occur, including:  Allergic reactions to medicines.  A blood clot that breaks free and travels to other parts of your body.  The possible return of an abnormal heart rhythm within hours or days after the procedure.  Your heart stopping (cardiac arrest). This is rare. What happens before the procedure? Medicines  Your health care provider may have you start taking: ? Blood-thinning medicines (anticoagulants) so your blood does not clot as easily. ? Medicines to help stabilize your heart rate and rhythm.  Ask your health care provider about: ? Changing or stopping your regular medicines. This is especially important if you are taking diabetes medicines or blood thinners. ? Taking medicines such as aspirin and ibuprofen. These medicines can  thin your blood. Do not take these medicines unless your health care provider tells you to take them. ? Taking over-the-counter medicines, vitamins, herbs, and supplements. General instructions  Follow instructions from your health care provider about eating or drinking restrictions.  Plan to have someone take you home from the hospital or clinic.  If you will be going home right after the procedure, plan to have someone with you for 24 hours.  Ask your health care provider what steps will be taken to help prevent infection. These may include washing your skin with a germ-killing soap. What happens during the procedure?   An IV will be inserted into one of your veins.  Sticky patches (electrodes) or metal paddles may be placed on your chest.  You will be given a medicine to help you relax (sedative).  An electrical shock will be delivered. The procedure may vary among health care providers and hospitals. What can I expect after the procedure?  Your blood pressure, heart rate, breathing rate, and blood oxygen level will be monitored until you leave the hospital or clinic.  Your heart rhythm will be watched to make sure it does not change.  You may have some redness on the skin where the shocks were given. Follow these instructions at home:  Do not drive for 24 hours if you were given a sedative during your procedure.  Take over-the-counter and prescription medicines only as told by your health care provider.  Ask your health care provider how to check your pulse. Check it often.  Rest for 48 hours after the procedure or   as told by your health care provider.  Avoid or limit your caffeine use as told by your health care provider.  Keep all follow-up visits as told by your health care provider. This is important. Contact a health care provider if:  You feel like your heart is beating too quickly or your pulse is not regular.  You have a serious muscle cramp that does not go  away. Get help right away if:  You have discomfort in your chest.  You are dizzy or you feel faint.  You have trouble breathing or you are short of breath.  Your speech is slurred.  You have trouble moving an arm or leg on one side of your body.  Your fingers or toes turn cold or blue. Summary  Electrical cardioversion is the delivery of a jolt of electricity to restore a normal rhythm to the heart.  This procedure may be done right away in an emergency or may be a scheduled procedure if the condition is not an emergency.  Generally, this is a safe procedure.  After the procedure, check your pulse often as told by your health care provider. This information is not intended to replace advice given to you by your health care provider. Make sure you discuss any questions you have with your health care provider. Document Revised: 10/27/2018 Document Reviewed: 10/27/2018 Elsevier Patient Education  [place].

## 2019-10-28 NOTE — Interval H&P Note (Signed)
History and Physical Interval Note:  10/28/2019 1:10 PM  [name]  has presented today for surgery, with the diagnosis of [place].  The various methods of treatment have been discussed with the patient and family. After consideration of risks, benefits and other options for treatment, the patient has consented to  Procedure(s): CARDIOVERSION (N/A) as a surgical intervention.  The patient's history has been reviewed, patient examined, no change in status, stable for surgery.  I have reviewed the patient's chart and labs.  Questions were answered to the patient's satisfaction.     [name]

## 2019-10-28 NOTE — Anesthesia Postprocedure Evaluation (Signed)
Anesthesia Post Note  Patient: [name]  Procedure(s) Performed: CARDIOVERSION (N/A )     Patient location during evaluation: Endoscopy Anesthesia Type: General Level of consciousness: awake and alert Pain management: pain level controlled Vital Signs Assessment: post-procedure vital signs reviewed and stable Respiratory status: spontaneous breathing, nonlabored ventilation, respiratory function stable and patient connected to nasal cannula oxygen Cardiovascular status: blood pressure returned to baseline and stable Postop Assessment: no apparent nausea or vomiting Anesthetic complications: no   No complications documented.  Last Vitals:  Vitals:   10/28/19 1452 10/28/19 1502  BP: 140/69 (!) 141/70  Pulse: 60 64  Resp: 18 17  Temp:    SpO2: 100% 100%    Last Pain:  Vitals:   10/28/19 1502  TempSrc:   PainSc: 0-No pain                 Barnet Glasgow

## 2019-10-30 DIAGNOSIS — E1122 Type 2 diabetes mellitus with diabetic chronic kidney disease: Secondary | ICD-10-CM | POA: Diagnosis not present

## 2019-10-30 DIAGNOSIS — I951 Orthostatic hypotension: Secondary | ICD-10-CM | POA: Diagnosis not present

## 2019-10-30 DIAGNOSIS — I5042 Chronic combined systolic (congestive) and diastolic (congestive) heart failure: Secondary | ICD-10-CM | POA: Diagnosis not present

## 2019-10-30 DIAGNOSIS — D631 Anemia in chronic kidney disease: Secondary | ICD-10-CM | POA: Diagnosis not present

## 2019-10-30 DIAGNOSIS — K5792 Diverticulitis of intestine, part unspecified, without perforation or abscess without bleeding: Secondary | ICD-10-CM | POA: Diagnosis not present

## 2019-10-30 DIAGNOSIS — I4819 Other persistent atrial fibrillation: Secondary | ICD-10-CM | POA: Diagnosis not present

## 2019-10-30 DIAGNOSIS — I130 Hypertensive heart and chronic kidney disease with heart failure and stage 1 through stage 4 chronic kidney disease, or unspecified chronic kidney disease: Secondary | ICD-10-CM | POA: Diagnosis not present

## 2019-10-30 DIAGNOSIS — N184 Chronic kidney disease, stage 4 (severe): Secondary | ICD-10-CM | POA: Diagnosis not present

## 2019-10-30 DIAGNOSIS — K5790 Diverticulosis of intestine, part unspecified, without perforation or abscess without bleeding: Secondary | ICD-10-CM | POA: Diagnosis not present

## 2019-11-02 MED ORDER — SOTALOL HCL 80 MG PO TABS: 80.0000 mg | ORAL_TABLET | Freq: Two times a day (BID) | ORAL | 1 refills | Status: DC

## 2019-11-04 DIAGNOSIS — N184 Chronic kidney disease, stage 4 (severe): Secondary | ICD-10-CM | POA: Diagnosis not present

## 2019-11-04 DIAGNOSIS — E7800 Pure hypercholesterolemia, unspecified: Secondary | ICD-10-CM | POA: Diagnosis not present

## 2019-11-04 DIAGNOSIS — K2270 Barrett's esophagus without dysplasia: Secondary | ICD-10-CM | POA: Diagnosis not present

## 2019-11-04 DIAGNOSIS — I472 Ventricular tachycardia: Secondary | ICD-10-CM | POA: Diagnosis not present

## 2019-11-04 DIAGNOSIS — I509 Heart failure, unspecified: Secondary | ICD-10-CM | POA: Diagnosis not present

## 2019-11-04 DIAGNOSIS — I255 Ischemic cardiomyopathy: Secondary | ICD-10-CM | POA: Diagnosis not present

## 2019-11-04 DIAGNOSIS — I2510 Atherosclerotic heart disease of native coronary artery without angina pectoris: Secondary | ICD-10-CM | POA: Diagnosis not present

## 2019-11-04 DIAGNOSIS — I10 Essential (primary) hypertension: Secondary | ICD-10-CM | POA: Diagnosis not present

## 2019-11-04 DIAGNOSIS — E1121 Type 2 diabetes mellitus with diabetic nephropathy: Secondary | ICD-10-CM | POA: Diagnosis not present

## 2019-11-04 DIAGNOSIS — Z954 Presence of other heart-valve replacement: Secondary | ICD-10-CM | POA: Diagnosis not present

## 2019-11-04 DIAGNOSIS — I4891 Unspecified atrial fibrillation: Secondary | ICD-10-CM | POA: Diagnosis not present

## 2019-11-04 DIAGNOSIS — Z7901 Long term (current) use of anticoagulants: Secondary | ICD-10-CM | POA: Diagnosis not present

## 2019-11-05 DIAGNOSIS — I130 Hypertensive heart and chronic kidney disease with heart failure and stage 1 through stage 4 chronic kidney disease, or unspecified chronic kidney disease: Secondary | ICD-10-CM | POA: Diagnosis not present

## 2019-11-05 DIAGNOSIS — K5792 Diverticulitis of intestine, part unspecified, without perforation or abscess without bleeding: Secondary | ICD-10-CM | POA: Diagnosis not present

## 2019-11-05 DIAGNOSIS — I5042 Chronic combined systolic (congestive) and diastolic (congestive) heart failure: Secondary | ICD-10-CM | POA: Diagnosis not present

## 2019-11-05 DIAGNOSIS — N184 Chronic kidney disease, stage 4 (severe): Secondary | ICD-10-CM | POA: Diagnosis not present

## 2019-11-05 DIAGNOSIS — I951 Orthostatic hypotension: Secondary | ICD-10-CM | POA: Diagnosis not present

## 2019-11-05 DIAGNOSIS — I4819 Other persistent atrial fibrillation: Secondary | ICD-10-CM | POA: Diagnosis not present

## 2019-11-05 DIAGNOSIS — K5790 Diverticulosis of intestine, part unspecified, without perforation or abscess without bleeding: Secondary | ICD-10-CM | POA: Diagnosis not present

## 2019-11-05 DIAGNOSIS — E1122 Type 2 diabetes mellitus with diabetic chronic kidney disease: Secondary | ICD-10-CM | POA: Diagnosis not present

## 2019-11-05 DIAGNOSIS — D631 Anemia in chronic kidney disease: Secondary | ICD-10-CM | POA: Diagnosis not present

## 2019-11-11 DIAGNOSIS — N184 Chronic kidney disease, stage 4 (severe): Secondary | ICD-10-CM | POA: Diagnosis not present

## 2019-11-11 DIAGNOSIS — E1122 Type 2 diabetes mellitus with diabetic chronic kidney disease: Secondary | ICD-10-CM | POA: Diagnosis not present

## 2019-11-11 DIAGNOSIS — D631 Anemia in chronic kidney disease: Secondary | ICD-10-CM | POA: Diagnosis not present

## 2019-11-11 DIAGNOSIS — I130 Hypertensive heart and chronic kidney disease with heart failure and stage 1 through stage 4 chronic kidney disease, or unspecified chronic kidney disease: Secondary | ICD-10-CM | POA: Diagnosis not present

## 2019-11-11 DIAGNOSIS — I4819 Other persistent atrial fibrillation: Secondary | ICD-10-CM | POA: Diagnosis not present

## 2019-11-11 DIAGNOSIS — I951 Orthostatic hypotension: Secondary | ICD-10-CM | POA: Diagnosis not present

## 2019-11-11 DIAGNOSIS — I5042 Chronic combined systolic (congestive) and diastolic (congestive) heart failure: Secondary | ICD-10-CM | POA: Diagnosis not present

## 2019-11-11 DIAGNOSIS — K5792 Diverticulitis of intestine, part unspecified, without perforation or abscess without bleeding: Secondary | ICD-10-CM | POA: Diagnosis not present

## 2019-11-11 DIAGNOSIS — K5790 Diverticulosis of intestine, part unspecified, without perforation or abscess without bleeding: Secondary | ICD-10-CM | POA: Diagnosis not present

## 2019-11-16 NOTE — Progress Notes (Signed)
Cardiology Office Note Date:  11/16/2019  Patient ID:  [name], [name] [birth year], MRN [id number] PCP:  [name], MD  Cardiologist:  Dr. Radford Pax AHF: Dr. Haroldine Laws Electrophysiologist: Dr. Curt Bears    Chief Complaint: scheduled follow up  History of Present Illness: [name] is a 86 y.o. male with history of CAD/VHD s/p CABG/bioprosthetic AVR in 2009, chronic CHF (systolic), ICM, PAF, orthostatic hypotension, VT, ICD.   March 2019 required right thoras (x2), April another 2L drained Admitted 3/9 -> 06/20/17 with recurrent pleural effusion and markedly elevated BNP. Underwent Thoracentesis x 2. Had NSVT up to 40 beats, electrolytes replaced aggressively.  PYP scan 06/20/17 NOT suggestive of TTR amyloid)(and eventually placement of Pleurex catheter (Dr. Nils Pyle) and removed June, CKD (III).  He has had recurrent VT over the years with appropriate therapies both ATP and HV therapy He has also had double tachycardia   09/25/19 Seen by A. Lynnell Jude, NP 2/2 to having been found on remotes to have prolonged episode of VT under detection that eventually accelerated enuough to get HV therapy. On arrival to her visi he was in a double tachycardia and with JA supervision pace terminated the VT though unable to terminate his AFlutter His sotalol was had been increased at a recent hospital stay though for unclear reasons discharged on 20m BID His sotalol was resumed to 843mBID, his VT zone HR was reduced.   He [place]20[place], he remained in , had mot missed any OAC doses and planned for DCCV > 10/28/19 successful He comes in today for f/u after DCCV.   TODAY He is doing OK.  Has been slow to recover after his long hospital then rehab stay. This weekend though he felt like he made a bit of improvement, a little more energy in general, but not as much as he would like. No CP or palpitations.  No dizziness, near syncope or syncope. He denies bleeding  or signs of bleeding He reports about a 1000 feet before he gets winded, the heat is worse No rest SOB, reports   He is diligent about his weights and furosemide. He reports his dry weight at 182.  This AM he weighed 183.5 He takes furosemide 2060m1/2 tab) every other day and daily for weight gain 9he reports in d/w Dr. CamCurt Bearsnd this has been what has been working for him) No rest SOB, no symptoms of PND or orthopnea   Device information MDT CRT-D, implanted1/13/17 + hx of numerous appropriate therapies VT   AAD Hx: Amiodarone stopped Jan 2019, 2/2 SOB,  No clear finding of amio tox >> mexiletine started More VT and sotalol was added Dec 2020   Past Medical History:  Diagnosis Date  . Arthritis    "minor everywhere" (04/22/2015)  . Asthma    "a touch"  . Barrett esophagus   . CAD (coronary artery disease)    a. s/p 3 vvessel CABG with LIMA to LAD, SVG to diagonal 1, SVG to RCA 12/09.  . Cardiac resynchronization therapy defibrillator (CRT-D) in place   . Chronic anticoagulation 01/23/2013  . Chronic combined systolic and diastolic CHF (congestive heart failure) (HCC)    dry weight 197-200lbs.  . CKD (chronic kidney disease) stage 3, GFR 30-59 ml/min 08/23/2014  . DCM (dilated [place]) [place]  initially concerned for TTP amyloidosis but Tc-PYP scan 06/2017 was not consistent with amyloid and SPEP/UPEP with no M spike. EF 20-25% by  echo 2019  . Diverticulitis 08/2019  . Dyslipidemia 01/23/2013  . Essential hypertension 01/23/2013  . GERD (gastroesophageal reflux disease)   . GI bleed 05/08/2017  . H/O: rheumatic fever   . Hepatitis 1957   "don't know what kind:  . History of hiatal hernia   . History of PFTs    a. Amiodarone started 5/16 >> PFTs w/ DLCO 6/16:  FEV1 72% predicted, FEV1/FVC 66%, DLCO 66% >> minimal reversible obstructive airways disease with mild diffusion defect (suggestive of emphysema but absence of hyperinflation inconsistent with dx)  .  Hyperkalemia 08/23/2014  . Hyperthyroidism 05/26/2017  . Leg weakness 01/28/2017  . Multiple thyroid nodules 08/13/2017  . Orthostatic hypotension   . Persistent atrial fibrillation ([place]) 01/23/2013  . Pleural effusion on right 05/26/2017  . Pneumonia 08/2014   "dr thought I may have had a touch"  . Pre-diabetes   . Protein-calorie malnutrition, severe 05/27/2017  . S/P AVR (aortic valve replacement) 2009   a. severe AS s/p AVR with pericardial tissue valve 2009.  [name] Shingles 08/23/2014  . VT (ventricular tachycardia) ([place]) 05/06/2017    Past Surgical History:  Procedure Laterality Date  . AORTIC VALVE REPLACEMENT  with 23-mm Magna Ease pericardial valve, model number   with 23-mm Magna Ease pericardial valve, model [id number], serial number [id number]   . APPENDECTOMY  1940s  . BI-VENTRICULAR IMPLANTABLE CARDIOVERTER DEFIBRILLATOR  (CRT-D)  04/22/2015  . CARDIAC CATHETERIZATION N/A 11/04/2014   Procedure: Left Heart Cath and Cors/Grafts Angiography;  Surgeon: Leonie Man, MD;  Location: [place] [facility];  Service: Cardiovascular;  Laterality: N/A;  . CARDIAC VALVE REPLACEMENT    . CARDIOVERSION N/A 01/27/2013   Procedure: CARDIOVERSION;  Surgeon: Sueanne Margarita, MD;  Location: [facility];  Service: Cardiovascular;  Laterality: N/A;  . CARDIOVERSION N/A 08/25/2014   Procedure: CARDIOVERSION;  Surgeon: Sanda Klein, MD;  Location: [facility];  Service: Cardiovascular;  Laterality: N/A;  . CARDIOVERSION N/A 05/28/2018   Procedure: CARDIOVERSION;  Surgeon: Pixie Casino, MD;  Location: [facility];  Service: Cardiovascular;  Laterality: N/A;  . CARDIOVERSION N/A 10/28/2019   Procedure: CARDIOVERSION;  Surgeon: Sueanne Margarita, MD;  Location[facility];  Service: Cardiovascular;  Laterality: N/A;  . CATARACT EXTRACTION W/ INTRAOCULAR LENS  IMPLANT, BILATERAL Bilateral   . CHEST TUBE INSERTION Right 07/26/2017   Procedure: INSERTION PLEURAL DRAINAGE CATHETER - RIGHT;  Surgeon: Ivin Poot, MD;  Location: [facility];  Service: Thoracic;  Laterality: Right;  . CORONARY ARTERY BYPASS GRAFT  03/10/2008   x 3 Dr. Roxan Hockey  . EP IMPLANTABLE DEVICE N/A 04/22/2015   Procedure: BiV ICD Insertion CRT-D;  Surgeon: [name], MD;  Location: [place] [facility];  Service: Cardiovascular;  Laterality: N/A;  . ESOPHAGOGASTRODUODENOSCOPY (EGD) WITH ESOPHAGEAL DILATION  X1  . ESOPHAGOGASTRODUODENOSCOPY (EGD) WITH PROPOFOL N/A 05/09/2017   Procedure: ESOPHAGOGASTRODUODENOSCOPY (EGD) WITH PROPOFOL;  Surgeon: Carol Ada, MD;  Location: [facility];  Service: Endoscopy;  Laterality: N/A;  . IR THORACENTESIS ASP PLEURAL SPACE W/IMG GUIDE  05/27/2017  . IR THORACENTESIS ASP PLEURAL SPACE W/IMG GUIDE  06/17/2017  . IR THORACENTESIS ASP PLEURAL SPACE W/IMG GUIDE  06/18/2017  . IR THORACENTESIS ASP PLEURAL SPACE W/IMG GUIDE  07/11/2017  . REMOVAL OF PLEURAL DRAINAGE CATHETER Right 09/13/2017   Procedure: REMOVAL OF PLEURAL DRAINAGE CATHETER;  Surgeon: Ivin Poot, MD;  Location: [facility];  Service: Thoracic;  Laterality: Right;  . RIGHT HEART CATH AND CORONARY/GRAFT ANGIOGRAPHY N/A 05/31/2017  Procedure: RIGHT HEART CATH AND CORONARY/GRAFT ANGIOGRAPHY;  Surgeon: Jolaine Artist, MD;  Location: [place] [facility];  Service: Cardiovascular;  Laterality: N/A;  . TONSILLECTOMY      Current Outpatient Medications  Medication Sig Dispense Refill  . acetaminophen (TYLENOL) 500 MG tablet Take 1,000 mg by mouth every 6 (six) hours as needed for moderate pain or headache.    [name] atorvastatin (LIPITOR) 20 MG tablet TAKE 1 TABLET EVERY DAY  AT  6  PM (Patient taking differently: Take 20 mg by mouth daily at 6 PM. ) 90 tablet 2  . carvedilol (COREG) 25 MG tablet TAKE 1 TABLET TWICE DAILY (DOSE INCREASE) (Patient taking differently: Take 25 mg by mouth 2 (two) times daily with a meal. ) 180 tablet 3  . ELIQUIS 2.5 MG TABS tablet TAKE 1 TABLET TWICE DAILY (Patient taking differently: Take 2.5 mg by  mouth 2 (two) times daily. ) 180 tablet 1  . famotidine (PEPCID) 40 MG tablet Take 40 mg by mouth every evening.     . furosemide (LASIX) 40 MG tablet Take 1 tablet (40 mg total) by mouth daily as needed for edema. 30 tablet 11  . hydrALAZINE (APRESOLINE) 25 MG tablet Take 25 mg by mouth 2 (two) times daily.     . isosorbide mononitrate (IMDUR) 30 MG 24 hr tablet TAKE 1 TABLET EVERY DAY (Patient taking differently: Take 30 mg by mouth daily. ) 90 tablet 2  . mexiletine (MEXITIL) 150 MG capsule Take 2 capsules (300 mg total) by mouth every 12 (twelve) hours.    . Multiple Vitamin (MULTIVITAMIN WITH MINERALS) TABS tablet Take 1 tablet by mouth daily.     . Omega-3 Fatty Acids (FISH OIL) 1000 MG CAPS Take 2,000 mg by mouth 2 (two) times daily.    . sotalol (BETAPACE) 80 MG tablet Take 1 tablet (80 mg total) by mouth 2 (two) times daily. 180 tablet 1   No current facility-administered medications for this visit.    Allergies:   Ranolazine, Novocain [procaine], and Amiodarone   Social History:  The patient  reports that he has never smoked. He has never used smokeless tobacco. He reports previous alcohol use. He reports that he does not use drugs.   Family History:  The patient's family history includes Alzheimer's disease in his mother and another family member; COPD in his daughter and sister; Depression in his sister; Esophageal cancer in his sister; Heart disease in his sister; Hyperlipidemia in his sister; Hypertension in his sister.  ROS:  Please see the history of present illness.   All other systems are reviewed and otherwise negative.   PHYSICAL EXAM:  VS:  There were no vitals taken for this visit. BMI: There is no height or weight on file to calculate BMI. Well nourished, well developed, in no acute distress  HEENT: normocephalic, atraumatic  Neck: no JVD, carotid bruits or masses Cardiac:  RRR; no significant murmurs, no rubs, or gallops Lungs:  CTA b/l, no wheezing, rhonchi or  rales  Abd: soft, nontender, MS: no deformity,  age appropriate atrophy Ext:  no edema  Skin: warm and dry, no rash Neuro:  No gross deficits appreciated Psych: euthymic mood, full affect  ICD site is stable, no tethering or discomfort   EKG:  Done today and reviewed by myself: AV paced, manually measured QT 481m, QTc 496m  ICD interrogation done today and reviewed by myself:   Battery and lead measurements are good has had NSVT episodes  No AF since DCCV His activity is improving  09/07/2019: TTE IMPRESSIONS  1. Left ventricular ejection fraction, by estimation, is 30 to 35%. The  left ventricle has moderately decreased function. Left ventricular  endocardial border not optimally defined to evaluate regional wall motion.  Left ventricular diastolic parameters  are consistent with Grade II diastolic dysfunction (pseudonormalization).  Elevated left ventricular end-diastolic pressure.  2. Right ventricular systolic function is moderately reduced. The right  ventricular size is normal. Tricuspid regurgitation signal is inadequate  for assessing PA pressure.  3. Left atrial size was mildly dilated.  4. The mitral valve is abnormal. Trivial mitral valve regurgitation. No  evidence of mitral stenosis.  5. The aortic valve has been repaired/replaced. Aortic valve  regurgitation is not visualized. There is a 23 mm Magna Ease pericardial  valve present in the aortic position. Procedure Date: 03/10/2008. Echo  findings are consistent with normal structure and  function of the aortic valve prosthesis. Aortic valve mean gradient  measures 6.0 mmHg.    05/31/17; R/LHC Assessment: 1. 3v CAD with stable revascularization with patent LIMA-LAD, SVG-diagonal and SVG-RCA 2. 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 Normal cardiac output - no evidence of thyrotoxicosis heart disease or PH  05/27/17: TTE Study Conclusions - Left  ventricle: The cavity size was normal. Wall thickness was   increased in a pattern of mild LVH. Systolic function was   severely reduced. The estimated ejection fraction was in the   range of 20% to 25%. There is akinesis of the inferolateral,   inferior, and inferoseptal myocardium. Features are consistent   with a pseudonormal left ventricular filling pattern, with   concomitant abnormal relaxation and increased filling pressure   (grade 2 diastolic dysfunction). - Aortic valve: A pericardial tissue valve bioprosthesis was   present and functioning normally. Peak velocity (S): 226 cm/s.   Mean gradient (S): 10 mm Hg. Valve area (VTI): 1.52 cm^2. Valve   area (Vmax): 1.5 cm^2. Valve area (Vmean): 1.59 cm^2. - Mitral valve: Calcified annulus. Mildly thickened leaflets .   There was mild regurgitation. - Left atrium: The atrium was severely dilated. - Right ventricle: Pacer wire or catheter noted in right ventricle. - Pulmonary arteries: Systolic pressure was mildly increased. PA   peak pressure: 35 mm Hg (S). Impressions: - Compared to the prior study, there has been no significant   interval change.  Recent Labs: 09/08/2019: ALT 25 10/21/2019: BUN 28; Creatinine, Ser 1.77; Hemoglobin 12.5; Magnesium 2.3; Platelets 307; Potassium 5.0; Sodium 133  No results found for requested labs within last 8760 hours.   CrCl cannot be calculated (Patient's most recent lab result is older than the maximum 21 days allowed.).   Wt Readings from Last 3 Encounters:  10/28/19 188 lb 6.4 oz (85.5 kg)  10/21/19 188 lb 6.4 oz (85.5 kg)  10/01/19 194 lb 6.4 oz (88.2 kg)     Other studies reviewed: Additional studies/records reviewed today include: summarized above  ASSESSMENT AND PLAN:  1. VT 2. ICD      Intact device function      No programming changes made      On mexiletine and sotalol  3. CAD     No anginal complaints     On BB, statin, no ASA w/OAC     C/w Dr. Radford Pax  4. ICM     On  BB, hydralazine, Imdur, and diureticNo on ACE/ARB 2/2 renal insuff  Weight is up some by his home weight     Exam does not suggest volume OL     optivol is up  We discussed increasing his furosemide.  He is very reluctant with problems with dizziness with this. He is not overtly volume OL by exam and only winded walking about 1074f. He has a very specific regime that he does not want to adjust. He assures me his weight will start to come down and he will keep a close watch Discussed we can adjust some other meds to allow better BP, but he would prefer no changes now Discussed having him see AHF team, but he would prefer not to  BMEt today Recommended f/u in 3-4 weeks but he asks that we push that back, he is agreeable to a virtual visit though and perhaps seeing Dr. CCurt Bearsin Oct as scheduled      5. VHD Bioprosthetic AVR    Functioning well by TTE in may  6. Hx of orthostatic hypotension     No symptoms of late      7. Paroxysmal AFib, flutter     CHA2DS2Vasc is 5, on Eliquis,  appropriately dosed for age/renal insuff     Maintaining SR post DCCV        Disposition: as above  Current medicines are reviewed at length with the patient today.  The patient did not have any concerns regarding medicines.  SVenetia Night PA-C 11/16/2019 7:05 PM     [place] [phone](office)  [phone](fax)

## 2019-11-17 ENCOUNTER — Ambulatory Visit (INDEPENDENT_AMBULATORY_CARE_PROVIDER_SITE_OTHER): Payer: Medicare HMO | Admitting: *Deleted

## 2019-11-17 DIAGNOSIS — E1122 Type 2 diabetes mellitus with diabetic chronic kidney disease: Secondary | ICD-10-CM | POA: Diagnosis not present

## 2019-11-17 DIAGNOSIS — K5792 Diverticulitis of intestine, part unspecified, without perforation or abscess without bleeding: Secondary | ICD-10-CM | POA: Diagnosis not present

## 2019-11-17 DIAGNOSIS — D631 Anemia in chronic kidney disease: Secondary | ICD-10-CM | POA: Diagnosis not present

## 2019-11-17 DIAGNOSIS — I951 Orthostatic hypotension: Secondary | ICD-10-CM | POA: Diagnosis not present

## 2019-11-17 DIAGNOSIS — I5042 Chronic combined systolic (congestive) and diastolic (congestive) heart failure: Secondary | ICD-10-CM | POA: Diagnosis not present

## 2019-11-17 DIAGNOSIS — N184 Chronic kidney disease, stage 4 (severe): Secondary | ICD-10-CM | POA: Diagnosis not present

## 2019-11-17 DIAGNOSIS — I130 Hypertensive heart and chronic kidney disease with heart failure and stage 1 through stage 4 chronic kidney disease, or unspecified chronic kidney disease: Secondary | ICD-10-CM | POA: Diagnosis not present

## 2019-11-17 DIAGNOSIS — K5790 Diverticulosis of intestine, part unspecified, without perforation or abscess without bleeding: Secondary | ICD-10-CM | POA: Diagnosis not present

## 2019-11-17 DIAGNOSIS — I472 Ventricular tachycardia, unspecified: Secondary | ICD-10-CM

## 2019-11-17 DIAGNOSIS — I4819 Other persistent atrial fibrillation: Secondary | ICD-10-CM | POA: Diagnosis not present

## 2019-11-18 ENCOUNTER — Ambulatory Visit: Payer: Medicare HMO | Admitting: Physician Assistant

## 2019-11-18 ENCOUNTER — Other Ambulatory Visit: Payer: Self-pay

## 2019-11-18 VITALS — BP 98/56 | HR 64 | Ht 72.0 in | Wt 189.0 lb

## 2019-11-18 DIAGNOSIS — Z79899 Other long term (current) drug therapy: Secondary | ICD-10-CM | POA: Diagnosis not present

## 2019-11-18 DIAGNOSIS — Z95810 Presence of automatic (implantable) cardiac defibrillator: Secondary | ICD-10-CM

## 2019-11-18 DIAGNOSIS — I255 Ischemic cardiomyopathy: Secondary | ICD-10-CM | POA: Diagnosis not present

## 2019-11-18 DIAGNOSIS — I2510 Atherosclerotic heart disease of native coronary artery without angina pectoris: Secondary | ICD-10-CM | POA: Diagnosis not present

## 2019-11-18 DIAGNOSIS — Z952 Presence of prosthetic heart valve: Secondary | ICD-10-CM | POA: Diagnosis not present

## 2019-11-18 DIAGNOSIS — I5022 Chronic systolic (congestive) heart failure: Secondary | ICD-10-CM

## 2019-11-18 DIAGNOSIS — I4819 Other persistent atrial fibrillation: Secondary | ICD-10-CM

## 2019-11-18 DIAGNOSIS — I472 Ventricular tachycardia, unspecified: Secondary | ICD-10-CM

## 2019-11-18 LAB — CUP PACEART REMOTE DEVICE CHECK
Battery Remaining Longevity: 20 mo
Battery Voltage: 2.94 V
Brady Statistic AP VP Percent: 84.14 %
Brady Statistic AP VS Percent: 1.34 %
Brady Statistic AS VP Percent: 13.77 %
Brady Statistic AS VS Percent: 0.75 %
Brady Statistic RA Percent Paced: 81.94 %
Brady Statistic RV Percent Paced: 95.04 %
Date Time Interrogation Session: 20210810194228
HighPow Impedance: 70 Ohm
Implantable Lead Implant Date: 20170113
Implantable Lead Implant Date: 20170113
Implantable Lead Implant Date: 20170113
Implantable Lead Location: 753858
Implantable Lead Location: 753859
Implantable Lead Location: 753860
Implantable Lead Model: 4298
Implantable Lead Model: 5076
Implantable Pulse Generator Implant Date: 20170113
Lead Channel Impedance Value: 323 Ohm
Lead Channel Impedance Value: 399 Ohm
Lead Channel Impedance Value: 399 Ohm
Lead Channel Impedance Value: 399 Ohm
Lead Channel Impedance Value: 399 Ohm
Lead Channel Impedance Value: 456 Ohm
Lead Channel Impedance Value: 456 Ohm
Lead Channel Impedance Value: 513 Ohm
Lead Channel Impedance Value: 722 Ohm
Lead Channel Impedance Value: 760 Ohm
Lead Channel Impedance Value: 760 Ohm
Lead Channel Impedance Value: 760 Ohm
Lead Channel Impedance Value: 817 Ohm
Lead Channel Pacing Threshold Amplitude: 0.5 V
Lead Channel Pacing Threshold Amplitude: 0.75 V
Lead Channel Pacing Threshold Amplitude: 0.75 V
Lead Channel Pacing Threshold Pulse Width: 0.4 ms
Lead Channel Pacing Threshold Pulse Width: 0.4 ms
Lead Channel Pacing Threshold Pulse Width: 0.4 ms
Lead Channel Sensing Intrinsic Amplitude: 1.75 mV
Lead Channel Sensing Intrinsic Amplitude: 1.75 mV
Lead Channel Sensing Intrinsic Amplitude: 7.875 mV
Lead Channel Sensing Intrinsic Amplitude: 7.875 mV
Lead Channel Setting Pacing Amplitude: 1.75 V
Lead Channel Setting Pacing Amplitude: 1.75 V
Lead Channel Setting Pacing Amplitude: 2 V
Lead Channel Setting Pacing Pulse Width: 0.4 ms
Lead Channel Setting Pacing Pulse Width: 0.4 ms
Lead Channel Setting Sensing Sensitivity: 0.3 mV

## 2019-11-18 NOTE — Patient Instructions (Signed)
Medication Instructions:   Your physician recommends that you continue on your current medications as directed. Please refer to the Current Medication list given to you today.  *If you need a refill on your cardiac medications before your next appointment, please call your pharmacy*   Lab Work:  BMET TODAY    If you have labs (blood work) drawn today and your tests are completely normal, you will receive your results only by: Marland Kitchen MyChart Message (if you have MyChart) OR . A paper copy in the mail If you have any lab test that is abnormal or we need to change your treatment, we will call you to review the results.   Testing/Procedures: NONE ORDERED  TODAY   Follow-Up: At [facility], you and your health needs are our priority.  As part of our continuing mission to provide you with exceptional heart care, we have created designated Provider Care Teams.  These Care Teams include your primary Cardiologist (physician) and Advanced Practice Providers (APPs -  Physician Assistants and Nurse Practitioners) who all work together to provide you with the care you need, when you need it.  We recommend signing up for the patient portal called "MyChart".  Sign up information is provided on this After Visit Summary.  MyChart is used to connect with patients for Virtual Visits (Telemedicine).  Patients are able to view lab/test results, encounter notes, upcoming appointments, etc.  Non-urgent messages can be sent to your provider as well.   To learn more about what you can do with MyChart, go to [email or web address].    Your next appointment:   1 month(s)  The format for your next appointment:   In Person  Provider:   You may see [name], MD or one of the following Advanced Practice Providers on your designated Care Team:    Chanetta Marshall, NP  Tommye Standard, PA-C  Legrand Como "Oda Kilts, [place]    Other Instructions

## 2019-11-19 LAB — BASIC METABOLIC PANEL
BUN/Creatinine Ratio: 10 (ref 10–24)
BUN: 17 mg/dL (ref 8–27)
CO2: 22 mmol/L (ref 20–29)
Calcium: 8.8 mg/dL (ref 8.6–10.2)
Chloride: 101 mmol/L (ref 96–106)
Creatinine, Ser: 1.75 mg/dL — ABNORMAL HIGH (ref 0.76–1.27)
GFR calc Af Amer: 40 mL/min/{1.73_m2} — ABNORMAL LOW (ref >59)
GFR calc non Af Amer: 35 mL/min/{1.73_m2} — ABNORMAL LOW (ref >59)
Glucose: 105 mg/dL — ABNORMAL HIGH (ref 65–99)
Potassium: 4.8 mmol/L (ref 3.5–5.2)
Sodium: 137 mmol/L (ref 134–144)

## 2019-11-19 NOTE — Progress Notes (Signed)
Remote ICD transmission.   

## 2019-11-23 DIAGNOSIS — I951 Orthostatic hypotension: Secondary | ICD-10-CM | POA: Diagnosis not present

## 2019-11-23 DIAGNOSIS — D631 Anemia in chronic kidney disease: Secondary | ICD-10-CM | POA: Diagnosis not present

## 2019-11-23 DIAGNOSIS — E1122 Type 2 diabetes mellitus with diabetic chronic kidney disease: Secondary | ICD-10-CM | POA: Diagnosis not present

## 2019-11-23 DIAGNOSIS — K5792 Diverticulitis of intestine, part unspecified, without perforation or abscess without bleeding: Secondary | ICD-10-CM | POA: Diagnosis not present

## 2019-11-23 DIAGNOSIS — I4819 Other persistent atrial fibrillation: Secondary | ICD-10-CM | POA: Diagnosis not present

## 2019-11-23 DIAGNOSIS — N184 Chronic kidney disease, stage 4 (severe): Secondary | ICD-10-CM | POA: Diagnosis not present

## 2019-11-23 DIAGNOSIS — K5790 Diverticulosis of intestine, part unspecified, without perforation or abscess without bleeding: Secondary | ICD-10-CM | POA: Diagnosis not present

## 2019-11-23 DIAGNOSIS — I130 Hypertensive heart and chronic kidney disease with heart failure and stage 1 through stage 4 chronic kidney disease, or unspecified chronic kidney disease: Secondary | ICD-10-CM | POA: Diagnosis not present

## 2019-11-23 DIAGNOSIS — I5042 Chronic combined systolic (congestive) and diastolic (congestive) heart failure: Secondary | ICD-10-CM | POA: Diagnosis not present

## 2019-11-24 DIAGNOSIS — K5790 Diverticulosis of intestine, part unspecified, without perforation or abscess without bleeding: Secondary | ICD-10-CM | POA: Diagnosis not present

## 2019-11-24 DIAGNOSIS — I130 Hypertensive heart and chronic kidney disease with heart failure and stage 1 through stage 4 chronic kidney disease, or unspecified chronic kidney disease: Secondary | ICD-10-CM | POA: Diagnosis not present

## 2019-11-24 DIAGNOSIS — I951 Orthostatic hypotension: Secondary | ICD-10-CM | POA: Diagnosis not present

## 2019-11-24 DIAGNOSIS — I5042 Chronic combined systolic (congestive) and diastolic (congestive) heart failure: Secondary | ICD-10-CM | POA: Diagnosis not present

## 2019-11-24 DIAGNOSIS — E1122 Type 2 diabetes mellitus with diabetic chronic kidney disease: Secondary | ICD-10-CM | POA: Diagnosis not present

## 2019-11-24 DIAGNOSIS — K5792 Diverticulitis of intestine, part unspecified, without perforation or abscess without bleeding: Secondary | ICD-10-CM | POA: Diagnosis not present

## 2019-11-24 DIAGNOSIS — I4819 Other persistent atrial fibrillation: Secondary | ICD-10-CM | POA: Diagnosis not present

## 2019-11-24 DIAGNOSIS — D631 Anemia in chronic kidney disease: Secondary | ICD-10-CM | POA: Diagnosis not present

## 2019-11-24 DIAGNOSIS — N184 Chronic kidney disease, stage 4 (severe): Secondary | ICD-10-CM | POA: Diagnosis not present

## 2019-12-04 DIAGNOSIS — K5790 Diverticulosis of intestine, part unspecified, without perforation or abscess without bleeding: Secondary | ICD-10-CM | POA: Diagnosis not present

## 2019-12-04 DIAGNOSIS — N184 Chronic kidney disease, stage 4 (severe): Secondary | ICD-10-CM | POA: Diagnosis not present

## 2019-12-04 DIAGNOSIS — K5792 Diverticulitis of intestine, part unspecified, without perforation or abscess without bleeding: Secondary | ICD-10-CM | POA: Diagnosis not present

## 2019-12-04 DIAGNOSIS — I951 Orthostatic hypotension: Secondary | ICD-10-CM | POA: Diagnosis not present

## 2019-12-04 DIAGNOSIS — I5042 Chronic combined systolic (congestive) and diastolic (congestive) heart failure: Secondary | ICD-10-CM | POA: Diagnosis not present

## 2019-12-04 DIAGNOSIS — E1122 Type 2 diabetes mellitus with diabetic chronic kidney disease: Secondary | ICD-10-CM | POA: Diagnosis not present

## 2019-12-04 DIAGNOSIS — I4819 Other persistent atrial fibrillation: Secondary | ICD-10-CM | POA: Diagnosis not present

## 2019-12-04 DIAGNOSIS — I130 Hypertensive heart and chronic kidney disease with heart failure and stage 1 through stage 4 chronic kidney disease, or unspecified chronic kidney disease: Secondary | ICD-10-CM | POA: Diagnosis not present

## 2019-12-04 DIAGNOSIS — D631 Anemia in chronic kidney disease: Secondary | ICD-10-CM | POA: Diagnosis not present

## 2019-12-10 DIAGNOSIS — I4819 Other persistent atrial fibrillation: Secondary | ICD-10-CM | POA: Diagnosis not present

## 2019-12-10 DIAGNOSIS — I951 Orthostatic hypotension: Secondary | ICD-10-CM | POA: Diagnosis not present

## 2019-12-10 DIAGNOSIS — D631 Anemia in chronic kidney disease: Secondary | ICD-10-CM | POA: Diagnosis not present

## 2019-12-10 DIAGNOSIS — N184 Chronic kidney disease, stage 4 (severe): Secondary | ICD-10-CM | POA: Diagnosis not present

## 2019-12-10 DIAGNOSIS — I5042 Chronic combined systolic (congestive) and diastolic (congestive) heart failure: Secondary | ICD-10-CM | POA: Diagnosis not present

## 2019-12-10 DIAGNOSIS — I130 Hypertensive heart and chronic kidney disease with heart failure and stage 1 through stage 4 chronic kidney disease, or unspecified chronic kidney disease: Secondary | ICD-10-CM | POA: Diagnosis not present

## 2019-12-10 DIAGNOSIS — K5792 Diverticulitis of intestine, part unspecified, without perforation or abscess without bleeding: Secondary | ICD-10-CM | POA: Diagnosis not present

## 2019-12-10 DIAGNOSIS — E1122 Type 2 diabetes mellitus with diabetic chronic kidney disease: Secondary | ICD-10-CM | POA: Diagnosis not present

## 2019-12-10 DIAGNOSIS — K5790 Diverticulosis of intestine, part unspecified, without perforation or abscess without bleeding: Secondary | ICD-10-CM | POA: Diagnosis not present

## 2019-12-18 DIAGNOSIS — N184 Chronic kidney disease, stage 4 (severe): Secondary | ICD-10-CM | POA: Diagnosis not present

## 2019-12-18 DIAGNOSIS — I951 Orthostatic hypotension: Secondary | ICD-10-CM | POA: Diagnosis not present

## 2019-12-18 DIAGNOSIS — E1122 Type 2 diabetes mellitus with diabetic chronic kidney disease: Secondary | ICD-10-CM | POA: Diagnosis not present

## 2019-12-18 DIAGNOSIS — I130 Hypertensive heart and chronic kidney disease with heart failure and stage 1 through stage 4 chronic kidney disease, or unspecified chronic kidney disease: Secondary | ICD-10-CM | POA: Diagnosis not present

## 2019-12-18 DIAGNOSIS — K5792 Diverticulitis of intestine, part unspecified, without perforation or abscess without bleeding: Secondary | ICD-10-CM | POA: Diagnosis not present

## 2019-12-18 DIAGNOSIS — I4819 Other persistent atrial fibrillation: Secondary | ICD-10-CM | POA: Diagnosis not present

## 2019-12-18 DIAGNOSIS — I5042 Chronic combined systolic (congestive) and diastolic (congestive) heart failure: Secondary | ICD-10-CM | POA: Diagnosis not present

## 2019-12-18 DIAGNOSIS — K5790 Diverticulosis of intestine, part unspecified, without perforation or abscess without bleeding: Secondary | ICD-10-CM | POA: Diagnosis not present

## 2019-12-18 DIAGNOSIS — D631 Anemia in chronic kidney disease: Secondary | ICD-10-CM | POA: Diagnosis not present

## 2019-12-25 NOTE — Progress Notes (Signed)
Virtual Visit via Telephone Note   This visit type was conducted due to national recommendations for restrictions regarding the COVID-19 Pandemic (e.g. social distancing) in an effort to limit this patient's exposure and mitigate transmission in our community.  Due to his co-morbid illnesses, this patient is at least at moderate risk for complications without adequate follow up.  This format is felt to be most appropriate for this patient at this time.  The patient did not have access to video technology/had technical difficulties with video requiring transitioning to audio format only (telephone).  All issues noted in this document were discussed and addressed.  No physical exam could be performed with this format.  Please refer to the patient's chart for his  consent to telehealth for [facility].    Date:  12/28/2019   ID:  [name], DOB [birth year], MRN [id number] The patient was identified using 2 identifiers.  Patient Location: Home Provider Location: Office/Clinic  PCP:  [name], MD  Cardiologist:  [name], MD  AHF: Dr. Haroldine Laws Electrophysiologist:  [name], MD   Evaluation Performed:  Follow-Up Visit  Chief Complaint:  Planned visit  History of Present Illness:    [name] is a 86 y.o. male with CAD/VHD s/p CABG/bioprosthetic AVR in 2009, chronic CHF (systolic), ICM, PAF, orthostatic hypotension, VT, ICD.   March 2019 required right thoras (x2), April another 2L drained Admitted 3/9 ->06/20/17 with recurrent pleural effusion and markedly elevated BNP. Underwent Thoracentesis x 2. Had NSVT up to 40 beats, electrolytes replaced aggressively.  PYP scan 06/20/17 NOT suggestive of TTR amyloid)(and eventually placement of Pleurex catheter (Dr. Nils Pyle) and removed June, CKD (III).  He has had recurrent VT over the years with appropriate therapies both ATP and HV therapy He has also had double tachycardia   09/25/19 Seen by A. Lynnell Jude, NP  2/2 to having been found on remotes to have prolonged episode of VT under detection that eventually accelerated enuough to get HV therapy. On arrival to her visi he was in a double tachycardia and with JA supervision pace terminated the VT though unable to terminate his AFlutter His sotalol was had been increased at a recent hospital stay though for unclear reasons discharged on 44m BID His sotalol was resumed to 854mBID, his VT zone HR was reduced.   He last saw EP service by A. TiZaleskiPAAnita/14/2021, he remained in AF , had mot missed any OAC doses and planned for DCCV > 10/28/19 successful He comes in today for f/u after DCCV.   I saw him 11/18/19 He is doing OK.  Has been slow to recover after his long hospital then rehab stay. This weekend though he felt like he made a bit of improvement, a little more energy in general, but not as much as he would like. No CP or palpitations.  No dizziness, near syncope or syncope. He denies bleeding or signs of bleeding He reports about a 1000 feet before he gets winded, the heat is worse No rest SOB, reports  He is diligent about his weights and furosemide. He reports his dry weight at 182.  This AM he weighed 183.5 He takes furosemide 2051m1/2 tab) every other day and daily for weight gain he reports in d/w Dr. CamCurt Bearsnd this has been what has been working for him) No rest SOB, no symptoms of PND or orthopnea He was AV paced QT stable We discussed increasing his furosemide.  He is very  reluctant with problems with dizziness with this. He is not overtly volume OL by exam and only winded walking about 1017f. He has a very specific regime that he does not want to adjust. He assures me his weight will start to come down and he will keep a close watch Discussed we can adjust some other meds to allow better BP, but he would prefer no changes now Discussed having him see AHF team, but he would prefer not to  BMEt today Recommended f/u in 3-4  weeks but he asks that we push that back, he is agreeable to a virtual visit though and perhaps seeing Dr. CCurt Bearsin Oct as scheduled  TODAY (tele health), he identifies himself by name and birth date He has been doing "fine" Weight is up a few pounds, denies any unsual SOB or DOE, has been working some in the yard and feels like his breathing is at his baseline. He uses the furosemide PRN usually for a few days then none again a few times a month.  He tends to let his diet go on the weekends and thiks that is part of the gain. He denies edema or feeling bloated No CP, palpitations He will occ feel lightheaded upon standing, once his BP read 90SBP, and felt like he got a little dehydrated, has been good since hen. No near syncope or syncope, no shocks No bleeding or signs of bleeding    Device information MDT CRT-D, implanted1/13/17 + hx of numerous appropriate therapies VT   AAD Hx: Amiodarone stopped Jan 2019, 2/2 SOB,  No clear finding of amio tox >> mexiletine started More VT and sotalol was added Dec 2020    Past Medical History:  Diagnosis Date  . Arthritis    "minor everywhere" (04/22/2015)  . Asthma    "a touch"  . Barrett esophagus   . CAD (coronary artery disease)    a. s/p 3 vvessel CABG with LIMA to LAD, SVG to diagonal 1, SVG to RCA 12/09.  . Cardiac resynchronization therapy defibrillator (CRT-D) in place   . Chronic anticoagulation 01/23/2013  . Chronic combined systolic and diastolic CHF (congestive heart failure) (HCC)    dry weight 197-200lbs.  . CKD (chronic kidney disease) stage 3, GFR 30-59 ml/min 08/23/2014  . D[place]) [place]    initially concerned for TTP amyloidosis but Tc-PYP scan 06/2017 was not consistent with amyloid and SPEP/UPEP with no M spike. EF 20-25% by echo 2019  . Diverticulitis 08/2019  . Dyslipidemia 01/23/2013  . Essential hypertension 01/23/2013  . GERD (gastroesophageal reflux disease)   . GI bleed 05/08/2017   . H/O: rheumatic fever   . Hepatitis 1957   "don't know what kind:  . History of hiatal hernia   . History of PFTs    a. Amiodarone started 5/16 >> PFTs w/ DLCO 6/16:  FEV1 72% predicted, FEV1/FVC 66%, DLCO 66% >> minimal reversible obstructive airways disease with mild diffusion defect (suggestive of emphysema but absence of hyperinflation inconsistent with dx)  . Hyperkalemia 08/23/2014  . Hyperthyroidism 05/26/2017  . Leg weakness 01/28/2017  . Multiple thyroid nodules 08/13/2017  . Orthostatic hypotension   . [place]istent atrial fibrillation ([place] 01/23/2013  . Pleural effusion on right 05/26/2017  . Pneumonia 08/2014   "dr thought I may have had a touch"  . Pre-diabetes   . Protein-calorie malnutrition, severe 05/27/2017  . S/P AVR (aortic valve replacement) 2009   a. severe AS s/p AVR with pericardial tissue valve 2009.  .Marland Kitchen  Shingles 08/23/2014  . VT (ventricular tachycardia) ([place]) 05/06/2017   Past Surgical History:  Procedure Laterality Date  . AORTIC VALVE REPLACEMENT  with 23-mm Magna Ease pericardial valve, model number   with 23-mm Magna Ease pericardial valve, model [id number], serial number [id number]   . APPENDECTOMY  1940s  . BI-VENTRICULAR IMPLANTABLE CARDIOVERTER DEFIBRILLATOR  (CRT-D)  04/22/2015  . CARDIAC CATHETERIZATION N/A 11/04/2014   Procedure: Left Heart Cath and Cors/Grafts Angiography;  Surgeon: Leonie Man, MD;  Location: [place] [facility];  Service: Cardiovascular;  Laterality: N/A;  . CARDIAC VALVE REPLACEMENT    . CARDIOVERSION N/A 01/27/2013   Procedure: CARDIOVERSION;  Surgeon: Sueanne Margarita, MD;  Location: [place];  Service: Cardiovascular;  Laterality: N/A;  . CARDIOVERSION N/A 08/25/2014   Procedure: CARDIOVERSION;  Surgeon: Sanda Klein, MD;  Location: [place] ENDOSCOPY;  Service: Cardiovascular;  Laterality: N/A;  . CARDIOVERSION N/A 05/28/2018   Procedure: CARDIOVERSION;  Surgeon: Pixie Casino, MD;  Location[facility] ENDOSCOPY;  Service:  Cardiovascular;  Laterality: N/A;  . CARDIOVERSION N/A 10/28/2019   Procedure: CARDIOVERSION;  Surgeon: Sueanne Margarita, MD;  Location: [facility] ENDOSCOPY;  Service: Cardiovascular;  Laterality: N/A;  . CATARACT EXTRACTION W/ INTRAOCULAR LENS  IMPLANT, BILATERAL Bilateral   . CHEST TUBE INSERTION Right 07/26/2017   Procedure: INSERTION PLEURAL DRAINAGE CATHETER - RIGHT;  Surgeon: Ivin Poot, MD;  Location: [place];  Service: Thoracic;  Laterality: Right;  . CORONARY ARTERY BYPASS GRAFT  03/10/2008   x 3 Dr. Roxan Hockey  . EP IMPLANTABLE DEVICE N/A 04/22/2015   Procedure: BiV ICD Insertion CRT-D;  Surgeon: [name], MD;  Location: [place] [facility];  Service: Cardiovascular;  Laterality: N/A;  . ESOPHAGOGASTRODUODENOSCOPY (EGD) WITH ESOPHAGEAL DILATION  X1  . ESOPHAGOGASTRODUODENOSCOPY (EGD) WITH PROPOFOL N/A 05/09/2017   Procedure: ESOPHAGOGASTRODUODENOSCOPY (EGD) WITH PROPOFOL;  Surgeon: Carol Ada, MD;  Location: [place];  Service: Endoscopy;  Laterality: N/A;  . IR THORACENTESIS ASP PLEURAL SPACE W/IMG GUIDE  05/27/2017  . IR THORACENTESIS ASP PLEURAL SPACE W/IMG GUIDE  06/17/2017  . IR THORACENTESIS ASP PLEURAL SPACE W/IMG GUIDE  06/18/2017  . IR THORACENTESIS ASP PLEURAL SPACE W/IMG GUIDE  07/11/2017  . REMOVAL OF PLEURAL DRAINAGE CATHETER Right 09/13/2017   Procedure: REMOVAL OF PLEURAL DRAINAGE CATHETER;  Surgeon: Ivin Poot, MD;  Location: [place];  Service: Thoracic;  Laterality: Right;  . RIGHT HEART CATH AND CORONARY/GRAFT ANGIOGRAPHY N/A 05/31/2017   Procedure: RIGHT HEART CATH AND CORONARY/GRAFT ANGIOGRAPHY;  Surgeon: Jolaine Artist, MD;  Location: [place] [facility];  Service: Cardiovascular;  Laterality: N/A;  . TONSILLECTOMY       Current Meds  Medication Sig  . acetaminophen (TYLENOL) 500 MG tablet Take 1,000 mg by mouth every 6 (six) hours as needed for moderate pain or headache.  Marland Kitchen atorvastatin (LIPITOR) 20 MG tablet TAKE 1 TABLET EVERY DAY  AT  6  PM  (Patient taking differently: Take 20 mg by mouth daily at 6 PM. )  . carvedilol (COREG) 25 MG tablet TAKE 1 TABLET TWICE DAILY (DOSE INCREASE) (Patient taking differently: Take 25 mg by mouth 2 (two) times daily with a meal. )  . ELIQUIS 2.5 MG TABS tablet TAKE 1 TABLET TWICE DAILY (Patient taking differently: Take 2.5 mg by mouth 2 (two) times daily. )  . famotidine (PEPCID) 40 MG tablet Take 40 mg by mouth every evening.   . furosemide (LASIX) 40 MG tablet Take 1 tablet (40 mg total) by mouth daily as  needed for edema.  . hydrALAZINE (APRESOLINE) 25 MG tablet Take 12.5 mg by mouth 2 (two) times daily.   . isosorbide mononitrate (IMDUR) 30 MG 24 hr tablet TAKE 1 TABLET EVERY DAY (Patient taking differently: Take 30 mg by mouth daily. )  . mexiletine (MEXITIL) 150 MG capsule Take 2 capsules (300 mg total) by mouth every 12 (twelve) hours.  . Multiple Vitamin (MULTIVITAMIN WITH MINERALS) TABS tablet Take 1 tablet by mouth daily.   . Omega-3 Fatty Acids (FISH OIL) 1000 MG CAPS Take 2,000 mg by mouth 2 (two) times daily.  . pantoprazole (PROTONIX) 40 MG tablet Take 40 mg by mouth daily.  . sotalol (BETAPACE) 80 MG tablet Take 1 tablet (80 mg total) by mouth 2 (two) times daily.     Allergies:   Ranolazine, Novocain [procaine], and Amiodarone   Social History   Tobacco Use  . Smoking status: Never Smoker  . Smokeless tobacco: Never Used  Vaping Use  . Vaping Use: Never used  Substance Use Topics  . Alcohol use: Not Currently    Comment: 04/22/2015 "nothing since the 1980s; never had a problem w/it"  . Drug use: No     Family Hx: The patient's family history includes Alzheimer's disease in his mother and another family member; COPD in his daughter and sister; Depression in his sister; Esophageal cancer in his sister; Heart disease in his sister; Hyperlipidemia in his sister; Hypertension in his sister.  ROS:   Please see the history of present illness.    All other systems reviewed and  are negative.   Prior CV studies:   The following studies were reviewed today:  ICD interrogation done 11/18/19:   Battery and lead measurements are good has had NSVT episodes No AF since DCCV His activity is improving   09/07/2019: TTE IMPRESSIONS  1. Left ventricular ejection fraction, by estimation, is 30 to 35%. The  left ventricle has moderately decreased function. Left ventricular  endocardial border not optimally defined to evaluate regional wall motion.  Left ventricular diastolic parameters  are consistent with Grade II diastolic dysfunction (pseudonormalization).  Elevated left ventricular end-diastolic pressure.  2. Right ventricular systolic function is moderately reduced. The right  ventricular size is normal. Tricuspid regurgitation signal is inadequate  for assessing PA pressure.  3. Left atrial size was mildly dilated.  4. The mitral valve is abnormal. Trivial mitral valve regurgitation. No  evidence of mitral stenosis.  5. The aortic valve has been repaired/replaced. Aortic valve  regurgitation is not visualized. There is a 23 mm Magna Ease pericardial  valve present in the aortic position. Procedure Date: 03/10/2008. Echo  findings are consistent with normal structure and  function of the aortic valve prosthesis. Aortic valve mean gradient  measures 6.0 mmHg.    05/31/17; R/LHC Assessment: 1. 3v CAD with stable revascularization with patent LIMA-LAD, SVG-diagonal and SVG-RCA 2. 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 Normal cardiac output - no evidence of thyrotoxicosis heart disease or PH  05/27/17: TTE Study Conclusions - Left ventricle: The cavity size was normal. Wall thickness was increased in a pattern of mild LVH. Systolic function was severely reduced. The estimated ejection fraction was in the range of 20% to 25%. There is akinesis of the inferolateral, inferior, and  inferoseptal myocardium. Features are consistent with a pseudonormal left ventricular filling pattern, with concomitant abnormal relaxation and increased filling pressure (grade 2 diastolic dysfunction). - Aortic valve:  A pericardial tissue valve bioprosthesis was present and functioning normally. Peak velocity (S): 226 cm/s. Mean gradient (S): 10 mm Hg. Valve area (VTI): 1.52 cm^2. Valve area (Vmax): 1.5 cm^2. Valve area (Vmean): 1.59 cm^2. - Mitral valve: Calcified annulus. Mildly thickened leaflets . There was mild regurgitation. - Left atrium: The atrium was severely dilated. - Right ventricle: Pacer wire or catheter noted in right ventricle. - Pulmonary arteries: Systolic pressure was mildly increased. PA peak pressure: 35 mm Hg (S). Impressions: - Compared to the prior study, there has been no significant interval change.  Labs/Other Tests and Data Reviewed:    EKG:  11/18/19: AV paced, manually measured QT 459m, QTc 4928m Recent Labs: 09/08/2019: ALT 25 10/21/2019: Hemoglobin 12.5; Magnesium 2.3; Platelets 307 11/18/2019: BUN 17; Creatinine, Ser 1.75; Potassium 4.8; Sodium 137   Recent Lipid Panel Lab Results  Component Value Date/Time   CHOL 126 07/30/2016 11:43 AM   CHOL 93 (L) 06/16/2014 09:14 AM   TRIG 154 (H) 07/30/2016 11:43 AM   TRIG 124 06/16/2014 09:14 AM   HDL 45 07/30/2016 11:43 AM   HDL 34 (L) 06/16/2014 09:14 AM   CHOLHDL 2.8 07/30/2016 11:43 AM   CHOLHDL 2.7 01/19/2015 10:05 AM   LDLCALC 50 07/30/2016 11:43 AM   LDLCALC 34 06/16/2014 09:14 AM    Wt Readings from Last 3 Encounters:  12/28/19 188 lb (85.3 kg)  11/18/19 189 lb (85.7 kg)  10/28/19 188 lb 6.4 oz (85.5 kg)     Objective:    Vital Signs:  BP 123/74   Pulse 89   Ht 6' (1.829 m)   Wt 188 lb (85.3 kg)   SpO2 95%   BMI 25.50 kg/m    He is speaking in full sentences with normal speech pattern.  He does not sound SOB on the phone  ASSESSMENT & PLAN:     1. VT 2.  ICD      Intact device function noted at his last visit, no programming changes were made      On mexiletine and sotalol      No syncope or shocks  3. CAD     No anginal complaints     On BB, statin, no ASA w/OAC     C/w Dr. TuRadford Pax4. ICM     On BB, hydralazine, Imdur, and diuretic     No on ACE/ARB 2/2 renal insuff     Weight is up he manages this with PRN lasix, and is urged to call if his PRN lasix does not get him back to his baseline or if he develops any symptoms He will keep his appt with Dr. CaCurt Bearsext month           5. VHD Bioprosthetic AVR    Functioning well by TTE in May  6. Hx of orthostatic hypotension     discussed today, importance of balance between staying adequately hydrated and not getting volume OL as well.     He seems to be very in-tune with this,      7. Paroxysmal AFib, flutter     CHA2DS2Vasc is 5, on Eliquis,  appropriately dosed for age/renal insuff     Maintaining SR post DCCV July 2021 by his last visit/device interrogation      COVID-19 Education: The signs and symptoms of COVID-19 were discussed with the patient and how to seek care for testing (follow up with PCP or arrange E-visit).  The importance of social distancing was discussed today.  Time:   Today, I have spent 7 minutes with the patient with telehealth technology discussing the above problems.     Medication Adjustments/Labs and Tests Ordered: Current medicines are reviewed at length with the patient today.  Concerns regarding medicines are outlined above.   Tests Ordered: No orders of the defined types were placed in this encounter.   Medication Changes: No orders of the defined types were placed in this encounter.   Follow Up:  In Person as scheduled  Signed, Baldwin Jamaica, PA-C  12/28/2019 11:41 AM    [place]

## 2019-12-28 ENCOUNTER — Telehealth (INDEPENDENT_AMBULATORY_CARE_PROVIDER_SITE_OTHER): Payer: Medicare HMO | Admitting: Physician Assistant

## 2019-12-28 ENCOUNTER — Other Ambulatory Visit: Payer: Self-pay

## 2019-12-28 VITALS — BP 123/74 | HR 89 | Ht 72.0 in | Wt 188.0 lb

## 2019-12-28 DIAGNOSIS — I2510 Atherosclerotic heart disease of native coronary artery without angina pectoris: Secondary | ICD-10-CM | POA: Diagnosis not present

## 2019-12-28 DIAGNOSIS — I255 Ischemic cardiomyopathy: Secondary | ICD-10-CM | POA: Diagnosis not present

## 2019-12-28 DIAGNOSIS — I472 Ventricular tachycardia, unspecified: Secondary | ICD-10-CM

## 2019-12-28 DIAGNOSIS — I480 Paroxysmal atrial fibrillation: Secondary | ICD-10-CM | POA: Diagnosis not present

## 2019-12-28 DIAGNOSIS — Z952 Presence of prosthetic heart valve: Secondary | ICD-10-CM

## 2019-12-28 DIAGNOSIS — I5022 Chronic systolic (congestive) heart failure: Secondary | ICD-10-CM

## 2019-12-28 DIAGNOSIS — Z95810 Presence of automatic (implantable) cardiac defibrillator: Secondary | ICD-10-CM | POA: Diagnosis not present

## 2019-12-31 ENCOUNTER — Other Ambulatory Visit: Payer: Self-pay

## 2019-12-31 MED ORDER — CARVEDILOL 25 MG PO TABS: ORAL_TABLET | ORAL | 3 refills | Status: DC

## 2020-01-12 ENCOUNTER — Ambulatory Visit: Payer: Medicare HMO | Admitting: Cardiology

## 2020-01-12 ENCOUNTER — Other Ambulatory Visit: Payer: Self-pay

## 2020-01-12 ENCOUNTER — Encounter: Payer: Self-pay | Admitting: Cardiology

## 2020-01-12 VITALS — BP 118/68 | HR 66 | Ht 72.0 in | Wt 192.4 lb

## 2020-01-12 DIAGNOSIS — I255 Ischemic cardiomyopathy: Secondary | ICD-10-CM | POA: Diagnosis not present

## 2020-01-12 NOTE — Progress Notes (Signed)
Electrophysiology Office Note   Date:  01/12/2020   ID:  [name], DOB 22-Mar-1933, MRN [id number]  PCP:  [name], MD  Cardiologist:  [name] Primary Electrophysiologist: Kyrus Hyde Meredith Leeds, MD    No chief complaint on file.    History of Present Illness: [name] is a 86 y.o. male who presents today for electrophysiology evaluation.     He has a history of hypertension, aortic stenosis status post AVR, chronic combined systolic and diastolic heart failure due to ischemic cardiomyopathy, persistent atrial fibrillation/flutter, coronary artery disease status post CABG, and is status post AVR.  He has a Medtronic CRT-D with an upgrade 04/22/2015.  He has had quite a bit of ventricular tachycardia and is now currently on both sotalol and mexiletine.  He has had multiple episodes of ventricular tachycardia as well as atrial fibrillation/flutter requiring both ICD discharges and cardioversion.  Today, denies symptoms of palpitations, chest pain, shortness of breath, orthopnea, PND, lower extremity edema, claudication, dizziness, presyncope, syncope, bleeding, or neurologic sequela. The patient is tolerating medications without difficulties.  He was having some shortness of breath and fatigue up until the last week.  Since last week he has done much better.  Device interrogation shows an elevated OptiVol that improved towards the end of last week.  Aside from that, he has no major complaints today.  Past Medical History:  Diagnosis Date  . Arthritis    "minor everywhere" (04/22/2015)  . Asthma    "a touch"  . Barrett esophagus   . CAD (coronary artery disease)    a. s/p 3 vvessel CABG with LIMA to LAD, SVG to diagonal 1, SVG to RCA 12/09.  . Cardiac resynchronization therapy defibrillator (CRT-D) in place   . Chronic anticoagulation 01/23/2013  . Chronic combined systolic and diastolic CHF (congestive heart failure) (HCC)    dry weight 197-200lbs.  . CKD  (chronic kidney disease) stage 3, GFR 30-59 ml/min (HCC) 08/23/2014  . DCM (dilated cardiomyopathy) ([place])    initially concerned for TTP amyloidosis but Tc-PYP scan 06/2017 was not consistent with amyloid and SPEP/UPEP with no M spike. EF 20-25% by echo 2019  . Diverticulitis 08/2019  . Dyslipidemia 01/23/2013  . Essential hypertension 01/23/2013  . GERD (gastroesophageal reflux disease)   . GI bleed 05/08/2017  . H/O: rheumatic fever   . Hepatitis 1957   "don't know what kind:  . History of hiatal hernia   . History of PFTs    a. Amiodarone started 5/16 >> PFTs w/ DLCO 6/16:  FEV1 72% predicted, FEV1/FVC 66%, DLCO 66% >> minimal reversible obstructive airways disease with mild diffusion defect (suggestive of emphysema but absence of hyperinflation inconsistent with dx)  . Hyperkalemia 08/23/2014  . Hyperthyroidism 05/26/2017  . Leg weakness 01/28/2017  . Multiple thyroid nodules 08/13/2017  . Orthostatic hypotension   . Persistent atrial fibrillation ([place]) 01/23/2013  . Pleural effusion on right 05/26/2017  . Pneumonia 08/2014   "dr thought I may have had a touch"  . Pre-diabetes   . Protein-calorie malnutrition, severe 05/27/2017  . S/P AVR (aortic valve replacement) 2009   a. severe AS s/p AVR with pericardial tissue valve 2009.  Marland Kitchen Shingles 08/23/2014  . VT (ventricular tachycardia) ([place]) 05/06/2017   Past Surgical History:  Procedure Laterality Date  . AORTIC VALVE REPLACEMENT  with 23-mm Magna Ease pericardial valve, model number   with 23-mm Magna Ease pericardial valve, model [id number], serial number [id number]   . APPENDECTOMY  1940s  .  BI-VENTRICULAR IMPLANTABLE CARDIOVERTER DEFIBRILLATOR  (CRT-D)  04/22/2015  . CARDIAC CATHETERIZATION N/A 11/04/2014   Procedure: Left Heart Cath and Cors/Grafts Angiography;  Surgeon: Leonie Man, MD;  Location: [place] [facility];  Service: Cardiovascular;  Laterality: N/A;  . CARDIAC VALVE REPLACEMENT    . CARDIOVERSION N/A 01/27/2013    Procedure: CARDIOVERSION;  Surgeon: Sueanne Margarita, MD;  Location: [facility];  Service: Cardiovascular;  Laterality: N/A;  . CARDIOVERSION N/A 08/25/2014   Procedure: CARDIOVERSION;  Surgeon: Sanda Klein, MD;  Location: [facility];  Service: Cardiovascular;  Laterality: N/A;  . CARDIOVERSION N/A 05/28/2018   Procedure: CARDIOVERSION;  Surgeon: Pixie Casino, MD;  Location: [facility] ENDOSCOPY;  Service: Cardiovascular;  Laterality: N/A;  . CARDIOVERSION N/A 10/28/2019   Procedure: CARDIOVERSION;  Surgeon: Sueanne Margarita, MD;  Location: [facility] ENDOSCOPY;  Service: Cardiovascular;  Laterality: N/A;  . CATARACT EXTRACTION W/ INTRAOCULAR LENS  IMPLANT, BILATERAL Bilateral   . CHEST TUBE INSERTION Right 07/26/2017   Procedure: INSERTION PLEURAL DRAINAGE CATHETER - RIGHT;  Surgeon: Ivin Poot, MD;  Location: [place];  Service: Thoracic;  Laterality: Right;  . CORONARY ARTERY BYPASS GRAFT  03/10/2008   x 3 Dr. Roxan Hockey  . EP IMPLANTABLE DEVICE N/A 04/22/2015   Procedure: BiV ICD Insertion CRT-D;  Surgeon: Lissandro Dilorenzo Meredith Leeds, MD;  Location: [place] [facility];  Service: Cardiovascular;  Laterality: N/A;  . ESOPHAGOGASTRODUODENOSCOPY (EGD) WITH ESOPHAGEAL DILATION  X1  . ESOPHAGOGASTRODUODENOSCOPY (EGD) WITH PROPOFOL N/A 05/09/2017   Procedure: ESOPHAGOGASTRODUODENOSCOPY (EGD) WITH PROPOFOL;  Surgeon: Carol Ada, MD;  Location: [place];  Service: Endoscopy;  Laterality: N/A;  . IR THORACENTESIS ASP PLEURAL SPACE W/IMG GUIDE  05/27/2017  . IR THORACENTESIS ASP PLEURAL SPACE W/IMG GUIDE  06/17/2017  . IR THORACENTESIS ASP PLEURAL SPACE W/IMG GUIDE  06/18/2017  . IR THORACENTESIS ASP PLEURAL SPACE W/IMG GUIDE  07/11/2017  . REMOVAL OF PLEURAL DRAINAGE CATHETER Right 09/13/2017   Procedure: REMOVAL OF PLEURAL DRAINAGE CATHETER;  Surgeon: Ivin Poot, MD;  Location: [place] [facility];  Service: Thoracic;  Laterality: Right;  . RIGHT HEART CATH AND CORONARY/GRAFT ANGIOGRAPHY N/A 05/31/2017   Procedure: RIGHT  HEART CATH AND CORONARY/GRAFT ANGIOGRAPHY;  Surgeon: Jolaine Artist, MD;  Location: [facility];  Service: Cardiovascular;  Laterality: N/A;  . TONSILLECTOMY       Current Outpatient Medications  Medication Sig Dispense Refill  . acetaminophen (TYLENOL) 500 MG tablet Take 1,000 mg by mouth every 6 (six) hours as needed for moderate pain or headache.    Marland Kitchen atorvastatin (LIPITOR) 20 MG tablet TAKE 1 TABLET EVERY DAY  AT  6  PM (Patient taking differently: Take 20 mg by mouth daily at 6 PM. ) 90 tablet 2  . carvedilol (COREG) 25 MG tablet TAKE 1 TABLET TWICE DAILY (DOSE INCREASE) 180 tablet 3  . ELIQUIS 2.5 MG TABS tablet TAKE 1 TABLET TWICE DAILY (Patient taking differently: Take 2.5 mg by mouth 2 (two) times daily. ) 180 tablet 1  . famotidine (PEPCID) 40 MG tablet Take 40 mg by mouth every evening.     . furosemide (LASIX) 40 MG tablet Take 1 tablet (40 mg total) by mouth daily as needed for edema. 30 tablet 11  . hydrALAZINE (APRESOLINE) 25 MG tablet Take 12.5 mg by mouth 2 (two) times daily.     . isosorbide mononitrate (IMDUR) 30 MG 24 hr tablet TAKE 1 TABLET EVERY DAY (Patient taking differently: Take 30 mg by mouth daily. ) 90 tablet 2  .  mexiletine (MEXITIL) 150 MG capsule Take 2 capsules (300 mg total) by mouth every 12 (twelve) hours.    . Multiple Vitamin (MULTIVITAMIN WITH MINERALS) TABS tablet Take 1 tablet by mouth daily.     . Omega-3 Fatty Acids (FISH OIL) 1000 MG CAPS Take 2,000 mg by mouth 2 (two) times daily.    . pantoprazole (PROTONIX) 40 MG tablet Take 40 mg by mouth daily.    . sotalol (BETAPACE) 80 MG tablet Take 1 tablet (80 mg total) by mouth 2 (two) times daily. 180 tablet 1   No current facility-administered medications for this visit.    Allergies:   Ranolazine, Novocain [procaine], and Amiodarone   Social History:  The patient  reports that he has never smoked. He has never used smokeless tobacco. He reports previous alcohol use. He reports that he  does not use drugs.   Family History:  The patient's family history includes Alzheimer's disease in his mother and another family member; COPD in his daughter and sister; Depression in his sister; Esophageal cancer in his sister; Heart disease in his sister; Hyperlipidemia in his sister; Hypertension in his sister.   ROS:  Please see the history of present illness.   Otherwise, review of systems is positive for none.   All other systems are reviewed and negative.   PHYSICAL EXAM: VS:  BP 118/68   Pulse 66   Ht 6' (1.829 m)   Wt 192 lb 6.4 oz (87.3 kg)   SpO2 96%   BMI 26.09 kg/m  , BMI Body mass index is 26.09 kg/m. GEN: Well nourished, well developed, in no acute distress  HEENT: normal  Neck: no JVD, carotid bruits, or masses Cardiac: RRR; no murmurs, rubs, or gallops,no edema  Respiratory:  clear to auscultation bilaterally, normal work of breathing GI: soft, nontender, nondistended, + BS MS: no deformity or atrophy  Skin: warm and dry, device site well healed Neuro:  Strength and sensation are intact Psych: euthymic mood, full affect  EKG:  EKG is ordered today. Personal review of the ekg ordered shows sinus rhythm, ventricular paced, PVCs  Personal review of the device interrogation today. Results in [facility]: 09/08/2019: ALT 25 10/21/2019: Hemoglobin 12.5; Magnesium 2.3; Platelets 307 11/18/2019: BUN 17; Creatinine, Ser 1.75; Potassium 4.8; Sodium 137    Lipid Panel     Component Value Date/Time   CHOL 126 07/30/2016 1143   CHOL 93 (L) 06/16/2014 0914   TRIG 154 (H) 07/30/2016 1143   TRIG 124 06/16/2014 0914   HDL 45 07/30/2016 1143   HDL 34 (L) 06/16/2014 0914   CHOLHDL 2.8 07/30/2016 1143   CHOLHDL 2.7 01/19/2015 1005   VLDL 32 (H) 01/19/2015 1005   LDLCALC 50 07/30/2016 1143   LDLCALC 34 06/16/2014 0914     Wt Readings from Last 3 Encounters:  01/12/20 192 lb 6.4 oz (87.3 kg)  12/28/19 188 lb (85.3 kg)  11/18/19 189 lb (85.7 kg)       Other studies Reviewed: Additional studies/ records that were reviewed today include: TTE 05/29/17 Review of the above records today demonstrates:  - Left ventricle: The cavity size was normal. Wall thickness was   increased in a pattern of mild LVH. Systolic function was   severely reduced. The estimated ejection fraction was in the   range of 20% to 25%. There is akinesis of the inferolateral,   inferior, and inferoseptal myocardium. Features are consistent   with a pseudonormal left ventricular filling pattern,  with   concomitant abnormal relaxation and increased filling pressure   (grade 2 diastolic dysfunction). - Aortic valve: A pericardial tissue valve bioprosthesis was   present and functioning normally. Peak velocity (S): 226 cm/s.   Mean gradient (S): 10 mm Hg. Valve area (VTI): 1.52 cm^2. Valve   area (Vmax): 1.5 cm^2. Valve area (Vmean): 1.59 cm^2. - Mitral valve: Calcified annulus. Mildly thickened leaflets .   There was mild regurgitation. - Left atrium: The atrium was severely dilated. - Right ventricle: Pacer wire or catheter noted in right ventricle. - Pulmonary arteries: Systolic pressure was mildly increased. PA   peak pressure: 35 mm Hg (S).  RHC/LHC 05/31/17  Prox LAD to Mid LAD lesion is 50% stenosed.  Mid LAD to Dist LAD lesion is 25% stenosed.  1st Mrg lesion is 50% stenosed.  Prox RCA to Dist RCA lesion is 100% stenosed.  And is large.  Prox Graft to Mid Graft lesion is 30% stenosed.  And is large.  The flow in the graft is reversed.  There is competitive flow.  And is large.  There is competitive flow.  Ost 1st Diag to 1st Diag lesion is 90% stenosed.   Findings:  Ao = 145/67 (99)  RA = 6 RV = 70/9 PA = 69/30 (46) PCW = 26 v = 37 Fick cardiac output/index =4.7/2.3 PVR = 4.3 FA sat = 98% PA sat = 63%, 60%  Assessment:  1. 3v CAD with stable revascularization with patent LIMA-LAD, SVG-diagonal and SVG-RCA 2. Moderate PH due  to mixture of pulmonary venous and pulmonary arterial HTN (WHO group 2 & 3) 3. Normal cardiac output - no evidence of thyrotoxicosis heart disease or PH   ASSESSMENT AND PLAN:  1.  Chronic combined systolic and diastolic heart failure due to ischemic cardiomyopathy: Status post Medtronic CRT-D implanted 04/22/2015.  Device functioning appropriately.  He is currently on optimal medical therapy with carvedilol, hydralazine, Imdur.  His biventricular pacing was reduced likely due to an elevated PVC burden.  We have increased his pacing rate to 75 bpm.  He also had an elevated OptiVol and had taken increased doses of Lasix.  OptiVol has returned back to normal.  2.  Persistent atrial fibrillation/atrial flutter: Currently on Eliquis with a CHA2DS2-VASc of 5.  Had a recent cardioversion July 2021.  Has remained in sinus rhythm since July.  3.  Ventricular tachycardia: Currently on sotalol and mexiletine (ECG monitoring for high risk medication).  ECG monitoring performed for high risk medications.  He does not have many other medication options due to his comorbidities.  No further episodes  4.  Coronary artery disease status post CABG: No current chest pain.  Current medicines are reviewed at length with the patient today.   The patient does not have concerns regarding his medicines.  The following changes were made today: None  Labs/ tests ordered today include:  Orders Placed This Encounter  Procedures  . EKG 12-Lead    Disposition:   FU with Saleem Coccia 6 months  Signed, all of Arista Kettlewell Meredith Leeds, MD  01/12/2020 2:19 PM     [facility] [address] [place] [address] [phone] (office) [phone] (fax)

## 2020-01-12 NOTE — Patient Instructions (Signed)
Medication Instructions:  Your physician recommends that you continue on your current medications as directed. Please refer to the Current Medication list given to you today.  *If you need a refill on your cardiac medications before your next appointment, please call your pharmacy*   Lab Work: None ordered   Testing/Procedures: None ordered   Follow-Up: At [facility], you and your health needs are our priority.  As part of our continuing mission to provide you with exceptional heart care, we have created designated Provider Care Teams.  These Care Teams include your primary Cardiologist (physician) and Advanced Practice Providers (APPs -  Physician Assistants and Nurse Practitioners) who all work together to provide you with the care you need, when you need it.  Remote monitoring is used to monitor your Pacemaker or ICD from home. This monitoring reduces the number of office visits required to check your device to one time per year. It allows [place] to keep an eye on the functioning of your device to ensure it is working properly. You are scheduled for a device check from home on 02/16/20. You may send your transmission at any time that day. If you have a wireless device, the transmission will be sent automatically. After your physician reviews your transmission, you will receive a postcard with your next transmission date.  Your next appointment:   6 month(s)  The format for your next appointment:   In Person  Provider:   Allegra Lai, MD   Thank you for choosing [facility]!!   Trinidad Curet, RN [phone]

## 2020-01-26 DIAGNOSIS — Z23 Encounter for immunization: Secondary | ICD-10-CM | POA: Diagnosis not present

## 2020-02-04 DIAGNOSIS — N184 Chronic kidney disease, stage 4 (severe): Secondary | ICD-10-CM | POA: Diagnosis not present

## 2020-02-11 DIAGNOSIS — K2270 Barrett's esophagus without dysplasia: Secondary | ICD-10-CM | POA: Diagnosis not present

## 2020-02-11 DIAGNOSIS — E875 Hyperkalemia: Secondary | ICD-10-CM | POA: Diagnosis not present

## 2020-02-11 DIAGNOSIS — E1122 Type 2 diabetes mellitus with diabetic chronic kidney disease: Secondary | ICD-10-CM | POA: Diagnosis not present

## 2020-02-11 DIAGNOSIS — I129 Hypertensive chronic kidney disease with stage 1 through stage 4 chronic kidney disease, or unspecified chronic kidney disease: Secondary | ICD-10-CM | POA: Diagnosis not present

## 2020-02-11 DIAGNOSIS — N184 Chronic kidney disease, stage 4 (severe): Secondary | ICD-10-CM | POA: Diagnosis not present

## 2020-02-11 DIAGNOSIS — I5022 Chronic systolic (congestive) heart failure: Secondary | ICD-10-CM | POA: Diagnosis not present

## 2020-02-16 ENCOUNTER — Ambulatory Visit (INDEPENDENT_AMBULATORY_CARE_PROVIDER_SITE_OTHER): Payer: Medicare HMO

## 2020-02-16 DIAGNOSIS — I255 Ischemic cardiomyopathy: Secondary | ICD-10-CM

## 2020-02-16 LAB — CUP PACEART REMOTE DEVICE CHECK
Battery Remaining Longevity: 25 mo
Battery Voltage: 2.93 V
Brady Statistic AP VP Percent: 97.01 %
Brady Statistic AP VS Percent: 2.74 %
Brady Statistic AS VP Percent: 0.22 %
Brady Statistic AS VS Percent: 0.02 %
Brady Statistic RA Percent Paced: 97.46 %
Brady Statistic RV Percent Paced: 95.51 %
Date Time Interrogation Session: 20211109022606
HighPow Impedance: 67 Ohm
Implantable Lead Implant Date: 20170113
Implantable Lead Implant Date: 20170113
Implantable Lead Implant Date: 20170113
Implantable Lead Location: 753858
Implantable Lead Location: 753859
Implantable Lead Location: 753860
Implantable Lead Model: 4298
Implantable Lead Model: 5076
Implantable Pulse Generator Implant Date: 20170113
Lead Channel Impedance Value: 399 Ohm
Lead Channel Impedance Value: 399 Ohm
Lead Channel Impedance Value: 399 Ohm
Lead Channel Impedance Value: 437 Ohm
Lead Channel Impedance Value: 437 Ohm
Lead Channel Impedance Value: 456 Ohm
Lead Channel Impedance Value: 513 Ohm
Lead Channel Impedance Value: 532 Ohm
Lead Channel Impedance Value: 760 Ohm
Lead Channel Impedance Value: 760 Ohm
Lead Channel Impedance Value: 836 Ohm
Lead Channel Impedance Value: 855 Ohm
Lead Channel Impedance Value: 912 Ohm
Lead Channel Pacing Threshold Amplitude: 0.375 V
Lead Channel Pacing Threshold Amplitude: 0.625 V
Lead Channel Pacing Threshold Amplitude: 0.75 V
Lead Channel Pacing Threshold Pulse Width: 0.4 ms
Lead Channel Pacing Threshold Pulse Width: 0.4 ms
Lead Channel Pacing Threshold Pulse Width: 0.4 ms
Lead Channel Sensing Intrinsic Amplitude: 1.125 mV
Lead Channel Sensing Intrinsic Amplitude: 1.125 mV
Lead Channel Sensing Intrinsic Amplitude: 8.375 mV
Lead Channel Sensing Intrinsic Amplitude: 8.375 mV
Lead Channel Setting Pacing Amplitude: 1.5 V
Lead Channel Setting Pacing Amplitude: 1.75 V
Lead Channel Setting Pacing Amplitude: 2 V
Lead Channel Setting Pacing Pulse Width: 0.4 ms
Lead Channel Setting Pacing Pulse Width: 0.4 ms
Lead Channel Setting Sensing Sensitivity: 0.3 mV

## 2020-02-17 ENCOUNTER — Telehealth: Payer: Self-pay

## 2020-02-17 NOTE — Telephone Encounter (Signed)
Scheduled remote transmission received- 2 VT episodes, both rates 140-150's terminated with ATP x 1. 223 NSVT episodes, all appear to be short, however frequent the past 3 days (see cardiac compass). Routing for further review. [place]- Eliquis, on Sotalol and Mexiletine  Treated VT episodes occurring 10/9 and 10/12- both VT successfully treated with 1 round of ATP.  Noted recent increase in NSVT episodes.    Current meds include - Carvedilol 73m BID, Mexiletine 1568mBID, Sotalol 8051mID, Eliquis 2.5mg27mD  Pt seen in office on 10/5 with reprogramming of device for decreased BiV pacing per notes.    Attempted to reach pt to determine if symptomatic with recent increase in NSVT episodes and medication compliance.  LVM for pt/ family to return call (DPR on file)

## 2020-02-18 NOTE — Progress Notes (Signed)
Remote ICD transmission.   

## 2020-02-24 NOTE — Telephone Encounter (Signed)
Certified letter sent 

## 2020-02-24 NOTE — Telephone Encounter (Signed)
Called [place] (DPR), unable to leave VM.   Will send certified letter.

## 2020-02-24 NOTE — Telephone Encounter (Signed)
Called patient, no answer and unable to leave message.

## 2020-03-07 ENCOUNTER — Telehealth: Payer: Self-pay

## 2020-03-07 NOTE — Telephone Encounter (Signed)
Spoke with pt see, phone encounter 02/17/20 for details.

## 2020-03-07 NOTE — Telephone Encounter (Signed)
Pt responded to certified letter received and provided updated phone number.    Advised pt of events occurring in October.  He is not aware of any symptoms that may have occurred.  He also confirms compliance with meds as ordered.

## 2020-03-07 NOTE — Telephone Encounter (Signed)
Patient has finally called device clinic back after several attempts. Patient was sent a certified letter

## 2020-04-12 ENCOUNTER — Ambulatory Visit: Payer: Medicare HMO | Admitting: Cardiology

## 2020-04-27 ENCOUNTER — Other Ambulatory Visit: Payer: Self-pay | Admitting: Cardiology

## 2020-04-27 ENCOUNTER — Other Ambulatory Visit (HOSPITAL_COMMUNITY): Payer: Self-pay | Admitting: Internal Medicine

## 2020-04-28 ENCOUNTER — Other Ambulatory Visit: Payer: Self-pay

## 2020-04-28 ENCOUNTER — Encounter: Payer: Self-pay | Admitting: Cardiology

## 2020-04-28 ENCOUNTER — Ambulatory Visit: Payer: Medicare HMO | Admitting: Cardiology

## 2020-04-28 VITALS — BP 138/68 | HR 84 | Ht 72.0 in | Wt 200.0 lb

## 2020-04-28 DIAGNOSIS — I5042 Chronic combined systolic (congestive) and diastolic (congestive) heart failure: Secondary | ICD-10-CM

## 2020-04-28 DIAGNOSIS — I2510 Atherosclerotic heart disease of native coronary artery without angina pectoris: Secondary | ICD-10-CM

## 2020-04-28 DIAGNOSIS — N1832 Chronic kidney disease, stage 3b: Secondary | ICD-10-CM

## 2020-04-28 DIAGNOSIS — J90 Pleural effusion, not elsewhere classified: Secondary | ICD-10-CM

## 2020-04-28 DIAGNOSIS — I4819 Other persistent atrial fibrillation: Secondary | ICD-10-CM | POA: Diagnosis not present

## 2020-04-28 DIAGNOSIS — I350 Nonrheumatic aortic (valve) stenosis: Secondary | ICD-10-CM | POA: Diagnosis not present

## 2020-04-28 DIAGNOSIS — I472 Ventricular tachycardia, unspecified: Secondary | ICD-10-CM

## 2020-04-28 DIAGNOSIS — E785 Hyperlipidemia, unspecified: Secondary | ICD-10-CM | POA: Diagnosis not present

## 2020-04-28 MED ORDER — FUROSEMIDE 40 MG PO TABS: 40.0000 mg | ORAL_TABLET | Freq: Every day | ORAL | 0 refills | Status: DC | PRN

## 2020-04-28 NOTE — Patient Instructions (Addendum)
Medication Instructions:  Your physician recommends that you continue on your current medications as directed. Please refer to the Current Medication list given to you today.  *If you need a refill on your cardiac medications before your next appointment, please call your pharmacy*  Follow-Up: At [facility], you and your health needs are our priority.  As part of our continuing mission to provide you with exceptional heart care, we have created designated Provider Care Teams.  These Care Teams include your primary Cardiologist (physician) and Advanced Practice Providers (APPs -  Physician Assistants and Nurse Practitioners) who all work together to provide you with the care you need, when you need it.  Your next appointment:   6 month(s)  The format for your next appointment:   In Person  Provider:   You may see [name], MD or one of the following Advanced Practice Providers on your designated Care Team:    Melina Copa, PA-C  Ermalinda Barrios, PA-C

## 2020-04-28 NOTE — Progress Notes (Signed)
Date:  04/28/2020   ID:  [name], DOB [birth year], MRN [id number]  PCP:  [name] Melter, MD  Cardiologist:  [name] Him, MD  Electrophysiologist:  [name] Haw, MD   Chief Complaint:  CHF, VT, CAD, HLD  History of Present Illness:    [name] is a 87 y.o. male with a history of hypertension, aortic stenosis status post AVR, chronic combined systolic and diastolic CHF in the setting of A. fib with RVR, orthostatic hypotension, dilated cardiomyopathy EF 25-30%, status post CABG.  He does have paroxysmal atrial fibrillation and has been cardioverted in the past.  He had CRT-D upgrade on 04/22/15.  He is on mexiletine and Sotolol for VT.    He saw Dr. Curt [name] 01/2020 and was doing well.  His Medtronic CRT-D was working well and pacing rate was increased to 75bpm to suppress PVCs.  His diuretics had been increased due to an elevated Optivol and that returned to normal.  He had a DCCV in July 2021 and has maintained NSR since then.    He is here today for followup and is doing well.  He denies any chest pain or pressure, SOB, DOE (except when he gets out in the cold air), PND, orthopnea, LE edema, dizziness (except when he gets up too fast), palpitations or syncope. He is compliant with his meds and is tolerating meds with no SE.  He tells me that some mornings his BP is low and has started to drink more fluid at night which has helped.  Prior CV studies:   The following studies were reviewed today:  none  Past Medical History:  Diagnosis Date  . Arthritis    "minor everywhere" (04/22/2015)  . Asthma    "a touch"  . Barrett esophagus   . CAD (coronary artery disease)    a. s/p 3 vvessel CABG with LIMA to LAD, SVG to diagonal 1, SVG to RCA 12/09.  . Cardiac resynchronization therapy defibrillator (CRT-D) in place   . Chronic anticoagulation 01/23/2013  . Chronic combined systolic and diastolic CHF (congestive heart failure) (HCC)    dry weight 197-200lbs.  . CKD  (chronic kidney disease) stage 3, GFR 30-59 ml/min (HCC) 08/23/2014  . DCM (dilated cardiomyopathy) ([place])    initially concerned for TTP amyloidosis but Tc-PYP scan 06/2017 was not consistent with amyloid and SPEP/UPEP with no M spike. EF 20-25% by echo 2019  . Diverticulitis 08/2019  . Dyslipidemia 01/23/2013  . Essential hypertension 01/23/2013  . GERD (gastroesophageal reflux disease)   . GI bleed 05/08/2017  . H/O: rheumatic fever   . Hepatitis 1957   "don't know what kind:  . History of hiatal hernia   . History of PFTs    a. Amiodarone started 5/16 >> PFTs w/ DLCO 6/16:  FEV1 72% predicted, FEV1/FVC 66%, DLCO 66% >> minimal reversible obstructive airways disease with mild diffusion defect (suggestive of emphysema but absence of hyperinflation inconsistent with dx)  . Hyperkalemia 08/23/2014  . Hyperthyroidism 05/26/2017  . Leg weakness 01/28/2017  . Multiple thyroid nodules 08/13/2017  . Orthostatic hypotension   . Persistent atrial fibrillation ([place]) 01/23/2013  . Pleural effusion on right 05/26/2017  . Pneumonia 08/2014   "dr thought I may have had a touch"  . Pre-diabetes   . Protein-calorie malnutrition, severe 05/27/2017  . S/P AVR (aortic valve replacement) 2009   a. severe AS s/p AVR with pericardial tissue valve 2009.  Marland Kitchen Shingles 08/23/2014  . VT (ventricular tachycardia) ([place]) 05/06/2017  Past Surgical History:  Procedure Laterality Date  . AORTIC VALVE REPLACEMENT  with 23-mm Magna Ease pericardial valve, model number   with 23-mm Magna Ease pericardial valve, model [id number], serial number [id number]   . APPENDECTOMY  1940s  . BI-VENTRICULAR IMPLANTABLE CARDIOVERTER DEFIBRILLATOR  (CRT-D)  04/22/2015  . CARDIAC CATHETERIZATION N/A 11/04/2014   Procedure: Left Heart Cath and Cors/Grafts Angiography;  Surgeon: Leonie Man, MD;  Location: [place] [facility];  Service: Cardiovascular;  Laterality: N/A;  . CARDIAC VALVE REPLACEMENT    . CARDIOVERSION N/A 01/27/2013    Procedure: CARDIOVERSION;  Surgeon: Sueanne Margarita, MD;  Location: [facility];  Service: Cardiovascular;  Laterality: N/A;  . CARDIOVERSION N/A 08/25/2014   Procedure: CARDIOVERSION;  Surgeon: Sanda Klein, MD;  Location: [place] [facility];  Service: Cardiovascular;  Laterality: N/A;  . CARDIOVERSION N/A 05/28/2018   Procedure: CARDIOVERSION;  Surgeon: Pixie Casino, MD;  Location: [facility] ENDOSCOPY;  Service: Cardiovascular;  Laterality: N/A;  . CARDIOVERSION N/A 10/28/2019   Procedure: CARDIOVERSION;  Surgeon: Sueanne Margarita, MD;  Location: [facility] ENDOSCOPY;  Service: Cardiovascular;  Laterality: N/A;  . CATARACT EXTRACTION W/ INTRAOCULAR LENS  IMPLANT, BILATERAL Bilateral   . CHEST TUBE INSERTION Right 07/26/2017   Procedure: INSERTION PLEURAL DRAINAGE CATHETER - RIGHT;  Surgeon: Ivin Poot, MD;  Location: [place];  Service: Thoracic;  Laterality: Right;  . CORONARY ARTERY BYPASS GRAFT  03/10/2008   x 3 Dr. Roxan Hockey  . EP IMPLANTABLE DEVICE N/A 04/22/2015   Procedure: BiV ICD Insertion CRT-D;  Surgeon: [name], MD;  Location: [place] [facility];  Service: Cardiovascular;  Laterality: N/A;  . ESOPHAGOGASTRODUODENOSCOPY (EGD) WITH ESOPHAGEAL DILATION  X1  . ESOPHAGOGASTRODUODENOSCOPY (EGD) WITH PROPOFOL N/A 05/09/2017   Procedure: ESOPHAGOGASTRODUODENOSCOPY (EGD) WITH PROPOFOL;  Surgeon: Carol Ada, MD;  Location: [place];  Service: Endoscopy;  Laterality: N/A;  . IR THORACENTESIS ASP PLEURAL SPACE W/IMG GUIDE  05/27/2017  . IR THORACENTESIS ASP PLEURAL SPACE W/IMG GUIDE  06/17/2017  . IR THORACENTESIS ASP PLEURAL SPACE W/IMG GUIDE  06/18/2017  . IR THORACENTESIS ASP PLEURAL SPACE W/IMG GUIDE  07/11/2017  . REMOVAL OF PLEURAL DRAINAGE CATHETER Right 09/13/2017   Procedure: REMOVAL OF PLEURAL DRAINAGE CATHETER;  Surgeon: Ivin Poot, MD;  Location: [place];  Service: Thoracic;  Laterality: Right;  . RIGHT HEART CATH AND CORONARY/GRAFT ANGIOGRAPHY N/A 05/31/2017   Procedure: RIGHT  HEART CATH AND CORONARY/GRAFT ANGIOGRAPHY;  Surgeon: Jolaine Artist, MD;  Location: [place] [facility];  Service: Cardiovascular;  Laterality: N/A;  . TONSILLECTOMY       Current Meds  Medication Sig  . acetaminophen (TYLENOL) 500 MG tablet Take 1,000 mg by mouth every 6 (six) hours as needed for moderate pain or headache.  Marland Kitchen apixaban (ELIQUIS) 2.5 MG TABS tablet Take 1 tablet (2.5 mg total) by mouth 2 (two) times daily. NEEDS APPT FOR FUTURE REFILLS  . atorvastatin (LIPITOR) 20 MG tablet TAKE 1 TABLET EVERY DAY  AT  6  PM  . carvedilol (COREG) 25 MG tablet TAKE 1 TABLET TWICE DAILY (DOSE INCREASE)  . famotidine (PEPCID) 40 MG tablet Take 40 mg by mouth every evening.   . furosemide (LASIX) 40 MG tablet Take 1 tablet (40 mg total) by mouth daily as needed for edema.  . hydrALAZINE (APRESOLINE) 25 MG tablet Take 12.5 mg by mouth 2 (two) times daily.   . isosorbide mononitrate (IMDUR) 30 MG 24 hr tablet TAKE 1 TABLET EVERY DAY  . mexiletine (MEXITIL) 150  MG capsule Take 2 capsules (300 mg total) by mouth every 12 (twelve) hours.  . Multiple Vitamin (MULTIVITAMIN WITH MINERALS) TABS tablet Take 1 tablet by mouth daily.   . Omega-3 Fatty Acids (FISH OIL) 1000 MG CAPS Take 2,000 mg by mouth 2 (two) times daily.  . pantoprazole (PROTONIX) 40 MG tablet Take 40 mg by mouth daily.  . sotalol (BETAPACE) 80 MG tablet TAKE 1 TABLET TWICE DAILY     Allergies:   Ranolazine, Novocain [procaine], and Amiodarone   Social History   Tobacco Use  . Smoking status: Never Smoker  . Smokeless tobacco: Never Used  Vaping Use  . Vaping Use: Never used  Substance Use Topics  . Alcohol use: Not Currently    Comment: 04/22/2015 "nothing since the 1980s; never had a problem w/it"  . Drug use: No     Family Hx: The patient's family history includes Alzheimer's disease in his mother and another family member; COPD in his daughter and sister; Depression in his sister; Esophageal cancer in his sister; Heart  disease in his sister; Hyperlipidemia in his sister; Hypertension in his sister.  ROS:   Please see the history of present illness.     All other systems reviewed and are negative.   Labs/Other Tests and Data Reviewed:    2D echo 08/2019 IMPRESSIONS   1. Left ventricular ejection fraction, by estimation, is 30 to 35%. The  left ventricle has moderately decreased function. Left ventricular  endocardial border not optimally defined to evaluate regional wall motion.  Left ventricular diastolic parameters  are consistent with Grade II diastolic dysfunction (pseudonormalization).  Elevated left ventricular end-diastolic pressure.  2. Right ventricular systolic function is moderately reduced. The right  ventricular size is normal. Tricuspid regurgitation signal is inadequate  for assessing PA pressure.  3. Left atrial size was mildly dilated.  4. The mitral valve is abnormal. Trivial mitral valve regurgitation. No  evidence of mitral stenosis.  5. The aortic valve has been repaired/replaced. Aortic valve  regurgitation is not visualized. There is a 23 mm Magna Ease pericardial  valve present in the aortic position. Procedure Date: 03/10/2008. Echo  findings are consistent with normal structure and  function of the aortic valve prosthesis. Aortic valve mean gradient  measures 6.0 mmHg.   Recent Labs: 09/08/2019: ALT 25 10/21/2019: Hemoglobin 12.5; Magnesium 2.3; Platelets 307 11/18/2019: BUN 17; Creatinine, Ser 1.75; Potassium 4.8; Sodium 137   Recent Lipid Panel Lab Results  Component Value Date/Time   CHOL 126 07/30/2016 11:43 AM   CHOL 93 (L) 06/16/2014 09:14 AM   TRIG 154 (H) 07/30/2016 11:43 AM   TRIG 124 06/16/2014 09:14 AM   HDL 45 07/30/2016 11:43 AM   HDL 34 (L) 06/16/2014 09:14 AM   CHOLHDL 2.8 07/30/2016 11:43 AM   CHOLHDL 2.7 01/19/2015 10:05 AM   LDLCALC 50 07/30/2016 11:43 AM   LDLCALC 34 06/16/2014 09:14 AM    Wt Readings from Last 3 Encounters:  04/28/20  200 lb (90.7 kg)  01/12/20 192 lb 6.4 oz (87.3 kg)  12/28/19 188 lb (85.3 kg)     Objective:    Vital Signs:  BP 138/68   Pulse 84   Ht 6' (1.829 m)   Wt 200 lb (90.7 kg)   SpO2 98%   BMI 27.12 kg/m   GEN: Well nourished, well developed in no acute distress HEENT: Normal NECK: No JVD; No carotid bruits LYMPHATICS: No lymphadenopathy CARDIAC:RRR, no murmurs, rubs, gallops RESPIRATORY:  Clear to auscultation  without rales, wheezing or rhonchi  ABDOMEN: Soft, non-tender, non-distended MUSCULOSKELETAL:  No edema; No deformity  SKIN: Warm and dry NEUROLOGIC:  Alert and oriented x 3 PSYCHIATRIC:  Normal affect    ASSESSMENT & PLAN:    1. Recurrent R Pleural effusion - Had Pleurex placed 4/19 after multiple thoracentesis - Effusions resolved and Pluerex removed 6/19 - No evidence of recurrence   2. Chronic systolic HF due to ICM s/p CRT-D - Echo 05/27/2017 LVEF 20-25% - Remains NYHA class 2b - he is doing well and has not had any LE edema or DOE - weight stable at 200lbs today  (199lb in Aug in [place] clinic and weight the same today). - Continue lasix 46m daily and extra PRN for weight gain, Carvedilol 2428mBID, Hydralazine 12.28m31mID, Imdur 46m428mily - Not on ACE/ARB/ARNI/MRA due to CKD   3. CAD s/p CABG/AVR 2009 - Cath 05/2017 showed three-vessel CAD with patent LIMA to the LAD, SVG to diagonal and SVG to the RCA. - he has not had any anginal symptoms - continue statin, BB and long acting nitrates - No ASA due to DOAC  4. VT - S/p ICD with CRT-D upgrade in 2017 - followed in device clinic and with Dr. CamnCurt [name] found to have recurrent VT on recent device check and now on Mexiletine 150mg64m and Sotolol 80mg 97mper EP  5. Paroxysmal AF - s/p DCCV in 2/20 - he is maintaining NSR on exam and has not had any palpitations - Continue Eliquis 2.5 mg BID (dosed for age>80 and creatinine>1.5) and Carvedilol 228mg B71m denies any bleeding on DOAC - Hbg was 13.4 in Oct  2021  6.  Severe AS -s/p 23mm Ma39mEase pericardial tissue valve in 2009 -echo 05/2017 showed stable AVR with mean AVG 10mmHg a60mVA 1.52cm2.  7. CKD 3-4 - followed with [place].  HLD - LDL goal is < 70 - LDL was 50 a year ago - repeat FLP and ALT by PCP next week - continue atorvastatin 20mg dail57mests Ordered: No orders of the defined types were placed in this encounter.  Medication Changes: No orders of the defined types were placed in this encounter.   Disposition:  Follow up in 1 year(s)  Signed, Elyn Krogh TurnFransico [name]/2022 2:49 PM    [facility]

## 2020-05-06 DIAGNOSIS — I471 Supraventricular tachycardia: Secondary | ICD-10-CM | POA: Diagnosis not present

## 2020-05-06 DIAGNOSIS — I472 Ventricular tachycardia: Secondary | ICD-10-CM | POA: Diagnosis not present

## 2020-05-06 DIAGNOSIS — Z954 Presence of other heart-valve replacement: Secondary | ICD-10-CM | POA: Diagnosis not present

## 2020-05-06 DIAGNOSIS — I2510 Atherosclerotic heart disease of native coronary artery without angina pectoris: Secondary | ICD-10-CM | POA: Diagnosis not present

## 2020-05-06 DIAGNOSIS — I130 Hypertensive heart and chronic kidney disease with heart failure and stage 1 through stage 4 chronic kidney disease, or unspecified chronic kidney disease: Secondary | ICD-10-CM | POA: Diagnosis not present

## 2020-05-06 DIAGNOSIS — E1122 Type 2 diabetes mellitus with diabetic chronic kidney disease: Secondary | ICD-10-CM | POA: Diagnosis not present

## 2020-05-06 DIAGNOSIS — K2270 Barrett's esophagus without dysplasia: Secondary | ICD-10-CM | POA: Diagnosis not present

## 2020-05-06 DIAGNOSIS — I4891 Unspecified atrial fibrillation: Secondary | ICD-10-CM | POA: Diagnosis not present

## 2020-05-06 DIAGNOSIS — Z0000 Encounter for general adult medical examination without abnormal findings: Secondary | ICD-10-CM | POA: Diagnosis not present

## 2020-05-06 DIAGNOSIS — I10 Essential (primary) hypertension: Secondary | ICD-10-CM | POA: Diagnosis not present

## 2020-05-06 DIAGNOSIS — I509 Heart failure, unspecified: Secondary | ICD-10-CM | POA: Diagnosis not present

## 2020-05-06 DIAGNOSIS — N184 Chronic kidney disease, stage 4 (severe): Secondary | ICD-10-CM | POA: Diagnosis not present

## 2020-05-06 DIAGNOSIS — Z7901 Long term (current) use of anticoagulants: Secondary | ICD-10-CM | POA: Diagnosis not present

## 2020-05-06 DIAGNOSIS — E7800 Pure hypercholesterolemia, unspecified: Secondary | ICD-10-CM | POA: Diagnosis not present

## 2020-05-17 ENCOUNTER — Ambulatory Visit (INDEPENDENT_AMBULATORY_CARE_PROVIDER_SITE_OTHER): Payer: Medicare HMO

## 2020-05-17 DIAGNOSIS — I255 Ischemic cardiomyopathy: Secondary | ICD-10-CM | POA: Diagnosis not present

## 2020-05-17 LAB — CUP PACEART REMOTE DEVICE CHECK
Battery Remaining Longevity: 25 mo
Battery Voltage: 2.93 V
Brady Statistic AP VP Percent: 98.08 %
Brady Statistic AP VS Percent: 1.29 %
Brady Statistic AS VP Percent: 0.61 %
Brady Statistic AS VS Percent: 0.01 %
Brady Statistic RA Percent Paced: 98.37 %
Brady Statistic RV Percent Paced: 98 %
Date Time Interrogation Session: 20220208043824
HighPow Impedance: 58 Ohm
Implantable Lead Implant Date: 20170113
Implantable Lead Implant Date: 20170113
Implantable Lead Implant Date: 20170113
Implantable Lead Location: 753858
Implantable Lead Location: 753859
Implantable Lead Location: 753860
Implantable Lead Model: 4298
Implantable Lead Model: 5076
Implantable Pulse Generator Implant Date: 20170113
Lead Channel Impedance Value: 380 Ohm
Lead Channel Impedance Value: 399 Ohm
Lead Channel Impedance Value: 437 Ohm
Lead Channel Impedance Value: 437 Ohm
Lead Channel Impedance Value: 437 Ohm
Lead Channel Impedance Value: 456 Ohm
Lead Channel Impedance Value: 532 Ohm
Lead Channel Impedance Value: 589 Ohm
Lead Channel Impedance Value: 779 Ohm
Lead Channel Impedance Value: 779 Ohm
Lead Channel Impedance Value: 912 Ohm
Lead Channel Impedance Value: 912 Ohm
Lead Channel Impedance Value: 950 Ohm
Lead Channel Pacing Threshold Amplitude: 0.375 V
Lead Channel Pacing Threshold Amplitude: 0.75 V
Lead Channel Pacing Threshold Amplitude: 0.75 V
Lead Channel Pacing Threshold Pulse Width: 0.4 ms
Lead Channel Pacing Threshold Pulse Width: 0.4 ms
Lead Channel Pacing Threshold Pulse Width: 0.4 ms
Lead Channel Sensing Intrinsic Amplitude: 0.375 mV
Lead Channel Sensing Intrinsic Amplitude: 0.375 mV
Lead Channel Sensing Intrinsic Amplitude: 9 mV
Lead Channel Sensing Intrinsic Amplitude: 9 mV
Lead Channel Setting Pacing Amplitude: 1.5 V
Lead Channel Setting Pacing Amplitude: 1.75 V
Lead Channel Setting Pacing Amplitude: 2 V
Lead Channel Setting Pacing Pulse Width: 0.4 ms
Lead Channel Setting Pacing Pulse Width: 0.4 ms
Lead Channel Setting Sensing Sensitivity: 0.3 mV

## 2020-05-23 NOTE — Progress Notes (Signed)
Remote ICD transmission.

## 2020-05-26 ENCOUNTER — Telehealth: Payer: Self-pay

## 2020-05-26 DIAGNOSIS — E785 Hyperlipidemia, unspecified: Secondary | ICD-10-CM

## 2020-05-26 NOTE — Telephone Encounter (Signed)
Lab work received from patient's PCP done on 05/06/20 that showed triglycerides at 219. Per Dr. Daiva Nakayama are too high and need to find out if patient was fasting for the lab work.  Spoke with the patient who states that he was fasting when the lab work was done.

## 2020-05-27 NOTE — Telephone Encounter (Signed)
Refer to lipid clinic

## 2020-05-27 NOTE — Telephone Encounter (Signed)
Referral placed for lipid clinic

## 2020-05-30 NOTE — Progress Notes (Signed)
Patient ID: [name]                 DOB: 1932-10-11                    MRN: [id number]     HPI: [name] is a 87 y.o. male patient referred to lipid clinic by Dr. Radford Pax for triglyceride management. PMH is significant for HTN, aortic stenosis status post AVR, chronic combined systolic and diastolic CHF in the setting of afib with RVR, dilated cardiomyopathy EF 25-30%, CABG in 2009, DCCV in July 2021, and orthostatic hypotension,.  Patient presents today in good spirits for telemedicine visit. Reports medication adherence with atorvastatin 20 mg daily. Denies myalgias. Also reports taking omega-3 fatty acids (fish oil) 2 g BID which he receives for free. Reports not following the best diet and has a sweet tooth (see below). Denies alcohol consumption. Reports desire to lose weight and exercises for ~1 hour daily using stationary mini exercise bike.  Current Medications: atorvastatin 20 mg daily, fish oil 2 g BID Intolerances: none  Risk Factors: HTN, CHF, s/p CABG LDL goal: <70 mg/dL  Diet:  Breakfast - cornflakes, 2% milk, tangerines  Lunch/Dinner: homemade soup - vegetable and pork; chicken dumplings; takeout; cupcake after lunch; rice, very little pasta  Snacks: sardines, celery sticks, candy occasionally  Drink: water   Exercise: ~1 hour daily using stationary mini exercise bike; gardens  Family History: Alzheimer's disease in his mother and another family member; COPD in his daughter and sister; Depression in his sister; Esophageal cancer in his sister; Heart disease in his sister; Hyperlipidemia in his sister; Hypertension in his sister.  Social History: denies tobacco use; denies alcohol use  Labs: 05/06/20: LDL 51, TG 219, HDL 39, TC 125, nonHDL 86 (atorvastatin 20 mg daily) 07/30/16: LDL 50, TG 154, TC 126, HDL 45 (atorvastatin 20 mg daily)  Past Medical History:  Diagnosis Date  . Arthritis    "minor everywhere" (04/22/2015)  . Asthma    "a touch"  . Barrett  esophagus   . CAD (coronary artery disease)    a. s/p 3 vvessel CABG with LIMA to LAD, SVG to diagonal 1, SVG to RCA 12/09.  . Cardiac resynchronization therapy defibrillator (CRT-D) in place   . Chronic anticoagulation 01/23/2013  . Chronic combined systolic and diastolic CHF (congestive heart failure) (HCC)    dry weight 197-200lbs.  . CKD (chronic kidney disease) stage 3, GFR 30-59 ml/min (HCC) 08/23/2014  . DCM (dilated cardiomyopathy) ([place])    initially concerned for TTP amyloidosis but Tc-PYP scan 06/2017 was not consistent with amyloid and SPEP/UPEP with no M spike. EF 20-25% by echo 2019  . Diverticulitis 08/2019  . Dyslipidemia 01/23/2013  . Essential hypertension 01/23/2013  . GERD (gastroesophageal reflux disease)   . GI bleed 05/08/2017  . H/O: rheumatic fever   . Hepatitis 1957   "don't know what kind:  . History of hiatal hernia   . History of PFTs    a. Amiodarone started 5/16 >> PFTs w/ DLCO 6/16:  FEV1 72% predicted, FEV1/FVC 66%, DLCO 66% >> minimal reversible obstructive airways disease with mild diffusion defect (suggestive of emphysema but absence of hyperinflation inconsistent with dx)  . Hyperkalemia 08/23/2014  . Hyperthyroidism 05/26/2017  . Leg weakness 01/28/2017  . Multiple thyroid nodules 08/13/2017  . Orthostatic hypotension   . Persistent atrial fibrillation ([place]) 01/23/2013  . Pleural effusion on right 05/26/2017  . Pneumonia 08/2014   "dr  thought I may have had a touch"  . Pre-diabetes   . Protein-calorie malnutrition, severe 05/27/2017  . S/P AVR (aortic valve replacement) 2009   a. severe AS s/p AVR with pericardial tissue valve 2009.  Marland Kitchen Shingles 08/23/2014  . VT (ventricular tachycardia) ([place]) 05/06/2017    Current Outpatient Medications on File Prior to Visit  Medication Sig Dispense Refill  . acetaminophen (TYLENOL) 500 MG tablet Take 1,000 mg by mouth every 6 (six) hours as needed for moderate pain or headache.    Marland Kitchen apixaban (ELIQUIS) 2.5 MG TABS  tablet Take 1 tablet (2.5 mg total) by mouth 2 (two) times daily. NEEDS APPT FOR FUTURE REFILLS 180 tablet 1  . atorvastatin (LIPITOR) 20 MG tablet TAKE 1 TABLET EVERY DAY  AT  6  PM 90 tablet 2  . carvedilol (COREG) 25 MG tablet TAKE 1 TABLET TWICE DAILY (DOSE INCREASE) 180 tablet 3  . famotidine (PEPCID) 40 MG tablet Take 40 mg by mouth every evening.     . furosemide (LASIX) 40 MG tablet Take 1 tablet (40 mg total) by mouth daily as needed for edema. 90 tablet 0  . hydrALAZINE (APRESOLINE) 25 MG tablet Take 12.5 mg by mouth 2 (two) times daily.     . isosorbide mononitrate (IMDUR) 30 MG 24 hr tablet TAKE 1 TABLET EVERY DAY 90 tablet 2  . mexiletine (MEXITIL) 150 MG capsule Take 2 capsules (300 mg total) by mouth every 12 (twelve) hours.    . Multiple Vitamin (MULTIVITAMIN WITH MINERALS) TABS tablet Take 1 tablet by mouth daily.     . Omega-3 Fatty Acids (FISH OIL) 1000 MG CAPS Take 2,000 mg by mouth 2 (two) times daily.    . pantoprazole (PROTONIX) 40 MG tablet Take 40 mg by mouth daily.    . sotalol (BETAPACE) 80 MG tablet TAKE 1 TABLET TWICE DAILY 180 tablet 2   No current facility-administered medications on file prior to visit.    Allergies  Allergen Reactions  . Ranolazine Other (See Comments)    States he went into a coma and was in hospital for 3 days after taking  . Novocain [Procaine] Swelling  . Amiodarone Nausea And Vomiting    Assessment/Plan:  1. Hyperlipidemia - LDL at goal <70 mg/dL, however TG above goal <150 mg/dL. Medication adherence appears optimal with atorvastatin and denies myalgias. Discussed increasing atorvastatin to 40 mg daily and lifestyle modifications (diet and exercise). Patient requested to focus on minimizing sugar and carb intake, and losing weight. Encouraged patient to aim for a diet full of vegetables, fruit and lean meats (chicken, [place], fish) and to limit sugar, carbs (bread, pasta, rice) and red meat consumption. Encouraged patient to continue  to exercise 20-30 minutes daily with the goal of 150 minutes per week. Patient verbalized understanding. Continue atorvastatin 20 mg daily and fish oil 2g BID (pt receiving for free; Vascepa is $45/month). Discussed checking lipid panel at next visit with PCP at [facility] office. Patient verbalized understanding.   Lorel Monaco, PharmD, [facility] [address], [place], [place] [address] Phone: [phone]; Fax: [phone]

## 2020-05-31 ENCOUNTER — Telehealth (INDEPENDENT_AMBULATORY_CARE_PROVIDER_SITE_OTHER): Payer: Medicare HMO | Admitting: Pharmacist

## 2020-05-31 ENCOUNTER — Other Ambulatory Visit: Payer: Self-pay

## 2020-05-31 DIAGNOSIS — E785 Hyperlipidemia, unspecified: Secondary | ICD-10-CM

## 2020-06-20 ENCOUNTER — Other Ambulatory Visit: Payer: Self-pay | Admitting: Cardiology

## 2020-07-10 ENCOUNTER — Other Ambulatory Visit: Payer: Self-pay | Admitting: Cardiology

## 2020-07-10 DIAGNOSIS — I5042 Chronic combined systolic (congestive) and diastolic (congestive) heart failure: Secondary | ICD-10-CM

## 2020-08-10 DIAGNOSIS — K2270 Barrett's esophagus without dysplasia: Secondary | ICD-10-CM | POA: Diagnosis not present

## 2020-08-10 DIAGNOSIS — E1122 Type 2 diabetes mellitus with diabetic chronic kidney disease: Secondary | ICD-10-CM | POA: Diagnosis not present

## 2020-08-10 DIAGNOSIS — I129 Hypertensive chronic kidney disease with stage 1 through stage 4 chronic kidney disease, or unspecified chronic kidney disease: Secondary | ICD-10-CM | POA: Diagnosis not present

## 2020-08-10 DIAGNOSIS — E875 Hyperkalemia: Secondary | ICD-10-CM | POA: Diagnosis not present

## 2020-08-10 DIAGNOSIS — N184 Chronic kidney disease, stage 4 (severe): Secondary | ICD-10-CM | POA: Diagnosis not present

## 2020-08-10 DIAGNOSIS — I5022 Chronic systolic (congestive) heart failure: Secondary | ICD-10-CM | POA: Diagnosis not present

## 2020-08-16 ENCOUNTER — Ambulatory Visit (INDEPENDENT_AMBULATORY_CARE_PROVIDER_SITE_OTHER): Payer: Medicare HMO

## 2020-08-16 DIAGNOSIS — I255 Ischemic cardiomyopathy: Secondary | ICD-10-CM | POA: Diagnosis not present

## 2020-08-16 LAB — CUP PACEART REMOTE DEVICE CHECK
Battery Remaining Longevity: 22 mo
Battery Voltage: 2.91 V
Brady Statistic AP VP Percent: 90.1 %
Brady Statistic AP VS Percent: 1.18 %
Brady Statistic AS VP Percent: 7.81 %
Brady Statistic AS VS Percent: 0.91 %
Brady Statistic RA Percent Paced: 89.56 %
Brady Statistic RV Percent Paced: 97.52 %
Date Time Interrogation Session: 20220510022725
HighPow Impedance: 61 Ohm
Implantable Lead Implant Date: 20170113
Implantable Lead Implant Date: 20170113
Implantable Lead Implant Date: 20170113
Implantable Lead Location: 753858
Implantable Lead Location: 753859
Implantable Lead Location: 753860
Implantable Lead Model: 4298
Implantable Lead Model: 5076
Implantable Pulse Generator Implant Date: 20170113
Lead Channel Impedance Value: 1026 Ohm
Lead Channel Impedance Value: 399 Ohm
Lead Channel Impedance Value: 399 Ohm
Lead Channel Impedance Value: 437 Ohm
Lead Channel Impedance Value: 437 Ohm
Lead Channel Impedance Value: 437 Ohm
Lead Channel Impedance Value: 456 Ohm
Lead Channel Impedance Value: 532 Ohm
Lead Channel Impedance Value: 627 Ohm
Lead Channel Impedance Value: 779 Ohm
Lead Channel Impedance Value: 779 Ohm
Lead Channel Impedance Value: 969 Ohm
Lead Channel Impedance Value: 988 Ohm
Lead Channel Pacing Threshold Amplitude: 0.375 V
Lead Channel Pacing Threshold Amplitude: 0.75 V
Lead Channel Pacing Threshold Amplitude: 0.75 V
Lead Channel Pacing Threshold Pulse Width: 0.4 ms
Lead Channel Pacing Threshold Pulse Width: 0.4 ms
Lead Channel Pacing Threshold Pulse Width: 0.4 ms
Lead Channel Sensing Intrinsic Amplitude: 0.875 mV
Lead Channel Sensing Intrinsic Amplitude: 0.875 mV
Lead Channel Sensing Intrinsic Amplitude: 15.25 mV
Lead Channel Sensing Intrinsic Amplitude: 15.25 mV
Lead Channel Setting Pacing Amplitude: 1.5 V
Lead Channel Setting Pacing Amplitude: 1.75 V
Lead Channel Setting Pacing Amplitude: 2 V
Lead Channel Setting Pacing Pulse Width: 0.4 ms
Lead Channel Setting Pacing Pulse Width: 0.4 ms
Lead Channel Setting Sensing Sensitivity: 0.3 mV

## 2020-08-18 ENCOUNTER — Encounter: Payer: Self-pay | Admitting: Cardiology

## 2020-08-18 ENCOUNTER — Other Ambulatory Visit: Payer: Self-pay

## 2020-08-18 ENCOUNTER — Ambulatory Visit: Payer: Medicare HMO | Admitting: Cardiology

## 2020-08-18 VITALS — BP 126/72 | HR 81 | Ht 72.0 in | Wt 201.0 lb

## 2020-08-18 DIAGNOSIS — I4819 Other persistent atrial fibrillation: Secondary | ICD-10-CM | POA: Diagnosis not present

## 2020-08-18 DIAGNOSIS — I255 Ischemic cardiomyopathy: Secondary | ICD-10-CM | POA: Diagnosis not present

## 2020-08-18 NOTE — Patient Instructions (Signed)
Medication Instructions:  Your physician recommends that you continue on your current medications as directed. Please refer to the Current Medication list given to you today.  *If you need a refill on your cardiac medications before your next appointment, please call your pharmacy*   Lab Work: None ordered If you have labs (blood work) drawn today and your tests are completely normal, you will receive your results only by: Marland Kitchen MyChart Message (if you have MyChart) OR . A paper copy in the mail If you have any lab test that is abnormal or we need to change your treatment, we will call you to review the results.   Testing/Procedures: Your physician has recommended that you have a Cardioversion (DCCV). Electrical Cardioversion uses a jolt of electricity to your heart either through paddles or wired patches attached to your chest. This is a controlled, usually prescheduled, procedure. Defibrillation is done under light anesthesia in the hospital, and you usually go home the day of the procedure. This is done to get your heart back into a normal rhythm. You are not awake for the procedure. Please see the instruction sheet below under "other instructions".  The nurse will call you to arrange this procedure   Follow-Up: At [facility], you and your health needs are our priority.  As part of our continuing mission to provide you with exceptional heart care, we have created designated Provider Care Teams.  These Care Teams include your primary Cardiologist (physician) and Advanced Practice Providers (APPs -  Physician Assistants and Nurse Practitioners) who all work together to provide you with the care you need, when you need it.  Remote monitoring is used to monitor your Pacemaker or ICD from home. This monitoring reduces the number of office visits required to check your device to one time per year. It allows [place] to keep an eye on the functioning of your device to ensure it is working properly. You  are scheduled for a device check from home on 11/15/2020. You may send your transmission at any time that day. If you have a wireless device, the transmission will be sent automatically. After your physician reviews your transmission, you will receive a postcard with your next transmission date.  Your next appointment:   3 month(s)  The format for your next appointment:   In Person  Provider:   Allegra Lai, MD   Thank you for choosing [facility]!!   Trinidad Curet, RN [phone]    Other Instructions   COVID TEST-- On ______________ @ ____________ - You will go to [facility] for your Covid testing.   This is a drive thru test site, stay in your car and the nurse team will come to your car to test you.  After you are tested please go home and self quarantine until the day of your procedure.     You are scheduled for a Cardioversion on ______________ with Dr. _____________.  Please arrive at the [facility] (Main Entrance A) at [facility]: [address] [place], [place] [address] at ______________ am/pm.  DIET: Nothing to eat or drink after midnight except a sip of water with medications (see medication instructions below)  Medication Instructions: Hold ________________  Continue your anticoagulant: Eliquis You will need to continue your anticoagulant after your procedure until you  are told by your provider that it is safe to stop   Labs: ______________  Dennis Bast must have a responsible person to drive you home and stay in  the waiting area during your procedure. Failure to do so could result in cancellation.  Bring your insurance cards.  *Special Note: Every effort is made to have your procedure done on time. Occasionally there are emergencies that occur at the hospital that may cause delays. Please be patient if a delay does occur.

## 2020-08-18 NOTE — Progress Notes (Signed)
Electrophysiology Office Note   Date:  08/18/2020   ID:  HICKS FEICK, DOB 1933-02-05, MRN [id number]  PCP:  [name], MD  Cardiologist:  [name] Primary Electrophysiologist: Joshva Labreck Meredith Leeds, MD    No chief complaint on file.    History of Present Illness: [name] is a 87 y.o. male who presents today for electrophysiology evaluation.     He has a history of hypertension, aortic stenosis status post AVR, chronic combined systolic and diastolic heart failure due to ischemic cardiomyopathy, persistent atrial fibrillation/flutter, coronary artery disease status post CABG.  He is status post Medtronic CRT-D with an upgrade 04/22/2015.  He is currently on sotalol and mexiletine for ventricular tachycardia.  Today, denies symptoms of palpitations, chest pain, shortness of breath, orthopnea, PND, lower extremity edema, claudication, dizziness, presyncope, syncope, bleeding, or neurologic sequela. The patient is tolerating medications without difficulties.  Unfortunately, he has gone into atrial fibrillation.  This occurred a few weeks ago based on device interrogation.  He states that he had a significant change in his level of fatigue and shortness of breath.  His OptiVol shows that he has not been retaining fluid, but despite this, he is gotten significantly more short of breath and tired.  He does not remember any illness that could have flipped him into atrial fibrillation.  Past Medical History:  Diagnosis Date  . Arthritis    "minor everywhere" (04/22/2015)  . Asthma    "a touch"  . Barrett esophagus   . CAD (coronary artery disease)    a. s/p 3 vvessel CABG with LIMA to LAD, SVG to diagonal 1, SVG to RCA 12/09.  . Cardiac resynchronization therapy defibrillator (CRT-D) in place   . Chronic anticoagulation 01/23/2013  . Chronic combined systolic and diastolic CHF (congestive heart failure) (HCC)    dry weight 197-200lbs.  . CKD (chronic kidney disease) stage  3, GFR 30-59 ml/min (HCC) 08/23/2014  . DCM (dilated cardiomyopathy) ([place])    initially concerned for TTP amyloidosis but Tc-PYP scan 06/2017 was not consistent with amyloid and SPEP/UPEP with no M spike. EF 20-25% by echo 2019  . Diverticulitis 08/2019  . Dyslipidemia 01/23/2013  . Essential hypertension 01/23/2013  . GERD (gastroesophageal reflux disease)   . GI bleed 05/08/2017  . H/O: rheumatic fever   . Hepatitis 1957   "don't know what kind:  . History of hiatal hernia   . History of PFTs    a. Amiodarone started 5/16 >> PFTs w/ DLCO 6/16:  FEV1 72% predicted, FEV1/FVC 66%, DLCO 66% >> minimal reversible obstructive airways disease with mild diffusion defect (suggestive of emphysema but absence of hyperinflation inconsistent with dx)  . Hyperkalemia 08/23/2014  . Hyperthyroidism 05/26/2017  . Leg weakness 01/28/2017  . Multiple thyroid nodules 08/13/2017  . Orthostatic hypotension   . Persistent atrial fibrillation ([place]) 01/23/2013  . Pleural effusion on right 05/26/2017  . Pneumonia 08/2014   "dr thought I may have had a touch"  . Pre-diabetes   . Protein-calorie malnutrition, severe 05/27/2017  . S/P AVR (aortic valve replacement) 2009   a. severe AS s/p AVR with pericardial tissue valve 2009.  Marland Kitchen Shingles 08/23/2014  . VT (ventricular tachycardia) ([place]) 05/06/2017   Past Surgical History:  Procedure Laterality Date  . AORTIC VALVE REPLACEMENT  with 23-mm Magna Ease pericardial valve, model number   with 23-mm Magna Ease pericardial valve, model [id number], serial number [id number]   . APPENDECTOMY  1940s  . BI-VENTRICULAR IMPLANTABLE CARDIOVERTER DEFIBRILLATOR  (  CRT-D)  04/22/2015  . CARDIAC CATHETERIZATION N/A 11/04/2014   Procedure: Left Heart Cath and Cors/Grafts Angiography;  Surgeon: Leonie Man, MD;  Location: [facility];  Service: Cardiovascular;  Laterality: N/A;  . CARDIAC VALVE REPLACEMENT    . CARDIOVERSION N/A 01/27/2013   Procedure: CARDIOVERSION;  Surgeon:  Sueanne Margarita, MD;  Location: [facility];  Service: Cardiovascular;  Laterality: N/A;  . CARDIOVERSION N/A 08/25/2014   Procedure: CARDIOVERSION;  Surgeon: Sanda Klein, MD;  Location: [facility];  Service: Cardiovascular;  Laterality: N/A;  . CARDIOVERSION N/A 05/28/2018   Procedure: CARDIOVERSION;  Surgeon: Pixie Casino, MD;  Location: [facility] ENDOSCOPY;  Service: Cardiovascular;  Laterality: N/A;  . CARDIOVERSION N/A 10/28/2019   Procedure: CARDIOVERSION;  Surgeon: Sueanne Margarita, MD;  Location: [facility];  Service: Cardiovascular;  Laterality: N/A;  . CATARACT EXTRACTION W/ INTRAOCULAR LENS  IMPLANT, BILATERAL Bilateral   . CHEST TUBE INSERTION Right 07/26/2017   Procedure: INSERTION PLEURAL DRAINAGE CATHETER - RIGHT;  Surgeon: Ivin Poot, MD;  Location: [facility];  Service: Thoracic;  Laterality: Right;  . CORONARY ARTERY BYPASS GRAFT  03/10/2008   x 3 Dr. Roxan Hockey  . EP IMPLANTABLE DEVICE N/A 04/22/2015   Procedure: BiV ICD Insertion CRT-D;  Surgeon: Sheldon Amara Meredith Leeds, MD;  Location: [facility];  Service: Cardiovascular;  Laterality: N/A;  . ESOPHAGOGASTRODUODENOSCOPY (EGD) WITH ESOPHAGEAL DILATION  X1  . ESOPHAGOGASTRODUODENOSCOPY (EGD) WITH PROPOFOL N/A 05/09/2017   Procedure: ESOPHAGOGASTRODUODENOSCOPY (EGD) WITH PROPOFOL;  Surgeon: Carol Ada, MD;  Location: [facility];  Service: Endoscopy;  Laterality: N/A;  . IR THORACENTESIS ASP PLEURAL SPACE W/IMG GUIDE  05/27/2017  . IR THORACENTESIS ASP PLEURAL SPACE W/IMG GUIDE  06/17/2017  . IR THORACENTESIS ASP PLEURAL SPACE W/IMG GUIDE  06/18/2017  . IR THORACENTESIS ASP PLEURAL SPACE W/IMG GUIDE  07/11/2017  . REMOVAL OF PLEURAL DRAINAGE CATHETER Right 09/13/2017   Procedure: REMOVAL OF PLEURAL DRAINAGE CATHETER;  Surgeon: Ivin Poot, MD;  Location: [facility];  Service: Thoracic;  Laterality: Right;  . RIGHT HEART CATH AND CORONARY/GRAFT ANGIOGRAPHY N/A 05/31/2017   Procedure: RIGHT HEART CATH AND CORONARY/GRAFT  ANGIOGRAPHY;  Surgeon: Jolaine Artist, MD;  Location: [facility];  Service: Cardiovascular;  Laterality: N/A;  . TONSILLECTOMY       Current Outpatient Medications  Medication Sig Dispense Refill  . acetaminophen (TYLENOL) 500 MG tablet Take 1,000 mg by mouth every 6 (six) hours as needed for moderate pain or headache.    Marland Kitchen apixaban (ELIQUIS) 2.5 MG TABS tablet Take 1 tablet (2.5 mg total) by mouth 2 (two) times daily. NEEDS APPT FOR FUTURE REFILLS 180 tablet 1  . atorvastatin (LIPITOR) 20 MG tablet TAKE 1 TABLET EVERY DAY  AT  6  PM 90 tablet 2  . carvedilol (COREG) 25 MG tablet TAKE 1 TABLET TWICE DAILY (DOSE INCREASE) 180 tablet 3  . famotidine (PEPCID) 40 MG tablet Take 40 mg by mouth every evening.     . furosemide (LASIX) 40 MG tablet Take 1 tablet (40 mg total) by mouth daily as needed for edema. 90 tablet 0  . hydrALAZINE (APRESOLINE) 25 MG tablet TAKE 1/2 TABLET TWICE DAILY BEFORE MEALS 90 tablet 0  . isosorbide mononitrate (IMDUR) 30 MG 24 hr tablet TAKE 1 TABLET EVERY DAY 90 tablet 2  . mexiletine (MEXITIL) 150 MG capsule Take 2 capsules (300 mg total) by mouth every 12 (twelve) hours. 360 capsule 1  . Multiple Vitamin (MULTIVITAMIN WITH MINERALS) TABS  tablet Take 1 tablet by mouth daily.     . Omega-3 Fatty Acids (FISH OIL) 1000 MG CAPS Take 2,000 mg by mouth 2 (two) times daily.    . pantoprazole (PROTONIX) 40 MG tablet Take 40 mg by mouth daily.    . sotalol (BETAPACE) 80 MG tablet TAKE 1 TABLET TWICE DAILY 180 tablet 2   No current facility-administered medications for this visit.    Allergies:   Ranolazine, Novocain [procaine], and Amiodarone   Social History:  The patient  reports that he has never smoked. He has never used smokeless tobacco. He reports previous alcohol use. He reports that he does not use drugs.   Family History:  The patient's family history includes Alzheimer's disease in his mother and another family member; COPD in his daughter and  sister; Depression in his sister; Esophageal cancer in his sister; Heart disease in his sister; Hyperlipidemia in his sister; Hypertension in his sister.   ROS:  Please see the history of present illness.   Otherwise, review of systems is positive for none.   All other systems are reviewed and negative.   PHYSICAL EXAM: VS:  BP 126/72   Pulse 81   Ht 6' (1.829 m)   Wt 201 lb (91.2 kg)   BMI 27.26 kg/m  , BMI Body mass index is 27.26 kg/m. GEN: Well nourished, well developed, in no acute distress  HEENT: normal  Neck: no JVD, carotid bruits, or masses Cardiac: RRR; no murmurs, rubs, or gallops,no edema  Respiratory:  clear to auscultation bilaterally, normal work of breathing GI: soft, nontender, nondistended, + BS MS: no deformity or atrophy  Skin: warm and dry, device site well healed Neuro:  Strength and sensation are intact Psych: euthymic mood, full affect  EKG:  EKG is ordered today. Personal review of the ekg ordered shows atrial fibrillation, ventricular paced, rate 81 PVCs  Personal review of the device interrogation today. Results in [place]: 09/08/2019: ALT 25 10/21/2019: Hemoglobin 12.5; Magnesium 2.3; Platelets 307 11/18/2019: BUN 17; Creatinine, Ser 1.75; Potassium 4.8; Sodium 137    Lipid Panel     Component Value Date/Time   CHOL 126 07/30/2016 1143   CHOL 93 (L) 06/16/2014 0914   TRIG 154 (H) 07/30/2016 1143   TRIG 124 06/16/2014 0914   HDL 45 07/30/2016 1143   HDL 34 (L) 06/16/2014 0914   CHOLHDL 2.8 07/30/2016 1143   CHOLHDL 2.7 01/19/2015 1005   VLDL 32 (H) 01/19/2015 1005   LDLCALC 50 07/30/2016 1143   LDLCALC 34 06/16/2014 0914     Wt Readings from Last 3 Encounters:  08/18/20 201 lb (91.2 kg)  04/28/20 200 lb (90.7 kg)  01/12/20 192 lb 6.4 oz (87.3 kg)      Other studies Reviewed: Additional studies/ records that were reviewed today include: TTE 05/29/17 Review of the above records today demonstrates:  - Left ventricle: The  cavity size was normal. Wall thickness was   increased in a pattern of mild LVH. Systolic function was   severely reduced. The estimated ejection fraction was in the   range of 20% to 25%. There is akinesis of the inferolateral,   inferior, and inferoseptal myocardium. Features are consistent   with a pseudonormal left ventricular filling pattern, with   concomitant abnormal relaxation and increased filling pressure   (grade 2 diastolic dysfunction). - Aortic valve: A pericardial tissue valve bioprosthesis was   present and functioning normally. Peak velocity (S): 226 cm/s.  Mean gradient (S): 10 mm Hg. Valve area (VTI): 1.52 cm^2. Valve   area (Vmax): 1.5 cm^2. Valve area (Vmean): 1.59 cm^2. - Mitral valve: Calcified annulus. Mildly thickened leaflets .   There was mild regurgitation. - Left atrium: The atrium was severely dilated. - Right ventricle: Pacer wire or catheter noted in right ventricle. - Pulmonary arteries: Systolic pressure was mildly increased. PA   peak pressure: 35 mm Hg (S).  RHC/LHC 05/31/17  Prox LAD to Mid LAD lesion is 50% stenosed.  Mid LAD to Dist LAD lesion is 25% stenosed.  1st Mrg lesion is 50% stenosed.  Prox RCA to Dist RCA lesion is 100% stenosed.  And is large.  Prox Graft to Mid Graft lesion is 30% stenosed.  And is large.  The flow in the graft is reversed.  There is competitive flow.  And is large.  There is competitive flow.  Ost 1st Diag to 1st Diag lesion is 90% stenosed.   Findings:  Ao = 145/67 (99)  RA = 6 RV = 70/9 PA = 69/30 (46) PCW = 26 v = 37 Fick cardiac output/index =4.7/2.3 PVR = 4.3 FA sat = 98% PA sat = 63%, 60%  Assessment:  1. 3v CAD with stable revascularization with patent LIMA-LAD, SVG-diagonal and SVG-RCA 2. 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 Normal cardiac output - no evidence of thyrotoxicosis heart disease or PH   ASSESSMENT AND  PLAN:  1.  Chronic systolic heart failure due to ischemic cardiomyopathy: Status post Medtronic CRT-D implanted 04/22/2015.  Device functioning appropriately.  Currently on optimal medical therapy with carvedilol, hydralazine, Imdur.    2.  Persistent atrial fibrillation/flutter: Currently on Eliquis with a CHA2DS2-VASc of 5.  Unfortunately has been in atrial fibrillation for the past few weeks.  He feels weak fatigued and short of breath.  He would like to get back into normal rhythm.  We Chey Rachels plan for cardioversion.  3.  Ventricular tachycardia: Currently on sotalol and mexiletine.  ECG monitoring for high risk medication.  Unfortunately he does not have other medication options due to his comorbidities.  No further episodes.    4.  Coronary artery disease status post CABG: No current chest pain.  3.  Aortic stenosis: Status post AVR.  Plan per primary cardiology.  Current medicines are reviewed at length with the patient today.   The patient does not have concerns regarding his medicines.  The following changes were made today: None  Labs/ tests ordered today include:  Orders Placed This Encounter  Procedures  . EKG 12-Lead    Disposition:   FU with Jenya Putz 3 months  Signed, all of Verle Brillhart Meredith Leeds, MD  08/18/2020 2:51 PM     [facility] [address] [place] [address] [phone] (office) [phone] (fax)

## 2020-08-19 ENCOUNTER — Telehealth: Payer: Self-pay

## 2020-08-19 NOTE — Telephone Encounter (Signed)
I spoke with the patient and let him know that his monitor is working properly. I told him sometimes the lights come on when it is updating. I told him if the lights become a problem I will be more than happy to help him and call tech support to get additional help.

## 2020-08-23 ENCOUNTER — Telehealth: Payer: Self-pay | Admitting: Cardiology

## 2020-08-23 NOTE — Telephone Encounter (Signed)
Clarene Critchley is calling in with concerns on why her father has not been scheduled to get his heart shocked

## 2020-08-23 NOTE — Telephone Encounter (Signed)
Called pt and received permission to speak with daughter. Informed dtr that I would work on getting this scheduled for next week, worst case week after. Aware I will follow up tomorrow. Apologized for the delay.

## 2020-08-24 NOTE — Telephone Encounter (Signed)
Left message informing dtr that I got dad scheduled for DCCV next week, 5/26, and that I would be in touch Friday to go over instructions.

## 2020-08-26 NOTE — Telephone Encounter (Signed)
Left message for dtr to call me back to go over DCCV instructions.  Spoke to pt. DCCV instructions reviewed w/ him. Aware office will call to arrange 3 month follow up. Patient verbalized understanding and agreeable to plan.

## 2020-08-30 NOTE — Telephone Encounter (Signed)
Returned call to dtr, DCCV instructions reviewed w/ her. Offered to schedule 3 mo f/u w/ Dr. Curt Bears but she prefers we call dad to schedule. Aware we will be in touch to get this arranged. She verbalized understanding and agreeable to plan.

## 2020-08-30 NOTE — Telephone Encounter (Signed)
Follow up:     Patient daughter returning call back from last week to instructions for a DCCV

## 2020-09-01 ENCOUNTER — Encounter (HOSPITAL_COMMUNITY)
Admission: RE | Disposition: A | Discharge status: Home / Self Care | Payer: Self-pay | Source: Home / Self Care | Attending: Cardiology

## 2020-09-01 ENCOUNTER — Encounter (HOSPITAL_COMMUNITY): Payer: Self-pay | Admitting: Cardiology

## 2020-09-01 ENCOUNTER — Other Ambulatory Visit: Payer: Self-pay

## 2020-09-01 ENCOUNTER — Ambulatory Visit (HOSPITAL_COMMUNITY): Payer: Medicare HMO

## 2020-09-01 ENCOUNTER — Ambulatory Visit (HOSPITAL_COMMUNITY)
Admission: RE | Admit: 2020-09-01 | Discharge: 2020-09-01 | Disposition: A | Discharge status: Home / Self Care | Payer: Medicare HMO | Attending: Cardiology | Admitting: Cardiology

## 2020-09-01 DIAGNOSIS — I2510 Atherosclerotic heart disease of native coronary artery without angina pectoris: Secondary | ICD-10-CM | POA: Diagnosis not present

## 2020-09-01 DIAGNOSIS — I5022 Chronic systolic (congestive) heart failure: Secondary | ICD-10-CM | POA: Insufficient documentation

## 2020-09-01 DIAGNOSIS — I4892 Unspecified atrial flutter: Secondary | ICD-10-CM | POA: Insufficient documentation

## 2020-09-01 DIAGNOSIS — I255 Ischemic cardiomyopathy: Secondary | ICD-10-CM | POA: Diagnosis present

## 2020-09-01 DIAGNOSIS — Z7901 Long term (current) use of anticoagulants: Secondary | ICD-10-CM | POA: Diagnosis not present

## 2020-09-01 DIAGNOSIS — Z952 Presence of prosthetic heart valve: Secondary | ICD-10-CM | POA: Diagnosis not present

## 2020-09-01 DIAGNOSIS — I130 Hypertensive heart and chronic kidney disease with heart failure and stage 1 through stage 4 chronic kidney disease, or unspecified chronic kidney disease: Secondary | ICD-10-CM | POA: Diagnosis not present

## 2020-09-01 DIAGNOSIS — Z888 Allergy status to other drugs, medicaments and biological substances status: Secondary | ICD-10-CM | POA: Diagnosis not present

## 2020-09-01 DIAGNOSIS — I472 Ventricular tachycardia: Secondary | ICD-10-CM | POA: Insufficient documentation

## 2020-09-01 DIAGNOSIS — Z79899 Other long term (current) drug therapy: Secondary | ICD-10-CM | POA: Diagnosis not present

## 2020-09-01 DIAGNOSIS — I4819 Other persistent atrial fibrillation: Secondary | ICD-10-CM

## 2020-09-01 DIAGNOSIS — Z951 Presence of aortocoronary bypass graft: Secondary | ICD-10-CM | POA: Diagnosis not present

## 2020-09-01 DIAGNOSIS — I5042 Chronic combined systolic (congestive) and diastolic (congestive) heart failure: Secondary | ICD-10-CM | POA: Diagnosis not present

## 2020-09-01 DIAGNOSIS — Z95810 Presence of automatic (implantable) cardiac defibrillator: Secondary | ICD-10-CM | POA: Diagnosis not present

## 2020-09-01 DIAGNOSIS — N184 Chronic kidney disease, stage 4 (severe): Secondary | ICD-10-CM | POA: Diagnosis not present

## 2020-09-01 HISTORY — PX: CARDIOVERSION: SHX1299

## 2020-09-01 HISTORY — DX: Other complications of anesthesia, initial encounter: T88.59XA

## 2020-09-01 LAB — POCT I-STAT, CHEM 8
BUN: 44 mg/dL — ABNORMAL HIGH (ref 8–23)
BUN: 53 mg/dL — ABNORMAL HIGH (ref 8–23)
Calcium, Ion: 0.94 mmol/L — ABNORMAL LOW (ref 1.15–1.40)
Calcium, Ion: 1.06 mmol/L — ABNORMAL LOW (ref 1.15–1.40)
Chloride: 105 mmol/L (ref 98–111)
Chloride: 106 mmol/L (ref 98–111)
Creatinine, Ser: 2.4 mg/dL — ABNORMAL HIGH (ref 0.61–1.24)
Creatinine, Ser: 2.4 mg/dL — ABNORMAL HIGH (ref 0.61–1.24)
Glucose, Bld: 126 mg/dL — ABNORMAL HIGH (ref 70–99)
Glucose, Bld: 135 mg/dL — ABNORMAL HIGH (ref 70–99)
HCT: 41 % (ref 39.0–52.0)
HCT: 42 % (ref 39.0–52.0)
Hemoglobin: 13.9 g/dL (ref 13.0–17.0)
Hemoglobin: 14.3 g/dL (ref 13.0–17.0)
Potassium: 5.7 mmol/L — ABNORMAL HIGH (ref 3.5–5.1)
Potassium: 7.9 mmol/L (ref 3.5–5.1)
Sodium: 134 mmol/L — ABNORMAL LOW (ref 135–145)
Sodium: 137 mmol/L (ref 135–145)
TCO2: 25 mmol/L (ref 22–32)
TCO2: 26 mmol/L (ref 22–32)

## 2020-09-01 SURGERY — CARDIOVERSION
Anesthesia: Monitor Anesthesia Care

## 2020-09-01 MED ORDER — SODIUM CHLORIDE 0.9 % IV SOLN: INTRAVENOUS | Status: DC | PRN

## 2020-09-01 MED ORDER — LIDOCAINE HCL (CARDIAC) PF 100 MG/5ML IV SOSY
PREFILLED_SYRINGE | INTRAVENOUS | Status: DC | PRN
  Administered 2020-09-01: 80 mg via INTRATRACHEAL

## 2020-09-01 MED ORDER — SODIUM CHLORIDE 0.9 % IV SOLN: INTRAVENOUS | Status: DC

## 2020-09-01 MED ORDER — PROPOFOL 10 MG/ML IV BOLUS
INTRAVENOUS | Status: DC | PRN
  Administered 2020-09-01: 70 mg via INTRAVENOUS

## 2020-09-01 MED ORDER — SODIUM POLYSTYRENE SULFONATE 15 GM/60ML PO SUSP
30.0000 g | Freq: Once | ORAL | Status: DC
  Filled 2020-09-01: qty 120

## 2020-09-01 NOTE — Transfer of Care (Signed)
Immediate Anesthesia Transfer of Care Note  Patient: [name]  Procedure(s) Performed: CARDIOVERSION (N/A )  Patient Location: Endoscopy Unit  Anesthesia Type:General  Level of Consciousness: drowsy and patient cooperative  Airway & Oxygen Therapy: Patient Spontanous Breathing and Patient connected to face mask oxygen  Post-op Assessment: Report given to RN and Post -op Vital signs reviewed and stable  Post vital signs: Reviewed and stable  Last Vitals:  Vitals Value Taken Time  BP 142/77   Temp    Pulse 77   Resp 14   SpO2 100     Last Pain:  Vitals:   09/01/20 0818  TempSrc: Temporal  PainSc: 0-No pain         Complications: No complications documented.

## 2020-09-01 NOTE — Progress Notes (Signed)
Kayexalate 30 gm given to patient per DR Stanford Breed to be taken as soon as he got home. Son also instructed.

## 2020-09-01 NOTE — Procedures (Signed)
Electrical Cardioversion Procedure Note [name] [id number] April 22, 1932  Procedure: Electrical Cardioversion Indications:  Atrial Fibrillation  Procedure Details Consent: Risks of procedure as well as the alternatives and risks of each were explained to the (patient/caregiver).  Consent for procedure obtained. Time Out: Verified patient identification, verified procedure, site/side was marked, verified correct patient position, special equipment/implants available, medications/allergies/relevent history reviewed, required imaging and test results available.  Performed  Patient placed on cardiac monitor, pulse oximetry, supplemental oxygen as necessary.  Sedation given: Pt sedated by anesthesia with lidocaine 80 mg and diprovan 70 mg IV. Pacer pads placed anterior and posterior chest.  Cardioverted 1 time(s).  Cardioverted at [facility].  Evaluation Findings: Post procedure EKG shows: AV paced rhythm. Complications: None Patient did tolerate procedure well.  Preprocedure K 5.7; pt will be given kayexalate 30 gms po x 1; repeat bmet tomorrow. [name] 09/01/2020, 8:23 AM

## 2020-09-01 NOTE — H&P (Signed)
Office Visit  08/18/2020 [facility] [address]   [place], Ocie Doyne, MD  Cardiology  Persistent atrial fibrillation [facility]) +1 more  Dx  Referred by [name], MD  Reason for Visit    Additional Documentation  Vitals:  BP 126/72  Pulse 81  Ht 6' (1.829 m)  Wt 91.2 kg  BMI 27.26 kg/m  BSA 2.15 m    More Vitals  Flowsheets:  Anthropometrics,  MEWS Score,  NEWS    Encounter Info:  Billing Info,  History,  Allergies,  Detailed Report     All Notes   Progress Notes by Constance Haw, MD at 08/18/2020 2:00 PM  Author: Constance Haw, MD Author Type: Physician Filed: 08/18/2020 2:52 PM  Note Status: Signed Cosign: Cosign Not Required Encounter Date: 08/18/2020  Editor: Constance Haw, MD (Physician)             Expand AllCollapse All      Electrophysiology Office Note   Date:  08/18/2020   ID:  [name], DOB Feb 23, 1933, MRN [id number]  PCP:  [name], MD   Cardiologist:  [name] Primary Electrophysiologist: [name], MD        No chief complaint on file.    History of Present Illness: WESLEE FOGG is a 87 y.o. male who presents today for electrophysiology evaluation.     He has a history of hypertension, aortic stenosis status post AVR, chronic combined systolic and diastolic heart failure due to ischemic cardiomyopathy, persistent atrial fibrillation/flutter, coronary artery disease status post CABG.  He is status post Medtronic CRT-D with an upgrade 04/22/2015.  He is currently on sotalol and mexiletine for ventricular tachycardia.  Today, denies symptoms of palpitations, chest pain, shortness of breath, orthopnea, PND, lower extremity edema, claudication, dizziness, presyncope, syncope, bleeding, or neurologic sequela. The patient is tolerating medications without difficulties.  Unfortunately, he has gone into atrial fibrillation.  This occurred a few weeks ago based on device  interrogation.  He states that he had a significant change in his level of fatigue and shortness of breath.  His OptiVol shows that he has not been retaining fluid, but despite this, he is gotten significantly more short of breath and tired.  He does not remember any illness that could have flipped him into atrial fibrillation.      Past Medical History:  Diagnosis Date  . Arthritis    "minor everywhere" (04/22/2015)  . Asthma    "a touch"  . Barrett esophagus   . CAD (coronary artery disease)    a. s/p 3 vvessel CABG with LIMA to LAD, SVG to diagonal 1, SVG to RCA 12/09.  . Cardiac resynchronization therapy defibrillator (CRT-D) in place   . Chronic anticoagulation 01/23/2013  . Chronic combined systolic and diastolic CHF (congestive heart failure) (HCC)    dry weight 197-200lbs.  . CKD (chronic kidney disease) stage 3, GFR 30-59 ml/min (HCC) 08/23/2014  . DCM (dilated cardiomyopathy) ([place])    initially concerned for TTP amyloidosis but Tc-PYP scan 06/2017 was not consistent with amyloid and SPEP/UPEP with no M spike. EF 20-25% by echo 2019  . Diverticulitis 08/2019  . Dyslipidemia 01/23/2013  . Essential hypertension 01/23/2013  . GERD (gastroesophageal reflux disease)   . GI bleed 05/08/2017  . H/O: rheumatic fever   . Hepatitis 1957   "don't know what kind:  . History of hiatal hernia   . History of PFTs    a. Amiodarone started 5/16 >>  PFTs w/ DLCO 6/16:  FEV1 72% predicted, FEV1/FVC 66%, DLCO 66% >> minimal reversible obstructive airways disease with mild diffusion defect (suggestive of emphysema but absence of hyperinflation inconsistent with dx)  . Hyperkalemia 08/23/2014  . Hyperthyroidism 05/26/2017  . Leg weakness 01/28/2017  . Multiple thyroid nodules 08/13/2017  . Orthostatic hypotension   . Persistent atrial fibrillation ([place]) 01/23/2013  . Pleural effusion on right 05/26/2017  . Pneumonia 08/2014   "dr thought I may have had a touch"  . Pre-diabetes    . Protein-calorie malnutrition, severe 05/27/2017  . S/P AVR (aortic valve replacement) 2009   a. severe AS s/p AVR with pericardial tissue valve 2009.  Marland Kitchen Shingles 08/23/2014  . VT (ventricular tachycardia) ([place]) 05/06/2017        Past Surgical History:  Procedure Laterality Date  . AORTIC VALVE REPLACEMENT  with 23-mm Magna Ease pericardial valve, model number   with 23-mm Magna Ease pericardial valve, model [id number], serial number [id number]   . APPENDECTOMY  1940s  . BI-VENTRICULAR IMPLANTABLE CARDIOVERTER DEFIBRILLATOR  (CRT-D)  04/22/2015  . CARDIAC CATHETERIZATION N/A 11/04/2014   Procedure: Left Heart Cath and Cors/Grafts Angiography;  Surgeon: Leonie Man, MD;  Location: [facility];  Service: Cardiovascular;  Laterality: N/A;  . CARDIAC VALVE REPLACEMENT    . CARDIOVERSION N/A 01/27/2013   Procedure: CARDIOVERSION;  Surgeon: Sueanne Margarita, MD;  Location: [facility];  Service: Cardiovascular;  Laterality: N/A;  . CARDIOVERSION N/A 08/25/2014   Procedure: CARDIOVERSION;  Surgeon: Sanda Klein, MD;  Location: [facility];  Service: Cardiovascular;  Laterality: N/A;  . CARDIOVERSION N/A 05/28/2018   Procedure: CARDIOVERSION;  Surgeon: Pixie Casino, MD;  Location: [facility];  Service: Cardiovascular;  Laterality: N/A;  . CARDIOVERSION N/A 10/28/2019   Procedure: CARDIOVERSION;  Surgeon: Sueanne Margarita, MD;  Location: [facility];  Service: Cardiovascular;  Laterality: N/A;  . CATARACT EXTRACTION W/ INTRAOCULAR LENS  IMPLANT, BILATERAL Bilateral   . CHEST TUBE INSERTION Right 07/26/2017   Procedure: INSERTION PLEURAL DRAINAGE CATHETER - RIGHT;  Surgeon: Ivin Poot, MD;  Location: [place];  Service: Thoracic;  Laterality: Right;  . CORONARY ARTERY BYPASS GRAFT  03/10/2008   x 3 Dr. Roxan Hockey  . EP IMPLANTABLE DEVICE N/A 04/22/2015   Procedure: BiV ICD Insertion CRT-D;  Surgeon: [name], MD;  Location: [place] [facility];  Service:  Cardiovascular;  Laterality: N/A;  . ESOPHAGOGASTRODUODENOSCOPY (EGD) WITH ESOPHAGEAL DILATION  X1  . ESOPHAGOGASTRODUODENOSCOPY (EGD) WITH PROPOFOL N/A 05/09/2017   Procedure: ESOPHAGOGASTRODUODENOSCOPY (EGD) WITH PROPOFOL;  Surgeon: Carol Ada, MD;  Location: [place];  Service: Endoscopy;  Laterality: N/A;  . IR THORACENTESIS ASP PLEURAL SPACE W/IMG GUIDE  05/27/2017  . IR THORACENTESIS ASP PLEURAL SPACE W/IMG GUIDE  06/17/2017  . IR THORACENTESIS ASP PLEURAL SPACE W/IMG GUIDE  06/18/2017  . IR THORACENTESIS ASP PLEURAL SPACE W/IMG GUIDE  07/11/2017  . REMOVAL OF PLEURAL DRAINAGE CATHETER Right 09/13/2017   Procedure: REMOVAL OF PLEURAL DRAINAGE CATHETER;  Surgeon: Ivin Poot, MD;  Location: [facility];  Service: Thoracic;  Laterality: Right;  . RIGHT HEART CATH AND CORONARY/GRAFT ANGIOGRAPHY N/A 05/31/2017   Procedure: RIGHT HEART CATH AND CORONARY/GRAFT ANGIOGRAPHY;  Surgeon: Jolaine Artist, MD;  Location: [facility];  Service: Cardiovascular;  Laterality: N/A;  . TONSILLECTOMY             Current Outpatient Medications  Medication Sig Dispense Refill  . acetaminophen (TYLENOL) 500 MG tablet Take 1,000 mg by  mouth every 6 (six) hours as needed for moderate pain or headache.    Marland Kitchen apixaban (ELIQUIS) 2.5 MG TABS tablet Take 1 tablet (2.5 mg total) by mouth 2 (two) times daily. NEEDS APPT FOR FUTURE REFILLS 180 tablet 1  . atorvastatin (LIPITOR) 20 MG tablet TAKE 1 TABLET EVERY DAY  AT  6  PM 90 tablet 2  . carvedilol (COREG) 25 MG tablet TAKE 1 TABLET TWICE DAILY (DOSE INCREASE) 180 tablet 3  . famotidine (PEPCID) 40 MG tablet Take 40 mg by mouth every evening.     . furosemide (LASIX) 40 MG tablet Take 1 tablet (40 mg total) by mouth daily as needed for edema. 90 tablet 0  . hydrALAZINE (APRESOLINE) 25 MG tablet TAKE 1/2 TABLET TWICE DAILY BEFORE MEALS 90 tablet 0  . isosorbide mononitrate (IMDUR) 30 MG 24 hr tablet TAKE 1 TABLET EVERY DAY 90 tablet 2  .  mexiletine (MEXITIL) 150 MG capsule Take 2 capsules (300 mg total) by mouth every 12 (twelve) hours. 360 capsule 1  . Multiple Vitamin (MULTIVITAMIN WITH MINERALS) TABS tablet Take 1 tablet by mouth daily.     . Omega-3 Fatty Acids (FISH OIL) 1000 MG CAPS Take 2,000 mg by mouth 2 (two) times daily.    . pantoprazole (PROTONIX) 40 MG tablet Take 40 mg by mouth daily.    . sotalol (BETAPACE) 80 MG tablet TAKE 1 TABLET TWICE DAILY 180 tablet 2   No current facility-administered medications for this visit.    Allergies:   Ranolazine, Novocain [procaine], and Amiodarone   Social History:  The patient  reports that he has never smoked. He has never used smokeless tobacco. He reports previous alcohol use. He reports that he does not use drugs.   Family History:  The patient's family history includes Alzheimer's disease in his mother and another family member; COPD in his daughter and sister; Depression in his sister; Esophageal cancer in his sister; Heart disease in his sister; Hyperlipidemia in his sister; Hypertension in his sister.   ROS:  Please see the history of present illness.   Otherwise, review of systems is positive for none.   All other systems are reviewed and negative.   PHYSICAL EXAM: VS:  BP 126/72   Pulse 81   Ht 6' (1.829 m)   Wt 201 lb (91.2 kg)   BMI 27.26 kg/m  , BMI Body mass index is 27.26 kg/m. GEN: Well nourished, well developed, in no acute distress  HEENT: normal  Neck: no JVD, carotid bruits, or masses Cardiac: RRR; no murmurs, rubs, or gallops,no edema  Respiratory:  clear to auscultation bilaterally, normal work of breathing GI: soft, nontender, nondistended, + BS MS: no deformity or atrophy  Skin: warm and dry, device site well healed Neuro:  Strength and sensation are intact Psych: euthymic mood, full affect  EKG:  EKG is ordered today. Personal review of the ekg ordered shows atrial fibrillation, ventricular paced, rate 81 PVCs  Personal  review of the device interrogation today. Results in [place]: 09/08/2019: ALT 25 10/21/2019: Hemoglobin 12.5; Magnesium 2.3; Platelets 307 11/18/2019: BUN 17; Creatinine, Ser 1.75; Potassium 4.8; Sodium 137    Lipid Panel  Labs (Brief)          Component Value Date/Time   CHOL 126 07/30/2016 1143   CHOL 93 (L) 06/16/2014 0914   TRIG 154 (H) 07/30/2016 1143   TRIG 124 06/16/2014 0914   HDL 45 07/30/2016 1143   HDL  34 (L) 06/16/2014 0914   CHOLHDL 2.8 07/30/2016 1143   CHOLHDL 2.7 01/19/2015 1005   VLDL 32 (H) 01/19/2015 1005   LDLCALC 50 07/30/2016 1143   LDLCALC 34 06/16/2014 0914          Wt Readings from Last 3 Encounters:  08/18/20 201 lb (91.2 kg)  04/28/20 200 lb (90.7 kg)  01/12/20 192 lb 6.4 oz (87.3 kg)      Other studies Reviewed: Additional studies/ records that were reviewed today include: TTE 05/29/17 Review of the above records today demonstrates:  - Left ventricle: The cavity size was normal. Wall thickness was increased in a pattern of mild LVH. Systolic function was severely reduced. The estimated ejection fraction was in the range of 20% to 25%. There is akinesis of the inferolateral, inferior, and inferoseptal myocardium. Features are consistent with a pseudonormal left ventricular filling pattern, with concomitant abnormal relaxation and increased filling pressure (grade 2 diastolic dysfunction). - Aortic valve: A pericardial tissue valve bioprosthesis was present and functioning normally. Peak velocity (S): 226 cm/s. Mean gradient (S): 10 mm Hg. Valve area (VTI): 1.52 cm^2. Valve area (Vmax): 1.5 cm^2. Valve area (Vmean): 1.59 cm^2. - Mitral valve: Calcified annulus. Mildly thickened leaflets . There was mild regurgitation. - Left atrium: The atrium was severely dilated. - Right ventricle: Pacer wire or catheter noted in right ventricle. - Pulmonary arteries: Systolic pressure was mildly  increased. PA peak pressure: 35 mm Hg (S).  RHC/LHC 05/31/17  Prox LAD to Mid LAD lesion is 50% stenosed.  Mid LAD to Dist LAD lesion is 25% stenosed.  1st Mrg lesion is 50% stenosed.  Prox RCA to Dist RCA lesion is 100% stenosed.  And is large.  Prox Graft to Mid Graft lesion is 30% stenosed.  And is large.  The flow in the graft is reversed.  There is competitive flow.  And is large.  There is competitive flow.  Ost 1st Diag to 1st Diag lesion is 90% stenosed.  Findings:  Ao = 145/67 (99)  RA = 6 RV = 70/9 PA = 69/30 (46) PCW = 26 v = 37 Fick cardiac output/index =4.7/2.3 PVR = 4.3 FA sat = 98% PA sat = 63%, 60%  Assessment:  1. 3v CAD with stable revascularization with patent LIMA-LAD, SVG-diagonal and SVG-RCA 2. 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 Normal cardiac output - no evidence of thyrotoxicosis heart disease or PH   ASSESSMENT AND PLAN:  1.  Chronic systolic heart failure due to ischemic cardiomyopathy: Status post Medtronic CRT-D implanted 04/22/2015.  Device functioning appropriately.  Currently on optimal medical therapy with carvedilol, hydralazine, Imdur.    2.  Persistent atrial fibrillation/flutter: Currently on Eliquis with a CHA2DS2-VASc of 5.  Unfortunately has been in atrial fibrillation for the past few weeks.  He feels weak fatigued and short of breath.  He would like to get back into normal rhythm.  We will plan for cardioversion.  3.  Ventricular tachycardia: Currently on sotalol and mexiletine.  ECG monitoring for high risk medication.  Unfortunately he does not have other medication options due to his comorbidities.  No further episodes.    4.  Coronary artery disease status post CABG: No current chest pain.  3.  Aortic stenosis: Status post AVR.  Plan per primary cardiology.  Current medicines are reviewed at length with the patient today.   The patient does  not have concerns regarding his  medicines.  The following changes were made today: None  Labs/ tests ordered today include:     Orders Placed This Encounter  Procedures  . EKG 12-Lead    Disposition:   FU with Will Camnitz 3 months  Signed, all of [name], MD  08/18/2020 2:51 PM     [place] [id number] [phone] (office) [phone] (fax)        For DCCV; compliant with apixaban; no changes. Kirk Ruths

## 2020-09-01 NOTE — Discharge Instructions (Signed)
Electrical Cardioversion Electrical cardioversion is the delivery of a jolt of electricity to restore a normal rhythm to the heart. A rhythm that is too fast or is not regular keeps the heart from pumping well. In this procedure, sticky patches or metal paddles are placed on the chest to deliver electricity to the heart from a device. This procedure may be done in an emergency if:  There is low or no blood pressure as a result of the heart rhythm.  Normal rhythm must be restored as fast as possible to protect the brain and heart from further damage.  It may save a life. This may also be a scheduled procedure for irregular or fast heart rhythms that are not immediately life-threatening. Tell a health care provider about:  Any allergies you have.  All medicines you are taking, including vitamins, herbs, eye drops, creams, and over-the-counter medicines.  Any problems you or family members have had with anesthetic medicines.  Any blood disorders you have.  Any surgeries you have had.  Any medical conditions you have.  Whether you are pregnant or may be pregnant. What are the risks? Generally, this is a safe procedure. However, problems may occur, including:  Allergic reactions to medicines.  A blood clot that breaks free and travels to other parts of your body.  The possible return of an abnormal heart rhythm within hours or days after the procedure.  Your heart stopping (cardiac arrest). This is rare. What happens before the procedure? Medicines  Your health care provider may have you start taking: ? Blood-thinning medicines (anticoagulants) so your blood does not clot as easily. ? Medicines to help stabilize your heart rate and rhythm.  Ask your health care provider about: ? Changing or stopping your regular medicines. This is especially important if you are taking diabetes medicines or blood thinners. ? Taking medicines such as aspirin and ibuprofen. These medicines can  thin your blood. Do not take these medicines unless your health care provider tells you to take them. ? Taking over-the-counter medicines, vitamins, herbs, and supplements. General instructions  Follow instructions from your health care provider about eating or drinking restrictions.  Plan to have someone take you home from the hospital or clinic.  If you will be going home right after the procedure, plan to have someone with you for 24 hours.  Ask your health care provider what steps will be taken to help prevent infection. These may include washing your skin with a germ-killing soap. What happens during the procedure?  An IV will be inserted into one of your veins.  Sticky patches (electrodes) or metal paddles may be placed on your chest.  You will be given a medicine to help you relax (sedative).  An electrical shock will be delivered. The procedure may vary among health care providers and hospitals.   What can I expect after the procedure?  Your blood pressure, heart rate, breathing rate, and blood oxygen level will be monitored until you leave the hospital or clinic.  Your heart rhythm will be watched to make sure it does not change.  You may have some redness on the skin where the shocks were given. Follow these instructions at home:  Do not drive for 24 hours if you were given a sedative during your procedure.  Take over-the-counter and prescription medicines only as told by your health care provider.  Ask your health care provider how to check your pulse. Check it often.  Rest for 48 hours after the procedure  or as told by your health care provider.  Avoid or limit your caffeine use as told by your health care provider.  Keep all follow-up visits as told by your health care provider. This is important. Contact a health care provider if:  You feel like your heart is beating too quickly or your pulse is not regular.  You have a serious muscle cramp that does not go  away. Get help right away if:  You have discomfort in your chest.  You are dizzy or you feel faint.  You have trouble breathing or you are short of breath.  Your speech is slurred.  You have trouble moving an arm or leg on one side of your body.  Your fingers or toes turn cold or blue. Summary  Electrical cardioversion is the delivery of a jolt of electricity to restore a normal rhythm to the heart.  This procedure may be done right away in an emergency or may be a scheduled procedure if the condition is not an emergency.  Generally, this is a safe procedure.  After the procedure, check your pulse often as told by your health care provider. This information is not intended to replace advice given to you by your health care provider. Make sure you discuss any questions you have with your health care provider. Document Revised: 10/27/2018 Document Reviewed: 10/27/2018 Elsevier Patient Education  [place].

## 2020-09-01 NOTE — Interval H&P Note (Signed)
History and Physical Interval Note:  09/01/2020 8:25 AM  [name]  has presented today for surgery, with the diagnosis of AFIB.  The various methods of treatment have been discussed with the patient and family. After consideration of risks, benefits and other options for treatment, the patient has consented to  Procedure(s): CARDIOVERSION (N/A) as a surgical intervention.  The patient's history has been reviewed, patient examined, no change in status, stable for surgery.  I have reviewed the patient's chart and labs.  Questions were answered to the patient's satisfaction.     Kirk Ruths

## 2020-09-01 NOTE — Anesthesia Preprocedure Evaluation (Signed)
Anesthesia Evaluation  Patient identified by MRN, date of birth, ID band Patient awake    Reviewed: Allergy & Precautions, NPO status , Patient's Chart, lab work & pertinent test results  Airway Mallampati: II  TM Distance: >3 FB Neck ROM: Full    Dental  (+) Teeth Intact   Pulmonary asthma ,    Pulmonary exam normal        Cardiovascular hypertension, Pt. on medications and Pt. on home beta blockers + CAD, + CABG and +CHF  + dysrhythmias Atrial Fibrillation + Cardiac Defibrillator  Rhythm:Irregular Rate:Normal     Neuro/Psych negative neurological ROS  negative psych ROS   GI/Hepatic hiatal hernia, GERD  ,(+) Hepatitis -  Endo/Other  Hyperthyroidism   Renal/GU Renal disease  negative genitourinary   Musculoskeletal  (+) Arthritis ,   Abdominal (+)  Abdomen: soft. Bowel sounds: normal.  Peds  Hematology   Anesthesia Other Findings   Reproductive/Obstetrics                             Anesthesia Physical Anesthesia Plan  ASA: III  Anesthesia Plan: General   Post-op Pain Management:    Induction: Intravenous  PONV Risk Score and Plan: 2 and Propofol infusion and Treatment may vary due to age or medical condition  Airway Management Planned: Simple Face Mask, Natural Airway and Nasal Cannula  Additional Equipment: None  Intra-op Plan:   Post-operative Plan:   Informed Consent: I have reviewed the patients History and Physical, chart, labs and discussed the procedure including the risks, benefits and alternatives for the proposed anesthesia with the patient or authorized representative who has indicated his/her understanding and acceptance.     Dental advisory given  Plan Discussed with: CRNA  Anesthesia Plan Comments:         Anesthesia Quick Evaluation

## 2020-09-01 NOTE — Anesthesia Procedure Notes (Signed)
Procedure Name: General with mask airway Date/Time: 09/01/2020 9:24 AM Performed by: Kathryne Hitch, CRNA Pre-anesthesia Checklist: Patient identified, Emergency Drugs available, Suction available and Patient being monitored Patient Re-evaluated:Patient Re-evaluated prior to induction Oxygen Delivery Method: Simple face mask Preoxygenation: Pre-oxygenation with 100% oxygen Induction Type: IV induction Dental Injury: Teeth and Oropharynx as per pre-operative assessment

## 2020-09-01 NOTE — Anesthesia Postprocedure Evaluation (Signed)
Anesthesia Post Note  Patient: [name]  Procedure(s) Performed: CARDIOVERSION (N/A )     Patient location during evaluation: Endoscopy Anesthesia Type: General Level of consciousness: awake and alert Pain management: pain level controlled Vital Signs Assessment: post-procedure vital signs reviewed and stable Respiratory status: spontaneous breathing, nonlabored ventilation, respiratory function stable and patient connected to nasal cannula oxygen Cardiovascular status: blood pressure returned to baseline and stable Postop Assessment: no apparent nausea or vomiting Anesthetic complications: no   No complications documented.  Last Vitals:  Vitals:   09/01/20 0945 09/01/20 1000  BP: (!) 117/47 (!) 152/64  Pulse: 72 75  Resp: 16 14  Temp:    SpO2: 100% 100%    Last Pain:  Vitals:   09/01/20 1000  TempSrc:   PainSc: 0-No pain                 Belenda Cruise P Abron Neddo

## 2020-09-04 ENCOUNTER — Encounter (HOSPITAL_COMMUNITY): Payer: Self-pay | Admitting: Cardiology

## 2020-09-08 NOTE — Progress Notes (Signed)
Remote ICD transmission.

## 2020-09-12 ENCOUNTER — Other Ambulatory Visit: Payer: Self-pay | Admitting: Cardiology

## 2020-09-12 DIAGNOSIS — I5042 Chronic combined systolic (congestive) and diastolic (congestive) heart failure: Secondary | ICD-10-CM

## 2020-10-17 ENCOUNTER — Other Ambulatory Visit (HOSPITAL_COMMUNITY): Payer: Self-pay | Admitting: Internal Medicine

## 2020-10-17 ENCOUNTER — Other Ambulatory Visit: Payer: Self-pay | Admitting: Cardiology

## 2020-10-18 NOTE — Telephone Encounter (Signed)
Follow up appointment scheduled with Dr Radford Pax for patient in November.

## 2020-10-18 NOTE — Telephone Encounter (Signed)
Pt last saw Dr Curt Bears 08/18/20, last labs 09/01/20 Creat 2.4, age 87, weight 88kg, based on specified criteria pt is on appropriate dosage of Eliquis 2.31m BID.  Will refill rx.

## 2020-11-03 DIAGNOSIS — D6869 Other thrombophilia: Secondary | ICD-10-CM | POA: Diagnosis not present

## 2020-11-03 DIAGNOSIS — I255 Ischemic cardiomyopathy: Secondary | ICD-10-CM | POA: Diagnosis not present

## 2020-11-03 DIAGNOSIS — I10 Essential (primary) hypertension: Secondary | ICD-10-CM | POA: Diagnosis not present

## 2020-11-03 DIAGNOSIS — I4891 Unspecified atrial fibrillation: Secondary | ICD-10-CM | POA: Diagnosis not present

## 2020-11-03 DIAGNOSIS — I2510 Atherosclerotic heart disease of native coronary artery without angina pectoris: Secondary | ICD-10-CM | POA: Diagnosis not present

## 2020-11-03 DIAGNOSIS — I509 Heart failure, unspecified: Secondary | ICD-10-CM | POA: Diagnosis not present

## 2020-11-03 DIAGNOSIS — E7800 Pure hypercholesterolemia, unspecified: Secondary | ICD-10-CM | POA: Diagnosis not present

## 2020-11-03 DIAGNOSIS — E1122 Type 2 diabetes mellitus with diabetic chronic kidney disease: Secondary | ICD-10-CM | POA: Diagnosis not present

## 2020-11-03 DIAGNOSIS — N184 Chronic kidney disease, stage 4 (severe): Secondary | ICD-10-CM | POA: Diagnosis not present

## 2020-11-15 ENCOUNTER — Ambulatory Visit (INDEPENDENT_AMBULATORY_CARE_PROVIDER_SITE_OTHER): Payer: Medicare HMO

## 2020-11-15 DIAGNOSIS — I255 Ischemic cardiomyopathy: Secondary | ICD-10-CM | POA: Diagnosis not present

## 2020-11-15 LAB — CUP PACEART REMOTE DEVICE CHECK
Battery Remaining Longevity: 17 mo
Battery Voltage: 2.91 V
Brady Statistic AP VP Percent: 98.85 %
Brady Statistic AP VS Percent: 0.89 %
Brady Statistic AS VP Percent: 0.25 %
Brady Statistic AS VS Percent: 0.01 %
Brady Statistic RA Percent Paced: 99.09 %
Brady Statistic RV Percent Paced: 98.62 %
Date Time Interrogation Session: 20220809043825
HighPow Impedance: 70 Ohm
Implantable Lead Implant Date: 20170113
Implantable Lead Implant Date: 20170113
Implantable Lead Implant Date: 20170113
Implantable Lead Location: 753858
Implantable Lead Location: 753859
Implantable Lead Location: 753860
Implantable Lead Model: 4298
Implantable Lead Model: 5076
Implantable Pulse Generator Implant Date: 20170113
Lead Channel Impedance Value: 1026 Ohm
Lead Channel Impedance Value: 1026 Ohm
Lead Channel Impedance Value: 380 Ohm
Lead Channel Impedance Value: 437 Ohm
Lead Channel Impedance Value: 437 Ohm
Lead Channel Impedance Value: 456 Ohm
Lead Channel Impedance Value: 456 Ohm
Lead Channel Impedance Value: 456 Ohm
Lead Channel Impedance Value: 570 Ohm
Lead Channel Impedance Value: 646 Ohm
Lead Channel Impedance Value: 779 Ohm
Lead Channel Impedance Value: 779 Ohm
Lead Channel Impedance Value: 969 Ohm
Lead Channel Pacing Threshold Amplitude: 0.375 V
Lead Channel Pacing Threshold Amplitude: 0.625 V
Lead Channel Pacing Threshold Amplitude: 0.75 V
Lead Channel Pacing Threshold Pulse Width: 0.4 ms
Lead Channel Pacing Threshold Pulse Width: 0.4 ms
Lead Channel Pacing Threshold Pulse Width: 0.4 ms
Lead Channel Sensing Intrinsic Amplitude: 1.5 mV
Lead Channel Sensing Intrinsic Amplitude: 1.5 mV
Lead Channel Sensing Intrinsic Amplitude: 8.125 mV
Lead Channel Sensing Intrinsic Amplitude: 8.125 mV
Lead Channel Setting Pacing Amplitude: 1.5 V
Lead Channel Setting Pacing Amplitude: 1.75 V
Lead Channel Setting Pacing Amplitude: 2 V
Lead Channel Setting Pacing Pulse Width: 0.4 ms
Lead Channel Setting Pacing Pulse Width: 0.4 ms
Lead Channel Setting Sensing Sensitivity: 0.3 mV

## 2020-11-21 ENCOUNTER — Other Ambulatory Visit: Payer: Self-pay | Admitting: Cardiology

## 2020-11-21 ENCOUNTER — Other Ambulatory Visit: Payer: Self-pay | Admitting: Physician Assistant

## 2020-12-05 ENCOUNTER — Other Ambulatory Visit: Payer: Self-pay | Admitting: Cardiology

## 2020-12-08 NOTE — Progress Notes (Signed)
Remote ICD transmission.   

## 2020-12-13 ENCOUNTER — Encounter: Payer: Medicare HMO | Admitting: Student

## 2020-12-13 ENCOUNTER — Encounter: Payer: Medicare HMO | Admitting: Physician Assistant

## 2020-12-19 ENCOUNTER — Telehealth: Payer: Self-pay | Admitting: Cardiology

## 2020-12-19 NOTE — Telephone Encounter (Signed)
*  STAT* If patient is at the pharmacy, call can be transferred to refill team.   1. Which medications need to be refilled? (please list name of each medication and dose if known)  carvedilol (COREG) 25 MG tablet isosorbide mononitrate (IMDUR) 30 MG 24 hr tablet sotalol (BETAPACE) 80 MG tablet  2. Which pharmacy/location (including street and city if local pharmacy) is medication to be sent to? [facility] - [place], [place]  3. Do they need a 30 day or 90 day supply? 70 with refills   Per daughter the pharmacy never got the new rx. Please re-send the rx

## 2020-12-19 NOTE — Telephone Encounter (Signed)
Called pt's daughter to inform them that the pt has requested a refill too soon and that the pharmacist stated that pt will be able to refill medication on 12/26/20. I advised the daughter that if they have any other problems, questions or concerns, to please give our office a call. Daughter verbalized understanding.

## 2020-12-20 NOTE — Progress Notes (Signed)
Electrophysiology Office Note Date: 12/20/2020  ID:  [name], DOB 1932-10-22, MRN [id number]  PCP: [name], MD Primary Cardiologist: [name], MD Electrophysiologist: Constance Haw, MD   CC: Routine ICD follow-up  [name] is a 87 y.o. male seen today for [name] Meredith Leeds, MD for routine electrophysiology followup.  Since last being seen in our clinic the patient reports doing OK.  he denies chest pain, palpitations, dyspnea, PND, orthopnea, nausea, vomiting, dizziness, syncope, edema, weight gain, or early satiety. He has not had ICD shocks.   Device History: MDT CRT-D, implanted1/13/17 + hx of numerous appropriate therapies VT     AAD Hx: Amiodarone stopped Jan 2019, 2/2 SOB,  No clear finding of amio tox >> mexiletine started More [place] and sotalol was added Dec 2020  Past Medical History:  Diagnosis Date   Arthritis    "minor everywhere" (04/22/2015)   Asthma    "a touch"   Barrett esophagus    CAD (coronary artery disease)    a. s/p 3 vvessel CABG with LIMA to LAD, SVG to diagonal 1, SVG to RCA 12/09.   Cardiac resynchronization therapy defibrillator (CRT-D) in place    Chronic anticoagulation 01/23/2013   Chronic combined systolic and diastolic CHF (congestive heart failure) (HCC)    dry weight 197-200lbs.   CKD (chronic kidney disease) stage 3, GFR 30-59 ml/min (HCC) 3/36/1224   Complication of anesthesia    DCM (dilated cardiomyopathy) ([place])    initially concerned for TTP amyloidosis but Tc-PYP scan 06/2017 was not consistent with amyloid and SPEP/UPEP with no M spike. EF 20-25% by echo 2019   Diverticulitis 08/2019   Dyslipidemia 01/23/2013   Essential hypertension 01/23/2013   GERD (gastroesophageal reflux disease)    GI bleed 05/08/2017   H/O: rheumatic fever    Hepatitis 1957   "don't know what kind:   History of hiatal hernia    History of PFTs    a. Amiodarone started 5/16 >> PFTs w/ DLCO 6/16:  FEV1 72% predicted,  FEV1/FVC 66%, DLCO 66% >> minimal reversible obstructive airways disease with mild diffusion defect (suggestive of emphysema but absence of hyperinflation inconsistent with dx)   Hyperkalemia 08/23/2014   Hyperthyroidism 05/26/2017   Leg weakness 01/28/2017   Multiple thyroid nodules 08/13/2017   Orthostatic hypotension    Persistent atrial fibrillation ([place]) 01/23/2013   Pleural effusion on right 05/26/2017   Pneumonia 08/2014   "dr thought I may have had a touch"   Pre-diabetes    Protein-calorie malnutrition, severe 05/27/2017   S/P AVR (aortic valve replacement) 2009   a. severe AS s/p AVR with pericardial tissue valve 2009.   Shingles 08/23/2014   VT (ventricular tachycardia) ([place]) 05/06/2017   Past Surgical History:  Procedure Laterality Date   AORTIC VALVE REPLACEMENT  with 23-mm Magna Ease pericardial valve, model number   with 23-mm Magna Ease pericardial valve, model [id number], serial number [id number]    APPENDECTOMY  1940s   BI-VENTRICULAR IMPLANTABLE CARDIOVERTER DEFIBRILLATOR  (CRT-D)  04/22/2015   CARDIAC CATHETERIZATION N/A 11/04/2014   Procedure: Left Heart Cath and Cors/Grafts Angiography;  Surgeon: Leonie Man, MD;  Location: [facility] [facility];  Service: Cardiovascular;  Laterality: N/A;   CARDIAC VALVE REPLACEMENT     CARDIOVERSION N/A 01/27/2013   Procedure: CARDIOVERSION;  Surgeon: Sueanne Margarita, MD;  Location: [place];  Service: Cardiovascular;  Laterality: N/A;   CARDIOVERSION N/A 08/25/2014   Procedure: CARDIOVERSION;  Surgeon: Sanda Klein, MD;  Location: [address];  Service: Cardiovascular;  Laterality: N/A;   CARDIOVERSION N/A 05/28/2018   Procedure: CARDIOVERSION;  Surgeon: Pixie Casino, MD;  Location: [place];  Service: Cardiovascular;  Laterality: N/A;   CARDIOVERSION N/A 10/28/2019   Procedure: CARDIOVERSION;  Surgeon: Sueanne Margarita, MD;  Location: [facility] ENDOSCOPY;  Service: Cardiovascular;  Laterality: N/A;   CARDIOVERSION N/A 09/01/2020    Procedure: CARDIOVERSION;  Surgeon: Lelon Perla, MD;  Location: [facility] ENDOSCOPY;  Service: Cardiovascular;  Laterality: N/A;   CATARACT EXTRACTION W/ INTRAOCULAR LENS  IMPLANT, BILATERAL Bilateral    CHEST TUBE INSERTION Right 07/26/2017   Procedure: INSERTION PLEURAL DRAINAGE CATHETER - RIGHT;  Surgeon: Ivin Poot, MD;  Location: [place];  Service: Thoracic;  Laterality: Right;   CORONARY ARTERY BYPASS GRAFT  03/10/2008   x 3 Dr. Roxan Hockey   EP IMPLANTABLE DEVICE N/A 04/22/2015   Procedure: BiV ICD Insertion CRT-D;  Surgeon: [name] Meredith Leeds, MD;  Location: [place] [facility];  Service: Cardiovascular;  Laterality: N/A;   ESOPHAGOGASTRODUODENOSCOPY (EGD) WITH ESOPHAGEAL DILATION  X1   ESOPHAGOGASTRODUODENOSCOPY (EGD) WITH PROPOFOL N/A 05/09/2017   Procedure: ESOPHAGOGASTRODUODENOSCOPY (EGD) WITH PROPOFOL;  Surgeon: Carol Ada, MD;  Location: [place];  Service: Endoscopy;  Laterality: N/A;   IR THORACENTESIS ASP PLEURAL SPACE W/IMG GUIDE  05/27/2017   IR THORACENTESIS ASP PLEURAL SPACE W/IMG GUIDE  06/17/2017   IR THORACENTESIS ASP PLEURAL SPACE W/IMG GUIDE  06/18/2017   IR THORACENTESIS ASP PLEURAL SPACE W/IMG GUIDE  07/11/2017   REMOVAL OF PLEURAL DRAINAGE CATHETER Right 09/13/2017   Procedure: REMOVAL OF PLEURAL DRAINAGE CATHETER;  Surgeon: Ivin Poot, MD;  Location: [place];  Service: Thoracic;  Laterality: Right;   RIGHT HEART CATH AND CORONARY/GRAFT ANGIOGRAPHY N/A 05/31/2017   Procedure: RIGHT HEART CATH AND CORONARY/GRAFT ANGIOGRAPHY;  Surgeon: Jolaine Artist, MD;  Location: [place] [facility];  Service: Cardiovascular;  Laterality: N/A;   TONSILLECTOMY      Current Outpatient Medications  Medication Sig Dispense Refill   apixaban (ELIQUIS) 2.5 MG TABS tablet Take 1 tablet (2.5 mg total) by mouth 2 (two) times daily. 180 tablet 1   acetaminophen (TYLENOL) 500 MG tablet Take 1,000 mg by mouth every 6 (six) hours as needed for moderate pain or headache.     albuterol  (VENTOLIN HFA) 108 (90 Base) MCG/ACT inhaler Inhale 1-2 puffs into the lungs every 6 (six) hours as needed for wheezing or shortness of breath.     carvedilol (COREG) 25 MG tablet Take 1 tablet (25 mg total) by mouth 2 (two) times daily with a meal. TAKE 1 TABLET TWICE DAILY (DOSE INCREASE) 180 tablet 3   famotidine (PEPCID) 40 MG tablet Take 40 mg by mouth every evening.      furosemide (LASIX) 40 MG tablet TAKE 1 TABLET (40 MG TOTAL) BY MOUTH DAILY AS NEEDED FOR EDEMA. 90 tablet 0   hydrALAZINE (APRESOLINE) 25 MG tablet Take 0.5 tablets (12.5 mg total) by mouth in the morning and at bedtime. 90 tablet 1   isosorbide mononitrate (IMDUR) 30 MG 24 hr tablet Take 1 tablet (30 mg total) by mouth daily. 90 tablet 3   mexiletine (MEXITIL) 150 MG capsule TAKE 2 CAPSULES BY MOUTH EVERY 12 HOURS. 180 capsule 3   Multiple Vitamin (MULTIVITAMIN WITH MINERALS) TABS tablet Take 1 tablet by mouth daily.      Omega-3 Fatty Acids (FISH OIL) 1000 MG CAPS Take 2,000 mg by mouth 2 (two) times daily.  pantoprazole (PROTONIX) 40 MG tablet Take 40 mg by mouth daily.     rosuvastatin (CRESTOR) 20 MG tablet Take 20 mg by mouth daily.     sotalol (BETAPACE) 80 MG tablet TAKE 1 TABLET TWICE DAILY 180 tablet 2   No current facility-administered medications for this visit.    Allergies:   Ranolazine, Novocain [procaine], and Amiodarone   Social History: Social History   Socioeconomic History   Marital status: Married    Spouse name: Not on file   Number of children: Not on file   Years of education: Not on file   Highest education level: Not on file  Occupational History   Not on file  Tobacco Use   Smoking status: Never   Smokeless tobacco: Never  Vaping Use   Vaping Use: Never used  Substance and Sexual Activity   Alcohol use: Not Currently    Comment: 04/22/2015 "nothing since the 1980s; never had a problem w/it"   Drug use: No   Sexual activity: Not Currently  Other Topics Concern   Not on file   Social History Narrative   Married, lives with spouse [name]   4 children, 2 boys and 2 girls > one daughter passed   OCCUPATION: retired, Pension scheme manager for [employer] Strain: Not on [employer] Insecurity: Not on file  Transportation Needs: Not on file  Physical Activity: Not on file  Stress: Not on file  Social Connections: Not on file  Intimate Partner Violence: Not on file    Family History: Family History  Problem Relation Age of Onset   Alzheimer's disease Mother    COPD Daughter    Alzheimer's disease Other    COPD Sister    Depression Sister    Heart disease Sister    Hyperlipidemia Sister    Hypertension Sister    Esophageal cancer Sister        + smoker    Review of Systems: All other systems reviewed and are otherwise negative except as noted above.   Physical Exam: There were no vitals filed for this visit.   GEN- The patient is well appearing, alert and oriented x 3 today.   HEENT: normocephalic, atraumatic; sclera clear, conjunctiva pink; hearing intact; oropharynx clear; neck supple, no JVP Lymph- no cervical lymphadenopathy Lungs- Clear to ausculation bilaterally, normal work of breathing.  No wheezes, rales, rhonchi Heart- Regular rate and rhythm, no murmurs, rubs or gallops, PMI not laterally displaced GI- soft, non-tender, non-distended, bowel sounds present, no hepatosplenomegaly Extremities- no clubbing or cyanosis. No edema; DP/PT/radial pulses 2+ bilaterally MS- no significant deformity or atrophy Skin- warm and dry, no rash or lesion; ICD pocket well healed Psych- euthymic mood, full affect Neuro- strength and sensation are intact  ICD interrogation- reviewed in detail today,  See PACEART report  EKG:  EKG is not ordered today.   Recent Labs: 09/01/2020: BUN 44; Creatinine, Ser 2.40; Hemoglobin 14.3; Potassium 5.7; Sodium 137   Wt Readings from Last 3 Encounters:   09/01/20 194 lb (88 kg)  08/18/20 201 lb (91.2 kg)  04/28/20 200 lb (90.7 kg)     Other studies Reviewed: Additional studies/ records that were reviewed today include: Previous EP office notes.   Assessment and Plan:  1.  Chronic systolic dysfunction s/p Medtronic CRT-D  euvolemic today Stable on an appropriate medical regimen Normal ICD function See Pace Art report No changes today   2.  Persistent  atrial fibrillation/flutter:  Remains in NSR today by device.  No further since [facility]. Continue Eliquis with a CHA2DS2-VASc of 5   3.  Ventricular tachycardia:  Currently on sotalol and mexiletine.    Unfortunately he does not have other medication options due to his comorbidities.   No further episodes.    4.  Coronary artery disease status post CABG:  No current chest pain   5.  Aortic stenosis: Status post AVR Per primary cardiology  Current medicines are reviewed at length with the patient today.   The patient does not have concerns regarding his medicines.  The following changes were made today:  none  Labs/ tests ordered today include:  Orders Placed This Encounter  Procedures   CUP [place]    Disposition:   Follow up with Dr. Curt Bears in 6 months  Signed, Shirley Friar, PA-C  12/20/2020 2:46 PM  [place] [address] [place] [address] [phone] (office) [phone] (fax)

## 2020-12-21 ENCOUNTER — Ambulatory Visit (INDEPENDENT_AMBULATORY_CARE_PROVIDER_SITE_OTHER): Payer: Medicare HMO | Admitting: Student

## 2020-12-21 ENCOUNTER — Other Ambulatory Visit: Payer: Self-pay

## 2020-12-21 ENCOUNTER — Encounter: Payer: Self-pay | Admitting: Student

## 2020-12-21 VITALS — BP 122/64 | HR 83 | Ht 72.0 in | Wt 201.0 lb

## 2020-12-21 DIAGNOSIS — I5042 Chronic combined systolic (congestive) and diastolic (congestive) heart failure: Secondary | ICD-10-CM | POA: Diagnosis not present

## 2020-12-21 DIAGNOSIS — I472 Ventricular tachycardia, unspecified: Secondary | ICD-10-CM

## 2020-12-21 DIAGNOSIS — I4819 Other persistent atrial fibrillation: Secondary | ICD-10-CM | POA: Diagnosis not present

## 2020-12-21 DIAGNOSIS — N184 Chronic kidney disease, stage 4 (severe): Secondary | ICD-10-CM | POA: Diagnosis not present

## 2020-12-21 DIAGNOSIS — N1832 Chronic kidney disease, stage 3b: Secondary | ICD-10-CM

## 2020-12-21 DIAGNOSIS — I255 Ischemic cardiomyopathy: Secondary | ICD-10-CM | POA: Diagnosis not present

## 2020-12-21 LAB — CUP PACEART INCLINIC DEVICE CHECK
Battery Remaining Longevity: 19 mo
Battery Voltage: 2.91 V
Brady Statistic AP VP Percent: 99.11 %
Brady Statistic AP VS Percent: 0.67 %
Brady Statistic AS VP Percent: 0.21 %
Brady Statistic AS VS Percent: 0.01 %
Brady Statistic RA Percent Paced: 99.31 %
Brady Statistic RV Percent Paced: 98.98 %
Date Time Interrogation Session: 20220914110800
HighPow Impedance: 77 Ohm
Implantable Lead Implant Date: 20170113
Implantable Lead Implant Date: 20170113
Implantable Lead Implant Date: 20170113
Implantable Lead Location: 753858
Implantable Lead Location: 753859
Implantable Lead Location: 753860
Implantable Lead Model: 4298
Implantable Lead Model: 5076
Implantable Pulse Generator Implant Date: 20170113
Lead Channel Impedance Value: 1026 Ohm
Lead Channel Impedance Value: 399 Ohm
Lead Channel Impedance Value: 437 Ohm
Lead Channel Impedance Value: 456 Ohm
Lead Channel Impedance Value: 456 Ohm
Lead Channel Impedance Value: 494 Ohm
Lead Channel Impedance Value: 494 Ohm
Lead Channel Impedance Value: 532 Ohm
Lead Channel Impedance Value: 627 Ohm
Lead Channel Impedance Value: 760 Ohm
Lead Channel Impedance Value: 779 Ohm
Lead Channel Impedance Value: 950 Ohm
Lead Channel Impedance Value: 950 Ohm
Lead Channel Pacing Threshold Amplitude: 0.375 V
Lead Channel Pacing Threshold Amplitude: 0.625 V
Lead Channel Pacing Threshold Amplitude: 0.75 V
Lead Channel Pacing Threshold Pulse Width: 0.4 ms
Lead Channel Pacing Threshold Pulse Width: 0.4 ms
Lead Channel Pacing Threshold Pulse Width: 0.4 ms
Lead Channel Sensing Intrinsic Amplitude: 0.625 mV
Lead Channel Sensing Intrinsic Amplitude: 4 mV
Lead Channel Sensing Intrinsic Amplitude: 8.875 mV
Lead Channel Sensing Intrinsic Amplitude: 9.125 mV
Lead Channel Setting Pacing Amplitude: 1.5 V
Lead Channel Setting Pacing Amplitude: 1.75 V
Lead Channel Setting Pacing Amplitude: 2 V
Lead Channel Setting Pacing Pulse Width: 0.4 ms
Lead Channel Setting Pacing Pulse Width: 0.4 ms
Lead Channel Setting Sensing Sensitivity: 0.3 mV

## 2020-12-21 NOTE — Patient Instructions (Signed)
Medication Instructions:  Your physician recommends that you continue on your current medications as directed. Please refer to the Current Medication list given to you today.  *If you need a refill on your cardiac medications before your next appointment, please call your pharmacy*   Lab Work: None If you have labs (blood work) drawn today and your tests are completely normal, you will receive your results only by: [place] (if you have MyChart) OR A paper copy in the mail If you have any lab test that is abnormal or we need to change your treatment, we will call you to review the results.   Follow-Up: At [facility], you and your health needs are our priority.  As part of our continuing mission to provide you with exceptional heart care, we have created designated Provider Care Teams.  These Care Teams include your primary Cardiologist (physician) and Advanced Practice Providers (APPs -  Physician Assistants and Nurse Practitioners) who all work together to provide you with the care you need, when you need it.   Your next appointment:   6 month(s)  The format for your next appointment:   In Person  Provider:   You may see [name], MD or one of the following Advanced Practice Providers on your designated Care Team:   Tommye Standard, [place] Legrand Como "[facility]" [place], [place]

## 2020-12-26 DIAGNOSIS — I129 Hypertensive chronic kidney disease with stage 1 through stage 4 chronic kidney disease, or unspecified chronic kidney disease: Secondary | ICD-10-CM | POA: Diagnosis not present

## 2020-12-26 DIAGNOSIS — E1122 Type 2 diabetes mellitus with diabetic chronic kidney disease: Secondary | ICD-10-CM | POA: Diagnosis not present

## 2020-12-26 DIAGNOSIS — I5022 Chronic systolic (congestive) heart failure: Secondary | ICD-10-CM | POA: Diagnosis not present

## 2020-12-26 DIAGNOSIS — E875 Hyperkalemia: Secondary | ICD-10-CM | POA: Diagnosis not present

## 2020-12-26 DIAGNOSIS — N184 Chronic kidney disease, stage 4 (severe): Secondary | ICD-10-CM | POA: Diagnosis not present

## 2021-01-02 DIAGNOSIS — Z23 Encounter for immunization: Secondary | ICD-10-CM | POA: Diagnosis not present

## 2021-02-07 ENCOUNTER — Encounter: Payer: Self-pay | Admitting: Cardiology

## 2021-02-07 ENCOUNTER — Ambulatory Visit: Payer: Medicare HMO | Admitting: Cardiology

## 2021-02-07 ENCOUNTER — Other Ambulatory Visit: Payer: Self-pay

## 2021-02-07 VITALS — BP 128/76 | HR 80 | Ht 72.0 in | Wt 204.4 lb

## 2021-02-07 DIAGNOSIS — I4819 Other persistent atrial fibrillation: Secondary | ICD-10-CM

## 2021-02-07 DIAGNOSIS — I2510 Atherosclerotic heart disease of native coronary artery without angina pectoris: Secondary | ICD-10-CM | POA: Diagnosis not present

## 2021-02-07 DIAGNOSIS — I350 Nonrheumatic aortic (valve) stenosis: Secondary | ICD-10-CM | POA: Diagnosis not present

## 2021-02-07 DIAGNOSIS — I472 Ventricular tachycardia, unspecified: Secondary | ICD-10-CM | POA: Diagnosis not present

## 2021-02-07 DIAGNOSIS — J90 Pleural effusion, not elsewhere classified: Secondary | ICD-10-CM

## 2021-02-07 DIAGNOSIS — E785 Hyperlipidemia, unspecified: Secondary | ICD-10-CM | POA: Diagnosis not present

## 2021-02-07 DIAGNOSIS — N1832 Chronic kidney disease, stage 3b: Secondary | ICD-10-CM

## 2021-02-07 DIAGNOSIS — I5042 Chronic combined systolic (congestive) and diastolic (congestive) heart failure: Secondary | ICD-10-CM

## 2021-02-07 NOTE — Addendum Note (Signed)
Addended by: Patterson Hammersmith A on: 02/07/2021 01:23 PM   Modules accepted: Orders

## 2021-02-07 NOTE — Progress Notes (Signed)
Date:  02/07/2021   ID:  [name], DOB [birth year], MRN [id number]  PCP:  [name], MD  Cardiologist:  [name], MD  Electrophysiologist:  Constance Haw, MD   Chief Complaint:  CHF, VT, CAD, HLD  History of Present Illness:    [name] is a 87 y.o. male with a history of hypertension, aortic stenosis status post AVR, chronic combined systolic and diastolic CHF in the setting of A. fib with RVR, orthostatic hypotension, dilated cardiomyopathy EF 25-30%, status post CABG.  He does have paroxysmal atrial fibrillation and has been cardioverted in the past.  He had CRT-D upgrade on 04/22/15.  He is on mexiletine and Sotolol for VT.    He saw Dr. Curt Bears 01/2020 and was doing well.  His Medtronic CRT-D was working well and pacing rate was increased to 75bpm to suppress PVCs.  His diuretics had been increased due to an elevated Optivol and that returned to normal.  He had a DCCV in July 2021 and has maintained NSR since then.    He is here today for followup and is doing well.  He denies any chest pain or pressure, SOB, DOE (except walking long distances), PND, orthopnea, LE edema, dizziness, palpitations or syncope. He is compliant with his meds and is tolerating meds with no SE.     Prior CV studies:   The following studies were reviewed today:  EKG  Past Medical History:  Diagnosis Date   Arthritis    "minor everywhere" (04/22/2015)   Asthma    "a touch"   Barrett esophagus    CAD (coronary artery disease)    a. s/p 3 vvessel CABG with LIMA to LAD, SVG to diagonal 1, SVG to RCA 12/09.   Cardiac resynchronization therapy defibrillator (CRT-D) in place    Chronic anticoagulation 01/23/2013   Chronic combined systolic and diastolic CHF (congestive heart failure) (HCC)    dry weight 197-200lbs.   CKD (chronic kidney disease) stage 3, GFR 30-59 ml/min (HCC) 05/10/69   Complication of anesthesia    DCM (dilated cardiomyopathy) ([place])    initially concerned  for TTP amyloidosis but Tc-PYP scan 06/2017 was not consistent with amyloid and SPEP/UPEP with no M spike. EF 20-25% by echo 2019   Diverticulitis 08/2019   Dyslipidemia 01/23/2013   Essential hypertension 01/23/2013   GERD (gastroesophageal reflux disease)    GI bleed 05/08/2017   H/O: rheumatic fever    Hepatitis 1957   "don't know what kind:   History of hiatal hernia    History of PFTs    a. Amiodarone started 5/16 >> PFTs w/ DLCO 6/16:  FEV1 72% predicted, FEV1/FVC 66%, DLCO 66% >> minimal reversible obstructive airways disease with mild diffusion defect (suggestive of emphysema but absence of hyperinflation inconsistent with dx)   Hyperkalemia 08/23/2014   Hyperthyroidism 05/26/2017   Leg weakness 01/28/2017   Multiple thyroid nodules 08/13/2017   Orthostatic hypotension    Persistent atrial fibrillation ([place]) 01/23/2013   Pleural effusion on right 05/26/2017   Pneumonia 08/2014   "dr thought I may have had a touch"   Pre-diabetes    Protein-calorie malnutrition, severe 05/27/2017   S/P AVR (aortic valve replacement) 2009   a. severe AS s/p AVR with pericardial tissue valve 2009.   Shingles 08/23/2014   VT (ventricular tachycardia) 05/06/2017   Past Surgical History:  Procedure Laterality Date   AORTIC VALVE REPLACEMENT  with 23-mm Magna Ease pericardial valve, model number   with 23-mm  Magna Ease pericardial valve, model [id number], serial number [id number]    APPENDECTOMY  1940s   BI-VENTRICULAR IMPLANTABLE CARDIOVERTER DEFIBRILLATOR  (CRT-D)  04/22/2015   CARDIAC CATHETERIZATION N/A 11/04/2014   Procedure: Left Heart Cath and Cors/Grafts Angiography;  Surgeon: Leonie Man, MD;  Location: [employer] [facility] CV [facility];  Service: Cardiovascular;  Laterality: N/A;   CARDIAC VALVE REPLACEMENT     CARDIOVERSION N/A 01/27/2013   Procedure: CARDIOVERSION;  Surgeon: Sueanne Margarita, MD;  Location: [place];  Service: Cardiovascular;  Laterality: N/A;   CARDIOVERSION N/A 08/25/2014   Procedure:  CARDIOVERSION;  Surgeon: Sanda Klein, MD;  Location: [place];  Service: Cardiovascular;  Laterality: N/A;   CARDIOVERSION N/A 05/28/2018   Procedure: CARDIOVERSION;  Surgeon: Pixie Casino, MD;  Location: [facility] ENDOSCOPY;  Service: Cardiovascular;  Laterality: N/A;   CARDIOVERSION N/A 10/28/2019   Procedure: CARDIOVERSION;  Surgeon: Sueanne Margarita, MD;  Location: [facility] ENDOSCOPY;  Service: Cardiovascular;  Laterality: N/A;   CARDIOVERSION N/A 09/01/2020   Procedure: CARDIOVERSION;  Surgeon: Lelon Perla, MD;  Location: [facility] ENDOSCOPY;  Service: Cardiovascular;  Laterality: N/A;   CATARACT EXTRACTION W/ INTRAOCULAR LENS  IMPLANT, BILATERAL Bilateral    CHEST TUBE INSERTION Right 07/26/2017   Procedure: INSERTION PLEURAL DRAINAGE CATHETER - RIGHT;  Surgeon: Ivin Poot, MD;  Location: [place];  Service: Thoracic;  Laterality: Right;   CORONARY ARTERY BYPASS GRAFT  03/10/2008   x 3 Dr. Roxan Hockey   EP IMPLANTABLE DEVICE N/A 04/22/2015   Procedure: BiV ICD Insertion CRT-D;  Surgeon: [name], MD;  Location: [place] CV LAB;  Service: Cardiovascular;  Laterality: N/A;   ESOPHAGOGASTRODUODENOSCOPY (EGD) WITH ESOPHAGEAL DILATION  X1   ESOPHAGOGASTRODUODENOSCOPY (EGD) WITH PROPOFOL N/A 05/09/2017   Procedure: ESOPHAGOGASTRODUODENOSCOPY (EGD) WITH PROPOFOL;  Surgeon: Carol Ada, MD;  Location: [place];  Service: Endoscopy;  Laterality: N/A;   IR THORACENTESIS ASP PLEURAL SPACE W/IMG GUIDE  05/27/2017   IR THORACENTESIS ASP PLEURAL SPACE W/IMG GUIDE  06/17/2017   IR THORACENTESIS ASP PLEURAL SPACE W/IMG GUIDE  06/18/2017   IR THORACENTESIS ASP PLEURAL SPACE W/IMG GUIDE  07/11/2017   REMOVAL OF PLEURAL DRAINAGE CATHETER Right 09/13/2017   Procedure: REMOVAL OF PLEURAL DRAINAGE CATHETER;  Surgeon: Ivin Poot, MD;  Location: [place];  Service: Thoracic;  Laterality: Right;   RIGHT HEART CATH AND CORONARY/GRAFT ANGIOGRAPHY N/A 05/31/2017   Procedure: RIGHT HEART CATH AND  CORONARY/GRAFT ANGIOGRAPHY;  Surgeon: Jolaine Artist, MD;  Location: [place] CV LAB;  Service: Cardiovascular;  Laterality: N/A;   TONSILLECTOMY       Current Meds  Medication Sig   acetaminophen (TYLENOL) 500 MG tablet Take 1,000 mg by mouth every 6 (six) hours as needed for moderate pain or headache.   albuterol (VENTOLIN HFA) 108 (90 Base) MCG/ACT inhaler Inhale 1-2 puffs into the lungs every 6 (six) hours as needed for wheezing or shortness of breath.   apixaban (ELIQUIS) 2.5 MG TABS tablet Take 1 tablet (2.5 mg total) by mouth 2 (two) times daily.   carvedilol (COREG) 25 MG tablet Take 1 tablet (25 mg total) by mouth 2 (two) times daily with a meal. TAKE 1 TABLET TWICE DAILY (DOSE INCREASE)   famotidine (PEPCID) 40 MG tablet Take 40 mg by mouth every evening.    furosemide (LASIX) 40 MG tablet TAKE 1 TABLET (40 MG TOTAL) BY MOUTH DAILY AS NEEDED FOR EDEMA.   hydrALAZINE (APRESOLINE) 25 MG tablet Take 0.5 tablets (12.5 mg total) by mouth in the  morning and at bedtime.   isosorbide mononitrate (IMDUR) 30 MG 24 hr tablet Take 1 tablet (30 mg total) by mouth daily.   mexiletine (MEXITIL) 150 MG capsule TAKE 2 CAPSULES BY MOUTH EVERY 12 HOURS.   Multiple Vitamin (MULTIVITAMIN WITH MINERALS) TABS tablet Take 1 tablet by mouth daily.    Omega-3 Fatty Acids (FISH OIL) 1000 MG CAPS Take 2,000 mg by mouth 2 (two) times daily.   pantoprazole (PROTONIX) 40 MG tablet Take 40 mg by mouth daily.   rosuvastatin (CRESTOR) 20 MG tablet Take 20 mg by mouth daily.   sotalol (BETAPACE) 80 MG tablet TAKE 1 TABLET TWICE DAILY     Allergies:   Ranolazine, Novocain [procaine], and Amiodarone   Social History   Tobacco Use   Smoking status: Never   Smokeless tobacco: Never  Vaping Use   Vaping Use: Never used  Substance Use Topics   Alcohol use: Not Currently    Comment: 04/22/2015 "nothing since the 1980s; never had a problem w/it"   Drug use: No     Family Hx: The patient's family history  includes Alzheimer's disease in his mother and another family member; COPD in his daughter and sister; Depression in his sister; Esophageal cancer in his sister; Heart disease in his sister; Hyperlipidemia in his sister; Hypertension in his sister.  ROS:   Please see the history of present illness.     All other systems reviewed and are negative.   Labs/Other Tests and Data Reviewed:    2D echo 08/2019 IMPRESSIONS    1. Left ventricular ejection fraction, by estimation, is 30 to 35%. The  left ventricle has moderately decreased function. Left ventricular  endocardial border not optimally defined to evaluate regional wall motion.  Left ventricular diastolic parameters  are consistent with Grade II diastolic dysfunction (pseudonormalization).  Elevated left ventricular end-diastolic pressure.   2. Right ventricular systolic function is moderately reduced. The right  ventricular size is normal. Tricuspid regurgitation signal is inadequate  for assessing PA pressure.   3. Left atrial size was mildly dilated.   4. The mitral valve is abnormal. Trivial mitral valve regurgitation. No  evidence of mitral stenosis.   5. The aortic valve has been repaired/replaced. Aortic valve  regurgitation is not visualized. There is a 23 mm Magna Ease pericardial  valve present in the aortic position. Procedure Date: 03/10/2008. Echo  findings are consistent with normal structure and   function of the aortic valve prosthesis. Aortic valve mean gradient  measures 6.0 mmHg.   Recent Labs: 09/01/2020: BUN 44; Creatinine, Ser 2.40; Hemoglobin 14.3; Potassium 5.7; Sodium 137   Recent Lipid Panel Lab Results  Component Value Date/Time   CHOL 126 07/30/2016 11:43 AM   CHOL 93 (L) 06/16/2014 09:14 AM   TRIG 154 (H) 07/30/2016 11:43 AM   TRIG 124 06/16/2014 09:14 AM   HDL 45 07/30/2016 11:43 AM   HDL 34 (L) 06/16/2014 09:14 AM   CHOLHDL 2.8 07/30/2016 11:43 AM   CHOLHDL 2.7 01/19/2015 10:05 AM   LDLCALC  50 07/30/2016 11:43 AM   LDLCALC 34 06/16/2014 09:14 AM    Wt Readings from Last 3 Encounters:  02/07/21 204 lb 6.4 oz (92.7 kg)  12/21/20 201 lb (91.2 kg)  09/01/20 194 lb (88 kg)     Objective:    Vital Signs:  BP 128/76   Pulse 80   Ht 6' (1.829 m)   Wt 204 lb 6.4 oz (92.7 kg)   SpO2 96%  BMI 27.72 kg/m   GEN: Well nourished, well developed in no acute distress HEENT: Normal NECK: No JVD; No carotid bruits LYMPHATICS: No lymphadenopathy CARDIAC:RRR, no murmurs, rubs, gallops RESPIRATORY:  Clear to auscultation without rales, wheezing or rhonchi  ABDOMEN: Soft, non-tender, non-distended MUSCULOSKELETAL:  No edema; No deformity  SKIN: Warm and dry NEUROLOGIC:  Alert and oriented x 3 PSYCHIATRIC:  Normal affect     EKG was performed in the office today and showed AV paced rhythm ASSESSMENT & PLAN:    1. Recurrent R Pleural effusion - Had Pleurex placed 4/19 after multiple thoracentesis - Effusions resolved and Pluerex removed 6/19 - No evidence of recurrence    2. Chronic systolic HF due to ICM s/p CRT-D - Echo 09/07/2019 showed EF 30-35% - Remains NYHA class 2b - his weight is up 4-5 lbs but he thinks it is from not gardening, more sedentary this fall and eating more - he denies any SOB or edema - he appears euvolemic on exam - Continue prescription drug management with Lasix 1m daily and PRN extra for weight gain, Carvedilol 269mBID, Hydralazine 12.7m76mID and Imdur 7m34mily with PRN refills - Not on ACE/ARB/ARNI/MRA due to CKD    3. CAD s/p CABG/AVR 2009 - Cath 05/2017 showed three-vessel CAD with patent LIMA to the LAD, SVG to diagonal and SVG to the RCA. - he denies any anginal sx - continue prescription drug management with BB, long acting nitrates and statin with PRN refills - No ASA due to DOAC   4. VT - S/p ICD with CRT-D upgrade in 2017 - followed in device clinic and with Dr. CamnCurt Bears found to have recurrent VT on recent device check -  continue prescription drug management Mexilitine 150mg52m and Sotolol 80mg 34mwith PRN refills    5. Paroxysmal AF - s/p DCCV in 2/20 - he is maintaining NSR on exam today - Continue prescription drug management with Eliquis 2.7mg BI18mosed for age>80 and creatinine>1.5) and Carvedilol 27mg BI22mth PRN refills.  - denies any bleeding on DOAC - I have personally reviewed and interpreted outside labs performed by patient's PCP which showed SCr 2.48 and Hbg 14.3 in May and July 2022   6.  Severe AS -s/p  23mm Mag73mase pericardial tissue valve in 2009 -echo 08/2019 showed stable AVR with mean AVG 6mmHg   711mKD 3-4 - [place] with [place].  HLD - LDL goal is < 70 - check FLP and ALT - continue prescription drug management with atorvastatin 20mg daily47mh PRN refills  Tests Ordered: Orders Placed This Encounter  Procedures   EKG 12-Lead    Medication Changes: No orders of the defined types were placed in this encounter.   Disposition:  Follow up in 1 year(s)  Signed, Ramie Erman TurneFransico Him2022 1:14 PM    [facility]

## 2021-02-07 NOTE — Patient Instructions (Signed)
Medication Instructions:  Your physician recommends that you continue on your current medications as directed. Please refer to the Current Medication list given to you today.  *If you need a refill on your cardiac medications before your next appointment, please call your pharmacy*   Lab Work: TODAY FLP AND CMET If you have labs (blood work) drawn today and your tests are completely normal, you will receive your results only by: [place] (if you have MyChart) OR A paper copy in the mail If you have any lab test that is abnormal or we need to change your treatment, we will call you to review the results.   Follow-Up: At [facility], you and your health needs are our priority.  As part of our continuing mission to provide you with exceptional heart care, we have created designated Provider Care Teams.  These Care Teams include your primary Cardiologist (physician) and Advanced Practice Providers (APPs -  Physician Assistants and Nurse Practitioners) who all work together to provide you with the care you need, when you need it.  We recommend signing up for the patient portal called "MyChart".  Sign up information is provided on this After Visit Summary.  MyChart is used to connect with patients for Virtual Visits (Telemedicine).  Patients are able to view lab/test results, encounter notes, upcoming appointments, etc.  Non-urgent messages can be sent to your provider as well.   To learn more about what you can do with MyChart, go to [email or web address].    Your next appointment:   6 month(s)  The format for your next appointment:   In Person  Provider:   You may see [name], MD or one of the following Advanced Practice Providers on your designated Care Team:   Melina Copa, PA-C Ermalinda Barrios, PA-C

## 2021-02-08 ENCOUNTER — Telehealth: Payer: Self-pay

## 2021-02-08 DIAGNOSIS — E785 Hyperlipidemia, unspecified: Secondary | ICD-10-CM

## 2021-02-08 LAB — COMPREHENSIVE METABOLIC PANEL
ALT: 18 IU/L (ref 0–44)
AST: 19 IU/L (ref 0–40)
Albumin/Globulin Ratio: 2 (ref 1.2–2.2)
Albumin: 4.6 g/dL (ref 3.6–4.6)
Alkaline Phosphatase: 55 IU/L (ref 44–121)
BUN/Creatinine Ratio: 12 (ref 10–24)
BUN: 27 mg/dL (ref 8–27)
Bilirubin Total: 0.6 mg/dL (ref 0.0–1.2)
CO2: 23 mmol/L (ref 20–29)
Calcium: 9.2 mg/dL (ref 8.6–10.2)
Chloride: 101 mmol/L (ref 96–106)
Creatinine, Ser: 2.32 mg/dL — ABNORMAL HIGH (ref 0.76–1.27)
Globulin, Total: 2.3 g/dL (ref 1.5–4.5)
Glucose: 132 mg/dL — ABNORMAL HIGH (ref 70–99)
Potassium: 4.9 mmol/L (ref 3.5–5.2)
Sodium: 139 mmol/L (ref 134–144)
Total Protein: 6.9 g/dL (ref 6.0–8.5)
eGFR: 27 mL/min/{1.73_m2} — ABNORMAL LOW (ref >59)

## 2021-02-08 LAB — LIPID PANEL
Chol/HDL Ratio: 3.3 ratio (ref 0.0–5.0)
Cholesterol, Total: 129 mg/dL (ref 100–199)
HDL: 39 mg/dL — ABNORMAL LOW (ref >39)
LDL Chol Calc (NIH): 51 mg/dL (ref 0–99)
Triglycerides: 244 mg/dL — ABNORMAL HIGH (ref 0–149)
VLDL Cholesterol Cal: 39 mg/dL (ref 5–40)

## 2021-02-08 NOTE — Telephone Encounter (Signed)
-----  Message from Sueanne Margarita, MD sent at 02/08/2021  8:57 AM EDT ----- Stable renal function but triglycerides are not at goal.  Please refer to lipid clinic.  Please forward a copy of these labs to PCP to follow-up on elevated creatinine

## 2021-02-08 NOTE — Telephone Encounter (Signed)
The patient has been notified of the result and verbalized understanding.  All questions (if any) were answered. Antonieta Iba, RN 02/08/2021 3:28 PM  Referral has been placed/.

## 2021-02-14 ENCOUNTER — Telehealth: Payer: Self-pay

## 2021-02-14 ENCOUNTER — Ambulatory Visit (INDEPENDENT_AMBULATORY_CARE_PROVIDER_SITE_OTHER): Payer: Medicare HMO

## 2021-02-14 DIAGNOSIS — I472 Ventricular tachycardia, unspecified: Secondary | ICD-10-CM

## 2021-02-14 DIAGNOSIS — I255 Ischemic cardiomyopathy: Secondary | ICD-10-CM | POA: Diagnosis not present

## 2021-02-14 LAB — CUP PACEART REMOTE DEVICE CHECK
Battery Remaining Longevity: 18 mo
Battery Voltage: 2.9 V
Brady Statistic AP VP Percent: 99.4 %
Brady Statistic AP VS Percent: 0.39 %
Brady Statistic AS VP Percent: 0.21 %
Brady Statistic AS VS Percent: 0 %
Brady Statistic RA Percent Paced: 99.52 %
Brady Statistic RV Percent Paced: 99.45 %
Date Time Interrogation Session: 20221108033522
HighPow Impedance: 65 Ohm
Implantable Lead Implant Date: 20170113
Implantable Lead Implant Date: 20170113
Implantable Lead Implant Date: 20170113
Implantable Lead Location: 753858
Implantable Lead Location: 753859
Implantable Lead Location: 753860
Implantable Lead Model: 4298
Implantable Lead Model: 5076
Implantable Pulse Generator Implant Date: 20170113
Lead Channel Impedance Value: 1026 Ohm
Lead Channel Impedance Value: 1102 Ohm
Lead Channel Impedance Value: 399 Ohm
Lead Channel Impedance Value: 437 Ohm
Lead Channel Impedance Value: 437 Ohm
Lead Channel Impedance Value: 494 Ohm
Lead Channel Impedance Value: 494 Ohm
Lead Channel Impedance Value: 570 Ohm
Lead Channel Impedance Value: 589 Ohm
Lead Channel Impedance Value: 627 Ohm
Lead Channel Impedance Value: 893 Ohm
Lead Channel Impedance Value: 950 Ohm
Lead Channel Impedance Value: 969 Ohm
Lead Channel Pacing Threshold Amplitude: 0.375 V
Lead Channel Pacing Threshold Amplitude: 0.75 V
Lead Channel Pacing Threshold Amplitude: 0.875 V
Lead Channel Pacing Threshold Pulse Width: 0.4 ms
Lead Channel Pacing Threshold Pulse Width: 0.4 ms
Lead Channel Pacing Threshold Pulse Width: 0.4 ms
Lead Channel Sensing Intrinsic Amplitude: 1 mV
Lead Channel Sensing Intrinsic Amplitude: 1 mV
Lead Channel Sensing Intrinsic Amplitude: 8.5 mV
Lead Channel Sensing Intrinsic Amplitude: 8.5 mV
Lead Channel Setting Pacing Amplitude: 1.5 V
Lead Channel Setting Pacing Amplitude: 2 V
Lead Channel Setting Pacing Amplitude: 2 V
Lead Channel Setting Pacing Pulse Width: 0.4 ms
Lead Channel Setting Pacing Pulse Width: 0.4 ms
Lead Channel Setting Sensing Sensitivity: 0.3 mV

## 2021-02-14 NOTE — Telephone Encounter (Signed)
"  Scheduled remote reviewed. Normal device function.   2 episodes of VT terminated with ATP x1. Initial episode 01/28/21. Most recent episode on 01/31/21 had multiple logged NSVT episodes prior to therapy to terminate from [phone], most likely all 1 episode with rates at detection ranging 130-140's (VT detection >140bpm (see EGM's). Routing for further review of therapies."  Successful telephone encounter to patient to follow up on possible s/s of NSVT with successful ATP x 1. Patient provided date and time of both episodes 10/25 and 10/22. He was unaware of rapid HR and therapy. [place] driving restrictions and shock plan reviewed. Confirmed patient is compliant with medications as dosages on EMR including sotalol, mexiletine, coreg, lasix, and eliquis. Will route to Dr. Curt Bears for review and recommendations. Patient understands he will be updated if treatment plan should change per Dr. Curt Bears. Patient appreciate of call and follow up.

## 2021-02-16 ENCOUNTER — Other Ambulatory Visit: Payer: Self-pay

## 2021-02-16 NOTE — Addendum Note (Signed)
Addended by: Trena Platt E on: 02/16/2021 04:52 PM   Modules accepted: Orders

## 2021-02-16 NOTE — Telephone Encounter (Signed)
Spoke with pt, advised of MD recommendation for labwork and OV with EP APP.  Pt states he is unable to get a ride to the office and that he  just had labwork done on 02/07/21.    Advised a scheduler will be calling hm to set up appt with EP APP.

## 2021-02-20 NOTE — Progress Notes (Signed)
Patient ID: [name]                 DOB: 1932-08-24                    MRN: [id number]     HPI: [name] is a 87 y.o. male patient referred to lipid clinic by Dr. Radford Pax for triglyceride management. PMH is significant for HTN, aortic stenosis status post AVR, chronic combined systolic and diastolic CHF in the setting of afib with RVR (recent cardioversion 09/01/20) dilated cardiomyopathy EF 25-30%, CABG in 2009, DCCV in July 2021, and orthostatic hypotension. Seen by lipid clinic 05/2020 for triglyceride management but patient wanted to focus on lifestyle modifications and cost of Vascepa was $45/month.   Today, patient arrives in good spirits. States his daughter helps him pick up and manage his medications because he sometimes gets confused and also has trouble with transportation. He is taking atorvastatin, not rosuvastatin, so this has been updated on his medication list. Tolerating atorvastatin well. He takes a fish oil that he gets for free through [facility] mail order. He has some lab work ordered today by Dr. Curt Bears which he will do at the end of the visit.   Current Medications: atorvastatin 20 mg daily, fish oil 2 g BID Intolerances: none  Risk Factors: HTN, CHF, s/p CABG LDL goal: <70 mg/dL Triglyceride goal: <150 mg/dL  Diet: Has been working on improving diet, eats food from his own garden, says he doesn't eat as well in the winter when he doesn't have as much fresh produce  Exercise: ~1 hour daily using stationary mini exercise bike; gardens  Family History: Alzheimer's disease in his mother and another family member; COPD in his daughter and sister; Depression in his sister; Esophageal cancer in his sister; Heart disease in his sister; Hyperlipidemia in his sister; Hypertension in his sister.  Social History: denies tobacco use; denies alcohol use  Labs: 02/07/21: LDL 51, TG 244, HDL 39, TC 129 05/06/20: LDL 51, TG 219, HDL 39, TC 125, nonHDL 86 (atorvastatin 20 mg  daily) 07/30/16: LDL 50, TG 154, TC 126, HDL 45 (atorvastatin 20 mg daily)  Past Medical History:  Diagnosis Date   Arthritis    "minor everywhere" (04/22/2015)   Asthma    "a touch"   Barrett esophagus    CAD (coronary artery disease)    a. s/p 3 vvessel CABG with LIMA to LAD, SVG to diagonal 1, SVG to RCA 12/09.   Cardiac resynchronization therapy defibrillator (CRT-D) in place    Chronic anticoagulation 01/23/2013   Chronic combined systolic and diastolic CHF (congestive heart failure) (HCC)    dry weight 197-200lbs.   CKD (chronic kidney disease) stage 3, GFR 30-59 ml/min (HCC) 5/92/9244   Complication of anesthesia    DCM (dilated cardiomyopathy) ([place])    initially concerned for TTP amyloidosis but Tc-PYP scan 06/2017 was not consistent with amyloid and SPEP/UPEP with no M spike. EF 20-25% by echo 2019   Diverticulitis 08/2019   Dyslipidemia 01/23/2013   Essential hypertension 01/23/2013   GERD (gastroesophageal reflux disease)    GI bleed 05/08/2017   H/O: rheumatic fever    Hepatitis 1957   "don't know what kind:   History of hiatal hernia    History of PFTs    a. Amiodarone started 5/16 >> PFTs w/ DLCO 6/16:  FEV1 72% predicted, FEV1/FVC 66%, DLCO 66% >> minimal reversible obstructive airways disease with mild diffusion defect (suggestive of emphysema  but absence of hyperinflation inconsistent with dx)   Hyperkalemia 08/23/2014   Hyperthyroidism 05/26/2017   Leg weakness 01/28/2017   Multiple thyroid nodules 08/13/2017   Orthostatic hypotension    Persistent atrial fibrillation ([place]) 01/23/2013   Pleural effusion on right 05/26/2017   Pneumonia 08/2014   "dr thought I may have had a touch"   Pre-diabetes    Protein-calorie malnutrition, severe 05/27/2017   S/P AVR (aortic valve replacement) 2009   a. severe AS s/p AVR with pericardial tissue valve 2009.   Shingles 08/23/2014   VT (ventricular tachycardia) 05/06/2017    Current Outpatient Medications on File Prior to Visit   Medication Sig Dispense Refill   acetaminophen (TYLENOL) 500 MG tablet Take 1,000 mg by mouth every 6 (six) hours as needed for moderate pain or headache.     albuterol (VENTOLIN HFA) 108 (90 Base) MCG/ACT inhaler Inhale 1-2 puffs into the lungs every 6 (six) hours as needed for wheezing or shortness of breath.     apixaban (ELIQUIS) 2.5 MG TABS tablet Take 1 tablet (2.5 mg total) by mouth 2 (two) times daily. 180 tablet 1   carvedilol (COREG) 25 MG tablet Take 1 tablet (25 mg total) by mouth 2 (two) times daily with a meal. TAKE 1 TABLET TWICE DAILY (DOSE INCREASE) 180 tablet 3   famotidine (PEPCID) 40 MG tablet Take 40 mg by mouth every evening.      furosemide (LASIX) 40 MG tablet TAKE 1 TABLET (40 MG TOTAL) BY MOUTH DAILY AS NEEDED FOR EDEMA. 90 tablet 0   hydrALAZINE (APRESOLINE) 25 MG tablet Take 0.5 tablets (12.5 mg total) by mouth in the morning and at bedtime. 90 tablet 1   isosorbide mononitrate (IMDUR) 30 MG 24 hr tablet Take 1 tablet (30 mg total) by mouth daily. 90 tablet 3   mexiletine (MEXITIL) 150 MG capsule TAKE 2 CAPSULES BY MOUTH EVERY 12 HOURS. 180 capsule 3   Multiple Vitamin (MULTIVITAMIN WITH MINERALS) TABS tablet Take 1 tablet by mouth daily.      Omega-3 Fatty Acids (FISH OIL) 1000 MG CAPS Take 2,000 mg by mouth 2 (two) times daily.     pantoprazole (PROTONIX) 40 MG tablet Take 40 mg by mouth daily.     rosuvastatin (CRESTOR) 20 MG tablet Take 20 mg by mouth daily.     sotalol (BETAPACE) 80 MG tablet TAKE 1 TABLET TWICE DAILY 180 tablet 2   No current facility-administered medications on file prior to visit.    Allergies  Allergen Reactions   Ranolazine Other (See Comments)    States he went into a coma and was in hospital for 3 days after taking   Novocain [Procaine] Swelling   Amiodarone Nausea And Vomiting    Assessment/Plan:  1. Hyperlipidemia - LDL is at goal <70 mg/dL however TG remains elevated above goal <150 mg/dL. Will continue atorvastatin 20 mg  daily and switch his over the counter fish oil to Vascepa 2g BID for additional TG lowering and cardiac benefit. I applied him for the [employer] which he is approved for which will bring the cost of Vascepa to $0. I gave him the information he needs to bring to the pharmacy for them to run. Vascepa does not require a PA on his insurance so it has already been sent [facility] local [facility]. He sees his PCP in January and he reports his PCP will check a lipid panel there. We will follow up to ensure this is checked to  monitor his TG.    Rebbeca Paul, PharmD PGY2 Ambulatory Care Pharmacy Resident 02/21/2021 4:27 PM

## 2021-02-21 ENCOUNTER — Ambulatory Visit: Payer: Medicare HMO | Admitting: Student-PharmD

## 2021-02-21 ENCOUNTER — Other Ambulatory Visit: Payer: Self-pay

## 2021-02-21 DIAGNOSIS — I472 Ventricular tachycardia, unspecified: Secondary | ICD-10-CM | POA: Diagnosis not present

## 2021-02-21 DIAGNOSIS — E785 Hyperlipidemia, unspecified: Secondary | ICD-10-CM

## 2021-02-21 MED ORDER — VASCEPA 1 G PO CAPS: 2.0000 g | ORAL_CAPSULE | Freq: Two times a day (BID) | ORAL | 3 refills | Status: DC

## 2021-02-21 NOTE — Patient Instructions (Addendum)
Nice to see you today!  Keep up the good work with diet and exercise. Aim for a diet full of vegetables, fruit and lean meats (chicken, [place], fish). Try to limit carbs (bread, pasta, sugar, rice) and red meat consumption.  Your goal triglycerides is less than 150 mg/dL, you're currently at 224 mg/dL  Medication Changes: STOP your current fish oil. START Vascepa 2 grams (2 capsules) twice daily. Show your pharmacy the printout I gave you to bring the cost of this to $0. It is a [employer], not a copay card and they need to run it as secondary insurance after your Medicare Part D.   Continue atorvastatin 20 mg daily.   Please have a fasting lipid panel checked at your doctor's appointment in January so we can check your triglycerides on this medicine and have it faxed to [place]: [phone].   Please give [place] a call at [phone] with any questions or concerns.

## 2021-02-22 LAB — BASIC METABOLIC PANEL
BUN/Creatinine Ratio: 16 (ref 10–24)
BUN: 34 mg/dL — ABNORMAL HIGH (ref 8–27)
CO2: 19 mmol/L — ABNORMAL LOW (ref 20–29)
Calcium: 9.2 mg/dL (ref 8.6–10.2)
Chloride: 104 mmol/L (ref 96–106)
Creatinine, Ser: 2.11 mg/dL — ABNORMAL HIGH (ref 0.76–1.27)
Glucose: 113 mg/dL — ABNORMAL HIGH (ref 70–99)
Potassium: 5.2 mmol/L (ref 3.5–5.2)
Sodium: 140 mmol/L (ref 134–144)
eGFR: 30 mL/min/{1.73_m2} — ABNORMAL LOW (ref >59)

## 2021-02-22 LAB — MAGNESIUM: Magnesium: 2.4 mg/dL — ABNORMAL HIGH (ref 1.6–2.3)

## 2021-02-22 NOTE — Progress Notes (Signed)
Remote ICD transmission.   

## 2021-02-24 ENCOUNTER — Other Ambulatory Visit: Payer: Self-pay

## 2021-02-24 ENCOUNTER — Encounter: Payer: Self-pay | Admitting: Physician Assistant

## 2021-02-24 ENCOUNTER — Ambulatory Visit: Payer: Medicare HMO | Admitting: Physician Assistant

## 2021-02-24 VITALS — BP 120/68 | HR 82 | Resp 18 | Ht 72.0 in | Wt 206.0 lb

## 2021-02-24 DIAGNOSIS — Z952 Presence of prosthetic heart valve: Secondary | ICD-10-CM

## 2021-02-24 DIAGNOSIS — I480 Paroxysmal atrial fibrillation: Secondary | ICD-10-CM

## 2021-02-24 DIAGNOSIS — Z8679 Personal history of other diseases of the circulatory system: Secondary | ICD-10-CM

## 2021-02-24 DIAGNOSIS — I2510 Atherosclerotic heart disease of native coronary artery without angina pectoris: Secondary | ICD-10-CM | POA: Diagnosis not present

## 2021-02-24 DIAGNOSIS — I5042 Chronic combined systolic (congestive) and diastolic (congestive) heart failure: Secondary | ICD-10-CM | POA: Diagnosis not present

## 2021-02-24 DIAGNOSIS — Z95810 Presence of automatic (implantable) cardiac defibrillator: Secondary | ICD-10-CM | POA: Diagnosis not present

## 2021-02-24 DIAGNOSIS — I255 Ischemic cardiomyopathy: Secondary | ICD-10-CM

## 2021-02-24 DIAGNOSIS — I38 Endocarditis, valve unspecified: Secondary | ICD-10-CM

## 2021-02-24 DIAGNOSIS — I472 Ventricular tachycardia, unspecified: Secondary | ICD-10-CM

## 2021-02-24 DIAGNOSIS — I4892 Unspecified atrial flutter: Secondary | ICD-10-CM | POA: Diagnosis not present

## 2021-02-24 LAB — CUP PACEART INCLINIC DEVICE CHECK
Battery Remaining Longevity: 17 mo
Battery Voltage: 2.89 V
Brady Statistic AP VP Percent: 99.45 %
Brady Statistic AP VS Percent: 0.35 %
Brady Statistic AS VP Percent: 0.2 %
Brady Statistic AS VS Percent: 0 %
Brady Statistic RA Percent Paced: 99.55 %
Brady Statistic RV Percent Paced: 99.5 %
Date Time Interrogation Session: 20221118185932
HighPow Impedance: 78 Ohm
Implantable Lead Implant Date: 20170113
Implantable Lead Implant Date: 20170113
Implantable Lead Implant Date: 20170113
Implantable Lead Location: 753858
Implantable Lead Location: 753859
Implantable Lead Location: 753860
Implantable Lead Model: 4298
Implantable Lead Model: 5076
Implantable Pulse Generator Implant Date: 20170113
Lead Channel Impedance Value: 1045 Ohm
Lead Channel Impedance Value: 399 Ohm
Lead Channel Impedance Value: 437 Ohm
Lead Channel Impedance Value: 456 Ohm
Lead Channel Impedance Value: 456 Ohm
Lead Channel Impedance Value: 494 Ohm
Lead Channel Impedance Value: 513 Ohm
Lead Channel Impedance Value: 589 Ohm
Lead Channel Impedance Value: 627 Ohm
Lead Channel Impedance Value: 817 Ohm
Lead Channel Impedance Value: 893 Ohm
Lead Channel Impedance Value: 950 Ohm
Lead Channel Impedance Value: 988 Ohm
Lead Channel Pacing Threshold Amplitude: 0.375 V
Lead Channel Pacing Threshold Amplitude: 0.75 V
Lead Channel Pacing Threshold Amplitude: 0.875 V
Lead Channel Pacing Threshold Pulse Width: 0.4 ms
Lead Channel Pacing Threshold Pulse Width: 0.4 ms
Lead Channel Pacing Threshold Pulse Width: 0.4 ms
Lead Channel Sensing Intrinsic Amplitude: 0.75 mV
Lead Channel Sensing Intrinsic Amplitude: 1.125 mV
Lead Channel Sensing Intrinsic Amplitude: 10.875 mV
Lead Channel Sensing Intrinsic Amplitude: 8.75 mV
Lead Channel Setting Pacing Amplitude: 1.5 V
Lead Channel Setting Pacing Amplitude: 2 V
Lead Channel Setting Pacing Amplitude: 2 V
Lead Channel Setting Pacing Pulse Width: 0.4 ms
Lead Channel Setting Pacing Pulse Width: 0.4 ms
Lead Channel Setting Sensing Sensitivity: 0.3 mV

## 2021-02-24 NOTE — Progress Notes (Signed)
Cardiology Office Note Date:  02/24/2021  Patient ID:  [name], [name] Jul 11, 1932, MRN [id number] PCP:  [name], MD  Cardiologist:  Dr. Radford Pax AHF: Dr. Haroldine Laws Electrophysiologist: Dr. Curt Bears    Chief Complaint:  VT   History of Present Illness: [name] is a 87 y.o. male with history of CAD/VHD s/p CABG/bioprosthetic AVR in 2009, chronic CHF (systolic), ICM, PAF, orthostatic hypotension, VT, ICD.   March 2019 required right thoras (x2), April another 2L drained Admitted 3/9 -> 06/20/17 with recurrent pleural effusion and markedly elevated BNP. Underwent Thoracentesis x 2. Had NSVT up to 40 beats, electrolytes replaced aggressively.  PYP scan 06/20/17 NOT suggestive of TTR amyloid)(and eventually placement of Pleurex catheter (Dr. Nils Pyle) and removed June, CKD (III).  He has had recurrent VT over the years with appropriate therapies both ATP and HV therapy He has also had double tachycardia   09/25/19 Seen by A. Lynnell Jude, NP 2/2 to having been found on remotes to have prolonged episode of VT under detection that eventually accelerated enuough to get HV therapy. On arrival to his visit he was in a double tachycardia and with JA supervision pace terminated the VT though unable to terminate his AFlutter His sotalol was had been increased at a recent hospital stay though for unclear reasons discharged on 70m BID His sotalol was resumed to 840mBID, his VT zone HR was reduced.   He last saw EP service by A. TiKaufmanPAAlexander/14/2021, he remained in ,AF had mot missed any OAC doses and planned for DCCV > 10/28/19 successful   He last saw EP by A. Tillery, PA-C, 12/2020, he was doing well, no changes were made.  He saw Dr. TuRadford Paxost recently 02/07/21, also reported as doing well , no changes were made  Device clinic note 02/14/21 reporting VT that occurred 01/28/21, successfully terminated by ATP, pt was asymptomatic, advised not to drive Dr. 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 He has slowed over the years but feels like he has good exertional capacity No CP, palpitations or SOB of any kind He denies any kind of dizzy spells, no near syncope or syncope He is very good about taking his medicines and very unlikely that he missed medicines  No bleeding or signs of bleeding   Device information MDT CRT-D, implanted1/13/17 + hx of numerous appropriate therapies VT   AAD Hx: Amiodarone stopped Jan 2019, 2/2 SOB,  No clear finding of amio tox >> mexiletine started More VT and sotalol was added Dec 2020   Past Medical History:  Diagnosis Date   Arthritis    "minor everywhere" (04/22/2015)   Asthma    "a touch"   Barrett esophagus    CAD (coronary artery disease)    a. s/p 3 vvessel CABG with LIMA to LAD, SVG to diagonal 1, SVG to RCA 12/09.   Cardiac resynchronization therapy defibrillator (CRT-D) in place    Chronic anticoagulation 01/23/2013   Chronic combined systolic and diastolic CHF (congestive heart failure) (HCC)    dry weight 197-200lbs.   CKD (chronic kidney disease) stage 3, GFR 30-59 ml/min (HCC) 08/10/82/5364 Complication of anesthesia    DCM ([place]ilated [place]) ([facility]   initially concerned for TTP amyloidosis but Tc-PYP scan 06/2017 was not consistent with amyloid and SPEP/UPEP with no M spike. EF 20-25% by  echo 2019   Diverticulitis 08/2019   Dyslipidemia 01/23/2013   Essential hypertension 01/23/2013   GERD (gastroesophageal reflux disease)    GI bleed 05/08/2017   H/O: rheumatic fever    Hepatitis 1957   "don't know what kind:   History of hiatal hernia    History of PFTs    a. Amiodarone started 5/16 >> PFTs w/ DLCO 6/16:  FEV1 72% predicted, FEV1/FVC 66%, DLCO 66% >> minimal reversible obstructive airways disease with mild diffusion defect (suggestive of emphysema but absence of  hyperinflation inconsistent with dx)   Hyperkalemia 08/23/2014   Hyperthyroidism 05/26/2017   Leg weakness 01/28/2017   Multiple thyroid nodules 08/13/2017   Orthostatic hypotension    Persistent atrial fibrillation ([place]) 01/23/2013   Pleural effusion on right 05/26/2017   Pneumonia 08/2014   "dr thought I may have had a touch"   Pre-diabetes    Protein-calorie malnutrition, severe 05/27/2017   S/P AVR (aortic valve replacement) 2009   a. severe AS s/p AVR with pericardial tissue valve 2009.   Shingles 08/23/2014   VT (ventricular tachycardia) 05/06/2017    Past Surgical History:  Procedure Laterality Date   AORTIC VALVE REPLACEMENT  with 23-mm Magna Ease pericardial valve, model number   with 23-mm Magna Ease pericardial valve, model [id number], serial number [id number]    APPENDECTOMY  1940s   BI-VENTRICULAR IMPLANTABLE CARDIOVERTER DEFIBRILLATOR  (CRT-D)  04/22/2015   CARDIAC CATHETERIZATION N/A 11/04/2014   Procedure: Left Heart Cath and Cors/Grafts Angiography;  Surgeon: Leonie Man, MD;  Location: [facility];  Service: Cardiovascular;  Laterality: N/A;   CARDIAC VALVE REPLACEMENT     CARDIOVERSION N/A 01/27/2013   Procedure: CARDIOVERSION;  Surgeon: Sueanne Margarita, MD;  Location: [facility];  Service: Cardiovascular;  Laterality: N/A;   CARDIOVERSION N/A 08/25/2014   Procedure: CARDIOVERSION;  Surgeon: Sanda Klein, MD;  Location: [place];  Service: Cardiovascular;  Laterality: N/A;   CARDIOVERSION N/A 05/28/2018   Procedure: CARDIOVERSION;  Surgeon: Pixie Casino, MD;  Location: [facility] ENDOSCOPY;  Service: Cardiovascular;  Laterality: N/A;   CARDIOVERSION N/A 10/28/2019   Procedure: CARDIOVERSION;  Surgeon: Sueanne Margarita, MD;  Location: [facility] ENDOSCOPY;  Service: Cardiovascular;  Laterality: N/A;   CARDIOVERSION N/A 09/01/2020   Procedure: CARDIOVERSION;  Surgeon: Lelon Perla, MD;  Location: [facility]COPY;  Service: Cardiovascular;  Laterality: N/A;   CATARACT  EXTRACTION W/ INTRAOCULAR LENS  IMPLANT, BILATERAL Bilateral    CHEST TUBE INSERTION Right 07/26/2017   Procedure: INSERTION PLEURAL DRAINAGE CATHETER - RIGHT;  Surgeon: Ivin Poot, MD;  Location: [place];  Service: Thoracic;  Laterality: Right;   CORONARY ARTERY BYPASS GRAFT  03/10/2008   x 3 Dr. Roxan Hockey   EP IMPLANTABLE DEVICE N/A 04/22/2015   Procedure: BiV ICD Insertion CRT-D;  Surgeon: [name], MD;  Location: [place] [facility];  Service: Cardiovascular;  Laterality: N/A;   ESOPHAGOGASTRODUODENOSCOPY (EGD) WITH ESOPHAGEAL DILATION  X1   ESOPHAGOGASTRODUODENOSCOPY (EGD) WITH PROPOFOL N/A 05/09/2017   Procedure: ESOPHAGOGASTRODUODENOSCOPY (EGD) WITH PROPOFOL;  Surgeon: Carol Ada, MD;  Location: [place];  Service: Endoscopy;  Laterality: N/A;   IR THORACENTESIS ASP PLEURAL SPACE W/IMG GUIDE  05/27/2017   IR THORACENTESIS ASP PLEURAL SPACE W/IMG GUIDE  06/17/2017   IR THORACENTESIS ASP PLEURAL SPACE W/IMG GUIDE  06/18/2017   IR THORACENTESIS ASP PLEURAL SPACE W/IMG GUIDE  07/11/2017   REMOVAL OF PLEURAL DRAINAGE CATHETER Right 09/13/2017   Procedure: REMOVAL OF PLEURAL DRAINAGE CATHETER;  Surgeon: Prescott Gum,  Collier Salina, MD;  Location: [place];  Service: Thoracic;  Laterality: Right;   RIGHT HEART CATH AND CORONARY/GRAFT ANGIOGRAPHY N/A 05/31/2017   Procedure: RIGHT HEART CATH AND CORONARY/GRAFT ANGIOGRAPHY;  Surgeon: Jolaine Artist, MD;  Location: [place];  Service: Cardiovascular;  Laterality: N/A;   TONSILLECTOMY      Current Outpatient Medications  Medication Sig Dispense Refill   acetaminophen (TYLENOL) 500 MG tablet Take 1,000 mg by mouth every 6 (six) hours as needed for moderate pain or headache.     albuterol (VENTOLIN HFA) 108 (90 Base) MCG/ACT inhaler Inhale 1-2 puffs into the lungs every 6 (six) hours as needed for wheezing or shortness of breath.     apixaban (ELIQUIS) 2.5 MG TABS tablet Take 1 tablet (2.5 mg total) by mouth 2 (two) times daily. 180  tablet 1   atorvastatin (LIPITOR) 20 MG tablet Take 20 mg by mouth daily.     carvedilol (COREG) 25 MG tablet Take 1 tablet (25 mg total) by mouth 2 (two) times daily with a meal. TAKE 1 TABLET TWICE DAILY (DOSE INCREASE) 180 tablet 3   famotidine (PEPCID) 40 MG tablet Take 40 mg by mouth every evening.      furosemide (LASIX) 40 MG tablet TAKE 1 TABLET (40 MG TOTAL) BY MOUTH DAILY AS NEEDED FOR EDEMA. 90 tablet 0   hydrALAZINE (APRESOLINE) 25 MG tablet Take 0.5 tablets (12.5 mg total) by mouth in the morning and at bedtime. 90 tablet 1   isosorbide mononitrate (IMDUR) 30 MG 24 hr tablet Take 1 tablet (30 mg total) by mouth daily. 90 tablet 3   mexiletine (MEXITIL) 150 MG capsule TAKE 2 CAPSULES BY MOUTH EVERY 12 HOURS. 180 capsule 3   Multiple Vitamin (MULTIVITAMIN WITH MINERALS) TABS tablet Take 1 tablet by mouth daily.      pantoprazole (PROTONIX) 40 MG tablet Take 40 mg by mouth daily.     sotalol (BETAPACE) 80 MG tablet TAKE 1 TABLET TWICE DAILY 180 tablet 2   VASCEPA 1 g capsule Take 2 capsules (2 g total) by mouth 2 (two) times daily. 360 capsule 3   No current facility-administered medications for this visit.    Allergies:   Ranolazine, Novocain [procaine], and Amiodarone   Social History:  The patient  reports that he has never smoked. He has never used smokeless tobacco. He reports that he does not currently use alcohol. He reports that he does not use drugs.   Family History:  The patient's family history includes Alzheimer's disease in his mother and another family member; COPD in his daughter and sister; Depression in his sister; Esophageal cancer in his sister; Heart disease in his sister; Hyperlipidemia in his sister; Hypertension in his sister.  ROS:  Please see the history of present illness.   All other systems are reviewed and otherwise negative.   PHYSICAL EXAM:  VS:  There were no vitals taken for this visit. BMI: There is no height or weight on file to calculate  BMI. Well nourished, well developed, in no acute distress  HEENT: normocephalic, atraumatic  Neck: no JVD, carotid bruits or masses Cardiac:  RRR; no significant murmurs, no rubs, or gallops Lungs:  CTA b/l, no wheezing, rhonchi or rales  Abd: soft, nontender, MS: no deformity,  age appropriate atrophy Ext:  no edema  Skin: warm and dry, no rash Neuro:  No gross deficits appreciated Psych: euthymic mood, full affect  ICD site is stable, no tethering or discomfort   EKG:  Done today and reviewed by myself: AV paced, QTc is OK given paced QRS 160   ICD interrogation done today and reviewed by myself:   Battery and lead measurements are good No AF 2 treated VT episodes, 21 monitored episodes, all are 01/31/21, same time frame The 2 VTs are same MMVT , both sucesssful ATP x1 all monitored episodes are the same day/time as the treated, and of same morphology, that dip out of detection rate  Non other events   09/07/2019: TTE IMPRESSIONS   1. Left ventricular ejection fraction, by estimation, is 30 to 35%. The  left ventricle has moderately decreased function. Left ventricular  endocardial border not optimally defined to evaluate regional wall motion.  Left ventricular diastolic parameters  are consistent with Grade II diastolic dysfunction (pseudonormalization).  Elevated left ventricular end-diastolic pressure.   2. Right ventricular systolic function is moderately reduced. The right  ventricular size is normal. Tricuspid regurgitation signal is inadequate  for assessing PA pressure.   3. Left atrial size was mildly dilated.   4. The mitral valve is abnormal. Trivial mitral valve regurgitation. No  evidence of mitral stenosis.   5. The aortic valve has been repaired/replaced. Aortic valve  regurgitation is not visualized. There is a 23 mm Magna Ease pericardial  valve present in the aortic position. Procedure Date: 03/10/2008. Echo  findings are consistent with normal  structure and   function of the aortic valve prosthesis. Aortic valve mean gradient  measures 6.0 mmHg.    05/31/17; R/LHC Assessment: 1. 3v CAD with stable revascularization with patent LIMA-LAD, SVG-diagonal and SVG-RCA 2. 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 Normal cardiac output - no evidence of thyrotoxicosis heart disease or PH  05/27/17: TTE Study Conclusions - Left ventricle: The cavity size was normal. Wall thickness was   increased in a pattern of mild LVH. Systolic function was   severely reduced. The estimated ejection fraction was in the   range of 20% to 25%. There is akinesis of the inferolateral,   inferior, and inferoseptal myocardium. Features are consistent   with a pseudonormal left ventricular filling pattern, with   concomitant abnormal relaxation and increased filling pressure   (grade 2 diastolic dysfunction). - Aortic valve: A pericardial tissue valve bioprosthesis was   present and functioning normally. Peak velocity (S): 226 cm/s.   Mean gradient (S): 10 mm Hg. Valve area (VTI): 1.52 cm^2. Valve   area (Vmax): 1.5 cm^2. Valve area (Vmean): 1.59 cm^2. - Mitral valve: Calcified annulus. Mildly thickened leaflets .   There was mild regurgitation. - Left atrium: The atrium was severely dilated. - Right ventricle: Pacer wire or catheter noted in right ventricle. - Pulmonary arteries: Systolic pressure was mildly increased. PA   peak pressure: 35 mm Hg (S). Impressions: - Compared to the prior study, there has been no significant   interval change.  Recent Labs: 09/01/2020: Hemoglobin 14.3 02/07/2021: ALT 18 02/21/2021: BUN 34; Creatinine, Ser 2.11; Magnesium 2.4; Potassium 5.2; Sodium 140  02/07/2021: Chol/HDL Ratio 3.3; Cholesterol, Total 129; HDL 39; LDL Chol Calc (NIH) 51; Triglycerides 244   CrCl cannot be calculated (Unknown ideal weight.).   Wt Readings from Last 3 Encounters:  02/07/21 204 lb  6.4 oz (92.7 kg)  12/21/20 201 lb (91.2 kg)  09/01/20 194 lb (88 kg)     Other studies reviewed: Additional studies/records reviewed today include: summarized above  ASSESSMENT AND PLAN:  1. VT  2. ICD      Intact device function      No programming changes made      On mexiletine and sotalol      QTc stable  This is his 1st episode of VT in some time Already on 2 AAD Slow wobbled in;out of 1st VT zone, though did get appropriate and successful ATP Labs looked OK For now, no changes   3. CAD     No anginal complaints, with excellent exertional capacity     On BB, statin, no ASA w/OAC     C/w Dr. Radford Pax  4. ICM     On BB, hydralazine, Imdur, and diuretic     No on ACE/ARB 2/2 renal insuff      Exam does not suggest volume OL     Optivol looks great       5. VHD Bioprosthetic AVR     Functioning well by TTE in May 2021  6. Hx of orthostatic hypotension     No symptoms of late      7. Paroxysmal AFib, flutter     CHA2DS2Vasc is 5, on Eliquis, appropriately dosed for age/renal insuff     0% burden         Disposition: he is reluctant to have early follow, agreeable to 4-6 months, sooner if needed   Current medicines are reviewed at length with the patient today.  The patient did not have any concerns regarding medicines.  Venetia Night, PA-C 02/24/2021 5:24 AM     [address]  [place] [address] [phone] (office)  [phone] (fax)

## 2021-02-24 NOTE — Patient Instructions (Signed)
Medication Instructions:   Your physician recommends that you continue on your current medications as directed. Please refer to the Current Medication list given to you today.  *If you need a refill on your cardiac medications before your next appointment, please call your pharmacy*   Lab Work: [place]   If you have labs (blood work) drawn today and your tests are completely normal, you will receive your results only by: [place] (if you have MyChart) OR A paper copy in the mail If you have any lab test that is abnormal or we need to change your treatment, we will call you to review the results.   Testing/Procedures: NONE ORDERED  TODAY    Follow-Up: At [facility], you and your health needs are our priority.  As part of our continuing mission to provide you with exceptional heart care, we have created designated Provider Care Teams.  These Care Teams include your primary Cardiologist (physician) and Advanced Practice Providers (APPs -  Physician Assistants and Nurse Practitioners) who all work together to provide you with the care you need, when you need it.  We recommend signing up for the patient portal called "MyChart".  Sign up information is provided on this After Visit Summary.  MyChart is used to connect with patients for Virtual Visits (Telemedicine).  Patients are able to view lab/test results, encounter notes, upcoming appointments, etc.  Non-urgent messages can be sent to your provider as well.   To learn more about what you can do with MyChart, go to [email or web address].    Your next appointment:   4 month(s)  The format for your next appointment:   In Person  Provider:   You will see one of the following Advanced Practice Providers on your designated Care Team:   Tommye Standard, [place] Legrand Como "Jonni Sanger" Chalmers Cater, [place]  Other Instructions

## 2021-02-27 ENCOUNTER — Telehealth: Payer: Self-pay

## 2021-02-27 NOTE — Telephone Encounter (Signed)
**Note De-Identified Demetrios Byron Obfuscation** The pt left his completed BMSPAF application for Eliquis at the office with financial documents.  I added his med and allergies lists,ins info, and I have completed the providers page of the application I have emailed all to Dr [employer] nurse so she can obtain his signature, date it, and to fax all to [facility] at the fax number written on the cover letter included or to place in Medical records to be faxed.

## 2021-03-01 NOTE — Telephone Encounter (Signed)
Faxed, confirmation received 

## 2021-03-09 NOTE — Telephone Encounter (Signed)
**Note De-Identified [name] Obfuscation** Letter received [name] fax from [facility] stating that they denied the pt for assistance with Eliquis. Reason for denial: Documentation of 3% out of pocket RX expenses, based on household adjusted gross income, not met.  The letter states that they have notified the pt of this denial as well.

## 2021-04-10 ENCOUNTER — Other Ambulatory Visit: Payer: Self-pay | Admitting: Cardiology

## 2021-04-23 ENCOUNTER — Other Ambulatory Visit: Payer: Self-pay | Admitting: Cardiology

## 2021-04-23 DIAGNOSIS — I5042 Chronic combined systolic (congestive) and diastolic (congestive) heart failure: Secondary | ICD-10-CM

## 2021-05-04 DIAGNOSIS — E1122 Type 2 diabetes mellitus with diabetic chronic kidney disease: Secondary | ICD-10-CM | POA: Diagnosis not present

## 2021-05-04 DIAGNOSIS — E7800 Pure hypercholesterolemia, unspecified: Secondary | ICD-10-CM | POA: Diagnosis not present

## 2021-05-04 DIAGNOSIS — N184 Chronic kidney disease, stage 4 (severe): Secondary | ICD-10-CM | POA: Diagnosis not present

## 2021-05-04 DIAGNOSIS — D6869 Other thrombophilia: Secondary | ICD-10-CM | POA: Diagnosis not present

## 2021-05-04 DIAGNOSIS — I4891 Unspecified atrial fibrillation: Secondary | ICD-10-CM | POA: Diagnosis not present

## 2021-05-04 DIAGNOSIS — I2510 Atherosclerotic heart disease of native coronary artery without angina pectoris: Secondary | ICD-10-CM | POA: Diagnosis not present

## 2021-05-04 DIAGNOSIS — I10 Essential (primary) hypertension: Secondary | ICD-10-CM | POA: Diagnosis not present

## 2021-05-04 DIAGNOSIS — I255 Ischemic cardiomyopathy: Secondary | ICD-10-CM | POA: Diagnosis not present

## 2021-05-04 DIAGNOSIS — I509 Heart failure, unspecified: Secondary | ICD-10-CM | POA: Diagnosis not present

## 2021-05-12 ENCOUNTER — Other Ambulatory Visit: Payer: Self-pay | Admitting: Cardiology

## 2021-05-16 ENCOUNTER — Telehealth: Payer: Self-pay | Admitting: Student-PharmD

## 2021-05-16 ENCOUNTER — Ambulatory Visit (INDEPENDENT_AMBULATORY_CARE_PROVIDER_SITE_OTHER): Payer: Medicare HMO

## 2021-05-16 DIAGNOSIS — I255 Ischemic cardiomyopathy: Secondary | ICD-10-CM | POA: Diagnosis not present

## 2021-05-16 LAB — CUP PACEART REMOTE DEVICE CHECK
Battery Remaining Longevity: 16 mo
Battery Voltage: 2.89 V
Brady Statistic AP VP Percent: 99.26 %
Brady Statistic AP VS Percent: 0.49 %
Brady Statistic AS VP Percent: 0.25 %
Brady Statistic AS VS Percent: 0 %
Brady Statistic RA Percent Paced: 99.55 %
Brady Statistic RV Percent Paced: 99.4 %
Date Time Interrogation Session: 20230207012405
HighPow Impedance: 70 Ohm
Implantable Lead Implant Date: 20170113
Implantable Lead Implant Date: 20170113
Implantable Lead Implant Date: 20170113
Implantable Lead Location: 753858
Implantable Lead Location: 753859
Implantable Lead Location: 753860
Implantable Lead Model: 4298
Implantable Lead Model: 5076
Implantable Pulse Generator Implant Date: 20170113
Lead Channel Impedance Value: 342 Ohm
Lead Channel Impedance Value: 380 Ohm
Lead Channel Impedance Value: 437 Ohm
Lead Channel Impedance Value: 437 Ohm
Lead Channel Impedance Value: 456 Ohm
Lead Channel Impedance Value: 456 Ohm
Lead Channel Impedance Value: 570 Ohm
Lead Channel Impedance Value: 589 Ohm
Lead Channel Impedance Value: 760 Ohm
Lead Channel Impedance Value: 817 Ohm
Lead Channel Impedance Value: 950 Ohm
Lead Channel Impedance Value: 950 Ohm
Lead Channel Impedance Value: 969 Ohm
Lead Channel Pacing Threshold Amplitude: 0.375 V
Lead Channel Pacing Threshold Amplitude: 0.75 V
Lead Channel Pacing Threshold Amplitude: 0.875 V
Lead Channel Pacing Threshold Pulse Width: 0.4 ms
Lead Channel Pacing Threshold Pulse Width: 0.4 ms
Lead Channel Pacing Threshold Pulse Width: 0.4 ms
Lead Channel Sensing Intrinsic Amplitude: 0.375 mV
Lead Channel Sensing Intrinsic Amplitude: 0.375 mV
Lead Channel Sensing Intrinsic Amplitude: 11.875 mV
Lead Channel Sensing Intrinsic Amplitude: 11.875 mV
Lead Channel Setting Pacing Amplitude: 1.5 V
Lead Channel Setting Pacing Amplitude: 2 V
Lead Channel Setting Pacing Amplitude: 2 V
Lead Channel Setting Pacing Pulse Width: 0.4 ms
Lead Channel Setting Pacing Pulse Width: 0.4 ms
Lead Channel Setting Sensing Sensitivity: 0.3 mV

## 2021-05-16 MED ORDER — ATORVASTATIN CALCIUM 40 MG PO TABS: 40.0000 mg | ORAL_TABLET | Freq: Every day | ORAL | 3 refills | Status: DC

## 2021-05-16 NOTE — Telephone Encounter (Signed)
Saw patient in lipid clinic 02/21/21 and Vascepa was started for elevated TG. He was approved for the [employer] to bring cost to $0. Pt requested to have lipid panel rechecked at PCP visit in January.   Lipid panel 05/04/21 per [facility]:  TC 117 HDL 38 LDL 44 TG 220  LDL remains at goal <70 mg/dL. TG remain above goal <150 mg/dL and have improved only slightly to 220 from 244 in November. May not be seeing significant TG lowering since he was already on a fish oil prior to using Vascepa, however Vascepa does provide additional CV benefit.   Called to discuss lab results with patient. He stated that he was fasting for these labs. He does not drink alcohol and avoids sugary foods. He does not remember if he has ever taken a higher dose of atorvastatin before. He is agreeable to increasing the dose to 40 mg to see if this improves his TG. He has 3 bottles of 20 mg tablets at home, so he will take two 20 mg tablets to equal 40 mg total. Can recheck lipid panel at upcoming visit in March.

## 2021-05-19 NOTE — Progress Notes (Signed)
Remote ICD transmission.

## 2021-06-25 NOTE — Progress Notes (Signed)
? ?Cardiology Office Note ?Date:  06/25/2021  ?Patient ID:  [name], [name] [birth year], MRN [id number] ?PCP:  [name], MD  ?Cardiologist:  Dr. Radford Pax ?AHF: Dr. Haroldine Laws ?Electrophysiologist: Dr. Curt Bears ? ?  ?Chief Complaint:  planned 6 mo f/u  ? ?History of Present Illness: ?[name] is a 88 y.o. male with history of CAD/VHD s/p CABG/bioprosthetic AVR in 2009, chronic CHF (systolic), ICM, PAF, orthostatic hypotension, VT, ICD.   ? ?March 2019 required right thoras (x2), April another 2L drained Admitted 3/9 -> 06/20/17 with recurrent pleural effusion and markedly elevated BNP. Underwent Thoracentesis x 2. Had NSVT up to 40 beats, electrolytes replaced aggressively.  ?PYP scan 06/20/17 NOT suggestive of TTR amyloid)(and eventually placement of Pleurex catheter (Dr. Nils Pyle) and removed June, CKD (III). ? ?He has had recurrent VT over the years with appropriate therapies both ATP and HV therapy ?He has also had double tachycardia  ? ?09/25/19 ?Seen by A. Lynnell Jude, NP 2/2 to having been found on remotes to have prolonged episode of VT under detection that eventually accelerated enuough to get HV therapy. ?On arrival to his visit he was in a double tachycardia and with JA supervision pace terminated the VT though unable to terminate his AFlutter ?His sotalol was had been increased at a recent hospital stay though for unclear reasons discharged on 21m BID ?His sotalol was resumed to 881mBID, his VT zone HR was reduced. ? ? ?He l A. [place], [place]/14[place]20[place], he remained in ,AF had mot missed any OAC doses and planned for DCCV > 10/28/19 successful ? ?Saw Dr. CaCurt Bearsay 2022, fatigued, in AFib, planned for DCCV ? ? ?He last saw EP by A. Tillery, PA-C, 12/2020, he was doing well, no changes were made. ? ?He saw Dr. TuRadford Paxost recently 02/07/21, also reported as doing well , no changes were made ? ?Device clinic note 02/14/21 reporting VT that occurred 01/28/21, successfully terminated by ATP, pt was  asymptomatic, advised not to drive ?Dr. CaCurt Bearsdvised updating labs/lytes and APP follow up ? 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 ?Discussed VT rates wobbled about detection rate. ? ?TODAY ?He is doing well ?Gets winded with heavier activities, though since his last visit has intentially increased his activities/exercise and cut out sweets, has lost 6lbs! ?No CP, no rest symptoms ?No dizzy spells, near syncope or syncope. ?No bleeding or signs of bleeding ?No device shocks ? ? ?Device information ?MDT CRT-D, implanted1/13/17 ?+ hx of numerous appropriate therapies VT ? ? ?AAD Hx: ?Amiodarone stopped Jan 2019, 2/2 SOB,  No clear finding of amio tox ?>> mexiletine started ?More VT and sotalol was added Dec 2020 ? ? ?Past Medical History:  ?Diagnosis Date  ? Arthritis   ? "minor everywhere" (04/22/2015)  ? Asthma   ? "a touch"  ? Barrett esophagus   ? CAD (coronary artery disease)   ? a. s/p 3 vvessel CABG with LIMA to LAD, SVG to diagonal 1, SVG to RCA 12/09.  ?  Cardiac resynchronization therapy defibrillator (CRT-D) in place   ? Chronic anticoagulation 01/23/2013  ? Chronic combined systolic and diastolic CHF (congestive heart failure) ([place])   ? dry weight 197-200lbs.  ? CKD (chronic kidney disease) stage 3, GFR 30-59 ml/min (HCC) 08/23/2014  ? Complication of anesthesia   ? DCM (dilated cardiomyopathy) ([place])   ? initially concerned for TTP amyloidosis but Tc-PYP scan 06/2017 was not consistent with amyloid and SPEP/UPEP with no M spike. EF 20-25% by echo 2019  ?  Diverticulitis 08/2019  ? Dyslipidemia 01/23/2013  ? Essential hypertension 01/23/2013  ? GERD (gastroesophageal reflux disease)   ? GI bleed 05/08/2017  ? H/O: rheumatic fever   ? Hepatitis 1957  ? "don't know what kind:  ? History of hiatal hernia   ? History of PFTs   ? a. Amiodarone started 5/16 >> PFTs w/ DLCO 6/16:  FEV1 72% predicted, FEV1/FVC 66%, DLCO 66% >> minimal reversible obstructive airways disease with mild diffusion defect (suggestive of emphysema but absence of hyperinflation inconsistent with dx)  ? Hyperkalemia 08/23/2014  ? Hyperthyroidism 05/26/2017  ? Leg weakness 01/28/2017  ? Multiple thyroid nodules 08/13/2017  ? Orthostatic hypotension   ? Persistent atrial fibrillation ([place]) 01/23/2013  ? Pleural effusion on right 05/26/2017  ? Pneumonia 08/2014  ? "dr thought I may have had a touch"  ? Pre-diabetes   ? Protein-calorie malnutrition, severe 05/27/2017  ? S/P AVR (aortic valve replacement) 2009  ? a. severe AS s/p AVR with pericardial tissue valve 2009.  ? Shingles 08/23/2014  ? VT (ventricular tachycardia) 05/06/2017  ? ? ?Past Surgical History:  ?Procedure Laterality Date  ? AORTIC VALVE REPLACEMENT  with 23-mm Magna Ease pericardial valve, model number  ? with 23-mm Magna Ease pericardial valve, model number3300TFX, serial number [id number]   ? APPENDECTOMY  1940s  ? BI-VENTRICULAR IMPLANTABLE CARDIOVERTER DEFIBRILLATOR  (CRT-D)  04/22/2015  ? CARDIAC CATHETERIZATION N/A 11/04/2014  ? Procedure: Left Heart Cath and Cors/Grafts Angiography;  Surgeon: Leonie Man, MD;  Location: [facility];  Service: Cardiovascular;  Laterality: N/A;  ? CARDIAC VALVE REPLACEMENT    ? CARDIOVERSION N/A 01/27/2013  ? Procedure: CARDIOVERSION;  Surgeon: Sueanne Margarita, MD;  Location: [facility];  Service: Cardiovascular;  Laterality: N/A;  ? CARDIOVERSION N/A 08/25/2014  ? Procedure: CARDIOVERSION;  Surgeon: Sanda Klein, MD;  Location: [facility];  Service: Cardiovascular;  Laterality: N/A;  ?  CARDIOVERSION N/A 05/28/2018  ? Procedure: CARDIOVERSION;  Surgeon: Pixie Casino, MD;  Location: [facility];  Service: Cardiovascular;  Laterality: N/A;  ? CARDIOVERSION N/A 10/28/2019  ? Procedure: CARDIOVERSION;  Surgeon: Sueanne Margarita, MD;  Location: [facility];  Service: Cardiovascular;  Laterality: N/A;  ? CARDIOVERSION N/A 09/01/2020  ? Procedure: CARDIOVERSION;  Surgeon: Lelon Perla, MD;  Location: [facility];  Service: Cardiovascular;  Laterality: N/A;  ? CATARACT EXTRACTION W/ INTRAOCULAR LENS  IMPLANT, BILATERAL Bilateral   ? CHEST TUBE INSERTION Right 07/26/2017  ? Procedure: INSERTION PLEURAL DRAINAGE CATHETER - RIGHT;  Surgeon: Ivin Poot, MD;  Location: [facility];  Service: Thoracic;  Laterality: Right;  ? CORONARY ARTERY BYPASS GRAFT  03/10/2008  ? x 3 Dr. Roxan Hockey  ? EP IMPLANTABLE DEVICE N/A 04/22/2015  ? Procedure: BiV ICD Insertion CRT-D;  Surgeon: [name], MD;  Location: [facility];  Service: Cardiovascular;  Laterality: N/A;  ? ESOPHAGOGASTRODUODENOSCOPY (EGD) WITH ESOPHAGEAL DILATION  X1  ? ESOPHAGOGASTRODUODENOSCOPY (EGD) WITH PROPOFOL N/A 05/09/2017  ? Procedure: ESOPHAGOGASTRODUODENOSCOPY (EGD)  WITH PROPOFOL;  Surgeon: Carol Ada, MD;  Location: [facility];  Service: Endoscopy;  Laterality: N/A;  ? IR THORACENTESIS ASP PLEURAL SPACE W/IMG GUIDE  05/27/2017  ? IR THORACENTESIS ASP PLEURAL SPACE W/IMG GUIDE  06/17/2017  ? IR THORACENTESIS ASP PLEURAL SPACE W/IMG GUIDE  06/18/2017  ? IR THORACENTESIS ASP PLEURAL SPACE W/IMG GUIDE  07/11/2017  ? REMOVAL OF PLEURAL DRAINAGE CATHETER Right 09/13/2017  ? Procedure: REMOVAL OF PLEURAL DRAINAGE CATHETER;  Surgeon: Ivin Poot, MD;  Location: [facility];  Service: Thoracic;  Laterality: Right;  ? RIGHT HEART CATH AND CORONARY/GRAFT ANGIOGRAPHY N/A 05/31/2017  ? Procedure: RIGHT HEART CATH AND CORONARY/GRAFT ANGIOGRAPHY;  Surgeon: Jolaine Artist, MD;  Location: [facility];  Service: Cardiovascular;   Laterality: N/A;  ? TONSILLECTOMY    ? ? ?Current Outpatient Medications  ?Medication Sig Dispense Refill  ? acetaminophen (TYLENOL) 500 MG tablet Take 1,000 mg by mouth every 6 (six) hours as needed for moderate pain or headache.

## 2021-06-27 ENCOUNTER — Other Ambulatory Visit: Payer: Self-pay

## 2021-06-27 ENCOUNTER — Ambulatory Visit: Payer: Medicare HMO | Admitting: Physician Assistant

## 2021-06-27 ENCOUNTER — Encounter: Payer: Self-pay | Admitting: Physician Assistant

## 2021-06-27 VITALS — BP 100/76 | HR 90 | Ht 72.0 in | Wt 200.0 lb

## 2021-06-27 DIAGNOSIS — Z79899 Other long term (current) drug therapy: Secondary | ICD-10-CM

## 2021-06-27 DIAGNOSIS — I480 Paroxysmal atrial fibrillation: Secondary | ICD-10-CM

## 2021-06-27 DIAGNOSIS — I472 Ventricular tachycardia, unspecified: Secondary | ICD-10-CM

## 2021-06-27 DIAGNOSIS — Z95810 Presence of automatic (implantable) cardiac defibrillator: Secondary | ICD-10-CM

## 2021-06-27 DIAGNOSIS — I2510 Atherosclerotic heart disease of native coronary artery without angina pectoris: Secondary | ICD-10-CM | POA: Diagnosis not present

## 2021-06-27 DIAGNOSIS — I255 Ischemic cardiomyopathy: Secondary | ICD-10-CM

## 2021-06-27 DIAGNOSIS — I5042 Chronic combined systolic (congestive) and diastolic (congestive) heart failure: Secondary | ICD-10-CM | POA: Diagnosis not present

## 2021-06-27 LAB — CUP PACEART INCLINIC DEVICE CHECK
Battery Remaining Longevity: 14 mo
Battery Voltage: 2.87 V
Brady Statistic AP VP Percent: 99.26 %
Brady Statistic AP VS Percent: 0.43 %
Brady Statistic AS VP Percent: 0.31 %
Brady Statistic AS VS Percent: 0 %
Brady Statistic RA Percent Paced: 99.46 %
Brady Statistic RV Percent Paced: 99.45 %
Date Time Interrogation Session: 20230321172316
HighPow Impedance: 84 Ohm
Implantable Lead Implant Date: 20170113
Implantable Lead Implant Date: 20170113
Implantable Lead Implant Date: 20170113
Implantable Lead Location: 753858
Implantable Lead Location: 753859
Implantable Lead Location: 753860
Implantable Lead Model: 4298
Implantable Lead Model: 5076
Implantable Pulse Generator Implant Date: 20170113
Lead Channel Impedance Value: 1026 Ohm
Lead Channel Impedance Value: 1083 Ohm
Lead Channel Impedance Value: 456 Ohm
Lead Channel Impedance Value: 456 Ohm
Lead Channel Impedance Value: 494 Ohm
Lead Channel Impedance Value: 513 Ohm
Lead Channel Impedance Value: 513 Ohm
Lead Channel Impedance Value: 532 Ohm
Lead Channel Impedance Value: 589 Ohm
Lead Channel Impedance Value: 627 Ohm
Lead Channel Impedance Value: 855 Ohm
Lead Channel Impedance Value: 950 Ohm
Lead Channel Impedance Value: 988 Ohm
Lead Channel Pacing Threshold Amplitude: 0.375 V
Lead Channel Pacing Threshold Amplitude: 0.75 V
Lead Channel Pacing Threshold Amplitude: 0.875 V
Lead Channel Pacing Threshold Pulse Width: 0.4 ms
Lead Channel Pacing Threshold Pulse Width: 0.4 ms
Lead Channel Pacing Threshold Pulse Width: 0.4 ms
Lead Channel Sensing Intrinsic Amplitude: 1.25 mV
Lead Channel Sensing Intrinsic Amplitude: 10.125 mV
Lead Channel Sensing Intrinsic Amplitude: 12.125 mV
Lead Channel Sensing Intrinsic Amplitude: 3.375 mV
Lead Channel Setting Pacing Amplitude: 1.5 V
Lead Channel Setting Pacing Amplitude: 2 V
Lead Channel Setting Pacing Amplitude: 2 V
Lead Channel Setting Pacing Pulse Width: 0.4 ms
Lead Channel Setting Pacing Pulse Width: 0.4 ms
Lead Channel Setting Sensing Sensitivity: 0.3 mV

## 2021-06-27 NOTE — Patient Instructions (Signed)
Medication Instructions:  ? ?Your physician recommends that you continue on your current medications as directed. Please refer to the Current Medication list given to you today. ? ?*If you need a refill on your cardiac medications before your next appointment, please call your pharmacy* ? ? ?Lab Work: BMET MAG LIPIDS AND CBC TODAY  ? ?If you have labs (blood work) drawn today and your tests are completely normal, you will receive your results only by: ?MyChart Message (if you have MyChart) OR ?A paper copy in the mail ?If you have any lab test that is abnormal or we need to change your treatment, we will call you to review the results. ? ? ?Testing/Procedures: NONE ORDERED  TODAY ? ? ? ? ?Follow-Up: ?At [facility], you and your health needs are our priority.  As part of our continuing mission to provide you with exceptional heart care, we have created designated Provider Care Teams.  These Care Teams include your primary Cardiologist (physician) and Advanced Practice Providers (APPs -  Physician Assistants and Nurse Practitioners) who all work together to provide you with the care you need, when you need it. ? ?We recommend signing up for the patient portal called "MyChart".  Sign up information is provided on this After Visit Summary.  MyChart is used to connect with patients for Virtual Visits (Telemedicine).  Patients are able to view lab/test results, encounter notes, upcoming appointments, etc.  Non-urgent messages can be sent to your provider as well.   ?To learn more about what you can do with MyChart, go to [email or web address].   ? ?Your next appointment:   ?6 month(s) ? ?The format for your next appointment:   ?In Person ? ?Provider:   ?You may see [name], MD or one of the following Advanced Practice Providers on your designated Care Team:   ?Tommye Standard, PA-C ?Legrand Como "Jonni Sanger" Dunbar, PA-C  ? ? ?Other Instructions ? ?

## 2021-06-28 ENCOUNTER — Telehealth: Payer: Self-pay | Admitting: *Deleted

## 2021-06-28 ENCOUNTER — Other Ambulatory Visit: Payer: Self-pay | Admitting: *Deleted

## 2021-06-28 LAB — BASIC METABOLIC PANEL
BUN/Creatinine Ratio: 18 (ref 10–24)
BUN: 41 mg/dL — ABNORMAL HIGH (ref 8–27)
CO2: 21 mmol/L (ref 20–29)
Calcium: 9.2 mg/dL (ref 8.6–10.2)
Chloride: 99 mmol/L (ref 96–106)
Creatinine, Ser: 2.27 mg/dL — ABNORMAL HIGH (ref 0.76–1.27)
Glucose: 123 mg/dL — ABNORMAL HIGH (ref 70–99)
Potassium: 4.9 mmol/L (ref 3.5–5.2)
Sodium: 137 mmol/L (ref 134–144)
eGFR: 27 mL/min/{1.73_m2} — ABNORMAL LOW (ref >59)

## 2021-06-28 LAB — CBC
Hematocrit: 42.9 % (ref 37.5–51.0)
Hemoglobin: 14.5 g/dL (ref 13.0–17.7)
MCH: 30.8 pg (ref 26.6–33.0)
MCHC: 33.8 g/dL (ref 31.5–35.7)
MCV: 91 fL (ref 79–97)
Platelets: 264 10*3/uL (ref 150–450)
RBC: 4.71 x10E6/uL (ref 4.14–5.80)
RDW: 13 % (ref 11.6–15.4)
WBC: 9.8 10*3/uL (ref 3.4–10.8)

## 2021-06-28 LAB — LIPID PANEL
Chol/HDL Ratio: 2.8 ratio (ref 0.0–5.0)
Cholesterol, Total: 106 mg/dL (ref 100–199)
HDL: 38 mg/dL — ABNORMAL LOW (ref >39)
LDL Chol Calc (NIH): 37 mg/dL (ref 0–99)
Triglycerides: 193 mg/dL — ABNORMAL HIGH (ref 0–149)
VLDL Cholesterol Cal: 31 mg/dL (ref 5–40)

## 2021-06-28 LAB — MAGNESIUM: Magnesium: 2.3 mg/dL (ref 1.6–2.3)

## 2021-06-28 MED ORDER — SOTALOL HCL 120 MG PO TABS: 120.0000 mg | ORAL_TABLET | Freq: Every day | ORAL | 1 refills | Status: DC

## 2021-06-28 NOTE — Telephone Encounter (Signed)
Lvm for patient to call back for results and reccomendations ?

## 2021-07-06 DIAGNOSIS — H04123 Dry eye syndrome of bilateral lacrimal glands: Secondary | ICD-10-CM | POA: Diagnosis not present

## 2021-07-06 DIAGNOSIS — E1122 Type 2 diabetes mellitus with diabetic chronic kidney disease: Secondary | ICD-10-CM | POA: Diagnosis not present

## 2021-07-19 DIAGNOSIS — N184 Chronic kidney disease, stage 4 (severe): Secondary | ICD-10-CM | POA: Diagnosis not present

## 2021-07-25 DIAGNOSIS — N184 Chronic kidney disease, stage 4 (severe): Secondary | ICD-10-CM | POA: Diagnosis not present

## 2021-07-25 DIAGNOSIS — I5022 Chronic systolic (congestive) heart failure: Secondary | ICD-10-CM | POA: Diagnosis not present

## 2021-07-25 DIAGNOSIS — E1122 Type 2 diabetes mellitus with diabetic chronic kidney disease: Secondary | ICD-10-CM | POA: Diagnosis not present

## 2021-07-25 DIAGNOSIS — I129 Hypertensive chronic kidney disease with stage 1 through stage 4 chronic kidney disease, or unspecified chronic kidney disease: Secondary | ICD-10-CM | POA: Diagnosis not present

## 2021-07-25 DIAGNOSIS — E872 Acidosis, unspecified: Secondary | ICD-10-CM | POA: Diagnosis not present

## 2021-07-25 DIAGNOSIS — K2270 Barrett's esophagus without dysplasia: Secondary | ICD-10-CM | POA: Diagnosis not present

## 2021-07-25 DIAGNOSIS — E875 Hyperkalemia: Secondary | ICD-10-CM | POA: Diagnosis not present

## 2021-07-30 ENCOUNTER — Encounter: Payer: Self-pay | Admitting: Cardiology

## 2021-07-31 ENCOUNTER — Telehealth: Payer: Self-pay

## 2021-07-31 NOTE — Telephone Encounter (Signed)
**Note De-Identified Ether Wolters Obfuscation** Per the pt [facility] message concerning the cost of his Vascepa I called [facility] to ask if a PA is needed for the pts Vascepa and was advised that his next refill is not available until 08/07/2021. ?The pharmacist checked the price and advised me that the pts cost for Vasepa is $0 for a 90 day supply. ?She stated that there is a misunderstanding and asked me to have the pt call them. ? ?I called the pt and he verified that he normally pays $0 for his Vascepa but thought his cost was going up because he has gotten two 90 day refills and his plan only cover $2500 a year. ? ?I advised him that we will have to wait until 05/01 to see if his ins will pay and if not we will work on it. ? ?He thanked me for calling him to discuss. ?

## 2021-08-15 ENCOUNTER — Ambulatory Visit (INDEPENDENT_AMBULATORY_CARE_PROVIDER_SITE_OTHER): Payer: Medicare HMO

## 2021-08-15 DIAGNOSIS — I255 Ischemic cardiomyopathy: Secondary | ICD-10-CM

## 2021-08-15 LAB — CUP PACEART REMOTE DEVICE CHECK
Battery Remaining Longevity: 12 mo
Battery Voltage: 2.87 V
Brady Statistic AP VP Percent: 98.59 %
Brady Statistic AP VS Percent: 0.7 %
Brady Statistic AS VP Percent: 0.7 %
Brady Statistic AS VS Percent: 0.01 %
Brady Statistic RA Percent Paced: 98.86 %
Brady Statistic RV Percent Paced: 99.03 %
Date Time Interrogation Session: 20230509001705
HighPow Impedance: 62 Ohm
Implantable Lead Implant Date: 20170113
Implantable Lead Implant Date: 20170113
Implantable Lead Implant Date: 20170113
Implantable Lead Location: 753858
Implantable Lead Location: 753859
Implantable Lead Location: 753860
Implantable Lead Model: 4298
Implantable Lead Model: 5076
Implantable Pulse Generator Implant Date: 20170113
Lead Channel Impedance Value: 380 Ohm
Lead Channel Impedance Value: 380 Ohm
Lead Channel Impedance Value: 437 Ohm
Lead Channel Impedance Value: 437 Ohm
Lead Channel Impedance Value: 437 Ohm
Lead Channel Impedance Value: 494 Ohm
Lead Channel Impedance Value: 513 Ohm
Lead Channel Impedance Value: 570 Ohm
Lead Channel Impedance Value: 779 Ohm
Lead Channel Impedance Value: 836 Ohm
Lead Channel Impedance Value: 893 Ohm
Lead Channel Impedance Value: 893 Ohm
Lead Channel Impedance Value: 969 Ohm
Lead Channel Pacing Threshold Amplitude: 0.375 V
Lead Channel Pacing Threshold Amplitude: 0.75 V
Lead Channel Pacing Threshold Amplitude: 0.75 V
Lead Channel Pacing Threshold Pulse Width: 0.4 ms
Lead Channel Pacing Threshold Pulse Width: 0.4 ms
Lead Channel Pacing Threshold Pulse Width: 0.4 ms
Lead Channel Sensing Intrinsic Amplitude: 0.375 mV
Lead Channel Sensing Intrinsic Amplitude: 0.375 mV
Lead Channel Sensing Intrinsic Amplitude: 9.625 mV
Lead Channel Sensing Intrinsic Amplitude: 9.625 mV
Lead Channel Setting Pacing Amplitude: 1.5 V
Lead Channel Setting Pacing Amplitude: 1.75 V
Lead Channel Setting Pacing Amplitude: 2 V
Lead Channel Setting Pacing Pulse Width: 0.4 ms
Lead Channel Setting Pacing Pulse Width: 0.4 ms
Lead Channel Setting Sensing Sensitivity: 0.3 mV

## 2021-08-20 DIAGNOSIS — L03116 Cellulitis of left lower limb: Secondary | ICD-10-CM | POA: Diagnosis not present

## 2021-08-29 NOTE — Progress Notes (Signed)
Remote ICD transmission.   

## 2021-10-06 DIAGNOSIS — I2510 Atherosclerotic heart disease of native coronary artery without angina pectoris: Secondary | ICD-10-CM | POA: Diagnosis not present

## 2021-10-06 DIAGNOSIS — E1122 Type 2 diabetes mellitus with diabetic chronic kidney disease: Secondary | ICD-10-CM | POA: Diagnosis not present

## 2021-10-06 DIAGNOSIS — I4891 Unspecified atrial fibrillation: Secondary | ICD-10-CM | POA: Diagnosis not present

## 2021-10-06 DIAGNOSIS — E7800 Pure hypercholesterolemia, unspecified: Secondary | ICD-10-CM | POA: Diagnosis not present

## 2021-10-06 DIAGNOSIS — I509 Heart failure, unspecified: Secondary | ICD-10-CM | POA: Diagnosis not present

## 2021-10-06 DIAGNOSIS — I10 Essential (primary) hypertension: Secondary | ICD-10-CM | POA: Diagnosis not present

## 2021-10-06 DIAGNOSIS — N184 Chronic kidney disease, stage 4 (severe): Secondary | ICD-10-CM | POA: Diagnosis not present

## 2021-10-15 ENCOUNTER — Other Ambulatory Visit (HOSPITAL_COMMUNITY): Payer: Self-pay | Admitting: Cardiology

## 2021-10-15 DIAGNOSIS — I4819 Other persistent atrial fibrillation: Secondary | ICD-10-CM

## 2021-10-16 NOTE — Telephone Encounter (Signed)
Prescription refill request for Eliquis received. Indication: Afib  Last office visit: 06/27/21 Charlcie Cradle)  Scr: 2.27 (06/27/21) Age: 88 Weight: 90.7kg  Appropriate dose and refill sent to requested pharmacy.

## 2021-10-22 DIAGNOSIS — K5792 Diverticulitis of intestine, part unspecified, without perforation or abscess without bleeding: Secondary | ICD-10-CM | POA: Diagnosis not present

## 2021-10-26 DIAGNOSIS — D6869 Other thrombophilia: Secondary | ICD-10-CM | POA: Diagnosis not present

## 2021-10-26 DIAGNOSIS — E7800 Pure hypercholesterolemia, unspecified: Secondary | ICD-10-CM | POA: Diagnosis not present

## 2021-10-26 DIAGNOSIS — N184 Chronic kidney disease, stage 4 (severe): Secondary | ICD-10-CM | POA: Diagnosis not present

## 2021-10-26 DIAGNOSIS — I2510 Atherosclerotic heart disease of native coronary artery without angina pectoris: Secondary | ICD-10-CM | POA: Diagnosis not present

## 2021-10-26 DIAGNOSIS — I10 Essential (primary) hypertension: Secondary | ICD-10-CM | POA: Diagnosis not present

## 2021-10-26 DIAGNOSIS — I4891 Unspecified atrial fibrillation: Secondary | ICD-10-CM | POA: Diagnosis not present

## 2021-10-26 DIAGNOSIS — I509 Heart failure, unspecified: Secondary | ICD-10-CM | POA: Diagnosis not present

## 2021-10-26 DIAGNOSIS — I255 Ischemic cardiomyopathy: Secondary | ICD-10-CM | POA: Diagnosis not present

## 2021-10-26 DIAGNOSIS — E1122 Type 2 diabetes mellitus with diabetic chronic kidney disease: Secondary | ICD-10-CM | POA: Diagnosis not present

## 2021-11-14 ENCOUNTER — Telehealth: Payer: Self-pay

## 2021-11-14 ENCOUNTER — Ambulatory Visit (INDEPENDENT_AMBULATORY_CARE_PROVIDER_SITE_OTHER): Payer: Medicare HMO

## 2021-11-14 DIAGNOSIS — I255 Ischemic cardiomyopathy: Secondary | ICD-10-CM | POA: Diagnosis not present

## 2021-11-14 LAB — CUP PACEART REMOTE DEVICE CHECK
Battery Remaining Longevity: 8 mo
Battery Voltage: 2.85 V
Brady Statistic AP VP Percent: 98.49 %
Brady Statistic AP VS Percent: 0.62 %
Brady Statistic AS VP Percent: 0.88 %
Brady Statistic AS VS Percent: 0.01 %
Brady Statistic RA Percent Paced: 98.88 %
Brady Statistic RV Percent Paced: 99.23 %
Date Time Interrogation Session: 20230808044223
HighPow Impedance: 65 Ohm
Implantable Lead Implant Date: 20170113
Implantable Lead Implant Date: 20170113
Implantable Lead Implant Date: 20170113
Implantable Lead Location: 753858
Implantable Lead Location: 753859
Implantable Lead Location: 753860
Implantable Lead Model: 4298
Implantable Lead Model: 5076
Implantable Pulse Generator Implant Date: 20170113
Lead Channel Impedance Value: 1026 Ohm
Lead Channel Impedance Value: 399 Ohm
Lead Channel Impedance Value: 399 Ohm
Lead Channel Impedance Value: 399 Ohm
Lead Channel Impedance Value: 437 Ohm
Lead Channel Impedance Value: 456 Ohm
Lead Channel Impedance Value: 513 Ohm
Lead Channel Impedance Value: 532 Ohm
Lead Channel Impedance Value: 589 Ohm
Lead Channel Impedance Value: 836 Ohm
Lead Channel Impedance Value: 855 Ohm
Lead Channel Impedance Value: 912 Ohm
Lead Channel Impedance Value: 912 Ohm
Lead Channel Pacing Threshold Amplitude: 0.375 V
Lead Channel Pacing Threshold Amplitude: 0.75 V
Lead Channel Pacing Threshold Amplitude: 0.875 V
Lead Channel Pacing Threshold Pulse Width: 0.4 ms
Lead Channel Pacing Threshold Pulse Width: 0.4 ms
Lead Channel Pacing Threshold Pulse Width: 0.4 ms
Lead Channel Sensing Intrinsic Amplitude: 0.75 mV
Lead Channel Sensing Intrinsic Amplitude: 0.75 mV
Lead Channel Sensing Intrinsic Amplitude: 12.25 mV
Lead Channel Sensing Intrinsic Amplitude: 12.25 mV
Lead Channel Setting Pacing Amplitude: 1.75 V
Lead Channel Setting Pacing Amplitude: 1.75 V
Lead Channel Setting Pacing Amplitude: 2 V
Lead Channel Setting Pacing Pulse Width: 0.4 ms
Lead Channel Setting Pacing Pulse Width: 0.4 ms
Lead Channel Setting Sensing Sensitivity: 0.3 mV

## 2021-11-14 NOTE — Telephone Encounter (Signed)
Scheduled remote reviewed. Normal device function.  Battery longevity 8 months. 1 treated episode, VT (avg rate 146bpm) on 10/28/21 @ 1127 initially marked as SVT, eventually wavelet template became inconsistent and ATP therapies delivered, after Rx2, seq 1-4 dirty break noted.  VT with retrograde vs SVT. 3 other short episodes. [place]- Eliquis, on Sotalol. Routed for further review.   Spoke with patient, patient does not remember anything form the date and time of episode 10/28/21 at 1127, patient does not drive, patient agreeable to appointment with R. Ursuy on 11/28/21 at 3:35pm

## 2021-11-25 NOTE — Progress Notes (Unsigned)
Cardiology Office Note Date:  11/25/2021  Patient ID:  Emad, Brechtel 01/18/1933, MRN [id number] PCP:  [name], MD  Cardiologist:  Dr. Radford Pax AHF: Dr. Haroldine Laws Electrophysiologist: Dr. Curt Bears    Chief Complaint:  planned 6 mo f/u, ATP therapies in July  History of Present Illness: KOLE HILYARD is a 88 y.o. male with history of CAD/VHD s/p CABG/bioprosthetic AVR in 2009, chronic CHF (systolic), ICM, PAF, orthostatic hypotension, VT, ICD.    March 2019 required right thoras (x2), April another 2L drained Admitted 3/9 -> 06/20/17 with recurrent pleural effusion and markedly elevated BNP. Underwent Thoracentesis x 2. Had NSVT up to 40 beats, electrolytes replaced aggressively.  PYP scan 06/20/17 NOT suggestive of TTR amyloid)(and eventually placement of Pleurex catheter (Dr. Nils Pyle) and removed June, CKD (III).  He has had recurrent VT over the years with appropriate therapies both ATP and HV therapy He has also had double tachycardia   09/25/19 Seen by A. Lynnell Jude, NP 2/2 to having been found on remotes to have prolonged episode of VT under detection that eventually accelerated enuough to get HV therapy. On arrival to his visit he was in a double tachycardia and with JA supervision pace terminated the VT though unable to terminate his AFlutter His sotalol was had been increased at a recent hospital stay though for unclear reasons discharged on 31m BID His sotalol was resumed to 825mBID, his VT zone HR was reduced.   He l A. [place]illery, [place]/14/2021, he remained in ,AF had mot missed any OAC doses and planned for DCCV > 10/28/19 successful  Saw Dr. CaCurt Bearsay 2022, fatigued, in AFib, planned for DCCV   He last saw EP by A. Tillery, PA-C, 12/2020, he was doing well, no changes were made.  He saw Dr. TuRadford Paxost recently 02/07/21, also reported as doing well , no changes were made  Device clinic note 02/14/21 reporting VT that occurred 01/28/21, successfully terminated  by ATP, pt was asymptomatic, advised not to drive Dr. 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 Discussed VT rates wobbled about detection rate.  I saw him 06/27/21 He is doing well Gets winded with heavier activities, though since his last visit has intentially increased his activities/exercise and cut out sweets, has lost 6lbs! No CP, no rest symptoms No dizzy spells, near syncope or syncope. No bleeding or signs of bleeding No device shocks  No VT Stable QTc Planned for labs review of his VT from Oct, rate was >142-143bpm with only one or 2 slower R-R intervals, I did not adjust his VT zone, am locked out from reducing this by other parameters  Device clinic note: 11/14/21 ATP therapies noted   TODAY He is doing well. Continues to avoid sweets and getting slow steady weight loss, he feels better, breaths easire No CP, palpitations or cardiac awareness No dizzy spells, near syncope or syncope No rest SOB, no DOE with ADLs, less SOB  with heavier activities then he used to get. He feels like he is in a pretty good place He has been taking the lasix 1/2 tab Tues-Sunday and a full tab on Monday's He had a few days a couple weeks ago with low BP readings after his meds, but this has settled away. No bleeding or signs of bleeding   Device information MDT CRT-D, implanted1/13/17 + hx of numerous appropriate therapies VT   AAD Hx: Amiodarone stopped Jan 2019, 2/2 SOB,  No clear finding  of amio tox >> mexiletine started More VT and sotalol was added Dec 2020   Past Medical History:  Diagnosis Date   Arthritis    "minor everywhere" (04/22/2015)   Asthma    "a touch"   Barrett esophagus    CAD (coronary artery disease)    a. s/p 3 vvessel CABG with LIMA to LAD, SVG to diagonal 1, SVG to RCA 12/09.   Cardiac resynchronization therapy defibrillator (CRT-D) in place    Chronic anticoagulation 01/23/2013   Chronic combined systolic and diastolic CHF (congestive heart failure) (HCC)    dry weight 197-200lbs.   CKD (chronic kidney disease) stage 3, GFR 30-59 ml/min (HCC) 6/38/4536   Complication of anesthesia    DCM (dilated cardiomyopathy) ([place])    initially concerned for TTP amyloidosis but Tc-PYP scan 06/2017 was not consistent with amyloid and SPEP/UPEP with no M spike. EF 20-25% by echo 2019   Diverticulitis 08/2019   Dyslipidemia 01/23/2013   Essential hypertension 01/23/2013   GERD (gastroesophageal reflux disease)    GI bleed 05/08/2017   H/O: rheumatic fever    Hepatitis 1957   "don't know what kind:   History of hiatal hernia    History of PFTs    a. Amiodarone started 5/16 >> PFTs w/ DLCO 6/16:  FEV1 72% predicted, FEV1/FVC 66%, DLCO 66% >> minimal reversible obstructive airways disease with mild diffusion defect (suggestive of emphysema but absence of hyperinflation inconsistent with dx)   Hyperkalemia 08/23/2014   Hyperthyroidism 05/26/2017   Leg weakness 01/28/2017   Multiple thyroid nodules 08/13/2017   Orthostatic hypotension    Persistent atrial fibrillation ([place]) 01/23/2013   Pleural effusion on right 05/26/2017   Pneumonia 08/2014   "dr thought I may have had a touch"   Pre-diabetes    Protein-calorie malnutrition, severe 05/27/2017   S/P AVR (aortic valve replacement) 2009   a. severe AS s/p AVR with pericardial tissue valve 2009.   Shingles 08/23/2014   VT (ventricular tachycardia) 05/06/2017    Past Surgical History:  Procedure Laterality Date    AORTIC VALVE REPLACEMENT  with 23-mm Magna Ease pericardial valve, model number   with 23-mm Magna Ease pericardial valve, model [id number], serial number [id number]    APPENDECTOMY  1940s   BI-VENTRICULAR IMPLANTABLE CARDIOVERTER DEFIBRILLATOR  (CRT-D)  04/22/2015   CARDIAC CATHETERIZATION N/A 11/04/2014   Procedure: Left Heart Cath and Cors/Grafts Angiography;  Surgeon: Leonie Man, MD;  Location: [facility];  Service: Cardiovascular;  Laterality: N/A;   CARDIAC VALVE REPLACEMENT     CARDIOVERSION N/A 01/27/2013   Procedure: CARDIOVERSION;  Surgeon: Sueanne Margarita, MD;  Location: [facility];  Service: Cardiovascular;  Laterality: N/A;   CARDIOVERSION N/A 08/25/2014   Procedure: CARDIOVERSION;  Surgeon: Sanda Klein, MD;  Location: [facility];  Service: Cardiovascular;  Laterality: N/A;   CARDIOVERSION N/A 05/28/2018   Procedure: CARDIOVERSION;  Surgeon: Pixie Casino, MD;  Location: [facility];  Service: Cardiovascular;  Laterality: N/A;  CARDIOVERSION N/A 10/28/2019   Procedure: CARDIOVERSION;  Surgeon: Sueanne Margarita, MD;  Location: [facility];  Service: Cardiovascular;  Laterality: N/A;   CARDIOVERSION N/A 09/01/2020   Procedure: CARDIOVERSION;  Surgeon: Lelon Perla, MD;  Location: [facility]COPY;  Service: Cardiovascular;  Laterality: N/A;   CATARACT EXTRACTION W/ INTRAOCULAR LENS  IMPLANT, BILATERAL Bilateral    CHEST TUBE INSERTION Right 07/26/2017   Procedure: INSERTION PLEURAL DRAINAGE CATHETER - RIGHT;  Surgeon: Ivin Poot, MD;  Location: [place] [facility];  Service: Thoracic;  Laterality: Right;   CORONARY ARTERY BYPASS GRAFT  03/10/2008   x 3 Dr. Roxan Hockey   EP IMPLANTABLE DEVICE N/A 04/22/2015   Procedure: BiV ICD Insertion CRT-D;  Surgeon: [name], MD;  Location: [facility];  Service: Cardiovascular;  Laterality: N/A;   ESOPHAGOGASTRODUODENOSCOPY (EGD) WITH ESOPHAGEAL DILATION  X1   ESOPHAGOGASTRODUODENOSCOPY (EGD) WITH PROPOFOL N/A  05/09/2017   Procedure: ESOPHAGOGASTRODUODENOSCOPY (EGD) WITH PROPOFOL;  Surgeon: Carol Ada, MD;  Location: [place];  Service: Endoscopy;  Laterality: N/A;   IR THORACENTESIS ASP PLEURAL SPACE W/IMG GUIDE  05/27/2017   IR THORACENTESIS ASP PLEURAL SPACE W/IMG GUIDE  06/17/2017   IR THORACENTESIS ASP PLEURAL SPACE W/IMG GUIDE  06/18/2017   IR THORACENTESIS ASP PLEURAL SPACE W/IMG GUIDE  07/11/2017   REMOVAL OF PLEURAL DRAINAGE CATHETER Right 09/13/2017   Procedure: REMOVAL OF PLEURAL DRAINAGE CATHETER;  Surgeon: Ivin Poot, MD;  Location: [place];  Service: Thoracic;  Laterality: Right;   RIGHT HEART CATH AND CORONARY/GRAFT ANGIOGRAPHY N/A 05/31/2017   Procedure: RIGHT HEART CATH AND CORONARY/GRAFT ANGIOGRAPHY;  Surgeon: Jolaine Artist, MD;  Location: [facility];  Service: Cardiovascular;  Laterality: N/A;   TONSILLECTOMY      Current Outpatient Medications  Medication Sig Dispense Refill   acetaminophen (TYLENOL) 500 MG tablet Take 1,000 mg by mouth every 6 (six) hours as needed for moderate pain or headache.     albuterol (VENTOLIN HFA) 108 (90 Base) MCG/ACT inhaler Inhale 1-2 puffs into the lungs every 6 (six) hours as needed for wheezing or shortness of breath.     atorvastatin (LIPITOR) 40 MG tablet Take 1 tablet (40 mg total) by mouth daily. 90 tablet 3   carvedilol (COREG) 25 MG tablet Take 1 tablet (25 mg total) by mouth 2 (two) times daily with a meal. TAKE 1 TABLET TWICE DAILY (DOSE INCREASE) 180 tablet 3   ELIQUIS 2.5 MG TABS tablet TAKE 1 TABLET TWICE DAILY 180 tablet 1   famotidine (PEPCID) 40 MG tablet Take 40 mg by mouth every evening.      furosemide (LASIX) 40 MG tablet TAKE 1 TABLET EVERY DAY AS NEEDED FOR EDEMA 90 tablet 3   hydrALAZINE (APRESOLINE) 25 MG tablet TAKE 1/2 TABLET BY MOUTH IN THE MORNING AND AT BEDTIME 90 tablet 3   isosorbide mononitrate (IMDUR) 30 MG 24 hr tablet Take 1 tablet (30 mg total) by mouth daily. 90 tablet 3   mexiletine (MEXITIL)  150 MG capsule TAKE 2 CAPSULES BY MOUTH EVERY 12 HOURS. 360 capsule 3   Multiple Vitamin (MULTIVITAMIN WITH MINERALS) TABS tablet Take 1 tablet by mouth daily.      pantoprazole (PROTONIX) 40 MG tablet Take 40 mg by mouth daily.     sotalol (BETAPACE) 120 MG tablet Take 1 tablet (120 mg total) by mouth daily. 90 tablet 1   VASCEPA 1 g capsule Take 2 capsules (2 g total) by mouth 2 (two) times daily. 360 capsule 3  No current facility-administered medications for this visit.    Allergies:   Ranolazine, Novocain [procaine], and Amiodarone   Social History:  The patient  reports that he has never smoked. He has never used smokeless tobacco. He reports that he does not currently use alcohol. He reports that he does not use drugs.   Family History:  The patient's family history includes Alzheimer's disease in his mother and another family member; COPD in his daughter and sister; Depression in his sister; Esophageal cancer in his sister; Heart disease in his sister; Hyperlipidemia in his sister; Hypertension in his sister.  ROS:  Please see the history of present illness.   All other systems are reviewed and otherwise negative.   PHYSICAL EXAM:  VS:  There were no vitals taken for this visit. BMI: There is no height or weight on file to calculate BMI. Well nourished, well developed, in no acute distress  HEENT: normocephalic, atraumatic  Neck: no JVD, carotid bruits or masses Cardiac:  RRR; no significant murmurs, no rubs, or gallops Lungs:  CTA b/l, no wheezing, rhonchi or rales  Abd: soft, nontender, MS: no deformity,  age appropriate atrophy Ext:  no edema  Skin: warm and dry, no rash Neuro:  No gross deficits appreciated Psych: euthymic mood, full affect  ICD site is stable, no tethering or discomfort   EKG:  Done today and reviewed by myself: AFlutter with V pacing/fusing 93bpm, QT is difficult with variable paced durations QT 400-440, QRS 140-167m, QTc looks OK    ICD  interrogation done today and reviewed by myself:   Battery is 833moo ERI Lead measurements are good Current AFlutter episode started today, burden is <0.1% Treated episode is reviewed at length, this is a 1:1 tachycardia and by morphology, is SVT In VT zone, there are no HV therapies here, and by history does have a slower VT N programming changes made   09/07/2019: TTE IMPRESSIONS   1. Left ventricular ejection fraction, by estimation, is 30 to 35%. The  left ventricle has moderately decreased function. Left ventricular  endocardial border not optimally defined to evaluate regional wall motion.  Left ventricular diastolic parameters  are consistent with Grade II diastolic dysfunction (pseudonormalization).  Elevated left ventricular end-diastolic pressure.   2. Right ventricular systolic function is moderately reduced. The right  ventricular size is normal. Tricuspid regurgitation signal is inadequate  for assessing PA pressure.   3. Left atrial size was mildly dilated.   4. The mitral valve is abnormal. Trivial mitral valve regurgitation. No  evidence of mitral stenosis.   5. The aortic valve has been repaired/replaced. Aortic valve  regurgitation is not visualized. There is a 23 mm Magna Ease pericardial  valve present in the aortic position. Procedure Date: 03/10/2008. Echo  findings are consistent with normal structure and   function of the aortic valve prosthesis. Aortic valve mean gradient  measures 6.0 mmHg.    05/31/17; R/LHC Assessment: 1. 3v CAD with stable revascularization with patent LIMA-LAD, SVG-diagonal and SVG-RCA 2. 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 Normal cardiac output - no evidence of thyrotoxicosis heart disease or PH  05/27/17: TTE Study Conclusions - Left ventricle: The cavity size was normal. Wall thickness was   increased in a pattern of mild LVH. Systolic function was   severely reduced. The  estimated ejection fraction was in the   range of 20% to 25%. There is akinesis of the  inferolateral,   inferior, and inferoseptal myocardium. Features are consistent   with a pseudonormal left ventricular filling pattern, with   concomitant abnormal relaxation and increased filling pressure   (grade 2 diastolic dysfunction). - Aortic valve: A pericardial tissue valve bioprosthesis was   present and functioning normally. Peak velocity (S): 226 cm/s.   Mean gradient (S): 10 mm Hg. Valve area (VTI): 1.52 cm^2. Valve   area (Vmax): 1.5 cm^2. Valve area (Vmean): 1.59 cm^2. - Mitral valve: Calcified annulus. Mildly thickened leaflets .   There was mild regurgitation. - Left atrium: The atrium was severely dilated. - Right ventricle: Pacer wire or catheter noted in right ventricle. - Pulmonary arteries: Systolic pressure was mildly increased. PA   peak pressure: 35 mm Hg (S). Impressions: - Compared to the prior study, there has been no significant   interval change.  Recent Labs: 02/07/2021: ALT 18 06/27/2021: BUN 41; Creatinine, Ser 2.27; Hemoglobin 14.5; Magnesium 2.3; Platelets 264; Potassium 4.9; Sodium 137  06/27/2021: Chol/HDL Ratio 2.8; Cholesterol, Total 106; HDL 38; LDL Chol Calc (NIH) 37; Triglycerides 193   CrCl cannot be calculated (Patient's most recent lab result is older than the maximum 21 days allowed.).   Wt Readings from Last 3 Encounters:  06/27/21 200 lb (90.7 kg)  02/24/21 206 lb (93.4 kg)  02/07/21 204 lb 6.4 oz (92.7 kg)     Other studies reviewed: Additional studies/records reviewed today include: summarized above  ASSESSMENT AND PLAN:  1. VT 2. ICD      Intact device function      No programming changes made      On mexiletine and sotalol      QTc looks stable  Labs today   3. CAD     No anginal complaints, with excellent exertional capacity     on BB, statin, no ASA w/OAC     C/w Dr. Radford Pax  4. ICM     On BB, hydralazine, Imdur, and diuretic      No on ACE/ARB 2/2 renal insuff     Exam does not suggest volume OL     Optivol looks great, he actually is likely a little dry  I have asked him to reduce his lasix fto 1/2 tab 31m QOD and follow weights for edema Pending his Creat if any other recs      5. VHD Bioprosthetic AVR     Functioning well by TTE in May 2021     C/w Dr. TRadford Pax 6. Hx of orthostatic hypotension     No symptoms of late, has had some lower BPs     Adjust lasix as noted above      7. Paroxysmal AFib, flutter     CHA2DS2Vasc is 5, on Eliquis, appropriately dosed for age/renal insuff     <0.1% burden      Have asked the device clinic get a transmission in a couple days to evaluate his rhythm        Disposition: will have him back in [place] if needed     Current medicines are reviewed at length with the patient today.  The patient did not have any concerns regarding medicines.  SiVenetia NightPA-C 11/25/2021 7:01 PM     [place] [phone])  [phone])

## 2021-11-28 ENCOUNTER — Encounter: Payer: Self-pay | Admitting: Physician Assistant

## 2021-11-28 ENCOUNTER — Ambulatory Visit (INDEPENDENT_AMBULATORY_CARE_PROVIDER_SITE_OTHER): Payer: Medicare HMO | Admitting: Physician Assistant

## 2021-11-28 VITALS — BP 104/60 | HR 88 | Ht 72.0 in | Wt 192.6 lb

## 2021-11-28 DIAGNOSIS — I2510 Atherosclerotic heart disease of native coronary artery without angina pectoris: Secondary | ICD-10-CM

## 2021-11-28 DIAGNOSIS — I472 Ventricular tachycardia, unspecified: Secondary | ICD-10-CM | POA: Diagnosis not present

## 2021-11-28 DIAGNOSIS — I255 Ischemic cardiomyopathy: Secondary | ICD-10-CM | POA: Diagnosis not present

## 2021-11-28 DIAGNOSIS — I480 Paroxysmal atrial fibrillation: Secondary | ICD-10-CM | POA: Diagnosis not present

## 2021-11-28 DIAGNOSIS — Z95810 Presence of automatic (implantable) cardiac defibrillator: Secondary | ICD-10-CM

## 2021-11-28 LAB — CUP PACEART INCLINIC DEVICE CHECK
Date Time Interrogation Session: 20230822192924
Implantable Lead Implant Date: 20170113
Implantable Lead Implant Date: 20170113
Implantable Lead Implant Date: 20170113
Implantable Lead Location: 753858
Implantable Lead Location: 753859
Implantable Lead Location: 753860
Implantable Lead Model: 4298
Implantable Lead Model: 5076
Implantable Pulse Generator Implant Date: 20170113
Lead Channel Pacing Threshold Amplitude: 0.5 V
Lead Channel Pacing Threshold Amplitude: 0.75 V
Lead Channel Pacing Threshold Pulse Width: 0.4 ms
Lead Channel Pacing Threshold Pulse Width: 0.4 ms
Lead Channel Sensing Intrinsic Amplitude: 1 mV
Lead Channel Sensing Intrinsic Amplitude: 10.9 mV

## 2021-11-28 NOTE — Patient Instructions (Addendum)
Medication Instructions:  Your physician recommends that you continue on your current medications as directed. Please refer to the Current Medication list given to you today.   *If you need a refill on your cardiac medications before your next appointment, please call your pharmacy*   Lab Work: BMET and Mg today  If you have labs (blood work) drawn today and your tests are completely normal, you will receive your results only by: [place] (if you have MyChart) OR A paper copy in the mail If you have any lab test that is abnormal or we need to change your treatment, we will call you to review the results.   Testing/Procedures: None ordered.    Follow-Up: At [facility], you and your health needs are our priority.  As part of our continuing mission to provide you with exceptional heart care, we have created designated Provider Care Teams.  These Care Teams include your primary Cardiologist (physician) and Advanced Practice Providers (APPs -  Physician Assistants and Nurse Practitioners) who all work together to provide you with the care you need, when you need it.  We recommend signing up for the patient portal called "MyChart".  Sign up information is provided on this After Visit Summary.  MyChart is used to connect with patients for Virtual Visits (Telemedicine).  Patients are able to view lab/test results, encounter notes, upcoming appointments, etc.  Non-urgent messages can be sent to your provider as well.   To learn more about what you can do with MyChart, go to [email or web address].    Your next appointment:   3 months with Dr Simonne Maffucci    Other Instructions Please send a device transmission in 2-3 days to ensure you are back in regular rhythm .  Important Information About Sugar

## 2021-11-29 ENCOUNTER — Other Ambulatory Visit: Payer: Self-pay | Admitting: *Deleted

## 2021-11-29 DIAGNOSIS — Z79899 Other long term (current) drug therapy: Secondary | ICD-10-CM

## 2021-11-29 LAB — BASIC METABOLIC PANEL
BUN/Creatinine Ratio: 14 (ref 10–24)
BUN: 27 mg/dL (ref 8–27)
CO2: 22 mmol/L (ref 20–29)
Calcium: 9 mg/dL (ref 8.6–10.2)
Chloride: 96 mmol/L (ref 96–106)
Creatinine, Ser: 1.98 mg/dL — ABNORMAL HIGH (ref 0.76–1.27)
Glucose: 129 mg/dL — ABNORMAL HIGH (ref 70–99)
Potassium: 5.4 mmol/L — ABNORMAL HIGH (ref 3.5–5.2)
Sodium: 134 mmol/L (ref 134–144)
eGFR: 32 mL/min/{1.73_m2} — ABNORMAL LOW (ref >59)

## 2021-11-29 LAB — MAGNESIUM: Magnesium: 2.6 mg/dL — ABNORMAL HIGH (ref 1.6–2.3)

## 2021-12-01 ENCOUNTER — Telehealth: Payer: Self-pay

## 2021-12-01 NOTE — Telephone Encounter (Signed)
I spoke with the patient and he agreed to send a manual transmission.

## 2021-12-01 NOTE — Telephone Encounter (Signed)
-----  Message from Thora Lance, RN sent at 11/28/2021  4:05 PM EDT ----- Regarding: transmission Renee has asked that pt send a transmission in 2-3 days to check rhythm. He was in flutter today.  Thanks,  Rosann Auerbach

## 2021-12-06 ENCOUNTER — Ambulatory Visit: Payer: Medicare HMO | Attending: Physician Assistant

## 2021-12-06 DIAGNOSIS — Z79899 Other long term (current) drug therapy: Secondary | ICD-10-CM | POA: Diagnosis not present

## 2021-12-07 ENCOUNTER — Telehealth: Payer: Self-pay | Admitting: *Deleted

## 2021-12-07 LAB — BASIC METABOLIC PANEL
BUN/Creatinine Ratio: 14 (ref 10–24)
BUN: 31 mg/dL — ABNORMAL HIGH (ref 8–27)
CO2: 21 mmol/L (ref 20–29)
Calcium: 8.6 mg/dL (ref 8.6–10.2)
Chloride: 98 mmol/L (ref 96–106)
Creatinine, Ser: 2.26 mg/dL — ABNORMAL HIGH (ref 0.76–1.27)
Glucose: 108 mg/dL — ABNORMAL HIGH (ref 70–99)
Potassium: 4.9 mmol/L (ref 3.5–5.2)
Sodium: 134 mmol/L (ref 134–144)
eGFR: 27 mL/min/{1.73_m2} — ABNORMAL LOW (ref >59)

## 2021-12-07 NOTE — Telephone Encounter (Signed)
Lvm to call clinic back for results and verily medication.

## 2021-12-07 NOTE — Telephone Encounter (Signed)
Pt is returning call. Requesting call back.

## 2021-12-19 NOTE — Progress Notes (Signed)
Remote ICD transmission.   

## 2022-01-04 DIAGNOSIS — Z23 Encounter for immunization: Secondary | ICD-10-CM | POA: Diagnosis not present

## 2022-01-07 ENCOUNTER — Other Ambulatory Visit: Payer: Self-pay | Admitting: Physician Assistant

## 2022-01-10 ENCOUNTER — Other Ambulatory Visit: Payer: Self-pay | Admitting: Cardiology

## 2022-02-06 DIAGNOSIS — I10 Essential (primary) hypertension: Secondary | ICD-10-CM | POA: Diagnosis not present

## 2022-02-06 DIAGNOSIS — E7800 Pure hypercholesterolemia, unspecified: Secondary | ICD-10-CM | POA: Diagnosis not present

## 2022-02-06 DIAGNOSIS — I509 Heart failure, unspecified: Secondary | ICD-10-CM | POA: Diagnosis not present

## 2022-02-06 DIAGNOSIS — N184 Chronic kidney disease, stage 4 (severe): Secondary | ICD-10-CM | POA: Diagnosis not present

## 2022-02-06 DIAGNOSIS — E1122 Type 2 diabetes mellitus with diabetic chronic kidney disease: Secondary | ICD-10-CM | POA: Diagnosis not present

## 2022-02-13 ENCOUNTER — Ambulatory Visit (INDEPENDENT_AMBULATORY_CARE_PROVIDER_SITE_OTHER): Payer: Medicare HMO

## 2022-02-13 ENCOUNTER — Other Ambulatory Visit: Payer: Self-pay | Admitting: Cardiology

## 2022-02-13 DIAGNOSIS — I255 Ischemic cardiomyopathy: Secondary | ICD-10-CM

## 2022-02-13 DIAGNOSIS — E785 Hyperlipidemia, unspecified: Secondary | ICD-10-CM

## 2022-02-14 ENCOUNTER — Telehealth: Payer: Self-pay

## 2022-02-14 ENCOUNTER — Other Ambulatory Visit: Payer: Self-pay

## 2022-02-14 DIAGNOSIS — N184 Chronic kidney disease, stage 4 (severe): Secondary | ICD-10-CM | POA: Diagnosis not present

## 2022-02-14 DIAGNOSIS — E785 Hyperlipidemia, unspecified: Secondary | ICD-10-CM

## 2022-02-14 LAB — CUP PACEART REMOTE DEVICE CHECK
Battery Remaining Longevity: 4 mo
Battery Voltage: 2.8 V
Brady Statistic RA Percent Paced: 0.65 %
Brady Statistic RV Percent Paced: 94.34 %
Date Time Interrogation Session: 20231107213429
HighPow Impedance: 66 Ohm
Implantable Lead Connection Status: 753985
Implantable Lead Connection Status: 753985
Implantable Lead Connection Status: 753985
Implantable Lead Implant Date: 20170113
Implantable Lead Implant Date: 20170113
Implantable Lead Implant Date: 20170113
Implantable Lead Location: 753858
Implantable Lead Location: 753859
Implantable Lead Location: 753860
Implantable Lead Model: 4298
Implantable Lead Model: 5076
Implantable Pulse Generator Implant Date: 20170113
Lead Channel Impedance Value: 323 Ohm
Lead Channel Impedance Value: 399 Ohm
Lead Channel Impedance Value: 399 Ohm
Lead Channel Impedance Value: 399 Ohm
Lead Channel Impedance Value: 437 Ohm
Lead Channel Impedance Value: 494 Ohm
Lead Channel Impedance Value: 494 Ohm
Lead Channel Impedance Value: 589 Ohm
Lead Channel Impedance Value: 779 Ohm
Lead Channel Impedance Value: 779 Ohm
Lead Channel Impedance Value: 855 Ohm
Lead Channel Impedance Value: 893 Ohm
Lead Channel Impedance Value: 969 Ohm
Lead Channel Pacing Threshold Amplitude: 0.5 V
Lead Channel Pacing Threshold Amplitude: 0.625 V
Lead Channel Pacing Threshold Amplitude: 0.875 V
Lead Channel Pacing Threshold Pulse Width: 0.4 ms
Lead Channel Pacing Threshold Pulse Width: 0.4 ms
Lead Channel Pacing Threshold Pulse Width: 0.4 ms
Lead Channel Sensing Intrinsic Amplitude: 0.625 mV
Lead Channel Sensing Intrinsic Amplitude: 0.625 mV
Lead Channel Sensing Intrinsic Amplitude: 9.875 mV
Lead Channel Sensing Intrinsic Amplitude: 9.875 mV
Lead Channel Setting Pacing Amplitude: 1.75 V
Lead Channel Setting Pacing Amplitude: 1.75 V
Lead Channel Setting Pacing Amplitude: 2 V
Lead Channel Setting Pacing Pulse Width: 0.4 ms
Lead Channel Setting Pacing Pulse Width: 0.4 ms
Lead Channel Setting Sensing Sensitivity: 0.3 mV
Zone Setting Status: 755011

## 2022-02-14 MED ORDER — VASCEPA 1 G PO CAPS: ORAL_CAPSULE | ORAL | 3 refills | Status: DC

## 2022-02-14 NOTE — Telephone Encounter (Signed)
Received the following transmission from [employer]:  Scheduled remote reviewed. Normal device function.   Persistent AF, controlled rates, Eliquis, Sotalol Battery estimated 3moroute to triage Next remote to be determined [place]  Patient's battery is now < 447ms.  I have made patient aware that he is now set up for monthly remote monitoring.  He states that he normally has to send a manual transmission so we reviewed the monthly checks that he will send usKorean the 11th of each month starting in December.   Patient verbalizes understanding.   He has asked that we reschedule his 11/21 appt with Dr. CaCurt Bearsue to a transportation concern.  I will have [place] reach out to him to assist with the change.

## 2022-02-16 ENCOUNTER — Other Ambulatory Visit: Payer: Self-pay

## 2022-02-16 ENCOUNTER — Telehealth: Payer: Self-pay | Admitting: Pharmacist

## 2022-02-16 DIAGNOSIS — E785 Hyperlipidemia, unspecified: Secondary | ICD-10-CM

## 2022-02-16 MED ORDER — VASCEPA 1 G PO CAPS: ORAL_CAPSULE | ORAL | 3 refills | Status: DC

## 2022-02-16 NOTE — Telephone Encounter (Signed)
[facility]ed [facility] about the Vascepa cost. Was not billed under the new [employer] grant information. Vascepa is no charge to the patient and he is aware.  Sandford Craze, PharmD. [facility] PGY-1 02/16/2022 2:44 PM '

## 2022-02-20 DIAGNOSIS — N184 Chronic kidney disease, stage 4 (severe): Secondary | ICD-10-CM | POA: Diagnosis not present

## 2022-02-20 DIAGNOSIS — E872 Acidosis, unspecified: Secondary | ICD-10-CM | POA: Diagnosis not present

## 2022-02-20 DIAGNOSIS — I130 Hypertensive heart and chronic kidney disease with heart failure and stage 1 through stage 4 chronic kidney disease, or unspecified chronic kidney disease: Secondary | ICD-10-CM | POA: Diagnosis not present

## 2022-02-20 DIAGNOSIS — E1122 Type 2 diabetes mellitus with diabetic chronic kidney disease: Secondary | ICD-10-CM | POA: Diagnosis not present

## 2022-02-20 DIAGNOSIS — I5022 Chronic systolic (congestive) heart failure: Secondary | ICD-10-CM | POA: Diagnosis not present

## 2022-02-20 DIAGNOSIS — E875 Hyperkalemia: Secondary | ICD-10-CM | POA: Diagnosis not present

## 2022-02-20 DIAGNOSIS — K2270 Barrett's esophagus without dysplasia: Secondary | ICD-10-CM | POA: Diagnosis not present

## 2022-02-26 DIAGNOSIS — K5792 Diverticulitis of intestine, part unspecified, without perforation or abscess without bleeding: Secondary | ICD-10-CM | POA: Diagnosis not present

## 2022-02-26 DIAGNOSIS — R109 Unspecified abdominal pain: Secondary | ICD-10-CM | POA: Diagnosis not present

## 2022-03-07 ENCOUNTER — Encounter: Payer: Medicare HMO | Admitting: Cardiology

## 2022-03-12 NOTE — Progress Notes (Signed)
Remote ICD transmission.   

## 2022-03-19 ENCOUNTER — Encounter: Payer: Self-pay | Admitting: Cardiology

## 2022-03-19 ENCOUNTER — Ambulatory Visit: Payer: Medicare HMO | Attending: Cardiology | Admitting: Cardiology

## 2022-03-19 ENCOUNTER — Ambulatory Visit (INDEPENDENT_AMBULATORY_CARE_PROVIDER_SITE_OTHER): Payer: Medicare HMO

## 2022-03-19 ENCOUNTER — Encounter: Payer: Self-pay | Admitting: *Deleted

## 2022-03-19 VITALS — BP 134/84 | HR 85 | Ht 72.0 in | Wt 195.4 lb

## 2022-03-19 DIAGNOSIS — I4819 Other persistent atrial fibrillation: Secondary | ICD-10-CM | POA: Diagnosis not present

## 2022-03-19 DIAGNOSIS — I255 Ischemic cardiomyopathy: Secondary | ICD-10-CM

## 2022-03-19 DIAGNOSIS — I5022 Chronic systolic (congestive) heart failure: Secondary | ICD-10-CM | POA: Diagnosis not present

## 2022-03-19 DIAGNOSIS — D6869 Other thrombophilia: Secondary | ICD-10-CM | POA: Diagnosis not present

## 2022-03-19 DIAGNOSIS — Z79899 Other long term (current) drug therapy: Secondary | ICD-10-CM

## 2022-03-19 DIAGNOSIS — I480 Paroxysmal atrial fibrillation: Secondary | ICD-10-CM

## 2022-03-19 NOTE — Progress Notes (Unsigned)
Electrophysiology Office Note   Date:  03/20/2022   ID:  ROCHESTER SERPE, DOB 1932-08-08, MRN [id number]  PCP:  [name], MD  Cardiologist:  [name] Primary Electrophysiologist: Khyren Hing Meredith Leeds, MD    No chief complaint on file.    History of Present Illness: [name] is a 88 y.o. male who presents today for electrophysiology evaluation.     He has a history significant for hypertension, aortic stenosis post AVR, chronic systolic heart failure due to ischemic cardiomyopathy, persistent atrial fibrillation/flutter, coronary artery disease post CABG.  He has past Medtronic CRT-D with an upgrade 04/22/2015.  He is on sotalol and mexiletine for ventricular tachycardia.  Today, denies symptoms of palpitations, chest pain, shortness of breath, orthopnea, PND, lower extremity edema, claudication, dizziness, presyncope, syncope, bleeding, or neurologic sequela. The patient is tolerating medications without difficulties.  He presents to clinic today feeling somewhat weak and fatigued.  He is in atrial fibrillation.  He has been in atrial fibrillation for the last 2 months.  He states that he is able to mostly do all of his daily activities, but his atrial fibrillation is limiting.   Past Medical History:  Diagnosis Date   Arthritis    "minor everywhere" (04/22/2015)   Asthma    "a touch"   Barrett esophagus    CAD (coronary artery disease)    a. s/p 3 vvessel CABG with LIMA to LAD, SVG to diagonal 1, SVG to RCA 12/09.   Cardiac resynchronization therapy defibrillator (CRT-D) in place    Chronic anticoagulation 01/23/2013   Chronic combined systolic and diastolic CHF (congestive heart failure) (HCC)    dry weight 197-200lbs.   CKD (chronic kidney disease) stage 3, GFR 30-59 ml/min (HCC) 08/12/3974   Complication of anesthesia    DCM (dilated cardiomyopathy) ([place])    initially concerned for TTP amyloidosis but Tc-PYP scan 06/2017 was not consistent with amyloid and  SPEP/UPEP with no M spike. EF 20-25% by echo 2019   Diverticulitis 08/2019   Dyslipidemia 01/23/2013   Essential hypertension 01/23/2013   GERD (gastroesophageal reflux disease)    GI bleed 05/08/2017   H/O: rheumatic fever    Hepatitis 1957   "don't know what kind:   History of hiatal hernia    History of PFTs    a. Amiodarone started 5/16 >> PFTs w/ DLCO 6/16:  FEV1 72% predicted, FEV1/FVC 66%, DLCO 66% >> minimal reversible obstructive airways disease with mild diffusion defect (suggestive of emphysema but absence of hyperinflation inconsistent with dx)   Hyperkalemia 08/23/2014   Hyperthyroidism 05/26/2017   Leg weakness 01/28/2017   Multiple thyroid nodules 08/13/2017   Orthostatic hypotension    Persistent atrial fibrillation ([place]) 01/23/2013   Pleural effusion on right 05/26/2017   Pneumonia 08/2014   "dr thought I may have had a touch"   Pre-diabetes    Protein-calorie malnutrition, severe 05/27/2017   S/P AVR (aortic valve replacement) 2009   a. severe AS s/p AVR with pericardial tissue valve 2009.   Shingles 08/23/2014   VT (ventricular tachycardia) ([place]) 05/06/2017   Past Surgical History:  Procedure Laterality Date   AORTIC VALVE REPLACEMENT  with 23-mm Magna Ease pericardial valve, model number   with 23-mm Magna Ease pericardial valve, model [id number], serial number [id number]    APPENDECTOMY  1940s   BI-VENTRICULAR IMPLANTABLE CARDIOVERTER DEFIBRILLATOR  (CRT-D)  04/22/2015   CARDIAC CATHETERIZATION N/A 11/04/2014   Procedure: Left Heart Cath and Cors/Grafts Angiography;  Surgeon: Leonie Man, MD;  Location: [place] [facility];  Service: Cardiovascular;  Laterality: N/A;   CARDIAC VALVE REPLACEMENT     CARDIOVERSION N/A 01/27/2013   Procedure: CARDIOVERSION;  Surgeon: Sueanne Margarita, MD;  Location: [place];  Service: Cardiovascular;  Laterality: N/A;   CARDIOVERSION N/A 08/25/2014   Procedure: CARDIOVERSION;  Surgeon: Sanda Klein, MD;  Location: [place];   Service: Cardiovascular;  Laterality: N/A;   CARDIOVERSION N/A 05/28/2018   Procedure: CARDIOVERSION;  Surgeon: Pixie Casino, MD;  Location: [facility] ENDOSCOPY;  Service: Cardiovascular;  Laterality: N/A;   CARDIOVERSION N/A 10/28/2019   Procedure: CARDIOVERSION;  Surgeon: Sueanne Margarita, MD;  Location: [facility] ENDOSCOPY;  Service: Cardiovascular;  Laterality: N/A;   CARDIOVERSION N/A 09/01/2020   Procedure: CARDIOVERSION;  Surgeon: Lelon Perla, MD;  Location: [facility] ENDOSCOPY;  Service: Cardiovascular;  Laterality: N/A;   CATARACT EXTRACTION W/ INTRAOCULAR LENS  IMPLANT, BILATERAL Bilateral    CHEST TUBE INSERTION Right 07/26/2017   Procedure: INSERTION PLEURAL DRAINAGE CATHETER - RIGHT;  Surgeon: Ivin Poot, MD;  Location: [place];  Service: Thoracic;  Laterality: Right;   CORONARY ARTERY BYPASS GRAFT  03/10/2008   x 3 Dr. Roxan Hockey   EP IMPLANTABLE DEVICE N/A 04/22/2015   Procedure: BiV ICD Insertion CRT-D;  Surgeon: Graciela Plato Meredith Leeds, MD;  Location: [place] [facility];  Service: Cardiovascular;  Laterality: N/A;   ESOPHAGOGASTRODUODENOSCOPY (EGD) WITH ESOPHAGEAL DILATION  X1   ESOPHAGOGASTRODUODENOSCOPY (EGD) WITH PROPOFOL N/A 05/09/2017   Procedure: ESOPHAGOGASTRODUODENOSCOPY (EGD) WITH PROPOFOL;  Surgeon: Carol Ada, MD;  Location: [place];  Service: Endoscopy;  Laterality: N/A;   IR THORACENTESIS ASP PLEURAL SPACE W/IMG GUIDE  05/27/2017   IR THORACENTESIS ASP PLEURAL SPACE W/IMG GUIDE  06/17/2017   IR THORACENTESIS ASP PLEURAL SPACE W/IMG GUIDE  06/18/2017   IR THORACENTESIS ASP PLEURAL SPACE W/IMG GUIDE  07/11/2017   REMOVAL OF PLEURAL DRAINAGE CATHETER Right 09/13/2017   Procedure: REMOVAL OF PLEURAL DRAINAGE CATHETER;  Surgeon: Ivin Poot, MD;  Location: [place];  Service: Thoracic;  Laterality: Right;   RIGHT HEART CATH AND CORONARY/GRAFT ANGIOGRAPHY N/A 05/31/2017   Procedure: RIGHT HEART CATH AND CORONARY/GRAFT ANGIOGRAPHY;  Surgeon: Jolaine Artist, MD;  Location: [place] [facility];  Service: Cardiovascular;  Laterality: N/A;   TONSILLECTOMY       Current Outpatient Medications  Medication Sig Dispense Refill   acetaminophen (TYLENOL) 500 MG tablet Take 1,000 mg by mouth every 6 (six) hours as needed for moderate pain or headache.     albuterol (VENTOLIN HFA) 108 (90 Base) MCG/ACT inhaler Inhale 1-2 puffs into the lungs every 6 (six) hours as needed for wheezing or shortness of breath.     atorvastatin (LIPITOR) 40 MG tablet Take 1 tablet (40 mg total) by mouth daily. 90 tablet 3   carvedilol (COREG) 25 MG tablet Take 1 tablet (25 mg total) by mouth 2 (two) times daily with a meal. ( DOSE INCREASE) Please keep November appointment for future refills. Thank you. 180 tablet 0   ELIQUIS 2.5 MG TABS tablet TAKE 1 TABLET TWICE DAILY 180 tablet 1   famotidine (PEPCID) 40 MG tablet Take 40 mg by mouth every evening.      furosemide (LASIX) 40 MG tablet TAKE 1 TABLET EVERY DAY AS NEEDED FOR EDEMA 90 tablet 3   hydrALAZINE (APRESOLINE) 25 MG tablet TAKE 1/2 TABLET BY MOUTH IN THE MORNING AND AT BEDTIME 90 tablet 3   isosorbide mononitrate (IMDUR) 30 MG 24 hr tablet Take 1 tablet (  30 mg total) by mouth daily. Please keep November appointment for future refills. Thank you. 90 tablet 0   mexiletine (MEXITIL) 150 MG capsule TAKE 2 CAPSULES BY MOUTH EVERY 12 HOURS. 360 capsule 3   Multiple Vitamin (MULTIVITAMIN WITH MINERALS) TABS tablet Take 1 tablet by mouth daily.      pantoprazole (PROTONIX) 40 MG tablet Take 40 mg by mouth daily.     sotalol (BETAPACE) 120 MG tablet TAKE 1 TABLET EVERY DAY 90 tablet 1   VASCEPA 1 g capsule TAKE 2 CAPSULES BY MOUTH 2 TIMES DAILY. 360 capsule 3   No current facility-administered medications for this visit.    Allergies:   Ranolazine, Procaine, and Amiodarone   Social History:  The patient  reports that he has never smoked. He has never used smokeless tobacco. He reports that he does not currently use alcohol. He reports that he  does not use drugs.   Family History:  The patient's family history includes Alzheimer's disease in his mother and another family member; COPD in his daughter and sister; Depression in his sister; Esophageal cancer in his sister; Heart disease in his sister; Hyperlipidemia in his sister; Hypertension in his sister.   ROS:  Please see the history of present illness.   Otherwise, review of systems is positive for none.   All other systems are reviewed and negative.   PHYSICAL EXAM: VS:  BP 134/84   Pulse 85   Ht 6' (1.829 m)   Wt 195 lb 6.4 oz (88.6 kg)   SpO2 99%   BMI 26.50 kg/m  , BMI Body mass index is 26.5 kg/m. GEN: Well nourished, well developed, in no acute distress  HEENT: normal  Neck: no JVD, carotid bruits, or masses Cardiac: irregular; no murmurs, rubs, or gallops,no edema  Respiratory:  clear to auscultation bilaterally, normal work of breathing GI: soft, nontender, nondistended, + BS MS: no deformity or atrophy  Skin: warm and dry, device site well healed Neuro:  Strength and sensation are intact Psych: euthymic mood, full affect  EKG:  EKG is ordered today. Personal review of the ekg ordered shows atrial fibrillation, ventricular paced, intermittent fusion beats  Personal review of the device interrogation today. Results in [facility]: 06/27/2021: Hemoglobin 14.5; Platelets 264 11/28/2021: Magnesium 2.6 12/06/2021: BUN 31; Creatinine, Ser 2.26; Potassium 4.9; Sodium 134    Lipid Panel     Component Value Date/Time   CHOL 106 06/27/2021 1409   CHOL 93 (L) 06/16/2014 0914   TRIG 193 (H) 06/27/2021 1409   TRIG 124 06/16/2014 0914   HDL 38 (L) 06/27/2021 1409   HDL 34 (L) 06/16/2014 0914   CHOLHDL 2.8 06/27/2021 1409   CHOLHDL 2.7 01/19/2015 1005   VLDL 32 (H) 01/19/2015 1005   LDLCALC 37 06/27/2021 1409   LDLCALC 34 06/16/2014 0914     Wt Readings from Last 3 Encounters:  03/19/22 195 lb 6.4 oz (88.6 kg)  11/28/21 192 lb 9.6 oz (87.4 kg)   06/27/21 200 lb (90.7 kg)      Other studies Reviewed: Additional studies/ records that were reviewed today include: TTE 05/29/17 Review of the above records today demonstrates:  - Left ventricle: The cavity size was normal. Wall thickness was   increased in a pattern of mild LVH. Systolic function was   severely reduced. The estimated ejection fraction was in the   range of 20% to 25%. There is akinesis of the inferolateral,   inferior, and inferoseptal  myocardium. Features are consistent   with a pseudonormal left ventricular filling pattern, with   concomitant abnormal relaxation and increased filling pressure   (grade 2 diastolic dysfunction). - Aortic valve: A pericardial tissue valve bioprosthesis was   present and functioning normally. Peak velocity (S): 226 cm/s.   Mean gradient (S): 10 mm Hg. Valve area (VTI): 1.52 cm^2. Valve   area (Vmax): 1.5 cm^2. Valve area (Vmean): 1.59 cm^2. - Mitral valve: Calcified annulus. Mildly thickened leaflets .   There was mild regurgitation. - Left atrium: The atrium was severely dilated. - Right ventricle: Pacer wire or catheter noted in right ventricle. - Pulmonary arteries: Systolic pressure was mildly increased. PA   peak pressure: 35 mm Hg (S).  RHC/LHC 05/31/17 Prox LAD to Mid LAD lesion is 50% stenosed. Mid LAD to Dist LAD lesion is 25% stenosed. 1st Mrg lesion is 50% stenosed. Prox RCA to Dist RCA lesion is 100% stenosed. And is large. Prox Graft to Mid Graft lesion is 30% stenosed. And is large. The flow in the graft is reversed. There is competitive flow. And is large. There is competitive flow. Ost 1st Diag to 1st Diag lesion is 90% stenosed.   Findings:   Ao = 145/67 (99)  RA = 6 RV = 70/9 PA = 69/30 (46) PCW = 26 v = 37 Fick cardiac output/index =4.7/2.3 PVR = 4.3 FA sat = 98% PA sat = 63%, 60%   Assessment:   1. 3v CAD with stable revascularization with patent LIMA-LAD, SVG-diagonal and SVG-RCA 2. Moderate  PH due to mixture of pulmonary venous and pulmonary arterial HTN (WHO group 2 & 3) 3. Normal cardiac output - no evidence of thyrotoxicosis heart disease or PH   ASSESSMENT AND PLAN:  1.  Chronic systolic heart failure: Due to ischemic cardiomyopathy.  Status post Medtronic CRT-D implanted 04/22/2015.  Currently on carvedilol 25 mg twice daily, hydralazine 12.5 mg twice daily, Imdur 30 mg daily.  Device functioning appropriately.  No changes.  2.  Persistent atrial fibrillation/flutter: Currently on Eliquis.  CHA2DS2-VASc of 5.  Unfortunately he is in atrial fibrillation today.  He feels weak and fatigued.  He would prefer a rhythm control strategy.  Chassie Pennix plan for cardioversion.  3.  Ventricular tachycardia: Currently on sotalol 120 mg daily, mexiletine 300 mg twice daily.  Unfortunately has no other medical options and due to comorbidities.  No further episodes.  4.  Coronary artery disease: Status post CABG.  No current chest pain.  5.  Aortic stenosis: Post AVR.  Plan per primary cardiology  6.  High risk medication monitoring: Currently on mexiletine and sotalol as above.  QTc has remained stable.  Shreya Lacasse check potassium and magnesium today.  Current medicines are reviewed at length with the patient today.   The patient does not have concerns regarding his medicines.  The following changes were made today: None  Labs/ tests ordered today include:  Orders Placed This Encounter  Procedures   EKG 12-Lead    Disposition:   FU 6 follow-up cardioversion months  Signed, all of Lashaun Poch Meredith Leeds, MD  03/20/2022 8:02 AM     [address] [place] [address] [phone] (office) [phone] (fax)

## 2022-03-19 NOTE — Patient Instructions (Signed)
Medication Instructions:  Your physician recommends that you continue on your current medications as directed. Please refer to the Current Medication list given to you today.  *If you need a refill on your cardiac medications before your next appointment, please call your pharmacy*   Lab Work: None ordered   Testing/Procedures: Your physician has recommended that you have a Cardioversion (DCCV). Electrical Cardioversion uses a jolt of electricity to your heart either through paddles or wired patches attached to your chest. This is a controlled, usually prescheduled, procedure. Defibrillation is done under light anesthesia in the hospital, and you usually go home the day of the procedure. This is done to get your heart back into a normal rhythm. You are not awake for the procedure. Please see the instruction sheet given to you today.    Follow-Up: At [facility], you and your health needs are our priority.  As part of our continuing mission to provide you with exceptional heart care, we have created designated Provider Care Teams.  These Care Teams include your primary Cardiologist (physician) and Advanced Practice Providers (APPs -  Physician Assistants and Nurse Practitioners) who all work together to provide you with the care you need, when you need it.   Your next appointment:   2 week(s) after your cardioversion  The format for your next appointment:   In Person  Provider:   You will follow up in the [facility] located at [facility]. Your provider will be: Roderic Palau, NP or Clint R. Fenton, PA-C    Thank you for choosing [facility]!!   Trinidad Curet, RN [phone]  Other Instructions    Important Information About Sugar

## 2022-03-19 NOTE — H&P (View-Only) (Signed)
Electrophysiology Office Note   Date:  03/20/2022   ID:  ROCHESTER SERPE, DOB 1932-08-08, MRN [id number]  PCP:  [name], MD  Cardiologist:  [name] Primary Electrophysiologist: Keidan Aumiller Meredith Leeds, MD    No chief complaint on file.    History of Present Illness: [name] is a 88 y.o. male who presents today for electrophysiology evaluation.     He has a history significant for hypertension, aortic stenosis post AVR, chronic systolic heart failure due to ischemic cardiomyopathy, persistent atrial fibrillation/flutter, coronary artery disease post CABG.  He has past Medtronic CRT-D with an upgrade 04/22/2015.  He is on sotalol and mexiletine for ventricular tachycardia.  Today, denies symptoms of palpitations, chest pain, shortness of breath, orthopnea, PND, lower extremity edema, claudication, dizziness, presyncope, syncope, bleeding, or neurologic sequela. The patient is tolerating medications without difficulties.  He presents to clinic today feeling somewhat weak and fatigued.  He is in atrial fibrillation.  He has been in atrial fibrillation for the last 2 months.  He states that he is able to mostly do all of his daily activities, but his atrial fibrillation is limiting.   Past Medical History:  Diagnosis Date   Arthritis    "minor everywhere" (04/22/2015)   Asthma    "a touch"   Barrett esophagus    CAD (coronary artery disease)    a. s/p 3 vvessel CABG with LIMA to LAD, SVG to diagonal 1, SVG to RCA 12/09.   Cardiac resynchronization therapy defibrillator (CRT-D) in place    Chronic anticoagulation 01/23/2013   Chronic combined systolic and diastolic CHF (congestive heart failure) (HCC)    dry weight 197-200lbs.   CKD (chronic kidney disease) stage 3, GFR 30-59 ml/min (HCC) 08/12/3974   Complication of anesthesia    DCM (dilated cardiomyopathy) ([place])    initially concerned for TTP amyloidosis but Tc-PYP scan 06/2017 was not consistent with amyloid and  SPEP/UPEP with no M spike. EF 20-25% by echo 2019   Diverticulitis 08/2019   Dyslipidemia 01/23/2013   Essential hypertension 01/23/2013   GERD (gastroesophageal reflux disease)    GI bleed 05/08/2017   H/O: rheumatic fever    Hepatitis 1957   "don't know what kind:   History of hiatal hernia    History of PFTs    a. Amiodarone started 5/16 >> PFTs w/ DLCO 6/16:  FEV1 72% predicted, FEV1/FVC 66%, DLCO 66% >> minimal reversible obstructive airways disease with mild diffusion defect (suggestive of emphysema but absence of hyperinflation inconsistent with dx)   Hyperkalemia 08/23/2014   Hyperthyroidism 05/26/2017   Leg weakness 01/28/2017   Multiple thyroid nodules 08/13/2017   Orthostatic hypotension    Persistent atrial fibrillation ([place]) 01/23/2013   Pleural effusion on right 05/26/2017   Pneumonia 08/2014   "dr thought I may have had a touch"   Pre-diabetes    Protein-calorie malnutrition, severe 05/27/2017   S/P AVR (aortic valve replacement) 2009   a. severe AS s/p AVR with pericardial tissue valve 2009.   Shingles 08/23/2014   VT (ventricular tachycardia) ([place]) 05/06/2017   Past Surgical History:  Procedure Laterality Date   AORTIC VALVE REPLACEMENT  with 23-mm Magna Ease pericardial valve, model number   with 23-mm Magna Ease pericardial valve, model [id number], serial number [id number]    APPENDECTOMY  1940s   BI-VENTRICULAR IMPLANTABLE CARDIOVERTER DEFIBRILLATOR  (CRT-D)  04/22/2015   CARDIAC CATHETERIZATION N/A 11/04/2014   Procedure: Left Heart Cath and Cors/Grafts Angiography;  Surgeon: Leonie Man, MD;  Location: [facility];  Service: Cardiovascular;  Laterality: N/A;   CARDIAC VALVE REPLACEMENT     CARDIOVERSION N/A 01/27/2013   Procedure: CARDIOVERSION;  Surgeon: Sueanne Margarita, MD;  Location: [facility];  Service: Cardiovascular;  Laterality: N/A;   CARDIOVERSION N/A 08/25/2014   Procedure: CARDIOVERSION;  Surgeon: Sanda Klein, MD;  Location: [facility];   Service: Cardiovascular;  Laterality: N/A;   CARDIOVERSION N/A 05/28/2018   Procedure: CARDIOVERSION;  Surgeon: Pixie Casino, MD;  Location: [facility] ENDOSCOPY;  Service: Cardiovascular;  Laterality: N/A;   CARDIOVERSION N/A 10/28/2019   Procedure: CARDIOVERSION;  Surgeon: Sueanne Margarita, MD;  Location: [facility] ENDOSCOPY;  Service: Cardiovascular;  Laterality: N/A;   CARDIOVERSION N/A 09/01/2020   Procedure: CARDIOVERSION;  Surgeon: Lelon Perla, MD;  Location: [facility] ENDOSCOPY;  Service: Cardiovascular;  Laterality: N/A;   CATARACT EXTRACTION W/ INTRAOCULAR LENS  IMPLANT, BILATERAL Bilateral    CHEST TUBE INSERTION Right 07/26/2017   Procedure: INSERTION PLEURAL DRAINAGE CATHETER - RIGHT;  Surgeon: Ivin Poot, MD;  Location: [facility];  Service: Thoracic;  Laterality: Right;   CORONARY ARTERY BYPASS GRAFT  03/10/2008   x 3 Dr. Roxan Hockey   EP IMPLANTABLE DEVICE N/A 04/22/2015   Procedure: BiV ICD Insertion CRT-D;  Surgeon: Yun Gutierrez Meredith Leeds, MD;  Location: [place];  Service: Cardiovascular;  Laterality: N/A;   ESOPHAGOGASTRODUODENOSCOPY (EGD) WITH ESOPHAGEAL DILATION  X1   ESOPHAGOGASTRODUODENOSCOPY (EGD) WITH PROPOFOL N/A 05/09/2017   Procedure: ESOPHAGOGASTRODUODENOSCOPY (EGD) WITH PROPOFOL;  Surgeon: Carol Ada, MD;  Location: [facility];  Service: Endoscopy;  Laterality: N/A;   IR THORACENTESIS ASP PLEURAL SPACE W/IMG GUIDE  05/27/2017   IR THORACENTESIS ASP PLEURAL SPACE W/IMG GUIDE  06/17/2017   IR THORACENTESIS ASP PLEURAL SPACE W/IMG GUIDE  06/18/2017   IR THORACENTESIS ASP PLEURAL SPACE W/IMG GUIDE  07/11/2017   REMOVAL OF PLEURAL DRAINAGE CATHETER Right 09/13/2017   Procedure: REMOVAL OF PLEURAL DRAINAGE CATHETER;  Surgeon: Ivin Poot, MD;  Location: [facility];  Service: Thoracic;  Laterality: Right;   RIGHT HEART CATH AND CORONARY/GRAFT ANGIOGRAPHY N/A 05/31/2017   Procedure: RIGHT HEART CATH AND CORONARY/GRAFT ANGIOGRAPHY;  Surgeon: Jolaine Artist, MD;  Location: [facility];  Service: Cardiovascular;  Laterality: N/A;   TONSILLECTOMY       Current Outpatient Medications  Medication Sig Dispense Refill   acetaminophen (TYLENOL) 500 MG tablet Take 1,000 mg by mouth every 6 (six) hours as needed for moderate pain or headache.     albuterol (VENTOLIN HFA) 108 (90 Base) MCG/ACT inhaler Inhale 1-2 puffs into the lungs every 6 (six) hours as needed for wheezing or shortness of breath.     atorvastatin (LIPITOR) 40 MG tablet Take 1 tablet (40 mg total) by mouth daily. 90 tablet 3   carvedilol (COREG) 25 MG tablet Take 1 tablet (25 mg total) by mouth 2 (two) times daily with a meal. ( DOSE INCREASE) Please keep November appointment for future refills. Thank you. 180 tablet 0   ELIQUIS 2.5 MG TABS tablet TAKE 1 TABLET TWICE DAILY 180 tablet 1   famotidine (PEPCID) 40 MG tablet Take 40 mg by mouth every evening.      furosemide (LASIX) 40 MG tablet TAKE 1 TABLET EVERY DAY AS NEEDED FOR EDEMA 90 tablet 3   hydrALAZINE (APRESOLINE) 25 MG tablet TAKE 1/2 TABLET BY MOUTH IN THE MORNING AND AT BEDTIME 90 tablet 3   isosorbide mononitrate (IMDUR) 30 MG 24 hr tablet Take 1 tablet (  30 mg total) by mouth daily. Please keep November appointment for future refills. Thank you. 90 tablet 0   mexiletine (MEXITIL) 150 MG capsule TAKE 2 CAPSULES BY MOUTH EVERY 12 HOURS. 360 capsule 3   Multiple Vitamin (MULTIVITAMIN WITH MINERALS) TABS tablet Take 1 tablet by mouth daily.      pantoprazole (PROTONIX) 40 MG tablet Take 40 mg by mouth daily.     sotalol (BETAPACE) 120 MG tablet TAKE 1 TABLET EVERY DAY 90 tablet 1   VASCEPA 1 g capsule TAKE 2 CAPSULES BY MOUTH 2 TIMES DAILY. 360 capsule 3   No current facility-administered medications for this visit.    Allergies:   Ranolazine, Procaine, and Amiodarone   Social History:  The patient  reports that he has never smoked. He has never used smokeless tobacco. He reports that he does not currently use alcohol. He reports that he  does not use drugs.   Family History:  The patient's family history includes Alzheimer's disease in his mother and another family member; COPD in his daughter and sister; Depression in his sister; Esophageal cancer in his sister; Heart disease in his sister; Hyperlipidemia in his sister; Hypertension in his sister.   ROS:  Please see the history of present illness.   Otherwise, review of systems is positive for none.   All other systems are reviewed and negative.   PHYSICAL EXAM: VS:  BP 134/84   Pulse 85   Ht 6' (1.829 m)   Wt 195 lb 6.4 oz (88.6 kg)   SpO2 99%   BMI 26.50 kg/m  , BMI Body mass index is 26.5 kg/m. GEN: Well nourished, well developed, in no acute distress  HEENT: normal  Neck: no JVD, carotid bruits, or masses Cardiac: irregular; no murmurs, rubs, or gallops,no edema  Respiratory:  clear to auscultation bilaterally, normal work of breathing GI: soft, nontender, nondistended, + BS MS: no deformity or atrophy  Skin: warm and dry, device site well healed Neuro:  Strength and sensation are intact Psych: euthymic mood, full affect  EKG:  EKG is ordered today. Personal review of the ekg ordered shows atrial fibrillation, ventricular paced, intermittent fusion beats  Personal review of the device interrogation today. Results in [facility]: 06/27/2021: Hemoglobin 14.5; Platelets 264 11/28/2021: Magnesium 2.6 12/06/2021: BUN 31; Creatinine, Ser 2.26; Potassium 4.9; Sodium 134    Lipid Panel     Component Value Date/Time   CHOL 106 06/27/2021 1409   CHOL 93 (L) 06/16/2014 0914   TRIG 193 (H) 06/27/2021 1409   TRIG 124 06/16/2014 0914   HDL 38 (L) 06/27/2021 1409   HDL 34 (L) 06/16/2014 0914   CHOLHDL 2.8 06/27/2021 1409   CHOLHDL 2.7 01/19/2015 1005   VLDL 32 (H) 01/19/2015 1005   LDLCALC 37 06/27/2021 1409   LDLCALC 34 06/16/2014 0914     Wt Readings from Last 3 Encounters:  03/19/22 195 lb 6.4 oz (88.6 kg)  11/28/21 192 lb 9.6 oz (87.4 kg)   06/27/21 200 lb (90.7 kg)      Other studies Reviewed: Additional studies/ records that were reviewed today include: TTE 05/29/17 Review of the above records today demonstrates:  - Left ventricle: The cavity size was normal. Wall thickness was   increased in a pattern of mild LVH. Systolic function was   severely reduced. The estimated ejection fraction was in the   range of 20% to 25%. There is akinesis of the inferolateral,   inferior, and inferoseptal  myocardium. Features are consistent   with a pseudonormal left ventricular filling pattern, with   concomitant abnormal relaxation and increased filling pressure   (grade 2 diastolic dysfunction). - Aortic valve: A pericardial tissue valve bioprosthesis was   present and functioning normally. Peak velocity (S): 226 cm/s.   Mean gradient (S): 10 mm Hg. Valve area (VTI): 1.52 cm^2. Valve   area (Vmax): 1.5 cm^2. Valve area (Vmean): 1.59 cm^2. - Mitral valve: Calcified annulus. Mildly thickened leaflets .   There was mild regurgitation. - Left atrium: The atrium was severely dilated. - Right ventricle: Pacer wire or catheter noted in right ventricle. - Pulmonary arteries: Systolic pressure was mildly increased. PA   peak pressure: 35 mm Hg (S).  RHC/LHC 05/31/17 Prox LAD to Mid LAD lesion is 50% stenosed. Mid LAD to Dist LAD lesion is 25% stenosed. 1st Mrg lesion is 50% stenosed. Prox RCA to Dist RCA lesion is 100% stenosed. And is large. Prox Graft to Mid Graft lesion is 30% stenosed. And is large. The flow in the graft is reversed. There is competitive flow. And is large. There is competitive flow. Ost 1st Diag to 1st Diag lesion is 90% stenosed.   Findings:   Ao = 145/67 (99)  RA = 6 RV = 70/9 PA = 69/30 (46) PCW = 26 v = 37 Fick cardiac output/index =4.7/2.3 PVR = 4.3 FA sat = 98% PA sat = 63%, 60%   Assessment:   1. 3v CAD with stable revascularization with patent LIMA-LAD, SVG-diagonal and SVG-RCA 2. Moderate  PH due to mixture of pulmonary venous and pulmonary arterial HTN (WHO group 2 & 3) 3. Normal cardiac output - no evidence of thyrotoxicosis heart disease or PH   ASSESSMENT AND PLAN:  1.  Chronic systolic heart failure: Due to ischemic cardiomyopathy.  Status post Medtronic CRT-D implanted 04/22/2015.  Currently on carvedilol 25 mg twice daily, hydralazine 12.5 mg twice daily, Imdur 30 mg daily.  Device functioning appropriately.  No changes.  2.  Persistent atrial fibrillation/flutter: Currently on Eliquis.  CHA2DS2-VASc of 5.  Unfortunately he is in atrial fibrillation today.  He feels weak and fatigued.  He would prefer a rhythm control strategy.  [place] plan for cardioversion.  3.  Ventricular tachycardia: Currently on sotalol 120 mg daily, mexiletine 300 mg twice daily.  Unfortunately has no other medical options and due to comorbidities.  No further episodes.  4.  Coronary artery disease: Status post CABG.  No current chest pain.  5.  Aortic stenosis: Post AVR.  Plan per primary cardiology  6.  High risk medication monitoring: Currently on mexiletine and sotalol as above.  QTc has remained stable.  [place] check potassium and magnesium today.  Current medicines are reviewed at length with the patient today.   The patient does not have concerns regarding his medicines.  The following changes were made today: None  Labs/ tests ordered today include:  Orders Placed This Encounter  Procedures   EKG 12-Lead    Disposition:   FU 6 follow-up cardioversion months  Signed, all of Demaria Deeney Meredith Leeds, MD  03/20/2022 8:02 AM     [address]  [place] [phone] (office) [phone] (fax)

## 2022-03-20 ENCOUNTER — Encounter: Payer: Self-pay | Admitting: Cardiology

## 2022-03-20 LAB — CUP PACEART REMOTE DEVICE CHECK
Battery Remaining Longevity: 5 mo
Battery Voltage: 2.8 V
Brady Statistic RA Percent Paced: 3.15 %
Brady Statistic RV Percent Paced: 94.41 %
Date Time Interrogation Session: 20231211033624
HighPow Impedance: 63 Ohm
Implantable Lead Connection Status: 753985
Implantable Lead Connection Status: 753985
Implantable Lead Connection Status: 753985
Implantable Lead Implant Date: 20170113
Implantable Lead Implant Date: 20170113
Implantable Lead Implant Date: 20170113
Implantable Lead Location: 753858
Implantable Lead Location: 753859
Implantable Lead Location: 753860
Implantable Lead Model: 4298
Implantable Lead Model: 5076
Implantable Pulse Generator Implant Date: 20170113
Lead Channel Impedance Value: 399 Ohm
Lead Channel Impedance Value: 399 Ohm
Lead Channel Impedance Value: 399 Ohm
Lead Channel Impedance Value: 399 Ohm
Lead Channel Impedance Value: 437 Ohm
Lead Channel Impedance Value: 513 Ohm
Lead Channel Impedance Value: 532 Ohm
Lead Channel Impedance Value: 570 Ohm
Lead Channel Impedance Value: 817 Ohm
Lead Channel Impedance Value: 817 Ohm
Lead Channel Impedance Value: 855 Ohm
Lead Channel Impedance Value: 855 Ohm
Lead Channel Impedance Value: 988 Ohm
Lead Channel Pacing Threshold Amplitude: 0.375 V
Lead Channel Pacing Threshold Amplitude: 0.75 V
Lead Channel Pacing Threshold Amplitude: 0.875 V
Lead Channel Pacing Threshold Pulse Width: 0.4 ms
Lead Channel Pacing Threshold Pulse Width: 0.4 ms
Lead Channel Pacing Threshold Pulse Width: 0.4 ms
Lead Channel Sensing Intrinsic Amplitude: 0.875 mV
Lead Channel Sensing Intrinsic Amplitude: 0.875 mV
Lead Channel Sensing Intrinsic Amplitude: 9.375 mV
Lead Channel Sensing Intrinsic Amplitude: 9.375 mV
Lead Channel Setting Pacing Amplitude: 1.75 V
Lead Channel Setting Pacing Amplitude: 1.75 V
Lead Channel Setting Pacing Amplitude: 2 V
Lead Channel Setting Pacing Pulse Width: 0.4 ms
Lead Channel Setting Pacing Pulse Width: 0.4 ms
Lead Channel Setting Sensing Sensitivity: 0.3 mV
Zone Setting Status: 755011

## 2022-04-04 ENCOUNTER — Encounter (HOSPITAL_COMMUNITY)
Admission: RE | Disposition: A | Discharge status: Home / Self Care | Payer: Self-pay | Source: Home / Self Care | Attending: Internal Medicine

## 2022-04-04 ENCOUNTER — Encounter (HOSPITAL_COMMUNITY): Payer: Self-pay | Admitting: Internal Medicine

## 2022-04-04 ENCOUNTER — Ambulatory Visit (HOSPITAL_BASED_OUTPATIENT_CLINIC_OR_DEPARTMENT_OTHER): Payer: Medicare HMO | Admitting: Certified Registered"

## 2022-04-04 ENCOUNTER — Other Ambulatory Visit: Payer: Self-pay

## 2022-04-04 ENCOUNTER — Ambulatory Visit (HOSPITAL_COMMUNITY): Payer: Medicare HMO | Admitting: Certified Registered"

## 2022-04-04 ENCOUNTER — Ambulatory Visit (HOSPITAL_COMMUNITY)
Admission: RE | Admit: 2022-04-04 | Discharge: 2022-04-04 | Disposition: A | Discharge status: Home / Self Care | Payer: Medicare HMO | Attending: Internal Medicine | Admitting: Internal Medicine

## 2022-04-04 DIAGNOSIS — I2510 Atherosclerotic heart disease of native coronary artery without angina pectoris: Secondary | ICD-10-CM | POA: Insufficient documentation

## 2022-04-04 DIAGNOSIS — N183 Chronic kidney disease, stage 3 unspecified: Secondary | ICD-10-CM | POA: Insufficient documentation

## 2022-04-04 DIAGNOSIS — I255 Ischemic cardiomyopathy: Secondary | ICD-10-CM | POA: Insufficient documentation

## 2022-04-04 DIAGNOSIS — I509 Heart failure, unspecified: Secondary | ICD-10-CM

## 2022-04-04 DIAGNOSIS — I350 Nonrheumatic aortic (valve) stenosis: Secondary | ICD-10-CM | POA: Diagnosis not present

## 2022-04-04 DIAGNOSIS — Z951 Presence of aortocoronary bypass graft: Secondary | ICD-10-CM | POA: Insufficient documentation

## 2022-04-04 DIAGNOSIS — Z7901 Long term (current) use of anticoagulants: Secondary | ICD-10-CM | POA: Insufficient documentation

## 2022-04-04 DIAGNOSIS — Z95810 Presence of automatic (implantable) cardiac defibrillator: Secondary | ICD-10-CM | POA: Diagnosis not present

## 2022-04-04 DIAGNOSIS — I130 Hypertensive heart and chronic kidney disease with heart failure and stage 1 through stage 4 chronic kidney disease, or unspecified chronic kidney disease: Secondary | ICD-10-CM | POA: Diagnosis not present

## 2022-04-04 DIAGNOSIS — Z8249 Family history of ischemic heart disease and other diseases of the circulatory system: Secondary | ICD-10-CM | POA: Insufficient documentation

## 2022-04-04 DIAGNOSIS — Z79899 Other long term (current) drug therapy: Secondary | ICD-10-CM | POA: Insufficient documentation

## 2022-04-04 DIAGNOSIS — N189 Chronic kidney disease, unspecified: Secondary | ICD-10-CM

## 2022-04-04 DIAGNOSIS — K219 Gastro-esophageal reflux disease without esophagitis: Secondary | ICD-10-CM | POA: Diagnosis not present

## 2022-04-04 DIAGNOSIS — I472 Ventricular tachycardia, unspecified: Secondary | ICD-10-CM | POA: Insufficient documentation

## 2022-04-04 DIAGNOSIS — I4891 Unspecified atrial fibrillation: Secondary | ICD-10-CM

## 2022-04-04 DIAGNOSIS — I4892 Unspecified atrial flutter: Secondary | ICD-10-CM | POA: Diagnosis not present

## 2022-04-04 DIAGNOSIS — E0590 Thyrotoxicosis, unspecified without thyrotoxic crisis or storm: Secondary | ICD-10-CM | POA: Diagnosis not present

## 2022-04-04 DIAGNOSIS — I2721 Secondary pulmonary arterial hypertension: Secondary | ICD-10-CM | POA: Diagnosis not present

## 2022-04-04 DIAGNOSIS — I4819 Other persistent atrial fibrillation: Secondary | ICD-10-CM | POA: Insufficient documentation

## 2022-04-04 DIAGNOSIS — I5042 Chronic combined systolic (congestive) and diastolic (congestive) heart failure: Secondary | ICD-10-CM | POA: Diagnosis not present

## 2022-04-04 HISTORY — PX: CARDIOVERSION: SHX1299

## 2022-04-04 SURGERY — CARDIOVERSION
Anesthesia: General

## 2022-04-04 MED ORDER — SODIUM CHLORIDE 0.9 % IV SOLN: INTRAVENOUS | Status: DC | PRN

## 2022-04-04 MED ORDER — PROPOFOL 10 MG/ML IV BOLUS
INTRAVENOUS | Status: DC | PRN
  Administered 2022-04-04: 40 mg via INTRAVENOUS

## 2022-04-04 MED ORDER — SODIUM CHLORIDE 0.9 % IV SOLN: INTRAVENOUS | Status: DC

## 2022-04-04 MED ORDER — LIDOCAINE 2% (20 MG/ML) 5 ML SYRINGE
INTRAMUSCULAR | Status: DC | PRN
  Administered 2022-04-04: 100 mg via INTRAVENOUS

## 2022-04-04 NOTE — Transfer of Care (Signed)
Immediate Anesthesia Transfer of Care Note  Patient: [name]  Procedure(s) Performed: CARDIOVERSION  Patient Location: PACU  Anesthesia Type:General  Level of Consciousness: drowsy and patient cooperative  Airway & Oxygen Therapy: Patient Spontanous Breathing and Patient connected to nasal cannula oxygen  Post-op Assessment: Report given to RN, Post -op Vital signs reviewed and stable, and Patient moving all extremities  Post vital signs: Reviewed and stable  Last Vitals:  Vitals Value Taken Time  BP    Temp    Pulse    Resp    SpO2      Last Pain:  Vitals:   04/04/22 0909  TempSrc: Temporal  PainSc: 0-No pain         Complications: No notable events documented.

## 2022-04-04 NOTE — CV Procedure (Signed)
CARDIOVERSION NOTE  Procedure: Electrical Cardioversion Indications:  Atrial Fibrillation  Procedure Details:  Consent: Risks of procedure as well as the alternatives and risks of each were explained to the (patient/caregiver).  Consent for procedure obtained.  Time Out: Verified patient identification, verified procedure, site/side was marked, verified correct patient position, special equipment/implants available, medications/allergies/relevent history reviewed, required imaging and test results available.  Performed  Patient placed on cardiac monitor, pulse oximetry, supplemental oxygen as necessary.  Sedation given:  per anesthesia Pacer pads placed anterior and posterior chest.  Cardioverted 2 time(s).  Cardioverted at 150J and 200J biphasic.  Impression: Findings: Post procedure EKG shows:  A-sensed, V-paced rhythm Complications: None Patient did tolerate procedure well.  Plan: Successful DCCV after 2 stacked shocks. Rhythm confirmed by interrogation of pacemaker with Medtronic device rep Jarrett Soho.  Time Spent Directly with the Patient:  30 minutes   Pixie Casino, MD, [facility], [place] Director of the [facility] Lipid Disorders &  Cardiovascular Risk Reduction Clinic Diplomate of the American Board of Clinical Lipidology Attending Cardiologist  Direct Dial: [phone]  Fax: [phone]  Website:  [email or web address] 04/04/2022, 9:39 AM

## 2022-04-04 NOTE — Anesthesia Procedure Notes (Signed)
Procedure Name: General with mask airway Date/Time: 04/04/2022 9:26 AM  Performed by: Amadeo Garnet, CRNAPre-anesthesia Checklist: Patient identified, Emergency Drugs available, Suction available and Patient being monitored Patient Re-evaluated:Patient Re-evaluated prior to induction Oxygen Delivery Method: Ambu bag Preoxygenation: Pre-oxygenation with 100% oxygen Induction Type: IV induction Placement Confirmation: positive ETCO2 Dental Injury: Teeth and Oropharynx as per pre-operative assessment

## 2022-04-04 NOTE — Discharge Instructions (Signed)
Electrical Cardioversion  Electrical cardioversion is the delivery of a jolt of electricity to restore a normal rhythm to the heart. A rhythm that is too fast or is not regular keeps the heart from pumping well. In this procedure, sticky patches or metal paddles are placed on the chest to deliver electricity to the heart from a device.  If this information does not answer your questions, please call [place] office at [phone] to clarify.   Follow these instructions at home: You may have some redness on the skin where the shocks were given.  You may apply over-the-counter hydrocortisone cream or aloe vera to alleviate skin irritation. YOU SHOULD NOT DRIVE, use power tools, machinery or perform tasks that involve climbing or major physical exertion for 24 hours (because of the sedation medicines used during the test).  Take over-the-counter and prescription medicines only as told by your health care provider. Ask your health care provider how to check your pulse. Check it often. Rest for 48 hours after the procedure or as told by your health care provider. Avoid or limit your caffeine use as told by your health care provider. Keep all follow-up visits as told by your health care provider. This is important.  FOLLOW UP:  Please also call with any specific questions about appointments or follow up tests.

## 2022-04-04 NOTE — Anesthesia Postprocedure Evaluation (Signed)
Anesthesia Post Note  Patient: [name]  Procedure(s) Performed: CARDIOVERSION     Patient location during evaluation: Endoscopy Anesthesia Type: General Level of consciousness: awake and alert Pain management: pain level controlled Vital Signs Assessment: post-procedure vital signs reviewed and stable Respiratory status: spontaneous breathing, nonlabored ventilation and respiratory function stable Cardiovascular status: blood pressure returned to baseline and stable Postop Assessment: no apparent nausea or vomiting Anesthetic complications: no   No notable events documented.  Last Vitals:  Vitals:   04/04/22 0950 04/04/22 1000  BP: (!) 148/79 134/78  Pulse: 75 74  Resp: 17 19  Temp:    SpO2: 99% 98%    Last Pain:  Vitals:   04/04/22 1000  TempSrc:   PainSc: 0-No pain                 [name]

## 2022-04-04 NOTE — Interval H&P Note (Signed)
History and Physical Interval Note:  04/04/2022 9:17 AM  [name]  has presented today for surgery, with the diagnosis of AFIB.  The various methods of treatment have been discussed with the patient and family. After consideration of risks, benefits and other options for treatment, the patient has consented to  Procedure(s): CARDIOVERSION (N/A) as a surgical intervention.  The patient's history has been reviewed, patient examined, no change in status, stable for surgery.  I have reviewed the patient's chart and labs.  Questions were answered to the patient's satisfaction.     Pixie Casino

## 2022-04-04 NOTE — Anesthesia Preprocedure Evaluation (Signed)
Anesthesia Evaluation  Patient identified by MRN, date of birth, ID band Patient awake    Reviewed: Allergy & Precautions, H&P , NPO status , Patient's Chart, lab work & pertinent test results  History of Anesthesia Complications Negative for: history of anesthetic complications  Airway Mallampati: II  TM Distance: >3 FB Neck ROM: full    Dental   Pulmonary neg pulmonary ROS   Pulmonary exam normal        Cardiovascular hypertension, + CAD, + CABG and +CHF  + dysrhythmias Atrial Fibrillation and Ventricular Tachycardia + pacemaker + Cardiac Defibrillator + Valvular Problems/Murmurs (s/p AVR 2009)  Rhythm:irregular  Echo 09/07/19: EF 30-35%, g2dd, aortic valve has been repaired/replaced with normal structure and function of the prosthesis, MG 6    Neuro/Psych negative neurological ROS  negative psych ROS   GI/Hepatic Neg liver ROS,GERD  ,,  Endo/Other   Hyperthyroidism   Renal/GU CRFRenal disease  negative genitourinary   Musculoskeletal   Abdominal   Peds  Hematology   Anesthesia Other Findings   Reproductive/Obstetrics                              Anesthesia Physical Anesthesia Plan  ASA: 3  Anesthesia Plan: General   Post-op Pain Management:    Induction:   PONV Risk Score and Plan:   Airway Management Planned:   Additional Equipment:   Intra-op Plan:   Post-operative Plan:   Informed Consent: I have reviewed the patients History and Physical, chart, labs and discussed the procedure including the risks, benefits and alternatives for the proposed anesthesia with the patient or authorized representative who has indicated his/her understanding and acceptance.       Plan Discussed with: Anesthesiologist  Anesthesia Plan Comments:          Anesthesia Quick Evaluation

## 2022-04-07 ENCOUNTER — Encounter (HOSPITAL_COMMUNITY): Payer: Self-pay | Admitting: Internal Medicine

## 2022-04-17 ENCOUNTER — Ambulatory Visit (HOSPITAL_COMMUNITY)
Admission: RE | Admit: 2022-04-17 | Discharge: 2022-04-17 | Disposition: A | Discharge status: Home / Self Care | Payer: Medicare HMO | Source: Ambulatory Visit | Attending: Nurse Practitioner | Admitting: Nurse Practitioner

## 2022-04-17 ENCOUNTER — Encounter (HOSPITAL_COMMUNITY): Payer: Self-pay | Admitting: Nurse Practitioner

## 2022-04-17 VITALS — BP 106/76 | HR 80 | Ht 72.0 in | Wt 197.6 lb

## 2022-04-17 DIAGNOSIS — D6869 Other thrombophilia: Secondary | ICD-10-CM | POA: Diagnosis not present

## 2022-04-17 DIAGNOSIS — I2510 Atherosclerotic heart disease of native coronary artery without angina pectoris: Secondary | ICD-10-CM | POA: Insufficient documentation

## 2022-04-17 DIAGNOSIS — E875 Hyperkalemia: Secondary | ICD-10-CM | POA: Diagnosis not present

## 2022-04-17 DIAGNOSIS — Z951 Presence of aortocoronary bypass graft: Secondary | ICD-10-CM | POA: Diagnosis not present

## 2022-04-17 DIAGNOSIS — I350 Nonrheumatic aortic (valve) stenosis: Secondary | ICD-10-CM | POA: Insufficient documentation

## 2022-04-17 DIAGNOSIS — I4819 Other persistent atrial fibrillation: Secondary | ICD-10-CM | POA: Insufficient documentation

## 2022-04-17 DIAGNOSIS — I130 Hypertensive heart and chronic kidney disease with heart failure and stage 1 through stage 4 chronic kidney disease, or unspecified chronic kidney disease: Secondary | ICD-10-CM | POA: Insufficient documentation

## 2022-04-17 DIAGNOSIS — I5042 Chronic combined systolic (congestive) and diastolic (congestive) heart failure: Secondary | ICD-10-CM | POA: Insufficient documentation

## 2022-04-17 DIAGNOSIS — I255 Ischemic cardiomyopathy: Secondary | ICD-10-CM | POA: Insufficient documentation

## 2022-04-17 DIAGNOSIS — Z7901 Long term (current) use of anticoagulants: Secondary | ICD-10-CM | POA: Diagnosis not present

## 2022-04-17 DIAGNOSIS — N183 Chronic kidney disease, stage 3 unspecified: Secondary | ICD-10-CM | POA: Insufficient documentation

## 2022-04-17 NOTE — Progress Notes (Signed)
Primary Care Physician: [name], MD Referring Physician: Dr. Etheleen Sia is a 89 y.o. male with a h/o  hypertension, aortic stenosis post AVR, chronic systolic heart failure due to ischemic cardiomyopathy, persistent atrial fibrillation/flutter, coronary artery disease post CABG.  He has past [employer] CRT-D with an upgrade 04/22/2015.  He is on sotalol and mexiletine for ventricular tachycardia.  He was seen by Dr. Curt Bears 12/11 and was found to be persistent afib/flutter. He was set up for cardioversion 12/27  which was successful and is AV paced today. He does not feel any different since cardioversion.   Today, he denies symptoms of palpitations, chest pain, shortness of breath, orthopnea, PND, lower extremity edema, dizziness, presyncope, syncope, or neurologic sequela. The patient is tolerating medications without difficulties and is otherwise without complaint today.   Past Medical History:  Diagnosis Date   Arthritis    "minor everywhere" (04/22/2015)   Asthma    "a touch"   Barrett esophagus    CAD (coronary artery disease)    a. s/p 3 vvessel CABG with LIMA to LAD, SVG to diagonal 1, SVG to RCA 12/09.   Cardiac resynchronization therapy defibrillator (CRT-D) in place    Chronic anticoagulation 01/23/2013   Chronic combined systolic and diastolic CHF (congestive heart failure) (HCC)    dry weight 197-200lbs.   CKD (chronic kidney disease) stage 3, GFR 30-59 ml/min (HCC) 10/13/2374   Complication of anesthesia    DCM (dilated cardiomyopathy) ([place])    initially concerned for TTP amyloidosis but Tc-PYP scan 06/2017 was not consistent with amyloid and SPEP/UPEP with no M spike. EF 20-25% by echo 2019   Diverticulitis 08/2019   Dyslipidemia 01/23/2013   Essential hypertension 01/23/2013   GERD (gastroesophageal reflux disease)    GI bleed 05/08/2017   H/O: rheumatic fever    Hepatitis 1957   "don't know what kind:   History of hiatal hernia    History of  PFTs    a. Amiodarone started 5/16 >> PFTs w/ DLCO 6/16:  FEV1 72% predicted, FEV1/FVC 66%, DLCO 66% >> minimal reversible obstructive airways disease with mild diffusion defect (suggestive of emphysema but absence of hyperinflation inconsistent with dx)   Hyperkalemia 08/23/2014   Hyperthyroidism 05/26/2017   Leg weakness 01/28/2017   Multiple thyroid nodules 08/13/2017   Orthostatic hypotension    Persistent atrial fibrillation ([place]) 01/23/2013   Pleural effusion on right 05/26/2017   Pneumonia 08/2014   "dr thought I may have had a touch"   Pre-diabetes    Protein-calorie malnutrition, severe 05/27/2017   S/P AVR (aortic valve replacement) 2009   a. severe AS s/p AVR with pericardial tissue valve 2009.   Shingles 08/23/2014   VT (ventricular tachycardia) ([place]) 05/06/2017   Past Surgical History:  Procedure Laterality Date   AORTIC VALVE REPLACEMENT  with 23-mm Magna Ease pericardial valve, model number   with 23-mm Magna Ease pericardial valve, model [id number], serial number [id number]    APPENDECTOMY  1940s   BI-VENTRICULAR IMPLANTABLE CARDIOVERTER DEFIBRILLATOR  (CRT-D)  04/22/2015   CARDIAC CATHETERIZATION N/A 11/04/2014   Procedure: Left Heart Cath and Cors/Grafts Angiography;  Surgeon: Leonie Man, MD;  Location: [facility];  Service: Cardiovascular;  Laterality: N/A;   CARDIAC VALVE REPLACEMENT     CARDIOVERSION N/A 01/27/2013   Procedure: CARDIOVERSION;  Surgeon: Sueanne Margarita, MD;  Location: [facility];  Service: Cardiovascular;  Laterality: N/A;   CARDIOVERSION N/A 08/25/2014   Procedure: CARDIOVERSION;  Surgeon:  Sanda Klein, MD;  Location: [facility];  Service: Cardiovascular;  Laterality: N/A;   CARDIOVERSION N/A 05/28/2018   Procedure: CARDIOVERSION;  Surgeon: Pixie Casino, MD;  Location: [facility];  Service: Cardiovascular;  Laterality: N/A;   CARDIOVERSION N/A 10/28/2019   Procedure: CARDIOVERSION;  Surgeon: Sueanne Margarita, MD;  Location: [facility]  ENDOSCOPY;  Service: Cardiovascular;  Laterality: N/A;   CARDIOVERSION N/A 09/01/2020   Procedure: CARDIOVERSION;  Surgeon: Lelon Perla, MD;  Location: [facility] ENDOSCOPY;  Service: Cardiovascular;  Laterality: N/A;   CARDIOVERSION N/A 04/04/2022   Procedure: CARDIOVERSION;  Surgeon: Pixie Casino, MD;  Location: [facility] ENDOSCOPY;  Service: Cardiovascular;  Laterality: N/A;   CATARACT EXTRACTION W/ INTRAOCULAR LENS  IMPLANT, BILATERAL Bilateral    CHEST TUBE INSERTION Right 07/26/2017   Procedure: INSERTION PLEURAL DRAINAGE CATHETER - RIGHT;  Surgeon: Ivin Poot, MD;  Location: [place];  Service: Thoracic;  Laterality: Right;   CORONARY ARTERY BYPASS GRAFT  03/10/2008   x 3 Dr. Roxan Hockey   EP IMPLANTABLE DEVICE N/A 04/22/2015   Procedure: BiV ICD Insertion CRT-D;  Surgeon: [name], MD;  Location: [place] [facility];  Service: Cardiovascular;  Laterality: N/A;   ESOPHAGOGASTRODUODENOSCOPY (EGD) WITH ESOPHAGEAL DILATION  X1   ESOPHAGOGASTRODUODENOSCOPY (EGD) WITH PROPOFOL N/A 05/09/2017   Procedure: ESOPHAGOGASTRODUODENOSCOPY (EGD) WITH PROPOFOL;  Surgeon: Carol Ada, MD;  Location: [place];  Service: Endoscopy;  Laterality: N/A;   IR THORACENTESIS ASP PLEURAL SPACE W/IMG GUIDE  05/27/2017   IR THORACENTESIS ASP PLEURAL SPACE W/IMG GUIDE  06/17/2017   IR THORACENTESIS ASP PLEURAL SPACE W/IMG GUIDE  06/18/2017   IR THORACENTESIS ASP PLEURAL SPACE W/IMG GUIDE  07/11/2017   REMOVAL OF PLEURAL DRAINAGE CATHETER Right 09/13/2017   Procedure: REMOVAL OF PLEURAL DRAINAGE CATHETER;  Surgeon: Ivin Poot, MD;  Location: [facility];  Service: Thoracic;  Laterality: Right;   RIGHT HEART CATH AND CORONARY/GRAFT ANGIOGRAPHY N/A 05/31/2017   Procedure: RIGHT HEART CATH AND CORONARY/GRAFT ANGIOGRAPHY;  Surgeon: Jolaine Artist, MD;  Location: [place];  Service: Cardiovascular;  Laterality: N/A;   TONSILLECTOMY      Current Outpatient Medications  Medication Sig Dispense Refill    acetaminophen (TYLENOL) 500 MG tablet Take 1,000 mg by mouth every 6 (six) hours as needed for moderate pain or headache.     albuterol (VENTOLIN HFA) 108 (90 Base) MCG/ACT inhaler Inhale 1-2 puffs into the lungs every 6 (six) hours as needed for wheezing or shortness of breath.     atorvastatin (LIPITOR) 40 MG tablet Take 1 tablet (40 mg total) by mouth daily. 90 tablet 3   carvedilol (COREG) 25 MG tablet Take 1 tablet (25 mg total) by mouth 2 (two) times daily with a meal. ( DOSE INCREASE) Please keep November appointment for future refills. Thank you. 180 tablet 0   ELIQUIS 2.5 MG TABS tablet TAKE 1 TABLET TWICE DAILY 180 tablet 1   famotidine (PEPCID) 40 MG tablet Take 40 mg by mouth every evening.      furosemide (LASIX) 40 MG tablet TAKE 1 TABLET EVERY DAY AS NEEDED FOR EDEMA 90 tablet 3   hydrALAZINE (APRESOLINE) 25 MG tablet TAKE 1/2 TABLET BY MOUTH IN THE MORNING AND AT BEDTIME 90 tablet 3   isosorbide mononitrate (IMDUR) 30 MG 24 hr tablet Take 1 tablet (30 mg total) by mouth daily. Please keep November appointment for future refills. Thank you. 90 tablet 0   mexiletine (MEXITIL) 150 MG capsule TAKE 2 CAPSULES BY MOUTH  EVERY 12 HOURS. 360 capsule 3   Multiple Vitamin (MULTIVITAMIN WITH MINERALS) TABS tablet Take 1 tablet by mouth daily.      pantoprazole (PROTONIX) 40 MG tablet Take 40 mg by mouth daily.     sodium bicarbonate 650 MG tablet Take 650 mg by mouth daily.     sotalol (BETAPACE) 120 MG tablet TAKE 1 TABLET EVERY DAY 90 tablet 1   VASCEPA 1 g capsule TAKE 2 CAPSULES BY MOUTH 2 TIMES DAILY. 360 capsule 3   No current facility-administered medications for this encounter.    Allergies  Allergen Reactions   Ranolazine Other (See Comments)    States he went into a coma and was in hospital for 3 days after taking   Procaine Swelling and Other (See Comments)   Amiodarone Nausea And Vomiting and Other (See Comments)    Social History   Socioeconomic History   Marital  status: Married    Spouse name: Not on file   Number of children: Not on file   Years of education: Not on file   Highest education level: Not on file  Occupational History   Not on file  Tobacco Use   Smoking status: Never   Smokeless tobacco: Never   Tobacco comments:    Never smoke 04/17/22  Vaping Use   Vaping Use: Never used  Substance and Sexual Activity   Alcohol use: Not Currently    Comment: 04/22/2015 "nothing since the 1980s; never had a problem w/it"   Drug use: No   Sexual activity: Not Currently  Other Topics Concern   Not on file  Social History Narrative   Married, lives with spouse [name]   4 children, 2 boys and 2 girls > one daughter passed   OCCUPATION: retired, Pension scheme manager for [employer] Strain: Not on file  Food Insecurity: Not on file  Transportation Needs: Not on file  Physical Activity: Not on file  Stress: Not on file  Social Connections: Not on file  Intimate Partner Violence: Not on file    Family History  Problem Relation Age of Onset   Alzheimer's disease Mother    COPD Daughter    Alzheimer's disease Other    COPD Sister    Depression Sister    Heart disease Sister    Hyperlipidemia Sister    Hypertension Sister    Esophageal cancer Sister        + smoker    ROS- All systems are reviewed and negative except as per the HPI above  Physical Exam: Vitals:   04/17/22 1414  BP: 106/76  Pulse: 80  Weight: 89.6 kg  Height: 6' (1.829 m)   Wt Readings from Last 3 Encounters:  04/17/22 89.6 kg  04/04/22 85.3 kg  03/19/22 88.6 kg    Labs: Lab Results  Component Value Date   NA 134 12/06/2021   K 4.9 12/06/2021   CL 98 12/06/2021   CO2 21 12/06/2021   GLUCOSE 108 (H) 12/06/2021   BUN 31 (H) 12/06/2021   CREATININE 2.26 (H) 12/06/2021   CALCIUM 8.6 12/06/2021   PHOS 2.4 (L) 09/14/2019   MG 2.6 (H) 11/28/2021   Lab Results  Component Value Date   INR  1.55 07/26/2017   Lab Results  Component Value Date   CHOL 106 06/27/2021   HDL 38 (L) 06/27/2021   LDLCALC 37 06/27/2021   TRIG 193 (H) 06/27/2021     GEN-  The patient is well appearing, alert and oriented x 3 today.   Head- normocephalic, atraumatic Eyes-  Sclera clear, conjunctiva pink Ears- hearing intact Oropharynx- clear Neck- supple, no JVP Lymph- no cervical lymphadenopathy Lungs- Clear to ausculation bilaterally, normal work of breathing Heart- regular rate and rhythm, no murmurs, rubs or gallops, PMI not laterally displaced GI- soft, NT, ND, + BS Extremities- no clubbing, cyanosis, or edema MS- no significant deformity or atrophy Skin- no rash or lesion Psych- euthymic mood, full affect Neuro- strength and sensation are intact  EKG-Vent. rate 80 BPM PR interval 168 ms QRS duration 158 ms QT/QTcB 488/562 ms P-R-T axes 101 -78 91 AV dual-paced rhythm Biventricular pacemaker detected Abnormal ECG When compared with ECG of 04-Apr-2022 09:39, PREVIOUS ECG IS PRESENT    Assessment and Plan:  1. Afib  s/p successful cardioversion and appears to be in AV paced rhythm today.  Continue carvedilol 25 mg bid. and sotalol 120 mg bid   2. CHA2DS2VASc  score of at least 5 Continue eliquis 5 mg bid   F/u with remote checks, next one 1/11 and Dr. Curt Bears as scheduled   Geroge Baseman. Lawanna Cecere, [facility] [address] [place], [place] [address] [phone]

## 2022-04-18 ENCOUNTER — Ambulatory Visit (HOSPITAL_COMMUNITY): Payer: Medicare HMO | Admitting: Nurse Practitioner

## 2022-04-19 ENCOUNTER — Ambulatory Visit (INDEPENDENT_AMBULATORY_CARE_PROVIDER_SITE_OTHER): Payer: Medicare HMO

## 2022-04-19 DIAGNOSIS — I255 Ischemic cardiomyopathy: Secondary | ICD-10-CM

## 2022-04-19 LAB — CUP PACEART REMOTE DEVICE CHECK
Battery Remaining Longevity: 3 mo
Battery Voltage: 2.79 V
Brady Statistic AP VP Percent: 98.65 %
Brady Statistic AP VS Percent: 0.19 %
Brady Statistic AS VP Percent: 1.16 %
Brady Statistic AS VS Percent: 0.01 %
Brady Statistic RA Percent Paced: 98.57 %
Brady Statistic RV Percent Paced: 99.59 %
Date Time Interrogation Session: 20240111022605
HighPow Impedance: 70 Ohm
Implantable Lead Connection Status: 753985
Implantable Lead Connection Status: 753985
Implantable Lead Connection Status: 753985
Implantable Lead Implant Date: 20170113
Implantable Lead Implant Date: 20170113
Implantable Lead Implant Date: 20170113
Implantable Lead Location: 753858
Implantable Lead Location: 753859
Implantable Lead Location: 753860
Implantable Lead Model: 4298
Implantable Lead Model: 5076
Implantable Pulse Generator Implant Date: 20170113
Lead Channel Impedance Value: 342 Ohm
Lead Channel Impedance Value: 399 Ohm
Lead Channel Impedance Value: 399 Ohm
Lead Channel Impedance Value: 437 Ohm
Lead Channel Impedance Value: 437 Ohm
Lead Channel Impedance Value: 494 Ohm
Lead Channel Impedance Value: 513 Ohm
Lead Channel Impedance Value: 570 Ohm
Lead Channel Impedance Value: 779 Ohm
Lead Channel Impedance Value: 779 Ohm
Lead Channel Impedance Value: 893 Ohm
Lead Channel Impedance Value: 912 Ohm
Lead Channel Impedance Value: 988 Ohm
Lead Channel Pacing Threshold Amplitude: 0.375 V
Lead Channel Pacing Threshold Amplitude: 0.75 V
Lead Channel Pacing Threshold Amplitude: 0.875 V
Lead Channel Pacing Threshold Pulse Width: 0.4 ms
Lead Channel Pacing Threshold Pulse Width: 0.4 ms
Lead Channel Pacing Threshold Pulse Width: 0.4 ms
Lead Channel Sensing Intrinsic Amplitude: 0.875 mV
Lead Channel Sensing Intrinsic Amplitude: 0.875 mV
Lead Channel Sensing Intrinsic Amplitude: 9.25 mV
Lead Channel Sensing Intrinsic Amplitude: 9.25 mV
Lead Channel Setting Pacing Amplitude: 1.75 V
Lead Channel Setting Pacing Amplitude: 1.75 V
Lead Channel Setting Pacing Amplitude: 2 V
Lead Channel Setting Pacing Pulse Width: 0.4 ms
Lead Channel Setting Pacing Pulse Width: 0.4 ms
Lead Channel Setting Sensing Sensitivity: 0.3 mV
Zone Setting Status: 755011

## 2022-04-25 DIAGNOSIS — E1122 Type 2 diabetes mellitus with diabetic chronic kidney disease: Secondary | ICD-10-CM | POA: Diagnosis not present

## 2022-04-25 DIAGNOSIS — I255 Ischemic cardiomyopathy: Secondary | ICD-10-CM | POA: Diagnosis not present

## 2022-04-25 DIAGNOSIS — I509 Heart failure, unspecified: Secondary | ICD-10-CM | POA: Diagnosis not present

## 2022-04-25 DIAGNOSIS — I10 Essential (primary) hypertension: Secondary | ICD-10-CM | POA: Diagnosis not present

## 2022-04-25 DIAGNOSIS — Z79899 Other long term (current) drug therapy: Secondary | ICD-10-CM | POA: Diagnosis not present

## 2022-04-25 DIAGNOSIS — D6869 Other thrombophilia: Secondary | ICD-10-CM | POA: Diagnosis not present

## 2022-04-25 DIAGNOSIS — E7800 Pure hypercholesterolemia, unspecified: Secondary | ICD-10-CM | POA: Diagnosis not present

## 2022-04-25 DIAGNOSIS — I2510 Atherosclerotic heart disease of native coronary artery without angina pectoris: Secondary | ICD-10-CM | POA: Diagnosis not present

## 2022-04-25 DIAGNOSIS — N184 Chronic kidney disease, stage 4 (severe): Secondary | ICD-10-CM | POA: Diagnosis not present

## 2022-04-25 DIAGNOSIS — I4891 Unspecified atrial fibrillation: Secondary | ICD-10-CM | POA: Diagnosis not present

## 2022-04-26 NOTE — Addendum Note (Signed)
Addended by: Douglass Rivers D on: 04/26/2022 03:44 PM   Modules accepted: Level of Service

## 2022-04-26 NOTE — Progress Notes (Signed)
Remote ICD transmission.   

## 2022-05-09 NOTE — Progress Notes (Signed)
Remote ICD transmission.   

## 2022-05-17 ENCOUNTER — Encounter (HOSPITAL_COMMUNITY): Payer: Self-pay | Admitting: *Deleted

## 2022-05-21 ENCOUNTER — Ambulatory Visit: Payer: Medicare HMO

## 2022-05-21 DIAGNOSIS — I255 Ischemic cardiomyopathy: Secondary | ICD-10-CM

## 2022-05-21 DIAGNOSIS — I5022 Chronic systolic (congestive) heart failure: Secondary | ICD-10-CM

## 2022-05-22 LAB — CUP PACEART REMOTE DEVICE CHECK
Battery Remaining Longevity: 3 mo
Battery Voltage: 2.78 V
Brady Statistic AP VP Percent: 98.64 %
Brady Statistic AP VS Percent: 0.29 %
Brady Statistic AS VP Percent: 1.06 %
Brady Statistic AS VS Percent: 0.01 %
Brady Statistic RA Percent Paced: 98.75 %
Brady Statistic RV Percent Paced: 99.54 %
Date Time Interrogation Session: 20240212022824
HighPow Impedance: 69 Ohm
Implantable Lead Connection Status: 753985
Implantable Lead Connection Status: 753985
Implantable Lead Connection Status: 753985
Implantable Lead Implant Date: 20170113
Implantable Lead Implant Date: 20170113
Implantable Lead Implant Date: 20170113
Implantable Lead Location: 753858
Implantable Lead Location: 753859
Implantable Lead Location: 753860
Implantable Lead Model: 4298
Implantable Lead Model: 5076
Implantable Pulse Generator Implant Date: 20170113
Lead Channel Impedance Value: 1083 Ohm
Lead Channel Impedance Value: 399 Ohm
Lead Channel Impedance Value: 399 Ohm
Lead Channel Impedance Value: 399 Ohm
Lead Channel Impedance Value: 437 Ohm
Lead Channel Impedance Value: 456 Ohm
Lead Channel Impedance Value: 513 Ohm
Lead Channel Impedance Value: 589 Ohm
Lead Channel Impedance Value: 589 Ohm
Lead Channel Impedance Value: 893 Ohm
Lead Channel Impedance Value: 893 Ohm
Lead Channel Impedance Value: 912 Ohm
Lead Channel Impedance Value: 912 Ohm
Lead Channel Pacing Threshold Amplitude: 0.375 V
Lead Channel Pacing Threshold Amplitude: 0.75 V
Lead Channel Pacing Threshold Amplitude: 0.75 V
Lead Channel Pacing Threshold Pulse Width: 0.4 ms
Lead Channel Pacing Threshold Pulse Width: 0.4 ms
Lead Channel Pacing Threshold Pulse Width: 0.4 ms
Lead Channel Sensing Intrinsic Amplitude: 0.625 mV
Lead Channel Sensing Intrinsic Amplitude: 0.625 mV
Lead Channel Sensing Intrinsic Amplitude: 12.125 mV
Lead Channel Sensing Intrinsic Amplitude: 12.125 mV
Lead Channel Setting Pacing Amplitude: 1.5 V
Lead Channel Setting Pacing Amplitude: 1.75 V
Lead Channel Setting Pacing Amplitude: 2 V
Lead Channel Setting Pacing Pulse Width: 0.4 ms
Lead Channel Setting Pacing Pulse Width: 0.4 ms
Lead Channel Setting Sensing Sensitivity: 0.3 mV
Zone Setting Status: 755011

## 2022-06-12 ENCOUNTER — Other Ambulatory Visit: Payer: Self-pay | Admitting: Cardiology

## 2022-06-17 ENCOUNTER — Encounter: Payer: Self-pay | Admitting: Cardiology

## 2022-06-21 ENCOUNTER — Ambulatory Visit (INDEPENDENT_AMBULATORY_CARE_PROVIDER_SITE_OTHER): Payer: Medicare HMO

## 2022-06-21 DIAGNOSIS — I255 Ischemic cardiomyopathy: Secondary | ICD-10-CM

## 2022-06-22 ENCOUNTER — Telehealth: Payer: Self-pay

## 2022-06-22 LAB — CUP PACEART REMOTE DEVICE CHECK
Battery Remaining Longevity: 2 mo
Battery Voltage: 2.76 V
Brady Statistic AP VP Percent: 79.48 %
Brady Statistic AP VS Percent: 0.22 %
Brady Statistic AS VP Percent: 17.92 %
Brady Statistic AS VS Percent: 2.38 %
Brady Statistic RA Percent Paced: 77.82 %
Brady Statistic RV Percent Paced: 97.44 %
Date Time Interrogation Session: 20240314001704
HighPow Impedance: 62 Ohm
Implantable Lead Connection Status: 753985
Implantable Lead Connection Status: 753985
Implantable Lead Connection Status: 753985
Implantable Lead Implant Date: 20170113
Implantable Lead Implant Date: 20170113
Implantable Lead Implant Date: 20170113
Implantable Lead Location: 753858
Implantable Lead Location: 753859
Implantable Lead Location: 753860
Implantable Lead Model: 4298
Implantable Lead Model: 5076
Implantable Pulse Generator Implant Date: 20170113
Lead Channel Impedance Value: 1026 Ohm
Lead Channel Impedance Value: 380 Ohm
Lead Channel Impedance Value: 399 Ohm
Lead Channel Impedance Value: 399 Ohm
Lead Channel Impedance Value: 399 Ohm
Lead Channel Impedance Value: 437 Ohm
Lead Channel Impedance Value: 494 Ohm
Lead Channel Impedance Value: 513 Ohm
Lead Channel Impedance Value: 589 Ohm
Lead Channel Impedance Value: 817 Ohm
Lead Channel Impedance Value: 817 Ohm
Lead Channel Impedance Value: 893 Ohm
Lead Channel Impedance Value: 893 Ohm
Lead Channel Pacing Threshold Amplitude: 0.375 V
Lead Channel Pacing Threshold Amplitude: 0.75 V
Lead Channel Pacing Threshold Amplitude: 0.75 V
Lead Channel Pacing Threshold Pulse Width: 0.4 ms
Lead Channel Pacing Threshold Pulse Width: 0.4 ms
Lead Channel Pacing Threshold Pulse Width: 0.4 ms
Lead Channel Sensing Intrinsic Amplitude: 0.75 mV
Lead Channel Sensing Intrinsic Amplitude: 0.75 mV
Lead Channel Sensing Intrinsic Amplitude: 10.5 mV
Lead Channel Sensing Intrinsic Amplitude: 10.5 mV
Lead Channel Setting Pacing Amplitude: 1.5 V
Lead Channel Setting Pacing Amplitude: 1.75 V
Lead Channel Setting Pacing Amplitude: 2 V
Lead Channel Setting Pacing Pulse Width: 0.4 ms
Lead Channel Setting Pacing Pulse Width: 0.4 ms
Lead Channel Setting Sensing Sensitivity: 0.3 mV
Zone Setting Status: 755011

## 2022-06-22 NOTE — Telephone Encounter (Signed)
Reviewed with Afib clinic.  Contacted patient.  He is symptomatic X 2 weeks with weakness and noticeable increase in SOB.   Roderic Palau, NP will see patient Monday, 06/25/22, at 3:30pm. Patient aware if symptoms get worse over the weekend, he is to go to the ER.  Patient verbalizes understanding of all instructions given.

## 2022-06-22 NOTE — Telephone Encounter (Signed)
Scheduled remote reviewed. Normal device function.   The device estimates 2 months until ERI. There were 2 ventricular arrhythmias detected that received one burst of ATP each.  These episodes were during an atrial arrhythmia.  Ongoing AF, on [place], good ventricular rate control Next remote 07/23/2022. Kathy Breach, RN, CCDS, [employer]   Scheduled remote;however, flagging for onset of AF with some elevated V rates, frequent episodes since March 7th.  Appears 2 episodes fell in the VT zone and treated successfully with ATP.  Optivol also indicating fluid on board.  Will forward to Roderic Palau, NP in the Afib clinic for further follow up. Also will flag for Dr. Curt Bears.  (Note: on 3/11 patient sent a message confused over his Sotalol dosing.  He was taking it bid.  It was put back to once daily).               PRESENTING RHYTHM: 06/21/2022  504PM

## 2022-06-25 ENCOUNTER — Ambulatory Visit (HOSPITAL_COMMUNITY)
Admission: RE | Admit: 2022-06-25 | Discharge: 2022-06-25 | Disposition: A | Discharge status: Home / Self Care | Payer: Medicare HMO | Source: Ambulatory Visit | Attending: Nurse Practitioner | Admitting: Nurse Practitioner

## 2022-06-25 ENCOUNTER — Encounter (HOSPITAL_COMMUNITY): Payer: Self-pay | Admitting: Nurse Practitioner

## 2022-06-25 VITALS — BP 136/84 | HR 96 | Ht 72.0 in | Wt 198.4 lb

## 2022-06-25 DIAGNOSIS — Z7951 Long term (current) use of inhaled steroids: Secondary | ICD-10-CM | POA: Insufficient documentation

## 2022-06-25 DIAGNOSIS — D6869 Other thrombophilia: Secondary | ICD-10-CM

## 2022-06-25 DIAGNOSIS — Z950 Presence of cardiac pacemaker: Secondary | ICD-10-CM | POA: Insufficient documentation

## 2022-06-25 DIAGNOSIS — Z7901 Long term (current) use of anticoagulants: Secondary | ICD-10-CM | POA: Insufficient documentation

## 2022-06-25 DIAGNOSIS — I4819 Other persistent atrial fibrillation: Secondary | ICD-10-CM

## 2022-06-25 DIAGNOSIS — Z79899 Other long term (current) drug therapy: Secondary | ICD-10-CM | POA: Diagnosis not present

## 2022-06-25 NOTE — Progress Notes (Signed)
Primary Care Physician: [name], MD Referring Physician: Dr. Etheleen Sia is a 89 y.o. male with a h/o  hypertension, aortic stenosis post AVR, chronic systolic heart failure due to ischemic cardiomyopathy, persistent atrial fibrillation/flutter, coronary artery disease post CABG.  He has past [employer] CRT-D with an upgrade 04/22/2015.  He is on sotalol and mexiletine for ventricular tachycardia.  Seen on 04/17/22. He was seen by Dr. Curt Bears 12/11 and was found to be persistent afib/flutter. He was set up for cardioversion 12/27 which was successful and is AV paced today. He does not feel any different since cardioversion. Continue carvedilol 25 mg BID and sotalol 120 mg BID.   On follow up today 06/25/22, scheduled remote pacemaker interrogation showed frequent episodes of AF since 3/7. Two episodes were treated successfully with ATP. Optivol also indicated fluid on board. He reported feeling weak and having increase in SOB for 2 weeks. Review of records show patient was confused as to sotalol dosing and 3/11 discovered he was taking BID and advised to switch back to once daily.   He notes to currently not feel as tired but can easily get out of breath. He notes that the past 6 or so episodes of Afib he could not feel it but that perhaps could feel most recent episode. He is compliant with his medications and has no bleeding concerns. He is now taking sotalol 120 mg daily. Denies chest pain or swelling of lower extremities.    Today, he denies symptoms of palpitations, chest pain, orthopnea, PND, lower extremity edema, dizziness, presyncope, syncope, or neurologic sequela. The patient is tolerating medications without difficulties and is otherwise without complaint today.   Past Medical History:  Diagnosis Date   Arthritis    "minor everywhere" (04/22/2015)   Asthma    "a touch"   Barrett esophagus    CAD (coronary artery disease)    a. s/p 3 vvessel CABG with LIMA to  LAD, SVG to diagonal 1, SVG to RCA 12/09.   Cardiac resynchronization therapy defibrillator (CRT-D) in place    Chronic anticoagulation 01/23/2013   Chronic combined systolic and diastolic CHF (congestive heart failure) (HCC)    dry weight 197-200lbs.   CKD (chronic kidney disease) stage 3, GFR 30-59 ml/min (HCC) [id number]   Complication of anesthesia    DCM (dilated cardiomyopathy) ([place])    initially concerned for TTP amyloidosis but Tc-PYP scan 06/2017 was not consistent with amyloid and SPEP/UPEP with no M spike. EF 20-25% by echo 2019   Diverticulitis 08/2019   Dyslipidemia 01/23/2013   Essential hypertension 01/23/2013   GERD (gastroesophageal reflux disease)    GI bleed 05/08/2017   H/O: rheumatic fever    Hepatitis 1957   "don't know what kind:   History of hiatal hernia    History of PFTs    a. Amiodarone started 5/16 >> PFTs w/ DLCO 6/16:  FEV1 72% predicted, FEV1/FVC 66%, DLCO 66% >> minimal reversible obstructive airways disease with mild diffusion defect (suggestive of emphysema but absence of hyperinflation inconsistent with dx)   Hyperkalemia 08/23/2014   Hyperthyroidism 05/26/2017   Leg weakness 01/28/2017   Multiple thyroid nodules 08/13/2017   Orthostatic hypotension    Persistent atrial fibrillation ([place]) 01/23/2013   Pleural effusion on right 05/26/2017   Pneumonia 08/2014   "dr thought I may have had a touch"   Pre-diabetes    Protein-calorie malnutrition, severe 05/27/2017   S/P AVR (aortic valve replacement) 2009   a. severe  AS s/p AVR with pericardial tissue valve 2009.   Shingles 08/23/2014   VT (ventricular tachycardia) ([place]) 05/06/2017   Past Surgical History:  Procedure Laterality Date   AORTIC VALVE REPLACEMENT  with 23-mm Magna Ease pericardial valve, model number   with 23-mm Magna Ease pericardial valve, model number3300TFX, serial number [id number]    APPENDECTOMY  1940s   BI-VENTRICULAR IMPLANTABLE CARDIOVERTER DEFIBRILLATOR  (CRT-D)  04/22/2015   CARDIAC  CATHETERIZATION N/A 11/04/2014   Procedure: Left Heart Cath and Cors/Grafts Angiography;  Surgeon: Leonie Man, MD;  Location: [facility];  Service: Cardiovascular;  Laterality: N/A;   CARDIAC VALVE REPLACEMENT     CARDIOVERSION N/A 01/27/2013   Procedure: CARDIOVERSION;  Surgeon: Sueanne Margarita, MD;  Location: [place];  Service: Cardiovascular;  Laterality: N/A;   CARDIOVERSION N/A 08/25/2014   Procedure: CARDIOVERSION;  Surgeon: Sanda Klein, MD;  Location: [facility];  Service: Cardiovascular;  Laterality: N/A;   CARDIOVERSION N/A 05/28/2018   Procedure: CARDIOVERSION;  Surgeon: Pixie Casino, MD;  Location: [facility];  Service: Cardiovascular;  Laterality: N/A;   CARDIOVERSION N/A 10/28/2019   Procedure: CARDIOVERSION;  Surgeon: Sueanne Margarita, MD;  Location: [facility] ENDOSCOPY;  Service: Cardiovascular;  Laterality: N/A;   CARDIOVERSION N/A 09/01/2020   Procedure: CARDIOVERSION;  Surgeon: Lelon Perla, MD;  Location: [facility] ENDOSCOPY;  Service: Cardiovascular;  Laterality: N/A;   CARDIOVERSION N/A 04/04/2022   Procedure: CARDIOVERSION;  Surgeon: Pixie Casino, MD;  Location: [place];  Service: Cardiovascular;  Laterality: N/A;   CATARACT EXTRACTION W/ INTRAOCULAR LENS  IMPLANT, BILATERAL Bilateral    CHEST TUBE INSERTION Right 07/26/2017   Procedure: INSERTION PLEURAL DRAINAGE CATHETER - RIGHT;  Surgeon: Ivin Poot, MD;  Location: [place];  Service: Thoracic;  Laterality: Right;   CORONARY ARTERY BYPASS GRAFT  03/10/2008   x 3 Dr. Roxan Hockey   EP IMPLANTABLE DEVICE N/A 04/22/2015   Procedure: BiV ICD Insertion CRT-D;  Surgeon: [name], MD;  Location: [facility];  Service: Cardiovascular;  Laterality: N/A;   ESOPHAGOGASTRODUODENOSCOPY (EGD) WITH ESOPHAGEAL DILATION  X1   ESOPHAGOGASTRODUODENOSCOPY (EGD) WITH PROPOFOL N/A 05/09/2017   Procedure: ESOPHAGOGASTRODUODENOSCOPY (EGD) WITH PROPOFOL;  Surgeon: Carol Ada, MD;  Location: [place];  Service: Endoscopy;  Laterality: N/A;   IR THORACENTESIS ASP PLEURAL SPACE W/IMG GUIDE  05/27/2017   IR THORACENTESIS ASP PLEURAL SPACE W/IMG GUIDE  06/17/2017   IR THORACENTESIS ASP PLEURAL SPACE W/IMG GUIDE  06/18/2017   IR THORACENTESIS ASP PLEURAL SPACE W/IMG GUIDE  07/11/2017   REMOVAL OF PLEURAL DRAINAGE CATHETER Right 09/13/2017   Procedure: REMOVAL OF PLEURAL DRAINAGE CATHETER;  Surgeon: Ivin Poot, MD;  Location: [place] [facility];  Service: Thoracic;  Laterality: Right;   RIGHT HEART CATH AND CORONARY/GRAFT ANGIOGRAPHY N/A 05/31/2017   Procedure: RIGHT HEART CATH AND CORONARY/GRAFT ANGIOGRAPHY;  Surgeon: Jolaine Artist, MD;  Location: [facility];  Service: Cardiovascular;  Laterality: N/A;   TONSILLECTOMY      Current Outpatient Medications  Medication Sig Dispense Refill   acetaminophen (TYLENOL) 500 MG tablet Take 1,000 mg by mouth every 6 (six) hours as needed for moderate pain or headache.     albuterol (VENTOLIN HFA) 108 (90 Base) MCG/ACT inhaler Inhale 1-2 puffs into the lungs every 6 (six) hours as needed for wheezing or shortness of breath.     atorvastatin (LIPITOR) 40 MG tablet Take 1 tablet (40 mg total) by mouth daily. 90 tablet 3   carvedilol (COREG) 25  MG tablet Take 1 tablet (25 mg total) by mouth 2 (two) times daily with a meal. ( DOSE INCREASE) Please keep November appointment for future refills. Thank you. 180 tablet 0   ELIQUIS 2.5 MG TABS tablet TAKE 1 TABLET TWICE DAILY 180 tablet 1   famotidine (PEPCID) 40 MG tablet Take 40 mg by mouth every evening.      furosemide (LASIX) 40 MG tablet TAKE 1 TABLET EVERY DAY AS NEEDED FOR EDEMA 90 tablet 3   hydrALAZINE (APRESOLINE) 25 MG tablet TAKE 1/2 TABLET BY MOUTH IN THE MORNING AND AT BEDTIME 90 tablet 3   isosorbide mononitrate (IMDUR) 30 MG 24 hr tablet Take 1 tablet (30 mg total) by mouth daily. Please keep November appointment for future refills. Thank you. 90 tablet 0   mexiletine (MEXITIL) 150 MG  capsule TAKE 2 CAPSULES BY MOUTH EVERY 12 HOURS. 360 capsule 2   Multiple Vitamin (MULTIVITAMIN WITH MINERALS) TABS tablet Take 1 tablet by mouth daily.      pantoprazole (PROTONIX) 40 MG tablet Take 40 mg by mouth daily.     sodium bicarbonate 650 MG tablet Take 650 mg by mouth daily.     sotalol (BETAPACE) 120 MG tablet TAKE 1 TABLET EVERY DAY 90 tablet 1   VASCEPA 1 g capsule TAKE 2 CAPSULES BY MOUTH 2 TIMES DAILY. 360 capsule 3   No current facility-administered medications for this visit.    Allergies  Allergen Reactions   Ranolazine Other (See Comments)    States he went into a coma and was in hospital for 3 days after taking   Procaine Swelling and Other (See Comments)   Amiodarone Nausea And Vomiting and Other (See Comments)    Social History   Socioeconomic History   Marital status: Married    Spouse name: Not on file   Number of children: Not on file   Years of education: Not on file   Highest education level: Not on file  Occupational History   Not on file  Tobacco Use   Smoking status: Never   Smokeless tobacco: Never   Tobacco comments:    Never smoke 04/17/22  Vaping Use   Vaping Use: Never used  Substance and Sexual Activity   Alcohol use: Not Currently    Comment: 04/22/2015 "nothing since the 1980s; never had a problem w/it"   Drug use: No   Sexual activity: Not Currently  Other Topics Concern   Not on file  Social History Narrative   Married, lives with spouse [name]   4 children, 2 boys and 2 girls > one daughter passed   OCCUPATION: retired, Pension scheme manager for [employer] Strain: Not on [employer] Insecurity: Not on file  Transportation Needs: Not on file  Physical Activity: Not on file  Stress: Not on file  Social Connections: Not on file  Intimate Partner Violence: Not on file    Family History  Problem Relation Age of Onset   Alzheimer's disease Mother    COPD Daughter     Alzheimer's disease Other    COPD Sister    Depression Sister    Heart disease Sister    Hyperlipidemia Sister    Hypertension Sister    Esophageal cancer Sister        + smoker    ROS- All systems are reviewed and negative except as per the HPI above  Physical Exam: There were no vitals filed for  this visit.  Wt Readings from Last 3 Encounters:  04/17/22 89.6 kg  04/04/22 85.3 kg  03/19/22 88.6 kg    Labs: Lab Results  Component Value Date   NA 134 12/06/2021   K 4.9 12/06/2021   CL 98 12/06/2021   CO2 21 12/06/2021   GLUCOSE 108 (H) 12/06/2021   BUN 31 (H) 12/06/2021   CREATININE 2.26 (H) 12/06/2021   CALCIUM 8.6 12/06/2021   PHOS 2.4 (L) 09/14/2019   MG 2.6 (H) 11/28/2021   Lab Results  Component Value Date   INR 1.55 07/26/2017   Lab Results  Component Value Date   CHOL 106 06/27/2021   HDL 38 (L) 06/27/2021   LDLCALC 37 06/27/2021   TRIG 193 (H) 06/27/2021    GEN- The patient is well appearing, alert and oriented x 3 today.   Head- normocephalic, atraumatic Eyes-  Sclera clear, conjunctiva pink Ears- hearing intact Oropharynx- clear Neck- supple, no JVP Lymph- no cervical lymphadenopathy Lungs- Clear to ausculation bilaterally, normal work of breathing Heart- regular rate and rhythm, no murmurs, rubs or gallops, PMI not laterally displaced GI- soft, NT, ND, + BS Extremities- no clubbing, cyanosis, or edema MS- no significant deformity or atrophy Skin- no rash or lesion Psych- euthymic mood, full affect Neuro- strength and sensation are intact  EKG-Ventricular rate 96 bpm PR interval * ms QRS duration 156 ms QT/QTcB 446/563 ms Ventricular paced rhythm Biventricular pacemaker detected Abnormal ECG    Assessment and Plan:  1. Afib Recent episodes of Afib on most recent remote pacemaker check.  Continue carvedilol 25 mg bid. and sotalol 120 mg once daily  Discussed patient case with Dr. Curt Bears. After review of limited options with  patient, he would like to proceed with scheduling DCCV to get him back in normal rhythm and see how long it lasts.  Given history of amiodarone discontinuation due to adverse effects in 2019 and low creatinine clearance, he has no other medication choices other than what he is currently taking. If DCCV is unsuccessful or he quickly goes back into Afib, he may have to reside in permanent Afib.  2. CHA2DS2VASc  score of at least 5 Continue eliquis 5 mg bid.  No missed doses of eliquis.   Will schedule DCCV.  Geroge Baseman Namiko Pritts, [facility] [address] [place], [place] [address] [phone]

## 2022-06-25 NOTE — Patient Instructions (Signed)
   Cardioversion scheduled for: Friday, April 5th   - Arrive at the [facility] and go to admitting at   - Do not eat or drink anything after midnight the night prior to your procedure.   - Take all your morning medication (except diabetic medications) with a sip of water prior to arrival.  - You will not be able to drive home after your procedure.    - Do NOT miss any doses of your blood thinner - if you should miss a dose please notify our office immediately.   - If you feel as if you go back into normal rhythm prior to scheduled cardioversion, please notify our office immediately.   If your procedure is canceled in the cardioversion suite you will be charged a cancellation fee.

## 2022-07-02 ENCOUNTER — Encounter (HOSPITAL_BASED_OUTPATIENT_CLINIC_OR_DEPARTMENT_OTHER): Payer: Self-pay | Admitting: Cardiology

## 2022-07-02 ENCOUNTER — Other Ambulatory Visit: Payer: Self-pay | Admitting: Cardiology

## 2022-07-02 ENCOUNTER — Other Ambulatory Visit (HOSPITAL_COMMUNITY): Payer: Self-pay | Admitting: Cardiology

## 2022-07-02 ENCOUNTER — Telehealth: Payer: Self-pay

## 2022-07-02 DIAGNOSIS — Z01812 Encounter for preprocedural laboratory examination: Secondary | ICD-10-CM

## 2022-07-02 DIAGNOSIS — I5022 Chronic systolic (congestive) heart failure: Secondary | ICD-10-CM

## 2022-07-02 DIAGNOSIS — I4819 Other persistent atrial fibrillation: Secondary | ICD-10-CM

## 2022-07-02 NOTE — Telephone Encounter (Signed)
Prescription refill request for Eliquis received. Indication: Afib  Last office visit: 06/25/22 [name])  Scr: 2.26 (12/06/21)  Age: 89 Weight: 90kg  Appropriate dose. Refill sent.

## 2022-07-02 NOTE — Telephone Encounter (Signed)
Alert received from [employer]:  Device alert for RRT reached 3/25 - route to triage Persistent AF/AFL, controlled rates, Eliquis HF diagnostics currently abnormal  Pt calling in to device clinic to advise his device had reached RRT.  He states that he feels weak.  Pt recently seen by afib clinic and scheduled for soonest DCCV available. Outreach  made to endoscopy who advised Pt could be scheduled for tomorrow.  Advised Pt of above.  He was not interested in moving up his DCCV.   Advised he would get a call from scheduler to schedule gen change after his DCCV.  Advised he had 3 months left to get that scheduled.

## 2022-07-03 ENCOUNTER — Telehealth: Payer: Self-pay | Admitting: *Deleted

## 2022-07-03 NOTE — Telephone Encounter (Signed)
Per Dentist office, no  urgent needs, pt not scheduled for anything at the moment.  They are just wanting to know if pt requires Pre-Med for dental appointments.

## 2022-07-03 NOTE — Telephone Encounter (Signed)
   Primary Cardiologist: [name], MD  Chart reviewed as part of pre-operative protocol coverage. Simple dental extractions are considered low risk procedures per guidelines and generally do not require any specific cardiac clearance. It is also generally accepted that for simple extractions and dental cleanings, there is no need to interrupt blood thinner therapy.   Request to hold blood thinners will need to be sent separately with information regarding the specific procedure. He is scheduled to undergo cardioversion on 07/13/22 and therefore will not be able to hold anticoagulation for 4 weeks post-cardioversion.  SBE prophylaxis is required for the patient.  I will route this recommendation to the requesting party via Epic fax function and remove from pre-op pool.  Please call with questions.  Emmaline Life, NP-C  07/03/2022, 3:55 PM [address] Office [phone] Fax [phone]

## 2022-07-03 NOTE — Telephone Encounter (Signed)
   Pre-operative Risk Assessment    Patient Name: [name]  DOB: 1933/03/15 MRN: [id number]     Request for Surgical Clearance    Procedure:  Dental Extraction - Amount of Teeth to be Pulled:  NOT SURE WHAT PT'S HAVING DONE, THEIR QUESTION ON THE CLEARANCE IS DOES PT NEED PREMED FOR DENTAL APPOINTMENTS?  I LEFT A MESSAGE FOR THE DENTIST OFFICE TO CALL BACK, AS THEY ARE CLOSED UNTIL 07/09/22 FOR SPRING BREAK  Date of Surgery:  Clearance TBD                                 Surgeon:   Surgeon's Group or Practice Name:  Ena Dawley, DDS Phone number:  [phone] Fax number:  [phone]   Type of Clearance Requested:   - Medical    Type of Anesthesia:  Not Indicated   Additional requests/questions:    [name]   07/03/2022, 2:07 PM

## 2022-07-04 NOTE — Progress Notes (Signed)
Remote ICD transmission.   

## 2022-07-05 ENCOUNTER — Other Ambulatory Visit (HOSPITAL_COMMUNITY): Payer: Self-pay | Admitting: Nurse Practitioner

## 2022-07-05 DIAGNOSIS — I4819 Other persistent atrial fibrillation: Secondary | ICD-10-CM | POA: Diagnosis not present

## 2022-07-06 LAB — BASIC METABOLIC PANEL
BUN/Creatinine Ratio: 13 (ref 10–24)
BUN: 30 mg/dL — ABNORMAL HIGH (ref 8–27)
CO2: 22 mmol/L (ref 20–29)
Calcium: 9 mg/dL (ref 8.6–10.2)
Chloride: 98 mmol/L (ref 96–106)
Creatinine, Ser: 2.32 mg/dL — ABNORMAL HIGH (ref 0.76–1.27)
Glucose: 134 mg/dL — ABNORMAL HIGH (ref 70–99)
Potassium: 4.6 mmol/L (ref 3.5–5.2)
Sodium: 135 mmol/L (ref 134–144)
eGFR: 27 mL/min/{1.73_m2} — ABNORMAL LOW (ref >59)

## 2022-07-06 LAB — CBC
Hematocrit: 43.1 % (ref 37.5–51.0)
Hemoglobin: 14.1 g/dL (ref 13.0–17.7)
MCH: 29.5 pg (ref 26.6–33.0)
MCHC: 32.7 g/dL (ref 31.5–35.7)
MCV: 90 fL (ref 79–97)
Platelets: 302 10*3/uL (ref 150–450)
RBC: 4.78 x10E6/uL (ref 4.14–5.80)
RDW: 13 % (ref 11.6–15.4)
WBC: 8.6 10*3/uL (ref 3.4–10.8)

## 2022-07-06 LAB — MAGNESIUM: Magnesium: 2.3 mg/dL (ref 1.6–2.3)

## 2022-07-09 ENCOUNTER — Ambulatory Visit: Payer: Medicare HMO | Attending: Internal Medicine

## 2022-07-09 ENCOUNTER — Telehealth: Payer: Self-pay

## 2022-07-09 DIAGNOSIS — I472 Ventricular tachycardia, unspecified: Secondary | ICD-10-CM

## 2022-07-09 LAB — CUP PACEART INCLINIC DEVICE CHECK
Battery Remaining Longevity: 2 mo
Battery Voltage: 2.65 V
Brady Statistic AP VP Percent: 74.05 %
Brady Statistic AP VS Percent: 0.2 %
Brady Statistic AS VP Percent: 23.54 %
Brady Statistic AS VS Percent: 2.22 %
Brady Statistic RA Percent Paced: 71.8 %
Brady Statistic RV Percent Paced: 97.57 %
Date Time Interrogation Session: 20240401230815
HighPow Impedance: 75 Ohm
Implantable Lead Connection Status: 753985
Implantable Lead Connection Status: 753985
Implantable Lead Connection Status: 753985
Implantable Lead Implant Date: 20170113
Implantable Lead Implant Date: 20170113
Implantable Lead Implant Date: 20170113
Implantable Lead Location: 753858
Implantable Lead Location: 753859
Implantable Lead Location: 753860
Implantable Lead Model: 4298
Implantable Lead Model: 5076
Implantable Pulse Generator Implant Date: 20170113
Lead Channel Impedance Value: 1026 Ohm
Lead Channel Impedance Value: 380 Ohm
Lead Channel Impedance Value: 399 Ohm
Lead Channel Impedance Value: 399 Ohm
Lead Channel Impedance Value: 437 Ohm
Lead Channel Impedance Value: 456 Ohm
Lead Channel Impedance Value: 513 Ohm
Lead Channel Impedance Value: 513 Ohm
Lead Channel Impedance Value: 570 Ohm
Lead Channel Impedance Value: 817 Ohm
Lead Channel Impedance Value: 817 Ohm
Lead Channel Impedance Value: 893 Ohm
Lead Channel Impedance Value: 912 Ohm
Lead Channel Pacing Threshold Amplitude: 0.375 V
Lead Channel Pacing Threshold Amplitude: 0.625 V
Lead Channel Pacing Threshold Amplitude: 0.75 V
Lead Channel Pacing Threshold Pulse Width: 0.4 ms
Lead Channel Pacing Threshold Pulse Width: 0.4 ms
Lead Channel Pacing Threshold Pulse Width: 0.4 ms
Lead Channel Sensing Intrinsic Amplitude: 0.75 mV
Lead Channel Sensing Intrinsic Amplitude: 0.75 mV
Lead Channel Sensing Intrinsic Amplitude: 10.875 mV
Lead Channel Sensing Intrinsic Amplitude: 10.875 mV
Lead Channel Setting Pacing Amplitude: 1.5 V
Lead Channel Setting Pacing Amplitude: 1.75 V
Lead Channel Setting Pacing Amplitude: 2 V
Lead Channel Setting Pacing Pulse Width: 0.4 ms
Lead Channel Setting Pacing Pulse Width: 0.4 ms
Lead Channel Setting Sensing Sensitivity: 0.3 mV
Zone Setting Status: 755011

## 2022-07-09 NOTE — Progress Notes (Signed)
CRT-D device check in office. Thresholds and sensing consistent with previous device measurements. Lead impedance trends stable over time. AT/AF burden 25.8%. 1 VT event on 07/09/2022 @ 12:20 , 5 ATP & 1 HV successful shock. Reviewed with Dr. Caryl Comes, appear atrial driven. No new orders obtained. Patient bi-ventricularly pacing 97.6% of the time. Device programmed with appropriate safety margins. Heart failure diagnostics reviewed and trends are stable for patient. Audible/vibratory alerts demonstrated for patient. No changes made this session. Estimated longevity ICD reached RRT 07/02/22.  Patient enrolled in remote follow up. Patient education completed including shock plan. Discussed [place] DMV driving restrictions x6 month w/ verbal understanding.

## 2022-07-09 NOTE — Telephone Encounter (Signed)
Patient reports he suddenly felt weak and like he was going to pass out then a shock. Denies any symptoms after shock, states he feels fine now. Patient advised not to drive to office, have someone else drive him. Also advised of shock plan. Voiced understanding. Reviewed with Dr. Caryl Comes and agrees to see him in device today.

## 2022-07-09 NOTE — Telephone Encounter (Signed)
The patient states he was feeling weak and sat down. He then got shocked by his ICD. I tried to get a transmission with his home remote monitor. I was unsuccessful. The patient will be here today at 3:00 pm to get his device interrogated.

## 2022-07-13 ENCOUNTER — Encounter (HOSPITAL_COMMUNITY)
Admission: RE | Disposition: A | Discharge status: Home / Self Care | Payer: Self-pay | Source: Home / Self Care | Attending: Internal Medicine

## 2022-07-13 ENCOUNTER — Ambulatory Visit (HOSPITAL_COMMUNITY): Payer: Medicare HMO | Admitting: Certified Registered Nurse Anesthetist

## 2022-07-13 ENCOUNTER — Other Ambulatory Visit: Payer: Self-pay

## 2022-07-13 ENCOUNTER — Ambulatory Visit (HOSPITAL_COMMUNITY)
Admission: RE | Admit: 2022-07-13 | Discharge: 2022-07-13 | Disposition: A | Discharge status: Home / Self Care | Payer: Medicare HMO | Attending: Internal Medicine | Admitting: Internal Medicine

## 2022-07-13 DIAGNOSIS — Z538 Procedure and treatment not carried out for other reasons: Secondary | ICD-10-CM | POA: Diagnosis not present

## 2022-07-13 DIAGNOSIS — I4891 Unspecified atrial fibrillation: Secondary | ICD-10-CM | POA: Diagnosis not present

## 2022-07-13 SURGERY — CANCELLED PROCEDURE

## 2022-07-13 MED ORDER — SODIUM CHLORIDE 0.9 % IV SOLN: INTRAVENOUS | Status: DC

## 2022-07-13 NOTE — Progress Notes (Signed)
Device interrogated prior to cardioversion. Patient has been in SR since device therapy on 07/09/22. Cardioversion cancelled. APVP on interrogation, with underlying sinus bradycardia. Assistance from [employer] rep appreciated.

## 2022-07-19 ENCOUNTER — Ambulatory Visit (HOSPITAL_COMMUNITY)
Admission: RE | Admit: 2022-07-19 | Discharge: 2022-07-19 | Disposition: A | Discharge status: Home / Self Care | Payer: Medicare HMO | Source: Ambulatory Visit | Attending: Internal Medicine | Admitting: Internal Medicine

## 2022-07-19 VITALS — BP 146/88 | HR 90 | Ht 72.0 in | Wt 196.6 lb

## 2022-07-19 DIAGNOSIS — I4819 Other persistent atrial fibrillation: Secondary | ICD-10-CM | POA: Insufficient documentation

## 2022-07-19 DIAGNOSIS — I493 Ventricular premature depolarization: Secondary | ICD-10-CM | POA: Diagnosis not present

## 2022-07-19 DIAGNOSIS — D6869 Other thrombophilia: Secondary | ICD-10-CM

## 2022-07-19 NOTE — Progress Notes (Signed)
Primary Care Physician: Joycelyn Rua, MD Referring Physician: Dr. Kem Boroughs is a 89 y.o. male with a h/o  hypertension, aortic stenosis post AVR, chronic systolic heart failure due to ischemic cardiomyopathy, persistent atrial fibrillation/flutter, coronary artery disease post CABG.  He has past [employer] CRT-D with an upgrade 04/22/2015.  He is on sotalol and mexiletine for ventricular tachycardia.  Seen on 04/17/22. He was seen by Dr. Elberta Fortis 12/11 and was found to be persistent afib/flutter. He was set up for cardioversion 12/27 which was successful and is AV paced today. He does not feel any different since cardioversion. Continue carvedilol 25 mg BID and sotalol 120 mg BID.   On follow up 06/25/22, scheduled remote pacemaker interrogation showed frequent episodes of AF since 3/7. Two episodes were treated successfully with ATP. Optivol also indicated fluid on board. He reported feeling weak and having increase in SOB for 2 weeks. Review of records show patient was confused as to sotalol dosing and 3/11 discovered he was taking BID and advised to switch back to once daily.   He notes to currently not feel as tired but can easily get out of breath. He notes that the past 6 or so episodes of Afib he could not feel it but that perhaps could feel most recent episode. He is compliant with his medications and has no bleeding concerns. He is now taking sotalol 120 mg daily. Denies chest pain or swelling of lower extremities.   On follow up 07/19/22, he appears to be in a ventricular paced rhythm with underlying Afib. He was scheduled for DCCV on 4/5 but felt weak and like he was going to pass out on 4/1 resulting in a shock being delivered by his device due to 1 VT event. He had his device interrogated on day of DCCV and was noted to be in SR since device therapy on 4/1 so cardioversion was cancelled. APVP on interrogation with underlying sinus bradycardia was noted.   He states the  afternoon after the shock he felt wonderful. He has remained compliant with his sotalol, mexiletine, and sotalol. He confirms that he is taking sotalol 120 mg once daily (previously taking different dose by mistake). He notes to overall feel okay and cannot readily feel that he has gone back into Afib within the past 6 days. He is not actively tired but states it does not take much to get him to feel tired. He is planning on planting several rows of plants in his garden and paces himself.    Today, he denies symptoms of palpitations, chest pain, orthopnea, PND, lower extremity edema, dizziness, presyncope, syncope, or neurologic sequela. The patient is tolerating medications without difficulties and is otherwise without complaint today.   Past Medical History:  Diagnosis Date   Arthritis    "minor everywhere" (04/22/2015)   Asthma    "a touch"   Barrett esophagus    CAD (coronary artery disease)    a. s/p 3 vvessel CABG with LIMA to LAD, SVG to diagonal 1, SVG to RCA 12/09.   Cardiac resynchronization therapy defibrillator (CRT-D) in place    Chronic anticoagulation 01/23/2013   Chronic combined systolic and diastolic CHF (congestive heart failure) (HCC)    dry weight 197-200lbs.   CKD (chronic kidney disease) stage 3, GFR 30-59 ml/min (HCC) 08/23/2014   Complication of anesthesia    DCM (dilated cardiomyopathy) (HCC)    initially concerned for TTP amyloidosis but Tc-PYP scan 06/2017 was not consistent with  amyloid and SPEP/UPEP with no M spike. EF 20-25% by echo 2019   Diverticulitis 08/2019   Dyslipidemia 01/23/2013   Essential hypertension 01/23/2013   GERD (gastroesophageal reflux disease)    GI bleed 05/08/2017   H/O: rheumatic fever    Hepatitis 1957   "don't know what kind:   History of hiatal hernia    History of PFTs    a. Amiodarone started 5/16 >> PFTs w/ DLCO 6/16:  FEV1 72% predicted, FEV1/FVC 66%, DLCO 66% >> minimal reversible obstructive airways disease with mild diffusion  defect (suggestive of emphysema but absence of hyperinflation inconsistent with dx)   Hyperkalemia 08/23/2014   Hyperthyroidism 05/26/2017   Leg weakness 01/28/2017   Multiple thyroid nodules 08/13/2017   Orthostatic hypotension    Persistent atrial fibrillation (HCC) 01/23/2013   Pleural effusion on right 05/26/2017   Pneumonia 08/2014   "dr thought I may have had a touch"   Pre-diabetes    Protein-calorie malnutrition, severe 05/27/2017   S/P AVR (aortic valve replacement) 2009   a. severe AS s/p AVR with pericardial tissue valve 2009.   Shingles 08/23/2014   VT (ventricular tachycardia) (HCC) 05/06/2017   Past Surgical History:  Procedure Laterality Date   AORTIC VALVE REPLACEMENT  with 23-mm Magna Ease pericardial valve, model number   with 23-mm Magna Ease pericardial valve, model [id number], serial number [id number]    APPENDECTOMY  1940s   BI-VENTRICULAR IMPLANTABLE CARDIOVERTER DEFIBRILLATOR  (CRT-D)  04/22/2015   CARDIAC CATHETERIZATION N/A 11/04/2014   Procedure: Left Heart Cath and Cors/Grafts Angiography;  Surgeon: Marykay Lex, MD;  Location: [facility] INVASIVE CV LAB;  Service: Cardiovascular;  Laterality: N/A;   CARDIAC VALVE REPLACEMENT     CARDIOVERSION N/A 01/27/2013   Procedure: CARDIOVERSION;  Surgeon: Quintella Reichert, MD;  Location: [facility] ENDOSCOPY;  Service: Cardiovascular;  Laterality: N/A;   CARDIOVERSION N/A 08/25/2014   Procedure: CARDIOVERSION;  Surgeon: Thurmon Fair, MD;  Location: [facility] ENDOSCOPY;  Service: Cardiovascular;  Laterality: N/A;   CARDIOVERSION N/A 05/28/2018   Procedure: CARDIOVERSION;  Surgeon: Chrystie Nose, MD;  Location: [facility] ENDOSCOPY;  Service: Cardiovascular;  Laterality: N/A;   CARDIOVERSION N/A 10/28/2019   Procedure: CARDIOVERSION;  Surgeon: Quintella Reichert, MD;  Location: [facility] ENDOSCOPY;  Service: Cardiovascular;  Laterality: N/A;   CARDIOVERSION N/A 09/01/2020   Procedure: CARDIOVERSION;  Surgeon: Lewayne Bunting, MD;  Location: [facility] ENDOSCOPY;   Service: Cardiovascular;  Laterality: N/A;   CARDIOVERSION N/A 04/04/2022   Procedure: CARDIOVERSION;  Surgeon: Chrystie Nose, MD;  Location: [facility] ENDOSCOPY;  Service: Cardiovascular;  Laterality: N/A;   CATARACT EXTRACTION W/ INTRAOCULAR LENS  IMPLANT, BILATERAL Bilateral    CHEST TUBE INSERTION Right 07/26/2017   Procedure: INSERTION PLEURAL DRAINAGE CATHETER - RIGHT;  Surgeon: Kerin Perna, MD;  Location: [facility] OR;  Service: Thoracic;  Laterality: Right;   CORONARY ARTERY BYPASS GRAFT  03/10/2008   x 3 Dr. Dorris Fetch   EP IMPLANTABLE DEVICE N/A 04/22/2015   Procedure: BiV ICD Insertion CRT-D;  Surgeon: Will Jorja Loa, MD;  Location: [facility] INVASIVE CV LAB;  Service: Cardiovascular;  Laterality: N/A;   ESOPHAGOGASTRODUODENOSCOPY (EGD) WITH ESOPHAGEAL DILATION  X1   ESOPHAGOGASTRODUODENOSCOPY (EGD) WITH PROPOFOL N/A 05/09/2017   Procedure: ESOPHAGOGASTRODUODENOSCOPY (EGD) WITH PROPOFOL;  Surgeon: Jeani Hawking, MD;  Location[facility] ENDOSCOPY;  Service: Endoscopy;  Laterality: N/A;   IR THORACENTESIS ASP PLEURAL SPACE W/IMG GUIDE  05/27/2017   IR THORACENTESIS ASP PLEURAL SPACE W/IMG GUIDE  06/17/2017   IR THORACENTESIS ASP PLEURAL SPACE W/IMG  GUIDE  06/18/2017   IR THORACENTESIS ASP PLEURAL SPACE W/IMG GUIDE  07/11/2017   REMOVAL OF PLEURAL DRAINAGE CATHETER Right 09/13/2017   Procedure: REMOVAL OF PLEURAL DRAINAGE CATHETER;  Surgeon: Kerin Perna, MD;  Location: [facility] OR;  Service: Thoracic;  Laterality: Right;   RIGHT HEART CATH AND CORONARY/GRAFT ANGIOGRAPHY N/A 05/31/2017   Procedure: RIGHT HEART CATH AND CORONARY/GRAFT ANGIOGRAPHY;  Surgeon: Dolores Patty, MD;  Location: [facility] INVASIVE CV LAB;  Service: Cardiovascular;  Laterality: N/A;   TONSILLECTOMY      Current Outpatient Medications  Medication Sig Dispense Refill   acetaminophen (TYLENOL) 500 MG tablet Take 1,000 mg by mouth every 6 (six) hours as needed for moderate pain or headache.     albuterol (VENTOLIN HFA) 108 (90 Base) MCG/ACT  inhaler Inhale 1-2 puffs into the lungs every 6 (six) hours as needed for wheezing or shortness of breath.     atorvastatin (LIPITOR) 40 MG tablet TAKE 1 TABLET EVERY DAY (Patient taking differently: Take 40 mg by mouth daily in the afternoon.) 90 tablet 0   carvedilol (COREG) 25 MG tablet Take 1 tablet (25 mg total) by mouth 2 (two) times daily with a meal. ( DOSE INCREASE) Please keep November appointment for future refills. Thank you. 180 tablet 0   ELIQUIS 2.5 MG TABS tablet TAKE 1 TABLET TWICE DAILY 180 tablet 3   famotidine (PEPCID) 40 MG tablet Take 40 mg by mouth every evening. 20 mg- Tuesday through Saturday  - only on Mondays     furosemide (LASIX) 40 MG tablet TAKE 1 TABLET EVERY DAY AS NEEDED FOR EDEMA (Patient taking differently: Take 20 mg by mouth daily.) 90 tablet 3   hydrALAZINE (APRESOLINE) 25 MG tablet TAKE 1/2 TABLET BY MOUTH IN THE MORNING AND AT BEDTIME 90 tablet 3   isosorbide mononitrate (IMDUR) 30 MG 24 hr tablet Take 1 tablet (30 mg total) by mouth daily. Please keep November appointment for future refills. Thank you. (Patient taking differently: Take 30 mg by mouth daily in the afternoon. Please keep November appointment for future refills. Thank you.) 90 tablet 0   mexiletine (MEXITIL) 150 MG capsule TAKE 2 CAPSULES BY MOUTH EVERY 12 HOURS. 360 capsule 2   Multiple Vitamin (MULTIVITAMIN WITH MINERALS) TABS tablet Take 1 tablet by mouth daily.      pantoprazole (PROTONIX) 40 MG tablet Take 40 mg by mouth daily.     Polyethyl Glycol-Propyl Glycol (SYSTANE OP) Place 1 drop into both eyes daily as needed (dry eyes).     sodium bicarbonate 650 MG tablet Take 650 mg by mouth daily.     sotalol (BETAPACE) 120 MG tablet TAKE 1 TABLET EVERY DAY 90 tablet 1   VASCEPA 1 g capsule TAKE 2 CAPSULES BY MOUTH 2 TIMES DAILY. 360 capsule 3   No current facility-administered medications for this encounter.    Allergies  Allergen Reactions   Ranolazine Other (See Comments)     States he went into a coma and was in hospital for 3 days after taking   Procaine Swelling and Other (See Comments)   Amiodarone Nausea And Vomiting and Other (See Comments)    Social History   Socioeconomic History   Marital status: Married    Spouse name: Not on file   Number of children: Not on file   Years of education: Not on file   Highest education level: Not on file  Occupational History   Not on file  Tobacco Use   Smoking status:  Never   Smokeless tobacco: Never   Tobacco comments:    Never smoke 04/17/22  Vaping Use   Vaping Use: Never used  Substance and Sexual Activity   Alcohol use: Not Currently    Comment: 04/22/2015 "nothing since the 1980s; never had a problem w/it"   Drug use: No   Sexual activity: Not Currently  Other Topics Concern   Not on file  Social History Narrative   Married, lives with spouse Anselmo Rod   4 children, 2 boys and 2 girls > one daughter passed   OCCUPATION: retired, Nurse, learning disability for [employer]   Social Determinants of Health   Financial Resource Strain: Not on file  Food Insecurity: Not on file  Transportation Needs: Not on file  Physical Activity: Not on file  Stress: Not on file  Social Connections: Not on file  Intimate Partner Violence: Not on file    Family History  Problem Relation Age of Onset   Alzheimer's disease Mother    COPD Daughter    Alzheimer's disease Other    COPD Sister    Depression Sister    Heart disease Sister    Hyperlipidemia Sister    Hypertension Sister    Esophageal cancer Sister        + smoker    ROS- All systems are reviewed and negative except as per the HPI above  Physical Exam: Vitals:   07/19/22 1320  BP: (!) 146/88  Pulse: 90  Weight: 89.2 kg  Height: 6' (1.829 m)    Wt Readings from Last 3 Encounters:  07/19/22 89.2 kg  07/13/22 85.7 kg  06/25/22 90 kg    Labs: Lab Results  Component Value Date   NA 135 07/05/2022   K 4.6 07/05/2022   CL 98  07/05/2022   CO2 22 07/05/2022   GLUCOSE 134 (H) 07/05/2022   BUN 30 (H) 07/05/2022   CREATININE 2.32 (H) 07/05/2022   CALCIUM 9.0 07/05/2022   PHOS 2.4 (L) 09/14/2019   MG 2.3 07/05/2022   Lab Results  Component Value Date   INR 1.55 07/26/2017   Lab Results  Component Value Date   CHOL 106 06/27/2021   HDL 38 (L) 06/27/2021   LDLCALC 37 06/27/2021   TRIG 193 (H) 06/27/2021    GEN- The patient is well appearing, alert and oriented x 3 today.   Head- normocephalic, atraumatic Eyes-  Sclera clear, conjunctiva pink Ears- hearing intact Oropharynx- clear Neck- supple, no JVP Lymph- no cervical lymphadenopathy Lungs- Clear to ausculation bilaterally, normal work of breathing Heart- Irregular rate and rhythm, no murmurs, rubs or gallops, PMI not laterally displaced GI- soft, NT, ND, + BS Extremities- no clubbing, cyanosis, or edema MS- no significant deformity or atrophy Skin- no rash or lesion Psych- euthymic mood, full affect Neuro- strength and sensation are intact   EKG-Ventricular rate 90 bpm PR interval * ms QRS duration 144 ms QT/QTcB 430/526 ms Ventricular paced rhythm Biventricular pacemaker detected Abnormal ECG  Appears to be underlying atrial fibrillation (lack of clear atrial activity and irregular)   Assessment and Plan:  1. Afib S/p shock by device on 4/1 due to VT. He was scheduled for DCCV on 4/5 but cancelled due to being in SR.  He appears to be back in Afib today. Previous conversation with Dr. Elberta Fortis to offer DCCV and if unsuccessful may have to reside in permanent Afib. This is secondary to his history of amiodarone discontinuation due to adverse effects in 2019 and low  creatinine clearance.   Due to this, it would appear he will remain in Afib.   Continue medication regimen without change.  F/u Afib clinic prn.  2. CHA2DS2VASc  score of at least 5 Continue eliquis 5 mg bid.  No missed doses.  He is on recall list to see EP PA. F/u  afib clinic prn  Lake BellsJoseph Tamyka Bezio, PA-C [facility] [address] [place], [place] [address] [phone]

## 2022-07-23 ENCOUNTER — Encounter: Payer: Self-pay | Admitting: *Deleted

## 2022-07-23 ENCOUNTER — Ambulatory Visit (INDEPENDENT_AMBULATORY_CARE_PROVIDER_SITE_OTHER): Payer: Medicare HMO

## 2022-07-23 ENCOUNTER — Other Ambulatory Visit: Payer: Self-pay | Admitting: *Deleted

## 2022-07-23 DIAGNOSIS — Z01812 Encounter for preprocedural laboratory examination: Secondary | ICD-10-CM

## 2022-07-23 DIAGNOSIS — I4819 Other persistent atrial fibrillation: Secondary | ICD-10-CM

## 2022-07-23 DIAGNOSIS — I5022 Chronic systolic (congestive) heart failure: Secondary | ICD-10-CM

## 2022-07-23 NOTE — Telephone Encounter (Signed)
Scheduled gen change for 6/11. Briefly reviewed instructions. Pt will stop by a [facility] for blook work closer to procedure date. Aware sending instruction letter via  mychat. Will also mail to home address on file, per pt request. Pt agreeable to plan.

## 2022-07-24 ENCOUNTER — Encounter: Payer: Self-pay | Admitting: Cardiology

## 2022-07-24 LAB — CUP PACEART REMOTE DEVICE CHECK
Battery Remaining Longevity: 1 mo — CL
Battery Voltage: 2.68 V
Brady Statistic RA Percent Paced: 2.05 %
Brady Statistic RV Percent Paced: 92.34 %
Date Time Interrogation Session: 20240415023328
HighPow Impedance: 61 Ohm
Implantable Lead Connection Status: 753985
Implantable Lead Connection Status: 753985
Implantable Lead Connection Status: 753985
Implantable Lead Implant Date: 20170113
Implantable Lead Implant Date: 20170113
Implantable Lead Implant Date: 20170113
Implantable Lead Location: 753858
Implantable Lead Location: 753859
Implantable Lead Location: 753860
Implantable Lead Model: 4298
Implantable Lead Model: 5076
Implantable Pulse Generator Implant Date: 20170113
Lead Channel Impedance Value: 380 Ohm
Lead Channel Impedance Value: 380 Ohm
Lead Channel Impedance Value: 399 Ohm
Lead Channel Impedance Value: 399 Ohm
Lead Channel Impedance Value: 437 Ohm
Lead Channel Impedance Value: 494 Ohm
Lead Channel Impedance Value: 532 Ohm
Lead Channel Impedance Value: 532 Ohm
Lead Channel Impedance Value: 817 Ohm
Lead Channel Impedance Value: 817 Ohm
Lead Channel Impedance Value: 836 Ohm
Lead Channel Impedance Value: 855 Ohm
Lead Channel Impedance Value: 988 Ohm
Lead Channel Pacing Threshold Amplitude: 0.375 V
Lead Channel Pacing Threshold Amplitude: 0.625 V
Lead Channel Pacing Threshold Amplitude: 0.75 V
Lead Channel Pacing Threshold Pulse Width: 0.4 ms
Lead Channel Pacing Threshold Pulse Width: 0.4 ms
Lead Channel Pacing Threshold Pulse Width: 0.4 ms
Lead Channel Sensing Intrinsic Amplitude: 0.75 mV
Lead Channel Sensing Intrinsic Amplitude: 0.75 mV
Lead Channel Sensing Intrinsic Amplitude: 13.25 mV
Lead Channel Sensing Intrinsic Amplitude: 13.25 mV
Lead Channel Setting Pacing Amplitude: 1.5 V
Lead Channel Setting Pacing Amplitude: 1.75 V
Lead Channel Setting Pacing Amplitude: 2 V
Lead Channel Setting Pacing Pulse Width: 0.4 ms
Lead Channel Setting Pacing Pulse Width: 0.4 ms
Lead Channel Setting Sensing Sensitivity: 0.3 mV
Zone Setting Status: 755011

## 2022-07-24 NOTE — Addendum Note (Signed)
Addended by: Elease Etienne A on: 07/24/2022 03:57 PM   Modules accepted: Level of Service

## 2022-07-24 NOTE — Progress Notes (Signed)
Remote ICD transmission.   

## 2022-08-15 DIAGNOSIS — N184 Chronic kidney disease, stage 4 (severe): Secondary | ICD-10-CM | POA: Diagnosis not present

## 2022-08-23 ENCOUNTER — Ambulatory Visit (INDEPENDENT_AMBULATORY_CARE_PROVIDER_SITE_OTHER): Payer: Medicare HMO

## 2022-08-23 DIAGNOSIS — I255 Ischemic cardiomyopathy: Secondary | ICD-10-CM

## 2022-08-24 LAB — CUP PACEART REMOTE DEVICE CHECK
Battery Remaining Longevity: 1 mo — CL
Battery Voltage: 2.65 V
Brady Statistic RA Percent Paced: 0.32 %
Brady Statistic RV Percent Paced: 92.77 %
Date Time Interrogation Session: 20240516223625
HighPow Impedance: 66 Ohm
Implantable Lead Connection Status: 753985
Implantable Lead Connection Status: 753985
Implantable Lead Connection Status: 753985
Implantable Lead Implant Date: 20170113
Implantable Lead Implant Date: 20170113
Implantable Lead Implant Date: 20170113
Implantable Lead Location: 753858
Implantable Lead Location: 753859
Implantable Lead Location: 753860
Implantable Lead Model: 4298
Implantable Lead Model: 5076
Implantable Pulse Generator Implant Date: 20170113
Lead Channel Impedance Value: 1026 Ohm
Lead Channel Impedance Value: 342 Ohm
Lead Channel Impedance Value: 399 Ohm
Lead Channel Impedance Value: 399 Ohm
Lead Channel Impedance Value: 399 Ohm
Lead Channel Impedance Value: 437 Ohm
Lead Channel Impedance Value: 494 Ohm
Lead Channel Impedance Value: 532 Ohm
Lead Channel Impedance Value: 589 Ohm
Lead Channel Impedance Value: 817 Ohm
Lead Channel Impedance Value: 836 Ohm
Lead Channel Impedance Value: 893 Ohm
Lead Channel Impedance Value: 893 Ohm
Lead Channel Pacing Threshold Amplitude: 0.375 V
Lead Channel Pacing Threshold Amplitude: 0.625 V
Lead Channel Pacing Threshold Amplitude: 0.75 V
Lead Channel Pacing Threshold Pulse Width: 0.4 ms
Lead Channel Pacing Threshold Pulse Width: 0.4 ms
Lead Channel Pacing Threshold Pulse Width: 0.4 ms
Lead Channel Sensing Intrinsic Amplitude: 0.625 mV
Lead Channel Sensing Intrinsic Amplitude: 0.625 mV
Lead Channel Sensing Intrinsic Amplitude: 10.5 mV
Lead Channel Sensing Intrinsic Amplitude: 10.5 mV
Lead Channel Setting Pacing Amplitude: 1.5 V
Lead Channel Setting Pacing Amplitude: 1.75 V
Lead Channel Setting Pacing Amplitude: 2 V
Lead Channel Setting Pacing Pulse Width: 0.4 ms
Lead Channel Setting Pacing Pulse Width: 0.4 ms
Lead Channel Setting Sensing Sensitivity: 0.3 mV
Zone Setting Status: 755011

## 2022-08-28 NOTE — Progress Notes (Signed)
Remote ICD transmission.   

## 2022-08-28 NOTE — Addendum Note (Signed)
Addended by: Geralyn Flash D on: 08/28/2022 03:05 PM   Modules accepted: Level of Service

## 2022-08-30 DIAGNOSIS — K2270 Barrett's esophagus without dysplasia: Secondary | ICD-10-CM | POA: Diagnosis not present

## 2022-08-30 DIAGNOSIS — E1122 Type 2 diabetes mellitus with diabetic chronic kidney disease: Secondary | ICD-10-CM | POA: Diagnosis not present

## 2022-08-30 DIAGNOSIS — I5022 Chronic systolic (congestive) heart failure: Secondary | ICD-10-CM | POA: Diagnosis not present

## 2022-08-30 DIAGNOSIS — N1832 Chronic kidney disease, stage 3b: Secondary | ICD-10-CM | POA: Diagnosis not present

## 2022-08-30 DIAGNOSIS — E875 Hyperkalemia: Secondary | ICD-10-CM | POA: Diagnosis not present

## 2022-08-30 DIAGNOSIS — E872 Acidosis, unspecified: Secondary | ICD-10-CM | POA: Diagnosis not present

## 2022-08-30 DIAGNOSIS — I130 Hypertensive heart and chronic kidney disease with heart failure and stage 1 through stage 4 chronic kidney disease, or unspecified chronic kidney disease: Secondary | ICD-10-CM | POA: Diagnosis not present

## 2022-09-06 NOTE — Addendum Note (Signed)
Addended by: Elease Etienne A on: 09/06/2022 10:43 AM   Modules accepted: Level of Service

## 2022-09-06 NOTE — Progress Notes (Signed)
Remote ICD transmission.   

## 2022-09-07 DIAGNOSIS — I4819 Other persistent atrial fibrillation: Secondary | ICD-10-CM | POA: Diagnosis not present

## 2022-09-07 LAB — BASIC METABOLIC PANEL

## 2022-09-07 LAB — CBC
MCV: 91 fL (ref 79–97)
WBC: 9 10*3/uL (ref 3.4–10.8)

## 2022-09-11 LAB — CBC
Hematocrit: 42.4 % (ref 37.5–51.0)
Hemoglobin: 13.7 g/dL (ref 13.0–17.7)
MCH: 29.5 pg (ref 26.6–33.0)
MCHC: 32.3 g/dL (ref 31.5–35.7)
Platelets: 263 10*3/uL (ref 150–450)
RBC: 4.65 x10E6/uL (ref 4.14–5.80)
RDW: 12.8 % (ref 11.6–15.4)

## 2022-09-11 LAB — BASIC METABOLIC PANEL
BUN/Creatinine Ratio: 15 (ref 10–24)
BUN: 33 mg/dL — ABNORMAL HIGH (ref 8–27)
Sodium: 139 mmol/L (ref 134–144)

## 2022-09-17 ENCOUNTER — Telehealth: Payer: Self-pay

## 2022-09-17 NOTE — Telephone Encounter (Signed)
Pt aware of time change of procedure on 09/18/22. He will arrive at 11:30 AM.

## 2022-09-17 NOTE — Pre-Procedure Instructions (Signed)
Instructed patient on the following items: Arrival time 1130 Nothing to eat or drink after midnight No meds AM of procedure Responsible person to drive you home and stay with you for 24 hrs Wash with special soap night before and morning of procedure  

## 2022-09-18 ENCOUNTER — Ambulatory Visit (HOSPITAL_COMMUNITY)
Admission: RE | Disposition: A | Discharge status: Home / Self Care | Payer: Self-pay | Source: Home / Self Care | Attending: Cardiology

## 2022-09-18 ENCOUNTER — Ambulatory Visit (HOSPITAL_COMMUNITY)
Admission: RE | Admit: 2022-09-18 | Discharge: 2022-09-18 | Disposition: A | Discharge status: Home / Self Care | Payer: Medicare HMO | Attending: Cardiology | Admitting: Cardiology

## 2022-09-18 DIAGNOSIS — Z951 Presence of aortocoronary bypass graft: Secondary | ICD-10-CM | POA: Insufficient documentation

## 2022-09-18 DIAGNOSIS — I4819 Other persistent atrial fibrillation: Secondary | ICD-10-CM | POA: Diagnosis not present

## 2022-09-18 DIAGNOSIS — I5022 Chronic systolic (congestive) heart failure: Secondary | ICD-10-CM | POA: Insufficient documentation

## 2022-09-18 DIAGNOSIS — I130 Hypertensive heart and chronic kidney disease with heart failure and stage 1 through stage 4 chronic kidney disease, or unspecified chronic kidney disease: Secondary | ICD-10-CM | POA: Insufficient documentation

## 2022-09-18 DIAGNOSIS — Z4502 Encounter for adjustment and management of automatic implantable cardiac defibrillator: Secondary | ICD-10-CM | POA: Insufficient documentation

## 2022-09-18 DIAGNOSIS — I252 Old myocardial infarction: Secondary | ICD-10-CM | POA: Diagnosis not present

## 2022-09-18 DIAGNOSIS — I4892 Unspecified atrial flutter: Secondary | ICD-10-CM | POA: Diagnosis not present

## 2022-09-18 DIAGNOSIS — I2721 Secondary pulmonary arterial hypertension: Secondary | ICD-10-CM | POA: Insufficient documentation

## 2022-09-18 DIAGNOSIS — Z79899 Other long term (current) drug therapy: Secondary | ICD-10-CM | POA: Insufficient documentation

## 2022-09-18 DIAGNOSIS — I2510 Atherosclerotic heart disease of native coronary artery without angina pectoris: Secondary | ICD-10-CM | POA: Insufficient documentation

## 2022-09-18 DIAGNOSIS — I472 Ventricular tachycardia, unspecified: Secondary | ICD-10-CM | POA: Diagnosis not present

## 2022-09-18 DIAGNOSIS — I255 Ischemic cardiomyopathy: Secondary | ICD-10-CM | POA: Diagnosis not present

## 2022-09-18 DIAGNOSIS — I442 Atrioventricular block, complete: Secondary | ICD-10-CM | POA: Diagnosis not present

## 2022-09-18 DIAGNOSIS — N183 Chronic kidney disease, stage 3 unspecified: Secondary | ICD-10-CM | POA: Insufficient documentation

## 2022-09-18 DIAGNOSIS — I350 Nonrheumatic aortic (valve) stenosis: Secondary | ICD-10-CM | POA: Diagnosis not present

## 2022-09-18 DIAGNOSIS — Z952 Presence of prosthetic heart valve: Secondary | ICD-10-CM | POA: Diagnosis not present

## 2022-09-18 HISTORY — PX: BIV ICD GENERATOR CHANGEOUT: EP1194

## 2022-09-18 SURGERY — BIV ICD GENERATOR CHANGEOUT

## 2022-09-18 MED ORDER — SODIUM CHLORIDE 0.9 % IV SOLN: INTRAVENOUS | Status: DC

## 2022-09-18 MED ORDER — LIDOCAINE HCL (PF) 1 % IJ SOLN
INTRAMUSCULAR | Status: DC | PRN
  Administered 2022-09-18: 55 mL

## 2022-09-18 MED ORDER — SODIUM CHLORIDE 0.9 % IV SOLN
INTRAVENOUS | Status: AC
  Administered 2022-09-18: 80 mg
  Filled 2022-09-18: qty 2

## 2022-09-18 MED ORDER — LIDOCAINE HCL (PF) 1 % IJ SOLN
INTRAMUSCULAR | Status: AC
  Filled 2022-09-18: qty 60

## 2022-09-18 MED ORDER — POVIDONE-IODINE 10 % EX SWAB
2.0000 | Freq: Once | CUTANEOUS | Status: AC
  Administered 2022-09-18: 2 via TOPICAL

## 2022-09-18 MED ORDER — CEFAZOLIN SODIUM-DEXTROSE 2-4 GM/100ML-% IV SOLN
INTRAVENOUS | Status: AC
  Administered 2022-09-18: 2 g via INTRAVENOUS
  Filled 2022-09-18: qty 100

## 2022-09-18 MED ORDER — HEPARIN (PORCINE) IN NACL 1000-0.9 UT/500ML-% IV SOLN: INTRAVENOUS | Status: DC | PRN

## 2022-09-18 MED ORDER — ONDANSETRON HCL 4 MG/2ML IJ SOLN: 4.0000 mg | Freq: Four times a day (QID) | INTRAMUSCULAR | Status: DC | PRN

## 2022-09-18 MED ORDER — MIDAZOLAM HCL 5 MG/5ML IJ SOLN
INTRAMUSCULAR | Status: DC | PRN
  Administered 2022-09-18: 1 mg via INTRAVENOUS

## 2022-09-18 MED ORDER — FENTANYL CITRATE (PF) 100 MCG/2ML IJ SOLN
INTRAMUSCULAR | Status: DC | PRN
  Administered 2022-09-18: 25 ug via INTRAVENOUS

## 2022-09-18 MED ORDER — CEFAZOLIN SODIUM-DEXTROSE 2-4 GM/100ML-% IV SOLN: 2.0000 g | INTRAVENOUS | Status: AC

## 2022-09-18 MED ORDER — LIDOCAINE HCL (PF) 1 % IJ SOLN
INTRAMUSCULAR | Status: AC
  Filled 2022-09-18: qty 30

## 2022-09-18 MED ORDER — CHLORHEXIDINE GLUCONATE 4 % EX SOLN: 4.0000 | Freq: Once | CUTANEOUS | Status: DC

## 2022-09-18 MED ORDER — SODIUM CHLORIDE 0.9 % IV SOLN: 80.0000 mg | INTRAVENOUS | Status: AC

## 2022-09-18 MED ORDER — ACETAMINOPHEN 325 MG PO TABS: 325.0000 mg | ORAL_TABLET | ORAL | Status: DC | PRN

## 2022-09-18 SURGICAL SUPPLY — 6 items
CABLE SURGICAL S-101-97-12 (CABLE) ×1 IMPLANT
ICD COBALT XT QUAD CRT DTPA2QQ (ICD Generator) IMPLANT
PAD DEFIB RADIO PHYSIO CONN (PAD) ×1 IMPLANT
POUCH AIGIS-R ANTIBACT ICD (Mesh General) ×1 IMPLANT
POUCH AIGIS-R ANTIBACT ICD LRG (Mesh General) IMPLANT
TRAY PACEMAKER INSERTION (PACKS) ×1 IMPLANT

## 2022-09-18 NOTE — Discharge Instructions (Signed)

## 2022-09-18 NOTE — H&P (Signed)
Electrophysiology Office Note   Date:  09/18/2022   ID:  KYLIE LOTTI, DOB 08-Aug-1932, MRN [id number]  PCP:  Joycelyn Rua, MD  Cardiologist:  Armanda Magic Primary Electrophysiologist: Mackynzie Woolford Jorja Loa, MD    No chief complaint on file.    History of Present Illness: [name] is a 89 y.o. male who presents today for electrophysiology evaluation.     He has a history significant for hypertension, aortic stenosis post AVR, chronic systolic heart failure due to ischemic cardiomyopathy, persistent atrial fibrillation/flutter, coronary artery disease post CABG.  He has past [employer] CRT-D with an upgrade 04/22/2015.  He is on sotalol and mexiletine for ventricular tachycardia.  Today, denies symptoms of palpitations, chest pain, shortness of breath, orthopnea, PND, lower extremity edema, claudication, dizziness, presyncope, syncope, bleeding, or neurologic sequela. The patient is tolerating medications without difficulties.  He presents to clinic today feeling somewhat weak and fatigued.  He is in atrial fibrillation.  He has been in atrial fibrillation for the last 2 months.  He states that he is able to mostly do all of his daily activities, but his atrial fibrillation is limiting.   Past Medical History:  Diagnosis Date   Arthritis    "minor everywhere" (04/22/2015)   Asthma    "a touch"   Barrett esophagus    CAD (coronary artery disease)    a. s/p 3 vvessel CABG with LIMA to LAD, SVG to diagonal 1, SVG to RCA 12/09.   Cardiac resynchronization therapy defibrillator (CRT-D) in place    Chronic anticoagulation 01/23/2013   Chronic combined systolic and diastolic CHF (congestive heart failure) (HCC)    dry weight 197-200lbs.   CKD (chronic kidney disease) stage 3, GFR 30-59 ml/min (HCC) 08/23/2014   Complication of anesthesia    DCM (dilated cardiomyopathy) (HCC)    initially concerned for TTP amyloidosis but Tc-PYP scan 06/2017 was not consistent with amyloid and  SPEP/UPEP with no M spike. EF 20-25% by echo 2019   Diverticulitis 08/2019   Dyslipidemia 01/23/2013   Essential hypertension 01/23/2013   GERD (gastroesophageal reflux disease)    GI bleed 05/08/2017   H/O: rheumatic fever    Hepatitis 1957   "don't know what kind:   History of hiatal hernia    History of PFTs    a. Amiodarone started 5/16 >> PFTs w/ DLCO 6/16:  FEV1 72% predicted, FEV1/FVC 66%, DLCO 66% >> minimal reversible obstructive airways disease with mild diffusion defect (suggestive of emphysema but absence of hyperinflation inconsistent with dx)   Hyperkalemia 08/23/2014   Hyperthyroidism 05/26/2017   Leg weakness 01/28/2017   Multiple thyroid nodules 08/13/2017   Orthostatic hypotension    Persistent atrial fibrillation (HCC) 01/23/2013   Pleural effusion on right 05/26/2017   Pneumonia 08/2014   "dr thought I may have had a touch"   Pre-diabetes    Protein-calorie malnutrition, severe 05/27/2017   S/P AVR (aortic valve replacement) 2009   a. severe AS s/p AVR with pericardial tissue valve 2009.   Shingles 08/23/2014   VT (ventricular tachycardia) (HCC) 05/06/2017   Past Surgical History:  Procedure Laterality Date   AORTIC VALVE REPLACEMENT  with 23-mm Magna Ease pericardial valve, model number   with 23-mm Magna Ease pericardial valve, model [id number], serial number [id number]    APPENDECTOMY  1940s   BI-VENTRICULAR IMPLANTABLE CARDIOVERTER DEFIBRILLATOR  (CRT-D)  04/22/2015   CARDIAC CATHETERIZATION N/A 11/04/2014   Procedure: Left Heart Cath and Cors/Grafts Angiography;  Surgeon: Marykay Lex, MD;  Location: [facility];  Service: Cardiovascular;  Laterality: N/A;   CARDIAC VALVE REPLACEMENT     CARDIOVERSION N/A 01/27/2013   Procedure: CARDIOVERSION;  Surgeon: Quintella Reichert, MD;  Location: [facility] ENDOS[facility];  Service: Cardiovascular;  Laterality: N/A;   CARDIOVERSION N/A 08/25/2014   Procedure: CARDIOVERSION;  Surgeon: Thurmon Fair, MD;  Location: [facility]OS[facility];   Service: Cardiovascular;  Laterality: N/A;   CARDIOVERSION N/A 05/28/2018   Procedure: CARDIOVERSION;  Surgeon: Chrystie Nose, MD;  Location: [facility] ENDOSCOPY;  Service: Cardiovascular;  Laterality: N/A;   CARDIOVERSION N/A 10/28/2019   Procedure: CARDIOVERSION;  Surgeon: Quintella Reichert, MD;  Location: [facility] ENDOSCOPY;  Service: Cardiovascular;  Laterality: N/A;   CARDIOVERSION N/A 09/01/2020   Procedure: CARDIOVERSION;  Surgeon: Lewayne Bunting, MD;  Location: [facility] ENDOSCOPY;  Service: Cardiovascular;  Laterality: N/A;   CARDIOVERSION N/A 04/04/2022   Procedure: CARDIOVERSION;  Surgeon: Chrystie Nose, MD;  Location: [facility] ENDOSCOPY;  Service: Cardiovascular;  Laterality: N/A;   CATARACT EXTRACTION W/ INTRAOCULAR LENS  IMPLANT, BILATERAL Bilateral    CHEST TUBE INSERTION Right 07/26/2017   Procedure: INSERTION PLEURAL DRAINAGE CATHETER - RIGHT;  Surgeon: Kerin Perna, MD;  Location: [facility] OR;  Service: Thoracic;  Laterality: Right;   CORONARY ARTERY BYPASS GRAFT  03/10/2008   x 3 Dr. Dorris Fetch   EP IMPLANTABLE DEVICE N/A 04/22/2015   Procedure: BiV ICD Insertion CRT-D;  Surgeon: Jenkins Risdon Jorja Loa, MD;  Location: [facility];  Service: Cardiovascular;  Laterality: N/A;   ESOPHAGOGASTRODUODENOSCOPY (EGD) WITH ESOPHAGEAL DILATION  X1   ESOPHAGOGASTRODUODENOSCOPY (EGD) WITH PROPOFOL N/A 05/09/2017   Procedure: ESOPHAGOGASTRODUODENOSCOPY (EGD) WITH PROPOFOL;  Surgeon: Jeani Hawking, MD;  Location: [facility] ENDOSCOPY;  Service: Endoscopy;  Laterality: N/A;   IR THORACENTESIS ASP PLEURAL SPACE W/IMG GUIDE  05/27/2017   IR THORACENTESIS ASP PLEURAL SPACE W/IMG GUIDE  06/17/2017   IR THORACENTESIS ASP PLEURAL SPACE W/IMG GUIDE  06/18/2017   IR THORACENTESIS ASP PLEURAL SPACE W/IMG GUIDE  07/11/2017   REMOVAL OF PLEURAL DRAINAGE CATHETER Right 09/13/2017   Procedure: REMOVAL OF PLEURAL DRAINAGE CATHETER;  Surgeon: Kerin Perna, MD;  Location: [facility] OR;  Service: Thoracic;  Laterality: Right;   RIGHT HEART CATH  AND CORONARY/GRAFT ANGIOGRAPHY N/A 05/31/2017   Procedure: RIGHT HEART CATH AND CORONARY/GRAFT ANGIOGRAPHY;  Surgeon: Dolores Patty, MD;  Location: [facility];  Service: Cardiovascular;  Laterality: N/A;   TONSILLECTOMY       Current Facility-Administered Medications  Medication Dose Route Frequency Provider Last Rate Last Admin   0.9 %  sodium chloride infusion   Intravenous Continuous Pretty Weltman, Andree Coss, MD 50 mL/hr at 09/18/22 1154 New Bag at 09/18/22 1154   ceFAZolin (ANCEF) IVPB 2g/100 mL premix  2 g Intravenous On Call Addylynn Balin Daphine Deutscher, MD       chlorhexidine (HIBICLENS) 4 % liquid 4 Application  4 Application Topical Once Keleigh Kazee Daphine Deutscher, MD       gentamicin (GARAMYCIN) 80 mg in sodium chloride 0.9 % 500 mL irrigation  80 mg Irrigation On Call Quasean Frye, Andree Coss, MD        Allergies:   Ranolazine, Procaine, and Amiodarone   Social History:  The patient  reports that he has never smoked. He has never used smokeless tobacco. He reports that he does not currently use alcohol. He reports that he does not use drugs.   Family History:  The patient's family history includes Alzheimer's disease in his mother and another family member; COPD in  his daughter and sister; Depression in his sister; Esophageal cancer in his sister; Heart disease in his sister; Hyperlipidemia in his sister; Hypertension in his sister.   ROS:  Please see the history of present illness.   Otherwise, review of systems is positive for none.   All other systems are reviewed and negative.   PHYSICAL EXAM: VS:  There were no vitals taken for this visit. , BMI There is no height or weight on file to calculate BMI. GEN: Well nourished, well developed, in no acute distress  HEENT: normal  Neck: no JVD, carotid bruits, or masses Cardiac: irregular; no murmurs, rubs, or gallops,no edema  Respiratory:  clear to auscultation bilaterally, normal work of breathing GI: soft, nontender, nondistended, +  BS MS: no deformity or atrophy  Skin: warm and dry, device site well healed Neuro:  Strength and sensation are intact Psych: euthymic mood, full affect  EKG:  EKG is ordered today. Personal review of the ekg ordered shows atrial fibrillation, ventricular paced, intermittent fusion beats  Personal review of the device interrogation today. Results in [facility]    Recent Labs: 07/05/2022: Magnesium 2.3 09/07/2022: BUN 33; Creatinine, Ser 2.19; Hemoglobin 13.7; Platelets 263; Potassium 4.3; Sodium 139    Lipid Panel     Component Value Date/Time   CHOL 106 06/27/2021 1409   CHOL 93 (L) 06/16/2014 0914   TRIG 193 (H) 06/27/2021 1409   TRIG 124 06/16/2014 0914   HDL 38 (L) 06/27/2021 1409   HDL 34 (L) 06/16/2014 0914   CHOLHDL 2.8 06/27/2021 1409   CHOLHDL 2.7 01/19/2015 1005   VLDL 32 (H) 01/19/2015 1005   LDLCALC 37 06/27/2021 1409   LDLCALC 34 06/16/2014 0914     Wt Readings from Last 3 Encounters:  07/19/22 89.2 kg  07/13/22 85.7 kg  06/25/22 90 kg      Other studies Reviewed: Additional studies/ records that were reviewed today include: TTE 05/29/17 Review of the above records today demonstrates:  - Left ventricle: The cavity size was normal. Wall thickness was   increased in a pattern of mild LVH. Systolic function was   severely reduced. The estimated ejection fraction was in the   range of 20% to 25%. There is akinesis of the inferolateral,   inferior, and inferoseptal myocardium. Features are consistent   with a pseudonormal left ventricular filling pattern, with   concomitant abnormal relaxation and increased filling pressure   (grade 2 diastolic dysfunction). - Aortic valve: A pericardial tissue valve bioprosthesis was   present and functioning normally. Peak velocity (S): 226 cm/s.   Mean gradient (S): 10 mm Hg. Valve area (VTI): 1.52 cm^2. Valve   area (Vmax): 1.5 cm^2. Valve area (Vmean): 1.59 cm^2. - Mitral valve: Calcified annulus. Mildly thickened leaflets  .   There was mild regurgitation. - Left atrium: The atrium was severely dilated. - Right ventricle: Pacer wire or catheter noted in right ventricle. - Pulmonary arteries: Systolic pressure was mildly increased. PA   peak pressure: 35 mm Hg (S).  RHC/LHC 05/31/17 Prox LAD to Mid LAD lesion is 50% stenosed. Mid LAD to Dist LAD lesion is 25% stenosed. 1st Mrg lesion is 50% stenosed. Prox RCA to Dist RCA lesion is 100% stenosed. And is large. Prox Graft to Mid Graft lesion is 30% stenosed. And is large. The flow in the graft is reversed. There is competitive flow. And is large. There is competitive flow. Ost 1st Diag to 1st Diag lesion is 90% stenosed.   Findings:  Ao = 145/67 (99)  RA = 6 RV = 70/9 PA = 69/30 (46) PCW = 26 v = 37 Fick cardiac output/index =4.7/2.3 PVR = 4.3 FA sat = 98% PA sat = 63%, 60%   Assessment:   1. 3v CAD with stable revascularization with patent LIMA-LAD, SVG-diagonal and SVG-RCA 2. 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 Normal cardiac output - no evidence of thyrotoxicosis heart disease or PH   ASSESSMENT AND PLAN:  1.  Chronic systolic heart failure:  ICD Criteria  Current LVEF:33%. Within 12 months prior to implant: No   Heart failure history: Yes, Class II  Cardiomyopathy history: Yes, Ischemic Cardiomyopathy - Prior MI.  Atrial Fibrillation/Atrial Flutter: Yes, Persistent (> 7 Days).  Ventricular tachycardia history: Yes, Hemodynamic instability present. VT Type: Sustained Ventricular Tachycardia - Monomorphic.  Cardiac arrest history: No.  History of syndromes with risk of sudden death: No.  Previous ICD: Yes, Reason for ICD:  Primary prevention.  Current ICD indication: Secondary  PPM indication: Yes. Pacing type: Ventricular. Greater than 40% RV pacing requirement anticipated. Indication: Complete Heart Block  Class I or II Bradycardia indication present:  Yes  Beta Blocker therapy for 3 or more months: Yes, prescribed.   Ace Inhibitor/ARB therapy for 3 or more months: No, medical reason.   I have seen HEDLEY STUDENT is a 89 y.o. malepre-procedural and has been referred by Mayford Knife for consideration of ICD implant for secondary prevention of sudden death.  The patient's chart has been reviewed and they meet criteria for ICD implant.  I have had a thorough discussion with the patient reviewing options.  The patient and their family (if available) have had opportunities to ask questions and have them answered. The patient and I have decided together through the [facility] Heart Care Share Decision Support Tool to gen change ICD at this time.  Risks, benefits, alternatives to ICD implantation were discussed in detail with the patient today. The patient  understands that the risks include but are not limited to bleeding, infection, pneumothorax, perforation, tamponade, vascular damage, renal failure, MI, stroke, death, inappropriate shocks, and lead dislodgement and  wishes to proceed.

## 2022-09-19 ENCOUNTER — Encounter (HOSPITAL_COMMUNITY): Payer: Self-pay | Admitting: Cardiology

## 2022-09-19 MED FILL — Lidocaine HCl Local Preservative Free (PF) Inj 1%: INTRAMUSCULAR | Qty: 30 | Status: AC

## 2022-09-22 NOTE — Progress Notes (Unsigned)
Cardiology Office Note Date:  09/22/2022  Patient ID:  [name], [name] November 19, 1932, MRN [id number] PCP:  Joycelyn Rua, MD  Cardiologist:  Dr. Mayford Knife AHF: Dr. Gala Romney Electrophysiologist: Dr. Elberta Fortis    Chief Complaint:  planned 6 mo f/u, ATP therapies in July  History of Present Illness: [name] is a 89 y.o. male with history of CAD/VHD s/p CABG/bioprosthetic AVR in 2009, chronic CHF (systolic), ICM, PAF, orthostatic hypotension, VT, ICD.    March 2019 required right thoras (x2), April another 2L drained Admitted 3/9 -> 06/20/17 with recurrent pleural effusion and markedly elevated BNP. Underwent Thoracentesis x 2. Had NSVT up to 40 beats, electrolytes replaced aggressively.  PYP scan 06/20/17 NOT suggestive of TTR amyloid)(and eventually placement of Pleurex catheter (Dr. Morton Peters) and removed June, CKD (III).  He has had recurrent VT over the years with appropriate therapies both ATP and HV therapy He has also had double tachycardia   09/25/19 Seen by A. Glory Buff, NP 2/2 to having been found on remotes to have prolonged episode of VT under detection that eventually accelerated enuough to get HV therapy. On arrival to his visit he was in a double tachycardia and with JA supervision pace terminated the VT though unable to terminate his AFlutter His sotalol was had been increased at a recent hospital stay though for unclear reasons discharged on 40mg  BID His sotalol was resumed to 80mg  BID, his VT zone HR was reduced.   He l A. Tillery, PA 10/21/2019, he remained in ,AF had mot missed any OAC doses and planned for DCCV > 10/28/19 successful  Saw Dr. Elberta Fortis May 2022, fatigued, in AFib, planned for DCCV   He last saw EP by A. Tillery, PA-C, 12/2020, he was doing well, no changes were made.  He saw Dr. Mayford Knife most recently 02/07/21, also reported as doing well , no changes were made  Device clinic note 02/14/21 reporting VT that occurred 01/28/21, successfully terminated  by ATP, pt was asymptomatic, advised not to drive Dr. 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 Discussed VT rates wobbled about detection rate.  I saw him 06/27/21 He is doing well Gets winded with heavier activities, though since his last visit has intentially increased his activities/exercise and cut out sweets, has lost 6lbs! No CP, no rest symptoms No dizzy spells, near syncope or syncope. No bleeding or signs of bleeding No device shocks  No VT Stable QTc Planned for labs review of his VT from Oct, rate was >142-143bpm with only one or 2 slower R-R intervals, I did not adjust his VT zone, am locked out from reducing this by other parameters  Device clinic note: 11/14/21 ATP therapies noted  I saw him 11/28/21 He is doing well. Continues to avoid sweets and getting slow steady weight loss, he feels better, breaths easire No CP, palpitations or cardiac awareness No dizzy spells, near syncope or syncope No rest SOB, no DOE with ADLs,  less SOB with heavier activities then he used to get. He feels like he is in a pretty good place He has been taking the lasix 1/2 tab Tues-Sunday and a full tab on Monday's He had a few days a couple weeks ago with low BP readings after his meds, but this has settled away. No bleeding or signs of bleeding Looked a little dry, lasix reduced, planned for labs  He saw Dr. Elberta Fortis 03/19/22, fatigued, had been in Afib a couple months, planned for  DCCV  04/05/23: DCCV successful  Recurrent AFib March, fatigued again with it, more winded, no other AAD options > DCCV, discussed may have to reside to permament AFib if recurrent   07/09/22, symptomatic weak, > ICD tx > brought in to the office event on 07/09/2022 @ 12:20 , 5 ATP & 1 HV successful shock. Reviewed with Dr. Graciela Husbands, appear atrial driven  Noted RRT and planned for gen change  Afib clinic 07/19/22 > back in AFib >> no further plans for rhythm control  *** symptoms *** site *** EKG QT, sotalol *** labs/lutes *** eliquis, labs, dose,bleeding   Device information MDT CRT-D, implanted1/13/17 > gen change 09/18/22 + hx of numerous appropriate therapies VT   AAD Hx: Amiodarone stopped Jan 2019, 2/2 SOB,  No clear finding of amio tox >> mexiletine started More VT and sotalol was added Dec 2020  AFib >> permanent established April 2024   Past Medical History:  Diagnosis Date   Arthritis    "minor everywhere" (04/22/2015)   Asthma    "a touch"   Barrett esophagus    CAD (coronary artery disease)    a. s/p 3 vvessel CABG with LIMA to LAD, SVG to diagonal 1, SVG to RCA 12/09.   Cardiac resynchronization therapy defibrillator (CRT-D) in place    Chronic anticoagulation 01/23/2013   Chronic combined systolic and diastolic CHF (congestive heart failure) (HCC)    dry weight 197-200lbs.   CKD (chronic kidney disease) stage 3, GFR 30-59 ml/min (HCC) 08/23/2014   Complication of anesthesia    DCM (dilated cardiomyopathy) (HCC)    initially concerned for TTP amyloidosis but Tc-PYP scan 06/2017 was not consistent with amyloid and SPEP/UPEP with no M spike. EF 20-25% by echo 2019   Diverticulitis 08/2019   Dyslipidemia 01/23/2013   Essential hypertension 01/23/2013   GERD (gastroesophageal reflux disease)    GI bleed 05/08/2017   H/O: rheumatic fever    Hepatitis 1957   "don't know what kind:   History of hiatal hernia    History of PFTs    a. Amiodarone started 5/16 >> PFTs  w/ DLCO 6/16:  FEV1 72% predicted, FEV1/FVC 66%, DLCO 66% >> minimal reversible obstructive airways disease with mild diffusion defect (suggestive of emphysema but absence of hyperinflation inconsistent with dx)   Hyperkalemia 08/23/2014   Hyperthyroidism 05/26/2017   Leg weakness 01/28/2017   Multiple thyroid nodules 08/13/2017   Orthostatic hypotension    Persistent atrial fibrillation (HCC) 01/23/2013   Pleural effusion on right 05/26/2017   Pneumonia 08/2014   "dr thought I may have had a touch"   Pre-diabetes    Protein-calorie malnutrition, severe 05/27/2017   S/P AVR (aortic valve replacement) 2009   a. severe AS s/p AVR with pericardial tissue valve 2009.   Shingles 08/23/2014   VT (ventricular tachycardia) (HCC) 05/06/2017    Past Surgical History:  Procedure Laterality Date   AORTIC VALVE REPLACEMENT  with 23-mm Magna Ease pericardial valve, model number   with  23-mm Magna Ease pericardial valve, model number3300TFX, serial number [id number]    APPENDECTOMY  1940s   BI-VENTRICULAR IMPLANTABLE CARDIOVERTER DEFIBRILLATOR  (CRT-D)  04/22/2015   BIV ICD GENERATOR CHANGEOUT N/A 09/18/2022   Procedure: BIV ICD GENERATOR CHANGEOUT;  Surgeon: Regan Lemming, MD;  Location: [facility] INVASIVE CV LAB;  Service: Cardiovascular;  Laterality: N/A;   CARDIAC CATHETERIZATION N/A 11/04/2014   Procedure: Left Heart Cath and Cors/Grafts Angiography;  Surgeon: Marykay Lex, MD;  Location: [facility] INVASIVE CV LAB;  Service: Cardiovascular;  Laterality: N/A;   CARDIAC VALVE REPLACEMENT     CARDIOVERSION N/A 01/27/2013   Procedure: CARDIOVERSION;  Surgeon: Quintella Reichert, MD;  Location: [facility] ENDOSCOPY;  Service: Cardiovascular;  Laterality: N/A;   CARDIOVERSION N/A 08/25/2014   Procedure: CARDIOVERSION;  Surgeon: Thurmon Fair, MD;  Location: [facility] ENDOSCOPY;  Service: Cardiovascular;  Laterality: N/A;   CARDIOVERSION N/A 05/28/2018   Procedure: CARDIOVERSION;  Surgeon: Chrystie Nose, MD;  Location: [facility] ENDOSCOPY;   Service: Cardiovascular;  Laterality: N/A;   CARDIOVERSION N/A 10/28/2019   Procedure: CARDIOVERSION;  Surgeon: Quintella Reichert, MD;  Location: [facility] ENDOSCOPY;  Service: Cardiovascular;  Laterality: N/A;   CARDIOVERSION N/A 09/01/2020   Procedure: CARDIOVERSION;  Surgeon: Lewayne Bunting, MD;  Location: [facility] ENDOSCOPY;  Service: Cardiovascular;  Laterality: N/A;   CARDIOVERSION N/A 04/04/2022   Procedure: CARDIOVERSION;  Surgeon: Chrystie Nose, MD;  Location: [facility] ENDOSCOPY;  Service: Cardiovascular;  Laterality: N/A;   CATARACT EXTRACTION W/ INTRAOCULAR LENS  IMPLANT, BILATERAL Bilateral    CHEST TUBE INSERTION Right 07/26/2017   Procedure: INSERTION PLEURAL DRAINAGE CATHETER - RIGHT;  Surgeon: Kerin Perna, MD;  Location: [facility] OR;  Service: Thoracic;  Laterality: Right;   CORONARY ARTERY BYPASS GRAFT  03/10/2008   x 3 Dr. Dorris Fetch   EP IMPLANTABLE DEVICE N/A 04/22/2015   Procedure: BiV ICD Insertion CRT-D;  Surgeon: Will Jorja Loa, MD;  Location: [facility] INVASIVE CV LAB;  Service: Cardiovascular;  Laterality: N/A;   ESOPHAGOGASTRODUODENOSCOPY (EGD) WITH ESOPHAGEAL DILATION  X1   ESOPHAGOGASTRODUODENOSCOPY (EGD) WITH PROPOFOL N/A 05/09/2017   Procedure: ESOPHAGOGASTRODUODENOSCOPY (EGD) WITH PROPOFOL;  Surgeon: Jeani Hawking, MD;  Location: [facility] ENDOSCOPY;  Service: Endoscopy;  Laterality: N/A;   IR THORACENTESIS ASP PLEURAL SPACE W/IMG GUIDE  05/27/2017   IR THORACENTESIS ASP PLEURAL SPACE W/IMG GUIDE  06/17/2017   IR THORACENTESIS ASP PLEURAL SPACE W/IMG GUIDE  06/18/2017   IR THORACENTESIS ASP PLEURAL SPACE W/IMG GUIDE  07/11/2017   REMOVAL OF PLEURAL DRAINAGE CATHETER Right 09/13/2017   Procedure: REMOVAL OF PLEURAL DRAINAGE CATHETER;  Surgeon: Kerin Perna, MD;  Location: [facility] OR;  Service: Thoracic;  Laterality: Right;   RIGHT HEART CATH AND CORONARY/GRAFT ANGIOGRAPHY N/A 05/31/2017   Procedure: RIGHT HEART CATH AND CORONARY/GRAFT ANGIOGRAPHY;  Surgeon: Dolores Patty, MD;  Location: [facility]  INVASIVE CV LAB;  Service: Cardiovascular;  Laterality: N/A;   TONSILLECTOMY      Current Outpatient Medications  Medication Sig Dispense Refill   acetaminophen (TYLENOL) 500 MG tablet Take 1,000 mg by mouth every 6 (six) hours as needed for moderate pain or headache.     albuterol (VENTOLIN HFA) 108 (90 Base) MCG/ACT inhaler Inhale 1-2 puffs into the lungs every 6 (six) hours as needed for wheezing or shortness of breath.     atorvastatin (LIPITOR) 40 MG tablet TAKE 1 TABLET EVERY DAY (Patient taking differently: Take 40 mg by mouth every evening.) 90 tablet 0   carvedilol (COREG) 25 MG tablet Take 1 tablet (25  mg total) by mouth 2 (two) times daily with a meal. ( DOSE INCREASE) Please keep November appointment for future refills. Thank you. 180 tablet 0   ELIQUIS 2.5 MG TABS tablet TAKE 1 TABLET TWICE DAILY 180 tablet 3   famotidine (PEPCID) 40 MG tablet Take 40 mg by mouth every evening.     furosemide (LASIX) 40 MG tablet TAKE 1 TABLET EVERY DAY AS NEEDED FOR EDEMA (Patient taking differently: Take 20 mg by mouth daily.) 90 tablet 3   hydrALAZINE (APRESOLINE) 25 MG tablet TAKE 1/2 TABLET BY MOUTH IN THE MORNING AND AT BEDTIME 90 tablet 3   isosorbide mononitrate (IMDUR) 30 MG 24 hr tablet Take 1 tablet (30 mg total) by mouth daily. Please keep November appointment for future refills. Thank you. (Patient taking differently: Take 30 mg by mouth every evening. Please keep November appointment for future refills. Thank you.) 90 tablet 0   mexiletine (MEXITIL) 150 MG capsule TAKE 2 CAPSULES BY MOUTH EVERY 12 HOURS. 360 capsule 2   Multiple Vitamin (MULTIVITAMIN WITH MINERALS) TABS tablet Take 1 tablet by mouth in the morning.     pantoprazole (PROTONIX) 40 MG tablet Take 40 mg by mouth daily before breakfast.     Polyethyl Glycol-Propyl Glycol (SYSTANE OP) Place 1 drop into both eyes 3 (three) times daily as needed (dry/irritated eyes.).     sodium bicarbonate 650 MG tablet Take 650 mg by mouth in  the morning.     sotalol (BETAPACE) 120 MG tablet TAKE 1 TABLET EVERY DAY 90 tablet 1   VASCEPA 1 g capsule TAKE 2 CAPSULES BY MOUTH 2 TIMES DAILY. 360 capsule 3   No current facility-administered medications for this visit.    Allergies:   Ranolazine, Procaine, and Amiodarone   Social History:  The patient  reports that he has never smoked. He has never used smokeless tobacco. He reports that he does not currently use alcohol. He reports that he does not use drugs.   Family History:  The patient's family history includes Alzheimer's disease in his mother and another family member; COPD in his daughter and sister; Depression in his sister; Esophageal cancer in his sister; Heart disease in his sister; Hyperlipidemia in his sister; Hypertension in his sister.  ROS:  Please see the history of present illness.   All other systems are reviewed and otherwise negative.   PHYSICAL EXAM:  VS:  There were no vitals taken for this visit. BMI: There is no height or weight on file to calculate BMI. Well nourished, well developed, in no acute distress  HEENT: normocephalic, atraumatic  Neck: no JVD, carotid bruits or masses Cardiac: ***  RRR; no significant murmurs, no rubs, or gallops Lungs: *** CTA b/l, no wheezing, rhonchi or rales  Abd: soft, nontender, MS: no deformity,  age appropriate atrophy Ext: *** no edema  Skin: warm and dry, no rash Neuro:  No gross deficits appreciated Psych: euthymic mood, full affect  *** ICD site is stable, no tethering or discomfort   EKG:  Done today and reviewed by myself: ***  ICD interrogation done today and reviewed by myself:   ***   09/07/2019: TTE IMPRESSIONS   1. Left ventricular ejection fraction, by estimation, is 30 to 35%. The  left ventricle has moderately decreased function. Left ventricular  endocardial border not optimally defined to evaluate regional wall motion.  Left ventricular diastolic parameters  are consistent with Grade II  diastolic dysfunction (pseudonormalization).  Elevated left ventricular end-diastolic pressure.  2. Right ventricular systolic function is moderately reduced. The right  ventricular size is normal. Tricuspid regurgitation signal is inadequate  for assessing PA pressure.   3. Left atrial size was mildly dilated.   4. The mitral valve is abnormal. Trivial mitral valve regurgitation. No  evidence of mitral stenosis.   5. The aortic valve has been repaired/replaced. Aortic valve  regurgitation is not visualized. There is a 23 mm Magna Ease pericardial  valve present in the aortic position. Procedure Date: 03/10/2008. Echo  findings are consistent with normal structure and   function of the aortic valve prosthesis. Aortic valve mean gradient  measures 6.0 mmHg.    05/31/17; R/LHC Assessment: 1. 3v CAD with stable revascularization with patent LIMA-LAD, SVG-diagonal and SVG-RCA 2. 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 Normal cardiac output - no evidence of thyrotoxicosis heart disease or PH  05/27/17: TTE Study Conclusions - Left ventricle: The cavity size was normal. Wall thickness was   increased in a pattern of mild LVH. Systolic function was   severely reduced. The estimated ejection fraction was in the   range of 20% to 25%. There is akinesis of the inferolateral,   inferior, and inferoseptal myocardium. Features are consistent   with a pseudonormal left ventricular filling pattern, with   concomitant abnormal relaxation and increased filling pressure   (grade 2 diastolic dysfunction). - Aortic valve: A pericardial tissue valve bioprosthesis was   present and functioning normally. Peak velocity (S): 226 cm/s.   Mean gradient (S): 10 mm Hg. Valve area (VTI): 1.52 cm^2. Valve   area (Vmax): 1.5 cm^2. Valve area (Vmean): 1.59 cm^2. - Mitral valve: Calcified annulus. Mildly thickened leaflets .   There was mild regurgitation. -  Left atrium: The atrium was severely dilated. - Right ventricle: Pacer wire or catheter noted in right ventricle. - Pulmonary arteries: Systolic pressure was mildly increased. PA   peak pressure: 35 mm Hg (S). Impressions: - Compared to the prior study, there has been no significant   interval change.  Recent Labs: 07/05/2022: Magnesium 2.3 09/07/2022: BUN 33; Creatinine, Ser 2.19; Hemoglobin 13.7; Platelets 263; Potassium 4.3; Sodium 139  No results found for requested labs within last 365 days.   Estimated Creatinine Clearance: 26.1 mL/min (A) (by C-G formula based on SCr of 2.19 mg/dL (H)).   Wt Readings from Last 3 Encounters:  09/18/22 188 lb (85.3 kg)  07/19/22 196 lb 9.6 oz (89.2 kg)  07/13/22 189 lb (85.7 kg)     Other studies reviewed: Additional studies/records reviewed today include: summarized above  ASSESSMENT AND PLAN:  1. VT 2. ICD      *** Intact device function      *** No programming changes made      *** On mexiletine and sotalol      *** QTc looks stable  *** Labs today   3. CAD     No anginal complaints, with excellent exertional capacity     on BB, statin, no ASA w/OAC     C/w Dr. Mayford Knife  4. ICM     On BB, hydralazine, Imdur, and diuretic     No on ACE/ARB 2/2 renal insuff     *** Exam does not suggest volume OL     *** Optivol      5. VHD Bioprosthetic AVR     Functioning well by TTE in May 2021     C/w Dr. Mayford Knife  6. Hx of orthostatic hypotension     *** No symptoms of late, has had some lower BPs           7. Permanent AFib, flutter     CHA2DS2Vasc is 5, on Eliquis, *** appropriately dosed for age/renal insuff     *** % burden              Disposition: ***    Current medicines are reviewed at length with the patient today.  The patient did not have any concerns regarding medicines.  Norma Fredrickson, PA-C 09/22/2022 12:31 PM     [facility] [address] [place] [address] [phone]  (office)  [phone] (fax)

## 2022-09-23 NOTE — Progress Notes (Addendum)
Cardiology Office Note:  .   Date:  09/24/2022  ID:  [name], DOB 1933/04/08, MRN [id number] PCP: Joycelyn Rua, MD  [facility] Providers Cardiologist:  Armanda Magic, MD Electrophysiologist:  Regan Lemming, MD     History of Present Illness: .   UMAIR GRADDICK is a 89 y.o. male with PMH of Hyperthyroidism, CKD4, R Pleural Effusion s/p PleurX s/p removal, HTN, HLD,CAD, Chronic Combined Systolic/Diastolic CHF, ICM, AS s/p bioprosthetic AVR, AF on anticoagulation. He has a [employer] CRT-D with an upgrade in 04/2015, on sotalol + mexiletine for VT.  In regards to his permanent AF, In Dec 2023, he had persistent AF/flutter and was set up for cardioversion which was successful. He was on coreg & sotalol. In 06/2022, pacemaker interrogation showed frequent episodes of AF with two treated with ATP. Optivol indicated excess fluid. He was weak/tired in the setting of medication confusion and taking sotolol BID. In 07/2022, he was seen in the AF Clinic and was V paced with underlying AFib. He was planned for DCCV on 4/5 but had an episode on 4/1 where he was weak, near syncopal and was shocked by his device for VT. He was in SR (APVP) post shock and DCCV was canceled. Clinic visit 07/19/22 he was in AFib & established to be in permanent AF.  On September 19, 2022, he had a BiV Generator change by Dr. Elberta Fortis. Since that time he reports he has been doing well. Has mild soreness at site & is anxious to get the steri-strips/dressing off. He reports his weight is within his normal range but the upper limit of his normal (188-190lbs). He denies issues with bleeding.  States he can walk 250ft before SOB & need to rest.  This is unchanged. No significant swelling.   ROS: He denies chest pain, pain with inspiration, dizziness, lightheadedness. Otherwise negative unless mentioned in HPI.  EP Information / Studies Reviewed: .    Studies:   LHC 2019 > 3v CAD with stable revascularization, moderate pH  with mix of pulmonary venous/arterial HTN (WHO 2, 3), normal CO EKG 07/19/22 > AF, VP EKG 09/24/22 > personally reviewed, VP, QT 440, QTc 521 (minus QRS ~40)  Device information: MDT CRT-D, implanted1/13/17 > gen change 09/18/22. Hx numerous appropriate therapies VT Device Interrogation 09/24/22 > presenting aflutter with BVP, 9.5 years battery life, no VT, 96% BVpaced, 100% Aflutter   AAD Hx: Amiodarone stopped Jan 2019, 2/2 SOB,  No clear finding of amio tox > mexiletine started. More VT and sotalol was added Dec 2020.    Risk Assessment/Calculations:    CHA2DS2-VASc Score = 5   This indicates a 7.2% annual risk of stroke. The patient's score is based upon: CHF History: 1 HTN History: 1 Diabetes History: 0 Stroke History: 0 Vascular Disease History: 1 Age Score: 2 Gender Score: 0            Physical Exam:   VS:  BP 126/80   Pulse 84   Ht 6' (1.829 m)   Wt 192 lb (87.1 kg)   SpO2 96%   BMI 26.04 kg/m    Wt Readings from Last 3 Encounters:  09/24/22 192 lb (87.1 kg)  09/18/22 188 lb (85.3 kg)  07/19/22 196 lb 9.6 oz (89.2 kg)    GEN: Well nourished, well developed in no acute distress NECK: No JVD; No carotid bruits CARDIAC: S1S2 RRR, no murmurs, rubs, gallops. Tegaderm removed without difficulty, pacemaker site with steri strips intact, scant dried  blood under strips, strips remain intact, no edema, erythema or tethering.  RESPIRATORY:  Clear to auscultation without rales, wheezing or rhonchi  ABDOMEN: Soft, non-tender, non-distended EXTREMITIES:  No edema; No deformity   ASSESSMENT AND PLAN: .    VT s/p ICD   LVEF 33% -PPM with appropriate device function  -post procedure site healing well  -no programming changes made   -continue sotalol, mexiletine  -obtain BMP, Mg+ with CKD, sotolol -EKG with stable QTc  -visit too soon to remove steri-strips / concern for wound dehiscence if removed too early. Hold removal until planned follow up on 6/20 with wound clinic.     Permanent Atrial Fibrillation  Intolerant to Amiodarone d/t side effects, low CrCl. CHA2DS2VASC 5. Established permanent in 07/2022.  -continue coreg, eliquis 2.5 mg BID, dose reviewed & appropriate (age/ Cr 2.19 08/2022) -unable to assess % burden on device review d/t new gen change   Secondary Hypercoagulable State  -anticoagulation as above   CAD  -continue BB, statin, ASA & OAC -follows with Dr. Mayford Knife   ICM On BB, hydralazine, imdur, diuretic. No ACE/ARB 2/2 renal insuffiency.  -euvolemic on exam    Valvular Heart disease s/p Bioprosthetic AVR TTE 2021 shows AVR functioning well  -follow with Dr. Mayford Knife   Hx Orthostatic Hypotension  BP stable / well controlled         Dispo: Follow up in 3 months with Dr. Elberta Fortis for post device follow up.   Signed, Canary Brim, MSN, APRN, NP-C, AGACNP-BC [facility] - Electrophysiology  09/24/2022, 3:12 PM

## 2022-09-24 ENCOUNTER — Ambulatory Visit: Payer: Medicare HMO | Attending: Physician Assistant | Admitting: Pulmonary Disease

## 2022-09-24 ENCOUNTER — Encounter: Payer: Self-pay | Admitting: Pulmonary Disease

## 2022-09-24 VITALS — BP 126/80 | HR 84 | Ht 72.0 in | Wt 192.0 lb

## 2022-09-24 DIAGNOSIS — I472 Ventricular tachycardia, unspecified: Secondary | ICD-10-CM

## 2022-09-24 DIAGNOSIS — I4819 Other persistent atrial fibrillation: Secondary | ICD-10-CM

## 2022-09-24 DIAGNOSIS — Z95810 Presence of automatic (implantable) cardiac defibrillator: Secondary | ICD-10-CM

## 2022-09-24 DIAGNOSIS — N1832 Chronic kidney disease, stage 3b: Secondary | ICD-10-CM | POA: Diagnosis not present

## 2022-09-24 DIAGNOSIS — I5042 Chronic combined systolic (congestive) and diastolic (congestive) heart failure: Secondary | ICD-10-CM

## 2022-09-24 DIAGNOSIS — Z79899 Other long term (current) drug therapy: Secondary | ICD-10-CM | POA: Diagnosis not present

## 2022-09-24 DIAGNOSIS — I5022 Chronic systolic (congestive) heart failure: Secondary | ICD-10-CM | POA: Diagnosis not present

## 2022-09-24 LAB — CUP PACEART INCLINIC DEVICE CHECK
Battery Remaining Longevity: 114 mo
Battery Voltage: 3.07 V
Brady Statistic RA Percent Paced: 0.2 %
Brady Statistic RV Percent Paced: 96.92 %
Date Time Interrogation Session: 20240617174407
HighPow Impedance: 59 Ohm
Implantable Lead Connection Status: 753985
Implantable Lead Connection Status: 753985
Implantable Lead Connection Status: 753985
Implantable Lead Implant Date: 20170113
Implantable Lead Implant Date: 20170113
Implantable Lead Implant Date: 20170113
Implantable Lead Location: 753858
Implantable Lead Location: 753859
Implantable Lead Location: 753860
Implantable Lead Model: 4298
Implantable Lead Model: 5076
Implantable Pulse Generator Implant Date: 20240611
Lead Channel Impedance Value: 1064 Ohm
Lead Channel Impedance Value: 323 Ohm
Lead Channel Impedance Value: 399 Ohm
Lead Channel Impedance Value: 418 Ohm
Lead Channel Impedance Value: 418 Ohm
Lead Channel Impedance Value: 418 Ohm
Lead Channel Impedance Value: 513 Ohm
Lead Channel Impedance Value: 513 Ohm
Lead Channel Impedance Value: 608 Ohm
Lead Channel Impedance Value: 855 Ohm
Lead Channel Impedance Value: 855 Ohm
Lead Channel Impedance Value: 931 Ohm
Lead Channel Impedance Value: 950 Ohm
Lead Channel Pacing Threshold Amplitude: 0.5 V
Lead Channel Pacing Threshold Amplitude: 0.5 V
Lead Channel Pacing Threshold Amplitude: 0.625 V
Lead Channel Pacing Threshold Amplitude: 0.75 V
Lead Channel Pacing Threshold Pulse Width: 0.4 ms
Lead Channel Pacing Threshold Pulse Width: 0.4 ms
Lead Channel Pacing Threshold Pulse Width: 0.4 ms
Lead Channel Pacing Threshold Pulse Width: 0.4 ms
Lead Channel Sensing Intrinsic Amplitude: 1.5 mV
Lead Channel Sensing Intrinsic Amplitude: 11.5 mV
Lead Channel Setting Pacing Amplitude: 1.25 V
Lead Channel Setting Pacing Amplitude: 1.5 V
Lead Channel Setting Pacing Amplitude: 2 V
Lead Channel Setting Pacing Pulse Width: 0.4 ms
Lead Channel Setting Pacing Pulse Width: 0.4 ms
Lead Channel Setting Sensing Sensitivity: 0.3 mV
Zone Setting Status: 755011
Zone Setting Status: 755011

## 2022-09-24 NOTE — Patient Instructions (Addendum)
Medication Instructions:   Your physician recommends that you continue on your current medications as directed. Please refer to the Current Medication list given to you today.   *If you need a refill on your cardiac medications before your next appointment, please call your pharmacy*   Lab Work: NONE ORDERED  TODAY   If you have labs (blood work) drawn today and your tests are completely normal, you will receive your results only by: MyChart Message (if you have MyChart) OR A paper copy in the mail If you have any lab test that is abnormal or we need to change your treatment, we will call you to review the results.   Testing/Procedures:  NONE ORDERED  TODAY    Follow-Up: At [facility], you and your health needs are our priority.  As part of our continuing mission to provide you with exceptional heart care, we have created designated Provider Care Teams.  These Care Teams include your primary Cardiologist (physician) and Advanced Practice Providers (APPs -  Physician Assistants and Nurse Practitioners) who all work together to provide you with the care you need, when you need it.  We recommend signing up for the patient portal called "MyChart".  Sign up information is provided on this After Visit Summary.  MyChart is used to connect with patients for Virtual Visits (Telemedicine).  Patients are able to view lab/test results, encounter notes, upcoming appointments, etc.  Non-urgent messages can be sent to your provider as well.   To learn more about what you can do with MyChart, go to [email or web address].    Your next appointment:  AS SCHEDULED    Provider:   You may see Will Jorja Loa, MD or one of the following Advanced Practice Providers on your designated Care Team:   Francis Dowse, [place] Casimiro Needle "Mardelle Matte" [place], [place] Sherie Don, NP    Other Instructions

## 2022-09-25 LAB — BASIC METABOLIC PANEL
BUN/Creatinine Ratio: 13 (ref 10–24)
BUN: 29 mg/dL — ABNORMAL HIGH (ref 8–27)
CO2: 22 mmol/L (ref 20–29)
Calcium: 9 mg/dL (ref 8.6–10.2)
Chloride: 101 mmol/L (ref 96–106)
Creatinine, Ser: 2.18 mg/dL — ABNORMAL HIGH (ref 0.76–1.27)
Glucose: 121 mg/dL — ABNORMAL HIGH (ref 70–99)
Potassium: 4.9 mmol/L (ref 3.5–5.2)
Sodium: 138 mmol/L (ref 134–144)
eGFR: 29 mL/min/{1.73_m2} — ABNORMAL LOW (ref >59)

## 2022-09-25 LAB — MAGNESIUM: Magnesium: 2.3 mg/dL (ref 1.6–2.3)

## 2022-09-27 ENCOUNTER — Ambulatory Visit: Payer: Medicare HMO | Attending: Internal Medicine

## 2022-09-27 DIAGNOSIS — I472 Ventricular tachycardia, unspecified: Secondary | ICD-10-CM

## 2022-09-27 DIAGNOSIS — I255 Ischemic cardiomyopathy: Secondary | ICD-10-CM

## 2022-09-27 LAB — CUP PACEART INCLINIC DEVICE CHECK
Date Time Interrogation Session: 20240620152417
Implantable Lead Connection Status: 753985
Implantable Lead Connection Status: 753985
Implantable Lead Connection Status: 753985
Implantable Lead Implant Date: 20170113
Implantable Lead Implant Date: 20170113
Implantable Lead Implant Date: 20170113
Implantable Lead Location: 753858
Implantable Lead Location: 753859
Implantable Lead Location: 753860
Implantable Lead Model: 4298
Implantable Lead Model: 5076
Implantable Pulse Generator Implant Date: 20240611

## 2022-09-27 NOTE — Patient Instructions (Signed)
   After Your ICD (Implantable Cardiac Defibrillator)    Monitor your defibrillator site for redness, swelling, and drainage. Call the device clinic at [phone] if you experience these symptoms or fever/chills.  Your incision was closed with Steri-strips or staples:  You may shower 7 days after your procedure and wash your incision with soap and water. Avoid lotions, ointments, or perfumes over your incision until it is well-healed.  You may use a hot tub or a pool after your wound check appointment if the incision is completely closed.  There are no restrictions in arm movement after your wound check appointment.   Your ICD is designed to protect you from life threatening heart rhythms. Because of this, you may receive a shock.   1 shock with no symptoms:  Call the office during business hours. 1 shock with symptoms (chest pain, chest pressure, dizziness, lightheadedness, shortness of breath, overall feeling unwell):  Call 911. If you experience 2 or more shocks in 24 hours:  Call 911. If you receive a shock, you should not drive.  [place] DMV - no driving for 6 months if you receive appropriate therapy from your ICD.   ICD Alerts:  Some alerts are vibratory and others beep. These are NOT emergencies. Please call our office to let us know. If this occurs at night or on weekends, it can wait until the next business day. Send a remote transmission.  If your device is capable of reading fluid status (for heart failure), you will be offered monthly monitoring to review this with you.   Remote monitoring is used to monitor your ICD from home. This monitoring is scheduled every 91 days by our office. It allows us to keep an eye on the functioning of your device to ensure it is working properly. You will routinely see your Electrophysiologist annually (more often if necessary).  

## 2022-09-27 NOTE — Progress Notes (Signed)
Wound check appointment. Steri-strips removed. Wound without redness or edema. Incision edges approximated, wound well healed. Patient educated about wound care, arm mobility, and shock plan. ROV in 3 months with implanting physician.

## 2022-10-02 ENCOUNTER — Telehealth: Payer: Self-pay

## 2022-10-02 MED ORDER — AMOXICILLIN 500 MG PO CAPS: ORAL_CAPSULE | ORAL | 3 refills | Status: DC

## 2022-10-02 NOTE — Telephone Encounter (Signed)
Pharmacy please advise on holding Eliquis prior to filling and 1 tooth and provisional pontic bonding of 2 teeth scheduled for 10/08/2022. Thank you.

## 2022-10-02 NOTE — Telephone Encounter (Signed)
   Pre-operative Risk Assessment    Patient Name: [name]  DOB: July 02, 1932 MRN: [id number]     Request for Surgical Clearance    Procedure:   FILLING IN 1 TOOTH AND PROVISIONAL PONTIC (BONDING 2 TEETH TOGETHER)   Date of Surgery:  Clearance 10/08/22                                 Surgeon:  DR. Arrie Senate Surgeon's Group or Practice Name:  [facility] Phone number:  [phone] Fax number:  [phone]   Type of Clearance Requested:   PHARMACY: DOES PATIENT NEED ANTIBIOTIC FOR DENTAL PROCEDURE. PATIENT IS ALSO ON ELIQUIS AND NEEDS INSTRUCTIONS TO HOLD.    Type of Anesthesia:  Local    Additional requests/questions:    SignedMichaelle Copas   10/02/2022, 4:48 PM

## 2022-10-02 NOTE — Telephone Encounter (Signed)
Pt does not need to hold anticoag for dental filling or bonding of teeth.   He does require SBE ppx given history of bioprosthetic AVR. Please let pt know I have sent in rx for amoxicillin to his pharmacy.

## 2022-10-03 NOTE — Telephone Encounter (Signed)
   Patient Name: [name]  DOB: 10/27/1932 MRN: [id number]  Primary Cardiologist: Armanda Magic, MD  Clinical pharmacists have reviewed the patient's past medical history, labs, and current medications as part of preoperative protocol coverage. The following recommendations have been made:  Patient will need SBE prophylaxis due to history of bioprosthetic AVR and amoxicillin has been sent to patient's pharmacy.  -Patient will not require holding anticoagulation for requested procedure.         I will route this recommendation to the requesting party via Epic fax function and remove from pre-op pool.  Please call with questions.  Napoleon Form, Leodis Rains, NP 10/03/2022, 7:44 AM

## 2022-10-08 ENCOUNTER — Other Ambulatory Visit: Payer: Self-pay | Admitting: Physician Assistant

## 2022-10-08 ENCOUNTER — Other Ambulatory Visit (HOSPITAL_BASED_OUTPATIENT_CLINIC_OR_DEPARTMENT_OTHER): Payer: Self-pay | Admitting: Cardiology

## 2022-10-08 ENCOUNTER — Other Ambulatory Visit: Payer: Self-pay | Admitting: Cardiology

## 2022-10-21 ENCOUNTER — Encounter: Payer: Self-pay | Admitting: Cardiology

## 2022-10-21 DIAGNOSIS — R5383 Other fatigue: Secondary | ICD-10-CM | POA: Diagnosis not present

## 2022-10-21 DIAGNOSIS — J029 Acute pharyngitis, unspecified: Secondary | ICD-10-CM | POA: Diagnosis not present

## 2022-10-21 DIAGNOSIS — Z03818 Encounter for observation for suspected exposure to other biological agents ruled out: Secondary | ICD-10-CM | POA: Diagnosis not present

## 2022-10-21 DIAGNOSIS — R051 Acute cough: Secondary | ICD-10-CM | POA: Diagnosis not present

## 2022-10-24 DIAGNOSIS — I5040 Unspecified combined systolic (congestive) and diastolic (congestive) heart failure: Secondary | ICD-10-CM | POA: Diagnosis not present

## 2022-10-24 DIAGNOSIS — Z79899 Other long term (current) drug therapy: Secondary | ICD-10-CM | POA: Diagnosis not present

## 2022-10-24 DIAGNOSIS — I25119 Atherosclerotic heart disease of native coronary artery with unspecified angina pectoris: Secondary | ICD-10-CM | POA: Diagnosis not present

## 2022-10-24 DIAGNOSIS — D6869 Other thrombophilia: Secondary | ICD-10-CM | POA: Diagnosis not present

## 2022-10-24 DIAGNOSIS — E1122 Type 2 diabetes mellitus with diabetic chronic kidney disease: Secondary | ICD-10-CM | POA: Diagnosis not present

## 2022-10-24 DIAGNOSIS — E7800 Pure hypercholesterolemia, unspecified: Secondary | ICD-10-CM | POA: Diagnosis not present

## 2022-10-24 DIAGNOSIS — N1832 Chronic kidney disease, stage 3b: Secondary | ICD-10-CM | POA: Diagnosis not present

## 2022-10-24 DIAGNOSIS — I255 Ischemic cardiomyopathy: Secondary | ICD-10-CM | POA: Diagnosis not present

## 2022-10-24 DIAGNOSIS — I130 Hypertensive heart and chronic kidney disease with heart failure and stage 1 through stage 4 chronic kidney disease, or unspecified chronic kidney disease: Secondary | ICD-10-CM | POA: Diagnosis not present

## 2022-10-24 DIAGNOSIS — Z0000 Encounter for general adult medical examination without abnormal findings: Secondary | ICD-10-CM | POA: Diagnosis not present

## 2022-10-24 DIAGNOSIS — I480 Paroxysmal atrial fibrillation: Secondary | ICD-10-CM | POA: Diagnosis not present

## 2022-12-19 ENCOUNTER — Ambulatory Visit (INDEPENDENT_AMBULATORY_CARE_PROVIDER_SITE_OTHER): Payer: Medicare HMO

## 2022-12-19 DIAGNOSIS — I255 Ischemic cardiomyopathy: Secondary | ICD-10-CM

## 2022-12-19 DIAGNOSIS — I5022 Chronic systolic (congestive) heart failure: Secondary | ICD-10-CM

## 2022-12-19 LAB — CUP PACEART REMOTE DEVICE CHECK
Battery Remaining Longevity: 110 mo
Battery Voltage: 3.02 V
Brady Statistic RV Percent Paced: 92.62 %
Date Time Interrogation Session: 20240910212215
HighPow Impedance: 63 Ohm
Implantable Lead Connection Status: 753985
Implantable Lead Connection Status: 753985
Implantable Lead Connection Status: 753985
Implantable Lead Implant Date: 20170113
Implantable Lead Implant Date: 20170113
Implantable Lead Implant Date: 20170113
Implantable Lead Location: 753858
Implantable Lead Location: 753859
Implantable Lead Location: 753860
Implantable Lead Model: 4298
Implantable Lead Model: 5076
Implantable Pulse Generator Implant Date: 20240611
Lead Channel Impedance Value: 1064 Ohm
Lead Channel Impedance Value: 342 Ohm
Lead Channel Impedance Value: 418 Ohm
Lead Channel Impedance Value: 418 Ohm
Lead Channel Impedance Value: 418 Ohm
Lead Channel Impedance Value: 437 Ohm
Lead Channel Impedance Value: 513 Ohm
Lead Channel Impedance Value: 532 Ohm
Lead Channel Impedance Value: 589 Ohm
Lead Channel Impedance Value: 874 Ohm
Lead Channel Impedance Value: 893 Ohm
Lead Channel Impedance Value: 931 Ohm
Lead Channel Impedance Value: 950 Ohm
Lead Channel Pacing Threshold Amplitude: 0.375 V
Lead Channel Pacing Threshold Amplitude: 0.625 V
Lead Channel Pacing Threshold Pulse Width: 0.4 ms
Lead Channel Pacing Threshold Pulse Width: 0.4 ms
Lead Channel Sensing Intrinsic Amplitude: 0.8 mV
Lead Channel Sensing Intrinsic Amplitude: 13.8 mV
Lead Channel Setting Pacing Amplitude: 1.25 V
Lead Channel Setting Pacing Amplitude: 1.5 V
Lead Channel Setting Pacing Amplitude: 2 V
Lead Channel Setting Pacing Pulse Width: 0.4 ms
Lead Channel Setting Pacing Pulse Width: 0.4 ms
Lead Channel Setting Sensing Sensitivity: 0.3 mV
Zone Setting Status: 755011
Zone Setting Status: 755011

## 2022-12-24 ENCOUNTER — Ambulatory Visit: Payer: Medicare HMO | Attending: Cardiology | Admitting: Cardiology

## 2022-12-24 ENCOUNTER — Encounter: Payer: Self-pay | Admitting: Cardiology

## 2022-12-24 VITALS — BP 124/78 | HR 84 | Ht 72.0 in | Wt 197.4 lb

## 2022-12-24 DIAGNOSIS — I5022 Chronic systolic (congestive) heart failure: Secondary | ICD-10-CM

## 2022-12-24 DIAGNOSIS — I4819 Other persistent atrial fibrillation: Secondary | ICD-10-CM

## 2022-12-24 DIAGNOSIS — I255 Ischemic cardiomyopathy: Secondary | ICD-10-CM | POA: Diagnosis not present

## 2022-12-24 DIAGNOSIS — I472 Ventricular tachycardia, unspecified: Secondary | ICD-10-CM | POA: Diagnosis not present

## 2022-12-24 NOTE — Progress Notes (Signed)
Electrophysiology Office Note:   Date:  12/24/2022  ID:  [name], DOB Apr 21, 1932, MRN [id number]  Primary Cardiologist: Armanda Magic, MD Electrophysiologist: Regan Lemming, MD      History of Present Illness:   [name] is a 89 y.o. male with h/o CKD stage IV, hypertension, hyperlipidemia, coronary artery disease, chronic systolic heart failure, coronary artery disease, atrial fibrillation, aortic stenosis status post AVR seen today for routine electrophysiology followup.   Since last being seen in our clinic the patient reports continued weakness and fatigue.  He does get short of breath from time to time, he attributes this to his atrial fibrillation and heart failure.  Despite this, he has been making adjustments to his medications.  He does state that his blood pressure after taking his medicines at times has been quite low, in the 70s which makes him feel fatigued.  Despite this, he does not want to make any changes to his medication regimen.  he denies chest pain, palpitations, dyspnea, PND, orthopnea, nausea, vomiting, dizziness, syncope, edema, weight gain, or early satiety.   Review of systems complete and found to be negative unless listed in HPI.      EP Information / Studies Reviewed:    EKG is ordered today. Personal review as below.      ICD Interrogation-  reviewed in detail today,  See PACEART report.  Device History: Medtronic BiV ICD implanted 04/2015 for CHF History of appropriate therapy: Yes History of AAD therapy: Yes; currently on Mexiletine, sotalol    Risk Assessment/Calculations:    CHA2DS2-VASc Score = 5   This indicates a 7.2% annual risk of stroke. The patient's score is based upon: CHF History: 1 HTN History: 1 Diabetes History: 0 Stroke History: 0 Vascular Disease History: 1 Age Score: 2 Gender Score: 0             Physical Exam:   VS:  BP 124/78 (BP Location: Right Arm, Patient Position: Sitting, Cuff Size: Large)    Pulse 84   Ht 6' (1.829 m)   Wt 197 lb 6.4 oz (89.5 kg)   SpO2 98%   BMI 26.77 kg/m    Wt Readings from Last 3 Encounters:  12/24/22 197 lb 6.4 oz (89.5 kg)  09/24/22 192 lb (87.1 kg)  09/18/22 188 lb (85.3 kg)     GEN: Well nourished, well developed in no acute distress NECK: No JVD; No carotid bruits CARDIAC: Regular rate and rhythm, no murmurs, rubs, gallops RESPIRATORY:  Clear to auscultation without rales, wheezing or rhonchi  ABDOMEN: Soft, non-tender, non-distended EXTREMITIES:  No edema; No deformity   ASSESSMENT AND PLAN:    Chronic systolic dysfunction s/p Medtronic CRT-D  euvolemic today Stable on an appropriate medical regimen Normal ICD function See Pace Art report No changes today  2.  Ventricular tachycardia: Currently on sotalol and mexiletine.  On device interrogation  3.  Chronic systolic heart failure: Due to ischemic cardiomyopathy.  Euvolemic on exam.  Plan per primary cardiology.  4.  Valvular heart disease: Post bioprosthetic AVR.  Plan per primary cardiology.  4.  Coronary artery disease: No current chest pain.  Plan per primary cardiology Disposition:   Follow up with EP APP in 6 months   Signed, [name] Waymire Jorja Loa, MD

## 2022-12-24 NOTE — Patient Instructions (Signed)
Medication Instructions:  Your physician recommends that you continue on your current medications as directed. Please refer to the Current Medication list given to you today.  *If you need a refill on your cardiac medications before your next appointment, please call your pharmacy*   Lab Work: None ordered   Testing/Procedures: None ordered   Follow-Up: At [facility], you and your health needs are our priority.  As part of our continuing mission to provide you with exceptional heart care, we have created designated Provider Care Teams.  These Care Teams include your primary Cardiologist (physician) and Advanced Practice Providers (APPs -  Physician Assistants and Nurse Practitioners) who all work together to provide you with the care you need, when you need it.  Your next appointment:   6 month(s)  The format for your next appointment:   In Person  Provider:   You will see one of the following Advanced Practice Providers on your designated Care Team:   Francis Dowse, [place] "Mardelle Matte" [place], [place] Canary Brim, NP  Thank you for choosing [facility]!!   Dory Horn, RN [phone]

## 2023-01-04 NOTE — Progress Notes (Signed)
Remote ICD transmission.   

## 2023-02-02 DIAGNOSIS — I959 Hypotension, unspecified: Secondary | ICD-10-CM | POA: Diagnosis not present

## 2023-02-02 DIAGNOSIS — Z03818 Encounter for observation for suspected exposure to other biological agents ruled out: Secondary | ICD-10-CM | POA: Diagnosis not present

## 2023-02-02 DIAGNOSIS — R051 Acute cough: Secondary | ICD-10-CM | POA: Diagnosis not present

## 2023-02-09 ENCOUNTER — Encounter (HOSPITAL_BASED_OUTPATIENT_CLINIC_OR_DEPARTMENT_OTHER): Payer: Self-pay | Admitting: Cardiology

## 2023-02-10 ENCOUNTER — Encounter: Payer: Self-pay | Admitting: Cardiology

## 2023-02-13 ENCOUNTER — Telehealth: Payer: Self-pay

## 2023-02-13 DIAGNOSIS — E785 Hyperlipidemia, unspecified: Secondary | ICD-10-CM

## 2023-02-13 NOTE — Telephone Encounter (Signed)
Patient Advocate Encounter   The patient was approved for a [employer] grant that will help cover the cost of VASCEPA Total amount awarded, $2500.  Effective: 01/23/23 - 01/22/24   [id number]  Haze Rushing, CPhT  Pharmacy Patient Advocate Specialist  Direct Number: [phone] Fax: [phone]

## 2023-02-13 NOTE — Telephone Encounter (Signed)
Please send in prescription for VASCEPA to [id number] - [place],  - [address]  [address] [place] [address]  including grant billing information in RX comment [phone], QIO:[id number], Group: [id number], UU:[id number])

## 2023-02-15 MED ORDER — VASCEPA 1 G PO CAPS
ORAL_CAPSULE | ORAL | 3 refills | Status: DC
Start: 2023-02-15 — End: 2023-04-22

## 2023-02-15 NOTE — Telephone Encounter (Signed)
Prescription sent to pharmacy.

## 2023-03-09 ENCOUNTER — Other Ambulatory Visit: Payer: Self-pay | Admitting: Cardiology

## 2023-03-11 ENCOUNTER — Telehealth: Payer: Self-pay

## 2023-03-11 NOTE — Telephone Encounter (Signed)
Alert received from [employer]:  Alert remote transmission: VT with ATP There was one short NSVT.  There were 3 VT episodes that were successfully converted with 1-2 bursts of ATP, sent to triage high priority Known ongoing AF/AFL, on [facility]

## 2023-03-11 NOTE — Telephone Encounter (Signed)
Outreach made to Pt.  He denies any symptoms related to episode.  States he was asleep.   He is compliant with carvedilol, mexiletine and sotalol.  Pt with persistent afib with BIV device.    Per Pt he does not drive.  Advised of DMV guidelines.  Advised would forward to physician for review to evaluate for afib with RVR vs VT.

## 2023-03-12 NOTE — Telephone Encounter (Signed)
Strips reviewed by Dr. Elberta Fortis.  Strips appear to be Afib with RVR, not VT.  Outreach made to Pt.  Pt advised of above.  Advised no driving restrictions related to this alert.  Advised no change in tx at this time.  Pt indicates understanding.

## 2023-03-14 DIAGNOSIS — N1832 Chronic kidney disease, stage 3b: Secondary | ICD-10-CM | POA: Diagnosis not present

## 2023-03-15 ENCOUNTER — Encounter (HOSPITAL_COMMUNITY): Payer: Self-pay

## 2023-03-15 ENCOUNTER — Emergency Department (HOSPITAL_COMMUNITY)
Admission: EM | Admit: 2023-03-15 | Discharge: 2023-03-16 | Disposition: A | Discharge status: Home / Self Care | Payer: Medicare HMO | Attending: Emergency Medicine | Admitting: Emergency Medicine

## 2023-03-15 ENCOUNTER — Other Ambulatory Visit: Payer: Self-pay

## 2023-03-15 ENCOUNTER — Emergency Department (HOSPITAL_COMMUNITY): Payer: Medicare HMO

## 2023-03-15 DIAGNOSIS — R531 Weakness: Secondary | ICD-10-CM | POA: Diagnosis not present

## 2023-03-15 DIAGNOSIS — Z7901 Long term (current) use of anticoagulants: Secondary | ICD-10-CM | POA: Diagnosis not present

## 2023-03-15 DIAGNOSIS — Z79899 Other long term (current) drug therapy: Secondary | ICD-10-CM | POA: Diagnosis not present

## 2023-03-15 DIAGNOSIS — R0689 Other abnormalities of breathing: Secondary | ICD-10-CM | POA: Diagnosis not present

## 2023-03-15 DIAGNOSIS — I2510 Atherosclerotic heart disease of native coronary artery without angina pectoris: Secondary | ICD-10-CM | POA: Diagnosis not present

## 2023-03-15 DIAGNOSIS — N189 Chronic kidney disease, unspecified: Secondary | ICD-10-CM | POA: Diagnosis not present

## 2023-03-15 DIAGNOSIS — I5022 Chronic systolic (congestive) heart failure: Secondary | ICD-10-CM | POA: Insufficient documentation

## 2023-03-15 DIAGNOSIS — Z95810 Presence of automatic (implantable) cardiac defibrillator: Secondary | ICD-10-CM | POA: Diagnosis not present

## 2023-03-15 DIAGNOSIS — I951 Orthostatic hypotension: Secondary | ICD-10-CM | POA: Insufficient documentation

## 2023-03-15 DIAGNOSIS — I130 Hypertensive heart and chronic kidney disease with heart failure and stage 1 through stage 4 chronic kidney disease, or unspecified chronic kidney disease: Secondary | ICD-10-CM | POA: Insufficient documentation

## 2023-03-15 DIAGNOSIS — I129 Hypertensive chronic kidney disease with stage 1 through stage 4 chronic kidney disease, or unspecified chronic kidney disease: Secondary | ICD-10-CM | POA: Diagnosis not present

## 2023-03-15 DIAGNOSIS — N184 Chronic kidney disease, stage 4 (severe): Secondary | ICD-10-CM | POA: Diagnosis not present

## 2023-03-15 DIAGNOSIS — R000 Tachycardia, unspecified: Secondary | ICD-10-CM | POA: Diagnosis not present

## 2023-03-15 DIAGNOSIS — I959 Hypotension, unspecified: Secondary | ICD-10-CM | POA: Diagnosis not present

## 2023-03-15 LAB — CBC
HCT: 39.7 % (ref 39.0–52.0)
Hemoglobin: 12.7 g/dL — ABNORMAL LOW (ref 13.0–17.0)
MCH: 30.1 pg (ref 26.0–34.0)
MCHC: 32 g/dL (ref 30.0–36.0)
MCV: 94.1 fL (ref 80.0–100.0)
Platelets: 248 10*3/uL (ref 150–400)
RBC: 4.22 MIL/uL (ref 4.22–5.81)
RDW: 14.2 % (ref 11.5–15.5)
WBC: 11.8 10*3/uL — ABNORMAL HIGH (ref 4.0–10.5)
nRBC: 0 % (ref 0.0–0.2)

## 2023-03-15 LAB — BASIC METABOLIC PANEL
Anion gap: 11 (ref 5–15)
BUN: 27 mg/dL — ABNORMAL HIGH (ref 8–23)
CO2: 22 mmol/L (ref 22–32)
Calcium: 8.7 mg/dL — ABNORMAL LOW (ref 8.9–10.3)
Chloride: 104 mmol/L (ref 98–111)
Creatinine, Ser: 2.3 mg/dL — ABNORMAL HIGH (ref 0.61–1.24)
GFR, Estimated: 27 mL/min — ABNORMAL LOW (ref >60)
Glucose, Bld: 196 mg/dL — ABNORMAL HIGH (ref 70–99)
Potassium: 4.3 mmol/L (ref 3.5–5.1)
Sodium: 137 mmol/L (ref 135–145)

## 2023-03-15 LAB — CBG MONITORING, ED: Glucose-Capillary: 193 mg/dL — ABNORMAL HIGH (ref 70–99)

## 2023-03-15 LAB — TROPONIN I (HIGH SENSITIVITY)
Troponin I (High Sensitivity): 16 ng/L (ref <18)
Troponin I (High Sensitivity): 11 ng/L (ref <18)

## 2023-03-15 NOTE — ED Triage Notes (Signed)
Pt is coming in for weakness with an onset of occurring today, he usually walks with a walker but was unable to get up today at all without having near syncope. He has generally been weakness for a few nights but tonight it got significantly worse. Medic reports in route the patient had a episode of potential VTACH, but the patient does have a Medtronic pacer in place that is used for Afib. Pt is alert and oriented and otherwise stable at this time.   Medic Vitals   148/90 216bgl 98%ra 18rr  18g left ac

## 2023-03-15 NOTE — ED Notes (Addendum)
CALLED [employer] FOR FOLLOW UP. CALLED THEY CONFIRMED DATA WAS RECEIVED AND INFORMATION FAXED. EDP COMMUNICATED WITH [employer]

## 2023-03-15 NOTE — ED Notes (Signed)
Medtronic Pacemaker interrogated and data sent

## 2023-03-15 NOTE — ED Provider Notes (Signed)
[place] EMERGENCY DEPARTMENT AT [facility] Provider Note   CSN: [id number] Arrival date & time: 03/15/23  2019     History  Chief Complaint  Patient presents with   Weakness    [name] is a 89 y.o. male.  Pt is a 89y/o male with h/o CKD stage IV, hypertension, hyperlipidemia, coronary artery disease, chronic systolic heart failure, coronary artery disease, atrial fibrillation, aortic stenosis status post AVR who is presenting today due to complaints of intermittent episodes of feeling weak to where his legs will just go out on him.  He reports feeling short of breath seems to be worse with exertion but this has been going on for some time.  He has not had new cough, congestion, chest pain.  He does not think his defibrillator has fired.  He does report a syncopal event 2 weeks ago resulting in a cut on his arm.  Today's episode he reports his legs got weak and his wife had to put a rollator underneath him but he did not pass out.  He reports that he takes his blood pressure medications in the morning and by about 9:00 his blood pressure is very low sometimes 70s systolic.  He states he then has to drink fluids throughout the morning and can usually get it up by noon but then it starts dropping in the afternoon again.  He denies any urinary issues.  He has not had any swelling in his legs.  No recent medication changes.  The history is provided by the patient and medical records.  Weakness      Home Medications Prior to Admission medications   Medication Sig Start Date End Date Taking? Authorizing Provider  acetaminophen (TYLENOL) 500 MG tablet Take 1,000 mg by mouth every 6 (six) hours as needed for moderate pain or headache.    [provider]  albuterol (VENTOLIN HFA) 108 (90 Base) MCG/ACT inhaler Inhale 1-2 puffs into the lungs every 6 (six) hours as needed for wheezing or shortness of breath. 05/09/20   [provider]  amoxicillin (AMOXIL) 500  MG capsule Take 4 capsules (2,000mg ) by mouth 30-60 minutes prior to dental work/cleanings. 10/02/22   Camnitz, Andree Coss, MD  atorvastatin (LIPITOR) 40 MG tablet Take 1 tablet (40 mg total) by mouth daily. 10/08/22   Quintella Reichert, MD  carvedilol (COREG) 25 MG tablet TAKE 1 TABLET TWICE DAILY WITH A MEAL (DOSE INCREASE) PLEASE KEEP NOVEMBER APPOINTMENT FOR FUTURE REFILLS 10/08/22   Quintella Reichert, MD  ELIQUIS 2.5 MG TABS tablet TAKE 1 TABLET TWICE DAILY 07/02/22   Quintella Reichert, MD  famotidine (PEPCID) 40 MG tablet Take 40 mg by mouth every evening. 07/01/18   [provider]  furosemide (LASIX) 40 MG tablet TAKE 1 TABLET EVERY DAY AS NEEDED FOR EDEMA Patient taking differently: Take 20 mg by mouth daily. 07/02/22   Quintella Reichert, MD  hydrALAZINE (APRESOLINE) 25 MG tablet TAKE 1/2 TABLET BY MOUTH IN THE MORNING AND AT BEDTIME 04/25/21   Quintella Reichert, MD  isosorbide mononitrate (IMDUR) 30 MG 24 hr tablet TAKE 1 TABLET EVERY DAY. PLEASE KEEP NOVEMBER APPOINTMENT FOR FUTURE REFILLS 10/08/22   Quintella Reichert, MD  mexiletine (MEXITIL) 150 MG capsule TAKE 2 CAPSULES BY MOUTH EVERY 12 HOURS. 03/13/23   Quintella Reichert, MD  Multiple Vitamin (MULTIVITAMIN WITH MINERALS) TABS tablet Take 1 tablet by mouth in the morning.    [provider]  pantoprazole (PROTONIX) 40  MG tablet Take 40 mg by mouth daily before breakfast.    [provider]  Polyethyl Glycol-Propyl Glycol (SYSTANE OP) Place 1 drop into both eyes 3 (three) times daily as needed (dry/irritated eyes.).    [provider]  sodium bicarbonate 650 MG tablet Take 650 mg by mouth in the morning.    [provider]  sotalol (BETAPACE) 120 MG tablet TAKE 1 TABLET EVERY DAY 10/08/22   Camnitz, Andree Coss, MD  VASCEPA 1 g capsule TAKE 2 CAPSULES BY MOUTH 2 TIMES DAILY. 02/15/23   Quintella Reichert, MD      Allergies    Ranolazine, Procaine, and Amiodarone    Review of Systems   Review of Systems   Neurological:  Positive for weakness.    Physical Exam Updated Vital Signs BP 129/65   Pulse 74   Temp 98.1 F (36.7 C) (Oral)   Resp 17   SpO2 100%  Physical Exam Vitals and nursing note reviewed.  Constitutional:      General: He is not in acute distress.    Appearance: He is well-developed.     Comments: Chronically ill-appearing  HENT:     Head: Normocephalic and atraumatic.     Mouth/Throat:     Mouth: Mucous membranes are moist.  Eyes:     Conjunctiva/sclera: Conjunctivae normal.     Pupils: Pupils are equal, round, and reactive to light.  Cardiovascular:     Rate and Rhythm: Normal rate. Rhythm irregular.     Heart sounds: Murmur heard.  Pulmonary:     Effort: Pulmonary effort is normal. No respiratory distress.     Breath sounds: Normal breath sounds. No wheezing or rales.     Comments: Pacemaker/defibrillator present in the left upper chest Abdominal:     General: There is no distension.     Palpations: Abdomen is soft.     Tenderness: There is no abdominal tenderness. There is no guarding or rebound.  Musculoskeletal:        General: No tenderness. Normal range of motion.     Cervical back: Normal range of motion and neck supple.     Right lower leg: No edema.     Left lower leg: No edema.  Skin:    General: Skin is warm and dry.     Findings: No erythema or rash.  Neurological:     Mental Status: He is alert and oriented to person, place, and time.  Psychiatric:        Behavior: Behavior normal.     ED Results / Procedures / Treatments   Labs (all labs ordered are listed, but only abnormal results are displayed) Labs Reviewed  BASIC METABOLIC PANEL - Abnormal; Notable for the following components:      Result Value   Glucose, Bld 196 (*)    BUN 27 (*)    Creatinine, Ser 2.30 (*)    Calcium 8.7 (*)    GFR, Estimated 27 (*)    All other components within normal limits  CBC - Abnormal; Notable for the following components:   WBC 11.8 (*)     Hemoglobin 12.7 (*)    All other components within normal limits  CBG MONITORING, ED - Abnormal; Notable for the following components:   Glucose-Capillary 193 (*)    All other components within normal limits  URINALYSIS, ROUTINE W REFLEX MICROSCOPIC  TROPONIN I (HIGH SENSITIVITY)  TROPONIN I (HIGH SENSITIVITY)    EKG None ED ECG REPORT  Date: 03/15/2023  Rate: paced rhythm  Rhythm:  paced  QRS Axis: indeterminate  Intervals:  paced  ST/T Wave abnormalities: nonspecific ST/T changes  Conduction Disutrbances:none  Narrative Interpretation:   Old EKG Reviewed: unchanged  I have personally reviewed the EKG tracing and agree with the computerized printout as noted.   Radiology DG Chest Portable 1 View  Result Date: 03/15/2023 CLINICAL DATA:  Generalized weakness EXAM: PORTABLE CHEST 1 VIEW COMPARISON:  09/18/2019 FINDINGS: Left AICD remains in place, unchanged. Prior CABG. Heart and mediastinal contours are within normal limits. No focal opacities or effusions. No acute bony abnormality. IMPRESSION: No active cardiopulmonary disease. Electronically Signed   By: Charlett Nose M.D.   On: 03/15/2023 20:47    Procedures Procedures    Medications Ordered in ED Medications - No data to display  ED Course/ Medical Decision Making/ A&P                                 Medical Decision Making Amount and/or Complexity of Data Reviewed Labs: ordered. Decision-making details documented in ED Course. Radiology: ordered and independent interpretation performed. Decision-making details documented in ED Course. ECG/medicine tests: ordered and independent interpretation performed. Decision-making details documented in ED Course.   Pt with multiple medical problems and comorbidities and presenting today with a complaint that caries a high risk for morbidity and mortality.  Here today with intermittent episodes of weakness where his legs will give out and he feels extreme fatigue and some  shortness of breath.  Last episode occurred prior to arrival tonight.  Patient does report episodes of hypotension which could be the cause of his symptoms but EMS also noticed that he had a run of V. tach today while on the monitor.  Patient denies any infectious symptoms and is not having significant findings for fluid overload.  He does not think his defibrillator has fired but did have a syncopal event 2 weeks ago.  Suspicion for PE as patient is chronically anticoagulated and has not had any recent immobilization and denies any pain or swelling in the leg.  I independently interpreted patient's labs and EKG.  EKG showed a paced rhythm today, troponin flat within normal limits, BMP with persistent chronic renal failure with a creatinine today of 2.3 seems to be similar to baseline.  CBC with minimal leukocytosis of 11 and stable hemoglobin of 12.  I have independently visualized and interpreted pt's images today. Chest x-ray without acute findings today.  Patient's pacemaker was interrogated and did report 3 VT episodes the most recent one was a few hours ago but they have all been able to be terminated by pacing.  No shocks were fired.  Patient is otherwise more persistently in chronic atrial fibrillation which family confirms.  Spoke with cardiology and and they felt that patient's symptoms are most likely blood pressure related.  They felt that he could discontinue his hydralazine to see if that helps him to feel better.  Discussing this with the patient his biggest concern is that he was so weak this evening he was not even able to walk to the stretcher with EMS.  He normally lives at home independently.  Will do orthostatics on patient and see if he is able to ambulate.  If not he will need admission for blood pressure medication adjustment to ensure he is able to get up and walk.        Final Clinical  Impression(s) / ED Diagnoses Final diagnoses:  Weakness  Orthostatic hypotension  Chronic  kidney disease, unspecified CKD stage    Rx / DC Orders ED Discharge Orders     None         Gwyneth Sprout, MD 03/15/23 2357

## 2023-03-16 DIAGNOSIS — D6869 Other thrombophilia: Secondary | ICD-10-CM | POA: Diagnosis not present

## 2023-03-16 DIAGNOSIS — Z951 Presence of aortocoronary bypass graft: Secondary | ICD-10-CM | POA: Diagnosis not present

## 2023-03-16 DIAGNOSIS — Z4502 Encounter for adjustment and management of automatic implantable cardiac defibrillator: Secondary | ICD-10-CM | POA: Diagnosis not present

## 2023-03-16 DIAGNOSIS — Z820 Family history of epilepsy and other diseases of the nervous system: Secondary | ICD-10-CM | POA: Diagnosis not present

## 2023-03-16 DIAGNOSIS — I5022 Chronic systolic (congestive) heart failure: Secondary | ICD-10-CM | POA: Diagnosis present

## 2023-03-16 DIAGNOSIS — Z7189 Other specified counseling: Secondary | ICD-10-CM | POA: Diagnosis not present

## 2023-03-16 DIAGNOSIS — Z515 Encounter for palliative care: Secondary | ICD-10-CM | POA: Diagnosis not present

## 2023-03-16 DIAGNOSIS — I420 Dilated cardiomyopathy: Secondary | ICD-10-CM | POA: Diagnosis present

## 2023-03-16 DIAGNOSIS — Z9842 Cataract extraction status, left eye: Secondary | ICD-10-CM | POA: Diagnosis not present

## 2023-03-16 DIAGNOSIS — R079 Chest pain, unspecified: Secondary | ICD-10-CM | POA: Diagnosis not present

## 2023-03-16 DIAGNOSIS — I2721 Secondary pulmonary arterial hypertension: Secondary | ICD-10-CM | POA: Diagnosis present

## 2023-03-16 DIAGNOSIS — R2231 Localized swelling, mass and lump, right upper limb: Secondary | ICD-10-CM | POA: Diagnosis not present

## 2023-03-16 DIAGNOSIS — I255 Ischemic cardiomyopathy: Secondary | ICD-10-CM | POA: Diagnosis not present

## 2023-03-16 DIAGNOSIS — Z818 Family history of other mental and behavioral disorders: Secondary | ICD-10-CM | POA: Diagnosis not present

## 2023-03-16 DIAGNOSIS — I2510 Atherosclerotic heart disease of native coronary artery without angina pectoris: Secondary | ICD-10-CM | POA: Diagnosis present

## 2023-03-16 DIAGNOSIS — Z888 Allergy status to other drugs, medicaments and biological substances status: Secondary | ICD-10-CM | POA: Diagnosis not present

## 2023-03-16 DIAGNOSIS — Z83438 Family history of other disorder of lipoprotein metabolism and other lipidemia: Secondary | ICD-10-CM | POA: Diagnosis not present

## 2023-03-16 DIAGNOSIS — N184 Chronic kidney disease, stage 4 (severe): Secondary | ICD-10-CM | POA: Diagnosis present

## 2023-03-16 DIAGNOSIS — E785 Hyperlipidemia, unspecified: Secondary | ICD-10-CM | POA: Diagnosis present

## 2023-03-16 DIAGNOSIS — Z8249 Family history of ischemic heart disease and other diseases of the circulatory system: Secondary | ICD-10-CM | POA: Diagnosis not present

## 2023-03-16 DIAGNOSIS — Z961 Presence of intraocular lens: Secondary | ICD-10-CM | POA: Diagnosis present

## 2023-03-16 DIAGNOSIS — Z953 Presence of xenogenic heart valve: Secondary | ICD-10-CM | POA: Diagnosis not present

## 2023-03-16 DIAGNOSIS — I4819 Other persistent atrial fibrillation: Secondary | ICD-10-CM | POA: Diagnosis not present

## 2023-03-16 DIAGNOSIS — I472 Ventricular tachycardia, unspecified: Secondary | ICD-10-CM | POA: Diagnosis present

## 2023-03-16 DIAGNOSIS — I4821 Permanent atrial fibrillation: Secondary | ICD-10-CM | POA: Diagnosis present

## 2023-03-16 DIAGNOSIS — I130 Hypertensive heart and chronic kidney disease with heart failure and stage 1 through stage 4 chronic kidney disease, or unspecified chronic kidney disease: Secondary | ICD-10-CM | POA: Diagnosis present

## 2023-03-16 DIAGNOSIS — Z79899 Other long term (current) drug therapy: Secondary | ICD-10-CM | POA: Diagnosis not present

## 2023-03-16 DIAGNOSIS — Z825 Family history of asthma and other chronic lower respiratory diseases: Secondary | ICD-10-CM | POA: Diagnosis not present

## 2023-03-16 DIAGNOSIS — Z9841 Cataract extraction status, right eye: Secondary | ICD-10-CM | POA: Diagnosis not present

## 2023-03-16 DIAGNOSIS — Z95810 Presence of automatic (implantable) cardiac defibrillator: Secondary | ICD-10-CM | POA: Diagnosis not present

## 2023-03-16 LAB — URINALYSIS, ROUTINE W REFLEX MICROSCOPIC
Bilirubin Urine: NEGATIVE
Glucose, UA: NEGATIVE mg/dL
Hgb urine dipstick: NEGATIVE
Ketones, ur: NEGATIVE mg/dL
Leukocytes,Ua: NEGATIVE
Nitrite: NEGATIVE
Protein, ur: NEGATIVE mg/dL
Specific Gravity, Urine: 1.016 (ref 1.005–1.030)
pH: 5 (ref 5.0–8.0)

## 2023-03-16 NOTE — ED Provider Notes (Signed)
I assumed care of this patient from previous provider.  Please see their note for further details of history, exam, and MDM.   Briefly patient is a 89 y.o. male who presented bilateral lower extremity weakness. Work up thus far reassuring. Thought to be orthostasis.  Plan to reassess. Patient's orthostatics were slightly positive and patient was symptomatic.  He was allowed to rest for several hours and on reexamination, patient was able to ambulate.  Stable for discharge home with discontinuation of hydralazine and close follow-up with cardiology.  The patient appears reasonably screened and/or stabilized for discharge and I doubt any other medical condition or other [facility] requiring further screening, evaluation, or treatment in the ED at this time. I have discussed the findings, Dx and Tx plan with the patient/family who expressed understanding and agree(s) with the plan. Discharge instructions discussed at length. The patient/family was given strict return precautions who verbalized understanding of the instructions. No further questions at time of discharge.  Disposition: Discharge  Condition: Good  ED Discharge Orders     None         Follow Up: Quintella Reichert, MD [address] [place] [address] [phone]  Call  to schedule an appointment for close follow up  Joycelyn Rua, MD [address] [place] [address] [phone]  Call  to schedule an appointment for close follow up         Nira Conn, MD 03/16/23 [phone]

## 2023-03-16 NOTE — ED Notes (Signed)
Delorise Jackson notified at patient's request to pick up patient and transport him home. Mellody Dance stated he would be here at ED to pick up patient at around 0730.

## 2023-03-16 NOTE — Discharge Instructions (Signed)
Stop taking Hydralazine (Apresoline) to see if this help keep your blood pressure from dropping too low. Please call your cardiologist to follow up closely.

## 2023-03-18 NOTE — Progress Notes (Unsigned)
Electrophysiology Office Note:   Date:  03/18/2023  ID:  [name], DOB 01/01/1933, MRN [id number]  Primary Cardiologist: Armanda Magic, MD Electrophysiologist: Regan Lemming, MD  {Click to update primary MD,subspecialty MD or APP then REFRESH:1}    History of Present Illness:   [name] is a 89 y.o. male with h/o Chronic Combined Systolic/Diastolic CHF /, ICM s/p MDT CRT-D, AS s/p bioprosthetic AVR, AF on anticoagulation,HTN, HLD,CAD, hyperthyroidism, CKD4, R Pleural Effusion s/p PleurX s/p removal seen today for post hospital follow up.    He was seen in the ER on 03/16/23 with weakness and dizziness thought to be orthostatic in nature. His hydralazine was stopped and he was recommended to follow up in Cardiology office. He reported in ER that he had a syncopal episode 2 weeks prior. His device was interrogated in the ER and it showed 3 episodes of VT that were terminated by pacing. No HV therapy delivered.   Since discharge from hospital the patient reports doing ***.  he denies chest pain, palpitations, dyspnea, PND, orthopnea, nausea, vomiting, dizziness, syncope, edema, weight gain, or early satiety.   Review of systems complete and found to be negative unless listed in HPI.    EP Information / Studies Reviewed:    EKG is ordered today. Personal review as below.      ICD Interrogation-  reviewed in detail today,  See PACEART report.  Device History: Medtronic BiV ICD implanted 04/22/15, gen change 09/18/22 for sCHF / NICM  History of appropriate therapy: No History of AAD therapy: Yes; currently on sotalol, mexiletine. See below.     Studies:   LHC 2019 > 3v CAD with stable revascularization, moderate pH with mix of pulmonary venous/arterial HTN (WHO 2, 3), normal CO EKG 07/19/22 > AF, VP EKG 09/24/22 > personally reviewed, VP, QT 440, QTc 521 (minus QRS ~40)   Device information: MDT CRT-D, implanted 04/22/15 > gen change 09/18/22. Hx numerous appropriate  therapies VT Device Interrogation 09/24/22 > presenting aflutter with BVP, 9.5 years battery life, no VT, 96% BVpaced, 100% Aflutter   AAD Hx: Amiodarone stopped Jan 2019, 2/2 SOB,  No clear finding of amio tox > mexiletine started. More VT and sotalol was added Dec 2020  Risk Assessment/Calculations:    CHA2DS2-VASc Score = 5  {Confirm score is correct.  If not, click here to update score.  REFRESH note.  :1} This indicates a 7.2% annual risk of stroke. The patient's score is based upon: CHF History: 1 HTN History: 1 Diabetes History: 0 Stroke History: 0 Vascular Disease History: 1 Age Score: 2 Gender Score: 0   {This patient has a significant risk of stroke if diagnosed with atrial fibrillation.  Please consider VKA or DOAC agent for anticoagulation if the bleeding risk is acceptable.   You can also use the SmartPhrase .[email or web address] for documentation.   :[id number]} No BP recorded.  {Refresh Note OR Click here to enter BP  :1}***        Physical Exam:   VS:  There were no vitals taken for this visit.   Wt Readings from Last 3 Encounters:  12/24/22 197 lb 6.4 oz (89.5 kg)  09/24/22 192 lb (87.1 kg)  09/18/22 188 lb (85.3 kg)     GEN: Well nourished, well developed in no acute distress NECK: No JVD; No carotid bruits CARDIAC: {EPRHYTHM:28826}, no murmurs, rubs, gallops RESPIRATORY:  Clear to auscultation without rales, wheezing or rhonchi  ABDOMEN: Soft, non-tender, non-distended EXTREMITIES:  No edema;  No deformity   ASSESSMENT AND PLAN:    Chronic Systolic Dysfunction s/p Medtronic CRT-D  VT  LVEF 33% -euvolemic today -Stable on an appropriate medical regimen -continue sotalol, mexiletine  -labs from hospital reviewed > CrCl ***  -device site well healed *** -Normal ICD function -See Pace Art report -No changes today  Permanent Atrial Fibrillation  CHA2DS2-VASc 5.  Established permanent 07/2022  -continue coreg -continue OAC for stroke prophylaxis  -***% burden  on device   Secondary Hypercoagulable State  -continue Eliquis 2.5mg  BID, dose reviewed and appropriate    CAD  -follows with Dr. Mayford Knife  -continue BB, statin, ASA, OAC   ICM  -euvolemic on exam  -coreg, imdur, hydralazine, diuretics per Cardiology   Valvular Heart Disease s/p Bioprosthetic AVR  TTE shows AVR nunctioning well 2021  -per Dr. Mayford Knife   Hx Orthostatic Hypotension  -BP stable    Disposition:   Follow up with Dr. Elberta Fortis {EPFOLLOW RU:04540}   Signed, Canary Brim, MSN, APRN, NP-C, AGACNP-BC [facility] - Electrophysiology  03/18/2023, 5:00 PM

## 2023-03-19 ENCOUNTER — Encounter: Payer: Self-pay | Admitting: Pulmonary Disease

## 2023-03-19 ENCOUNTER — Other Ambulatory Visit: Payer: Self-pay

## 2023-03-19 ENCOUNTER — Inpatient Hospital Stay (HOSPITAL_COMMUNITY)
Admission: EM | Admit: 2023-03-19 | Discharge: 2023-04-02 | DRG: 309 | Disposition: A | Discharge status: Home Health | Payer: Medicare HMO | Attending: Cardiology | Admitting: Cardiology

## 2023-03-19 ENCOUNTER — Emergency Department (HOSPITAL_COMMUNITY): Payer: Medicare HMO

## 2023-03-19 ENCOUNTER — Encounter (HOSPITAL_COMMUNITY): Payer: Self-pay | Admitting: Cardiology

## 2023-03-19 ENCOUNTER — Ambulatory Visit: Payer: Medicare HMO | Attending: Physician Assistant | Admitting: Pulmonary Disease

## 2023-03-19 VITALS — BP 118/72 | HR 87 | Wt 197.0 lb

## 2023-03-19 DIAGNOSIS — Z818 Family history of other mental and behavioral disorders: Secondary | ICD-10-CM

## 2023-03-19 DIAGNOSIS — I5022 Chronic systolic (congestive) heart failure: Secondary | ICD-10-CM

## 2023-03-19 DIAGNOSIS — Z7189 Other specified counseling: Secondary | ICD-10-CM | POA: Diagnosis not present

## 2023-03-19 DIAGNOSIS — R2231 Localized swelling, mass and lump, right upper limb: Secondary | ICD-10-CM | POA: Diagnosis not present

## 2023-03-19 DIAGNOSIS — I2721 Secondary pulmonary arterial hypertension: Secondary | ICD-10-CM | POA: Diagnosis present

## 2023-03-19 DIAGNOSIS — Z79899 Other long term (current) drug therapy: Secondary | ICD-10-CM

## 2023-03-19 DIAGNOSIS — Z9842 Cataract extraction status, left eye: Secondary | ICD-10-CM | POA: Diagnosis not present

## 2023-03-19 DIAGNOSIS — I420 Dilated cardiomyopathy: Secondary | ICD-10-CM | POA: Diagnosis present

## 2023-03-19 DIAGNOSIS — R7303 Prediabetes: Secondary | ICD-10-CM | POA: Diagnosis present

## 2023-03-19 DIAGNOSIS — Z9841 Cataract extraction status, right eye: Secondary | ICD-10-CM

## 2023-03-19 DIAGNOSIS — N184 Chronic kidney disease, stage 4 (severe): Secondary | ICD-10-CM | POA: Diagnosis present

## 2023-03-19 DIAGNOSIS — Z825 Family history of asthma and other chronic lower respiratory diseases: Secondary | ICD-10-CM

## 2023-03-19 DIAGNOSIS — I472 Ventricular tachycardia, unspecified: Secondary | ICD-10-CM | POA: Diagnosis present

## 2023-03-19 DIAGNOSIS — Z961 Presence of intraocular lens: Secondary | ICD-10-CM | POA: Diagnosis present

## 2023-03-19 DIAGNOSIS — Z515 Encounter for palliative care: Secondary | ICD-10-CM | POA: Diagnosis not present

## 2023-03-19 DIAGNOSIS — Z953 Presence of xenogenic heart valve: Secondary | ICD-10-CM

## 2023-03-19 DIAGNOSIS — D6869 Other thrombophilia: Secondary | ICD-10-CM

## 2023-03-19 DIAGNOSIS — Z820 Family history of epilepsy and other diseases of the nervous system: Secondary | ICD-10-CM

## 2023-03-19 DIAGNOSIS — R079 Chest pain, unspecified: Secondary | ICD-10-CM | POA: Diagnosis not present

## 2023-03-19 DIAGNOSIS — I2510 Atherosclerotic heart disease of native coronary artery without angina pectoris: Secondary | ICD-10-CM | POA: Diagnosis present

## 2023-03-19 DIAGNOSIS — Z888 Allergy status to other drugs, medicaments and biological substances status: Secondary | ICD-10-CM

## 2023-03-19 DIAGNOSIS — Z8249 Family history of ischemic heart disease and other diseases of the circulatory system: Secondary | ICD-10-CM | POA: Diagnosis not present

## 2023-03-19 DIAGNOSIS — Z83438 Family history of other disorder of lipoprotein metabolism and other lipidemia: Secondary | ICD-10-CM

## 2023-03-19 DIAGNOSIS — Z4502 Encounter for adjustment and management of automatic implantable cardiac defibrillator: Secondary | ICD-10-CM

## 2023-03-19 DIAGNOSIS — E785 Hyperlipidemia, unspecified: Secondary | ICD-10-CM | POA: Diagnosis present

## 2023-03-19 DIAGNOSIS — I130 Hypertensive heart and chronic kidney disease with heart failure and stage 1 through stage 4 chronic kidney disease, or unspecified chronic kidney disease: Secondary | ICD-10-CM | POA: Diagnosis present

## 2023-03-19 DIAGNOSIS — R55 Syncope and collapse: Secondary | ICD-10-CM | POA: Diagnosis present

## 2023-03-19 DIAGNOSIS — I4819 Other persistent atrial fibrillation: Secondary | ICD-10-CM

## 2023-03-19 DIAGNOSIS — Z95810 Presence of automatic (implantable) cardiac defibrillator: Secondary | ICD-10-CM | POA: Diagnosis not present

## 2023-03-19 DIAGNOSIS — Z800 Family history of malignant neoplasm of digestive organs: Secondary | ICD-10-CM

## 2023-03-19 DIAGNOSIS — Z951 Presence of aortocoronary bypass graft: Secondary | ICD-10-CM

## 2023-03-19 DIAGNOSIS — I493 Ventricular premature depolarization: Secondary | ICD-10-CM | POA: Diagnosis present

## 2023-03-19 DIAGNOSIS — Z7901 Long term (current) use of anticoagulants: Secondary | ICD-10-CM

## 2023-03-19 DIAGNOSIS — I959 Hypotension, unspecified: Secondary | ICD-10-CM | POA: Diagnosis present

## 2023-03-19 DIAGNOSIS — I4821 Permanent atrial fibrillation: Secondary | ICD-10-CM | POA: Diagnosis present

## 2023-03-19 DIAGNOSIS — I255 Ischemic cardiomyopathy: Secondary | ICD-10-CM

## 2023-03-19 LAB — BASIC METABOLIC PANEL
Anion gap: 8 (ref 5–15)
BUN: 28 mg/dL — ABNORMAL HIGH (ref 8–23)
CO2: 25 mmol/L (ref 22–32)
Calcium: 9.2 mg/dL (ref 8.9–10.3)
Chloride: 105 mmol/L (ref 98–111)
Creatinine, Ser: 2.15 mg/dL — ABNORMAL HIGH (ref 0.61–1.24)
GFR, Estimated: 29 mL/min — ABNORMAL LOW (ref >60)
Glucose, Bld: 130 mg/dL — ABNORMAL HIGH (ref 70–99)
Potassium: 4.5 mmol/L (ref 3.5–5.1)
Sodium: 138 mmol/L (ref 135–145)

## 2023-03-19 LAB — CBC
HCT: 42.4 % (ref 39.0–52.0)
Hemoglobin: 13.4 g/dL (ref 13.0–17.0)
MCH: 29.6 pg (ref 26.0–34.0)
MCHC: 31.6 g/dL (ref 30.0–36.0)
MCV: 93.6 fL (ref 80.0–100.0)
Platelets: 289 10*3/uL (ref 150–400)
RBC: 4.53 MIL/uL (ref 4.22–5.81)
RDW: 14.2 % (ref 11.5–15.5)
WBC: 8.4 10*3/uL (ref 4.0–10.5)
nRBC: 0 % (ref 0.0–0.2)

## 2023-03-19 LAB — MAGNESIUM: Magnesium: 2.4 mg/dL (ref 1.7–2.4)

## 2023-03-19 LAB — TROPONIN I (HIGH SENSITIVITY)
Troponin I (High Sensitivity): 21 ng/L — ABNORMAL HIGH (ref <18)
Troponin I (High Sensitivity): 20 ng/L — ABNORMAL HIGH (ref <18)

## 2023-03-19 LAB — MRSA NEXT GEN BY PCR, NASAL: MRSA by PCR Next Gen: DETECTED — AB

## 2023-03-19 LAB — GLUCOSE, CAPILLARY: Glucose-Capillary: 88 mg/dL (ref 70–99)

## 2023-03-19 MED ORDER — FAMOTIDINE 20 MG PO TABS
20.0000 mg | ORAL_TABLET | Freq: Every evening | ORAL | Status: DC
  Administered 2023-03-19: 20 mg via ORAL
  Filled 2023-03-19: qty 1

## 2023-03-19 MED ORDER — ISOSORBIDE MONONITRATE ER 30 MG PO TB24
30.0000 mg | ORAL_TABLET | Freq: Every day | ORAL | Status: DC
  Administered 2023-03-20 – 2023-03-31 (×12): 30 mg via ORAL
  Filled 2023-03-19 (×12): qty 1

## 2023-03-19 MED ORDER — ORAL CARE MOUTH RINSE: 15.0000 mL | OROMUCOSAL | Status: DC | PRN

## 2023-03-19 MED ORDER — AMIODARONE LOAD VIA INFUSION
150.0000 mg | Freq: Once | INTRAVENOUS | Status: AC
  Administered 2023-03-19: 150 mg via INTRAVENOUS
  Filled 2023-03-19: qty 83.34

## 2023-03-19 MED ORDER — APIXABAN 2.5 MG PO TABS
2.5000 mg | ORAL_TABLET | Freq: Two times a day (BID) | ORAL | Status: DC
  Administered 2023-03-19 – 2023-04-02 (×28): 2.5 mg via ORAL
  Filled 2023-03-19 (×28): qty 1

## 2023-03-19 MED ORDER — ACETAMINOPHEN 325 MG PO TABS
650.0000 mg | ORAL_TABLET | ORAL | Status: DC | PRN
  Filled 2023-03-19 (×2): qty 2

## 2023-03-19 MED ORDER — CHLORHEXIDINE GLUCONATE 0.12 % MT SOLN
15.0000 mL | Freq: Once | OROMUCOSAL | Status: AC
  Administered 2023-03-19: 15 mL via OROMUCOSAL
  Filled 2023-03-19: qty 15

## 2023-03-19 MED ORDER — AMIODARONE HCL IN DEXTROSE 360-4.14 MG/200ML-% IV SOLN
30.0000 mg/h | INTRAVENOUS | Status: AC
  Administered 2023-03-20 – 2023-03-21 (×4): 30 mg/h via INTRAVENOUS
  Filled 2023-03-19 (×6): qty 200

## 2023-03-19 MED ORDER — SODIUM BICARBONATE 650 MG PO TABS
650.0000 mg | ORAL_TABLET | Freq: Every day | ORAL | Status: DC
  Administered 2023-03-20 – 2023-04-02 (×14): 650 mg via ORAL
  Filled 2023-03-19 (×14): qty 1

## 2023-03-19 MED ORDER — MEXILETINE HCL 150 MG PO CAPS
300.0000 mg | ORAL_CAPSULE | Freq: Two times a day (BID) | ORAL | Status: DC
  Administered 2023-03-19 – 2023-04-02 (×28): 300 mg via ORAL
  Filled 2023-03-19 (×30): qty 2

## 2023-03-19 MED ORDER — CHLORHEXIDINE GLUCONATE CLOTH 2 % EX PADS
6.0000 | MEDICATED_PAD | Freq: Every day | CUTANEOUS | Status: AC
  Administered 2023-03-20 – 2023-03-23 (×2): 6 via TOPICAL

## 2023-03-19 MED ORDER — ADULT MULTIVITAMIN W/MINERALS CH
1.0000 | ORAL_TABLET | Freq: Every day | ORAL | Status: DC
  Administered 2023-03-20 – 2023-04-02 (×14): 1 via ORAL
  Filled 2023-03-19 (×14): qty 1

## 2023-03-19 MED ORDER — FUROSEMIDE 20 MG PO TABS
20.0000 mg | ORAL_TABLET | Freq: Every day | ORAL | Status: DC
  Administered 2023-03-19 – 2023-04-02 (×15): 20 mg via ORAL
  Filled 2023-03-19 (×15): qty 1

## 2023-03-19 MED ORDER — NITROGLYCERIN 0.4 MG SL SUBL: 0.4000 mg | SUBLINGUAL_TABLET | SUBLINGUAL | Status: DC | PRN

## 2023-03-19 MED ORDER — PANTOPRAZOLE SODIUM 40 MG PO TBEC
40.0000 mg | DELAYED_RELEASE_TABLET | Freq: Every day | ORAL | Status: DC
  Administered 2023-03-20 – 2023-04-02 (×14): 40 mg via ORAL
  Filled 2023-03-19 (×14): qty 1

## 2023-03-19 MED ORDER — MUPIROCIN 2 % EX OINT
1.0000 | TOPICAL_OINTMENT | Freq: Two times a day (BID) | CUTANEOUS | Status: AC
  Administered 2023-03-19 – 2023-03-24 (×10): 1 via NASAL
  Filled 2023-03-19 (×4): qty 22

## 2023-03-19 MED ORDER — AMIODARONE HCL IN DEXTROSE 360-4.14 MG/200ML-% IV SOLN
60.0000 mg/h | INTRAVENOUS | Status: AC
  Administered 2023-03-19 (×2): 60 mg/h via INTRAVENOUS

## 2023-03-19 MED ORDER — ATORVASTATIN CALCIUM 40 MG PO TABS
40.0000 mg | ORAL_TABLET | Freq: Every day | ORAL | Status: DC
  Administered 2023-03-19 – 2023-04-02 (×15): 40 mg via ORAL
  Filled 2023-03-19 (×15): qty 1

## 2023-03-19 MED ORDER — CARVEDILOL 25 MG PO TABS
25.0000 mg | ORAL_TABLET | Freq: Two times a day (BID) | ORAL | Status: DC
  Administered 2023-03-19 – 2023-03-31 (×23): 25 mg via ORAL
  Filled 2023-03-19 (×23): qty 1

## 2023-03-19 MED ORDER — ALBUTEROL SULFATE (2.5 MG/3ML) 0.083% IN NEBU
3.0000 mL | INHALATION_SOLUTION | Freq: Four times a day (QID) | RESPIRATORY_TRACT | Status: DC | PRN
  Administered 2023-03-28: 3 mL via RESPIRATORY_TRACT
  Filled 2023-03-19: qty 3

## 2023-03-19 MED ORDER — CHLORHEXIDINE GLUCONATE CLOTH 2 % EX PADS: 6.0000 | MEDICATED_PAD | Freq: Every day | CUTANEOUS | Status: DC

## 2023-03-19 NOTE — ED Notes (Signed)
Pacemaker Interrogated. 

## 2023-03-19 NOTE — ED Notes (Signed)
ED TO INPATIENT HANDOFF REPORT  ED Nurse Name and Phone #: Melburn Popper Name/Age/Gender [name] 89 y.o. male Room/Bed: 024C/024C  Code Status   Code Status: Prior  Home/SNF/Other Home Patient oriented to: self, place, time, and situation Is this baseline? Yes   Triage Complete: Triage complete  Chief Complaint VT (ventricular tachycardia) (HCC) [I47.20]  Triage Note Pt. Stated, I had a Dr.'s appt this morning and they sent me here cause my pacer went off 8 times. I didn't have any symptoms I felt it twice.Ive had it for several years   Allergies Allergies  Allergen Reactions   Ranolazine Other (See Comments)    States he went into a coma and was in hospital for 3 days after taking   Procaine Swelling and Other (See Comments)   Amiodarone Nausea And Vomiting and Other (See Comments)    Level of Care/Admitting Diagnosis ED Disposition     ED Disposition  Admit   Condition  --   Comment  Hospital [facility] [[id number]]  Level of Care: ICU [6]  May admit patient to [facility] or [facility] if equivalent level of care is available:: No  Covid Evaluation: Asymptomatic - no recent exposure (last 10 days) testing not required  Diagnosis: VT (ventricular tachycardia) [facility]) [[id number]]  Admitting Physician: Regan Lemming [phone]  Attending Physician: Regan Lemming [phone]  Bed request comments: 2h  Certification:: I certify this patient will need inpatient services for at least 2 midnights  Expected Medical Readiness: 03/23/2023          B Medical/Surgery History Past Medical History:  Diagnosis Date   Arthritis    "minor everywhere" (04/22/2015)   Asthma    "a touch"   Barrett esophagus    CAD (coronary artery disease)    a. s/p 3 vvessel CABG with LIMA to LAD, SVG to diagonal 1, SVG to RCA 12/09.   Cardiac resynchronization therapy defibrillator (CRT-D) in place    Chronic anticoagulation 01/23/2013    Chronic combined systolic and diastolic CHF (congestive heart failure) (HCC)    dry weight 197-200lbs.   CKD (chronic kidney disease) stage 3, GFR 30-59 ml/min (HCC) 08/23/2014   Complication of anesthesia    DCM (dilated cardiomyopathy) (HCC)    initially concerned for TTP amyloidosis but Tc-PYP scan 06/2017 was not consistent with amyloid and SPEP/UPEP with no M spike. EF 20-25% by echo 2019   Diverticulitis 08/2019   Dyslipidemia 01/23/2013   Essential hypertension 01/23/2013   GERD (gastroesophageal reflux disease)    GI bleed 05/08/2017   H/O: rheumatic fever    Hepatitis 1957   "don't know what kind:   History of hiatal hernia    History of PFTs    a. Amiodarone started 5/16 >> PFTs w/ DLCO 6/16:  FEV1 72% predicted, FEV1/FVC 66%, DLCO 66% >> minimal reversible obstructive airways disease with mild diffusion defect (suggestive of emphysema but absence of hyperinflation inconsistent with dx)   Hyperkalemia 08/23/2014   Hyperthyroidism 05/26/2017   Leg weakness 01/28/2017   Multiple thyroid nodules 08/13/2017   Orthostatic hypotension    Persistent atrial fibrillation (HCC) 01/23/2013   Pleural effusion on right 05/26/2017   Pneumonia 08/2014   "dr thought I may have had a touch"   Pre-diabetes    Protein-calorie malnutrition, severe 05/27/2017   S/P AVR (aortic valve replacement) 2009   a. severe AS s/p AVR with pericardial tissue valve 2009.   Shingles 08/23/2014   VT (  ventricular tachycardia) (HCC) 05/06/2017   Past Surgical History:  Procedure Laterality Date   AORTIC VALVE REPLACEMENT  with 23-mm Magna Ease pericardial valve, model number   with 23-mm Magna Ease pericardial valve, model [id number], serial number [id number]    APPENDECTOMY  1940s   BI-VENTRICULAR IMPLANTABLE CARDIOVERTER DEFIBRILLATOR  (CRT-D)  04/22/2015   BIV ICD GENERATOR CHANGEOUT N/A 09/18/2022   Procedure: BIV ICD GENERATOR CHANGEOUT;  Surgeon: Regan Lemming, MD;  Location: [facility] INVASIVE CV LAB;  Service:  Cardiovascular;  Laterality: N/A;   CARDIAC CATHETERIZATION N/A 11/04/2014   Procedure: Left Heart Cath and Cors/Grafts Angiography;  Surgeon: Marykay Lex, MD;  Location: [facility] INVASIVE CV LAB;  Service: Cardiovascular;  Laterality: N/A;   CARDIAC VALVE REPLACEMENT     CARDIOVERSION N/A 01/27/2013   Procedure: CARDIOVERSION;  Surgeon: Quintella Reichert, MD;  Location: [facility] ENDOSCOPY;  Service: Cardiovascular;  Laterality: N/A;   CARDIOVERSION N/A 08/25/2014   Procedure: CARDIOVERSION;  Surgeon: Thurmon Fair, MD;  Location: [facility] ENDOSCOPY;  Service: Cardiovascular;  Laterality: N/A;   CARDIOVERSION N/A 05/28/2018   Procedure: CARDIOVERSION;  Surgeon: Chrystie Nose, MD;  Location: [facility] ENDOSCOPY;  Service: Cardiovascular;  Laterality: N/A;   CARDIOVERSION N/A 10/28/2019   Procedure: CARDIOVERSION;  Surgeon: Quintella Reichert, MD;  Location: [facility] ENDOSCOPY;  Service: Cardiovascular;  Laterality: N/A;   CARDIOVERSION N/A 09/01/2020   Procedure: CARDIOVERSION;  Surgeon: Lewayne Bunting, MD;  Location: [facility] ENDOSCOPY;  Service: Cardiovascular;  Laterality: N/A;   CARDIOVERSION N/A 04/04/2022   Procedure: CARDIOVERSION;  Surgeon: Chrystie Nose, MD;  Location: [facility] ENDOSCOPY;  Service: Cardiovascular;  Laterality: N/A;   CATARACT EXTRACTION W/ INTRAOCULAR LENS  IMPLANT, BILATERAL Bilateral    CHEST TUBE INSERTION Right 07/26/2017   Procedure: INSERTION PLEURAL DRAINAGE CATHETER - RIGHT;  Surgeon: Kerin Perna, MD;  Location: [facility] OR;  Service: Thoracic;  Laterality: Right;   CORONARY ARTERY BYPASS GRAFT  03/10/2008   x 3 Dr. Dorris Fetch   EP IMPLANTABLE DEVICE N/A 04/22/2015   Procedure: BiV ICD Insertion CRT-D;  Surgeon: Will Jorja Loa, MD;  Location: [facility] INVASIVE CV LAB;  Service: Cardiovascular;  Laterality: N/A;   ESOPHAGOGASTRODUODENOSCOPY (EGD) WITH ESOPHAGEAL DILATION  X1   ESOPHAGOGASTRODUODENOSCOPY (EGD) WITH PROPOFOL N/A 05/09/2017   Procedure: ESOPHAGOGASTRODUODENOSCOPY (EGD) WITH PROPOFOL;   Surgeon: Jeani Hawking, MD;  Location: [facility] ENDOSCOPY;  Service: Endoscopy;  Laterality: N/A;   IR THORACENTESIS ASP PLEURAL SPACE W/IMG GUIDE  05/27/2017   IR THORACENTESIS ASP PLEURAL SPACE W/IMG GUIDE  06/17/2017   IR THORACENTESIS ASP PLEURAL SPACE W/IMG GUIDE  06/18/2017   IR THORACENTESIS ASP PLEURAL SPACE W/IMG GUIDE  07/11/2017   REMOVAL OF PLEURAL DRAINAGE CATHETER Right 09/13/2017   Procedure: REMOVAL OF PLEURAL DRAINAGE CATHETER;  Surgeon: Kerin Perna, MD;  Location: [facility] OR;  Service: Thoracic;  Laterality: Right;   RIGHT HEART CATH AND CORONARY/GRAFT ANGIOGRAPHY N/A 05/31/2017   Procedure: RIGHT HEART CATH AND CORONARY/GRAFT ANGIOGRAPHY;  Surgeon: Dolores Patty, MD;  Location: [facility] INVASIVE CV LAB;  Service: Cardiovascular;  Laterality: N/A;   TONSILLECTOMY       A IV Location/Drains/Wounds Patient Lines/Drains/Airways Status     Active Line/Drains/Airways     Name Placement date Placement time Site Days   Peripheral IV 03/19/23 20 G Right Antecubital 03/19/23  1409  Antecubital  less than 1            Intake/Output Last 24 hours No intake or output data in the 24 hours ending 03/19/23 1448  Labs/Imaging Results for orders placed or performed during the hospital encounter of 03/19/23 (from the past 48 hour(s))  CBC     Status: None   Collection Time: 03/19/23 10:52 AM  Result Value Ref Range   WBC 8.4 4.0 - 10.5 K/uL   RBC 4.53 4.22 - 5.81 MIL/uL   Hemoglobin 13.4 13.0 - 17.0 g/dL   HCT 47.8 29.5 - 62.1 %   MCV 93.6 80.0 - 100.0 fL   MCH 29.6 26.0 - 34.0 pg   MCHC 31.6 30.0 - 36.0 g/dL   RDW 30.8 65.7 - 84.6 %   Platelets 289 150 - 400 K/uL   nRBC 0.0 0.0 - 0.2 %    Comment: Performed at [facility] Lab, [address] [place], [place] [address]  Basic metabolic panel     Status: Abnormal   Collection Time: 03/19/23 10:52 AM  Result Value Ref Range   Sodium 138 135 - 145 mmol/L   Potassium 4.5 3.5 - 5.1 mmol/L   Chloride 105 98 - 111 mmol/L   CO2 25  22 - 32 mmol/L   Glucose, Bld 130 (H) 70 - 99 mg/dL    Comment: Glucose reference range applies only to samples taken after fasting for at least 8 hours.   BUN 28 (H) 8 - 23 mg/dL   Creatinine, Ser 2.84 (H) 0.61 - 1.24 mg/dL   Calcium 9.2 8.9 - 13.2 mg/dL   GFR, Estimated 29 (L) >60 mL/min    Comment: (NOTE) Calculated using the CKD-EPI Creatinine Equation (2021)    Anion gap 8 5 - 15    Comment: Performed at [facility] Lab, [address] [place], [place] [address]  Troponin I (High Sensitivity)     Status: Abnormal   Collection Time: 03/19/23 10:52 AM  Result Value Ref Range   Troponin I (High Sensitivity) 21 (H) <18 ng/L    Comment: (NOTE) Elevated high sensitivity troponin I (hsTnI) values and significant  changes across serial measurements may suggest ACS but many other  chronic and acute conditions are known to elevate hsTnI results.  Refer to the "Links" section for chest pain algorithms and additional  guidance. Performed at [facility] Lab, [address] [place], [place] [address]    DG Chest 2 View  Result Date: 03/19/2023 CLINICAL DATA:  Chest pain EXAM: CHEST - 2 VIEW COMPARISON:  03/15/2023 FINDINGS: Stable right midlung linear scarring or atelectasis. No new infiltrate or overt edema. Stable left subclavian AICD. No pneumothorax. Heart size and mediastinal contours are within normal limits. Post AVR. CABG markers. No effusion. Sternotomy wires. Vertebral endplate spurring at multiple levels in the lower thoracic spine. IMPRESSION: No acute cardiopulmonary disease. Electronically Signed   By: Corlis Leak M.D.   On: 03/19/2023 13:15    Pending Labs Unresulted Labs (From admission, onward)     Start     Ordered   03/19/23 1428  Magnesium  Once,   STAT        03/19/23 1427            Vitals/Pain Today's Vitals   03/19/23 1014 03/19/23 1043 03/19/23 1401  BP: (!) 147/92  (!) 152/98  Pulse: 88  80  Resp: 18  13  Temp:   97.8 F (36.6 C)  TempSrc:    Oral  SpO2: 100%  100%  Weight:  189 lb (85.7 kg)   Height:  6' (1.829 m)   PainSc:  0-No pain 0-No pain  Isolation Precautions No active isolations  Medications Medications - No data to display  Mobility walks with device     Focused Assessments     R Recommendations: See Admitting Provider Note  Report given to:   Additional Notes:

## 2023-03-19 NOTE — ED Provider Notes (Signed)
[place] EMERGENCY DEPARTMENT AT [facility] Provider Note  CSN: [id number] Arrival date & time: 03/19/23 1007  Chief Complaint(s) Pacemaker Problem  HPI [name] is a 89 y.o. male history of coronary artery disease, CKD, dilated cardiomyopathy, ICD in place presenting from cardiology clinic for VT.  He had a syncope episode around 2 weeks ago where he lost consciousness, and then today, on the way to his cardiology appointment he had an episode of tunnel vision, lightheadedness and dizziness which resolved.  Did not lose consciousness today.  He denies any shock.  Denies any recent chest pain, abdominal pain, shortness of breath, leg swelling, fevers or chills, headaches, or any other new symptoms.  When he was examined in the cardiology clinic today, they noticed that his device had ATP paced him out of VT multiple times and wanted him to come to the hospital for evaluation.   Past Medical History Past Medical History:  Diagnosis Date   Arthritis    "minor everywhere" (04/22/2015)   Asthma    "a touch"   Barrett esophagus    CAD (coronary artery disease)    a. s/p 3 vvessel CABG with LIMA to LAD, SVG to diagonal 1, SVG to RCA 12/09.   Cardiac resynchronization therapy defibrillator (CRT-D) in place    Chronic anticoagulation 01/23/2013   Chronic combined systolic and diastolic CHF (congestive heart failure) (HCC)    dry weight 197-200lbs.   CKD (chronic kidney disease) stage 3, GFR 30-59 ml/min (HCC) 08/23/2014   Complication of anesthesia    DCM (dilated cardiomyopathy) (HCC)    initially concerned for TTP amyloidosis but Tc-PYP scan 06/2017 was not consistent with amyloid and SPEP/UPEP with no M spike. EF 20-25% by echo 2019   Diverticulitis 08/2019   Dyslipidemia 01/23/2013   Essential hypertension 01/23/2013   GERD (gastroesophageal reflux disease)    GI bleed 05/08/2017   H/O: rheumatic fever    Hepatitis 1957   "don't know what kind:   History of hiatal  hernia    History of PFTs    a. Amiodarone started 5/16 >> PFTs w/ DLCO 6/16:  FEV1 72% predicted, FEV1/FVC 66%, DLCO 66% >> minimal reversible obstructive airways disease with mild diffusion defect (suggestive of emphysema but absence of hyperinflation inconsistent with dx)   Hyperkalemia 08/23/2014   Hyperthyroidism 05/26/2017   Leg weakness 01/28/2017   Multiple thyroid nodules 08/13/2017   Orthostatic hypotension    Persistent atrial fibrillation (HCC) 01/23/2013   Pleural effusion on right 05/26/2017   Pneumonia 08/2014   "dr thought I may have had a touch"   Pre-diabetes    Protein-calorie malnutrition, severe 05/27/2017   S/P AVR (aortic valve replacement) 2009   a. severe AS s/p AVR with pericardial tissue valve 2009.   Shingles 08/23/2014   VT (ventricular tachycardia) (HCC) 05/06/2017   Patient Active Problem List   Diagnosis Date Noted   Hypercoagulable state due to persistent atrial fibrillation (HCC) 07/19/2022   Orthostatic hypotension 09/11/2019   Ileus (HCC) 09/04/2019   Hyponatremia 09/03/2019   Metabolic acidosis 09/03/2019   Diverticulitis 09/02/2019   Acute urinary retention 09/02/2019   Lower extremity weakness 11/29/2018   Multiple thyroid nodules 08/13/2017   DCM (dilated cardiomyopathy) (HCC) 06/16/2017   History of aortic valve replacement with bioprosthetic valve    Cardiac resynchronization therapy defibrillator (CRT-D) in place    Protein-calorie malnutrition, severe 05/27/2017   Hyperthyroidism 05/26/2017   Cough 05/26/2017   Pleural effusion on right 05/26/2017  Malnutrition of moderate degree 05/09/2017   GI bleed 05/08/2017   VT (ventricular tachycardia) (HCC) 05/06/2017   Dizziness 04/23/2017   Weight loss 04/23/2017   Leg weakness 01/28/2017   Chronic combined systolic (congestive) and diastolic (congestive) heart failure (HCC)    Shingles 08/23/2014   Stage 4 chronic kidney disease (HCC) 08/23/2014   Hyperkalemia 08/23/2014   Essential  hypertension 01/23/2013   Dyslipidemia 01/23/2013   Coronary artery disease 01/23/2013   Persistent atrial fibrillation (HCC) 01/23/2013   Chronic anticoagulation 01/23/2013   Home Medication(s) Prior to Admission medications   Medication Sig Start Date End Date Taking? Authorizing Provider  acetaminophen (TYLENOL) 500 MG tablet Take 1,000 mg by mouth every 6 (six) hours as needed for moderate pain or headache.    [provider]  albuterol (VENTOLIN HFA) 108 (90 Base) MCG/ACT inhaler Inhale 1-2 puffs into the lungs every 6 (six) hours as needed for wheezing or shortness of breath. 05/09/20   [provider]  amoxicillin (AMOXIL) 500 MG capsule Take 4 capsules (2,000mg ) by mouth 30-60 minutes prior to dental work/cleanings. 10/02/22   Camnitz, Andree Coss, MD  atorvastatin (LIPITOR) 40 MG tablet Take 1 tablet (40 mg total) by mouth daily. 10/08/22   Quintella Reichert, MD  carvedilol (COREG) 25 MG tablet TAKE 1 TABLET TWICE DAILY WITH A MEAL (DOSE INCREASE) PLEASE KEEP NOVEMBER APPOINTMENT FOR FUTURE REFILLS 10/08/22   Quintella Reichert, MD  ELIQUIS 2.5 MG TABS tablet TAKE 1 TABLET TWICE DAILY 07/02/22   Quintella Reichert, MD  famotidine (PEPCID) 40 MG tablet Take 40 mg by mouth every evening. 07/01/18   [provider]  furosemide (LASIX) 40 MG tablet TAKE 1 TABLET EVERY DAY AS NEEDED FOR EDEMA Patient taking differently: Take 20 mg by mouth daily. 07/02/22   Quintella Reichert, MD  hydrALAZINE (APRESOLINE) 25 MG tablet TAKE 1/2 TABLET BY MOUTH IN THE MORNING AND AT BEDTIME 04/25/21   Quintella Reichert, MD  isosorbide mononitrate (IMDUR) 30 MG 24 hr tablet TAKE 1 TABLET EVERY DAY. PLEASE KEEP NOVEMBER APPOINTMENT FOR FUTURE REFILLS 10/08/22   Quintella Reichert, MD  mexiletine (MEXITIL) 150 MG capsule TAKE 2 CAPSULES BY MOUTH EVERY 12 HOURS. 03/13/23   Quintella Reichert, MD  Multiple Vitamin (MULTIVITAMIN WITH MINERALS) TABS tablet Take 1 tablet by mouth in the morning.    [provider]   pantoprazole (PROTONIX) 40 MG tablet Take 40 mg by mouth daily before breakfast.    [provider]  Polyethyl Glycol-Propyl Glycol (SYSTANE OP) Place 1 drop into both eyes 3 (three) times daily as needed (dry/irritated eyes.).    [provider]  sodium bicarbonate 650 MG tablet Take 650 mg by mouth in the morning.    [provider]  sotalol (BETAPACE) 120 MG tablet TAKE 1 TABLET EVERY DAY 10/08/22   Camnitz, Andree Coss, MD  VASCEPA 1 g capsule TAKE 2 CAPSULES BY MOUTH 2 TIMES DAILY. 02/15/23   Quintella Reichert, MD  Past Surgical History Past Surgical History:  Procedure Laterality Date   AORTIC VALVE REPLACEMENT  with 23-mm Magna Ease pericardial valve, model number   with 23-mm Magna Ease pericardial valve, model [id number], serial number [id number]    APPENDECTOMY  1940s   BI-VENTRICULAR IMPLANTABLE CARDIOVERTER DEFIBRILLATOR  (CRT-D)  04/22/2015   BIV ICD GENERATOR CHANGEOUT N/A 09/18/2022   Procedure: BIV ICD GENERATOR CHANGEOUT;  Surgeon: Regan Lemming, MD;  Location: [facility] INVASIVE CV LAB;  Service: Cardiovascular;  Laterality: N/A;   CARDIAC CATHETERIZATION N/A 11/04/2014   Procedure: Left Heart Cath and Cors/Grafts Angiography;  Surgeon: Marykay Lex, MD;  Location: [facility] INVASIVE CV LAB;  Service: Cardiovascular;  Laterality: N/A;   CARDIAC VALVE REPLACEMENT     CARDIOVERSION N/A 01/27/2013   Procedure: CARDIOVERSION;  Surgeon: Quintella Reichert, MD;  Location: [facility] ENDOSCOPY;  Service: Cardiovascular;  Laterality: N/A;   CARDIOVERSION N/A 08/25/2014   Procedure: CARDIOVERSION;  Surgeon: Thurmon Fair, MD;  Location: [facility] ENDOSCOPY;  Service: Cardiovascular;  Laterality: N/A;   CARDIOVERSION N/A 05/28/2018   Procedure: CARDIOVERSION;  Surgeon: Chrystie Nose, MD;  Location: [facility] ENDOSCOPY;  Service: Cardiovascular;  Laterality: N/A;    CARDIOVERSION N/A 10/28/2019   Procedure: CARDIOVERSION;  Surgeon: Quintella Reichert, MD;  Location: [facility] ENDOSCOPY;  Service: Cardiovascular;  Laterality: N/A;   CARDIOVERSION N/A 09/01/2020   Procedure: CARDIOVERSION;  Surgeon: Lewayne Bunting, MD;  Location: [facility] ENDOSCOPY;  Service: Cardiovascular;  Laterality: N/A;   CARDIOVERSION N/A 04/04/2022   Procedure: CARDIOVERSION;  Surgeon: Chrystie Nose, MD;  Location: [facility] ENDOSCOPY;  Service: Cardiovascular;  Laterality: N/A;   CATARACT EXTRACTION W/ INTRAOCULAR LENS  IMPLANT, BILATERAL Bilateral    CHEST TUBE INSERTION Right 07/26/2017   Procedure: INSERTION PLEURAL DRAINAGE CATHETER - RIGHT;  Surgeon: Kerin Perna, MD;  Location: [facility] OR;  Service: Thoracic;  Laterality: Right;   CORONARY ARTERY BYPASS GRAFT  03/10/2008   x 3 Dr. Dorris Fetch   EP IMPLANTABLE DEVICE N/A 04/22/2015   Procedure: BiV ICD Insertion CRT-D;  Surgeon: Will Jorja Loa, MD;  Location: [facility] INVASIVE CV LAB;  Service: Cardiovascular;  Laterality: N/A;   ESOPHAGOGASTRODUODENOSCOPY (EGD) WITH ESOPHAGEAL DILATION  X1   ESOPHAGOGASTRODUODENOSCOPY (EGD) WITH PROPOFOL N/A 05/09/2017   Procedure: ESOPHAGOGASTRODUODENOSCOPY (EGD) WITH PROPOFOL;  Surgeon: Jeani Hawking, MD;  Location: [facility] ENDOSCOPY;  Service: Endoscopy;  Laterality: N/A;   IR THORACENTESIS ASP PLEURAL SPACE W/IMG GUIDE  05/27/2017   IR THORACENTESIS ASP PLEURAL SPACE W/IMG GUIDE  06/17/2017   IR THORACENTESIS ASP PLEURAL SPACE W/IMG GUIDE  06/18/2017   IR THORACENTESIS ASP PLEURAL SPACE W/IMG GUIDE  07/11/2017   REMOVAL OF PLEURAL DRAINAGE CATHETER Right 09/13/2017   Procedure: REMOVAL OF PLEURAL DRAINAGE CATHETER;  Surgeon: Kerin Perna, MD;  Location: [facility] OR;  Service: Thoracic;  Laterality: Right;   RIGHT HEART CATH AND CORONARY/GRAFT ANGIOGRAPHY N/A 05/31/2017   Procedure: RIGHT HEART CATH AND CORONARY/GRAFT ANGIOGRAPHY;  Surgeon: Dolores Patty, MD;  Location: [facility] INVASIVE CV LAB;  Service: Cardiovascular;   Laterality: N/A;   TONSILLECTOMY     Family History Family History  Problem Relation Age of Onset   Alzheimer's disease Mother    COPD Daughter    Alzheimer's disease Other    COPD Sister    Depression Sister    Heart disease Sister    Hyperlipidemia Sister    Hypertension Sister    Esophageal cancer Sister        + smoker    Social History Social  History   Tobacco Use   Smoking status: Never   Smokeless tobacco: Never   Tobacco comments:    Never smoke 04/17/22  Vaping Use   Vaping status: Never Used  Substance Use Topics   Alcohol use: Not Currently    Comment: 04/22/2015 "nothing since the 1980s; never had a problem w/it"   Drug use: No   Allergies Ranolazine, Procaine, and Amiodarone  Review of Systems Review of Systems  All other systems reviewed and are negative.   Physical Exam Vital Signs  I have reviewed the triage vital signs BP (!) 152/98   Pulse 80   Temp 97.8 F (36.6 C) (Oral)   Resp 13   Ht 6' (1.829 m)   Wt 85.7 kg   SpO2 100%   BMI 25.63 kg/m  Physical Exam Vitals and nursing note reviewed.  Constitutional:      General: He is not in acute distress.    Appearance: Normal appearance.  HENT:     Mouth/Throat:     Mouth: Mucous membranes are moist.  Eyes:     Conjunctiva/sclera: Conjunctivae normal.  Cardiovascular:     Rate and Rhythm: Normal rate and regular rhythm.  Pulmonary:     Effort: Pulmonary effort is normal. No respiratory distress.     Breath sounds: Normal breath sounds.  Abdominal:     General: Abdomen is flat.     Palpations: Abdomen is soft.     Tenderness: There is no abdominal tenderness.  Musculoskeletal:     Right lower leg: No edema.     Left lower leg: No edema.  Skin:    General: Skin is warm and dry.     Capillary Refill: Capillary refill takes less than 2 seconds.  Neurological:     Mental Status: He is alert and oriented to person, place, and time. Mental status is at baseline.  Psychiatric:         Mood and Affect: Mood normal.        Behavior: Behavior normal.     ED Results and Treatments Labs (all labs ordered are listed, but only abnormal results are displayed) Labs Reviewed  BASIC METABOLIC PANEL - Abnormal; Notable for the following components:      Result Value   Glucose, Bld 130 (*)    BUN 28 (*)    Creatinine, Ser 2.15 (*)    GFR, Estimated 29 (*)    All other components within normal limits  TROPONIN I (HIGH SENSITIVITY) - Abnormal; Notable for the following components:   Troponin I (High Sensitivity) 21 (*)    All other components within normal limits  CBC  MAGNESIUM  TROPONIN I (HIGH SENSITIVITY)                                                                                                                          Radiology DG Chest 2 View  Result Date: 03/19/2023 CLINICAL DATA:  Chest pain EXAM: CHEST - 2  VIEW COMPARISON:  03/15/2023 FINDINGS: Stable right midlung linear scarring or atelectasis. No new infiltrate or overt edema. Stable left subclavian AICD. No pneumothorax. Heart size and mediastinal contours are within normal limits. Post AVR. CABG markers. No effusion. Sternotomy wires. Vertebral endplate spurring at multiple levels in the lower thoracic spine. IMPRESSION: No acute cardiopulmonary disease. Electronically Signed   By: Corlis Leak M.D.   On: 03/19/2023 13:15    Pertinent labs & imaging results that were available during my care of the patient were reviewed by me and considered in my medical decision making (see MDM for details).  Medications Ordered in ED Medications - No data to display                                                                                                                                   Procedures Procedures  (including critical care time)  Medical Decision Making / ED Course   MDM:  89 year old male presenting to the emergency department with episodes of VT.  Patient is overall quite well-appearing, in a  paced rhythm on the monitor.  His pacemaker interrogation does reveal episodes of VT today.  He is seen by electrophysiology who recommend admission.  He has been admitted to the cardiology service for further management of his recurrent VT.  He currently is hemodynamically stable and very well-appearing.  His electrolytes so far reassuring.  Added a magnesium level as well.      Additional history obtained: -External records from outside source obtained and reviewed including: Chart review including previous notes, labs, imaging, consultation notes including cardiology notes    Lab Tests: -I ordered, reviewed, and interpreted labs.   The pertinent results include:   Labs Reviewed  BASIC METABOLIC PANEL - Abnormal; Notable for the following components:      Result Value   Glucose, Bld 130 (*)    BUN 28 (*)    Creatinine, Ser 2.15 (*)    GFR, Estimated 29 (*)    All other components within normal limits  TROPONIN I (HIGH SENSITIVITY) - Abnormal; Notable for the following components:   Troponin I (High Sensitivity) 21 (*)    All other components within normal limits  CBC  MAGNESIUM  TROPONIN I (HIGH SENSITIVITY)    Notable for CKD, mild troponin elevation  EKG  Paced     Imaging Studies ordered: I ordered imaging studies including CXR On my interpretation imaging demonstrates no acute process I independently visualized and interpreted imaging. I agree with the radiologist interpretation   Medicines ordered and prescription drug management: No orders of the defined types were placed in this encounter.   -I have reviewed the patients home medicines and have made adjustments as needed   Consultations Obtained: I requested consultation with the cardiologist,  and discussed lab and imaging findings as well as pertinent plan - they recommend: admission   Cardiac Monitoring: The patient was maintained on  a cardiac monitor.  I personally viewed and interpreted the cardiac  monitored which showed an underlying rhythm of: NSR   Reevaluation: After the interventions noted above, I reevaluated the patient and found that their symptoms have improved  Co morbidities that complicate the patient evaluation  Past Medical History:  Diagnosis Date   Arthritis    "minor everywhere" (04/22/2015)   Asthma    "a touch"   Barrett esophagus    CAD (coronary artery disease)    a. s/p 3 vvessel CABG with LIMA to LAD, SVG to diagonal 1, SVG to RCA 12/09.   Cardiac resynchronization therapy defibrillator (CRT-D) in place    Chronic anticoagulation 01/23/2013   Chronic combined systolic and diastolic CHF (congestive heart failure) (HCC)    dry weight 197-200lbs.   CKD (chronic kidney disease) stage 3, GFR 30-59 ml/min (HCC) 08/23/2014   Complication of anesthesia    DCM (dilated cardiomyopathy) (HCC)    initially concerned for TTP amyloidosis but Tc-PYP scan 06/2017 was not consistent with amyloid and SPEP/UPEP with no M spike. EF 20-25% by echo 2019   Diverticulitis 08/2019   Dyslipidemia 01/23/2013   Essential hypertension 01/23/2013   GERD (gastroesophageal reflux disease)    GI bleed 05/08/2017   H/O: rheumatic fever    Hepatitis 1957   "don't know what kind:   History of hiatal hernia    History of PFTs    a. Amiodarone started 5/16 >> PFTs w/ DLCO 6/16:  FEV1 72% predicted, FEV1/FVC 66%, DLCO 66% >> minimal reversible obstructive airways disease with mild diffusion defect (suggestive of emphysema but absence of hyperinflation inconsistent with dx)   Hyperkalemia 08/23/2014   Hyperthyroidism 05/26/2017   Leg weakness 01/28/2017   Multiple thyroid nodules 08/13/2017   Orthostatic hypotension    Persistent atrial fibrillation (HCC) 01/23/2013   Pleural effusion on right 05/26/2017   Pneumonia 08/2014   "dr thought I may have had a touch"   Pre-diabetes    Protein-calorie malnutrition, severe 05/27/2017   S/P AVR (aortic valve replacement) 2009   a. severe AS s/p AVR  with pericardial tissue valve 2009.   Shingles 08/23/2014   VT (ventricular tachycardia) (HCC) 05/06/2017      Dispostion: Disposition decision including need for hospitalization was considered, and patient admitted to the hospital.    Final Clinical Impression(s) / ED Diagnoses Final diagnoses:  Ventricular tachycardia (HCC)     This chart was dictated using voice recognition software.  Despite best efforts to proofread,  errors can occur which can change the documentation meaning.    Lonell Grandchild, MD 03/19/23 1500

## 2023-03-19 NOTE — ED Triage Notes (Addendum)
Pt. Stated, I had a Dr.'s appt this morning and they sent me here cause my pacer went off 8 times. I didn't have any symptoms I felt it twice.Ive had it for several years

## 2023-03-19 NOTE — ED Provider Triage Note (Signed)
Emergency Medicine Provider Triage Evaluation Note  [name] , a 89 y.o. male  was evaluated in triage.  Pt complains of pacemaker firing.  Reports several episodes of near syncope with feeling his pacemaker firing.  Not having chest pain, shortness of breath or any symptoms currently.  Review of Systems  Positive: Near syncope  Negative: Chest pain, SOB   Physical Exam  BP (!) 147/92 (BP Location: Left Arm)   Pulse 88   Resp 18   Ht 6' (1.829 m)   Wt 85.7 kg   SpO2 100%   BMI 25.63 kg/m  Gen:   Awake, no distress   Resp:  Normal effort  MSK:   Moves extremities without difficulty  Other:  Equal radial pulses   Medical Decision Making  Medically screening exam initiated at 10:45 AM.  Appropriate orders placed.  [name] was informed that the remainder of the evaluation will be completed by another provider, this initial triage assessment does not replace that evaluation, and the importance of remaining in the ED until their evaluation is complete.  89 year old male with history presenting with complaint that his pacemaker was firing.  Well-appearing on exam.  Reassuring vitals.  Has complex cardiac history.  Will get EKG, cardiac labs and interrogate pacemaker.   [name] Spikes, DO 03/19/23 1046

## 2023-03-19 NOTE — Patient Instructions (Signed)
YOU HAVE RECOMMENDED TO GO TO EMERGENCY DEPARTMENT

## 2023-03-19 NOTE — Progress Notes (Addendum)
Paged by nurse report patient had sustained VT 140s for 2 minutes while urinating. He was asleep, awaken to voice, denied any chest pain or palpitation or dizziness, he was not aware of the episode, denied any shock, VS stable. Telemetry reviewed, baseline wide QRS complex, ventricular tachycardia with rate of 140s started around 1732, period of narrow complex tachycardia 140-150s during 1734, followed by brief VT and then intermittently paced rhythm versus ATP at 1735. Called EP team, reviewed above concerns, recommend to hold off amiodarone ideally for 24 hours after sotalol wash out, if he continue to have recurrent sustained VT episodes tonight, OK to start amiodarone gtt tonight, will sign out to overnight fellow, advised nurse to call back with concerns.  Will ask on call cardiologist tonight to review telemetry strips as well.    Attending note Telemetry reviewed. Patient with 1 min 30 seconds of monomorphic VT, asymptomatic. From tele looked to terminate from ATP. Chart reviewed, EP's plan was amio tomorrow but would start tonight if recurrent sustained VT. We will start amio drip with 150mg  bolus followed by continuous infusion.    Dina Rich MD

## 2023-03-19 NOTE — ED Notes (Addendum)
Cardiology at bedside, lab added on magnesium level

## 2023-03-19 NOTE — ED Notes (Signed)
Got patient undressed into a gown on the monitor patient resting with call bell in reach

## 2023-03-19 NOTE — H&P (Addendum)
ELECTROPHYSIOLOGY History and Physical NOTE    Patient ID: [name] MRN: [id number], DOB/AGE: 1932-12-06 89 y.o.  Admit date: 03/19/2023 Date of Consult: 03/19/2023  Primary Physician: Joycelyn Rua, MD Primary Cardiologist: Armanda Magic, MD  Electrophysiologist: Dr. Elberta Fortis   Referring Provider: Dr. Suezanne Jacquet  Patient Profile: [name] is a 89 y.o. male with a history of HFrEF, CAD, ICM s/p CRT-D, AS s/p bioprosthetic AVR, perm AFib, CKD stage 4, hyperthyroid, HTN, HLD who is being seen today for the evaluation of ICD shock at the request of Dr. Suezanne Jacquet.  HPI:  [name] is a 89 y.o. male with PMH as above.  He was seen in ER 12/7 with complaints of weakness and dizziness thought to be orthostatic in nature. His hydral was held. In ER, device interrogation showed 3 episodes of VT treated with ATP. His symptoms were attributed to orthostasis and he was discharged from ER.   He presented to EP clinic today for ER follow-up. On the way to the visit, he developed LD, dizziness, with vision going black. No LOC. Device interrogation revealed several episodes of VT requiring ATP this morning. He was referred to ER for further workup and management.   Last dose of sotalol was this morning, no missed doses of any medications.  He denies chest pain, palpitations, dyspnea, PND, orthopnea, nausea, vomiting, dizziness, syncope, edema, weight gain, or early satiety.   Labs Potassium4.5 (12/10 1052)   Creatinine, ser  2.15* (12/10 1052) PLT  289 (12/10 1052) HGB  13.4 (12/10 1052) WBC 8.4 (12/10 1052) Troponin I (High Sensitivity)21* (12/10 1052).    Past Medical History:  Diagnosis Date   Arthritis    "minor everywhere" (04/22/2015)   Asthma    "a touch"   Barrett esophagus    CAD (coronary artery disease)    a. s/p 3 vvessel CABG with LIMA to LAD, SVG to diagonal 1, SVG to RCA 12/09.   Cardiac resynchronization therapy defibrillator (CRT-D) in place     Chronic anticoagulation 01/23/2013   Chronic combined systolic and diastolic CHF (congestive heart failure) (HCC)    dry weight 197-200lbs.   CKD (chronic kidney disease) stage 3, GFR 30-59 ml/min (HCC) 08/23/2014   Complication of anesthesia    DCM (dilated cardiomyopathy) (HCC)    initially concerned for TTP amyloidosis but Tc-PYP scan 06/2017 was not consistent with amyloid and SPEP/UPEP with no M spike. EF 20-25% by echo 2019   Diverticulitis 08/2019   Dyslipidemia 01/23/2013   Essential hypertension 01/23/2013   GERD (gastroesophageal reflux disease)    GI bleed 05/08/2017   H/O: rheumatic fever    Hepatitis 1957   "don't know what kind:   History of hiatal hernia    History of PFTs    a. Amiodarone started 5/16 >> PFTs w/ DLCO 6/16:  FEV1 72% predicted, FEV1/FVC 66%, DLCO 66% >> minimal reversible obstructive airways disease with mild diffusion defect (suggestive of emphysema but absence of hyperinflation inconsistent with dx)   Hyperkalemia 08/23/2014   Hyperthyroidism 05/26/2017   Leg weakness 01/28/2017   Multiple thyroid nodules 08/13/2017   Orthostatic hypotension    Persistent atrial fibrillation (HCC) 01/23/2013   Pleural effusion on right 05/26/2017   Pneumonia 08/2014   "dr thought I may have had a touch"   Pre-diabetes    Protein-calorie malnutrition, severe 05/27/2017   S/P AVR (aortic valve replacement) 2009   a. severe AS s/p AVR with pericardial tissue valve 2009.   Shingles 08/23/2014  VT (ventricular tachycardia) (HCC) 05/06/2017     Surgical History:  Past Surgical History:  Procedure Laterality Date   AORTIC VALVE REPLACEMENT  with 23-mm Magna Ease pericardial valve, model number   with 23-mm Magna Ease pericardial valve, model [id number], serial number [id number]    APPENDECTOMY  1940s   BI-VENTRICULAR IMPLANTABLE CARDIOVERTER DEFIBRILLATOR  (CRT-D)  04/22/2015   BIV ICD GENERATOR CHANGEOUT N/A 09/18/2022   Procedure: BIV ICD GENERATOR CHANGEOUT;  Surgeon:  Regan Lemming, MD;  Location: [facility] INVASIVE CV LAB;  Service: Cardiovascular;  Laterality: N/A;   CARDIAC CATHETERIZATION N/A 11/04/2014   Procedure: Left Heart Cath and Cors/Grafts Angiography;  Surgeon: Marykay Lex, MD;  Location: [facility] INVASIVE CV LAB;  Service: Cardiovascular;  Laterality: N/A;   CARDIAC VALVE REPLACEMENT     CARDIOVERSION N/A 01/27/2013   Procedure: CARDIOVERSION;  Surgeon: Quintella Reichert, MD;  Location: [facility] ENDOSCOPY;  Service: Cardiovascular;  Laterality: N/A;   CARDIOVERSION N/A 08/25/2014   Procedure: CARDIOVERSION;  Surgeon: Thurmon Fair, MD;  Location: [facility] ENDOSCOPY;  Service: Cardiovascular;  Laterality: N/A;   CARDIOVERSION N/A 05/28/2018   Procedure: CARDIOVERSION;  Surgeon: Chrystie Nose, MD;  Location: [facility] ENDOSCOPY;  Service: Cardiovascular;  Laterality: N/A;   CARDIOVERSION N/A 10/28/2019   Procedure: CARDIOVERSION;  Surgeon: Quintella Reichert, MD;  Location: [facility] ENDOSCOPY;  Service: Cardiovascular;  Laterality: N/A;   CARDIOVERSION N/A 09/01/2020   Procedure: CARDIOVERSION;  Surgeon: Lewayne Bunting, MD;  Location: [facility] ENDOSCOPY;  Service: Cardiovascular;  Laterality: N/A;   CARDIOVERSION N/A 04/04/2022   Procedure: CARDIOVERSION;  Surgeon: Chrystie Nose, MD;  Location: [facility] ENDOSCOPY;  Service: Cardiovascular;  Laterality: N/A;   CATARACT EXTRACTION W/ INTRAOCULAR LENS  IMPLANT, BILATERAL Bilateral    CHEST TUBE INSERTION Right 07/26/2017   Procedure: INSERTION PLEURAL DRAINAGE CATHETER - RIGHT;  Surgeon: Kerin Perna, MD;  Location: [facility] OR;  Service: Thoracic;  Laterality: Right;   CORONARY ARTERY BYPASS GRAFT  03/10/2008   x 3 Dr. Dorris Fetch   EP IMPLANTABLE DEVICE N/A 04/22/2015   Procedure: BiV ICD Insertion CRT-D;  Surgeon: Mayes Sangiovanni Jorja Loa, MD;  Location: [facility] INVASIVE CV LAB;  Service: Cardiovascular;  Laterality: N/A;   ESOPHAGOGASTRODUODENOSCOPY (EGD) WITH ESOPHAGEAL DILATION  X1   ESOPHAGOGASTRODUODENOSCOPY (EGD) WITH PROPOFOL N/A 05/09/2017    Procedure: ESOPHAGOGASTRODUODENOSCOPY (EGD) WITH PROPOFOL;  Surgeon: Jeani Hawking, MD;  Location: [facility] ENDOSCOPY;  Service: Endoscopy;  Laterality: N/A;   IR THORACENTESIS ASP PLEURAL SPACE W/IMG GUIDE  05/27/2017   IR THORACENTESIS ASP PLEURAL SPACE W/IMG GUIDE  06/17/2017   IR THORACENTESIS ASP PLEURAL SPACE W/IMG GUIDE  06/18/2017   IR THORACENTESIS ASP PLEURAL SPACE W/IMG GUIDE  07/11/2017   REMOVAL OF PLEURAL DRAINAGE CATHETER Right 09/13/2017   Procedure: REMOVAL OF PLEURAL DRAINAGE CATHETER;  Surgeon: Kerin Perna, MD;  Location: [facility] OR;  Service: Thoracic;  Laterality: Right;   RIGHT HEART CATH AND CORONARY/GRAFT ANGIOGRAPHY N/A 05/31/2017   Procedure: RIGHT HEART CATH AND CORONARY/GRAFT ANGIOGRAPHY;  Surgeon: Dolores Patty, MD;  Location: [facility] INVASIVE CV LAB;  Service: Cardiovascular;  Laterality: N/A;   TONSILLECTOMY       (Not in a hospital admission)   Inpatient Medications:   Allergies:  Allergies  Allergen Reactions   Ranolazine Other (See Comments)    States he went into a coma and was in hospital for 3 days after taking   Procaine Swelling and Other (See Comments)   Amiodarone Nausea And Vomiting and Other (See Comments)  Family History  Problem Relation Age of Onset   Alzheimer's disease Mother    COPD Daughter    Alzheimer's disease Other    COPD Sister    Depression Sister    Heart disease Sister    Hyperlipidemia Sister    Hypertension Sister    Esophageal cancer Sister        + smoker     Physical Exam: Vitals:   03/19/23 1014 03/19/23 1043  BP: (!) 147/92   Pulse: 88   Resp: 18   SpO2: 100%   Weight:  85.7 kg  Height:  6' (1.829 m)    GEN- NAD, A&O x 3, normal affect HEENT: Normocephalic, atraumatic Lungs- CTAB, Normal effort.  Heart- Regular rate and rhythm, No M/G/R.  GI- Soft, NT, ND.  Extremities- No clubbing, cyanosis, or edema   Radiology/Studies: DG Chest 2 View  Result Date: 03/19/2023 CLINICAL DATA:  Chest pain EXAM:  CHEST - 2 VIEW COMPARISON:  03/15/2023 FINDINGS: Stable right midlung linear scarring or atelectasis. No new infiltrate or overt edema. Stable left subclavian AICD. No pneumothorax. Heart size and mediastinal contours are within normal limits. Post AVR. CABG markers. No effusion. Sternotomy wires. Vertebral endplate spurring at multiple levels in the lower thoracic spine. IMPRESSION: No acute cardiopulmonary disease. Electronically Signed   By: Corlis Leak M.D.   On: 03/19/2023 13:15   DG Chest Portable 1 View  Result Date: 03/15/2023 CLINICAL DATA:  Generalized weakness EXAM: PORTABLE CHEST 1 VIEW COMPARISON:  09/18/2019 FINDINGS: Left AICD remains in place, unchanged. Prior CABG. Heart and mediastinal contours are within normal limits. No focal opacities or effusions. No acute bony abnormality. IMPRESSION: No active cardiopulmonary disease. Electronically Signed   By: Charlett Nose M.D.   On: 03/15/2023 20:47    EKG: VP, rate 85bpm (personally reviewed)  TELEMETRY: VP with intermittent VS (personally reviewed)  DEVICE HISTORY:  MDT BiV ICD, imp 04/2015; gen change 09/2022 Battery good Leads stable  Appears to have dual tachycardia with permanent afib Multiple episodes of VS events with different morphology than VS on presenting rhythm. Received ATP x 10 today   Assessment/Plan: #) VT storm #) presyncope Multiple episodes of MM VT this morning requiring ATP x 10 Plan to discontinue sotalol, ideally waiting 24 hours prior to initiating amiodarone If patient develops more VT, Siboney Requejo go ahead and start IV amiodarone load + gtt Increase mexiletine to 300mg  BID for now Continue 25mg  coreg BID Renexa allergy listed in chart, Dajaun Goldring discuss with patient prior to initiating  #) perm afib Transition to amiodarone as above Continue BB as above Continue 2.5mg  eliquis BID, dose reduced for age > 80, Cr > 1.5  #) HFrEF #) HTN LVEF 30-25% on 2021 TTE Permissive HTN at this time Cont coreg Holding  hydral for LH, dizziness previously thought orthostatic. Consider restarting if BP remains elevated Entresto, spiro contraindicated w eGFR<30 Continue 20mg  lasix  #) CAD No ischemic s/s currently Continue BB, statin, ASA, imdur  #) CKD Baseline Cr 2-2.2 Cr stable, cont to monitor  #) VHD s/p bioprosthetic AVR (2021) TTE shows AVR functioning well per 2021 TTE Consider updated TTE    For questions or updates, please contact [facility] Please consult [email or web address] for contact info under Cardiology/STEMI.  Signed, Sherie Don, NP  03/19/2023 1:38 PM   I have seen and examined this patient with Sherie Don.  Agree with above, note added to reflect my findings.  Patient with a past history as above.  He  presented to cardiology clinic this morning with multiple episodes of near syncope.  He had a syncopal episode in November and presented to the emergency room.  He was found to have had VT and received ICD therapy.  He has had continued episodes of VT this morning receiving 8 ATP therapies.  He had near syncope during these episodes.  Currently he feels well and is without complaint.  GEN: Well nourished, well developed, in no acute distress  HEENT: normal  Neck: no JVD, carotid bruits, or masses Cardiac: RRR; no murmurs, rubs, or gallops,no edema  Respiratory:  clear to auscultation bilaterally, normal work of breathing GI: soft, nontender, nondistended, + BS MS: no deformity or atrophy  Skin: warm and dry, device site well healed Neuro:  Strength and sensation are intact Psych: euthymic mood, full affect   VT storm: Has had continued episodes of ventricular tachycardia receiving ATP therapy with symptoms of near syncope.  Currently on mexiletine and sotalol.  Due to continued episodes, Salisha Bardsley stop sotalol and start amiodarone.  Maggie Senseney continue mexiletine today.  As he had his most recent dose of sotalol this morning, we Isaiyah Feldhaus hold off on IV amiodarone overnight unless he has  further episodes of ventricular tachycardia, then would be able to start amiodarone overnight tonight. Permanent atrial fibrillation: Continue Eliquis Chronic systolic heart failure: No obvious volume overload.  Continue home Lasix Coronary artery disease: Currently not ischemic.  Continue home medications.  Joline Encalada M. Marti Acebo MD 03/19/2023 5:02 PM

## 2023-03-20 DIAGNOSIS — I472 Ventricular tachycardia, unspecified: Secondary | ICD-10-CM | POA: Diagnosis not present

## 2023-03-20 LAB — BASIC METABOLIC PANEL
Anion gap: 9 (ref 5–15)
BUN: 29 mg/dL — ABNORMAL HIGH (ref 8–23)
CO2: 22 mmol/L (ref 22–32)
Calcium: 8.6 mg/dL — ABNORMAL LOW (ref 8.9–10.3)
Chloride: 105 mmol/L (ref 98–111)
Creatinine, Ser: 1.97 mg/dL — ABNORMAL HIGH (ref 0.61–1.24)
GFR, Estimated: 32 mL/min — ABNORMAL LOW (ref >60)
Glucose, Bld: 122 mg/dL — ABNORMAL HIGH (ref 70–99)
Potassium: 4 mmol/L (ref 3.5–5.1)
Sodium: 136 mmol/L (ref 135–145)

## 2023-03-20 LAB — CBC
HCT: 40.3 % (ref 39.0–52.0)
Hemoglobin: 12.9 g/dL — ABNORMAL LOW (ref 13.0–17.0)
MCH: 29.4 pg (ref 26.0–34.0)
MCHC: 32 g/dL (ref 30.0–36.0)
MCV: 91.8 fL (ref 80.0–100.0)
Platelets: 253 10*3/uL (ref 150–400)
RBC: 4.39 MIL/uL (ref 4.22–5.81)
RDW: 14.1 % (ref 11.5–15.5)
WBC: 10.3 10*3/uL (ref 4.0–10.5)
nRBC: 0 % (ref 0.0–0.2)

## 2023-03-20 LAB — MAGNESIUM: Magnesium: 2.5 mg/dL — ABNORMAL HIGH (ref 1.7–2.4)

## 2023-03-20 MED ORDER — ORAL CARE MOUTH RINSE: 15.0000 mL | OROMUCOSAL | Status: DC | PRN

## 2023-03-20 NOTE — Evaluation (Signed)
Physical Therapy Evaluation Patient Details Name: [name] MRN: [id number] DOB: 08/20/1932 Today's Date: 03/20/2023  History of Present Illness  [name] is a 89 y.o. male who presented to ED with intermittent episodes of weakness and legs going out, worsening SOB, and ICD shock. PMH: [name], CAD, ICM s/p CRT-D, AS s/p bioprosthetic AVR, perm AFib, CKD stage 4, hyperthyroid, HTN, HLD   Clinical Impression  Pt admitted with above. Pt with c/o "I just can't seem to catch my breath". Pt functioning at contact guard with RW. Pt with good home set up and support. PTA pt indep without RW in home and used rollator when going to the grocery store. Pt to benefit from [facility] PT services to progress away from RW. Acute PT to cont to follow.        If plan is discharge home, recommend the following: A little help with walking and/or transfers;A little help with bathing/dressing/bathroom;Assist for transportation;Help with stairs or ramp for entrance   Can travel by private vehicle        Equipment Recommendations None recommended by PT  Recommendations for Other Services       Functional Status Assessment Patient has had a recent decline in their functional status and demonstrates the ability to make significant improvements in function in a reasonable and predictable amount of time.     Precautions / Restrictions Precautions Precautions: Fall Precaution Comments: watch SOB Restrictions Weight Bearing Restrictions: No      Mobility  Bed Mobility Overal bed mobility: Needs Assistance Bed Mobility: Supine to Sit     Supine to sit: Contact guard     General bed mobility comments: HOB flat, increased time but able to complete with use of bed rail    Transfers Overall transfer level: Needs assistance Equipment used: Rolling walker (2 wheels) Transfers: Sit to/from Stand Sit to Stand: Contact guard assist           General transfer comment: good hand placement, slow,  guarded    Ambulation/Gait Ambulation/Gait assistance: Contact guard assist Gait Distance (Feet): 160 Feet Assistive device: Rolling walker (2 wheels) Gait Pattern/deviations: Step-through pattern, Trunk flexed Gait velocity: dec Gait velocity interpretation: <1.31 ft/sec, indicative of household ambulator   General Gait Details: 3 standing rest breaks due to DOE, progressive trunk flexion able to correct with verbal cues, SpO2 dec to 89% on RA, recovered to 94% upon sitting in chair  Stairs            Wheelchair Mobility     Tilt Bed    Modified Rankin (Stroke Patients Only)       Balance Overall balance assessment: Mild deficits observed, not formally tested (benefits from RW for ambulation)                                           Pertinent Vitals/Pain Pain Assessment Pain Assessment: No/denies pain    Home Living Family/patient expects to be discharged to:: Private residence Living Arrangements: Spouse/significant other Available Help at Discharge: Family;Available 24 hours/day Type of Home: House Home Access: Ramped entrance       Home Layout: One level Home Equipment: Rollator (4 wheels)      Prior Function Prior Level of Function : Independent/Modified Independent             Mobility Comments: uses rollator when out in community and intermittently at home,  drives ADLs Comments: reports sponge bathing, reports doing the cooking     Extremity/Trunk Assessment   Upper Extremity Assessment Upper Extremity Assessment: Generalized weakness    Lower Extremity Assessment Lower Extremity Assessment: Generalized weakness    Cervical / Trunk Assessment Cervical / Trunk Assessment: Kyphotic  Communication   Communication Communication: Hearing impairment  Cognition Arousal: Alert Behavior During Therapy: WFL for tasks assessed/performed Overall Cognitive Status: Within Functional Limits for tasks assessed                                           General Comments General comments (skin integrity, edema, etc.): SpO2 dec to 89% on RA, noted DOE with amb    Exercises     Assessment/Plan    PT Assessment Patient needs continued PT services  PT Problem List Decreased strength;Decreased activity tolerance;Decreased balance;Decreased mobility       PT Treatment Interventions DME instruction;Gait training;Stair training;Functional mobility training;Balance training    PT Goals (Current goals can be found in the Care Plan section)  Acute Rehab PT Goals Patient Stated Goal: improve breathing PT Goal Formulation: With patient Time For Goal Achievement: 04/03/23 Potential to Achieve Goals: Good    Frequency Min 3X/week     Co-evaluation               AM-PAC PT "6 Clicks" Mobility  Outcome Measure Help needed turning from your back to your side while in a flat bed without using bedrails?: A Little Help needed moving from lying on your back to sitting on the side of a flat bed without using bedrails?: A Little Help needed moving to and from a bed to a chair (including a wheelchair)?: A Little Help needed standing up from a chair using your arms (e.g., wheelchair or bedside chair)?: A Little Help needed to walk in hospital room?: A Little Help needed climbing 3-5 steps with a railing? : A Lot 6 Click Score: 17    End of Session Equipment Utilized During Treatment: Gait belt Activity Tolerance:  (limited by DOE) Patient left: in chair;with call bell/phone within reach;with chair alarm set Nurse Communication: Mobility status PT Visit Diagnosis: Unsteadiness on feet (R26.81)    Time: 0102-7253 PT Time Calculation (min) (ACUTE ONLY): 19 min   Charges:   PT Evaluation $PT Eval Low Complexity: 1 Low   PT General Charges $$ ACUTE PT VISIT: 1 Visit         Lewis Shock, PT, DPT Acute Rehabilitation Services Secure chat preferred Office #: [phone]   Iona Hansen 03/20/2023, 1:19 PM

## 2023-03-20 NOTE — Progress Notes (Addendum)
Patient Name: [name] Date of Encounter: 03/20/2023  Primary Cardiologist: Armanda Magic, MD Electrophysiologist: Regan Lemming, MD  Interval Summary   Patient feels well this AM eating breakfast  Had recurrence of VT ~1700 12/10. IV amiodarone started, he is tolerating it well currently  Family at bedside updated  Vital Signs    Vitals:   03/20/23 0600 03/20/23 0630 03/20/23 0700 03/20/23 0730  BP: (!) 140/86 138/76 95/77 (!) 145/80  Pulse: 75 75 76 78  Resp: 15 15 16 18   Temp:      TempSrc:      SpO2: 97% 95% 97% 97%  Weight: 88 kg     Height:        Intake/Output Summary (Last 24 hours) at 03/20/2023 0817 Last data filed at 03/20/2023 0700 Gross per 24 hour  Intake 263.63 ml  Output 725 ml  Net -461.37 ml   Filed Weights   03/19/23 1043 03/20/23 0600  Weight: 85.7 kg 88 kg    Physical Exam    GEN- The patient is well appearing, alert and oriented x 3 today.   Lungs- Clear to ausculation bilaterally, normal work of breathing Cardiac- Regular rate and rhythm, no murmurs, rubs or gallops GI- soft, NT, ND, + BS Extremities- no clubbing or cyanosis. No edema  Telemetry    Intermittent AP-VP 12/10 at ~1700 - MM VT converted with ATP x 1 No further VT   (personally reviewed)  Hospital Course    [name] is a 89 y.o. male with PMH of HFrEF, CAD, ICM s/p CRT-D, AS s/p bioprosthetic AVR, perm AFib, CKD stage 4, hyperthyroid, HTN, HLD admitted for VT storm  Assessment & Plan    #) VT storm Recurrence of VT 12/10, treated w ATP x 1 Bolus + gtt amiodarone started Continue IV amiodarone x 48h Continue mexiletine 300mg  BID Continue 25mg  coreg BID Renexa allergy listed in chart, [name] discuss with patient prior to initiating Keep K > 4, Mag > 2 If VT remains quiescent, consider tx out of ICU late afternoon   #) perm afib Continue 2.5mg  eliquis BID, dose reduced for age > 80, Cr > 1.5   #) HFrEF #) HTN HTN improved Cont  coreg Holding hydral for LH, dizziness previously thought orthostatic. Consider restarting if BP remains elevated Entresto, spiro contraindicated w eGFR<30 Continue 20mg  lasix   #) CAD No ischemic s/s currently Continue BB, statin, ASA, imdur   #) CKD Baseline Cr 2-2.2 Cr stable, cont to monitor   #) VHD s/p bioprosthetic AVR (2021) TTE shows AVR functioning well per 2021 TTE Consider updated TTE     For questions or updates, please contact [facility] Please consult [email or web address] for contact info under Cardiology/STEMI.  Signed, [name] Don, NP  03/20/2023, 8:17 AM   I have seen and examined this patient with [name].  Agree with above, note added to reflect my findings.  Well today.  Had 1 episode of VT yesterday evening with ATP.  Has been started on IV amiodarone.  GEN: Well nourished, well developed, in no acute distress  HEENT: normal  Neck: no JVD, carotid bruits, or masses Cardiac: RRR; no murmurs, rubs, or gallops,no edema  Respiratory:  clear to auscultation bilaterally, normal work of breathing GI: soft, nontender, nondistended, + BS MS: no deformity or atrophy  Skin: warm and dry, device site well healed Neuro:  Strength and sensation are intact Psych: euthymic mood, full affect   VT storm: Currently on IV amiodarone  and mexiletine.  Continue carvedilol.  No further episodes since yesterday evening. Chronic systolic heart failure: No obvious volume overload. Coronary artery disease: Troponin not elevated.  Continue current management. Post AVR: Stable on most recent echo CKD stage IIIb: Remains stable  [name] Santillano M. [name] Bachus MD 03/20/2023 9:56 AM

## 2023-03-21 DIAGNOSIS — I472 Ventricular tachycardia, unspecified: Secondary | ICD-10-CM | POA: Diagnosis not present

## 2023-03-21 LAB — BASIC METABOLIC PANEL
Anion gap: 9 (ref 5–15)
BUN: 27 mg/dL — ABNORMAL HIGH (ref 8–23)
CO2: 23 mmol/L (ref 22–32)
Calcium: 8.7 mg/dL — ABNORMAL LOW (ref 8.9–10.3)
Chloride: 104 mmol/L (ref 98–111)
Creatinine, Ser: 2.19 mg/dL — ABNORMAL HIGH (ref 0.61–1.24)
GFR, Estimated: 28 mL/min — ABNORMAL LOW (ref >60)
Glucose, Bld: 120 mg/dL — ABNORMAL HIGH (ref 70–99)
Potassium: 4.3 mmol/L (ref 3.5–5.1)
Sodium: 136 mmol/L (ref 135–145)

## 2023-03-21 LAB — MAGNESIUM: Magnesium: 2.2 mg/dL (ref 1.7–2.4)

## 2023-03-21 MED ORDER — AMIODARONE HCL 200 MG PO TABS
200.0000 mg | ORAL_TABLET | Freq: Two times a day (BID) | ORAL | Status: DC
  Administered 2023-03-21: 200 mg via ORAL
  Filled 2023-03-21 (×2): qty 1

## 2023-03-21 MED ORDER — FAMOTIDINE 20 MG PO TABS
10.0000 mg | ORAL_TABLET | Freq: Every day | ORAL | Status: DC
Start: 2023-03-21 — End: 2023-04-02
  Administered 2023-03-21 – 2023-04-01 (×12): 10 mg via ORAL
  Filled 2023-03-21 (×12): qty 1

## 2023-03-21 MED ORDER — ICOSAPENT ETHYL 1 G PO CAPS
2.0000 g | ORAL_CAPSULE | Freq: Two times a day (BID) | ORAL | Status: DC
Start: 2023-03-21 — End: 2023-04-02
  Administered 2023-03-21 – 2023-04-02 (×24): 2 g via ORAL
  Filled 2023-03-21 (×24): qty 2

## 2023-03-21 NOTE — Progress Notes (Signed)
Patient belongings were returned, SWOT to assist with transfer. [id number].

## 2023-03-21 NOTE — Progress Notes (Signed)
Pt received from 2H.  Alert, HOH, no complaints.  VSS stable and charted.  CCMD notified.  Will cont plan of care

## 2023-03-21 NOTE — Progress Notes (Signed)
Mobility Specialist Progress Note:   03/21/23 1222  Mobility  Activity Ambulated with assistance in room  Level of Assistance Contact guard assist, steadying assist  Assistive Device Front wheel walker  Distance Ambulated (ft) 60 ft  Activity Response Tolerated well  Mobility Referral Yes  Mobility visit 1 Mobility  Mobility Specialist Start Time (ACUTE ONLY) 1134  Mobility Specialist Stop Time (ACUTE ONLY) 1145  Mobility Specialist Time Calculation (min) (ACUTE ONLY) 11 min   Pre Mobility: 81 HR  During Mobility: 95 HR  Post Mobility: 88 HR ,17/71 BP  Pt received in bed, agreeable to mobility. CG to stand and ambulate in room. Pt c/o slight dizziness upon standing, otherwise asx throughout. No unsteadiness or LOB during ambulation. Pt left in bed asymptomatic with call bell in reach and all needs met.   [name]  Mobility Specialist Please contact via [employer] office at [phone]

## 2023-03-21 NOTE — Progress Notes (Addendum)
Patient Name: [name] Date of Encounter: 03/21/2023  Primary Cardiologist: Armanda Magic, MD Electrophysiologist: Regan Lemming, MD  Interval Summary   Packed to transfer to 4e. No complaints on IV amiodarone  He denies chest pain, chest pressure, GI complaints  Vital Signs    Vitals:   03/21/23 0900 03/21/23 0931 03/21/23 0956 03/21/23 1156  BP: 131/71  130/74 116/68  Pulse: 75 87 77 79  Resp: 17 19 20 20   Temp:   98.1 F (36.7 C) (!) 97.5 F (36.4 C)  TempSrc:   Oral Oral  SpO2: 98% 97% 98% 98%  Weight:      Height:        Intake/Output Summary (Last 24 hours) at 03/21/2023 1159 Last data filed at 03/21/2023 1158 Gross per 24 hour  Intake 884.95 ml  Output 1275 ml  Net -390.05 ml   Filed Weights   03/19/23 1043 03/20/23 0600  Weight: 85.7 kg 88 kg    Physical Exam    GEN- The patient is well appearing, alert and oriented x 3 today.   Lungs- Clear to ausculation bilaterally, normal work of breathing Cardiac- Regular rate and rhythm, no murmurs, rubs or gallops GI- soft, NT, ND, + BS Extremities- no clubbing or cyanosis. No edema  Telemetry    VP at 75 No further VT   (personally reviewed)  Hospital Course    [name] is a 89 y.o. male with PMH of HFrEF, CAD, ICM s/p CRT-D, AS s/p bioprosthetic AVR, perm AFib, CKD stage 4, hyperthyroid, HTN, HLD admitted for VT storm  Assessment & Plan    #) VT storm Recurrence of VT 12/10, treated w ATP x 1 Bolus + gtt amiodarone started VT quiescent since 12/10 Continue IV amiodarone x 48h >> tx to PO amio this afternoon/evening when current bag is finished, then would start 200mg  BID x 1 month Continue mexiletine 300mg  BID Continue 25mg  coreg BID Keep K > 4, Mag > 2 If VT remains quiescent, consider tx out of ICU late afternoon   #) perm afib Continue 2.5mg  eliquis BID, dose reduced for age > 80, Cr > 1.5   #) HFrEF #) HTN HTN improved Cont coreg Holding hydral for LH, dizziness  previously thought orthostatic. Consider restarting if BP remains elevated Entresto, spiro contraindicated w eGFR<30 Continue 20mg  lasix   #) CAD No ischemic s/s currently Continue BB, statin, ASA, imdur   #) CKD Baseline Cr 2-2.2 Cr stable, cont to monitor   #) VHD s/p bioprosthetic AVR (2021) TTE shows AVR functioning well per 2021 TTE Consider updated TTE     For questions or updates, please contact [facility] Please consult [email or web address] for contact info under Cardiology/STEMI.  Signed, Sherie Don, NP  03/21/2023, 11:59 AM   I have seen and examined this patient with Sherie Don.  Agree with above, note added to reflect my findings.  Feeling well without complaint, no further VT.  GEN: Well nourished, well developed, in no acute distress  HEENT: normal  Neck: no JVD, carotid bruits, or masses Cardiac: RRR; no murmurs, rubs, or gallops,no edema  Respiratory:  clear to auscultation bilaterally, normal work of breathing GI: soft, nontender, nondistended, + BS MS: no deformity or atrophy  Skin: warm and dry, device site well healed Neuro:  Strength and sensation are intact Psych: euthymic mood, full affect   VT storm: Currently on IV amiodarone and mexiletine.  [name] switch to p.o. amiodarone today.  If he remains without VT,  we [name] plan for likely discharge tomorrow. Coronary artery disease: Stable with no chest pain.  Troponin is not elevated this admission. Chronic systolic heart failure: No obvious volume overload.  Continue home medications. CKD stage IV: Stable Post AVR: Stable on most recent echo.  [name] Godeaux M. Beverly Ferner MD 03/21/2023 2:50 PM

## 2023-03-22 DIAGNOSIS — I472 Ventricular tachycardia, unspecified: Secondary | ICD-10-CM | POA: Diagnosis not present

## 2023-03-22 LAB — BASIC METABOLIC PANEL
Anion gap: 9 (ref 5–15)
BUN: 35 mg/dL — ABNORMAL HIGH (ref 8–23)
CO2: 22 mmol/L (ref 22–32)
Calcium: 8.6 mg/dL — ABNORMAL LOW (ref 8.9–10.3)
Chloride: 103 mmol/L (ref 98–111)
Creatinine, Ser: 2.29 mg/dL — ABNORMAL HIGH (ref 0.61–1.24)
GFR, Estimated: 27 mL/min — ABNORMAL LOW (ref >60)
Glucose, Bld: 124 mg/dL — ABNORMAL HIGH (ref 70–99)
Potassium: 4 mmol/L (ref 3.5–5.1)
Sodium: 134 mmol/L — ABNORMAL LOW (ref 135–145)

## 2023-03-22 LAB — MAGNESIUM: Magnesium: 2.4 mg/dL (ref 1.7–2.4)

## 2023-03-22 MED ORDER — AMIODARONE LOAD VIA INFUSION
150.0000 mg | Freq: Once | INTRAVENOUS | Status: AC
  Administered 2023-03-22: 150 mg via INTRAVENOUS
  Filled 2023-03-22: qty 83.34

## 2023-03-22 MED ORDER — AMIODARONE HCL IN DEXTROSE 360-4.14 MG/200ML-% IV SOLN
30.0000 mg/h | INTRAVENOUS | Status: DC
  Administered 2023-03-22 – 2023-03-28 (×20): 60 mg/h via INTRAVENOUS
  Administered 2023-03-29 (×2): 30 mg/h via INTRAVENOUS
  Filled 2023-03-22 (×27): qty 200

## 2023-03-22 MED ORDER — AMIODARONE HCL IN DEXTROSE 360-4.14 MG/200ML-% IV SOLN
60.0000 mg/h | INTRAVENOUS | Status: DC
  Administered 2023-03-22 (×2): 60 mg/h via INTRAVENOUS
  Filled 2023-03-22 (×2): qty 200

## 2023-03-22 MED ORDER — AMIODARONE HCL IN DEXTROSE 360-4.14 MG/200ML-% IV SOLN: 30.0000 mg/h | INTRAVENOUS | Status: DC

## 2023-03-22 NOTE — Plan of Care (Signed)
  Problem: Education: Goal: Understanding of cardiac disease, CV risk reduction, and recovery process will improve Outcome: Progressing   Problem: Cardiac: Goal: Ability to achieve and maintain adequate cardiovascular perfusion will improve Outcome: Progressing   

## 2023-03-22 NOTE — Plan of Care (Signed)
  Problem: Education: Goal: Understanding of cardiac disease, CV risk reduction, and recovery process will improve Outcome: Progressing   Problem: Activity: Goal: Ability to tolerate increased activity will improve Outcome: Progressing   Problem: Cardiac: Goal: Ability to achieve and maintain adequate cardiovascular perfusion will improve Outcome: Progressing   

## 2023-03-22 NOTE — Progress Notes (Signed)
Notified of more VT Pt had sustained VT at 1410 > 146 did eventually get ATP successfully and has had a  number of episodes this morning Hemodynamically tolerated Dr. Lalla Brothers to bedside Reviewed device programming Given amiodarone and some slowing of his VT VT1 zone detection rate lowered to 130 (from 140), no changes to  therapies Added a monitor zone at 115bpm  Will transfer back to 2H Rebolus amiodarone and run the gtt at 60/hr without down-titration  Francis Dowse, PA-C

## 2023-03-22 NOTE — Progress Notes (Cosign Needed Addendum)
Rounding Note    Patient Name: [name] Date of Encounter: 03/22/2023  [facility] Cardiologist: Armanda Magic, MD   Subjective   No complaints, tolerating amidoarone, no SOB, was unaware of VT this AM  Inpatient Medications    Scheduled Meds:  amiodarone  150 mg Intravenous Once   apixaban  2.5 mg Oral BID   atorvastatin  40 mg Oral Daily   carvedilol  25 mg Oral BID WC   Chlorhexidine Gluconate Cloth  6 each Topical Q0600   famotidine  10 mg Oral QHS   furosemide  20 mg Oral Daily   icosapent Ethyl  2 g Oral BID   isosorbide mononitrate  30 mg Oral Daily   mexiletine  300 mg Oral Q12H   multivitamin with minerals  1 tablet Oral Daily   mupirocin ointment  1 Application Nasal BID   pantoprazole  40 mg Oral QAC breakfast   sodium bicarbonate  650 mg Oral Daily   Continuous Infusions:  amiodarone     Followed by   amiodarone     PRN Meds: acetaminophen, albuterol, nitroGLYCERIN, mouth rinse   Vital Signs    Vitals:   03/21/23 1156 03/21/23 2034 03/21/23 2247 03/22/23 0334  BP: 116/68 119/68 (!) 111/59 120/66  Pulse: 79   74  Resp: 20 18 18 15   Temp: (!) 97.5 F (36.4 C) (!) 97.4 F (36.3 C)  (!) 97.5 F (36.4 C)  TempSrc: Oral Oral  Oral  SpO2: 98%   98%  Weight:      Height:        Intake/Output Summary (Last 24 hours) at 03/22/2023 0746 Last data filed at 03/22/2023 0300 Gross per 24 hour  Intake 453.62 ml  Output 1200 ml  Net -746.38 ml      03/20/2023    6:00 AM 03/19/2023   10:43 AM 03/19/2023    8:07 AM  Last 3 Weights  Weight (lbs) 194 lb 0.1 oz 189 lb 197 lb  Weight (kg) 88 kg 85.73 kg 89.359 kg      Telemetry    AF/ Vpaced, sustained MMVT > ATP > success - Personally Reviewed  ECG    No new EKGs - Personally Reviewed  Physical Exam   GEN: No acute distress, elderly, chronically ill appearing   Neck: No JVD Cardiac: RRR, no murmurs, rubs, or gallops.  Respiratory: CTA b/l. GI: Soft, nontender,  non-distended  MS: No edema; No deformity. Neuro:  Nonfocal  Psych: Normal affect   Labs    High Sensitivity Troponin:   Recent Labs  Lab 03/15/23 2020 03/15/23 2200 03/19/23 1052 03/19/23 1245  TROPONINIHS 16 11 21* 20*     Chemistry Recent Labs  Lab 03/20/23 0215 03/21/23 0224 03/22/23 0325  NA 136 136 134*  K 4.0 4.3 4.0  CL 105 104 103  CO2 22 23 22   GLUCOSE 122* 120* 124*  BUN 29* 27* 35*  CREATININE 1.97* 2.19* 2.29*  CALCIUM 8.6* 8.7* 8.6*  MG 2.5* 2.2 2.4  GFRNONAA 32* 28* 27*  ANIONGAP 9 9 9     Lipids No results for input(s): "CHOL", "TRIG", "HDL", "LABVLDL", "LDLCALC", "CHOLHDL" in the last 168 hours.  Hematology Recent Labs  Lab 03/15/23 2020 03/19/23 1052 03/20/23 0215  WBC 11.8* 8.4 10.3  RBC 4.22 4.53 4.39  HGB 12.7* 13.4 12.9*  HCT 39.7 42.4 40.3  MCV 94.1 93.6 91.8  MCH 30.1 29.6 29.4  MCHC 32.0 31.6 32.0  RDW 14.2 14.2  14.1  PLT 248 289 253   Thyroid No results for input(s): "TSH", "FREET4" in the last 168 hours.  BNPNo results for input(s): "BNP", "PROBNP" in the last 168 hours.  DDimer No results for input(s): "DDIMER" in the last 168 hours.   Radiology    No results found.  Cardiac Studies   09/07/2019: TTE IMPRESSIONS   1. Left ventricular ejection fraction, by estimation, is 30 to 35%. The  left ventricle has moderately decreased function. Left ventricular  endocardial border not optimally defined to evaluate regional wall motion.  Left ventricular diastolic parameters  are consistent with Grade II diastolic dysfunction (pseudonormalization).  Elevated left ventricular end-diastolic pressure.   2. Right ventricular systolic function is moderately reduced. The right  ventricular size is normal. Tricuspid regurgitation signal is inadequate  for assessing PA pressure.   3. Left atrial size was mildly dilated.   4. The mitral valve is abnormal. Trivial mitral valve regurgitation. No  evidence of mitral stenosis.   5. The  aortic valve has been repaired/replaced. Aortic valve  regurgitation is not visualized. There is a 23 mm Magna Ease pericardial  valve present in the aortic position. Procedure Date: 03/10/2008. Echo  findings are consistent with normal structure and   function of the aortic valve prosthesis. Aortic valve mean gradient  measures 6.0 mmHg.      05/31/17; R/LHC Assessment: 1. 3v CAD with stable revascularization with patent LIMA-LAD, SVG-diagonal and SVG-RCA 2. 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 Normal cardiac output - no evidence of thyrotoxicosis heart disease or PH   05/27/17: TTE Study Conclusions - Left ventricle: The cavity size was normal. Wall thickness was   increased in a pattern of mild LVH. Systolic function was   severely reduced. The estimated ejection fraction was in the   range of 20% to 25%. There is akinesis of the inferolateral,   inferior, and inferoseptal myocardium. Features are consistent   with a pseudonormal left ventricular filling pattern, with   concomitant abnormal relaxation and increased filling pressure   (grade 2 diastolic dysfunction). - Aortic valve: A pericardial tissue valve bioprosthesis was   present and functioning normally. Peak velocity (S): 226 cm/s.   Mean gradient (S): 10 mm Hg. Valve area (VTI): 1.52 cm^2. Valve   area (Vmax): 1.5 cm^2. Valve area (Vmean): 1.59 cm^2. - Mitral valve: Calcified annulus. Mildly thickened leaflets .   There was mild regurgitation. - Left atrium: The atrium was severely dilated. - Right ventricle: Pacer wire or catheter noted in right ventricle. - Pulmonary arteries: Systolic pressure was mildly increased. PA   peak pressure: 35 mm Hg (S). Impressions: - Compared to the prior study, there has been no significant   interval change.    Patient Profile     89 y.o. male with history of CAD/VHD s/p CABG/bioprosthetic AVR in 2009, chronic CHF (systolic),  ICM, PAF, orthostatic hypotension, VT, ICD admitted for VT storm   Device information MDT CRT-D, implanted1/13/17 + hx of numerous appropriate therapies VT     AAD Hx: Amiodarone stopped Jan 2019, 2/2 SOB,  No clear finding of amio tox,m? Thyroid issues also mentioned in places >> mexiletine started More VT and sotalol was added Dec 2020  THIS admission Sotalol stopped >> amiodarone    Assessment & Plan    VT storm Transitioned to PO amiodarone yesterday (12/12) > more VT early this AM had ATP Transition back to IV  amiodarone, likely 48 more hours Tolerating so far, denies SOB  Continue coreg and mexiletine (up to 300mg  BID this admission)  Permanent AFib CHA2DS2Vasc is 5, on Eliquis, appropriately dosed (age/creat) Rate controlled/paced  ICM Chronic CHF (systolic) VHD (hx of bioprosthetic AVR) Volume stable  CAD No ischemic symptoms Neg HS Trops Continue home meds Last cath 2019 with patent grafts Long hx of VT Scar mid anterior, mid inferior, apical inferior, apical lateral and apex on his stress test 2016  CKD (IIIb) Stable  For questions or updates, please contact [facility] Please consult [email or web address] for contact info under        Signed, Sheilah Pigeon, PA-C  03/22/2023, 7:46 AM

## 2023-03-22 NOTE — Progress Notes (Signed)
Physical Therapy Treatment Patient Details Name: [name] MRN: [id number] DOB: [birth year] Today's Date: 03/22/2023   History of Present Illness [name] is an 89 y.o. male admitted 12/10 with intermittent episodes of weakness, legs going out, worsening SOB, and ICD shock due to VT. PMH: HFrEF, CAD, ICM s/p CRT-D, AS s/p bioprosthetic AVR, perm AFib, CKD stage 4, hyperthyroidism, HTN, HLD    PT Comments  Pt pleasant and agreeable to mobility. Pt  required increased time with bed mobility, reported being tired and slowed because he had just received a new IV site. Pt tolerated increased ambulation distance with no reported SOB, cueing for upright posture and staying in bounds of RW. Pt rapidly descended into recliner from increased distance after ambulation, pt stated he normally reaches back and lowers self down but was tired and wanted to rush it. Pt educated for safety when sitting and completed repeated STS trials with mod cueing for controlled descent.    If plan is discharge home, recommend the following: A little help with walking and/or transfers;A little help with bathing/dressing/bathroom;Assist for transportation;Help with stairs or ramp for entrance   Can travel by private vehicle        Equipment Recommendations  None recommended by PT    Recommendations for Other Services       Precautions / Restrictions Precautions Precautions: Fall Precaution Comments: watch SOB Restrictions Weight Bearing Restrictions Per Provider Order: No     Mobility  Bed Mobility Overal bed mobility: Needs Assistance Bed Mobility: Supine to Sit     Supine to sit: Contact guard, Used rails     General bed mobility comments: increased time, use of bed rail, tactile cueing for trunk elevation    Transfers Overall transfer level: Needs assistance Equipment used: Rolling walker (2 wheels) Transfers: Sit to/from Stand Sit to Stand: Contact guard assist           General transfer  comment: 6xSTS dueing session, from EOB CGA for tactile cue bring hips forward, in chair cueing for hand placement and controled descent    Ambulation/Gait Ambulation/Gait assistance: Contact guard assist Gait Distance (Feet): 200 Feet Assistive device: Rolling walker (2 wheels) Gait Pattern/deviations: Step-through pattern, Trunk flexed   Gait velocity interpretation: <1.31 ft/sec, indicative of household ambulator   General Gait Details: ambulated with no rest breaks, verbal cueing for upright posture corrected for short time then reverted back to head down   Stairs             Wheelchair Mobility     Tilt Bed    Modified Rankin (Stroke Patients Only)       Balance Overall balance assessment: Needs assistance Sitting-balance support: Feet supported, No upper extremity supported, Bilateral upper extremity supported Sitting balance-Leahy Scale: Fair     Standing balance support: Bilateral upper extremity supported, During functional activity Standing balance-Leahy Scale: Fair Standing balance comment: use of RW, no LOB                            Cognition Arousal: Alert Behavior During Therapy: WFL for tasks assessed/performed Overall Cognitive Status: Within Functional Limits for tasks assessed                                          Exercises Other Exercises Other Exercises: 5x STS    General Comments  Pertinent Vitals/Pain Pain Assessment Pain Assessment: No/denies pain    Home Living                          Prior Function            PT Goals (current goals can now be found in the care plan section) Progress towards PT goals: Progressing toward goals    Frequency    Min 3X/week      PT Plan      Co-evaluation              AM-PAC PT "6 Clicks" Mobility   Outcome Measure  Help needed turning from your back to your side while in a flat bed without using bedrails?: A Little Help  needed moving from lying on your back to sitting on the side of a flat bed without using bedrails?: A Little Help needed moving to and from a bed to a chair (including a wheelchair)?: A Little Help needed standing up from a chair using your arms (e.g., wheelchair or bedside chair)?: A Little Help needed to walk in hospital room?: A Little Help needed climbing 3-5 steps with a railing? : A Lot 6 Click Score: 17    End of Session Equipment Utilized During Treatment: Gait belt Activity Tolerance: Patient tolerated treatment well Patient left: in chair;with call bell/phone within reach;with chair alarm set Nurse Communication: Mobility status PT Visit Diagnosis: Unsteadiness on feet (R26.81)     Time: 1000-1024 PT Time Calculation (min) (ACUTE ONLY): 24 min  Charges:    $Gait Training: 8-22 mins $Therapeutic Activity: 8-22 mins PT General Charges $$ ACUTE PT VISIT: 1 Visit                     Andrey Farmer SPT Secure chat preferred    Darlin Drop 03/22/2023, 11:40 AM

## 2023-03-22 NOTE — Progress Notes (Signed)
Notified via central telemetry patient had a run of VT. On call paged and notified. Awaiting response.

## 2023-03-22 NOTE — TOC Initial Note (Addendum)
Transition of Care [facility]) - Initial/Assessment Note    Patient Details  Name: [name] MRN: [id number] Date of Birth: 25-Dec-1932  [facility] CM/SW Contact:    Elliot Cousin, RN Phone Number: 03/22/2023, 3:58 PM  Clinical Narrative:                  CM spoke to pt and dtr at bedside. Pt lives at home with wife.  Pt was able to drive short distance. Had HH in the past with [facility]. Agreeable with [facility] for HH.  Will continue to follow for dc needs.   Expected Discharge Plan: Home w Home Health Services Barriers to Discharge: Continued Medical Work up   Patient Goals and CMS Choice Patient states their goals for this hospitalization and ongoing recovery are:: wants to get better          Expected Discharge Plan and Services   Discharge Planning Services: CM Consult   Living arrangements for the past 2 months: Single Family Home                                      Prior Living Arrangements/Services Living arrangements for the past 2 months: Single Family Home Lives with:: Spouse Patient language and need for interpreter reviewed:: Yes Do you feel safe going back to the place where you live?: Yes      Need for Family Participation in Patient Care: Yes (Comment) Care giver support system in place?: Yes (comment) Current home services: DME (rolling walker) Criminal Activity/Legal Involvement Pertinent to Current Situation/Hospitalization: No - Comment as needed  Activities of Daily Living   ADL Screening (condition at time of admission) Independently performs ADLs?: Yes (appropriate for developmental age) Is the patient deaf or have difficulty hearing?: Yes Does the patient have difficulty seeing, even when wearing glasses/contacts?: No Does the patient have difficulty concentrating, remembering, or making decisions?: No  Permission Sought/Granted Permission sought to share information with : Case Manager, Family Supports,  PCP Permission granted to share information with : Yes, Verbal Permission Granted  Share Information with NAME: Hiep Ripley     Permission granted to share info w Relationship: daughter  Permission granted to share info w Contact Information: [phone]  Emotional Assessment Appearance:: Appears stated age Attitude/Demeanor/Rapport: Engaged Affect (typically observed): Accepting     Psych Involvement: No (comment)  Admission diagnosis:  Ventricular tachycardia (HCC) [I47.20] VT (ventricular tachycardia) (HCC) [I47.20] Patient Active Problem List   Diagnosis Date Noted   Hypercoagulable state due to persistent atrial fibrillation (HCC) 07/19/2022   Orthostatic hypotension 09/11/2019   Ileus (HCC) 09/04/2019   Hyponatremia 09/03/2019   Metabolic acidosis 09/03/2019   Diverticulitis 09/02/2019   Acute urinary retention 09/02/2019   Lower extremity weakness 11/29/2018   Multiple thyroid nodules 08/13/2017   DCM (dilated cardiomyopathy) (HCC) 06/16/2017   History of aortic valve replacement with bioprosthetic valve    Cardiac resynchronization therapy defibrillator (CRT-D) in place    Protein-calorie malnutrition, severe 05/27/2017   Hyperthyroidism 05/26/2017   Cough 05/26/2017   Pleural effusion on right 05/26/2017   Malnutrition of moderate degree 05/09/2017   GI bleed 05/08/2017   VT (ventricular tachycardia) (HCC) 05/06/2017   Dizziness 04/23/2017   Weight loss 04/23/2017   Leg weakness 01/28/2017   Chronic combined systolic (congestive) and diastolic (congestive) heart failure (HCC)    Shingles 08/23/2014   Stage 4 chronic kidney disease (  HCC) 08/23/2014   Hyperkalemia 08/23/2014   Essential hypertension 01/23/2013   Dyslipidemia 01/23/2013   Coronary artery disease 01/23/2013   Persistent atrial fibrillation (HCC) 01/23/2013   Chronic anticoagulation 01/23/2013   PCP:  Joycelyn Rua, MD Pharmacy:   [facility] [phone] - [place], [place] - [address] [place] [address] Phone: [phone] Fax: [phone]  [facility] - [place], [place] - [address] [place] [address] Phone: [phone] Fax: [phone]     Social Drivers of Health (SDOH) Social History: SDOH Screenings   Food Insecurity: No Food Insecurity (03/19/2023)  Housing: Low Risk  (03/21/2023)  Transportation Needs: No Transportation Needs (03/19/2023)  Utilities: Not At Risk (03/19/2023)  Tobacco Use: Low Risk  (03/19/2023)   SDOH Interventions: Housing Interventions: Intervention Not Indicated   Readmission Risk Interventions     No data to display

## 2023-03-22 NOTE — Plan of Care (Signed)
  Problem: Education: Goal: Understanding of cardiac disease, CV risk reduction, and recovery process will improve Outcome: Progressing Goal: Individualized Educational Video(s) Outcome: Progressing   Problem: Cardiac: Goal: Ability to achieve and maintain adequate cardiovascular perfusion will improve Outcome: Progressing   Problem: Health Behavior/Discharge Planning: Goal: Ability to safely manage health-related needs after discharge will improve Outcome: Progressing

## 2023-03-23 DIAGNOSIS — I472 Ventricular tachycardia, unspecified: Secondary | ICD-10-CM | POA: Diagnosis not present

## 2023-03-23 LAB — BASIC METABOLIC PANEL
Anion gap: 8 (ref 5–15)
BUN: 36 mg/dL — ABNORMAL HIGH (ref 8–23)
CO2: 21 mmol/L — ABNORMAL LOW (ref 22–32)
Calcium: 8.3 mg/dL — ABNORMAL LOW (ref 8.9–10.3)
Chloride: 104 mmol/L (ref 98–111)
Creatinine, Ser: 2.25 mg/dL — ABNORMAL HIGH (ref 0.61–1.24)
GFR, Estimated: 27 mL/min — ABNORMAL LOW (ref >60)
Glucose, Bld: 129 mg/dL — ABNORMAL HIGH (ref 70–99)
Potassium: 3.8 mmol/L (ref 3.5–5.1)
Sodium: 133 mmol/L — ABNORMAL LOW (ref 135–145)

## 2023-03-23 LAB — MAGNESIUM: Magnesium: 2 mg/dL (ref 1.7–2.4)

## 2023-03-23 MED ORDER — POTASSIUM CHLORIDE CRYS ER 20 MEQ PO TBCR
20.0000 meq | EXTENDED_RELEASE_TABLET | Freq: Once | ORAL | Status: AC
  Administered 2023-03-23: 20 meq via ORAL
  Filled 2023-03-23: qty 1

## 2023-03-23 NOTE — Progress Notes (Signed)
   Rounding Note    Patient Name: [name] Date of Encounter: 03/23/2023  [facility] Cardiologist: Armanda Magic, MD   Subjective   More VT yesterday evening ~ 1900 receiving 4 rounds of ATP. Each round successful.  Vital Signs    Vitals:   03/23/23 0200 03/23/23 0230 03/23/23 0322 03/23/23 0422  BP: 114/62 121/67 126/65   Pulse: 75 75 75   Resp: (!) 9 12 14    Temp:    98.4 F (36.9 C)  TempSrc:    Oral  SpO2: 96% 97% 97%   Weight:      Height:        Intake/Output Summary (Last 24 hours) at 03/23/2023 0729 Last data filed at 03/23/2023 0300 Gross per 24 hour  Intake 984.24 ml  Output 1225 ml  Net -240.76 ml      03/20/2023    6:00 AM 03/19/2023   10:43 AM 03/19/2023    8:07 AM  Last 3 Weights  Weight (lbs) 194 lb 0.1 oz 189 lb 197 lb  Weight (kg) 88 kg 85.73 kg 89.359 kg      Telemetry    Personally Reviewed  ECG    Personally Reviewed  Physical Exam   GEN: No acute distress.   Cardiac: RRR, no murmurs, rubs, or gallops.  Respiratory: Clear to auscultation bilaterally. Psych: Normal affect   Assessment & Plan    #VT Storm #ICM #HFrEF cont amio gtt Cont coreg Cont mexiletine Not a candidate for invasive EP procedures.  ICD has been reprogrammed to provide earlier therapy.    [name], MD, [facility], [facility] Cardiac Electrophysiology

## 2023-03-23 NOTE — Plan of Care (Signed)

## 2023-03-24 ENCOUNTER — Encounter: Payer: Self-pay | Admitting: Cardiology

## 2023-03-24 DIAGNOSIS — I472 Ventricular tachycardia, unspecified: Secondary | ICD-10-CM | POA: Diagnosis not present

## 2023-03-24 LAB — BASIC METABOLIC PANEL
Anion gap: 11 (ref 5–15)
BUN: 36 mg/dL — ABNORMAL HIGH (ref 8–23)
CO2: 21 mmol/L — ABNORMAL LOW (ref 22–32)
Calcium: 8.6 mg/dL — ABNORMAL LOW (ref 8.9–10.3)
Chloride: 101 mmol/L (ref 98–111)
Creatinine, Ser: 2.07 mg/dL — ABNORMAL HIGH (ref 0.61–1.24)
GFR, Estimated: 30 mL/min — ABNORMAL LOW (ref >60)
Glucose, Bld: 151 mg/dL — ABNORMAL HIGH (ref 70–99)
Potassium: 4.7 mmol/L (ref 3.5–5.1)
Sodium: 133 mmol/L — ABNORMAL LOW (ref 135–145)

## 2023-03-24 LAB — MAGNESIUM: Magnesium: 2 mg/dL (ref 1.7–2.4)

## 2023-03-24 MED ORDER — HYALURONIDASE HUMAN 150 UNIT/ML IJ SOLN
150.0000 [IU] | Freq: Once | INTRAMUSCULAR | Status: AC
  Administered 2023-03-24: 150 [IU] via SUBCUTANEOUS
  Filled 2023-03-24: qty 1

## 2023-03-24 NOTE — Progress Notes (Signed)
   Rounding Note    Patient Name: [name] Date of Encounter: 03/24/2023   [facility] Cardiologist: Armanda Magic, MD   Subjective   More VT yesterday evening treated successfully with ATP.  Vital Signs    Vitals:   03/24/23 0200 03/24/23 0300 03/24/23 0400 03/24/23 0411  BP: 119/67 113/61 126/70   Pulse: 75 83 78   Resp: 15 13 16    Temp:    98.1 F (36.7 C)  TempSrc:    Oral  SpO2: 98% 96% 97%   Weight:      Height:        Intake/Output Summary (Last 24 hours) at 03/24/2023 0808 Last data filed at 03/23/2023 2200 Gross per 24 hour  Intake 693.24 ml  Output 900 ml  Net -206.76 ml      03/20/2023    6:00 AM 03/19/2023   10:43 AM 03/19/2023    8:07 AM  Last 3 Weights  Weight (lbs) 194 lb 0.1 oz 189 lb 197 lb  Weight (kg) 88 kg 85.73 kg 89.359 kg      Telemetry    Personally Reviewed  ECG    Personally Reviewed  Physical Exam   GEN: No acute distress.   Cardiac: RRR, no murmurs, rubs, or gallops.  Respiratory: Clear to auscultation bilaterally. Psych: Normal affect   Assessment & Plan    #VT Storm #ICM #HFrEF cont amio gtt Cont coreg Cont mexiletine Not a candidate for invasive EP procedures.   Not a candidate for Ranexa given prior severe reaction to the medication.  This was again confirmed with the patient 12/15 AM. ICD has been reprogrammed to provide earlier therapy.  I am concerned that we may be heading towards palliative/hospice care if no significant improvement in rhythm control over next 24 hours.  [name], MD, [facility], [facility] Cardiac Electrophysiology

## 2023-03-25 ENCOUNTER — Other Ambulatory Visit: Payer: Self-pay

## 2023-03-25 DIAGNOSIS — I472 Ventricular tachycardia, unspecified: Secondary | ICD-10-CM | POA: Diagnosis not present

## 2023-03-25 LAB — BASIC METABOLIC PANEL
Anion gap: 12 (ref 5–15)
BUN: 36 mg/dL — ABNORMAL HIGH (ref 8–23)
CO2: 19 mmol/L — ABNORMAL LOW (ref 22–32)
Calcium: 7.6 mg/dL — ABNORMAL LOW (ref 8.9–10.3)
Chloride: 98 mmol/L (ref 98–111)
Creatinine, Ser: 2.13 mg/dL — ABNORMAL HIGH (ref 0.61–1.24)
GFR, Estimated: 29 mL/min — ABNORMAL LOW (ref >60)
Glucose, Bld: 354 mg/dL — ABNORMAL HIGH (ref 70–99)
Potassium: 3.8 mmol/L (ref 3.5–5.1)
Sodium: 129 mmol/L — ABNORMAL LOW (ref 135–145)

## 2023-03-25 LAB — MAGNESIUM: Magnesium: 2 mg/dL (ref 1.7–2.4)

## 2023-03-25 MED ORDER — SODIUM CHLORIDE 0.9% FLUSH
10.0000 mL | Freq: Two times a day (BID) | INTRAVENOUS | Status: DC
  Administered 2023-03-25: 20 mL
  Administered 2023-03-26: 10 mL
  Administered 2023-03-26: 20 mL
  Administered 2023-03-27 – 2023-03-28 (×3): 10 mL
  Administered 2023-03-28: 20 mL
  Administered 2023-03-29 – 2023-04-02 (×9): 10 mL

## 2023-03-25 MED ORDER — SODIUM CHLORIDE 0.9% FLUSH: 10.0000 mL | INTRAVENOUS | Status: DC | PRN

## 2023-03-25 MED ORDER — CHLORHEXIDINE GLUCONATE CLOTH 2 % EX PADS
6.0000 | MEDICATED_PAD | Freq: Every day | CUTANEOUS | Status: DC
  Administered 2023-03-25 – 2023-04-02 (×9): 6 via TOPICAL

## 2023-03-25 NOTE — Progress Notes (Signed)
Physical Therapy Treatment Patient Details Name: [name] MRN: [id number] DOB: 08-26-1932 Today's Date: 03/25/2023   History of Present Illness [name] is an 89 y.o. male admitted 12/10 with intermittent episodes of weakness, legs going out, worsening SOB, and ICD shock due to VT. PMH: HFrEF, CAD, ICM s/p CRT-D, AS s/p bioprosthetic AVR, perm AFib, CKD stage 4, hyperthyroidism, HTN, HLD    PT Comments  Pt deconditioned with decreased ambulation tolerance compared to initial eval last week. Pt's c/o is SOB, "I can't catch my wind." Pt needs RW for safe ambulation at this time. Acute PT to cont to follow.    If plan is discharge home, recommend the following: A little help with walking and/or transfers;A little help with bathing/dressing/bathroom;Assist for transportation;Help with stairs or ramp for entrance   Can travel by private vehicle        Equipment Recommendations  None recommended by PT    Recommendations for Other Services       Precautions / Restrictions Precautions Precautions: Fall Precaution Comments: watch SOB Restrictions Weight Bearing Restrictions Per Provider Order: No     Mobility  Bed Mobility Overal bed mobility: Needs Assistance Bed Mobility: Supine to Sit     Supine to sit: Contact guard, Used rails     General bed mobility comments: increased time, use of bed rail, tactile cueing for trunk elevation    Transfers Overall transfer level: Needs assistance Equipment used: Rolling walker (2 wheels) Transfers: Sit to/from Stand Sit to Stand: Contact guard assist, Min assist           General transfer comment: contact guard for STS from EOB, minA for STS from chair due to lower surface height    Ambulation/Gait Ambulation/Gait assistance: Min assist Gait Distance (Feet): 160 Feet Assistive device: Rolling walker (2 wheels) Gait Pattern/deviations: Step-through pattern, Trunk flexed Gait velocity: dec Gait velocity interpretation:  <1.31 ft/sec, indicative of household ambulator   General Gait Details: pt required 2 standing rest breaks due to SOB, pt also with report of dizziness. systolic bp dropped from 114 to 103 which could contribute to dizziness. pt reports 3/4 DOE, minA at time for walker management   Stairs             Wheelchair Mobility     Tilt Bed    Modified Rankin (Stroke Patients Only)       Balance Overall balance assessment: Needs assistance Sitting-balance support: Feet supported, No upper extremity supported, Bilateral upper extremity supported Sitting balance-Leahy Scale: Good     Standing balance support: Bilateral upper extremity supported, During functional activity Standing balance-Leahy Scale: Fair Standing balance comment: use of RW                            Cognition Arousal: Alert Behavior During Therapy: WFL for tasks assessed/performed Overall Cognitive Status: Within Functional Limits for tasks assessed                                 General Comments: HOH which causes some delay responses        Exercises      General Comments General comments (skin integrity, edema, etc.): SpO2 > 90% on RA, mild orthostatic BP during amb      Pertinent Vitals/Pain Pain Assessment Pain Assessment: No/denies pain    Home Living  Prior Function            PT Goals (current goals can now be found in the care plan section) Acute Rehab PT Goals Patient Stated Goal: improve breathing PT Goal Formulation: With patient Time For Goal Achievement: 04/03/23 Potential to Achieve Goals: Good Progress towards PT goals: Progressing toward goals    Frequency    Min 1X/week      PT Plan      Co-evaluation              AM-PAC PT "6 Clicks" Mobility   Outcome Measure  Help needed turning from your back to your side while in a flat bed without using bedrails?: A Little Help needed moving from lying  on your back to sitting on the side of a flat bed without using bedrails?: A Little Help needed moving to and from a bed to a chair (including a wheelchair)?: A Little Help needed standing up from a chair using your arms (e.g., wheelchair or bedside chair)?: A Little Help needed to walk in hospital room?: A Little Help needed climbing 3-5 steps with a railing? : A Lot 6 Click Score: 17    End of Session Equipment Utilized During Treatment: Gait belt Activity Tolerance: Patient tolerated treatment well Patient left: in chair;with call bell/phone within reach;with chair alarm set;with family/visitor present Nurse Communication: Mobility status PT Visit Diagnosis: Unsteadiness on feet (R26.81)     Time: 0950-1020 PT Time Calculation (min) (ACUTE ONLY): 30 min  Charges:    $Gait Training: 23-37 mins PT General Charges $$ ACUTE PT VISIT: 1 Visit                     Lewis Shock, PT, DPT Acute Rehabilitation Services Secure chat preferred Office #: [phone]    Iona Hansen 03/25/2023, 1:00 PM

## 2023-03-25 NOTE — Progress Notes (Signed)
    Subjective    CTSP 2/2 R wrist IV infiltration.  The R FA is swollen, tender, and mildly erythematous.  Pt w/ multiple ecchymotic areas in the setting of freq venous sticks and other IV insertions.  He is currently in no distress.  Objective    Vitals:   03/25/23 1600 03/25/23 1700  BP: 101/86 90/62  Pulse: 83 76  Resp: (!) 24 19  Temp:    SpO2: 96% 98%   Pleasant, NAD, AAOx3.  R forearm swollen and mildly erythematous.  Tender to touch.  No skin breakdown.  R RA 2+.  Lab Results  Component Value Date   WBC 10.3 03/20/2023   HGB 12.9 (L) 03/20/2023   HCT 40.3 03/20/2023   MCV 91.8 03/20/2023   PLT 253 03/20/2023    Lab Results  Component Value Date   CREATININE 2.13 (H) 03/25/2023   BUN 36 (H) 03/25/2023   NA 129 (L) 03/25/2023   K 3.8 03/25/2023   CL 98 03/25/2023   CO2 19 (L) 03/25/2023   Recent Labs  Lab 03/15/23 2020 03/15/23 2200 03/19/23 1052 03/19/23 1245  TROPONINIHS 16 11 21* 20*     BNP    Component Value Date/Time   BNP 313.2 (H) 11/14/2018 1523    ProBNP    Component Value Date/Time   PROBNP 23,607 (H) 06/14/2017 1234     Assessment & Plan    1. Infiltrated IV:  in setting of amio therapy.  Previously infiltrated in L forearm, now infiltrated on R.  Area is mildly erythematous, swollen, and tender.  Apply warm compress.  Will ask IV team to assess for PICC given VT requiring ongoing Amio therapy. Creat 2.13 - no plans for HD.  Nicolasa Ducking, NP 03/25/2023, 6:30 PM

## 2023-03-25 NOTE — Progress Notes (Addendum)
Patient Name: [name] Date of Encounter: 03/25/2023  Primary Cardiologist: Armanda Magic, MD Electrophysiologist: Regan Lemming, MD  Interval Summary   Pt denies acute complaints. He is hopeful to go home soon. Daughter at bedside.   Vital Signs    Vitals:   03/25/23 0300 03/25/23 0400 03/25/23 0500 03/25/23 0600  BP: (!) 109/54 (!) 113/59 107/71 131/72  Pulse: 81 83 84 76  Resp: 17 15 14 16   Temp:   97.9 F (36.6 C)   TempSrc:   Oral   SpO2: 96% 97% 97% 96%  Weight:      Height:        Intake/Output Summary (Last 24 hours) at 03/25/2023 0659 Last data filed at 03/25/2023 0540 Gross per 24 hour  Intake 1526.7 ml  Output 950 ml  Net 576.7 ml   Filed Weights   03/19/23 1043 03/20/23 0600  Weight: 85.7 kg 88 kg    Physical Exam    GEN- elderly adult male lying in bed in NAD, alert and oriented x 3 today.   Lungs- Clear to ausculation bilaterally, normal work of breathing, on RA  Cardiac-  Regular  rate and rhythm - VP 70's, no murmurs, rubs or gallops GI- soft, NT, ND, + BS Extremities- no clubbing or cyanosis. No edema  Telemetry    VP 70's, episodes of VT requiring ATP 12/15 around 2230, last NSVT 12/16 0030 (personally reviewed)  Hospital Course    [name] is a 89 y.o. male with PMH of HFrEF, CAD, ICM s/p CRT-D, AS s/p bioprosthetic AVR, perm AFib, CKD stage 4, hyperthyroid, HTN, HLD admitted for VT storm.  IV amiodarone load > persistent episodes of VT.  Assessment & Plan    VT Storm  ICM  HFrEF  -continue amiodarone IV 60 mg/hr -continue coreg -continue mexiletine   -not a candidate for Ranexa given prior severe reaction to medication  -ICD / VT zone reprogrammed this admit to provide earlier therapy  -tele review with last VT 12/15, requiring ATP.   -goal K+>4, Mg+ >2   Permanent AF  -Eliquis 2.5 mg BID   HFrEF  HTN  CAD -continue coreg  -no anginal symptoms  -ASA, imdur   VHD s/p Bioprosthetic AVR  AVR  functioning well per 2021 TTE   CKD  -Trend BMP / urinary output -Replace electrolytes as indicated -Avoid nephrotoxic agents, ensure adequate renal perfusion   For questions or updates, please contact [facility] Please consult [email or web address] for contact info under Cardiology/STEMI.  Signed, Canary Brim, MSN, APRN, NP-C, AGACNP-BC [facility] - Electrophysiology  03/25/2023, 7:21 AM  I have seen and examined this patient with Canary Brim.  Agree with above, note added to reflect my findings.  Patient with VT overnight last night requiring ATP therapy.  He was asymptomatic.  GEN: Well nourished, well developed, in no acute distress  HEENT: normal  Neck: no JVD, carotid bruits, or masses Cardiac: RRR; no murmurs, rubs, or gallops,no edema  Respiratory:  clear to auscultation bilaterally, normal work of breathing GI: soft, nontender, nondistended, + BS MS: no deformity or atrophy  Skin: warm and dry, device site well healed Neuro:  Strength and sensation are intact Psych: euthymic mood, full affect   VT storm: Patient continuing to have episodes of ventricular tachycardia.  This is despite high-dose amiodarone for multiple days and mexiletine.  My concern is that he [name] continue to have VT despite these therapies.  Additionally, he has not tolerated VT  well in the past.  He would be a "poor candidate for ablation due to kidney disease, overall frailty, and advanced age.  [name] continue with amiodarone for now.  I did have discussions with the family about possibility of palliative care consultation.  They have agreed to this. Permanent atrial fibrillation: Continue Eliquis Chronic systolic heart failure: No obvious volume overload. Post AVR: Functioning well on most recent echo CKD stage IIIb-IV: Stable  [name] M. Jory Welke MD 03/25/2023 9:53 AM

## 2023-03-25 NOTE — Inpatient Diabetes Management (Signed)
Inpatient Diabetes Program Recommendations  AACE/ADA: New Consensus Statement on Inpatient Glycemic Control (2015)  Target Ranges:  Prepandial:   less than 140 mg/dL      Peak postprandial:   less than 180 mg/dL (1-2 hours)      Critically ill patients:  140 - 180 mg/dL   Lab Results  Component Value Date   GLUCAP 88 03/19/2023   HGBA1C 6.9 08/13/2017    Review of Glycemic Control  Latest Reference Range & Units 03/21/23 02:24 03/22/23 03:25 03/23/23 03:03 03/24/23 05:05 03/25/23 03:20  Glucose 70 - 99 mg/dL 696 (H) 295 (H) 284 (H) 151 (H) 354 (H)   Diabetes history: None  Current orders for Inpatient glycemic control:  None  Note: Glucose trends increased, if in the plan of care may consider:  -   CBGs and Novolog 0-6 units tid + hs  Thanks,  Christena Deem RN, MSN, BC-ADM Inpatient Diabetes Coordinator Team Pager [phone] (8a-5p)

## 2023-03-25 NOTE — Progress Notes (Signed)
Peripherally Inserted Central Catheter Placement  The IV Nurse has discussed with the patient and/or persons authorized to consent for the patient, the purpose of this procedure and the potential benefits and risks involved with this procedure.  The benefits include less needle sticks, lab draws from the catheter, and the patient may be discharged home with the catheter. Risks include, but not limited to, infection, bleeding, blood clot (thrombus formation), and puncture of an artery; nerve damage and irregular heartbeat and possibility to perform a PICC exchange if needed/ordered by physician.  Alternatives to this procedure were also discussed.  Bard Power PICC patient education guide, fact sheet on infection prevention and patient information card has been provided to patient /or left at bedside.    PICC Placement Documentation  PICC Double Lumen 03/25/23 Right Basilic 42 cm 0 cm (Active)  Indication for Insertion or Continuance of Line Prolonged intravenous therapies;Limited venous access - need for IV therapy >5 days (PICC only) 03/25/23 2142  Exposed Catheter (cm) 0 cm 03/25/23 2142  Site Assessment Clean, Dry, Intact 03/25/23 2142  Lumen #1 Status Flushed;Saline locked;Blood return noted 03/25/23 2142  Lumen #2 Status Flushed;Saline locked;Blood return noted 03/25/23 2142  Dressing Type Transparent;Securing device 03/25/23 2142  Dressing Status Antimicrobial disc in place 03/25/23 2142  Line Care Connections checked and tightened 03/25/23 2142  Line Adjustment (NICU/IV Team Only) No 03/25/23 2142  Dressing Intervention New dressing;Adhesive placed at insertion site (IV team only) 03/25/23 2142  Dressing Change Due 04/01/23 03/25/23 2142       Elenore Paddy 03/25/2023, 9:44 PM

## 2023-03-26 DIAGNOSIS — I472 Ventricular tachycardia, unspecified: Secondary | ICD-10-CM | POA: Diagnosis not present

## 2023-03-26 LAB — BASIC METABOLIC PANEL
Anion gap: 9 (ref 5–15)
BUN: 37 mg/dL — ABNORMAL HIGH (ref 8–23)
CO2: 22 mmol/L (ref 22–32)
Calcium: 8.2 mg/dL — ABNORMAL LOW (ref 8.9–10.3)
Chloride: 103 mmol/L (ref 98–111)
Creatinine, Ser: 2.08 mg/dL — ABNORMAL HIGH (ref 0.61–1.24)
GFR, Estimated: 30 mL/min — ABNORMAL LOW (ref >60)
Glucose, Bld: 135 mg/dL — ABNORMAL HIGH (ref 70–99)
Potassium: 4 mmol/L (ref 3.5–5.1)
Sodium: 134 mmol/L — ABNORMAL LOW (ref 135–145)

## 2023-03-26 LAB — MAGNESIUM: Magnesium: 2.2 mg/dL (ref 1.7–2.4)

## 2023-03-26 MED ORDER — POLYETHYLENE GLYCOL 3350 17 G PO PACK
17.0000 g | PACK | Freq: Every day | ORAL | Status: DC
Start: 2023-03-26 — End: 2023-04-02
  Administered 2023-03-26 – 2023-04-02 (×4): 17 g via ORAL
  Filled 2023-03-26 (×5): qty 1

## 2023-03-26 MED ORDER — SENNOSIDES-DOCUSATE SODIUM 8.6-50 MG PO TABS
1.0000 | ORAL_TABLET | Freq: Every day | ORAL | Status: DC
  Administered 2023-03-27 – 2023-04-02 (×5): 1 via ORAL
  Filled 2023-03-26 (×6): qty 1

## 2023-03-26 NOTE — Progress Notes (Addendum)
Patient Name: [name] Date of Encounter: 03/26/2023  Primary Cardiologist: Armanda Magic, MD Electrophysiologist: Regan Lemming, MD  Interval Summary   Patient reports he feels bad today. Choked on his food.  At this time, the patient denies chest pain, shortness of breath, or any new concerns.  Vital Signs    Vitals:   03/26/23 0300 03/26/23 0400 03/26/23 0500 03/26/23 0600  BP: 105/63 118/67 130/77 121/73  Pulse: 82 83 82 78  Resp: 13 20 18 18   Temp:      TempSrc:      SpO2: 98% 96% 98% 98%  Weight:      Height:        Intake/Output Summary (Last 24 hours) at 03/26/2023 0722 Last data filed at 03/26/2023 2951 Gross per 24 hour  Intake 808.76 ml  Output 2125 ml  Net -1316.24 ml   Filed Weights   03/19/23 1043 03/20/23 0600  Weight: 85.7 kg 88 kg    Physical Exam    GEN- chronically ill appearing elderly male in NAD, alert and oriented x 3 today.   Lungs- Clear to ausculation bilaterally, normal work of breathing Cardiac- Regular rate and rhythm, no murmurs, rubs or gallops GI- soft, NT, ND, + BS Extremities- no clubbing or cyanosis. No edema  Telemetry    VP 70-80's, frequent NSVT, VT at rates of 130's (below therapy zone), no ATP overnight (personally reviewed)  Hospital Course    TORRIS MATSUOKA is a 89 y.o. male PMH of HFrEF, CAD, ICM s/p CRT-D, AS s/p bioprosthetic AVR, perm AFib, CKD stage 4, hyperthyroid, HTN, HLD admitted for VT storm.  IV amiodarone load > persistent episodes of VT.   Assessment & Plan    VT Storm  ICM  HFrEF  -continue amiodarone at 60mg /hr -coreg, mexiletine  -not a candidate for Ranexa due to prior severe reaction to medication  -follow electrolytes closely, goal K+>4, Mg+ >2 -Kamrin Spath review VT zones and LRL with industry  -VT rate has slowed to 130's, below VT zones   Permanent AF  -eliquis 2.5mg  BID    HFrEF  HTN  CAD -coreg, ASA, imdur    VHD s/p Bioprosthetic AVR  AVR functioning well per 2021 TTE     CKD  -Trend BMP / urinary output -Replace electrolytes as indicated -Avoid nephrotoxic agents, ensure adequate renal perfusion    For questions or updates, please contact [facility] Please consult [email or web address] for contact info under Cardiology/STEMI.  Signed, Canary Brim, MSN, APRN, NP-C, AGACNP-BC [facility] - Electrophysiology  03/26/2023, 7:23 AM   I have seen and examined this patient with Canary Brim.  Agree with above, note added to reflect my findings.  Continue episodes of VT overnight, though no ATP.  No acute complaints.  GEN: Well nourished, well developed, in no acute distress  HEENT: normal  Neck: no JVD, carotid bruits, or masses Cardiac: RRR; no murmurs, rubs, or gallops,no edema  Respiratory:  clear to auscultation bilaterally, normal work of breathing GI: soft, nontender, nondistended, + BS MS: no deformity or atrophy  Skin: warm and dry, device site well healed Neuro:  Strength and sensation are intact Psych: euthymic mood, full affect   VT storm: Has continued episodes of ventricular tachycardia.  Rates have slowed likely due to amiodarone.  Dabney Dever need to review his VT zones to ensure that he is properly being covered.  Would continue amiodarone at the current dose. Permanent atrial fibrillation: Continue Eliquis Chronic systolic heart failure: Continue home  medications.  No obvious volume overload Coronary artery disease: No volume overload CKD stage IV: Has been remained stable.  Caisen Mangas M. Ashleah Valtierra MD 03/26/2023 9:30 AM

## 2023-03-26 NOTE — Evaluation (Signed)
Occupational Therapy Evaluation Patient Details Name: [name] MRN: [id number] DOB: Feb 24, 1933 Today's Date: 03/26/2023   History of Present Illness [name] is an 89 y.o. male admitted 12/10 with intermittent episodes of weakness, legs going out, worsening SOB, and ICD shock due to VT. PMH: HFrEF, CAD, ICM s/p CRT-D, AS s/p bioprosthetic AVR, perm AFib, CKD stage 4, hyperthyroidism, HTN, HLD   Clinical Impression   PTA, pt lived with his wife and was mod I for ADL, cooking, and short distance driving. Upon eval, pt needing CGA for LB and OOB ADL to optimize safety. Pt with decreased activity tolerance and generalized weakness. Does have some STM difficulty, but reports this is baseline. Will follow acutely and recommending discharge home with HHOT.       If plan is discharge home, recommend the following: A little help with walking and/or transfers;A little help with bathing/dressing/bathroom;Assistance with cooking/housework;Assist for transportation;Help with stairs or ramp for entrance    Functional Status Assessment  Patient has had a recent decline in their functional status and demonstrates the ability to make significant improvements in function in a reasonable and predictable amount of time.  Equipment Recommendations  BSC/3in1    Recommendations for Other Services       Precautions / Restrictions Precautions Precautions: Fall Precaution Comments: watch SOB Restrictions Weight Bearing Restrictions Per Provider Order: No      Mobility Bed Mobility Overal bed mobility: Needs Assistance Bed Mobility: Supine to Sit     Supine to sit: Contact guard, Used rails     General bed mobility comments: increased time, use of bed rail; one rest break before scooting hips out toward EOB    Transfers Overall transfer level: Needs assistance Equipment used: Rolling walker (2 wheels) Transfers: Sit to/from Stand Sit to Stand: Contact guard assist           General  transfer comment: for safety      Balance Overall balance assessment: Needs assistance Sitting-balance support: Feet supported, No upper extremity supported, Bilateral upper extremity supported Sitting balance-Leahy Scale: Good     Standing balance support: Bilateral upper extremity supported, During functional activity Standing balance-Leahy Scale: Fair Standing balance comment: use of RW                           ADL either performed or assessed with clinical judgement   ADL Overall ADL's : Needs assistance/impaired Eating/Feeding: Set up;Sitting   Grooming: Oral care;Contact guard assist;Standing   Upper Body Bathing: Set up;Sitting   Lower Body Bathing: Contact guard assist;Sit to/from stand   Upper Body Dressing : Set up;Sitting   Lower Body Dressing: Contact guard assist;Sit to/from stand   Toilet Transfer: Contact guard assist;BSC/3in1;Rolling walker (2 wheels);Ambulation           Functional mobility during ADLs: Contact guard assist;Rolling walker (2 wheels)       Vision Ability to See in Adequate Light: 0 Adequate Patient Visual Report: No change from baseline Vision Assessment?: No apparent visual deficits Additional Comments: WFL for tasks assessed; able to read signage on wall     Perception Perception: Not tested       Praxis Praxis: Not tested       Pertinent Vitals/Pain Pain Assessment Pain Assessment: No/denies pain     Extremity/Trunk Assessment Upper Extremity Assessment Upper Extremity Assessment: Generalized weakness (4+/5 MMT)   Lower Extremity Assessment Lower Extremity Assessment: Defer to PT evaluation   Cervical / Trunk Assessment  Cervical / Trunk Assessment: Kyphotic   Communication Communication Communication: Hearing impairment   Cognition Arousal: Alert Behavior During Therapy: WFL for tasks assessed/performed Overall Cognitive Status: Within Functional Limits for tasks assessed                                  General Comments: HOH which causes some delayed responses. Some STM difficulty but reports this is baseline.     General Comments  SpO2>90 HR in 90s    Exercises     Shoulder Instructions      Home Living Family/patient expects to be discharged to:: Private residence Living Arrangements: Spouse/significant other Available Help at Discharge: Family;Available 24 hours/day (wife, but does have her own health obstacles.) Type of Home: House Home Access: Ramped entrance     Home Layout: One level     Bathroom Shower/Tub: Producer, television/film/video: Handicapped height     Home Equipment: Rollator (4 wheels)          Prior Functioning/Environment Prior Level of Function : Independent/Modified Independent             Mobility Comments: uses rollator when out in community and intermittently at home, drives ADLs Comments: reports sponge bathing, reports doing the cooking        OT Problem List: Decreased strength;Decreased activity tolerance;Impaired balance (sitting and/or standing);Decreased safety awareness;Decreased knowledge of use of DME or AE      OT Treatment/Interventions: Self-care/ADL training;Therapeutic exercise;DME and/or AE instruction;Balance training;Patient/family education;Therapeutic activities    OT Goals(Current goals can be found in the care plan section) Acute Rehab OT Goals Patient Stated Goal: go home OT Goal Formulation: With patient Time For Goal Achievement: 04/09/23 Potential to Achieve Goals: Good  OT Frequency: Min 1X/week    Co-evaluation              AM-PAC OT "6 Clicks" Daily Activity     Outcome Measure Help from another person eating meals?: A Little Help from another person taking care of personal grooming?: A Little Help from another person toileting, which includes using toliet, bedpan, or urinal?: A Little Help from another person bathing (including washing, rinsing, drying)?: A  Little Help from another person to put on and taking off regular upper body clothing?: A Little Help from another person to put on and taking off regular lower body clothing?: A Little 6 Click Score: 18   End of Session Equipment Utilized During Treatment: Gait belt;Rolling walker (2 wheels) Nurse Communication: Mobility status  Activity Tolerance: Patient tolerated treatment well Patient left: in chair;with call bell/phone within reach;with nursing/sitter in room (RN in room)  OT Visit Diagnosis: Unsteadiness on feet (R26.81);Muscle weakness (generalized) (M62.81)                Time: 0865-7846 OT Time Calculation (min): 30 min Charges:  OT General Charges $OT Visit: 1 Visit OT Evaluation $OT Eval Moderate Complexity: 1 Mod OT Treatments $Self Care/Home Management : 8-22 mins  [name], OTR/L [facility] Acute Rehabilitation Office: [phone]   Myrla Halsted 03/26/2023, 10:51 AM

## 2023-03-26 NOTE — TOC Initial Note (Signed)
Transition of Care [facility]) - Initial/Assessment Note    Patient Details  Name: [name] MRN: [id number] Date of Birth: Aug 28, 1932  Transition of Care [facility]) CM/SW Contact:    Elliot Cousin, RN Phone Number: [phone] 03/26/2023, 4:02 PM  Clinical Narrative:                 CM spoke to pt and gave permission to speak dtr, [name]. Dtr agreeable to [facility] for HH. (Medicare.gov list placed on chart and provided to pt). Pt has RW and cane at home. Dtr states they will provide transportation home.  Will need HH RN, and HH PT orders with [facility].   Expected Discharge Plan: Home w Home Health Services Barriers to Discharge: Continued Medical Work up   Patient Goals and CMS Choice Patient states their goals for this hospitalization and ongoing recovery are:: wants to get better CMS Medicare.gov Compare Post Acute Care list provided to:: Patient Choice offered to / list presented to : Patient      Expected Discharge Plan and Services   Discharge Planning Services: CM Consult Post Acute Care Choice: Home Health Living arrangements for the past 2 months: Single Family Home                           HH Arranged: RN, PT [facility] Agency: [facility] Date [facility] Agency Contacted: 03/26/23 Time HH Agency Contacted: 1602 Representative spoke with at [facility] Agency: Hassel Neth  Prior Living Arrangements/Services Living arrangements for the past 2 months: Single Family Home Lives with:: Spouse Patient language and need for interpreter reviewed:: Yes Do you feel safe going back to the place where you live?: Yes      Need for Family Participation in Patient Care: Yes (Comment) Care giver support system in place?: Yes (comment) Current home services: DME (rolling walker) Criminal Activity/Legal Involvement Pertinent to Current Situation/Hospitalization: No - Comment as needed  Activities of Daily Living   ADL Screening (condition at time of admission) Independently  performs ADLs?: Yes (appropriate for developmental age) Is the patient deaf or have difficulty hearing?: Yes Does the patient have difficulty seeing, even when wearing glasses/contacts?: No Does the patient have difficulty concentrating, remembering, or making decisions?: No  Permission Sought/Granted Permission sought to share information with : Case Manager, Family Supports, PCP Permission granted to share information with : Yes, Verbal Permission Granted  Share Information with NAME: Jahsier Rittenour     Permission granted to share info w Relationship: daughter  Permission granted to share info w Contact Information: [phone]  Emotional Assessment Appearance:: Appears stated age Attitude/Demeanor/Rapport: Engaged Affect (typically observed): Accepting     Psych Involvement: No (comment)  Admission diagnosis:  Ventricular tachycardia (HCC) [I47.20] VT (ventricular tachycardia) (HCC) [I47.20] Patient Active Problem List   Diagnosis Date Noted   Hypercoagulable state due to persistent atrial fibrillation (HCC) 07/19/2022   Orthostatic hypotension 09/11/2019   Ileus (HCC) 09/04/2019   Hyponatremia 09/03/2019   Metabolic acidosis 09/03/2019   Diverticulitis 09/02/2019   Acute urinary retention 09/02/2019   Lower extremity weakness 11/29/2018   Multiple thyroid nodules 08/13/2017   DCM (dilated cardiomyopathy) (HCC) 06/16/2017   History of aortic valve replacement with bioprosthetic valve    Cardiac resynchronization therapy defibrillator (CRT-D) in place    Protein-calorie malnutrition, severe 05/27/2017   Hyperthyroidism 05/26/2017   Cough 05/26/2017   Pleural effusion on right 05/26/2017   Malnutrition of moderate degree 05/09/2017   GI  bleed 05/08/2017   VT (ventricular tachycardia) (HCC) 05/06/2017   Dizziness 04/23/2017   Weight loss 04/23/2017   Leg weakness 01/28/2017   Chronic combined systolic (congestive) and diastolic (congestive) heart failure (HCC)     Shingles 08/23/2014   Stage 4 chronic kidney disease (HCC) 08/23/2014   Hyperkalemia 08/23/2014   Essential hypertension 01/23/2013   Dyslipidemia 01/23/2013   Coronary artery disease 01/23/2013   Persistent atrial fibrillation (HCC) 01/23/2013   Chronic anticoagulation 01/23/2013   PCP:  Joycelyn Rua, MD Pharmacy:   [facility] [phone] - [place], [place] - [address] [place] [address] Phone: [phone] Fax: [phone]  [facility] - [place], [place] - [address] [place] [address] Phone: [phone] Fax: [phone]     Social Drivers of Health (SDOH) Social History: SDOH Screenings   Food Insecurity: No Food Insecurity (03/19/2023)  Housing: Low Risk  (03/21/2023)  Transportation Needs: No Transportation Needs (03/19/2023)  Utilities: Not At Risk (03/19/2023)  Tobacco Use: Low Risk  (03/19/2023)   SDOH Interventions: Housing Interventions: Intervention Not Indicated   Readmission Risk Interventions     No data to display

## 2023-03-27 DIAGNOSIS — Z515 Encounter for palliative care: Secondary | ICD-10-CM

## 2023-03-27 DIAGNOSIS — I472 Ventricular tachycardia, unspecified: Secondary | ICD-10-CM | POA: Diagnosis not present

## 2023-03-27 DIAGNOSIS — Z7189 Other specified counseling: Secondary | ICD-10-CM | POA: Diagnosis not present

## 2023-03-27 LAB — MAGNESIUM: Magnesium: 2.2 mg/dL (ref 1.7–2.4)

## 2023-03-27 LAB — BASIC METABOLIC PANEL
Anion gap: 9 (ref 5–15)
BUN: 39 mg/dL — ABNORMAL HIGH (ref 8–23)
CO2: 23 mmol/L (ref 22–32)
Calcium: 8.2 mg/dL — ABNORMAL LOW (ref 8.9–10.3)
Chloride: 102 mmol/L (ref 98–111)
Creatinine, Ser: 2.02 mg/dL — ABNORMAL HIGH (ref 0.61–1.24)
GFR, Estimated: 31 mL/min — ABNORMAL LOW (ref >60)
Glucose, Bld: 145 mg/dL — ABNORMAL HIGH (ref 70–99)
Potassium: 4.4 mmol/L (ref 3.5–5.1)
Sodium: 134 mmol/L — ABNORMAL LOW (ref 135–145)

## 2023-03-27 NOTE — Progress Notes (Signed)
Physical Therapy Treatment Patient Details Name: [name] MRN: [id number] DOB: 1932-07-01 Today's Date: 03/27/2023   History of Present Illness Danielle is an 89 y.o. male admitted 12/10 with intermittent episodes of weakness, legs going out, worsening SOB, and ICD shock due to VT. PMH: HFrEF, CAD, ICM s/p CRT-D, AS s/p bioprosthetic AVR, perm AFib, CKD stage 4, hyperthyroidism, HTN, HLD    PT Comments  Pt making steady progress with improved ambulation distance and activity tolerance. Continue to work toward increasing functional activity and independence so pt can return home.     If plan is discharge home, recommend the following: A little help with walking and/or transfers;A little help with bathing/dressing/bathroom;Assist for transportation;Help with stairs or ramp for entrance   Can travel by private vehicle        Equipment Recommendations  None recommended by PT    Recommendations for Other Services       Precautions / Restrictions Precautions Precautions: Fall Precaution Comments: watch SOB Restrictions Weight Bearing Restrictions Per Provider Order: No     Mobility  Bed Mobility Overal bed mobility: Needs Assistance Bed Mobility: Sit to Supine       Sit to supine: Contact guard assist, Used rails        Transfers Overall transfer level: Needs assistance Equipment used: Rollator (4 wheels) Transfers: Sit to/from Stand Sit to Stand: Contact guard assist           General transfer comment: for safety    Ambulation/Gait Ambulation/Gait assistance: Contact guard assist Gait Distance (Feet): 250 Feet Assistive device: Rollator (4 wheels) Gait Pattern/deviations: Step-through pattern, Decreased stride length, Trunk flexed Gait velocity: decr Gait velocity interpretation: 1.31 - 2.62 ft/sec, indicative of limited community ambulator   General Gait Details: Assist for safety and lines. Verbal cues to stand more erect and stay closer to  rollator   Stairs             Wheelchair Mobility     Tilt Bed    Modified Rankin (Stroke Patients Only)       Balance Overall balance assessment: Needs assistance Sitting-balance support: Feet supported, No upper extremity supported, Bilateral upper extremity supported Sitting balance-Leahy Scale: Good     Standing balance support: No upper extremity supported Standing balance-Leahy Scale: Fair                              Cognition Arousal: Alert Behavior During Therapy: WFL for tasks assessed/performed Overall Cognitive Status: Within Functional Limits for tasks assessed                                          Exercises      General Comments General comments (skin integrity, edema, etc.): VSS on RA. Dyspnea 2/4      Pertinent Vitals/Pain      Home Living                          Prior Function            PT Goals (current goals can now be found in the care plan section) Progress towards PT goals: Progressing toward goals    Frequency    Min 1X/week      PT Plan      Co-evaluation  AM-PAC PT "6 Clicks" Mobility   Outcome Measure  Help needed turning from your back to your side while in a flat bed without using bedrails?: None Help needed moving from lying on your back to sitting on the side of a flat bed without using bedrails?: A Little Help needed moving to and from a bed to a chair (including a wheelchair)?: A Little Help needed standing up from a chair using your arms (e.g., wheelchair or bedside chair)?: A Little Help needed to walk in hospital room?: A Little Help needed climbing 3-5 steps with a railing? : A Little 6 Click Score: 19    End of Session Equipment Utilized During Treatment: Gait belt Activity Tolerance: Patient tolerated treatment well Patient left: in bed;with call bell/phone within reach Nurse Communication: Mobility status PT Visit Diagnosis:  Unsteadiness on feet (R26.81);Muscle weakness (generalized) (M62.81)     Time: 8657-8469 PT Time Calculation (min) (ACUTE ONLY): 15 min  Charges:    $Gait Training: 8-22 mins PT General Charges $$ ACUTE PT VISIT: 1 Visit                     [facility] PT Acute Rehabilitation Services Office [phone]    Angelina Ok [facility] 03/27/2023, 11:06 AM

## 2023-03-27 NOTE — Consult Note (Cosign Needed Addendum)
Palliative Care Consult Note                                  Date: 03/27/2023   Patient Name: [name]  DOB: [birth year]  MRN: [id number]  Age / Sex: 89 y.o., male  PCP: Joycelyn Rua, MD Referring Physician: Regan Lemming, MD  Reason for Consultation: Establishing goals of care  HPI/Patient Profile: 89 y.o. male  with past medical history of HFrEF, CAD, ICM s/p CRT-D, AS s/p bioprosthetic AVR, perm AFib, CKD stage 4, hyperthyroid, HTN, HLD admitted for VT storm.  During hospitalization he has had persistent recurrent episodes of VT.  Palliative medicine was consulted for goals of care.  Past Medical History:  Diagnosis Date   Arthritis    "minor everywhere" (04/22/2015)   Asthma    "a touch"   Barrett esophagus    CAD (coronary artery disease)    a. s/p 3 vvessel CABG with LIMA to LAD, SVG to diagonal 1, SVG to RCA 12/09.   Cardiac resynchronization therapy defibrillator (CRT-D) in place    Chronic anticoagulation 01/23/2013   Chronic combined systolic and diastolic CHF (congestive heart failure) (HCC)    dry weight 197-200lbs.   CKD (chronic kidney disease) stage 3, GFR 30-59 ml/min (HCC) 08/23/2014   Complication of anesthesia    DCM (dilated cardiomyopathy) (HCC)    initially concerned for TTP amyloidosis but Tc-PYP scan 06/2017 was not consistent with amyloid and SPEP/UPEP with no M spike. EF 20-25% by echo 2019   Diverticulitis 08/2019   Dyslipidemia 01/23/2013   Essential hypertension 01/23/2013   GERD (gastroesophageal reflux disease)    GI bleed 05/08/2017   H/O: rheumatic fever    Hepatitis 1957   "don't know what kind:   History of hiatal hernia    History of PFTs    a. Amiodarone started 5/16 >> PFTs w/ DLCO 6/16:  FEV1 72% predicted, FEV1/FVC 66%, DLCO 66% >> minimal reversible obstructive airways disease with mild diffusion defect (suggestive of emphysema but absence of hyperinflation inconsistent  with dx)   Hyperkalemia 08/23/2014   Hyperthyroidism 05/26/2017   Leg weakness 01/28/2017   Multiple thyroid nodules 08/13/2017   Orthostatic hypotension    Persistent atrial fibrillation (HCC) 01/23/2013   Pleural effusion on right 05/26/2017   Pneumonia 08/2014   "dr thought I may have had a touch"   Pre-diabetes    Protein-calorie malnutrition, severe 05/27/2017   S/P AVR (aortic valve replacement) 2009   a. severe AS s/p AVR with pericardial tissue valve 2009.   Shingles 08/23/2014   VT (ventricular tachycardia) (HCC) 05/06/2017    Subjective:   This NP Wynne Dust reviewed medical records, received report from team, assessed the patient and then meet at the patient's bedside to discuss diagnosis, prognosis, GOC, EOL wishes disposition and options.  I met with the patient at the bedside.  About halfway through our conversation the patient's wife Jari Sportsman and son Molli Hazard entered the room.   We meet to discuss diagnosis prognosis, GOC, EOL wishes, disposition and options. Concept of Palliative Care was introduced as specialized medical care for people and their families living with serious illness.  If focuses on providing relief from the symptoms and stress of a serious illness.  The goal is to improve quality of life for both the patient and the family. Values and goals of care important to patient and family were attempted to be  elicited.  Created space and opportunity for patient  and family to explore thoughts and feelings regarding current medical situation   Natural trajectory and current clinical status were discussed. Questions and concerns addressed. Patient  encouraged to call with questions or concerns.    Patient/Family Understanding of Illness: He understands his heart rhythm is bad and they cannot get it under control.  He states that amiodarone at 1 point was killed him before so he is "treating a deadly condition with a deadly medication."  He does have a defibrillator in  corrects his rhythm (over paces) but he has not had a true defibrillation/shocks since Easter.  He also notes his kidneys are bad but have gotten a little bit better recently.  When his ICD does correct his rhythm he generally does not feel it.  He states he of the day they told him it occurred 10 times but he only felt it twice.  Life Review: Patient has been married for 60 years to his wife.  He has a daughter [name] and 2 sons, Molli Hazard and Mellody Dance.  His family all lives locally.  He is very close to his family.  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  He likes to fix equipment, work on his tractor, and gardening.  He previously had a big garden.  However, he has not been able to do some these things for the past several months.  He does mention intermittent fatigue recently.  Patient Values: Family, faith  Goals: He states "if my quality of life is as good as it has been now, I want to keep trying".  Today's Discussion: In addition to discussions described above we had extensive discussion of various topics.  We talked about options and the limitations that some options carry.  He places a lot of trust in his medical team stating "Dr. She is very capable and I trust what ever he feels is necessary."  We did discuss that there are limited options and he states "from what I am told there is really not much left to do."  He agrees that he has enjoyed his life and still drives.  He speaks of his family and tells me about their jobs and again it seems that he is very close with his family.  His daughter manages his medications.  His son is always trying to get him to eat better.  We discussed that sometimes the result of our goals of care conversations and evaluating our options are to continue doing what we can, enjoying her life as long as possible, excepting the risks that come with our current treatment, and making  decisions to change course down the road if our quality changes.  He seems to understand this.  At this point we have been talking for quite some time and I wanted to allow him time to spend with this.  I told him that I would reach out and speak with his cardiologist and we will plan a time to sit down and talk more.  Later in the day I attempted to call the patient's daughter and was unable to reach her.  I left a HIPAA appropriate voicemail for call back.  I provided emotional and general support through therapeutic listening, empathy, sharing of stories, and other techniques. I answered all questions and addressed all concerns to the best of my ability.  Review of Systems  Respiratory:  Negative for shortness  of breath.   Cardiovascular:  Negative for chest pain.  Gastrointestinal:  Negative for abdominal pain, nausea and vomiting.    Objective:   Primary Diagnoses: Present on Admission:  VT (ventricular tachycardia) (HCC)   Physical Exam Vitals and nursing note reviewed.  Constitutional:      General: He is not in acute distress.    Appearance: He is ill-appearing.  HENT:     Head: Normocephalic and atraumatic.  Cardiovascular:     Rate and Rhythm: Normal rate.     Comments: Paced rhythm Pulmonary:     Effort: Pulmonary effort is normal. No respiratory distress.  Abdominal:     General: Abdomen is flat.  Skin:    General: Skin is warm and dry.  Neurological:     General: No focal deficit present.     Mental Status: He is alert.  Psychiatric:        Mood and Affect: Mood normal.        Behavior: Behavior normal.     Vital Signs:  BP 99/71 (BP Location: Left Arm)   Pulse 83   Temp (!) 97.5 F (36.4 C) (Axillary)   Resp 20   Ht 6' (1.829 m)   Wt 88 kg   SpO2 97%   BMI 26.31 kg/m   Palliative Assessment/Data: 60%    Advanced Care Planning:   Existing Vynca/ACP Documentation: None  Primary Decision Maker: PATIENT  Code Status/Advance Care  Planning: Full code  A discussion was had today regarding advanced directives. Concepts specific to code status, artifical feeding and hydration, continued IV antibiotics and rehospitalization was had.  The difference between a aggressive medical intervention path and a palliative comfort care path for this patient at this time was had.   Decisions/Changes to ACP: None today  Assessment & Plan:   Impression: 89 year old male with acute presentation of chronic comorbidities as described above.  At this point the patient is doing quite a difficult situation.  He has refractory and recurrent VT despite amiodarone.  It appears the plan is to try to transition to oral amiodarone soon.  However, he will be at high risk for recurrent arrhythmia.  He does have a defibrillator which is generally able to correct his arrhythmia, but at some point it may not.  At this point it seems that he enjoys his life and is stated that if his quality of life can remain as good as it is now that he wants to keep trying treatments.  However, we discussed that at some point his quality of life may not be as good.  At this time it would be appropriate to we discuss and change course if desired.  He seems to be talking about wanting to go home with continued treatment and even possibly PT/OT at home.  Palliative medicine will continue to follow.  Overall long-term prognosis is poor.  SUMMARY OF RECOMMENDATIONS   Remain full code and full scope for now Time for outcomes Further discussion with cardiology team on options Ongoing GOC conversations for planning If desires continued full scope of care would recommend outpatient palliative care to follow for decline over time Palliative medicine will follow-up tomorrow  Symptom Management:  Per primary team PMT is available to assist as needed  Prognosis:  Unable to determine  Discharge Planning:  To Be Determined   Discussed with: Patient, family, medical team,  nursing team    Thank you for allowing [place] to participate in the care of VOLVI KAPSNER  PMT will continue to support holistically.  Time Total: 90 min  Detailed review of medical records (labs, imaging, vital signs), medically appropriate exam, discussed with treatment team, counseling and education to patient, family, & staff, documenting clinical information, medication management, coordination of care  Signed by: Wynne Dust, NP Palliative Medicine Team  Team Phone # [phone] (Nights/Weekends)  03/27/2023, 1:36 PM

## 2023-03-27 NOTE — Progress Notes (Addendum)
Patient Name: [name] Date of Encounter: 03/27/2023  Primary Cardiologist: Armanda Magic, MD Electrophysiologist: Regan Lemming, MD  Interval Summary   The patient denies acute complaints this am.  Asking about VT and long term plan.   At this time, the patient denies chest pain, shortness of breath, or any new concerns.  Vital Signs    Vitals:   03/27/23 0300 03/27/23 0400 03/27/23 0500 03/27/23 0600  BP: 96/60 112/74 121/68 120/71  Pulse: 79 77 79 80  Resp: 11 (!) 21 16 19   Temp:      TempSrc:      SpO2: 96% 98% 97% 98%  Weight:      Height:        Intake/Output Summary (Last 24 hours) at 03/27/2023 0722 Last data filed at 03/27/2023 0600 Gross per 24 hour  Intake 1375.7 ml  Output 915 ml  Net 460.7 ml   Filed Weights   03/19/23 1043 03/20/23 0600  Weight: 85.7 kg 88 kg    Physical Exam    GEN- The patient is well appearing, alert and oriented x 3 today.   Lungs- Clear to ausculation bilaterally, normal work of breathing Cardiac- Regular rate and rhythm, VP, no murmurs, rubs or gallops GI- soft, NT, ND, + BS Extremities- no clubbing or cyanosis. No edema  Telemetry    VP 70's, episode of VT requiring ATP x1 12/17 evening (personally reviewed)  Hospital Course    [name] is a 89 y.o. male  PMH of HFrEF, CAD, ICM s/p CRT-D, AS s/p bioprosthetic AVR, perm AFib, CKD stage 4, hyperthyroid, HTN, HLD admitted for VT storm.  IV amiodarone load > persistent episodes of VT.   Assessment & Plan    VT Storm  ICM  HFrEF  Allergy to Ranexa / severe reaction  -continue amiodarone IV 60mg /hr  -continue coreg 25 mg BID  -mexiletine 300 mg BID - goal K+>4, Mg+ > 2   Permanent AF  -Eliquis 2.5mg  BID     HFrEF  HTN  CAD -continue coreg  -no anginal symptoms  -ASA, imdur    VHD s/p Bioprosthetic AVR  AVR functioning well per 2021 TTE    CKD  -Trend BMP / urinary output -Replace electrolytes as indicated -Avoid nephrotoxic agents,  ensure adequate renal perfusion  GOC -palliative care consulted on 12/16, await input   For questions or updates, please contact [facility] Please consult [email or web address] for contact info under Cardiology/STEMI.  Signed, Canary Brim, MSN, APRN, NP-C, AGACNP-BC  [facility] - Elect[facility]iology  03/27/2023, 8:06 AM  I have seen and examined this patient with Canary Brim.  Agree with above, note added to reflect my findings.  Patient had another episode of VT overnight last night requiring ATP therapy.  Remains on high-dose amiodarone.  No acute complaints.  GEN: Well nourished, well developed, in no acute distress  HEENT: normal  Neck: no JVD, carotid bruits, or masses Cardiac: RRR; no murmurs, rubs, or gallops,no edema  Respiratory:  clear to auscultation bilaterally, normal work of breathing GI: soft, nontender, nondistended, + BS MS: no deformity or atrophy  Skin: warm and dry, device site well healed Neuro:  Strength and sensation are intact Psych: euthymic mood, full affect   VT storm: Patient unfortunately continued to have episodes of VT.  He has been on high-dose amiodarone and mexiletine.  He is not an ablation candidate at this time due to advanced age and renal failure which is chronic.  Appreciate palliative care  input.  I did start the discussion on further directions and plan as amiodarone is only helping with his VT and he continues to have episodes. Permanent atrial fibrillation: Continue Eliquis Chronic systolic heart failure: No obvious volume overload Coronary artery disease: No current chest pain.  No plans for intervention Bioprosthetic AVR: Functioning well on most recent echo CKD stage IIIb: Stable   [name] Wrage M. Lauris Keepers MD 03/27/2023 8:15 AM

## 2023-03-28 ENCOUNTER — Encounter: Payer: Self-pay | Admitting: Cardiology

## 2023-03-28 DIAGNOSIS — Z515 Encounter for palliative care: Secondary | ICD-10-CM | POA: Diagnosis not present

## 2023-03-28 DIAGNOSIS — Z7189 Other specified counseling: Secondary | ICD-10-CM | POA: Diagnosis not present

## 2023-03-28 DIAGNOSIS — I472 Ventricular tachycardia, unspecified: Secondary | ICD-10-CM | POA: Diagnosis not present

## 2023-03-28 LAB — BASIC METABOLIC PANEL
Anion gap: 9 (ref 5–15)
BUN: 38 mg/dL — ABNORMAL HIGH (ref 8–23)
CO2: 23 mmol/L (ref 22–32)
Calcium: 8 mg/dL — ABNORMAL LOW (ref 8.9–10.3)
Chloride: 101 mmol/L (ref 98–111)
Creatinine, Ser: 2.09 mg/dL — ABNORMAL HIGH (ref 0.61–1.24)
GFR, Estimated: 30 mL/min — ABNORMAL LOW (ref >60)
Glucose, Bld: 202 mg/dL — ABNORMAL HIGH (ref 70–99)
Potassium: 4.3 mmol/L (ref 3.5–5.1)
Sodium: 133 mmol/L — ABNORMAL LOW (ref 135–145)

## 2023-03-28 LAB — CBC
HCT: 35.8 % — ABNORMAL LOW (ref 39.0–52.0)
Hemoglobin: 11.6 g/dL — ABNORMAL LOW (ref 13.0–17.0)
MCH: 29.4 pg (ref 26.0–34.0)
MCHC: 32.4 g/dL (ref 30.0–36.0)
MCV: 90.6 fL (ref 80.0–100.0)
Platelets: 260 10*3/uL (ref 150–400)
RBC: 3.95 MIL/uL — ABNORMAL LOW (ref 4.22–5.81)
RDW: 13.8 % (ref 11.5–15.5)
WBC: 9.3 10*3/uL (ref 4.0–10.5)
nRBC: 0 % (ref 0.0–0.2)

## 2023-03-28 LAB — MAGNESIUM: Magnesium: 2.2 mg/dL (ref 1.7–2.4)

## 2023-03-28 NOTE — Telephone Encounter (Signed)
Left detailed message for dtr advising her to speak with hospital staff about this as pt is still admitted. Advised to call/mychart if still need to address something.

## 2023-03-28 NOTE — Progress Notes (Signed)
Physical Therapy Treatment Patient Details Name: [name] MRN: [id number] DOB: 11-23-1932 Today's Date: 03/28/2023   History of Present Illness [name] is an 89 y.o. male admitted 12/10 with intermittent episodes of weakness, legs going out, worsening SOB, and ICD shock due to VT. PMH: HFrEF, CAD, ICM s/p CRT-D, AS s/p bioprosthetic AVR, perm AFib, CKD stage 4, hyperthyroidism, HTN, HLD    PT Comments  Pt pleasant and able to continue to progress gait distance with awareness of SOB and utilizes rollator for rest with gait. Pt with audible wheezing end of session and RN aware. Pt encouraged to perform repeated STS for endurance and strengthening. Will continue to follow.   HR 82-110 paced 91-97% on RA 131/75 (93) in chair before gait 148/115 (127) post gait     If plan is discharge home, recommend the following: A little help with walking and/or transfers;A little help with bathing/dressing/bathroom;Assist for transportation;Help with stairs or ramp for entrance   Can travel by private vehicle        Equipment Recommendations  None recommended by PT    Recommendations for Other Services       Precautions / Restrictions Precautions Precautions: Fall Precaution Comments: watch SPO2     Mobility  Bed Mobility               General bed mobility comments: in chair on arrival and end of session    Transfers Overall transfer level: Needs assistance   Transfers: Sit to/from Stand Sit to Stand: Supervision           General transfer comment: cues for hand placement safety and brake use when sitting and rising from rollator. Pt performed 5 sTS at chair with good hand placement and control    Ambulation/Gait Ambulation/Gait assistance: Contact guard assist Gait Distance (Feet): 300 Feet Assistive device: Rollator (4 wheels) Gait Pattern/deviations: Step-through pattern, Decreased stride length, Trunk flexed       General Gait Details: cues for posture,  proximity to rollator and direction. pt walked 300' then 100' after seated rest   Stairs             Wheelchair Mobility     Tilt Bed    Modified Rankin (Stroke Patients Only)       Balance Overall balance assessment: Needs assistance Sitting-balance support: No upper extremity supported, Feet supported Sitting balance-Leahy Scale: Good     Standing balance support: Bilateral upper extremity supported Standing balance-Leahy Scale: Poor Standing balance comment: rollator standing                            Cognition Arousal: Alert Behavior During Therapy: WFL for tasks assessed/performed Overall Cognitive Status: Within Functional Limits for tasks assessed                                          Exercises General Exercises - Lower Extremity Long Arc Quad: AROM, Both, Seated, 20 reps Hip Flexion/Marching: AROM, Both, 5 reps, Seated    General Comments        Pertinent Vitals/Pain Pain Assessment Pain Assessment: No/denies pain    Home Living                          Prior Function  PT Goals (current goals can now be found in the care plan section) Progress towards PT goals: Progressing toward goals    Frequency    Min 1X/week      PT Plan      Co-evaluation              AM-PAC PT "6 Clicks" Mobility   Outcome Measure  Help needed turning from your back to your side while in a flat bed without using bedrails?: None Help needed moving from lying on your back to sitting on the side of a flat bed without using bedrails?: None Help needed moving to and from a bed to a chair (including a wheelchair)?: A Little Help needed standing up from a chair using your arms (e.g., wheelchair or bedside chair)?: A Little Help needed to walk in hospital room?: A Little Help needed climbing 3-5 steps with a railing? : A Little 6 Click Score: 20    End of Session   Activity Tolerance: Patient  tolerated treatment well Patient left: in chair;with call bell/phone within reach Nurse Communication: Mobility status PT Visit Diagnosis: Unsteadiness on feet (R26.81);Muscle weakness (generalized) (M62.81);Other abnormalities of gait and mobility (R26.89)     Time: 0272-5366 PT Time Calculation (min) (ACUTE ONLY): 24 min  Charges:    $Gait Training: 8-22 mins $Therapeutic Activity: 8-22 mins PT General Charges $$ ACUTE PT VISIT: 1 Visit                     Merryl Hacker, PT Acute Rehabilitation Services Office: [phone]    Cristine Polio 03/28/2023, 8:33 AM

## 2023-03-28 NOTE — Progress Notes (Signed)
Daily Progress Note   Patient Name: [name]       Date: 03/28/2023 DOB: 06-22-1932  Age: 89 y.o. MRN#: [id number] Attending Physician: Regan Lemming, MD Primary Care Physician: Joycelyn Rua, MD Admit Date: 03/19/2023 Length of Stay: 9 days  Reason for Consultation/Follow-up: Establishing goals of care  HPI/Patient Profile:  89 y.o. male  with past medical history of HFrEF, CAD, ICM s/p CRT-D, AS s/p bioprosthetic AVR, perm AFib, CKD stage 4, hyperthyroid, HTN, HLD admitted for VT storm.   During hospitalization he has had persistent recurrent episodes of VT.  Palliative medicine was consulted for goals of care.  Subjective:   Subjective: Chart Reviewed. Updates received. Patient Assessed. Created space and opportunity for patient  and family to explore thoughts and feelings regarding current medical situation.  Today's Discussion: Today I saw the patient at the bedside along with EP cardiologist Dr. Elberta Fortis and EP cardiology nurse practitioner Canary Brim, NP.  The patient's daughter [name] was also present.  Dr. Elberta Fortis led to a discussion on the patient's current clinical situation.  In summary his rhythm is persistent but not as fast as it was due to the amiodarone.  Overall it is unlikely that we will be able to prevent these arrhythmias from happening, although amiodarone is helping with the symptoms.  The plan for now is to decrease the Amio from 60 mg/h to 30 mg/h IV and monitor for any arrhythmia problems.  If no problems then attempt switch to oral formulation and again monitor for any problems.  If no problems then likely discharge home on oral amiodarone.  Dr. Elberta Fortis is very clear that eventually his symptoms will likely return and he will feel bad.  At that point it would be time for further discussions about how to proceed including possibly excepting that there is nothing more that can be done and deactivating the ICD and preparing for end-of-life.  However, he  does not feel that is a conversation for now.    We also had a discussion about considering changing CODE STATUS to DNR as if his defibrillator is unable to fix his arrhythmia then cardiopulmonary resuscitation, intubation, and aggressive life support interventions are unlikely to either be successful or provide a meaningful recovery.  Family would like time to discuss this amongst themselves.  I also offered to help with completion of advance directives if they would like while they are in the hospital.  They are also discussed this.    The plan moving forward is that we will monitor the decrease in amiodarone and if we do encounter problems that pose a significant barrier to successful discharge then we would have further conversations at that time.  Family and patient are in alignment with these goals.  The patient stated "you can only do which you can do."  I shared the palliative medicine would shadow the chart for progress and follow-up in 2 days to check in. I provided emotional and general support through therapeutic listening, empathy, sharing of stories, therapeutic touch, and other techniques. I answered all questions and addressed all concerns to the best of my ability.  Review of Systems  Respiratory:  Negative for shortness of breath.   Cardiovascular:  Negative for chest pain.  Neurological:  Negative for dizziness.    Objective:   Vital Signs:  BP 120/68   Pulse 82   Temp (!) 97.5 F (36.4 C) (Axillary)   Resp 17   Ht 6' (1.829 m)   Wt 89.5  kg   SpO2 98%   BMI 26.76 kg/m   Physical Exam Vitals and nursing note reviewed.  Constitutional:      General: He is not in acute distress.    Appearance: He is ill-appearing.  HENT:     Head: Normocephalic and atraumatic.  Pulmonary:     Effort: Pulmonary effort is normal. No respiratory distress.  Abdominal:     General: Abdomen is flat.  Skin:    General: Skin is warm and dry.  Neurological:     General: No focal  deficit present.     Mental Status: He is alert and oriented to person, place, and time.  Psychiatric:        Mood and Affect: Mood normal.        Behavior: Behavior normal.     Palliative Assessment/Data: 40%    Existing Vynca/ACP Documentation: None  Assessment & Plan:   Impression: Present on Admission:  VT (ventricular tachycardia) (HCC)  89 year old male with acute presentation of chronic comorbidities as described above. At this point the patient is doing quite a difficult situation. He has refractory and recurrent VT despite amiodarone. It appears the plan is to try to transition to oral amiodarone soon. However, he will be at high risk for recurrent arrhythmia. He does have a defibrillator which is generally able to correct his arrhythmia, but at some point it may not. At this point it seems that he enjoys his life and is stated that if his quality of life can remain as good as it is now that he wants to keep trying treatments.  Discussion today included consideration of DNR status, plan to slowly decrease amiodarone and eventually switch to oral amiodarone and monitor for problems.  Hopeful for discharge on oral amiodarone with the understanding that he will continue with arrhythmias.  Down the road it may be necessary to discuss turning off ICD.  Overall long-term prognosis is poor.  SUMMARY OF RECOMMENDATIONS   Remain full code and full scope for now Will follow with cardiology reduction in amiodarone Further GOC conversations if there is a significant clinical change during amiodarone reduction Otherwise anticipate eventual discharge home on oral amiodarone Would recommend outpatient palliative care to follow at discharge Will follow-up on family discussions/decisions on CODE STATUS as well as if there is a desire to complete advance directives Palliative medicine will shadow the chart and follow-up in person on 12/21  Symptom Management:  Per primary team PMT is  available to assist as needed  Code Status: Full code  Prognosis: Unable to determine  Discharge Planning: To Be Determined  Discussed with: Patient, family, medical team, nursing team  Thank you for allowing [place] to participate in the care of ANTHONEY DICKE PMT will continue to support holistically.  Time Total: 60 min  Detailed review of medical records (labs, imaging, vital signs), medically appropriate exam, discussed with treatment team, counseling and education to patient, family, & staff, documenting clinical information, medication management, coordination of care  Wynne Dust, NP Palliative Medicine Team  Team Phone # [phone] (Nights/Weekends)  12/06/2020, 8:17 AM

## 2023-03-28 NOTE — Progress Notes (Signed)
   Received page from nursing staff with concern of increased ventricular ectopy and longer runs of NSVT. I reviewed telemetry and noted 2 runs of wide complex tachycardia nearly 1 minute in duration each this evening between 7:30 and 8:00pm. Patient evaluated and he denies sensation of ICD shock. Asymptomatic. Will increase Amiodarone infusion back to 60mg /hr.  Perlie Gold, PA-C

## 2023-03-28 NOTE — Progress Notes (Addendum)
Patient Name: [name] Date of Encounter: 03/28/2023  Primary Cardiologist: Armanda Magic, MD Electrophysiologist: Regan Lemming, MD  Interval Summary   The patient is doing well today. Reports he had an episode of ATP last night and was not symptomatic.    At this time, the patient denies chest pain, shortness of breath, or any new concerns.  Vital Signs    Vitals:   03/28/23 0630 03/28/23 0700 03/28/23 0800 03/28/23 0829  BP:  102/72 131/75 (!) 148/115  Pulse: 81 76 77 (!) 110  Resp: 16 (!) 21 14   Temp:   (!) 97.5 F (36.4 C)   TempSrc:   Oral   SpO2: 97% 96% 98% 95%  Weight:      Height:        Intake/Output Summary (Last 24 hours) at 03/28/2023 0852 Last data filed at 03/28/2023 0600 Gross per 24 hour  Intake 727.27 ml  Output 1500 ml  Net -772.73 ml   Filed Weights   03/19/23 1043 03/20/23 0600 03/28/23 0530  Weight: 85.7 kg 88 kg 89.5 kg    Physical Exam    GEN- The patient is well appearing, alert and oriented x 3 today.   Lungs- Clear to ausculation bilaterally, normal work of breathing Cardiac- Regular rate and rhythm, no murmurs, rubs or gallops GI- soft, NT, ND, + BS Extremities- no clubbing or cyanosis. No edema  Telemetry    SR 70-80's, PVC's, VT with rate 128 s/p ATP x3 (personally reviewed)  Hospital Course    [name] is a 89 y.o. male PMH of HFrEF, CAD, ICM s/p CRT-D, AS s/p bioprosthetic AVR, perm AFib, CKD stage 4, hyperthyroid, HTN, HLD admitted for VT storm.  IV amiodarone load > persistent episodes of VT.   Assessment & Plan    VT Storm  ICM  HFrEF  Allergy to Ranexa / severe reaction  -continue amiodarone IV 60mg /hr  -continue coreg 25mg  BID  -mexiletine 300 mg BID  -follow electrolytes closely, goal K+>4, Mg+>2   Permanent AF  -eliquis for stroke prophylaxis, 2.5 mg BID   HFrEF  HTN  CAD -coreg as above -ASA, imdur  -no anginal symptoms   VHD s/p Bioprosthetic AVR  AVR functioning well per 2021  TTE    CKD  -Trend BMP / urinary output -Replace electrolytes as indicated -Avoid nephrotoxic agents, ensure adequate renal perfusion    GOC -plan to meet with family today, message left for daughter [name]   For questions or updates, please contact [facility] Please consult [email or web address] for contact info under Cardiology/STEMI.  Signed, Canary Brim, MSN, APRN, NP-C, AGACNP-BC [facility] - Electrophysiology  03/28/2023, 9:52 AM  I have seen and examined this patient with Canary Brim.  Agree with above, note added to reflect my findings.  Patient continued to have episodes of ventricular tachycardia.  No acute complaints.  GEN: Well nourished, well developed, in no acute distress  HEENT: normal  Neck: no JVD, carotid bruits, or masses Cardiac: RRR; no murmurs, rubs, or gallops,no edema  Respiratory:  clear to auscultation bilaterally, normal work of breathing GI: soft, nontender, nondistended, + BS MS: no deformity or atrophy  Skin: warm and dry, device site well healed Neuro:  Strength and sensation are intact Psych: euthymic mood, full affect   VT storm: Patient continues to have episodes of ventricular tachycardia despite IV amiodarone.  Tachycardia has slowed and thus patient is not symptomatic.  Tachycardia is quite responsive to ATP therapy.  Corin Tilly reduce amiodarone to 30 mg/h with the plan of potential switch to oral amiodarone tomorrow. Permanent atrial fibrillation: Continue Eliquis Chronic systolic heart failure: No obvious volume overload Coronary artery disease: No current chest pain Bioprosthetic AVR: Functioning well and most recent echo CKD stage IIIb-IV: Stable  Long discussion today with the patient and his daughter.  Discussed CODE STATUS as well as outlook with continued episodes of VT.  Patient [name] likely go home on p.o. amiodarone with the knowledge that he [name] continue to have VT treated with ATP.  Introduced the idea of turning off the  defibrillator at some point as well as DNR status.  Patient [name] discuss this further with his family.  [name] M. Artrice Kraker MD 03/28/2023 1:45 PM

## 2023-03-28 NOTE — Plan of Care (Signed)
  Problem: Activity: Goal: Ability to tolerate increased activity will improve Outcome: Progressing   Problem: Health Behavior/Discharge Planning: Goal: Ability to safely manage health-related needs after discharge will improve Outcome: Progressing   Problem: Education: Goal: Knowledge of General Education information will improve Description: Including pain rating scale, medication(s)/side effects and non-pharmacologic comfort measures Outcome: Progressing   Problem: Clinical Measurements: Goal: Ability to maintain clinical measurements within normal limits will improve Outcome: Progressing

## 2023-03-29 DIAGNOSIS — Z7189 Other specified counseling: Secondary | ICD-10-CM | POA: Diagnosis not present

## 2023-03-29 DIAGNOSIS — I472 Ventricular tachycardia, unspecified: Secondary | ICD-10-CM | POA: Diagnosis not present

## 2023-03-29 DIAGNOSIS — Z515 Encounter for palliative care: Secondary | ICD-10-CM | POA: Diagnosis not present

## 2023-03-29 LAB — BASIC METABOLIC PANEL
Anion gap: 9 (ref 5–15)
BUN: 41 mg/dL — ABNORMAL HIGH (ref 8–23)
CO2: 23 mmol/L (ref 22–32)
Calcium: 8.2 mg/dL — ABNORMAL LOW (ref 8.9–10.3)
Chloride: 101 mmol/L (ref 98–111)
Creatinine, Ser: 2.17 mg/dL — ABNORMAL HIGH (ref 0.61–1.24)
GFR, Estimated: 29 mL/min — ABNORMAL LOW (ref >60)
Glucose, Bld: 138 mg/dL — ABNORMAL HIGH (ref 70–99)
Potassium: 4.5 mmol/L (ref 3.5–5.1)
Sodium: 133 mmol/L — ABNORMAL LOW (ref 135–145)

## 2023-03-29 LAB — MAGNESIUM: Magnesium: 2.3 mg/dL (ref 1.7–2.4)

## 2023-03-29 NOTE — Progress Notes (Signed)
Occupational Therapy Treatment Patient Details Name: [name] MRN: [id number] DOB: October 09, 1932 Today's Date: 03/29/2023   History of present illness [name] is an 89 y.o. male admitted 12/10 with intermittent episodes of weakness, legs going out, worsening SOB, and ICD shock due to VT. PMH: HFrEF, CAD, ICM s/p CRT-D, AS s/p bioprosthetic AVR, perm AFib, CKD stage 4, hyperthyroidism, HTN, HLD   OT comments  Pt making steady progress towards OT goals. Pt reported fatigue after long walk yesterday so opted for shorter mobility using RW. Once back to room, pt reported "I think I couldve gone a little further". Pt able to manage toileting tasks, toilet transfers and ADLs standing at sink with assist only for lines. Discussed self monitoring need for rest breaks and energy conservation w/ pt implementing many strategies at home. Provided handout for further information. VSS on RA      If plan is discharge home, recommend the following:  A little help with bathing/dressing/bathroom;Assistance with cooking/housework;Assist for transportation;Help with stairs or ramp for entrance   Equipment Recommendations  BSC/3in1    Recommendations for Other Services      Precautions / Restrictions Precautions Precautions: Fall Precaution Comments: watch SPO2 Restrictions Weight Bearing Restrictions Per Provider Order: No       Mobility Bed Mobility               General bed mobility comments: in chair    Transfers Overall transfer level: Modified independent Equipment used: Rolling walker (2 wheels) Transfers: Sit to/from Stand Sit to Stand: Modified independent (Device/Increase time)                 Balance Overall balance assessment: Needs assistance Sitting-balance support: No upper extremity supported, Feet supported Sitting balance-Leahy Scale: Good     Standing balance support: Bilateral upper extremity supported, No upper extremity supported, During functional  activity Standing balance-Leahy Scale: Fair                             ADL either performed or assessed with clinical judgement   ADL Overall ADL's : Needs assistance/impaired     Grooming: Supervision/safety;Standing;Oral care;Wash/dry hands Grooming Details (indicate cue type and reason): standing at sink, supervision for line safety only                 Toilet Transfer: Ambulation;Rolling walker (2 wheels);Supervision/safety Toilet Transfer Details (indicate cue type and reason): to bathroom on return to room after hallway mobility. able to manage clothing and stand from toilet without assist Toileting- Clothing Manipulation and Hygiene: Modified independent;Sitting/lateral lean       Functional mobility during ADLs: Supervision/safety;Contact guard assist;Rolling walker (2 wheels) General ADL Comments: Discussed energy conservation strategies that pt already using at home for yard work, listening to body for rest breaks as needed w pt report warning sign is when "ear gets stopped up"    Extremity/Trunk Assessment Upper Extremity Assessment Upper Extremity Assessment: Overall WFL for tasks assessed;Right hand dominant   Lower Extremity Assessment Lower Extremity Assessment: Defer to PT evaluation        Vision   Vision Assessment?: No apparent visual deficits   Perception     Praxis      Cognition Arousal: Alert Behavior During Therapy: WFL for tasks assessed/performed Overall Cognitive Status: Within Functional Limits for tasks assessed  General Comments: HOH which causes some delayed responses. Some STM difficulty but reports this is baseline.        Exercises      Shoulder Instructions       General Comments Family at bedside    Pertinent Vitals/ Pain       Pain Assessment Pain Assessment: No/denies pain  Home Living                                          Prior  Functioning/Environment              Frequency  Min 1X/week        Progress Toward Goals  OT Goals(current goals can now be found in the care plan section)  Progress towards OT goals: Progressing toward goals  Acute Rehab OT Goals Patient Stated Goal: sort out medicines OT Goal Formulation: With patient Time For Goal Achievement: 04/09/23 Potential to Achieve Goals: Good ADL Goals Pt Will Perform Lower Body Dressing: with supervision;sitting/lateral leans;sit to/from stand Pt Will Transfer to Toilet: with supervision;ambulating Pt/caregiver will Perform Home Exercise Program: Increased strength;With theraband;Both right and left upper extremity Additional ADL Goal #1: Pt will gather supplies needed to perform dressing with supervision  Plan      Co-evaluation                 AM-PAC OT "6 Clicks" Daily Activity     Outcome Measure   Help from another person eating meals?: None Help from another person taking care of personal grooming?: A Little Help from another person toileting, which includes using toliet, bedpan, or urinal?: A Little Help from another person bathing (including washing, rinsing, drying)?: A Little Help from another person to put on and taking off regular upper body clothing?: A Little Help from another person to put on and taking off regular lower body clothing?: A Little 6 Click Score: 19    End of Session Equipment Utilized During Treatment: Gait belt;Rolling walker (2 wheels)  OT Visit Diagnosis: Unsteadiness on feet (R26.81);Muscle weakness (generalized) (M62.81)   Activity Tolerance Patient tolerated treatment well   Patient Left in chair;with call bell/phone within reach;with family/visitor present   Nurse Communication Mobility status        Time: 0822-0850 OT Time Calculation (min): 28 min  Charges: OT General Charges $OT Visit: 1 Visit OT Treatments $Self Care/Home Management : 8-22 mins $Therapeutic Activity: 8-22  mins  Bradd Canary, OTR/L Acute Rehab Services Office: [phone]   Lorre Munroe 03/29/2023, 9:03 AM

## 2023-03-29 NOTE — Progress Notes (Cosign Needed)
Patient Name: [name] Date of Encounter: 03/29/2023  Primary Cardiologist: Armanda Magic, MD Electrophysiologist: Regan Lemming, MD  Interval Summary   The patient is doing well today.  He was not aware of the ATP he received overnight.   At this time, the patient denies chest pain, shortness of breath, or any new concerns.  Vital Signs    Vitals:   03/29/23 0200 03/29/23 0300 03/29/23 0400 03/29/23 0500  BP: 99/64 107/79 110/69 117/64  Pulse: 84 83 79 81  Resp: 13 16 16 14   Temp:   98 F (36.7 C)   TempSrc:   Oral   SpO2: 99% 96% 95% 97%  Weight:    89.5 kg  Height:        Intake/Output Summary (Last 24 hours) at 03/29/2023 0847 Last data filed at 03/29/2023 0500 Gross per 24 hour  Intake 1378.17 ml  Output 1450 ml  Net -71.83 ml   Filed Weights   03/20/23 0600 03/28/23 0530 03/29/23 0500  Weight: 88 kg 89.5 kg 89.5 kg    Physical Exam    GEN- The patient is well appearing, alert and oriented x 3 today.   Lungs- Clear to ausculation bilaterally, normal work of breathing Cardiac- Regular rate and rhythm, no murmurs, rubs or gallops GI- soft, NT, ND, + BS Extremities- no clubbing or cyanosis. No edema  Telemetry    VP 70-80's, PVC's, multiple episodes of NSVT, VT requiring ATP x5 from 7p-7a, VT rates 12 (personally reviewed)  Hospital Course    [name] is a 89 y.o. male  PMH of HFrEF, CAD, ICM s/p CRT-D, AS s/p bioprosthetic AVR, perm AFib, CKD stage 4, hyperthyroid, HTN, HLD admitted for VT storm.  IV amiodarone load > persistent episodes of VT despite prolonged amiodarone IV.   Assessment & Plan    VT Storm  ICM  HFrEF  Allergy to Ranexa / severe reaction  -continue amiodarone at 30mg /hr  -limited options in regards to further therapy > working toward weaning his amio IV, anticipate he will have recurrent VT that will receive ATP.   -consider transition to oral amiodarone  -coreg 25mg  BID -mexiletine 300mg  BID  -follow  electrolytes closely, goal K+>4, Mg+>2    Permanent AF  -eliquis 2.5mg  BID for stroke prophylaxis   HFrEF  HTN  CAD -coreg as above  -ASA, imdur  -no anginal symptoms   VHD s/p Bioprosthetic AVR  AVR functioning well per 2021 TTE    CKD  -Trend BMP / urinary output -Replace electrolytes as indicated -Avoid nephrotoxic agents, ensure adequate renal perfusion    GOC -discussion 12/19 with Dr. Elberta Fortis > reviewed limited antiarrhythmic options for VT at this point.   -Patient & family still discussing if he would want CPR/mechanical ventilation, & turning off ICD therapy at some point in future. -support offered to patient & family   For questions or updates, please contact [facility] Please consult [email or web address] for contact info under Cardiology/STEMI.  Signed, Canary Brim, MSN, APRN, NP-C, AGACNP-BC [facility] - Electrophysiology  03/29/2023, 9:11 AM  I have seen and examined this patient with Canary Brim.  Agree with above, note added to reflect my findings.  Continued episodes of ventricular tachycardia.  Patient asymptomatic.  GEN: Well nourished, well developed, in no acute distress  HEENT: normal  Neck: no JVD, carotid bruits, or masses Cardiac: RRR; no murmurs, rubs, or gallops,no edema  Respiratory:  clear to auscultation bilaterally, normal work of breathing GI: soft,  nontender, nondistended, + BS MS: no deformity or atrophy  Skin: warm and dry, device site well healed Neuro:  Strength and sensation are intact Psych: euthymic mood, full affect   VT storm: Currently on IV amiodarone and mexiletine.  Continue to have been VT.  Will reduce amiodarone to 30 mg daily.  Patient is asymptomatic.  Understands that he will continue to have VT. Coronary artery disease: No current chest pain.  Continue antianginals. Chronic systolic heart failure: Volume status stable CKD stage IV: Creatinine stable Bioprosthetic AVR: Stable on most recent echo  Will  M. Camnitz MD 03/30/2023 8:12 AM

## 2023-03-29 NOTE — Progress Notes (Signed)
This chaplain responded to PMT NP-Eric consult for Pt. spiritual care. The Pt. is visiting with a friend as the chaplain arrived.  This chaplain listened reflectively as the Pt.'s storytelling included multiple generations of family(wife, children, grandmother) and his career. The Pt. added the importance of patience as he talked about his history with arrhythmia and amiodarone. The Pt. is hopeful the next couple of days will allow him to return home. 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  The chaplain invitation for prayer and F/U spiritual care was accepted by the Pt.  Chaplain Stephanie Acre [phone]

## 2023-03-29 NOTE — Progress Notes (Signed)
Daily Progress Note   Patient Name: [name]       Date: 03/29/2023 DOB: 29-Apr-1932  Age: 89 y.o. MRN#: [id number] Attending Physician: Regan Lemming, MD Primary Care Physician: Joycelyn Rua, MD Admit Date: 03/19/2023 Length of Stay: 10 days  Reason for Consultation/Follow-up: Establishing goals of care  HPI/Patient Profile:  89 y.o. male  with past medical history of HFrEF, CAD, ICM s/p CRT-D, AS s/p bioprosthetic AVR, perm AFib, CKD stage 4, hyperthyroid, HTN, HLD admitted for VT storm.   During hospitalization he has had persistent recurrent episodes of VT.  Palliative medicine was consulted for goals of care.  Subjective:   Subjective: Chart Reviewed. Updates received. Patient Assessed. Created space and opportunity for patient  and family to explore thoughts and feelings regarding current medical situation.  Today's Discussion: Today saw the patient at bedside, no family was present.  Apparently his family has been here earlier this morning.  We discussed that he had an arrhythmia last night and his amiodarone was increased.  However, we again reviewed our conversation yesterday that he would likely continue to have these arrhythmias.  He states he did not feel the arrhythmia.  He did have some dyspnea with ambulation yesterday but at rest he is not having any dyspnea, chest pain, nausea, vomiting.  The reviewed and highlighted our conversation from yesterday that the goal is to reduce the amiodarone to an oral form that he can take at home, excepting that he will continue to have these arrhythmias, to continue current scope and treatment as long as he is feeling well.  However, in the future when his quality of life declines then further conversations can be had regarding possibly turning off the defibrillator and/or approaching a more comfort focused path.  I asked about discussions about CODE STATUS.  He states his daughter understands what he wants but he  acknowledges that his whole family needs to know this.  I encouraged him to have these discussions with his family.  His wishes are to "give it a try, but do not let me linger for on and on."  Encouraged him to discuss this with his family including what is his definition of "and on and on" being that they will have to make that decision if he is unable to communicate to [place].  I highlighted the importance of these discussions and he agreed with this.  I shared that palliative medicine will continue to follow.  I would check up with him tomorrow and see how he was doing.  I reconfirmed our goal to refer to outpatient palliative care to follow along his health as an outpatient to help with discussions as needed.  I provided emotional and general support through therapeutic listening, empathy, sharing of stories, therapeutic touch, and other techniques. I answered all questions and addressed all concerns to the best of my ability.  Review of Systems  Respiratory:  Negative for shortness of breath.   Cardiovascular:  Negative for chest pain.  Gastrointestinal:  Negative for nausea and vomiting.  Neurological:  Negative for dizziness.    Objective:   Vital Signs:  BP 106/64 (BP Location: Left Arm)   Pulse 79   Temp (!) 97.5 F (36.4 C) (Oral)   Resp 16   Ht 6' (1.829 m)   Wt 89.5 kg   SpO2 100%   BMI 26.76 kg/m   Physical Exam Vitals and nursing note reviewed.  Constitutional:      General: He is not in  acute distress.    Appearance: He is ill-appearing.  HENT:     Head: Normocephalic and atraumatic.  Pulmonary:     Effort: Pulmonary effort is normal. No respiratory distress.  Abdominal:     General: Abdomen is flat.  Skin:    General: Skin is warm and dry.  Neurological:     General: No focal deficit present.     Mental Status: He is alert and oriented to person, place, and time.  Psychiatric:        Mood and Affect: Mood normal.        Behavior: Behavior normal.      Palliative Assessment/Data: 50%    Existing Vynca/ACP Documentation: None  Assessment & Plan:   Impression: Present on Admission:  VT (ventricular tachycardia) (HCC)  89 year old male with acute presentation of chronic comorbidities as described above. At this point the patient is doing quite a difficult situation. He has refractory and recurrent VT despite amiodarone. It appears the plan is to try to transition to oral amiodarone soon. However, he will be at high risk for recurrent arrhythmia. He does have a defibrillator which is generally able to correct his arrhythmia, but at some point it may not. At this point it seems that he enjoys his life and is stated that if his quality of life can remain as good as it is now that he wants to keep trying treatments.  Discussion today included consideration of DNR status, plan to slowly decrease amiodarone and eventually switch to oral amiodarone and monitor for problems.  Hopeful for discharge on oral amiodarone with the understanding that he will continue with arrhythmias.  Down the road it may be necessary to discuss turning off ICD.  Overall long-term prognosis is poor.  SUMMARY OF RECOMMENDATIONS   Remain full code and full scope for now Will follow with cardiology reduction in amiodarone Further GOC conversations if there is a significant clinical change during amiodarone reduction Otherwise anticipate eventual discharge home on oral amiodarone Would recommend outpatient palliative care to follow at discharge Continue to encourage CODE STATUS discussions amongst family Palliative medicine will continue to follow  Symptom Management:  Per primary team PMT is available to assist as needed  Code Status: Full code  Prognosis: Unable to determine  Discharge Planning: To Be Determined  Discussed with: Patient, medical team, nursing team  Thank you for allowing [place] to participate in the care of [name] PMT will continue  to support holistically.  Time Total: 30 min  Detailed review of medical records (labs, imaging, vital signs), medically appropriate exam, discussed with treatment team, counseling and education to patient, family, & staff, documenting clinical information, medication management, coordination of care  Wynne Dust, NP Palliative Medicine Team  Team Phone # [phone] (Nights/Weekends)  12/06/2020, 8:17 AM

## 2023-03-29 NOTE — Plan of Care (Signed)

## 2023-03-30 DIAGNOSIS — I472 Ventricular tachycardia, unspecified: Secondary | ICD-10-CM | POA: Diagnosis not present

## 2023-03-30 DIAGNOSIS — Z7189 Other specified counseling: Secondary | ICD-10-CM | POA: Diagnosis not present

## 2023-03-30 DIAGNOSIS — Z515 Encounter for palliative care: Secondary | ICD-10-CM | POA: Diagnosis not present

## 2023-03-30 LAB — BASIC METABOLIC PANEL
Anion gap: 9 (ref 5–15)
BUN: 39 mg/dL — ABNORMAL HIGH (ref 8–23)
CO2: 23 mmol/L (ref 22–32)
Calcium: 8.4 mg/dL — ABNORMAL LOW (ref 8.9–10.3)
Chloride: 100 mmol/L (ref 98–111)
Creatinine, Ser: 2.21 mg/dL — ABNORMAL HIGH (ref 0.61–1.24)
GFR, Estimated: 28 mL/min — ABNORMAL LOW (ref >60)
Glucose, Bld: 153 mg/dL — ABNORMAL HIGH (ref 70–99)
Potassium: 4.7 mmol/L (ref 3.5–5.1)
Sodium: 132 mmol/L — ABNORMAL LOW (ref 135–145)

## 2023-03-30 MED ORDER — AMIODARONE HCL 200 MG PO TABS
400.0000 mg | ORAL_TABLET | Freq: Two times a day (BID) | ORAL | Status: DC
  Administered 2023-03-30 – 2023-04-02 (×7): 400 mg via ORAL
  Filled 2023-03-30 (×7): qty 2

## 2023-03-30 MED ORDER — ORAL CARE MOUTH RINSE: 15.0000 mL | OROMUCOSAL | Status: DC | PRN

## 2023-03-30 NOTE — Progress Notes (Addendum)
Daily Progress Note   Patient Name: [name]       Date: 03/30/2023 DOB: [birth year]  Age: 89 y.o. MRN#: [id number] Attending Physician: Regan Lemming, MD Primary Care Physician: Joycelyn Rua, MD Admit Date: 03/19/2023 Length of Stay: 11 days  Reason for Consultation/Follow-up: Establishing goals of care  HPI/Patient Profile:  89 y.o. male  with past medical history of HFrEF, CAD, ICM s/p CRT-D, AS s/p bioprosthetic AVR, perm AFib, CKD stage 4, hyperthyroid, HTN, HLD admitted for VT storm.   During hospitalization he has had persistent recurrent episodes of VT.  Palliative medicine was consulted for goals of care.  Subjective:   Subjective: Chart Reviewed. Updates received. Patient Assessed. Created space and opportunity for patient  and family to explore thoughts and feelings regarding current medical situation.  Today's Discussion: Today saw the patient at bedside, no family was present.  Apparently his family has been here earlier this morning.  Today he denies chest pain, dyspnea at rest.  However, when he gets up and moves around he does get a bit dyspneic which is relieved by rest.  He notes some increased weakness this morning, which she hopes is just a "blip" as he has not had this in the past several days.  He is working with PT and ambulates quite a bit, per his description.  We discussed that today he was switched over to oral amiodarone.  We again reviewed thoughts about his clinical picture and the plan including that he will likely continue to have these arrhythmias, however he typically does not feel them.  We again reviewed that as long as his quality of life remains similar to what it is now he is okay to continue full scope of treatment.  We did discuss that in the future he may become more symptomatic and I encouraged him to reach out to Dr. Elberta Fortis with EP cardiology if his quality of life begins to suffer so that they can have further discussions on how to  proceed.  We also reviewed his desire for full code, although not wanting to be "kept alive artificially on and on and on."  I again encouraged him to discuss what this means to him with his family as they would be the ones to decide when to say "enough is enough" if he does end up on life support.  I also emphasized that if his defibrillator was not able to get him out of bed for the heart rhythm that his chances of surviving resuscitation are actually quite low.  He seems to understand this.  Finally I told him that given his plan and goals are quite clear, as long as he continues to progress, he does not need ongoing daily palliative support.  I provided emotional and general support through therapeutic listening, empathy, sharing of stories, therapeutic touch, and other techniques. I answered all questions and addressed all concerns to the best of my ability.  Review of Systems  Respiratory:  Negative for shortness of breath.   Cardiovascular:  Negative for chest pain.  Gastrointestinal:  Negative for nausea and vomiting.  Neurological:  Negative for dizziness.    Objective:   Vital Signs:  BP (!) 96/58 (BP Location: Left Arm)   Pulse 81   Temp (!) 96.8 F (36 C) (Axillary)   Resp 11   Ht 6' (1.829 m)   Wt 89.5 kg   SpO2 96%   BMI 26.76 kg/m   Physical Exam Vitals and nursing note reviewed.  Constitutional:      General: He is not in acute distress.    Appearance: He is ill-appearing.  HENT:     Head: Normocephalic and atraumatic.  Pulmonary:     Effort: Pulmonary effort is normal. No respiratory distress.  Abdominal:     General: Abdomen is flat.  Skin:    General: Skin is warm and dry.  Neurological:     General: No focal deficit present.     Mental Status: He is alert and oriented to person, place, and time.  Psychiatric:        Mood and Affect: Mood normal.        Behavior: Behavior normal.     Palliative Assessment/Data: 50%    Existing Vynca/ACP  Documentation: None  Assessment & Plan:   Impression: Present on Admission:  VT (ventricular tachycardia) (HCC)  89 year old male with acute presentation of chronic comorbidities as described above. At this point the patient is doing quite a difficult situation. He has refractory and recurrent VT despite amiodarone. It appears the plan is to try to transition to oral amiodarone soon. However, he will be at high risk for recurrent arrhythmia. He does have a defibrillator which is generally able to correct his arrhythmia, but at some point it may not. At this point it seems that he enjoys his life and is stated that if his quality of life can remain as good as it is now that he wants to keep trying treatments.  Discussion today included consideration of DNR status, plan to slowly decrease amiodarone and eventually switch to oral amiodarone and monitor for problems.  Hopeful for discharge on oral amiodarone with the understanding that he will continue with arrhythmias.  Down the road it may be necessary to discuss turning off ICD.  Overall long-term prognosis is poor.  SUMMARY OF RECOMMENDATIONS   Remain full code and full scope Further GOC conversations if there is a significant clinical change during amiodarone reduction Anticipate discharge in the next 48 to 72 hours if he continues to do well Would recommend outpatient palliative care to follow at discharge Continue to encourage discussions with family about his wishes should he decompensate in the coming weeks and months TOC consult for OP palliative care Palliative medicine will shadow the chart from a distance for acute decompensation justifying further GOC conversations Please reach out for any significant clinical change or new palliative needs  Symptom Management:  Per primary team PMT is available to assist as needed  Code Status: Full code  Prognosis: Unable to determine  Discharge Planning: To Be Determined  Discussed with:  Patient, medical team, nursing team  Thank you for allowing [place] to participate in the care of [name] PMT will continue to support holistically.  Time Total: 45 min  Detailed review of medical records (labs, imaging, vital signs), medically appropriate exam, discussed with treatment team, counseling and education to patient, family, & staff, documenting clinical information, medication management, coordination of care  Wynne Dust, NP Palliative Medicine Team  Team Phone # [phone] (Nights/Weekends)  12/06/2020, 8:17 AM

## 2023-03-30 NOTE — Progress Notes (Signed)
Paged about ongoing asx sustained VT. Having more frequent sustained runs of VT. Admitted for VT storm and not a candidate for VT ablation. On coreg 25 mg bid and mexiletine 300 mg bid in addition to amio 400 mg bid (first two doses today of PO amio). He had been on amio 60 mg/h from 12/13 through 12/19 at 1100 and on 30 mg/h since then. This was d/c on 12/21 at 0824. He has had ~6 days of amio at 60 mg/h with additional day 30 mg/h and 2 doses of PO amio so he is just over 20 gm PO equiv since admission. He is already on mex 300 mg bid and has had severe prior rxn to ranexa. He has had prior VT storm and previously has been on sotalol with ongoing VT. LVEF in 2021 was 25-30%, I don't have an updated recent. Unfortunately his options for VT management are limited given his age/comorbidities and scar related MMVT since he is not a candidate for interventions. His VT will inevitably recur as an OP so I don't think resuming amio gtt makes sense given the larger picture. Continued GOC discussions regarding how sick his heart is with him and his family. For now VT is much slower than previous on amio. He was sleeping when these most recent episodes were occurring. No ICD shocks at the current rates.

## 2023-03-30 NOTE — Progress Notes (Signed)
Rounding Note    Patient Name: [name] Date of Encounter: 03/30/2023  [facility] Cardiologist: Armanda Magic, MD   Subjective   Continues to feel well.  Unaware of ATP overnight.  Inpatient Medications    Scheduled Meds:  apixaban  2.5 mg Oral BID   atorvastatin  40 mg Oral Daily   carvedilol  25 mg Oral BID WC   Chlorhexidine Gluconate Cloth  6 each Topical Daily   famotidine  10 mg Oral QHS   furosemide  20 mg Oral Daily   icosapent Ethyl  2 g Oral BID   isosorbide mononitrate  30 mg Oral Daily   mexiletine  300 mg Oral Q12H   multivitamin with minerals  1 tablet Oral Daily   pantoprazole  40 mg Oral QAC breakfast   polyethylene glycol  17 g Oral Daily   senna-docusate  1 tablet Oral Daily   sodium bicarbonate  650 mg Oral Daily   sodium chloride flush  10-40 mL Intracatheter Q12H   Continuous Infusions:  amiodarone 30 mg/hr (03/30/23 0600)   PRN Meds: acetaminophen, albuterol, nitroGLYCERIN, mouth rinse, sodium chloride flush   Vital Signs    Vitals:   03/30/23 0300 03/30/23 0400 03/30/23 0500 03/30/23 0600  BP: 100/66 (!) 141/70 118/73 122/69  Pulse: 80 78 78 82  Resp: 11 16 15 17   Temp:  98 F (36.7 C)    TempSrc:  Oral    SpO2: 96% 94% 97% 96%  Weight:      Height:        Intake/Output Summary (Last 24 hours) at 03/30/2023 0821 Last data filed at 03/30/2023 0745 Gross per 24 hour  Intake 823.56 ml  Output 1576 ml  Net -752.44 ml      03/29/2023    5:00 AM 03/28/2023    5:30 AM 03/20/2023    6:00 AM  Last 3 Weights  Weight (lbs) 197 lb 5 oz 197 lb 5 oz 194 lb 0.1 oz  Weight (kg) 89.5 kg 89.5 kg 88 kg      Telemetry    Atrial fibrillation, ventricular paced, intermittent VT with ATP- Personally Reviewed  ECG     - Personally Reviewed  Physical Exam   GEN: No acute distress.   Neck: No JVD Cardiac: RRR, no murmurs, rubs, or gallops.  Respiratory: Clear to auscultation bilaterally. GI: Soft, nontender,  non-distended  MS: No edema; No deformity. Neuro:  Nonfocal  Psych: Normal affect   Labs    High Sensitivity Troponin:   Recent Labs  Lab 03/15/23 2020 03/15/23 2200 03/19/23 1052 03/19/23 1245  TROPONINIHS 16 11 21* 20*     Chemistry Recent Labs  Lab 03/27/23 0313 03/28/23 0312 03/29/23 0348 03/30/23 0428  NA 134* 133* 133* 132*  K 4.4 4.3 4.5 4.7  CL 102 101 101 100  CO2 23 23 23 23   GLUCOSE 145* 202* 138* 153*  BUN 39* 38* 41* 39*  CREATININE 2.02* 2.09* 2.17* 2.21*  CALCIUM 8.2* 8.0* 8.2* 8.4*  MG 2.2 2.2 2.3  --   GFRNONAA 31* 30* 29* 28*  ANIONGAP 9 9 9 9     Lipids No results for input(s): "CHOL", "TRIG", "HDL", "LABVLDL", "LDLCALC", "CHOLHDL" in the last 168 hours.  Hematology Recent Labs  Lab 03/28/23 0313  WBC 9.3  RBC 3.95*  HGB 11.6*  HCT 35.8*  MCV 90.6  MCH 29.4  MCHC 32.4  RDW 13.8  PLT 260   Thyroid No results for input(s): "  TSH", "FREET4" in the last 168 hours.  BNPNo results for input(s): "BNP", "PROBNP" in the last 168 hours.  DDimer No results for input(s): "DDIMER" in the last 168 hours.   Radiology    No results found.  Cardiac Studies     Patient Profile     89 y.o. male with chronic systolic heart failure and coronary disease presented to the hospital with VT storm  Assessment & Plan    VT storm: Currently on amiodarone.  Patient understands that he [name] go home with continued episodes of ventricular tachycardia requiring ATP therapy.  [name] switch to p.o. amiodarone today.  Goals of care discussion ongoing.  Permanent atrial fibrillation: Continue Eliquis  Chronic systolic heart failure: No obvious volume overload.  Continue current medications  Coronary artery disease: No current pain.  Continue antianginals.  Bioprosthetic AVR: Functioning appropriately on echo 2021  CKD stage IV: Stable  For questions or updates, please contact [facility] Please consult [email or web address] for contact info under         Signed, Terrian Ridlon Jorja Loa, MD  03/30/2023, 8:21 AM

## 2023-03-31 ENCOUNTER — Inpatient Hospital Stay (HOSPITAL_COMMUNITY): Payer: Medicare HMO

## 2023-03-31 DIAGNOSIS — I472 Ventricular tachycardia, unspecified: Secondary | ICD-10-CM | POA: Diagnosis not present

## 2023-03-31 LAB — BASIC METABOLIC PANEL
Anion gap: 11 (ref 5–15)
BUN: 42 mg/dL — ABNORMAL HIGH (ref 8–23)
CO2: 22 mmol/L (ref 22–32)
Calcium: 8.7 mg/dL — ABNORMAL LOW (ref 8.9–10.3)
Chloride: 101 mmol/L (ref 98–111)
Creatinine, Ser: 2.46 mg/dL — ABNORMAL HIGH (ref 0.61–1.24)
GFR, Estimated: 25 mL/min — ABNORMAL LOW (ref >60)
Glucose, Bld: 172 mg/dL — ABNORMAL HIGH (ref 70–99)
Potassium: 4.9 mmol/L (ref 3.5–5.1)
Sodium: 134 mmol/L — ABNORMAL LOW (ref 135–145)

## 2023-03-31 LAB — MAGNESIUM: Magnesium: 2.2 mg/dL (ref 1.7–2.4)

## 2023-03-31 MED ORDER — CARVEDILOL 12.5 MG PO TABS
12.5000 mg | ORAL_TABLET | Freq: Two times a day (BID) | ORAL | Status: DC
  Administered 2023-03-31 – 2023-04-01 (×2): 12.5 mg via ORAL
  Filled 2023-03-31 (×2): qty 1

## 2023-03-31 NOTE — Progress Notes (Signed)
Physical Therapy Treatment Patient Details Name: [name] MRN: [id number] DOB: 1932-09-22 Today's Date: 03/31/2023   History of Present Illness [name] is an 89 y.o. male admitted 12/10 with intermittent episodes of weakness, legs going out, worsening SOB, and ICD shock due to VT. PMH: HFrEF, CAD, ICM s/p CRT-D, AS s/p bioprosthetic AVR, perm AFib, CKD stage 4, hyperthyroidism, HTN, HLD    PT Comments  Received repeat orders for PT to assess pt again as pt reported feeling weaker this date. While the pt was still able to transfer to stand and ambulate in the halls with a rollator and only CGA for safety, he did display and report fatigue with prolonged static standing. See BP measures below. Notified RN and MD. Educated pt on changing positions slowly/gradually and awareness of signs/symptoms of orthostatic hypotension and to perform AROM of extremities as needed to try to help his BP. Finished session with serial sit <> stand reps, encouraging pt to focus on eccentric quads control when sitting due to noted weakness and poor control with this. Will continue to follow acutely. Pt would benefit from HHPT follow-up.  BP- 108/81 sitting 74/44 standing (he denied lightheadedness but reported his "legs just got weak" and needed to sit) 132/108 (117) while ambulating (unsure of accuracy due to grip on rollator) 83/50 (60) while standing after he finished ambulating 97/61 (73) sitting end of session     If plan is discharge home, recommend the following: A little help with walking and/or transfers;A little help with bathing/dressing/bathroom;Assist for transportation;Help with stairs or ramp for entrance;Assistance with cooking/housework   Can travel by private vehicle        Equipment Recommendations  None recommended by PT    Recommendations for Other Services       Precautions / Restrictions Precautions Precautions: Fall Precaution Comments: watch SpO2 & BP (orthostatic  12/22) Restrictions Weight Bearing Restrictions Per Provider Order: No     Mobility  Bed Mobility               General bed mobility comments: in chair on arrival and end of session    Transfers Overall transfer level: Needs assistance Equipment used: Rollator (4 wheels) Transfers: Sit to/from Stand Sit to Stand: Contact guard assist           General transfer comment: Pt demonstrating good hand placement with transfers to stand from recliner to rollator, x > 7 reps total during session, CGA for safety due to pt reporting feeling weaker and noted BP drop this date    Ambulation/Gait Ambulation/Gait assistance: Contact guard assist Gait Distance (Feet): 200 Feet Assistive device: Rollator (4 wheels) Gait Pattern/deviations: Step-through pattern, Decreased stride length, Trunk flexed Gait velocity: decr Gait velocity interpretation: 1.31 - 2.62 ft/sec, indicative of limited community ambulator   General Gait Details: Provided cues for improved bil feet clearance but poor success noted. Pt maintains a slightly flexed posture. No overt LOB, CGA for safety   Stairs             Wheelchair Mobility     Tilt Bed    Modified Rankin (Stroke Patients Only)       Balance Overall balance assessment: Needs assistance Sitting-balance support: No upper extremity supported, Feet supported Sitting balance-Leahy Scale: Good     Standing balance support: Bilateral upper extremity supported, During functional activity, Reliant on assistive device for balance Standing balance-Leahy Scale: Poor Standing balance comment: Reliant on rollator when standing/ambulating  Cognition Arousal: Alert Behavior During Therapy: WFL for tasks assessed/performed Overall Cognitive Status: Within Functional Limits for tasks assessed                                          Exercises Other Exercises Other Exercises: > 5x sit  <> stand from recliner while utilizing UEs, cues for quads activation and eccentric control (poor eccentric control noted), CGA for safety    General Comments General comments (skin integrity, edema, etc.): BP-108/81 sitting, 74/44 standing (he denied lightheadedness but reported his "legs just got weak" and needed to sit), 132/108 (117) while ambulating (unsure of accuracy due to grip on rollator), 83/50 (60) while standing after he finished ambulating, 97/61 (73) sitting end of session - RN and MD notified; educated pt on changing positions slowly/gradually and awareness of signs/symptoms of orthostatic hypotension and to perform AROM of extremities as needed to try to help his BP      Pertinent Vitals/Pain Pain Assessment Pain Assessment: Faces Faces Pain Scale: No hurt Pain Intervention(s): Monitored during session    Home Living                          Prior Function            PT Goals (current goals can now be found in the care plan section) Acute Rehab PT Goals Patient Stated Goal: to get stronger PT Goal Formulation: With patient Time For Goal Achievement: 04/03/23 Potential to Achieve Goals: Good Progress towards PT goals: Progressing toward goals    Frequency    Min 1X/week      PT Plan      Co-evaluation              AM-PAC PT "6 Clicks" Mobility   Outcome Measure  Help needed turning from your back to your side while in a flat bed without using bedrails?: None Help needed moving from lying on your back to sitting on the side of a flat bed without using bedrails?: None Help needed moving to and from a bed to a chair (including a wheelchair)?: A Little Help needed standing up from a chair using your arms (e.g., wheelchair or bedside chair)?: A Little Help needed to walk in hospital room?: A Little Help needed climbing 3-5 steps with a railing? : A Little 6 Click Score: 20    End of Session Equipment Utilized During Treatment: Gait  belt Activity Tolerance: Patient tolerated treatment well;Other (comment) (limited by drop in BP) Patient left: in chair;with call bell/phone within reach Nurse Communication: Mobility status;Other (comment) (BP - notified MD also) PT Visit Diagnosis: Unsteadiness on feet (R26.81);Muscle weakness (generalized) (M62.81);Other abnormalities of gait and mobility (R26.89)     Time: 1610-9604 PT Time Calculation (min) (ACUTE ONLY): 23 min  Charges:    $Gait Training: 8-22 mins $Therapeutic Activity: 8-22 mins PT General Charges $$ ACUTE PT VISIT: 1 Visit                     Virgil Benedict, PT, DPT Acute Rehabilitation Services  Office: [phone]    Bettina Gavia 03/31/2023, 5:12 PM

## 2023-03-31 NOTE — Progress Notes (Signed)
Rounding Note    Patient Name: [name] Date of Encounter: 03/31/2023  [facility] Cardiologist: Armanda Magic, MD   Subjective   Currently feeling well.  Raymont Andreoni switch to p.o. amiodarone yesterday.  Continue to have episodes of VT, appropriately treated with ATP.  Inpatient Medications    Scheduled Meds:  amiodarone  400 mg Oral BID   apixaban  2.5 mg Oral BID   atorvastatin  40 mg Oral Daily   carvedilol  25 mg Oral BID WC   Chlorhexidine Gluconate Cloth  6 each Topical Daily   famotidine  10 mg Oral QHS   furosemide  20 mg Oral Daily   icosapent Ethyl  2 g Oral BID   isosorbide mononitrate  30 mg Oral Daily   mexiletine  300 mg Oral Q12H   multivitamin with minerals  1 tablet Oral Daily   pantoprazole  40 mg Oral QAC breakfast   polyethylene glycol  17 g Oral Daily   senna-docusate  1 tablet Oral Daily   sodium bicarbonate  650 mg Oral Daily   sodium chloride flush  10-40 mL Intracatheter Q12H   Continuous Infusions:   PRN Meds: acetaminophen, albuterol, nitroGLYCERIN, mouth rinse, sodium chloride flush   Vital Signs    Vitals:   03/31/23 0300 03/31/23 0400 03/31/23 0700 03/31/23 0743  BP: (!) 106/55 121/64 120/74   Pulse: 77 76 76   Resp: 16 15 18    Temp:  98 F (36.7 C)  (!) 97.5 F (36.4 C)  TempSrc:  Oral  Oral  SpO2: 96% 97% 93%   Weight:      Height:        Intake/Output Summary (Last 24 hours) at 03/31/2023 0757 Last data filed at 03/31/2023 0400 Gross per 24 hour  Intake 313.05 ml  Output 1325 ml  Net -1011.95 ml      03/29/2023    5:00 AM 03/28/2023    5:30 AM 03/20/2023    6:00 AM  Last 3 Weights  Weight (lbs) 197 lb 5 oz 197 lb 5 oz 194 lb 0.1 oz  Weight (kg) 89.5 kg 89.5 kg 88 kg      Telemetry    Atrial fibrillation, VT with intermittent ATP, ventricular paced  ECG    None new  Physical Exam   GEN: No acute distress.   Neck: No JVD Cardiac: RRR, no murmurs, rubs, or gallops.  Respiratory: decreased  BS bases bilaterally. GI: Soft, nontender, non-distended  MS: No edema; No deformity. Neuro:  Nonfocal  Skin: warm and dry, device site well healed Psych: Normal affect    Labs    High Sensitivity Troponin:   Recent Labs  Lab 03/15/23 2020 03/15/23 2200 03/19/23 1052 03/19/23 1245  TROPONINIHS 16 11 21* 20*     Chemistry Recent Labs  Lab 03/27/23 0313 03/28/23 0312 03/29/23 0348 03/30/23 0428  NA 134* 133* 133* 132*  K 4.4 4.3 4.5 4.7  CL 102 101 101 100  CO2 23 23 23 23   GLUCOSE 145* 202* 138* 153*  BUN 39* 38* 41* 39*  CREATININE 2.02* 2.09* 2.17* 2.21*  CALCIUM 8.2* 8.0* 8.2* 8.4*  MG 2.2 2.2 2.3  --   GFRNONAA 31* 30* 29* 28*  ANIONGAP 9 9 9 9     Lipids No results for input(s): "CHOL", "TRIG", "HDL", "LABVLDL", "LDLCALC", "CHOLHDL" in the last 168 hours.  Hematology Recent Labs  Lab 03/28/23 0313  WBC 9.3  RBC 3.95*  HGB 11.6*  HCT  35.8*  MCV 90.6  MCH 29.4  MCHC 32.4  RDW 13.8  PLT 260   Thyroid No results for input(s): "TSH", "FREET4" in the last 168 hours.  BNPNo results for input(s): "BNP", "PROBNP" in the last 168 hours.  DDimer No results for input(s): "DDIMER" in the last 168 hours.   Radiology    No results found.  Cardiac Studies     Patient Profile     89 y.o. male with chronic systolic heart failure and coronary disease presented to the hospital with VT storm  Assessment & Plan    1.  VT storm: Patient on amiodarone and mexiletine.  He is continuing to have episodes of ventricular tachycardia.  [name] switch to p.o. amiodarone with a slight increase in VT burden.  Patient has no other options for therapy.  [name] continue to have conversations with his family about CODE STATUS.  [name] plan for hopefully discharge in the next few days.  Patient does understand that he [name] continue to have VT with ATP at home.  2.  Permanent atrial fibrillation: Continue Eliquis  3.  Chronic systolic heart failure: No obvious volume overload.   Continue home medications  4.  Coronary artery disease: No current chest pain.  Continue antianginals.  5.  Bioprosthetic AVR: Functioning appropriately on most recent echo  6.  CKD stage IV: Stable   For questions or updates, please contact [facility] Please consult [email or web address] for contact info under        Signed, Mattheo Swindle Jorja Loa, MD  03/31/2023, 7:57 AM

## 2023-04-01 DIAGNOSIS — Z711 Person with feared health complaint in whom no diagnosis is made: Secondary | ICD-10-CM

## 2023-04-01 DIAGNOSIS — Z789 Other specified health status: Secondary | ICD-10-CM

## 2023-04-01 DIAGNOSIS — I472 Ventricular tachycardia, unspecified: Secondary | ICD-10-CM | POA: Diagnosis not present

## 2023-04-01 LAB — RENAL FUNCTION PANEL
Albumin: 2.8 g/dL — ABNORMAL LOW (ref 3.5–5.0)
Anion gap: 9 (ref 5–15)
BUN: 42 mg/dL — ABNORMAL HIGH (ref 8–23)
CO2: 23 mmol/L (ref 22–32)
Calcium: 8.5 mg/dL — ABNORMAL LOW (ref 8.9–10.3)
Chloride: 101 mmol/L (ref 98–111)
Creatinine, Ser: 2.41 mg/dL — ABNORMAL HIGH (ref 0.61–1.24)
GFR, Estimated: 25 mL/min — ABNORMAL LOW (ref >60)
Glucose, Bld: 109 mg/dL — ABNORMAL HIGH (ref 70–99)
Phosphorus: 4.9 mg/dL — ABNORMAL HIGH (ref 2.5–4.6)
Potassium: 4.5 mmol/L (ref 3.5–5.1)
Sodium: 133 mmol/L — ABNORMAL LOW (ref 135–145)

## 2023-04-01 LAB — MAGNESIUM: Magnesium: 2.4 mg/dL (ref 1.7–2.4)

## 2023-04-01 NOTE — TOC CM/SW Note (Signed)
Patient will need a bedside commode due to his bathroom is not located in his bedroom and ambulating to bathroom is a high risk for for falls due to his physical deconditioning, gait instability and frequency need to use bathroom.

## 2023-04-01 NOTE — Progress Notes (Addendum)
Rounding Note    Patient Name: [name] Date of Encounter: 04/01/2023  [facility] Cardiologist: Armanda Magic, MD   Subjective   Feels better today, working with therapy Little SOB about 5am but feels ok at this time.  He denies chest pain, chest pressure, palpitations  Inpatient Medications    Scheduled Meds:  amiodarone  400 mg Oral BID   apixaban  2.5 mg Oral BID   atorvastatin  40 mg Oral Daily   carvedilol  12.5 mg Oral BID WC   Chlorhexidine Gluconate Cloth  6 each Topical Daily   famotidine  10 mg Oral QHS   furosemide  20 mg Oral Daily   icosapent Ethyl  2 g Oral BID   mexiletine  300 mg Oral Q12H   multivitamin with minerals  1 tablet Oral Daily   pantoprazole  40 mg Oral QAC breakfast   polyethylene glycol  17 g Oral Daily   senna-docusate  1 tablet Oral Daily   sodium bicarbonate  650 mg Oral Daily   sodium chloride flush  10-40 mL Intracatheter Q12H   Continuous Infusions:   PRN Meds: acetaminophen, albuterol, nitroGLYCERIN, mouth rinse, sodium chloride flush   Vital Signs    Vitals:   04/01/23 0600 04/01/23 0615 04/01/23 0630 04/01/23 0645  BP: 131/77 125/76 117/75 (!) 122/93  Pulse: 77 79 83 81  Resp: 15 16 15 12   Temp:      TempSrc:      SpO2: 97% 99% 96% 96%  Weight:      Height:        Intake/Output Summary (Last 24 hours) at 04/01/2023 0720 Last data filed at 04/01/2023 0400 Gross per 24 hour  Intake 20 ml  Output 1500 ml  Net -1480 ml      03/29/2023    5:00 AM 03/28/2023    5:30 AM 03/20/2023    6:00 AM  Last 3 Weights  Weight (lbs) 197 lb 5 oz 197 lb 5 oz 194 lb 0.1 oz  Weight (kg) 89.5 kg 89.5 kg 88 kg      Telemetry    Frequent VT in 120s Otherwise afib in 60s   ECG    None new  Physical Exam   GEN: No acute distress, A & O x 3, normal affect Neck: No JVD Cardiac: RRR, no murmurs, rubs, or gallops.  Respiratory: decreased in bases bilaterally. GI: Soft, nontender, non-distended  MS: No  edema; No deformity. Skin: warm and dry, device site well healed   Labs    High Sensitivity Troponin:   Recent Labs  Lab 03/15/23 2020 03/15/23 2200 03/19/23 1052 03/19/23 1245  TROPONINIHS 16 11 21* 20*     Chemistry Recent Labs  Lab 03/29/23 0348 03/30/23 0428 03/31/23 0740 04/01/23 0355  NA 133* 132* 134* 133*  K 4.5 4.7 4.9 4.5  CL 101 100 101 101  CO2 23 23 22 23   GLUCOSE 138* 153* 172* 109*  BUN 41* 39* 42* 42*  CREATININE 2.17* 2.21* 2.46* 2.41*  CALCIUM 8.2* 8.4* 8.7* 8.5*  MG 2.3  --  2.2 2.4  ALBUMIN  --   --   --  2.8*  GFRNONAA 29* 28* 25* 25*  ANIONGAP 9 9 11 9     Lipids No results for input(s): "CHOL", "TRIG", "HDL", "LABVLDL", "LDLCALC", "CHOLHDL" in the last 168 hours.  Hematology Recent Labs  Lab 03/28/23 0313  WBC 9.3  RBC 3.95*  HGB 11.6*  HCT 35.8*  MCV  90.6  MCH 29.4  MCHC 32.4  RDW 13.8  PLT 260   Thyroid No results for input(s): "TSH", "FREET4" in the last 168 hours.  BNPNo results for input(s): "BNP", "PROBNP" in the last 168 hours.  DDimer No results for input(s): "DDIMER" in the last 168 hours.   Radiology    No results found.  Cardiac Studies     Patient Profile     89 y.o. male with chronic systolic heart failure and coronary disease presented to the hospital with VT storm  Assessment & Plan    #) VT storm Continues to have frequent episodes of slow VT with rates in 120s Adjusted device settings with slow VT zone starting at 115; adjusted ATP settings to: - 6 series of 12 bursts, then - 6 series of 12 ramp Continue PO amiodarone 400mg  BID and 300mg  BID mexiletine, tolerating both well No other medication options Continue to have conversations with his family about CODE STATUS.  Elridge Stemm plan for hopefully discharge in the next few days.  Patient does understand that he Yoshino Broccoli continue to have VT with ATP at home.  #) Permanent atrial fibrillation Continue Eliquis  #) Chronic systolic heart failure No obvious volume  overload.  Continue home medications  #) Coronary artery disease:  No current chest pain.  Continue antianginals.  #) Bioprosthetic AVR Functioning appropriately on most recent echo  #) CKD stage IV Cr Stable   For questions or updates, please contact [facility] Please consult [email or web address] for contact info under        Signed, Sherie Don, NP  04/01/2023, 7:20 AM    I have seen and examined this patient with Sherie Don.  Agree with above, note added to reflect my findings.  Patient feeling well.  Continuing to have ventricular tachycardia treated with ATP.  Ready to go home.  GEN: Well nourished, well developed, in no acute distress  HEENT: normal  Neck: no JVD, carotid bruits, or masses Cardiac: RRR; no murmurs, rubs, or gallops,no edema  Respiratory:  clear to auscultation bilaterally, normal work of breathing GI: soft, nontender, nondistended, + BS MS: no deformity or atrophy  Skin: warm and dry, device site well healed Neuro:  Strength and sensation are intact Psych: euthymic mood, full affect   Ventricular tachycardia: VT rate continues to slow.  Have adjusted VT zones with ATP at lower rates.  Continue amiodarone and mexiletine.  Likely discharge home tomorrow. Coronary artery disease: No current ischemia or pain Chronic systolic heart failure: No obvious volume overload CKD stage IV: Creatinine stable Bioprosthetic AVR: Stable on most recent echo Permanent atrial fibrillation: Continue anticoagulation  Bradlee Heitman M. Annasofia Pohl MD 04/01/2023 2:57 PM

## 2023-04-01 NOTE — Progress Notes (Signed)
Occupational Therapy Treatment Patient Details Name: [name] MRN: [id number] DOB: [birth year] Today's Date: 04/01/2023   History of present illness [name] is an 89 y.o. male admitted 12/10 with intermittent episodes of weakness, legs going out, worsening SOB, and ICD shock due to VT. PMH: HFrEF, CAD, ICM s/p CRT-D, AS s/p bioprosthetic AVR, perm AFib, CKD stage 4, hyperthyroidism, HTN, HLD   OT comments  Pt progressing toward established OT goals. BUE HEP provided with yellow level 1 theraband. Cues for optimal technique as well as upgrading/downgrading each exercise with appropriate hand placement. Able to perform oral care in standing afterward. Of not SBP 98 after session. Will continue to follow acutely.       If plan is discharge home, recommend the following:  A little help with bathing/dressing/bathroom;Assistance with cooking/housework;Assist for transportation;Help with stairs or ramp for entrance   Equipment Recommendations  BSC/3in1    Recommendations for Other Services      Precautions / Restrictions Precautions Precautions: Fall Precaution Comments: watch SpO2 & BP Restrictions Weight Bearing Restrictions Per Provider Order: No       Mobility Bed Mobility               General bed mobility comments: in chair    Transfers Overall transfer level: Needs assistance Equipment used: Rolling walker (2 wheels) Transfers: Sit to/from Stand Sit to Stand: Supervision                 Balance Overall balance assessment: Needs assistance Sitting-balance support: No upper extremity supported, Feet supported Sitting balance-Leahy Scale: Good     Standing balance support: Bilateral upper extremity supported, During functional activity, Reliant on assistive device for balance Standing balance-Leahy Scale: Poor Standing balance comment: reliant on BUE support                           ADL either performed or assessed with clinical judgement    ADL Overall ADL's : Needs assistance/impaired     Grooming: Supervision/safety;Standing;Oral care;Wash/dry hands Grooming Details (indicate cue type and reason): standing at sink; CGA approaching supervision by end of session. Initially mild sway                               General ADL Comments: BUE HEP implemented and handout provided    Extremity/Trunk Assessment Upper Extremity Assessment Upper Extremity Assessment: Generalized weakness   Lower Extremity Assessment Lower Extremity Assessment: Defer to PT evaluation        Vision   Vision Assessment?: No apparent visual deficits   Perception Perception Perception: Not tested   Praxis Praxis Praxis: Not tested    Cognition Arousal: Alert Behavior During Therapy: [facility] for tasks assessed/performed Overall Cognitive Status: Within Functional Limits for tasks assessed                                 General Comments: hard of hearing        Exercises Exercises: Other exercises Other Exercises Other Exercises: BUE with yellow theraband (could likely be upgraded in a few days but pt reports he has more resistive Tband at home). BUE scapular row, BUE shoulder external rotation, forward punches, and shoulder flexion. pt with good tolerance 15 reps each; row completed twice with pull and hold for 3 seconds each rep (handout provided) Other Exercises: chair push up  x 12 and needing break    Shoulder Instructions       General Comments BP 98/54 at end of sesion with HR 10    Pertinent Vitals/ Pain       Pain Assessment Pain Assessment: No/denies pain  Home Living                                          Prior Functioning/Environment              Frequency  Min 1X/week        Progress Toward Goals  OT Goals(current goals can now be found in the care plan section)  Progress towards OT goals: Progressing toward goals  Acute Rehab OT Goals Patient Stated  Goal: go home OT Goal Formulation: With patient Time For Goal Achievement: 04/09/23 Potential to Achieve Goals: Good ADL Goals Pt Will Perform Lower Body Dressing: with supervision;sitting/lateral leans;sit to/from stand Pt Will Transfer to Toilet: with supervision;ambulating Pt/caregiver will Perform Home Exercise Program: Increased strength;With theraband;Both right and left upper extremity Additional ADL Goal #1: Pt will gather supplies needed to perform dressing with supervision  Plan      Co-evaluation                 AM-PAC OT "6 Clicks" Daily Activity     Outcome Measure   Help from another person eating meals?: None Help from another person taking care of personal grooming?: A Little Help from another person toileting, which includes using toliet, bedpan, or urinal?: A Little Help from another person bathing (including washing, rinsing, drying)?: A Little Help from another person to put on and taking off regular upper body clothing?: A Little Help from another person to put on and taking off regular lower body clothing?: A Little 6 Click Score: 19    End of Session Equipment Utilized During Treatment: Gait belt;Rolling walker (2 wheels)  OT Visit Diagnosis: Unsteadiness on feet (R26.81);Muscle weakness (generalized) (M62.81)   Activity Tolerance Patient tolerated treatment well   Patient Left in chair;with call bell/phone within reach   Nurse Communication Mobility status        Time: 8295-6213 OT Time Calculation (min): 22 min  Charges: OT General Charges $OT Visit: 1 Visit OT Treatments $Self Care/Home Management : 8-22 mins  Tyler Deis, OTR/L [facility] Acute Rehabilitation Office: [phone]   Myrla Halsted 04/01/2023, 10:49 AM

## 2023-04-01 NOTE — Progress Notes (Signed)
Physical Therapy Treatment Patient Details Name: [name] MRN: [id number] DOB: 11-22-1932 Today's Date: 04/01/2023   History of Present Illness [name] is an 89 y.o. male admitted 12/10 with intermittent episodes of weakness, legs going out, worsening SOB, and ICD shock due to VT. PMH: HFrEF, CAD, ICM s/p CRT-D, AS s/p bioprosthetic AVR, perm AFib, CKD stage 4, hyperthyroidism, HTN, HLD    PT Comments  Pt very pleasant and eager to return home. Pt continues to be able to walk in hall with rollator limited by SOB with SPO2 88-95% on RA with difficult SPO2 reading at times. Pt performed sitting and standing HEP with drop in BP with standing trials but asymptomatic. HHPT remains appropriate and encouraged mobility with nursing staff daily.   BP supine 127/62 (82) Standing 76/58 (66) Standing after HEP 92/57 (70) Sitting after initial gait 84/65 (69) Pt asymptomatic with drop in BP    If plan is discharge home, recommend the following: A little help with walking and/or transfers;A little help with bathing/dressing/bathroom;Assist for transportation;Help with stairs or ramp for entrance;Assistance with cooking/housework   Can travel by private vehicle        Equipment Recommendations  None recommended by PT    Recommendations for Other Services       Precautions / Restrictions Precautions Precautions: Fall Precaution Comments: watch SpO2 & BP Restrictions Weight Bearing Restrictions Per Provider Order: No     Mobility  Bed Mobility Overal bed mobility: Modified Independent                  Transfers Overall transfer level: Needs assistance   Transfers: Sit to/from Stand Sit to Stand: Supervision           General transfer comment: pt able to stand from bed, toilet and rollator with cues for use of brakes    Ambulation/Gait Ambulation/Gait assistance: Supervision Gait Distance (Feet): 200 Feet Assistive device: Rollator (4 wheels) Gait  Pattern/deviations: Step-through pattern, Decreased stride length, Trunk flexed   Gait velocity interpretation: 1.31 - 2.62 ft/sec, indicative of limited community ambulator   General Gait Details: cues for posture and activity tolerance. Pt walked 200' x 2 trials with seated rest on rollator between trials as well as short distance 30'   Stairs             Wheelchair Mobility     Tilt Bed    Modified Rankin (Stroke Patients Only)       Balance Overall balance assessment: Needs assistance Sitting-balance support: No upper extremity supported, Feet supported Sitting balance-Leahy Scale: Good     Standing balance support: Bilateral upper extremity supported, During functional activity, Reliant on assistive device for balance Standing balance-Leahy Scale: Poor Standing balance comment: Reliant on rollator when standing/ambulating                            Cognition Arousal: Alert Behavior During Therapy: WFL for tasks assessed/performed Overall Cognitive Status: Within Functional Limits for tasks assessed                                 General Comments: [facility]        Exercises General Exercises - Lower Extremity Long Arc Quad: AROM, Both, Seated, 20 reps Hip Flexion/Marching: AROM, Both, Seated, 20 reps (seated x 20, standing x 10) Other Exercises Other Exercises: standing hamstring curl x 5 bil, AROM  General Comments        Pertinent Vitals/Pain Pain Assessment Pain Assessment: No/denies pain    Home Living                          Prior Function            PT Goals (current goals can now be found in the care plan section) Progress towards PT goals: Progressing toward goals    Frequency    Min 1X/week      PT Plan      Co-evaluation              AM-PAC PT "6 Clicks" Mobility   Outcome Measure  Help needed turning from your back to your side while in a flat bed without using bedrails?:  None Help needed moving from lying on your back to sitting on the side of a flat bed without using bedrails?: None Help needed moving to and from a bed to a chair (including a wheelchair)?: None Help needed standing up from a chair using your arms (e.g., wheelchair or bedside chair)?: A Little Help needed to walk in hospital room?: A Little Help needed climbing 3-5 steps with a railing? : A Little 6 Click Score: 21    End of Session   Activity Tolerance: Patient tolerated treatment well Patient left: in chair;with call bell/phone within reach Nurse Communication: Mobility status PT Visit Diagnosis: Unsteadiness on feet (R26.81);Muscle weakness (generalized) (M62.81);Other abnormalities of gait and mobility (R26.89)     Time: 4098-1191 PT Time Calculation (min) (ACUTE ONLY): 21 min  Charges:    $Gait Training: 8-22 mins PT General Charges $$ ACUTE PT VISIT: 1 Visit                     Merryl Hacker, PT Acute Rehabilitation Services Office: [phone]    Cristine Polio 04/01/2023, 8:39 AM

## 2023-04-01 NOTE — TOC Progression Note (Addendum)
Transition of Care [facility]) - Progression Note    Patient Details  Name: [name] MRN: [id number] Date of Birth: 12-18-1932  Transition of Care [facility]) CM/SW Contact  Elliot Cousin, RN Phone Number: [phone] 04/01/2023, 4:48 PM  Clinical Narrative:    CM spoke to pt and plan is dc tomorrow. Gave permission to contact his dtr. Contacted his PCP and they will call him with a follow up hospital appt. Contacted dtr, and offered choice for outpatient palliative (medicare.gov list placed on chart), dtr agreeable to [facility]. Referral sent to rep, Cheri.  Contacted [facility] to make aware of dc home tomorrow with [facility].  Contacted [facility] for bedside commode for home.    Expected Discharge Plan: Home w Home Health Services Barriers to Discharge: No Barriers Identified  Expected Discharge Plan and Services   Discharge Planning Services: CM Consult Post Acute Care Choice: Home Health Living arrangements for the past 2 months: Single Family Home                           HH Arranged: RN, PT [facility] Agency: [facility] Date [facility] Agency Contacted: 04/01/23 Time HH Agency Contacted: 1647 Representative spoke with at [facility] Agency: Hassel Neth   Social Determinants of Health (SDOH) Interventions SDOH Screenings   Food Insecurity: No Food Insecurity (03/19/2023)  Housing: Low Risk  (03/21/2023)  Transportation Needs: No Transportation Needs (03/19/2023)  Utilities: Not At Risk (03/19/2023)  Tobacco Use: Low Risk  (03/19/2023)    Readmission Risk Interventions     No data to display

## 2023-04-01 NOTE — Progress Notes (Signed)
Brief Palliative Medicine Progress Note:  PMT following peripherally for needs/decline:  Medical records reviewed including progress notes, labs, imaging. He continues to have episodes of VT with no other therapy options. Per EP, patient understands he will continue to have VT with ATP at home.  Goals are clear for full code/full scope care at this time. Anticipate discharge within the next few days per EP.  Patient would benefit from ongoing GOC, especially for code status. TOC consult placed for: outpatient Palliative Care referral.  PMT will follow peripherally and visit with patient and family incrementally for goals of care discussions as appropriate and based on clinical course. If there are any imminent needs please call the service directly.   Thank you for allowing PMT to assist in the care of this patient.  [name] M. Katrinka Blazing [name] Palliative Medicine Team Team Phone: [phone] NO CHARGE

## 2023-04-01 NOTE — Plan of Care (Signed)

## 2023-04-02 ENCOUNTER — Other Ambulatory Visit (HOSPITAL_COMMUNITY): Payer: Self-pay

## 2023-04-02 DIAGNOSIS — I472 Ventricular tachycardia, unspecified: Secondary | ICD-10-CM | POA: Diagnosis not present

## 2023-04-02 LAB — RENAL FUNCTION PANEL
Albumin: 2.8 g/dL — ABNORMAL LOW (ref 3.5–5.0)
Anion gap: 7 (ref 5–15)
BUN: 41 mg/dL — ABNORMAL HIGH (ref 8–23)
CO2: 25 mmol/L (ref 22–32)
Calcium: 8.3 mg/dL — ABNORMAL LOW (ref 8.9–10.3)
Chloride: 104 mmol/L (ref 98–111)
Creatinine, Ser: 2.27 mg/dL — ABNORMAL HIGH (ref 0.61–1.24)
GFR, Estimated: 27 mL/min — ABNORMAL LOW (ref >60)
Glucose, Bld: 111 mg/dL — ABNORMAL HIGH (ref 70–99)
Phosphorus: 4.5 mg/dL (ref 2.5–4.6)
Potassium: 4.3 mmol/L (ref 3.5–5.1)
Sodium: 136 mmol/L (ref 135–145)

## 2023-04-02 LAB — MAGNESIUM: Magnesium: 2.4 mg/dL (ref 1.7–2.4)

## 2023-04-02 MED ORDER — MEXILETINE HCL 150 MG PO CAPS
300.0000 mg | ORAL_CAPSULE | Freq: Two times a day (BID) | ORAL | 12 refills | Status: DC
  Filled 2023-04-02: qty 120, 30d supply, fill #0

## 2023-04-02 MED ORDER — AMIODARONE HCL 200 MG PO TABS
400.0000 mg | ORAL_TABLET | Freq: Two times a day (BID) | ORAL | 1 refills | Status: DC
  Filled 2023-04-02: qty 120, 30d supply, fill #0

## 2023-04-02 MED ORDER — FAMOTIDINE 40 MG PO TABS: 20.0000 mg | ORAL_TABLET | Freq: Every evening | ORAL | Status: DC

## 2023-04-02 NOTE — Progress Notes (Signed)
PICC Removal Note: PICC line removed from RUE. PICC catheter tip visualized and intact. Pressure held until hemostasis achieved. Pressure dressing applied. No redness, ecchymosis, edema, swelling, or drainage noted at site. Instructions provided on post PICC discharge care, including followup notification instructions.  Bedrest until 0945.

## 2023-04-02 NOTE — Discharge Summary (Cosign Needed Addendum)
ELECTROPHYSIOLOGY DISCHARGE SUMMARY    Patient ID: [name],  MRN: [id number], DOB/AGE: 1932/11/02 89 y.o.  Admit date: 03/19/2023 Discharge date: 04/02/2023  Primary Care Physician: [name] Rua, MD  Primary Cardiologist: [name] Magic, MD  Electrophysiologist: Dr. Elberta [name]   Primary Discharge Diagnosis:  VT Storm  Secondary Discharge Diagnosis:  Permanent AF Chronic systolic CHF CAD Bioprosthetic AVR CKD IV  Brief HPI: [name] is a 89 y.o. male with a history of HFrEF, CAD, ICM s/p CRT-D, AS s/p bioprosthetic AVR, perm AFib, CKD stage 4, hyperthyroid, HTN, and HLD admitted for VT storm and multiple VT therapies.   Hospital Course:  The patient was previously seen 12/7 with complaints of weakness and dizziness and felt to be orthostatic, though device interrogation showed 3 episodes of VT treated with ATP.  Seen in clinic 12/10 and on the way to the visit had lightheadedness, dizziness, and vision going black. Device interrogation demonstrated VT storm requiring ATP multiple times and he was sent to the ED for further work up and management.   Sotalol was stopped due to ineffectiveness, and transitioned to amiodarone for load. Mexiletine increased as well.  He continued to have ATP despite amiodarone, requiring reloading with IV.   Due to his advanced age and co-morbidities, he was not felt to be a candidate for invasive EP procedures. Device required reprogramming several times due to slower and slower VT, ultimately programming his lower zone to ATP at 115 bpm.   Goals of care conversation were visited. Pt and family wished to continue full code and full scope of care. The patient did verbalize understanding that there are no further options for escalation of his care.   Pt did have profound hypotension which led to cessation of his BB.   Ultimately, the patients goal was to make it home for the holiday. He was evaluated and deemed to need [facility] RN and PT  which were arranged. He was monitored on telemetry which only showed rare NSVT the evening prior to discharge. He was examined by Dr. Elberta [name] and felt to be stable for discharge.   He was scheduled for 1 month follow up and is aware to call with further symptoms or concerns.    Physical Exam: Vitals:   04/02/23 0630 04/02/23 0700 04/02/23 0730 04/02/23 0808  BP: 129/65 (!) 85/74 124/68   Pulse: 74 75 75   Resp: 11 (!) 22 13   Temp:    97.8 F (36.6 C)  TempSrc:    Oral  SpO2: 97% 98% 97%   Weight:      Height:        GEN- NAD. A&O x 3.  HEENT: Normocephalic, atraumatic Lungs- CTAB, Normal effort.  Heart- RRR, No M/G/R.  GI- Soft, NT, ND.  Extremities- No clubbing, cyanosis, or edema; Groin without hematoma   Discharge Medications:  Allergies as of 04/02/2023       Reactions   Ranolazine Other (See Comments)   States he went into a coma and was in hospital for 3 days after taking   Procaine Swelling, Other (See Comments)   Amiodarone Nausea And Vomiting, Other (See Comments)        Medication List     STOP taking these medications    carvedilol 25 MG tablet Commonly known as: COREG   isosorbide mononitrate 30 MG 24 hr tablet Commonly known as: IMDUR       TAKE these medications    acetaminophen 500 MG tablet Commonly  known as: TYLENOL Take 500 mg by mouth as needed for moderate pain (pain score 4-6), headache or mild pain (pain score 1-3).   albuterol 108 (90 Base) MCG/ACT inhaler Commonly known as: VENTOLIN HFA Inhale 2 puffs into the lungs every 6 (six) hours as needed for wheezing or shortness of breath.   amiodarone 200 MG tablet Commonly known as: PACERONE Take 2 tablets (400 mg total) by mouth 2 (two) times daily.   amoxicillin 500 MG capsule Commonly known as: AMOXIL Take 4 capsules (2,000mg ) by mouth 30-60 minutes prior to dental work/cleanings.   atorvastatin 40 MG tablet Commonly known as: LIPITOR Take 1 tablet (40 mg total) by mouth  daily. What changed: when to take this   Eliquis 2.5 MG Tabs tablet Generic drug: apixaban TAKE 1 TABLET TWICE DAILY   famotidine 40 MG tablet Commonly known as: PEPCID Take 0.5 tablets (20 mg total) by mouth every evening. What changed: how much to take   furosemide 40 MG tablet Commonly known as: LASIX TAKE 1 TABLET EVERY DAY AS NEEDED FOR EDEMA What changed: See the new instructions.   mexiletine 150 MG capsule Commonly known as: MEXITIL Take 2 capsules (300 mg total) by mouth every 12 (twelve) hours.   multivitamin with minerals Tabs tablet Take 1 tablet by mouth in the morning.   pantoprazole 40 MG tablet Commonly known as: PROTONIX Take 40 mg by mouth daily before breakfast.   sodium bicarbonate 650 MG tablet Take 650 mg by mouth in the morning.   SYSTANE OP Place 1 drop into both eyes as needed (dry/irritated eyes.).   Vascepa 1 g capsule Generic drug: icosapent Ethyl TAKE 2 CAPSULES BY MOUTH 2 TIMES DAILY.               Durable Medical Equipment  (From admission, onward)           Start     Ordered   04/01/23 1701  For home use only DME Bedside commode  Once       Comments: 3n1  Question Answer Comment  Patient needs a bedside commode to treat with the following condition Physical deconditioning   Patient needs a bedside commode to treat with the following condition Heart failure (HCC)      04/01/23 1700            Disposition: Home with follow up as in AVS  Duration of Discharge Encounter: Greater than 30 minutes including physician time.  Signed, [name] Freer, PA-C  04/02/2023 11:20 AM   I have seen and examined this patient with [name].  Agree with above, note added to reflect my findings.  Patient presented to hospital with VT storm.  Had multiple episodes of ventricular tachycardia treated with ATP.  He is currently on mexiletine.  He was loaded with amiodarone.  He continued to have episodes of ventricular  tachycardia with successful ATP therapy.  Patient understands that he [name] go home and continue to have VT episodes.  Have had long discussions with the patient on CODE STATUS and ICD status.  Patient remains full code and wishes to keep ICD therapies on.  GEN: Well nourished, well developed, in no acute distress  HEENT: normal  Neck: no JVD, carotid bruits, or masses Cardiac: RRR; no murmurs, rubs, or gallops,no edema  Respiratory:  clear to auscultation bilaterally, normal work of breathing GI: soft, nontender, nondistended, + BS MS: no deformity or atrophy  Skin: warm and dry, device site well healed Neuro:  Strength and sensation are intact Psych: euthymic mood, full affect     Lee Kuang M. Evolett Somarriba MD 04/03/2023 9:56 AM

## 2023-04-04 ENCOUNTER — Telehealth: Payer: Self-pay

## 2023-04-04 ENCOUNTER — Other Ambulatory Visit: Payer: Self-pay

## 2023-04-04 MED ORDER — AMIODARONE HCL 200 MG PO TABS: 400.0000 mg | ORAL_TABLET | Freq: Two times a day (BID) | ORAL | 1 refills | Status: DC

## 2023-04-04 NOTE — Telephone Encounter (Signed)
Alert received from [employer] for 5 VT events, each converted successfully with 1 burst of ATP.   Pt had  hospital admission 03/19/23 to 04/02/23 for VT storm/multiple VT therapies delivered via ICD. Per hospital note " Sotalol was stopped due to ineffectiveness, and transitioned to amiodarone for load. Mexiletine increased as well. He continued to have ATP despite amiodarone, requiring reloading with IV. Due to his advanced age and co-morbidities, he was not felt to be a candidate for invasive EP procedures. Device required reprogramming several times due to slower and slower VT, ultimately programming his lower zone to ATP at 115 bpm."  Patient reports he currently had weakness which is unchanged since hospital admission. No new symptoms.  Wife is home with patient to assist with ADL's and medications.   Pt does not drive. Did advised per [place] DMV x6 months. Shock plan reviewed with pt. Pt voiced understanding.   Routing to Dr. Elberta Fortis for review.

## 2023-04-04 NOTE — Telephone Encounter (Signed)
Noted, thank you for follow up. 

## 2023-04-09 ENCOUNTER — Telehealth: Payer: Self-pay | Admitting: Cardiology

## 2023-04-09 ENCOUNTER — Telehealth: Payer: Self-pay

## 2023-04-09 ENCOUNTER — Encounter: Payer: Self-pay | Admitting: Cardiology

## 2023-04-09 NOTE — Telephone Encounter (Signed)
 That's going to depend on what type of alka-seltzer he took. Some of them have ASA in them which he should avoid because he is on Eliquis. He could try tums or gavascon, or just make sure the version he has isn't the original with aspirin in it.

## 2023-04-09 NOTE — Telephone Encounter (Signed)
 Call to patient to discuss his concerns.   Patient states he was in the hospital for VT 03/19/23-04/02/23. He states since he was released he has been nauseated every day, but last night he threw up.  Today, he was able to eat breakfast, but his nausea persisted so he took alka seltzer at about 12 pm. He states his nausea improved and he was able to eat lunch and has had no further episodes of vomiting.  He reports his BP is 99/79 at the time of my call and his HR is 80. He reports he has had no sick contacts, no upper respiratory symptoms. He reports he has been having bowel movements every few days which is normal for him. Patient is asking if it's ok for him to continue taking alka seltzer or if he should use another medication to control nausea. Forwarded to Pharm D for responses.

## 2023-04-09 NOTE — Telephone Encounter (Signed)
See my chart message. Nurse Alcario Drought responded to patient on my chart

## 2023-04-09 NOTE — Telephone Encounter (Signed)
 Called and spoke with patient to go over pharmD's recommendations. He states he will check his Alka-Seltzer to see if it contains aspirin . Told him to reach out to PCP because he states he was given a pill a while back when this happened and it took care of the nausea. In the meantime, advised that he try gaviscon.

## 2023-04-09 NOTE — Telephone Encounter (Signed)
Pt has been vomiting and wants to know what he can take for nausea

## 2023-04-10 ENCOUNTER — Telehealth: Payer: Self-pay | Admitting: Student

## 2023-04-10 NOTE — Telephone Encounter (Signed)
   Daughter called Answering Service (at patient's request) due to concerns of persistent nausea. Patient was recently admitted for VT storm and was load with Amiodarone  and Mexiletine. He continued to have VT requiring ATP despite this. Due to his advanced age and co-morbidities, he was not felt to be a candidate for invasive EP procedures. Device required reprogramming several times due to slower and slower VT, ultimately programming his lower zone to ATP at 115 bpm. Patient's goal was to make it home for the holidays so he was discharged. However, EP team had long discussion with patient explaining that he will continue ot have VT episodes. Long discussion regarding code status and ICD status were had.  Patient has had a lot of nausea with new medications. He has had one episode of vomiting as well and is having a lot of weakness today. Discussed with Dr. Nancey who is on call for EP who recommended decreasing Amiodarone  to 200mg  three times daily. Notified daughter of this recommendation who voiced understanding.  Patient has follow-up with Daphne Barrack, NP, on 05/01/2023. However, I will route note to EP scheduling pool to see if we can get him seen sooner. At recent discharge, home health RN/PT and he was referred to outpatient palliative care. However, daughter states home health services have not come by and they have not heard from the palliative care team. Will also route this to triage pool to see if they can follow up on this.   Neila Teem E Darrow Barreiro, PA-C 04/10/2023 12:18 PM

## 2023-04-11 ENCOUNTER — Telehealth: Payer: Self-pay | Admitting: Cardiology

## 2023-04-11 MED ORDER — SCOPOLAMINE 1 MG/3DAYS TD PT72: 1.0000 | MEDICATED_PATCH | TRANSDERMAL | 1 refills | Status: DC

## 2023-04-11 NOTE — Telephone Encounter (Signed)
 Pt c/o medication issue:  1. Name of Medication: amiodarone  (PACERONE ) 200 MG tablet   2. How are you currently taking this medication (dosage and times per day)? 1 tablet 3x a day   3. Are you having a reaction (difficulty breathing--STAT)? Yes  4. What is your medication issue? Patient's daughter is calling back stating the patient wants to come off of this medication completely due to being weaker today. She wants to know if he can go back on sotalol  in order to do so. Please advise.

## 2023-04-11 NOTE — Telephone Encounter (Signed)
 I spoke with patient's daughter.  Amiodarone  was decreased to 200 mg three times daily yesterday due to nausea.  Daughter reports patient told her his nausea is worse today.  He feels weak and almost fell yesterday.  Has been taking emetrol and Alka seltzer without ASA for the nausea.  Vomited 2 days ago but has not vomited since that time.  Weakness has progressing since he came home from the hospital recently.  Nausea started in the last few days.  I told daughter patient should not stop any medications and I would discuss with provider.  Daughter requests prior to doing this I contact patient for update on he is feeling I spoke with patient who reports nausea starts an hour or so after taking amiodarone . He does not think he can continue to take amiodarone . Noticed some twitching in his legs and his arms today. Feels very weak.  Became very weak when standing up today from the commode and had to sit back down. Will review with provider in the office.

## 2023-04-11 NOTE — Telephone Encounter (Signed)
 Reviewed with Daphne Barrack, NP and patient should continue current dose of amiodarone .  He should take pepcid  and protonix  as listed. Also take amiodarone  with meals. Can be scheduled for a sooner office visit. Per Daphne Barrack, NP can start  Scopolamine  patch 1 mg/3 days behind the ear every three days.  Remove old patch when applying new patch. Patient notified of instructions. Prescription sent [facility].  Appointment made for patient to see Jodie Passey, PA on January 6 at 9:20

## 2023-04-11 NOTE — Telephone Encounter (Signed)
 Called and spoke with daughter and offered appt for today with [place], [place]. Per Jodie, nothing would be altered from current plan at a visit today and he didn't feel pt should be seen sooner than 05/01/23, but if family wanted to, he would happily accommodate. Spoke with daughter who states they will keep current appt and will call if anything changes in the meantime.

## 2023-04-12 ENCOUNTER — Emergency Department (HOSPITAL_COMMUNITY): Payer: Medicare HMO

## 2023-04-12 ENCOUNTER — Inpatient Hospital Stay (HOSPITAL_COMMUNITY)
Admission: EM | Admit: 2023-04-12 | Discharge: 2023-04-22 | DRG: 309 | Disposition: A | Discharge status: Skilled Nursing Facility | Payer: Medicare HMO | Attending: Internal Medicine | Admitting: Internal Medicine

## 2023-04-12 ENCOUNTER — Encounter (HOSPITAL_COMMUNITY): Payer: Self-pay

## 2023-04-12 ENCOUNTER — Other Ambulatory Visit: Payer: Self-pay

## 2023-04-12 ENCOUNTER — Other Ambulatory Visit: Payer: Self-pay | Admitting: Cardiology

## 2023-04-12 DIAGNOSIS — H04129 Dry eye syndrome of unspecified lacrimal gland: Secondary | ICD-10-CM | POA: Diagnosis not present

## 2023-04-12 DIAGNOSIS — E860 Dehydration: Secondary | ICD-10-CM | POA: Diagnosis present

## 2023-04-12 DIAGNOSIS — M6281 Muscle weakness (generalized): Secondary | ICD-10-CM | POA: Diagnosis not present

## 2023-04-12 DIAGNOSIS — Z7401 Bed confinement status: Secondary | ICD-10-CM | POA: Diagnosis not present

## 2023-04-12 DIAGNOSIS — Z953 Presence of xenogenic heart valve: Secondary | ICD-10-CM | POA: Diagnosis not present

## 2023-04-12 DIAGNOSIS — I2510 Atherosclerotic heart disease of native coronary artery without angina pectoris: Secondary | ICD-10-CM | POA: Diagnosis present

## 2023-04-12 DIAGNOSIS — I700 Atherosclerosis of aorta: Secondary | ICD-10-CM | POA: Diagnosis not present

## 2023-04-12 DIAGNOSIS — T462X5A Adverse effect of other antidysrhythmic drugs, initial encounter: Secondary | ICD-10-CM | POA: Diagnosis present

## 2023-04-12 DIAGNOSIS — I4729 Other ventricular tachycardia: Secondary | ICD-10-CM | POA: Diagnosis not present

## 2023-04-12 DIAGNOSIS — R627 Adult failure to thrive: Secondary | ICD-10-CM | POA: Diagnosis present

## 2023-04-12 DIAGNOSIS — Y92009 Unspecified place in unspecified non-institutional (private) residence as the place of occurrence of the external cause: Secondary | ICD-10-CM

## 2023-04-12 DIAGNOSIS — I499 Cardiac arrhythmia, unspecified: Secondary | ICD-10-CM | POA: Diagnosis not present

## 2023-04-12 DIAGNOSIS — I472 Ventricular tachycardia, unspecified: Secondary | ICD-10-CM | POA: Diagnosis not present

## 2023-04-12 DIAGNOSIS — T443X5A Adverse effect of other parasympatholytics [anticholinergics and antimuscarinics] and spasmolytics, initial encounter: Secondary | ICD-10-CM | POA: Diagnosis not present

## 2023-04-12 DIAGNOSIS — I4821 Permanent atrial fibrillation: Secondary | ICD-10-CM | POA: Diagnosis present

## 2023-04-12 DIAGNOSIS — I509 Heart failure, unspecified: Secondary | ICD-10-CM | POA: Diagnosis not present

## 2023-04-12 DIAGNOSIS — Z951 Presence of aortocoronary bypass graft: Secondary | ICD-10-CM | POA: Diagnosis not present

## 2023-04-12 DIAGNOSIS — Z7901 Long term (current) use of anticoagulants: Secondary | ICD-10-CM

## 2023-04-12 DIAGNOSIS — Z515 Encounter for palliative care: Secondary | ICD-10-CM | POA: Diagnosis not present

## 2023-04-12 DIAGNOSIS — Z66 Do not resuscitate: Secondary | ICD-10-CM | POA: Diagnosis present

## 2023-04-12 DIAGNOSIS — I255 Ischemic cardiomyopathy: Secondary | ICD-10-CM | POA: Diagnosis present

## 2023-04-12 DIAGNOSIS — H9190 Unspecified hearing loss, unspecified ear: Secondary | ICD-10-CM | POA: Diagnosis present

## 2023-04-12 DIAGNOSIS — I4891 Unspecified atrial fibrillation: Secondary | ICD-10-CM | POA: Diagnosis not present

## 2023-04-12 DIAGNOSIS — E785 Hyperlipidemia, unspecified: Secondary | ICD-10-CM | POA: Diagnosis present

## 2023-04-12 DIAGNOSIS — D6869 Other thrombophilia: Secondary | ICD-10-CM | POA: Diagnosis not present

## 2023-04-12 DIAGNOSIS — R54 Age-related physical debility: Secondary | ICD-10-CM | POA: Diagnosis not present

## 2023-04-12 DIAGNOSIS — R531 Weakness: Secondary | ICD-10-CM | POA: Diagnosis not present

## 2023-04-12 DIAGNOSIS — Z4502 Encounter for adjustment and management of automatic implantable cardiac defibrillator: Secondary | ICD-10-CM

## 2023-04-12 DIAGNOSIS — R112 Nausea with vomiting, unspecified: Secondary | ICD-10-CM | POA: Diagnosis present

## 2023-04-12 DIAGNOSIS — E042 Nontoxic multinodular goiter: Secondary | ICD-10-CM | POA: Diagnosis not present

## 2023-04-12 DIAGNOSIS — R2681 Unsteadiness on feet: Secondary | ICD-10-CM | POA: Diagnosis not present

## 2023-04-12 DIAGNOSIS — R000 Tachycardia, unspecified: Secondary | ICD-10-CM | POA: Diagnosis not present

## 2023-04-12 DIAGNOSIS — I5022 Chronic systolic (congestive) heart failure: Secondary | ICD-10-CM | POA: Diagnosis present

## 2023-04-12 DIAGNOSIS — R0602 Shortness of breath: Secondary | ICD-10-CM | POA: Diagnosis not present

## 2023-04-12 DIAGNOSIS — Z8249 Family history of ischemic heart disease and other diseases of the circulatory system: Secondary | ICD-10-CM

## 2023-04-12 DIAGNOSIS — I130 Hypertensive heart and chronic kidney disease with heart failure and stage 1 through stage 4 chronic kidney disease, or unspecified chronic kidney disease: Secondary | ICD-10-CM | POA: Diagnosis not present

## 2023-04-12 DIAGNOSIS — Z961 Presence of intraocular lens: Secondary | ICD-10-CM | POA: Diagnosis present

## 2023-04-12 DIAGNOSIS — E1122 Type 2 diabetes mellitus with diabetic chronic kidney disease: Secondary | ICD-10-CM | POA: Diagnosis not present

## 2023-04-12 DIAGNOSIS — Z79899 Other long term (current) drug therapy: Secondary | ICD-10-CM

## 2023-04-12 DIAGNOSIS — I420 Dilated cardiomyopathy: Secondary | ICD-10-CM | POA: Diagnosis present

## 2023-04-12 DIAGNOSIS — R41841 Cognitive communication deficit: Secondary | ICD-10-CM | POA: Diagnosis not present

## 2023-04-12 DIAGNOSIS — Z9842 Cataract extraction status, left eye: Secondary | ICD-10-CM

## 2023-04-12 DIAGNOSIS — E875 Hyperkalemia: Secondary | ICD-10-CM | POA: Diagnosis not present

## 2023-04-12 DIAGNOSIS — E440 Moderate protein-calorie malnutrition: Secondary | ICD-10-CM | POA: Diagnosis present

## 2023-04-12 DIAGNOSIS — Z818 Family history of other mental and behavioral disorders: Secondary | ICD-10-CM

## 2023-04-12 DIAGNOSIS — Z888 Allergy status to other drugs, medicaments and biological substances status: Secondary | ICD-10-CM

## 2023-04-12 DIAGNOSIS — I10 Essential (primary) hypertension: Secondary | ICD-10-CM | POA: Diagnosis not present

## 2023-04-12 DIAGNOSIS — Z820 Family history of epilepsy and other diseases of the nervous system: Secondary | ICD-10-CM

## 2023-04-12 DIAGNOSIS — I4819 Other persistent atrial fibrillation: Secondary | ICD-10-CM | POA: Diagnosis not present

## 2023-04-12 DIAGNOSIS — N184 Chronic kidney disease, stage 4 (severe): Secondary | ICD-10-CM | POA: Diagnosis present

## 2023-04-12 DIAGNOSIS — R410 Disorientation, unspecified: Secondary | ICD-10-CM | POA: Diagnosis present

## 2023-04-12 DIAGNOSIS — E039 Hypothyroidism, unspecified: Secondary | ICD-10-CM | POA: Diagnosis present

## 2023-04-12 DIAGNOSIS — Z9841 Cataract extraction status, right eye: Secondary | ICD-10-CM

## 2023-04-12 DIAGNOSIS — I5042 Chronic combined systolic (congestive) and diastolic (congestive) heart failure: Secondary | ICD-10-CM | POA: Diagnosis present

## 2023-04-12 DIAGNOSIS — Z83438 Family history of other disorder of lipoprotein metabolism and other lipidemia: Secondary | ICD-10-CM

## 2023-04-12 DIAGNOSIS — Z825 Family history of asthma and other chronic lower respiratory diseases: Secondary | ICD-10-CM

## 2023-04-12 DIAGNOSIS — Z6826 Body mass index (BMI) 26.0-26.9, adult: Secondary | ICD-10-CM

## 2023-04-12 DIAGNOSIS — R262 Difficulty in walking, not elsewhere classified: Secondary | ICD-10-CM | POA: Diagnosis not present

## 2023-04-12 DIAGNOSIS — I493 Ventricular premature depolarization: Secondary | ICD-10-CM | POA: Diagnosis present

## 2023-04-12 DIAGNOSIS — Z7189 Other specified counseling: Secondary | ICD-10-CM | POA: Diagnosis not present

## 2023-04-12 DIAGNOSIS — R001 Bradycardia, unspecified: Secondary | ICD-10-CM | POA: Diagnosis not present

## 2023-04-12 DIAGNOSIS — Z800 Family history of malignant neoplasm of digestive organs: Secondary | ICD-10-CM

## 2023-04-12 DIAGNOSIS — R1310 Dysphagia, unspecified: Secondary | ICD-10-CM | POA: Diagnosis not present

## 2023-04-12 LAB — BASIC METABOLIC PANEL
Anion gap: 9 (ref 5–15)
BUN: 26 mg/dL — ABNORMAL HIGH (ref 8–23)
CO2: 24 mmol/L (ref 22–32)
Calcium: 8.2 mg/dL — ABNORMAL LOW (ref 8.9–10.3)
Chloride: 103 mmol/L (ref 98–111)
Creatinine, Ser: 2.15 mg/dL — ABNORMAL HIGH (ref 0.61–1.24)
GFR, Estimated: 29 mL/min — ABNORMAL LOW (ref >60)
Glucose, Bld: 116 mg/dL — ABNORMAL HIGH (ref 70–99)
Potassium: 5.5 mmol/L — ABNORMAL HIGH (ref 3.5–5.1)
Sodium: 136 mmol/L (ref 135–145)

## 2023-04-12 LAB — CBC WITH DIFFERENTIAL/PLATELET
Abs Immature Granulocytes: 0.02 10*3/uL (ref 0.00–0.07)
Basophils Absolute: 0.1 10*3/uL (ref 0.0–0.1)
Basophils Relative: 1 %
Eosinophils Absolute: 0 10*3/uL (ref 0.0–0.5)
Eosinophils Relative: 0 %
HCT: 45.7 % (ref 39.0–52.0)
Hemoglobin: 14.7 g/dL (ref 13.0–17.0)
Immature Granulocytes: 0 %
Lymphocytes Relative: 14 %
Lymphs Abs: 1.4 10*3/uL (ref 0.7–4.0)
MCH: 30.1 pg (ref 26.0–34.0)
MCHC: 32.2 g/dL (ref 30.0–36.0)
MCV: 93.5 fL (ref 80.0–100.0)
Monocytes Absolute: 1.5 10*3/uL — ABNORMAL HIGH (ref 0.1–1.0)
Monocytes Relative: 16 %
Neutro Abs: 6.4 10*3/uL (ref 1.7–7.7)
Neutrophils Relative %: 69 %
Platelets: 292 10*3/uL (ref 150–400)
RBC: 4.89 MIL/uL (ref 4.22–5.81)
RDW: 15.8 % — ABNORMAL HIGH (ref 11.5–15.5)
WBC: 9.4 10*3/uL (ref 4.0–10.5)
nRBC: 0 % (ref 0.0–0.2)

## 2023-04-12 LAB — BRAIN NATRIURETIC PEPTIDE: B Natriuretic Peptide: 597.6 pg/mL — ABNORMAL HIGH (ref 0.0–100.0)

## 2023-04-12 LAB — TROPONIN I (HIGH SENSITIVITY)
Troponin I (High Sensitivity): 26 ng/L — ABNORMAL HIGH (ref <18)
Troponin I (High Sensitivity): 31 ng/L — ABNORMAL HIGH (ref <18)

## 2023-04-12 LAB — MAGNESIUM: Magnesium: 2.4 mg/dL (ref 1.7–2.4)

## 2023-04-12 LAB — CBG MONITORING, ED: Glucose-Capillary: 123 mg/dL — ABNORMAL HIGH (ref 70–99)

## 2023-04-12 MED ORDER — AMIODARONE HCL IN DEXTROSE 360-4.14 MG/200ML-% IV SOLN
30.0000 mg/h | INTRAVENOUS | Status: DC
  Administered 2023-04-13 – 2023-04-14 (×3): 30 mg/h via INTRAVENOUS
  Filled 2023-04-12 (×3): qty 200

## 2023-04-12 MED ORDER — SODIUM ZIRCONIUM CYCLOSILICATE 10 G PO PACK
10.0000 g | PACK | ORAL | Status: AC
  Administered 2023-04-12: 10 g via ORAL
  Filled 2023-04-12: qty 1

## 2023-04-12 MED ORDER — FUROSEMIDE 10 MG/ML IJ SOLN
40.0000 mg | INTRAMUSCULAR | Status: AC
  Administered 2023-04-12: 40 mg via INTRAVENOUS
  Filled 2023-04-12: qty 4

## 2023-04-12 MED ORDER — AMIODARONE HCL IN DEXTROSE 360-4.14 MG/200ML-% IV SOLN
60.0000 mg/h | INTRAVENOUS | Status: AC
  Administered 2023-04-12: 60 mg/h via INTRAVENOUS
  Filled 2023-04-12: qty 200

## 2023-04-12 NOTE — ED Notes (Signed)
 Light Green top sent to lab

## 2023-04-12 NOTE — ED Notes (Signed)
 Admitting provider at bedside.

## 2023-04-12 NOTE — Telephone Encounter (Signed)
 [name], [name] Gladis, MD  [facility] [address] Triage; Aniceto Daphne CROME, NP; Gretel Maeola CROME, RN16 minutes ago (10:18 AM)   If patient continuing to feel nausea/weakness can reduce dose to 200 mg BID with meals. If nausea continues from there can reduce to daily.  Sotalol  not controlling arrhythmia and amiodarone /mexlietine likely best options for rhythm control.   Called and spoke with daughter [name] and advised her of above recommendations by [employer]. States the scopolamine  patch has helped tremendously and she [name] continue on current dose, but [name] taper if needed once patches are complete.

## 2023-04-12 NOTE — Consult Note (Addendum)
 Cardiology Consultation:   Patient ID: [name] MRN: [id number]; DOB: 1932-05-27  Admit date: 04/12/2023 Date of Consult: 04/12/2023  Primary Care Provider: Nanci Senior, MD Primary Cardiologist: Wilbert Bihari, MD  Primary Electrophysiologist:  Soyla Gladis Norton, MD    Patient Profile:   [name] is a 89 y.o. male with a hx of history of HFrEF, CAD, ICM s/p CRT-D, AS s/p bioprosthetic AVR, perm AFib, CKD stage 4, hyperthyroid, HTN, and HLD admitted for VT storm and multiple VT therapies who is being seen today for the evaluation of failure to thrive at the request of ED.  History of Present Illness:   [name] presents with nausea after recently being discharged with VT storm.  Was started on amiodarone  high-dose 3 times a day but was getting nauseous and called in at Per below note.  Amiodarone  was cut back and goals of care were discussed again.  01/01 Daughter called Answering Service (at patient's request) due to concerns of persistent nausea. Patient was recently admitted for VT storm and was load with Amiodarone  and Mexiletine. He continued to have VT requiring ATP despite this. Due to his advanced age and co-morbidities, he was not felt to be a candidate for invasive EP procedures. Device required reprogramming several times due to slower and slower VT, ultimately programming his lower zone to ATP at 115 bpm. Patient's goal was to make it home for the holidays so he was discharged. However, EP team had long discussion with patient explaining that he will continue ot have VT episodes. Long discussion regarding code status and ICD status were had.   Patient has had a lot of nausea with new medications. He has had one episode of vomiting as well and is having a lot of weakness today. Discussed with Dr. Nancey who is on call for EP who recommended decreasing Amiodarone  to 200mg  three times daily. Notified daughter of this recommendation who voiced understanding.  He  states his nausea has limited in his ability to eat and take the amiodarone  and currently is feeling better.  No other chest pain and no arrhythmias noted.  No shocks.   Past Medical History:  Diagnosis Date   Arthritis    minor everywhere (04/22/2015)   Asthma    a touch   Barrett esophagus    CAD (coronary artery disease)    a. s/p 3 vvessel CABG with LIMA to LAD, SVG to diagonal 1, SVG to RCA 12/09.   Cardiac resynchronization therapy defibrillator (CRT-D) in place    Chronic anticoagulation 01/23/2013   Chronic combined systolic and diastolic CHF (congestive heart failure) (HCC)    dry weight 197-200lbs.   CKD (chronic kidney disease) stage 3, GFR 30-59 ml/min (HCC) 08/23/2014   Complication of anesthesia    DCM (dilated cardiomyopathy) (HCC)    initially concerned for TTP amyloidosis but Tc-PYP scan 06/2017 was not consistent with amyloid and SPEP/UPEP with no M spike. EF 20-25% by echo 2019   Diverticulitis 08/2019   Dyslipidemia 01/23/2013   Essential hypertension 01/23/2013   GERD (gastroesophageal reflux disease)    GI bleed 05/08/2017   H/O: rheumatic fever    Hepatitis 1957   don't know what kind:   History of hiatal hernia    History of PFTs    a. Amiodarone  started 5/16 >> PFTs w/ DLCO 6/16:  FEV1 72% predicted, FEV1/FVC 66%, DLCO 66% >> minimal reversible obstructive airways disease with mild diffusion defect (suggestive of emphysema but absence of hyperinflation inconsistent with  dx)   Hyperkalemia 08/23/2014   Hyperthyroidism 05/26/2017   Leg weakness 01/28/2017   Multiple thyroid  nodules 08/13/2017   Orthostatic hypotension    Persistent atrial fibrillation (HCC) 01/23/2013   Pleural effusion on right 05/26/2017   Pneumonia 08/2014   dr thought I may have had a touch   Pre-diabetes    Protein-calorie malnutrition, severe 05/27/2017   S/P AVR (aortic valve replacement) 2009   a. severe AS s/p AVR with pericardial tissue valve 2009.   Shingles 08/23/2014   VT  (ventricular tachycardia) (HCC) 05/06/2017    Past Surgical History:  Procedure Laterality Date   AORTIC VALVE REPLACEMENT  with 23-mm Magna Ease pericardial valve, model number   with 23-mm Magna Ease pericardial valve, model number3300TFX, serial number [id number]    APPENDECTOMY  1940s   BI-VENTRICULAR IMPLANTABLE CARDIOVERTER DEFIBRILLATOR  (CRT-D)  04/22/2015   BIV ICD GENERATOR CHANGEOUT N/A 09/18/2022   Procedure: BIV ICD GENERATOR CHANGEOUT;  Surgeon: Inocencio Soyla Lunger, MD;  Location: [facility] INVASIVE CV LAB;  Service: Cardiovascular;  Laterality: N/A;   CARDIAC CATHETERIZATION N/A 11/04/2014   Procedure: Left Heart Cath and Cors/Grafts Angiography;  Surgeon: Alm LELON Clay, MD;  Location: [facility] INVASIVE CV LAB;  Service: Cardiovascular;  Laterality: N/A;   CARDIAC VALVE REPLACEMENT     CARDIOVERSION N/A 01/27/2013   Procedure: CARDIOVERSION;  Surgeon: Wilbert JONELLE Bihari, MD;  Location: [facility] ENDOSCOPY;  Service: Cardiovascular;  Laterality: N/A;   CARDIOVERSION N/A 08/25/2014   Procedure: CARDIOVERSION;  Surgeon: Jerel Balding, MD;  Location: [facility] ENDOSCOPY;  Service: Cardiovascular;  Laterality: N/A;   CARDIOVERSION N/A 05/28/2018   Procedure: CARDIOVERSION;  Surgeon: Mona Vinie BROCKS, MD;  Location: [facility] ENDOSCOPY;  Service: Cardiovascular;  Laterality: N/A;   CARDIOVERSION N/A 10/28/2019   Procedure: CARDIOVERSION;  Surgeon: Bihari Wilbert JONELLE, MD;  Location: [facility] ENDOSCOPY;  Service: Cardiovascular;  Laterality: N/A;   CARDIOVERSION N/A 09/01/2020   Procedure: CARDIOVERSION;  Surgeon: Pietro Redell RAMAN, MD;  Location: [facility] ENDOSCOPY;  Service: Cardiovascular;  Laterality: N/A;   CARDIOVERSION N/A 04/04/2022   Procedure: CARDIOVERSION;  Surgeon: Mona Vinie BROCKS, MD;  Location: [facility] ENDOSCOPY;  Service: Cardiovascular;  Laterality: N/A;   CATARACT EXTRACTION W/ INTRAOCULAR LENS  IMPLANT, BILATERAL Bilateral    CHEST TUBE INSERTION Right 07/26/2017   Procedure: INSERTION PLEURAL DRAINAGE CATHETER - RIGHT;   Surgeon: Fleeta Hanford Coy, MD;  Location: [facility] OR;  Service: Thoracic;  Laterality: Right;   CORONARY ARTERY BYPASS GRAFT  03/10/2008   x 3 Dr. Kerrin   EP IMPLANTABLE DEVICE N/A 04/22/2015   Procedure: BiV ICD Insertion CRT-D;  Surgeon: Will Lunger Inocencio, MD;  Location: [facility] INVASIVE CV LAB;  Service: Cardiovascular;  Laterality: N/A;   ESOPHAGOGASTRODUODENOSCOPY (EGD) WITH ESOPHAGEAL DILATION  X1   ESOPHAGOGASTRODUODENOSCOPY (EGD) WITH PROPOFOL  N/A 05/09/2017   Procedure: ESOPHAGOGASTRODUODENOSCOPY (EGD) WITH PROPOFOL ;  Surgeon: Rollin Dover, MD;  Location: [facility] ENDOSCOPY;  Service: Endoscopy;  Laterality: N/A;   IR THORACENTESIS ASP PLEURAL SPACE W/IMG GUIDE  05/27/2017   IR THORACENTESIS ASP PLEURAL SPACE W/IMG GUIDE  06/17/2017   IR THORACENTESIS ASP PLEURAL SPACE W/IMG GUIDE  06/18/2017   IR THORACENTESIS ASP PLEURAL SPACE W/IMG GUIDE  07/11/2017   REMOVAL OF PLEURAL DRAINAGE CATHETER Right 09/13/2017   Procedure: REMOVAL OF PLEURAL DRAINAGE CATHETER;  Surgeon: Fleeta Hanford Coy, MD;  Location: [facility] OR;  Service: Thoracic;  Laterality: Right;   RIGHT HEART CATH AND CORONARY/GRAFT ANGIOGRAPHY N/A 05/31/2017   Procedure: RIGHT HEART CATH AND CORONARY/GRAFT ANGIOGRAPHY;  Surgeon: Cherrie Sieving  R, MD;  Location: [facility] INVASIVE CV LAB;  Service: Cardiovascular;  Laterality: N/A;   TONSILLECTOMY       Home Medications:  Prior to Admission medications   Medication Sig Start Date End Date Taking? Authorizing Provider  acetaminophen  (TYLENOL ) 500 MG tablet Take 500 mg by mouth as needed for moderate pain (pain score 4-6), headache or mild pain (pain score 1-3).   Yes [provider]  albuterol  (VENTOLIN  HFA) 108 (90 Base) MCG/ACT inhaler Inhale 2 puffs into the lungs every 6 (six) hours as needed for wheezing or shortness of breath. 05/09/20  Yes [provider]  amiodarone  (PACERONE ) 200 MG tablet Take 2 tablets (400 mg total) by mouth 2 (two) times daily. 04/04/23  Yes Ollis, Brandi L,  NP  amoxicillin  (AMOXIL ) 500 MG capsule Take 4 capsules (2,000mg ) by mouth 30-60 minutes prior to dental work/cleanings. 10/02/22  Yes Camnitz, Will Gladis, MD  atorvastatin  (LIPITOR) 40 MG tablet Take 1 tablet (40 mg total) by mouth daily. Patient taking differently: Take 40 mg by mouth every evening. 10/08/22  Yes Turner, Wilbert SAUNDERS, MD  carvedilol  (COREG ) 25 MG tablet Take 25 mg by mouth in the morning and at bedtime.   Yes [provider]  ELIQUIS  2.5 MG TABS tablet TAKE 1 TABLET TWICE DAILY 07/02/22  Yes Turner, Wilbert SAUNDERS, MD  famotidine  (PEPCID ) 40 MG tablet Take 0.5 tablets (20 mg total) by mouth every evening. 04/02/23  Yes Camnitz, Will Gladis, MD  furosemide  (LASIX ) 40 MG tablet TAKE 1 TABLET EVERY DAY AS NEEDED FOR EDEMA Patient taking differently: Take 20-40 mg by mouth See admin instructions. Take 20 mg by mouth once daily on Tuesday, Wednesday, Thursday, Friday, Saturday, and Sunday. Take 40 mg by mouth once daily on Mondays 07/02/22  Yes Turner, Wilbert SAUNDERS, MD  isosorbide  mononitrate (IMDUR ) 30 MG 24 hr tablet Take 30 mg by mouth daily.   Yes [provider]  mexiletine (MEXITIL ) 150 MG capsule TAKE 2 CAPSULES BY MOUTH EVERY 12 HOURS. 04/12/23  Yes Tillery, Ozell Barter, PA-C  Multiple Vitamin (MULTIVITAMIN WITH MINERALS) TABS tablet Take 1 tablet by mouth in the morning.   Yes [provider]  pantoprazole  (PROTONIX ) 40 MG tablet Take 40 mg by mouth daily before breakfast.   Yes [provider]  Polyethyl Glycol-Propyl Glycol (SYSTANE OP) Place 1 drop into both eyes as needed (dry/irritated eyes.).   Yes [provider]  scopolamine  (TRANSDERM-SCOP) 1 MG/3DAYS Place 1 patch (1.5 mg total) onto the skin every 3 (three) days. 04/11/23  Yes Ollis, Brandi L, NP  sodium bicarbonate  650 MG tablet Take 650 mg by mouth in the morning.   Yes [provider]  VASCEPA  1 g capsule TAKE 2 CAPSULES BY MOUTH 2 TIMES DAILY. 02/15/23  Yes Shlomo Wilbert SAUNDERS, MD     Inpatient Medications: Scheduled Meds:  Continuous Infusions:  PRN Meds:   Allergies:    Allergies  Allergen Reactions   Ranolazine  Other (See Comments)    States he went into a coma and was in hospital for 3 days after taking   Procaine Swelling and Other (See Comments)   Amiodarone  Nausea And Vomiting and Other (See Comments)    Social History:   Social History   Socioeconomic History   Marital status: Married    Spouse name: Not on file   Number of children: Not on file   Years of education: Not on file   Highest education level: Not on file  Occupational History  Not on file  Tobacco Use   Smoking status: Never   Smokeless tobacco: Never   Tobacco comments:    Never smoke 04/17/22  Vaping Use   Vaping status: Never Used  Substance and Sexual Activity   Alcohol use: Not Currently    Comment: 04/22/2015 nothing since the 1980s; never had a problem w/it   Drug use: No   Sexual activity: Not Currently  Other Topics Concern   Not on file  Social History Narrative   Married, lives with spouse Claude   4 children, 2 boys and 2 girls > one daughter passed   OCCUPATION: retired, nurse, learning disability for [employer]   Social Drivers of Sungard Resource Strain: Not on file  Food Insecurity: No Food Insecurity (03/19/2023)   Hunger Vital Sign    Worried About Running Out of Food in the Last Year: Never true    Ran Out of Food in the Last Year: Never true  Transportation Needs: No Transportation Needs (03/19/2023)   PRAPARE - Administrator, Civil Service (Medical): No    Lack of Transportation (Non-Medical): No  Physical Activity: Not on file  Stress: Not on file  Social Connections: Not on file  Intimate Partner Violence: Not At Risk (03/19/2023)   Humiliation, Afraid, Rape, and Kick questionnaire    Fear of Current or Ex-Partner: No    Emotionally Abused: No    Physically Abused: No    Sexually Abused: No    Family  History:    Family History  Problem Relation Age of Onset   Alzheimer's disease Mother    COPD Daughter    Alzheimer's disease Other    COPD Sister    Depression Sister    Heart disease Sister    Hyperlipidemia Sister    Hypertension Sister    Esophageal cancer Sister        + smoker      Review of Systems: [y] = yes, [ ]  = no   General: Weight gain [ ] ; Weight loss [ ] ; Anorexia [ ] ; Fatigue [ ] ; Fever [ ] ; Chills [ ] ; Weakness [ ]   Cardiac: Chest pain/pressure [ ] ; Resting SOB [ ] ; Exertional SOB [ ] ; Orthopnea [ ] ; Pedal Edema [ ] ; Palpitations [ ] ; Syncope [ ] ; Presyncope [ ] ; Paroxysmal nocturnal dyspnea[ ]   Pulmonary: Cough [ ] ; Wheezing[ ] ; Hemoptysis[ ] ; Sputum [ ] ; Snoring [ ]   GI: Vomiting[ ] ; Dysphagia[ ] ; Melena[ ] ; Hematochezia [ ] ; Heartburn[ ] ; Abdominal pain [ ] ; Constipation [ ] ; Diarrhea [ ] ; BRBPR [ ]   GU: Hematuria[ ] ; Dysuria [ ] ; Nocturia[ ]   Vascular: Pain in legs with walking [ ] ; Pain in feet with lying flat [ ] ; Non-healing sores [ ] ; Stroke [ ] ; TIA [ ] ; Slurred speech [ ] ;  Neuro: Headaches[ ] ; Vertigo[ ] ; Seizures[ ] ; Paresthesias[ ] ;Blurred vision [ ] ; Diplopia [ ] ; Vision changes [ ]   Ortho/Skin: Arthritis [ ] ; Joint pain [ ] ; Muscle pain [ ] ; Joint swelling [ ] ; Back Pain [ ] ; Rash [ ]   Psych: Depression[ ] ; Anxiety[ ]   Heme: Bleeding problems [ ] ; Clotting disorders [ ] ; Anemia [ ]   Endocrine: Diabetes [ ] ; Thyroid  dysfunction[ ]   Physical Exam/Data:   Vitals:   04/12/23 1700 04/12/23 1715 04/12/23 1800 04/12/23 1926  BP:    (!) 142/85  Pulse: 81 84 82 85  Resp: 19 20 (!) 22 20  Temp:    (!) 97.5 F (  36.4 C)  TempSrc:    Oral  SpO2: 98% 100% 98% 98%  Weight:      Height:       No intake or output data in the 24 hours ending 04/12/23 2048 Filed Weights   04/12/23 1602  Weight: 85.3 kg   Body mass index is 25.5 kg/m.  General:  Well nourished, well developed, in no acute distress HEENT: normal Lymph: no adenopathy Neck: no  JVD Endocrine:  No thryomegaly Vascular: No carotid bruits; FA pulses 2+ bilaterally without bruits  Cardiac:  normal S1, S2; RRR; no murmur  Lungs:  clear to auscultation bilaterally, no wheezing, rhonchi or rales  Abd: soft, nontender, no hepatomegaly  Ext: no edema Musculoskeletal:  No deformities, BUE and BLE strength normal and equal Skin: warm and dry  Neuro:  CNs 2-12 intact, no focal abnormalities noted Psych:  Normal affect   EKG:  The EKG was personally reviewed and demonstrates:   Telemetry:  Telemetry was personally reviewed and demonstrates:  atrial fib  Relevant CV Studies:   Laboratory Data:  Chemistry Recent Labs  Lab 04/12/23 1729  NA 136  K 5.5*  CL 103  CO2 24  GLUCOSE 116*  BUN 26*  CREATININE 2.15*  CALCIUM  8.2*  GFRNONAA 29*  ANIONGAP 9    No results for input(s): PROT, ALBUMIN, AST, ALT, ALKPHOS, BILITOT in the last 168 hours. Hematology Recent Labs  Lab 04/12/23 1639  WBC 9.4  RBC 4.89  HGB 14.7  HCT 45.7  MCV 93.5  MCH 30.1  MCHC 32.2  RDW 15.8*  PLT 292   Cardiac EnzymesNo results for input(s): TROPONINI in the last 168 hours. No results for input(s): TROPIPOC in the last 168 hours.  BNPNo results for input(s): BNP, PROBNP in the last 168 hours.  DDimer No results for input(s): DDIMER in the last 168 hours.  Radiology/Studies:  DG Chest Port 1 View Result Date: 04/12/2023 CLINICAL DATA:  Shortness of breath EXAM: PORTABLE CHEST 1 VIEW COMPARISON:  03/19/2023 FINDINGS: Left chest wall CRT D. Sternotomy and CABG. Stable cardiomediastinal silhouette. Aortic atherosclerotic calcification. No focal consolidation, pleural effusion, or pneumothorax. Scarring in the right mid lung. No displaced rib fractures. IMPRESSION: No active disease. Electronically Signed   By: Norman Gatlin M.D.   On: 04/12/2023 19:33    Assessment and Plan:    Ventricular tachycardia Storm Recently start on amiodarone  s/p CRT-D Nausea &  vomiting HFrEF Failure to thrive   Plan: -Admit to medicine. -Restart amiodarone  drip. Consider adding mexiletone. - EP re-evaluation. -Goals of care discussion.   For questions or updates, please contact [facility] Please consult [email or web address] for contact info under   CRITICAL CARE Performed by: Nivaan Dicenzo A Damaree Sargent   Total critical care time: 40 minutes  Critical care time was exclusive of separately billable procedures and treating other patients.  Critical care was necessary to treat or prevent imminent or life-threatening deterioration.  Critical care was time spent personally by me on the following activities: development of treatment plan with patient and/or surrogate as well as nursing, discussions with consultants, evaluation of patient's response to treatment, examination of patient, obtaining history from patient or surrogate, ordering and performing treatments and interventions, ordering and review of laboratory studies, ordering and review of radiographic studies, pulse oximetry and re-evaluation of patient's condition.    Signed, Tylek Boney A Isaiah Torok, MD  04/12/2023 8:48 PM

## 2023-04-12 NOTE — ED Notes (Signed)
 Lab stated pt light green top hemolyzed. RN will recollect

## 2023-04-12 NOTE — ED Triage Notes (Signed)
 Pt bib ems from home c/o increased weakness and tremors. Pt was recently d/c 12/25 for tremors and weakness. Pt had to have corrections to his pacemaker as well. Yesterday pt had the same symptoms from December. No CP and SOB. Demand pacer.  BP 148/86 HR 92 RA 98% CBG 228

## 2023-04-12 NOTE — ED Provider Notes (Signed)
 [facility] EMERGENCY DEPARTMENT AT [facility] Provider Note   CSN: [id number] Arrival date & time: 04/12/23  1554     History  Chief Complaint  Patient presents with   Weakness    TAARIQ LEITZ is a 89 y.o. male.  89 year old male with a history of heart failure with reduced ejection fraction, CAD, aortic stenosis status post bioprosthetic valve replacement, atrial fibrillation, CKD, hypothyroidism, and VT storm status post ICD who presents to the emergency department with generalized weakness, nausea, and tremors.  Patient was recently hospitalized and discharged for generalized weakness.  Was found to be having episodes of ventricular tachycardia.  Was started on amiodarone  IV and then transition to oral.  Since starting the oral amiodarone  has been having nausea.  Yesterday had an episode of vomiting.  Says he is too weak to walk today.  Typically walks with a walker and lives at home.       Home Medications Prior to Admission medications   Medication Sig Start Date End Date Taking? Authorizing Provider  acetaminophen  (TYLENOL ) 500 MG tablet Take 500 mg by mouth as needed for moderate pain (pain score 4-6), headache or mild pain (pain score 1-3).   Yes [provider]  albuterol  (VENTOLIN  HFA) 108 (90 Base) MCG/ACT inhaler Inhale 2 puffs into the lungs every 6 (six) hours as needed for wheezing or shortness of breath. 05/09/20  Yes [provider]  amiodarone  (PACERONE ) 200 MG tablet Take 2 tablets (400 mg total) by mouth 2 (two) times daily. 04/04/23  Yes Ollis, Brandi L, NP  amoxicillin  (AMOXIL ) 500 MG capsule Take 4 capsules (2,000mg ) by mouth 30-60 minutes prior to dental work/cleanings. 10/02/22  Yes Camnitz, Will Gladis, MD  atorvastatin  (LIPITOR) 40 MG tablet Take 1 tablet (40 mg total) by mouth daily. Patient taking differently: Take 40 mg by mouth every evening. 10/08/22  Yes Turner, Wilbert SAUNDERS, MD  carvedilol  (COREG ) 25 MG tablet Take 25 mg by mouth  in the morning and at bedtime.   Yes [provider]  ELIQUIS  2.5 MG TABS tablet TAKE 1 TABLET TWICE DAILY 07/02/22  Yes Turner, Wilbert SAUNDERS, MD  famotidine  (PEPCID ) 40 MG tablet Take 0.5 tablets (20 mg total) by mouth every evening. 04/02/23  Yes Camnitz, Will Gladis, MD  furosemide  (LASIX ) 40 MG tablet TAKE 1 TABLET EVERY DAY AS NEEDED FOR EDEMA Patient taking differently: Take 20-40 mg by mouth See admin instructions. Take 20 mg by mouth once daily on Tuesday, Wednesday, Thursday, Friday, Saturday, and Sunday. Take 40 mg by mouth once daily on Mondays 07/02/22  Yes Turner, Wilbert SAUNDERS, MD  isosorbide  mononitrate (IMDUR ) 30 MG 24 hr tablet Take 30 mg by mouth daily.   Yes [provider]  mexiletine (MEXITIL ) 150 MG capsule TAKE 2 CAPSULES BY MOUTH EVERY 12 HOURS. 04/12/23  Yes Tillery, Ozell Barter, PA-C  Multiple Vitamin (MULTIVITAMIN WITH MINERALS) TABS tablet Take 1 tablet by mouth in the morning.   Yes [provider]  pantoprazole  (PROTONIX ) 40 MG tablet Take 40 mg by mouth daily before breakfast.   Yes [provider]  Polyethyl Glycol-Propyl Glycol (SYSTANE OP) Place 1 drop into both eyes as needed (dry/irritated eyes.).   Yes [provider]  scopolamine  (TRANSDERM-SCOP) 1 MG/3DAYS Place 1 patch (1.5 mg total) onto the skin every 3 (three) days. 04/11/23  Yes Ollis, Brandi L, NP  sodium bicarbonate  650 MG tablet Take 650 mg by mouth in the morning.   Yes [provider]  VASCEPA  1 g capsule TAKE 2 CAPSULES BY MOUTH 2 TIMES DAILY. 02/15/23  Yes Turner, Wilbert SAUNDERS, MD      Allergies    Ranolazine , Procaine, and Amiodarone     Review of Systems   Review of Systems  Physical Exam Updated Vital Signs BP (!) 145/84 (BP Location: Right Arm)   Pulse 90   Temp 98 F (36.7 C) (Oral)   Resp 19   Ht 6' (1.829 m)   Wt 88.3 kg   SpO2 96%   BMI 26.40 kg/m  Physical Exam Vitals and nursing note reviewed.  Constitutional:      General: He is not in  acute distress.    Appearance: He is well-developed.  HENT:     Head: Normocephalic and atraumatic.     Right Ear: External ear normal.     Left Ear: External ear normal.     Nose: Nose normal.  Eyes:     Extraocular Movements: Extraocular movements intact.     Conjunctiva/sclera: Conjunctivae normal.     Pupils: Pupils are equal, round, and reactive to light.  Cardiovascular:     Rate and Rhythm: Normal rate and regular rhythm.     Heart sounds: Normal heart sounds.  Pulmonary:     Effort: Pulmonary effort is normal. No respiratory distress.     Breath sounds: Normal breath sounds.  Abdominal:     General: There is no distension.     Palpations: Abdomen is soft. There is no mass.     Tenderness: There is no abdominal tenderness. There is no guarding.  Musculoskeletal:     Cervical back: Normal range of motion and neck supple.     Right lower leg: No edema.     Left lower leg: No edema.  Skin:    General: Skin is warm and dry.  Neurological:     Mental Status: He is alert.     Cranial Nerves: No cranial nerve deficit.     Sensory: No sensory deficit.     Motor: No weakness.     Comments: MENTAL STATUS: AAOx3 CRANIAL NERVES: II: Pupils equal and reactive 4 mm BL, no RAPD, no VF deficits III, IV, VI: EOM intact, no gaze preference or deviation, no nystagmus. V: normal sensation to light touch in V1, V2, and V3 segments bilaterally VII: no facial weakness or asymmetry, no nasolabial fold flattening VIII: normal hearing to speech and finger friction IX, X: normal palatal elevation, no uvular deviation XI: 5/5 head turn and 5/5 shoulder shrug bilaterally XII: midline tongue protrusion MOTOR: 5/5 strength in R shoulder flexion, elbow flexion and extension, and grip strength. 5/5 strength in L shoulder flexion, elbow flexion and extension, and grip strength.  5/5 strength in R hip and knee flexion, knee extension, ankle plantar and dorsiflexion. 5/5 strength in L hip and knee  flexion, knee extension, ankle plantar and dorsiflexion. SENSORY: Normal sensation to light touch in all extremities COORD: Intention tremor on finger-nose   Psychiatric:        Mood and Affect: Mood normal.        Behavior: Behavior normal.     ED Results / Procedures / Treatments   Labs (all labs ordered are listed, but only abnormal results are displayed) Labs Reviewed  CBC WITH DIFFERENTIAL/PLATELET - Abnormal; Notable for the following components:      Result Value   RDW 15.8 (*)    Monocytes Absolute 1.5 (*)    All other components within normal limits  BASIC METABOLIC PANEL - Abnormal; Notable for the following components:   Potassium 5.5 (*)    Glucose, Bld 116 (*)    BUN 26 (*)    Creatinine, Ser 2.15 (*)    Calcium  8.2 (*)    GFR, Estimated 29 (*)    All other components within normal limits  BRAIN NATRIURETIC PEPTIDE - Abnormal; Notable for the following components:   B Natriuretic Peptide 597.6 (*)    All other components within normal limits  BASIC METABOLIC PANEL - Abnormal; Notable for the following components:   Glucose, Bld 163 (*)    BUN 24 (*)    Creatinine, Ser 2.24 (*)    Calcium  8.4 (*)    GFR, Estimated 28 (*)    All other components within normal limits  BASIC METABOLIC PANEL - Abnormal; Notable for the following components:   Sodium 133 (*)    Chloride 96 (*)    Glucose, Bld 129 (*)    BUN 25 (*)    Creatinine, Ser 2.27 (*)    Calcium  8.7 (*)    GFR, Estimated 27 (*)    All other components within normal limits  CBC - Abnormal; Notable for the following components:   WBC 13.1 (*)    All other components within normal limits  CBG MONITORING, ED - Abnormal; Notable for the following components:   Glucose-Capillary 123 (*)    All other components within normal limits  TROPONIN I (HIGH SENSITIVITY) - Abnormal; Notable for the following components:   Troponin I (High Sensitivity) 26 (*)    All other components within normal limits  TROPONIN I  (HIGH SENSITIVITY) - Abnormal; Notable for the following components:   Troponin I (High Sensitivity) 31 (*)    All other components within normal limits  MAGNESIUM   CBC  URINALYSIS, ROUTINE W REFLEX MICROSCOPIC  AMIODARONE  LEVEL  BASIC METABOLIC PANEL  CBC    EKG None  Radiology ECHOCARDIOGRAM COMPLETE Result Date: 04/14/2023    ECHOCARDIOGRAM REPORT   Patient Name:   KHYLON DAVIES [facility] Date of Exam: 04/14/2023 Medical Rec #:  [id number]       Height:       72.0 in Accession #:    [id number]      Weight:       194.7 lb Date of Birth:  1932-04-21       BSA:          2.106 m Patient Age:    89 years        BP:           146/87 mmHg Patient Gender: M               HR:           76 bpm. Exam Location:  Inpatient Procedure: 2D Echo, Cardiac Doppler and Color Doppler Indications:    Ventricular tachycardia  History:        Patient has no prior history of Echocardiogram examinations and                 Patient has prior history of Echocardiogram examinations, most                 recent 09/07/2019. CHF, CAD, Pacemaker and Prior CABG,                 Arrythmias:Atrial Fibrillation; Risk Factors:Hypertension and                 Dyslipidemia. CKD.  Sonographer:    Juanita Shaw Referring Phys: [phone] STEVEN C KLEIN IMPRESSIONS  1. Left ventricular ejection fraction, by estimation, is 20 to 25%. The left ventricle has severely decreased function. The left ventricle demonstrates global hypokinesis. Left ventricular diastolic parameters are indeterminate.  2. Right ventricular systolic function is normal. The right ventricular size is normal.  3. Left atrial size was severely dilated.  4. The mitral valve is normal in structure. No evidence of mitral valve regurgitation. No evidence of mitral stenosis. Moderate mitral annular calcification.  5. 23 magna ease pericardial valve is in the aortic valve position. . The aortic valve has been repaired/replaced. Aortic valve regurgitation is not visualized. No aortic stenosis is  present. Aortic valve mean gradient measures 4.0 mmHg.  6. The inferior vena cava is normal in size with greater than 50% respiratory variability, suggesting right atrial pressure of 3 mmHg. FINDINGS  Left Ventricle: Left ventricular ejection fraction, by estimation, is 20 to 25%. The left ventricle has severely decreased function. The left ventricle demonstrates global hypokinesis. The left ventricular internal cavity size was normal in size. There is no left ventricular hypertrophy. Left ventricular diastolic parameters are indeterminate. Right Ventricle: The right ventricular size is normal. Right vetricular wall thickness was not well visualized. Right ventricular systolic function is normal. Left Atrium: Left atrial size was severely dilated. Right Atrium: Right atrial size was not well visualized. Pericardium: There is no evidence of pericardial effusion. Mitral Valve: The mitral valve is normal in structure. There is mild thickening of the mitral valve leaflet(s). There is mild calcification of the mitral valve leaflet(s). Moderate mitral annular calcification. No evidence of mitral valve regurgitation. No evidence of mitral valve stenosis. MV peak gradient, 6.9 mmHg. The mean mitral valve gradient is 2.0 mmHg. Tricuspid Valve: The tricuspid valve is normal in structure. Tricuspid valve regurgitation is not demonstrated. No evidence of tricuspid stenosis. Aortic Valve: 23 magna ease pericardial valve is in the aortic valve position. The aortic valve has been repaired/replaced. Aortic valve regurgitation is not visualized. No aortic stenosis is present. Aortic valve mean gradient measures 4.0 mmHg. Aortic valve peak gradient measures 6.2 mmHg. Aortic valve area, by VTI measures 1.82 cm. Pulmonic Valve: The pulmonic valve was not well visualized. Pulmonic valve regurgitation is not visualized. No evidence of pulmonic stenosis. Aorta: The aortic root and ascending aorta are structurally normal, with no evidence  of dilitation. Venous: The inferior vena cava is normal in size with greater than 50% respiratory variability, suggesting right atrial pressure of 3 mmHg. IAS/Shunts: The interatrial septum was not well visualized. Additional Comments: A device lead is visualized in the right atrium and right ventricle.  LEFT VENTRICLE PLAX 2D LVIDd:         5.70 cm      Diastology LVIDs:         4.70 cm      LV e' medial:   6.42 cm/s LV PW:         0.90 cm      LV E/e' medial: 21.5 LV IVS:        0.70 cm LVOT diam:     2.20 cm LV SV:         37 LV SV Index:   17 LVOT Area:     3.80 cm  LV Volumes (MOD) LV vol d, MOD A2C: 171.0 ml LV vol d, MOD A4C: 184.0 ml LV vol s, MOD A2C: 101.0 ml LV vol s, MOD A4C: 119.0 ml LV SV  MOD A2C:     70.0 ml LV SV MOD A4C:     184.0 ml LV SV MOD BP:      67.9 ml RIGHT VENTRICLE            IVC RV Basal diam:  3.70 cm    IVC diam: 1.40 cm RV Mid diam:    2.50 cm RV S prime:     6.57 cm/s TAPSE (M-mode): 1.3 cm LEFT ATRIUM              Index        RIGHT ATRIUM           Index LA diam:        4.60 cm  2.18 cm/m   RA Area:     21.20 cm LA Vol (A2C):   135.0 ml 64.10 ml/m  RA Volume:   63.50 ml  30.15 ml/m LA Vol (A4C):   110.0 ml 52.23 ml/m LA Biplane Vol: 122.0 ml 57.92 ml/m  AORTIC VALVE                    PULMONIC VALVE AV Area (Vmax):    1.74 cm     PV Vmax:       0.49 m/s AV Area (Vmean):   1.63 cm     PV Peak grad:  1.0 mmHg AV Area (VTI):     1.82 cm AV Vmax:           124.00 cm/s AV Vmean:          87.500 cm/s AV VTI:            0.202 m AV Peak Grad:      6.2 mmHg AV Mean Grad:      4.0 mmHg LVOT Vmax:         56.60 cm/s LVOT Vmean:        37.600 cm/s LVOT VTI:          0.097 m LVOT/AV VTI ratio: 0.48  AORTA Ao Root diam: 3.30 cm Ao Asc diam:  2.50 cm MITRAL VALVE MV Area (PHT): 6.12 cm     SHUNTS MV Area VTI:   1.82 cm     Systemic VTI:  0.10 m MV Peak grad:  6.9 mmHg     Systemic Diam: 2.20 cm MV Mean grad:  2.0 mmHg MV Vmax:       1.31 m/s MV Vmean:      60.7 cm/s MV Decel Time: 124  msec MV E velocity: 138.00 cm/s Dorn Ross MD Electronically signed by Dorn Ross MD Signature Date/Time: 04/14/2023/1:22:10 PM    Final     Procedures Procedures    Medications Ordered in ED Medications  amiodarone  (NEXTERONE  PREMIX) 360-4.14 MG/200ML-% (1.8 mg/mL) IV infusion (0 mg/hr Intravenous Stopped 04/13/23 0517)  amiodarone  (NEXTERONE  PREMIX) 360-4.14 MG/200ML-% (1.8 mg/mL) IV infusion (30 mg/hr Intravenous New Bag/Given 04/14/23 0400)  atorvastatin  (LIPITOR) tablet 40 mg (40 mg Oral Given 04/14/23 1707)  furosemide  (LASIX ) tablet 20 mg (20 mg Oral Not Given 04/14/23 1055)  isosorbide  mononitrate (IMDUR ) 24 hr tablet 30 mg (30 mg Oral Given 04/14/23 0816)  famotidine  (PEPCID ) tablet 20 mg (20 mg Oral Given 04/14/23 1707)  pantoprazole  (PROTONIX ) EC tablet 40 mg (40 mg Oral Given 04/14/23 0816)  apixaban  (ELIQUIS ) tablet 2.5 mg (2.5 mg Oral Given 04/14/23 0816)  albuterol  (PROVENTIL ) (2.5 MG/3ML) 0.083% nebulizer solution 2.5 mg (has no administration in time range)  sodium chloride  flush (NS) 0.9 %  injection 3 mL (3 mLs Intravenous Given 04/14/23 0819)  acetaminophen  (TYLENOL ) tablet 650 mg (has no administration in time range)    Or  acetaminophen  (TYLENOL ) suppository 650 mg (has no administration in time range)  senna-docusate (Senokot-S) tablet 1 tablet (has no administration in time range)  ondansetron  (ZOFRAN ) injection 4 mg (has no administration in time range)  promethazine  (PHENERGAN ) 12.5 mg in sodium chloride  0.9 % 50 mL IVPB (has no administration in time range)  0.9 %  sodium chloride  infusion ( Intravenous New Bag/Given 04/14/23 1023)  sodium zirconium cyclosilicate  (LOKELMA ) packet 10 g (10 g Oral Given 04/12/23 1927)  furosemide  (LASIX ) injection 40 mg (40 mg Intravenous Given 04/12/23 1927)  hyaluronidase  Human (HYLENEX ) injection 150 Units (150 Units Subcutaneous Given 04/14/23 1102)    ED Course/ Medical Decision Making/ A&P Clinical Course as of 04/14/23 1843  [place] Apr 12, 2023  1839 [employer] reports shows that the patient has had 125 treated episodes of VT since April 03, 2023. [RP]  1928 Dr Mona from cardiology will evaluate for admission` [RP]  2240 Dr Audelia recommends amiodarone . Admit and would recommend goals of care.  [RP]  2306 Dr Charlton from hospitalist to admit the patient [RP]    Clinical Course User Index [RP] Yolande Lamar BROCKS, MD                                 Medical Decision Making Amount and/or Complexity of Data Reviewed Labs: ordered. Radiology: ordered.  Risk Prescription drug management. Decision regarding hospitalization.   ALEN MATHESON is a 89 y.o. male with comorbidities that complicate the patient evaluation including heart failure with reduced ejection fraction, CAD, aortic stenosis status post bioprosthetic valve replacement, atrial fibrillation, CKD, hypothyroidism, and VT storm status post ICD who presents to the emergency department with generalized weakness, nausea, and tremors.   Initial Ddx:  Tremor, stroke, electrolyte abnormality, arrhythmia  MDM/Course:  Patient presents to the emergency department with tremor and weakness.  Did have a similar presentation was found to be going in and out of V. tach.  On telemetry and EKG appears to be in atrial fibrillation with a controlled rate with bundle branch block.  Occasionally will have paced beats but no signs of V. tach currently.  His pacemaker was interrogated which did show overall 100 episodes of treated ventricular tachycardia.  Discussed with cardiology who recommends admission to hospitalist given his other medical problems.  Was found to have mildly elevated potassium and was given Lokelma  and Lasix .  This is in the setting of decreasing his amiodarone  which was suspected to be causing his nausea and vomiting.  On exam does have a mild intention tremor but no focal neurologic deficits that would be concerning for stroke.  Upon re-evaluation was stable.  This  patient presents to the ED for concern of complaints listed in HPI, this involves an extensive number of treatment options, and is a complaint that carries with it a high risk of complications and morbidity. Disposition including potential need for admission considered.   Dispo: Admit to Floor  Records reviewed Outpatient Clinic Notes The following labs were independently interpreted: Chemistry and show CKD and hyperkalemia I independently reviewed the following imaging with scope of interpretation limited to determining acute life threatening conditions related to emergency care: Chest x-ray and agree with the radiologist interpretation with the following exceptions: none I personally reviewed and interpreted cardiac monitoring: normal  sinus rhythm  I personally reviewed and interpreted the pt's EKG: see above for interpretation  I have reviewed the patients home medications and made adjustments as needed Consults: Cardiology Social Determinants of health:  Elderly  Portions of this note were generated with Scientist, clinical (histocompatibility and immunogenetics). Dictation errors may occur despite best attempts at proofreading.    CRITICAL CARE Performed by: Lamar JAYSON Shan   Total critical care time: 30 minutes  Critical care time was exclusive of separately billable procedures and treating other patients.  Critical care was necessary to treat or prevent imminent or life-threatening deterioration.  Critical care was time spent personally by me on the following activities: development of treatment plan with patient and/or surrogate as well as nursing, discussions with consultants, evaluation of patient's response to treatment, examination of patient, obtaining history from patient or surrogate, ordering and performing treatments and interventions, ordering and review of laboratory studies, ordering and review of radiographic studies, pulse oximetry and re-evaluation of patient's condition.    Final Clinical  Impression(s) / ED Diagnoses Final diagnoses:  Generalized weakness  Hyperkalemia  Ventricular tachycardia [facility])    Rx / DC Orders ED Discharge Orders     None         Shan Lamar JAYSON, MD 04/14/23 1843

## 2023-04-13 ENCOUNTER — Encounter (HOSPITAL_COMMUNITY): Payer: Self-pay | Admitting: Family Medicine

## 2023-04-13 DIAGNOSIS — I472 Ventricular tachycardia, unspecified: Secondary | ICD-10-CM | POA: Diagnosis not present

## 2023-04-13 LAB — CBC
HCT: 41.2 % (ref 39.0–52.0)
Hemoglobin: 13.1 g/dL (ref 13.0–17.0)
MCH: 30 pg (ref 26.0–34.0)
MCHC: 31.8 g/dL (ref 30.0–36.0)
MCV: 94.5 fL (ref 80.0–100.0)
Platelets: 282 10*3/uL (ref 150–400)
RBC: 4.36 MIL/uL (ref 4.22–5.81)
RDW: 15.3 % (ref 11.5–15.5)
WBC: 10.5 10*3/uL (ref 4.0–10.5)
nRBC: 0 % (ref 0.0–0.2)

## 2023-04-13 LAB — BASIC METABOLIC PANEL
Anion gap: 14 (ref 5–15)
BUN: 24 mg/dL — ABNORMAL HIGH (ref 8–23)
CO2: 22 mmol/L (ref 22–32)
Calcium: 8.4 mg/dL — ABNORMAL LOW (ref 8.9–10.3)
Chloride: 100 mmol/L (ref 98–111)
Creatinine, Ser: 2.24 mg/dL — ABNORMAL HIGH (ref 0.61–1.24)
GFR, Estimated: 28 mL/min — ABNORMAL LOW (ref >60)
Glucose, Bld: 163 mg/dL — ABNORMAL HIGH (ref 70–99)
Potassium: 3.9 mmol/L (ref 3.5–5.1)
Sodium: 136 mmol/L (ref 135–145)

## 2023-04-13 MED ORDER — ISOSORBIDE MONONITRATE ER 30 MG PO TB24
30.0000 mg | ORAL_TABLET | Freq: Every day | ORAL | Status: DC
  Administered 2023-04-13 – 2023-04-22 (×10): 30 mg via ORAL
  Filled 2023-04-13 (×10): qty 1

## 2023-04-13 MED ORDER — PANTOPRAZOLE SODIUM 40 MG PO TBEC
40.0000 mg | DELAYED_RELEASE_TABLET | Freq: Every day | ORAL | Status: DC
  Administered 2023-04-13 – 2023-04-22 (×10): 40 mg via ORAL
  Filled 2023-04-13 (×10): qty 1

## 2023-04-13 MED ORDER — ALBUTEROL SULFATE (2.5 MG/3ML) 0.083% IN NEBU
2.5000 mg | INHALATION_SOLUTION | Freq: Four times a day (QID) | RESPIRATORY_TRACT | Status: DC | PRN
  Administered 2023-04-15: 2.5 mg via RESPIRATORY_TRACT
  Filled 2023-04-13: qty 3

## 2023-04-13 MED ORDER — CARVEDILOL 12.5 MG PO TABS: 25.0000 mg | ORAL_TABLET | Freq: Two times a day (BID) | ORAL | Status: DC

## 2023-04-13 MED ORDER — APIXABAN 2.5 MG PO TABS
2.5000 mg | ORAL_TABLET | Freq: Two times a day (BID) | ORAL | Status: DC
  Administered 2023-04-13 – 2023-04-22 (×20): 2.5 mg via ORAL
  Filled 2023-04-13 (×20): qty 1

## 2023-04-13 MED ORDER — SODIUM CHLORIDE 0.9% FLUSH
3.0000 mL | Freq: Two times a day (BID) | INTRAVENOUS | Status: DC
  Administered 2023-04-13 – 2023-04-22 (×15): 3 mL via INTRAVENOUS

## 2023-04-13 MED ORDER — ACETAMINOPHEN 325 MG PO TABS
650.0000 mg | ORAL_TABLET | Freq: Four times a day (QID) | ORAL | Status: DC | PRN
  Administered 2023-04-15: 650 mg via ORAL
  Filled 2023-04-13: qty 2

## 2023-04-13 MED ORDER — ACETAMINOPHEN 650 MG RE SUPP
650.0000 mg | Freq: Four times a day (QID) | RECTAL | Status: DC | PRN
Start: 2023-04-13 — End: 2023-04-22

## 2023-04-13 MED ORDER — SENNOSIDES-DOCUSATE SODIUM 8.6-50 MG PO TABS: 1.0000 | ORAL_TABLET | Freq: Every evening | ORAL | Status: DC | PRN

## 2023-04-13 MED ORDER — SCOPOLAMINE 1 MG/3DAYS TD PT72
1.0000 | MEDICATED_PATCH | TRANSDERMAL | Status: DC
  Administered 2023-04-13: 1.5 mg via TRANSDERMAL
  Filled 2023-04-13: qty 1

## 2023-04-13 MED ORDER — FUROSEMIDE 20 MG PO TABS
20.0000 mg | ORAL_TABLET | Freq: Every day | ORAL | Status: DC
  Administered 2023-04-13: 20 mg via ORAL
  Filled 2023-04-13 (×2): qty 1

## 2023-04-13 MED ORDER — FAMOTIDINE 20 MG PO TABS
20.0000 mg | ORAL_TABLET | Freq: Every evening | ORAL | Status: DC
  Administered 2023-04-13 – 2023-04-21 (×9): 20 mg via ORAL
  Filled 2023-04-13 (×9): qty 1

## 2023-04-13 MED ORDER — ATORVASTATIN CALCIUM 40 MG PO TABS
40.0000 mg | ORAL_TABLET | Freq: Every evening | ORAL | Status: DC
  Administered 2023-04-13 – 2023-04-21 (×9): 40 mg via ORAL
  Filled 2023-04-13 (×9): qty 1

## 2023-04-13 NOTE — Progress Notes (Signed)
 PROGRESS NOTE    [name]  FMW:[id number] DOB: Dec 15, 1932 DOA: 04/12/2023 PCP: Nanci Senior, MD   Brief Narrative:  HPI: [name] is a 89 y.o. male with medical history significant for hypertension, hyperlipidemia, CAD, atrial fibrillation on Eliquis , AAS status post bioprosthetic AVR, HFrEF, and ventricular tachycardia who presents with nausea and fatigue.   Patient was recently admitted with VT storm requiring ATP.  Sotalol  was stopped due to ineffectiveness and he was transition to amiodarone  and had mexiletine increased.  He continued to have ATP despite reloading IV amiodarone , was not felt to be a candidate for invasive EP procedures, and was eventually discharged home per patient's wishes with the understanding that he would continue to have VT.    Since the discharge, he was experiencing significant nausea that was attributed to his medications and EP recommended decreasing his amiodarone .   ED Course: Upon arrival to the ED, patient is found to be afebrile and saturating well on room air with normal heart rate and stable blood pressure.  Labs were most notable for potassium 5.5, creatinine 2.15, normal WBC, and troponin 26.   Cardiology was consulted by the ED physician and patient was given IV Lasix , Lokelma , and was started on amiodarone  infusion.  Assessment & Plan:   Principal Problem:   Ventricular tachycardia (HCC) Active Problems:   Coronary artery disease   Persistent atrial fibrillation (HCC)   Stage 4 chronic kidney disease (HCC)   Hyperkalemia   Chronic combined systolic (congestive) and diastolic (congestive) heart failure (HCC)   Ventricular tachycardia, sustained (HCC)  1. Ventricular tachycardia  - Appreciate cardiology consultation, patient remains on amiodarone  infusion.  Management per cardiology.   2. CAD  -Asymptomatic, continue beta-blocker and nitrate     3. HFrEF  - Appears compensated  - Continue Lasix  but discontinue Coreg  as  that medication was discontinued upon discharge by EP recently.   4. Atrial fibrillation  - Coreg , Eliquis     5. CKD 4; hyperkalemia  -Baseline creatinine around 2.4-2.5, SCr appears close to baseline, mild hyperkalemia treated with Lokelma  in the ED and normalized now.   Hyperlipidemia: Continue atorvastatin .  DVT prophylaxis: apixaban  (ELIQUIS ) tablet 2.5 mg Start: 04/13/23 0200   Code Status: Full Code  Family Communication: dtr present at bedside.  Plan of care discussed with patient in length and he/she verbalized understanding and agreed with it.  Status is: Inpatient Remains inpatient appropriate because: On amiodarone  infusion.   Estimated body mass index is 25.5 kg/m as calculated from the following:   Height as of this encounter: 6' (1.829 m).   Weight as of this encounter: 85.3 kg.    Nutritional Assessment: Body mass index is 25.5 kg/m.SABRA Seen by dietician.  I agree with the assessment and plan as outlined below: Nutrition Status:        . Skin Assessment: I have examined the patient's skin and I agree with the wound assessment as performed by the wound care RN as outlined below:    Consultants:  Cardiology  Procedures:  None  Antimicrobials:  Anti-infectives (From admission, onward)    None         Subjective: Seen and examined.  Complains of mild nausea but was able to tolerate regular diet for the breakfast this morning.  Denies chest pain, shortness of breath palpitations or dizziness.  Objective: Vitals:   04/12/23 2300 04/13/23 0200 04/13/23 0500 04/13/23 0700  BP: 125/85 128/75 129/85 130/86  Pulse: 79 79 86 87  Resp:  17 20 15 20   Temp: 97.9 F (36.6 C) 97.8 F (36.6 C) 97.9 F (36.6 C)   TempSrc: Oral Oral Oral   SpO2: 97% 97% 97% 99%  Weight:      Height:        Intake/Output Summary (Last 24 hours) at 04/13/2023 0754 Last data filed at 04/13/2023 0517 Gross per 24 hour  Intake 205.06 ml  Output 350 ml  Net -144.94 ml    Filed Weights   04/12/23 1602  Weight: 85.3 kg    Examination:  General exam: Appears calm and comfortable  Respiratory system: Clear to auscultation. Respiratory effort normal. Cardiovascular system: S1 & S2 heard, RRR. No JVD, murmurs, rubs, gallops or clicks. No pedal edema. Gastrointestinal system: Abdomen is nondistended, soft and nontender. No organomegaly or masses felt. Normal bowel sounds heard. Central nervous system: Alert and oriented. No focal neurological deficits. Extremities: Symmetric 5 x 5 power. Skin: No rashes, lesions or ulcers Psychiatry: Judgement and insight appear normal. Mood & affect appropriate.    Data Reviewed: I have personally reviewed following labs and imaging studies  CBC: Recent Labs  Lab 04/12/23 1639 04/13/23 0238  WBC 9.4 10.5  NEUTROABS 6.4  --   HGB 14.7 13.1  HCT 45.7 41.2  MCV 93.5 94.5  PLT 292 282   Basic Metabolic Panel: Recent Labs  Lab 04/12/23 1729 04/13/23 0238  NA 136 136  K 5.5* 3.9  CL 103 100  CO2 24 22  GLUCOSE 116* 163*  BUN 26* 24*  CREATININE 2.15* 2.24*  CALCIUM  8.2* 8.4*  MG 2.4  --    GFR: Estimated Creatinine Clearance: 25 mL/min (A) (by C-G formula based on SCr of 2.24 mg/dL (H)). Liver Function Tests: No results for input(s): AST, ALT, ALKPHOS, BILITOT, PROT, ALBUMIN in the last 168 hours. No results for input(s): LIPASE, AMYLASE in the last 168 hours. No results for input(s): AMMONIA in the last 168 hours. Coagulation Profile: No results for input(s): INR, PROTIME in the last 168 hours. Cardiac Enzymes: No results for input(s): CKTOTAL, CKMB, CKMBINDEX, TROPONINI in the last 168 hours. BNP (last 3 results) No results for input(s): PROBNP in the last 8760 hours. HbA1C: No results for input(s): HGBA1C in the last 72 hours. CBG: Recent Labs  Lab 04/12/23 1659  GLUCAP 123*   Lipid Profile: No results for input(s): CHOL, HDL, LDLCALC, TRIG,  CHOLHDL, LDLDIRECT in the last 72 hours. Thyroid  Function Tests: No results for input(s): TSH, T4TOTAL, FREET4, T3FREE, THYROIDAB in the last 72 hours. Anemia Panel: No results for input(s): VITAMINB12, FOLATE, FERRITIN, TIBC, IRON, RETICCTPCT in the last 72 hours. Sepsis Labs: No results for input(s): PROCALCITON, LATICACIDVEN in the last 168 hours.  No results found for this or any previous visit (from the past 240 hours).   Radiology Studies: DG Chest Port 1 View Result Date: 04/12/2023 CLINICAL DATA:  Shortness of breath EXAM: PORTABLE CHEST 1 VIEW COMPARISON:  03/19/2023 FINDINGS: Left chest wall CRT D. Sternotomy and CABG. Stable cardiomediastinal silhouette. Aortic atherosclerotic calcification. No focal consolidation, pleural effusion, or pneumothorax. Scarring in the right mid lung. No displaced rib fractures. IMPRESSION: No active disease. Electronically Signed   By: Norman Gatlin M.D.   On: 04/12/2023 19:33    Scheduled Meds:  apixaban   2.5 mg Oral BID   atorvastatin   40 mg Oral QPM   carvedilol   25 mg Oral BID WC   famotidine   20 mg Oral QPM   furosemide   20 mg Oral Daily  isosorbide  mononitrate  30 mg Oral Daily   pantoprazole   40 mg Oral QAC breakfast   scopolamine   1 patch Transdermal Q72H   sodium chloride  flush  3 mL Intravenous Q12H   Continuous Infusions:  amiodarone  30 mg/hr (04/13/23 0516)     LOS: 1 day   Fredia Skeeter, MD [facility]  04/13/2023, 7:54 AM   *Please note that this is a verbal dictation therefore any spelling or grammatical errors are due to the Dragon Medical One system interpretation.  Please page via Amion and do not message via secure chat for urgent patient care matters. Secure chat can be used for non urgent patient care matters.  How to contact the [facility] Attending or Consulting provider 7A - 7P or covering provider during after hours 7P -7A, for this patient?  Check the care team in [facility] and look  for a) attending/consulting [facility] provider listed and b) the [facility] team listed. Page or secure chat 7A-7P. Log into [email or web address] and use [facility]'s universal password to access. If you do not have the password, please contact the hospital operator. Locate the [facility] provider you are looking for under [facility] and page to a number that you can be directly reached. If you still have difficulty reaching the provider, please page the [facility] (Director on Call) for the Hospitalists listed on amion for assistance.

## 2023-04-13 NOTE — H&P (Signed)
 History and Physical    [name] FMW:[id number] DOB: May 27, 1932 DOA: 04/12/2023  PCP: Nanci Senior, MD   Patient coming from: Home   Chief Complaint: Nausea, fatigue   HPI: [name] is a 89 y.o. male with medical history significant for hypertension, hyperlipidemia, CAD, atrial fibrillation on Eliquis , AAS status post bioprosthetic AVR, HFrEF, and ventricular tachycardia who presents with nausea and fatigue.  Patient was recently admitted with VT storm requiring ATP.  Sotalol  was stopped due to ineffectiveness and he was transition to amiodarone  and had mexiletine increased.  He continued to have ATP despite reloading IV amiodarone , was not felt to be a candidate for invasive EP procedures, and was eventually discharged home per patient's wishes with the understanding that he would continue to have VT.   Since the discharge, he was experiencing significant nausea that was attributed to his medications and EP recommended decreasing his amiodarone .  ED Course: Upon arrival to the ED, patient is found to be afebrile and saturating well on room air with normal heart rate and stable blood pressure.  Labs were most notable for potassium 5.5, creatinine 2.15, normal WBC, and troponin 26.  Cardiology was consulted by the ED physician and patient was given IV Lasix , Lokelma , and was started on amiodarone  infusion.  Review of Systems:  All other systems reviewed and apart from HPI, are negative.  Past Medical History:  Diagnosis Date   Arthritis    minor everywhere (04/22/2015)   Asthma    a touch   Barrett esophagus    CAD (coronary artery disease)    a. s/p 3 vvessel CABG with LIMA to LAD, SVG to diagonal 1, SVG to RCA 12/09.   Cardiac resynchronization therapy defibrillator (CRT-D) in place    Chronic anticoagulation 01/23/2013   Chronic combined systolic and diastolic CHF (congestive heart failure) (HCC)    dry weight 197-200lbs.   CKD (chronic kidney disease) stage  3, GFR 30-59 ml/min (HCC) 08/23/2014   Complication of anesthesia    DCM (dilated cardiomyopathy) (HCC)    initially concerned for TTP amyloidosis but Tc-PYP scan 06/2017 was not consistent with amyloid and SPEP/UPEP with no M spike. EF 20-25% by echo 2019   Diverticulitis 08/2019   Dyslipidemia 01/23/2013   Essential hypertension 01/23/2013   GERD (gastroesophageal reflux disease)    GI bleed 05/08/2017   H/O: rheumatic fever    Hepatitis 1957   don't know what kind:   History of hiatal hernia    History of PFTs    a. Amiodarone  started 5/16 >> PFTs w/ DLCO 6/16:  FEV1 72% predicted, FEV1/FVC 66%, DLCO 66% >> minimal reversible obstructive airways disease with mild diffusion defect (suggestive of emphysema but absence of hyperinflation inconsistent with dx)   Hyperkalemia 08/23/2014   Hyperthyroidism 05/26/2017   Leg weakness 01/28/2017   Multiple thyroid  nodules 08/13/2017   Orthostatic hypotension    Persistent atrial fibrillation (HCC) 01/23/2013   Pleural effusion on right 05/26/2017   Pneumonia 08/2014   dr thought I may have had a touch   Pre-diabetes    Protein-calorie malnutrition, severe 05/27/2017   S/P AVR (aortic valve replacement) 2009   a. severe AS s/p AVR with pericardial tissue valve 2009.   Shingles 08/23/2014   VT (ventricular tachycardia) (HCC) 05/06/2017    Past Surgical History:  Procedure Laterality Date   AORTIC VALVE REPLACEMENT  with 23-mm Magna Ease pericardial valve, model number   with 23-mm Magna Ease pericardial valve, model [id number], serial number  [id number]    APPENDECTOMY  1940s   BI-VENTRICULAR IMPLANTABLE CARDIOVERTER DEFIBRILLATOR  (CRT-D)  04/22/2015   BIV ICD GENERATOR CHANGEOUT N/A 09/18/2022   Procedure: BIV ICD GENERATOR CHANGEOUT;  Surgeon: Inocencio Soyla Lunger, MD;  Location: [facility] INVASIVE CV LAB;  Service: Cardiovascular;  Laterality: N/A;   CARDIAC CATHETERIZATION N/A 11/04/2014   Procedure: Left Heart Cath and Cors/Grafts Angiography;   Surgeon: Alm LELON Clay, MD;  Location: [facility] INVASIVE CV LAB;  Service: Cardiovascular;  Laterality: N/A;   CARDIAC VALVE REPLACEMENT     CARDIOVERSION N/A 01/27/2013   Procedure: CARDIOVERSION;  Surgeon: Wilbert JONELLE Bihari, MD;  Location: [facility] ENDOSCOPY;  Service: Cardiovascular;  Laterality: N/A;   CARDIOVERSION N/A 08/25/2014   Procedure: CARDIOVERSION;  Surgeon: Jerel Balding, MD;  Location: [facility] ENDOSCOPY;  Service: Cardiovascular;  Laterality: N/A;   CARDIOVERSION N/A 05/28/2018   Procedure: CARDIOVERSION;  Surgeon: Mona Vinie BROCKS, MD;  Location: [facility] ENDOSCOPY;  Service: Cardiovascular;  Laterality: N/A;   CARDIOVERSION N/A 10/28/2019   Procedure: CARDIOVERSION;  Surgeon: Bihari Wilbert JONELLE, MD;  Location: [facility] ENDOSCOPY;  Service: Cardiovascular;  Laterality: N/A;   CARDIOVERSION N/A 09/01/2020   Procedure: CARDIOVERSION;  Surgeon: Pietro Redell RAMAN, MD;  Location: [facility] ENDOSCOPY;  Service: Cardiovascular;  Laterality: N/A;   CARDIOVERSION N/A 04/04/2022   Procedure: CARDIOVERSION;  Surgeon: Mona Vinie BROCKS, MD;  Location: [facility] ENDOSCOPY;  Service: Cardiovascular;  Laterality: N/A;   CATARACT EXTRACTION W/ INTRAOCULAR LENS  IMPLANT, BILATERAL Bilateral    CHEST TUBE INSERTION Right 07/26/2017   Procedure: INSERTION PLEURAL DRAINAGE CATHETER - RIGHT;  Surgeon: Fleeta Hanford Coy, MD;  Location: [facility] OR;  Service: Thoracic;  Laterality: Right;   CORONARY ARTERY BYPASS GRAFT  03/10/2008   x 3 Dr. Kerrin   EP IMPLANTABLE DEVICE N/A 04/22/2015   Procedure: BiV ICD Insertion CRT-D;  Surgeon: Will Lunger Inocencio, MD;  Location: [facility] INVASIVE CV LAB;  Service: Cardiovascular;  Laterality: N/A;   ESOPHAGOGASTRODUODENOSCOPY (EGD) WITH ESOPHAGEAL DILATION  X1   ESOPHAGOGASTRODUODENOSCOPY (EGD) WITH PROPOFOL  N/A 05/09/2017   Procedure: ESOPHAGOGASTRODUODENOSCOPY (EGD) WITH PROPOFOL ;  Surgeon: Rollin Dover, MD;  Location[facility] ENDOSCOPY;  Service: Endoscopy;  Laterality: N/A;   IR THORACENTESIS ASP PLEURAL SPACE W/IMG  GUIDE  05/27/2017   IR THORACENTESIS ASP PLEURAL SPACE W/IMG GUIDE  06/17/2017   IR THORACENTESIS ASP PLEURAL SPACE W/IMG GUIDE  06/18/2017   IR THORACENTESIS ASP PLEURAL SPACE W/IMG GUIDE  07/11/2017   REMOVAL OF PLEURAL DRAINAGE CATHETER Right 09/13/2017   Procedure: REMOVAL OF PLEURAL DRAINAGE CATHETER;  Surgeon: Fleeta Hanford Coy, MD;  Location[facility] OR;  Service: Thoracic;  Laterality: Right;   RIGHT HEART CATH AND CORONARY/GRAFT ANGIOGRAPHY N/A 05/31/2017   Procedure: RIGHT HEART CATH AND CORONARY/GRAFT ANGIOGRAPHY;  Surgeon: Cherrie Toribio JONELLE, MD;  Location: [facility] INVASIVE CV LAB;  Service: Cardiovascular;  Laterality: N/A;   TONSILLECTOMY      Social History:   reports that he has never smoked. He has never used smokeless tobacco. He reports that he does not currently use alcohol. He reports that he does not use drugs.  Allergies  Allergen Reactions   Ranolazine  Other (See Comments)    States he went into a coma and was in hospital for 3 days after taking   Procaine Swelling and Other (See Comments)   Amiodarone  Nausea And Vomiting and Other (See Comments)    Family History  Problem Relation Age of Onset   Alzheimer's disease Mother    COPD Daughter    Alzheimer's disease Other  COPD Sister    Depression Sister    Heart disease Sister    Hyperlipidemia Sister    Hypertension Sister    Esophageal cancer Sister        + smoker     Prior to Admission medications   Medication Sig Start Date End Date Taking? Authorizing Provider  acetaminophen  (TYLENOL ) 500 MG tablet Take 500 mg by mouth as needed for moderate pain (pain score 4-6), headache or mild pain (pain score 1-3).   Yes [provider]  albuterol  (VENTOLIN  HFA) 108 (90 Base) MCG/ACT inhaler Inhale 2 puffs into the lungs every 6 (six) hours as needed for wheezing or shortness of breath. 05/09/20  Yes [provider]  amiodarone  (PACERONE ) 200 MG tablet Take 2 tablets (400 mg total) by mouth 2 (two) times  daily. 04/04/23  Yes Ollis, Daphne CROME, NP  amoxicillin  (AMOXIL ) 500 MG capsule Take 4 capsules (2,000mg ) by mouth 30-60 minutes prior to dental work/cleanings. 10/02/22  Yes Camnitz, Soyla Lunger, MD  atorvastatin  (LIPITOR) 40 MG tablet Take 1 tablet (40 mg total) by mouth daily. Patient taking differently: Take 40 mg by mouth every evening. 10/08/22  Yes Turner, Wilbert SAUNDERS, MD  carvedilol  (COREG ) 25 MG tablet Take 25 mg by mouth in the morning and at bedtime.   Yes [provider]  ELIQUIS  2.5 MG TABS tablet TAKE 1 TABLET TWICE DAILY 07/02/22  Yes Turner, Wilbert SAUNDERS, MD  famotidine  (PEPCID ) 40 MG tablet Take 0.5 tablets (20 mg total) by mouth every evening. 04/02/23  Yes Camnitz, Will Lunger, MD  furosemide  (LASIX ) 40 MG tablet TAKE 1 TABLET EVERY DAY AS NEEDED FOR EDEMA Patient taking differently: Take 20-40 mg by mouth See admin instructions. Take 20 mg by mouth once daily on Tuesday, Wednesday, Thursday, Friday, Saturday, and Sunday. Take 40 mg by mouth once daily on Mondays 07/02/22  Yes Turner, Wilbert SAUNDERS, MD  isosorbide  mononitrate (IMDUR ) 30 MG 24 hr tablet Take 30 mg by mouth daily.   Yes [provider]  mexiletine (MEXITIL ) 150 MG capsule TAKE 2 CAPSULES BY MOUTH EVERY 12 HOURS. 04/12/23  Yes Tillery, Ozell Barter, PA-C  Multiple Vitamin (MULTIVITAMIN WITH MINERALS) TABS tablet Take 1 tablet by mouth in the morning.   Yes [provider]  pantoprazole  (PROTONIX ) 40 MG tablet Take 40 mg by mouth daily before breakfast.   Yes [provider]  Polyethyl Glycol-Propyl Glycol (SYSTANE OP) Place 1 drop into both eyes as needed (dry/irritated eyes.).   Yes [provider]  scopolamine  (TRANSDERM-SCOP) 1 MG/3DAYS Place 1 patch (1.5 mg total) onto the skin every 3 (three) days. 04/11/23  Yes Ollis, Daphne L, NP  sodium bicarbonate  650 MG tablet Take 650 mg by mouth in the morning.   Yes [provider]  VASCEPA  1 g capsule TAKE 2 CAPSULES BY MOUTH 2 TIMES DAILY.  02/15/23  Yes Shlomo Wilbert SAUNDERS, MD    Physical Exam: Vitals:   04/12/23 1715 04/12/23 1800 04/12/23 1926 04/12/23 2300  BP:   (!) 142/85 125/85  Pulse: 84 82 85 79  Resp: 20 (!) 22 20 17   Temp:   (!) 97.5 F (36.4 C) 97.9 F (36.6 C)  TempSrc:   Oral Oral  SpO2: 100% 98% 98% 97%  Weight:      Height:        Constitutional: NAD, no pallor or diaphoresis   Eyes: PERTLA, lids and conjunctivae normal ENMT: Mucous membranes are moist. Posterior pharynx clear of any exudate or  lesions.   Neck: supple, no masses  Respiratory: no wheezing, no crackles. No accessory muscle use.  Cardiovascular: Rate ~80. No JVD.  Abdomen: No distension, no tenderness, soft. Bowel sounds active.  Musculoskeletal: no clubbing / cyanosis. No joint deformity upper and lower extremities.   Skin: no significant rashes, lesions, ulcers. Warm, dry, well-perfused. Neurologic: CN 2-12 grossly intact. Moving all extremities. Resting tremor. Alert and oriented.  Psychiatric: Calm. Cooperative.    Labs and Imaging on Admission: I have personally reviewed following labs and imaging studies  CBC: Recent Labs  Lab 04/12/23 1639  WBC 9.4  NEUTROABS 6.4  HGB 14.7  HCT 45.7  MCV 93.5  PLT 292   Basic Metabolic Panel: Recent Labs  Lab 04/12/23 1729  NA 136  K 5.5*  CL 103  CO2 24  GLUCOSE 116*  BUN 26*  CREATININE 2.15*  CALCIUM  8.2*  MG 2.4   GFR: Estimated Creatinine Clearance: 26.1 mL/min (A) (by C-G formula based on SCr of 2.15 mg/dL (H)). Liver Function Tests: No results for input(s): AST, ALT, ALKPHOS, BILITOT, PROT, ALBUMIN in the last 168 hours. No results for input(s): LIPASE, AMYLASE in the last 168 hours. No results for input(s): AMMONIA in the last 168 hours. Coagulation Profile: No results for input(s): INR, PROTIME in the last 168 hours. Cardiac Enzymes: No results for input(s): CKTOTAL, CKMB, CKMBINDEX, TROPONINI in the last 168 hours. BNP (last 3  results) No results for input(s): PROBNP in the last 8760 hours. HbA1C: No results for input(s): HGBA1C in the last 72 hours. CBG: Recent Labs  Lab 04/12/23 1659  GLUCAP 123*   Lipid Profile: No results for input(s): CHOL, HDL, LDLCALC, TRIG, CHOLHDL, LDLDIRECT in the last 72 hours. Thyroid  Function Tests: No results for input(s): TSH, T4TOTAL, FREET4, T3FREE, THYROIDAB in the last 72 hours. Anemia Panel: No results for input(s): VITAMINB12, FOLATE, FERRITIN, TIBC, IRON, RETICCTPCT in the last 72 hours. Urine analysis:    Component Value Date/Time   COLORURINE YELLOW 03/16/2023 0544   APPEARANCEUR CLEAR 03/16/2023 0544   LABSPEC 1.016 03/16/2023 0544   PHURINE 5.0 03/16/2023 0544   GLUCOSEU NEGATIVE 03/16/2023 0544   HGBUR NEGATIVE 03/16/2023 0544   BILIRUBINUR NEGATIVE 03/16/2023 0544   KETONESUR NEGATIVE 03/16/2023 0544   PROTEINUR NEGATIVE 03/16/2023 0544   UROBILINOGEN 0.2 03/08/2008 1519   NITRITE NEGATIVE 03/16/2023 0544   LEUKOCYTESUR NEGATIVE 03/16/2023 0544   Sepsis Labs: @LABRCNTIP (procalcitonin:4,lacticidven:4) )No results found for this or any previous visit (from the past 240 hours).   Radiological Exams on Admission: DG Chest Port 1 View Result Date: 04/12/2023 CLINICAL DATA:  Shortness of breath EXAM: PORTABLE CHEST 1 VIEW COMPARISON:  03/19/2023 FINDINGS: Left chest wall CRT D. Sternotomy and CABG. Stable cardiomediastinal silhouette. Aortic atherosclerotic calcification. No focal consolidation, pleural effusion, or pneumothorax. Scarring in the right mid lung. No displaced rib fractures. IMPRESSION: No active disease. Electronically Signed   By: Norman Gatlin M.D.   On: 04/12/2023 19:33    EKG: Independently reviewed. Atrial fibrillation, LBBB.   Assessment/Plan   1. Ventricular tachycardia  - Appreciate cardiology consultation, amiodarone  infusion was started, addition of mexiletine    2. CAD  - Continue  beta-blocker and nitrate    3. HFrEF  - Appears compensated  - Continue Coreg  and Lasix     4. Atrial fibrillation  - Coreg , Eliquis    5. CKD 4; hyperkalemia  - SCr appears close to baseline, mild hyperkalemia noted  - Received Lokelma  in ED - Renally-dose medications, repeat chem  panel in am     DVT prophylaxis: Eliquis   Code Status: Full, discussed with patient on admission  Level of Care: Level of care: Progressive Family Communication: None present   Disposition Plan:  Patient is from: TBD Anticipated d/c is to: TBD Anticipated d/c date is: 16/25  Patient currently: Pending EP consultation, disposition planning  Consults called: Cardiology  Admission status: Inpatient     Evalene GORMAN Sprinkles, MD [facility]  04/13/2023, 1:46 AM

## 2023-04-13 NOTE — ED Notes (Signed)
 ED TO INPATIENT HANDOFF REPORT  ED Nurse Name and Phone #: Dajuan Turnley [phone]  S Name/Age/Gender [name] 89 y.o. male Room/Bed: 044C/044C  Code Status   Code Status: Full Code  Home/SNF/Other Home Patient oriented to: self, place, and situation Is this baseline? No   Triage Complete: Triage complete  Chief Complaint Ventricular tachycardia (HCC) [I47.20]  Triage Note Pt bib ems from home c/o increased weakness and tremors. Pt was recently d/c 12/25 for tremors and weakness. Pt had to have corrections to his pacemaker as well. Yesterday pt had the same symptoms from December. No CP and SOB. Demand pacer.  BP 148/86 HR 92 RA 98% CBG 228   Allergies Allergies  Allergen Reactions   Ranolazine  Other (See Comments)    States he went into a coma and was in hospital for 3 days after taking   Procaine Swelling and Other (See Comments)   Amiodarone  Nausea And Vomiting and Other (See Comments)    Level of Care/Admitting Diagnosis ED Disposition     ED Disposition  Admit   Condition  --   Comment  Hospital Area: [facility] [[id number]]  Level of Care: Progressive [102]  Admit to Progressive based on following criteria: CARDIOVASCULAR & THORACIC of moderate stability with acute coronary syndrome symptoms/low risk myocardial infarction/hypertensive urgency/arrhythmias/heart failure potentially compromising stability and stable post cardiovascular intervention patients.  May admit patient to [facility] or [facility] if equivalent level of care is available:: No  Covid Evaluation: Asymptomatic - no recent exposure (last 10 days) testing not required  Diagnosis: Ventricular tachycardia [facility]) [[id number]]  Admitting Physician: CHARLTON EVALENE RAMAN [[id number]]  Attending Physician: CHARLTON EVALENE RAMAN [[id number]]  Certification:: I certify this patient will need inpatient services for at least 2 midnights  Expected Medical Readiness: 04/14/2023           B Medical/Surgery History Past Medical History:  Diagnosis Date   Arthritis    minor everywhere (04/22/2015)   Asthma    a touch   Barrett esophagus    CAD (coronary artery disease)    a. s/p 3 vvessel CABG with LIMA to LAD, SVG to diagonal 1, SVG to RCA 12/09.   Cardiac resynchronization therapy defibrillator (CRT-D) in place    Chronic anticoagulation 01/23/2013   Chronic combined systolic and diastolic CHF (congestive heart failure) (HCC)    dry weight 197-200lbs.   CKD (chronic kidney disease) stage 3, GFR 30-59 ml/min (HCC) 08/23/2014   Complication of anesthesia    DCM (dilated cardiomyopathy) (HCC)    initially concerned for TTP amyloidosis but Tc-PYP scan 06/2017 was not consistent with amyloid and SPEP/UPEP with no M spike. EF 20-25% by echo 2019   Diverticulitis 08/2019   Dyslipidemia 01/23/2013   Essential hypertension 01/23/2013   GERD (gastroesophageal reflux disease)    GI bleed 05/08/2017   H/O: rheumatic fever    Hepatitis 1957   don't know what kind:   History of hiatal hernia    History of PFTs    a. Amiodarone  started 5/16 >> PFTs w/ DLCO 6/16:  FEV1 72% predicted, FEV1/FVC 66%, DLCO 66% >> minimal reversible obstructive airways disease with mild diffusion defect (suggestive of emphysema but absence of hyperinflation inconsistent with dx)   Hyperkalemia 08/23/2014   Hyperthyroidism 05/26/2017   Leg weakness 01/28/2017   Multiple thyroid  nodules 08/13/2017   Orthostatic hypotension    Persistent atrial fibrillation (HCC) 01/23/2013   Pleural effusion on right 05/26/2017   Pneumonia 08/2014   dr  thought I may have had a touch   Pre-diabetes    Protein-calorie malnutrition, severe 05/27/2017   S/P AVR (aortic valve replacement) 2009   a. severe AS s/p AVR with pericardial tissue valve 2009.   Shingles 08/23/2014   VT (ventricular tachycardia) (HCC) 05/06/2017   Past Surgical History:  Procedure Laterality Date   AORTIC VALVE REPLACEMENT  with 23-mm  Magna Ease pericardial valve, model number   with 23-mm Magna Ease pericardial valve, model [id number], serial number [id number]    APPENDECTOMY  1940s   BI-VENTRICULAR IMPLANTABLE CARDIOVERTER DEFIBRILLATOR  (CRT-D)  04/22/2015   BIV ICD GENERATOR CHANGEOUT N/A 09/18/2022   Procedure: BIV ICD GENERATOR CHANGEOUT;  Surgeon: Inocencio Soyla Lunger, MD;  Location: [facility] INVASIVE CV LAB;  Service: Cardiovascular;  Laterality: N/A;   CARDIAC CATHETERIZATION N/A 11/04/2014   Procedure: Left Heart Cath and Cors/Grafts Angiography;  Surgeon: Alm LELON Clay, MD;  Location: [facility] INVASIVE CV LAB;  Service: Cardiovascular;  Laterality: N/A;   CARDIAC VALVE REPLACEMENT     CARDIOVERSION N/A 01/27/2013   Procedure: CARDIOVERSION;  Surgeon: Wilbert JONELLE Bihari, MD;  Location: [facility] ENDOSCOPY;  Service: Cardiovascular;  Laterality: N/A;   CARDIOVERSION N/A 08/25/2014   Procedure: CARDIOVERSION;  Surgeon: Jerel Balding, MD;  Location: [facility] ENDOSCOPY;  Service: Cardiovascular;  Laterality: N/A;   CARDIOVERSION N/A 05/28/2018   Procedure: CARDIOVERSION;  Surgeon: Mona Vinie BROCKS, MD;  Location: [facility] ENDOSCOPY;  Service: Cardiovascular;  Laterality: N/A;   CARDIOVERSION N/A 10/28/2019   Procedure: CARDIOVERSION;  Surgeon: Bihari Wilbert JONELLE, MD;  Location: [facility] ENDOSCOPY;  Service: Cardiovascular;  Laterality: N/A;   CARDIOVERSION N/A 09/01/2020   Procedure: CARDIOVERSION;  Surgeon: Pietro Redell RAMAN, MD;  Location: [facility] ENDOSCOPY;  Service: Cardiovascular;  Laterality: N/A;   CARDIOVERSION N/A 04/04/2022   Procedure: CARDIOVERSION;  Surgeon: Mona Vinie BROCKS, MD;  Location: [facility] ENDOSCOPY;  Service: Cardiovascular;  Laterality: N/A;   CATARACT EXTRACTION W/ INTRAOCULAR LENS  IMPLANT, BILATERAL Bilateral    CHEST TUBE INSERTION Right 07/26/2017   Procedure: INSERTION PLEURAL DRAINAGE CATHETER - RIGHT;  Surgeon: Fleeta Hanford Coy, MD;  Location: [facility] OR;  Service: Thoracic;  Laterality: Right;   CORONARY ARTERY BYPASS GRAFT  03/10/2008   x 3 Dr.  Kerrin   EP IMPLANTABLE DEVICE N/A 04/22/2015   Procedure: BiV ICD Insertion CRT-D;  Surgeon: Will Lunger Inocencio, MD;  Location: [facility] INVASIVE CV LAB;  Service: Cardiovascular;  Laterality: N/A;   ESOPHAGOGASTRODUODENOSCOPY (EGD) WITH ESOPHAGEAL DILATION  X1   ESOPHAGOGASTRODUODENOSCOPY (EGD) WITH PROPOFOL  N/A 05/09/2017   Procedure: ESOPHAGOGASTRODUODENOSCOPY (EGD) WITH PROPOFOL ;  Surgeon: Rollin Dover, MD;  Location: [facility] ENDOSCOPY;  Service: Endoscopy;  Laterality: N/A;   IR THORACENTESIS ASP PLEURAL SPACE W/IMG GUIDE  05/27/2017   IR THORACENTESIS ASP PLEURAL SPACE W/IMG GUIDE  06/17/2017   IR THORACENTESIS ASP PLEURAL SPACE W/IMG GUIDE  06/18/2017   IR THORACENTESIS ASP PLEURAL SPACE W/IMG GUIDE  07/11/2017   REMOVAL OF PLEURAL DRAINAGE CATHETER Right 09/13/2017   Procedure: REMOVAL OF PLEURAL DRAINAGE CATHETER;  Surgeon: Fleeta Hanford Coy, MD;  Location: [facility] OR;  Service: Thoracic;  Laterality: Right;   RIGHT HEART CATH AND CORONARY/GRAFT ANGIOGRAPHY N/A 05/31/2017   Procedure: RIGHT HEART CATH AND CORONARY/GRAFT ANGIOGRAPHY;  Surgeon: Cherrie Toribio JONELLE, MD;  Location: [facility] INVASIVE CV LAB;  Service: Cardiovascular;  Laterality: N/A;   TONSILLECTOMY       A IV Location/Drains/Wounds Patient Lines/Drains/Airways Status     Active Line/Drains/Airways     Name Placement date Placement time Site Days  Peripheral IV 04/12/23 22 G Left;Posterior Forearm 04/12/23  1646  Forearm  1            Intake/Output Last 24 hours  Intake/Output Summary (Last 24 hours) at 04/13/2023 1137 Last data filed at 04/13/2023 0517 Gross per 24 hour  Intake 205.06 ml  Output 350 ml  Net -144.94 ml    Labs/Imaging Results for orders placed or performed during the hospital encounter of 04/12/23 (from the past 48 hours)  CBC WITH DIFFERENTIAL     Status: Abnormal   Collection Time: 04/12/23  4:39 PM  Result Value Ref Range   WBC 9.4 4.0 - 10.5 K/uL   RBC 4.89 4.22 - 5.81 MIL/uL   Hemoglobin 14.7 13.0 -  17.0 g/dL   HCT 54.2 60.9 - 47.9 %   MCV 93.5 80.0 - 100.0 fL   MCH 30.1 26.0 - 34.0 pg   MCHC 32.2 30.0 - 36.0 g/dL   RDW 84.1 (H) 88.4 - 84.4 %   Platelets 292 150 - 400 K/uL   nRBC 0.0 0.0 - 0.2 %   Neutrophils Relative % 69 %   Neutro Abs 6.4 1.7 - 7.7 K/uL   Lymphocytes Relative 14 %   Lymphs Abs 1.4 0.7 - 4.0 K/uL   Monocytes Relative 16 %   Monocytes Absolute 1.5 (H) 0.1 - 1.0 K/uL   Eosinophils Relative 0 %   Eosinophils Absolute 0.0 0.0 - 0.5 K/uL   Basophils Relative 1 %   Basophils Absolute 0.1 0.0 - 0.1 K/uL   Immature Granulocytes 0 %   Abs Immature Granulocytes 0.02 0.00 - 0.07 K/uL    Comment: Performed at [facility], [address], [place], [place] [address]  Troponin I (High Sensitivity)     Status: Abnormal   Collection Time: 04/12/23  4:39 PM  Result Value Ref Range   Troponin I (High Sensitivity) 26 (H) <18 ng/L    Comment: (NOTE) Elevated high sensitivity troponin I (hsTnI) values and significant  changes across serial measurements may suggest ACS but many other  chronic and acute conditions are known to elevate hsTnI results.  Refer to the Links section for chest pain algorithms and additional  guidance. Performed at [facility], [address], [place], [place] [address]   Brain natriuretic peptide     Status: Abnormal   Collection Time: 04/12/23  4:39 PM  Result Value Ref Range   B Natriuretic Peptide 597.6 (H) 0.0 - 100.0 pg/mL    Comment: Performed at [facility], [address], [place], [place] [address]  CBG monitoring, ED     Status: Abnormal   Collection Time: 04/12/23  4:59 PM  Result Value Ref Range   Glucose-Capillary 123 (H) 70 - 99 mg/dL    Comment: Glucose reference range applies only to samples taken after fasting for at least 8 hours.  Basic metabolic panel     Status: Abnormal   Collection Time: 04/12/23  5:29 PM  Result Value Ref Range   Sodium 136 135 - 145 mmol/L   Potassium 5.5 (H) 3.5 - 5.1 mmol/L     Comment: HEMOLYSIS AT THIS LEVEL MAY AFFECT RESULT   Chloride 103 98 - 111 mmol/L   CO2 24 22 - 32 mmol/L   Glucose, Bld 116 (H) 70 - 99 mg/dL    Comment: Glucose reference range applies only to samples taken after fasting for at least 8 hours.   BUN 26 (H) 8 -  23 mg/dL   Creatinine, Ser 7.84 (H) 0.61 - 1.24 mg/dL   Calcium  8.2 (L) 8.9 - 10.3 mg/dL   GFR, Estimated 29 (L) >60 mL/min    Comment: (NOTE) Calculated using the CKD-EPI Creatinine Equation (2021)    Anion gap 9 5 - 15    Comment: Performed at [facility], [address], [place], [place] [address]  Magnesium      Status: None   Collection Time: 04/12/23  5:29 PM  Result Value Ref Range   Magnesium  2.4 1.7 - 2.4 mg/dL    Comment: Performed at [facility], [address], [place], [place] [address]  Troponin I (High Sensitivity)     Status: Abnormal   Collection Time: 04/12/23  6:29 PM  Result Value Ref Range   Troponin I (High Sensitivity) 31 (H) <18 ng/L    Comment: (NOTE) Elevated high sensitivity troponin I (hsTnI) values and significant  changes across serial measurements may suggest ACS but many other  chronic and acute conditions are known to elevate hsTnI results.  Refer to the Links section for chest pain algorithms and additional  guidance. Performed at [facility], [address], [place], [place] [address]   Basic metabolic panel     Status: Abnormal   Collection Time: 04/13/23  2:38 AM  Result Value Ref Range   Sodium 136 135 - 145 mmol/L   Potassium 3.9 3.5 - 5.1 mmol/L   Chloride 100 98 - 111 mmol/L   CO2 22 22 - 32 mmol/L   Glucose, Bld 163 (H) 70 - 99 mg/dL    Comment: Glucose reference range applies only to samples taken after fasting for at least 8 hours.   BUN 24 (H) 8 - 23 mg/dL   Creatinine, Ser 7.75 (H) 0.61 - 1.24 mg/dL   Calcium  8.4 (L) 8.9 - 10.3 mg/dL   GFR, Estimated 28 (L) >60 mL/min    Comment: (NOTE) Calculated using the CKD-EPI Creatinine Equation (2021)     Anion gap 14 5 - 15    Comment: Performed at [facility], [address], [place], [place] [address]  CBC     Status: None   Collection Time: 04/13/23  2:38 AM  Result Value Ref Range   WBC 10.5 4.0 - 10.5 K/uL   RBC 4.36 4.22 - 5.81 MIL/uL   Hemoglobin 13.1 13.0 - 17.0 g/dL   HCT 58.7 60.9 - 47.9 %   MCV 94.5 80.0 - 100.0 fL   MCH 30.0 26.0 - 34.0 pg   MCHC 31.8 30.0 - 36.0 g/dL   RDW 84.6 88.4 - 84.4 %   Platelets 282 150 - 400 K/uL   nRBC 0.0 0.0 - 0.2 %    Comment: Performed at [facility] Lab, [address] [place], [place] [address]   DG Chest Port 1 View Result Date: 04/12/2023 CLINICAL DATA:  Shortness of breath EXAM: PORTABLE CHEST 1 VIEW COMPARISON:  03/19/2023 FINDINGS: Left chest wall CRT D. Sternotomy and CABG. Stable cardiomediastinal silhouette. Aortic atherosclerotic calcification. No focal consolidation, pleural effusion, or pneumothorax. Scarring in the right mid lung. No displaced rib fractures. IMPRESSION: No active disease. Electronically Signed   By: Norman Gatlin M.D.   On: 04/12/2023 19:33    Pending Labs Unresulted Labs (From admission, onward)     Start     Ordered   04/13/23 0500  Basic metabolic panel  Daily,   R  04/13/23 0145   04/13/23 0500  CBC  Daily,   R      04/13/23 0145   04/12/23 1632  Amiodarone  level  Once,   URGENT        04/12/23 1631            Vitals/Pain Today's Vitals   04/13/23 0846 04/13/23 0900 04/13/23 0928 04/13/23 1000  BP:  (!) 119/55  116/71  Pulse:  85  78  Resp:  17  15  Temp:   97.7 F (36.5 C)   TempSrc:   Oral   SpO2:  95%  97%  Weight:      Height:      PainSc: 0-No pain       Isolation Precautions No active isolations  Medications Medications  amiodarone  (NEXTERONE  PREMIX) 360-4.14 MG/200ML-% (1.8 mg/mL) IV infusion (0 mg/hr Intravenous Stopped 04/13/23 0517)  amiodarone  (NEXTERONE  PREMIX) 360-4.14 MG/200ML-% (1.8 mg/mL) IV infusion (30 mg/hr Intravenous New Bag/Given 04/13/23 0516)   atorvastatin  (LIPITOR) tablet 40 mg (has no administration in time range)  furosemide  (LASIX ) tablet 20 mg (20 mg Oral Given 04/13/23 0929)  isosorbide  mononitrate (IMDUR ) 24 hr tablet 30 mg (30 mg Oral Given 04/13/23 0929)  famotidine  (PEPCID ) tablet 20 mg (has no administration in time range)  pantoprazole  (PROTONIX ) EC tablet 40 mg (40 mg Oral Given 04/13/23 0929)  scopolamine  (TRANSDERM-SCOP) 1 MG/3DAYS 1.5 mg (1.5 mg Transdermal Patch Applied 04/13/23 1010)  apixaban  (ELIQUIS ) tablet 2.5 mg (2.5 mg Oral Given 04/13/23 0929)  albuterol  (PROVENTIL ) (2.5 MG/3ML) 0.083% nebulizer solution 2.5 mg (has no administration in time range)  sodium chloride  flush (NS) 0.9 % injection 3 mL (3 mLs Intravenous Not Given 04/13/23 9077)  acetaminophen  (TYLENOL ) tablet 650 mg (has no administration in time range)    Or  acetaminophen  (TYLENOL ) suppository 650 mg (has no administration in time range)  senna-docusate (Senokot-S) tablet 1 tablet (has no administration in time range)  sodium zirconium cyclosilicate  (LOKELMA ) packet 10 g (10 g Oral Given 04/12/23 1927)  furosemide  (LASIX ) injection 40 mg (40 mg Intravenous Given 04/12/23 1927)    Mobility non-ambulatory (has not ambulated in ED on this shift)     Focused Assessments Neuro Assessment Handoff:   Cardiac Rhythm: Ventricular paced       Neuro Assessment: Exceptions to WDL   R Recommendations: See Admitting Provider Note  Report given to:   Additional Notes:

## 2023-04-13 NOTE — Consult Note (Signed)
 ELECTROPHYSIOLOGY CONSULT NOTE  Patient ID: [name], MRN: [id number], DOB/AGE: 1933-01-12 89 y.o. Admit date: 04/12/2023 Date of Consult: 04/13/2023  Primary Physician: Nanci Senior, MD Primary Cardiologist: TT EP [facility] [name] is a 89 y.o. male who is being seen today for the evaluation of FTT  at the request of Dr yolande.   Chief Complaint: nausea and vomiting   HPI JHAIR WITHERINGTON is a 89 y.o. male with ischemic cardiomyopathy with a previously implanted to CRT-D.  Prior bypass surgery aortic valve replacement 2009.  Permanent atrial fibrillation on Eliquis .  Recent hospitalization 12/10--12/24 for VT and VT storm prompting discontinuation of sotalol  and initiation of amiodarone  and the continuing of mexiletine despite which he continued to have VT treated with ATP.  Poorly tolerated medications secondary to nausea and progressive weakness.  Amiodarone  doses were decreased and he presents to the emergency room 04/12/2023 for failure to thrive   He states that over the days following progressive weakness nausea and some vomiting every time he took the amiodarone .  He was started on a scopolamine  patch (B0-NP) which has eliminated his nausea.  He is currently taking his amiodarone  by vein     DATE TEST EF   5/21 Echo   30-35 %              Date Cr K Hgb  5/24 2.19 4.3 13.7  1/25 2.15 3.9<<5.5 13.1    Past Medical History:  Diagnosis Date   Arthritis    minor everywhere (04/22/2015)   Asthma    a touch   Barrett esophagus    CAD (coronary artery disease)    a. s/p 3 vvessel CABG with LIMA to LAD, SVG to diagonal 1, SVG to RCA 12/09.   Cardiac resynchronization therapy defibrillator (CRT-D) in place    Chronic anticoagulation 01/23/2013   Chronic combined systolic and diastolic CHF (congestive heart failure) (HCC)    dry weight 197-200lbs.   CKD (chronic kidney disease) stage 3, GFR 30-59 ml/min (HCC) 08/23/2014   Complication of anesthesia    DCM  (dilated cardiomyopathy) (HCC)    initially concerned for TTP amyloidosis but Tc-PYP scan 06/2017 was not consistent with amyloid and SPEP/UPEP with no M spike. EF 20-25% by echo 2019   Diverticulitis 08/2019   Dyslipidemia 01/23/2013   Essential hypertension 01/23/2013   GERD (gastroesophageal reflux disease)    GI bleed 05/08/2017   H/O: rheumatic fever    Hepatitis 1957   don't know what kind:   History of hiatal hernia    History of PFTs    a. Amiodarone  started 5/16 >> PFTs w/ DLCO 6/16:  FEV1 72% predicted, FEV1/FVC 66%, DLCO 66% >> minimal reversible obstructive airways disease with mild diffusion defect (suggestive of emphysema but absence of hyperinflation inconsistent with dx)   Hyperkalemia 08/23/2014   Hyperthyroidism 05/26/2017   Leg weakness 01/28/2017   Multiple thyroid  nodules 08/13/2017   Orthostatic hypotension    Persistent atrial fibrillation (HCC) 01/23/2013   Pleural effusion on right 05/26/2017   Pneumonia 08/2014   dr thought I may have had a touch   Pre-diabetes    Protein-calorie malnutrition, severe 05/27/2017   S/P AVR (aortic valve replacement) 2009   a. severe AS s/p AVR with pericardial tissue valve 2009.   Shingles 08/23/2014   VT (ventricular tachycardia) (HCC) 05/06/2017       Surgical History:  Past Surgical History:  Procedure Laterality Date   AORTIC VALVE REPLACEMENT  with  23-mm Magna Ease pericardial valve, model number   with 23-mm Magna Ease pericardial valve, model number3300TFX, serial number [id number]    APPENDECTOMY  1940s   BI-VENTRICULAR IMPLANTABLE CARDIOVERTER DEFIBRILLATOR  (CRT-D)  04/22/2015   BIV ICD GENERATOR CHANGEOUT N/A 09/18/2022   Procedure: BIV ICD GENERATOR CHANGEOUT;  Surgeon: Inocencio Soyla Lunger, MD;  Location: [facility] INVASIVE CV LAB;  Service: Cardiovascular;  Laterality: N/A;   CARDIAC CATHETERIZATION N/A 11/04/2014   Procedure: Left Heart Cath and Cors/Grafts Angiography;  Surgeon: Alm LELON Clay, MD;  Location: [facility] INVASIVE  CV LAB;  Service: Cardiovascular;  Laterality: N/A;   CARDIAC VALVE REPLACEMENT     CARDIOVERSION N/A 01/27/2013   Procedure: CARDIOVERSION;  Surgeon: Wilbert JONELLE Bihari, MD;  Location: [facility] ENDOSCOPY;  Service: Cardiovascular;  Laterality: N/A;   CARDIOVERSION N/A 08/25/2014   Procedure: CARDIOVERSION;  Surgeon: Jerel Balding, MD;  Location: [facility] ENDOSCOPY;  Service: Cardiovascular;  Laterality: N/A;   CARDIOVERSION N/A 05/28/2018   Procedure: CARDIOVERSION;  Surgeon: Mona Vinie BROCKS, MD;  Location: [facility] ENDOSCOPY;  Service: Cardiovascular;  Laterality: N/A;   CARDIOVERSION N/A 10/28/2019   Procedure: CARDIOVERSION;  Surgeon: Bihari Wilbert JONELLE, MD;  Location: [facility] ENDOSCOPY;  Service: Cardiovascular;  Laterality: N/A;   CARDIOVERSION N/A 09/01/2020   Procedure: CARDIOVERSION;  Surgeon: Pietro Redell RAMAN, MD;  Location: [facility] ENDOSCOPY;  Service: Cardiovascular;  Laterality: N/A;   CARDIOVERSION N/A 04/04/2022   Procedure: CARDIOVERSION;  Surgeon: Mona Vinie BROCKS, MD;  Location: [facility] ENDOSCOPY;  Service: Cardiovascular;  Laterality: N/A;   CATARACT EXTRACTION W/ INTRAOCULAR LENS  IMPLANT, BILATERAL Bilateral    CHEST TUBE INSERTION Right 07/26/2017   Procedure: INSERTION PLEURAL DRAINAGE CATHETER - RIGHT;  Surgeon: Fleeta Hanford Coy, MD;  Location: [facility] OR;  Service: Thoracic;  Laterality: Right;   CORONARY ARTERY BYPASS GRAFT  03/10/2008   x 3 Dr. Kerrin   EP IMPLANTABLE DEVICE N/A 04/22/2015   Procedure: BiV ICD Insertion CRT-D;  Surgeon: Will Lunger Inocencio, MD;  Location: [facility] INVASIVE CV LAB;  Service: Cardiovascular;  Laterality: N/A;   ESOPHAGOGASTRODUODENOSCOPY (EGD) WITH ESOPHAGEAL DILATION  X1   ESOPHAGOGASTRODUODENOSCOPY (EGD) WITH PROPOFOL  N/A 05/09/2017   Procedure: ESOPHAGOGASTRODUODENOSCOPY (EGD) WITH PROPOFOL ;  Surgeon: Rollin Dover, MD;  Location: [facility] ENDOSCOPY;  Service: Endoscopy;  Laterality: N/A;   IR THORACENTESIS ASP PLEURAL SPACE W/IMG GUIDE  05/27/2017   IR THORACENTESIS ASP PLEURAL SPACE  W/IMG GUIDE  06/17/2017   IR THORACENTESIS ASP PLEURAL SPACE W/IMG GUIDE  06/18/2017   IR THORACENTESIS ASP PLEURAL SPACE W/IMG GUIDE  07/11/2017   REMOVAL OF PLEURAL DRAINAGE CATHETER Right 09/13/2017   Procedure: REMOVAL OF PLEURAL DRAINAGE CATHETER;  Surgeon: Fleeta Hanford Coy, MD;  Location: [facility] OR;  Service: Thoracic;  Laterality: Right;   RIGHT HEART CATH AND CORONARY/GRAFT ANGIOGRAPHY N/A 05/31/2017   Procedure: RIGHT HEART CATH AND CORONARY/GRAFT ANGIOGRAPHY;  Surgeon: Cherrie Toribio JONELLE, MD;  Location: [facility] INVASIVE CV LAB;  Service: Cardiovascular;  Laterality: N/A;   TONSILLECTOMY        Current Meds  Medication Sig   acetaminophen  (TYLENOL ) 500 MG tablet Take 500 mg by mouth as needed for moderate pain (pain score 4-6), headache or mild pain (pain score 1-3).   albuterol  (VENTOLIN  HFA) 108 (90 Base) MCG/ACT inhaler Inhale 2 puffs into the lungs every 6 (six) hours as needed for wheezing or shortness of breath.   amiodarone  (PACERONE ) 200 MG tablet Take 2 tablets (400 mg total) by mouth 2 (two) times daily.   amoxicillin  (AMOXIL ) 500 MG capsule Take  4 capsules (2,000mg ) by mouth 30-60 minutes prior to dental work/cleanings.   atorvastatin  (LIPITOR) 40 MG tablet Take 1 tablet (40 mg total) by mouth daily. (Patient taking differently: Take 40 mg by mouth every evening.)   carvedilol  (COREG ) 25 MG tablet Take 25 mg by mouth in the morning and at bedtime.   ELIQUIS  2.5 MG TABS tablet TAKE 1 TABLET TWICE DAILY   famotidine  (PEPCID ) 40 MG tablet Take 0.5 tablets (20 mg total) by mouth every evening.   furosemide  (LASIX ) 40 MG tablet TAKE 1 TABLET EVERY DAY AS NEEDED FOR EDEMA (Patient taking differently: Take 20-40 mg by mouth See admin instructions. Take 20 mg by mouth once daily on Tuesday, Wednesday, Thursday, Friday, Saturday, and Sunday. Take 40 mg by mouth once daily on Mondays)   isosorbide  mononitrate (IMDUR ) 30 MG 24 hr tablet Take 30 mg by mouth daily.   mexiletine (MEXITIL ) 150 MG capsule  TAKE 2 CAPSULES BY MOUTH EVERY 12 HOURS.   Multiple Vitamin (MULTIVITAMIN WITH MINERALS) TABS tablet Take 1 tablet by mouth in the morning.   pantoprazole  (PROTONIX ) 40 MG tablet Take 40 mg by mouth daily before breakfast.   Polyethyl Glycol-Propyl Glycol (SYSTANE OP) Place 1 drop into both eyes as needed (dry/irritated eyes.).   scopolamine  (TRANSDERM-SCOP) 1 MG/3DAYS Place 1 patch (1.5 mg total) onto the skin every 3 (three) days.   sodium bicarbonate  650 MG tablet Take 650 mg by mouth in the morning.   VASCEPA  1 g capsule TAKE 2 CAPSULES BY MOUTH 2 TIMES DAILY.     Inpatient Medications:   apixaban   2.5 mg Oral BID   atorvastatin   40 mg Oral QPM   famotidine   20 mg Oral QPM   furosemide   20 mg Oral Daily   isosorbide  mononitrate  30 mg Oral Daily   pantoprazole   40 mg Oral QAC breakfast   scopolamine   1 patch Transdermal Q72H   sodium chloride  flush  3 mL Intravenous Q12H      Allergies:  Allergies  Allergen Reactions   Ranolazine  Other (See Comments)    States he went into a coma and was in hospital for 3 days after taking   Procaine Swelling and Other (See Comments)   Amiodarone  Nausea And Vomiting and Other (See Comments)    Social History   Socioeconomic History   Marital status: Married    Spouse name: Not on file   Number of children: Not on file   Years of education: Not on file   Highest education level: Not on file  Occupational History   Not on file  Tobacco Use   Smoking status: Never   Smokeless tobacco: Never   Tobacco comments:    Never smoke 04/17/22  Vaping Use   Vaping status: Never Used  Substance and Sexual Activity   Alcohol use: Not Currently    Comment: 04/22/2015 nothing since the 1980s; never had a problem w/it   Drug use: No   Sexual activity: Not Currently  Other Topics Concern   Not on file  Social History Narrative   Married, lives with spouse Claude   4 children, 2 boys and 2 girls > one daughter passed   OCCUPATION: retired,  nurse, learning disability for [employer]   Social Drivers of Sungard Resource Strain: Not on file  Food Insecurity: No Food Insecurity (03/19/2023)   Hunger Vital Sign    Worried About Running Out of Food in the Last Year: Never true  Ran Out of Food in the Last Year: Never true  Transportation Needs: No Transportation Needs (03/19/2023)   PRAPARE - Administrator, Civil Service (Medical): No    Lack of Transportation (Non-Medical): No  Physical Activity: Not on file  Stress: Not on file  Social Connections: Not on file  Intimate Partner Violence: Not At Risk (03/19/2023)   Humiliation, Afraid, Rape, and Kick questionnaire    Fear of Current or Ex-Partner: No    Emotionally Abused: No    Physically Abused: No    Sexually Abused: No     Family History  Problem Relation Age of Onset   Alzheimer's disease Mother    COPD Daughter    Alzheimer's disease Other    COPD Sister    Depression Sister    Heart disease Sister    Hyperlipidemia Sister    Hypertension Sister    Esophageal cancer Sister        + smoker     ROS:  Please see the history of present illness.     All other systems reviewed and negative.    Physical Exam: Blood pressure 130/86, pulse 87, temperature 97.9 F (36.6 C), temperature source Oral, resp. rate 20, height 6' (1.829 m), weight 85.3 kg, SpO2 99%. General: Well developed, well nourished male in no acute distress. Head: Normocephalic, atraumatic, sclera non-icteric, no xanthomas, nares are without discharge. EENT: normal Lymph Nodes:  none Back: without scoliosis/kyphosis, no CVA tendersness Neck: Negative for carotid bruits. JVD not elevated. Lungs: Clear bilaterally to auscultation without wheezes, rales, or rhonchi. Breathing is unlabored. Heart: RRR with S1 S2. 2/6 murmur , rubs, or gallops appreciated. Abdomen: Soft, non-tender, non-distended with normoactive bowel sounds. No hepatomegaly. No rebound/guarding. No obvious  abdominal masses. Msk:  Strength and tone appear normal for age. Extremities: No clubbing or cyanosis. No  edema.  Distal pedal pulses are 2+ and equal bilaterally. Skin: Warm and Dry Neuro: Alert and oriented X 3. CN III-XII intact Grossly normal sensory and motor function . Psych:  Responds to questions appropriately with a normal affect.      Labs: Cardiac Enzymes No results for input(s): CKTOTAL, CKMB, TROPONINI in the last 72 hours. CBC Lab Results  Component Value Date   WBC 10.5 04/13/2023   HGB 13.1 04/13/2023   HCT 41.2 04/13/2023   MCV 94.5 04/13/2023   PLT 282 04/13/2023   PROTIME: No results for input(s): LABPROT, INR in the last 72 hours. Chemistry  Recent Labs  Lab 04/13/23 0238  NA 136  K 3.9  CL 100  CO2 22  BUN 24*  CREATININE 2.24*  CALCIUM  8.4*  GLUCOSE 163*   Lipids Lab Results  Component Value Date   CHOL 106 06/27/2021   HDL 38 (L) 06/27/2021   LDLCALC 37 06/27/2021   TRIG 193 (H) 06/27/2021        EKG: Atrial fibrillation with intermittent ventricular pacing   Assessment and Plan:  Ventricular tachycardia-recurrent  GI intolerance secondary to amiodarone /mexiletine  Ischemic cardiomyopathy-CABG and prior AVR  Renal insufficiency chronic Estimated Creatinine Clearance: 25 mL/min (A) (by C-G formula based on SCr of 2.24 mg/dL (H)).  Grade 4  ICD-CRT-D-Medtronic  Atrial fibrillation-permanent (ventricular pacing 92% 9/24 need repeat)   Apparently really severe nausea and vomiting but surprisingly and pleasantly so he was prescribed a scopolamine  patch which he says has eliminated his nausea.  We will continue this and we will reintroduce his amiodarone  by mouth tomorrow, continuing it by vein  for today, in the hopes that he will be able to tolerate it.  The amiodarone  was resumed 12/24 when he presented as above with VT, previously having been associated with nausea and vomiting as well as concerns of dyspnea although  pulmonary toxicity was apparently excluded these things back in 20 16-19.  In the event that he can tolerate the amiodarone  with the scopolamine  we will continue it.  In the event that we cannot his medication options are extremely limited.  His coronary disease precludes a 1C, his renal function precludes a class III.  Echo to reassess left ventricular function.  Perhaps this will give us  some insight as to what triggered the VT.  I also am concerned that ischemia could be part of this, and this maintains some value for the mexiletine.  Empiric anti-ischemic therapy perhaps augmenting his Imdur  and maintain his carvedilol  would be appropriate.  Continue Eliquis    Elspeth Sage

## 2023-04-13 NOTE — ED Notes (Signed)
 Admitting provider at bedside.

## 2023-04-14 ENCOUNTER — Inpatient Hospital Stay (HOSPITAL_COMMUNITY): Payer: Medicare HMO

## 2023-04-14 DIAGNOSIS — I4821 Permanent atrial fibrillation: Secondary | ICD-10-CM

## 2023-04-14 DIAGNOSIS — I472 Ventricular tachycardia, unspecified: Secondary | ICD-10-CM | POA: Diagnosis not present

## 2023-04-14 DIAGNOSIS — I4729 Other ventricular tachycardia: Secondary | ICD-10-CM | POA: Diagnosis not present

## 2023-04-14 LAB — BASIC METABOLIC PANEL
Anion gap: 15 (ref 5–15)
BUN: 25 mg/dL — ABNORMAL HIGH (ref 8–23)
CO2: 22 mmol/L (ref 22–32)
Calcium: 8.7 mg/dL — ABNORMAL LOW (ref 8.9–10.3)
Chloride: 96 mmol/L — ABNORMAL LOW (ref 98–111)
Creatinine, Ser: 2.27 mg/dL — ABNORMAL HIGH (ref 0.61–1.24)
GFR, Estimated: 27 mL/min — ABNORMAL LOW (ref >60)
Glucose, Bld: 129 mg/dL — ABNORMAL HIGH (ref 70–99)
Potassium: 3.7 mmol/L (ref 3.5–5.1)
Sodium: 133 mmol/L — ABNORMAL LOW (ref 135–145)

## 2023-04-14 LAB — URINALYSIS, ROUTINE W REFLEX MICROSCOPIC
Bilirubin Urine: NEGATIVE
Glucose, UA: NEGATIVE mg/dL
Hgb urine dipstick: NEGATIVE
Ketones, ur: NEGATIVE mg/dL
Leukocytes,Ua: NEGATIVE
Nitrite: NEGATIVE
Protein, ur: NEGATIVE mg/dL
Specific Gravity, Urine: 1.017 (ref 1.005–1.030)
pH: 5 (ref 5.0–8.0)

## 2023-04-14 LAB — CBC
HCT: 40.9 % (ref 39.0–52.0)
Hemoglobin: 13.2 g/dL (ref 13.0–17.0)
MCH: 29.4 pg (ref 26.0–34.0)
MCHC: 32.3 g/dL (ref 30.0–36.0)
MCV: 91.1 fL (ref 80.0–100.0)
Platelets: 313 10*3/uL (ref 150–400)
RBC: 4.49 MIL/uL (ref 4.22–5.81)
RDW: 15 % (ref 11.5–15.5)
WBC: 13.1 10*3/uL — ABNORMAL HIGH (ref 4.0–10.5)
nRBC: 0 % (ref 0.0–0.2)

## 2023-04-14 LAB — ECHOCARDIOGRAM COMPLETE
AR max vel: 1.74 cm2
AV Area VTI: 1.82 cm2
AV Area mean vel: 1.63 cm2
AV Mean grad: 4 mm[Hg]
AV Peak grad: 6.2 mm[Hg]
Ao pk vel: 1.24 m/s
Area-P 1/2: 6.12 cm2
Calc EF: 38.1 %
Height: 72 in
MV VTI: 1.82 cm2
S' Lateral: 4.7 cm
Single Plane A2C EF: 40.9 %
Single Plane A4C EF: 35.3 %
Weight: 3114.66 [oz_av]

## 2023-04-14 MED ORDER — ONDANSETRON HCL 4 MG/2ML IJ SOLN
4.0000 mg | Freq: Four times a day (QID) | INTRAMUSCULAR | Status: DC | PRN
  Administered 2023-04-19: 4 mg via INTRAVENOUS
  Filled 2023-04-14: qty 2

## 2023-04-14 MED ORDER — SODIUM CHLORIDE 0.9 % IV SOLN: 12.5000 mg | Freq: Four times a day (QID) | INTRAVENOUS | Status: DC | PRN

## 2023-04-14 MED ORDER — SODIUM CHLORIDE 0.9 % IV SOLN: INTRAVENOUS | Status: DC

## 2023-04-14 MED ORDER — HYALURONIDASE HUMAN 150 UNIT/ML IJ SOLN
150.0000 [IU] | Freq: Once | INTRAMUSCULAR | Status: AC
  Administered 2023-04-14: 150 [IU] via SUBCUTANEOUS
  Filled 2023-04-14: qty 1

## 2023-04-14 NOTE — Progress Notes (Deleted)
 Electrophysiology Office Note:   ID:  [name], DOB 1932-08-16, MRN [id number]  Primary Cardiologist: Wilbert Bihari, MD Electrophysiologist: Soyla Gladis Norton, MD  {Click to update primary MD,subspecialty MD or APP then REFRESH:1}    History of Present Illness:   [name] is a 89 y.o. male with h/o *** seen today for post hospital follow up.    Admitted 12/10-12/24 for VT storm. The patient was previously seen 12/7 with complaints of weakness and dizziness and felt to be orthostatic, though device interrogation showed 3 episodes of VT treated with ATP.   Seen in clinic 12/10 and on the way to the visit had lightheadedness, dizziness, and vision going black. Device interrogation demonstrated VT storm requiring ATP multiple times and he was sent to the ED for further work up and management.    Sotalol  was stopped due to ineffectiveness, and transitioned to amiodarone  for load. Mexiletine increased as well.  He continued to have ATP despite amiodarone , requiring reloading with IV.   Due to his advanced age and co-morbidities, he was not felt to be a candidate for invasive EP procedures. Device required reprogramming several times due to slower and slower VT, ultimately programming his lower zone to ATP at 115 bpm.    Goals of care conversation were visited. Pt and family wished to continue full code and full scope of care. The patient did verbalize understanding that there are no further options for escalation of his care.    Pt did have profound hypotension which led to cessation of his BB.    Ultimately, the patients goal was to make it home for the holiday. He was evaluated and deemed to need [facility] RN and PT which were arranged. He was monitored on telemetry which only showed rare NSVT the evening prior to discharge  Since discharge from hospital the patient reports doing ***. Called last week with persistent nausea ***  he denies chest pain, palpitations, dyspnea, PND, orthopnea,  nausea, vomiting, dizziness, syncope, edema, weight gain, or early satiety.   Review of systems complete and found to be negative unless listed in HPI.   EP Information / Studies Reviewed:    EKG is not ordered today. EKG from 04/11/2022 reviewed which showed AF with likely NSVT       ICD Interrogation-  reviewed in detail today,  See PACEART report.  Device History: {INDUSTRY:28136} {Blank single:19197::Dual Chamber,Single Chamber,BiV,S-ICD} ICD implanted *** for *** History of appropriate therapy: {yes/no:20286} History of AAD therapy: {Blank single:19197::No,Yes; currently on ***,Yes; previously tolerated ***}   ***  Physical Exam:   VS:  There were no vitals taken for this visit.   Wt Readings from Last 3 Encounters:  04/14/23 194 lb 10.7 oz (88.3 kg)  03/29/23 197 lb 5 oz (89.5 kg)  03/19/23 197 lb (89.4 kg)     GEN: Well nourished, well developed in no acute distress NECK: No JVD; No carotid bruits CARDIAC: {EPRHYTHM:28826}, no murmurs, rubs, gallops RESPIRATORY:  Clear to auscultation without rales, wheezing or rhonchi  ABDOMEN: Soft, non-tender, non-distended EXTREMITIES:  No edema; No deformity   ASSESSMENT AND PLAN:    Chronic systolic CHF  s/p Medtronic CRT-D  euvolemic today Stable on an appropriate medical regimen Normal ICD function See Pace Art report No changes today  VT storm ***  Continue PO amiodarone  200 mg TID Continue 150 mg BID mexiletine No other medication options   Permanent atrial fibrillation Continue Eliquis    Chronic systolic heart failure Volume status ***  Continue current medications   Coronary artery disease:  Denies s/s ischemia  Continue antianginals.   Bioprosthetic AVR Functioning appropriately on most recent echo   CKD stage IV Cr Stable  Goals of Care ***  Disposition:   Follow up with {EPPROVIDERS:28135} {EPFOLLOW UP:28173}   Signed, Ozell Prentice Passey, PA-C

## 2023-04-14 NOTE — Progress Notes (Addendum)
 Patient Name: [name] Date of Encounter: 04/15/2023  Primary Cardiologist: [name] Bihari, MD Electrophysiologist: Soyla [name] Norton, MD  Interval Summary   Pt reports he lost his room last night > notes he felt confused but is feeling better this am.  Continues to have nausea / belching. Feels like he is short of breath this am - starting approx 1 hour ago   Vital Signs    Vitals:   04/14/23 2327 04/15/23 0310 04/15/23 0523 04/15/23 0812  BP: (!) 146/80 135/77  132/85  Pulse: 87 94  96  Resp: 14 13  19   Temp: 97.8 F (36.6 C) 97.8 F (36.6 C)  97.7 F (36.5 C)  TempSrc: Oral Oral  Oral  SpO2: 97% 94%  98%  Weight:   89.3 kg   Height:        Intake/Output Summary (Last 24 hours) at 04/15/2023 0839 Last data filed at 04/15/2023 9376 Gross per 24 hour  Intake 1380.87 ml  Output 800 ml  Net 580.87 ml   Filed Weights   04/12/23 1602 04/14/23 0551 04/15/23 0523  Weight: 85.3 kg 88.3 kg 89.3 kg    Physical Exam    GEN- The patient is well appearing, alert and oriented x 3 today.   Lungs- normal effort, right basilar crackles Cardiac- Regular rate and rhythm, no murmurs, rubs or gallops GI- soft, NT, ND, + BS Extremities- no clubbing or cyanosis. No edema  Telemetry    VP 80's - 105 bpm, brief NSVT episodes, ATP episodes  (personally reviewed)  Hospital Course    [name] is a 89 y.o. male with PMH of permanent AF, sCHF s/p ICD, VT, CAD s/p CABG with AVR admitted 04/13/23 for VT storm.  Recent prolonged hospitalization for VT storm and he was restarted on amiodarone  > slowed VT but continued to have recurrent bouts treated with ATP. His VT slowed to the 120's and VT zones were adjusted.  He was discharged on PO amio but outpatient course was complicated by nausea/vomiting and progressive weakness.   Assessment & Plan    Recurrent VT  ICM s/p CABG & AVR (remote) -hold amio  -resume mexiletine 250mg  BID -begin quinidine 200 mg BID  -tele monitoring   -issues with nausea with amio / mexiletine but limited options to treat VT at this point   Nausea / Vomiting  -confusion with scopolamine  > stopped 1/5 -phenergan  PRN  -ok to zofran  sparingly if needed   Permanent AF  -OAC for stroke prophylaxis   Secondary Hypercoagulable State  -continue Eliquis  2.5mg  BID, dose reviewed and appropriate by age/cr   Confusion  -thought secondary to scopolamine   -WBC elevated > defer work up to TRH   For questions or updates, please contact [facility] Please consult [email or web address] for contact info under Cardiology/STEMI.  Signed, [name] Barrack, MSN, APRN, NP-C, AGACNP-BC [facility] - Electrophysiology  04/15/2023, 8:39 AM  I have seen and examined this patient with [name].  Agree with above, note added to reflect my findings.  Patient continuing to have episodes of VT, though quite slow.  No acute complaints other than mild shortness of breath.  GEN: Well nourished, well developed, in no acute distress  HEENT: normal  Neck: no JVD, carotid bruits, or masses Cardiac: Irregular ; no murmurs, rubs, or gallops,no edema  Respiratory: Crackles at the bases  GI: soft, nontender, nondistended, + BS MS: no deformity or atrophy  Skin: warm and dry, device site well healed Neuro:  Strength and sensation are intact Psych: euthymic mood, full affect   VT storm: Is continue to have episodes of ventricular tachycardia.  Having significant side effects on amiodarone .  [name] stop amiodarone  and start mexiletine 250 mg twice daily, quinidine 200 mg twice daily.  If he continues to have episodes of VT, [name] need further conversations on goals of care. Permanent atrial fibrillation: Continue anticoagulation Confusion: Thought secondary to scopolamine .  Has improved with stopping scopolamine . Coronary artery disease: No plans for intervention.  [name] M. Tiari Andringa MD 04/15/2023 12:54 PM

## 2023-04-14 NOTE — Progress Notes (Signed)
  Echocardiogram 2D Echocardiogram has been performed.  Ocie Doyne RDCS 04/14/2023, 10:13 AM

## 2023-04-14 NOTE — Progress Notes (Addendum)
 Patient Name: [name]     Progress Note  Patient Name: [name] Date of Encounter: 04/14/2023  Primary Cardiologist: Wilbert Bihari, MD     Patient Profile     89 y.o. male with ischemic disease, prior CABG and AVR, permanent Afib with recent hospitalization for VT/Storm  for which he was started on amio cx by recurrent afib and then GI issues.  Following discharge 12/24, progressive NV with weakness prompting admission 1/4 for FTT Serendipitously ( smarter than me) was started on scopolamine  prior to admission with resolution on nausea   DATE TEST EF    5/21 Echo   30-35 %     1/25 Echo  Pending                Date Cr K Hgb  5/24 2.19 4.3 13.7  1/25 2.15 3.9<<5.5 13.1     Subjective      Confused and disoriented this am   Past Medical History:  Diagnosis Date   Arthritis    minor everywhere (04/22/2015)   Asthma    a touch   Barrett esophagus    CAD (coronary artery disease)    a. s/p 3 vvessel CABG with LIMA to LAD, SVG to diagonal 1, SVG to RCA 12/09.   Cardiac resynchronization therapy defibrillator (CRT-D) in place    Chronic anticoagulation 01/23/2013   Chronic combined systolic and diastolic CHF (congestive heart failure) (HCC)    dry weight 197-200lbs.   CKD (chronic kidney disease) stage 3, GFR 30-59 ml/min (HCC) 08/23/2014   Complication of anesthesia    DCM (dilated cardiomyopathy) (HCC)    initially concerned for TTP amyloidosis but Tc-PYP scan 06/2017 was not consistent with amyloid and SPEP/UPEP with no M spike. EF 20-25% by echo 2019   Diverticulitis 08/2019   Dyslipidemia 01/23/2013   Essential hypertension 01/23/2013   GERD (gastroesophageal reflux disease)    GI bleed 05/08/2017   H/O: rheumatic fever    Hepatitis 1957   don't know what kind:   History of hiatal hernia    History of PFTs    a. Amiodarone  started 5/16 >> PFTs w/ DLCO 6/16:  FEV1 72% predicted, FEV1/FVC 66%, DLCO 66% >> minimal reversible  obstructive airways disease with mild diffusion defect (suggestive of emphysema but absence of hyperinflation inconsistent with dx)   Hyperkalemia 08/23/2014   Hyperthyroidism 05/26/2017   Leg weakness 01/28/2017   Multiple thyroid  nodules 08/13/2017   Orthostatic hypotension    Persistent atrial fibrillation (HCC) 01/23/2013   Pleural effusion on right 05/26/2017   Pneumonia 08/2014   dr thought I may have had a touch   Pre-diabetes    Protein-calorie malnutrition, severe 05/27/2017   S/P AVR (aortic valve replacement) 2009   a. severe AS s/p AVR with pericardial tissue valve 2009.   Shingles 08/23/2014   VT (ventricular tachycardia) (HCC) 05/06/2017    Scheduled Meds:  Scheduled Meds:  apixaban   2.5 mg Oral BID   atorvastatin   40 mg Oral QPM   famotidine   20 mg Oral QPM   furosemide   20 mg Oral Daily   isosorbide  mononitrate  30 mg Oral Daily   pantoprazole   40 mg Oral QAC breakfast   sodium chloride  flush  3 mL Intravenous Q12H   Continuous Infusions:  amiodarone  30 mg/hr (04/14/23 0400)   promethazine  (PHENERGAN ) injection (IM or IVPB)     acetaminophen  **OR** acetaminophen , albuterol , ondansetron  (ZOFRAN ) IV, promethazine  (PHENERGAN ) injection (IM or  IVPB), senna-docusate    PHYSICAL EXAM Vitals:   04/13/23 2343 04/14/23 0326 04/14/23 0551 04/14/23 0809  BP: (!) 136/98 (!) 149/85  (!) 146/87  Pulse: 89 95  96  Resp: 18 (!) 21  18  Temp: 97.9 F (36.6 C) 97.8 F (36.6 C)  98.4 F (36.9 C)  TempSrc: Oral Oral  Axillary  SpO2: 98% 97%  96%  Weight:   88.3 kg   Height:       Well developed and nourished in no acute distress HENT normal Neck supple with JVP-flat Carotids brisk and full without bruits Clear Regular rate and rhythm, no murmurs or gallops Abd-soft with active BS without hepatomegaly No Clubbing cyanosis edema Skin-warm and dry turgor reasonable  A & Oriented  Grossly normal sensory and motor function   TELEMETRY: Reviewed personnally pt in Vpacing  with competing Atrial fib  Tracings from the prior hospitalization were reviewed.  I know the notes describe recurrent ventricular tachycardia, nothing was stored in the CV strips and I wonder whether it is possible that the competing in atrial fibrillation which I saw today might not have been misinterpreted as I was initially inclined to do as slow ventricular tachycardia.:  ECG personally reviewed    Intake/Output Summary (Last 24 hours) at 04/14/2023 0844 Last data filed at 04/14/2023 0408 Gross per 24 hour  Intake 970.92 ml  Output 675 ml  Net 295.92 ml    LABS: Basic Metabolic Panel: Recent Labs  Lab 04/12/23 1729 04/13/23 0238 04/14/23 0304  NA 136 136 133*  K 5.5* 3.9 3.7  CL 103 100 96*  CO2 24 22 22   GLUCOSE 116* 163* 129*  BUN 26* 24* 25*  CREATININE 2.15* 2.24* 2.27*  CALCIUM  8.2* 8.4* 8.7*  MG 2.4  --   --    Cardiac Enzymes: No results for input(s): CKTOTAL, CKMB, CKMBINDEX, TROPONINI in the last 72 hours. CBC: Recent Labs  Lab 04/12/23 1639 04/13/23 0238 04/14/23 0301  WBC 9.4 10.5 13.1*  NEUTROABS 6.4  --   --   HGB 14.7 13.1 13.2  HCT 45.7 41.2 40.9  MCV 93.5 94.5 91.1  PLT 292 282 313   PROTIME: No results for input(s): LABPROT, INR in the last 72 hours. Liver Function Tests: No results for input(s): AST, ALT, ALKPHOS, BILITOT, PROT, ALBUMIN in the last 72 hours. No results for input(s): LIPASE, AMYLASE in the last 72 hours. BNP: BNP (last 3 results) Recent Labs    04/12/23 1639  BNP 597.6*       ASSESSMENT AND PLAN: Ventricular tachycardia-recurrent   GI intolerance secondary to amiodarone /mexiletine   Ischemic cardiomyopathy-CABG and prior AVR   Renal insufficiency chronic Estimated Creatinine Clearance: 25 mL/min (A) (by C-G formula based on SCr of 2.24 mg/dL (H)).  Grade 4   ICD-CRT-D-[employer]   Atrial fibrillation-permanent (ventricular pacing 92% 9/24 need repeat)  Blood pressure elevated     Confusion Resume carvedilol  12.5 bid for both VT and elevated BP  For now we will hold off on the amiodarone  as it is unclear to me what the rhythms were in the last hospitalization for the reasons outlined above.  Perhaps device interrogation will be able to clarify and that can be done tomorrow.  Confusion is a known complication of scopolamine , its half-life is 9 hours or so and the duration of the side effects are 12-24 hours.  Hopefully will get better.  Will also need to be thinking of other causes.  Notably his white count  is up to 13.1.  Will send a urinalysis  If the confusion abates with the discontinuation of scopolamine  they would have to implicate the scopolamine  and it would not be an option.  If this is not an option then amiodarone  is probably not an option either, while he may tolerated now, he was on it 6 or 8 years ago and had to come off of it then also because of GI issues and also concerns of pulmonary issues.  Obviously, the options are limited and low-dose nausea might be preferable.  I wonder whether quinidine might not be of some value for him as it is primarily hepatically metabolized will defer to primary EP service  With with decreased p.o. intake and the nausea and vomiting antecedent to his hospitalization we will put him on low-dose saline for 24 hours   Signed, Elspeth Sage MD  04/14/2023

## 2023-04-14 NOTE — Progress Notes (Signed)
 PROGRESS NOTE    [name]  FMW:[id number] DOB: [birth year] DOA: 04/12/2023 PCP: Nanci Senior, MD   Brief Narrative:  HPI: [name] is a 89 y.o. male with medical history significant for hypertension, hyperlipidemia, CAD, atrial fibrillation on Eliquis , AAS status post bioprosthetic AVR, HFrEF, and ventricular tachycardia who presents with nausea and fatigue.   Patient was recently admitted with VT storm requiring ATP.  Sotalol  was stopped due to ineffectiveness and he was transition to amiodarone  and had mexiletine increased.  He continued to have ATP despite reloading IV amiodarone , was not felt to be a candidate for invasive EP procedures, and was eventually discharged home per patient's wishes with the understanding that he would continue to have VT.    Since the discharge, he was experiencing significant nausea that was attributed to his medications and EP recommended decreasing his amiodarone .   ED Course: Upon arrival to the ED, patient is found to be afebrile and saturating well on room air with normal heart rate and stable blood pressure.  Labs were most notable for potassium 5.5, creatinine 2.15, normal WBC, and troponin 26.   Cardiology was consulted by the ED physician and patient was given IV Lasix , Lokelma , and was started on amiodarone  infusion.  Assessment & Plan:   Principal Problem:   Ventricular tachycardia (HCC) Active Problems:   Coronary artery disease   Persistent atrial fibrillation (HCC)   Stage 4 chronic kidney disease (HCC)   Hyperkalemia   Chronic combined systolic (congestive) and diastolic (congestive) heart failure (HCC)   Ventricular tachycardia, sustained (HCC)  1. Ventricular tachycardia  - Appreciate cardiology consultation, patient remains on amiodarone  infusion.  Management per cardiology.   2. CAD  -Asymptomatic, continue beta-blocker and nitrate     3. HFrEF  -Appears dry.  Family is requesting IV fluids.  They already spoke to  cardiologist.  I realized that patient has been started on normal saline at 50 cc/h with no stop time or date.  Interestingly, patient is also on Lasix .  Will defer to cardiology for management of this.   4. Atrial fibrillation  Amiodarone  drip and Eliquis .  Management per cardiology.   5. CKD 4; hyperkalemia  -Baseline creatinine around 2.4-2.5, SCr appears close to baseline, mild hyperkalemia treated with Lokelma  in the ED and normalized now.   Hyperlipidemia: Continue atorvastatin .  Delirium: Patient was confused this morning, daughter was concerned about the scopolamine .  This has been discontinued as a scopolamine  can very well because delirium.  We will see how he does.  I will place him on Seroquel  tonight.  Delirium precautions 1. Avoid benzodiazepines, antihistamines, anticholinergics, and minimize opiate use as these may worsen delirium. 2: Assess, prevent and manage pain as lack of treatment can result in delirium.  3: Provide appropriate lighting and clear signage; a clock and calendar should be easily visible to the patient. 4: Monitor environmental factors. Reduce light and noise at night (close shades, turn off lights, turn off TV, ect). Correct any alterations in sleep cycle. 5: Reorient the patient to person, place, time and situation on each encounter.  6: Correct sensory deficits if possible (replace eye glasses, hearing aids, ect). 7: Avoid restraints if able. Severely delirious patients benefit from constant observation by a sitter.  DVT prophylaxis: apixaban  (ELIQUIS ) tablet 2.5 mg Start: 04/13/23 0200   Code Status: Full Code  Family Communication: dtr present at bedside.  Plan of care discussed with patient in length and he/she verbalized understanding and agreed with it.  Status is: Inpatient Remains inpatient appropriate because: On amiodarone  infusion.   Estimated body mass index is 26.4 kg/m as calculated from the following:   Height as of this encounter: 6'  (1.829 m).   Weight as of this encounter: 88.3 kg.    Nutritional Assessment: Body mass index is 26.4 kg/m.SABRA Seen by dietician.  I agree with the assessment and plan as outlined below: Nutrition Status:        . Skin Assessment: I have examined the patient's skin and I agree with the wound assessment as performed by the wound care RN as outlined below:    Consultants:  Cardiology  Procedures:  None  Antimicrobials:  Anti-infectives (From admission, onward)    None         Subjective: Seen and examined.  Patient was getting echo.  Daughter at the bedside.  Patient was alert and oriented to self only.  Confused compared to yesterday.  Denied any complaints.  Appears dry.  Objective: Vitals:   04/14/23 0326 04/14/23 0551 04/14/23 0809 04/14/23 1116  BP: (!) 149/85  (!) 146/87 126/80  Pulse: 95  96 83  Resp: (!) 21  18 19   Temp: 97.8 F (36.6 C)  98.4 F (36.9 C) 97.8 F (36.6 C)  TempSrc: Oral  Axillary Axillary  SpO2: 97%  96% 97%  Weight:  88.3 kg    Height:        Intake/Output Summary (Last 24 hours) at 04/14/2023 1128 Last data filed at 04/14/2023 0800 Gross per 24 hour  Intake 820.92 ml  Output 825 ml  Net -4.08 ml   Filed Weights   04/12/23 1602 04/14/23 0551  Weight: 85.3 kg 88.3 kg    Examination:  General exam: Appears calm and comfortable, appears dry and dehydrated Respiratory system: Clear to auscultation. Respiratory effort normal. Cardiovascular system: S1 & S2 heard, RRR. No JVD, murmurs, rubs, gallops or clicks. No pedal edema. Gastrointestinal system: Abdomen is nondistended, soft and nontender. No organomegaly or masses felt. Normal bowel sounds heard. Central nervous system: Alert and oriented x 1. No focal neurological deficits. Extremities: Symmetric 5 x 5 power. Skin: No rashes, lesions or ulcers  Data Reviewed: I have personally reviewed following labs and imaging studies  CBC: Recent Labs  Lab 04/12/23 1639  04/13/23 0238 04/14/23 0301  WBC 9.4 10.5 13.1*  NEUTROABS 6.4  --   --   HGB 14.7 13.1 13.2  HCT 45.7 41.2 40.9  MCV 93.5 94.5 91.1  PLT 292 282 313   Basic Metabolic Panel: Recent Labs  Lab 04/12/23 1729 04/13/23 0238 04/14/23 0304  NA 136 136 133*  K 5.5* 3.9 3.7  CL 103 100 96*  CO2 24 22 22   GLUCOSE 116* 163* 129*  BUN 26* 24* 25*  CREATININE 2.15* 2.24* 2.27*  CALCIUM  8.2* 8.4* 8.7*  MG 2.4  --   --    GFR: Estimated Creatinine Clearance: 24.7 mL/min (A) (by C-G formula based on SCr of 2.27 mg/dL (H)). Liver Function Tests: No results for input(s): AST, ALT, ALKPHOS, BILITOT, PROT, ALBUMIN in the last 168 hours. No results for input(s): LIPASE, AMYLASE in the last 168 hours. No results for input(s): AMMONIA in the last 168 hours. Coagulation Profile: No results for input(s): INR, PROTIME in the last 168 hours. Cardiac Enzymes: No results for input(s): CKTOTAL, CKMB, CKMBINDEX, TROPONINI in the last 168 hours. BNP (last 3 results) No results for input(s): PROBNP in the last 8760 hours. HbA1C: No results for input(s):  HGBA1C in the last 72 hours. CBG: Recent Labs  Lab 04/12/23 1659  GLUCAP 123*   Lipid Profile: No results for input(s): CHOL, HDL, LDLCALC, TRIG, CHOLHDL, LDLDIRECT in the last 72 hours. Thyroid  Function Tests: No results for input(s): TSH, T4TOTAL, FREET4, T3FREE, THYROIDAB in the last 72 hours. Anemia Panel: No results for input(s): VITAMINB12, FOLATE, FERRITIN, TIBC, IRON, RETICCTPCT in the last 72 hours. Sepsis Labs: No results for input(s): PROCALCITON, LATICACIDVEN in the last 168 hours.  No results found for this or any previous visit (from the past 240 hours).   Radiology Studies: DG Chest Port 1 View Result Date: 04/12/2023 CLINICAL DATA:  Shortness of breath EXAM: PORTABLE CHEST 1 VIEW COMPARISON:  03/19/2023 FINDINGS: Left chest wall CRT D. Sternotomy and  CABG. Stable cardiomediastinal silhouette. Aortic atherosclerotic calcification. No focal consolidation, pleural effusion, or pneumothorax. Scarring in the right mid lung. No displaced rib fractures. IMPRESSION: No active disease. Electronically Signed   By: Norman Gatlin M.D.   On: 04/12/2023 19:33    Scheduled Meds:  apixaban   2.5 mg Oral BID   atorvastatin   40 mg Oral QPM   famotidine   20 mg Oral QPM   furosemide   20 mg Oral Daily   isosorbide  mononitrate  30 mg Oral Daily   pantoprazole   40 mg Oral QAC breakfast   sodium chloride  flush  3 mL Intravenous Q12H   Continuous Infusions:  sodium chloride  50 mL/hr at 04/14/23 1023   amiodarone  30 mg/hr (04/14/23 0400)   promethazine  (PHENERGAN ) injection (IM or IVPB)       LOS: 2 days   Fredia Skeeter, MD [facility]  04/14/2023, 11:28 AM   *Please note that this is a verbal dictation therefore any spelling or grammatical errors are due to the Dragon Medical One system interpretation.  Please page via Amion and do not message via secure chat for urgent patient care matters. Secure chat can be used for non urgent patient care matters.  How to contact the [facility] Attending or Consulting provider 7A - 7P or covering provider during after hours 7P -7A, for this patient?  Check the care team in [facility] and look for a) attending/consulting [facility] provider listed and b) the [facility] team listed. Page or secure chat 7A-7P. Log into [email or web address] and use [facility]'s universal password to access. If you do not have the password, please contact the hospital operator. Locate the [facility] provider you are looking for under [facility] and page to a number that you can be directly reached. If you still have difficulty reaching the provider, please page the [facility] (Director on Call) for the Hospitalists listed on amion for assistance.

## 2023-04-15 ENCOUNTER — Ambulatory Visit: Payer: Medicare HMO | Admitting: Student

## 2023-04-15 DIAGNOSIS — I472 Ventricular tachycardia, unspecified: Secondary | ICD-10-CM | POA: Diagnosis not present

## 2023-04-15 DIAGNOSIS — I255 Ischemic cardiomyopathy: Secondary | ICD-10-CM

## 2023-04-15 DIAGNOSIS — Z95810 Presence of automatic (implantable) cardiac defibrillator: Secondary | ICD-10-CM

## 2023-04-15 DIAGNOSIS — Z7189 Other specified counseling: Secondary | ICD-10-CM

## 2023-04-15 DIAGNOSIS — Z515 Encounter for palliative care: Secondary | ICD-10-CM | POA: Diagnosis not present

## 2023-04-15 DIAGNOSIS — I5022 Chronic systolic (congestive) heart failure: Secondary | ICD-10-CM

## 2023-04-15 DIAGNOSIS — I4819 Other persistent atrial fibrillation: Secondary | ICD-10-CM

## 2023-04-15 LAB — BASIC METABOLIC PANEL
Anion gap: 11 (ref 5–15)
BUN: 23 mg/dL (ref 8–23)
CO2: 21 mmol/L — ABNORMAL LOW (ref 22–32)
Calcium: 8.3 mg/dL — ABNORMAL LOW (ref 8.9–10.3)
Chloride: 101 mmol/L (ref 98–111)
Creatinine, Ser: 1.94 mg/dL — ABNORMAL HIGH (ref 0.61–1.24)
GFR, Estimated: 33 mL/min — ABNORMAL LOW (ref >60)
Glucose, Bld: 109 mg/dL — ABNORMAL HIGH (ref 70–99)
Potassium: 3.9 mmol/L (ref 3.5–5.1)
Sodium: 133 mmol/L — ABNORMAL LOW (ref 135–145)

## 2023-04-15 LAB — CBC
HCT: 37 % — ABNORMAL LOW (ref 39.0–52.0)
Hemoglobin: 12.5 g/dL — ABNORMAL LOW (ref 13.0–17.0)
MCH: 30.3 pg (ref 26.0–34.0)
MCHC: 33.8 g/dL (ref 30.0–36.0)
MCV: 89.6 fL (ref 80.0–100.0)
Platelets: 277 10*3/uL (ref 150–400)
RBC: 4.13 MIL/uL — ABNORMAL LOW (ref 4.22–5.81)
RDW: 14.8 % (ref 11.5–15.5)
WBC: 9.7 10*3/uL (ref 4.0–10.5)
nRBC: 0 % (ref 0.0–0.2)

## 2023-04-15 LAB — AMIODARONE LEVEL
Amiodarone Lvl: 952 ng/mL — ABNORMAL LOW (ref 1000–2500)
N-Desethyl-Amiodarone: 528 ng/mL

## 2023-04-15 MED ORDER — QUETIAPINE FUMARATE 25 MG PO TABS
25.0000 mg | ORAL_TABLET | ORAL | Status: DC
  Administered 2023-04-15 – 2023-04-16 (×2): 25 mg via ORAL
  Filled 2023-04-15 (×2): qty 1

## 2023-04-15 MED ORDER — MEXILETINE HCL 250 MG PO CAPS
250.0000 mg | ORAL_CAPSULE | Freq: Two times a day (BID) | ORAL | Status: DC
  Administered 2023-04-15 – 2023-04-22 (×15): 250 mg via ORAL
  Filled 2023-04-15 (×17): qty 1

## 2023-04-15 MED ORDER — POLYETHYLENE GLYCOL 3350 17 G PO PACK
17.0000 g | PACK | Freq: Every day | ORAL | Status: DC
  Administered 2023-04-15: 17 g via ORAL
  Filled 2023-04-15 (×6): qty 1

## 2023-04-15 MED ORDER — QUINIDINE SULFATE 200 MG PO TABS
200.0000 mg | ORAL_TABLET | Freq: Two times a day (BID) | ORAL | Status: DC
  Administered 2023-04-15 (×2): 200 mg via ORAL
  Filled 2023-04-15 (×3): qty 1

## 2023-04-15 MED ORDER — FUROSEMIDE 10 MG/ML IJ SOLN
40.0000 mg | Freq: Every day | INTRAMUSCULAR | Status: DC
  Administered 2023-04-15 – 2023-04-22 (×8): 40 mg via INTRAVENOUS
  Filled 2023-04-15 (×8): qty 4

## 2023-04-15 NOTE — Plan of Care (Signed)

## 2023-04-15 NOTE — Progress Notes (Addendum)
 Patient Name: [name] Date of Encounter: 04/16/2023  Primary Cardiologist: Wilbert Bihari, MD Electrophysiologist: Soyla Gladis Norton, MD  Interval Summary   Pt's son reports the pt has been confused overnight, had shaking yesterday, not eating well. Notes pt's breathing is improved today  Vital Signs    Vitals:   04/16/23 0136 04/16/23 0345 04/16/23 0539 04/16/23 0758  BP: (!) 95/52 (!) 92/47  118/63  Pulse: 85 88 77 77  Resp: 14 16 16 15   Temp:  97.7 F (36.5 C)  (!) 96.9 F (36.1 C)  TempSrc:  Oral  Axillary  SpO2: 97% 93% 97% 92%  Weight:  87.5 kg    Height:        Intake/Output Summary (Last 24 hours) at 04/16/2023 0922 Last data filed at 04/15/2023 2000 Gross per 24 hour  Intake 250 ml  Output 1750 ml  Net -1500 ml   Filed Weights   04/14/23 0551 04/15/23 0523 04/16/23 0345  Weight: 88.3 kg 89.3 kg 87.5 kg    Physical Exam    GEN- The patient is well appearing, alert and oriented x 3 today.   Lungs- Clear to ausculation bilaterally, normal work of breathing Cardiac- Regular rate and rhythm with frequent PVC's, NSVT, no murmurs, rubs or gallops GI- soft, NT, ND, + BS Extremities- no clubbing or cyanosis. No edema  Telemetry    VP 80-90's, frequent NSVT in 90's with different morphologies (personally reviewed)  Hospital Course    [name] is a 89 y.o. male with PMH of permanent AF, sCHF s/p ICD, VT, CAD s/p CABG with AVR admitted 04/13/23 for VT storm.  Recent prolonged hospitalization for VT storm and he was restarted on amiodarone  > slowed VT but continued to have recurrent bouts treated with ATP. His VT slowed to the 120's and VT zones were adjusted.  He was discharged on PO amio but outpatient course was complicated by nausea/vomiting and progressive weakness.   Assessment & Plan    VT Storm  ICM s/p CABG & AVR (remote) -no further amiodarone   -mexiletine 250 mg BID  -increase quinidine to 200 mg TID  -tele monitoring  -appreciate  Palliative Care assistance with patient care > DNR after discussion. Family to decide on turning off ICD therapies and leaving on ATP (painless therapy) -f/u with EP at discharge    Nausea / Vomiting  Confusion with scopolamine  > stopped 1/5 -PRN phenergan  -zofran  sparingly if needed   Permanent AF  -OAC for stroke prophylaxis    Secondary Hypercoagulable State  -Eliquis  2.5 mg BID    Confusion  Thought secondary to scopolamine , improving.  -likely some element of residual delirium / deconditioning   For questions or updates, please contact [facility] Please consult [email or web address] for contact info under Cardiology/STEMI.  Signed, Daphne Barrack, MSN, APRN, NP-C, AGACNP-BC [facility] - Electrophysiology  04/16/2023, 9:22 AM  I have seen and examined this patient with Daphne Barrack.  Agree with above, note added to reflect my findings.  Patient laying in bed.  Mildly confused per his son.  Otherwise no acute complaints.  GEN: Well nourished, well developed, in no acute distress  HEENT: normal  Neck: no JVD, carotid bruits, or masses Cardiac: Irregular; no murmurs, rubs, or gallops,no edema  Respiratory:  clear to auscultation bilaterally, normal work of breathing GI: soft, nontender, nondistended, + BS MS: no deformity or atrophy  Skin: warm and dry, device site well healed Neuro:  Strength and sensation are intact Psych: euthymic  mood, full affect   VT storm: Currently on mexiletine and quinidine.  Continuing to have episodes of VT, though slow.  Not receiving ICD therapies as he is having multiple episodes of nonsustained VT, all with heart rates around 100 bpm.  Continue current therapy. Permanent atrial fibrillation: Continue anticoagulation  chronic systolic heart failure: Received IV Lasix  yesterday.  Volume status stable. Coronary artery disease: No indication for left heart catheterization.  No chest pain. AVR: Stable on most recent echo Confusion: Likely  due to delirium/deconditioning.  A long discussion with the patient's son.  Patient is decided to be DNR status.  Also discussed turning off ICD therapy versus turning off defibrillation and leaving on ATP therapy.  Patient's son [name] discuss this further with family.  Ongoing discussions of CODE STATUS and goals of care.  [name] M. Payal Stanforth MD 04/16/2023 11:18 AM

## 2023-04-15 NOTE — Progress Notes (Signed)
    04/15/23 2338  Provider Notification  Provider Name/Title Dr. Franky  Date Provider Notified 04/15/23  Time Provider Notified 2338  Method of Notification Page and internal secure chat  Notification Reason Low BP 80/50 mmHg, Pt is asymptomatic, He is  sleeping, no complaints, response to commands.  Provider response Evaluate remotely;See new orders (Lactic acid stat)  Date of Provider Response 04/15/23  Time of Provider Response 2344   Wendi Dash, RN

## 2023-04-15 NOTE — Consult Note (Addendum)
 Palliative Medicine Inpatient Consult Note  Consulting Provider:  Vernon Ranks, MD   Reason for consult:   Palliative Care Consult Services Palliative Medicine Consult  Reason for Consult? GOC   04/15/2023  HPI:  Per intake H&P --> [name] is a 89 y.o. male with medical history significant for hypertension, hyperlipidemia, CAD, atrial fibrillation on Eliquis , AAS status post bioprosthetic AVR, HFrEF, and ventricular tachycardia who presented with nausea and fatigue. Palliative care saw [name] on his last admission.   Clinical Assessment/Goals of Care:  *Please note that this is a verbal dictation therefore any spelling or grammatical errors are due to the Dragon Medical One system interpretation.  I have reviewed medical records including EPIC notes, labs and imaging, received report from bedside RN, assessed the patient who is lying in bed somewhat short of breath.    I met with [name], his daughter, [name], and son, [name] to further discuss diagnosis prognosis, GOC, EOL wishes, disposition and options.   I introduced Palliative Medicine as specialized medical care for people living with serious illness. It focuses on providing relief from the symptoms and stress of a serious illness. The goal is to improve quality of life for both the patient and the family.  Medical History Review and Understanding:  A review of [name] past medical history was complete inclusive of arthritis, GERD, Afib, HTN, HLD, CAD, HFrEF s/p CRT-D, Vtach, CKD,and aortic valve replacement.   Social History:  Patient has been married for 60 years to his wife. He has a daughter [name] and 2 sons, [name] and [name]. His family all lives locally. He is very close to his family. 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 He likes to fix equipment, work on his tractor, and gardening. He previously had a big garden.   Functional and  Nutritional State:  [name] was fairly well functioning on discharge. He lives with his wife who mobilizes with a cane. He is able to support [name] and his children take turns checking in on him. Has a fair appetite.   Advance Directives:  A detailed discussion was had today regarding advanced directives.  Patients decision makers are his three children.   Code Status:  Concepts specific to code status, artifical feeding and hydration, continued IV antibiotics and rehospitalization was had.  The difference between a aggressive medical intervention path  and a palliative comfort care path for this patient at this time was had.   Encouraged patient/family to consider [name] status understanding evidenced based poor outcomes in similar hospitalized patient, as the cause of arrest is likely associated with advanced chronic/terminal illness rather than an easily reversible acute cardio-pulmonary event. I explained that [name] does not change the medical plan and it only comes into effect after a person has arrested (died).  It is a protective measure to keep us  from harming the patient in their last moments of life.   [name] does not think he would want to live like this. He is considering DNAR/DNI and possibly AICD deactivation though wants to know more from the EP team.   Discussion:  Reviewed that [name] was recently hospitalized in the setting of VT storm. He was then transitioned to amiodarone  with increased mexiletine. He was not identified as a person who could endure additional invasive procedures. Per prior conversations it was identified that these episodes may very well continue.  Per patients family he was functioning fairly well until roughly three  days ago when he had more pronounced weakness and nausea. This is what led to hospitalization. Since being here the EP team has seen and modified [name] medications.   We reviewed that all measures which can be tried at this time are being  tried though it remains uncertain if the present medication regiment will work. Discussed if not effective then hospice may need to be considered. I described hospice as a service for patients who have a life expectancy of 6 months or less. The goal of hospice is the preservation of dignity and quality at the end phases of life. Under hospice care, the focus changes from curative to symptom relief.   At this time patient and his family are hopeful he can be evaluated by PT/OT to see what additional services he will need moving forward. Family also mentions the need for increased nursing care when he discharges.   Patient and his family remain hopeful for improvements over the next day or two. We discussed having a detailed in person meeting with all family members and the EP team. Patients daughter, [name] plans to speak to her siblings and identify a date and time that may work best.   Discussed the importance of continued conversation with family and their  medical providers regarding overall plan of care and treatment options, ensuring decisions are within the context of the patients values and GOCs.  Decision Maker: [name], [name] (Daughter): [phone] (Mobile)   SUMMARY OF RECOMMENDATIONS   Full Code --> spoke to patient about the possible burden of CPR, shocks, and intubation. He shares he had not gotten to speak to his family about this though at this point does not feel he would want to live this way. He also endorses the desire to speak to the EP team regarding what de-activation of his AICD would involve.  Patients daughter, [name] will speak to her siblings regarding a date and time that may work for them as a family to come together to further address goals of care in the company of the Electrophysiology team as well as Palliative Care  Presently allowing time for outcomes  PT/OT are orders for generalized weakness  Appreciate TOC - Family asking if there are additional  resources for a nurse to come into the home  Appreciate [facility] investigating why patient was not see by the Palliative arm on discharge  Ongoing PMT support  Code Status/Advance Care Planning: FULL CODE  Palliative Prophylaxis:  Aspiration, Bowel Regimen, Delirium Protocol, Frequent Pain Assessment, Oral Care, Palliative Wound Care, and Turn Reposition  Additional Recommendations (Limitations, Scope, Preferences): Continue current measures   Psycho-social/Spiritual:  Desire for further Chaplaincy support: Patient is a Christian  Additional Recommendations: Education VT.Afib, rate control   Prognosis: Limited overall.   Discharge Planning: To be determined.  Vitals:   04/15/23 1146 04/15/23 1400  BP: 110/73   Pulse: 89 79  Resp: 19 20  Temp: 97.7 F (36.5 C)   SpO2: 94% 98%    Intake/Output Summary (Last 24 hours) at 04/15/2023 1447 Last data filed at 04/15/2023 1146 Gross per 24 hour  Intake 900.87 ml  Output 1550 ml  Net -649.13 ml   Last Weight  Most recent update: 04/15/2023  5:23 AM    Weight  89.3 kg (196 lb 13.9 oz)            Gen:  Elderly Caucasian M in NAD HEENT: moist mucous membranes CV: Regular rate and irregular rhythm  PULM:  On RA,  appears short of breath ABD: Not distended EXT: No edema  Neuro: Alert and oriented x3 - hard of hearing  PPS: 40%   This conversation/these recommendations were discussed with patient primary care team, Dr. Vernon and [facility]  Billing based on MDM: High  Problems Addressed: One acute or chronic illness or injury that poses a threat to life or bodily function  Amount and/or Complexity of Data: Category 3:Discussion of management or test interpretation with external physician/other qualified health care professional/appropriate source (not separately reported)  Risks: Drug therapy requiring intensive monitoring for toxicity, Decision regarding hospitalization or escalation of hospital care, and  Decision not to resuscitate or to de-escalate care because of poor prognosis ______________________________________________________ [name] [facility] Palliative Medicine Team Team Cell Phone: [phone] Please utilize secure chat with additional questions, if there is no response within 30 minutes please call the above phone number  Palliative Medicine Team providers are available by phone from 7am to 7pm daily and can be reached through the team cell phone.  Should this patient require assistance outside of these hours, please call the patient's attending physician.

## 2023-04-15 NOTE — Progress Notes (Signed)
 PROGRESS NOTE    [name]  FMW:[id number] DOB: 1932/04/14 DOA: 04/12/2023 PCP: Nanci Senior, MD   Brief Narrative:  [name] is a 89 y.o. male with medical history significant for hypertension, hyperlipidemia, CAD, atrial fibrillation on Eliquis , AAS status post bioprosthetic AVR, HFrEF, and ventricular tachycardia who presented with nausea and fatigue.   Patient was recently admitted with VT storm requiring ATP.  Sotalol  was stopped due to ineffectiveness and he was transition to amiodarone  and had mexiletine increased.  He continued to have ATP despite reloading IV amiodarone , was not felt to be a candidate for invasive EP procedures, and was eventually discharged home per patient's wishes with the understanding that he would continue to have VT.    Since the discharge, he was experiencing significant nausea that was attributed to his medications and EP recommended decreasing his amiodarone .   Upon arrival to the ED, patient is found to be afebrile and saturating well on room air with normal heart rate and stable blood pressure.  Labs were most notable for potassium 5.5, creatinine 2.15, normal WBC, and troponin 26.   Patient admitted under hospital service, cardiology consulted.  Assessment & Plan:   Principal Problem:   Ventricular tachycardia (HCC) Active Problems:   Coronary artery disease   Persistent atrial fibrillation (HCC)   Stage 4 chronic kidney disease (HCC)   Hyperkalemia   Chronic combined systolic (congestive) and diastolic (congestive) heart failure (HCC)   Ventricular tachycardia, sustained (HCC)  1. Ventricular tachycardia  - Appreciate cardiology consultation, patient was initially on amiodarone  drip, this has been stopped today, he has been transitioned to mexiletine and has been started on quinidine.  Per EP, if he continues to have VT with this regimen, then likely he will not have any other options and focus on goal of care will be recommended.   Appreciate cardiology help.  Management per them.   2. CAD  -Asymptomatic, continue nitrate, he is not on any beta-blockers.   3. HFrEF  Appear dry yesterday, received some IV fluids yesterday.  Appears euvolemic to me and no nausea and tolerating diet so I will discontinue fluids in order to prevent any volume overload.   4. Atrial fibrillation  Continue Eliquis .  Management per cardiology.   5. CKD 4; hyperkalemia  -Baseline creatinine around 2.4-2.5, SCr, much better now.  Mild hyperkalemia treated with Lokelma  in the ED and normalized now.   Hyperlipidemia: Continue atorvastatin .  Delirium: Patient was confused yesterday morning and reportedly earlier today 2.  I saw him around 1030, patient was fully alert and oriented.  Confusion was likely secondary to scopolamine  patch which was discontinued yesterday.  Will start on Seroquel  tonight. 1. Avoid benzodiazepines, antihistamines, anticholinergics, and minimize opiate use as these may worsen delirium. 2: Assess, prevent and manage pain as lack of treatment can result in delirium.  3: Provide appropriate lighting and clear signage; a clock and calendar should be easily visible to the patient. 4: Monitor environmental factors. Reduce light and noise at night (close shades, turn off lights, turn off TV, ect). Correct any alterations in sleep cycle. 5: Reorient the patient to person, place, time and situation on each encounter.  6: Correct sensory deficits if possible (replace eye glasses, hearing aids, ect). 7: Avoid restraints if able. Severely delirious patients benefit from constant observation by a sitter.  DVT prophylaxis: apixaban  (ELIQUIS ) tablet 2.5 mg Start: 04/13/23 0200   Code Status: Full Code  Family Communication: dtr present at bedside.  Plan  of care discussed with patient in length and he/she verbalized understanding and agreed with it.  Status is: Inpatient Remains inpatient appropriate because: Still having VT, needs  further monitoring as inpatient.   Estimated body mass index is 26.7 kg/m as calculated from the following:   Height as of this encounter: 6' (1.829 m).   Weight as of this encounter: 89.3 kg.    Nutritional Assessment: Body mass index is 26.7 kg/m.SABRA Seen by dietician.  I agree with the assessment and plan as outlined below: Nutrition Status:        . Skin Assessment: I have examined the patient's skin and I agree with the wound assessment as performed by the wound care RN as outlined below:    Consultants:  Cardiology  Procedures:  None  Antimicrobials:  Anti-infectives (From admission, onward)    None         Subjective: Patient seen and examined.  He complains of intermittent shortness of breath.  Fully alert and oriented during my visit.  No other complaint.  Objective: Vitals:   04/15/23 0310 04/15/23 0523 04/15/23 0812 04/15/23 1146  BP: 135/77  132/85 110/73  Pulse: 94  96 89  Resp: 13  19 19   Temp: 97.8 F (36.6 C)  97.7 F (36.5 C) 97.7 F (36.5 C)  TempSrc: Oral  Oral Oral  SpO2: 94%  98% 94%  Weight:  89.3 kg    Height:        Intake/Output Summary (Last 24 hours) at 04/15/2023 1309 Last data filed at 04/15/2023 1146 Gross per 24 hour  Intake 1380.87 ml  Output 1550 ml  Net -169.13 ml   Filed Weights   04/12/23 1602 04/14/23 0551 04/15/23 0523  Weight: 85.3 kg 88.3 kg 89.3 kg    Examination:  General exam: Appears calm and comfortable  Respiratory system: Clear to auscultation. Respiratory effort normal. Cardiovascular system: S1 & S2 heard, RRR. No JVD, murmurs, rubs, gallops or clicks. No pedal edema. Gastrointestinal system: Abdomen is nondistended, soft and nontender. No organomegaly or masses felt. Normal bowel sounds heard. Central nervous system: Alert and oriented. No focal neurological deficits. Extremities: Symmetric 5 x 5 power. Skin: No rashes, lesions or ulcers.  Psychiatry: Judgement and insight appear normal. Mood &  affect appropriate.   Data Reviewed: I have personally reviewed following labs and imaging studies  CBC: Recent Labs  Lab 04/12/23 1639 04/13/23 0238 04/14/23 0301 04/15/23 0232  WBC 9.4 10.5 13.1* 9.7  NEUTROABS 6.4  --   --   --   HGB 14.7 13.1 13.2 12.5*  HCT 45.7 41.2 40.9 37.0*  MCV 93.5 94.5 91.1 89.6  PLT 292 282 313 277   Basic Metabolic Panel: Recent Labs  Lab 04/12/23 1729 04/13/23 0238 04/14/23 0304 04/15/23 0232  NA 136 136 133* 133*  K 5.5* 3.9 3.7 3.9  CL 103 100 96* 101  CO2 24 22 22  21*  GLUCOSE 116* 163* 129* 109*  BUN 26* 24* 25* 23  CREATININE 2.15* 2.24* 2.27* 1.94*  CALCIUM  8.2* 8.4* 8.7* 8.3*  MG 2.4  --   --   --    GFR: Estimated Creatinine Clearance: 28.9 mL/min (A) (by C-G formula based on SCr of 1.94 mg/dL (H)). Liver Function Tests: No results for input(s): AST, ALT, ALKPHOS, BILITOT, PROT, ALBUMIN in the last 168 hours. No results for input(s): LIPASE, AMYLASE in the last 168 hours. No results for input(s): AMMONIA in the last 168 hours. Coagulation Profile: No results for  input(s): INR, PROTIME in the last 168 hours. Cardiac Enzymes: No results for input(s): CKTOTAL, CKMB, CKMBINDEX, TROPONINI in the last 168 hours. BNP (last 3 results) No results for input(s): PROBNP in the last 8760 hours. HbA1C: No results for input(s): HGBA1C in the last 72 hours. CBG: Recent Labs  Lab 04/12/23 1659  GLUCAP 123*   Lipid Profile: No results for input(s): CHOL, HDL, LDLCALC, TRIG, CHOLHDL, LDLDIRECT in the last 72 hours. Thyroid  Function Tests: No results for input(s): TSH, T4TOTAL, FREET4, T3FREE, THYROIDAB in the last 72 hours. Anemia Panel: No results for input(s): VITAMINB12, FOLATE, FERRITIN, TIBC, IRON, RETICCTPCT in the last 72 hours. Sepsis Labs: No results for input(s): PROCALCITON, LATICACIDVEN in the last 168 hours.  No results found for this or any  previous visit (from the past 240 hours).   Radiology Studies: ECHOCARDIOGRAM COMPLETE Result Date: 04/14/2023    ECHOCARDIOGRAM REPORT   Patient Name:   [name] [facility] Date of Exam: 04/14/2023 Medical Rec #:  [id number]       Height:       72.0 in Accession #:    [id number]      Weight:       194.7 lb Date of Birth:  03-Dec-1932       BSA:          2.106 m Patient Age:    89 years        BP:           146/87 mmHg Patient Gender: M               HR:           76 bpm. Exam Location:  Inpatient Procedure: 2D Echo, Cardiac Doppler and Color Doppler Indications:    Ventricular tachycardia  History:        Patient has no prior history of Echocardiogram examinations and                 Patient has prior history of Echocardiogram examinations, most                 recent 09/07/2019. CHF, CAD, Pacemaker and Prior CABG,                 Arrythmias:Atrial Fibrillation; Risk Factors:Hypertension and                 Dyslipidemia. CKD.  Sonographer:    Juanita Shaw Referring Phys: [id number] STEVEN C KLEIN IMPRESSIONS  1. Left ventricular ejection fraction, by estimation, is 20 to 25%. The left ventricle has severely decreased function. The left ventricle demonstrates global hypokinesis. Left ventricular diastolic parameters are indeterminate.  2. Right ventricular systolic function is normal. The right ventricular size is normal.  3. Left atrial size was severely dilated.  4. The mitral valve is normal in structure. No evidence of mitral valve regurgitation. No evidence of mitral stenosis. Moderate mitral annular calcification.  5. 23 magna ease pericardial valve is in the aortic valve position. . The aortic valve has been repaired/replaced. Aortic valve regurgitation is not visualized. No aortic stenosis is present. Aortic valve mean gradient measures 4.0 mmHg.  6. The inferior vena cava is normal in size with greater than 50% respiratory variability, suggesting right atrial pressure of 3 mmHg. FINDINGS  Left Ventricle: Left  ventricular ejection fraction, by estimation, is 20 to 25%. The left ventricle has severely decreased function. The left ventricle demonstrates global hypokinesis. The left ventricular internal cavity size was normal in size.  There is no left ventricular hypertrophy. Left ventricular diastolic parameters are indeterminate. Right Ventricle: The right ventricular size is normal. Right vetricular wall thickness was not well visualized. Right ventricular systolic function is normal. Left Atrium: Left atrial size was severely dilated. Right Atrium: Right atrial size was not well visualized. Pericardium: There is no evidence of pericardial effusion. Mitral Valve: The mitral valve is normal in structure. There is mild thickening of the mitral valve leaflet(s). There is mild calcification of the mitral valve leaflet(s). Moderate mitral annular calcification. No evidence of mitral valve regurgitation. No evidence of mitral valve stenosis. MV peak gradient, 6.9 mmHg. The mean mitral valve gradient is 2.0 mmHg. Tricuspid Valve: The tricuspid valve is normal in structure. Tricuspid valve regurgitation is not demonstrated. No evidence of tricuspid stenosis. Aortic Valve: 23 magna ease pericardial valve is in the aortic valve position. The aortic valve has been repaired/replaced. Aortic valve regurgitation is not visualized. No aortic stenosis is present. Aortic valve mean gradient measures 4.0 mmHg. Aortic valve peak gradient measures 6.2 mmHg. Aortic valve area, by VTI measures 1.82 cm. Pulmonic Valve: The pulmonic valve was not well visualized. Pulmonic valve regurgitation is not visualized. No evidence of pulmonic stenosis. Aorta: The aortic root and ascending aorta are structurally normal, with no evidence of dilitation. Venous: The inferior vena cava is normal in size with greater than 50% respiratory variability, suggesting right atrial pressure of 3 mmHg. IAS/Shunts: The interatrial septum was not well visualized.  Additional Comments: A device lead is visualized in the right atrium and right ventricle.  LEFT VENTRICLE PLAX 2D LVIDd:         5.70 cm      Diastology LVIDs:         4.70 cm      LV e' medial:   6.42 cm/s LV PW:         0.90 cm      LV E/e' medial: 21.5 LV IVS:        0.70 cm LVOT diam:     2.20 cm LV SV:         37 LV SV Index:   17 LVOT Area:     3.80 cm  LV Volumes (MOD) LV vol d, MOD A2C: 171.0 ml LV vol d, MOD A4C: 184.0 ml LV vol s, MOD A2C: 101.0 ml LV vol s, MOD A4C: 119.0 ml LV SV MOD A2C:     70.0 ml LV SV MOD A4C:     184.0 ml LV SV MOD BP:      67.9 ml RIGHT VENTRICLE            IVC RV Basal diam:  3.70 cm    IVC diam: 1.40 cm RV Mid diam:    2.50 cm RV S prime:     6.57 cm/s TAPSE (M-mode): 1.3 cm LEFT ATRIUM              Index        RIGHT ATRIUM           Index LA diam:        4.60 cm  2.18 cm/m   RA Area:     21.20 cm LA Vol (A2C):   135.0 ml 64.10 ml/m  RA Volume:   63.50 ml  30.15 ml/m LA Vol (A4C):   110.0 ml 52.23 ml/m LA Biplane Vol: 122.0 ml 57.92 ml/m  AORTIC VALVE  PULMONIC VALVE AV Area (Vmax):    1.74 cm     PV Vmax:       0.49 m/s AV Area (Vmean):   1.63 cm     PV Peak grad:  1.0 mmHg AV Area (VTI):     1.82 cm AV Vmax:           124.00 cm/s AV Vmean:          87.500 cm/s AV VTI:            0.202 m AV Peak Grad:      6.2 mmHg AV Mean Grad:      4.0 mmHg LVOT Vmax:         56.60 cm/s LVOT Vmean:        37.600 cm/s LVOT VTI:          0.097 m LVOT/AV VTI ratio: 0.48  AORTA Ao Root diam: 3.30 cm Ao Asc diam:  2.50 cm MITRAL VALVE MV Area (PHT): 6.12 cm     SHUNTS MV Area VTI:   1.82 cm     Systemic VTI:  0.10 m MV Peak grad:  6.9 mmHg     Systemic Diam: 2.20 cm MV Mean grad:  2.0 mmHg MV Vmax:       1.31 m/s MV Vmean:      60.7 cm/s MV Decel Time: 124 msec MV E velocity: 138.00 cm/s Dorn Ross MD Electronically signed by Dorn Ross MD Signature Date/Time: 04/14/2023/1:22:10 PM    Final     Scheduled Meds:  apixaban   2.5 mg Oral BID   atorvastatin    40 mg Oral QPM   famotidine   20 mg Oral QPM   furosemide   40 mg Intravenous Daily   isosorbide  mononitrate  30 mg Oral Daily   mexiletine  250 mg Oral Q12H   pantoprazole   40 mg Oral QAC breakfast   QUEtiapine   25 mg Oral Q24H   quiNIDine sulfate   200 mg Oral BID   sodium chloride  flush  3 mL Intravenous Q12H   Continuous Infusions:  sodium chloride  50 mL/hr at 04/14/23 1023   promethazine  (PHENERGAN ) injection (IM or IVPB)       LOS: 3 days   [name] Skeeter, MD [facility]  04/15/2023, 1:09 PM   *Please note that this is a verbal dictation therefore any spelling or grammatical errors are due to the Dragon Medical One system interpretation.  Please page via Amion and do not message via secure chat for urgent patient care matters. Secure chat can be used for non urgent patient care matters.  How to contact the [facility] Attending or Consulting provider 7A - 7P or covering provider during after hours 7P -7A, for this patient?  Check the care team in [facility] and look for a) attending/consulting [facility] provider listed and b) the [facility] team listed. Page or secure chat 7A-7P. Log into [email or web address] and use [place]'s universal password to access. If you do not have the password, please contact the hospital operator. Locate the [facility] provider you are looking for under [facility] and page to a number that you can be directly reached. If you still have difficulty reaching the provider, please page the [facility] (Director on Call) for the Hospitalists listed on amion for assistance.

## 2023-04-16 DIAGNOSIS — Z515 Encounter for palliative care: Secondary | ICD-10-CM | POA: Diagnosis not present

## 2023-04-16 DIAGNOSIS — Z7189 Other specified counseling: Secondary | ICD-10-CM | POA: Diagnosis not present

## 2023-04-16 DIAGNOSIS — I472 Ventricular tachycardia, unspecified: Secondary | ICD-10-CM | POA: Diagnosis not present

## 2023-04-16 LAB — BASIC METABOLIC PANEL
Anion gap: 12 (ref 5–15)
BUN: 27 mg/dL — ABNORMAL HIGH (ref 8–23)
CO2: 21 mmol/L — ABNORMAL LOW (ref 22–32)
Calcium: 8.2 mg/dL — ABNORMAL LOW (ref 8.9–10.3)
Chloride: 100 mmol/L (ref 98–111)
Creatinine, Ser: 2 mg/dL — ABNORMAL HIGH (ref 0.61–1.24)
GFR, Estimated: 32 mL/min — ABNORMAL LOW (ref >60)
Glucose, Bld: 113 mg/dL — ABNORMAL HIGH (ref 70–99)
Potassium: 4.1 mmol/L (ref 3.5–5.1)
Sodium: 133 mmol/L — ABNORMAL LOW (ref 135–145)

## 2023-04-16 LAB — TSH: TSH: 3.394 u[IU]/mL (ref 0.350–4.500)

## 2023-04-16 LAB — CBC
HCT: 36.7 % — ABNORMAL LOW (ref 39.0–52.0)
Hemoglobin: 12.1 g/dL — ABNORMAL LOW (ref 13.0–17.0)
MCH: 29.7 pg (ref 26.0–34.0)
MCHC: 33 g/dL (ref 30.0–36.0)
MCV: 90.2 fL (ref 80.0–100.0)
Platelets: 250 10*3/uL (ref 150–400)
RBC: 4.07 MIL/uL — ABNORMAL LOW (ref 4.22–5.81)
RDW: 14.8 % (ref 11.5–15.5)
WBC: 10.5 10*3/uL (ref 4.0–10.5)
nRBC: 0 % (ref 0.0–0.2)

## 2023-04-16 LAB — LACTIC ACID, PLASMA: Lactic Acid, Venous: 1.4 mmol/L (ref 0.5–1.9)

## 2023-04-16 MED ORDER — QUINIDINE SULFATE 200 MG PO TABS
200.0000 mg | ORAL_TABLET | Freq: Three times a day (TID) | ORAL | Status: DC
  Administered 2023-04-16 – 2023-04-17 (×3): 200 mg via ORAL
  Filled 2023-04-16 (×5): qty 1

## 2023-04-16 NOTE — Progress Notes (Signed)
 Mobility Specialist Progress Note:   04/16/23 1619  Mobility  Activity Transferred from bed to chair  Level of Assistance Contact guard assist, steadying assist  Assistive Device Front wheel walker  Distance Ambulated (ft) 2 ft  Activity Response Tolerated well  Mobility Referral Yes  Mobility visit 1 Mobility  Mobility Specialist Start Time (ACUTE ONLY) 1545  Mobility Specialist Stop Time (ACUTE ONLY) 1552  Mobility Specialist Time Calculation (min) (ACUTE ONLY) 7 min   Pt received in chair, agreeable to return to bed per RN request. CG to stand and pivot to bed. Upon standing pt found to have BM in chair. RN present to perform pericare. Pt returned to bed with call bell in reach and all needs met.   Brown Husband  Mobility Specialist Please contact via [employer] office at [phone]

## 2023-04-16 NOTE — Evaluation (Signed)
 Occupational Therapy Evaluation Patient Details Name: [name] MRN: [id number] DOB: 1933-03-20 Today's Date: 04/16/2023   History of Present Illness [name] is a 89 y.o. male admitted 04/13/23 for VT storm. PMH of permanent AF, sCHF s/p ICD, VT, CAD s/p CABG with AVR   Clinical Impression   Pt presents with decline in function and safety with ADLs and ADL mobility with impaired strength, balance and endurance. PTA pt lived at home with his wife and was Ind with ADLs/selfcare, cooking and mobility with use of rollator and functioning at supervision level until a few days ago when he became so weak he couldn't get up on his own. Pt currently required mod A with LB ADLs, min A with mobility/transfers using RW and max A with toileting tasks. Pt would benefit from acute OT services to address impairments to maximize level of function and safety      If plan is discharge home, recommend the following: Assistance with cooking/housework;Assist for transportation;Help with stairs or ramp for entrance;A lot of help with bathing/dressing/bathroom;A little help with walking and/or transfers    Functional Status Assessment  Patient has had a recent decline in their functional status and demonstrates the ability to make significant improvements in function in a reasonable and predictable amount of time.  Equipment Recommendations  BSC/3in1    Recommendations for Other Services       Precautions / Restrictions Precautions Precautions: Fall Precaution Comments: watch SpO2 & BP Restrictions Weight Bearing Restrictions Per Provider Order: No      Mobility Bed Mobility               General bed mobility comments: pt seated in recliner upon arrival    Transfers Overall transfer level: Needs assistance Equipment used: Rolling walker (2 wheels) Transfers: Sit to/from Stand, Bed to chair/wheelchair/BSC Sit to Stand: Min assist     Step pivot transfers: Min assist      General transfer comment: Sit - stand from recliner at RW and pt with BM, OT assisted pt with clean up. Min A for RW mgt attempting to walk to bathroom, pt began having BM again and was provided with hygiene assist and returned to recliber      Balance Overall balance assessment: Needs assistance Sitting-balance support: No upper extremity supported, Feet supported Sitting balance-Leahy Scale: Good     Standing balance support: Bilateral upper extremity supported, During functional activity, Reliant on assistive device for balance Standing balance-Leahy Scale: Poor                             ADL either performed or assessed with clinical judgement   ADL Overall ADL's : Needs assistance/impaired Eating/Feeding: Independent   Grooming: Wash/dry hands;Wash/dry face;Supervision/safety;Sitting   Upper Body Bathing: Supervision/ safety;Sitting   Lower Body Bathing: Moderate assistance   Upper Body Dressing : Supervision/safety;Sitting   Lower Body Dressing: Moderate assistance   Toilet Transfer: Minimal assistance;Ambulation;Rolling walker (2 wheels)   Toileting- Clothing Manipulation and Hygiene: Maximal assistance;Sit to/from stand       Functional mobility during ADLs: Minimal assistance;Rolling walker (2 wheels)       Vision Baseline Vision/History: 1 Wears glasses Ability to See in Adequate Light: 0 Adequate Patient Visual Report: No change from baseline       Perception         Praxis         Pertinent Vitals/Pain Pain Assessment Pain Assessment: No/denies pain Faces  Pain Scale: No hurt     Extremity/Trunk Assessment Upper Extremity Assessment Upper Extremity Assessment: Generalized weakness   Lower Extremity Assessment Lower Extremity Assessment: Defer to PT evaluation   Cervical / Trunk Assessment Cervical / Trunk Assessment: Kyphotic   Communication Communication Communication: Hearing impairment   Cognition Arousal:  Alert Behavior During Therapy: Flat affect Overall Cognitive Status: Within Functional Limits for tasks assessed                                 General Comments: HOH     General Comments  VSS, pt with +BM in the bed, pt dependent for hygiene    Exercises     Shoulder Instructions      Home Living Family/patient expects to be discharged to:: Private residence Living Arrangements: Spouse/significant other Available Help at Discharge: Family;Available 24 hours/day Type of Home: House Home Access: Ramped entrance;Stairs to enter     Home Layout: One level     Bathroom Shower/Tub: Producer, Television/film/video: Standard     Home Equipment: Rollator (4 wheels)          Prior Functioning/Environment Prior Level of Function : Independent/Modified Independent             Mobility Comments: uses rollator when out in community and intermittently at home, drives ADLs Comments: reports Ind with ADLs/selfcare; sponge bathing, reports doing the cooking        OT Problem List: Decreased strength;Decreased activity tolerance;Impaired balance (sitting and/or standing);Decreased safety awareness;Decreased knowledge of use of DME or AE      OT Treatment/Interventions: Self-care/ADL training;Therapeutic exercise;DME and/or AE instruction;Balance training;Patient/family education;Therapeutic activities    OT Goals(Current goals can be found in the care plan section) Acute Rehab OT Goals Patient Stated Goal: go home OT Goal Formulation: With patient Time For Goal Achievement: 04/30/23 Potential to Achieve Goals: Good ADL Goals Pt Will Perform Grooming: with contact guard assist;standing Pt Will Perform Upper Body Bathing: with set-up;sitting Pt Will Perform Lower Body Bathing: with min assist;sit to/from stand Pt Will Perform Upper Body Dressing: with set-up;sitting Pt Will Transfer to Toilet: with contact guard assist;ambulating Pt Will Perform Toileting  - Clothing Manipulation and hygiene: with mod assist;with min assist;sit to/from stand  OT Frequency: Min 1X/week    Co-evaluation              AM-PAC OT 6 Clicks Daily Activity     Outcome Measure Help from another person eating meals?: None Help from another person taking care of personal grooming?: A Little Help from another person toileting, which includes using toliet, bedpan, or urinal?: A Lot Help from another person bathing (including washing, rinsing, drying)?: A Lot Help from another person to put on and taking off regular upper body clothing?: A Little Help from another person to put on and taking off regular lower body clothing?: A Lot 6 Click Score: 16   End of Session Equipment Utilized During Treatment: Gait belt;Rolling walker (2 wheels)  Activity Tolerance: Patient tolerated treatment well Patient left: in chair;with call bell/phone within reach  OT Visit Diagnosis: Unsteadiness on feet (R26.81);Muscle weakness (generalized) (M62.81)                Time: 8878-8852 OT Time Calculation (min): 26 min Charges:  OT General Charges $OT Visit: 1 Visit OT Evaluation $OT Eval Moderate Complexity: 1 Mod OT Treatments $Therapeutic Activity: 8-22 mins    [name]  Jeanette 04/16/2023, 1:04 PM

## 2023-04-16 NOTE — Evaluation (Signed)
 Physical Therapy Evaluation Patient Details Name: [name] MRN: [id number] DOB: 1932-08-31 Today's Date: 04/16/2023  History of Present Illness  [name] is a 89 y.o. male admitted 04/13/23 for VT storm. PMH of permanent AF, sCHF s/p ICD, VT, CAD s/p CABG with AVR   Clinical Impression  Pt admitted with above. Pt was ambulating with use of rollator and functioning at supervision level until a few days ago when he became so weak he couldn't get up on his own. Per chart they suspect that patient isn't tolerating PO amio which is causing the nausea, vomiting, diarrhea, and weakness. At this time pt requiring min/modA for mobility and hasn't began ambulating again. Pt's son [name] and patient strongly desire to return home with spouse however [name] states they do not have 24/7 assist at this time. I am hopefully now that the amio has been stopped pt with continue to progress functionally and gain strength to achieve safe supervision level of function with rollator, however will continues to assess need for inpatient rehab < 3 hrs a day if patient with slower progress. Acute PT to cont to follow.        If plan is discharge home, recommend the following: A little help with walking and/or transfers;A little help with bathing/dressing/bathroom;Assist for transportation;Help with stairs or ramp for entrance;Assistance with cooking/housework   Can travel by private vehicle        Equipment Recommendations None recommended by PT  Recommendations for Other Services       Functional Status Assessment Patient has had a recent decline in their functional status and demonstrates the ability to make significant improvements in function in a reasonable and predictable amount of time.     Precautions / Restrictions Precautions Precautions: Fall Precaution Comments: watch SpO2 & BP Restrictions Weight Bearing Restrictions Per Provider Order: No      Mobility  Bed Mobility Overal bed  mobility: Modified Independent Bed Mobility: Rolling, Sidelying to Sit Rolling: Supervision Sidelying to sit: Mod assist       General bed mobility comments: pt used bed rails to roll L/R for hygiene s/p BM, modA for trunk elevation to complete sitting EOB    Transfers Overall transfer level: Needs assistance Equipment used: Rolling walker (2 wheels) Transfers: Sit to/from Stand, Bed to chair/wheelchair/BSC Sit to Stand: Min assist   Step pivot transfers: Min assist       General transfer comment: minA to power up, bed elevated, slow, guarded, cautious, short shuffled steps to the chair, minA for walker management, mild SOB, mild lightheadedness due to not being up in 4 days per patient    Ambulation/Gait               General Gait Details: deferred due to weakness and fatigue  Stairs            Wheelchair Mobility     Tilt Bed    Modified Rankin (Stroke Patients Only)       Balance Overall balance assessment: Needs assistance Sitting-balance support: No upper extremity supported, Feet supported Sitting balance-Leahy Scale: Good     Standing balance support: Bilateral upper extremity supported, During functional activity, Reliant on assistive device for balance Standing balance-Leahy Scale: Poor Standing balance comment: reliant on BUE support                             Pertinent Vitals/Pain Pain Assessment Pain Assessment: No/denies pain  Home Living Family/patient expects to be discharged to:: Private residence Living Arrangements: Spouse/significant other Available Help at Discharge: Family;Available 24 hours/day (wife, but does have her own health obstacles.) Type of Home: House Home Access: Ramped entrance;Stairs to enter       Home Layout: One level Home Equipment: Rollator (4 wheels)      Prior Function Prior Level of Function : Independent/Modified Independent             Mobility Comments: uses rollator when  out in community and intermittently at home, drives ADLs Comments: reports sponge bathing, reports doing the cooking     Extremity/Trunk Assessment   Upper Extremity Assessment Upper Extremity Assessment: Generalized weakness    Lower Extremity Assessment Lower Extremity Assessment: Generalized weakness    Cervical / Trunk Assessment Cervical / Trunk Assessment: Kyphotic  Communication   Communication Communication: Hearing impairment  Cognition Arousal: Alert Behavior During Therapy: WFL for tasks assessed/performed Overall Cognitive Status: Within Functional Limits for tasks assessed                                 General Comments: hard of hearing        General Comments General comments (skin integrity, edema, etc.): VSS, pt with +BM in the bed, pt dependent for hygiene    Exercises     Assessment/Plan    PT Assessment Patient needs continued PT services  PT Problem List Decreased strength;Decreased activity tolerance;Decreased balance;Decreased mobility       PT Treatment Interventions DME instruction;Gait training;Stair training;Functional mobility training;Balance training    PT Goals (Current goals can be found in the Care Plan section)  Acute Rehab PT Goals Patient Stated Goal: to get stronger PT Goal Formulation: With patient Time For Goal Achievement: 04/30/23 Potential to Achieve Goals: Good    Frequency Min 1X/week     Co-evaluation               AM-PAC PT 6 Clicks Mobility  Outcome Measure Help needed turning from your back to your side while in a flat bed without using bedrails?: A Little Help needed moving from lying on your back to sitting on the side of a flat bed without using bedrails?: A Little Help needed moving to and from a bed to a chair (including a wheelchair)?: A Little Help needed standing up from a chair using your arms (e.g., wheelchair or bedside chair)?: A Lot Help needed to walk in hospital room?: A  Lot Help needed climbing 3-5 steps with a railing? : A Lot 6 Click Score: 15    End of Session Equipment Utilized During Treatment: Gait belt Activity Tolerance: Patient tolerated treatment well Patient left: in chair;with call bell/phone within reach Nurse Communication: Mobility status PT Visit Diagnosis: Unsteadiness on feet (R26.81);Muscle weakness (generalized) (M62.81);Other abnormalities of gait and mobility (R26.89)    Time: 1010-1029 PT Time Calculation (min) (ACUTE ONLY): 19 min   Charges:   PT Evaluation $PT Eval Low Complexity: 1 Low   PT General Charges $$ ACUTE PT VISIT: 1 Visit         Norene Ames, PT, DPT Acute Rehabilitation Services Secure chat preferred Office #: [phone]   Norene CHRISTELLA Ames 04/16/2023, 12:00 PM

## 2023-04-16 NOTE — Progress Notes (Signed)
 PROGRESS NOTE    [name]  FMW:[id number] DOB: [birth year] DOA: 04/12/2023 PCP: [name] Senior, MD   Brief Narrative:  [name] is a 89 y.o. male with medical history significant for hypertension, hyperlipidemia, CAD, atrial fibrillation on Eliquis , AAS status post bioprosthetic AVR, HFrEF, and ventricular tachycardia who presented with nausea and fatigue.   Patient was recently admitted with VT storm requiring ATP.  Sotalol  was stopped due to ineffectiveness and he was transition to amiodarone  and had mexiletine increased.  He continued to have ATP despite reloading IV amiodarone , was not felt to be a candidate for invasive EP procedures, and was eventually discharged home per patient's wishes with the understanding that he would continue to have VT.    Since the discharge, he was experiencing significant nausea that was attributed to his medications and EP recommended decreasing his amiodarone .   Upon arrival to the ED, patient is found to be afebrile and saturating well on room air with normal heart rate and stable blood pressure.  Labs were most notable for potassium 5.5, creatinine 2.15, normal WBC, and troponin 26.   Patient admitted under hospital service, cardiology consulted.  Assessment & Plan:   Principal Problem:   Ventricular tachycardia (HCC) Active Problems:   Coronary artery disease   Persistent atrial fibrillation (HCC)   Stage 4 chronic kidney disease (HCC)   Hyperkalemia   Chronic combined systolic (congestive) and diastolic (congestive) heart failure (HCC)   Ventricular tachycardia, sustained (HCC)  1. Ventricular tachycardia  - Appreciate cardiology consultation, patient was initially on amiodarone  drip, eventually transitioned to mexiletine and quinidine.  Still having nonsustained VT, quinidine increased to 3 times daily dosage today.  Cardiology managing.    2. CAD  -Asymptomatic, continue nitrate, he is not on any beta-blockers.   3. HFrEF   Appear dry yesterday, received some IV fluids day before yesterday by cardiology.  Now appears euvolemic, denies any shortness of breath.  Diuretics per cardiology.   4. Atrial fibrillation  Continue Eliquis .  Management per cardiology.   5. CKD 4; hyperkalemia  -Baseline creatinine around 2.4-2.5, SCr, much better now.  Mild hyperkalemia treated with Lokelma  in the ED and normalized now.   Hyperlipidemia: Continue atorvastatin .  Delirium: Per son, he was confused when he woke up.  I am not surprised because that is not uncommon and elderly people however I saw him around 9:30 AM, he was fully alert and oriented, much better than yesterday.  Continue Seroquel  tonight. 1. Avoid benzodiazepines, antihistamines, anticholinergics, and minimize opiate use as these may worsen delirium. 2: Assess, prevent and manage pain as lack of treatment can result in delirium.  3: Provide appropriate lighting and clear signage; a clock and calendar should be easily visible to the patient. 4: Monitor environmental factors. Reduce light and noise at night (close shades, turn off lights, turn off TV, ect). Correct any alterations in sleep cycle. 5: Reorient the patient to person, place, time and situation on each encounter.  6: Correct sensory deficits if possible (replace eye glasses, hearing aids, ect). 7: Avoid restraints if able. Severely delirious patients benefit from constant observation by a sitter.  DVT prophylaxis: apixaban  (ELIQUIS ) tablet 2.5 mg Start: 04/13/23 0200   Code Status: Limited: Do not attempt resuscitation (DNR) -DNR-LIMITED -Do Not Intubate/DNI   Family Communication: dtr present at bedside.  Plan of care discussed with patient in length and he/she verbalized understanding and agreed with it.  Status is: Inpatient Remains inpatient appropriate because: Still having VT,  needs further monitoring as inpatient.   Estimated body mass index is 26.16 kg/m as calculated from the following:    Height as of this encounter: 6' (1.829 m).   Weight as of this encounter: 87.5 kg.    Nutritional Assessment: Body mass index is 26.16 kg/m.SABRA Seen by dietician.  I agree with the assessment and plan as outlined below: Nutrition Status:        . Skin Assessment: I have examined the patient's skin and I agree with the wound assessment as performed by the wound care RN as outlined below:    Consultants:  Cardiology  Procedures:  None  Antimicrobials:  Anti-infectives (From admission, onward)    None         Subjective: Seen and examined.  Patient had no complaint.  He was fully alert and oriented.  Objective: Vitals:   04/16/23 0345 04/16/23 0539 04/16/23 0758 04/16/23 1250  BP: (!) 92/47  118/63 101/75  Pulse: 88 77 77 96  Resp: 16 16 15 20   Temp: 97.7 F (36.5 C)  (!) 96.9 F (36.1 C) 97.6 F (36.4 C)  TempSrc: Oral  Axillary Oral  SpO2: 93% 97% 92% 95%  Weight: 87.5 kg     Height:        Intake/Output Summary (Last 24 hours) at 04/16/2023 1332 Last data filed at 04/16/2023 1200 Gross per 24 hour  Intake 490 ml  Output 1000 ml  Net -510 ml   Filed Weights   04/14/23 0551 04/15/23 0523 04/16/23 0345  Weight: 88.3 kg 89.3 kg 87.5 kg    Examination:  General exam: Appears calm and comfortable  Respiratory system: Clear to auscultation. Respiratory effort normal. Cardiovascular system: S1 & S2 heard, RRR. No JVD, murmurs, rubs, gallops or clicks. No pedal edema. Gastrointestinal system: Abdomen is nondistended, soft and nontender. No organomegaly or masses felt. Normal bowel sounds heard. Central nervous system: Alert and oriented. No focal neurological deficits. Extremities: Symmetric 5 x 5 power. Skin: No rashes, lesions or ulcers.    Data Reviewed: I have personally reviewed following labs and imaging studies  CBC: Recent Labs  Lab 04/12/23 1639 04/13/23 0238 04/14/23 0301 04/15/23 0232 04/16/23 0318  WBC 9.4 10.5 13.1* 9.7 10.5   NEUTROABS 6.4  --   --   --   --   HGB 14.7 13.1 13.2 12.5* 12.1*  HCT 45.7 41.2 40.9 37.0* 36.7*  MCV 93.5 94.5 91.1 89.6 90.2  PLT 292 282 313 277 250   Basic Metabolic Panel: Recent Labs  Lab 04/12/23 1729 04/13/23 0238 04/14/23 0304 04/15/23 0232 04/16/23 0318  NA 136 136 133* 133* 133*  K 5.5* 3.9 3.7 3.9 4.1  CL 103 100 96* 101 100  CO2 24 22 22  21* 21*  GLUCOSE 116* 163* 129* 109* 113*  BUN 26* 24* 25* 23 27*  CREATININE 2.15* 2.24* 2.27* 1.94* 2.00*  CALCIUM  8.2* 8.4* 8.7* 8.3* 8.2*  MG 2.4  --   --   --   --    GFR: Estimated Creatinine Clearance: 28 mL/min (A) (by C-G formula based on SCr of 2 mg/dL (H)). Liver Function Tests: No results for input(s): AST, ALT, ALKPHOS, BILITOT, PROT, ALBUMIN in the last 168 hours. No results for input(s): LIPASE, AMYLASE in the last 168 hours. No results for input(s): AMMONIA in the last 168 hours. Coagulation Profile: No results for input(s): INR, PROTIME in the last 168 hours. Cardiac Enzymes: No results for input(s): CKTOTAL, CKMB, CKMBINDEX, TROPONINI in the  last 168 hours. BNP (last 3 results) No results for input(s): PROBNP in the last 8760 hours. HbA1C: No results for input(s): HGBA1C in the last 72 hours. CBG: Recent Labs  Lab 04/12/23 1659  GLUCAP 123*   Lipid Profile: No results for input(s): CHOL, HDL, LDLCALC, TRIG, CHOLHDL, LDLDIRECT in the last 72 hours. Thyroid  Function Tests: Recent Labs    04/16/23 0318  TSH 3.394   Anemia Panel: No results for input(s): VITAMINB12, FOLATE, FERRITIN, TIBC, IRON, RETICCTPCT in the last 72 hours. Sepsis Labs: Recent Labs  Lab 04/16/23 0008  LATICACIDVEN 1.4    No results found for this or any previous visit (from the past 240 hours).   Radiology Studies: No results found.   Scheduled Meds:  apixaban   2.5 mg Oral BID   atorvastatin   40 mg Oral QPM   famotidine   20 mg Oral QPM   furosemide   40 mg  Intravenous Daily   isosorbide  mononitrate  30 mg Oral Daily   mexiletine  250 mg Oral Q12H   pantoprazole   40 mg Oral QAC breakfast   polyethylene glycol  17 g Oral Daily   QUEtiapine   25 mg Oral Q24H   quiNIDine sulfate   200 mg Oral TID   sodium chloride  flush  3 mL Intravenous Q12H   Continuous Infusions:  promethazine  (PHENERGAN ) injection (IM or IVPB)       LOS: 4 days   Fredia Skeeter, MD [facility]  04/16/2023, 1:32 PM   *Please note that this is a verbal dictation therefore any spelling or grammatical errors are due to the Dragon [employer] One system interpretation.  Please page via Amion and do not message via secure chat for urgent patient care matters. Secure chat can be used for non urgent patient care matters.  How to contact the [facility] Attending or Consulting provider 7A - 7P or covering provider during after hours 7P -7A, for this patient?  Check the care team in [facility] and look for a) attending/consulting [facility] provider listed and b) the [facility] team listed. Page or secure chat 7A-7P. Log into [email or web address] and use 's universal password to access. If you do not have the password, please contact the hospital operator. Locate the [facility] provider you are looking for under [facility] and page to a number that you can be directly reached. If you still have difficulty reaching the provider, please page the [facility] (Director on Call) for the Hospitalists listed on amion for assistance.

## 2023-04-16 NOTE — Progress Notes (Addendum)
 Palliative Medicine Inpatient Follow Up Note HPI: [name] is a 89 y.o. male with medical history significant for hypertension, hyperlipidemia, CAD, atrial fibrillation on Eliquis , AAS status post bioprosthetic AVR, HFrEF, and ventricular tachycardia who presented with nausea and fatigue. Palliative care saw [name] on his last admission.   Today's Discussion 04/16/2023  *Please note that this is a verbal dictation therefore any spelling or grammatical errors are due to the Dragon Medical One system interpretation.  A conversations was held first this this morning with patients daughter, [name]. She and I discussed patients present state and the plan for a meeting at 9:15. She shares that she will not be present though her brother, [name] will be.   [name] asked questions about Palliative and hospice care which I was able to answer. I also shared that [facility] would be reaching out to better explain their services of both their Palliative are versus Hospice arm of care. _____________________________  Chart reviewed inclusive of vital signs, progress notes, laboratory results, and diagnostic images.   I met with Dr. Inocencio, Daphne Barrack, APP, and patiens son, [name] this morning at [facility] bedside. We discussed patients present health in the setting of his persistent episodes of V-Tach. We reviewed that [name] has been having a difficult time more recently thought to be in the setting of his amiodarone .   Dr. Inocencio was able to answer an abundance of family related questions regarding patients present drug regiment. He was able to provide clarity on which drugs have been chosen for [name] and why.   Reviewed patients code status and confirmed with patients that he would not desire chest compressions, intubation, or mechanical ventilatory support.   Discussions related to patients present pacemaker and AICD were held. Dr. Inocencio explained that [name]' pacemaker and the  antitachycardia (ATP) setting are not painful or distressing to the patient. He did chare that when shocked and in [name] case this would likely occur only if he had unsustained VT to the 170's that it can be quite painful. It was discussed whether the painful shocks should be continued or not. He further reviewed all shock settings can be deactivated if the family desires this.    Created space and opportunity for patient to explore thoughts feelings and fears regarding current medical situation.He feels that he has an understanding of the context of the conversation. Reviewed the importance of always considering [name]' quality of life. Discussed if the burden of the medicines are too great then they can always be stopped and the emphasis of care can be towards full comfort.   One of the goals of the patient and his family are for him to gain strength. We discussed the plan for PT/OT to come by today for assessment.  ______________  Allowed time and space after the EP team left to allow patient and his son to express thoughts and concerns. Patient himself wants to feel better. He is encouraged as his breathing is easier today and he is not a tremulous.   Questions and concerns addressed/Palliative Support Provided.   Objective Assessment: Vital Signs Vitals:   04/16/23 0539 04/16/23 0758  BP:  118/63  Pulse: 77 77  Resp: 16 15  Temp:  (!) 96.9 F (36.1 C)  SpO2: 97% 92%    Intake/Output Summary (Last 24 hours) at 04/16/2023 0819 Last data filed at 04/15/2023 2000 Gross per 24 hour  Intake 250 ml  Output 1750 ml  Net -1500 ml   Last  Weight  Most recent update: 04/16/2023  4:31 AM    Weight  87.5 kg (192 lb 14.4 oz)            Gen:  Elderly Caucasian M in NAD HEENT: moist mucous membranes CV: Regular rate and irregular rhythm  PULM:  On RA, appears short of breath ABD: Not distended EXT: No edema  Neuro: Alert and oriented x3 - hard of hearing  SUMMARY OF RECOMMENDATIONS    DNAR/DNI --> Patient and his family are considering whether to deactivate AICD   A meeting was held with patient and his son, [name] by the EP specialists - Dr. Inocencio and Daphne Barrack --> Patients disease explained as were the medicines he was on. Discussion regarding risk versus benefits of present treatments were held  Plan for PT/OT  Allowing time for outcomes  Appreciate [facility] involvement --> [name] is reaching out to patients daughter  [name] to better explain their Palliative vs. Hospice services  Ongoing PMT support  Time Spent: 86 Billing based on MDM: High ______________________________________________________________________________________ [name] [place] Palliative Medicine Team Team Cell Phone: [phone] Please utilize secure chat with additional questions, if there is no response within 30 minutes please call the above phone number  Palliative Medicine Team providers are available by phone from 7am to 7pm daily and can be reached through the team cell phone.  Should this patient require assistance outside of these hours, please call the patient's attending physician.

## 2023-04-16 NOTE — Progress Notes (Signed)
 Referral received from Palliative Care for discussion of services post discharge.  I was able to speak to daughter, [name], regarding palliative care in the home with [facility] versus Home Hospice services.  She will discuss with family and call us  back if one of these programs is felt to be beneficial.    Magdalena Berber, RN, BSN [phone]

## 2023-04-16 NOTE — Progress Notes (Addendum)
 Lactic acid is normal as the result below.   Latest Reference Range & Units Most Recent  Lactic Acid, Venous 0.5 - 1.9 mmol/L 1.4 04/16/23 00:08    Pt is asymptomatic, afebrile, BP soft in 90/49 - 95/52 mmHg, HR 70s-80s, V-paced on the monitor, normal respiration. MD aware.   Pt has a complaint of soreness in his mouth. Oral assessment is performed. We find mild swelling, redness like inflammation of his gum which locates on the right lower posterior side of his oral cavity. We will hand off to the morning round for further evaluation. Pain is tolerated by Tylenol . We continue to monitor.  Wendi Dash, RN

## 2023-04-16 NOTE — Progress Notes (Signed)
 PT Cancellation Note  Patient Details Name: BLAYNE FRANKIE MRN: [id number] DOB: 03/28/1933   Cancelled Treatment:    Reason Eval/Treat Not Completed: Other (comment). MD and palliative RN heading into room to have meeting with patient and family as pt medically and functionally declining. Acute PT to return as able, as appropriate if still needed to complete PT eval. Please cancel PT order if no longer needed.  Norene Ames, PT, DPT Acute Rehabilitation Services Secure chat preferred Office #: [phone]    Norene CHRISTELLA Ames 04/16/2023, 9:29 AM

## 2023-04-16 NOTE — TOC CM/SW Note (Signed)
 Transition of Care [facility]) - Inpatient Brief Assessment Rayfield Gobble RN, BSN Transitions of Care Unit 4E- RN Case Manager See Treatment Team for direct phone #   Patient Details  Name: SAGAR TENGAN MRN: [id number] Date of Birth: [birth year]  Transition of Care [facility]) CM/SW Contact:    Gobble Rayfield Hurst, RN Phone Number: 04/16/2023, 3:09 PM   Clinical Narrative: Pt re-admitted from home, active with [facility] for [facility], will need new orders for resumption of HH on discharge. [facility] also following for outpt PC vs home Hospice needs.   TOC will follow for coordination of care.    Transition of Care Asessment: Insurance and Status: Insurance coverage has been reviewed Patient has primary care physician: Yes Home environment has been reviewed: home w/ spouse Prior level of function:: self w/ assist Prior/Current Home Services: Current home services (Active w/ [facility] for [facility], Active w/ [facility] for outpt PC) Social Drivers of Health Review: SDOH reviewed no interventions necessary Readmission risk has been reviewed: Yes Transition of care needs: transition of care needs identified, TOC will continue to follow

## 2023-04-17 DIAGNOSIS — Z7189 Other specified counseling: Secondary | ICD-10-CM | POA: Diagnosis not present

## 2023-04-17 DIAGNOSIS — I472 Ventricular tachycardia, unspecified: Secondary | ICD-10-CM | POA: Diagnosis not present

## 2023-04-17 DIAGNOSIS — Z515 Encounter for palliative care: Secondary | ICD-10-CM | POA: Diagnosis not present

## 2023-04-17 LAB — BASIC METABOLIC PANEL
Anion gap: 10 (ref 5–15)
BUN: 33 mg/dL — ABNORMAL HIGH (ref 8–23)
CO2: 22 mmol/L (ref 22–32)
Calcium: 8 mg/dL — ABNORMAL LOW (ref 8.9–10.3)
Chloride: 101 mmol/L (ref 98–111)
Creatinine, Ser: 2.19 mg/dL — ABNORMAL HIGH (ref 0.61–1.24)
GFR, Estimated: 28 mL/min — ABNORMAL LOW (ref >60)
Glucose, Bld: 116 mg/dL — ABNORMAL HIGH (ref 70–99)
Potassium: 4.4 mmol/L (ref 3.5–5.1)
Sodium: 133 mmol/L — ABNORMAL LOW (ref 135–145)

## 2023-04-17 NOTE — Progress Notes (Signed)
 Mobility Specialist Progress Note:   04/17/23 1526  Mobility  Activity Transferred from chair to bed  Level of Assistance Minimal assist, patient does 75% or more  Assistive Device Front wheel walker  Distance Ambulated (ft) 10 ft  Activity Response Tolerated well  Mobility Referral Yes  Mobility visit 1 Mobility  Mobility Specialist Start Time (ACUTE ONLY) 1430  Mobility Specialist Stop Time (ACUTE ONLY) 1445  Mobility Specialist Time Calculation (min) (ACUTE ONLY) 15 min   Pre Mobility: 102 HR  During Mobility: 147 HR  Post Mobility: 93 HR   Pt received in chair, agreeable to mobility. Pt needing MinA to stand. CG during ambulation. Pt displayed slight knee buckle when ambulating around bed requiring chair follow for safety. HR peaked at 147 bpm during ambulation. Pt displayed slight fatigue after ambulation and c/o feeling generally weak. Pt left in supine with call bell in reach and all needs met. RN notified.   Brown Husband  Mobility Specialist Please contact via [employer] office at [phone]

## 2023-04-17 NOTE — TOC Progression Note (Signed)
 Transition of Care (TOC) - Progression Note  Rayfield Gobble RN, BSN Transitions of Care Unit 4E- RN Case Manager See Treatment Team for direct phone #   Patient Details  Name: [name] MRN: [id number] Date of Birth: 1932/12/26  Transition of Care [facility]) CM/SW Contact  Gobble, Rayfield Hurst, RN Phone Number: 04/17/2023, 2:02 PM  Clinical Narrative:    Spoke with daughter [name]- answered questions about [facility] services and confirmed they want to continue with [facility] for [facility] needs on discharge- pt will need new HH orders placed for resumption - RN/PT/OT.   Per daughter pt does not have any new DME needs at this time.   TOC will continue to follow   Expected Discharge Plan: Home w Home Health Services Barriers to Discharge: Continued Medical Work up  Expected Discharge Plan and Services     Post Acute Care Choice: Home Health, Resumption of Svcs/PTA Provider Living arrangements for the past 2 months: Single Family Home                                       Social Determinants of Health (SDOH) Interventions SDOH Screenings   Food Insecurity: Unknown (04/13/2023)  Housing: Unknown (04/13/2023)  Transportation Needs: No Transportation Needs (03/19/2023)  Utilities: Not At Risk (03/19/2023)  Tobacco Use: Low Risk  (04/13/2023)    Readmission Risk Interventions     No data to display

## 2023-04-17 NOTE — Progress Notes (Addendum)
   Palliative Medicine Inpatient Follow Up Note HPI: [name] is a 89 y.o. male with medical history significant for hypertension, hyperlipidemia, CAD, atrial fibrillation on Eliquis , AAS status post bioprosthetic AVR, HFrEF, and ventricular tachycardia who presented with nausea and fatigue. Palliative care saw Shykeem on his last admission.   Today's Discussion 04/17/2023  *Please note that this is a verbal dictation therefore any spelling or grammatical errors are due to the Dragon Medical One system interpretation.  I spoke to [name]' RN, Jinnie this morning. She shares that [name] is doing well this morning, she has no active complaints and finds he is more clear minded today.   I met with [name] at bedside this morning. He was alert and oriented to person, place, and situation. He shares with me that his nausea is gone. He remain weak but hopes that will start to improve with therapies.  Discussed [name]' AICD and his thoughts on the de-activation of his devices shock setting. He shares that he does not see the benefit in de-activating the device at this time. I stated that he can change his mind at any point and if those are his wishes we will honor them. _____________ Addendum:  I called patients daughter, [name] and updated her to the above conversations. She is very much on board with whatever her father wants. She feels that they will need OP Palliative support on discharge as opposed to hospice at this point. [name] plans to come this afternoon for further conversations with [name] to verify he understood not wanting to turn off the painful shocks.   Questions and concerns addressed/Palliative Support Provided.   Objective Assessment: Vital Signs Vitals:   04/17/23 1104 04/17/23 1105  BP: 98/71 98/71  Pulse: (!) 101 99  Resp: 19 20  Temp: 98.2 F (36.8 C) 98 F (36.7 C)  SpO2: 95% 97%    Intake/Output Summary (Last 24 hours) at 04/17/2023 1240 Last data filed at 04/17/2023  0800 Gross per 24 hour  Intake 240 ml  Output 1200 ml  Net -960 ml   Last Weight  Most recent update: 04/17/2023  6:33 AM    Weight  89.3 kg (196 lb 13.9 oz)            Gen:  Elderly Caucasian M in NAD HEENT: moist mucous membranes CV: Regular rate and irregular rhythm  PULM:  On RA, appears short of breath ABD: Not distended EXT: No edema  Neuro: Alert and oriented x3 - hard of hearing  SUMMARY OF RECOMMENDATIONS   DNAR/DNI --> Patient would like to keep AICD active for nonpainful and painful shocks at this tiime  Allowing time for outcomes  Appreciate [facility] involvement --> Patients daughter believes Palliative is the best route at this time  Ongoing intermittent PMT support  Billing based on MDM: High ______________________________________________________________________________________ [name] Palliative Medicine Team Team Cell Phone: [phone] Please utilize secure chat with additional questions, if there is no response within 30 minutes please call the above phone number  Palliative Medicine Team providers are available by phone from 7am to 7pm daily and can be reached through the team cell phone.  Should this patient require assistance outside of these hours, please call the patient's attending physician.

## 2023-04-17 NOTE — Progress Notes (Signed)
 Mobility Specialist Progress Note:   04/17/23 1116  Mobility  Activity Transferred from bed to chair  Level of Assistance Minimal assist, patient does 75% or more  Assistive Device Front wheel walker  Distance Ambulated (ft) 10 ft  Activity Response Tolerated well  Mobility Referral Yes  Mobility visit 1 Mobility  Mobility Specialist Start Time (ACUTE ONLY) [id number]  Mobility Specialist Stop Time (ACUTE ONLY) 0950  Mobility Specialist Time Calculation (min) (ACUTE ONLY) 24 min    Pt received in bed, agreeable to mobility. MinA bed mobility. CG to stand and and ambulate around bed to chair. HR peaked at 123 bpm during session. Pt c/o general weakness, otherwise asx throughout. Pt left in chair with call bell in reach and all needs met.  Brown Husband  Mobility Specialist Please contact via [employer] office at [phone]

## 2023-04-17 NOTE — Consult Note (Signed)
 [facility] Liaison Consult Note   04/17/2023  [name] 18-Jun-1932 [id number]  Insurance: [employer]   Primary Care Provider: Nanci Senior, MD, with Margarete at [facility], this provider is listed for the transition of care follow up appointments  and [facility] Transition calls   [facility] Liaison met patient at bedside at [facility]. Patient up in recliner. Patient states he was a little hard of hearing when speaking with him at a distance. Patient was alert and oriented, HIPAA questions answered.   The patient was screened for 30 day readmission hospitalization with noted extreme high risk score for unplanned readmission risk 2 hospital admissions in 6 months.  The patient was assessed for potential Community Care Coordination service needs for post hospital transition for care coordination. Review of patient's electronic medical record reveals patient is admitted with Ventricular Tachycardia.   Plan: [facility] Liaison will continue to follow progress and disposition to asess for post hospital community care coordination/management needs.  Referral request for community care coordination: pending referrals and disposition.   VBCI Community Care, Population Health does not replace or interfere with any arrangements made by the Inpatient Transition of Care team.   For questions contact:   Richerd Fish, RN, BSN, CCM [place]  [facility], [place], [facility] Liaison Direct Dial: [phone] or secure chat Email: [email or web address]

## 2023-04-17 NOTE — Plan of Care (Signed)
   Problem: Education: Goal: Knowledge of General Education information will improve Description Including pain rating scale, medication(s)/side effects and non-pharmacologic comfort measures Outcome: Progressing   Problem: Health Behavior/Discharge Planning: Goal: Ability to manage health-related needs will improve Outcome: Progressing

## 2023-04-17 NOTE — Progress Notes (Addendum)
 Patient Name: [name] Date of Encounter: 04/17/2023  Primary Cardiologist: Wilbert Bihari, MD Electrophysiologist: Soyla Gladis Norton, MD  Interval Summary   The patient reports he feels fatigued / weak today.  Walked in the room with mobility tech. Feels like he Vaishali Baise need therapy at home.   At this time, the patient denies chest pain, shortness of breath, or any new concerns.  Vital Signs    Vitals:   04/17/23 0351 04/17/23 0510 04/17/23 0633 04/17/23 0812  BP: 127/64 116/70  100/68  Pulse: 92 86 94 100  Resp: 15 15 15 18   Temp: 97.7 F (36.5 C) 97.6 F (36.4 C)  97.7 F (36.5 C)  TempSrc: Oral Oral  Oral  SpO2: 93% 94% 97% 98%  Weight:   89.3 kg   Height:        Intake/Output Summary (Last 24 hours) at 04/17/2023 1001 Last data filed at 04/17/2023 0800 Gross per 24 hour  Intake 480 ml  Output 1200 ml  Net -720 ml   Filed Weights   04/15/23 0523 04/16/23 0345 04/17/23 9366  Weight: 89.3 kg 87.5 kg 89.3 kg    Physical Exam    GEN- The patient is well appearing, alert and oriented x 3 today.   Lungs- Clear to ausculation bilaterally, normal work of breathing Cardiac- Regular rate and rhythm, frequent ventricular ectopy, no murmurs, rubs or gallops GI- soft, NT, ND, + BS Extremities- no clubbing or cyanosis. No edema  Telemetry    VP 80-90's at rest with frequent NSVT.  With exertion noted to have HR into 120's / VT  (personally reviewed)  Hospital Course    [name] is a 89 y.o. male with PMH of permanent AF, sCHF s/p ICD, VT, CAD s/p CABG with AVR admitted 04/13/23 for VT storm. Recent prolonged hospitalization for VT storm and he was restarted on amiodarone  > slowed VT but continued to have recurrent bouts treated with ATP. His VT slowed to the 120's and VT zones were adjusted. He was discharged on PO amio but outpatient course was complicated by nausea/vomiting and progressive weakness.  Assessment & Plan    VT Storm  ICM s/p CABG & AVR  (remote) -continue mexiletine 250mg  BID  -continue quinidine 200 mg TID  -thus far, agents have not been able to control VT  -discussed with family / pt re: possibility of turning off ICD therapies and keeping painless therapy (ATP) in place.  Florestine Carmical follow up regarding decisions.     Nausea / Vomiting  Confusion with scopolamine  > stopped 1/5 -PRN phenergan     Permanent AF  -OAC for stroke prophylaxis   Secondary Hypercoagulable State  -Eliquis  2.5 mg BID    Confusion  Thought secondary to scopolamine , improving.  -likely some element of residual delirium / deconditioning  -improving   Dispo:  -Shawndell Varas follow up with family re: above ICD therapies -PT / OT rec's appreciated  For questions or updates, please contact [facility] Please consult [email or web address] for contact info under Cardiology/STEMI.  Signed, Daphne Barrack, MSN, APRN, NP-C, AGACNP-BC [facility] - Electrophysiology  04/17/2023, 10:05 AM  I have seen and examined this patient with Daphne Barrack.  Agree with above, note added to reflect my findings.  Patient continues to have multiple episodes of ventricular tachycardia.  Was doing well overnight with high burden of ventricular pacing, but has had significantly increased burden of ventricular tachycardia throughout the day today.  Feeling weak and fatigued.  GEN: Well nourished, well  developed, in no acute distress  HEENT: normal  Neck: no JVD, carotid bruits, or masses Cardiac: Irregular; no murmurs, rubs, or gallops,no edema  Respiratory:  clear to auscultation bilaterally, normal work of breathing GI: soft, nontender, nondistended, + BS MS: no deformity or atrophy  Skin: warm and dry, device site well healed Neuro:  Strength and sensation are intact Psych: euthymic mood, full affect   VT storm: Currently on mexiletine and quinidine.  He is unfortunately continued to have episodes of ventricular tachycardia.  Rates are slow and thus no device programming  is possible.  We did have a discussion about turning off high-voltage therapies, which I think would be reasonable.  Family was going to discuss this.  Also discussed keeping HEP on.  With his high burden of VT, likely the best option would be turning off therapies and transition into comfort care, but patient's family is not ready for this decision.  Riham Polyakov continue to have conversations.  Continue current medications. Permanent atrial fibrillation: Continue anticoagulation with Eliquis  Coronary artery disease: No current chest pain.  No indication for ischemic evaluation. Chronic systolic heart failure: No obvious volume overload.  Continue with current management. CKD stage IIIb-IV: Has remained stable  Sharen Youngren M. Ioannis Schuh MD 04/17/2023 10:27 AM

## 2023-04-17 NOTE — Progress Notes (Addendum)
 PROGRESS NOTE    [name]  FMW:[id number] DOB: [birth year] DOA: 04/12/2023 PCP: Nanci Senior, MD   Brief Narrative:  [name] is a 89 y.o. male with medical history significant for hypertension, hyperlipidemia, CAD, atrial fibrillation on Eliquis , AAS status post bioprosthetic AVR, HFrEF, and ventricular tachycardia who presented with nausea and fatigue.   Patient was recently admitted with VT storm requiring ATP.  Sotalol  was stopped due to ineffectiveness and he was transition to amiodarone  and had mexiletine increased.  He continued to have ATP despite reloading IV amiodarone , was not felt to be a candidate for invasive EP procedures, and was eventually discharged home per patient's wishes with the understanding that he would continue to have VT.    Since the discharge, he was experiencing significant nausea that was attributed to his medications and EP recommended decreasing his amiodarone .   Upon arrival to the ED, patient is found to be afebrile and saturating well on room air with normal heart rate and stable blood pressure.  Labs were most notable for potassium 5.5, creatinine 2.15, normal WBC, and troponin 26.   Patient admitted under hospital service, cardiology consulted.  Assessment & Plan:   Principal Problem:   Ventricular tachycardia (HCC) Active Problems:   Coronary artery disease   Persistent atrial fibrillation (HCC)   Stage 4 chronic kidney disease (HCC)   Hyperkalemia   Chronic combined systolic (congestive) and diastolic (congestive) heart failure (HCC)   Ventricular tachycardia, sustained (HCC)  1. Ventricular tachycardia  - Appreciate cardiology consultation, patient was initially on amiodarone  drip, eventually transitioned to mexiletine and quinidine.  Still having nonsustained VT, quinidine increased to 3 times daily dosage 04/16/2023, per cardiology, there are no more therapies other than the current regime and they recommend focusing on comfort  care and possibility of turning off ICD therapies.  Cardiology managing.    2. CAD  -Asymptomatic, continue nitrate, he is not on any beta-blockers.   3. HFrEF  Appear dry yesterday, received some IV fluids day before yesterday by cardiology.  Now appears euvolemic, denies any shortness of breath.  Diuretics per cardiology.   4. Atrial fibrillation  Continue Eliquis .  Management per cardiology.   5. CKD 4; hyperkalemia  -Baseline creatinine around 2.4-2.5, SCr, much better now.  Mild hyperkalemia treated with Lokelma  in the ED and normalized now.   Hyperlipidemia: Continue atorvastatin .  Delirium: Patient is fully alert and oriented.  Delirium has resolved.  Continue Seroquel  nightly. 1. Avoid benzodiazepines, antihistamines, anticholinergics, and minimize opiate use as these may worsen delirium. 2: Assess, prevent and manage pain as lack of treatment can result in delirium.  3: Provide appropriate lighting and clear signage; a clock and calendar should be easily visible to the patient. 4: Monitor environmental factors. Reduce light and noise at night (close shades, turn off lights, turn off TV, ect). Correct any alterations in sleep cycle. 5: Reorient the patient to person, place, time and situation on each encounter.  6: Correct sensory deficits if possible (replace eye glasses, hearing aids, ect). 7: Avoid restraints if able. Severely delirious patients benefit from constant observation by a sitter.  Received a message from palliative care to talk to the daughter.  Spoke to [facility], she was questioning why patient is on Seroquel .  I explained to her the reason for this is delirium which has improved with Seroquel .  She wanted me to discontinue Seroquel .  I did that.  DVT prophylaxis: apixaban  (ELIQUIS ) tablet 2.5 mg Start: 04/13/23 0200  Code Status: Limited: Do not attempt resuscitation (DNR) -DNR-LIMITED -Do Not Intubate/DNI   Family Communication: None present at bedside.   Plan of care discussed with patient in length and he/she verbalized understanding and agreed with it.  Status is: Inpatient Remains inpatient appropriate because: Still having VT  Estimated body mass index is 26.7 kg/m as calculated from the following:   Height as of this encounter: 6' (1.829 m).   Weight as of this encounter: 89.3 kg.    Nutritional Assessment: Body mass index is 26.7 kg/m.[name] Seen by dietician.  I agree with the assessment and plan as outlined below: Nutrition Status:        . Skin Assessment: I have examined the patient's skin and I agree with the wound assessment as performed by the wound care RN as outlined below:    Consultants:  Cardiology  Procedures:  None  Antimicrobials:  Anti-infectives (From admission, onward)    None         Subjective: Patient seen and examined.  Denied any chest pain, palpitation, dizziness or shortness of breath.  Complains of weakness.  Says  I cannot even walk.  Related his concerns to the nurse so that can be evaluated to physical therapy who has cleared him to go with home health.  Perhaps he needs reassessment by PT.  Objective: Vitals:   04/17/23 0510 04/17/23 0633 04/17/23 0812 04/17/23 1104  BP: 116/70  100/68 98/71  Pulse: 86 94 100 (!) 101  Resp: 15 15 18 19   Temp: 97.6 F (36.4 C)  97.7 F (36.5 C) 98.2 F (36.8 C)  TempSrc: Oral  Oral Oral  SpO2: 94% 97% 98% 95%  Weight:  89.3 kg    Height:        Intake/Output Summary (Last 24 hours) at 04/17/2023 1119 Last data filed at 04/17/2023 0800 Gross per 24 hour  Intake 480 ml  Output 1200 ml  Net -720 ml   Filed Weights   04/15/23 0523 04/16/23 0345 04/17/23 0633  Weight: 89.3 kg 87.5 kg 89.3 kg    Examination:  General exam: Appears calm and comfortable  Respiratory system: Clear to auscultation. Respiratory effort normal. Cardiovascular system: S1 & S2 heard, RRR. No JVD, murmurs, rubs, gallops or clicks. No pedal edema. Gastrointestinal  system: Abdomen is nondistended, soft and nontender. No organomegaly or masses felt. Normal bowel sounds heard. Central nervous system: Alert and oriented. No focal neurological deficits. Extremities: Symmetric 5 x 5 power. Skin: No rashes, lesions or ulcers.    Data Reviewed: I have personally reviewed following labs and imaging studies  CBC: Recent Labs  Lab 04/12/23 1639 04/13/23 0238 04/14/23 0301 04/15/23 0232 04/16/23 0318  WBC 9.4 10.5 13.1* 9.7 10.5  NEUTROABS 6.4  --   --   --   --   HGB 14.7 13.1 13.2 12.5* 12.1*  HCT 45.7 41.2 40.9 37.0* 36.7*  MCV 93.5 94.5 91.1 89.6 90.2  PLT 292 282 313 277 250   Basic Metabolic Panel: Recent Labs  Lab 04/12/23 1729 04/13/23 0238 04/14/23 0304 04/15/23 0232 04/16/23 0318 04/17/23 0344  NA 136 136 133* 133* 133* 133*  K 5.5* 3.9 3.7 3.9 4.1 4.4  CL 103 100 96* 101 100 101  CO2 24 22 22  21* 21* 22  GLUCOSE 116* 163* 129* 109* 113* 116*  BUN 26* 24* 25* 23 27* 33*  CREATININE 2.15* 2.24* 2.27* 1.94* 2.00* 2.19*  CALCIUM  8.2* 8.4* 8.7* 8.3* 8.2* 8.0*  MG 2.4  --   --   --   --   --  GFR: Estimated Creatinine Clearance: 25.6 mL/min (A) (by C-G formula based on SCr of 2.19 mg/dL (H)). Liver Function Tests: No results for input(s): AST, ALT, ALKPHOS, BILITOT, PROT, ALBUMIN in the last 168 hours. No results for input(s): LIPASE, AMYLASE in the last 168 hours. No results for input(s): AMMONIA in the last 168 hours. Coagulation Profile: No results for input(s): INR, PROTIME in the last 168 hours. Cardiac Enzymes: No results for input(s): CKTOTAL, CKMB, CKMBINDEX, TROPONINI in the last 168 hours. BNP (last 3 results) No results for input(s): PROBNP in the last 8760 hours. HbA1C: No results for input(s): HGBA1C in the last 72 hours. CBG: Recent Labs  Lab 04/12/23 1659  GLUCAP 123*   Lipid Profile: No results for input(s): CHOL, HDL, LDLCALC, TRIG, CHOLHDL, LDLDIRECT in  the last 72 hours. Thyroid  Function Tests: Recent Labs    04/16/23 0318  TSH 3.394   Anemia Panel: No results for input(s): VITAMINB12, FOLATE, FERRITIN, TIBC, IRON, RETICCTPCT in the last 72 hours. Sepsis Labs: Recent Labs  Lab 04/16/23 0008  LATICACIDVEN 1.4    No results found for this or any previous visit (from the past 240 hours).   Radiology Studies: No results found.   Scheduled Meds:  apixaban   2.5 mg Oral BID   atorvastatin   40 mg Oral QPM   famotidine   20 mg Oral QPM   furosemide   40 mg Intravenous Daily   isosorbide  mononitrate  30 mg Oral Daily   mexiletine  250 mg Oral Q12H   pantoprazole   40 mg Oral QAC breakfast   polyethylene glycol  17 g Oral Daily   QUEtiapine   25 mg Oral Q24H   quiNIDine sulfate   200 mg Oral TID   sodium chloride  flush  3 mL Intravenous Q12H   Continuous Infusions:  promethazine  (PHENERGAN ) injection (IM or IVPB)       LOS: 5 days   Fredia Skeeter, MD [facility]  04/17/2023, 11:19 AM   *Please note that this is a verbal dictation therefore any spelling or grammatical errors are due to the Dragon Medical One system interpretation.  Please page via Amion and do not message via secure chat for urgent patient care matters. Secure chat can be used for non urgent patient care matters.  How to contact the [facility] Attending or Consulting provider 7A - 7P or covering provider during after hours 7P -7A, for this patient?  Check the care team in [facility] and look for a) attending/consulting TR[facility] provider listed and b) the [facility] team listed. Page or secure chat 7A-7P. Log into [email or web address] and use [place]'s universal password to access. If you do not have the password, please contact the hospital operator. Locate the [facility] provider you are looking for under [facility] and page to a number that you can be directly reached. If you still have difficulty reaching the provider, please page the [facility] (Director on Call) for the  Hospitalists listed on amion for assistance.

## 2023-04-18 DIAGNOSIS — I472 Ventricular tachycardia, unspecified: Secondary | ICD-10-CM | POA: Diagnosis not present

## 2023-04-18 MED ORDER — METOPROLOL SUCCINATE ER 25 MG PO TB24
12.5000 mg | ORAL_TABLET | Freq: Every day | ORAL | Status: DC
  Administered 2023-04-18 – 2023-04-22 (×5): 12.5 mg via ORAL
  Filled 2023-04-18 (×5): qty 1

## 2023-04-18 MED ORDER — ENSURE ENLIVE PO LIQD
237.0000 mL | Freq: Two times a day (BID) | ORAL | Status: DC
  Administered 2023-04-18 – 2023-04-22 (×8): 237 mL via ORAL

## 2023-04-18 MED ORDER — QUINIDINE SULFATE 200 MG PO TABS
200.0000 mg | ORAL_TABLET | Freq: Three times a day (TID) | ORAL | Status: DC
  Administered 2023-04-18 – 2023-04-22 (×12): 200 mg via ORAL
  Filled 2023-04-18 (×15): qty 1

## 2023-04-18 NOTE — Progress Notes (Addendum)
 Reviewed with [facility]. Ok to return to lasix 20mg  daily dosing at discharge.    Canary Brim, MSN, APRN, NP-C, AGACNP-BC [facility] - Electrophysiology  04/18/2023, 1:33 PM

## 2023-04-18 NOTE — Progress Notes (Signed)
 Physical Therapy Treatment Patient Details Name: [name] MRN: [id number] DOB: 1933-03-18 Today's Date: 04/18/2023   History of Present Illness 89 y.o. male admitted 04/12/23 with nausea and fatigue after recent adjustments to medication for VT storm. PMHx: permanent AFib, HFrEF, VT, CAD s/p CABG, AVR, ICM s/p CRT-D, CKD, hyperthyroidism, HTN, HLD    PT Comments  Pt pleasant and wanting to get up to walk. Each standing trial pt limited by knee buckling and unable to progress to gait or stepping away from surface. Pt also with posterior LOB x 2 in sitting with assist for stability, balance and safety. Pt educated for seated HEP and use of stedy with staff if needed when knees buckling. Will continue to follow. With current level of mobility Patient will benefit from continued inpatient follow up therapy, <3 hours/day  134/116 (123) sitting EOB 84/55 (65) after pivot to chair HR 97 SpO2 94% RA    If plan is discharge home, recommend the following: A little help with bathing/dressing/bathroom;Assist for transportation;Help with stairs or ramp for entrance;Assistance with cooking/housework;A lot of help with walking and/or transfers   Can travel by private vehicle     No  Equipment Recommendations  None recommended by PT    Recommendations for Other Services       Precautions / Restrictions Precautions Precautions: Fall Precaution Comments: watch BP, jerking and buckling knees     Mobility  Bed Mobility Overal bed mobility: Needs Assistance Bed Mobility: Supine to Sit     Supine to sit: Used rails, HOB elevated, Contact guard     General bed mobility comments: HOB 20 degrees, CGA with rail to rise, posterior LOB with initial sitting requiring min assist to correct and recover, cues to scoot to edge of surface    Transfers Overall transfer level: Needs assistance   Transfers: Sit to/from Stand Sit to Stand: Min assist Stand pivot transfers: Min assist          General transfer comment: min assist to stand x 4 trials with pt limited by knee buckling each trial, use of RW in standing and stand pivot bed to chair with right knee blocked    Ambulation/Gait               General Gait Details: unable   Stairs             Wheelchair Mobility     Tilt Bed    Modified Rankin (Stroke Patients Only)       Balance Overall balance assessment: Needs assistance Sitting-balance support: No upper extremity supported, Feet supported Sitting balance-Leahy Scale: Fair Sitting balance - Comments: EOB with CGA due to posterior bias x 2   Standing balance support: Bilateral upper extremity supported, During functional activity, Reliant on assistive device for balance Standing balance-Leahy Scale: Poor Standing balance comment: reliant on BUE support and assist, knee buckling                            Cognition Arousal: Alert Behavior During Therapy: Flat affect Overall Cognitive Status: Within Functional Limits for tasks assessed                                          Exercises General Exercises - Lower Extremity Long Arc Quad: AROM, Both, Seated, 10 reps Hip Flexion/Marching: AROM, Both, Seated, 10 reps  General Comments        Pertinent Vitals/Pain Pain Assessment Pain Assessment: No/denies pain    Home Living                          Prior Function            PT Goals (current goals can now be found in the care plan section) Progress towards PT goals: Progressing toward goals    Frequency    Min 1X/week      PT Plan      Co-evaluation              AM-PAC PT 6 Clicks Mobility   Outcome Measure  Help needed turning from your back to your side while in a flat bed without using bedrails?: A Little Help needed moving from lying on your back to sitting on the side of a flat bed without using bedrails?: A Little Help needed moving to and from a bed to a  chair (including a wheelchair)?: A Lot Help needed standing up from a chair using your arms (e.g., wheelchair or bedside chair)?: A Little Help needed to walk in hospital room?: Total Help needed climbing 3-5 steps with a railing? : Total 6 Click Score: 13    End of Session Equipment Utilized During Treatment: Gait belt Activity Tolerance: Patient limited by fatigue Patient left: in chair;with call bell/phone within reach;with chair alarm set Nurse Communication: Mobility status PT Visit Diagnosis: Unsteadiness on feet (R26.81);Muscle weakness (generalized) (M62.81);Other abnormalities of gait and mobility (R26.89)     Time: 1041-1110 PT Time Calculation (min) (ACUTE ONLY): 29 min  Charges:    $Therapeutic Activity: 23-37 mins PT General Charges $$ ACUTE PT VISIT: 1 Visit                     [name] SQUIBB, PT Acute Rehabilitation Services Office: [phone]    [name] [name] 04/18/2023, 12:14 PM

## 2023-04-18 NOTE — Progress Notes (Signed)
 PROGRESS NOTE    [name]  FMW:[id number] DOB: Jan 20, 1933 DOA: 04/12/2023 PCP: [name] Senior, MD   Brief Narrative:  [name] is a 89 y.o. male with medical history significant for hypertension, hyperlipidemia, CAD, atrial fibrillation on Eliquis , AAS status post bioprosthetic AVR, HFrEF, and ventricular tachycardia who presented with nausea and fatigue.   Patient was recently admitted with VT storm requiring ATP.  Sotalol  was stopped due to ineffectiveness and he was transition to amiodarone  and had mexiletine increased.  He continued to have ATP despite reloading IV amiodarone , was not felt to be a candidate for invasive EP procedures, and was eventually discharged home per patient's wishes with the understanding that he would continue to have VT.    Since the discharge, he was experiencing significant nausea that was attributed to his medications and EP recommended decreasing his amiodarone .   Upon arrival to the ED, patient is found to be afebrile and saturating well on room air with normal heart rate and stable blood pressure.  Labs were most notable for potassium 5.5, creatinine 2.15, normal WBC, and troponin 26.   Patient admitted under hospital service, cardiology consulted.  Assessment & Plan:   Principal Problem:   Ventricular tachycardia (HCC) Active Problems:   Coronary artery disease   Persistent atrial fibrillation (HCC)   Stage 4 chronic kidney disease (HCC)   Hyperkalemia   Chronic combined systolic (congestive) and diastolic (congestive) heart failure (HCC)   Ventricular tachycardia, sustained (HCC)  1. Ventricular tachycardia  - Appreciate cardiology consultation, patient was initially on amiodarone  drip, eventually transitioned to mexiletine twice daily and quinidine 3 times daily/current dose.  Still having nonsustained VT, quinidine increased to 3 times daily dosage 04/16/2023, per cardiology, there are no more therapies other than the current regime  and they recommend focusing on comfort care and possibility of turning off ICD therapies.  Patient is still having VTs but very brief and under 100 rates.  Cardiology has recommended continuing current regime and signed off 04/18/2023.   2. CAD  -Asymptomatic, continue nitrate, he is not on any beta-blockers.   3. HFrEF  Appear dry yesterday, received some IV fluids day before yesterday by cardiology.  Now appears euvolemic, denies any shortness of breath.  Currently on IV Lasix  daily.  No recommendations regarding diuretics and cardiology's note, I have sent them a message to clarify.  Diuretics per cardiology.   4. Atrial fibrillation  Continue Eliquis .  Management per cardiology.   5. CKD 4; hyperkalemia  -Baseline creatinine around 2.4-2.5, SCr, much better now.  Mild hyperkalemia treated with Lokelma  in the ED and normalized now.   Hyperlipidemia: Continue atorvastatin .  Delirium: Resolved, was prompted by scopolamine  patch.  Treated with delirium precautions and Seroquel .  Seroquel  discontinued 04/17/2023 per daughter's recommendations.  Patient is fully alert and oriented today.  Deconditioning/generalized weakness: Up until yesterday, PT had recommended home health PT.  However per PT, today his legs are buckling and could barely get to a chair with RW and assist and thus they have updated their recommendations for SNF.  TOC has been consulted.  DVT prophylaxis: apixaban  (ELIQUIS ) tablet 2.5 mg Start: 04/13/23 0200   Code Status: Limited: Do not attempt resuscitation (DNR) -DNR-LIMITED -Do Not Intubate/DNI   Family Communication: None present at bedside.  Plan of care discussed with patient in length and he/she verbalized understanding and agreed with it.  Status is: Inpatient Remains inpatient appropriate because: Now cleared by cardiology, pending placement to SNF.  Estimated body  mass index is 26.07 kg/m as calculated from the following:   Height as of this encounter: 6' (1.829  m).   Weight as of this encounter: 87.2 kg.    Nutritional Assessment: Body mass index is 26.07 kg/m.[name] Seen by dietician.  I agree with the assessment and plan as outlined below: Nutrition Status:        . Skin Assessment: I have examined the patient's skin and I agree with the wound assessment as performed by the wound care RN as outlined below:    Consultants:  Cardiology  Procedures:  None  Antimicrobials:  Anti-infectives (From admission, onward)    None         Subjective: Patient seen and examined.  He has no complaints.  He is fully alert and oriented.  Objective: Vitals:   04/17/23 2318 04/18/23 0300 04/18/23 0820 04/18/23 1120  BP: 102/62 118/81 113/68 108/68  Pulse: 79 82 87 95  Resp: 14 20 16 19   Temp: 98.5 F (36.9 C) 98 F (36.7 C) 97.8 F (36.6 C) (!) 97.4 F (36.3 C)  TempSrc: Oral Oral Oral Oral  SpO2: 97% 98% 97% 94%  Weight:  87.2 kg    Height:        Intake/Output Summary (Last 24 hours) at 04/18/2023 1248 Last data filed at 04/18/2023 1145 Gross per 24 hour  Intake 240 ml  Output 1800 ml  Net -1560 ml   Filed Weights   04/16/23 0345 04/17/23 0633 04/18/23 0300  Weight: 87.5 kg 89.3 kg 87.2 kg    Examination:  General exam: Appears calm and comfortable  Respiratory system: Clear to auscultation. Respiratory effort normal. Cardiovascular system: S1 & S2 heard, RRR. No JVD, murmurs, rubs, gallops or clicks. No pedal edema. Gastrointestinal system: Abdomen is nondistended, soft and nontender. No organomegaly or masses felt. Normal bowel sounds heard. Central nervous system: Alert and oriented. No focal neurological deficits. Extremities: Symmetric 5 x 5 power. Skin: No rashes, lesions or ulcers.  Psychiatry: Judgement and insight appear normal. Mood & affect appropriate.    Data Reviewed: I have personally reviewed following labs and imaging studies  CBC: Recent Labs  Lab 04/12/23 1639 04/13/23 0238 04/14/23 0301  04/15/23 0232 04/16/23 0318  WBC 9.4 10.5 13.1* 9.7 10.5  NEUTROABS 6.4  --   --   --   --   HGB 14.7 13.1 13.2 12.5* 12.1*  HCT 45.7 41.2 40.9 37.0* 36.7*  MCV 93.5 94.5 91.1 89.6 90.2  PLT 292 282 313 277 250   Basic Metabolic Panel: Recent Labs  Lab 04/12/23 1729 04/13/23 0238 04/14/23 0304 04/15/23 0232 04/16/23 0318 04/17/23 0344  NA 136 136 133* 133* 133* 133*  K 5.5* 3.9 3.7 3.9 4.1 4.4  CL 103 100 96* 101 100 101  CO2 24 22 22  21* 21* 22  GLUCOSE 116* 163* 129* 109* 113* 116*  BUN 26* 24* 25* 23 27* 33*  CREATININE 2.15* 2.24* 2.27* 1.94* 2.00* 2.19*  CALCIUM  8.2* 8.4* 8.7* 8.3* 8.2* 8.0*  MG 2.4  --   --   --   --   --    GFR: Estimated Creatinine Clearance: 25.6 mL/min (A) (by C-G formula based on SCr of 2.19 mg/dL (H)). Liver Function Tests: No results for input(s): AST, ALT, ALKPHOS, BILITOT, PROT, ALBUMIN in the last 168 hours. No results for input(s): LIPASE, AMYLASE in the last 168 hours. No results for input(s): AMMONIA in the last 168 hours. Coagulation Profile: No results for input(s): INR,  PROTIME in the last 168 hours. Cardiac Enzymes: No results for input(s): CKTOTAL, CKMB, CKMBINDEX, TROPONINI in the last 168 hours. BNP (last 3 results) No results for input(s): PROBNP in the last 8760 hours. HbA1C: No results for input(s): HGBA1C in the last 72 hours. CBG: Recent Labs  Lab 04/12/23 1659  GLUCAP 123*   Lipid Profile: No results for input(s): CHOL, HDL, LDLCALC, TRIG, CHOLHDL, LDLDIRECT in the last 72 hours. Thyroid  Function Tests: Recent Labs    04/16/23 0318  TSH 3.394   Anemia Panel: No results for input(s): VITAMINB12, FOLATE, FERRITIN, TIBC, IRON, RETICCTPCT in the last 72 hours. Sepsis Labs: Recent Labs  Lab 04/16/23 0008  LATICACIDVEN 1.4    No results found for this or any previous visit (from the past 240 hours).   Radiology Studies: No results  found.   Scheduled Meds:  apixaban   2.5 mg Oral BID   atorvastatin   40 mg Oral QPM   famotidine   20 mg Oral QPM   feeding supplement  237 mL Oral BID BM   furosemide   40 mg Intravenous Daily   isosorbide  mononitrate  30 mg Oral Daily   metoprolol  succinate  12.5 mg Oral Daily   mexiletine  250 mg Oral Q12H   pantoprazole   40 mg Oral QAC breakfast   polyethylene glycol  17 g Oral Daily   sodium chloride  flush  3 mL Intravenous Q12H   Continuous Infusions:  promethazine  (PHENERGAN ) injection (IM or IVPB)       LOS: 6 days   Fredia Skeeter, MD [facility]  04/18/2023, 12:48 PM   *Please note that this is a verbal dictation therefore any spelling or grammatical errors are due to the Dragon Medical One system interpretation.  Please page via Amion and do not message via secure chat for urgent patient care matters. Secure chat can be used for non urgent patient care matters.  How to contact the [facility] Attending or Consulting provider 7A - 7P or covering provider during after hours 7P -7A, for this patient?  Check the care team in [facility] and look for a) attending/consulting [facility] provider listed and b) the [facility] team listed. Page or secure chat 7A-7P. Log into [email or web address] and use [place]'s universal password to access. If you do not have the password, please contact the hospital operator. Locate the [facility] provider you are looking for under [facility] and page to a number that you can be directly reached. If you still have difficulty reaching the provider, please page the [facility] (Director on Call) for the Hospitalists listed on amion for assistance.

## 2023-04-18 NOTE — TOC Progression Note (Signed)
 Transition of Care [facility]) - Progression Note    Patient Details  Name: [name] MRN: [id number] Date of Birth: 1933-04-07  Transition of Care [facility]) CM/SW Contact  [name], [place] Phone Number: 04/18/2023, 1:24 PM  Clinical Narrative:      CSW met with pt bedside to discuss PT rec for SNF. Pt is agreeable to SNF work up. He states he has been to a SNF in the past but doesn't remember the details of it. He would be interested in something close to his home in [place]. Pt consents to CSW contacting his daughter. Fl2 completed and bed requests sent in hub.   CSW called pt's daughter and updated on SNF rec and SNF workup process. She is interested in [facility] which is in [place]. CSW also provides her with medicare.gov info and instructions to look up SNFs  with their star ratings.   Expected Discharge Plan: Skilled Nursing Facility Barriers to Discharge: Continued Medical Work up, English As A Second Language Teacher, SNF Pending bed offer  Expected Discharge Plan and Services     Post Acute Care Choice: Skilled Nursing Facility Living arrangements for the past 2 months: Single Family Home                                       Social Determinants of Health (SDOH) Interventions SDOH Screenings   Food Insecurity: Unknown (04/13/2023)  Housing: Unknown (04/13/2023)  Transportation Needs: No Transportation Needs (03/19/2023)  Utilities: Not At Risk (03/19/2023)  Tobacco Use: Low Risk  (04/13/2023)    Readmission Risk Interventions     No data to display

## 2023-04-18 NOTE — Progress Notes (Addendum)
 Patient Name: [name] Date of Encounter: 04/18/2023  Primary Cardiologist: Wilbert Bihari, MD Electrophysiologist: Soyla Gladis Norton, MD  Interval Summary   The patient is doing well today.  Family reports he seems more alert. They indicate he has decided to keep his ICD therapies in place (on).   At this time, the patient denies chest pain, shortness of breath, or any new concerns.  Vital Signs    Vitals:   04/17/23 1558 04/17/23 2007 04/17/23 2318 04/18/23 0300  BP: 119/73 118/72 102/62 118/81  Pulse: 91 87 79 82  Resp: 18 20 14 20   Temp: 97.7 F (36.5 C) 98 F (36.7 C) 98.5 F (36.9 C) 98 F (36.7 C)  TempSrc: Oral Oral Oral Oral  SpO2: 98% 98% 97% 98%  Weight:    87.2 kg  Height:        Intake/Output Summary (Last 24 hours) at 04/18/2023 0727 Last data filed at 04/18/2023 0600 Gross per 24 hour  Intake 240 ml  Output 1000 ml  Net -760 ml   Filed Weights   04/16/23 0345 04/17/23 0633 04/18/23 0300  Weight: 87.5 kg 89.3 kg 87.2 kg    Physical Exam    GEN- The patient is well appearing, alert and oriented x 3 today.   Lungs- Clear to ausculation bilaterally, normal work of breathing Cardiac- Regular rate and rhythm / VP at baseline with frequent slow NSVT, no murmurs, rubs or gallops GI- soft, NT, ND, + BS Extremities- no clubbing or cyanosis. No edema  Telemetry    VP 80-100's, with intermittent NSVT (personally reviewed)  Hospital Course    MASUD HOLUB is a 89 y.o. male with PMH of permanent AF, sCHF s/p ICD, VT, CAD s/p CABG with AVR admitted 04/13/23 for VT storm. Recent prolonged hospitalization for VT storm and he was restarted on amiodarone  > slowed VT but continued to have recurrent bouts treated with ATP. His VT slowed to the 120's and VT zones were adjusted. He was discharged on PO amio but outpatient course was complicated by nausea/vomiting and progressive weakness.   Assessment & Plan    VT Storm  ICM s/p CABG & AVR (remote) Chronic  Systolic CHF  -continue mexiletine 250 mg BID  -continue quinidine 200 mg TID  -pt elects to keep ICD therapies on at this time -unfortunately, limited further options to control VT -plan for EP follow up in 2 weeks -DNR / DNI in the event of arrest -euvolemic  Nausea / Vomiting  Confusion with scopolamine  > stopped 1/5 -improved   Permanent AF  -OAC for stroke prophylaxis  CAD  -no anginal symptoms    Secondary Hypercoagulable State  -Eliquis  2.5mg  BID, appropriate dose    Confusion  Thought secondary to scopolamine  vs seroquel    -significantly improved  -encourage PT/OT efforts    EP Rebekka Lobello sign off. Please call back if new needs arise. Anticipate he is nearing discharge.    For questions or updates, please contact [facility] Please consult [email or web address] for contact info under Cardiology/STEMI.  Signed, Daphne Barrack, MSN, APRN, NP-C, AGACNP-BC [facility] - Electrophysiology  04/18/2023, 7:27 AM  I have seen and examined this patient with Daphne Barrack.  Agree with above, note added to reflect my findings.  Patient continuing to have episodes of ventricular tachycardia.  Feeling mildly more alert and with potentially less fatigue.  GEN: Well nourished, well developed, in no acute distress  HEENT: normal  Neck: no JVD, carotid bruits, or masses Cardiac:  Irregular; no murmurs, rubs, or gallops,no edema  Respiratory:  clear to auscultation bilaterally, normal work of breathing GI: soft, nontender, nondistended, + BS MS: no deformity or atrophy  Skin: warm and dry, device site well healed Neuro:  Strength and sensation are intact Psych: euthymic mood, full affect   VT storm: Currently on mexiletine and quinidine.  He is continue to have episodes of ventricular tachycardia, though rates have remained around 100 bpm.  No plans to change ICD settings.  Patient is DNR/DNI. Permanent atrial fibrillation: Continue anticoagulation Coronary artery disease: No  current chest pain.  No plans for ischemic evaluation Chronic systolic heart failure: No volume overload  EP to sign off.  Continue medications as above at discharge.  Anaiyah Anglemyer arrange for follow-up in EP clinic.  Sammie Schermerhorn M. Johncharles Fusselman MD 04/18/2023 11:53 AM

## 2023-04-18 NOTE — NC FL2 (Signed)
 [place]  MEDICAID FL2 LEVEL OF CARE FORM     IDENTIFICATION  Patient Name: [name] Birthdate: May 23, 1932 Sex: male Admission Date (Current Location): 04/12/2023  [facility] and [place] Number:  Producer, Television/film/video and Address:  The . [facility], [address] [place], [place] [address]      Provider Number: [id number]  Attending Physician Name and Address:  Vernon Ranks, MD  Relative Name and Phone Number:  [name], [name] (Daughter)  [phone] (Mobile)    Current Level of Care: Hospital Recommended Level of Care: Skilled Nursing Facility Prior Approval Number:    Date Approved/Denied:   PASRR Number: [id number]  Discharge Plan: SNF    Current Diagnoses: Patient Active Problem List   Diagnosis Date Noted   Ventricular tachycardia (HCC) 04/12/2023   Hypercoagulable state due to persistent atrial fibrillation (HCC) 07/19/2022   Orthostatic hypotension 09/11/2019   Ileus (HCC) 09/04/2019   Hyponatremia 09/03/2019   Metabolic acidosis 09/03/2019   Diverticulitis 09/02/2019   Acute urinary retention 09/02/2019   Lower extremity weakness 11/29/2018   Multiple thyroid  nodules 08/13/2017   DCM (dilated cardiomyopathy) (HCC) 06/16/2017   History of aortic valve replacement with bioprosthetic valve    Cardiac resynchronization therapy defibrillator (CRT-D) in place    Protein-calorie malnutrition, severe 05/27/2017   Hyperthyroidism 05/26/2017   Cough 05/26/2017   Pleural effusion on right 05/26/2017   Malnutrition of moderate degree 05/09/2017   GI bleed 05/08/2017   Ventricular tachycardia, sustained (HCC) 05/06/2017   Dizziness 04/23/2017   Weight loss 04/23/2017   Leg weakness 01/28/2017   Chronic combined systolic (congestive) and diastolic (congestive) heart failure (HCC)    Shingles 08/23/2014   Stage 4 chronic kidney disease (HCC) 08/23/2014   Hyperkalemia 08/23/2014   Essential hypertension 01/23/2013   Dyslipidemia 01/23/2013    Coronary artery disease 01/23/2013   Persistent atrial fibrillation (HCC) 01/23/2013   Chronic anticoagulation 01/23/2013    Orientation RESPIRATION BLADDER Height & Weight     Self, Time, Situation, Place  Normal Incontinent, External catheter Weight: 192 lb 3.9 oz (87.2 kg) Height:  6' (182.9 cm)  BEHAVIORAL SYMPTOMS/MOOD NEUROLOGICAL BOWEL NUTRITION STATUS      Continent Diet (see d/c summary)  AMBULATORY STATUS COMMUNICATION OF NEEDS Skin   Extensive Assist Verbally Normal                       Personal Care Assistance Level of Assistance  Bathing, Feeding, Dressing Bathing Assistance: Maximum assistance Feeding assistance: Independent Dressing Assistance: Maximum assistance     Functional Limitations Info  Sight, Hearing, Speech Sight Info: Impaired Hearing Info: Impaired Speech Info: Adequate    SPECIAL CARE FACTORS FREQUENCY  OT (By licensed OT), PT (By licensed PT)     PT Frequency: 5x/week OT Frequency: 5x/week            Contractures Contractures Info: Not present    Additional Factors Info  Code Status, Allergies Code Status Info: DNR-Limited Allergies Info: Ranolazine , procaine, Amiodarone            Current Medications (04/18/2023):  This is the current hospital active medication list Current Facility-Administered Medications  Medication Dose Route Frequency Provider Last Rate Last Admin   acetaminophen  (TYLENOL ) tablet 650 mg  650 mg Oral Q6H PRN Opyd, Timothy S, MD   650 mg at 04/15/23 2038   Or   acetaminophen  (TYLENOL ) suppository 650 mg  650 mg Rectal Q6H PRN Opyd, Evalene RAMAN, MD  albuterol  (PROVENTIL ) (2.5 MG/3ML) 0.083% nebulizer solution 2.5 mg  2.5 mg Inhalation Q6H PRN Opyd, Timothy S, MD   2.5 mg at 04/15/23 1400   apixaban  (ELIQUIS ) tablet 2.5 mg  2.5 mg Oral BID Opyd, Timothy S, MD   2.5 mg at 04/18/23 0903   atorvastatin  (LIPITOR) tablet 40 mg  40 mg Oral QPM Opyd, Timothy S, MD   40 mg at 04/17/23 1708   famotidine   (PEPCID ) tablet 20 mg  20 mg Oral QPM Opyd, Timothy S, MD   20 mg at 04/17/23 1708   feeding supplement (ENSURE ENLIVE / ENSURE PLUS) liquid 237 mL  237 mL Oral BID BM Vernon Ranks, MD   237 mL at 04/18/23 0904   furosemide  (LASIX ) injection 40 mg  40 mg Intravenous Daily Ollis, Brandi L, NP   40 mg at 04/18/23 9096   isosorbide  mononitrate (IMDUR ) 24 hr tablet 30 mg  30 mg Oral Daily Opyd, Timothy S, MD   30 mg at 04/18/23 9095   metoprolol  succinate (TOPROL -XL) 24 hr tablet 12.5 mg  12.5 mg Oral Daily Ollis, Brandi L, NP   12.5 mg at 04/18/23 1305   mexiletine (MEXITIL ) capsule 250 mg  250 mg Oral Q12H Ollis, Brandi L, NP   250 mg at 04/18/23 0910   ondansetron  (ZOFRAN ) injection 4 mg  4 mg Intravenous Q6H PRN Pahwani, Ravi, MD       pantoprazole  (PROTONIX ) EC tablet 40 mg  40 mg Oral QAC breakfast Opyd, Timothy S, MD   40 mg at 04/18/23 0903   polyethylene glycol (MIRALAX  / GLYCOLAX ) packet 17 g  17 g Oral Daily Pahwani, Ravi, MD   17 g at 04/15/23 1527   promethazine  (PHENERGAN ) 12.5 mg in sodium chloride  0.9 % 50 mL IVPB  12.5 mg Intravenous Q6H PRN Pahwani, Ranks, MD       senna-docusate (Senokot-S) tablet 1 tablet  1 tablet Oral QHS PRN Opyd, Timothy S, MD       sodium chloride  flush (NS) 0.9 % injection 3 mL  3 mL Intravenous Q12H Opyd, Timothy S, MD   3 mL at 04/18/23 9094     Discharge Medications: Please see discharge summary for a list of discharge medications.  Relevant Imaging Results:  Relevant Lab Results:   Additional Information SSN [id number]  [name], LCSW

## 2023-04-19 DIAGNOSIS — I472 Ventricular tachycardia, unspecified: Secondary | ICD-10-CM | POA: Diagnosis not present

## 2023-04-19 MED ORDER — ENSURE ENLIVE PO LIQD: 237.0000 mL | Freq: Two times a day (BID) | ORAL | 12 refills | Status: DC

## 2023-04-19 MED ORDER — METOPROLOL SUCCINATE ER 25 MG PO TB24: 12.5000 mg | ORAL_TABLET | Freq: Every day | ORAL | 0 refills | Status: DC

## 2023-04-19 MED ORDER — SENNOSIDES-DOCUSATE SODIUM 8.6-50 MG PO TABS: 1.0000 | ORAL_TABLET | Freq: Every evening | ORAL | 0 refills | Status: DC | PRN

## 2023-04-19 MED ORDER — QUINIDINE SULFATE 200 MG PO TABS: 200.0000 mg | ORAL_TABLET | Freq: Three times a day (TID) | ORAL | 0 refills | Status: DC

## 2023-04-19 MED ORDER — POLYETHYLENE GLYCOL 3350 17 G PO PACK: 17.0000 g | PACK | Freq: Every day | ORAL | 0 refills | Status: DC

## 2023-04-19 MED ORDER — FUROSEMIDE 20 MG PO TABS: 20.0000 mg | ORAL_TABLET | Freq: Every day | ORAL | 11 refills | Status: DC

## 2023-04-19 MED ORDER — MEXILETINE HCL 250 MG PO CAPS: 250.0000 mg | ORAL_CAPSULE | Freq: Two times a day (BID) | ORAL | 1 refills | Status: DC

## 2023-04-19 NOTE — Progress Notes (Signed)
 PROGRESS NOTE    [name]  FMW:[id number] DOB: [birth year] DOA: 04/12/2023 PCP: Nanci Senior, MD   Brief Narrative:  Patient is an 89 year old Caucasian male, with past medical history significant for hypertension, hyperlipidemia, CAD, atrial fibrillation on Eliquis , AAS status post bioprosthetic AVR, HFrEF, ventricular tachycardia, with recent inpatient admission significant for VT storm requiring ATP.  Patient was discharged on amiodarone  and mexiletine.  On discharge, patient developed nausea and fatigue.  Patient was admitted and managed supportively for nausea and fatigue.  Amiodarone  has been discontinued.  Patient be discharged on quinidine.  Nausea has resolved.  Electrophysiology, cardiology and palliative care team assisted with patient's care during current hospital stay.  Patient has been optimized and will be discharged to skilled nursing facility.   Assessment & Plan:   Principal Problem:   Ventricular tachycardia (HCC) Active Problems:   Coronary artery disease   Persistent atrial fibrillation (HCC)   Stage 4 chronic kidney disease (HCC)   Hyperkalemia   Chronic combined systolic (congestive) and diastolic (congestive) heart failure (HCC)   Ventricular tachycardia, sustained (HCC)   1. Ventricular tachycardia  -Cardiology/EP team directed care. -Patient was initially on amiodarone  drip, eventually transitioned to mexiletine twice daily and quinidine 3 times daily. -Patient be discharged on quinidine. -Amiodarone  has been discontinued. -Patient will follow-up with cardiology and electrophysiology team on discharge.   2. CAD  -Asymptomatic, continue nitrate, he is not on any beta-blockers.   3. HFrEF  -Compensated.     4. Atrial fibrillation  Continue Eliquis .     5. CKD 4; hyperkalemia  -Resolved.   -Stable renal function.   Hyperlipidemia: Continue atorvastatin .   Delirium:  -Resolved. -Treated with delirium precautions and Seroquel . -Seroquel   was discontinued on 04/17/2023 per daughter's recommendations. -Patient is fully alert and oriented today.   Deconditioning/generalized weakness: -PT has recommended subacute rehab.       DVT prophylaxis: apixaban  (ELIQUIS ) tablet 2.5 mg Start: 04/13/23 0200   Code Status: Limited: Do not attempt resuscitation (DNR) -DNR-LIMITED -Do Not Intubate/DNI   Family Communication: SNF once a bed becomes available  Status is: Inpatient Remains inpatient appropriate because: Now cleared by cardiology, pending placement to SNF.  Estimated body mass index is 26.07 kg/m as calculated from the following:   Height as of this encounter: 6' (1.829 m).   Weight as of this encounter: 87.2 kg.    Nutritional Assessment: Body mass index is 26.07 kg/m.SABRA Seen by dietician.  I agree with the assessment and plan as outlined below: Nutrition Status:        . Skin Assessment: I have examined the patient's skin and I agree with the wound assessment as performed by the wound care RN as outlined below:    Consultants:  Cardiology  Procedures:  None  Antimicrobials:  Anti-infectives (From admission, onward)    None         Subjective: Patient seen and examined. No new complaints  Objective: Vitals:   04/19/23 0847 04/19/23 0849 04/19/23 1204 04/19/23 1516  BP: 129/80 129/80 105/61 111/61  Pulse: 91 91 85 78  Resp: 18  18 18   Temp:   97.8 F (36.6 C) 97.6 F (36.4 C)  TempSrc:   Oral Oral  SpO2:   94% 92%  Weight:      Height:        Intake/Output Summary (Last 24 hours) at 04/19/2023 1818 Last data filed at 04/19/2023 1515 Gross per 24 hour  Intake 720 ml  Output 1275 ml  Net -555 ml   Filed Weights   04/17/23 0633 04/18/23 0300 04/19/23 0414  Weight: 89.3 kg 87.2 kg 87.2 kg    Examination:  General exam: Appears calm and comfortable. Respiratory system: Clear to auscultation.  Cardiovascular system: S1 & S2 heard Gastrointestinal system: Abdomen is soft and  nontender.  Central nervous system: Awake and alert.    Extremities: No leg edema.  Data Reviewed: I have personally reviewed following labs and imaging studies  CBC: Recent Labs  Lab 04/13/23 0238 04/14/23 0301 04/15/23 0232 04/16/23 0318  WBC 10.5 13.1* 9.7 10.5  HGB 13.1 13.2 12.5* 12.1*  HCT 41.2 40.9 37.0* 36.7*  MCV 94.5 91.1 89.6 90.2  PLT 282 313 277 250   Basic Metabolic Panel: Recent Labs  Lab 04/13/23 0238 04/14/23 0304 04/15/23 0232 04/16/23 0318 04/17/23 0344  NA 136 133* 133* 133* 133*  K 3.9 3.7 3.9 4.1 4.4  CL 100 96* 101 100 101  CO2 22 22 21* 21* 22  GLUCOSE 163* 129* 109* 113* 116*  BUN 24* 25* 23 27* 33*  CREATININE 2.24* 2.27* 1.94* 2.00* 2.19*  CALCIUM  8.4* 8.7* 8.3* 8.2* 8.0*   GFR: Estimated Creatinine Clearance: 25.6 mL/min (A) (by C-G formula based on SCr of 2.19 mg/dL (H)). Liver Function Tests: No results for input(s): AST, ALT, ALKPHOS, BILITOT, PROT, ALBUMIN in the last 168 hours. No results for input(s): LIPASE, AMYLASE in the last 168 hours. No results for input(s): AMMONIA in the last 168 hours. Coagulation Profile: No results for input(s): INR, PROTIME in the last 168 hours. Cardiac Enzymes: No results for input(s): CKTOTAL, CKMB, CKMBINDEX, TROPONINI in the last 168 hours. BNP (last 3 results) No results for input(s): PROBNP in the last 8760 hours. HbA1C: No results for input(s): HGBA1C in the last 72 hours. CBG: No results for input(s): GLUCAP in the last 168 hours.  Lipid Profile: No results for input(s): CHOL, HDL, LDLCALC, TRIG, CHOLHDL, LDLDIRECT in the last 72 hours. Thyroid  Function Tests: No results for input(s): TSH, T4TOTAL, FREET4, T3FREE, THYROIDAB in the last 72 hours.  Anemia Panel: No results for input(s): VITAMINB12, FOLATE, FERRITIN, TIBC, IRON, RETICCTPCT in the last 72 hours. Sepsis Labs: Recent Labs  Lab 04/16/23 0008   LATICACIDVEN 1.4    No results found for this or any previous visit (from the past 240 hours).   Radiology Studies: No results found.   Scheduled Meds:  apixaban   2.5 mg Oral BID   atorvastatin   40 mg Oral QPM   famotidine   20 mg Oral QPM   feeding supplement  237 mL Oral BID BM   furosemide   40 mg Intravenous Daily   isosorbide  mononitrate  30 mg Oral Daily   metoprolol  succinate  12.5 mg Oral Daily   mexiletine  250 mg Oral Q12H   pantoprazole   40 mg Oral QAC breakfast   polyethylene glycol  17 g Oral Daily   quiNIDine sulfate   200 mg Oral TID   sodium chloride  flush  3 mL Intravenous Q12H   Continuous Infusions:  promethazine  (PHENERGAN ) injection (IM or IVPB)       LOS: 7 days   Leatrice LILLETTE Chapel, MD [facility]  04/19/2023, 6:18 PM   *Please note that this is a verbal dictation therefore any spelling or grammatical errors are due to the Dragon Medical One system interpretation.  Please page via Amion and do not message via secure chat for urgent patient care matters. Secure chat can be used for non urgent  patient care matters.  How to contact the [facility] Attending or Consulting provider 7A - 7P or covering provider during after hours 7P -7A, for this patient?  Check the care team in [facility] and look for a) attending/consulting [facility] provider listed and b) the [facility] team listed. Page or secure chat 7A-7P. Log into [email or web address] and use [place] universal password to access. If you do not have the password, please contact the hospital operator. Locate the [facility] provider you are looking for under [facility] and page to a number that you can be directly reached. If you still have difficulty reaching the provider, please page the [facility] (Director on Call) for the Hospitalists listed on amion for assistance.

## 2023-04-19 NOTE — Discharge Summary (Addendum)
 Physician Discharge Summary  Patient ID: [name] MRN: [id number] DOB/AGE: [birth year] 89 y.o.  Admit date: 04/12/2023 Discharge date: 04/19/2023  Admission Diagnoses:  Discharge Diagnoses:  Principal Problem:   Ventricular tachycardia [facility]) Active Problems:   Coronary artery disease   Persistent atrial fibrillation (HCC)   Stage 4 chronic kidney disease (HCC)   Hyperkalemia   Chronic combined systolic (congestive) and diastolic (congestive) heart failure (HCC)   Ventricular tachycardia, sustained (HCC)   Discharged Condition: stable  Hospital Course:  Patient is an 89 year old Caucasian male, with past medical history significant for hypertension, hyperlipidemia, CAD, atrial fibrillation on Eliquis , AAS status post bioprosthetic AVR, HFrEF, ventricular tachycardia, with recent inpatient admission significant for VT storm requiring ATP.  Patient was discharged on amiodarone  and mexiletine.  On discharge, patient developed nausea and fatigue.  Patient was admitted and managed supportively for nausea and fatigue.  Amiodarone  has been discontinued.  Patient be discharged on quinidine.  Nausea has resolved.  Electrophysiology, cardiology and palliative care team assisted with patient's care during current hospital stay.  Patient has been optimized and will be discharged to skilled nursing facility.   1. Ventricular tachycardia  -Cardiology/EP team directed care. -Patient was initially on amiodarone  drip, eventually transitioned to mexiletine twice daily and quinidine 3 times daily. -Patient be discharged on quinidine. -Amiodarone  has been discontinued. -Patient will follow-up with cardiology and electrophysiology team on discharge.   2. CAD  -Asymptomatic, continue nitrate, he is not on any beta-blockers.   3. HFrEF  -Compensated.     4. Atrial fibrillation  Continue Eliquis .     5. CKD 4; hyperkalemia  -Resolved.   -Stable renal function.   Hyperlipidemia: Continue  atorvastatin .   Delirium:  -Resolved. -Treated with delirium precautions and Seroquel . -Seroquel  was discontinued on 04/17/2023 per daughter's recommendations. -Patient is fully alert and oriented today.   Deconditioning/generalized weakness: -PT has recommended subacute rehab.    Consults: cardiology, palliative care team and electrophysiology team.  Significant Diagnostic Studies:  PORTABLE CHEST 1 VIEW   COMPARISON:  03/19/2023   FINDINGS: Left chest wall CRT D. Sternotomy and CABG. Stable cardiomediastinal silhouette. Aortic atherosclerotic calcification. No focal consolidation, pleural effusion, or pneumothorax. Scarring in the right mid lung. No displaced rib fractures.   IMPRESSION: No active disease.     Electronically Signed   By: Norman Gatlin M.D.   On: 04/12/2023 19:33  Treatments:  -Medication adjusted.  Discharge Exam: Blood pressure 111/61, pulse 78, temperature 97.6 F (36.4 C), temperature source Oral, resp. rate 18, height 6' (1.829 m), weight 87.2 kg, SpO2 92%.   Disposition: Discharge disposition: 03-Skilled Nursing Facility       Discharge Instructions     Diet - low sodium heart healthy   Complete by: As directed    Increase activity slowly   Complete by: As directed       Allergies as of 04/19/2023       Reactions   Ranolazine  Other (See Comments)   States he went into a coma and was in hospital for 3 days after taking   Procaine Swelling, Other (See Comments)   Amiodarone  Nausea And Vomiting, Other (See Comments)        Medication List     STOP taking these medications    amiodarone  200 MG tablet Commonly known as: PACERONE    amoxicillin  500 MG capsule Commonly known as: AMOXIL    carvedilol  25 MG tablet Commonly known as: COREG    Vascepa  1 g capsule Generic drug: icosapent  Ethyl  TAKE these medications    acetaminophen  500 MG tablet Commonly known as: TYLENOL  Take 500 mg by mouth as needed for  moderate pain (pain score 4-6), headache or mild pain (pain score 1-3).   albuterol  108 (90 Base) MCG/ACT inhaler Commonly known as: VENTOLIN  HFA Inhale 2 puffs into the lungs every 6 (six) hours as needed for wheezing or shortness of breath.   atorvastatin  40 MG tablet Commonly known as: LIPITOR Take 1 tablet (40 mg total) by mouth daily. What changed: when to take this   Eliquis  2.5 MG Tabs tablet Generic drug: apixaban  TAKE 1 TABLET TWICE DAILY   famotidine  40 MG tablet Commonly known as: PEPCID  Take 0.5 tablets (20 mg total) by mouth every evening.   feeding supplement Liqd Take 237 mLs by mouth 2 (two) times daily between meals.   furosemide  20 MG tablet Commonly known as: Lasix  Take 1 tablet (20 mg total) by mouth daily. What changed:  medication strength See the new instructions.   isosorbide  mononitrate 30 MG 24 hr tablet Commonly known as: IMDUR  Take 30 mg by mouth daily.   metoprolol  succinate 25 MG 24 hr tablet Commonly known as: TOPROL -XL Take 0.5 tablets (12.5 mg total) by mouth daily. Start taking on: April 20, 2023   mexiletine 250 MG capsule Commonly known as: MEXITIL  Take 1 capsule (250 mg total) by mouth every 12 (twelve) hours. What changed:  medication strength See the new instructions.   multivitamin with minerals Tabs tablet Take 1 tablet by mouth in the morning.   pantoprazole  40 MG tablet Commonly known as: PROTONIX  Take 40 mg by mouth daily before breakfast.   polyethylene glycol 17 g packet Commonly known as: MIRALAX  / GLYCOLAX  Take 17 g by mouth daily. Start taking on: April 20, 2023   quiNIDine sulfate  200 MG tablet Take 1 tablet (200 mg total) by mouth 3 (three) times daily.   scopolamine  1 MG/3DAYS Commonly known as: TRANSDERM-SCOP Place 1 patch (1.5 mg total) onto the skin every 3 (three) days.   senna-docusate 8.6-50 MG tablet Commonly known as: Senokot-S Take 1 tablet by mouth at bedtime as needed for mild  constipation.   sodium bicarbonate  650 MG tablet Take 650 mg by mouth in the morning.   SYSTANE OP Place 1 drop into both eyes as needed (dry/irritated eyes.).        Contact information for after-discharge care     Destination     [facility] Preferred SNF .   Service: Skilled Nursing Contact information: [address] [place]  [id number] [phone]                     Time spent: 35 minutes.  SignedBETHA Leatrice LILLETTE Rosario 04/19/2023, 3:21 PM  Addendum 04/22/2023.  11 AM. Patient was seen and examined.  Medication reconciliation reviewed.  Patient with well-controlled symptoms today.  No changes in the discharge instructions and medication reconciliation needed.  Stable to transfer to a skilled level of care.

## 2023-04-19 NOTE — Plan of Care (Signed)

## 2023-04-19 NOTE — Plan of Care (Signed)

## 2023-04-19 NOTE — Progress Notes (Signed)
 Physical Therapy Treatment Patient Details Name: [name] MRN: [id number] DOB: 1932/09/15 Today's Date: 04/19/2023   History of Present Illness 89 y.o. male admitted 04/12/23 with nausea and fatigue after recent adjustments to medication for VT storm. PMHx: permanent AFib, HFrEF, VT, CAD s/p CABG, AVR, ICM s/p CRT-D, CKD, hyperthyroidism, HTN, HLD    PT Comments  PT pleasant, reports ICD shock this am, feels weak and reports having not eaten significantly for several days. Pt with increased weakness, increased assist with transfers and decreased activity tolerance this session. Pt performed supine HEP, did not have energy to perform exercises in sitting and only able to tolerate EOB 3 min. Pt encouraged to eat, sit up in bed and chair with assist as able and to perform HEP. Discussed all with RN. Patient will benefit from continued inpatient follow up therapy, <3 hours/day  HR 85-89 SPO2 97% on RA 129/80 (95)     If plan is discharge home, recommend the following: A little help with bathing/dressing/bathroom;Assist for transportation;Help with stairs or ramp for entrance;Assistance with cooking/housework;A lot of help with walking and/or transfers   Can travel by private vehicle     No  Equipment Recommendations  None recommended by PT    Recommendations for Other Services       Precautions / Restrictions Precautions Precautions: Fall Precaution Comments: watch BP, jerking and buckling knees     Mobility  Bed Mobility Overal bed mobility: Needs Assistance Bed Mobility: Rolling Rolling: Min assist Sidelying to sit: Mod assist   Sit to supine: Min assist   General bed mobility comments: min assist with cues for knee flexion and use of rail to roll, assist to elevate trunk and stabilize in sitting. Pt without warning for intiating return to supine after 3 min with assist to bring legs to surface    Transfers                   General transfer comment: pt denied  attempting    Ambulation/Gait                   Stairs             Wheelchair Mobility     Tilt Bed    Modified Rankin (Stroke Patients Only)       Balance Overall balance assessment: Needs assistance Sitting-balance support: No upper extremity supported, Feet supported Sitting balance-Leahy Scale: Fair Sitting balance - Comments: EOB with CGA                                    Cognition Arousal: Alert Behavior During Therapy: Flat affect Overall Cognitive Status: Within Functional Limits for tasks assessed                                 General Comments: [facility]        Exercises Total Joint Exercises Straight Leg Raises: AAROM, Both, 15 reps, Supine General Exercises - Lower Extremity Heel Slides: AROM, Both, 15 reps, Supine    General Comments        Pertinent Vitals/Pain Pain Assessment Pain Assessment: No/denies pain    Home Living                          Prior Function  PT Goals (current goals can now be found in the care plan section) Progress towards PT goals: Not progressing toward goals - comment    Frequency    Min 1X/week      PT Plan      Co-evaluation              AM-PAC PT 6 Clicks Mobility   Outcome Measure  Help needed turning from your back to your side while in a flat bed without using bedrails?: A Little Help needed moving from lying on your back to sitting on the side of a flat bed without using bedrails?: A Lot Help needed moving to and from a bed to a chair (including a wheelchair)?: Total Help needed standing up from a chair using your arms (e.g., wheelchair or bedside chair)?: Total Help needed to walk in hospital room?: Total Help needed climbing 3-5 steps with a railing? : Total 6 Click Score: 9    End of Session   Activity Tolerance: Patient limited by fatigue Patient left: in bed;with call bell/phone within reach;with bed alarm  set Nurse Communication: Mobility status PT Visit Diagnosis: Unsteadiness on feet (R26.81);Muscle weakness (generalized) (M62.81);Other abnormalities of gait and mobility (R26.89)     Time: 9061-9041 PT Time Calculation (min) (ACUTE ONLY): 20 min  Charges:    $Therapeutic Activity: 8-22 mins PT General Charges $$ ACUTE PT VISIT: 1 Visit                     Lenoard SQUIBB, PT Acute Rehabilitation Services Office: [phone]    Lenoard NOVAK Laketta Soderberg 04/19/2023, 10:43 AM

## 2023-04-19 NOTE — TOC Progression Note (Signed)
 Transition of Care [facility]) - Progression Note    Patient Details  Name: [name] MRN: [id number] Date of Birth: 1933-03-16  Transition of Care [facility]) CM/SW Contact  Montie LOISE Louder, [place] Phone Number: 04/19/2023, 5:35 PM  Clinical Narrative:     Patient w/late discharge- [facility] states they cannot admit patient late due to the weather and their pharmacy closed. SNF reports can not admit patient until Monday.  Family, MD and Charge RN updated.   Montie Louder, MSW, LCSW Clinical Social Worker    Expected Discharge Plan: Skilled Nursing Facility Barriers to Discharge: Continued Medical Work up, English As A Second Language Teacher, SNF Pending bed offer  Expected Discharge Plan and Services     Post Acute Care Choice: Skilled Nursing Facility Living arrangements for the past 2 months: Single Family Home Expected Discharge Date: 04/19/23                                     Social Determinants of Health (SDOH) Interventions SDOH Screenings   Food Insecurity: Unknown (04/13/2023)  Housing: Unknown (04/13/2023)  Transportation Needs: No Transportation Needs (03/19/2023)  Utilities: Not At Risk (03/19/2023)  Tobacco Use: Low Risk  (04/13/2023)    Readmission Risk Interventions     No data to display

## 2023-04-19 NOTE — Progress Notes (Signed)
 Pt reported audible alarm from his device.  Interrogated at bedside > alarm was due to being away from home receiver and no remote transmission.  Battery good.  No acute device issues.     Daphne Barrack, MSN, APRN, NP-C, AGACNP-BC [facility] - Electrophysiology  04/19/2023, 7:53 PM

## 2023-04-19 NOTE — Progress Notes (Signed)
 Initial Nutrition Assessment  DOCUMENTATION CODES:   Non-severe (moderate) malnutrition in context of chronic illness  INTERVENTION:  Liberalize diet w/ assistance Ensure Enlive BID Magic Cup TID  NUTRITION DIAGNOSIS:   Moderate Malnutrition related to chronic illness as evidenced by moderate muscle depletion, moderate fat depletion.   GOAL:  Patient will meet greater than or equal to 90% of their needs   MONITOR:  PO intake, Supplement acceptance  REASON FOR ASSESSMENT:  Consult Assessment of nutrition requirement/status  ASSESSMENT:   Patient with PMH: HTN, HLD, CKD IV, CAD, A-fib, HFrEF, hyperthyroid, ventricular tachycardia. He was hospitalized 12/10-12/24 for generalized weakness r/t VT storm, at which time he was started on amiodarone . Endorses increased nausea since starting that medication and has become increasingly weak.   Consulted today for lack of appetite. Saw patient at bedside who reports he just doesn't feel like eating. Had not touched his breakfast meal. Nausea has resolved. Cannot endorse any food that sounds good to him. Will liberalize diet and add assistance with meal ordering. Ensure [place] continues. Had consumed approximately half of Ensure offering at bedside, but states he does not like them very much. Not amicable to trying a different supplement. Will add Magic Cup to meal trays.   Reiterated the importance that he consume adequate diet in regard to calories and protein to gain strength and work with therapy.  24-Hour Recall: B: Cornflakes w/ milk L: Sandwich, no sides D: Protein, starch, vegetable (wife cooks)  UBW around 212 pounds. Currently around 200 pounds. Cannot endorse how long ago he weighed that much.  Admit Weight: 88.3kg Current Weight: 87.2kg  Net IO Since Admission: -3,658.15 mL [04/19/23 1852]   Lives at home with his wife and ambulates using walker. Will discharge to SNF for short-term rehab on Monday  (01/13).  Labs: Na+ 133 BUN 33<--27<--23 Crt 2.19<--2.00<--1.94 CBGs 109-163mg /dL  Meds: Lokelma , IV lasix , famotidine , pantoprazole , Miralax ,   NUTRITION - FOCUSED PHYSICAL EXAM:  Flowsheet Row Most Recent Value  Orbital Region Mild depletion  Upper Arm Region Moderate depletion  Thoracic and Lumbar Region Mild depletion  Buccal Region Moderate depletion  Temple Region Moderate depletion  Clavicle Bone Region Moderate depletion  Clavicle and Acromion Bone Region Moderate depletion  Scapular Bone Region Moderate depletion  Dorsal Hand Moderate depletion  Patellar Region Moderate depletion  Anterior Thigh Region Moderate depletion  Posterior Calf Region Moderate depletion  Edema (RD Assessment) Mild  Hair Reviewed  Eyes Reviewed  Mouth Reviewed  Skin Reviewed  Nails Reviewed   Diet Order:   Diet Order             Diet regular Room service appropriate? Yes with Assist; Fluid consistency: Thin  Diet effective now           Diet - low sodium heart healthy             EDUCATION NEEDS:   Education needs have been addressed  Skin:  Skin Assessment: Reviewed RN Assessment  Last BM:  01/10  Height:  Ht Readings from Last 1 Encounters:  04/12/23 6' (1.829 m)   Weight:  Wt Readings from Last 1 Encounters:  04/19/23 87.2 kg    Ideal Body Weight:  80.9 kg  BMI:  Body mass index is 26.07 kg/m.  Estimated Nutritional Needs:   Kcal:  1700-1900kcal  Protein:  85-95g  Fluid:  1.7-1.9L/day  Blair Deaner MS, RD, LDN Registered Dietitian Clinical Nutrition RD Inpatient Contact Info in Amion

## 2023-04-20 DIAGNOSIS — I472 Ventricular tachycardia, unspecified: Secondary | ICD-10-CM | POA: Diagnosis not present

## 2023-04-20 NOTE — Progress Notes (Signed)
 PROGRESS NOTE    [name]  FMW:[id number] DOB: 1932/12/02 DOA: 04/12/2023 PCP: Nanci Senior, MD   Brief Narrative:  Patient is an 89 year old Caucasian male, with past medical history significant for hypertension, hyperlipidemia, CAD, atrial fibrillation on Eliquis , AAS status post bioprosthetic AVR, HFrEF, ventricular tachycardia, with recent inpatient admission significant for VT storm requiring ATP.  Patient was discharged on amiodarone  and mexiletine.  On discharge, patient developed nausea and fatigue.  Patient was admitted and managed supportively for nausea and fatigue.  Amiodarone  has been discontinued.  Patient be discharged on quinidine.  Nausea has resolved.  Electrophysiology, cardiology and palliative care team assisted with patient's care during current hospital stay.  Patient has been optimized and will be discharged to skilled nursing facility.   04/20/2023: Patient seen.  No new changes.  Awaiting discharge.   Assessment & Plan:   Principal Problem:   Ventricular tachycardia (HCC) Active Problems:   Coronary artery disease   Persistent atrial fibrillation (HCC)   Stage 4 chronic kidney disease (HCC)   Hyperkalemia   Chronic combined systolic (congestive) and diastolic (congestive) heart failure (HCC)   Ventricular tachycardia, sustained (HCC)   1. Ventricular tachycardia  -Cardiology/EP team directed care. -Patient was initially on amiodarone  drip, eventually transitioned to mexiletine twice daily and quinidine 3 times daily. -Patient be discharged on quinidine. -Amiodarone  has been discontinued. -Patient will follow-up with cardiology and electrophysiology team on discharge. 04/20/2023: Pursue disposition.   2. CAD  -Asymptomatic, continue nitrate, he is not on any beta-blockers.   3. HFrEF  -Compensated.     4. Atrial fibrillation  Continue Eliquis .     5. CKD 4; hyperkalemia  -Resolved.   -Stable renal function.   Hyperlipidemia: Continue  atorvastatin .   Delirium:  -Resolved. -Treated with delirium precautions and Seroquel . -Seroquel  was discontinued on 04/17/2023 per daughter's recommendations. -Patient is fully alert and oriented today.   Deconditioning/generalized weakness: -PT has recommended subacute rehab.    Moderate Malnutrition:     DVT prophylaxis: apixaban  (ELIQUIS ) tablet 2.5 mg Start: 04/13/23 0200   Code Status: Limited: Do not attempt resuscitation (DNR) -DNR-LIMITED -Do Not Intubate/DNI   Family Communication: SNF once a bed becomes available  Status is: Inpatient Remains inpatient appropriate because: Now cleared by cardiology, pending placement to SNF.  Estimated body mass index is 26.1 kg/m as calculated from the following:   Height as of this encounter: 6' (1.829 m).   Weight as of this encounter: 87.3 kg.    Nutritional Assessment: Body mass index is 26.1 kg/m.SABRA Seen by dietician.  I agree with the assessment and plan as outlined below: Nutrition Status: Nutrition Problem: Moderate Malnutrition Etiology: chronic illness Signs/Symptoms: moderate muscle depletion, moderate fat depletion Interventions: Ensure Enlive (each supplement provides 350kcal and 20 grams of protein), Magic cup, Liberalize Diet  . Skin Assessment: I have examined the patient's skin and I agree with the wound assessment as performed by the wound care RN as outlined below:    Consultants:  Cardiology  Procedures:  None  Antimicrobials:  Anti-infectives (From admission, onward)    None         Subjective: Patient seen and examined. No new complaints  Objective: Vitals:   04/20/23 0207 04/20/23 0335 04/20/23 0505 04/20/23 0740  BP:  109/72  125/68  Pulse: 90 91 81 78  Resp: 19 16 17 18   Temp:  97.9 F (36.6 C)  98.1 F (36.7 C)  TempSrc:  Oral  Oral  SpO2: 95% 97% 96% 97%  Weight:  87.3 kg    Height:        Intake/Output Summary (Last 24 hours) at 04/20/2023 1003 Last data filed at  04/19/2023 2000 Gross per 24 hour  Intake 720 ml  Output 900 ml  Net -180 ml   Filed Weights   04/18/23 0300 04/19/23 0414 04/20/23 0335  Weight: 87.2 kg 87.2 kg 87.3 kg    Examination:  General exam: Appears calm and comfortable. Respiratory system: Clear to auscultation.  Cardiovascular system: S1 & S2 heard Gastrointestinal system: Abdomen is soft and nontender.  Central nervous system: Awake and alert.    Extremities: No leg edema.  Data Reviewed: I have personally reviewed following labs and imaging studies  CBC: Recent Labs  Lab 04/14/23 0301 04/15/23 0232 04/16/23 0318  WBC 13.1* 9.7 10.5  HGB 13.2 12.5* 12.1*  HCT 40.9 37.0* 36.7*  MCV 91.1 89.6 90.2  PLT 313 277 250   Basic Metabolic Panel: Recent Labs  Lab 04/14/23 0304 04/15/23 0232 04/16/23 0318 04/17/23 0344  NA 133* 133* 133* 133*  K 3.7 3.9 4.1 4.4  CL 96* 101 100 101  CO2 22 21* 21* 22  GLUCOSE 129* 109* 113* 116*  BUN 25* 23 27* 33*  CREATININE 2.27* 1.94* 2.00* 2.19*  CALCIUM  8.7* 8.3* 8.2* 8.0*   GFR: Estimated Creatinine Clearance: 25.6 mL/min (A) (by C-G formula based on SCr of 2.19 mg/dL (H)). Liver Function Tests: No results for input(s): AST, ALT, ALKPHOS, BILITOT, PROT, ALBUMIN in the last 168 hours. No results for input(s): LIPASE, AMYLASE in the last 168 hours. No results for input(s): AMMONIA in the last 168 hours. Coagulation Profile: No results for input(s): INR, PROTIME in the last 168 hours. Cardiac Enzymes: No results for input(s): CKTOTAL, CKMB, CKMBINDEX, TROPONINI in the last 168 hours. BNP (last 3 results) No results for input(s): PROBNP in the last 8760 hours. HbA1C: No results for input(s): HGBA1C in the last 72 hours. CBG: No results for input(s): GLUCAP in the last 168 hours.  Lipid Profile: No results for input(s): CHOL, HDL, LDLCALC, TRIG, CHOLHDL, LDLDIRECT in the last 72 hours. Thyroid  Function Tests: No  results for input(s): TSH, T4TOTAL, FREET4, T3FREE, THYROIDAB in the last 72 hours.  Anemia Panel: No results for input(s): VITAMINB12, FOLATE, FERRITIN, TIBC, IRON, RETICCTPCT in the last 72 hours. Sepsis Labs: Recent Labs  Lab 04/16/23 0008  LATICACIDVEN 1.4    No results found for this or any previous visit (from the past 240 hours).   Radiology Studies: No results found.   Scheduled Meds:  apixaban   2.5 mg Oral BID   atorvastatin   40 mg Oral QPM   famotidine   20 mg Oral QPM   feeding supplement  237 mL Oral BID BM   furosemide   40 mg Intravenous Daily   isosorbide  mononitrate  30 mg Oral Daily   metoprolol  succinate  12.5 mg Oral Daily   mexiletine  250 mg Oral Q12H   pantoprazole   40 mg Oral QAC breakfast   polyethylene glycol  17 g Oral Daily   quiNIDine sulfate   200 mg Oral TID   sodium chloride  flush  3 mL Intravenous Q12H   Continuous Infusions:  promethazine  (PHENERGAN ) injection (IM or IVPB)       LOS: 8 days   Leatrice LILLETTE Chapel, MD [facility]  04/20/2023, 10:03 AM   *Please note that this is a verbal dictation therefore any spelling or grammatical errors are due to the Dragon Medical One system interpretation.  Please page via Amion and do not message via secure chat for urgent patient care matters. Secure chat can be used for non urgent patient care matters.  How to contact the [facility] Attending or Consulting provider 7A - 7P or covering provider during after hours 7P -7A, for this patient?  Check the care team in [facility] and look for a) attending/consulting [facility] provider listed and b) the [facility] team listed. Page or secure chat 7A-7P. Log into [email or web address] and use [facility] universal password to access. If you do not have the password, please contact the hospital operator. Locate the [facility] provider you are looking for under [facility] and page to a number that you can be directly reached. If you still have difficulty  reaching the provider, please page the [facility] (Director on Call) for the Hospitalists listed on amion for assistance.

## 2023-04-21 DIAGNOSIS — Z515 Encounter for palliative care: Secondary | ICD-10-CM | POA: Diagnosis not present

## 2023-04-21 DIAGNOSIS — I472 Ventricular tachycardia, unspecified: Secondary | ICD-10-CM | POA: Diagnosis not present

## 2023-04-21 DIAGNOSIS — Z7189 Other specified counseling: Secondary | ICD-10-CM | POA: Diagnosis not present

## 2023-04-21 NOTE — Progress Notes (Signed)
 PROGRESS NOTE    [name]  FMW:[id number] DOB: 1932-07-24 DOA: 04/12/2023 PCP: Nanci Senior, MD   Brief Narrative:  Patient is an 89 year old Caucasian male, with past medical history significant for hypertension, hyperlipidemia, CAD, atrial fibrillation on Eliquis , AAS status post bioprosthetic AVR, HFrEF, ventricular tachycardia, with recent inpatient admission significant for VT storm requiring ATP.  Patient was discharged on amiodarone  and mexiletine.  On discharge, patient developed nausea and fatigue.  Patient was admitted and managed supportively for nausea and fatigue.  Amiodarone  has been discontinued.  Patient be discharged on quinidine.  Nausea has resolved.  Electrophysiology, cardiology and palliative care team assisted with patient's care during current hospital stay.  Patient has been optimized and will be discharged to skilled nursing facility.   04/21/2023: Patient seen.  No new changes.  Awaiting discharge.   Assessment & Plan:   Principal Problem:   Ventricular tachycardia (HCC) Active Problems:   Coronary artery disease   Persistent atrial fibrillation (HCC)   Stage 4 chronic kidney disease (HCC)   Hyperkalemia   Chronic combined systolic (congestive) and diastolic (congestive) heart failure (HCC)   Ventricular tachycardia, sustained (HCC)   Moderate malnutrition (HCC)   1. Ventricular tachycardia  -Cardiology/EP team directed care. -Patient was initially on amiodarone  drip, eventually transitioned to mexiletine twice daily and quinidine 3 times daily. -Patient be discharged on quinidine. -Amiodarone  has been discontinued. -Patient will follow-up with cardiology and electrophysiology team on discharge. 04/20/2023: Pursue disposition.   2. CAD  -Asymptomatic, continue nitrate, he is not on any beta-blockers.   3. HFrEF  -Compensated.     4. Atrial fibrillation  Continue Eliquis .     5. CKD 4; hyperkalemia  -Resolved.   -Stable renal function.    Hyperlipidemia: Continue atorvastatin .   Delirium:  -Resolved. -Treated with delirium precautions and Seroquel . -Seroquel  was discontinued on 04/17/2023 per daughter's recommendations. -Patient is fully alert and oriented today.   Deconditioning/generalized weakness: -PT has recommended subacute rehab.    Moderate Malnutrition:     DVT prophylaxis: apixaban  (ELIQUIS ) tablet 2.5 mg Start: 04/13/23 0200   Code Status: Limited: Do not attempt resuscitation (DNR) -DNR-LIMITED -Do Not Intubate/DNI   Family Communication: SNF once a bed becomes available  Status is: Inpatient Remains inpatient appropriate because: Now cleared by cardiology, pending placement to SNF.  Estimated body mass index is 25.92 kg/m as calculated from the following:   Height as of this encounter: 6' (1.829 m).   Weight as of this encounter: 86.7 kg.    Nutritional Assessment: Body mass index is 25.92 kg/m.SABRA Seen by dietician.  I agree with the assessment and plan as outlined below: Nutrition Status: Nutrition Problem: Moderate Malnutrition Etiology: chronic illness Signs/Symptoms: moderate muscle depletion, moderate fat depletion Interventions: Ensure Enlive (each supplement provides 350kcal and 20 grams of protein), Magic cup, Liberalize Diet  . Skin Assessment: I have examined the patient's skin and I agree with the wound assessment as performed by the wound care RN as outlined below:    Consultants:  Cardiology  Procedures:  None  Antimicrobials:  Anti-infectives (From admission, onward)    None         Subjective: Patient seen and examined. No new complaints  Objective: Vitals:   04/20/23 1926 04/20/23 2308 04/21/23 0252 04/21/23 0731  BP: 119/67 108/66 110/67 134/73  Pulse: 80 77 75 81  Resp: 17 10 20 19   Temp: 97.8 F (36.6 C) 98.2 F (36.8 C) 97.7 F (36.5 C) 98 F (36.7 C)  TempSrc: Oral Oral Oral Oral  SpO2: 95% 97% 92% 94%  Weight:   86.7 kg   Height:         Intake/Output Summary (Last 24 hours) at 04/21/2023 1327 Last data filed at 04/20/2023 2205 Gross per 24 hour  Intake --  Output 1000 ml  Net -1000 ml   Filed Weights   04/19/23 0414 04/20/23 0335 04/21/23 0252  Weight: 87.2 kg 87.3 kg 86.7 kg    Examination:  General exam: Appears calm and comfortable. Respiratory system: Clear to auscultation.  Cardiovascular system: S1 & S2 heard Gastrointestinal system: Abdomen is soft and nontender.  Central nervous system: Awake and alert.    Extremities: No leg edema.  Data Reviewed: I have personally reviewed following labs and imaging studies  CBC: Recent Labs  Lab 04/15/23 0232 04/16/23 0318  WBC 9.7 10.5  HGB 12.5* 12.1*  HCT 37.0* 36.7*  MCV 89.6 90.2  PLT 277 250   Basic Metabolic Panel: Recent Labs  Lab 04/15/23 0232 04/16/23 0318 04/17/23 0344  NA 133* 133* 133*  K 3.9 4.1 4.4  CL 101 100 101  CO2 21* 21* 22  GLUCOSE 109* 113* 116*  BUN 23 27* 33*  CREATININE 1.94* 2.00* 2.19*  CALCIUM  8.3* 8.2* 8.0*   GFR: Estimated Creatinine Clearance: 25.6 mL/min (A) (by C-G formula based on SCr of 2.19 mg/dL (H)). Liver Function Tests: No results for input(s): AST, ALT, ALKPHOS, BILITOT, PROT, ALBUMIN in the last 168 hours. No results for input(s): LIPASE, AMYLASE in the last 168 hours. No results for input(s): AMMONIA in the last 168 hours. Coagulation Profile: No results for input(s): INR, PROTIME in the last 168 hours. Cardiac Enzymes: No results for input(s): CKTOTAL, CKMB, CKMBINDEX, TROPONINI in the last 168 hours. BNP (last 3 results) No results for input(s): PROBNP in the last 8760 hours. HbA1C: No results for input(s): HGBA1C in the last 72 hours. CBG: No results for input(s): GLUCAP in the last 168 hours.  Lipid Profile: No results for input(s): CHOL, HDL, LDLCALC, TRIG, CHOLHDL, LDLDIRECT in the last 72 hours. Thyroid  Function Tests: No results for  input(s): TSH, T4TOTAL, FREET4, T3FREE, THYROIDAB in the last 72 hours.  Anemia Panel: No results for input(s): VITAMINB12, FOLATE, FERRITIN, TIBC, IRON, RETICCTPCT in the last 72 hours. Sepsis Labs: Recent Labs  Lab 04/16/23 0008  LATICACIDVEN 1.4    No results found for this or any previous visit (from the past 240 hours).   Radiology Studies: No results found.   Scheduled Meds:  apixaban   2.5 mg Oral BID   atorvastatin   40 mg Oral QPM   famotidine   20 mg Oral QPM   feeding supplement  237 mL Oral BID BM   furosemide   40 mg Intravenous Daily   isosorbide  mononitrate  30 mg Oral Daily   metoprolol  succinate  12.5 mg Oral Daily   mexiletine  250 mg Oral Q12H   pantoprazole   40 mg Oral QAC breakfast   polyethylene glycol  17 g Oral Daily   quiNIDine sulfate   200 mg Oral TID   sodium chloride  flush  3 mL Intravenous Q12H   Continuous Infusions:  promethazine  (PHENERGAN ) injection (IM or IVPB)       LOS: 9 days   Leatrice LILLETTE Chapel, MD [facility]  04/21/2023, 1:27 PM   *Please note that this is a verbal dictation therefore any spelling or grammatical errors are due to the Dragon Medical One system interpretation.  Please page via Amion and  do not message via secure chat for urgent patient care matters. Secure chat can be used for non urgent patient care matters.  How to contact the [facility] Attending or Consulting provider 7A - 7P or covering provider during after hours 7P -7A, for this patient?  Check the care team in [facility] and look for a) attending/consulting [facility] provider listed and b) the [facility] team listed. Page or secure chat 7A-7P. Log into [email or web address] and use [facility]'s universal password to access. If you do not have the password, please contact the hospital operator. Locate the [facility] provider you are looking for under [facility] and page to a number that you can be directly reached. If you still have difficulty reaching the  provider, please page the [facility] (Director on Call) for the Hospitalists listed on amion for assistance.

## 2023-04-21 NOTE — Progress Notes (Signed)
 Mobility Specialist Progress Note:   04/21/23 1246  Mobility  Activity Transferred from chair to bed  Level of Assistance Minimal assist, patient does 75% or more  Assistive Device Front wheel walker  Distance Ambulated (ft) 3 ft  Activity Response Tolerated well  Mobility Referral Yes  Mobility visit 1 Mobility  Mobility Specialist Start Time (ACUTE ONLY) 1236  Mobility Specialist Stop Time (ACUTE ONLY) 1246  Mobility Specialist Time Calculation (min) (ACUTE ONLY) 10 min   Pre Mobility: 103/62 BP Post Mobility: 90 HR ,117/75 BP  Pt received in chair, requesting assistance back to bed d/t fatigue. Pt needing MinA to stand and pivot to bed d/t general weakness. Pt left in supine position asymptomatic with call bell in reach and all needs met.   [name]  Mobility Specialist Please contact via [employer] office at [phone]

## 2023-04-21 NOTE — Progress Notes (Signed)
 Mobility Specialist Progress Note:   04/21/23 1227  Mobility  Activity Transferred from bed to chair  Level of Assistance Contact guard assist, steadying assist  Assistive Device Front wheel walker  Distance Ambulated (ft) 3 ft  Activity Response Tolerated well  Mobility Referral Yes  Mobility visit 1 Mobility  Mobility Specialist Start Time (ACUTE ONLY) 1125  Mobility Specialist Stop Time (ACUTE ONLY) 1140  Mobility Specialist Time Calculation (min) (ACUTE ONLY) 15 min   Pre Mobility: 87 HR, 101/62 BP During Mobility:  84/48 BP  Post Mobility: 84 HR , 87/66 BP  Pt received in bed, agreeable to mobility. Pt c/o dizziness while transitioning to EOB. Orthostatic vitals taken. After recovery pt was able to stand and pivot to chair with CG. No unsteadiness or LOB present. Pt left in chair asymptomatic with call bell in reach and all needs met. RN notified.   [name]  Mobility Specialist Please contact via [employer] office at [phone]

## 2023-04-21 NOTE — Progress Notes (Signed)
   Palliative Medicine Inpatient Follow Up Note HPI: [name] is a 89 y.o. male with medical history significant for hypertension, hyperlipidemia, CAD, atrial fibrillation on Eliquis , AAS status post bioprosthetic AVR, HFrEF, and ventricular tachycardia who presented with nausea and fatigue. Palliative care saw [name] on his last admission.   Today's Discussion 04/21/2023  *Please note that this is a verbal dictation therefore any spelling or grammatical errors are due to the Dragon Medical One system interpretation.  Chart reviewed inclusive of vital signs, progress notes, laboratory results, and diagnostic images.   I spoke to [name] at bedside this morning. He is awake and alert. He shares that he is not in pain, short of breath, or nauseated this morning.  Patients NT is helping to set up for breakfast.  Reviewed the plan for skilled nursing as early as tomorrow.   Questions and concerns addressed/Palliative Support Provided.   Objective Assessment: Vital Signs Vitals:   04/21/23 0252 04/21/23 0731  BP: 110/67 134/73  Pulse: 75 81  Resp: 20 19  Temp: 97.7 F (36.5 C) 98 F (36.7 C)  SpO2: 92% 94%    Intake/Output Summary (Last 24 hours) at 04/21/2023 1035 Last data filed at 04/20/2023 2205 Gross per 24 hour  Intake --  Output 1000 ml  Net -1000 ml   Last Weight  Most recent update: 04/21/2023  2:56 AM    Weight  86.7 kg (191 lb 2.2 oz)            Gen:  Elderly Caucasian M in NAD HEENT: moist mucous membranes CV: Regular rate and irregular rhythm  PULM:  On RA, appears short of breath ABD: Not distended EXT: No edema  Neuro: Alert and oriented x3 - hard of hearing  SUMMARY OF RECOMMENDATIONS   DNAR/DNI --> Patient would like to keep AICD active for nonpainful and painful shocks at this tiime  Plan for OP Palliative support through [facility]  Possible discharge to SNF tomorrow  Billing based on MDM:  Low ______________________________________________________________________________________ [name] Palliative Medicine Team Team Cell Phone: [phone] Please utilize secure chat with additional questions, if there is no response within 30 minutes please call the above phone number  Palliative Medicine Team providers are available by phone from 7am to 7pm daily and can be reached through the team cell phone.  Should this patient require assistance outside of these hours, please call the patient's attending physician.

## 2023-04-22 ENCOUNTER — Encounter: Payer: Self-pay | Admitting: Cardiology

## 2023-04-22 DIAGNOSIS — Z961 Presence of intraocular lens: Secondary | ICD-10-CM | POA: Diagnosis present

## 2023-04-22 DIAGNOSIS — R531 Weakness: Secondary | ICD-10-CM | POA: Diagnosis not present

## 2023-04-22 DIAGNOSIS — R0902 Hypoxemia: Secondary | ICD-10-CM | POA: Diagnosis not present

## 2023-04-22 DIAGNOSIS — I5084 End stage heart failure: Secondary | ICD-10-CM | POA: Diagnosis not present

## 2023-04-22 DIAGNOSIS — R41841 Cognitive communication deficit: Secondary | ICD-10-CM | POA: Diagnosis not present

## 2023-04-22 DIAGNOSIS — T82897A Other specified complication of cardiac prosthetic devices, implants and grafts, initial encounter: Secondary | ICD-10-CM | POA: Diagnosis not present

## 2023-04-22 DIAGNOSIS — Z951 Presence of aortocoronary bypass graft: Secondary | ICD-10-CM | POA: Diagnosis not present

## 2023-04-22 DIAGNOSIS — R110 Nausea: Secondary | ICD-10-CM | POA: Diagnosis not present

## 2023-04-22 DIAGNOSIS — R918 Other nonspecific abnormal finding of lung field: Secondary | ICD-10-CM | POA: Diagnosis not present

## 2023-04-22 DIAGNOSIS — K5900 Constipation, unspecified: Secondary | ICD-10-CM | POA: Diagnosis not present

## 2023-04-22 DIAGNOSIS — I447 Left bundle-branch block, unspecified: Secondary | ICD-10-CM | POA: Diagnosis not present

## 2023-04-22 DIAGNOSIS — Z9842 Cataract extraction status, left eye: Secondary | ICD-10-CM | POA: Diagnosis not present

## 2023-04-22 DIAGNOSIS — Z515 Encounter for palliative care: Secondary | ICD-10-CM | POA: Diagnosis not present

## 2023-04-22 DIAGNOSIS — Z7189 Other specified counseling: Secondary | ICD-10-CM | POA: Diagnosis not present

## 2023-04-22 DIAGNOSIS — I472 Ventricular tachycardia, unspecified: Secondary | ICD-10-CM | POA: Diagnosis not present

## 2023-04-22 DIAGNOSIS — R109 Unspecified abdominal pain: Secondary | ICD-10-CM | POA: Diagnosis not present

## 2023-04-22 DIAGNOSIS — I4891 Unspecified atrial fibrillation: Secondary | ICD-10-CM | POA: Diagnosis not present

## 2023-04-22 DIAGNOSIS — M6281 Muscle weakness (generalized): Secondary | ICD-10-CM | POA: Diagnosis not present

## 2023-04-22 DIAGNOSIS — N189 Chronic kidney disease, unspecified: Secondary | ICD-10-CM | POA: Diagnosis not present

## 2023-04-22 DIAGNOSIS — Z953 Presence of xenogenic heart valve: Secondary | ICD-10-CM | POA: Diagnosis not present

## 2023-04-22 DIAGNOSIS — Y69 Unspecified misadventure during surgical and medical care: Secondary | ICD-10-CM | POA: Diagnosis not present

## 2023-04-22 DIAGNOSIS — R000 Tachycardia, unspecified: Secondary | ICD-10-CM | POA: Diagnosis not present

## 2023-04-22 DIAGNOSIS — E039 Hypothyroidism, unspecified: Secondary | ICD-10-CM | POA: Diagnosis not present

## 2023-04-22 DIAGNOSIS — I5042 Chronic combined systolic (congestive) and diastolic (congestive) heart failure: Secondary | ICD-10-CM | POA: Diagnosis not present

## 2023-04-22 DIAGNOSIS — Z7901 Long term (current) use of anticoagulants: Secondary | ICD-10-CM | POA: Diagnosis not present

## 2023-04-22 DIAGNOSIS — I499 Cardiac arrhythmia, unspecified: Secondary | ICD-10-CM | POA: Diagnosis not present

## 2023-04-22 DIAGNOSIS — R5381 Other malaise: Secondary | ICD-10-CM | POA: Diagnosis not present

## 2023-04-22 DIAGNOSIS — R945 Abnormal results of liver function studies: Secondary | ICD-10-CM | POA: Diagnosis not present

## 2023-04-22 DIAGNOSIS — R55 Syncope and collapse: Secondary | ICD-10-CM | POA: Diagnosis not present

## 2023-04-22 DIAGNOSIS — K219 Gastro-esophageal reflux disease without esophagitis: Secondary | ICD-10-CM | POA: Diagnosis present

## 2023-04-22 DIAGNOSIS — Z7401 Bed confinement status: Secondary | ICD-10-CM | POA: Diagnosis not present

## 2023-04-22 DIAGNOSIS — Z9841 Cataract extraction status, right eye: Secondary | ICD-10-CM | POA: Diagnosis not present

## 2023-04-22 DIAGNOSIS — I517 Cardiomegaly: Secondary | ICD-10-CM | POA: Diagnosis not present

## 2023-04-22 DIAGNOSIS — H04129 Dry eye syndrome of unspecified lacrimal gland: Secondary | ICD-10-CM | POA: Diagnosis not present

## 2023-04-22 DIAGNOSIS — I5020 Unspecified systolic (congestive) heart failure: Secondary | ICD-10-CM | POA: Diagnosis not present

## 2023-04-22 DIAGNOSIS — Z95810 Presence of automatic (implantable) cardiac defibrillator: Secondary | ICD-10-CM | POA: Diagnosis not present

## 2023-04-22 DIAGNOSIS — E871 Hypo-osmolality and hyponatremia: Secondary | ICD-10-CM | POA: Diagnosis not present

## 2023-04-22 DIAGNOSIS — I2510 Atherosclerotic heart disease of native coronary artery without angina pectoris: Secondary | ICD-10-CM | POA: Diagnosis not present

## 2023-04-22 DIAGNOSIS — Z8249 Family history of ischemic heart disease and other diseases of the circulatory system: Secondary | ICD-10-CM | POA: Diagnosis not present

## 2023-04-22 DIAGNOSIS — E785 Hyperlipidemia, unspecified: Secondary | ICD-10-CM | POA: Diagnosis not present

## 2023-04-22 DIAGNOSIS — E042 Nontoxic multinodular goiter: Secondary | ICD-10-CM | POA: Diagnosis not present

## 2023-04-22 DIAGNOSIS — Z66 Do not resuscitate: Secondary | ICD-10-CM | POA: Diagnosis not present

## 2023-04-22 DIAGNOSIS — I951 Orthostatic hypotension: Secondary | ICD-10-CM | POA: Diagnosis not present

## 2023-04-22 DIAGNOSIS — N184 Chronic kidney disease, stage 4 (severe): Secondary | ICD-10-CM | POA: Diagnosis not present

## 2023-04-22 DIAGNOSIS — I420 Dilated cardiomyopathy: Secondary | ICD-10-CM | POA: Diagnosis not present

## 2023-04-22 DIAGNOSIS — R1310 Dysphagia, unspecified: Secondary | ICD-10-CM | POA: Diagnosis not present

## 2023-04-22 DIAGNOSIS — I429 Cardiomyopathy, unspecified: Secondary | ICD-10-CM | POA: Diagnosis present

## 2023-04-22 DIAGNOSIS — I509 Heart failure, unspecified: Secondary | ICD-10-CM | POA: Diagnosis not present

## 2023-04-22 DIAGNOSIS — R7989 Other specified abnormal findings of blood chemistry: Secondary | ICD-10-CM | POA: Diagnosis not present

## 2023-04-22 DIAGNOSIS — R2681 Unsteadiness on feet: Secondary | ICD-10-CM | POA: Diagnosis not present

## 2023-04-22 DIAGNOSIS — I4811 Longstanding persistent atrial fibrillation: Secondary | ICD-10-CM | POA: Diagnosis not present

## 2023-04-22 DIAGNOSIS — I130 Hypertensive heart and chronic kidney disease with heart failure and stage 1 through stage 4 chronic kidney disease, or unspecified chronic kidney disease: Secondary | ICD-10-CM | POA: Diagnosis not present

## 2023-04-22 DIAGNOSIS — I10 Essential (primary) hypertension: Secondary | ICD-10-CM | POA: Diagnosis not present

## 2023-04-22 DIAGNOSIS — N179 Acute kidney failure, unspecified: Secondary | ICD-10-CM | POA: Diagnosis not present

## 2023-04-22 DIAGNOSIS — R262 Difficulty in walking, not elsewhere classified: Secondary | ICD-10-CM | POA: Diagnosis not present

## 2023-04-22 DIAGNOSIS — R059 Cough, unspecified: Secondary | ICD-10-CM | POA: Diagnosis not present

## 2023-04-22 NOTE — Progress Notes (Signed)
   Palliative Medicine Inpatient Follow Up Note HPI: [name] is a 89 y.o. male with medical history significant for hypertension, hyperlipidemia, CAD, atrial fibrillation on Eliquis , AAS status post bioprosthetic AVR, HFrEF, and ventricular tachycardia who presented with nausea and fatigue. Palliative care saw [name] on his last admission.   Today's Discussion 04/22/2023  *Please note that this is a verbal dictation therefore any spelling or grammatical errors are due to the Dragon Medical One system interpretation.  Chart reviewed inclusive of vital signs, progress notes, laboratory results, and diagnostic images.   I spoke to [name] at bedside this morning. We reviewed the plan for him to discharge today. He is in good spirits overall. [name] does share that he has had a poor appetite in the setting of intermittent nausea. Discussed the benefits of antiemetics and asking for these when needed.  Plan for transition to [facility] today  Questions and concerns addressed/Palliative Support Provided.   Objective Assessment: Vital Signs Vitals:   04/22/23 0304 04/22/23 0819  BP: (!) 106/59 123/73  Pulse: 75 86  Resp: 15 15  Temp: (!) 97.5 F (36.4 C) 98.3 F (36.8 C)  SpO2: 92% 97%    Intake/Output Summary (Last 24 hours) at 04/22/2023 1219 Last data filed at 04/22/2023 0305 Gross per 24 hour  Intake --  Output 1350 ml  Net -1350 ml   Last Weight  Most recent update: 04/22/2023  3:06 AM    Weight  86 kg (189 lb 9.5 oz)            Gen:  Elderly Caucasian M in NAD HEENT: moist mucous membranes CV: Regular rate and irregular rhythm  PULM:  On RA, appears short of breath ABD: Not distended EXT: No edema  Neuro: Alert and oriented x3 - hard of hearing  SUMMARY OF RECOMMENDATIONS   DNAR/DNI --> Patient would like to keep AICD active for nonpainful and painful shocks at this tiime  Plan for OP Palliative support through [facility]  Encouraged patient  to take phenergan  as needed  Possible discharge to SNF tomorrow  Billing based on MDM: Low ______________________________________________________________________________________ [name] Palliative Medicine Team Team Cell Phone: [phone] Please utilize secure chat with additional questions, if there is no response within 30 minutes please call the above phone number  Palliative Medicine Team providers are available by phone from 7am to 7pm daily and can be reached through the team cell phone.  Should this patient require assistance outside of these hours, please call the patient's attending physician.

## 2023-04-22 NOTE — Discharge Summary (Signed)
 Physician Discharge Summary  Patient ID: [name] MRN: [id number] DOB/AGE: June 19, 1932 89 y.o.  Admit date: 04/12/2023 Discharge date: 04/22/2023  Admission Diagnoses:  Discharge Diagnoses:  Principal Problem:   Ventricular tachycardia [facility]) Active Problems:   Coronary artery disease   Persistent atrial fibrillation (HCC)   Stage 4 chronic kidney disease (HCC)   Hyperkalemia   Chronic combined systolic (congestive) and diastolic (congestive) heart failure (HCC)   Ventricular tachycardia, sustained (HCC)   Moderate malnutrition (HCC)   Discharged Condition: stable  Hospital Course:  Patient is an 89 year old Caucasian male, with past medical history significant for hypertension, hyperlipidemia, CAD, atrial fibrillation on Eliquis , AAS status post bioprosthetic AVR, HFrEF, ventricular tachycardia, with recent inpatient admission significant for VT storm requiring ATP.  Patient was discharged on amiodarone  and mexiletine.  On discharge, patient developed nausea and fatigue.  Patient was admitted and managed supportively for nausea and fatigue.  Amiodarone  has been discontinued.  Patient be discharged on quinidine.  Nausea has resolved.  Electrophysiology, cardiology and palliative care team assisted with patient's care during current hospital stay.  Patient has been optimized and will be discharged to skilled nursing facility.   1. Ventricular tachycardia  -Cardiology/EP team directed care. -Patient was initially on amiodarone  drip, eventually transitioned to mexiletine twice daily and quinidine 3 times daily. -Patient be discharged on quinidine. -Amiodarone  has been discontinued. -Patient will follow-up with cardiology and electrophysiology team on discharge.   2. CAD  -Asymptomatic, continue nitrate, he is not on any beta-blockers.   3. HFrEF  -Compensated.     4. Atrial fibrillation  Continue Eliquis .     5. CKD 4; hyperkalemia  -Resolved.   -Stable renal function.    Hyperlipidemia: Continue atorvastatin .   Delirium:  -Resolved. -Treated with delirium precautions and Seroquel . -Seroquel  was discontinued on 04/17/2023 per daughter's recommendations. -Patient is fully alert and oriented today.   Deconditioning/generalized weakness: -PT has recommended subacute rehab.    Consults: cardiology, palliative care team and electrophysiology team.  Significant Diagnostic Studies:  PORTABLE CHEST 1 VIEW   COMPARISON:  03/19/2023   FINDINGS: Left chest wall CRT D. Sternotomy and CABG. Stable cardiomediastinal silhouette. Aortic atherosclerotic calcification. No focal consolidation, pleural effusion, or pneumothorax. Scarring in the right mid lung. No displaced rib fractures.   IMPRESSION: No active disease.     Electronically Signed   By: Norman Gatlin M.D.   On: 04/12/2023 19:33  Treatments:  -Medication adjusted.  Discharge Exam: Blood pressure 123/73, pulse 86, temperature 98.3 F (36.8 C), temperature source Oral, resp. rate 15, height 6' (1.829 m), weight 86 kg, SpO2 97%.   Disposition: Discharge disposition: 03-Skilled Nursing Facility       Discharge Instructions     Diet - low sodium heart healthy   Complete by: As directed    Increase activity slowly   Complete by: As directed       Allergies as of 04/22/2023       Reactions   Ranolazine  Other (See Comments)   States he went into a coma and was in hospital for 3 days after taking   Procaine Swelling, Other (See Comments)   Amiodarone  Nausea And Vomiting, Other (See Comments)        Medication List     STOP taking these medications    amiodarone  200 MG tablet Commonly known as: PACERONE    amoxicillin  500 MG capsule Commonly known as: AMOXIL    carvedilol  25 MG tablet Commonly known as: COREG    Vascepa  1 g  capsule Generic drug: icosapent  Ethyl       TAKE these medications    acetaminophen  500 MG tablet Commonly known as: TYLENOL  Take 500 mg by  mouth as needed for moderate pain (pain score 4-6), headache or mild pain (pain score 1-3).   albuterol  108 (90 Base) MCG/ACT inhaler Commonly known as: VENTOLIN  HFA Inhale 2 puffs into the lungs every 6 (six) hours as needed for wheezing or shortness of breath.   atorvastatin  40 MG tablet Commonly known as: LIPITOR Take 1 tablet (40 mg total) by mouth daily. What changed: when to take this   Eliquis  2.5 MG Tabs tablet Generic drug: apixaban  TAKE 1 TABLET TWICE DAILY   famotidine  40 MG tablet Commonly known as: PEPCID  Take 0.5 tablets (20 mg total) by mouth every evening.   feeding supplement Liqd Take 237 mLs by mouth 2 (two) times daily between meals.   furosemide  20 MG tablet Commonly known as: Lasix  Take 1 tablet (20 mg total) by mouth daily. What changed:  medication strength See the new instructions.   isosorbide  mononitrate 30 MG 24 hr tablet Commonly known as: IMDUR  Take 30 mg by mouth daily.   metoprolol  succinate 25 MG 24 hr tablet Commonly known as: TOPROL -XL Take 0.5 tablets (12.5 mg total) by mouth daily.   mexiletine 250 MG capsule Commonly known as: MEXITIL  Take 1 capsule (250 mg total) by mouth every 12 (twelve) hours. What changed:  medication strength See the new instructions.   multivitamin with minerals Tabs tablet Take 1 tablet by mouth in the morning.   pantoprazole  40 MG tablet Commonly known as: PROTONIX  Take 40 mg by mouth daily before breakfast.   polyethylene glycol 17 g packet Commonly known as: MIRALAX  / GLYCOLAX  Take 17 g by mouth daily.   quiNIDine sulfate  200 MG tablet Take 1 tablet (200 mg total) by mouth 3 (three) times daily.   scopolamine  1 MG/3DAYS Commonly known as: TRANSDERM-SCOP Place 1 patch (1.5 mg total) onto the skin every 3 (three) days.   senna-docusate 8.6-50 MG tablet Commonly known as: Senokot-S Take 1 tablet by mouth at bedtime as needed for mild constipation.   sodium bicarbonate  650 MG tablet Take  650 mg by mouth in the morning.   SYSTANE OP Place 1 drop into both eyes as needed (dry/irritated eyes.).        Contact information for after-discharge care     Destination     [facility] Preferred SNF .   Service: Skilled Nursing Contact information: [address] [place]  [address] [phone]                     Time spent: 35 minutes.  SignedBETHA Renato Applebaum 04/22/2023, 11:28 AM

## 2023-04-22 NOTE — Care Management Important Message (Signed)
 Important Message  Patient Details  Name: [name] MRN: [id number] Date of Birth: 1932-10-01   Important Message Given:  Yes - Medicare IM     Renie Ora 04/22/2023, 11:22 AM

## 2023-04-22 NOTE — TOC Transition Note (Signed)
 Transition of Care [facility]) - Discharge Note   Patient Details  Name: [name] MRN: [id number] Date of Birth: 10-Feb-1933  Transition of [facility]) CM/SW Contact:  Montie LOISE Louder, LCSW Phone Number: 04/22/2023, 11:34 AM   Clinical Narrative:     Patient will Discharge to: [facility] Discharge Date: 04/22/2023 Family Notified: daughter Transport Ab:[facility]  Per MD patient is ready for discharge. RN, patient, and facility notified of discharge. Discharge Summary sent to facility. RN given number for [phone]. Ambulance transport requested for patient.   Clinical Social Worker signing off.  Montie Louder, MSW, LCSW Clinical Social Worker     Final next level of care: Skilled Nursing Facility Barriers to Discharge: Barriers Resolved   Patient Goals and CMS Choice            Discharge Placement              Patient chooses bed at:  [facility]) Patient to be transferred to facility by: [facility] Name of family member notified: daughter Patient and family notified of of transfer: 04/22/23  Discharge Plan and Services Additional resources added to the After Visit Summary for       Post Acute Care Choice: Skilled Nursing Facility                               Social Drivers of Health (SDOH) Interventions SDOH Screenings   Food Insecurity: Unknown (04/13/2023)  Housing: Unknown (04/13/2023)  Transportation Needs: No Transportation Needs (03/19/2023)  Utilities: Not At Risk (03/19/2023)  Tobacco Use: Low Risk  (04/13/2023)     Readmission Risk Interventions     No data to display

## 2023-04-22 NOTE — Care Management (Signed)
 Patient seen and examined. Has some nausea but no other complaints. HR has remained stable. Reviewed discharge summary and med rec. No changes needed. Stable for discharge to SNF    No charge visit.

## 2023-04-22 NOTE — Progress Notes (Signed)
 Report called to [place] at facility

## 2023-04-23 NOTE — Telephone Encounter (Signed)
 To MD for review/advisement

## 2023-04-24 ENCOUNTER — Telehealth: Payer: Self-pay | Admitting: Cardiology

## 2023-04-24 DIAGNOSIS — I2510 Atherosclerotic heart disease of native coronary artery without angina pectoris: Secondary | ICD-10-CM | POA: Diagnosis not present

## 2023-04-24 DIAGNOSIS — N184 Chronic kidney disease, stage 4 (severe): Secondary | ICD-10-CM | POA: Diagnosis not present

## 2023-04-24 DIAGNOSIS — E785 Hyperlipidemia, unspecified: Secondary | ICD-10-CM | POA: Diagnosis not present

## 2023-04-24 DIAGNOSIS — I5020 Unspecified systolic (congestive) heart failure: Secondary | ICD-10-CM | POA: Diagnosis not present

## 2023-04-24 DIAGNOSIS — R5381 Other malaise: Secondary | ICD-10-CM | POA: Diagnosis not present

## 2023-04-24 DIAGNOSIS — I4891 Unspecified atrial fibrillation: Secondary | ICD-10-CM | POA: Diagnosis not present

## 2023-04-24 DIAGNOSIS — I951 Orthostatic hypotension: Secondary | ICD-10-CM | POA: Diagnosis not present

## 2023-04-24 DIAGNOSIS — I472 Ventricular tachycardia, unspecified: Secondary | ICD-10-CM | POA: Diagnosis not present

## 2023-04-24 NOTE — Telephone Encounter (Signed)
 Facility calling back to check on status of call. If you call after 5 ask to speak to pt's nurse

## 2023-04-24 NOTE — Telephone Encounter (Signed)
 Pt c/o medication issue:  1. Name of Medication:    2. How are you currently taking this medication (dosage and times per day)?    3. Are you having a reaction (difficulty breathing--STAT)? no  4. What is your medication issue? Facility is calling with concern for patient medication because of the patient bp. States when patient stands up bp drops. Patient get very dizzy and nausea. Facility calling to see what changes can be made. Also, want to stay the patient hasn't been taken quiNIDine sulfate  200 MG tablet. Please advise

## 2023-04-24 NOTE — Telephone Encounter (Signed)
 I returned call to facility, spoke to [place]. She reports pt very orthostatic.  Patient has not taken any Quinidine since arriving there on 1/13 d/t they did not have this medication in stock.  Informed me that NP there changed to 300 mg TID (they have this dose in stock at pharmacy). Spoke to Dr. Lawana Pray.   Called back to facility and advised to take 300 mg BID per Dr. Lawana Pray. Aware that this is the only options we have left to try and control his VT, there are not other options to try/switch to.   She reports that NP there wanted to send pt back to hospital b/c of orthostatics but pt refused. They are placing a hospice referral. She appreciates my return call.

## 2023-04-25 DIAGNOSIS — I472 Ventricular tachycardia, unspecified: Secondary | ICD-10-CM | POA: Diagnosis not present

## 2023-04-25 DIAGNOSIS — E785 Hyperlipidemia, unspecified: Secondary | ICD-10-CM | POA: Diagnosis not present

## 2023-04-25 DIAGNOSIS — R5381 Other malaise: Secondary | ICD-10-CM | POA: Diagnosis not present

## 2023-04-25 DIAGNOSIS — I4891 Unspecified atrial fibrillation: Secondary | ICD-10-CM | POA: Diagnosis not present

## 2023-04-25 DIAGNOSIS — I5020 Unspecified systolic (congestive) heart failure: Secondary | ICD-10-CM | POA: Diagnosis not present

## 2023-04-25 DIAGNOSIS — I951 Orthostatic hypotension: Secondary | ICD-10-CM | POA: Diagnosis not present

## 2023-04-25 DIAGNOSIS — N184 Chronic kidney disease, stage 4 (severe): Secondary | ICD-10-CM | POA: Diagnosis not present

## 2023-04-25 DIAGNOSIS — I2510 Atherosclerotic heart disease of native coronary artery without angina pectoris: Secondary | ICD-10-CM | POA: Diagnosis not present

## 2023-04-29 ENCOUNTER — Telehealth: Payer: Self-pay

## 2023-04-29 ENCOUNTER — Encounter: Payer: Self-pay | Admitting: Cardiology

## 2023-04-29 ENCOUNTER — Telehealth: Payer: Self-pay | Admitting: Cardiology

## 2023-04-29 DIAGNOSIS — N179 Acute kidney failure, unspecified: Secondary | ICD-10-CM | POA: Diagnosis not present

## 2023-04-29 DIAGNOSIS — N189 Chronic kidney disease, unspecified: Secondary | ICD-10-CM | POA: Diagnosis not present

## 2023-04-29 DIAGNOSIS — R5381 Other malaise: Secondary | ICD-10-CM | POA: Diagnosis not present

## 2023-04-29 DIAGNOSIS — I951 Orthostatic hypotension: Secondary | ICD-10-CM | POA: Diagnosis not present

## 2023-04-29 DIAGNOSIS — I4891 Unspecified atrial fibrillation: Secondary | ICD-10-CM | POA: Diagnosis not present

## 2023-04-29 DIAGNOSIS — I472 Ventricular tachycardia, unspecified: Secondary | ICD-10-CM | POA: Diagnosis not present

## 2023-04-29 DIAGNOSIS — R7989 Other specified abnormal findings of blood chemistry: Secondary | ICD-10-CM | POA: Diagnosis not present

## 2023-04-29 DIAGNOSIS — R110 Nausea: Secondary | ICD-10-CM | POA: Diagnosis not present

## 2023-04-29 DIAGNOSIS — I5020 Unspecified systolic (congestive) heart failure: Secondary | ICD-10-CM | POA: Diagnosis not present

## 2023-04-29 NOTE — Telephone Encounter (Addendum)
1/18 - sustained VT ( 35 sec) failed ATP, 1 successful shock at 40J. Recent VT storm and hospitalization on 04/12/23. Started Mexiletine and Quinidine. Hasn't been taking quinidine, dose increased to 300mg  bid per WC on 1/15. NP tried sending to ER then, patient refused, Hospice has been called in. Appt with Canary Brim, NP on 1/22.    Nurse states that he is passing out whenever he sits or stands. They have him bed bound.  He did not want to go in to the hospital last week as encouraged by NP.  Hospice was going to be called in; however, it hasn't yet due to conflicts in payment because he can't be "in rehab and on hospice".  Logistics are being worked out.   Vomiting Sat pm from 10pm - 4am during event.   Family doesn't want him to take quinidine because it makes him vomit. They want him back off the medication.  He, nor facility per facility,  are aware that he had a "shock" event, but is not doing well in general.

## 2023-04-29 NOTE — Telephone Encounter (Signed)
Please see separate note regarding VT/Shock event over the 04/27/23 weekend.

## 2023-04-29 NOTE — Telephone Encounter (Signed)
Pt c/o medication issue:  1. Name of Medication: quiNIDine sulfate 200 MG tablet   2. How are you currently taking this medication (dosage and times per day)? As written   3. Are you having a reaction (difficulty breathing--STAT)? No   4. What is your medication issue? Cala Bradford with [employer] called in stating pt's family wishes to discontinue this medication due to pt nausea.

## 2023-04-29 NOTE — Telephone Encounter (Signed)
Per Dr. Elberta Fortis:   There are no other medication options for this patient. If he needs to go back to the hospital, that is fine and we can have goals of care discussions. He has no way to treat his VT as he is intolerant to multiple medications. If he does go back to the hospital, will need to have palliative care conversations, but there is not a whole lot more that we can do as an outpatient. He does need to likely see palliative care as an outpatient for goals of care discussions if he does not wish to go back to the hospital.

## 2023-04-29 NOTE — Telephone Encounter (Signed)
I spoke with facility nurse, Cala Bradford early this morning and gave her Dr. Elberta Fortis instructions. (See below).  If family is wanting to stop the quinidine, that is fine, but that is our last options for med management and he should be in the least under palliative if not full hospice at this point to help manage his symptoms and comfort..   Nurse says they have a careplan meeting tomorrow to discuss.  I will attempt to reach daughter and let her know of Dr. Elberta Fortis recommendations at this stage for her father.

## 2023-04-29 NOTE — Telephone Encounter (Signed)
Received fax from [facility] with STAT EKG. Spoke with device clinic as patient has an ICD. Device clinic aware of patient and current situation. They have been in contact with [facility] as well as the patient's family today. Took STAT EKG and past EKG's from 03/19/23 and 04/12/23 to DOD Earney Hamburg, MD). DOD was not concerned with the findings as patient has a history of a left bundle branch block and afib. DOD stated that if patient is being shocked by ICD, he should be taken to the ED. [facility] was notified of DOD's opinion of the EKG and patient's current situation. Device clinic plans to follow up with patient's daughter today.

## 2023-04-29 NOTE — Telephone Encounter (Signed)
Cala Bradford from nursing home requesting cb regarding pt's appt wants to know if can be made a virtual.

## 2023-04-29 NOTE — Telephone Encounter (Signed)
LM for patient's daughter [name] and offered for her to call me back to discuss.

## 2023-04-30 NOTE — Telephone Encounter (Signed)
Pt daughter has more questions and would like to speak with mandy. I have let pt know she will get a call back

## 2023-04-30 NOTE — Telephone Encounter (Signed)
Spoke with daughter. Discussed no other tx options to manage patient's care, focus should be on comfort.  Reviewed what happens with device when we "turn off therapies" and what that means.   Daughter states that patient is working in the bed with therapy at present. They do not have hospice involved at this point.  Wants to see if he will improve first. But understands need to discuss moving in that direction.  She requests that at appt tomorrow, (she will not be present, her other family and patient will), daughter asks that we explain clearly to patient that pacemaker function still works on device when shock therapy is turned off. States her dad thinks that when we "turn it off" he will die shortly after.

## 2023-05-01 ENCOUNTER — Encounter: Payer: Self-pay | Admitting: Pulmonary Disease

## 2023-05-01 ENCOUNTER — Ambulatory Visit: Payer: Medicare HMO | Attending: Pulmonary Disease | Admitting: Pulmonary Disease

## 2023-05-01 ENCOUNTER — Ambulatory Visit: Payer: Medicare HMO | Admitting: Pulmonary Disease

## 2023-05-01 NOTE — Progress Notes (Signed)
Called patients facility for phone visit.    Unfortunately, his family was not there with him to further discuss his situation.  I spoke with [name] and he indicates he has not been out of bed, but has been doing exercises in bed 2x per day.  He is supposed to try to get out of bed tomorrow with therapy. He confirms he has stopped taking his prescribed quinidine due to nausea and his nausea has improved.  However, he does not have an appetite. We discussed his shock therapy and he understands it to still be on / in place.  He does not want to turn it off.  He does not remember the shock on 04/27/23.  I reviewed with him that at some point he may be at risk for multiple shocks and he understands.  He had no further questions & no family present. I spoke with [name], DON at the facility and she confirms that he is getting his mexiletine 250 mg BID at the facility. Reviewed with her that he has unfortunately exhausted antiarrhythmic options and that at some point he may be shocked multiple times.  She confirms he is a DNR.  They do not have a magnet at the facility. She indicates she would call EMS in the event he were to receive multiple shocks.     Canary Brim, NP-C, AGACNP-BC [facility] - Electrophysiology  05/01/2023, 4:47 PM

## 2023-05-08 ENCOUNTER — Telehealth: Payer: Self-pay

## 2023-05-08 DIAGNOSIS — I951 Orthostatic hypotension: Secondary | ICD-10-CM | POA: Diagnosis not present

## 2023-05-08 DIAGNOSIS — R110 Nausea: Secondary | ICD-10-CM | POA: Diagnosis not present

## 2023-05-08 DIAGNOSIS — N179 Acute kidney failure, unspecified: Secondary | ICD-10-CM | POA: Diagnosis not present

## 2023-05-08 DIAGNOSIS — I4891 Unspecified atrial fibrillation: Secondary | ICD-10-CM | POA: Diagnosis not present

## 2023-05-08 DIAGNOSIS — I472 Ventricular tachycardia, unspecified: Secondary | ICD-10-CM | POA: Diagnosis not present

## 2023-05-08 DIAGNOSIS — R7989 Other specified abnormal findings of blood chemistry: Secondary | ICD-10-CM | POA: Diagnosis not present

## 2023-05-08 DIAGNOSIS — I5020 Unspecified systolic (congestive) heart failure: Secondary | ICD-10-CM | POA: Diagnosis not present

## 2023-05-08 DIAGNOSIS — N189 Chronic kidney disease, unspecified: Secondary | ICD-10-CM | POA: Diagnosis not present

## 2023-05-08 DIAGNOSIS — R5381 Other malaise: Secondary | ICD-10-CM | POA: Diagnosis not present

## 2023-05-08 NOTE — Telephone Encounter (Signed)
Device alert for sustained VT wtih successful ATP Events occurred 1/28 @ 16:53, 16:57, and 17:02, duration 17-18sec  EGM c/w sustained VT, all pace terminated with 1 burst of ATP Presenting AF, overall controlled rates, Eliquis per PA report HF diagnostics currently abnormal  Per EPIC pt no longer has medication options to treat arrhythmia, pt wishes to keep therapies on Shock alert was reset on [facility]. Route to triage high alert per protocol [place], CVRS  Nothing further can be done for VT from a treatment perspective, patient is aware.   FYI to Canary Brim, NP and Dr. Elberta Fortis.

## 2023-05-09 ENCOUNTER — Emergency Department (HOSPITAL_COMMUNITY)
Admission: EM | Admit: 2023-05-09 | Discharge: 2023-05-09 | Disposition: A | Discharge status: Home / Self Care | Payer: Medicare HMO

## 2023-05-09 ENCOUNTER — Other Ambulatory Visit: Payer: Self-pay

## 2023-05-09 ENCOUNTER — Encounter (HOSPITAL_COMMUNITY): Payer: Self-pay | Admitting: Emergency Medicine

## 2023-05-09 ENCOUNTER — Emergency Department (HOSPITAL_COMMUNITY): Payer: Medicare HMO

## 2023-05-09 DIAGNOSIS — Z7901 Long term (current) use of anticoagulants: Secondary | ICD-10-CM | POA: Insufficient documentation

## 2023-05-09 DIAGNOSIS — R918 Other nonspecific abnormal finding of lung field: Secondary | ICD-10-CM | POA: Diagnosis not present

## 2023-05-09 DIAGNOSIS — T82897A Other specified complication of cardiac prosthetic devices, implants and grafts, initial encounter: Secondary | ICD-10-CM | POA: Diagnosis not present

## 2023-05-09 DIAGNOSIS — I472 Ventricular tachycardia, unspecified: Secondary | ICD-10-CM | POA: Diagnosis not present

## 2023-05-09 DIAGNOSIS — Z4502 Encounter for adjustment and management of automatic implantable cardiac defibrillator: Secondary | ICD-10-CM

## 2023-05-09 DIAGNOSIS — Z951 Presence of aortocoronary bypass graft: Secondary | ICD-10-CM | POA: Diagnosis not present

## 2023-05-09 DIAGNOSIS — Y69 Unspecified misadventure during surgical and medical care: Secondary | ICD-10-CM | POA: Diagnosis not present

## 2023-05-09 DIAGNOSIS — R059 Cough, unspecified: Secondary | ICD-10-CM | POA: Diagnosis not present

## 2023-05-09 DIAGNOSIS — I509 Heart failure, unspecified: Secondary | ICD-10-CM | POA: Insufficient documentation

## 2023-05-09 LAB — BASIC METABOLIC PANEL
Anion gap: 17 — ABNORMAL HIGH (ref 5–15)
BUN: 29 mg/dL — ABNORMAL HIGH (ref 8–23)
CO2: 18 mmol/L — ABNORMAL LOW (ref 22–32)
Calcium: 8.7 mg/dL — ABNORMAL LOW (ref 8.9–10.3)
Chloride: 97 mmol/L — ABNORMAL LOW (ref 98–111)
Creatinine, Ser: 2.28 mg/dL — ABNORMAL HIGH (ref 0.61–1.24)
GFR, Estimated: 27 mL/min — ABNORMAL LOW (ref >60)
Glucose, Bld: 124 mg/dL — ABNORMAL HIGH (ref 70–99)
Potassium: 4.8 mmol/L (ref 3.5–5.1)
Sodium: 132 mmol/L — ABNORMAL LOW (ref 135–145)

## 2023-05-09 LAB — CBC
HCT: 46.5 % (ref 39.0–52.0)
Hemoglobin: 14.6 g/dL (ref 13.0–17.0)
MCH: 28.7 pg (ref 26.0–34.0)
MCHC: 31.4 g/dL (ref 30.0–36.0)
MCV: 91.4 fL (ref 80.0–100.0)
Platelets: 314 10*3/uL (ref 150–400)
RBC: 5.09 MIL/uL (ref 4.22–5.81)
RDW: 15.4 % (ref 11.5–15.5)
WBC: 14.6 10*3/uL — ABNORMAL HIGH (ref 4.0–10.5)
nRBC: 0 % (ref 0.0–0.2)

## 2023-05-09 LAB — MAGNESIUM: Magnesium: 2.1 mg/dL (ref 1.7–2.4)

## 2023-05-09 LAB — TROPONIN I (HIGH SENSITIVITY)
Troponin I (High Sensitivity): 33 ng/L — ABNORMAL HIGH (ref <18)
Troponin I (High Sensitivity): 27 ng/L — ABNORMAL HIGH (ref <18)

## 2023-05-09 MED ORDER — METOPROLOL SUCCINATE ER 25 MG PO TB24: 25.0000 mg | ORAL_TABLET | Freq: Every day | ORAL | 0 refills | Status: DC

## 2023-05-09 MED ORDER — METOPROLOL SUCCINATE ER 25 MG PO TB24
50.0000 mg | ORAL_TABLET | Freq: Every day | ORAL | Status: DC
  Administered 2023-05-09: 50 mg via ORAL
  Filled 2023-05-09: qty 2

## 2023-05-09 NOTE — Discharge Instructions (Signed)
Please take the higher dose of the metoprolol and follow-up with your cardiologist as an outpatient.  Return to the ER for worsening symptoms.

## 2023-05-09 NOTE — Consult Note (Addendum)
ELECTROPHYSIOLOGY CONSULT NOTE    Patient ID: [name] MRN: [id number], DOB/AGE: 1933-03-11 89 y.o.  Admit date: 05/09/2023 Date of Consult: 05/09/2023  Primary Physician: Joycelyn Rua, MD Primary Cardiologist: Armanda Magic, MD  Electrophysiologist: Dr. Elberta Fortis   Referring Provider: Dr. Rhae Hammock  Patient Profile: [name] is a 89 y.o. male with a history of permanent AF, sCHF, VT status post ICD, CAD s/p CABG with AVR who is being seen today for the evaluation of VT and ICD shock at the request of Dr. Rhae Hammock.  HPI:  [name] is a 89 y.o. male well-known to EP service who presented to the ER on 05/09/2023 after two ICD shocks.  The patient reports he has been living in a rehab facility but he does not feel like it is helping.  He notes that he has had terrible nausea to the point that he can only tolerate yogurt.  He reports if he eats anything beyond that he vomits.  He unfortunately has had known recurrent VT and VT storm since mid December 2024 with recurrent hospitalizations.  He has failed multiple antiarrhythmics including amiodarone due to nausea, quinidine due to nausea.  During a recent hospitalization he was on prolonged IV amiodarone and continued to have breakthrough VT requiring ATP.  His VT zones were adjusted during last admission.  He most recently was prescribed mexiletine and quinidine after discharge.  Unfortunately he was unable to tolerate quinidine and this was stopped by he and his family.  He is on monotherapy mexiletine and Toprol.  Patient is DNR/DNI in the event of cardiac arrest.  Multiple conversations have been had with he and his family regarding potentially turning off ICD therapies and he elects to continue to leave them in place.  Currently he states he would like to try for 1 more month to see if he can get better.  He reports fatigue and nausea but otherwise no pain.  Initial labs-NA 132, K4.8, CL 97, CO2 18, BUN 29/CR 2.28, Mg + 2.1, troponin 33,  WBC 14.6, Hgb 14.6, platelets 314.  CXR showed mild basilar atelectasis and small left effusion.  He denies chest pain, palpitations, dyspnea, PND, orthopnea, nausea, vomiting, dizziness, syncope, edema, weight gain, or early satiety.   Labs Potassium4.8 (01/30 4098) Magnesium  2.1 (01/30 0950) Creatinine, ser  2.28* (01/30 0950) PLT  314 (01/30 0950) HGB  14.6 (01/30 0950) WBC 14.6* (01/30 0950) Troponin I (High Sensitivity)33* (01/30 0950).    Past Medical History:  Diagnosis Date   Arthritis    "minor everywhere" (04/22/2015)   Asthma    "a touch"   Barrett esophagus    CAD (coronary artery disease)    a. s/p 3 vvessel CABG with LIMA to LAD, SVG to diagonal 1, SVG to RCA 12/09.   Cardiac resynchronization therapy defibrillator (CRT-D) in place    Chronic anticoagulation 01/23/2013   Chronic combined systolic and diastolic CHF (congestive heart failure) (HCC)    dry weight 197-200lbs.   CKD (chronic kidney disease) stage 3, GFR 30-59 ml/min (HCC) 08/23/2014   Complication of anesthesia    DCM (dilated cardiomyopathy) (HCC)    initially concerned for TTP amyloidosis but Tc-PYP scan 06/2017 was not consistent with amyloid and SPEP/UPEP with no M spike. EF 20-25% by echo 2019   Diverticulitis 08/2019   Dyslipidemia 01/23/2013   Essential hypertension 01/23/2013   GERD (gastroesophageal reflux disease)    GI bleed 05/08/2017   H/O: rheumatic fever    Hepatitis  1957   "don't know what kind:   History of hiatal hernia    History of PFTs    a. Amiodarone started 5/16 >> PFTs w/ DLCO 6/16:  FEV1 72% predicted, FEV1/FVC 66%, DLCO 66% >> minimal reversible obstructive airways disease with mild diffusion defect (suggestive of emphysema but absence of hyperinflation inconsistent with dx)   Hyperkalemia 08/23/2014   Hyperthyroidism 05/26/2017   Leg weakness 01/28/2017   Multiple thyroid nodules 08/13/2017   Orthostatic hypotension    Persistent atrial fibrillation (HCC) 01/23/2013    Pleural effusion on right 05/26/2017   Pneumonia 08/2014   "dr thought I may have had a touch"   Pre-diabetes    Protein-calorie malnutrition, severe 05/27/2017   S/P AVR (aortic valve replacement) 2009   a. severe AS s/p AVR with pericardial tissue valve 2009.   Shingles 08/23/2014   VT (ventricular tachycardia) (HCC) 05/06/2017     Surgical History:  Past Surgical History:  Procedure Laterality Date   AORTIC VALVE REPLACEMENT  with 23-mm Magna Ease pericardial valve, model number   with 23-mm Magna Ease pericardial valve, model [id number], serial number [id number]    APPENDECTOMY  1940s   BI-VENTRICULAR IMPLANTABLE CARDIOVERTER DEFIBRILLATOR  (CRT-D)  04/22/2015   BIV ICD GENERATOR CHANGEOUT N/A 09/18/2022   Procedure: BIV ICD GENERATOR CHANGEOUT;  Surgeon: Regan Lemming, MD;  Location: [facility] INVASIVE CV LAB;  Service: Cardiovascular;  Laterality: N/A;   CARDIAC CATHETERIZATION N/A 11/04/2014   Procedure: Left Heart Cath and Cors/Grafts Angiography;  Surgeon: Marykay Lex, MD;  Location: [facility] INVASIVE CV LAB;  Service: Cardiovascular;  Laterality: N/A;   CARDIAC VALVE REPLACEMENT     CARDIOVERSION N/A 01/27/2013   Procedure: CARDIOVERSION;  Surgeon: Quintella Reichert, MD;  Location: [facility] ENDOSCOPY;  Service: Cardiovascular;  Laterality: N/A;   CARDIOVERSION N/A 08/25/2014   Procedure: CARDIOVERSION;  Surgeon: Thurmon Fair, MD;  Location: [facility] ENDOSCOPY;  Service: Cardiovascular;  Laterality: N/A;   CARDIOVERSION N/A 05/28/2018   Procedure: CARDIOVERSION;  Surgeon: Chrystie Nose, MD;  Location: [facility] ENDOSCOPY;  Service: Cardiovascular;  Laterality: N/A;   CARDIOVERSION N/A 10/28/2019   Procedure: CARDIOVERSION;  Surgeon: Quintella Reichert, MD;  Location: [facility] ENDOSCOPY;  Service: Cardiovascular;  Laterality: N/A;   CARDIOVERSION N/A 09/01/2020   Procedure: CARDIOVERSION;  Surgeon: Lewayne Bunting, MD;  Location[facility] ENDOSCOPY;  Service: Cardiovascular;  Laterality: N/A;   CARDIOVERSION N/A  04/04/2022   Procedure: CARDIOVERSION;  Surgeon: Chrystie Nose, MD;  Location: [facility] ENDOSCOPY;  Service: Cardiovascular;  Laterality: N/A;   CATARACT EXTRACTION W/ INTRAOCULAR LENS  IMPLANT, BILATERAL Bilateral    CHEST TUBE INSERTION Right 07/26/2017   Procedure: INSERTION PLEURAL DRAINAGE CATHETER - RIGHT;  Surgeon: Kerin Perna, MD;  Location: [facility] OR;  Service: Thoracic;  Laterality: Right;   CORONARY ARTERY BYPASS GRAFT  03/10/2008   x 3 Dr. Dorris Fetch   EP IMPLANTABLE DEVICE N/A 04/22/2015   Procedure: BiV ICD Insertion CRT-D;  Surgeon: Pasquale Matters Jorja Loa, MD;  Location: [facility] INVASIVE CV LAB;  Service: Cardiovascular;  Laterality: N/A;   ESOPHAGOGASTRODUODENOSCOPY (EGD) WITH ESOPHAGEAL DILATION  X1   ESOPHAGOGASTRODUODENOSCOPY (EGD) WITH PROPOFOL N/A 05/09/2017   Procedure: ESOPHAGOGASTRODUODENOSCOPY (EGD) WITH PROPOFOL;  Surgeon: Jeani Hawking, MD;  Location: [facility] ENDOSCOPY;  Service: Endoscopy;  Laterality: N/A;   IR THORACENTESIS ASP PLEURAL SPACE W/IMG GUIDE  05/27/2017   IR THORACENTESIS ASP PLEURAL SPACE W/IMG GUIDE  06/17/2017   IR THORACENTESIS ASP PLEURAL SPACE W/IMG GUIDE  06/18/2017   IR THORACENTESIS ASP  PLEURAL SPACE W/IMG GUIDE  07/11/2017   REMOVAL OF PLEURAL DRAINAGE CATHETER Right 09/13/2017   Procedure: REMOVAL OF PLEURAL DRAINAGE CATHETER;  Surgeon: Kerin Perna, MD;  Location: [facility] OR;  Service: Thoracic;  Laterality: Right;   RIGHT HEART CATH AND CORONARY/GRAFT ANGIOGRAPHY N/A 05/31/2017   Procedure: RIGHT HEART CATH AND CORONARY/GRAFT ANGIOGRAPHY;  Surgeon: Dolores Patty, MD;  Location: [facility] INVASIVE CV LAB;  Service: Cardiovascular;  Laterality: N/A;   TONSILLECTOMY       (Not in a hospital admission)   Inpatient Medications:   metoprolol succinate  50 mg Oral Daily    Allergies:  Allergies  Allergen Reactions   Ranolazine Other (See Comments)    States he went into a coma and was in hospital for 3 days after taking   Procaine Swelling and Other (See  Comments)   Amiodarone Nausea And Vomiting and Other (See Comments)    Family History  Problem Relation Age of Onset   Alzheimer's disease Mother    COPD Daughter    Alzheimer's disease Other    COPD Sister    Depression Sister    Heart disease Sister    Hyperlipidemia Sister    Hypertension Sister    Esophageal cancer Sister        + smoker     Physical Exam: Vitals:   05/09/23 0935 05/09/23 1315  BP: 132/86 127/74  Pulse: (!) 112 88  Resp: 19 (!) 24  Temp: 98 F (36.7 C)   TempSrc: Oral   SpO2: 100% 97%    GEN-chronically ill-appearing adult male, NAD, A&O x 3, normal affect HEENT: Normocephalic, atraumatic Lungs- CTAB, Normal effort.  Heart- Regular rate and rhythm, No M/G/R.  GI- Soft, NT, ND.  Extremities- No clubbing, cyanosis, or edema   Radiology/Studies: DG Chest Portable 1 View Result Date: 05/09/2023 CLINICAL DATA:  Cough. EXAM: PORTABLE CHEST 1 VIEW COMPARISON:  April 12, 2023. FINDINGS: Stable cardiomediastinal silhouette. Left-sided defibrillator is unchanged. Status post coronary bypass graft. Mild bibasal atelectasis or edema is noted with small left pleural effusion. Bony thorax unremarkable. IMPRESSION: Mild bibasilar atelectasis or edema is noted with small left pleural effusion. Electronically Signed   By: Lupita Raider M.D.   On: 05/09/2023 11:19   ECHOCARDIOGRAM COMPLETE Result Date: 04/14/2023    ECHOCARDIOGRAM REPORT   Patient Name:   [name] Date of Exam: 04/14/2023 Medical Rec #:  [id number]       Height:       72.0 in Accession #:    [id number]      Weight:       194.7 lb Date of Birth:  [birth year]       BSA:          2.106 m Patient Age:    89 years        BP:           146/87 mmHg Patient Gender: M               HR:           76 bpm. Exam Location:  Inpatient Procedure: 2D Echo, Cardiac Doppler and Color Doppler Indications:    Ventricular tachycardia  History:        Patient has no prior history of Echocardiogram examinations and                  Patient has prior history of Echocardiogram examinations, most  recent 09/07/2019. CHF, CAD, Pacemaker and Prior CABG,                 Arrythmias:Atrial Fibrillation; Risk Factors:Hypertension and                 Dyslipidemia. CKD.  Sonographer:    Vern Claude Referring Phys: [phone] Salvatore Decent KLEIN IMPRESSIONS  1. Left ventricular ejection fraction, by estimation, is 20 to 25%. The left ventricle has severely decreased function. The left ventricle demonstrates global hypokinesis. Left ventricular diastolic parameters are indeterminate.  2. Right ventricular systolic function is normal. The right ventricular size is normal.  3. Left atrial size was severely dilated.  4. The mitral valve is normal in structure. No evidence of mitral valve regurgitation. No evidence of mitral stenosis. Moderate mitral annular calcification.  5. 23 magna ease pericardial valve is in the aortic valve position. . The aortic valve has been repaired/replaced. Aortic valve regurgitation is not visualized. No aortic stenosis is present. Aortic valve mean gradient measures 4.0 mmHg.  6. The inferior vena cava is normal in size with greater than 50% respiratory variability, suggesting right atrial pressure of 3 mmHg. FINDINGS  Left Ventricle: Left ventricular ejection fraction, by estimation, is 20 to 25%. The left ventricle has severely decreased function. The left ventricle demonstrates global hypokinesis. The left ventricular internal cavity size was normal in size. There is no left ventricular hypertrophy. Left ventricular diastolic parameters are indeterminate. Right Ventricle: The right ventricular size is normal. Right vetricular wall thickness was not well visualized. Right ventricular systolic function is normal. Left Atrium: Left atrial size was severely dilated. Right Atrium: Right atrial size was not well visualized. Pericardium: There is no evidence of pericardial effusion. Mitral Valve: The mitral valve is normal  in structure. There is mild thickening of the mitral valve leaflet(s). There is mild calcification of the mitral valve leaflet(s). Moderate mitral annular calcification. No evidence of mitral valve regurgitation. No evidence of mitral valve stenosis. MV peak gradient, 6.9 mmHg. The mean mitral valve gradient is 2.0 mmHg. Tricuspid Valve: The tricuspid valve is normal in structure. Tricuspid valve regurgitation is not demonstrated. No evidence of tricuspid stenosis. Aortic Valve: 23 magna ease pericardial valve is in the aortic valve position. The aortic valve has been repaired/replaced. Aortic valve regurgitation is not visualized. No aortic stenosis is present. Aortic valve mean gradient measures 4.0 mmHg. Aortic valve peak gradient measures 6.2 mmHg. Aortic valve area, by VTI measures 1.82 cm. Pulmonic Valve: The pulmonic valve was not well visualized. Pulmonic valve regurgitation is not visualized. No evidence of pulmonic stenosis. Aorta: The aortic root and ascending aorta are structurally normal, with no evidence of dilitation. Venous: The inferior vena cava is normal in size with greater than 50% respiratory variability, suggesting right atrial pressure of 3 mmHg. IAS/Shunts: The interatrial septum was not well visualized. Additional Comments: A device lead is visualized in the right atrium and right ventricle.  LEFT VENTRICLE PLAX 2D LVIDd:         5.70 cm      Diastology LVIDs:         4.70 cm      LV e' medial:   6.42 cm/s LV PW:         0.90 cm      LV E/e' medial: 21.5 LV IVS:        0.70 cm LVOT diam:     2.20 cm LV SV:         37 LV  SV Index:   17 LVOT Area:     3.80 cm  LV Volumes (MOD) LV vol d, MOD A2C: 171.0 ml LV vol d, MOD A4C: 184.0 ml LV vol s, MOD A2C: 101.0 ml LV vol s, MOD A4C: 119.0 ml LV SV MOD A2C:     70.0 ml LV SV MOD A4C:     184.0 ml LV SV MOD BP:      67.9 ml RIGHT VENTRICLE            IVC RV Basal diam:  3.70 cm    IVC diam: 1.40 cm RV Mid diam:    2.50 cm RV S prime:     6.57 cm/s  TAPSE (M-mode): 1.3 cm LEFT ATRIUM              Index        RIGHT ATRIUM           Index LA diam:        4.60 cm  2.18 cm/m   RA Area:     21.20 cm LA Vol (A2C):   135.0 ml 64.10 ml/m  RA Volume:   63.50 ml  30.15 ml/m LA Vol (A4C):   110.0 ml 52.23 ml/m LA Biplane Vol: 122.0 ml 57.92 ml/m  AORTIC VALVE                    PULMONIC VALVE AV Area (Vmax):    1.74 cm     PV Vmax:       0.49 m/s AV Area (Vmean):   1.63 cm     PV Peak grad:  1.0 mmHg AV Area (VTI):     1.82 cm AV Vmax:           124.00 cm/s AV Vmean:          87.500 cm/s AV VTI:            0.202 m AV Peak Grad:      6.2 mmHg AV Mean Grad:      4.0 mmHg LVOT Vmax:         56.60 cm/s LVOT Vmean:        37.600 cm/s LVOT VTI:          0.097 m LVOT/AV VTI ratio: 0.48  AORTA Ao Root diam: 3.30 cm Ao Asc diam:  2.50 cm MITRAL VALVE MV Area (PHT): 6.12 cm     SHUNTS MV Area VTI:   1.82 cm     Systemic VTI:  0.10 m MV Peak grad:  6.9 mmHg     Systemic Diam: 2.20 cm MV Mean grad:  2.0 mmHg MV Vmax:       1.31 m/s MV Vmean:      60.7 cm/s MV Decel Time: 124 msec MV E velocity: 138.00 cm/s Dina Rich MD Electronically signed by Dina Rich MD Signature Date/Time: 04/14/2023/1:22:10 PM    Final    DG Chest Port 1 View Result Date: 04/12/2023 CLINICAL DATA:  Shortness of breath EXAM: PORTABLE CHEST 1 VIEW COMPARISON:  03/19/2023 FINDINGS: Left chest wall CRT D. Sternotomy and CABG. Stable cardiomediastinal silhouette. Aortic atherosclerotic calcification. No focal consolidation, pleural effusion, or pneumothorax. Scarring in the right mid lung. No displaced rib fractures. IMPRESSION: No active disease. Electronically Signed   By: Minerva Fester M.D.   On: 04/12/2023 19:33    EKG: 05/09/2023 > ST with intermittent slow NSVT (personally reviewed)  TELEMETRY: SR-ST with intermittent slow NSVT (personally reviewed)  DEVICE  HISTORY:  MDT CRT-D, implanted 04/22/2015 with generator change 09/18/2022      Assessment/Plan:  Recurrent VT   Unfortunate situation with limited antiarrhythmic drug options available.  EP has had multiple discussions with patient and family regarding plan of care and limited options.  He previously is elected to be DNR/DNI which is appropriate.  We have had ongoing discussions with he and his family regarding potentially turning off ICD therapies.  Again, today the patient states that he would like to leave them on and hopes to try to continue rehab efforts for 1 more month and then make a decision.  -increase metoprolol XL to 50mg  daily  -okay for patient to return back to rehab facility -continue preadmission mexiletine -no further changes to VT zones at this time  -tele monitoring while in ER -DNR / DNI  -support offered, continue PT efforts as able      Device Review since 1/30 (see above) 28 of 29 episodes of VT were pace terminated with ATP  1 shock terminated episode  Effective VP 47% Greater than 5 years remaining on battery     For questions or updates, please contact [facility] Please consult [email or web address] for contact info under Cardiology/STEMI.  Signed, Canary Brim, NP-C, AGACNP-BC [facility] - Electrophysiology  05/09/2023, 1:49 PM  I have seen and examined this patient with Canary Brim.  Agree with above, note added to reflect my findings.  Patient with a past history of atrial fibrillation, chronic systolic heart failure, ventricular tachycardia, coronary artery disease.  He has a longstanding history of ventricular tachycardia.  He has been treated with multiple antiarrhythmic medications including amiodarone, mexiletine, sotalol, quinidine.  He has not tolerated these medications due to severe nausea.  He presented to the hospital today after ICD shocks x 2.  He currently feels well and is without major complaint.  He had no acute complaints around the time of his ICD shocks.  He does complain of chronic nausea, and is unable to eat.  GEN: Well nourished, well  developed, in no acute distress  HEENT: normal  Neck: no JVD, carotid bruits, or masses Cardiac: Irregular; no murmurs, rubs, or gallops,no edema  Respiratory:  clear to auscultation bilaterally, normal work of breathing GI: soft, nontender, nondistended, + BS MS: no deformity or atrophy  Skin: warm and dry, device site well healed Neuro:  Strength and sensation are intact Psych: euthymic mood, full affect   VT storm: Patient has had multiple episodes of ventricular tachycardia.  He has not tolerated antiarrhythmic medications.  Mashanda Ishibashi start Toprol-XL 50 mg.  If his blood pressure is not tolerated, this Vaun Hyndman need to be held.  Unfortunately, he has no other options to treat his ventricular tachycardia.  There have been many conversations with the patient about goals of care, but the patient is adamant that for now he would like to continue with ICD therapies turned on.  There is no indication for admission.  Patient can be discharged home. Chronic systolic heart failure: Patient does not have overt volume overload.  I am concerned about his nausea as a symptom of his heart failure with chronically low output state.  At this point, there is again no obvious intervention.  Would continue with symptom management for his nausea.  Akiya Morr M. Maely Clements MD 05/09/2023 5:26 PM

## 2023-05-09 NOTE — ED Notes (Signed)
Reviewed discharge instructions with patient and nursing home staff. Follow-up care and medications reviewed. Patient and nursing home staff verbalized understanding. Patient A&Ox4, VSS upon discharge.

## 2023-05-09 NOTE — ED Notes (Signed)
Per pt, he came in a shirt and "diaper"

## 2023-05-09 NOTE — ED Provider Notes (Signed)
[place] EMERGENCY DEPARTMENT AT [facility] Provider Note   CSN: [id number] Arrival date & time: 05/09/23  4782     History  Chief Complaint  Patient presents with   Pacemaker Problem    [name] is a 89 y.o. male.  89 year old male with past medical history of atrial fibrillation and ventricular tachycardia as well as CHF and hyperlipidemia presenting to the emergency department today after his ICD deployed.  This apparently happened twice this morning.  The patient states that he has been having some nausea and decreased appetite over the past few days.  He denies any cough.  Denies any chest pain.  He states that the nausea has been going on now for the past few weeks.  He came to the emergency department today for further evaluation regarding this after his defibrillator fired.  He states that he is having some nausea but denies any associated chest pain currently.        Home Medications Prior to Admission medications   Medication Sig Start Date End Date Taking? Authorizing Provider  acetaminophen (TYLENOL) 500 MG tablet Take 500 mg by mouth as needed for moderate pain (pain score 4-6), headache or mild pain (pain score 1-3).   Yes [provider]  albuterol (VENTOLIN HFA) 108 (90 Base) MCG/ACT inhaler Inhale 2 puffs into the lungs every 6 (six) hours as needed for wheezing or shortness of breath. 05/09/20  Yes [provider]  atorvastatin (LIPITOR) 40 MG tablet Take 1 tablet (40 mg total) by mouth daily. Patient taking differently: Take 40 mg by mouth in the morning. 10/08/22  Yes Turner, Cornelious Bryant, MD  ELIQUIS 2.5 MG TABS tablet TAKE 1 TABLET TWICE DAILY 07/02/22  Yes Turner, Cornelious Bryant, MD  famotidine (PEPCID) 20 MG tablet Take 20 mg by mouth every evening.   Yes [provider]  furosemide (LASIX) 20 MG tablet Take 1 tablet (20 mg total) by mouth daily. 04/19/23 04/18/24 Yes Berton Mount I, MD  isosorbide mononitrate (IMDUR) 30 MG  24 hr tablet Take 30 mg by mouth daily.   Yes [provider]  metoprolol succinate (TOPROL-XL) 25 MG 24 hr tablet Take 1 tablet (25 mg total) by mouth daily. 05/09/23  Yes Durwin Glaze, MD  mexiletine (MEXITIL) 250 MG capsule Take 1 capsule (250 mg total) by mouth every 12 (twelve) hours. 04/19/23  Yes Berton Mount I, MD  midodrine (PROAMATINE) 10 MG tablet Take 10 mg by mouth daily as needed (hypotension for sbp <100 esp before physical therapy).   Yes [provider]  Multiple Vitamin (MULTIVITAMIN WITH MINERALS) TABS tablet Take 1 tablet by mouth in the morning.   Yes [provider]  omeprazole (PRILOSEC) 20 MG capsule Take 20 mg by mouth daily before breakfast. Take one capsule one hour before breakfast.   Yes [provider]  ondansetron (ZOFRAN) 4 MG tablet Take 4 mg by mouth daily as needed for nausea, vomiting or refractory nausea / vomiting.   Yes [provider]  pantoprazole (PROTONIX) 40 MG tablet Take 40 mg by mouth daily before breakfast.   Yes [provider]  Polyethyl Glycol-Propyl Glycol (SYSTANE OP) Place 1 drop into both eyes as needed (dry/irritated eyes.).   Yes [provider]  polyethylene glycol (MIRALAX / GLYCOLAX) 17 g packet Take 17 g by mouth daily. 04/20/23  Yes Berton Mount I, MD  quiNIDine sulfate 300 MG tablet Take 300 mg by mouth in the morning and at  bedtime.   Yes [provider]  sodium bicarbonate 650 MG tablet Take 650 mg by mouth in the morning.   Yes [provider]  quiNIDine sulfate 300 MG tablet Take 300 mg by mouth in the morning, at noon, and at bedtime. Hold if SBP <100 and if HR <60.    [provider]  senna-docusate (SENOKOT-S) 8.6-50 MG tablet Take 1 tablet by mouth at bedtime as needed for mild constipation. 04/19/23   Barnetta Chapel, MD      Allergies    Ranolazine, Procaine, and Amiodarone    Review of Systems   Review of Systems   Gastrointestinal:  Positive for nausea.  All other systems reviewed and are negative.   Physical Exam Updated Vital Signs BP 131/76   Pulse (!) 105   Temp 98 F (36.7 C) (Oral)   Resp (!) 24   SpO2 97%  Physical Exam Vitals and nursing note reviewed.   Gen: NAD Eyes: PERRL, EOMI HEENT: no oropharyngeal swelling Neck: trachea midline Resp: clear to auscultation bilaterally Card: tachycardic, no murmurs, rubs, or gallops Abd: nontender, nondistended Extremities: no calf tenderness, no edema Vascular: 2+ radial pulses bilaterally, 2+ DP pulses bilaterally Neuro: no focal deficits Skin: no rashes Psyc: acting appropriately   ED Results / Procedures / Treatments   Labs (all labs ordered are listed, but only abnormal results are displayed) Labs Reviewed  BASIC METABOLIC PANEL - Abnormal; Notable for the following components:      Result Value   Sodium 132 (*)    Chloride 97 (*)    CO2 18 (*)    Glucose, Bld 124 (*)    BUN 29 (*)    Creatinine, Ser 2.28 (*)    Calcium 8.7 (*)    GFR, Estimated 27 (*)    Anion gap 17 (*)    All other components within normal limits  CBC - Abnormal; Notable for the following components:   WBC 14.6 (*)    All other components within normal limits  TROPONIN I (HIGH SENSITIVITY) - Abnormal; Notable for the following components:   Troponin I (High Sensitivity) 33 (*)    All other components within normal limits  MAGNESIUM  TROPONIN I (HIGH SENSITIVITY)    EKG EKG Interpretation Date/Time:  Thursday May 09 2023 09:56:17 EST Ventricular Rate:  112 PR Interval:    QRS Duration:  212 QT Interval:  446 QTC Calculation: 620 R Axis:   -60  Text Interpretation: Sinus tachycardia Left bundle branch block Confirmed by Beckey Downing [phone]) on 05/09/2023 10:03:22 AM  Radiology DG Chest Portable 1 View Result Date: 05/09/2023 CLINICAL DATA:  Cough. EXAM: PORTABLE CHEST 1 VIEW COMPARISON:  April 12, 2023. FINDINGS: Stable  cardiomediastinal silhouette. Left-sided defibrillator is unchanged. Status post coronary bypass graft. Mild bibasal atelectasis or edema is noted with small left pleural effusion. Bony thorax unremarkable. IMPRESSION: Mild bibasilar atelectasis or edema is noted with small left pleural effusion. Electronically Signed   By: Lupita Raider M.D.   On: 05/09/2023 11:19    Procedures Procedures    Medications Ordered in ED Medications  metoprolol succinate (TOPROL-XL) 24 hr tablet 50 mg (50 mg Oral Given 05/09/23 1352)    ED Course/ Medical Decision Making/ A&P                                 Medical Decision Making 89 year old male with past medical history  of V. tach, CHF, and hyperlipidemia presenting to the emergency department today after his defibrillator fired.  We will interrogate his pacemaker here.  Will obtain basic labs Wels magnesium level to evaluate for electrolyte abnormalities.  Given the patient's nausea will also obtain a troponin in addition to EKG to evaluate for ischemia.  I will discuss case with cardiology after his workup is complete for ultimate disposition.  The patient's labs here are largely reassuring.  On his defibrillator report it does appear that the patient to go into ventricular tachycardia and his pacemaker worked as it was supposed to.  The patient was evaluated by cardiology.  They did recommend going up on his metoprolol to 50 mg daily.  He is given a dose here in the emergency department.  They did recommend discharge with outpatient follow-up.  He is discharged with return precautions.  Amount and/or Complexity of Data Reviewed Labs: ordered. Radiology: ordered.           Final Clinical Impression(s) / ED Diagnoses Final diagnoses:  V-tach [facility])  Defibrillator discharge    Rx / DC Orders ED Discharge Orders          Ordered    metoprolol succinate (TOPROL-XL) 25 MG 24 hr tablet  Daily        05/09/23 1412              Durwin Glaze, MD 05/09/23 1413

## 2023-05-09 NOTE — ED Notes (Signed)
Merry Proud, NP at bedside.

## 2023-05-09 NOTE — ED Triage Notes (Addendum)
Pt BIB [facility] from [facility] with reports of his defibrillator firing 2 times this morning. Once at 0400 and once at 0500. Pt reports nausea. Denies chest pain.

## 2023-05-10 DIAGNOSIS — I472 Ventricular tachycardia, unspecified: Secondary | ICD-10-CM | POA: Diagnosis not present

## 2023-05-13 ENCOUNTER — Other Ambulatory Visit: Payer: Self-pay

## 2023-05-13 ENCOUNTER — Inpatient Hospital Stay (HOSPITAL_COMMUNITY)
Admission: EM | Admit: 2023-05-13 | Discharge: 2023-06-08 | DRG: 951 | Disposition: E | Payer: Medicare HMO | Source: Skilled Nursing Facility | Attending: Family Medicine | Admitting: Family Medicine

## 2023-05-13 ENCOUNTER — Emergency Department (HOSPITAL_COMMUNITY): Payer: Medicare HMO

## 2023-05-13 ENCOUNTER — Encounter (HOSPITAL_COMMUNITY): Payer: Self-pay

## 2023-05-13 DIAGNOSIS — N184 Chronic kidney disease, stage 4 (severe): Secondary | ICD-10-CM | POA: Diagnosis not present

## 2023-05-13 DIAGNOSIS — I472 Ventricular tachycardia, unspecified: Secondary | ICD-10-CM | POA: Diagnosis not present

## 2023-05-13 DIAGNOSIS — Z8249 Family history of ischemic heart disease and other diseases of the circulatory system: Secondary | ICD-10-CM

## 2023-05-13 DIAGNOSIS — I420 Dilated cardiomyopathy: Secondary | ICD-10-CM | POA: Diagnosis not present

## 2023-05-13 DIAGNOSIS — Z95810 Presence of automatic (implantable) cardiac defibrillator: Secondary | ICD-10-CM

## 2023-05-13 DIAGNOSIS — I4811 Longstanding persistent atrial fibrillation: Secondary | ICD-10-CM | POA: Diagnosis present

## 2023-05-13 DIAGNOSIS — Z9841 Cataract extraction status, right eye: Secondary | ICD-10-CM

## 2023-05-13 DIAGNOSIS — R55 Syncope and collapse: Secondary | ICD-10-CM | POA: Diagnosis not present

## 2023-05-13 DIAGNOSIS — I10 Essential (primary) hypertension: Secondary | ICD-10-CM | POA: Diagnosis not present

## 2023-05-13 DIAGNOSIS — I499 Cardiac arrhythmia, unspecified: Secondary | ICD-10-CM | POA: Diagnosis not present

## 2023-05-13 DIAGNOSIS — Z66 Do not resuscitate: Secondary | ICD-10-CM | POA: Diagnosis not present

## 2023-05-13 DIAGNOSIS — I5042 Chronic combined systolic (congestive) and diastolic (congestive) heart failure: Secondary | ICD-10-CM | POA: Diagnosis present

## 2023-05-13 DIAGNOSIS — R918 Other nonspecific abnormal finding of lung field: Secondary | ICD-10-CM | POA: Diagnosis not present

## 2023-05-13 DIAGNOSIS — N179 Acute kidney failure, unspecified: Secondary | ICD-10-CM | POA: Diagnosis present

## 2023-05-13 DIAGNOSIS — E871 Hypo-osmolality and hyponatremia: Secondary | ICD-10-CM | POA: Diagnosis not present

## 2023-05-13 DIAGNOSIS — I2510 Atherosclerotic heart disease of native coronary artery without angina pectoris: Secondary | ICD-10-CM | POA: Diagnosis present

## 2023-05-13 DIAGNOSIS — Z951 Presence of aortocoronary bypass graft: Secondary | ICD-10-CM | POA: Diagnosis not present

## 2023-05-13 DIAGNOSIS — Z888 Allergy status to other drugs, medicaments and biological substances status: Secondary | ICD-10-CM

## 2023-05-13 DIAGNOSIS — I130 Hypertensive heart and chronic kidney disease with heart failure and stage 1 through stage 4 chronic kidney disease, or unspecified chronic kidney disease: Secondary | ICD-10-CM | POA: Diagnosis not present

## 2023-05-13 DIAGNOSIS — I429 Cardiomyopathy, unspecified: Secondary | ICD-10-CM | POA: Diagnosis present

## 2023-05-13 DIAGNOSIS — Z7901 Long term (current) use of anticoagulants: Secondary | ICD-10-CM

## 2023-05-13 DIAGNOSIS — I447 Left bundle-branch block, unspecified: Secondary | ICD-10-CM | POA: Diagnosis not present

## 2023-05-13 DIAGNOSIS — Z7189 Other specified counseling: Secondary | ICD-10-CM | POA: Diagnosis not present

## 2023-05-13 DIAGNOSIS — I5084 End stage heart failure: Secondary | ICD-10-CM | POA: Diagnosis present

## 2023-05-13 DIAGNOSIS — Z9842 Cataract extraction status, left eye: Secondary | ICD-10-CM

## 2023-05-13 DIAGNOSIS — Z961 Presence of intraocular lens: Secondary | ICD-10-CM | POA: Diagnosis present

## 2023-05-13 DIAGNOSIS — K219 Gastro-esophageal reflux disease without esophagitis: Secondary | ICD-10-CM | POA: Diagnosis present

## 2023-05-13 DIAGNOSIS — E785 Hyperlipidemia, unspecified: Secondary | ICD-10-CM | POA: Diagnosis present

## 2023-05-13 DIAGNOSIS — Z515 Encounter for palliative care: Secondary | ICD-10-CM | POA: Diagnosis not present

## 2023-05-13 DIAGNOSIS — Z751 Person awaiting admission to adequate facility elsewhere: Secondary | ICD-10-CM

## 2023-05-13 DIAGNOSIS — R000 Tachycardia, unspecified: Secondary | ICD-10-CM | POA: Diagnosis not present

## 2023-05-13 DIAGNOSIS — Z953 Presence of xenogenic heart valve: Secondary | ICD-10-CM

## 2023-05-13 DIAGNOSIS — Z79899 Other long term (current) drug therapy: Secondary | ICD-10-CM

## 2023-05-13 DIAGNOSIS — I517 Cardiomegaly: Secondary | ICD-10-CM | POA: Diagnosis not present

## 2023-05-13 LAB — BASIC METABOLIC PANEL
Anion gap: 15 (ref 5–15)
BUN: 34 mg/dL — ABNORMAL HIGH (ref 8–23)
CO2: 18 mmol/L — ABNORMAL LOW (ref 22–32)
Calcium: 8.4 mg/dL — ABNORMAL LOW (ref 8.9–10.3)
Chloride: 98 mmol/L (ref 98–111)
Creatinine, Ser: 2.65 mg/dL — ABNORMAL HIGH (ref 0.61–1.24)
GFR, Estimated: 22 mL/min — ABNORMAL LOW (ref >60)
Glucose, Bld: 131 mg/dL — ABNORMAL HIGH (ref 70–99)
Potassium: 4.9 mmol/L (ref 3.5–5.1)
Sodium: 131 mmol/L — ABNORMAL LOW (ref 135–145)

## 2023-05-13 LAB — MAGNESIUM: Magnesium: 2.3 mg/dL (ref 1.7–2.4)

## 2023-05-13 LAB — CBC WITH DIFFERENTIAL/PLATELET
Abs Immature Granulocytes: 0.06 10*3/uL (ref 0.00–0.07)
Basophils Absolute: 0.1 10*3/uL (ref 0.0–0.1)
Basophils Relative: 1 %
Eosinophils Absolute: 0.1 10*3/uL (ref 0.0–0.5)
Eosinophils Relative: 1 %
HCT: 43.2 % (ref 39.0–52.0)
Hemoglobin: 13.9 g/dL (ref 13.0–17.0)
Immature Granulocytes: 1 %
Lymphocytes Relative: 10 %
Lymphs Abs: 1.1 10*3/uL (ref 0.7–4.0)
MCH: 28.7 pg (ref 26.0–34.0)
MCHC: 32.2 g/dL (ref 30.0–36.0)
MCV: 89.1 fL (ref 80.0–100.0)
Monocytes Absolute: 1.7 10*3/uL — ABNORMAL HIGH (ref 0.1–1.0)
Monocytes Relative: 16 %
Neutro Abs: 8 10*3/uL — ABNORMAL HIGH (ref 1.7–7.7)
Neutrophils Relative %: 71 %
Platelets: 266 10*3/uL (ref 150–400)
RBC: 4.85 MIL/uL (ref 4.22–5.81)
RDW: 15.5 % (ref 11.5–15.5)
WBC: 11 10*3/uL — ABNORMAL HIGH (ref 4.0–10.5)
nRBC: 0 % (ref 0.0–0.2)

## 2023-05-13 LAB — TROPONIN I (HIGH SENSITIVITY)
Troponin I (High Sensitivity): 37 ng/L — ABNORMAL HIGH (ref <18)
Troponin I (High Sensitivity): 50 ng/L — ABNORMAL HIGH (ref <18)

## 2023-05-13 MED ORDER — ACETAMINOPHEN 500 MG PO TABS: 500.0000 mg | ORAL_TABLET | ORAL | Status: DC | PRN

## 2023-05-13 MED ORDER — METOPROLOL SUCCINATE ER 25 MG PO TB24
25.0000 mg | ORAL_TABLET | Freq: Every day | ORAL | Status: DC
  Administered 2023-05-13 – 2023-05-14 (×2): 25 mg via ORAL
  Filled 2023-05-13 (×2): qty 1

## 2023-05-13 MED ORDER — PROCHLORPERAZINE EDISYLATE 10 MG/2ML IJ SOLN: 10.0000 mg | Freq: Four times a day (QID) | INTRAMUSCULAR | Status: DC | PRN

## 2023-05-13 MED ORDER — ATORVASTATIN CALCIUM 40 MG PO TABS
40.0000 mg | ORAL_TABLET | Freq: Every day | ORAL | Status: DC
  Administered 2023-05-13: 40 mg via ORAL
  Filled 2023-05-13 (×2): qty 1

## 2023-05-13 MED ORDER — HYDRALAZINE HCL 20 MG/ML IJ SOLN: 10.0000 mg | Freq: Four times a day (QID) | INTRAMUSCULAR | Status: DC | PRN

## 2023-05-13 MED ORDER — ALBUTEROL SULFATE (2.5 MG/3ML) 0.083% IN NEBU
2.5000 mg | INHALATION_SOLUTION | Freq: Four times a day (QID) | RESPIRATORY_TRACT | Status: DC | PRN
  Administered 2023-05-13: 2.5 mg via RESPIRATORY_TRACT
  Filled 2023-05-13: qty 3

## 2023-05-13 MED ORDER — ACETAMINOPHEN 500 MG PO TABS: 500.0000 mg | ORAL_TABLET | Freq: Four times a day (QID) | ORAL | Status: DC | PRN

## 2023-05-13 MED ORDER — PANTOPRAZOLE SODIUM 40 MG PO TBEC
40.0000 mg | DELAYED_RELEASE_TABLET | Freq: Every day | ORAL | Status: DC
  Administered 2023-05-13 – 2023-05-14 (×2): 40 mg via ORAL
  Filled 2023-05-13 (×2): qty 1

## 2023-05-13 MED ORDER — BISACODYL 5 MG PO TBEC: 5.0000 mg | DELAYED_RELEASE_TABLET | Freq: Every day | ORAL | Status: DC | PRN

## 2023-05-13 MED ORDER — ACETAMINOPHEN 650 MG RE SUPP: 650.0000 mg | Freq: Four times a day (QID) | RECTAL | Status: DC | PRN

## 2023-05-13 MED ORDER — MEXILETINE HCL 250 MG PO CAPS
250.0000 mg | ORAL_CAPSULE | Freq: Two times a day (BID) | ORAL | Status: DC
  Administered 2023-05-13 – 2023-05-14 (×2): 250 mg via ORAL
  Filled 2023-05-13 (×4): qty 1

## 2023-05-13 MED ORDER — POLYETHYLENE GLYCOL 3350 17 G PO PACK: 17.0000 g | PACK | Freq: Every day | ORAL | Status: DC | PRN

## 2023-05-13 MED ORDER — SODIUM BICARBONATE 650 MG PO TABS
650.0000 mg | ORAL_TABLET | Freq: Every morning | ORAL | Status: DC
  Administered 2023-05-13 – 2023-05-14 (×2): 650 mg via ORAL
  Filled 2023-05-13 (×2): qty 1

## 2023-05-13 MED ORDER — FAMOTIDINE 20 MG PO TABS
20.0000 mg | ORAL_TABLET | Freq: Every evening | ORAL | Status: DC
  Administered 2023-05-13: 20 mg via ORAL
  Filled 2023-05-13: qty 1

## 2023-05-13 MED ORDER — APIXABAN 2.5 MG PO TABS
2.5000 mg | ORAL_TABLET | Freq: Two times a day (BID) | ORAL | Status: DC
  Administered 2023-05-13 – 2023-05-14 (×3): 2.5 mg via ORAL
  Filled 2023-05-13 (×4): qty 1

## 2023-05-13 MED ORDER — ACETAMINOPHEN 325 MG PO TABS: 650.0000 mg | ORAL_TABLET | Freq: Four times a day (QID) | ORAL | Status: DC | PRN

## 2023-05-13 NOTE — H&P (Signed)
[facility] History and Physical  [name] UJW:[id number] DOB: Feb 04, 1933 DOA: 05/13/2023 PCP: [name] Rua, MD  Presented from: SNF Chief Complaint: Syncope   History of Present Illness: [name] is a 89 y.o. male with PMH significant for HTN, HLD, CAD, A-fib on Eliquis, AS s/p bioprosthetic AVR, systolic CHF, V. Tach, recent VT storm requiring ATP. Most recent hospitalization 1/3 to 04/22/2023 for nausea, poor appetite and fatigue which is suspected to be related to amiodarone.  Per cardiology recommendation, [facility]  Per family, patient has not been able to participate with therapy there and has been declining.  Per chart review, it was noted that patient was having recurrent syncope on any attempt to sit or stand.  Family also wanted to discontinue quinidine as they suspected it to be making his nausea worse.  Following that, he experienced multiple defibrillation shocks. He was in process of getting switched to long-term care.  Hospice referral was made as well. 1/30, patient was brought to the ED after 2 defibrillation shocks.  Cardiology saw the patient and recommended to turn off the ICD but patient refused.  Metoprolol dose was increased and patient was subsequent discharged back to SNF.  This morning, patient was brought to the ED after he had another episode of defibrillator going off.  It went off a total of 5 times in about 10 to 15 minutes.  Patient felt weak but no specific complaint of chest pain or shortness of breath.  In the ED, he was tachycardic to 110s, blood pressure stable Labs with BUN/creatinine 34/2.65 which is higher than baseline creatinine 2.28, troponin elevated to 50.  Seen by EP.  Patient has agreed to deactivate the defibrillator as well as initiating hospice services Hospitalist service was consulted for in-house management Palliative consulted as well  At the time of my  evaluation, patient was lying on bed.  Alert, awake, slow to respond but oriented to place and person.  [name] was at bedside. History reviewed and detailed as above. Patient reports that he has been able to eat only yogurt for last several weeks.   Review of Systems:  All systems were reviewed and were negative unless otherwise mentioned in the HPI   Past medical history: Past Medical History:  Diagnosis Date   Arthritis    "minor everywhere" (04/22/2015)   Asthma    "a touch"   Barrett esophagus    CAD (coronary artery disease)    a. s/p 3 vvessel CABG with LIMA to LAD, SVG to diagonal 1, SVG to RCA 12/09.   Cardiac resynchronization therapy defibrillator (CRT-D) in place    Chronic anticoagulation 01/23/2013   Chronic combined systolic and diastolic CHF (congestive heart failure) (HCC)    dry weight 197-200lbs.   CKD (chronic kidney disease) stage 3, GFR 30-59 ml/min (HCC) 08/23/2014   Complication of anesthesia    DCM (dilated cardiomyopathy) (HCC)    initially concerned for TTP amyloidosis but Tc-PYP scan 06/2017 was not consistent with amyloid and SPEP/UPEP with no M spike. EF 20-25% by echo 2019   Diverticulitis 08/2019   Dyslipidemia 01/23/2013   Essential hypertension 01/23/2013   GERD (gastroesophageal reflux disease)    GI bleed 05/08/2017   H/O: rheumatic fever    Hepatitis 1957   "don't know what kind:   History of hiatal hernia    History of PFTs    a. Amiodarone started 5/16 >> PFTs w/ DLCO 6/16:  FEV1  72% predicted, FEV1/FVC 66%, DLCO 66% >> minimal reversible obstructive airways disease with mild diffusion defect (suggestive of emphysema but absence of hyperinflation inconsistent with dx)   Hyperkalemia 08/23/2014   Hyperthyroidism 05/26/2017   Leg weakness 01/28/2017   Multiple thyroid nodules 08/13/2017   Orthostatic hypotension    Persistent atrial fibrillation (HCC) 01/23/2013   Pleural effusion on right 05/26/2017   Pneumonia 08/2014   "dr thought I  may have had a touch"   Pre-diabetes    Protein-calorie malnutrition, severe 05/27/2017   S/P AVR (aortic valve replacement) 2009   a. severe AS s/p AVR with pericardial tissue valve 2009.   Shingles 08/23/2014   VT (ventricular tachycardia) (HCC) 05/06/2017    Past surgical history: Past Surgical History:  Procedure Laterality Date   AORTIC VALVE REPLACEMENT  with 23-mm Magna Ease pericardial valve, model number   with 23-mm Magna Ease pericardial valve, model [id number], serial number [id number]    APPENDECTOMY  1940s   BI-VENTRICULAR IMPLANTABLE CARDIOVERTER DEFIBRILLATOR  (CRT-D)  04/22/2015   BIV ICD GENERATOR CHANGEOUT N/A 09/18/2022   Procedure: BIV ICD GENERATOR CHANGEOUT;  Surgeon: Regan Lemming, MD;  Location: [facility] INVASIVE CV LAB;  Service: Cardiovascular;  Laterality: N/A;   CARDIAC CATHETERIZATION N/A 11/04/2014   Procedure: Left Heart Cath and Cors/Grafts Angiography;  Surgeon: Marykay Lex, MD;  Location: [facility] INVASIVE CV LAB;  Service: Cardiovascular;  Laterality: N/A;   CARDIAC VALVE REPLACEMENT     CARDIOVERSION N/A 01/27/2013   Procedure: CARDIOVERSION;  Surgeon: Quintella Reichert, MD;  Location: [facility] ENDOSCOPY;  Service: Cardiovascular;  Laterality: N/A;   CARDIOVERSION N/A 08/25/2014   Procedure: CARDIOVERSION;  Surgeon: Thurmon Fair, MD;  Location: [facility] ENDOSCOPY;  Service: Cardiovascular;  Laterality: N/A;   CARDIOVERSION N/A 05/28/2018   Procedure: CARDIOVERSION;  Surgeon: Chrystie Nose, MD;  Location: [facility] ENDOSCOPY;  Service: Cardiovascular;  Laterality: N/A;   CARDIOVERSION N/A 10/28/2019   Procedure: CARDIOVERSION;  Surgeon: Quintella Reichert, MD;  Location: [facility] ENDOSCOPY;  Service: Cardiovascular;  Laterality: N/A;   CARDIOVERSION N/A 09/01/2020   Procedure: CARDIOVERSION;  Surgeon: Lewayne Bunting, MD;  Location: [facility] ENDOSCOPY;  Service: Cardiovascular;  Laterality: N/A;   CARDIOVERSION N/A 04/04/2022   Procedure: CARDIOVERSION;  Surgeon: Chrystie Nose, MD;   Location: [facility] ENDOSCOPY;  Service: Cardiovascular;  Laterality: N/A;   CATARACT EXTRACTION W/ INTRAOCULAR LENS  IMPLANT, BILATERAL Bilateral    CHEST TUBE INSERTION Right 07/26/2017   Procedure: INSERTION PLEURAL DRAINAGE CATHETER - RIGHT;  Surgeon: Kerin Perna, MD;  Location: [facility] OR;  Service: Thoracic;  Laterality: Right;   CORONARY ARTERY BYPASS GRAFT  03/10/2008   x 3 Dr. Dorris Fetch   EP IMPLANTABLE DEVICE N/A 04/22/2015   Procedure: BiV ICD Insertion CRT-D;  Surgeon: Will Jorja Loa, MD;  Location: [facility] INVASIVE CV LAB;  Service: Cardiovascular;  Laterality: N/A;   ESOPHAGOGASTRODUODENOSCOPY (EGD) WITH ESOPHAGEAL DILATION  X1   ESOPHAGOGASTRODUODENOSCOPY (EGD) WITH PROPOFOL N/A 05/09/2017   Procedure: ESOPHAGOGASTRODUODENOSCOPY (EGD) WITH PROPOFOL;  Surgeon: Jeani Hawking, MD;  Location: [facility] ENDOSCOPY;  Service: Endoscopy;  Laterality: N/A;   IR THORACENTESIS ASP PLEURAL SPACE W/IMG GUIDE  05/27/2017   IR THORACENTESIS ASP PLEURAL SPACE W/IMG GUIDE  06/17/2017   IR THORACENTESIS ASP PLEURAL SPACE W/IMG GUIDE  06/18/2017   IR THORACENTESIS ASP PLEURAL SPACE W/IMG GUIDE  07/11/2017   REMOVAL OF PLEURAL DRAINAGE CATHETER Right 09/13/2017   Procedure: REMOVAL OF PLEURAL DRAINAGE CATHETER;  Surgeon: Kerin Perna, MD;  Location: [facility] OR;  Service: Thoracic;  Laterality: Right;   RIGHT HEART CATH AND CORONARY/GRAFT ANGIOGRAPHY N/A 05/31/2017   Procedure: RIGHT HEART CATH AND CORONARY/GRAFT ANGIOGRAPHY;  Surgeon: Dolores Patty, MD;  Location: [facility] INVASIVE CV LAB;  Service: Cardiovascular;  Laterality: N/A;   TONSILLECTOMY      Social History:  reports that he has never smoked. He has never used smokeless tobacco. He reports that he does not currently use alcohol. He reports that he does not use drugs.  Allergies:  Allergies  Allergen Reactions   Ranolazine Other (See Comments)    States he went into a coma and was in hospital for 3 days after taking   Procaine Swelling and Other (See  Comments)   Amiodarone Nausea And Vomiting and Other (See Comments)   Ranolazine, Procaine, and Amiodarone   Family history:  Family History  Problem Relation Age of Onset   Alzheimer's disease Mother    COPD Daughter    Alzheimer's disease Other    COPD Sister    Depression Sister    Heart disease Sister    Hyperlipidemia Sister    Hypertension Sister    Esophageal cancer Sister        + smoker     Physical Exam: Vitals:   05/13/23 0900 05/13/23 0930 05/13/23 1000 05/13/23 1030  BP: 125/71 110/70 115/73 118/75  Pulse: (!) 108 (!) 107 (!) 107 (!) 109  Resp: 17 20 14  (!) 22  Temp:      TempSrc:      SpO2: 95% 97% 97% 97%  Weight:      Height:       Wt Readings from Last 3 Encounters:  05/13/23 82.6 kg  05/01/23 82.6 kg  04/22/23 86 kg   Body mass index is 24.68 kg/m.  General exam: Pleasant, elderly, not in distress currently Skin: No rashes, lesions or ulcers. HEENT: Atraumatic, normocephalic, no obvious bleeding Lungs: Clear to auscultation bilaterally,  CVS: S1, S2, no murmur,   GI/Abd: Soft, nontender, nondistended, bowel sound present,   CNS: Alert, awake, hard of hearing, oriented to place and person Psychiatry: Mood appropriate,  Extremities: No pedal edema, no calf tenderness,    ----------------------------------------------------------------------------------------------------------------------------------------- ----------------------------------------------------------------------------------------------------------------------------------------- -----------------------------------------------------------------------------------------------------------------------------------------  Assessment/Plan: Recurrent VT storm Multiple shocks from defibrillator Seen by EP Noted a plan to deactivate the pacemaker, continue mexiletine to 50 mg twice daily and Toprol 25 mg daily Target potassium more than 4 and magnesium more than 2 Palliative care  consulted for hospice discussion Recent Labs  Lab 05/09/23 0950 05/13/23 0449  K 4.8 4.9  MG 2.1 2.3   Persistent A-fib Eliquis 2.5 mg twice daily to continue  Chronic systolic CHF HTN Echo from January with EF 20 to 25% PTA meds- metoprolol, Imdur, Lasix, midodrine. Poor appetite.  Looks dehydrated. Continue metoprolol.  Keep others on hold for now.   CAD HLD Eliquis and statin to continue  AKI on CKD 4 Creatinine elevated 2.65, baseline 2.19 from a month ago Likely due to poor appetite and Lasix. I would avoid IV fluid but hold Lasix and encourage oral hydration Continue sodium bicarb Recent Labs    04/01/23 0355 04/02/23 0415 04/12/23 1729 04/13/23 0238 04/14/23 0304 04/15/23 0232 04/16/23 0318 04/17/23 0344 05/09/23 0950 05/13/23 0449  BUN 42* 41* 26* 24* 25* 23 27* 33* 29* 34*  CREATININE 2.41* 2.27* 2.15* 2.24* 2.27* 1.94* 2.00* 2.19* 2.28* 2.65*   GERD PPI  Mobility: Encourage ambulation  Goals of care:   Code Status: Limited: Do not attempt resuscitation (  DNR) -DNR-LIMITED -Do Not Intubate/DNI   Palliative care involved   DVT prophylaxis: Eliquis   Antimicrobials: None Fluid: None Consultants: Cardiology Family Communication: Grandson at bedside  Dispo: The patient is from: SNF              Anticipated d/c is to: Pending clinical course  Diet: Diet Order     None        ------------------------------------------------------------------------------------- Severity of Illness: The appropriate patient status for this patient is OBSERVATION. Observation status is judged to be reasonable and necessary in order to provide the required intensity of service to ensure the patient's safety. The patient's presenting symptoms, physical exam findings, and initial radiographic and laboratory data in the context of their medical condition is felt to place them at decreased risk for further clinical deterioration. Furthermore, it is anticipated that the  patient will be medically stable for discharge from the hospital within 2 midnights of admission.  -------------------------------------------------------------------------------------   Home Meds: Prior to Admission medications   Medication Sig Start Date End Date Taking? Authorizing Provider  acetaminophen (TYLENOL) 500 MG tablet Take 500 mg by mouth as needed for moderate pain (pain score 4-6), headache or mild pain (pain score 1-3).   Yes [provider]  albuterol (VENTOLIN HFA) 108 (90 Base) MCG/ACT inhaler Inhale 2 puffs into the lungs every 6 (six) hours as needed for wheezing or shortness of breath. 05/09/20  Yes [provider]  atorvastatin (LIPITOR) 40 MG tablet Take 1 tablet (40 mg total) by mouth daily. Patient taking differently: Take 40 mg by mouth in the morning. 10/08/22  Yes Turner, Cornelious Bryant, MD  ELIQUIS 2.5 MG TABS tablet TAKE 1 TABLET TWICE DAILY 07/02/22  Yes Turner, Cornelious Bryant, MD  famotidine (PEPCID) 20 MG tablet Take 20 mg by mouth every evening.   Yes [provider]  furosemide (LASIX) 20 MG tablet Take 1 tablet (20 mg total) by mouth daily. Patient taking differently: Take 40 mg by mouth in the morning. 04/19/23 04/18/24 Yes Barnetta Chapel, MD  isosorbide mononitrate (IMDUR) 30 MG 24 hr tablet Take 30 mg by mouth in the morning.   Yes [provider]  metoprolol succinate (TOPROL-XL) 25 MG 24 hr tablet Take 1 tablet (25 mg total) by mouth daily. 05/09/23  Yes Durwin Glaze, MD  mexiletine (MEXITIL) 250 MG capsule Take 1 capsule (250 mg total) by mouth every 12 (twelve) hours. Patient taking differently: Take 250 mg by mouth 2 (two) times daily. 04/19/23  Yes Berton Mount I, MD  midodrine (PROAMATINE) 10 MG tablet Take 10 mg by mouth daily as needed (hypotension for sbp <100 esp before physical therapy).   Yes [provider]  Multiple Vitamin (MULTIVITAMIN WITH MINERALS) TABS tablet Take 1 tablet by mouth in the morning.   Yes  [provider]  omeprazole (PRILOSEC) 20 MG capsule Take 20 mg by mouth daily before breakfast. Take one capsule one hour before breakfast.   Yes [provider]  ondansetron (ZOFRAN) 4 MG tablet Take 4 mg by mouth daily as needed for nausea, vomiting or refractory nausea / vomiting.   Yes [provider]  Polyethyl Glycol-Propyl Glycol (SYSTANE OP) Place 1 drop into both eyes as needed (dry/irritated eyes.).   Yes [provider]  polyethylene glycol (MIRALAX / GLYCOLAX) 17 g packet Take 17 g by mouth daily. 04/20/23  Yes Berton Mount I, MD  senna-docusate (SENOKOT-S) 8.6-50 MG tablet Take 1 tablet by mouth at bedtime as needed  for mild constipation. 04/19/23  Yes Berton Mount I, MD  sodium bicarbonate 650 MG tablet Take 650 mg by mouth in the morning.   Yes [provider]  pantoprazole (PROTONIX) 40 MG tablet Take 40 mg by mouth in the morning.    [provider]  quiNIDine sulfate 300 MG tablet Take 300 mg by mouth in the morning and at bedtime.    [provider]    Labs on Admission:   CBC: Recent Labs  Lab 05/09/23 0950 05/13/23 0449  WBC 14.6* 11.0*  NEUTROABS  --  8.0*  HGB 14.6 13.9  HCT 46.5 43.2  MCV 91.4 89.1  PLT 314 266    Basic Metabolic Panel: Recent Labs  Lab 05/09/23 0950 05/13/23 0449  NA 132* 131*  K 4.8 4.9  CL 97* 98  CO2 18* 18*  GLUCOSE 124* 131*  BUN 29* 34*  CREATININE 2.28* 2.65*  CALCIUM 8.7* 8.4*  MG 2.1 2.3    Liver Function Tests: No results for input(s): "AST", "ALT", "ALKPHOS", "BILITOT", "PROT", "ALBUMIN" in the last 168 hours. No results for input(s): "LIPASE", "AMYLASE" in the last 168 hours. No results for input(s): "AMMONIA" in the last 168 hours.  Cardiac Enzymes: No results for input(s): "CKTOTAL", "CKMB", "CKMBINDEX", "TROPONINI" in the last 168 hours.  BNP (last 3 results) Recent Labs    04/12/23 1639  BNP 597.6*    ProBNP (last 3 results) No  results for input(s): "PROBNP" in the last 8760 hours.  CBG: No results for input(s): "GLUCAP" in the last 168 hours.  Lipase     Component Value Date/Time   LIPASE 22 09/01/2019 1835     Urinalysis    Component Value Date/Time   COLORURINE YELLOW 04/14/2023 1130   APPEARANCEUR CLEAR 04/14/2023 1130   LABSPEC 1.017 04/14/2023 1130   PHURINE 5.0 04/14/2023 1130   GLUCOSEU NEGATIVE 04/14/2023 1130   HGBUR NEGATIVE 04/14/2023 1130   BILIRUBINUR NEGATIVE 04/14/2023 1130   KETONESUR NEGATIVE 04/14/2023 1130   PROTEINUR NEGATIVE 04/14/2023 1130   UROBILINOGEN 0.2 03/08/2008 1519   NITRITE NEGATIVE 04/14/2023 1130   LEUKOCYTESUR NEGATIVE 04/14/2023 1130     Drugs of Abuse  No results found for: "LABOPIA", "COCAINSCRNUR", "LABBENZ", "AMPHETMU", "THCU", "LABBARB"    Radiological Exams on Admission: DG Chest Port 1 View Result Date: 05/13/2023 CLINICAL DATA:  AICD problem EXAM: PORTABLE CHEST 1 VIEW COMPARISON:  05/09/2023 FINDINGS: Unchanged positioning of biventricular pacer. Stable cardiac enlargement. Prior CABG. Hazy and streaky density in the lower lungs is similar to prior. Small left pleural effusion suggested. No pneumothorax. IMPRESSION: 1. Unchanged infiltrate or edema the lung bases with left pleural effusion. 2. Stable positioning of AICD leads. Electronically Signed   By: Tiburcio Pea M.D.   On: 05/13/2023 05:19     Signed, Lorin Glass, MD [facility] 05/13/2023

## 2023-05-13 NOTE — ED Provider Notes (Signed)
EMERGENCY DEPARTMENT AT [facility] Provider Note   CSN: [id number] Arrival date & time: 05/13/23  1914     History  Chief Complaint  Patient presents with   AICD Problem    [name] is a 89 y.o. male.  Patient was awakened from sleep by his AICD firing.  He lives in a nursing home, called nursing home staff.  While they were waiting for EMS, the ICD fired 5 times.  Patient reports that he feels weak but no specific chest pain.  No palpitations or shortness of breath.  Patient was admitted 2 days ago with similar problems.       Home Medications Prior to Admission medications   Medication Sig Start Date End Date Taking? Authorizing Provider  acetaminophen (TYLENOL) 500 MG tablet Take 500 mg by mouth as needed for moderate pain (pain score 4-6), headache or mild pain (pain score 1-3).    [provider]  albuterol (VENTOLIN HFA) 108 (90 Base) MCG/ACT inhaler Inhale 2 puffs into the lungs every 6 (six) hours as needed for wheezing or shortness of breath. 05/09/20   [provider]  atorvastatin (LIPITOR) 40 MG tablet Take 1 tablet (40 mg total) by mouth daily. Patient taking differently: Take 40 mg by mouth in the morning. 10/08/22   Quintella Reichert, MD  ELIQUIS 2.5 MG TABS tablet TAKE 1 TABLET TWICE DAILY 07/02/22   Quintella Reichert, MD  famotidine (PEPCID) 20 MG tablet Take 20 mg by mouth every evening.    [provider]  furosemide (LASIX) 20 MG tablet Take 1 tablet (20 mg total) by mouth daily. 04/19/23 04/18/24  Berton Mount I, MD  isosorbide mononitrate (IMDUR) 30 MG 24 hr tablet Take 30 mg by mouth daily.    [provider]  metoprolol succinate (TOPROL-XL) 25 MG 24 hr tablet Take 1 tablet (25 mg total) by mouth daily. 05/09/23   Durwin Glaze, MD  mexiletine (MEXITIL) 250 MG capsule Take 1 capsule (250 mg total) by mouth every 12 (twelve) hours. 04/19/23   Barnetta Chapel, MD  midodrine (PROAMATINE) 10 MG tablet  Take 10 mg by mouth daily as needed (hypotension for sbp <100 esp before physical therapy).    [provider]  Multiple Vitamin (MULTIVITAMIN WITH MINERALS) TABS tablet Take 1 tablet by mouth in the morning.    [provider]  omeprazole (PRILOSEC) 20 MG capsule Take 20 mg by mouth daily before breakfast. Take one capsule one hour before breakfast.    [provider]  ondansetron (ZOFRAN) 4 MG tablet Take 4 mg by mouth daily as needed for nausea, vomiting or refractory nausea / vomiting.    [provider]  pantoprazole (PROTONIX) 40 MG tablet Take 40 mg by mouth daily before breakfast.    [provider]  Polyethyl Glycol-Propyl Glycol (SYSTANE OP) Place 1 drop into both eyes as needed (dry/irritated eyes.).    [provider]  polyethylene glycol (MIRALAX / GLYCOLAX) 17 g packet Take 17 g by mouth daily. 04/20/23   Berton Mount I, MD  quiNIDine sulfate 300 MG tablet Take 300 mg by mouth in the morning, at noon, and at bedtime. Hold if SBP <100 and if HR <60.    [provider]  quiNIDine sulfate 300 MG tablet Take 300 mg by mouth in the morning and at bedtime.    [provider]  senna-docusate (SENOKOT-S) 8.6-50 MG tablet Take 1 tablet by mouth at bedtime  as needed for mild constipation. 04/19/23   Berton Mount I, MD  sodium bicarbonate 650 MG tablet Take 650 mg by mouth in the morning.    [provider]      Allergies    Ranolazine, Procaine, and Amiodarone    Review of Systems   Review of Systems  Physical Exam Updated Vital Signs BP 119/69   Pulse (!) 111   Temp 98.5 F (36.9 C) (Oral)   Resp 17   Ht 6' (1.829 m)   Wt 82.6 kg   SpO2 96%   BMI 24.68 kg/m  Physical Exam Vitals and nursing note reviewed.  Constitutional:      General: He is not in acute distress.    Appearance: He is well-developed.  HENT:     Head: Normocephalic and atraumatic.     Mouth/Throat:     Mouth: Mucous  membranes are moist.  Eyes:     General: Vision grossly intact. Gaze aligned appropriately.     Extraocular Movements: Extraocular movements intact.     Conjunctiva/sclera: Conjunctivae normal.  Cardiovascular:     Rate and Rhythm: Normal rate and regular rhythm.     Pulses: Normal pulses.     Heart sounds: Normal heart sounds, S1 normal and S2 normal. No murmur heard.    No friction rub. No gallop.  Pulmonary:     Effort: Pulmonary effort is normal. No respiratory distress.     Breath sounds: Normal breath sounds.  Abdominal:     Palpations: Abdomen is soft.     Tenderness: There is no abdominal tenderness. There is no guarding or rebound.     Hernia: No hernia is present.  Musculoskeletal:        General: No swelling.     Cervical back: Full passive range of motion without pain, normal range of motion and neck supple. No pain with movement, spinous process tenderness or muscular tenderness. Normal range of motion.     Right lower leg: No edema.     Left lower leg: No edema.  Skin:    General: Skin is warm and dry.     Capillary Refill: Capillary refill takes less than 2 seconds.     Findings: No ecchymosis, erythema, lesion or wound.  Neurological:     Mental Status: He is alert and oriented to person, place, and time.     GCS: GCS eye subscore is 4. GCS verbal subscore is 5. GCS motor subscore is 6.     Cranial Nerves: Cranial nerves 2-12 are intact.     Sensory: Sensation is intact.     Motor: Motor function is intact. No weakness or abnormal muscle tone.     Coordination: Coordination is intact.  Psychiatric:        Mood and Affect: Mood normal.        Speech: Speech normal.        Behavior: Behavior normal.     ED Results / Procedures / Treatments   Labs (all labs ordered are listed, but only abnormal results are displayed) Labs Reviewed  CBC WITH DIFFERENTIAL/PLATELET - Abnormal; Notable for the following components:      Result Value   WBC 11.0 (*)    Neutro Abs  8.0 (*)    Monocytes Absolute 1.7 (*)    All other components within normal limits  BASIC METABOLIC PANEL - Abnormal; Notable for the following components:   Sodium 131 (*)    CO2 18 (*)    Glucose,  Bld 131 (*)    BUN 34 (*)    Creatinine, Ser 2.65 (*)    Calcium 8.4 (*)    GFR, Estimated 22 (*)    All other components within normal limits  TROPONIN I (HIGH SENSITIVITY) - Abnormal; Notable for the following components:   Troponin I (High Sensitivity) 37 (*)    All other components within normal limits  MAGNESIUM  TROPONIN I (HIGH SENSITIVITY)    EKG None  Radiology DG Chest Port 1 View Result Date: 05/13/2023 CLINICAL DATA:  AICD problem EXAM: PORTABLE CHEST 1 VIEW COMPARISON:  05/09/2023 FINDINGS: Unchanged positioning of biventricular pacer. Stable cardiac enlargement. Prior CABG. Hazy and streaky density in the lower lungs is similar to prior. Small left pleural effusion suggested. No pneumothorax. IMPRESSION: 1. Unchanged infiltrate or edema the lung bases with left pleural effusion. 2. Stable positioning of AICD leads. Electronically Signed   By: Tiburcio Pea M.D.   On: 05/13/2023 05:19    Procedures Procedures    Medications Ordered in ED Medications - No data to display  ED Course/ Medical Decision Making/ A&P                                 Medical Decision Making Amount and/or Complexity of Data Reviewed Labs: ordered. Radiology: ordered.   89 y.o. male with a history of permanent AF, sCHF, VT status post ICD, CAD s/p CABG with AVR, presents after receiving 5 shocks from his AICD.  Patient was just hospitalized 2 days ago for same.  Reviewing cardiology EP notes, patient does not tolerate many antiarrhythmics, they do not have many options for adjustments.  He had some settings change on his AICD and his metoprolol was increased.  Here in the ED, patient has been stable.  No further shocks here in the emergency department.  Lab work is at his baseline.  Will  ask cardiology to see patient for recommendations.        Final Clinical Impression(s) / ED Diagnoses Final diagnoses:  V-tach [facility])    Rx / DC Orders ED Discharge Orders     None         Andray Assefa, Canary Brim, MD 05/13/23 [phone]

## 2023-05-13 NOTE — Consult Note (Signed)
ELECTROPHYSIOLOGY CONSULT NOTE    Patient ID: [name] MRN: [id number], DOB/AGE: 05-04-1932 89 y.o.  Admit date: 05/13/2023 Date of Consult: 05/13/2023  Primary Physician: Joycelyn Rua, MD Primary Cardiologist: Armanda Magic, MD  Electrophysiologist: Dr. Elberta Fortis   Referring Provider: Dr. Jeraldine Loots  Patient Profile: [name] is a 89 y.o. male with a history of longstanding persistent AF, sCHF, VT status post ICD, CAD s/p CABG with AVR who is being seen today for the evaluation of VT and ICD shock at the request of Dr. Jeraldine Loots.   HPI:  [name] is a 89 y.o. male well known to the EP service with multiple VT admission over the past several months and goals of care conversation.   Pt has failed quinidine and re-challenge of amiodarone. He is tolerating mexitil, but has continued to have VT with ICD therapies.   He has failed rehab, as he is weak, SOB, and near syncope with any attempts to rehab.   Seen in ED 05/09/2023 for VT with ICD shock. Preferred to leave therapies on at that time. Toprol increased. Sent back to rehab at pts request.  He has felt poorly all weekend and presented again today with a total of 7 ICD shocks for VT in the 120s and 140s, with VT zones set to deliver therapy as early as 115 bpm.      Pt relays that he is miserable, and wishes he had turned his ICD off "months ago".  He understands that doing so will likely mean he goes into VT, and remains there. His prognosis will depend entirely on how he tolerates that.    He has NYHA IIIb-IV symptoms with dizziness, SOB, fatigue, and near syncope with standing.   He verbalizes understanding that he has exhausted medical options. He is agreeable to hospice/palliative care services.   Labs Potassium4.9 (02/03 0449) Magnesium  2.3 (02/03 0449) Creatinine, ser  2.65* (02/03 0449) PLT  266 (02/03 0449) HGB  13.9 (02/03 0449) WBC 11.0* (02/03 0449) Troponin I (High Sensitivity)50* (02/03 7829).     Allergies, Medical, Surgical, Social, and Family Histories have been reviewed and are referenced here-in when relevant for medical decision making.    Physical Exam: Vitals:   05/13/23 0515 05/13/23 0545 05/13/23 0745 05/13/23 0830  BP: 117/70 119/69 117/72 121/78  Pulse: (!) 112 (!) 111 (!) 107 (!) 110  Resp: 18 17 18 18   Temp:    98.4 F (36.9 C)  TempSrc:    Oral  SpO2: 97% 96% 97% 97%  Weight:      Height:        GEN-  Chronically ill appearing.  HEENT: Normocephalic, atraumatic Lungs- CTAB, Normal effort.  Heart- Tachy but mostly regular rate and rhythm, Semi-frequent ectopy.   GI- Soft, NT, ND.  Extremities- No clubbing or cyanosis. At least trace to 1+ edema   Radiology/Studies:   EKG:on arrival shows likely ST with RVR at 111 bpm (personally reviewed)  TELEMETRY: Sinus tachy with intermittent PVCs and slow NSVT (personally reviewed)  DEVICE HISTORY: MDT CRT-D, implanted 04/22/2015 with generator change 09/18/2022     Assessment/Plan:  Recurrent VT / VT storm End stage chronic systolic CHF Continue mexitil 562 mg BID for now. This is the only AAD he has tolerated Continue toprol 25 mg daily as tolerated.  Potassium4.9 (02/03 0449) Magnesium  2.3 (02/03 0449) Creatinine, ser  2.65* (02/03 0449) Keep K > 4.0 and Mg > 2.0 for now. Decision to continue labwork pending  Palliative engagement.   Longstanding persistent AF Previously described as permanent, but has intermittently cardioverted in the setting of HV therapies.  Continue eliquis 2.5 mg BID for now pending palliative conversations. Would continue as long as tolerating orals.   End of Life decision making Goals of Care Pt has exhausted medical therapies is agreeable to de-activation of his tachy therapies and engaging with hospice services. He verbalizes understanding that his likely prognosis is days to weeks, depending on how his body tolerates untreated VT.   Would please admit to medicine to  aggressively manage symptoms and engage palliative services.   For questions or updates, please contact [facility] Please consult [email or web address] for contact info under Cardiology/STEMI.  Dustin Flock, PA-C  05/13/2023 10:13 AM

## 2023-05-13 NOTE — Progress Notes (Signed)
   Full note pending.   Pt is agreeable to ICD de-activation and engaging Hospice services. He verbalizes understanding that he would likely have days to weeks to live in that case.     He thinks family will come around lunch time.   He has AKI and Hyponatremia.  He states he has syncope/near syncope with standing.   Would admit to IM to re-engage hospice services and plan deactivation of ICD therapies.   Pt verbalizes understanding that he has exhausted medical treatment options.   Patient is critically ill and in danger of multiple system organ failure.     Casimiro Needle [address]" [place], [place]  05/13/2023 8:47 AM

## 2023-05-13 NOTE — ED Provider Notes (Signed)
10:11 AM Care of the patient assumed at signout.  Patient has been seen, evaluated by cardiology colleagues.  Plan is for deactivation of his defibrillator, palliative/hospice evaluation.  Patient is aware of, agreeable to plan, voices understanding of change in status, with ongoing discomfort, near syncope, patient will be admitted to internal medicine with palliative care consult which has been placed.   Gerhard Munch, MD 05/13/23 1012

## 2023-05-13 NOTE — ED Triage Notes (Signed)
Pt BIB [facility] from [facility] Around 0250 AM pt was woken up by defibrillator  going off. Defibrillator went off a total of 5 times in about 10-15 minutes. Denies SHOB, n/v/d, or chest pain. A&Ox4, GCS 15  EMS 140/74 BP 98P 97% RA 162 CBG  20 LFA

## 2023-05-13 NOTE — ED Notes (Signed)
 Pacemaker interrogated.

## 2023-05-13 NOTE — ED Notes (Signed)
ED TO INPATIENT HANDOFF REPORT  ED Nurse Name and Phone #: Angelica Chessman, [phone]  S Name/Age/Gender [name] 89 y.o. male Room/Bed: H024C/H024C  Code Status   Code Status: Limited: Do not attempt resuscitation (DNR) -DNR-LIMITED -Do Not Intubate/DNI   Home/SNF/Other Home Patient oriented to: self, place, time, and situation Is this baseline? No   Triage Complete: Triage complete  Chief Complaint Syncope [R55]  Triage Note Pt BIB [facility] from [facility]. Around 0250 AM pt was woken up by defibrillator  going off. Defibrillator went off a total of 5 times in about 10-15 minutes. Denies SHOB, n/v/d, or chest pain. A&Ox4, GCS 15  EMS 140/74 BP 98P 97% RA 162 CBG  20 LFA    Allergies Allergies  Allergen Reactions   Ranolazine Other (See Comments)    States he went into a coma and was in hospital for 3 days after taking   Procaine Swelling and Other (See Comments)   Amiodarone Nausea And Vomiting and Other (See Comments)    Level of Care/Admitting Diagnosis ED Disposition     ED Disposition  Admit   Condition  --   Comment  Hospital Area: [facility] [[id number]]  Level of Care: Med-Surg [16]  May place patient in observation at [facility] or [facility] if equivalent level of care is available:: No  Covid Evaluation: Asymptomatic - no recent exposure (last 10 days) testing not required  Diagnosis: Syncope [206001]  Admitting Physician: Lorin Glass [[id number]]  Attending Physician: Lorin Glass [[id number]]          B Medical/Surgery History Past Medical History:  Diagnosis Date   Arthritis    "minor everywhere" (04/22/2015)   Asthma    "a touch"   Barrett esophagus    CAD (coronary artery disease)    a. s/p 3 vvessel CABG with LIMA to LAD, SVG to diagonal 1, SVG to RCA 12/09.   Cardiac resynchronization therapy defibrillator (CRT-D) in place    Chronic anticoagulation 01/23/2013   Chronic combined systolic and diastolic CHF  (congestive heart failure) (HCC)    dry weight 197-200lbs.   CKD (chronic kidney disease) stage 3, GFR 30-59 ml/min (HCC) 08/23/2014   Complication of anesthesia    DCM (dilated cardiomyopathy) (HCC)    initially concerned for TTP amyloidosis but Tc-PYP scan 06/2017 was not consistent with amyloid and SPEP/UPEP with no M spike. EF 20-25% by echo 2019   Diverticulitis 08/2019   Dyslipidemia 01/23/2013   Essential hypertension 01/23/2013   GERD (gastroesophageal reflux disease)    GI bleed 05/08/2017   H/O: rheumatic fever    Hepatitis 1957   "don't know what kind:   History of hiatal hernia    History of PFTs    a. Amiodarone started 5/16 >> PFTs w/ DLCO 6/16:  FEV1 72% predicted, FEV1/FVC 66%, DLCO 66% >> minimal reversible obstructive airways disease with mild diffusion defect (suggestive of emphysema but absence of hyperinflation inconsistent with dx)   Hyperkalemia 08/23/2014   Hyperthyroidism 05/26/2017   Leg weakness 01/28/2017   Multiple thyroid nodules 08/13/2017   Orthostatic hypotension    Persistent atrial fibrillation (HCC) 01/23/2013   Pleural effusion on right 05/26/2017   Pneumonia 08/2014   "dr thought I may have had a touch"   Pre-diabetes    Protein-calorie malnutrition, severe 05/27/2017   S/P AVR (aortic valve replacement) 2009   a. severe AS s/p AVR with pericardial tissue valve 2009.   Shingles 08/23/2014   VT (ventricular tachycardia) (HCC)  05/06/2017   Past Surgical History:  Procedure Laterality Date   AORTIC VALVE REPLACEMENT  with 23-mm Magna Ease pericardial valve, model number   with 23-mm Magna Ease pericardial valve, model [id number], serial number [id number]    APPENDECTOMY  1940s   BI-VENTRICULAR IMPLANTABLE CARDIOVERTER DEFIBRILLATOR  (CRT-D)  04/22/2015   BIV ICD GENERATOR CHANGEOUT N/A 09/18/2022   Procedure: BIV ICD GENERATOR CHANGEOUT;  Surgeon: Regan Lemming, MD;  Location: [facility] INVASIVE CV LAB;  Service: Cardiovascular;  Laterality: N/A;    CARDIAC CATHETERIZATION N/A 11/04/2014   Procedure: Left Heart Cath and Cors/Grafts Angiography;  Surgeon: Marykay Lex, MD;  Location: [facility] INVASIVE CV LAB;  Service: Cardiovascular;  Laterality: N/A;   CARDIAC VALVE REPLACEMENT     CARDIOVERSION N/A 01/27/2013   Procedure: CARDIOVERSION;  Surgeon: Quintella Reichert, MD;  Location: [facility] ENDOSCOPY;  Service: Cardiovascular;  Laterality: N/A;   CARDIOVERSION N/A 08/25/2014   Procedure: CARDIOVERSION;  Surgeon: Thurmon Fair, MD;  Location: [facility] ENDOSCOPY;  Service: Cardiovascular;  Laterality: N/A;   CARDIOVERSION N/A 05/28/2018   Procedure: CARDIOVERSION;  Surgeon: Chrystie Nose, MD;  Location: [facility] ENDOSCOPY;  Service: Cardiovascular;  Laterality: N/A;   CARDIOVERSION N/A 10/28/2019   Procedure: CARDIOVERSION;  Surgeon: Quintella Reichert, MD;  Location: [facility] ENDOSCOPY;  Service: Cardiovascular;  Laterality: N/A;   CARDIOVERSION N/A 09/01/2020   Procedure: CARDIOVERSION;  Surgeon: Lewayne Bunting, MD;  Location: [facility] ENDOSCOPY;  Service: Cardiovascular;  Laterality: N/A;   CARDIOVERSION N/A 04/04/2022   Procedure: CARDIOVERSION;  Surgeon: Chrystie Nose, MD;  Location: [facility] ENDOSCOPY;  Service: Cardiovascular;  Laterality: N/A;   CATARACT EXTRACTION W/ INTRAOCULAR LENS  IMPLANT, BILATERAL Bilateral    CHEST TUBE INSERTION Right 07/26/2017   Procedure: INSERTION PLEURAL DRAINAGE CATHETER - RIGHT;  Surgeon: Kerin Perna, MD;  Location: [facility] OR;  Service: Thoracic;  Laterality: Right;   CORONARY ARTERY BYPASS GRAFT  03/10/2008   x 3 Dr. Dorris Fetch   EP IMPLANTABLE DEVICE N/A 04/22/2015   Procedure: BiV ICD Insertion CRT-D;  Surgeon: Will Jorja Loa, MD;  Location: [facility] INVASIVE CV LAB;  Service: Cardiovascular;  Laterality: N/A;   ESOPHAGOGASTRODUODENOSCOPY (EGD) WITH ESOPHAGEAL DILATION  X1   ESOPHAGOGASTRODUODENOSCOPY (EGD) WITH PROPOFOL N/A 05/09/2017   Procedure: ESOPHAGOGASTRODUODENOSCOPY (EGD) WITH PROPOFOL;  Surgeon: Jeani Hawking, MD;  Location: [facility]  ENDOSCOPY;  Service: Endoscopy;  Laterality: N/A;   IR THORACENTESIS ASP PLEURAL SPACE W/IMG GUIDE  05/27/2017   IR THORACENTESIS ASP PLEURAL SPACE W/IMG GUIDE  06/17/2017   IR THORACENTESIS ASP PLEURAL SPACE W/IMG GUIDE  06/18/2017   IR THORACENTESIS ASP PLEURAL SPACE W/IMG GUIDE  07/11/2017   REMOVAL OF PLEURAL DRAINAGE CATHETER Right 09/13/2017   Procedure: REMOVAL OF PLEURAL DRAINAGE CATHETER;  Surgeon: Kerin Perna, MD;  Location: [facility] OR;  Service: Thoracic;  Laterality: Right;   RIGHT HEART CATH AND CORONARY/GRAFT ANGIOGRAPHY N/A 05/31/2017   Procedure: RIGHT HEART CATH AND CORONARY/GRAFT ANGIOGRAPHY;  Surgeon: Dolores Patty, MD;  Location: [facility] INVASIVE CV LAB;  Service: Cardiovascular;  Laterality: N/A;   TONSILLECTOMY       A IV Location/Drains/Wounds Patient Lines/Drains/Airways Status     Active Line/Drains/Airways     Name Placement date Placement time Site Days   Peripheral IV 05/13/23 20 G 1" Left;Posterior Forearm 05/13/23  --  Forearm  less than 1            Intake/Output Last 24 hours No intake or output data in the 24 hours ending 05/13/23 1129  Labs/Imaging  Results for orders placed or performed during the hospital encounter of 05/13/23 (from the past 48 hours)  CBC with Differential/Platelet     Status: Abnormal   Collection Time: 05/13/23  4:49 AM  Result Value Ref Range   WBC 11.0 (H) 4.0 - 10.5 K/uL   RBC 4.85 4.22 - 5.81 MIL/uL   Hemoglobin 13.9 13.0 - 17.0 g/dL   HCT 40.9 81.1 - 91.4 %   MCV 89.1 80.0 - 100.0 fL   MCH 28.7 26.0 - 34.0 pg   MCHC 32.2 30.0 - 36.0 g/dL   RDW 78.2 95.6 - 21.3 %   Platelets 266 150 - 400 K/uL   nRBC 0.0 0.0 - 0.2 %   Neutrophils Relative % 71 %   Neutro Abs 8.0 (H) 1.7 - 7.7 K/uL   Lymphocytes Relative 10 %   Lymphs Abs 1.1 0.7 - 4.0 K/uL   Monocytes Relative 16 %   Monocytes Absolute 1.7 (H) 0.1 - 1.0 K/uL   Eosinophils Relative 1 %   Eosinophils Absolute 0.1 0.0 - 0.5 K/uL   Basophils Relative 1 %   Basophils  Absolute 0.1 0.0 - 0.1 K/uL   Immature Granulocytes 1 %   Abs Immature Granulocytes 0.06 0.00 - 0.07 K/uL    Comment: Performed at [facility], [address] [place], [place] [address]  Basic metabolic panel     Status: Abnormal   Collection Time: 05/13/23  4:49 AM  Result Value Ref Range   Sodium 131 (L) 135 - 145 mmol/L   Potassium 4.9 3.5 - 5.1 mmol/L   Chloride 98 98 - 111 mmol/L   CO2 18 (L) 22 - 32 mmol/L   Glucose, Bld 131 (H) 70 - 99 mg/dL    Comment: Glucose reference range applies only to samples taken after fasting for at least 8 hours.   BUN 34 (H) 8 - 23 mg/dL   Creatinine, Ser 8.46 (H) 0.61 - 1.24 mg/dL   Calcium 8.4 (L) 8.9 - 10.3 mg/dL   GFR, Estimated 22 (L) >60 mL/min    Comment: (NOTE) Calculated using the CKD-EPI Creatinine Equation (2021)    Anion gap 15 5 - 15    Comment: Performed at [facility], [address] [place], [place] [address]  Magnesium     Status: None   Collection Time: 05/13/23  4:49 AM  Result Value Ref Range   Magnesium 2.3 1.7 - 2.4 mg/dL    Comment: Performed at [facility], [name] [place], [place] [address]  Troponin I (High Sensitivity)     Status: Abnormal   Collection Time: 05/13/23  4:49 AM  Result Value Ref Range   Troponin I (High Sensitivity) 37 (H) <18 ng/L    Comment: (NOTE) Elevated high sensitivity troponin I (hsTnI) values and significant  changes across serial measurements may suggest ACS but many other  chronic and acute conditions are known to elevate hsTnI results.  Refer to the "Links" section for chest pain algorithms and additional  guidance. Performed at [facility], [address] [place], [place] [address]   Troponin I (High Sensitivity)     Status: Abnormal   Collection Time: 05/13/23  6:37 AM  Result Value Ref Range   Troponin I (High Sensitivity) 50 (H) <18 ng/L    Comment: (NOTE) Elevated high sensitivity troponin I (hsTnI) values and significant  changes across  serial measurements may suggest ACS but many other  chronic and  acute conditions are known to elevate hsTnI results.  Refer to the "Links" section for chest pain algorithms and additional  guidance. Performed at [facility], [address], [place], [place] [address]    DG Chest Port 1 View Result Date: 05/13/2023 CLINICAL DATA:  AICD problem EXAM: PORTABLE CHEST 1 VIEW COMPARISON:  05/09/2023 FINDINGS: Unchanged positioning of biventricular pacer. Stable cardiac enlargement. Prior CABG. Hazy and streaky density in the lower lungs is similar to prior. Small left pleural effusion suggested. No pneumothorax. IMPRESSION: 1. Unchanged infiltrate or edema the lung bases with left pleural effusion. 2. Stable positioning of AICD leads. Electronically Signed   By: Tiburcio Pea M.D.   On: 05/13/2023 05:19    Pending Labs Unresulted Labs (From admission, onward)    None       Vitals/Pain Today's Vitals   05/13/23 0900 05/13/23 0930 05/13/23 1000 05/13/23 1030  BP: 125/71 110/70 115/73 118/75  Pulse: (!) 108 (!) 107 (!) 107 (!) 109  Resp: 17 20 14  (!) 22  Temp:      TempSrc:      SpO2: 95% 97% 97% 97%  Weight:      Height:      PainSc:        Isolation Precautions No active isolations  Medications Medications  polyethylene glycol (MIRALAX / GLYCOLAX) packet 17 g (has no administration in time range)  albuterol (PROVENTIL) (2.5 MG/3ML) 0.083% nebulizer solution 2.5 mg (has no administration in time range)  hydrALAZINE (APRESOLINE) injection 10 mg (has no administration in time range)  prochlorperazine (COMPAZINE) injection 10 mg (has no administration in time range)  atorvastatin (LIPITOR) tablet 40 mg (has no administration in time range)  apixaban (ELIQUIS) tablet 2.5 mg (has no administration in time range)  famotidine (PEPCID) tablet 20 mg (has no administration in time range)  mexiletine (MEXITIL) capsule 250 mg (has no administration in time range)  pantoprazole  (PROTONIX) EC tablet 40 mg (has no administration in time range)  sodium bicarbonate tablet 650 mg (has no administration in time range)  metoprolol succinate (TOPROL-XL) 24 hr tablet 25 mg (has no administration in time range)  acetaminophen (TYLENOL) tablet 500 mg (has no administration in time range)    Mobility walks     Focused Assessments Cardia   R Recommendations: See Admitting Provider Note  Report given to:   Additional Notes: Pt is AO, walky/talky, from home, having pacemaker turned off

## 2023-05-14 DIAGNOSIS — I5042 Chronic combined systolic (congestive) and diastolic (congestive) heart failure: Secondary | ICD-10-CM | POA: Diagnosis present

## 2023-05-14 DIAGNOSIS — Z8249 Family history of ischemic heart disease and other diseases of the circulatory system: Secondary | ICD-10-CM | POA: Diagnosis not present

## 2023-05-14 DIAGNOSIS — I420 Dilated cardiomyopathy: Secondary | ICD-10-CM | POA: Diagnosis present

## 2023-05-14 DIAGNOSIS — N184 Chronic kidney disease, stage 4 (severe): Secondary | ICD-10-CM | POA: Diagnosis present

## 2023-05-14 DIAGNOSIS — I472 Ventricular tachycardia, unspecified: Secondary | ICD-10-CM | POA: Diagnosis present

## 2023-05-14 DIAGNOSIS — E871 Hypo-osmolality and hyponatremia: Secondary | ICD-10-CM | POA: Diagnosis present

## 2023-05-14 DIAGNOSIS — I130 Hypertensive heart and chronic kidney disease with heart failure and stage 1 through stage 4 chronic kidney disease, or unspecified chronic kidney disease: Secondary | ICD-10-CM | POA: Diagnosis present

## 2023-05-14 DIAGNOSIS — Z9842 Cataract extraction status, left eye: Secondary | ICD-10-CM | POA: Diagnosis not present

## 2023-05-14 DIAGNOSIS — Z515 Encounter for palliative care: Secondary | ICD-10-CM | POA: Diagnosis not present

## 2023-05-14 DIAGNOSIS — I5084 End stage heart failure: Secondary | ICD-10-CM | POA: Diagnosis present

## 2023-05-14 DIAGNOSIS — I10 Essential (primary) hypertension: Secondary | ICD-10-CM

## 2023-05-14 DIAGNOSIS — Z7189 Other specified counseling: Secondary | ICD-10-CM

## 2023-05-14 DIAGNOSIS — E785 Hyperlipidemia, unspecified: Secondary | ICD-10-CM | POA: Diagnosis present

## 2023-05-14 DIAGNOSIS — N179 Acute kidney failure, unspecified: Secondary | ICD-10-CM | POA: Diagnosis present

## 2023-05-14 DIAGNOSIS — Z95810 Presence of automatic (implantable) cardiac defibrillator: Secondary | ICD-10-CM | POA: Diagnosis not present

## 2023-05-14 DIAGNOSIS — I429 Cardiomyopathy, unspecified: Secondary | ICD-10-CM | POA: Diagnosis present

## 2023-05-14 DIAGNOSIS — Z66 Do not resuscitate: Secondary | ICD-10-CM | POA: Diagnosis present

## 2023-05-14 DIAGNOSIS — Z953 Presence of xenogenic heart valve: Secondary | ICD-10-CM | POA: Diagnosis not present

## 2023-05-14 DIAGNOSIS — Z961 Presence of intraocular lens: Secondary | ICD-10-CM | POA: Diagnosis present

## 2023-05-14 DIAGNOSIS — Z951 Presence of aortocoronary bypass graft: Secondary | ICD-10-CM | POA: Diagnosis not present

## 2023-05-14 DIAGNOSIS — I2510 Atherosclerotic heart disease of native coronary artery without angina pectoris: Secondary | ICD-10-CM | POA: Diagnosis present

## 2023-05-14 DIAGNOSIS — I4811 Longstanding persistent atrial fibrillation: Secondary | ICD-10-CM | POA: Diagnosis present

## 2023-05-14 DIAGNOSIS — Z9841 Cataract extraction status, right eye: Secondary | ICD-10-CM | POA: Diagnosis not present

## 2023-05-14 DIAGNOSIS — R55 Syncope and collapse: Secondary | ICD-10-CM | POA: Diagnosis present

## 2023-05-14 DIAGNOSIS — Z7901 Long term (current) use of anticoagulants: Secondary | ICD-10-CM | POA: Diagnosis not present

## 2023-05-14 DIAGNOSIS — K219 Gastro-esophageal reflux disease without esophagitis: Secondary | ICD-10-CM | POA: Diagnosis present

## 2023-05-14 LAB — BASIC METABOLIC PANEL
Anion gap: 14 (ref 5–15)
BUN: 39 mg/dL — ABNORMAL HIGH (ref 8–23)
CO2: 18 mmol/L — ABNORMAL LOW (ref 22–32)
Calcium: 8 mg/dL — ABNORMAL LOW (ref 8.9–10.3)
Chloride: 99 mmol/L (ref 98–111)
Creatinine, Ser: 2.51 mg/dL — ABNORMAL HIGH (ref 0.61–1.24)
GFR, Estimated: 24 mL/min — ABNORMAL LOW (ref >60)
Glucose, Bld: 122 mg/dL — ABNORMAL HIGH (ref 70–99)
Potassium: 5.1 mmol/L (ref 3.5–5.1)
Sodium: 131 mmol/L — ABNORMAL LOW (ref 135–145)

## 2023-05-14 MED ORDER — ONDANSETRON HCL 4 MG/2ML IJ SOLN
4.0000 mg | Freq: Four times a day (QID) | INTRAMUSCULAR | Status: DC | PRN
  Administered 2023-05-14: 4 mg via INTRAVENOUS
  Filled 2023-05-14: qty 2

## 2023-05-15 DIAGNOSIS — Z515 Encounter for palliative care: Secondary | ICD-10-CM

## 2023-05-15 DIAGNOSIS — N179 Acute kidney failure, unspecified: Secondary | ICD-10-CM | POA: Insufficient documentation

## 2023-06-08 NOTE — Consult Note (Signed)
 Palliative Medicine Inpatient Consult Note  Consulting Provider: Lesia Ozell Barter, PA-C   Reason for consult:   [facility] Palliative Medicine Consult  Reason for Consult? hospice enrollment   Multiple admissions with refractory VT; Has tried (and tried again) several options.  He has exhausted medical therapies and now understands that. Wants ICD off when disposition decided / family arrives.    May 23, 2023  HPI:  Per intake H&P --> [name] is a 89 y.o. male with PMH significant for HTN, HLD, CAD, A-fib on Eliquis , AS s/p bioprosthetic AVR, systolic CHF, V. Tach, recent VT storm requiring ATP.   The Palliative care team has been asked to assist in goals of care conversations.   Clinical Assessment/Goals of Care:  *Please note that this is a verbal dictation therefore any spelling or grammatical errors are due to the Dragon Medical One system interpretation.  I have reviewed medical records including EPIC notes, labs and imaging, received report from bedside RN, assessed the patient who is lying in bed in NAD.    I met with [name] and called his daughter, [name] to further discuss diagnosis prognosis, GOC, EOL wishes, disposition and options.   I introduced Palliative Medicine as specialized medical care for people living with serious illness. It focuses on providing relief from the symptoms and stress of a serious illness. The goal is to improve quality of life for both the patient and the family.  Medical History Review and Understanding:  A review of [name] past medical history was complete inclusive of arthritis, GERD, Afib, HTN, HLD, CAD, HFrEF s/p CRT-D, Vtach, CKD,and aortic valve replacement.   Social History:  Patient has been married for 60 years to his wife. He has a daughter [name] and 2 sons, Donnice and Francis. His family all lives locally. He is very close to his family. 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 He likes to fix equipment, work on his tractor, and gardening. He previously had a big garden.   Functional and Nutritional State:  [name] was fairly well functioning on discharge. He lives with his wife who mobilizes with a cane. He is able to support [name] and his children take turns checking in on him. Has a fair appetite.   Advance Directives:  A detailed discussion was had today regarding advanced directives. Patients decision makers are his three children.    Code Status:  Concepts specific to code status, artifical feeding and hydration, continued IV antibiotics and rehospitalization was had.  The difference between a aggressive medical intervention path  and a palliative comfort care path for this patient at this time was had.   [name] is an established DNAR/DNI code status.   Discussion:  We discussed patients reasoning for hospitalization inclusive of being sent to the Hospital from [facility] for being shocked. We reviewed that he was then shocked multiple times after transition to the hospital. [name] notes that his device has been deactivated from his recollection.   We reviewed options from her in the setting of [name]' clinical health state. We talked about hospice inclusive of in a hospice home though patient at this moment does not appear to be active in the dying process. [name] notes that he cannot walk anymore. He shares that he is terribly weak and deconditioning now.   [name] himself is agreeable to more of a hospice emphasis of care.   I described hospice as a  service for patients who have a life expectancy of 6 months or less. The goal of hospice is the preservation of dignity and quality at the end phases of life. Under hospice care, the focus changes from curative to symptom relief.   I spoke to patients daughter about comfort measures additionally.   Goals at this time will be to focus on  comfort though continue cardiac medications.   Discussed the importance of continued conversation with family and their  medical providers regarding overall plan of care and treatment options, ensuring decisions are within the context of the patients values and GOCs.  Decision Maker: [name], [name] (Daughter): [phone] (Mobile)   SUMMARY OF RECOMMENDATIONS   DNAR/DNI  Focus on Comfort   AICD has been deactivated - verifying this with nursing staff and have requested formal documentation of this  Appreciate TOC helping with placement options from here - Inpatient hospice and if not accepted back to skilled nursing  Ongoing PMT support  Code Status/Advance Care Planning: DNAR/DNI  Palliative Prophylaxis:  Aspiration, Bowel Regimen, Delirium Protocol, Frequent Pain Assessment, Oral Care, Palliative Wound Care, and Turn Reposition  Additional Recommendations (Limitations, Scope, Preferences): Continue current care  Psycho-social/Spiritual:  Desire for further Chaplaincy support:Yes  Additional Recommendations: Education on EOL processes   Prognosis: Limited overall in the setting of VT storm and now deactivated AICD  Discharge Planning: Discharge plan to be determined - likely inpatient hospice home.   Vitals:   05/13/23 2147 06/02/2023 0631  BP: 98/63 113/68  Pulse:    Resp: 17 17  Temp: 97.9 F (36.6 C) 97.9 F (36.6 C)  SpO2: 94% 98%    Intake/Output Summary (Last 24 hours) at Jun 02, 2023 9052 Last data filed at 06-02-2023 0600 Gross per 24 hour  Intake --  Output 350 ml  Net -350 ml   Last Weight  Most recent update: 05/13/2023  4:36 AM    Weight  82.6 kg (182 lb)            Gen:  Elderly Caucasian M, chronically ill appearing HEENT: moist mucous membranes CV: Regular rate and irregular rhythm  PULM:  On RA, appears short of breath ABD: Not distended EXT: No edema  Neuro: Alert and oriented x3 - hard of hearing  PPS: 30%   This conversation/these  recommendations were discussed with patient primary care team, Dr. Briana  Billing based on MDM: High ______________________________________________________ [name] [facility] Palliative Medicine Team Team Cell Phone: [phone] Please utilize secure chat with additional questions, if there is no response within 30 minutes please call the above phone number  Palliative Medicine Team providers are available by phone from 7am to 7pm daily and can be reached through the team cell phone.  Should this patient require assistance outside of these hours, please call the patient's attending physician.

## 2023-06-08 NOTE — Death Summary Note (Signed)
   DEATH SUMMARY   Patient Details  Name: [name] MRN: [id number] DOB: 18-Oct-1932 ERE:Fzbzmd, Garnette, MD Admission/Discharge Information   Admit Date:  27-May-2023  Date of Death: Date of Death: 28-May-2023  Time of Death: Time of Death: 03/29/99  Length of Stay: 1   Principle Cause of death: Ventricular tachycardia  Hospital Diagnoses: Principal Problem:   Syncope Active Problems:   Essential hypertension   Stage 4 chronic kidney disease (HCC)   Chronic combined systolic (congestive) and diastolic (congestive) heart failure (HCC)   Cardiac resynchronization therapy defibrillator (CRT-D) in place   Ventricular tachycardia (HCC)   Comfort measures only status   AKI (acute kidney injury) [facility])   Hospital Course: [name] is a 89 y.o. male with a history of hypertension, CAD, hyperlipidemia, atrial fibrillation, aortic stenosis s/p bioprosthetic AVR, HFrEF, ventricular tachycardia.  Patient presented secondary to syncope with evidence of multiple defibrillator firings. Upon admission, decision made to deactivate defibrillator and transition to hospice care.  Assessment and Plan:  Syncope Present prior to admission. Presumed secondary to VT. Patient is transitioned to hospice care.   Recurrent VT storm Patient with an AICD prior to admission. EP consulted. Decision made to deactivate defibrillator and to transition to hospice.   Persistent atrial fibrillation Continued Eliquis  and metoprolol  succinate   Chronic heart failure with reduced EF LVEF of 20-25% from last Transthoracic Echocardiogram. Continued Toprol  XL   Primary hypertension Continued Toprol  XL   CAD Hyperlipidemia Atorvastatin  discontinued secondary to goals of care.   AKI on CKD stage IV Noted on admission. Secondary to poor oral intake. Patient is now transitioned to hospice.   GERD Continued Protonix    Goals of care Patient has elected hospice care. Attempts were made to discharge to  hospital facility vs back to nursing facility with hospice.  Procedures: Defibrillator deactivation  Consultations: Cardiology, Palliative care medicine  The results of significant diagnostics from this hospitalization (including imaging, microbiology, ancillary and laboratory) are listed below for reference.   Significant Diagnostic Studies: DG Chest Port 1 View Result Date: May 27, 2023 CLINICAL DATA:  AICD problem EXAM: PORTABLE CHEST 1 VIEW COMPARISON:  05/09/2023 FINDINGS: Unchanged positioning of biventricular pacer. Stable cardiac enlargement. Prior CABG. Hazy and streaky density in the lower lungs is similar to prior. Small left pleural effusion suggested. No pneumothorax. IMPRESSION: 1. Unchanged infiltrate or edema the lung bases with left pleural effusion. 2. Stable positioning of AICD leads. Electronically Signed   By: Dorn Roulette M.D.   On: 05/27/23 05:19   DG Chest Portable 1 View Result Date: 05/09/2023 CLINICAL DATA:  Cough. EXAM: PORTABLE CHEST 1 VIEW COMPARISON:  April 12, 2023. FINDINGS: Stable cardiomediastinal silhouette. Left-sided defibrillator is unchanged. Status post coronary bypass graft. Mild bibasal atelectasis or edema is noted with small left pleural effusion. Bony thorax unremarkable. IMPRESSION: Mild bibasilar atelectasis or edema is noted with small left pleural effusion. Electronically Signed   By: [name] Landy Raddle M.D.   On: 05/09/2023 11:19    Signed: Elgin Lam, MD 05/15/23

## 2023-06-08 NOTE — Progress Notes (Addendum)
 [facility] 6N10 [facility] Liaison Note  Received request from Harlene Sharps with TOC and [name], NP with PMT for family interest in [facility].   Met with son, Adina at bedside and had phone conversation with daughter, [name] about inpatient unit facility and care delivery model. Concern for needed secondary plan of care if patient does not decline or pass at [facility].   Explored return to [facility] with hospice; family recognizes this would be private pay; trying to determine if this is an option; they are awaiting phone call back from facility to see if room is still available.  Gentle inquiry into discontinuation of cardiac meds, family expresses patient will die if they are discontinued. Further exploration of time limited continuation. Family is not ready at this time to make this decision.  Adina will talk with other sibling and [name], continue to seek information on bed availability at [facility] and will follow up with me.  Heather Stephane and Harlene Sharps, updated on unit. Team notified by [employer].  Please call with any hospice related questions or concerns.  Will continue to follow.  1503 Addendum: Received message that family would like to return back to [facility] with the support of hospice services. Will follow for discharge date.  Thank you, Randine Nail, BSN, RN [phone]

## 2023-06-08 NOTE — Plan of Care (Signed)

## 2023-06-08 NOTE — Progress Notes (Addendum)
Spoke with Shari Prows, a representative with [employer], and he verified that the patient's AICD was deactivated yesterday 05/13/23.  Tobie Lords [phone]

## 2023-06-08 NOTE — Progress Notes (Signed)
    OVERNIGHT PROGRESS REPORT  Notified by RN that patient is deceased as of    04-02-99 Hrs.  Patient was DNR/DNI followed by Palliative.  2 RN verified.  Family was available to RN by phone .     [name] Kipper MSNA MSN ACNPC-AG Acute Care Nurse Practitioner [facility]

## 2023-06-08 NOTE — Progress Notes (Signed)
   CC:  Chief Complaint  Patient presents with   AICD Problem      Subjective:  No concerns this morning except he is unclear of the plan. Waiting for cardiology and palliative care discussions.  Objective:  BP 113/67 (BP Location: Right Arm)   Pulse (!) 109   Temp (!) 97.5 F (36.4 C) (Oral)   Resp 18   Ht 6' (1.829 m)   Wt 82.6 kg   SpO2 96%   BMI 24.68 kg/m   General exam: Appears calm and comfortable Respiratory system: Clear to auscultation. Respiratory effort normal. Cardiovascular system: S1 & S2 heard, irregular. Gastrointestinal system: Abdomen is nondistended, soft and nontender Normal bowel sounds heard. Central nervous system: Alert and oriented. No focal neurological deficits. Psychiatry: Judgement and insight appear normal. Mood & affect appropriate.   Assessment/Plan:  Syncope Present prior to admission. Presumed secondary to VT. Patient is transitioned to hospice care.  Recurrent VT storm Patient with an AICD prior to admission. EP consulted. Decision made to deactivate defibrillator and to transition to hospice. -Continue Mexitil  and metoprolol   Persistent atrial fibrillation -Continue Eliquis  and metoprolol  succinate  Chronic heart failure with reduced EF LVEF of 20-25% from last Transthoracic Echocardiogram. -Continue Toprol  XL  Primary hypertension -Continue Toprol  XL -Continue hydralazine  PRN  CAD Hyperlipidemia Atorvastatin  discontinued secondary to goals of care.  AKI on CKD stage IV Noted on admission. Secondary to poor oral intake. Patient is now transitioned to hospice. Will manage with PO intake as needed.  GERD -Continue Protonix   Goals of care Patient has elected hospice care. Attempts to discharge to hospital facility vs back to nursing facility with hospice.  Elgin Lam, MD [facility] 05/30/23, 10:14 AM

## 2023-06-08 NOTE — TOC Progression Note (Signed)
 Transition of Care [facility]) - Progression Note    Patient Details  Name: [name] MRN: [id number] Date of Birth: 1932-10-07  Transition [facility] Care [facility]) CM/SW Contact  Harlene Sharps, LCSW Phone Number: 2023-05-25, 4:01 PM  Clinical Narrative:     CSW advised family would like for pt to return to [place] with LTC and Hospice. CSW spoke with [facility] rep who noted they will have a room for pt, and will follow up with CSW in the morning after they have gotten everything they need from family. CSW spoke with dtr [name] who is agreeable with plan and for [facility] to follow for Hospice at the facility. TOC will continue to follow.   Expected Discharge Plan: Hospice Medical Facility Barriers to Discharge: Continued Medical Work up  Expected Discharge Plan and Services   Discharge Planning Services: CM Consult Post Acute Care Choice: Hospice Living arrangements for the past 2 months: Skilled Nursing Facility ([facility] for rehab)                 DME Arranged: N/A DME Agency: NA       HH Arranged: NA HH Agency: NA         Social Determinants of Health (SDOH) Interventions SDOH Screenings   Food Insecurity: Patient Declined (05/13/2023)  Housing: Patient Declined (05/13/2023)  Transportation Needs: Patient Declined (05/13/2023)  Utilities: Patient Declined (05/13/2023)  Social Connections: Moderately Isolated (05/13/2023)  Tobacco Use: Low Risk  (05/13/2023)    Readmission Risk Interventions     No data to display

## 2023-06-08 NOTE — TOC Initial Note (Addendum)
 Transition of Care (TOC) - Initial/Assessment Note   Received secure chat from Palliative Medicine Team, they spoke with daughter and plan is for residential hospice. First choice [facility]) , second choice [facility]. Discussed with patient . Patient is in agreement with whatever my daughter wants. NCM offered to call his daughter and place on speaker and discuss. Patient does not wish for NCM to call daughter because she is at work. Patient did consent to referral to [facility] and if not accepted a referral to [facility].   Referral given to [facility] with [facility]. Await determination    Patient from [facility] for SNF rehab  Patient Details  Name: [name] MRN: [id number] Date of Birth: [birth year]  Transition of Care [facility]) CM/SW Contact:    Stephane Powell Jansky, RN Phone Number: 05/23/2023, 10:13 AM  Clinical Narrative:                   Expected Discharge Plan: Hospice Medical Facility Barriers to Discharge: Continued Medical Work up   Patient Goals and CMS Choice Patient states their goals for this hospitalization and ongoing recovery are:: comfort CMS Medicare.gov Compare Post Acute Care list provided to:: Patient Choice offered to / list presented to : Patient      Expected Discharge Plan and Services   Discharge Planning Services: CM Consult Post Acute Care Choice: Hospice Living arrangements for the past 2 months: Skilled Nursing Facility ([facility] for rehab)                 DME Arranged: N/A DME Agency: NA       HH Arranged: NA HH Agency: NA        Prior Living Arrangements/Services Living arrangements for the past 2 months: Skilled Nursing Facility ([facility] for rehab)   Patient language and need for interpreter reviewed:: Yes Do you feel safe going back to the place where you live?: Yes      Need for Family Participation in Patient Care: Yes (Comment) Care giver support system in place?: Yes  (comment) Current home services: DME Criminal Activity/Legal Involvement Pertinent to Current Situation/Hospitalization: No - Comment as needed  Activities of Daily Living   ADL Screening (condition at time of admission) Independently performs ADLs?: No Does the patient have a NEW difficulty with bathing/dressing/toileting/self-feeding that is expected to last >3 days?: No (needs assist) Does the patient have a NEW difficulty with getting in/out of bed, walking, or climbing stairs that is expected to last >3 days?: No (needs assist) Does the patient have a NEW difficulty with communication that is expected to last >3 days?: No Is the patient deaf or have difficulty hearing?: Yes Does the patient have difficulty seeing, even when wearing glasses/contacts?: No Does the patient have difficulty concentrating, remembering, or making decisions?: No  Permission Sought/Granted   Permission granted to share information with : Yes, Verbal Permission Granted     Permission granted to share info w AGENCY: [facility] [employer] [facility] [employer] , and [facility]        Emotional Assessment Appearance:: Appears stated age Attitude/Demeanor/Rapport: Engaged Affect (typically observed): Accepting Orientation: : Oriented to Self, Oriented to Place, Oriented to  Time, Oriented to Situation Alcohol / Substance Use: Not Applicable Psych Involvement: No (comment)  Admission diagnosis:  Syncope [R55] [facility]) [I47.20] Patient Active Problem List   Diagnosis Date Noted   Syncope 05/13/2023   Palliative care by specialist 04/22/2023   Ventricular tachycardia (  HCC) 04/12/2023   Hypercoagulable state due to persistent atrial fibrillation (HCC) 07/19/2022   Orthostatic hypotension 09/11/2019   Ileus (HCC) 09/04/2019   Hyponatremia 09/03/2019   Metabolic acidosis 09/03/2019   Diverticulitis 09/02/2019   Acute urinary retention 09/02/2019   Lower extremity weakness 11/29/2018   Multiple thyroid  nodules  08/13/2017   DCM (dilated cardiomyopathy) (HCC) 06/16/2017   History of aortic valve replacement with bioprosthetic valve    Cardiac resynchronization therapy defibrillator (CRT-D) in place    Protein-calorie malnutrition, severe 05/27/2017   Hyperthyroidism 05/26/2017   Cough 05/26/2017   Pleural effusion on right 05/26/2017   Moderate malnutrition (HCC) 05/09/2017   GI bleed 05/08/2017   Ventricular tachycardia, sustained (HCC) 05/06/2017   Dizziness 04/23/2017   Weight loss 04/23/2017   Leg weakness 01/28/2017   Chronic combined systolic (congestive) and diastolic (congestive) heart failure (HCC)    Shingles 08/23/2014   Stage 4 chronic kidney disease (HCC) 08/23/2014   Hyperkalemia 08/23/2014   Essential hypertension 01/23/2013   Dyslipidemia 01/23/2013   Coronary artery disease 01/23/2013   Persistent atrial fibrillation (HCC) 01/23/2013   Chronic anticoagulation 01/23/2013   PCP:  Nanci Senior, MD Pharmacy:   [facility] [phone] - [place], [place] - [address] [place] [address] Phone: [phone] Fax: [phone]  [facility] - [place], [place] - [address] [place] [address] Phone: [phone] Fax: [phone]  [facility] [address] [place] [address] Phone: [phone] Fax: [phone]     Social Drivers of Health (SDOH) Social History: SDOH Screenings   Food Insecurity: Patient Declined (05/13/2023)  Housing: Patient Declined (05/13/2023)  Transportation Needs: Patient Declined (05/13/2023)  Utilities: Patient Declined (05/13/2023)  Social Connections: Moderately Isolated (05/13/2023)  Tobacco Use: Low Risk  (05/13/2023)   SDOH Interventions:     Readmission Risk Interventions     No data to display

## 2023-06-08 NOTE — Care Management Obs Status (Signed)
MEDICARE OBSERVATION STATUS NOTIFICATION   Patient Details  Name: [name] MRN: [id number] Date of Birth: 23-Jun-1932   Medicare Observation Status Notification Given:  Yes    Kingsley Plan, RN 06/02/2023, 10:07 AM

## 2023-06-08 NOTE — Hospital Course (Signed)
 [name] is a 89 y.o. male with a history of hypertension, CAD, hyperlipidemia, atrial fibrillation, aortic stenosis s/p bioprosthetic AVR, HFrEF, ventricular tachycardia.  Patient presented secondary to syncope with evidence of multiple defibrillator firings. Upon admission, decision made to deactivate defibrillator and transition to hospice care.

## 2023-06-08 NOTE — Progress Notes (Signed)
 Patient on comfort care during rounding was found unresponsive,no movement of chest. On auscultation heart beat was absent, pupils were checked and found fixed and dilated physician was notified, patient's daughter was called and pronounced  death at 03/29/99 by two RN.[place] donor service notified and patient's family notified. No belongings seen inside the room.

## 2023-06-08 DEATH — deceased
